# Patient Record
Sex: Female | Born: 1975
Health system: Southern US, Community
[De-identification: ages and names within clinical notes are randomized; demographics above are authoritative.]

## PROBLEM LIST (undated history)

## (undated) DIAGNOSIS — R51 Headache: Secondary | ICD-10-CM

## (undated) DIAGNOSIS — R404 Transient alteration of awareness: Secondary | ICD-10-CM

## (undated) DIAGNOSIS — R519 Headache, unspecified: Secondary | ICD-10-CM

## (undated) DIAGNOSIS — G4730 Sleep apnea, unspecified: Secondary | ICD-10-CM

## (undated) DIAGNOSIS — M797 Fibromyalgia: Secondary | ICD-10-CM

## (undated) DIAGNOSIS — G2569 Other tics of organic origin: Secondary | ICD-10-CM

## (undated) DIAGNOSIS — K635 Polyp of colon: Secondary | ICD-10-CM

## (undated) DIAGNOSIS — Q796 Ehlers-Danlos syndrome, unspecified: Secondary | ICD-10-CM

## (undated) HISTORY — DX: Transient alteration of awareness: R40.4

## (undated) HISTORY — DX: Polyp of colon: K63.5

## (undated) HISTORY — DX: Other tics of organic origin: G25.69

## (undated) HISTORY — DX: Headache, unspecified: R51.9

## (undated) HISTORY — PX: OTHER SURGICAL HISTORY: SHX169

## (undated) HISTORY — DX: Headache: R51

## (undated) HISTORY — PX: TONSILLECTOMY: SUR1361

## (undated) HISTORY — DX: Ehlers-Danlos syndrome, unspecified: Q79.60

---
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
## 1996-08-15 HISTORY — PX: OTHER SURGICAL HISTORY: SHX169

## 1996-08-15 HISTORY — PX: MANDIBLE SURGERY: SHX707

## 1997-12-12 ENCOUNTER — Emergency Department (HOSPITAL_COMMUNITY): Admission: EM | Admit: 1997-12-12 | Discharge: 1997-12-12 | Payer: Self-pay | Admitting: Emergency Medicine

## 1997-12-13 ENCOUNTER — Emergency Department (HOSPITAL_COMMUNITY): Admission: EM | Admit: 1997-12-13 | Discharge: 1997-12-13 | Payer: Self-pay | Admitting: Emergency Medicine

## 1998-04-14 ENCOUNTER — Emergency Department (HOSPITAL_COMMUNITY): Admission: EM | Admit: 1998-04-14 | Discharge: 1998-04-14 | Payer: Self-pay | Admitting: Emergency Medicine

## 1999-07-21 ENCOUNTER — Emergency Department (HOSPITAL_COMMUNITY): Admission: EM | Admit: 1999-07-21 | Discharge: 1999-07-21 | Payer: Self-pay | Admitting: *Deleted

## 1999-10-04 ENCOUNTER — Emergency Department (HOSPITAL_COMMUNITY): Admission: EM | Admit: 1999-10-04 | Discharge: 1999-10-04 | Payer: Self-pay | Admitting: Emergency Medicine

## 2000-03-16 ENCOUNTER — Emergency Department (HOSPITAL_COMMUNITY): Admission: EM | Admit: 2000-03-16 | Discharge: 2000-03-16 | Payer: Self-pay

## 2000-03-17 ENCOUNTER — Other Ambulatory Visit: Admission: RE | Admit: 2000-03-17 | Discharge: 2000-03-30 | Payer: Self-pay

## 2000-04-14 ENCOUNTER — Encounter: Payer: Self-pay | Admitting: Emergency Medicine

## 2000-04-14 ENCOUNTER — Encounter: Payer: Self-pay | Admitting: Family Medicine

## 2000-04-14 ENCOUNTER — Emergency Department (HOSPITAL_COMMUNITY): Admission: EM | Admit: 2000-04-14 | Discharge: 2000-04-14 | Payer: Self-pay | Admitting: Emergency Medicine

## 2000-06-15 ENCOUNTER — Encounter: Payer: Self-pay | Admitting: *Deleted

## 2000-06-15 ENCOUNTER — Ambulatory Visit (HOSPITAL_COMMUNITY): Admission: RE | Admit: 2000-06-15 | Discharge: 2000-06-15 | Payer: Self-pay | Admitting: *Deleted

## 2000-06-27 ENCOUNTER — Emergency Department (HOSPITAL_COMMUNITY): Admission: EM | Admit: 2000-06-27 | Discharge: 2000-06-27 | Payer: Self-pay | Admitting: Emergency Medicine

## 2001-06-24 ENCOUNTER — Emergency Department (HOSPITAL_COMMUNITY): Admission: EM | Admit: 2001-06-24 | Discharge: 2001-06-25 | Payer: Self-pay | Admitting: *Deleted

## 2001-06-24 ENCOUNTER — Encounter: Payer: Self-pay | Admitting: Emergency Medicine

## 2001-06-26 ENCOUNTER — Encounter: Admission: RE | Admit: 2001-06-26 | Discharge: 2001-06-26 | Payer: Self-pay | Admitting: Pediatrics

## 2001-06-26 ENCOUNTER — Encounter: Payer: Self-pay | Admitting: Pediatrics

## 2001-06-27 ENCOUNTER — Emergency Department (HOSPITAL_COMMUNITY): Admission: EM | Admit: 2001-06-27 | Discharge: 2001-06-27 | Payer: Self-pay | Admitting: Emergency Medicine

## 2001-07-05 ENCOUNTER — Encounter: Admission: RE | Admit: 2001-07-05 | Discharge: 2001-07-05 | Payer: Self-pay | Admitting: Pediatrics

## 2001-07-05 ENCOUNTER — Encounter: Payer: Self-pay | Admitting: Pediatrics

## 2001-08-15 HISTORY — PX: LAPAROSCOPY: SHX197

## 2001-11-12 ENCOUNTER — Encounter: Payer: Self-pay | Admitting: Internal Medicine

## 2001-11-12 ENCOUNTER — Inpatient Hospital Stay (HOSPITAL_COMMUNITY): Admission: AD | Admit: 2001-11-12 | Discharge: 2001-11-23 | Payer: Self-pay | Admitting: Internal Medicine

## 2001-11-14 ENCOUNTER — Encounter: Payer: Self-pay | Admitting: Internal Medicine

## 2001-11-14 ENCOUNTER — Encounter: Payer: Self-pay | Admitting: Gastroenterology

## 2002-08-23 ENCOUNTER — Ambulatory Visit (HOSPITAL_COMMUNITY): Admission: RE | Admit: 2002-08-23 | Discharge: 2002-08-23 | Payer: Self-pay | Admitting: Gastroenterology

## 2002-12-13 HISTORY — PX: TONSILLECTOMY: SHX5217

## 2003-11-11 ENCOUNTER — Other Ambulatory Visit: Payer: Self-pay

## 2005-01-12 ENCOUNTER — Emergency Department: Payer: Self-pay | Admitting: Unknown Physician Specialty

## 2005-01-12 ENCOUNTER — Other Ambulatory Visit: Payer: Self-pay

## 2005-01-13 ENCOUNTER — Other Ambulatory Visit: Payer: Self-pay

## 2005-06-13 ENCOUNTER — Ambulatory Visit: Payer: Self-pay | Admitting: Unknown Physician Specialty

## 2005-08-03 ENCOUNTER — Ambulatory Visit: Payer: Self-pay | Admitting: Unknown Physician Specialty

## 2006-11-07 ENCOUNTER — Ambulatory Visit: Payer: Self-pay

## 2007-08-16 HISTORY — PX: DILATION AND CURETTAGE OF UTERUS: SHX78

## 2007-09-21 ENCOUNTER — Ambulatory Visit: Payer: Self-pay | Admitting: Obstetrics and Gynecology

## 2007-10-26 ENCOUNTER — Ambulatory Visit: Payer: Self-pay | Admitting: Obstetrics and Gynecology

## 2007-11-01 ENCOUNTER — Ambulatory Visit: Payer: Self-pay | Admitting: Obstetrics and Gynecology

## 2007-11-09 ENCOUNTER — Ambulatory Visit: Payer: Self-pay | Admitting: Obstetrics and Gynecology

## 2008-01-07 ENCOUNTER — Encounter: Payer: Self-pay | Admitting: Obstetrics and Gynecology

## 2008-01-14 ENCOUNTER — Encounter: Payer: Self-pay | Admitting: Obstetrics and Gynecology

## 2008-02-13 ENCOUNTER — Encounter: Payer: Self-pay | Admitting: Obstetrics and Gynecology

## 2008-03-15 ENCOUNTER — Encounter: Payer: Self-pay | Admitting: Obstetrics and Gynecology

## 2008-04-15 ENCOUNTER — Encounter: Payer: Self-pay | Admitting: Obstetrics and Gynecology

## 2008-10-07 ENCOUNTER — Ambulatory Visit: Payer: Self-pay

## 2008-12-31 ENCOUNTER — Ambulatory Visit: Payer: Self-pay | Admitting: Physical Medicine and Rehabilitation

## 2009-01-13 ENCOUNTER — Ambulatory Visit: Payer: Self-pay | Admitting: Internal Medicine

## 2009-01-28 ENCOUNTER — Ambulatory Visit: Payer: Self-pay | Admitting: Internal Medicine

## 2009-02-12 ENCOUNTER — Ambulatory Visit: Payer: Self-pay | Admitting: Internal Medicine

## 2009-06-25 ENCOUNTER — Encounter: Payer: Self-pay | Admitting: Family Medicine

## 2009-07-15 ENCOUNTER — Encounter: Payer: Self-pay | Admitting: Family Medicine

## 2009-08-15 ENCOUNTER — Encounter: Payer: Self-pay | Admitting: Family Medicine

## 2009-09-15 ENCOUNTER — Encounter: Payer: Self-pay | Admitting: Family Medicine

## 2010-04-15 ENCOUNTER — Ambulatory Visit: Payer: Self-pay | Admitting: Internal Medicine

## 2010-04-26 ENCOUNTER — Ambulatory Visit: Payer: Self-pay | Admitting: Internal Medicine

## 2010-05-15 ENCOUNTER — Ambulatory Visit: Payer: Self-pay | Admitting: Internal Medicine

## 2010-06-04 ENCOUNTER — Emergency Department: Payer: Self-pay | Admitting: Emergency Medicine

## 2010-06-09 ENCOUNTER — Ambulatory Visit: Payer: Self-pay | Admitting: Internal Medicine

## 2010-06-15 ENCOUNTER — Ambulatory Visit: Payer: Self-pay | Admitting: Internal Medicine

## 2010-07-06 ENCOUNTER — Ambulatory Visit: Payer: Self-pay | Admitting: Gastroenterology

## 2010-07-06 LAB — HM COLONOSCOPY: HM Colonoscopy: NORMAL

## 2010-07-09 LAB — PATHOLOGY REPORT

## 2010-07-21 ENCOUNTER — Ambulatory Visit: Payer: Self-pay | Admitting: Internal Medicine

## 2010-08-15 ENCOUNTER — Ambulatory Visit: Payer: Self-pay | Admitting: Internal Medicine

## 2010-09-15 ENCOUNTER — Ambulatory Visit: Payer: Self-pay | Admitting: Internal Medicine

## 2011-10-25 DIAGNOSIS — J019 Acute sinusitis, unspecified: Secondary | ICD-10-CM | POA: Diagnosis not present

## 2011-10-25 DIAGNOSIS — J3089 Other allergic rhinitis: Secondary | ICD-10-CM | POA: Diagnosis not present

## 2011-10-25 DIAGNOSIS — H6500 Acute serous otitis media, unspecified ear: Secondary | ICD-10-CM | POA: Diagnosis not present

## 2011-11-16 DIAGNOSIS — R112 Nausea with vomiting, unspecified: Secondary | ICD-10-CM | POA: Diagnosis not present

## 2011-11-16 DIAGNOSIS — Z87820 Personal history of traumatic brain injury: Secondary | ICD-10-CM | POA: Diagnosis not present

## 2011-11-16 DIAGNOSIS — M545 Low back pain, unspecified: Secondary | ICD-10-CM | POA: Diagnosis not present

## 2011-11-16 DIAGNOSIS — G894 Chronic pain syndrome: Secondary | ICD-10-CM | POA: Diagnosis not present

## 2011-11-16 DIAGNOSIS — Q796 Ehlers-Danlos syndrome, unspecified: Secondary | ICD-10-CM | POA: Diagnosis not present

## 2011-11-16 DIAGNOSIS — IMO0001 Reserved for inherently not codable concepts without codable children: Secondary | ICD-10-CM | POA: Diagnosis not present

## 2011-11-16 DIAGNOSIS — J42 Unspecified chronic bronchitis: Secondary | ICD-10-CM | POA: Diagnosis not present

## 2012-01-20 DIAGNOSIS — J060 Acute laryngopharyngitis: Secondary | ICD-10-CM | POA: Diagnosis not present

## 2012-01-20 DIAGNOSIS — R51 Headache: Secondary | ICD-10-CM | POA: Diagnosis not present

## 2012-01-20 DIAGNOSIS — J029 Acute pharyngitis, unspecified: Secondary | ICD-10-CM | POA: Diagnosis not present

## 2012-03-11 DIAGNOSIS — G8929 Other chronic pain: Secondary | ICD-10-CM | POA: Diagnosis not present

## 2012-03-11 DIAGNOSIS — M545 Low back pain, unspecified: Secondary | ICD-10-CM | POA: Diagnosis not present

## 2012-03-11 DIAGNOSIS — M549 Dorsalgia, unspecified: Secondary | ICD-10-CM | POA: Diagnosis not present

## 2012-03-11 DIAGNOSIS — S335XXA Sprain of ligaments of lumbar spine, initial encounter: Secondary | ICD-10-CM | POA: Diagnosis not present

## 2012-03-11 DIAGNOSIS — S239XXA Sprain of unspecified parts of thorax, initial encounter: Secondary | ICD-10-CM | POA: Diagnosis not present

## 2012-03-12 DIAGNOSIS — M549 Dorsalgia, unspecified: Secondary | ICD-10-CM | POA: Diagnosis not present

## 2012-03-12 DIAGNOSIS — M545 Low back pain, unspecified: Secondary | ICD-10-CM | POA: Diagnosis not present

## 2012-05-03 DIAGNOSIS — Q796 Ehlers-Danlos syndrome, unspecified: Secondary | ICD-10-CM | POA: Diagnosis not present

## 2012-05-03 DIAGNOSIS — IMO0001 Reserved for inherently not codable concepts without codable children: Secondary | ICD-10-CM | POA: Diagnosis not present

## 2012-06-14 DIAGNOSIS — J45909 Unspecified asthma, uncomplicated: Secondary | ICD-10-CM | POA: Diagnosis not present

## 2012-06-14 DIAGNOSIS — Z23 Encounter for immunization: Secondary | ICD-10-CM | POA: Diagnosis not present

## 2012-06-14 DIAGNOSIS — B3731 Acute candidiasis of vulva and vagina: Secondary | ICD-10-CM | POA: Diagnosis not present

## 2012-06-14 DIAGNOSIS — B373 Candidiasis of vulva and vagina: Secondary | ICD-10-CM | POA: Diagnosis not present

## 2012-06-14 DIAGNOSIS — M549 Dorsalgia, unspecified: Secondary | ICD-10-CM | POA: Diagnosis not present

## 2012-06-29 DIAGNOSIS — M47817 Spondylosis without myelopathy or radiculopathy, lumbosacral region: Secondary | ICD-10-CM | POA: Diagnosis not present

## 2012-07-04 DIAGNOSIS — H53029 Refractive amblyopia, unspecified eye: Secondary | ICD-10-CM | POA: Diagnosis not present

## 2012-07-19 DIAGNOSIS — M5137 Other intervertebral disc degeneration, lumbosacral region: Secondary | ICD-10-CM | POA: Diagnosis not present

## 2012-07-19 DIAGNOSIS — M47817 Spondylosis without myelopathy or radiculopathy, lumbosacral region: Secondary | ICD-10-CM | POA: Diagnosis not present

## 2012-08-04 DIAGNOSIS — J019 Acute sinusitis, unspecified: Secondary | ICD-10-CM | POA: Diagnosis not present

## 2012-08-04 DIAGNOSIS — J309 Allergic rhinitis, unspecified: Secondary | ICD-10-CM | POA: Diagnosis not present

## 2012-08-04 DIAGNOSIS — Z23 Encounter for immunization: Secondary | ICD-10-CM | POA: Diagnosis not present

## 2012-08-30 DIAGNOSIS — M479 Spondylosis, unspecified: Secondary | ICD-10-CM | POA: Diagnosis not present

## 2012-09-10 ENCOUNTER — Ambulatory Visit: Payer: Self-pay | Admitting: Pain Medicine

## 2012-09-10 DIAGNOSIS — G894 Chronic pain syndrome: Secondary | ICD-10-CM | POA: Diagnosis not present

## 2012-09-10 DIAGNOSIS — Z79899 Other long term (current) drug therapy: Secondary | ICD-10-CM | POA: Diagnosis not present

## 2012-09-10 DIAGNOSIS — F449 Dissociative and conversion disorder, unspecified: Secondary | ICD-10-CM | POA: Diagnosis not present

## 2012-09-18 ENCOUNTER — Ambulatory Visit: Payer: Self-pay | Admitting: Pain Medicine

## 2012-09-20 ENCOUNTER — Ambulatory Visit: Payer: Self-pay | Admitting: Pain Medicine

## 2012-10-09 ENCOUNTER — Emergency Department: Payer: Self-pay | Admitting: Emergency Medicine

## 2012-10-17 DIAGNOSIS — G4700 Insomnia, unspecified: Secondary | ICD-10-CM | POA: Diagnosis not present

## 2012-10-17 DIAGNOSIS — R109 Unspecified abdominal pain: Secondary | ICD-10-CM | POA: Diagnosis not present

## 2012-10-17 DIAGNOSIS — Z23 Encounter for immunization: Secondary | ICD-10-CM | POA: Diagnosis not present

## 2012-10-24 DIAGNOSIS — R109 Unspecified abdominal pain: Secondary | ICD-10-CM | POA: Diagnosis not present

## 2012-11-01 DIAGNOSIS — IMO0002 Reserved for concepts with insufficient information to code with codable children: Secondary | ICD-10-CM | POA: Diagnosis not present

## 2012-11-05 ENCOUNTER — Emergency Department: Payer: Self-pay | Admitting: Emergency Medicine

## 2012-11-05 LAB — CBC
HCT: 42.5 % (ref 35.0–47.0)
HGB: 14.2 g/dL (ref 12.0–16.0)
MCH: 32.2 pg (ref 26.0–34.0)
MCHC: 33.5 g/dL (ref 32.0–36.0)
MCV: 96 fL (ref 80–100)
Platelet: 355 10*3/uL (ref 150–440)
RBC: 4.41 10*6/uL (ref 3.80–5.20)
RDW: 13.6 % (ref 11.5–14.5)
WBC: 12.5 10*3/uL — ABNORMAL HIGH (ref 3.6–11.0)

## 2012-11-05 LAB — COMPREHENSIVE METABOLIC PANEL
Albumin: 3.9 g/dL (ref 3.4–5.0)
Alkaline Phosphatase: 94 U/L (ref 50–136)
Anion Gap: 6 — ABNORMAL LOW (ref 7–16)
BUN: 7 mg/dL (ref 7–18)
Bilirubin,Total: 0.3 mg/dL (ref 0.2–1.0)
Calcium, Total: 8.3 mg/dL — ABNORMAL LOW (ref 8.5–10.1)
Chloride: 107 mmol/L (ref 98–107)
Co2: 24 mmol/L (ref 21–32)
Creatinine: 0.72 mg/dL (ref 0.60–1.30)
EGFR (African American): >60
EGFR (Non-African Amer.): >60
Glucose: 98 mg/dL (ref 65–99)
Osmolality: 272 (ref 275–301)
Potassium: 3.5 mmol/L (ref 3.5–5.1)
SGOT(AST): 20 U/L (ref 15–37)
SGPT (ALT): 26 U/L (ref 12–78)
Sodium: 137 mmol/L (ref 136–145)
Total Protein: 7.6 g/dL (ref 6.4–8.2)

## 2012-11-05 LAB — URINALYSIS, COMPLETE
Bilirubin,UR: NEGATIVE
Blood: NEGATIVE
Glucose,UR: NEGATIVE mg/dL (ref 0–75)
Ketone: NEGATIVE
Leukocyte Esterase: NEGATIVE
Nitrite: NEGATIVE
Ph: 7 (ref 4.5–8.0)
Protein: NEGATIVE
RBC,UR: 1 /HPF (ref 0–5)
Specific Gravity: 1.003 (ref 1.003–1.030)
Squamous Epithelial: 1
WBC UR: 4 /HPF (ref 0–5)

## 2012-11-05 LAB — LIPASE, BLOOD: Lipase: 357 U/L (ref 73–393)

## 2012-11-12 ENCOUNTER — Ambulatory Visit: Payer: Self-pay | Admitting: Gastroenterology

## 2012-11-14 LAB — PATHOLOGY REPORT

## 2012-11-15 DIAGNOSIS — J309 Allergic rhinitis, unspecified: Secondary | ICD-10-CM | POA: Diagnosis not present

## 2012-11-15 DIAGNOSIS — N3000 Acute cystitis without hematuria: Secondary | ICD-10-CM | POA: Diagnosis not present

## 2012-11-15 DIAGNOSIS — Z23 Encounter for immunization: Secondary | ICD-10-CM | POA: Diagnosis not present

## 2012-11-21 ENCOUNTER — Observation Stay: Payer: Self-pay | Admitting: Internal Medicine

## 2012-11-21 DIAGNOSIS — R6889 Other general symptoms and signs: Secondary | ICD-10-CM | POA: Diagnosis not present

## 2012-11-21 LAB — URINALYSIS, COMPLETE
Bacteria: NONE SEEN
Bilirubin,UR: NEGATIVE
Blood: NEGATIVE
Glucose,UR: NEGATIVE mg/dL (ref 0–75)
Hyaline Cast: 1
Ketone: NEGATIVE
Leukocyte Esterase: NEGATIVE
Nitrite: NEGATIVE
Ph: 6 (ref 4.5–8.0)
Protein: NEGATIVE
RBC,UR: 2 /HPF (ref 0–5)
Specific Gravity: 1.031 (ref 1.003–1.030)
Squamous Epithelial: 1
WBC UR: 1 /HPF (ref 0–5)

## 2012-11-21 LAB — COMPREHENSIVE METABOLIC PANEL
Albumin: 3.9 g/dL (ref 3.4–5.0)
Alkaline Phosphatase: 91 U/L (ref 50–136)
Anion Gap: 8 (ref 7–16)
BUN: 23 mg/dL — ABNORMAL HIGH (ref 7–18)
Bilirubin,Total: 0.3 mg/dL (ref 0.2–1.0)
Calcium, Total: 9.2 mg/dL (ref 8.5–10.1)
Chloride: 108 mmol/L — ABNORMAL HIGH (ref 98–107)
Co2: 25 mmol/L (ref 21–32)
Creatinine: 0.84 mg/dL (ref 0.60–1.30)
EGFR (African American): >60
EGFR (Non-African Amer.): >60
Glucose: 128 mg/dL — ABNORMAL HIGH (ref 65–99)
Osmolality: 287 (ref 275–301)
Potassium: 3.5 mmol/L (ref 3.5–5.1)
SGOT(AST): 45 U/L — ABNORMAL HIGH (ref 15–37)
SGPT (ALT): 69 U/L (ref 12–78)
Sodium: 141 mmol/L (ref 136–145)
Total Protein: 7.4 g/dL (ref 6.4–8.2)

## 2012-11-21 LAB — CBC
HCT: 46.3 % (ref 35.0–47.0)
HGB: 15.6 g/dL (ref 12.0–16.0)
MCH: 32.6 pg (ref 26.0–34.0)
MCHC: 33.8 g/dL (ref 32.0–36.0)
MCV: 96 fL (ref 80–100)
Platelet: 317 10*3/uL (ref 150–440)
RBC: 4.81 10*6/uL (ref 3.80–5.20)
RDW: 13.8 % (ref 11.5–14.5)
WBC: 4.4 10*3/uL (ref 3.6–11.0)

## 2012-11-21 LAB — CK TOTAL AND CKMB (NOT AT ARMC)
CK, Total: 60 U/L (ref 21–215)
CK-MB: <0.5 ng/mL — ABNORMAL LOW (ref 0.5–3.6)

## 2012-11-21 LAB — LIPASE, BLOOD: Lipase: 395 U/L — ABNORMAL HIGH (ref 73–393)

## 2012-11-21 LAB — TROPONIN I
Troponin-I: <0.02 ng/mL
Troponin-I: <0.02 ng/mL

## 2012-11-22 LAB — CBC WITH DIFFERENTIAL/PLATELET
Basophil #: 0 10*3/uL (ref 0.0–0.1)
Basophil %: 0.2 %
Eosinophil #: 0 10*3/uL (ref 0.0–0.7)
Eosinophil %: 0.5 %
HCT: 36.2 % (ref 35.0–47.0)
HGB: 12.3 g/dL (ref 12.0–16.0)
Lymphocyte #: 0.5 10*3/uL — ABNORMAL LOW (ref 1.0–3.6)
Lymphocyte %: 15.5 %
MCH: 32.6 pg (ref 26.0–34.0)
MCHC: 33.8 g/dL (ref 32.0–36.0)
MCV: 96 fL (ref 80–100)
Monocyte #: 0.3 x10 3/mm (ref 0.2–0.9)
Monocyte %: 8.8 %
Neutrophil #: 2.6 10*3/uL (ref 1.4–6.5)
Neutrophil %: 75 %
Platelet: 231 10*3/uL (ref 150–440)
RBC: 3.76 10*6/uL — ABNORMAL LOW (ref 3.80–5.20)
RDW: 13.6 % (ref 11.5–14.5)
WBC: 3.5 10*3/uL — ABNORMAL LOW (ref 3.6–11.0)

## 2012-11-22 LAB — BASIC METABOLIC PANEL
Anion Gap: 8 (ref 7–16)
BUN: 10 mg/dL (ref 7–18)
Calcium, Total: 7.9 mg/dL — ABNORMAL LOW (ref 8.5–10.1)
Chloride: 108 mmol/L — ABNORMAL HIGH (ref 98–107)
Co2: 24 mmol/L (ref 21–32)
Creatinine: 0.91 mg/dL (ref 0.60–1.30)
EGFR (African American): >60
EGFR (Non-African Amer.): >60
Glucose: 126 mg/dL — ABNORMAL HIGH (ref 65–99)
Osmolality: 280 (ref 275–301)
Potassium: 3.7 mmol/L (ref 3.5–5.1)
Sodium: 140 mmol/L (ref 136–145)

## 2012-11-23 LAB — LIPASE, BLOOD: Lipase: 81 U/L (ref 73–393)

## 2012-12-05 ENCOUNTER — Emergency Department: Payer: Self-pay

## 2012-12-05 LAB — CBC
HCT: 40.4 % (ref 35.0–47.0)
HGB: 13.6 g/dL (ref 12.0–16.0)
MCH: 31.8 pg (ref 26.0–34.0)
MCHC: 33.6 g/dL (ref 32.0–36.0)
MCV: 95 fL (ref 80–100)
Platelet: 393 10*3/uL (ref 150–440)
RBC: 4.27 10*6/uL (ref 3.80–5.20)
RDW: 13.7 % (ref 11.5–14.5)
WBC: 10.2 10*3/uL (ref 3.6–11.0)

## 2012-12-05 LAB — URINALYSIS, COMPLETE
Bilirubin,UR: NEGATIVE
Blood: NEGATIVE
Glucose,UR: NEGATIVE mg/dL (ref 0–75)
Leukocyte Esterase: NEGATIVE
Nitrite: NEGATIVE
Ph: 6 (ref 4.5–8.0)
Protein: NEGATIVE
RBC,UR: 3 /HPF (ref 0–5)
Specific Gravity: 1.016 (ref 1.003–1.030)
Squamous Epithelial: 5
WBC UR: 4 /HPF (ref 0–5)

## 2012-12-05 LAB — COMPREHENSIVE METABOLIC PANEL
Albumin: 3.9 g/dL (ref 3.4–5.0)
Alkaline Phosphatase: 88 U/L (ref 50–136)
Anion Gap: 7 (ref 7–16)
BUN: 13 mg/dL (ref 7–18)
Bilirubin,Total: 0.5 mg/dL (ref 0.2–1.0)
Calcium, Total: 9.2 mg/dL (ref 8.5–10.1)
Chloride: 105 mmol/L (ref 98–107)
Co2: 27 mmol/L (ref 21–32)
Creatinine: 0.52 mg/dL — ABNORMAL LOW (ref 0.60–1.30)
EGFR (African American): >60
EGFR (Non-African Amer.): >60
Glucose: 95 mg/dL (ref 65–99)
Osmolality: 277 (ref 275–301)
Potassium: 3.4 mmol/L — ABNORMAL LOW (ref 3.5–5.1)
SGOT(AST): 21 U/L (ref 15–37)
SGPT (ALT): 25 U/L (ref 12–78)
Sodium: 139 mmol/L (ref 136–145)
Total Protein: 7.3 g/dL (ref 6.4–8.2)

## 2012-12-05 LAB — LIPASE, BLOOD: Lipase: 297 U/L (ref 73–393)

## 2012-12-14 ENCOUNTER — Ambulatory Visit: Payer: Self-pay | Admitting: Unknown Physician Specialty

## 2013-01-08 ENCOUNTER — Ambulatory Visit: Payer: Self-pay | Admitting: Gastroenterology

## 2013-06-18 DIAGNOSIS — IMO0001 Reserved for inherently not codable concepts without codable children: Secondary | ICD-10-CM | POA: Diagnosis not present

## 2013-06-26 ENCOUNTER — Encounter: Payer: Self-pay | Admitting: Family Medicine

## 2013-06-26 DIAGNOSIS — R262 Difficulty in walking, not elsewhere classified: Secondary | ICD-10-CM | POA: Diagnosis not present

## 2013-06-26 DIAGNOSIS — Q796 Ehlers-Danlos syndrome, unspecified: Secondary | ICD-10-CM | POA: Diagnosis not present

## 2013-06-26 DIAGNOSIS — M6281 Muscle weakness (generalized): Secondary | ICD-10-CM | POA: Diagnosis not present

## 2013-07-04 DIAGNOSIS — M5137 Other intervertebral disc degeneration, lumbosacral region: Secondary | ICD-10-CM | POA: Diagnosis not present

## 2013-07-04 DIAGNOSIS — M47817 Spondylosis without myelopathy or radiculopathy, lumbosacral region: Secondary | ICD-10-CM | POA: Diagnosis not present

## 2013-07-23 DIAGNOSIS — M069 Rheumatoid arthritis, unspecified: Secondary | ICD-10-CM | POA: Diagnosis not present

## 2013-07-23 DIAGNOSIS — D649 Anemia, unspecified: Secondary | ICD-10-CM | POA: Diagnosis not present

## 2013-07-23 DIAGNOSIS — Q796 Ehlers-Danlos syndrome, unspecified: Secondary | ICD-10-CM | POA: Diagnosis not present

## 2013-07-23 DIAGNOSIS — G40909 Epilepsy, unspecified, not intractable, without status epilepticus: Secondary | ICD-10-CM | POA: Diagnosis not present

## 2013-10-14 DIAGNOSIS — H903 Sensorineural hearing loss, bilateral: Secondary | ICD-10-CM | POA: Diagnosis not present

## 2013-10-14 DIAGNOSIS — J342 Deviated nasal septum: Secondary | ICD-10-CM | POA: Diagnosis not present

## 2013-10-14 DIAGNOSIS — J328 Other chronic sinusitis: Secondary | ICD-10-CM | POA: Diagnosis not present

## 2013-10-14 DIAGNOSIS — H905 Unspecified sensorineural hearing loss: Secondary | ICD-10-CM | POA: Diagnosis not present

## 2013-10-14 DIAGNOSIS — J343 Hypertrophy of nasal turbinates: Secondary | ICD-10-CM | POA: Diagnosis not present

## 2013-11-21 DIAGNOSIS — M79609 Pain in unspecified limb: Secondary | ICD-10-CM | POA: Diagnosis not present

## 2013-11-21 DIAGNOSIS — M25559 Pain in unspecified hip: Secondary | ICD-10-CM | POA: Diagnosis not present

## 2013-11-21 DIAGNOSIS — M25519 Pain in unspecified shoulder: Secondary | ICD-10-CM | POA: Diagnosis not present

## 2013-12-17 DIAGNOSIS — F3289 Other specified depressive episodes: Secondary | ICD-10-CM | POA: Diagnosis not present

## 2013-12-17 DIAGNOSIS — J019 Acute sinusitis, unspecified: Secondary | ICD-10-CM | POA: Diagnosis not present

## 2013-12-17 DIAGNOSIS — M549 Dorsalgia, unspecified: Secondary | ICD-10-CM | POA: Diagnosis not present

## 2013-12-17 DIAGNOSIS — D649 Anemia, unspecified: Secondary | ICD-10-CM | POA: Diagnosis not present

## 2013-12-17 DIAGNOSIS — F329 Major depressive disorder, single episode, unspecified: Secondary | ICD-10-CM | POA: Diagnosis not present

## 2013-12-17 DIAGNOSIS — F411 Generalized anxiety disorder: Secondary | ICD-10-CM | POA: Diagnosis not present

## 2013-12-17 DIAGNOSIS — L659 Nonscarring hair loss, unspecified: Secondary | ICD-10-CM | POA: Diagnosis not present

## 2013-12-19 DIAGNOSIS — J328 Other chronic sinusitis: Secondary | ICD-10-CM | POA: Diagnosis not present

## 2013-12-19 DIAGNOSIS — J343 Hypertrophy of nasal turbinates: Secondary | ICD-10-CM | POA: Diagnosis not present

## 2013-12-20 DIAGNOSIS — M76899 Other specified enthesopathies of unspecified lower limb, excluding foot: Secondary | ICD-10-CM | POA: Diagnosis not present

## 2013-12-20 DIAGNOSIS — M47817 Spondylosis without myelopathy or radiculopathy, lumbosacral region: Secondary | ICD-10-CM | POA: Diagnosis not present

## 2013-12-27 DIAGNOSIS — L659 Nonscarring hair loss, unspecified: Secondary | ICD-10-CM | POA: Diagnosis not present

## 2013-12-27 DIAGNOSIS — D649 Anemia, unspecified: Secondary | ICD-10-CM | POA: Diagnosis not present

## 2014-01-07 DIAGNOSIS — G2569 Other tics of organic origin: Secondary | ICD-10-CM | POA: Insufficient documentation

## 2014-01-07 DIAGNOSIS — R404 Transient alteration of awareness: Secondary | ICD-10-CM | POA: Insufficient documentation

## 2014-01-07 DIAGNOSIS — M549 Dorsalgia, unspecified: Secondary | ICD-10-CM | POA: Insufficient documentation

## 2014-01-07 DIAGNOSIS — Q796 Ehlers-Danlos syndrome, unspecified: Secondary | ICD-10-CM

## 2014-01-27 ENCOUNTER — Encounter: Payer: Self-pay | Admitting: Pediatrics

## 2014-01-27 ENCOUNTER — Ambulatory Visit (INDEPENDENT_AMBULATORY_CARE_PROVIDER_SITE_OTHER): Payer: Medicare Other | Admitting: Pediatrics

## 2014-01-27 VITALS — BP 102/66 | HR 72 | Ht 59.5 in | Wt 128.7 lb

## 2014-01-27 DIAGNOSIS — G2569 Other tics of organic origin: Secondary | ICD-10-CM

## 2014-01-27 DIAGNOSIS — G43009 Migraine without aura, not intractable, without status migrainosus: Secondary | ICD-10-CM | POA: Insufficient documentation

## 2014-01-27 DIAGNOSIS — Z79899 Other long term (current) drug therapy: Secondary | ICD-10-CM

## 2014-01-27 DIAGNOSIS — Q796 Ehlers-Danlos syndrome, unspecified: Secondary | ICD-10-CM

## 2014-01-27 DIAGNOSIS — IMO0001 Reserved for inherently not codable concepts without codable children: Secondary | ICD-10-CM | POA: Diagnosis not present

## 2014-01-27 DIAGNOSIS — M549 Dorsalgia, unspecified: Secondary | ICD-10-CM

## 2014-01-27 DIAGNOSIS — M797 Fibromyalgia: Secondary | ICD-10-CM

## 2014-01-27 MED ORDER — HALOPERIDOL 0.5 MG PO TABS: ORAL_TABLET | ORAL | Status: DC
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
## 2014-02-18 DIAGNOSIS — E162 Hypoglycemia, unspecified: Secondary | ICD-10-CM | POA: Diagnosis not present

## 2014-02-18 DIAGNOSIS — F959 Tic disorder, unspecified: Secondary | ICD-10-CM | POA: Diagnosis not present

## 2014-02-18 DIAGNOSIS — R569 Unspecified convulsions: Secondary | ICD-10-CM | POA: Diagnosis not present

## 2014-02-19 ENCOUNTER — Emergency Department: Payer: Self-pay | Admitting: Emergency Medicine

## 2014-02-19 DIAGNOSIS — Z888 Allergy status to other drugs, medicaments and biological substances status: Secondary | ICD-10-CM | POA: Diagnosis not present

## 2014-02-19 DIAGNOSIS — R569 Unspecified convulsions: Secondary | ICD-10-CM | POA: Diagnosis not present

## 2014-02-19 LAB — COMPREHENSIVE METABOLIC PANEL
Albumin: 3.6 g/dL (ref 3.4–5.0)
Alkaline Phosphatase: 73 U/L
Anion Gap: 6 — ABNORMAL LOW (ref 7–16)
BUN: 9 mg/dL (ref 7–18)
Bilirubin,Total: 0.2 mg/dL (ref 0.2–1.0)
Calcium, Total: 9 mg/dL (ref 8.5–10.1)
Chloride: 105 mmol/L (ref 98–107)
Co2: 29 mmol/L (ref 21–32)
Creatinine: 0.82 mg/dL (ref 0.60–1.30)
EGFR (African American): >60
EGFR (Non-African Amer.): >60
Glucose: 86 mg/dL (ref 65–99)
Osmolality: 277 (ref 275–301)
Potassium: 3.9 mmol/L (ref 3.5–5.1)
SGOT(AST): 20 U/L (ref 15–37)
SGPT (ALT): 27 U/L (ref 12–78)
Sodium: 140 mmol/L (ref 136–145)
Total Protein: 6.7 g/dL (ref 6.4–8.2)

## 2014-02-19 LAB — CBC WITH DIFFERENTIAL/PLATELET
Basophil #: 0.1 10*3/uL (ref 0.0–0.1)
Basophil %: 1 %
Eosinophil #: 0.9 10*3/uL — ABNORMAL HIGH (ref 0.0–0.7)
Eosinophil %: 12.2 %
HCT: 42 % (ref 35.0–47.0)
HGB: 13.6 g/dL (ref 12.0–16.0)
Lymphocyte #: 3.5 10*3/uL (ref 1.0–3.6)
Lymphocyte %: 46.7 %
MCH: 29.6 pg (ref 26.0–34.0)
MCHC: 32.2 g/dL (ref 32.0–36.0)
MCV: 92 fL (ref 80–100)
Monocyte #: 0.5 x10 3/mm (ref 0.2–0.9)
Monocyte %: 6.1 %
Neutrophil #: 2.5 10*3/uL (ref 1.4–6.5)
Neutrophil %: 34 %
Platelet: 255 10*3/uL (ref 150–440)
RBC: 4.57 10*6/uL (ref 3.80–5.20)
RDW: 13.8 % (ref 11.5–14.5)
WBC: 7.5 10*3/uL (ref 3.6–11.0)

## 2014-02-19 LAB — URINALYSIS, COMPLETE
Bilirubin,UR: NEGATIVE
Blood: NEGATIVE
Glucose,UR: NEGATIVE mg/dL (ref 0–75)
Ketone: NEGATIVE
Leukocyte Esterase: NEGATIVE
Nitrite: NEGATIVE
Ph: 6 (ref 4.5–8.0)
Protein: NEGATIVE
RBC,UR: NONE SEEN /HPF (ref 0–5)
Specific Gravity: 1.004 (ref 1.003–1.030)
Squamous Epithelial: 9
WBC UR: <1 /HPF (ref 0–5)

## 2014-03-03 DIAGNOSIS — M549 Dorsalgia, unspecified: Secondary | ICD-10-CM | POA: Diagnosis not present

## 2014-03-18 DIAGNOSIS — J329 Chronic sinusitis, unspecified: Secondary | ICD-10-CM | POA: Diagnosis not present

## 2014-03-18 DIAGNOSIS — J309 Allergic rhinitis, unspecified: Secondary | ICD-10-CM | POA: Diagnosis not present

## 2014-03-18 DIAGNOSIS — R404 Transient alteration of awareness: Secondary | ICD-10-CM | POA: Diagnosis not present

## 2014-03-18 DIAGNOSIS — F959 Tic disorder, unspecified: Secondary | ICD-10-CM | POA: Diagnosis not present

## 2014-03-20 DIAGNOSIS — M47814 Spondylosis without myelopathy or radiculopathy, thoracic region: Secondary | ICD-10-CM | POA: Diagnosis not present

## 2014-03-20 DIAGNOSIS — M47817 Spondylosis without myelopathy or radiculopathy, lumbosacral region: Secondary | ICD-10-CM | POA: Diagnosis not present

## 2014-04-04 DIAGNOSIS — F329 Major depressive disorder, single episode, unspecified: Secondary | ICD-10-CM | POA: Diagnosis not present

## 2014-04-04 DIAGNOSIS — F3289 Other specified depressive episodes: Secondary | ICD-10-CM | POA: Diagnosis not present

## 2014-04-04 DIAGNOSIS — M549 Dorsalgia, unspecified: Secondary | ICD-10-CM | POA: Diagnosis not present

## 2014-04-04 DIAGNOSIS — R1013 Epigastric pain: Secondary | ICD-10-CM | POA: Diagnosis not present

## 2014-04-04 DIAGNOSIS — D649 Anemia, unspecified: Secondary | ICD-10-CM | POA: Diagnosis not present

## 2014-04-04 DIAGNOSIS — F411 Generalized anxiety disorder: Secondary | ICD-10-CM | POA: Diagnosis not present

## 2014-04-04 DIAGNOSIS — R109 Unspecified abdominal pain: Secondary | ICD-10-CM | POA: Diagnosis not present

## 2014-04-04 DIAGNOSIS — J019 Acute sinusitis, unspecified: Secondary | ICD-10-CM | POA: Diagnosis not present

## 2014-04-04 LAB — BASIC METABOLIC PANEL
BUN: 11 mg/dL (ref 4–21)
Creatinine: 0.7 mg/dL (ref 0.5–1.1)
Glucose: 102 mg/dL
Potassium: 3.6 mmol/L (ref 3.4–5.3)
Sodium: 138 mmol/L (ref 137–147)

## 2014-04-04 LAB — HEPATIC FUNCTION PANEL
ALT: 14 U/L (ref 7–35)
AST: 17 U/L (ref 13–35)

## 2014-04-04 LAB — CBC AND DIFFERENTIAL
HCT: 38 % (ref 36–46)
Hemoglobin: 13.1 g/dL (ref 12.0–16.0)
Platelets: 374 10*3/uL (ref 150–399)
WBC: 7.5 10^3/mL

## 2014-04-07 DIAGNOSIS — IMO0001 Reserved for inherently not codable concepts without codable children: Secondary | ICD-10-CM | POA: Diagnosis not present

## 2014-05-01 DIAGNOSIS — M47817 Spondylosis without myelopathy or radiculopathy, lumbosacral region: Secondary | ICD-10-CM | POA: Diagnosis not present

## 2014-08-16 ENCOUNTER — Other Ambulatory Visit: Payer: Self-pay | Admitting: Pediatrics

## 2014-08-18 ENCOUNTER — Encounter: Payer: Self-pay | Admitting: Pediatrics

## 2014-09-17 DIAGNOSIS — M791 Myalgia: Secondary | ICD-10-CM | POA: Diagnosis not present

## 2014-09-18 ENCOUNTER — Encounter: Payer: Self-pay | Admitting: Pediatrics

## 2014-09-18 ENCOUNTER — Ambulatory Visit (INDEPENDENT_AMBULATORY_CARE_PROVIDER_SITE_OTHER): Payer: Medicare Other | Admitting: Pediatrics

## 2014-09-18 VITALS — BP 90/64 | HR 84 | Ht 59.5 in | Wt 140.2 lb

## 2014-09-18 DIAGNOSIS — Z79899 Other long term (current) drug therapy: Secondary | ICD-10-CM

## 2014-09-18 DIAGNOSIS — G2569 Other tics of organic origin: Secondary | ICD-10-CM | POA: Diagnosis not present

## 2014-09-18 DIAGNOSIS — Q796 Ehlers-Danlos syndrome, unspecified: Secondary | ICD-10-CM

## 2014-09-18 DIAGNOSIS — M797 Fibromyalgia: Secondary | ICD-10-CM

## 2014-09-18 DIAGNOSIS — G43009 Migraine without aura, not intractable, without status migrainosus: Secondary | ICD-10-CM

## 2014-09-18 MED ORDER — HALOPERIDOL 0.5 MG PO TABS: ORAL_TABLET | ORAL | Status: DC
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
## 2014-09-24 DIAGNOSIS — M549 Dorsalgia, unspecified: Secondary | ICD-10-CM | POA: Diagnosis not present

## 2014-09-24 DIAGNOSIS — F329 Major depressive disorder, single episode, unspecified: Secondary | ICD-10-CM | POA: Diagnosis not present

## 2014-10-22 ENCOUNTER — Telehealth: Payer: Self-pay

## 2014-10-22 DIAGNOSIS — G2569 Other tics of organic origin: Secondary | ICD-10-CM

## 2014-10-22 MED ORDER — HALOPERIDOL 0.5 MG PO TABS: ORAL_TABLET | ORAL | Status: DC

## 2014-10-22 NOTE — Telephone Encounter (Signed)
Karen [name] called and stated that she would like an increase in her Haloperidol 0.5 mg tabs to 1/2 tab po TID. She currently takes 1/2 tab BID. The increase was discussed at pt's last office visit with Dr. Gaynell Face on 09/18/14. I let her know that I will speak to Dr. Lemmie Evens and let her know if/when the new Rx would be sent to the pharmacy. I did confirm pharmacy with her. Wynter can be reached at: [phone].

## 2014-10-22 NOTE — Telephone Encounter (Signed)
I have electronically sent a prescription, please let Vanita know.

## 2014-10-22 NOTE — Telephone Encounter (Signed)
Called Karen [name] and let her know the Rx was sent to the pharmacy as requested.

## 2014-11-11 DIAGNOSIS — R209 Unspecified disturbances of skin sensation: Secondary | ICD-10-CM | POA: Diagnosis not present

## 2014-11-11 LAB — TSH: TSH: 1.15 u[IU]/mL (ref <=5.90)

## 2014-11-24 DIAGNOSIS — F329 Major depressive disorder, single episode, unspecified: Secondary | ICD-10-CM | POA: Diagnosis not present

## 2014-11-24 DIAGNOSIS — R209 Unspecified disturbances of skin sensation: Secondary | ICD-10-CM | POA: Diagnosis not present

## 2014-12-05 DIAGNOSIS — M791 Myalgia: Secondary | ICD-10-CM | POA: Diagnosis not present

## 2014-12-05 NOTE — H&P (Signed)
PATIENT NAME:  Karen [name], Karen [name] MR#:  [id number] DATE OF BIRTH:  07-04-1976  DATE OF ADMISSION:  11/21/2012  CHIEF COMPLAINT: Nausea, vomiting, diarrhea.   PRIMARY CARE PHYSICIAN: Kirstie Peri. Caryn Section, MD  GASTROENTEROLOGIST: Lollie Sails, MD  HISTORY OF PRESENT ILLNESS: This is a 39 year old female patient with history of Ehlers-Danlos syndrome since age 23, history of depression, peptic ulcer disease, asthma. Came in because of nausea, vomiting, abdominal pain. Nausea and vomiting started last night, had about 5 to 6 times of nausea and vomiting and had diarrhea since she is here in the Emergency Room. Had 5 episodes of diarrhea. Complains of epigastric pain in the mid epigastric region, nonradiating in type. The patient has associated diarrhea and nausea and vomiting. Diarrhea is mainly liquid stool, no odor to it, and vomiting is mainly liquid brown stuff, no blood in the vomiting. The patient received multiple doses of Zofran in the Emergency Room without relief, so we are asked to admit the patient.   PAST MEDICAL HISTORY: Significant for peptic ulcer disease. Follows up with Dr. Gustavo Lah. Had EGD done last week according to her, and she needs to be on PPIs and told she has peptic ulcer disease. She is also on low-fiber diet. Other history includes history of asthma, depression and muscle spasms.   ALLERGIES: SHE IS ALLERGIC TO DEMEROL, KEFLEX, KETOROLAC, MORPHINE, CHOCOLATE, STRAWBERRY AND TAPE.   SOCIAL HISTORY: The patient does not smoke or drink. Lives with mother and she is wheelchair-bound.  MEDICATIONS: She is on multiple medications at home.  1.  She takes Advair Diskus 250/50, 1 puff b.i.d. 2.  Albuterol nebulizer as needed. 3.  Baclofen 10 mg t.i.d. 4.  BuSpar 10 mg p.o. b.i.d. 5.  Camila 0.35 mg once a day.  6.  Celebrex 200 mg p.o. b.i.d.  7.  Diphenhydramine as needed.  8.  Doxycycline 100 mg p.o. b.i.d.  9.  Allegra 180 mg p.o.  10.  Flonase 50 mcg 2 sprays once a  day. 11.  Neurontin 600 mg 2 to 3 tablets p.o. b.i.d.  12.  Pantoprazole 40 mg p.o. b.i.d.  13.  Oxycodone 10 mg p.o. every 6 hours.  14.  Haldol 0.5 mg p.o. t.i.d.  15.  Sucralfate 1 gram p.o. 4 times daily.  16.  Tramadol 150 mg p.o. t.i.d.  17.  Rabeprazole 20 mg extended-release 2 times daily. 18.  Seroquel 200 mg p.o. at bedtime.  19.  ProAir.  20.  Promethazine as needed for nausea.  21.  Voltaren gel to affected area 4 times daily.   PAST SURGICAL HISTORY: Significant for:  1.  History of 3 eye operations when she was young. 2.  Also history of laparoscopy.  3.  History of right arm surgery due to history of accident when she was small.   FAMILY HISTORY: No history of hypertension or diabetes. No Ehlers-Danlos syndrome.  REVIEW OF SYSTEMS:  CONSTITUTIONAL: The patient has some fatigue.  EYES: No blurred vision.  EARS, NOSE, THROAT: No ear pain. No epistaxis or difficulty swallowing.  CARDIOVASCULAR: No chest pain. RESPIRATORY: No shortness of breath. No wheezing.  GASTROINTESTINAL: Diarrhea started this morning but had nausea, vomiting, abdominal pain since last night.  EXTREMITIES: The patient denies any leg pain or ankle pain.  PSYCHOLOGIC: The patient has some anxiety.  NEUROLOGIC: Denies any TIAs or strokes.   PHYSICAL EXAMINATION:  VITAL SIGNS: Temperature 98.5, heart rate 120s, respirations 20, blood pressure initially 91/64. GENERAL: Alert, awake, oriented 39 year old obese female,  not in distress. Answering questions appropriately. Appearing slightly anxious.  HEENT: Head atraumatic, normocephalic. Pupils equally reacting to light. Extraocular muscles intact. No tympanic membrane congestion. No turbinate hypertrophy. No oropharyngeal erythema.  NECK: Normal range of motion. No JVD. No carotid bruit.  CARDIOVASCULAR: S1, S2 regular. No murmurs.  LUNGS: Clear to auscultation. No wheeze. No rales.  ABDOMEN: Epigastric tenderness present. Bowel sounds present. Abdomen  is slightly distended. No organomegaly. No hernias. The patient has no CVA tenderness.  EXTREMITIES: No extremity edema. No cyanosis. No clubbing.  NEUROLOGIC: Alert, awake, oriented. Cranial nerves II through XII intact. Power 5/5 in upper and lower extremities. Sensory intact. DTRs 2+ bilaterally.  PSYCHIATRIC: Appearing slightly anxious. Mood and affect are within normal limits.   LABORATORY, DIAGNOSTIC AND RADIOLOGICAL DATA: Troponin less than 0.02. CT with PE protocol did not show any PE, ascending colon distended with fluid, transverse colon also distended with stool, nonspecific. Abdominal x-ray showed ileus pattern, no evidence of perforation. Pregnancy test is negative. UA is clear, no leukocyte esterase, bacteria none. WBC 4.4, hemoglobin 15.6, hematocrit 46.2, platelets 317. Electrolytes: Sodium is 141, potassium 3.8, chloride 108, bicarbonate 25, BUN 23, creatinine 0.8, blood glucose 128. LFTs are within normal limits. Lipase is slightly up at 395. EKG shows sinus tach at 116 beats per minute.   ASSESSMENT AND PLAN: [name] patient with:  1.  Epigastric pain, nausea, vomiting. Symptoms likely probably due to gastritis given the history of peptic ulcer disease. The patient also can have viral gastroenteritis with nausea, vomiting, diarrhea too. We are going to admit her to hospital under observation status. Continue intravenous fluids, normal saline at 80 per hour. Continue intravenous proton pump inhibitor with Protonix 40 mg intravenously daily. Continue sucralfate. Continue intravenous nausea medication and also check the stool for Clostridium difficile if she continues to have diarrhea and see how she does. Will request Dr. Gustavo Lah as consult tomorrow if patient does not improve in terms of her nausea, vomiting.  2.  History of depression. She is on Seroquel that she can continue.  3.  History of asthma, no wheezing. Continue Advair but use nebulizer as needed.  4.  History of  Ehlers-Danlos syndrome. Continue other home medications.   TIME SPENT ON HISTORY AND PHYSICAL: About 55 minutes.    ____________________________ Epifanio Lesches, MD sk:jm D: 11/21/2012 14:01:09 ET T: 11/21/2012 14:41:56 ET JOB#: [id number]  cc: Epifanio Lesches, MD, <Dictator> Kirstie Peri. Caryn Section, MD Epifanio Lesches MD ELECTRONICALLY SIGNED 12/12/2012 21:57

## 2014-12-05 NOTE — Discharge Summary (Signed)
  DATE OF BIRTH:  [birth year]  DATE OF ADMISSION:  11/21/2012 DATE OF DISCHARGE:  11/23/2012  CONSULTATIONS:  None.  DISCHARGE DIAGNOSES: 1.  Intractable nausea, vomiting, likely secondary to acute gastritis.  2.  Peptic ulcer disease.  3.  History of Ehlers-Danlos syndrome. 4.  History of neuropathy. 5.  Seasonal allergies.  6.  Depression.   DISCHARGE MEDICATIONS:   1. BuSpar 10 mg p.o. b.i.d.  2. Haldol 0.5 mg p.o. t.i.d.  3. Celebrex 200 mg p.o. b.i.d.  4. Baclofen 10 mg p.o. t.i.d.  5. Advair Diskus 250/50 one puff mg b.i.d.  6. Tramadol 50 mg p.o. t.i.d.  7. Albuterol nebulizer 2.5 mg/3 ml for shortness of breath.   8. Voltaren topical gel to affected area 4 times daily. 9. Sucralfate 1 gram p.o. 4 times a day. 10. Omeprazole 20 mg p.o. b.i.d.  11. Diphenhydramine 25 mg for  itching.  12. Oxycodone 10 mg every 6 hours. 13. Cranberry 1 tablet once a daily.  14. Flonase 60 mcg 2 sprays daily. 15. Pantoprazole 40 mg p.o. b.i.d.  16. Quetiapine 200 mg daily.  17.  Promethazine 25 mg p.o. t.i.d.  18.  Neurontin 600 mg p.o. b.i.d.  19.  Faxofenadine 180 mg p.o. daily.  20. Camila 0.35 mg p.o. daily.  21.  Ondansetron 4 mg every 8 hours.   CONSULTATIONS:  None.   PRIMARY CARE PHYSICIAN: Primary doctor is Dr. Lelon Huh.    HOSPITAL COURSE:  [name] with history of Ehlers-Danlos syndrome and history of peptic ulcer disease. Follows with Dr. Gustavo Lah. Came in because of nausea, vomiting, unable to keep anything down. The patient did have diarrhea at home. Admitted to hospitalist service for acute gastroenteritis. Patient started on IV fluids. Stools for C. diff were ordered, but she never had diarrhea after she was admitted, and she was started on clear liquids. She also was getting Zofran, IV PPI along with Sucralfate.    Lipase was elevated at 395. LFTs were within normal limits, and also CBC. Also electrolytes were stable on admission. CBC was showing WBC  4.4, hemoglobin 15.6, hematocrit 46.3, platelets 317 on admission. She also had a CT of the chest, abdomen and pelvis which showed no pulmonary emboli. She had ascending colon mildly distended with fluid, and the transverse and descending colon mildly distended with stool and fluid. They are nonspecific findings. The patient felt much better after receiving fluids, IV PPI,  along with IV Zofran. Advanced her diet to a low fiber diet. The patient felt better. Lipase came back at 81. The patient is discharged home with PPI along with Zofran. She has appointment with Dr. Gustavo Lah. She was told that she needs a low fiber diet, and she is on PPI along with Sucralfate. At the time of discharge, vitals stable. Discharged home in stable condition.   TIME SPENT: Time spent on discharge more than 30 minutes.  ____________________________ Epifanio Lesches, MD sk:mr D: 11/24/2012 13:46:00 ET T: 11/24/2012 18:50:03 ET JOB#: [id number]  cc: Kirstie Peri. Caryn Section, MD Epifanio Lesches, MD, <Dictator>     Epifanio Lesches MD ELECTRONICALLY SIGNED 12/04/2012 19:38

## 2014-12-16 DIAGNOSIS — M549 Dorsalgia, unspecified: Secondary | ICD-10-CM | POA: Diagnosis not present

## 2014-12-16 DIAGNOSIS — R209 Unspecified disturbances of skin sensation: Secondary | ICD-10-CM | POA: Diagnosis not present

## 2014-12-18 DIAGNOSIS — M47814 Spondylosis without myelopathy or radiculopathy, thoracic region: Secondary | ICD-10-CM | POA: Diagnosis not present

## 2015-01-20 ENCOUNTER — Other Ambulatory Visit: Payer: Self-pay | Admitting: Family Medicine

## 2015-01-20 MED ORDER — FENTANYL 75 MCG/HR TD PT72: 75.0000 ug | MEDICATED_PATCH | TRANSDERMAL | Status: DC

## 2015-01-20 NOTE — Telephone Encounter (Signed)
Refill request for Fentanyl 75 mcg/hr patch Last filled by MD on- 12/16/2014 #10 x3 Last Appt: 12/16/2014 Next Appt: none Please advise refill?

## 2015-01-20 NOTE — Telephone Encounter (Signed)
Pt contacted office for refill request on the following medications: Fentanyl 75 MCG/HR. Thanks TNP

## 2015-01-21 ENCOUNTER — Other Ambulatory Visit: Payer: Self-pay | Admitting: Family Medicine

## 2015-01-21 NOTE — Telephone Encounter (Signed)
Please call in the following     [facility]  3 hours ago   (5:03 PM)    [facility] [id number] [place]  3 hours ago   (5:03 PM)             Requested Medications     Medication name:  Name from pharmacy:  Eszopiclone 3 MG TABS ESZOPICLONE 3 MG TABLET    Sig: TAKE 1 TABLET EACH NIGHT AT BEDTIME AS NEEDED    #90, rf x 1

## 2015-01-22 NOTE — Telephone Encounter (Signed)
rx called into pharmacy

## 2015-01-25 ENCOUNTER — Other Ambulatory Visit: Payer: Self-pay | Admitting: Family Medicine

## 2015-02-17 ENCOUNTER — Telehealth: Payer: Self-pay | Admitting: Family Medicine

## 2015-02-17 NOTE — Telephone Encounter (Signed)
Pt call for refill on her pain medication.  Call when ready to pick up.

## 2015-02-18 ENCOUNTER — Other Ambulatory Visit: Payer: Self-pay | Admitting: Family Medicine

## 2015-02-18 MED ORDER — FENTANYL 75 MCG/HR TD PT72: 75.0000 ug | MEDICATED_PATCH | TRANSDERMAL | Status: DC

## 2015-02-18 NOTE — Telephone Encounter (Signed)
Pt needs refill on FENTANYL 75  Please call when ready.  Thanks, tp

## 2015-02-23 ENCOUNTER — Other Ambulatory Visit: Payer: Self-pay | Admitting: *Deleted

## 2015-02-23 MED ORDER — QUETIAPINE FUMARATE 200 MG PO TABS: 200.0000 mg | ORAL_TABLET | Freq: Every day | ORAL | Status: DC

## 2015-03-09 ENCOUNTER — Telehealth: Payer: Self-pay | Admitting: Family Medicine

## 2015-03-09 MED ORDER — OXYCODONE HCL ER 10 MG PO T12A: 10.0000 mg | EXTENDED_RELEASE_TABLET | Freq: Two times a day (BID) | ORAL | Status: DC

## 2015-03-09 MED ORDER — DEXILANT 30 MG PO CPDR: 30.0000 mg | DELAYED_RELEASE_CAPSULE | Freq: Every day | ORAL | Status: DC

## 2015-03-09 NOTE — Telephone Encounter (Signed)
Pt contacted office for refill request on the following medications:  Oxycontin 10mg  and Dexilant 30mg .  [facility].  [phone]

## 2015-03-10 ENCOUNTER — Ambulatory Visit: Payer: Self-pay | Admitting: Family Medicine

## 2015-03-10 ENCOUNTER — Ambulatory Visit (INDEPENDENT_AMBULATORY_CARE_PROVIDER_SITE_OTHER): Payer: Medicare Other | Admitting: Family Medicine

## 2015-03-10 ENCOUNTER — Encounter: Payer: Self-pay | Admitting: Family Medicine

## 2015-03-10 VITALS — BP 102/64 | HR 82 | Temp 97.7°F | Resp 16

## 2015-03-10 DIAGNOSIS — F329 Major depressive disorder, single episode, unspecified: Secondary | ICD-10-CM | POA: Insufficient documentation

## 2015-03-10 DIAGNOSIS — L659 Nonscarring hair loss, unspecified: Secondary | ICD-10-CM | POA: Insufficient documentation

## 2015-03-10 DIAGNOSIS — R209 Unspecified disturbances of skin sensation: Secondary | ICD-10-CM

## 2015-03-10 DIAGNOSIS — F418 Other specified anxiety disorders: Secondary | ICD-10-CM | POA: Insufficient documentation

## 2015-03-10 DIAGNOSIS — D649 Anemia, unspecified: Secondary | ICD-10-CM | POA: Insufficient documentation

## 2015-03-10 DIAGNOSIS — M5136 Other intervertebral disc degeneration, lumbar region: Secondary | ICD-10-CM | POA: Insufficient documentation

## 2015-03-10 DIAGNOSIS — J019 Acute sinusitis, unspecified: Secondary | ICD-10-CM | POA: Diagnosis not present

## 2015-03-10 DIAGNOSIS — IMO0001 Reserved for inherently not codable concepts without codable children: Secondary | ICD-10-CM | POA: Insufficient documentation

## 2015-03-10 DIAGNOSIS — I341 Nonrheumatic mitral (valve) prolapse: Secondary | ICD-10-CM | POA: Insufficient documentation

## 2015-03-10 DIAGNOSIS — Z8614 Personal history of Methicillin resistant Staphylococcus aureus infection: Secondary | ICD-10-CM | POA: Insufficient documentation

## 2015-03-10 DIAGNOSIS — R200 Anesthesia of skin: Secondary | ICD-10-CM | POA: Insufficient documentation

## 2015-03-10 DIAGNOSIS — R110 Nausea: Secondary | ICD-10-CM | POA: Insufficient documentation

## 2015-03-10 DIAGNOSIS — G4739 Other sleep apnea: Secondary | ICD-10-CM | POA: Insufficient documentation

## 2015-03-10 DIAGNOSIS — M25449 Effusion, unspecified hand: Secondary | ICD-10-CM | POA: Diagnosis not present

## 2015-03-10 DIAGNOSIS — J302 Other seasonal allergic rhinitis: Secondary | ICD-10-CM | POA: Insufficient documentation

## 2015-03-10 DIAGNOSIS — F32A Depression, unspecified: Secondary | ICD-10-CM | POA: Insufficient documentation

## 2015-03-10 DIAGNOSIS — G40909 Epilepsy, unspecified, not intractable, without status epilepticus: Secondary | ICD-10-CM | POA: Insufficient documentation

## 2015-03-10 DIAGNOSIS — J4530 Mild persistent asthma, uncomplicated: Secondary | ICD-10-CM | POA: Insufficient documentation

## 2015-03-10 DIAGNOSIS — R202 Paresthesia of skin: Secondary | ICD-10-CM | POA: Insufficient documentation

## 2015-03-10 DIAGNOSIS — G4700 Insomnia, unspecified: Secondary | ICD-10-CM | POA: Insufficient documentation

## 2015-03-10 DIAGNOSIS — M1990 Unspecified osteoarthritis, unspecified site: Secondary | ICD-10-CM | POA: Insufficient documentation

## 2015-03-10 DIAGNOSIS — R109 Unspecified abdominal pain: Secondary | ICD-10-CM | POA: Insufficient documentation

## 2015-03-10 MED ORDER — AZITHROMYCIN 250 MG PO TABS: ORAL_TABLET | ORAL | Status: AC

## 2015-03-10 NOTE — Progress Notes (Signed)
Patient: Karen [name] Female    DOB: March 26, 1976   39 y.o.   MRN: [id number] Visit Date: 03/10/2015  Today's Provider: Lelon Huh, MD   Chief Complaint  Patient presents with  . URI    x 2 weeks   Subjective:    URI  This is a new problem. Episode onset: 2 weeks ago. The problem has been gradually worsening. Maximum temperature: 102. Associated symptoms include congestion, coughing (dry), headaches, joint pain, joint swelling, a plugged ear sensation (has decrease hearing), rhinorrhea, sinus pain, sneezing, a sore throat and wheezing. Pertinent negatives include no abdominal pain, chest pain, diarrhea, dysuria, ear pain, nausea, neck pain, rash, swollen glands or vomiting. She has tried decongestant for the symptoms. The treatment provided mild relief.   Joint Swelling.  States that she has had worsening swelling and stiffness in her fingers the last few months. Complains that third digits catch when trying to extend PIPs.     Allergies  Allergen Reactions  . Demerol [Meperidine] Other (See Comments)    Slow to wake up when this drug is given.   Marland Kitchen Keflex [Cephalexin] Nausea And Vomiting  . Morphine And Related Nausea And Vomiting  . Toradol [Ketorolac Tromethamine] Nausea And Vomiting  . Augmentin  [Amoxicillin-Pot Clavulanate]   . Chocolate     GI distress  . Nsaids     patient develops ulcers  . Strawberry     GI distress   Previous Medications   ALBUTEROL (PROVENTIL HFA;VENTOLIN HFA) 108 (90 BASE) MCG/ACT INHALER    Inhale 2 puffs into the lungs every 6 (six) hours as needed for wheezing or shortness of breath.   ALBUTEROL (PROVENTIL) (2.5 MG/3ML) 0.083% NEBULIZER SOLUTION    Inhale into the lungs. 1 inhalation via nebulizer every 6 hours as needed   BACLOFEN (LIORESAL) 10 MG TABLET    Take 10 mg by mouth 3 (three) times daily as needed for muscle spasms.   BUSPIRONE (BUSPAR) 10 MG TABLET    Take 1 tablet by mouth 2 (two) times daily.   CELECOXIB (CELEBREX)  200 MG CAPSULE    Take 200 mg by mouth 2 (two) times daily.   DEXILANT 30 MG CAPSULE    Take 1 capsule (30 mg total) by mouth daily.   DICLOFENAC SODIUM (VOLTAREN) 1 % GEL    Place 1 application onto the skin 3 (three) times daily as needed.   DULOXETINE (CYMBALTA) 60 MG CAPSULE    Take 2 capsules (120 mg total) by mouth daily.   ESZOPICLONE (ESZOPICLONE) 3 MG TABS    Take 1 tablet by mouth at bedtime as needed.   ESZOPICLONE 3 MG TABS    TAKE 1 TABLET EACH NIGHT AT BEDTIME AS NEEDED   FENTANYL (DURAGESIC - DOSED MCG/HR) 75 MCG/HR    Place 1 patch (75 mcg total) onto the skin every 3 (three) days.   FEXOFENADINE (ALLEGRA) 180 MG TABLET    Take 180 mg by mouth daily.   FLUTICASONE (FLONASE) 50 MCG/ACT NASAL SPRAY    Place 2 sprays into both nostrils daily.   FLUTICASONE-SALMETEROL (ADVAIR) 250-50 MCG/DOSE AEPB    Inhale 1 puff into the lungs 2 (two) times daily.   GABAPENTIN (NEURONTIN) 600 MG TABLET    Take 600 mg by mouth 2 (two) times daily. 2 by mouth every morning and 3 at bedtime   HALOPERIDOL (HALDOL) 0.5 MG TABLET    Take one half tablet 3 times daily   LIDOCAINE (  LIDODERM) 5 %    Place 3 patches onto the skin daily. Remove & Discard patch within 12 hours or as directed by MD   LORATADINE (CLARITIN) 10 MG TABLET    Take 10 mg by mouth daily.   NORETHINDRONE (MICRONOR,CAMILA,ERRIN) 0.35 MG TABLET    Take 1 tablet by mouth 3 (three) times daily.    OMEPRAZOLE (PRILOSEC) 40 MG CAPSULE    Take 40 mg by mouth 2 (two) times daily.    ONDANSETRON (ZOFRAN) 4 MG TABLET    Take 4 mg by mouth every 8 (eight) hours as needed for nausea or vomiting.   OXYCODONE (OXYCONTIN) 10 MG T12A 12 HR TABLET    Take 1 tablet (10 mg total) by mouth every 12 (twelve) hours.   PANTOPRAZOLE (PROTONIX) 40 MG TABLET    Take 40 mg by mouth 2 (two) times daily.   PREGABALIN (LYRICA) 150 MG CAPSULE    Take 150 mg by mouth 3 (three) times daily.   QUETIAPINE (SEROQUEL) 200 MG TABLET    Take 1 tablet (200 mg total) by mouth  at bedtime.   TIZANIDINE (ZANAFLEX) 4 MG TABLET    TAKE 1/2 - 1 TABLET BY MOUTH EVERY EIGHT HOURS   TRAMADOL (ULTRAM) 50 MG TABLET    Take by mouth every 8 (eight) hours as needed. Take 1-2 tabs by mouth every 8 hrs as needed    Review of Systems  HENT: Positive for congestion, rhinorrhea, sneezing and sore throat. Negative for ear pain.   Respiratory: Positive for cough (dry) and wheezing.   Cardiovascular: Negative for chest pain.  Gastrointestinal: Negative for nausea, vomiting, abdominal pain and diarrhea.  Genitourinary: Negative for dysuria.  Musculoskeletal: Positive for joint pain. Negative for neck pain.  Skin: Negative for rash.  Neurological: Positive for headaches.    History  Substance Use Topics  . Smoking status: Never Smoker   . Smokeless tobacco: Never Used  . Alcohol Use: No   Past Medical History  Diagnosis Date  . Transient alteration of awareness   . Ehlers-Danlos syndrome   . Tics of organic origin   . Headache    Patient Active Problem List   Diagnosis Date Noted  . Abdominal pain 03/10/2015  . Alopecia 03/10/2015  . Anemia 03/10/2015  . Arthritis 03/10/2015  . Asthma 03/10/2015  . Chronic nausea 03/10/2015  . DDD (degenerative disc disease), lumbar 03/10/2015  . Depression 03/10/2015  . Personal history of MRSA (methicillin resistant Staphylococcus aureus) 03/10/2015  . Insomnia 03/10/2015  . Mitral valve prolapse 03/10/2015  . Paresthesias/numbness 03/10/2015  . Arthritis or polyarthritis, rheumatoid 03/10/2015  . Allergic rhinitis, seasonal 03/10/2015  . Seizure disorder 03/10/2015  . Apnea, sleep 03/10/2015  . Migraine without aura 01/27/2014  . Fibromyalgia 01/27/2014  . Transient alteration of awareness 01/07/2014  . Tics of organic origin 01/07/2014  . Backache, unspecified 01/07/2014  . Ehlers-Danlos syndrome 01/07/2014    Objective:   BP 102/64 mmHg  Pulse 82  Temp(Src) 97.7 F (36.5 C) (Oral)  Resp 16  SpO2 98%  Physical  Exam General Appearance:    Alert, cooperative, no distress  HENT:   bilateral TM normal without fluid or infection, neck without nodes, pharynx erythematous without exudate and frontal and maxillary sinus tender  Eyes:    PERRL, conjunctiva/corneas clear, EOM's intact       Lungs:     Clear to auscultation bilaterally, respirations unlabored  Heart:    Regular rate and rhythm  Neurologic:  Awake and alert in NAD           Assessment & Plan:     1. Acute sinusitis, recurrence not specified, unspecified location Zpack. Call if not greatly improved in 4-5 days.   2. Finger joint swelling, unspecified laterality  - Ambulatory referral to Rheumatology       Lelon Huh, MD  [facility]

## 2015-03-20 ENCOUNTER — Telehealth: Payer: Self-pay | Admitting: Family Medicine

## 2015-03-20 MED ORDER — FENTANYL 75 MCG/HR TD PT72: 75.0000 ug | MEDICATED_PATCH | TRANSDERMAL | Status: DC

## 2015-03-20 NOTE — Telephone Encounter (Signed)
Needs refill on fentanyl patch.  Mother to pick up.  Pt says she is totally out.  Thanks,  Con Memos

## 2015-03-20 NOTE — Telephone Encounter (Signed)
Please review. Thanks!  

## 2015-03-31 ENCOUNTER — Other Ambulatory Visit: Payer: Self-pay | Admitting: Family Medicine

## 2015-04-01 NOTE — Telephone Encounter (Signed)
Please call in the following medication. [id number] [phone]   Surescripts Out Interface  15 hours ago   (4:48 PM)         New medications from outside sources are available for reconciliation.       Requested Medications     Name from pharmacy:  In chart as:  LYRICA 300 MG CAPSULE LYRICA 300 MG capsule    Sig: TAKE ONE CAPSULE BY MOUTH TWICE A DAY    Dispense: 180 capsule RF: 1

## 2015-04-02 ENCOUNTER — Other Ambulatory Visit: Payer: Self-pay | Admitting: Family Medicine

## 2015-04-21 ENCOUNTER — Other Ambulatory Visit: Payer: Self-pay | Admitting: Family Medicine

## 2015-04-21 MED ORDER — FENTANYL 75 MCG/HR TD PT72: 75.0000 ug | MEDICATED_PATCH | TRANSDERMAL | Status: DC

## 2015-04-21 NOTE — Telephone Encounter (Signed)
Pt needs to pick up Rx for her fentanyl patches  Call back is [phone]  Thanks Con Memos

## 2015-04-21 NOTE — Telephone Encounter (Signed)
Patient requesting refill for fentanyl patches.

## 2015-04-29 DIAGNOSIS — M65331 Trigger finger, right middle finger: Secondary | ICD-10-CM | POA: Diagnosis not present

## 2015-04-29 DIAGNOSIS — Q796 Ehlers-Danlos syndrome: Secondary | ICD-10-CM | POA: Diagnosis not present

## 2015-04-29 DIAGNOSIS — M79641 Pain in right hand: Secondary | ICD-10-CM | POA: Diagnosis not present

## 2015-05-11 ENCOUNTER — Encounter: Payer: Self-pay | Admitting: Family Medicine

## 2015-05-22 ENCOUNTER — Other Ambulatory Visit: Payer: Self-pay | Admitting: Family Medicine

## 2015-05-22 MED ORDER — FENTANYL 75 MCG/HR TD PT72: 75.0000 ug | MEDICATED_PATCH | TRANSDERMAL | Status: DC

## 2015-05-22 NOTE — Telephone Encounter (Signed)
Pt contacted office for refill request on the following medications: fentaNYL (DURAGESIC - DOSED MCG/HR) 75 MCG/HR.  MQ#[phone]

## 2015-06-18 ENCOUNTER — Other Ambulatory Visit: Payer: Self-pay | Admitting: Family Medicine

## 2015-06-19 ENCOUNTER — Other Ambulatory Visit: Payer: Self-pay | Admitting: Family Medicine

## 2015-06-19 MED ORDER — FENTANYL 75 MCG/HR TD PT72: 75.0000 ug | MEDICATED_PATCH | TRANSDERMAL | Status: DC

## 2015-06-19 NOTE — Telephone Encounter (Signed)
Pt contacted office for refill request on the following medications:  fentaNYL (DURAGESIC - DOSED MCG/HR) 75 MCG/HR.  ME#[phone]

## 2015-06-19 NOTE — Telephone Encounter (Signed)
Pt called back to see if RX for fentaNYL (DURAGESIC - DOSED MCG/HR) 75 MCG/HR was ready for pick up. Thanks TNP

## 2015-06-20 ENCOUNTER — Other Ambulatory Visit: Payer: Self-pay | Admitting: Pediatrics

## 2015-06-23 ENCOUNTER — Ambulatory Visit: Payer: Self-pay | Admitting: Pediatrics

## 2015-06-27 ENCOUNTER — Other Ambulatory Visit: Payer: Self-pay | Admitting: Family Medicine

## 2015-07-13 ENCOUNTER — Telehealth: Payer: Self-pay | Admitting: Family Medicine

## 2015-07-13 NOTE — Telephone Encounter (Signed)
Pt wants to know all of her DOS since 05/2013  Her call back is  [phone]  Thanks Con Memos

## 2015-07-14 NOTE — Telephone Encounter (Signed)
LMOVM for pt to return call 

## 2015-07-15 ENCOUNTER — Ambulatory Visit: Payer: Medicare Other | Admitting: Pediatrics

## 2015-07-20 ENCOUNTER — Other Ambulatory Visit: Payer: Self-pay | Admitting: Family Medicine

## 2015-07-20 MED ORDER — FENTANYL 75 MCG/HR TD PT72: 75.0000 ug | MEDICATED_PATCH | TRANSDERMAL | Status: DC

## 2015-07-20 NOTE — Telephone Encounter (Signed)
Pt called for refill fentaNYL (DURAGESIC - DOSED MCG/HR) 75 MCG/HR 06/19/15 -- Birdie Sons, MD Place 1 patch (75 mcg total) onto the skin every     Please advise patient when ready  [phone]  Thanks Con Memos

## 2015-07-21 MED ORDER — FENTANYL 75 MCG/HR TD PT72: 75.0000 ug | MEDICATED_PATCH | TRANSDERMAL | Status: DC

## 2015-07-21 NOTE — Addendum Note (Signed)
Addended by: Julieta Bellini on: 07/21/2015 02:48 PM   Modules accepted: Orders

## 2015-07-29 ENCOUNTER — Ambulatory Visit (INDEPENDENT_AMBULATORY_CARE_PROVIDER_SITE_OTHER): Payer: Medicare Other | Admitting: Pediatrics

## 2015-07-29 ENCOUNTER — Encounter: Payer: Self-pay | Admitting: Pediatrics

## 2015-07-29 VITALS — BP 104/70 | HR 84 | Ht 59.5 in | Wt 143.2 lb

## 2015-07-29 DIAGNOSIS — Q796 Ehlers-Danlos syndrome, unspecified: Secondary | ICD-10-CM

## 2015-07-29 DIAGNOSIS — M797 Fibromyalgia: Secondary | ICD-10-CM | POA: Diagnosis not present

## 2015-07-29 DIAGNOSIS — G2569 Other tics of organic origin: Secondary | ICD-10-CM

## 2015-07-29 MED ORDER — GABAPENTIN 600 MG PO TABS: 600.0000 mg | ORAL_TABLET | Freq: Two times a day (BID) | ORAL | Status: DC

## 2015-07-29 MED ORDER — HALOPERIDOL 0.5 MG PO TABS: ORAL_TABLET | ORAL | Status: DC
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
## 2015-08-06 ENCOUNTER — Other Ambulatory Visit: Payer: Self-pay | Admitting: Family Medicine

## 2015-08-18 ENCOUNTER — Other Ambulatory Visit: Payer: Self-pay | Admitting: Family Medicine

## 2015-08-20 ENCOUNTER — Other Ambulatory Visit: Payer: Self-pay | Admitting: Family Medicine

## 2015-08-20 MED ORDER — FENTANYL 75 MCG/HR TD PT72: 75.0000 ug | MEDICATED_PATCH | TRANSDERMAL | Status: DC

## 2015-08-20 NOTE — Telephone Encounter (Signed)
Pt contacted office for refill request on the following medications: fentaNYL (DURAGESIC - DOSED MCG/HR) 75 MCG/HR.  CB#[phone]/MW ° °

## 2015-08-21 ENCOUNTER — Other Ambulatory Visit: Payer: Self-pay | Admitting: Family Medicine

## 2015-08-21 NOTE — Telephone Encounter (Signed)
Please call in [place]. Thanks.

## 2015-08-21 NOTE — Telephone Encounter (Signed)
Done. Prescription phoned in.

## 2015-08-31 ENCOUNTER — Encounter: Payer: Self-pay | Admitting: Family Medicine

## 2015-08-31 DIAGNOSIS — G8929 Other chronic pain: Secondary | ICD-10-CM | POA: Insufficient documentation

## 2015-08-31 DIAGNOSIS — H9190 Unspecified hearing loss, unspecified ear: Secondary | ICD-10-CM | POA: Insufficient documentation

## 2015-08-31 DIAGNOSIS — M069 Rheumatoid arthritis, unspecified: Secondary | ICD-10-CM | POA: Insufficient documentation

## 2015-08-31 DIAGNOSIS — M549 Dorsalgia, unspecified: Secondary | ICD-10-CM

## 2015-09-14 ENCOUNTER — Other Ambulatory Visit: Payer: Self-pay | Admitting: Family Medicine

## 2015-09-18 ENCOUNTER — Other Ambulatory Visit: Payer: Self-pay | Admitting: Family Medicine

## 2015-09-18 MED ORDER — FENTANYL 75 MCG/HR TD PT72: 75.0000 ug | MEDICATED_PATCH | TRANSDERMAL | Status: DC

## 2015-09-18 NOTE — Telephone Encounter (Signed)
Pt needs refill fentaNYL (DURAGESIC - DOSED MCG/HR) 75 MCG/HR  She is completely out,  Her call back is [phone]  Thanks Con Memos

## 2015-10-10 ENCOUNTER — Other Ambulatory Visit: Payer: Self-pay | Admitting: Family Medicine

## 2015-10-11 NOTE — Telephone Encounter (Signed)
Please call in tramadol.  

## 2015-10-12 NOTE — Telephone Encounter (Signed)
Rx called in to pharmacy. 

## 2015-10-19 ENCOUNTER — Other Ambulatory Visit: Payer: Self-pay | Admitting: Family Medicine

## 2015-10-19 MED ORDER — FENTANYL 75 MCG/HR TD PT72: 75.0000 ug | MEDICATED_PATCH | TRANSDERMAL | Status: DC

## 2015-10-19 NOTE — Telephone Encounter (Signed)
Pt needs refill fentaNYL (DURAGESIC - DOSED MCG/HR) 75 MCG/HR   ThanksTeri

## 2015-10-30 ENCOUNTER — Other Ambulatory Visit: Payer: Self-pay | Admitting: Family Medicine

## 2015-10-30 NOTE — Telephone Encounter (Signed)
Please call in Lyrica. Thanks.

## 2015-10-30 NOTE — Telephone Encounter (Signed)
Lyrica called into pharmacy. sd

## 2015-11-01 ENCOUNTER — Other Ambulatory Visit: Payer: Self-pay | Admitting: Family Medicine

## 2015-11-02 ENCOUNTER — Telehealth: Payer: Self-pay

## 2015-11-02 NOTE — Telephone Encounter (Signed)
Dr. Caryn Section has been prescribing Lyrica since 2016.  I provided Neurontin for much longer.  I suggested to the pharmacy that they talk with him.  I understand that these may conflict with each other based on their pharmacokinetics and mechanism of action.

## 2015-11-02 NOTE — Telephone Encounter (Signed)
[facility] in [place] called wanting to know about patient's medication. They wanted to verify whether or not she is supposed to be taking the Lyrica and Neurontin. They are requesting a call  Back.  [phone]

## 2015-11-13 ENCOUNTER — Other Ambulatory Visit: Payer: Self-pay | Admitting: Family Medicine

## 2015-11-13 NOTE — Telephone Encounter (Signed)
Pt contacted office for refill request on the following medications: fentaNYL (DURAGESIC - DOSED MCG/HR) 75 MCG/HR.  CB#[phone]/MW ° °

## 2015-11-14 ENCOUNTER — Other Ambulatory Visit: Payer: Self-pay | Admitting: Family Medicine

## 2015-11-14 MED ORDER — FENTANYL 75 MCG/HR TD PT72: 75.0000 ug | MEDICATED_PATCH | TRANSDERMAL | Status: DC

## 2015-11-14 NOTE — Telephone Encounter (Signed)
Pt needs a refill on  fentaNYL (DURAGESIC - DOSED MCG/HR) 75 MCG/H

## 2015-11-14 NOTE — Telephone Encounter (Signed)
Rx filled by Venia Minks.

## 2015-12-15 ENCOUNTER — Other Ambulatory Visit: Payer: Self-pay | Admitting: Family Medicine

## 2015-12-15 MED ORDER — FENTANYL 75 MCG/HR TD PT72: 75.0000 ug | MEDICATED_PATCH | TRANSDERMAL | Status: DC

## 2015-12-15 NOTE — Telephone Encounter (Signed)
Pt contacted office for refill request on the following medications: fentaNYL (DURAGESIC - DOSED MCG/HR) 75 MCG/HR. Please advise. Thanks TNP

## 2015-12-22 ENCOUNTER — Other Ambulatory Visit: Payer: Self-pay | Admitting: Family Medicine

## 2016-01-12 ENCOUNTER — Other Ambulatory Visit: Payer: Self-pay | Admitting: Family Medicine

## 2016-01-12 MED ORDER — FENTANYL 75 MCG/HR TD PT72: 75.0000 ug | MEDICATED_PATCH | TRANSDERMAL | Status: DC

## 2016-01-12 NOTE — Telephone Encounter (Signed)
Pt contacted office for refill request on the following medications:  fentaNYL (DURAGESIC - DOSED MCG/HR) 75 MCG/HR.  [id number]

## 2016-01-21 DIAGNOSIS — M47816 Spondylosis without myelopathy or radiculopathy, lumbar region: Secondary | ICD-10-CM | POA: Diagnosis not present

## 2016-01-21 DIAGNOSIS — M47814 Spondylosis without myelopathy or radiculopathy, thoracic region: Secondary | ICD-10-CM | POA: Diagnosis not present

## 2016-01-21 DIAGNOSIS — M791 Myalgia: Secondary | ICD-10-CM | POA: Diagnosis not present

## 2016-01-31 ENCOUNTER — Other Ambulatory Visit: Payer: Self-pay | Admitting: Family Medicine

## 2016-02-03 ENCOUNTER — Other Ambulatory Visit: Payer: Self-pay | Admitting: Family Medicine

## 2016-02-03 MED ORDER — FENTANYL 75 MCG/HR TD PT72: 75.0000 ug | MEDICATED_PATCH | TRANSDERMAL | Status: DC

## 2016-02-10 ENCOUNTER — Other Ambulatory Visit: Payer: Self-pay | Admitting: Family Medicine

## 2016-02-10 NOTE — Telephone Encounter (Signed)
Pt needing a refill fentaNYL (DURAGESIC - DOSED MCG/HR) 75 MCG/HR,  Pt is out as of today. Thanks CC

## 2016-02-10 NOTE — Telephone Encounter (Signed)
Rx has ready to pick-up.

## 2016-02-18 DIAGNOSIS — M47814 Spondylosis without myelopathy or radiculopathy, thoracic region: Secondary | ICD-10-CM | POA: Diagnosis not present

## 2016-02-24 ENCOUNTER — Other Ambulatory Visit: Payer: Self-pay

## 2016-02-24 MED ORDER — NORETHINDRONE 0.35 MG PO TABS: 1.0000 | ORAL_TABLET | Freq: Every day | ORAL | Status: DC

## 2016-02-24 NOTE — Telephone Encounter (Signed)
Pharmacy requesting refill.

## 2016-03-07 ENCOUNTER — Other Ambulatory Visit: Payer: Self-pay | Admitting: Family Medicine

## 2016-03-07 ENCOUNTER — Telehealth: Payer: Self-pay | Admitting: Family Medicine

## 2016-03-07 MED ORDER — FENTANYL 75 MCG/HR TD PT72: 75.0000 ug | MEDICATED_PATCH | TRANSDERMAL | 0 refills | Status: DC
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
## 2016-03-08 MED ORDER — ESZOPICLONE 3 MG PO TABS: ORAL_TABLET | ORAL | 1 refills | Status: DC

## 2016-03-08 NOTE — Telephone Encounter (Signed)
Please call in Lunesta

## 2016-03-08 NOTE — Telephone Encounter (Signed)
Please call in [place]. Thanks.

## 2016-03-08 NOTE — Telephone Encounter (Signed)
Letter has been printed and signed.

## 2016-03-08 NOTE — Telephone Encounter (Signed)
This is done; but I only called it in for one refill (Instead of five)  It looks like the rx is for a 90 day supply.   Thanks,   -Mickel Baas

## 2016-03-09 NOTE — Telephone Encounter (Signed)
Prescription for Lunesta phoned into pharmacy. Prescription for Fentanyl placed up front for pick up. I called patients mom and advised her of this.

## 2016-03-09 NOTE — Telephone Encounter (Signed)
Pt requesting fentaNYL (DURAGESIC - DOSED MCG/HR) 75 MCG/HR

## 2016-03-14 ENCOUNTER — Other Ambulatory Visit: Payer: Self-pay | Admitting: Family Medicine

## 2016-04-02 ENCOUNTER — Other Ambulatory Visit: Payer: Self-pay | Admitting: Family Medicine

## 2016-04-04 ENCOUNTER — Other Ambulatory Visit: Payer: Self-pay | Admitting: Pediatrics

## 2016-04-04 DIAGNOSIS — Q796 Ehlers-Danlos syndrome, unspecified: Secondary | ICD-10-CM

## 2016-04-04 DIAGNOSIS — M797 Fibromyalgia: Secondary | ICD-10-CM

## 2016-04-06 ENCOUNTER — Other Ambulatory Visit: Payer: Self-pay | Admitting: Family Medicine

## 2016-04-06 MED ORDER — FENTANYL 75 MCG/HR TD PT72: 75.0000 ug | MEDICATED_PATCH | TRANSDERMAL | 0 refills | Status: DC

## 2016-04-06 NOTE — Telephone Encounter (Signed)
Pt contacted office for refill request on the following medications: fentaNYL (DURAGESIC - DOSED MCG/HR) 75 MCG/HR Last written: 03/07/16 Last OV: 03/10/15 Please advise. Thanks TNP

## 2016-05-03 ENCOUNTER — Other Ambulatory Visit: Payer: Self-pay | Admitting: Family Medicine

## 2016-05-03 MED ORDER — FENTANYL 75 MCG/HR TD PT72: 75.0000 ug | MEDICATED_PATCH | TRANSDERMAL | 0 refills | Status: DC

## 2016-05-05 ENCOUNTER — Other Ambulatory Visit: Payer: Self-pay | Admitting: Family Medicine

## 2016-05-05 NOTE — Telephone Encounter (Signed)
prescription placed up front for pick up. Patient advised.

## 2016-05-05 NOTE — Telephone Encounter (Signed)
Pt contacted office for refill request on the following medications: fentaNYL (DURAGESIC - DOSED MCG/HR) 75 MCG/HR Last Written: 04/06/16 Last OV: 03/10/15 Pt was advised that Dr. Caryn Section is out of the office until 05/16/16. Pt stated that she hasn't picked up an RX since she picked the one up in August. It appears that an RX for the medication was printed on 05/03/16 but wasn't signed in up front and pt stated she didn't pick it up. Please advise. Thanks TNP

## 2016-05-08 ENCOUNTER — Other Ambulatory Visit: Payer: Self-pay | Admitting: Family Medicine

## 2016-05-15 ENCOUNTER — Other Ambulatory Visit: Payer: Self-pay | Admitting: Family

## 2016-05-15 DIAGNOSIS — Q796 Ehlers-Danlos syndrome, unspecified: Secondary | ICD-10-CM

## 2016-05-15 DIAGNOSIS — M797 Fibromyalgia: Secondary | ICD-10-CM

## 2016-05-23 ENCOUNTER — Telehealth (INDEPENDENT_AMBULATORY_CARE_PROVIDER_SITE_OTHER): Payer: Self-pay

## 2016-05-23 NOTE — Telephone Encounter (Signed)
LVM to CB and schedule FU appointment

## 2016-05-23 NOTE — Telephone Encounter (Signed)
-----   Message from Rockwell Germany, NP sent at 05/16/2016  9:33 AM EDT ----- Regarding: Needs appointment [name] needs an appointment with Dr Gaynell Face Thanks,  [name]

## 2016-05-30 ENCOUNTER — Other Ambulatory Visit: Payer: Self-pay | Admitting: Family Medicine

## 2016-05-30 MED ORDER — FENTANYL 75 MCG/HR TD PT72: 75.0000 ug | MEDICATED_PATCH | TRANSDERMAL | 0 refills | Status: DC

## 2016-05-30 NOTE — Telephone Encounter (Signed)
Pt contacted office for refill request on the following medications: fentaNYL (DURAGESIC - DOSED MCG/HR) 75 MCG/HR Last Written: 05/03/16 Last OV: 03/10/15 Please advise. Thanks TNP

## 2016-06-02 ENCOUNTER — Other Ambulatory Visit: Payer: Self-pay | Admitting: Family Medicine

## 2016-06-02 ENCOUNTER — Other Ambulatory Visit: Payer: Self-pay | Admitting: Pediatrics

## 2016-06-02 DIAGNOSIS — G2569 Other tics of organic origin: Secondary | ICD-10-CM

## 2016-06-03 ENCOUNTER — Other Ambulatory Visit: Payer: Self-pay | Admitting: Pediatrics

## 2016-06-03 DIAGNOSIS — G2569 Other tics of organic origin: Secondary | ICD-10-CM

## 2016-06-03 NOTE — Telephone Encounter (Signed)
Please call in lyrica

## 2016-06-03 NOTE — Telephone Encounter (Signed)
Pt contacted office for refill request on the following medications: LYRICA 300 MG capsule  To [facility] [address] [name] stated she is going out of town and would like this sent today if possible. Last Written: 10/30/15 90 day supply with 3 refills Last OV: 03/09/16 Pt stated that on her bottle it shows no refills. Please advise. Thanks TNP

## 2016-06-03 NOTE — Telephone Encounter (Signed)
Done. Prescription phoned into the pharmacy.  

## 2016-06-06 ENCOUNTER — Encounter (INDEPENDENT_AMBULATORY_CARE_PROVIDER_SITE_OTHER): Payer: Self-pay | Admitting: Family

## 2016-06-06 NOTE — Telephone Encounter (Signed)
LVM to CB to schedule 6 mo fu appt °

## 2016-06-06 NOTE — Telephone Encounter (Signed)
-----   Message from Rockwell Germany, NP sent at 06/03/2016  5:57 AM EDT ----- Regarding: Needs appointment [name] needs an appointment with Dr Gaynell Face

## 2016-06-07 ENCOUNTER — Encounter (INDEPENDENT_AMBULATORY_CARE_PROVIDER_SITE_OTHER): Payer: Self-pay | Admitting: Family

## 2016-06-26 ENCOUNTER — Other Ambulatory Visit: Payer: Self-pay | Admitting: Family Medicine

## 2016-06-29 ENCOUNTER — Other Ambulatory Visit: Payer: Self-pay | Admitting: Family Medicine

## 2016-06-29 MED ORDER — FENTANYL 75 MCG/HR TD PT72: 75.0000 ug | MEDICATED_PATCH | TRANSDERMAL | 0 refills | Status: DC

## 2016-06-29 NOTE — Telephone Encounter (Signed)
Pt needs refill on the Fentanyl patches.  Please call when ready to pick up.  [phone]  Thanks Con Memos

## 2016-06-30 ENCOUNTER — Ambulatory Visit (INDEPENDENT_AMBULATORY_CARE_PROVIDER_SITE_OTHER): Payer: Medicare Other | Admitting: Pediatrics

## 2016-06-30 ENCOUNTER — Encounter (INDEPENDENT_AMBULATORY_CARE_PROVIDER_SITE_OTHER): Payer: Self-pay | Admitting: Pediatrics

## 2016-06-30 DIAGNOSIS — Q796 Ehlers-Danlos syndrome, unspecified: Secondary | ICD-10-CM

## 2016-06-30 DIAGNOSIS — G2569 Other tics of organic origin: Secondary | ICD-10-CM

## 2016-06-30 DIAGNOSIS — M797 Fibromyalgia: Secondary | ICD-10-CM | POA: Diagnosis not present

## 2016-06-30 MED ORDER — HALOPERIDOL 0.5 MG PO TABS: ORAL_TABLET | ORAL | 5 refills | Status: DC

## 2016-06-30 MED ORDER — LIDOCAINE 5 % EX PTCH: MEDICATED_PATCH | CUTANEOUS | 5 refills | Status: DC

## 2016-06-30 MED ORDER — GABAPENTIN 600 MG PO TABS: ORAL_TABLET | ORAL | 5 refills | Status: DC
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
## 2016-07-01 ENCOUNTER — Telehealth (INDEPENDENT_AMBULATORY_CARE_PROVIDER_SITE_OTHER): Payer: Self-pay | Admitting: Family

## 2016-07-01 NOTE — Telephone Encounter (Signed)
No exceptions. I tried to see if it could be appealed, but they will not approve it. [name]

## 2016-07-01 NOTE — Telephone Encounter (Signed)
I received a prior authorization request for Lidocaine patches. I attempted to obtain the prior authorization but her insurance denied it. They will only cover that medication for post herpetic neuralgia after shingles, diabetic neuropathy and neuropathy related to cancer treatment. TG

## 2016-07-01 NOTE — Telephone Encounter (Signed)
I called mom to inform her that we cannot get authorization for the Lidoderm.

## 2016-07-01 NOTE — Telephone Encounter (Signed)
No exceptions?

## 2016-07-26 ENCOUNTER — Other Ambulatory Visit: Payer: Self-pay | Admitting: Family Medicine

## 2016-07-26 NOTE — Telephone Encounter (Signed)
Pt contacted office for refill request on the following medications:  fentaNYL (DURAGESIC - DOSED MCG/HR) 75 MCG/HR.  [id number]

## 2016-07-27 MED ORDER — FENTANYL 75 MCG/HR TD PT72: 75.0000 ug | MEDICATED_PATCH | TRANSDERMAL | 0 refills | Status: DC

## 2016-08-03 ENCOUNTER — Telehealth (INDEPENDENT_AMBULATORY_CARE_PROVIDER_SITE_OTHER): Payer: Self-pay | Admitting: Pediatrics

## 2016-08-03 NOTE — Telephone Encounter (Signed)
Patient seen on 06/30/16 at 9:15am

## 2016-08-03 NOTE — Telephone Encounter (Signed)
-----   Message from Rockwell Germany, NP sent at 06/03/2016  5:57 AM EDT ----- Regarding: Needs appointment Karen [name] needs an appointment with Dr Gaynell Face

## 2016-08-24 ENCOUNTER — Other Ambulatory Visit: Payer: Self-pay | Admitting: Family Medicine

## 2016-08-25 ENCOUNTER — Other Ambulatory Visit: Payer: Self-pay | Admitting: Family Medicine

## 2016-08-25 NOTE — Telephone Encounter (Signed)
Pt needs refill on her   fentaNYL (DURAGESIC - DOSED MCG/HR) 75 MCG/HRfentaNYL (DURAGESIC - DOSED MCG/HR) 75 MCG/HR  ThanksTeri

## 2016-08-26 MED ORDER — FENTANYL 75 MCG/HR TD PT72: 75.0000 ug | MEDICATED_PATCH | TRANSDERMAL | 0 refills | Status: DC

## 2016-09-12 ENCOUNTER — Encounter: Payer: Self-pay | Admitting: Family Medicine

## 2016-09-12 DIAGNOSIS — R569 Unspecified convulsions: Secondary | ICD-10-CM | POA: Insufficient documentation

## 2016-09-12 DIAGNOSIS — J329 Chronic sinusitis, unspecified: Secondary | ICD-10-CM | POA: Insufficient documentation

## 2016-09-15 DIAGNOSIS — M47817 Spondylosis without myelopathy or radiculopathy, lumbosacral region: Secondary | ICD-10-CM | POA: Diagnosis not present

## 2016-09-15 DIAGNOSIS — Z0271 Encounter for disability determination: Secondary | ICD-10-CM

## 2016-09-15 DIAGNOSIS — M47816 Spondylosis without myelopathy or radiculopathy, lumbar region: Secondary | ICD-10-CM | POA: Diagnosis not present

## 2016-09-23 ENCOUNTER — Other Ambulatory Visit: Payer: Self-pay | Admitting: Family Medicine

## 2016-09-23 MED ORDER — FENTANYL 75 MCG/HR TD PT72: 75.0000 ug | MEDICATED_PATCH | TRANSDERMAL | 0 refills | Status: DC

## 2016-09-23 NOTE — Telephone Encounter (Signed)
LOV (acute) 03/09/2016. LOV 08/26/2016. Renaldo Fiddler, CMA

## 2016-09-23 NOTE — Telephone Encounter (Signed)
Pt contacted office for refill request on the following medications:  fentaNYL (DURAGESIC - DOSED MCG/HR) 75 MCG/HR.  [id number]

## 2016-09-25 ENCOUNTER — Other Ambulatory Visit: Payer: Self-pay | Admitting: Family Medicine

## 2016-09-26 NOTE — Telephone Encounter (Signed)
Rx called in to pharmacy. 

## 2016-09-29 ENCOUNTER — Other Ambulatory Visit: Payer: Self-pay | Admitting: Family Medicine

## 2016-10-17 ENCOUNTER — Other Ambulatory Visit: Payer: Self-pay | Admitting: Family Medicine

## 2016-10-18 ENCOUNTER — Ambulatory Visit: Payer: Self-pay | Admitting: Family Medicine

## 2016-10-20 ENCOUNTER — Other Ambulatory Visit: Payer: Self-pay | Admitting: Family Medicine

## 2016-10-20 MED ORDER — FENTANYL 75 MCG/HR TD PT72: 75.0000 ug | MEDICATED_PATCH | TRANSDERMAL | 0 refills | Status: DC

## 2016-10-20 NOTE — Telephone Encounter (Signed)
Pt contacted office for refill request on the following medications:  fentaNYL (DURAGESIC - DOSED MCG/HR) 75 MCG/HR.  GF#[phone]/[id number]

## 2016-10-21 ENCOUNTER — Encounter: Payer: Self-pay | Admitting: Family Medicine

## 2016-10-21 ENCOUNTER — Ambulatory Visit (INDEPENDENT_AMBULATORY_CARE_PROVIDER_SITE_OTHER): Payer: Medicare Other | Admitting: Family Medicine

## 2016-10-21 VITALS — BP 106/74 | HR 88 | Temp 98.1°F | Resp 16

## 2016-10-21 DIAGNOSIS — M549 Dorsalgia, unspecified: Secondary | ICD-10-CM | POA: Diagnosis not present

## 2016-10-21 DIAGNOSIS — M5136 Other intervertebral disc degeneration, lumbar region: Secondary | ICD-10-CM | POA: Diagnosis not present

## 2016-10-21 DIAGNOSIS — J45909 Unspecified asthma, uncomplicated: Secondary | ICD-10-CM | POA: Diagnosis not present

## 2016-10-21 DIAGNOSIS — G8929 Other chronic pain: Secondary | ICD-10-CM | POA: Diagnosis not present

## 2016-10-21 DIAGNOSIS — G40909 Epilepsy, unspecified, not intractable, without status epilepticus: Secondary | ICD-10-CM

## 2016-10-21 MED ORDER — FENTANYL 87.5 MCG/HR TD PT72
87.5000 ug | MEDICATED_PATCH | TRANSDERMAL | 0 refills | Status: DC
Start: 2016-10-21 — End: 2016-10-24

## 2016-10-21 NOTE — Patient Instructions (Signed)
   Try using OTC Lidocaine Patch intermittently for back pain

## 2016-10-21 NOTE — Progress Notes (Signed)
Patient: Karen [name] Female    DOB: 04-Aug-1976   41 y.o.   MRN: [id number] Visit Date: 10/21/2016  Today's Provider: Lelon Huh, MD   Chief Complaint  Patient presents with  . Fibromyalgia  . Back Pain   Subjective:    Back Pain  This is a chronic problem. The current episode started more than 1 year ago. The problem occurs constantly. The problem has been rapidly worsening (In the last two weeks.) since onset. The pain is present in the lumbar spine and thoracic spine. The pain is at a severity of 10/10. The pain is severe. The pain is the same all the time. The symptoms are aggravated by standing and sitting (Transferring ). Stiffness is present all day. Associated symptoms include headaches. Pertinent negatives include no abdominal pain, chest pain or fever. She has tried bed rest (Pt is taking sleeping pills and flexeril to "sleep through the pain". ) for the symptoms.  Muscle Pain  This is a chronic problem. Pain location: Pt reports muscle and joint pain throughtout her body. Associated symptoms include fatigue, headaches, nausea (When the pain flares. ) and wheezing. Pertinent negatives include no abdominal pain, chest pain, constipation, diarrhea, fever, joint swelling, rash, shortness of breath or vomiting.   She apparently had nerve ablation by orthopedics in [place] about 3 weeks ago and had to lay still on stomach for extended period of time. She feels like that resulted in some strain on her back which has not improved since. She has also been out of Lidocaine patches due to insurance no longer covering it. She had been using 3 patches for 12 hours a day which seemed to be beneficial.   Allergies  Allergen Reactions  . Demerol [Meperidine] Other (See Comments)    Slow to wake up when this drug is given.   Marland Kitchen Keflex [Cephalexin] Nausea And Vomiting  . Morphine And Related Nausea And Vomiting  . Toradol [Ketorolac Tromethamine] Nausea And Vomiting  . Augmentin   [Amoxicillin-Pot Clavulanate]   . Chocolate     GI distress  . Nsaids     patient develops ulcers  . Strawberry Extract     GI distress  . Tape Dermatitis     Current Outpatient Prescriptions:  .  ADVAIR DISKUS 250-50 MCG/DOSE AEPB, INHALE 1 PUFF TWICE A DAY, Disp: 60 each, Rfl: 6 .  busPIRone (BUSPAR) 10 MG tablet, TAKE 1 TABLET BY MOUTH TWICE A DAY, Disp: 60 tablet, Rfl: 1 .  celecoxib (CELEBREX) 200 MG capsule, TAKE 1 CAPSULE BY MOUTH 2 TIMES A DAY, Disp: 60 capsule, Rfl: 5 .  DEXILANT 30 MG capsule, TAKE ONE CAPSULE BY MOUTH DAILY, Disp: 30 capsule, Rfl: 12 .  DULoxetine (CYMBALTA) 60 MG capsule, TAKE 2 CAPSULES (120 MG TOTAL) BY MOUTH DAILY., Disp: 60 capsule, Rfl: 11 .  Eszopiclone 3 MG TABS, TAKE 1 TABLET BY MOUTH EACH NIGHT AT BEDTIME AS NEEDED, Disp: 90 tablet, Rfl: 1 .  fentaNYL (DURAGESIC - DOSED MCG/HR) 75 MCG/HR, Place 1 patch (75 mcg total) onto the skin every 3 (three) days., Disp: 10 patch, Rfl: 0 .  fexofenadine (ALLEGRA) 180 MG tablet, Take 180 mg by mouth daily., Disp: , Rfl:  .  fluticasone (FLONASE) 50 MCG/ACT nasal spray, Place 2 sprays into both nostrils daily., Disp: , Rfl:  .  gabapentin (NEURONTIN) 600 MG tablet, 2 by mouth every morning and 3 at bedtime, Disp: 155 tablet, Rfl: 5 .  haloperidol (HALDOL) 0.5  MG tablet, Take one half tablet twice daily, Disp: 31 tablet, Rfl: 5 .  loratadine (CLARITIN) 10 MG tablet, Take 10 mg by mouth daily., Disp: , Rfl:  .  LYRICA 300 MG capsule, TAKE ONE CAPSULE BY MOUTH TWICE A DAY, Disp: 180 capsule, Rfl: 1 .  norethindrone (MICRONOR,CAMILA,ERRIN) 0.35 MG tablet, Take 1 tablet (0.35 mg total) by mouth daily., Disp: 28 tablet, Rfl: 11 .  omeprazole (PRILOSEC) 40 MG capsule, Take 40 mg by mouth 2 (two) times daily. , Disp: , Rfl:  .  ondansetron (ZOFRAN-ODT) 4 MG disintegrating tablet, DISSOLVE 1 TABLET IN MOUTH EVERY 8 HOURS FOR NAUSEA AND VOMITING, Disp: 30 tablet, Rfl: 5 .  QUEtiapine (SEROQUEL) 200 MG tablet, TAKE 1 TABLET  (200 MG TOTAL) BY MOUTH AT BEDTIME., Disp: 30 tablet, Rfl: 11 .  tiZANidine (ZANAFLEX) 4 MG tablet, TAKE 1/2 TO 1 TABLET BY MOUTH EVERY EIGHT HOURS, Disp: 90 tablet, Rfl: 4 .  VENTOLIN HFA 108 (90 Base) MCG/ACT inhaler, INHALE 2 PUFFS EVERY 6 HOURS AS NEEDED, Disp: 18 Inhaler, Rfl: 5 .  VOLTAREN 1 % GEL, APPLY AS NEEDED 3 TIMES A DAY, Disp: 100 g, Rfl: 1  Review of Systems  Constitutional: Positive for fatigue. Negative for activity change, appetite change, chills, diaphoresis, fever and unexpected weight change.  HENT: Positive for sinus pain and sinus pressure.   Respiratory: Positive for cough and wheezing. Negative for apnea, choking, chest tightness, shortness of breath and stridor.   Cardiovascular: Negative.  Negative for chest pain.  Gastrointestinal: Positive for nausea (When the pain flares. ). Negative for abdominal distention, abdominal pain, anal bleeding, blood in stool, constipation, diarrhea, rectal pain and vomiting.  Endocrine: Positive for cold intolerance. Negative for heat intolerance, polydipsia, polyphagia and polyuria.  Musculoskeletal: Positive for arthralgias, back pain, gait problem, myalgias, neck pain and neck stiffness. Negative for joint swelling.  Skin: Negative for rash.  Neurological: Positive for headaches. Negative for dizziness and light-headedness.    Social History  Substance Use Topics  . Smoking status: Never Smoker  . Smokeless tobacco: Never Used  . Alcohol use No   Objective:   BP 106/74 (BP Location: Left Arm, Patient Position: Sitting, Cuff Size: Normal)   Pulse 88   Temp 98.1 F (36.7 C) (Oral)   Resp 16    Physical Exam   General Appearance:    Alert, cooperative, no distress, confined to wheelchair.   Eyes:    PERRL, conjunctiva/corneas clear, EOM's intact       Lungs:     Clear to auscultation bilaterally, respirations unlabored  Heart:    Regular rate and rhythm  Neurologic:   Awake, alert, oriented x 3. Tender along thoracic  and lumbar spine. No gross deformities.           Assessment & Plan:     1. Chronic bilateral back pain, unspecified back location Mild flare up, probably exacerbated by stopping Lidocaine patches, she was encouraged to try OTC 4% lidocain patch. Will increase Fentanyl patch to 87.5 mcg for the next month.   2. Seizure disorder ([place]) Well controlled, continue regular follow up Dr. Gaynell Face.   3. DDD (degenerative disc disease), lumbar   4. Uncomplicated asthma, unspecified asthma severity, unspecified whether persistent Well controlled.        Lelon Huh, MD  [facility]

## 2016-10-22 ENCOUNTER — Telehealth: Payer: Self-pay | Admitting: Family Medicine

## 2016-10-22 NOTE — Telephone Encounter (Signed)
I received an after-hours call from team health nurse [facility] wanted to talk to me about fentanyl patch; I called [phone]  Pharmacist said they don't have 87.5 mcg/hour patch; hey would need Dr. Caryn Section to write two separate prescriptions, one for a 75 mcg patch and one for a 12.5 mcg patch to equal 87.5 mcg  I reviewed the [place] web site She filled 75 mcg patches on 08/26/16 which should have lasted until 09/25/16 She filled the next 75 mcg patches on 09/24/16 (one day early) If she is using patches every 72 hours, the total filled since 08/26/16 should last her until 10/25/16 (Tuesday); this is Saturday 10/22/16  The office note from yesterday says she has chronic back pain and fibromyalgia going on for more than a year  I explained to the pharmacist that we don't write Rxs for narcotics after hours/weekends She appears to have enough of the 75 mcg patches to last until 10/25/16 of her previous dose until she can catch up with Dr. Caryn Section on Monday and address getting new fentanyl prescriptions or addressing breakthrough pain medicine  The other recourse over the weekend is for her to go to urgent care or the ER for evaluation and treatment; pharmacist was comfortable with this plan

## 2016-10-24 ENCOUNTER — Other Ambulatory Visit: Payer: Self-pay | Admitting: Family Medicine

## 2016-10-24 MED ORDER — FENTANYL 100 MCG/HR TD PT72: 100.0000 ug | MEDICATED_PATCH | TRANSDERMAL | 0 refills | Status: DC

## 2016-11-02 ENCOUNTER — Telehealth: Payer: Self-pay | Admitting: Family Medicine

## 2016-11-02 NOTE — Telephone Encounter (Signed)
Mother came by to let you know that her Karen [name] needs to be prior authorized.  The authorization ran out on 10/31/16.

## 2016-11-03 NOTE — Telephone Encounter (Signed)
Please advise 

## 2016-11-03 NOTE — Telephone Encounter (Signed)
Please call pharmacy and see if they need prior auth done.

## 2016-11-03 NOTE — Telephone Encounter (Signed)
Per the pharmacist (at [facility] [address]), she reports that she does not know if it needs a prior auth because the patient just had it filled on 09/25/2016 with a 90 day supply. She states that they will not be able to tell until it is filled as a new Rx and run it through insurance.

## 2016-11-23 ENCOUNTER — Other Ambulatory Visit: Payer: Self-pay | Admitting: Family Medicine

## 2016-11-24 ENCOUNTER — Other Ambulatory Visit: Payer: Self-pay | Admitting: Family Medicine

## 2016-11-24 MED ORDER — FENTANYL 100 MCG/HR TD PT72: 100.0000 ug | MEDICATED_PATCH | TRANSDERMAL | 0 refills | Status: DC

## 2016-11-24 NOTE — Telephone Encounter (Signed)
Pt contacted office for refill request on the following medications:  fentaNYL (DURAGESIC - DOSED MCG/HR) 100 MCG/HR.  VX#[phone]/[id number]

## 2016-12-05 DIAGNOSIS — M47816 Spondylosis without myelopathy or radiculopathy, lumbar region: Secondary | ICD-10-CM | POA: Diagnosis not present

## 2016-12-05 DIAGNOSIS — M791 Myalgia: Secondary | ICD-10-CM | POA: Diagnosis not present

## 2016-12-21 ENCOUNTER — Telehealth: Payer: Self-pay | Admitting: Family Medicine

## 2016-12-21 NOTE — Telephone Encounter (Signed)
Please advise 

## 2016-12-21 NOTE — Telephone Encounter (Signed)
Pt calling saying the pharmacy wants Dr. Caryn Section to write 2 separate prescriptions for the fentanyl  One patch is for 75 mcg per hr and the 2nd for 82mch per hr.    [facility] [address]  Pt call back is [phone]  Thanks Con Memos

## 2016-12-22 MED ORDER — FENTANYL 12 MCG/HR TD PT72: 12.5000 ug | MEDICATED_PATCH | TRANSDERMAL | 0 refills | Status: DC

## 2016-12-22 MED ORDER — FENTANYL 75 MCG/HR TD PT72: 75.0000 ug | MEDICATED_PATCH | TRANSDERMAL | 0 refills | Status: DC

## 2017-01-03 DIAGNOSIS — M47814 Spondylosis without myelopathy or radiculopathy, thoracic region: Secondary | ICD-10-CM | POA: Diagnosis not present

## 2017-01-05 DIAGNOSIS — M65332 Trigger finger, left middle finger: Secondary | ICD-10-CM | POA: Diagnosis not present

## 2017-01-05 DIAGNOSIS — M65331 Trigger finger, right middle finger: Secondary | ICD-10-CM | POA: Diagnosis not present

## 2017-01-20 ENCOUNTER — Other Ambulatory Visit: Payer: Self-pay | Admitting: Family Medicine

## 2017-01-20 MED ORDER — FENTANYL 75 MCG/HR TD PT72: 75.0000 ug | MEDICATED_PATCH | TRANSDERMAL | 0 refills | Status: DC

## 2017-01-20 MED ORDER — FENTANYL 12 MCG/HR TD PT72: 12.5000 ug | MEDICATED_PATCH | TRANSDERMAL | 0 refills | Status: DC

## 2017-01-20 NOTE — Telephone Encounter (Signed)
Pt contacted office for refill request on the following medications:  CB#[phone]/[id number]  fentaNYL (DURAGESIC - DOSED MCG/HR) 12 MCG/HR  fentaNYL (DURAGESIC - DOSED MCG/HR) 75 MCG/HR

## 2017-01-25 ENCOUNTER — Other Ambulatory Visit: Payer: Self-pay | Admitting: Family Medicine

## 2017-01-25 NOTE — Telephone Encounter (Signed)
Please call in Lyrica

## 2017-01-26 NOTE — Telephone Encounter (Signed)
Rx called in to pharmacy. 

## 2017-01-31 ENCOUNTER — Other Ambulatory Visit: Payer: Self-pay | Admitting: Family Medicine

## 2017-02-16 ENCOUNTER — Other Ambulatory Visit: Payer: Self-pay | Admitting: Family Medicine

## 2017-02-16 ENCOUNTER — Other Ambulatory Visit (INDEPENDENT_AMBULATORY_CARE_PROVIDER_SITE_OTHER): Payer: Self-pay | Admitting: Pediatrics

## 2017-02-16 DIAGNOSIS — M797 Fibromyalgia: Secondary | ICD-10-CM

## 2017-02-16 DIAGNOSIS — G2569 Other tics of organic origin: Secondary | ICD-10-CM

## 2017-02-16 DIAGNOSIS — Q796 Ehlers-Danlos syndrome, unspecified: Secondary | ICD-10-CM

## 2017-02-21 ENCOUNTER — Other Ambulatory Visit: Payer: Self-pay | Admitting: Family Medicine

## 2017-02-21 MED ORDER — FENTANYL 12 MCG/HR TD PT72: 12.5000 ug | MEDICATED_PATCH | TRANSDERMAL | 0 refills | Status: DC

## 2017-02-21 NOTE — Telephone Encounter (Signed)
Pt needs refill on his fentaNYL (DURAGESIC - DOSED MCG/HR) 12 MCG/HR  Thanks, teri

## 2017-02-21 NOTE — Telephone Encounter (Signed)
Please review. Thanks!  

## 2017-02-22 ENCOUNTER — Other Ambulatory Visit: Payer: Self-pay | Admitting: Family Medicine

## 2017-02-22 MED ORDER — FENTANYL 75 MCG/HR TD PT72: 75.0000 ug | MEDICATED_PATCH | TRANSDERMAL | 0 refills | Status: DC

## 2017-02-22 NOTE — Telephone Encounter (Signed)
Patients mom is waiting at the front desk.

## 2017-02-22 NOTE — Telephone Encounter (Signed)
Patient needs a refill for fentaNYL (DURAGESIC - DOSED MCG/HR) 75 MCG/HR

## 2017-03-04 ENCOUNTER — Other Ambulatory Visit: Payer: Self-pay | Admitting: Family Medicine

## 2017-03-22 ENCOUNTER — Other Ambulatory Visit: Payer: Self-pay | Admitting: Family Medicine

## 2017-03-22 NOTE — Telephone Encounter (Signed)
Please review. Thanks!  

## 2017-03-22 NOTE — Telephone Encounter (Signed)
Pt contacted office for refill request on the following medications: ° °fentaNYL (DURAGESIC - DOSED MCG/HR) 12 ° MCG/HR  ° °fentaNYL (DURAGESIC - DOSED MCG/HR) 75 MCG/HR  ° °CB#[phone]/[id number] ° ° ° °

## 2017-03-23 MED ORDER — FENTANYL 75 MCG/HR TD PT72: 75.0000 ug | MEDICATED_PATCH | TRANSDERMAL | 0 refills | Status: DC

## 2017-03-23 MED ORDER — FENTANYL 12 MCG/HR TD PT72: 12.5000 ug | MEDICATED_PATCH | TRANSDERMAL | 0 refills | Status: DC

## 2017-03-24 ENCOUNTER — Other Ambulatory Visit (INDEPENDENT_AMBULATORY_CARE_PROVIDER_SITE_OTHER): Payer: Self-pay | Admitting: Pediatrics

## 2017-03-24 ENCOUNTER — Other Ambulatory Visit: Payer: Self-pay | Admitting: Family Medicine

## 2017-03-24 DIAGNOSIS — G2569 Other tics of organic origin: Secondary | ICD-10-CM

## 2017-03-24 DIAGNOSIS — Q796 Ehlers-Danlos syndrome, unspecified: Secondary | ICD-10-CM

## 2017-03-24 DIAGNOSIS — M797 Fibromyalgia: Secondary | ICD-10-CM

## 2017-04-03 ENCOUNTER — Other Ambulatory Visit: Payer: Self-pay | Admitting: Family Medicine

## 2017-04-11 DIAGNOSIS — M47814 Spondylosis without myelopathy or radiculopathy, thoracic region: Secondary | ICD-10-CM | POA: Diagnosis not present

## 2017-04-20 ENCOUNTER — Other Ambulatory Visit: Payer: Self-pay | Admitting: Family Medicine

## 2017-04-20 MED ORDER — FENTANYL 75 MCG/HR TD PT72: 75.0000 ug | MEDICATED_PATCH | TRANSDERMAL | 0 refills | Status: DC

## 2017-04-20 MED ORDER — FENTANYL 12 MCG/HR TD PT72: 12.5000 ug | MEDICATED_PATCH | TRANSDERMAL | 0 refills | Status: DC

## 2017-04-20 NOTE — Telephone Encounter (Signed)
Pt contacted office for refill request on the following medications:  1. fentaNYL (DURAGESIC - DOSED MCG/HR) 12 MCG/HR  2. fentaNYL (DURAGESIC - DOSED MCG/HR) 75 MCG/HR  Last Rx: 03/23/17 LOV: 10/21/16  Please advise. Thanks TNP

## 2017-04-21 ENCOUNTER — Other Ambulatory Visit: Payer: Self-pay | Admitting: Emergency Medicine

## 2017-04-21 ENCOUNTER — Other Ambulatory Visit (INDEPENDENT_AMBULATORY_CARE_PROVIDER_SITE_OTHER): Payer: Self-pay | Admitting: Pediatrics

## 2017-04-21 DIAGNOSIS — M797 Fibromyalgia: Secondary | ICD-10-CM

## 2017-04-21 DIAGNOSIS — Q796 Ehlers-Danlos syndrome, unspecified: Secondary | ICD-10-CM

## 2017-04-21 MED ORDER — ALBUTEROL SULFATE HFA 108 (90 BASE) MCG/ACT IN AERS: 2.0000 | INHALATION_SPRAY | Freq: Four times a day (QID) | RESPIRATORY_TRACT | 5 refills | Status: DC | PRN

## 2017-04-21 MED ORDER — DICLOFENAC SODIUM 1 % TD GEL: TRANSDERMAL | 1 refills | Status: DC

## 2017-04-21 NOTE — Telephone Encounter (Signed)
Please review. Thanks!  

## 2017-04-21 NOTE — Telephone Encounter (Signed)
Also, Pt also needs a new nebulizer machine and all the parts. The one that she has is broken.

## 2017-04-21 NOTE — Telephone Encounter (Signed)
Pt requesting refill

## 2017-04-27 ENCOUNTER — Other Ambulatory Visit (INDEPENDENT_AMBULATORY_CARE_PROVIDER_SITE_OTHER): Payer: Self-pay | Admitting: Pediatrics

## 2017-04-27 ENCOUNTER — Other Ambulatory Visit: Payer: Self-pay | Admitting: Family Medicine

## 2017-04-27 DIAGNOSIS — G2569 Other tics of organic origin: Secondary | ICD-10-CM

## 2017-05-01 ENCOUNTER — Ambulatory Visit (INDEPENDENT_AMBULATORY_CARE_PROVIDER_SITE_OTHER): Payer: Medicare Other | Admitting: Pediatrics

## 2017-05-24 ENCOUNTER — Ambulatory Visit (INDEPENDENT_AMBULATORY_CARE_PROVIDER_SITE_OTHER): Payer: Medicare Other | Admitting: Pediatrics

## 2017-05-24 ENCOUNTER — Other Ambulatory Visit: Payer: Self-pay | Admitting: Family Medicine

## 2017-05-24 DIAGNOSIS — Z23 Encounter for immunization: Secondary | ICD-10-CM | POA: Diagnosis not present

## 2017-05-24 MED ORDER — FENTANYL 75 MCG/HR TD PT72: 75.0000 ug | MEDICATED_PATCH | TRANSDERMAL | 0 refills | Status: DC

## 2017-05-24 MED ORDER — FENTANYL 12 MCG/HR TD PT72: 12.5000 ug | MEDICATED_PATCH | TRANSDERMAL | 0 refills | Status: DC

## 2017-05-24 NOTE — Telephone Encounter (Addendum)
Pt contacted office for refill request on the following medications:  fentaNYL (DURAGESIC - DOSED MCG/HR) 12 MCG/HR   fentaNYL (DURAGESIC - DOSED MCG/HR) 75 MCG/HR  SU#[phone]/[id number]

## 2017-05-26 ENCOUNTER — Other Ambulatory Visit: Payer: Self-pay | Admitting: Family Medicine

## 2017-05-26 MED ORDER — FENTANYL 75 MCG/HR TD PT72: 75.0000 ug | MEDICATED_PATCH | TRANSDERMAL | 0 refills | Status: DC

## 2017-05-26 MED ORDER — FENTANYL 12 MCG/HR TD PT72: 12.5000 ug | MEDICATED_PATCH | TRANSDERMAL | 0 refills | Status: DC

## 2017-06-10 ENCOUNTER — Other Ambulatory Visit (INDEPENDENT_AMBULATORY_CARE_PROVIDER_SITE_OTHER): Payer: Self-pay | Admitting: Pediatrics

## 2017-06-10 DIAGNOSIS — G2569 Other tics of organic origin: Secondary | ICD-10-CM

## 2017-06-14 ENCOUNTER — Telehealth: Payer: Self-pay | Admitting: Family Medicine

## 2017-06-14 MED ORDER — BUSPIRONE HCL 5 MG PO TABS: 10.0000 mg | ORAL_TABLET | Freq: Two times a day (BID) | ORAL | 5 refills | Status: DC

## 2017-06-14 NOTE — Telephone Encounter (Signed)
Pt's mother states she has been prescribed Buspar 10 MG and they are not available in that dosage; Pt's mother is requesting she be given two 5MG  pills twice daily instead.  States the pharmacy has that in stock.    Pt uses [place]

## 2017-06-14 NOTE — Telephone Encounter (Signed)
Please review. Thanks!  

## 2017-06-18 ENCOUNTER — Other Ambulatory Visit (INDEPENDENT_AMBULATORY_CARE_PROVIDER_SITE_OTHER): Payer: Self-pay | Admitting: Pediatrics

## 2017-06-18 DIAGNOSIS — Q796 Ehlers-Danlos syndrome, unspecified: Secondary | ICD-10-CM

## 2017-06-18 DIAGNOSIS — M797 Fibromyalgia: Secondary | ICD-10-CM

## 2017-06-27 ENCOUNTER — Other Ambulatory Visit: Payer: Self-pay | Admitting: Family Medicine

## 2017-06-27 MED ORDER — FENTANYL 75 MCG/HR TD PT72: 75.0000 ug | MEDICATED_PATCH | TRANSDERMAL | 0 refills | Status: DC

## 2017-06-27 MED ORDER — FENTANYL 12 MCG/HR TD PT72: 12.5000 ug | MEDICATED_PATCH | TRANSDERMAL | 0 refills | Status: DC

## 2017-06-27 NOTE — Telephone Encounter (Signed)
Pt contacted office for refill request on the following medications:  fentaNYL (DURAGESIC - DOSED MCG/HR) 12  MCG/HR   fentaNYL (DURAGESIC - DOSED MCG/HR) 75 MCG/HR   SK#[phone]/DI

## 2017-06-28 ENCOUNTER — Other Ambulatory Visit: Payer: Self-pay

## 2017-06-28 ENCOUNTER — Encounter (INDEPENDENT_AMBULATORY_CARE_PROVIDER_SITE_OTHER): Payer: Self-pay | Admitting: Pediatrics

## 2017-06-28 ENCOUNTER — Ambulatory Visit (INDEPENDENT_AMBULATORY_CARE_PROVIDER_SITE_OTHER): Payer: Medicare Other | Admitting: Pediatrics

## 2017-06-28 VITALS — BP 130/84 | HR 96 | Ht 59.5 in | Wt 140.0 lb

## 2017-06-28 DIAGNOSIS — Q796 Ehlers-Danlos syndrome, unspecified: Secondary | ICD-10-CM

## 2017-06-28 DIAGNOSIS — G2569 Other tics of organic origin: Secondary | ICD-10-CM | POA: Diagnosis not present

## 2017-06-28 DIAGNOSIS — M797 Fibromyalgia: Secondary | ICD-10-CM | POA: Diagnosis not present

## 2017-06-28 DIAGNOSIS — G4700 Insomnia, unspecified: Secondary | ICD-10-CM | POA: Diagnosis not present

## 2017-06-28 DIAGNOSIS — G4710 Hypersomnia, unspecified: Secondary | ICD-10-CM | POA: Insufficient documentation

## 2017-06-28 MED ORDER — GABAPENTIN 600 MG PO TABS: ORAL_TABLET | ORAL | 5 refills | Status: DC

## 2017-06-28 MED ORDER — HALOPERIDOL 0.5 MG PO TABS: 0.2500 mg | ORAL_TABLET | Freq: Two times a day (BID) | ORAL | 5 refills | Status: DC
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
## 2017-06-28 NOTE — Patient Instructions (Signed)
I have ordered a polysomnogram and multiple sleep latency test.  I refilled your prescription for haloperidol and gabapentin.  I will see you in follow-up after the sleep study is completed

## 2017-07-27 ENCOUNTER — Other Ambulatory Visit: Payer: Self-pay | Admitting: Family Medicine

## 2017-07-27 MED ORDER — FENTANYL 12 MCG/HR TD PT72: 12.5000 ug | MEDICATED_PATCH | TRANSDERMAL | 0 refills | Status: DC

## 2017-07-27 MED ORDER — FENTANYL 75 MCG/HR TD PT72
75.0000 ug | MEDICATED_PATCH | TRANSDERMAL | 0 refills | Status: DC
Start: 2017-07-27 — End: 2017-08-24

## 2017-07-27 NOTE — Telephone Encounter (Signed)
Pt contacted office for refill request on the following medications:  fentaNYL (DURAGESIC - DOSED MCG/HR) 12 MCG/HR  fentaNYL (DURAGESIC - DOSED MCG/HR) 75 MCG/HR  [facility]  [phone]

## 2017-08-20 ENCOUNTER — Ambulatory Visit (HOSPITAL_BASED_OUTPATIENT_CLINIC_OR_DEPARTMENT_OTHER): Payer: Medicare Other | Attending: Pediatrics | Admitting: Internal Medicine

## 2017-08-20 VITALS — Ht 59.5 in | Wt 140.0 lb

## 2017-08-20 DIAGNOSIS — G4710 Hypersomnia, unspecified: Secondary | ICD-10-CM

## 2017-08-20 DIAGNOSIS — G4733 Obstructive sleep apnea (adult) (pediatric): Secondary | ICD-10-CM | POA: Insufficient documentation

## 2017-08-20 DIAGNOSIS — G4731 Primary central sleep apnea: Secondary | ICD-10-CM

## 2017-08-20 DIAGNOSIS — R400 Somnolence: Secondary | ICD-10-CM | POA: Insufficient documentation

## 2017-08-21 ENCOUNTER — Encounter (HOSPITAL_BASED_OUTPATIENT_CLINIC_OR_DEPARTMENT_OTHER): Payer: Self-pay

## 2017-08-24 ENCOUNTER — Other Ambulatory Visit: Payer: Self-pay | Admitting: Family Medicine

## 2017-08-24 MED ORDER — FENTANYL 12 MCG/HR TD PT72: 12.5000 ug | MEDICATED_PATCH | TRANSDERMAL | 0 refills | Status: DC

## 2017-08-24 MED ORDER — FENTANYL 75 MCG/HR TD PT72: 75.0000 ug | MEDICATED_PATCH | TRANSDERMAL | 0 refills | Status: DC

## 2017-08-24 NOTE — Telephone Encounter (Signed)
Pt contacted office for refill request on the following medications:  1. fentaNYL (DURAGESIC - DOSED MCG/HR) 12 MCG/HR  2. fentaNYL (DURAGESIC - DOSED MCG/HR) 75 MCG/HR   [facility]  Last Rx: 07/27/17 LOV: 10/21/16  Please advise. Thanks TNP

## 2017-08-31 ENCOUNTER — Other Ambulatory Visit: Payer: Self-pay | Admitting: Family Medicine

## 2017-08-31 NOTE — Telephone Encounter (Signed)
Pt contacted office for refill request on the following medications:  diclofenac sodium (VOLTAREN) 1 % GEL  [facility]  Last Rx: 04/21/17 LOV: 10/21/16  Pt is requesting Rx to be sent in by tomorrow 09/01/17 because she is leaving to go out of town. Pt was advised that refill request can take 24 to 48 hours and that Dr. Caryn Section was out of the office this afternoon. Please advise. Thanks TNP

## 2017-09-01 MED ORDER — DICLOFENAC SODIUM 1 % TD GEL: TRANSDERMAL | 1 refills | Status: DC

## 2017-09-03 DIAGNOSIS — G4710 Hypersomnia, unspecified: Secondary | ICD-10-CM

## 2017-09-03 NOTE — Procedures (Signed)
Patient Name: Karen [name], Karen [name] Date: 08/20/2017 Gender: Female D.O.B: [birth year] Age (years): 18 Referring Provider: Princess Bruins Hickling Height (inches): 60 Interpreting Physician: Baird Lyons MD, ABSM Weight (lbs): 140 RPSGT: Zadie Rhine BMI: 28 MRN: [id number] Neck Size: 14.00 <br> <br> CLINICAL INFORMATION Sleep Study Type: NPSG Indication for sleep study: Daytime Fatigue  Epworth Sleepiness Score: 20  SLEEP STUDY TECHNIQUE As per the AASM Manual for the Scoring of Sleep and Associated Events v2.3 (April 2016) with a hypopnea requiring 4% desaturations.  The channels recorded and monitored were frontal, central and occipital EEG, electrooculogram (EOG), submentalis EMG (chin), nasal and oral airflow, thoracic and abdominal wall motion, anterior tibialis EMG, snore microphone, electrocardiogram, and pulse oximetry.  MEDICATIONS Medications self-administered by patient taken the night of the study : none reported  SLEEP ARCHITECTURE The study was initiated at 10:50:31 PM and ended at 5:52:27 AM.  Sleep onset time was 11.6 minutes and the sleep efficiency was 83.9%. The total sleep time was 354.0 minutes.  Stage REM latency was 33.5 minutes.  The patient spent 2.40% of the night in stage N1 sleep, 12.29% in stage N2 sleep, 21.75% in stage N3 and 63.56% in REM.  Alpha intrusion was absent.  Supine sleep was 0.00%.  RESPIRATORY PARAMETERS The overall apnea/hypopnea index (AHI) was 134.1 per hour. There were 743 total apneas, including 12 obstructive, 707 central and 24 mixed apneas. There were 48 hypopneas and 7 RERAs.  The AHI during Stage REM sleep was 155.5 per hour.  AHI while supine was N/A per hour.  The mean oxygen saturation was 91.66%. The minimum SpO2 during sleep was 80.00%.  Rare snoring was noted during this study.  CARDIAC DATA The 2 lead EKG demonstrated sinus rhythm. The mean heart rate was 81.27 beats per minute. Other EKG findings  include: None.  LEG MOVEMENT DATA The total PLMS were 0 with a resulting PLMS index of 0.00. Associated arousal with leg movement index was 0.0 .  IMPRESSIONS - Severe Complex (central, obstructive and mixed) sleep apnea occurred during this study (AHI = 134.1/h). - Most events were Central sleep apneas  during this study (CAI = 119.8/h). - Moderate oxygen desaturation was noted during this study (Min O2 = 80.00%, Mean 91.6% ). 38.9 minutes recorded with O2 saturation - Minimal snoring was audible during this study. - No cardiac abnormalities were noted during this study. - Clinically significant periodic limb movements did not occur during sleep. No significant associated arousals. - Increased REM pressure noted, nonspecific for rebound from REM suppression by sleep deprivation, withdrawal from REM suppressing med such as antidepressant, or Narcolepsy. If clinical concerns persist after treating the apneas, then reassessment including MSLT might be considered.   DIAGNOSIS - Complex Sleep Apnea, Central predominant - Nocturnal Hypoxemia (327.26 [G47.36 ICD-10])  RECOMMENDATIONS - Consider BIPAP ST or NIV( non-invasive ventilation) to support respiratory drive. Patient can return for BIPAP ST or ASV ( adaptive support ventilation) titration if appropriate. - An ABG may show evidence of chronic compensated CO2 retention due to hypoventilation. - Be careful with alcohol, sedatives and other CNS depressants that may worsen sleep apnea and disrupt normal sleep architecture. - Sleep hygiene should be reviewed to assess factors that may improve sleep quality. - Weight management and regular exercise should be initiated or continued if appropriate.  [Electronically signed] 09/03/2017 10:21 AM  Baird Lyons MD, [place], American Board of Sleep Medicine   NPI: [id number]  [place], Tax adviser of Sleep  Medicine  ELECTRONICALLY SIGNED ON:  09/03/2017, 9:57 AM [place] PH: [phone]   FX: [phone] [place]

## 2017-09-07 ENCOUNTER — Other Ambulatory Visit: Payer: Self-pay | Admitting: Family Medicine

## 2017-09-07 MED ORDER — FENTANYL 12 MCG/HR TD PT72: 12.5000 ug | MEDICATED_PATCH | TRANSDERMAL | 0 refills | Status: DC

## 2017-09-07 MED ORDER — FENTANYL 75 MCG/HR TD PT72: 75.0000 ug | MEDICATED_PATCH | TRANSDERMAL | 0 refills | Status: DC

## 2017-09-08 ENCOUNTER — Telehealth: Payer: Self-pay

## 2017-09-08 NOTE — Telephone Encounter (Signed)
both

## 2017-09-08 NOTE — Telephone Encounter (Signed)
[facility] needing clarification on patients medication. He wants to know if patient is supposed to be on Lyrica twice a day or Gabapentin twice a day. Pharmacy call back [phone].

## 2017-09-11 NOTE — Telephone Encounter (Signed)
Clarification was given to pharmacy.

## 2017-09-13 ENCOUNTER — Telehealth: Payer: Self-pay | Admitting: Internal Medicine

## 2017-09-13 DIAGNOSIS — G4710 Hypersomnia, unspecified: Secondary | ICD-10-CM

## 2017-09-13 NOTE — Telephone Encounter (Signed)
[facility] is not here this afternoon  Spoke with Dr Gaynell Face - he is aware and okay with call back tomorrow Please note again that the contact number is Dr. Melanee Left personal cell phone  Per Dr Gaynell Face, this is not urgent Routing to [facility]

## 2017-09-14 NOTE — Telephone Encounter (Signed)
I spoke with Dr. Annamaria Boots who is willing to see this patient in consultation, order was placed on the chart.  I left a message with the family that I had made this consultation and invited them to call for questions.

## 2017-09-15 ENCOUNTER — Telehealth (INDEPENDENT_AMBULATORY_CARE_PROVIDER_SITE_OTHER): Payer: Self-pay | Admitting: Pediatrics

## 2017-09-15 NOTE — Telephone Encounter (Signed)
Thank you for the information Samira! Mrs. Karen [name] is not due until next month.

## 2017-09-15 NOTE — Telephone Encounter (Signed)
°  Who's calling (name and relationship to patient) : Novali (Self) Best contact number: [phone] Provider they see: Dr. Gaynell Face  Reason for call: Pt called to schedule a follow up appointment with Dr. Gaynell Face where she wanted to discuss sleep apnea and CPAP machine. I saw that Dr. Gaynell Face sent a referral to [facility] where the aforementioned would be assessed and addressed. I followed up with [facility] [facility] to ensure that they received the referral and that they would be calling her to schedule an appointment. They confirmed. I wanted to double check to make sure that she did not need an appointment with Dr. Gaynell Face. If she does, I can schedule it with her. Just let me know.

## 2017-09-19 ENCOUNTER — Other Ambulatory Visit: Payer: Self-pay | Admitting: Family Medicine

## 2017-09-20 ENCOUNTER — Other Ambulatory Visit: Payer: Self-pay | Admitting: Family Medicine

## 2017-09-21 ENCOUNTER — Other Ambulatory Visit: Payer: Self-pay | Admitting: Family Medicine

## 2017-09-21 ENCOUNTER — Telehealth: Payer: Self-pay | Admitting: Family Medicine

## 2017-09-21 NOTE — Telephone Encounter (Signed)
Pt contacted office for refill request on the following medications:  1. fentaNYL (DURAGESIC - DOSED MCG/HR) 75 MCG/HR 2. fentaNYL (DURAGESIC - DOSED MCG/HR) 12 MCG/HR  [facility]  Last Rx: 09/07/17 LOV: 10/21/16 Pt stated that she will be out of the medication tomorrow. Please advise. Thanks TNP

## 2017-09-21 NOTE — Telephone Encounter (Addendum)
Per pt's insurance, PA is not required for this medication. Will fax a copy of statement to pharmacy.

## 2017-09-21 NOTE — Telephone Encounter (Signed)
[facility] to advised that pt's insurance is requiring a prior authorization on fentaNYL (DURAGESIC - DOSED MCG/HR) 12 MCG/HR. Pharmacy advised they would be faxing the denial information. Please advise. Thanks TNP

## 2017-09-22 MED ORDER — FENTANYL 12 MCG/HR TD PT72: 12.5000 ug | MEDICATED_PATCH | TRANSDERMAL | 0 refills | Status: DC

## 2017-09-22 MED ORDER — FENTANYL 75 MCG/HR TD PT72: 75.0000 ug | MEDICATED_PATCH | TRANSDERMAL | 0 refills | Status: DC

## 2017-09-22 NOTE — Telephone Encounter (Signed)
Prescription was sent to [facility] 09-07-2017, but has not been dispensed since 08-24-2017. Have resent prescription.

## 2017-09-29 ENCOUNTER — Ambulatory Visit (INDEPENDENT_AMBULATORY_CARE_PROVIDER_SITE_OTHER): Payer: Medicare Other | Admitting: Internal Medicine

## 2017-09-29 ENCOUNTER — Encounter: Payer: Self-pay | Admitting: Internal Medicine

## 2017-09-29 VITALS — BP 118/70 | HR 77 | Ht 59.0 in | Wt 140.0 lb

## 2017-09-29 DIAGNOSIS — G4739 Other sleep apnea: Secondary | ICD-10-CM | POA: Diagnosis not present

## 2017-09-29 DIAGNOSIS — Q796 Ehlers-Danlos syndrome, unspecified: Secondary | ICD-10-CM

## 2017-09-29 DIAGNOSIS — R4589 Other symptoms and signs involving emotional state: Secondary | ICD-10-CM

## 2017-09-29 DIAGNOSIS — J3089 Other allergic rhinitis: Secondary | ICD-10-CM | POA: Diagnosis not present

## 2017-09-29 NOTE — Patient Instructions (Signed)
Order- Office spirometry   Fredderick Phenix- Danlos syndrome  Order- ABG on room air  Order- Schedule BIPAP ST Sleep Study protocol    Dx Mixed Sleep Apnea, Central predominant  Please call as needed

## 2017-09-29 NOTE — Assessment & Plan Note (Addendum)
She has significant central sleep apnea when asleep.  She and her mother note that she will "forget to breathe" while awake, and occasionally gasp.  There is an obstructive apnea component.  Her primary concern is daytime sleepiness but it is not clear if the apnea pattern is contributing to that.  She takes several potentially sedating medications.  If she is hypoventilating during the daytime that will be an additional problem.  We will check an arterial blood gas for CO2 retention.  If we can manage her central apnea with a BiPAP ST machine that may help her.  If insufficient, we will consider a Trilogy NIV machine. Plan-office spirometry done showing restriction.  ABG on room air while awake.  Schedule BiPAP ST titration study.

## 2017-09-29 NOTE — Assessment & Plan Note (Signed)
There is also some septal deviation.   Plan-she can explore with how much her nasal airway can be improved by trying Breathe Right nasal strips, or conservative occasional use of Afrin at bedtime as discussed.

## 2017-09-29 NOTE — Progress Notes (Signed)
09/29/17-42 year old female never smoker comes with her mother for sleep evaluation at the kind request of Dr. Gaynell Face and previously discussed by phone with him..  The sleep study had showed severe mixed/Central predominant apnea.  Complicating medical problems include Ehlers Danlos syndrome, fibromyalgia, seasonal allergic rhinitis, asthma, degenerative disc disease, rheumatoid arthritis, question of past seizure disorder, Tics She complains of excessive somnolence and has required narcotics for pain control. NPSG 08/20/17-severe Mixed Sleep apnea (Central predominant)-AHI 134.1./hour, desaturation to 80%, 38.9 minutes recorded <= 88%, body weight 140 pounds, Central Apnea Index 119.8/ hr She takes medications at night to help her self sleep thin feels drowsy the next day.  Aware of daytime sleepiness particularly over the past 5 months. Bedtime between 11 PM and 2 AM, estimating sleep latency 1 hour, waking 2-4 times during the night before up between 5 and 6:30 AM. Says she "forgets to breathe" even when awake.  Not sure about snoring but cannot breathe easily through her nose.  Mild intermittent asthma pattern treated with occasional rescue inhaler. Epworth score 22. Office Spirometry 09/29/17-mild restriction of exhaled volume.  Flow is normal for volume.  FVC 2.29/74%, FEV1 2.0/78%, ratio 2.87, FEF 25-75% 2.87/101%  Prior to Admission medications   Medication Sig Start Date End Date Taking? Authorizing Provider  ADVAIR DISKUS 250-50 MCG/DOSE AEPB INHALE 1 PUFF TWICE A DAY 08/07/15  Yes Birdie Sons, MD  albuterol (VENTOLIN HFA) 108 (90 Base) MCG/ACT inhaler Inhale 2 puffs into the lungs every 6 (six) hours as needed. 04/21/17  Yes Birdie Sons, MD  busPIRone (BUSPAR) 5 MG tablet Take 2 tablets (10 mg total) by mouth 2 (two) times daily. 06/14/17  Yes Birdie Sons, MD  CAMILA 0.35 MG tablet TAKE 1 TABLET (0.35 MG TOTAL) BY MOUTH DAILY. 03/04/17  Yes Birdie Sons, MD  celecoxib  (CELEBREX) 200 MG capsule TAKE 1 CAPSULE BY MOUTH 2 TIMES A DAY 09/19/17  Yes Birdie Sons, MD  DEXILANT 30 MG capsule TAKE ONE CAPSULE BY MOUTH DAILY 04/27/17  Yes Birdie Sons, MD  diclofenac sodium (VOLTAREN) 1 % GEL APPLY AS NEEDED 3 TIMES A DAY 09/01/17  Yes Birdie Sons, MD  DULoxetine (CYMBALTA) 60 MG capsule TAKE 2 CAPSULES (120 MG TOTAL) BY MOUTH DAILY. 02/01/17  Yes Birdie Sons, MD  fentaNYL (DURAGESIC - DOSED MCG/HR) 12 MCG/HR Place 1 patch (12.5 mcg total) onto the skin every 3 (three) days. 09/22/17  Yes Birdie Sons, MD  fentaNYL (DURAGESIC - DOSED MCG/HR) 75 MCG/HR Place 1 patch (75 mcg total) onto the skin every 3 (three) days. 09/22/17  Yes Birdie Sons, MD  fexofenadine (ALLEGRA) 180 MG tablet Take 180 mg by mouth daily.   Yes [provider]  fluticasone (FLONASE) 50 MCG/ACT nasal spray Place 2 sprays into both nostrils daily.   Yes [provider]  gabapentin (NEURONTIN) 600 MG tablet TAKE 2 TABLETS BY MOUTH EVERY MORNING AND 3 TABLETS AT BEDTIME. 06/28/17  Yes Hickling, Princess Bruins, MD  haloperidol (HALDOL) 0.5 MG tablet Take 0.5 tablets (0.25 mg total) 2 (two) times daily by mouth. 06/28/17  Yes Hickling, Princess Bruins, MD  loratadine (CLARITIN) 10 MG tablet Take 10 mg by mouth daily.   Yes [provider]  LYRICA 300 MG capsule TAKE 1 CAPSULE BY MOUTH TWICE DAILY 08/24/17  Yes Birdie Sons, MD  omeprazole (PRILOSEC) 40 MG capsule Take 40 mg by mouth 2 (two) times daily.    Yes [provider]  ondansetron (ZOFRAN-ODT) 4 MG disintegrating tablet DISSOLVE 1 TABLET IN MOUTH EVERY 8 HOURS FOR NAUSEA AND VOMITING 02/16/17  Yes Birdie Sons, MD  QUEtiapine (SEROQUEL) 200 MG tablet TAKE 1 TABLET (200 MG TOTAL) BY MOUTH AT BEDTIME. 09/01/17  Yes Birdie Sons, MD  tiZANidine (ZANAFLEX) 4 MG tablet TAKE 1/2 TO 1 TABLET BY MOUTH EVERY EIGHT HOURS 09/20/17  Yes Birdie Sons, MD  Eszopiclone 3 MG TABS TAKE 1 TABLET BY MOUTH EACH NIGHT  AT BEDTIME AS NEEDED Patient not taking: Reported on 09/29/2017 09/25/16   Birdie Sons, MD   Past Medical History:  Diagnosis Date  . Ehlers-Danlos syndrome   . Headache   . Tics of organic origin   . Transient alteration of awareness    Past Surgical History:  Procedure Laterality Date  . DILATION AND CURETTAGE OF UTERUS  2009  . EYE SURGERY Left 1990   3 Surgeries on left eye to correct crossed eye  . LAPAROSCOPY Left 2003   Fallopian Tube  . MANDIBLE SURGERY  1998  . OTHER SURGICAL HISTORY Right 1987   3 Surgeries to repair broken arm  . OTHER SURGICAL HISTORY Left    3 surgeries on her left thigh as an infant  . OTHER SURGICAL HISTORY  1998   Jaw surgery  . Right arm surgery  1987   x 3 due to fracture  . TONSILLECTOMY  12/13/2002   Family History  Problem Relation Age of Onset  . Depression Mother   . Stroke Mother   . Fibromyalgia Mother   . Osteoarthritis Mother   . Asthma Brother   . Depression Brother   . Migraines Brother   . Asperger's syndrome Brother   . Other Brother        Ehlers-Danlos Syndrome  . Cancer Maternal Aunt   . Asperger's syndrome Maternal Aunt   . Heart disease Paternal Aunt   . Heart disease Paternal Uncle   . Cervical cancer Maternal Grandmother   . Osteoporosis Maternal Grandmother        Died at 62  . Osteoarthritis Maternal Grandmother   . Colon cancer Maternal Grandfather   . Asthma Maternal Grandfather   . Asperger's syndrome Maternal Grandfather   . Emphysema Maternal Grandfather        Died at 35  . Prostate cancer Maternal Grandfather   . Heart attack Paternal Grandfather        Died at 13  . Asthma Paternal Grandfather   . Tuberculosis Paternal Grandfather   . Cancer Cousin   . Asperger's syndrome Cousin   . Asperger's syndrome Other   . Heart Problems Paternal Grandmother        Died at 60  . Ehlers-Danlos syndrome Father   . Ehlers-Danlos syndrome Brother    Social History   Socioeconomic History  .  Marital status: Single    Spouse name: Not on file  . Number of children: Not on file  . Years of education: Not on file  . Highest education level: Not on file  Social Needs  . Financial resource strain: Not on file  . Food insecurity - worry: Not on file  . Food insecurity - inability: Not on file  . Transportation needs - medical: Not on file  . Transportation needs - non-medical: Not on file  Occupational History  . Not on file  Tobacco Use  . Smoking status: Never Smoker  . Smokeless tobacco: Never Used  Substance and Sexual Activity  .  Alcohol use: No    Alcohol/week: 0.0 oz  . Drug use: No  . Sexual activity: No  Other Topics Concern  . 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    She lives with her mother.    She enjoys reading, watching TV, writing, and painting.    ROS-see HPI  + = positive Constitutional:    weight loss, night sweats, fevers, chills, +fatigue, lassitude. HEENT:    headaches, difficulty swallowing, tooth/dental problems, sore throat,       sneezing, itching, ear ache, nasal congestion, post nasal drip, snoring CV:    chest pain, orthopnea, PND, swelling in lower extremities, anasarca,                                  dizziness, palpitations Resp:  + shortness of breath with exertion or at rest.                productive cough,   non-productive cough, coughing up of blood.              change in color of mucus.  wheezing.   Skin:    rash or lesions. GI:  No-   heartburn, indigestion, abdominal pain, nausea, vomiting, diarrhea,                 change in bowel habits, loss of appetite GU: dysuria, change in color of urine, no urgency or frequency.   flank pain. MS:   joint pain, stiffness, decreased range of motion, back pain. Neuro-     nothing unusual Psych:  change in mood or affect.  depression or anxiety.   memory loss.  OBJ- Physical Exam General- Awake, Oriented, Affect-appropriate, Distress- none acute,                  +Eyelid droop makes her look drowsy. +Power chair.  Skin- rash-none, lesions- none, excoriation- none Lymphadenopathy- none Head- atraumatic            Eyes- Gross vision intact, PERRLA, conjunctivae and secretions clear            Ears- Hearing, canals-normal            Nose- + mild turbinate edemea, +Septal dev, mucus, polyps, erosion, perforation             Throat- Mallampati III , mucosa clear , drainage- none, tonsils- atrophic Neck- flexible , trachea midline, no stridor , thyroid nl, carotid no bruit Chest - symmetrical excursion , unlabored           Heart/CV- RRR , no murmur heard , no gallop  , no rub, nl s1 s2                           - JVD- none , edema- none, stasis changes- none, varices- none           Lung- clear to P&A, wheeze- none, cough- none , dullness-none, rub- none           Chest wall-  Abd-  Br/ Gen/ Rectal- Not done, not indicated Extrem- cyanosis- none, clubbing, none, atrophy- none, strength- nl Neuro- grossly intact to observation

## 2017-10-02 ENCOUNTER — Encounter (HOSPITAL_COMMUNITY): Payer: Self-pay

## 2017-10-04 ENCOUNTER — Ambulatory Visit (HOSPITAL_COMMUNITY)
Admission: RE | Admit: 2017-10-04 | Discharge: 2017-10-04 | Disposition: A | Discharge status: Home / Self Care | Payer: Medicare Other | Source: Ambulatory Visit | Attending: Internal Medicine | Admitting: Internal Medicine

## 2017-10-04 DIAGNOSIS — Q796 Ehlers-Danlos syndrome, unspecified: Secondary | ICD-10-CM

## 2017-10-04 DIAGNOSIS — Z0189 Encounter for other specified special examinations: Secondary | ICD-10-CM | POA: Diagnosis not present

## 2017-10-04 LAB — BLOOD GAS, ARTERIAL
Acid-Base Excess: 0.7 mmol/L (ref 0.0–2.0)
Bicarbonate: 27.4 mmol/L (ref 20.0–28.0)
Drawn by: 244901
FIO2: 21
O2 Saturation: 90.9 %
Patient temperature: 98.6
pCO2 arterial: 56.4 mmHg — ABNORMAL HIGH (ref 32.0–48.0)
pH, Arterial: 7.308 — ABNORMAL LOW (ref 7.350–7.450)
pO2, Arterial: 67.6 mmHg — ABNORMAL LOW (ref 83.0–108.0)

## 2017-10-06 NOTE — Progress Notes (Signed)
LMTCB on preferred phone number listed for patient. 

## 2017-10-11 ENCOUNTER — Ambulatory Visit (HOSPITAL_BASED_OUTPATIENT_CLINIC_OR_DEPARTMENT_OTHER): Payer: Medicare Other | Attending: Internal Medicine | Admitting: Internal Medicine

## 2017-10-11 DIAGNOSIS — R4589 Other symptoms and signs involving emotional state: Secondary | ICD-10-CM

## 2017-10-11 DIAGNOSIS — G4739 Other sleep apnea: Secondary | ICD-10-CM

## 2017-10-11 DIAGNOSIS — Z79899 Other long term (current) drug therapy: Secondary | ICD-10-CM | POA: Diagnosis not present

## 2017-10-11 DIAGNOSIS — G4731 Primary central sleep apnea: Secondary | ICD-10-CM | POA: Diagnosis not present

## 2017-10-11 DIAGNOSIS — G4733 Obstructive sleep apnea (adult) (pediatric): Secondary | ICD-10-CM | POA: Insufficient documentation

## 2017-10-18 DIAGNOSIS — G4739 Other sleep apnea: Secondary | ICD-10-CM | POA: Diagnosis not present

## 2017-10-18 NOTE — Procedures (Signed)
Patient Name: Karen [name], Karen [name] Date: 10/11/2017 Gender: Female D.O.B: [birth year] Age (years): 8 Referring Provider: Baird Lyons MD, ABSM Height (inches): 60 Interpreting Physician: Baird Lyons MD, ABSM Weight (lbs): 140 RPSGT: Laren Everts BMI: 28 MRN: [id number] Neck Size: 14.00 <br> <br> CLINICAL INFORMATION The patient is referred for a BiPAP titration to treat sleep apnea.  Date of NPSG, Split Night or HST:  08/20/17 Severe Mixed Sleep Apnea, Central predominant. AHI 134.1/ hr, desaturation to 80%, body weight 140 lbs.  SLEEP STUDY TECHNIQUE As per the AASM Manual for the Scoring of Sleep and Associated Events v2.3 (April 2016) with a hypopnea requiring 4% desaturations.  The channels recorded and monitored were frontal, central and occipital EEG, electrooculogram (EOG), submentalis EMG (chin), nasal and oral airflow, thoracic and abdominal wall motion, anterior tibialis EMG, snore microphone, electrocardiogram, and pulse oximetry. Bilevel positive airway pressure (BPAP) was initiated at the beginning of the study and titrated to treat sleep-disordered breathing.  MEDICATIONS Medications self-administered by patient taken the night of the study : SEROQUEL  RESPIRATORY PARAMETERS Optimal IPAP Pressure (cm): 24 AHI at Optimal Pressure (/hr) 0.0 Optimal EPAP Pressure (cm): 18   Overall Minimal O2 (%): 81.0 Minimal O2 at Optimal Pressure (%): 87.0 SLEEP ARCHITECTURE Start Time: 10:16:53 PM Stop Time: 5:23:10 AM Total Time (min): 426.3 Total Sleep Time (min): 377.1 Sleep Latency (min): 6.7 Sleep Efficiency (%): 88.5% REM Latency (min): N/A WASO (min): 42.5 Stage N1 (%): 3.2% Stage N2 (%): 87.5% Stage N3 (%): 9.3% Stage R (%): 0.00 Supine (%): 16.00 Arousal Index (/hr): 8.6   CARDIAC DATA The 2 lead EKG demonstrated sinus rhythm. The mean heart rate was 79.2 beats per minute. Other EKG findings include: None.  LEG MOVEMENT DATA The total Periodic Limb Movements  of Sleep (PLMS) were 0. The PLMS index was 0.0. A PLMS index of <15 is considered normal in adults.  IMPRESSIONS - An optimal BiPAP pressure was selected for this patient ( 24 / 18 cm of water), back up rate 16.  - Mild Central Sleep Apnea was noted during this titration (CAI = 13.7/h). - Moderate oxygen desaturations were observed during this titration (min O2 = 81.0%). At final BIPAP ST settings Mean O2 sat 91.8, with 0.4 minutes O2 sat below 88%. - No snoring was audible during this study. - No cardiac abnormalities were observed during this study. - Clinically significant periodic limb movements were not noted during this study. Arousals associated with PLMs were rare.  DIAGNOSIS - Obstructive Sleep Apnea (327.23 [G47.33 ICD-10])  RECOMMENDATIONS - Trial of BiPAP ST therapy on 24/18 cm H2O, back up rate 16, TI max 2.0, TI min 0.3, Rise time 400, Trigger Medium cycle. Paient wore a Small size Resmed Full Face Mask AirFit F20 mask and heated humidification. - Be careful with alcohol, sedatives and other CNS depressants that may worsen sleep apnea and disrupt normal sleep architecture. - Sleep hygiene should be reviewed to assess factors that may improve sleep quality. - Weight management and regular exercise should be initiated or continued.  [Electronically signed] 10/18/2017 01:55 PM  Baird Lyons MD, [place], American Board of Sleep Medicine   NPI: [id number]                          [place], [place] [facility]  ELECTRONICALLY SIGNED ON:  10/18/2017, 1:42 PM [place] PH: [phone]   FX: [phone] [place]  [place]

## 2017-10-23 ENCOUNTER — Other Ambulatory Visit: Payer: Self-pay | Admitting: Family Medicine

## 2017-10-23 MED ORDER — FENTANYL 75 MCG/HR TD PT72: 75.0000 ug | MEDICATED_PATCH | TRANSDERMAL | 0 refills | Status: DC

## 2017-10-23 MED ORDER — FENTANYL 12 MCG/HR TD PT72: 12.5000 ug | MEDICATED_PATCH | TRANSDERMAL | 0 refills | Status: DC

## 2017-10-23 NOTE — Telephone Encounter (Signed)
Pt contacted office for refill request on the following medications:  1. fentaNYL (DURAGESIC - DOSED MCG/HR) 12 MCG/HR 2. fentaNYL ([place] - DOSED MCG/HR) 75 MCG/HR   [facility] [place].  Last Rx: 09/22/17 Pt stated she will need the medication by tomorrow. Please advise. Thanks TNP

## 2017-10-28 ENCOUNTER — Encounter: Payer: Self-pay | Admitting: Emergency Medicine

## 2017-10-28 ENCOUNTER — Emergency Department
Admission: EM | Admit: 2017-10-28 | Discharge: 2017-10-28 | Disposition: A | Discharge status: Home / Self Care | Payer: Medicare Other | Attending: Emergency Medicine | Admitting: Emergency Medicine

## 2017-10-28 ENCOUNTER — Telehealth: Payer: Self-pay | Admitting: Pulmonary Disease

## 2017-10-28 ENCOUNTER — Emergency Department: Payer: Medicare Other

## 2017-10-28 ENCOUNTER — Other Ambulatory Visit: Payer: Self-pay

## 2017-10-28 DIAGNOSIS — R5383 Other fatigue: Secondary | ICD-10-CM | POA: Insufficient documentation

## 2017-10-28 DIAGNOSIS — K209 Esophagitis, unspecified without bleeding: Secondary | ICD-10-CM

## 2017-10-28 DIAGNOSIS — R0602 Shortness of breath: Secondary | ICD-10-CM | POA: Diagnosis not present

## 2017-10-28 DIAGNOSIS — Z79899 Other long term (current) drug therapy: Secondary | ICD-10-CM | POA: Insufficient documentation

## 2017-10-28 DIAGNOSIS — J45909 Unspecified asthma, uncomplicated: Secondary | ICD-10-CM | POA: Diagnosis not present

## 2017-10-28 DIAGNOSIS — R079 Chest pain, unspecified: Secondary | ICD-10-CM

## 2017-10-28 HISTORY — DX: Sleep apnea, unspecified: G47.30

## 2017-10-28 LAB — CBC WITH DIFFERENTIAL/PLATELET
Basophils Absolute: 0 10*3/uL (ref 0–0.1)
Basophils Relative: 0 %
Eosinophils Absolute: 0.3 10*3/uL (ref 0–0.7)
Eosinophils Relative: 4 %
HCT: 41 % (ref 35.0–47.0)
Hemoglobin: 13.3 g/dL (ref 12.0–16.0)
Lymphocytes Relative: 28 %
Lymphs Abs: 2.2 10*3/uL (ref 1.0–3.6)
MCH: 27.5 pg (ref 26.0–34.0)
MCHC: 32.5 g/dL (ref 32.0–36.0)
MCV: 84.6 fL (ref 80.0–100.0)
Monocytes Absolute: 0.5 10*3/uL (ref 0.2–0.9)
Monocytes Relative: 6 %
Neutro Abs: 4.8 10*3/uL (ref 1.4–6.5)
Neutrophils Relative %: 62 %
Platelets: 242 10*3/uL (ref 150–440)
RBC: 4.85 MIL/uL (ref 3.80–5.20)
RDW: 14.3 % (ref 11.5–14.5)
WBC: 7.9 10*3/uL (ref 3.6–11.0)

## 2017-10-28 LAB — URINALYSIS, COMPLETE (UACMP) WITH MICROSCOPIC
Bilirubin Urine: NEGATIVE
Glucose, UA: NEGATIVE mg/dL
Hgb urine dipstick: NEGATIVE
Ketones, ur: NEGATIVE mg/dL
Nitrite: NEGATIVE
Protein, ur: NEGATIVE mg/dL
Specific Gravity, Urine: 1.009 (ref 1.005–1.030)
pH: 5 (ref 5.0–8.0)

## 2017-10-28 LAB — BASIC METABOLIC PANEL
Anion gap: 8 (ref 5–15)
BUN: 14 mg/dL (ref 6–20)
CO2: 27 mmol/L (ref 22–32)
Calcium: 9.3 mg/dL (ref 8.9–10.3)
Chloride: 104 mmol/L (ref 101–111)
Creatinine, Ser: 0.95 mg/dL (ref 0.44–1.00)
GFR calc Af Amer: >60 mL/min (ref >60)
GFR calc non Af Amer: >60 mL/min (ref >60)
Glucose, Bld: 75 mg/dL (ref 65–99)
Potassium: 4.2 mmol/L (ref 3.5–5.1)
Sodium: 139 mmol/L (ref 135–145)

## 2017-10-28 LAB — TROPONIN I: Troponin I: <0.03 ng/mL (ref <0.03)

## 2017-10-28 MED ORDER — SUCRALFATE 1 G PO TABS: 1.0000 g | ORAL_TABLET | Freq: Four times a day (QID) | ORAL | 0 refills | Status: DC

## 2017-10-28 MED ORDER — GI COCKTAIL ~~LOC~~
30.0000 mL | Freq: Once | ORAL | Status: AC
  Administered 2017-10-28: 30 mL via ORAL
  Filled 2017-10-28: qty 30

## 2017-10-28 NOTE — ED Notes (Signed)
RT here to draw ABG

## 2017-10-28 NOTE — ED Notes (Signed)
Dr. Goodman at bedside.  

## 2017-10-28 NOTE — ED Notes (Signed)
RT called to draw ABG.  

## 2017-10-28 NOTE — ED Triage Notes (Signed)
Pt to ED via POV, pt mother states that pt recently had a sleep study done and it showed that she had neural sleep apnea. Pt had blood gas done 2 weeks ago and her CO2 was 57. Pt is waiting to get her Bi-pap but it has not come in yet. Pt mother states that pt has been having increased shortness of breath. Pts pulmonologist advised mother to bring pt to the ED. Pt in NAD at this time, able to speak in complete sentences

## 2017-10-28 NOTE — ED Notes (Signed)
RN approached  Pt  And pt's mother RN tried to find more information about pt's symptoms reports she has been short of breath for past 4 days went to sleep center

## 2017-10-28 NOTE — ED Provider Notes (Signed)
[facility] Emergency Department Provider Note   ____________________________________________   I have reviewed the triage vital signs and the nursing notes.   HISTORY  Chief Complaint Chest pain  History limited by: Not Limited   HPI Karen [name] is a 42 y.o. female who presents to the emergency department today with primary complaint of chest pain. It has been present for the past four days. Located in her central chest. In addition to the chest pain the patient has also been dealing with increasing shortness of breath and fatigue. Has been seen by a pulmonologist and has had sleep study performed. They do have concern for mixed sleep apnea. The patient and her mother have concern that her PCO2 might be more elevated today than it was on 2/20 of this year.   Per medical record review patient has a history of ehlers-danlos, sleep apnea.   Past Medical History:  Diagnosis Date  . Ehlers-Danlos syndrome   . Headache   . Sleep apnea   . Tics of organic origin   . Transient alteration of awareness     Patient Active Problem List   Diagnosis Date Noted  . Excessive somnolence disorder 06/28/2017  . Seizures ([place]) 09/12/2016  . Sinusitis 09/12/2016  . Difficulty hearing 08/31/2015  . Back pain, chronic 08/31/2015  . Rheumatoid arthritis ([place]) 08/31/2015  . Abdominal pain 03/10/2015  . Alopecia 03/10/2015  . Anemia 03/10/2015  . Arthritis 03/10/2015  . Asthma 03/10/2015  . Chronic nausea 03/10/2015  . DDD (degenerative disc disease), lumbar 03/10/2015  . Depression 03/10/2015  . Personal history of MRSA (methicillin resistant Staphylococcus aureus) 03/10/2015  . Insomnia 03/10/2015  . Mitral valve prolapse 03/10/2015  . Paresthesias/numbness 03/10/2015  . Allergic rhinitis, seasonal 03/10/2015  . Seizure disorder ([place]) 03/10/2015  . Mixed sleep apnea, Central predominant 03/10/2015  . Migraine without aura 01/27/2014  . Fibromyalgia  01/27/2014  . Transient alteration of awareness 01/07/2014  . Tics of organic origin 01/07/2014  . Backache, unspecified 01/07/2014  . Ehlers-Danlos syndrome 01/07/2014    Past Surgical History:  Procedure Laterality Date  . DILATION AND CURETTAGE OF UTERUS  2009  . EYE SURGERY Left 1990   3 Surgeries on left eye to correct crossed eye  . LAPAROSCOPY Left 2003   Fallopian Tube  . MANDIBLE SURGERY  1998  . OTHER SURGICAL HISTORY Right 1987   3 Surgeries to repair broken arm  . OTHER SURGICAL HISTORY Left    3 surgeries on her left thigh as an infant  . OTHER SURGICAL HISTORY  1998   Jaw surgery  . Right arm surgery  1987   x 3 due to fracture  . TONSILLECTOMY  12/13/2002    Prior to Admission medications   Medication Sig Start Date End Date Taking? Authorizing Provider  ADVAIR DISKUS 250-50 MCG/DOSE AEPB INHALE 1 PUFF TWICE A DAY 08/07/15   Birdie Sons, MD  albuterol (VENTOLIN HFA) 108 (90 Base) MCG/ACT inhaler Inhale 2 puffs into the lungs every 6 (six) hours as needed. 04/21/17   Birdie Sons, MD  busPIRone (BUSPAR) 5 MG tablet Take 2 tablets (10 mg total) by mouth 2 (two) times daily. 06/14/17   Birdie Sons, MD  CAMILA 0.35 MG tablet TAKE 1 TABLET (0.35 MG TOTAL) BY MOUTH DAILY. 03/04/17   Birdie Sons, MD  celecoxib (CELEBREX) 200 MG capsule TAKE 1 CAPSULE BY MOUTH 2 TIMES A DAY 09/19/17   Birdie Sons, MD  DEXILANT 30  MG capsule TAKE ONE CAPSULE BY MOUTH DAILY 04/27/17   Birdie Sons, MD  diclofenac sodium (VOLTAREN) 1 % GEL APPLY AS NEEDED 3 TIMES A DAY 09/01/17   Birdie Sons, MD  DULoxetine (CYMBALTA) 60 MG capsule TAKE 2 CAPSULES (120 MG TOTAL) BY MOUTH DAILY. 02/01/17   Birdie Sons, MD  Eszopiclone 3 MG TABS TAKE 1 TABLET BY MOUTH EACH NIGHT AT BEDTIME AS NEEDED Patient not taking: Reported on 09/29/2017 09/25/16   Birdie Sons, MD  fentaNYL (DURAGESIC - DOSED MCG/HR) 12 MCG/HR Place 1 patch (12.5 mcg total) onto the skin every 3 (three)  days. 10/23/17   Birdie Sons, MD  fentaNYL (DURAGESIC - DOSED MCG/HR) 75 MCG/HR Place 1 patch (75 mcg total) onto the skin every 3 (three) days. 10/23/17   Birdie Sons, MD  fexofenadine (ALLEGRA) 180 MG tablet Take 180 mg by mouth daily.    [provider]  fluticasone (FLONASE) 50 MCG/ACT nasal spray Place 2 sprays into both nostrils daily.    [provider]  gabapentin (NEURONTIN) 600 MG tablet TAKE 2 TABLETS BY MOUTH EVERY MORNING AND 3 TABLETS AT BEDTIME. 06/28/17   Jodi Geralds, MD  haloperidol (HALDOL) 0.5 MG tablet Take 0.5 tablets (0.25 mg total) 2 (two) times daily by mouth. 06/28/17   Jodi Geralds, MD  loratadine (CLARITIN) 10 MG tablet Take 10 mg by mouth daily.    [provider]  LYRICA 300 MG capsule TAKE 1 CAPSULE BY MOUTH TWICE DAILY 08/24/17   Birdie Sons, MD  omeprazole (PRILOSEC) 40 MG capsule Take 40 mg by mouth 2 (two) times daily.     [provider]  ondansetron (ZOFRAN-ODT) 4 MG disintegrating tablet DISSOLVE 1 TABLET IN MOUTH EVERY 8 HOURS FOR NAUSEA AND VOMITING 02/16/17   Birdie Sons, MD  QUEtiapine (SEROQUEL) 200 MG tablet TAKE 1 TABLET (200 MG TOTAL) BY MOUTH AT BEDTIME. 09/01/17   Birdie Sons, MD  tiZANidine (ZANAFLEX) 4 MG tablet TAKE 1/2 TO 1 TABLET BY MOUTH EVERY EIGHT HOURS 09/20/17   Birdie Sons, MD    Allergies Demerol [meperidine]; Keflex [cephalexin]; Morphine and related; Toradol [ketorolac tromethamine]; Augmentin  [amoxicillin-pot clavulanate]; Chocolate; Nsaids; Strawberry extract; and Tape  Family History  Problem Relation Age of Onset  . Depression Mother   . Stroke Mother   . Fibromyalgia Mother   . Osteoarthritis Mother   . Asthma Brother   . Depression Brother   . Migraines Brother   . Asperger's syndrome Brother   . Other Brother        Ehlers-Danlos Syndrome  . Cancer Maternal Aunt   . Asperger's syndrome Maternal Aunt   . Heart disease Paternal Aunt   . Heart  disease Paternal Uncle   . Cervical cancer Maternal Grandmother   . Osteoporosis Maternal Grandmother        Died at 7  . Osteoarthritis Maternal Grandmother   . Colon cancer Maternal Grandfather   . Asthma Maternal Grandfather   . Asperger's syndrome Maternal Grandfather   . Emphysema Maternal Grandfather        Died at 44  . Prostate cancer Maternal Grandfather   . Heart attack Paternal Grandfather        Died at 1  . Asthma Paternal Grandfather   . Tuberculosis Paternal Grandfather   . Cancer Cousin   . Asperger's syndrome Cousin   . Asperger's syndrome Other   . Heart Problems Paternal Grandmother  Died at 20  . Ehlers-Danlos syndrome Father   . Ehlers-Danlos syndrome Brother     Social History Social History   Tobacco Use  . Smoking status: Never Smoker  . Smokeless tobacco: Never Used  Substance Use Topics  . Alcohol use: No    Alcohol/week: 0.0 oz  . Drug use: No    Review of Systems Constitutional: No fever/chills Eyes: No visual changes. ENT: No sore throat. Cardiovascular: Positive for chest pain. Respiratory: Positive for shortness of breath. Gastrointestinal: No abdominal pain.  No nausea, no vomiting.  No diarrhea.   Genitourinary: Negative for dysuria. Musculoskeletal: Negative for back pain. Skin: Negative for rash. Neurological: Positive for headache.  ____________________________________________   PHYSICAL EXAM:  VITAL SIGNS: ED Triage Vitals  Enc Vitals Group     BP 10/28/17 1704 106/73     Pulse Rate 10/28/17 1704 95     Resp 10/28/17 1704 16     Temp 10/28/17 1704 98.2 F (36.8 C)     Temp Source 10/28/17 1704 Oral     SpO2 10/28/17 1704 96 %     Weight 10/28/17 1705 140 lb (63.5 kg)     Height --      Head Circumference --      Peak Flow --      Pain Score 10/28/17 2045 7   Constitutional: Alert and oriented. Well appearing and in no distress. Eyes: Conjunctivae are normal.  ENT   Head: Normocephalic and  atraumatic.   Nose: No congestion/rhinnorhea.   Mouth/Throat: Mucous membranes are moist.   Neck: No stridor. Hematological/Lymphatic/Immunilogical: No cervical lymphadenopathy. Cardiovascular: Normal rate, regular rhythm.  No murmurs, rubs, or gallops.  Respiratory: Normal respiratory effort without tachypnea nor retractions. Breath sounds are clear and equal bilaterally. No wheezes/rales/rhonchi. Gastrointestinal: Soft and non tender. No rebound. No guarding.  Genitourinary: Deferred Musculoskeletal: Normal range of motion in all extremities. No lower extremity edema. Neurologic:  Normal speech and language. No gross focal neurologic deficits are appreciated.  Skin:  Skin is warm, dry and intact. No rash noted. Psychiatric: Mood and affect are normal. Speech and behavior are normal. Patient exhibits appropriate insight and judgment.  ____________________________________________    LABS (pertinent positives/negatives)  BMP wnl CBC wnl Trop <0.03 UA large leukocytes, wbc too numerous to count, WBC clumps present ABG pco2 48, p02 52, pH 7.39 ____________________________________________   EKG  I, Nance Pear, attending physician, personally viewed and interpreted this EKG  EKG Time: 1719 Rate: 82 Rhythm: normal sinus rhythm Axis: normal Intervals: qtc 415 QRS: narrow ST changes: no st elevation, t wave inversion V1, v2 Impression: abnormal ekg   ____________________________________________    RADIOLOGY  None  ____________________________________________   PROCEDURES  Procedures  ____________________________________________   INITIAL IMPRESSION / ASSESSMENT AND PLAN / ED COURSE  Pertinent labs & imaging results that were available during my care of the patient were reviewed by me and considered in my medical decision making (see chart for details).  Patient presented to the emergency department today because of concerns for chest pain shortness  of breath and increasing fatigue.  In terms of the chest pain differential would be broad.  Did consider ACS, PE, pneumothorax, pneumonia, esophagitis.  Patient did feel better after GI cocktail.  Chest x-ray without concerning findings.  At this point I think PE less likely.  Especially given relief with GI cocktail.  In terms of the patient's fatigue her CO2 had improved over ABG performed a few weeks ago.  PaO2 was  a little bit lower.  I did discuss briefly with Dr. Jefferson Fuel with pulmonology. At this time patient is awaiting bipap. Will plan on discharging home. Will send urine for culture.    ____________________________________________   FINAL CLINICAL IMPRESSION(S) / ED DIAGNOSES  Final diagnoses:  Shortness of breath  Nonspecific chest pain  Esophagitis     Note: This dictation was prepared with Dragon dictation. Any transcriptional errors that result from this process are unintentional     Nance Pear, MD 10/28/17 2359

## 2017-10-28 NOTE — ED Notes (Signed)
DR Archie Balboa at bedside.

## 2017-10-28 NOTE — Discharge Instructions (Addendum)
Please seek medical attention for any high fevers, chest pain, shortness of breath, change in behavior, persistent vomiting, bloody stool or any other new or concerning symptoms.  

## 2017-10-28 NOTE — Telephone Encounter (Signed)
Received call from patient's mother who states that the patient has three days of chest pain and worsening shortness of breath and cough.  Instructed them to go to the ER.

## 2017-10-28 NOTE — ED Notes (Signed)
Pt's mother approached Rn very upset reports she should have gone to another hospital RN apologized for wait and informed that as soon a room becomes available we will take pt to room, pt's mother reports pt is not breathing well, RN has been observing pt and pt is talking with no distress to other patients in waiting room, pt's mother reports pt needs an AVG Rn informed once pt gets in room MD will further assess, pt's mother there has been nothing done for pt RN informed that blood work and urine was done and will recheck VS pt's mother very upset yelling in front of first nurse desk

## 2017-10-31 ENCOUNTER — Ambulatory Visit: Payer: Self-pay | Admitting: Internal Medicine

## 2017-10-31 ENCOUNTER — Telehealth: Payer: Self-pay | Admitting: Internal Medicine

## 2017-10-31 DIAGNOSIS — J9611 Chronic respiratory failure with hypoxia: Secondary | ICD-10-CM

## 2017-10-31 DIAGNOSIS — G4733 Obstructive sleep apnea (adult) (pediatric): Secondary | ICD-10-CM

## 2017-10-31 LAB — URINE CULTURE

## 2017-10-31 NOTE — Telephone Encounter (Signed)
LMTCB

## 2017-11-01 NOTE — Telephone Encounter (Signed)
lmtcb x2 

## 2017-11-02 NOTE — Telephone Encounter (Signed)
Pt mother returning call.Karen [name]  she can be reached @  [phone].Karen [name]

## 2017-11-02 NOTE — Telephone Encounter (Signed)
Called and spoke with Patient's Mother.  An appointment was made for May 6 at [facility].  I instructed the mother to call office when machine was delivered, to make sure this was within the 31-90 day preferred window.   Nothing further needed

## 2017-11-02 NOTE — Telephone Encounter (Signed)
Called and spoke with pt's mother, Karen [name]. Karen [name] states she was not made aware of 10/31/17 OV.  Pt was instructed by ED on 10/28/17 to call and reschedule.  First available with CY is 02/05/18. I also relayed sleep study results.  Order has been placed.   CY please advise on apt. Thanks    Notes recorded by Deneise Lever, MD on 10/31/2017 at 2:51 PM EDT BIPAP titration study indicates this may work to maintain her breathing during sleep. I was going to discuss it at office visit, missed today.  Recommend DME new BIPAP ST 24/13, PS 3, Back up rate 16, mask of choice, humidifier, supplies, AirView  Dx OSA and Chronic hypoxic respiratory failure. ABG documented CO2 retention.  Please make sure she has a return ov with me in 31-90 days (prefer next month to 6 weeks if we can).

## 2017-11-02 NOTE — Telephone Encounter (Signed)
lmtcb x3 

## 2017-11-02 NOTE — Telephone Encounter (Signed)
Katie- please see if we can get her within the 31-90 day window per my last phone comment.

## 2017-11-03 ENCOUNTER — Telehealth: Payer: Self-pay | Admitting: Internal Medicine

## 2017-11-03 DIAGNOSIS — J9611 Chronic respiratory failure with hypoxia: Secondary | ICD-10-CM

## 2017-11-03 DIAGNOSIS — G4733 Obstructive sleep apnea (adult) (pediatric): Secondary | ICD-10-CM

## 2017-11-03 NOTE — Telephone Encounter (Signed)
Spoke with Barbaraann Rondo at [facility], wants to clarify if pt needs a bipap or a bipap ST machine.  If pt needs a bipap ST machine, a new order needs to be placed with a backup rate and not a pressure support as ordered.    CY please advise.  Thanks!

## 2017-11-04 NOTE — Telephone Encounter (Signed)
Order is for BIPAP ST 24/13 back up rate of 16 for dx chronic hypoxic and hypercapneic respiratory failure, OSA. The ABG is in the labs.

## 2017-11-06 ENCOUNTER — Encounter (INDEPENDENT_AMBULATORY_CARE_PROVIDER_SITE_OTHER): Payer: Self-pay | Admitting: Pediatrics

## 2017-11-06 ENCOUNTER — Ambulatory Visit (INDEPENDENT_AMBULATORY_CARE_PROVIDER_SITE_OTHER): Payer: Medicare Other | Admitting: Pediatrics

## 2017-11-06 VITALS — BP 140/80 | HR 96 | Ht 59.5 in | Wt 140.0 lb

## 2017-11-06 DIAGNOSIS — M797 Fibromyalgia: Secondary | ICD-10-CM | POA: Diagnosis not present

## 2017-11-06 DIAGNOSIS — G4739 Other sleep apnea: Secondary | ICD-10-CM | POA: Diagnosis not present

## 2017-11-06 DIAGNOSIS — Q796 Ehlers-Danlos syndrome, unspecified: Secondary | ICD-10-CM

## 2017-11-06 DIAGNOSIS — G4710 Hypersomnia, unspecified: Secondary | ICD-10-CM

## 2017-11-06 NOTE — Telephone Encounter (Signed)
Order has been placed per CY. Nothing further was needed.
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
## 2017-11-06 NOTE — Patient Instructions (Signed)
The BiPAP should be helpful.  Please let me know if there is anything else that I can do in this regard.  Dr. Annamaria Boots should be your contact area and hopefully the home care agency that brings the device by will help you with setting this up and adjusting the mask so it fits well.  This is really important for long-term use.  It also needs to be properly cleaned.

## 2017-11-07 ENCOUNTER — Telehealth (INDEPENDENT_AMBULATORY_CARE_PROVIDER_SITE_OTHER): Payer: Self-pay | Admitting: Pediatrics

## 2017-11-07 NOTE — Telephone Encounter (Signed)
°  Who's calling (name and relationship to patient) : Mom/[name]  Best contact number: [phone]  Provider they see: Dr Gaynell Face  Reason for call: Mom called in requesting to let Dr Gaynell Face know that pt is getting her bypap tomorrow.

## 2017-11-08 LAB — BLOOD GAS, ARTERIAL
Acid-Base Excess: 3.3 mmol/L — ABNORMAL HIGH (ref 0.0–2.0)
Bicarbonate: 29.1 mmol/L — ABNORMAL HIGH (ref 20.0–28.0)
FIO2: 21
O2 Saturation: 86 %
Patient temperature: 37
pCO2 arterial: 48 mmHg (ref 32.0–48.0)
pH, Arterial: 7.39 (ref 7.350–7.450)
pO2, Arterial: 52 mmHg — ABNORMAL LOW (ref 83.0–108.0)

## 2017-11-09 ENCOUNTER — Telehealth (INDEPENDENT_AMBULATORY_CARE_PROVIDER_SITE_OTHER): Payer: Self-pay | Admitting: Pediatrics

## 2017-11-09 NOTE — Telephone Encounter (Signed)
Noted  

## 2017-11-09 NOTE — Telephone Encounter (Signed)
°  Who's calling (name and relationship to patient) : [name] (mom)  Best contact number: [phone]  Provider they see: Gaynell Face  Reason for call: She wanted to let Dr. Gaynell Face know that patient received her C-Pap machine and wanted to thank him for everything

## 2017-11-21 ENCOUNTER — Other Ambulatory Visit: Payer: Self-pay | Admitting: Family Medicine

## 2017-11-21 MED ORDER — FENTANYL 12 MCG/HR TD PT72: 12.5000 ug | MEDICATED_PATCH | TRANSDERMAL | 0 refills | Status: DC

## 2017-11-21 MED ORDER — FENTANYL 75 MCG/HR TD PT72: 75.0000 ug | MEDICATED_PATCH | TRANSDERMAL | 0 refills | Status: DC

## 2017-11-21 NOTE — Telephone Encounter (Signed)
Pt contacted office for refill request on the following medications:  1. fentaNYL (DURAGESIC - DOSED MCG/HR) 12 MCG/HR  2. fentaNYL ([place] - DOSED MCG/HR) 75 MCG/HR  [facility]  Please advise. Thanks TNP

## 2017-11-26 ENCOUNTER — Other Ambulatory Visit: Payer: Self-pay | Admitting: Family Medicine

## 2017-12-06 ENCOUNTER — Telehealth: Payer: Self-pay

## 2017-12-06 DIAGNOSIS — K209 Esophagitis, unspecified without bleeding: Secondary | ICD-10-CM

## 2017-12-06 NOTE — Telephone Encounter (Signed)
Patient called requesting to be referred to GI for esophagitis and stomach ulcers. Ok to refer? Please advise. Thanks!

## 2017-12-07 NOTE — Telephone Encounter (Signed)
Please advise 

## 2017-12-11 NOTE — Telephone Encounter (Signed)
Need to document what symptoms she is having.

## 2017-12-12 ENCOUNTER — Other Ambulatory Visit: Payer: Self-pay | Admitting: Family Medicine

## 2017-12-12 ENCOUNTER — Other Ambulatory Visit: Payer: Self-pay | Admitting: *Deleted

## 2017-12-12 MED ORDER — ALBUTEROL SULFATE HFA 108 (90 BASE) MCG/ACT IN AERS: 2.0000 | INHALATION_SPRAY | Freq: Four times a day (QID) | RESPIRATORY_TRACT | 5 refills | Status: DC | PRN

## 2017-12-12 MED ORDER — SUCRALFATE 1 G PO TABS: 1.0000 g | ORAL_TABLET | Freq: Four times a day (QID) | ORAL | 0 refills | Status: DC

## 2017-12-12 NOTE — Telephone Encounter (Signed)
See previous note. Referral already in epic.

## 2017-12-12 NOTE — Telephone Encounter (Signed)
Patient's mother Karen [name] stated patient has esophagitis. Patient was seen in ER 10/28/2017 for esophagitis. Patient is still having acid reflux and chest pain. Requesting to see GI for endoscopy. [name] also wanted to know if Dr. Caryn Section is aware of pt's sleep study results? Please advise?

## 2017-12-12 NOTE — Telephone Encounter (Signed)
Karen [name] was advised.

## 2017-12-12 NOTE — Telephone Encounter (Signed)
Have sent order for Karen [name] to work on GI referral. We doe have Dr. Bertrum Sol records and copy of her sleepy study.

## 2017-12-12 NOTE — Telephone Encounter (Signed)
Pt is requesting refill on albuterol (VENTOLIN HFA) 108 (90 Base) MCG/ACT inhaler.She would like this sent to [address].She would also like a referral to GI for esophogitis.She states she was seen in ER at [facility] March 2019

## 2017-12-17 ENCOUNTER — Encounter: Payer: Self-pay | Admitting: Internal Medicine

## 2017-12-18 ENCOUNTER — Other Ambulatory Visit: Payer: Self-pay | Admitting: Family Medicine

## 2017-12-18 ENCOUNTER — Encounter: Payer: Self-pay | Admitting: Internal Medicine

## 2017-12-18 ENCOUNTER — Ambulatory Visit (INDEPENDENT_AMBULATORY_CARE_PROVIDER_SITE_OTHER): Payer: Medicare Other | Admitting: Internal Medicine

## 2017-12-18 VITALS — BP 122/82 | HR 92

## 2017-12-18 DIAGNOSIS — G4733 Obstructive sleep apnea (adult) (pediatric): Secondary | ICD-10-CM

## 2017-12-18 DIAGNOSIS — J4530 Mild persistent asthma, uncomplicated: Secondary | ICD-10-CM

## 2017-12-18 DIAGNOSIS — G4710 Hypersomnia, unspecified: Secondary | ICD-10-CM | POA: Diagnosis not present

## 2017-12-18 DIAGNOSIS — G4739 Other sleep apnea: Secondary | ICD-10-CM

## 2017-12-18 MED ORDER — BUDESONIDE-FORMOTEROL FUMARATE 160-4.5 MCG/ACT IN AERO: 2.0000 | INHALATION_SPRAY | Freq: Two times a day (BID) | RESPIRATORY_TRACT | 0 refills | Status: DC

## 2017-12-18 MED ORDER — AMPHETAMINE-DEXTROAMPHET ER 15 MG PO CP24: 15.0000 mg | ORAL_CAPSULE | ORAL | 0 refills | Status: DC

## 2017-12-18 NOTE — Patient Instructions (Signed)
Sample and print script for Symbicort 160     Inhale 2 puffs then rinse mouth, twice daily  Ok to still use your albuterol rescue inhaler (Ventolin, ProAir)  2 puffs every 6 hours, if needed  Order [facility]- Please refit BIPAP mask for better and more comfortable seal.                                      Please provide her comparison information about Trilogy vent support in comparison with BiPAP ST.                                      Continue BIPAP ST 24/13, back up rate 16, mask of choice, humidifier, supplies, AirView  Script printed for Adderall XR 15 mg to try if needed for alertness

## 2017-12-18 NOTE — Progress Notes (Signed)
09/29/17-42 year old female never smoker comes with her mother for sleep evaluation at the kind request of Dr. Gaynell Face and previously discussed by phone with him..  The sleep study had showed severe mixed/Central predominant apnea.  Complicating medical problems include Ehlers Danlos syndrome, fibromyalgia, seasonal allergic rhinitis, asthma, degenerative disc disease, rheumatoid arthritis, question of past seizure disorder, Tics She complains of excessive somnolence and has required narcotics for pain control. NPSG 08/20/17-severe Mixed Sleep apnea (Central predominant)-AHI 134.1./hour, desaturation to 80%, 38.9 minutes recorded <= 88%, body weight 140 pounds, Central Apnea Index 119.8/ hr She takes medications at night to help her self sleep thin feels drowsy the next day.  Aware of daytime sleepiness particularly over the past 5 months. Bedtime between 11 PM and 2 AM, estimating sleep latency 1 hour, waking 2-4 times during the night before up between 5 and 6:30 AM. Says she "forgets to breathe" even when awake.  Not sure about snoring but cannot breathe easily through her nose.  Mild intermittent asthma pattern treated with occasional rescue inhaler. Epworth score 22. Office Spirometry 09/29/17-mild restriction of exhaled volume.  Flow is normal for volume.  FVC 2.29/74%, FEV1 2.0/78%, ratio 2.87, FEF 25-75% 2.87/101%   12/18/2017-42 year old female never smoker referred by Dr. Gaynell Face for management of severe mixed sleep apnea (central predominant), chronic hypoxic and hypercapnic respiratory failure, complicated by Ehlers-Danlos syndrome, fibromyalgia, seasonal allergic rhinitis, asthma, degenerative disc disease, rheumatoid arthritis, question of past seizure disorder, Tics BiPAP ST titration sleep study 10/11/2017-  Titrated to 24/ 13, back up rate 16 with minimal O2 sat 87%. Residual CAI 13.7/ hr. Download 93% compliance AHI 5.0/hour.  Mask is not sealing well and she says it hurts her nose because  she is trying to strap it on tightly.  Needs to be refit but it does help her sleep.  Mother still notices her dozing off during the day with pauses in her breathing then as well. Increased asthma.  Using mother's rescue inhaler.  ROS-see HPI  + = positive Constitutional:    weight loss, night sweats, fevers, chills, +fatigue, lassitude. HEENT:    headaches, difficulty swallowing, tooth/dental problems, sore throat,       sneezing, itching, ear ache, nasal congestion, post nasal drip, snoring CV:    chest pain, orthopnea, PND, swelling in lower extremities, anasarca,                                                       dizziness, palpitations Resp:  + shortness of breath with exertion or at rest.                productive cough,   non-productive cough, coughing up of blood.              change in color of mucus.  +wheezing.   Skin:    rash or lesions. GI:  No-   heartburn, indigestion, abdominal pain, nausea, vomiting, diarrhea,                 change in bowel habits, loss of appetite GU: dysuria, change in color of urine, no urgency or frequency.   flank pain. MS:   joint pain, stiffness, decreased range of motion, back pain. Neuro-     + HPI Psych:  change in mood or affect.  depression or anxiety.   memory  loss.  OBJ- Physical Exam General- Awake, Oriented, Affect-appropriate, Distress- none acute,                 +Eyelid droop makes her look drowsy. +Power chair.  Skin- rash-none, lesions- none, excoriation- none Lymphadenopathy- none Head- atraumatic            Eyes- Gross vision intact, PERRLA, conjunctivae and secretions clear            Ears- Hearing, canals-normal            Nose- + mild turbinate edemea, +Septal dev, mucus, polyps, erosion, perforation             Throat- Mallampati III , mucosa clear , drainage- none, tonsils- atrophic Neck- flexible , trachea midline, no stridor , thyroid nl, carotid no bruit Chest - symmetrical excursion , unlabored           Heart/CV-  RRR , no murmur heard , no gallop  , no rub, nl s1 s2                           - JVD- none , edema- none, stasis changes- none, varices- none           Lung- clear to P&A, wheeze- none, cough- none , dullness-none, rub- none           Chest wall-  Abd-  Br/ Gen/ Rectal- Not done, not indicated Extrem- cyanosis- none, clubbing, none, atrophy- none, strength- nl Neuro-+ very little muscle movement, slight face, but mentation seems appropriate.

## 2017-12-19 ENCOUNTER — Encounter: Payer: Self-pay | Admitting: Internal Medicine

## 2017-12-19 ENCOUNTER — Telehealth: Payer: Self-pay | Admitting: Internal Medicine

## 2017-12-19 DIAGNOSIS — I059 Rheumatic mitral valve disease, unspecified: Secondary | ICD-10-CM

## 2017-12-19 NOTE — Telephone Encounter (Signed)
Called and spoke with patients mother. She states that patients has mitral valve disorder and when they took the adderall prescription to the pharmacy the pharmacist said it was not safe for the patient to take.   Due to CY being out of the office MR can you advise on this, thanks.

## 2017-12-19 NOTE — Assessment & Plan Note (Signed)
Asthma is compounded by muscle weakness and hypoventilation but she may benefit from a maintenance inhaler. Plan-try Symbicort with discussion

## 2017-12-19 NOTE — Telephone Encounter (Signed)
Per MR routing to sleep specialist as CY will not be back in the office until Thursday.    VS please advise, thanks.

## 2017-12-19 NOTE — Assessment & Plan Note (Signed)
We are trying to remove any component of sleep apnea.  There is a residual component of nonspecific hypersomnia associated with hypoventilation.  We are going to let her try Adderall to see what this is like.

## 2017-12-19 NOTE — Assessment & Plan Note (Signed)
She is benefiting from BiPAP ST, sleeping better and breathing better at night.  We need to get her mask refit for better seal and better comfort.  She can use this device during the daytime when needed, since she is very inactive.  We did talk about availability of noninvasive ventilation such as a Trilogy.  Hopefully she can get some information about this from her DME company without having to physically go to them, since travel is difficult.  At her next visit here consider sending her across for an ABG on room air expecting we will document hypercapnia and hypoxia if she is really hyperventilating a lot.

## 2017-12-19 NOTE — Telephone Encounter (Signed)
Please route to a certified sleep sepcialist. I have no experience with sleep medicine or Mitral Valve but my sense is this can wait till 12/20/17 for Dr Annamaria Boots or route to a sleep doc in the clinic

## 2017-12-20 NOTE — Telephone Encounter (Signed)
Can wait until Dr. Annamaria Boots returns to the office to address.

## 2017-12-21 NOTE — Telephone Encounter (Signed)
LMTCB

## 2017-12-21 NOTE — Telephone Encounter (Signed)
I doubt this will be a problem, but need to look a little deeper.  Does she have a cardiologist?  Do they remember who and how long ago evaluated her heart murmur?

## 2017-12-21 NOTE — Telephone Encounter (Signed)
Will forward to Dr. Annamaria Boots to address.  LM to update pt and/or [name] that message has been sent to CY.

## 2017-12-22 NOTE — Telephone Encounter (Signed)
Ok- please order an echocardiogram and EKG    Dx mitral valve disease

## 2017-12-22 NOTE — Telephone Encounter (Signed)
Called and spoke to pt's mother, Jorene Guest. Informed her of the recs per CY. She states the pt does not have a cardiologist and the pt was diagnosed when she was 42yo. Pt does not have anyone to follow her regarding issue.   Dr. Annamaria Boots please advise if anything further is needed. Thanks.

## 2017-12-22 NOTE — Telephone Encounter (Signed)
Called and spoke to both pt and mother Jorene Guest stating we were going to have pt get an echo and ekg done.  Pt and mother both expressed understanding. Placed order for both ekg and echo and due to pt living in [place],  Used the location that was closest to pt's residence.  Nothing further needed at this time.

## 2017-12-25 ENCOUNTER — Other Ambulatory Visit: Payer: Self-pay

## 2018-01-02 ENCOUNTER — Ambulatory Visit (INDEPENDENT_AMBULATORY_CARE_PROVIDER_SITE_OTHER): Payer: Medicare Other

## 2018-01-02 ENCOUNTER — Other Ambulatory Visit: Payer: Self-pay

## 2018-01-02 DIAGNOSIS — I059 Rheumatic mitral valve disease, unspecified: Secondary | ICD-10-CM | POA: Diagnosis not present

## 2018-01-03 ENCOUNTER — Other Ambulatory Visit: Payer: Self-pay | Admitting: Family Medicine

## 2018-01-04 ENCOUNTER — Ambulatory Visit: Payer: Self-pay | Admitting: Internal Medicine

## 2018-01-04 ENCOUNTER — Other Ambulatory Visit: Payer: Self-pay | Admitting: Family Medicine

## 2018-01-04 ENCOUNTER — Other Ambulatory Visit (INDEPENDENT_AMBULATORY_CARE_PROVIDER_SITE_OTHER): Payer: Self-pay | Admitting: Pediatrics

## 2018-01-04 DIAGNOSIS — Q796 Ehlers-Danlos syndrome, unspecified: Secondary | ICD-10-CM

## 2018-01-04 DIAGNOSIS — M797 Fibromyalgia: Secondary | ICD-10-CM

## 2018-01-11 ENCOUNTER — Other Ambulatory Visit (INDEPENDENT_AMBULATORY_CARE_PROVIDER_SITE_OTHER): Payer: Self-pay | Admitting: Pediatrics

## 2018-01-11 DIAGNOSIS — G2569 Other tics of organic origin: Secondary | ICD-10-CM

## 2018-01-14 ENCOUNTER — Other Ambulatory Visit: Payer: Self-pay | Admitting: Family Medicine

## 2018-01-15 NOTE — Telephone Encounter (Signed)
Can you please double check patient and clarigy how much buspirone she is taking. Refill request is for 5mg  tablets, but we just sent in prescription for 10mg  tablets on 01-03-2018

## 2018-01-19 MED ORDER — BUSPIRONE HCL 10 MG PO TABS: 10.0000 mg | ORAL_TABLET | Freq: Two times a day (BID) | ORAL | 5 refills | Status: DC

## 2018-01-19 NOTE — Telephone Encounter (Signed)
Patient's mother stated pt is taking 10 mg bid. Patient also stated last refill was 12/18/2017 and they are due for refill now. However she still has a few pills left.

## 2018-01-22 ENCOUNTER — Other Ambulatory Visit: Payer: Self-pay | Admitting: *Deleted

## 2018-01-22 MED ORDER — FENTANYL 75 MCG/HR TD PT72: 75.0000 ug | MEDICATED_PATCH | TRANSDERMAL | 0 refills | Status: DC

## 2018-01-22 MED ORDER — FENTANYL 12 MCG/HR TD PT72: 12.5000 ug | MEDICATED_PATCH | TRANSDERMAL | 0 refills | Status: DC

## 2018-01-23 ENCOUNTER — Encounter: Payer: Self-pay | Admitting: Gastroenterology

## 2018-01-23 ENCOUNTER — Ambulatory Visit (INDEPENDENT_AMBULATORY_CARE_PROVIDER_SITE_OTHER): Payer: Medicare Other | Admitting: Gastroenterology

## 2018-01-23 ENCOUNTER — Ambulatory Visit: Payer: Medicare Other | Admitting: Gastroenterology

## 2018-01-23 ENCOUNTER — Other Ambulatory Visit: Payer: Self-pay

## 2018-01-23 VITALS — BP 115/79 | HR 101 | Ht 59.5 in | Wt 149.8 lb

## 2018-01-23 DIAGNOSIS — Z86010 Personal history of colonic polyps: Secondary | ICD-10-CM

## 2018-01-23 DIAGNOSIS — K219 Gastro-esophageal reflux disease without esophagitis: Secondary | ICD-10-CM | POA: Diagnosis not present

## 2018-01-23 NOTE — Progress Notes (Signed)
Gastroenterology Consultation  Referring Provider:     Birdie Sons, MD Primary Care Physician:  Birdie Sons, MD Primary Gastroenterologist:  Dr. Allen Norris     Reason for Consultation:     Heartburn        HPI:   Karen [name] is a 42 y.o. y/o female referred for consultation & management of Heartburn by Dr. Caryn Section, Kirstie Peri, MD.  This patient comes in today with a history of reflux and a family history of colon cancer.  The patient has a brother who had colon cancer in his 54s.  The patient is wheelchair-bound and suffers from Ehlers-Danlos syndrome.  The patient was recently in the emergency room and she states that she was told that she has esophagitis.  The patient had been followed by Dr. Gustavo Lah with her most recent visit with him back in 2015.  The patient also had an upper endoscopy in 2014 and a colonoscopy in 2011.  She reports that she takes Dexilant every morning with Zantac at night and has been put on Carafate in the ER.  The patient states that she does not notice a difference with the Carafate or the Zantac.  She states that most of her acid breakthrough is when she is laying down at night.  There is no report of any unexplained weight loss fevers chills nausea or vomiting. The patient also reports that she has a history of stomach ulcers with a upper endoscopy in her late teens.  Past Medical History:  Diagnosis Date  . Ehlers-Danlos syndrome   . Headache   . Sleep apnea   . Tics of organic origin   . Transient alteration of awareness     Past Surgical History:  Procedure Laterality Date  . DILATION AND CURETTAGE OF UTERUS  2009  . EYE SURGERY Left 1990   3 Surgeries on left eye to correct crossed eye  . LAPAROSCOPY Left 2003   Fallopian Tube  . MANDIBLE SURGERY  1998  . OTHER SURGICAL HISTORY Right 1987   3 Surgeries to repair broken arm  . OTHER SURGICAL HISTORY Left    3 surgeries on her left thigh as an infant  . OTHER SURGICAL HISTORY  1998   Jaw  surgery  . Right arm surgery  1987   x 3 due to fracture  . TONSILLECTOMY  12/13/2002    Prior to Admission medications   Medication Sig Start Date End Date Taking? Authorizing Provider  ADVAIR DISKUS 250-50 MCG/DOSE AEPB INHALE 1 PUFF TWICE A DAY 08/07/15  Yes Birdie Sons, MD  albuterol (VENTOLIN HFA) 108 (90 Base) MCG/ACT inhaler Inhale 2 puffs into the lungs every 6 (six) hours as needed. 12/12/17  Yes Birdie Sons, MD  amphetamine-dextroamphetamine (ADDERALL XR) 15 MG 24 hr capsule Take 1 capsule by mouth every morning. 12/18/17  Yes Young, Tarri Fuller D, MD  budesonide-formoterol [facility]) 160-4.5 MCG/ACT inhaler Inhale 2 puffs into the lungs 2 (two) times daily. 12/18/17  Yes Young, Tarri Fuller D, MD  busPIRone (BUSPAR) 10 MG tablet Take 1 tablet (10 mg total) by mouth 2 (two) times daily. 01/19/18  Yes Fisher, Kirstie Peri, MD  CAMILA 0.35 MG tablet TAKE 1 TABLET (0.35 MG TOTAL) BY MOUTH DAILY. 03/04/17  Yes Birdie Sons, MD  celecoxib (CELEBREX) 200 MG capsule TAKE 1 CAPSULE BY MOUTH 2 TIMES A DAY 09/19/17  Yes Birdie Sons, MD  DEXILANT 30 MG capsule TAKE ONE CAPSULE BY MOUTH DAILY 04/27/17  Yes Birdie Sons, MD  diclofenac sodium (VOLTAREN) 1 % GEL APPLY AS NEEDED 3 TIMES A DAY 11/26/17  Yes Birdie Sons, MD  DULoxetine (CYMBALTA) 60 MG capsule TAKE 2 CAPSULES (120 MG TOTAL) BY MOUTH DAILY. 02/01/17  Yes Birdie Sons, MD  fentaNYL (DURAGESIC - DOSED MCG/HR) 12 MCG/HR Place 1 patch (12.5 mcg total) onto the skin every 3 (three) days. 01/22/18  Yes Birdie Sons, MD  fentaNYL (DURAGESIC - DOSED MCG/HR) 75 MCG/HR Place 1 patch (75 mcg total) onto the skin every 3 (three) days. 01/22/18  Yes Birdie Sons, MD  fexofenadine (ALLEGRA) 180 MG tablet Take 180 mg by mouth daily.   Yes [provider]  fluticasone (FLONASE) 50 MCG/ACT nasal spray Place 2 sprays into both nostrils daily.   Yes [provider]  gabapentin (NEURONTIN) 600 MG tablet TAKE 2 TABLETS BY  MOUTH EVERY MORNING AND 3 TABLETS AT BEDTIME. 01/04/18  Yes Hickling, Princess Bruins, MD  haloperidol (HALDOL) 0.5 MG tablet TAKE 0.5 TABLETS (0.25 MG TOTAL) 2 (TWO) TIMES DAILY BY MOUTH. 01/11/18  Yes Jodi Geralds, MD  loratadine (CLARITIN) 10 MG tablet Take 10 mg by mouth daily.   Yes [provider]  LYRICA 300 MG capsule TAKE 1 CAPSULE BY MOUTH TWICE DAILY 08/24/17  Yes Birdie Sons, MD  ondansetron (ZOFRAN-ODT) 4 MG disintegrating tablet DISSOLVE 1 TABLET IN MOUTH EVERY 8 HOURS FOR NAUSEA AND VOMITING 02/16/17  Yes Birdie Sons, MD  QUEtiapine (SEROQUEL) 200 MG tablet TAKE 1 TABLET (200 MG TOTAL) BY MOUTH AT BEDTIME. 09/01/17  Yes Birdie Sons, MD  sucralfate (CARAFATE) 1 g tablet TAKE 1 TABLET (1 G TOTAL) BY MOUTH 4 (FOUR) TIMES DAILY. 01/04/18  Yes Birdie Sons, MD  tiZANidine (ZANAFLEX) 4 MG tablet TAKE 1/2 TO 1 TABLET BY MOUTH EVERY EIGHT HOURS 09/20/17  Yes Birdie Sons, MD    Family History  Problem Relation Age of Onset  . Depression Mother   . Stroke Mother   . Fibromyalgia Mother   . Osteoarthritis Mother   . Asthma Brother   . Depression Brother   . Migraines Brother   . Asperger's syndrome Brother   . Other Brother        Ehlers-Danlos Syndrome  . Cancer Maternal Aunt   . Asperger's syndrome Maternal Aunt   . Heart disease Paternal Aunt   . Heart disease Paternal Uncle   . Cervical cancer Maternal Grandmother   . Osteoporosis Maternal Grandmother        Died at 32  . Osteoarthritis Maternal Grandmother   . Colon cancer Maternal Grandfather   . Asthma Maternal Grandfather   . Asperger's syndrome Maternal Grandfather   . Emphysema Maternal Grandfather        Died at 76  . Prostate cancer Maternal Grandfather   . Heart attack Paternal Grandfather        Died at 2  . Asthma Paternal Grandfather   . Tuberculosis Paternal Grandfather   . Cancer Cousin   . Asperger's syndrome Cousin   . Asperger's syndrome Other   . Heart Problems Paternal  Grandmother        Died at 59  . Ehlers-Danlos syndrome Father   . Ehlers-Danlos syndrome Brother      Social History   Tobacco Use  . Smoking status: Never Smoker  . Smokeless tobacco: Never Used  Substance Use Topics  . Alcohol use: No    Alcohol/week: 0.0 oz  .  Drug use: No    Allergies as of 01/23/2018 - Review Complete 01/23/2018  Allergen Reaction Noted  . Chocolate flavor  03/18/2014  . Demerol [meperidine] Other (See Comments) 01/27/2014  . Keflex [cephalexin] Nausea And Vomiting 01/27/2014  . Morphine and related Nausea And Vomiting 01/27/2014  . Toradol [ketorolac tromethamine] Nausea And Vomiting 01/27/2014  . Augmentin  [amoxicillin-pot clavulanate]  03/10/2015  . Chocolate  03/10/2015  . Nsaids  03/10/2015  . Strawberry extract  03/10/2015  . Tape Dermatitis 07/29/2015    Review of Systems:    All systems reviewed and negative except where noted in HPI.   Physical Exam:  BP 115/79   Pulse (!) 101   Ht 4' 11.5" (1.511 m)   Wt 149 lb 12.8 oz (67.9 kg)   BMI 29.75 kg/m  No LMP recorded. (Menstrual status: Oral contraceptives). Psych:  Alert and cooperative. Normal mood and affect. General:   Alert,  Well-developed, well-nourished, pleasant and cooperative in NAD Head:  Normocephalic and atraumatic. Eyes:  Sclera clear, no icterus.   Conjunctiva pink. Ears:  Normal auditory acuity. Nose:  No deformity, discharge, or lesions. Mouth:  No deformity or lesions,oropharynx pink & moist. Neck:  Supple; no masses or thyromegaly. Lungs:  Respirations even and unlabored.  Clear throughout to auscultation.   No wheezes, crackles, or rhonchi. No acute distress. Heart:  Regular rate and rhythm; clicks, rubs, or gallops. Abdomen:  Normal bowel sounds.  No bruits.  Soft, non-tender and non-distended without masses, hepatosplenomegaly or hernias noted.  No guarding or rebound tenderness.  Negative Carnett sign.   Rectal:  Deferred.  Msk:  Symmetrical without gross  deformities.  Good, equal movement & strength bilaterally. Pulses:  Normal pulses noted. Extremities:  No clubbing or edema.  No cyanosis. Neurologic:  Alert and oriented x3;  Patient in a wheelchair Skin:  Intact without significant lesions or rashes.  No jaundice. Lymph Nodes:  No significant cervical adenopathy. Psych:  Alert and cooperative. Normal mood and affect.  Imaging Studies: No results found.  Assessment and Plan:   Karen [name] is a 42 y.o. y/o female who was recently in the emergency room and was told she had esophagitis.  The patient had a upper endoscopy and colonoscopy in the past by Dr. Gustavo Lah.  The patient has a family history of colon cancer.  The patient also reports that she has had polyps and ulcers in the past.  The patient has been told to take her Dexilant before she goes to sleep because most of her acid breakthrough is when she is laying down.  The patient will also be set up for colonoscopy.  If the changing in the timing of her medications does not improve her symptoms she will let me know and at that time will have a upper endoscopy Added to her colonoscopy.  The patient has been told to stop the Zantac and Carafate since she notices no improvement in her symptoms with these medications.  The patient and her mother have been explained the plan and agree with it.  [name], MD. [facility]   Note: This dictation was prepared with Dragon dictation along with smaller phrase technology. Any transcriptional errors that result from this process are unintentional.

## 2018-01-26 ENCOUNTER — Other Ambulatory Visit: Payer: Self-pay | Admitting: Family Medicine

## 2018-01-26 MED ORDER — NORETHINDRONE 0.35 MG PO TABS: 1.0000 | ORAL_TABLET | Freq: Every day | ORAL | 9 refills | Status: DC

## 2018-01-26 NOTE — Telephone Encounter (Signed)
[facility] faxed a refill request for the following medication. Thanks CC  CAMILA 0.35 MG tablet

## 2018-01-27 ENCOUNTER — Other Ambulatory Visit: Payer: Self-pay | Admitting: Family Medicine

## 2018-02-06 ENCOUNTER — Telehealth: Payer: Self-pay | Admitting: Internal Medicine

## 2018-02-06 MED ORDER — AMPHETAMINE-DEXTROAMPHET ER 15 MG PO CP24: 15.0000 mg | ORAL_CAPSULE | ORAL | 0 refills | Status: DC

## 2018-02-06 NOTE — Telephone Encounter (Signed)
The patient was advised that Dr. Annamaria Boots is not in the office this week.  She asked that we text her with an answer if this could be filled before he returns.  She became VERY ANGRY and began to yell at me when I advised her we do not have the capability to text patients.  She states her ringer does not work on either phone.  She asked that we ask another provider if they would be willing to fill this for the patient and she will call back tomorrow sometime for the answer.

## 2018-02-06 NOTE — Telephone Encounter (Signed)
Dr. Chase Caller, would you be willing to write Rx for pt since CY is out and you are the DOD for this afternoon? Please advise.

## 2018-02-06 NOTE — Telephone Encounter (Signed)
That is fine. I can do it. Side A or my office for signature

## 2018-02-06 NOTE — Telephone Encounter (Addendum)
Ok Rx is printed and will await signature.   Received Rx and called pt to let her know Rx is ready to be picked up. Pt understood and Rx placed up front. Nothing further is needed.

## 2018-02-08 ENCOUNTER — Ambulatory Visit (INDEPENDENT_AMBULATORY_CARE_PROVIDER_SITE_OTHER): Payer: Medicare Other | Admitting: Family Medicine

## 2018-02-08 ENCOUNTER — Encounter: Payer: Self-pay | Admitting: Family Medicine

## 2018-02-08 VITALS — BP 112/82 | HR 60 | Temp 98.6°F

## 2018-02-08 DIAGNOSIS — H6983 Other specified disorders of Eustachian tube, bilateral: Secondary | ICD-10-CM

## 2018-02-08 DIAGNOSIS — Q796 Ehlers-Danlos syndrome, unspecified: Secondary | ICD-10-CM

## 2018-02-08 DIAGNOSIS — J3089 Other allergic rhinitis: Secondary | ICD-10-CM | POA: Diagnosis not present

## 2018-02-08 DIAGNOSIS — H6993 Unspecified Eustachian tube disorder, bilateral: Secondary | ICD-10-CM

## 2018-02-08 MED ORDER — FLUTICASONE PROPIONATE 50 MCG/ACT NA SUSP
2.0000 | Freq: Every day | NASAL | 2 refills | Status: DC
Start: 2018-02-08 — End: 2018-05-10

## 2018-02-08 NOTE — Progress Notes (Signed)
Patient: Karen [name] Female    DOB: 28-Oct-1975   42 y.o.   MRN: [id number] Visit Date: 02/08/2018  Today's Provider: Vernie Murders, PA   Chief Complaint  Patient presents with  . Ear Pain   Subjective:    Otalgia   There is pain in both ears. This is a new problem. Episode onset: approximately 2 months. Episode frequency: intermittent  The problem has been unchanged. There has been no fever. Associated symptoms comments: Congestion and postnasal drainage . Treatments tried: OTC decongestant, Mucinex and Tylenol       Patient Active Problem List   Diagnosis Date Noted  . Excessive somnolence disorder 06/28/2017  . Seizures ([place]) 09/12/2016  . Sinusitis 09/12/2016  . Difficulty hearing 08/31/2015  . Back pain, chronic 08/31/2015  . Rheumatoid arthritis ([place]) 08/31/2015  . Abdominal pain 03/10/2015  . Alopecia 03/10/2015  . Anemia 03/10/2015  . Arthritis 03/10/2015  . Allergic asthma, mild persistent, uncomplicated 26/71/2458  . Chronic nausea 03/10/2015  . DDD (degenerative disc disease), lumbar 03/10/2015  . Depression 03/10/2015  . Personal history of MRSA (methicillin resistant Staphylococcus aureus) 03/10/2015  . Insomnia 03/10/2015  . Mitral valve prolapse 03/10/2015  . Paresthesias/numbness 03/10/2015  . Allergic rhinitis, seasonal 03/10/2015  . Seizure disorder ([place]) 03/10/2015  . Mixed sleep apnea, Central predominant 03/10/2015  . Migraine without aura 01/27/2014  . Fibromyalgia 01/27/2014  . Transient alteration of awareness 01/07/2014  . Tics of organic origin 01/07/2014  . Backache, unspecified 01/07/2014  . Ehlers-Danlos syndrome 01/07/2014   Past Surgical History:  Procedure Laterality Date  . DILATION AND CURETTAGE OF UTERUS  2009  . EYE SURGERY Left 1990   3 Surgeries on left eye to correct crossed eye  . LAPAROSCOPY Left 2003   Fallopian Tube  . MANDIBLE SURGERY  1998  . OTHER SURGICAL HISTORY Right 1987   3 Surgeries to repair broken  arm  . OTHER SURGICAL HISTORY Left    3 surgeries on her left thigh as an infant  . OTHER SURGICAL HISTORY  1998   Jaw surgery  . Right arm surgery  1987   x 3 due to fracture  . TONSILLECTOMY  12/13/2002   Family History  Problem Relation Age of Onset  . Depression Mother   . Stroke Mother   . Fibromyalgia Mother   . Osteoarthritis Mother   . Asthma Brother   . Depression Brother   . Migraines Brother   . Asperger's syndrome Brother   . Other Brother        Ehlers-Danlos Syndrome  . Cancer Maternal Aunt   . Asperger's syndrome Maternal Aunt   . Heart disease Paternal Aunt   . Heart disease Paternal Uncle   . Cervical cancer Maternal Grandmother   . Osteoporosis Maternal Grandmother        Died at 28  . Osteoarthritis Maternal Grandmother   . Colon cancer Maternal Grandfather   . Asthma Maternal Grandfather   . Asperger's syndrome Maternal Grandfather   . Emphysema Maternal Grandfather        Died at 37  . Prostate cancer Maternal Grandfather   . Heart attack Paternal Grandfather        Died at 69  . Asthma Paternal Grandfather   . Tuberculosis Paternal Grandfather   . Cancer Cousin   . Asperger's syndrome Cousin   . Asperger's syndrome Other   . Heart Problems Paternal Grandmother        Died at 16  .  Ehlers-Danlos syndrome Father   . Ehlers-Danlos syndrome Brother    Allergies  Allergen Reactions  . Chocolate Flavor     Other reaction(s): Other (See Comments) Wheezing, acne  . Demerol [Meperidine] Other (See Comments)    Slow to wake up when this drug is given.   Marland Kitchen Keflex [Cephalexin] Nausea And Vomiting  . Morphine And Related Nausea And Vomiting  . Toradol [Ketorolac Tromethamine] Nausea And Vomiting  . Augmentin  [Amoxicillin-Pot Clavulanate]   . Chocolate     GI distress  . Nsaids     patient develops ulcers  . Strawberry Extract     GI distress  . Tape Dermatitis    Current Outpatient Medications:  .  ADVAIR DISKUS 250-50 MCG/DOSE AEPB,  INHALE 1 PUFF TWICE A DAY, Disp: 60 each, Rfl: 6 .  albuterol (VENTOLIN HFA) 108 (90 Base) MCG/ACT inhaler, Inhale 2 puffs into the lungs every 6 (six) hours as needed., Disp: 18 Inhaler, Rfl: 5 .  amphetamine-dextroamphetamine (ADDERALL XR) 15 MG 24 hr capsule, Take 1 capsule by mouth every morning., Disp: 20 capsule, Rfl: 0 .  budesonide-formoterol (SYMBICORT) 160-4.5 MCG/ACT inhaler, Inhale 2 puffs into the lungs 2 (two) times daily., Disp: 2 Inhaler, Rfl: 0 .  busPIRone (BUSPAR) 10 MG tablet, Take 1 tablet (10 mg total) by mouth 2 (two) times daily., Disp: 60 tablet, Rfl: 5 .  celecoxib (CELEBREX) 200 MG capsule, TAKE 1 CAPSULE BY MOUTH 2 TIMES A DAY, Disp: 60 capsule, Rfl: 11 .  DEXILANT 30 MG capsule, TAKE ONE CAPSULE BY MOUTH DAILY, Disp: 30 capsule, Rfl: 10 .  diclofenac sodium (VOLTAREN) 1 % GEL, APPLY AS NEEDED 3 TIMES A DAY, Disp: 100 g, Rfl: 5 .  DULoxetine (CYMBALTA) 60 MG capsule, TAKE 2 CAPSULES (120 MG TOTAL) BY MOUTH DAILY., Disp: 60 capsule, Rfl: 11 .  fentaNYL (DURAGESIC - DOSED MCG/HR) 12 MCG/HR, Place 1 patch (12.5 mcg total) onto the skin every 3 (three) days., Disp: 10 patch, Rfl: 0 .  fentaNYL (DURAGESIC - DOSED MCG/HR) 75 MCG/HR, Place 1 patch (75 mcg total) onto the skin every 3 (three) days., Disp: 10 patch, Rfl: 0 .  fexofenadine (ALLEGRA) 180 MG tablet, Take 180 mg by mouth daily., Disp: , Rfl:  .  fluticasone (FLONASE) 50 MCG/ACT nasal spray, Place 2 sprays into both nostrils daily., Disp: , Rfl:  .  gabapentin (NEURONTIN) 600 MG tablet, TAKE 2 TABLETS BY MOUTH EVERY MORNING AND 3 TABLETS AT BEDTIME., Disp: 155 tablet, Rfl: 5 .  haloperidol (HALDOL) 0.5 MG tablet, TAKE 0.5 TABLETS (0.25 MG TOTAL) 2 (TWO) TIMES DAILY BY MOUTH., Disp: 31 tablet, Rfl: 1 .  loratadine (CLARITIN) 10 MG tablet, Take 10 mg by mouth daily., Disp: , Rfl:  .  LYRICA 300 MG capsule, TAKE 1 CAPSULE BY MOUTH TWICE DAILY, Disp: 180 capsule, Rfl: 5 .  norethindrone (CAMILA) 0.35 MG tablet, Take 1  tablet (0.35 mg total) by mouth daily., Disp: 28 tablet, Rfl: 9 .  ondansetron (ZOFRAN-ODT) 4 MG disintegrating tablet, DISSOLVE 1 TABLET IN MOUTH EVERY 8 HOURS FOR NAUSEA AND VOMITING, Disp: 30 tablet, Rfl: 5 .  QUEtiapine (SEROQUEL) 200 MG tablet, TAKE 1 TABLET (200 MG TOTAL) BY MOUTH AT BEDTIME., Disp: 30 tablet, Rfl: 11 .  sucralfate (CARAFATE) 1 g tablet, TAKE 1 TABLET (1 G TOTAL) BY MOUTH 4 (FOUR) TIMES DAILY., Disp: 60 tablet, Rfl: 5 .  tiZANidine (ZANAFLEX) 4 MG tablet, TAKE 1/2 TO 1 TABLET BY MOUTH EVERY EIGHT HOURS, Disp: 90 tablet,  Rfl: 4  Review of Systems  Constitutional: Negative.   HENT: Positive for congestion, ear pain and postnasal drip.   Respiratory: Negative.   Cardiovascular: Negative.    Social History   Tobacco Use  . Smoking status: Never Smoker  . Smokeless tobacco: Never Used  Substance Use Topics  . Alcohol use: No    Alcohol/week: 0.0 oz   Objective:   BP 112/82 (BP Location: Right Arm, Patient Position: Sitting, Cuff Size: Normal)   Pulse 60   Temp 98.6 F (37 C) (Oral)   SpO2 97%   Physical Exam  Constitutional: She is oriented to person, place, and time. She appears well-developed and well-nourished. No distress.  HENT:  Head: Normocephalic and atraumatic.  Right Ear: Hearing and external ear normal.  Left Ear: Hearing and external ear normal.  Nose: Nose normal.  Slightly irritation to posterior pharynx with bilateral dull reflex to TM's. No fluid lines noted. No local lymphadenopathy.  Eyes: Conjunctivae and lids are normal. Right eye exhibits no discharge. Left eye exhibits no discharge. No scleral icterus.  Cardiovascular: Normal rate.  Pulmonary/Chest: Effort normal and breath sounds normal. No respiratory distress.  Musculoskeletal: Normal range of motion.  Neurological: She is alert and oriented to person, place, and time.  Skin: Skin is intact. No lesion and no rash noted.  Psychiatric: She has a normal mood and affect. Her speech is  normal and behavior is normal. Thought content normal.      Assessment & Plan:     1. Dysfunction of both eustachian tubes Onset of ear discomfort bilaterally the past month or two with allergic rhinitis. No fever, cough, wheeze or hearing loss. Will treat with Flonase Nasal Spray 2 sprays at bedtime to both nostrils. Recheck if no better in 7-10 days. - fluticasone (FLONASE) 50 MCG/ACT nasal spray; Place 2 sprays into both nostrils daily.  Dispense: 16 g; Refill: 2  2. Seasonal allergic rhinitis due to other allergic trigger Some seasonal rhinorrhea, sneezing and PND. No red sore throat. Ears aching and feeling stopped up. Will treat with Flonase regularly and may add Claritin prn. Recheck prn. - fluticasone (FLONASE) 50 MCG/ACT nasal spray; Place 2 sprays into both nostrils daily.  Dispense: 16 g; Refill: 2  3. Ehlers-Danlos syndrome Followed by Dr. Gaynell Face (neurologist) with severe mixed sleep apnea, fibromyalgia and rheumatoid arthritis. Wheelchair bound and chronic pain syndrome.       Vernie Murders, PA  [facility]

## 2018-02-13 ENCOUNTER — Encounter: Payer: Self-pay | Admitting: Gastroenterology

## 2018-02-13 ENCOUNTER — Ambulatory Visit: Payer: Medicare Other | Admitting: Anesthesiology

## 2018-02-13 ENCOUNTER — Encounter
Admission: RE | Disposition: A | Discharge status: Home / Self Care | Payer: Self-pay | Source: Ambulatory Visit | Attending: Gastroenterology

## 2018-02-13 ENCOUNTER — Ambulatory Visit
Admission: RE | Admit: 2018-02-13 | Discharge: 2018-02-13 | Disposition: A | Discharge status: Home / Self Care | Payer: Medicare Other | Source: Ambulatory Visit | Attending: Gastroenterology | Admitting: Gastroenterology

## 2018-02-13 DIAGNOSIS — J302 Other seasonal allergic rhinitis: Secondary | ICD-10-CM | POA: Diagnosis not present

## 2018-02-13 DIAGNOSIS — Z79899 Other long term (current) drug therapy: Secondary | ICD-10-CM | POA: Diagnosis not present

## 2018-02-13 DIAGNOSIS — R12 Heartburn: Secondary | ICD-10-CM | POA: Diagnosis not present

## 2018-02-13 DIAGNOSIS — Z7951 Long term (current) use of inhaled steroids: Secondary | ICD-10-CM | POA: Diagnosis not present

## 2018-02-13 DIAGNOSIS — D128 Benign neoplasm of rectum: Secondary | ICD-10-CM | POA: Insufficient documentation

## 2018-02-13 DIAGNOSIS — Q796 Ehlers-Danlos syndrome: Secondary | ICD-10-CM | POA: Diagnosis not present

## 2018-02-13 DIAGNOSIS — K621 Rectal polyp: Secondary | ICD-10-CM | POA: Diagnosis not present

## 2018-02-13 DIAGNOSIS — D122 Benign neoplasm of ascending colon: Secondary | ICD-10-CM | POA: Diagnosis not present

## 2018-02-13 DIAGNOSIS — Z86010 Personal history of colonic polyps: Secondary | ICD-10-CM

## 2018-02-13 DIAGNOSIS — G4730 Sleep apnea, unspecified: Secondary | ICD-10-CM | POA: Diagnosis not present

## 2018-02-13 DIAGNOSIS — J45909 Unspecified asthma, uncomplicated: Secondary | ICD-10-CM | POA: Insufficient documentation

## 2018-02-13 DIAGNOSIS — G40909 Epilepsy, unspecified, not intractable, without status epilepticus: Secondary | ICD-10-CM | POA: Diagnosis not present

## 2018-02-13 DIAGNOSIS — D125 Benign neoplasm of sigmoid colon: Secondary | ICD-10-CM | POA: Diagnosis not present

## 2018-02-13 DIAGNOSIS — R1013 Epigastric pain: Secondary | ICD-10-CM

## 2018-02-13 DIAGNOSIS — D123 Benign neoplasm of transverse colon: Secondary | ICD-10-CM

## 2018-02-13 DIAGNOSIS — K635 Polyp of colon: Secondary | ICD-10-CM | POA: Diagnosis not present

## 2018-02-13 DIAGNOSIS — J4530 Mild persistent asthma, uncomplicated: Secondary | ICD-10-CM | POA: Diagnosis not present

## 2018-02-13 DIAGNOSIS — K599 Functional intestinal disorder, unspecified: Secondary | ICD-10-CM | POA: Diagnosis not present

## 2018-02-13 DIAGNOSIS — Z800 Family history of malignant neoplasm of digestive organs: Secondary | ICD-10-CM | POA: Diagnosis not present

## 2018-02-13 DIAGNOSIS — D126 Benign neoplasm of colon, unspecified: Secondary | ICD-10-CM | POA: Diagnosis not present

## 2018-02-13 HISTORY — PX: ESOPHAGOGASTRODUODENOSCOPY (EGD) WITH PROPOFOL: SHX5813

## 2018-02-13 HISTORY — PX: COLONOSCOPY WITH PROPOFOL: SHX5780

## 2018-02-13 LAB — POCT PREGNANCY, URINE: Preg Test, Ur: NEGATIVE

## 2018-02-13 SURGERY — COLONOSCOPY WITH PROPOFOL
Anesthesia: General

## 2018-02-13 MED ORDER — PROPOFOL 500 MG/50ML IV EMUL
INTRAVENOUS | Status: AC
  Filled 2018-02-13: qty 50

## 2018-02-13 MED ORDER — PROPOFOL 10 MG/ML IV BOLUS
INTRAVENOUS | Status: DC | PRN
  Administered 2018-02-13: 50 mg via INTRAVENOUS

## 2018-02-13 MED ORDER — PROPOFOL 500 MG/50ML IV EMUL
INTRAVENOUS | Status: DC | PRN
  Administered 2018-02-13: 75 ug/kg/min via INTRAVENOUS

## 2018-02-13 MED ORDER — SODIUM CHLORIDE 0.9 % IV SOLN
INTRAVENOUS | Status: DC
  Administered 2018-02-13: 1000 mL via INTRAVENOUS

## 2018-02-13 NOTE — Op Note (Signed)
[facility] Gastroenterology Patient Name: Karen [name] Procedure Date: 02/13/2018 8:45 AM MRN: [id number] Account #: [id number] Date of Birth: 1976/05/11 Admit Type: Outpatient Age: 42 Room: [facility] ENDO ROOM 4 Gender: Female Note Status: Finalized Procedure:            Colonoscopy Indications:          Family history of anal canal cancer Providers:            [name] MD, MD Referring MD:         Kirstie Peri. Caryn Section, MD (Referring MD) Medicines:            Propofol per Anesthesia Complications:        No immediate complications. Procedure:            Pre-Anesthesia Assessment:                       - Prior to the procedure, a History and Physical was                        performed, and patient medications and allergies were                        reviewed. The patient's tolerance of previous                        anesthesia was also reviewed. The risks and benefits of                        the procedure and the sedation options and risks were                        discussed with the patient. All questions were                        answered, and informed consent was obtained. Prior                        Anticoagulants: The patient has taken no previous                        anticoagulant or antiplatelet agents. ASA Grade                        Assessment: II - A patient with mild systemic disease.                        After reviewing the risks and benefits, the patient was                        deemed in satisfactory condition to undergo the                        procedure.                       After obtaining informed consent, the colonoscope was                        passed under direct vision. Throughout the procedure,  the patient's blood pressure, pulse, and oxygen                        saturations were monitored continuously. The                        Colonoscope was introduced through the anus and     advanced to the the cecum, identified by appendiceal                        orifice and ileocecal valve. The colonoscopy was                        performed without difficulty. The patient tolerated the                        procedure well. The quality of the bowel preparation                        was excellent. Findings:      The perianal and digital rectal examinations were normal.      Three sessile polyps were found in the ascending colon. The polyps were       5 to 7 mm in size. These polyps were removed with a cold snare.       Resection and retrieval were complete.      Two sessile polyps were found in the ascending colon. The polyps were 3       to 4 mm in size. These polyps were removed with a cold biopsy forceps.       Resection and retrieval were complete.      A 7 mm polyp was found in the transverse colon. The polyp was sessile.       The polyp was removed with a cold snare. Resection and retrieval were       complete.      A 5 mm polyp was found in the sigmoid colon. The polyp was pedunculated.       The polyp was removed with a hot snare. Resection and retrieval were       complete.      A 4 mm polyp was found in the rectum. The polyp was sessile. The polyp       was removed with a cold snare. Resection and retrieval were complete.      A continuous area of nonbleeding ulcerated mucosa with no stigmata of       recent bleeding was present in the rectum. Biopsies were taken with a       cold forceps for histology. Impression:           - Three 5 to 7 mm polyps in the ascending colon,                        removed with a cold snare. Resected and retrieved.                       - Two 3 to 4 mm polyps in the ascending colon, removed                        with a cold biopsy forceps. Resected and retrieved.                       -  One 7 mm polyp in the transverse colon, removed with                        a cold snare. Resected and retrieved.                       -  One 5 mm polyp in the sigmoid colon, removed with a                        hot snare. Resected and retrieved.                       - One 4 mm polyp in the rectum, removed with a cold                        snare. Resected and retrieved.                       - Mucosal ulceration. Biopsied. Recommendation:       - Discharge patient to home.                       - Resume previous diet.                       - Continue present medications.                       - Await pathology results.                       - Repeat colonoscopy in 3 years for surveillance. Procedure Code(s):    --- Professional ---                       [phone], Colonoscopy, flexible; with removal of tumor(s),                        polyp(s), or other lesion(s) by snare technique                       45380, 74, Colonoscopy, flexible; with biopsy, single                        or multiple Diagnosis Code(s):    --- Professional ---                       Z80.0, Family history of malignant neoplasm of                        digestive organs                       D12.3, Benign neoplasm of transverse colon (hepatic                        flexure or splenic flexure)                       D12.5, Benign neoplasm of sigmoid colon                       K62.1, Rectal  polyp                       D12.2, Benign neoplasm of ascending colon CPT copyright 2017 American Medical Association. All rights reserved. The codes documented in this report are preliminary and upon coder review may  be revised to meet current compliance requirements. [name] MD, MD 02/13/2018 9:44:02 AM This report has been signed electronically. Number of Addenda: 0 Note Initiated On: 02/13/2018 8:45 AM Scope Withdrawal Time: 0 hours 17 minutes 29 seconds  Total Procedure Duration: 0 hours 21 minutes 52 seconds       [facility]

## 2018-02-13 NOTE — H&P (Signed)
Karen Lame, MD [address] [place], [place] [address] [phone] Fax : [phone]  Primary Care Physician:  Birdie Sons, MD Primary Gastroenterologist:  Dr. Allen Norris  Pre-Procedure History & Physical: HPI:  Karen [name] is a 42 y.o. female is here for an endoscopy and colonoscopy.   Past Medical History:  Diagnosis Date  . Ehlers-Danlos syndrome   . Headache   . Sleep apnea   . Tics of organic origin   . Transient alteration of awareness     Past Surgical History:  Procedure Laterality Date  . DILATION AND CURETTAGE OF UTERUS  2009  . EYE SURGERY Left 1990   3 Surgeries on left eye to correct crossed eye  . LAPAROSCOPY Left 2003   Fallopian Tube  . MANDIBLE SURGERY  1998  . OTHER SURGICAL HISTORY Right 1987   3 Surgeries to repair broken arm  . OTHER SURGICAL HISTORY Left    3 surgeries on her left thigh as an infant  . OTHER SURGICAL HISTORY  1998   Jaw surgery  . Right arm surgery  1987   x 3 due to fracture  . TONSILLECTOMY  12/13/2002    Prior to Admission medications   Medication Sig Start Date End Date Taking? Authorizing Provider  ADVAIR DISKUS 250-50 MCG/DOSE AEPB INHALE 1 PUFF TWICE A DAY 08/07/15  Yes Birdie Sons, MD  albuterol (VENTOLIN HFA) 108 (90 Base) MCG/ACT inhaler Inhale 2 puffs into the lungs every 6 (six) hours as needed. 12/12/17  Yes Birdie Sons, MD  amphetamine-dextroamphetamine (ADDERALL XR) 15 MG 24 hr capsule Take 1 capsule by mouth every morning. 02/06/18  Yes Brand Males, MD  budesonide-formoterol (SYMBICORT) 160-4.5 MCG/ACT inhaler Inhale 2 puffs into the lungs 2 (two) times daily. 12/18/17  Yes Young, Tarri Fuller D, MD  busPIRone (BUSPAR) 10 MG tablet Take 1 tablet (10 mg total) by mouth 2 (two) times daily. 01/19/18  Yes Birdie Sons, MD  celecoxib (CELEBREX) 200 MG capsule TAKE 1 CAPSULE BY MOUTH 2 TIMES A DAY 09/19/17  Yes Birdie Sons, MD  DEXILANT 30 MG capsule TAKE ONE CAPSULE BY MOUTH  DAILY 04/27/17  Yes Birdie Sons, MD  diclofenac sodium (VOLTAREN) 1 % GEL APPLY AS NEEDED 3 TIMES A DAY 11/26/17  Yes Birdie Sons, MD  DULoxetine (CYMBALTA) 60 MG capsule TAKE 2 CAPSULES (120 MG TOTAL) BY MOUTH DAILY. 01/27/18  Yes Birdie Sons, MD  fentaNYL (DURAGESIC - DOSED MCG/HR) 12 MCG/HR Place 1 patch (12.5 mcg total) onto the skin every 3 (three) days. 01/22/18  Yes Birdie Sons, MD  fentaNYL (DURAGESIC - DOSED MCG/HR) 75 MCG/HR Place 1 patch (75 mcg total) onto the skin every 3 (three) days. 01/22/18  Yes Birdie Sons, MD  fexofenadine (ALLEGRA) 180 MG tablet Take 180 mg by mouth daily.   Yes [provider]  fluticasone (FLONASE) 50 MCG/ACT nasal spray Place 2 sprays into both nostrils daily. 02/08/18  Yes Chrismon, Vickki Muff, PA  gabapentin (NEURONTIN) 600 MG tablet TAKE 2 TABLETS BY MOUTH EVERY MORNING AND 3 TABLETS AT BEDTIME. 01/04/18  Yes Hickling, Princess Bruins, MD  haloperidol (HALDOL) 0.5 MG tablet TAKE 0.5 TABLETS (0.25 MG TOTAL) 2 (TWO) TIMES DAILY BY MOUTH. 01/11/18  Yes Jodi Geralds, MD  loratadine (CLARITIN) 10 MG tablet Take 10 mg by mouth daily.   Yes [provider]  LYRICA 300 MG capsule TAKE 1 CAPSULE BY MOUTH TWICE DAILY 08/24/17  Yes  Birdie Sons, MD  norethindrone (CAMILA) 0.35 MG tablet Take 1 tablet (0.35 mg total) by mouth daily. 01/26/18  Yes Birdie Sons, MD  ondansetron (ZOFRAN-ODT) 4 MG disintegrating tablet DISSOLVE 1 TABLET IN MOUTH EVERY 8 HOURS FOR NAUSEA AND VOMITING 02/16/17  Yes Birdie Sons, MD  QUEtiapine (SEROQUEL) 200 MG tablet TAKE 1 TABLET (200 MG TOTAL) BY MOUTH AT BEDTIME. 09/01/17  Yes Birdie Sons, MD  sucralfate (CARAFATE) 1 g tablet TAKE 1 TABLET (1 G TOTAL) BY MOUTH 4 (FOUR) TIMES DAILY. 01/04/18  Yes Birdie Sons, MD  tiZANidine (ZANAFLEX) 4 MG tablet TAKE 1/2 TO 1 TABLET BY MOUTH EVERY EIGHT HOURS 09/20/17  Yes Birdie Sons, MD    Allergies as of 01/23/2018 - Review Complete 01/23/2018    Allergen Reaction Noted  . Chocolate flavor  03/18/2014  . Demerol [meperidine] Other (See Comments) 01/27/2014  . Keflex [cephalexin] Nausea And Vomiting 01/27/2014  . Morphine and related Nausea And Vomiting 01/27/2014  . Toradol [ketorolac tromethamine] Nausea And Vomiting 01/27/2014  . Augmentin  [amoxicillin-pot clavulanate]  03/10/2015  . Chocolate  03/10/2015  . Nsaids  03/10/2015  . Strawberry extract  03/10/2015  . Tape Dermatitis 07/29/2015    Family History  Problem Relation Age of Onset  . Depression Mother   . Stroke Mother   . Fibromyalgia Mother   . Osteoarthritis Mother   . Asthma Brother   . Depression Brother   . Migraines Brother   . Asperger's syndrome Brother   . Other Brother        Ehlers-Danlos Syndrome  . Cancer Maternal Aunt   . Asperger's syndrome Maternal Aunt   . Heart disease Paternal Aunt   . Heart disease Paternal Uncle   . Cervical cancer Maternal Grandmother   . Osteoporosis Maternal Grandmother        Died at 80  . Osteoarthritis Maternal Grandmother   . Colon cancer Maternal Grandfather   . Asthma Maternal Grandfather   . Asperger's syndrome Maternal Grandfather   . Emphysema Maternal Grandfather        Died at 93  . Prostate cancer Maternal Grandfather   . Heart attack Paternal Grandfather        Died at 44  . Asthma Paternal Grandfather   . Tuberculosis Paternal Grandfather   . Cancer Cousin   . Asperger's syndrome Cousin   . Asperger's syndrome Other   . Heart Problems Paternal Grandmother        Died at 32  . Ehlers-Danlos syndrome Father   . Ehlers-Danlos syndrome Brother     Social History   Socioeconomic History  . Marital status: Single    Spouse name: Not on file  . Number of children: Not on file  . Years of education: Not on file  . Highest education level: Not on file  Occupational History  . Not on file  Social Needs  . Financial resource strain: Not on file  . Food insecurity:    Worry: Not on file     Inability: Not on file  . Transportation needs:    Medical: Not on file    Non-medical: Not on file  Tobacco Use  . Smoking status: Never Smoker  . Smokeless tobacco: Never Used  Substance and Sexual Activity  . Alcohol use: No    Alcohol/week: 0.0 oz  . Drug use: No  . Sexual activity: Never  Lifestyle  . Physical activity:    Days per week: Not  on file    Minutes per session: Not on file  . Stress: Not on file  Relationships  . Social connections:    Talks on phone: Not on file    Gets together: Not on file    Attends religious service: Not on file    Active member of club or organization: Not on file    Attends meetings of clubs or organizations: Not on file    Relationship status: Not on file  . Intimate partner violence:    Fear of current or ex partner: Not on file    Emotionally abused: Not on file    Physically abused: Not on file    Forced sexual activity: Not on file  Other Topics Concern  . 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    She lives with her mother.    She enjoys reading, watching TV, writing, and painting.     Review of Systems: See HPI, otherwise negative ROS  Physical Exam: BP (!) 132/92   Pulse 92   Temp 97.7 F (36.5 C) (Tympanic)   Resp 16   Ht 4' 11.5" (1.511 m)   Wt 149 lb (67.6 kg)   SpO2 98%   BMI 29.59 kg/m  General:   Alert,  pleasant and cooperative in NAD Head:  Normocephalic and atraumatic. Neck:  Supple; no masses or thyromegaly. Lungs:  Clear throughout to auscultation.    Heart:  Regular rate and rhythm. Abdomen:  Soft, nontender and nondistended. Normal bowel sounds, without guarding, and without rebound.   Neurologic:  Alert and  oriented x4;  grossly normal neurologically.  Impression/Plan: ALIANNA WURSTER is here for an endoscopy and colonoscopy to be performed for Epigastric pain GERD and Family history of colon cancer.  Risks, benefits, limitations, and alternatives  regarding  endoscopy and colonoscopy have been reviewed with the patient.  Questions have been answered.  All parties agreeable.   Karen Lame, MD  02/13/2018, 9:47 AM

## 2018-02-13 NOTE — Anesthesia Postprocedure Evaluation (Signed)
Anesthesia Post Note  Patient: Karen [name]  Procedure(s) Performed: COLONOSCOPY WITH PROPOFOL (N/A ) ESOPHAGOGASTRODUODENOSCOPY (EGD) WITH PROPOFOL  Patient location during evaluation: PACU Anesthesia Type: General Level of consciousness: awake and alert Pain management: pain level controlled Vital Signs Assessment: post-procedure vital signs reviewed and stable Respiratory status: spontaneous breathing, nonlabored ventilation, respiratory function stable and patient connected to nasal cannula oxygen Cardiovascular status: blood pressure returned to baseline and stable Postop Assessment: no apparent nausea or vomiting Anesthetic complications: no     Last Vitals:  Vitals:   02/13/18 0946 02/13/18 0956  BP: 124/82 125/90  Pulse: 79 76  Resp: 15 20  Temp: (!) 36.1 C   SpO2: 96% 99%    Last Pain:  Vitals:   02/13/18 0956  TempSrc:   PainSc: 0-No pain                 [name]

## 2018-02-13 NOTE — Op Note (Signed)
[facility] Gastroenterology Patient Name: Karen [name] Procedure Date: 02/13/2018 9:07 AM MRN: [id number] Account #: [id number] Date of Birth: August 04, 1976 Admit Type: Outpatient Age: 42 Room: [facility] ENDO ROOM 4 Gender: Female Note Status: Finalized Procedure:            Upper GI endoscopy Indications:          Epigastric abdominal pain, Heartburn Providers:            [name] MD, MD Referring MD:         Lelon Huh MD Medicines:            Propofol per Anesthesia Complications:        No immediate complications. Procedure:            Pre-Anesthesia Assessment:                       - Prior to the procedure, a History and Physical was                        performed, and patient medications and allergies were                        reviewed. The patient's tolerance of previous                        anesthesia was also reviewed. The risks and benefits of                        the procedure and the sedation options and risks were                        discussed with the patient. All questions were                        answered, and informed consent was obtained. Prior                        Anticoagulants: The patient has taken no previous                        anticoagulant or antiplatelet agents. ASA Grade                        Assessment: II - A patient with mild systemic disease.                        After reviewing the risks and benefits, the patient was                        deemed in satisfactory condition to undergo the                        procedure.                       After obtaining informed consent, the endoscope was                        passed under direct vision. Throughout the procedure,  the patient's blood pressure, pulse, and oxygen                        saturations were monitored continuously. The Endoscope                        was introduced through the mouth, and advanced to the        second part of duodenum. The upper GI endoscopy was                        accomplished without difficulty. The patient tolerated                        the procedure well. Findings:      The examined esophagus was normal.      The stomach was normal.      The examined duodenum was normal. Impression:           - Normal esophagus.                       - Normal stomach.                       - Normal examined duodenum.                       - No specimens collected. Recommendation:       - Discharge patient to home.                       - Resume previous diet.                       - Continue present medications.                       - Perform a colonoscopy today. Procedure Code(s):    --- Professional ---                       [id number], Esophagogastroduodenoscopy, flexible, transoral;                        diagnostic, including collection of specimen(s) by                        brushing or washing, when performed (separate procedure) Diagnosis Code(s):    --- Professional ---                       R12, Heartburn                       R10.13, Epigastric pain CPT copyright 2017 American Medical Association. All rights reserved. The codes documented in this report are preliminary and upon coder review may  be revised to meet current compliance requirements. [name] MD, MD 02/13/2018 9:16:54 AM This report has been signed electronically. Number of Addenda: 0 Note Initiated On: 02/13/2018 9:07 AM      [facility]

## 2018-02-13 NOTE — Anesthesia Preprocedure Evaluation (Signed)
Anesthesia Evaluation  Patient identified by MRN, date of birth, ID band Patient awake    Reviewed: Allergy & Precautions, H&P , NPO status , Patient's Chart, lab work & pertinent test results, reviewed documented beta blocker date and time   Airway Mallampati: II   Neck ROM: full    Dental  (+) Poor Dentition, Teeth Intact   Pulmonary neg pulmonary ROS, asthma , sleep apnea and Continuous Positive Airway Pressure Ventilation ,    Pulmonary exam normal        Cardiovascular Exercise Tolerance: Poor negative cardio ROS Normal cardiovascular exam+ Valvular Problems/Murmurs  Rhythm:regular Rate:Normal     Neuro/Psych  Headaches, Seizures -,   Neuromuscular disease negative neurological ROS  negative psych ROS   GI/Hepatic negative GI ROS, Neg liver ROS,   Endo/Other  negative endocrine ROS  Renal/GU negative Renal ROS  negative genitourinary   Musculoskeletal   Abdominal   Peds  Hematology negative hematology ROS (+) anemia ,   Anesthesia Other Findings Past Medical History: No date: Ehlers-Danlos syndrome No date: Headache No date: Sleep apnea No date: Tics of organic origin No date: Transient alteration of awareness Past Surgical History: 2009: [place]: EYE SURGERY; Left     Comment:  3 Surgeries on left eye to correct crossed eye 2003: LAPAROSCOPY; Left     Comment:  Fallopian Tube 1998: MANDIBLE SURGERY 1987: OTHER SURGICAL HISTORY; Right     Comment:  3 Surgeries to repair broken arm No date: OTHER SURGICAL HISTORY; Left     Comment:  3 surgeries on her left thigh as an infant 1998: OTHER SURGICAL HISTORY     Comment:  Jaw surgery 1987: Right arm surgery     Comment:  x 3 due to fracture 12/13/2002: TONSILLECTOMY BMI    Body Mass Index:  29.59 kg/m     Reproductive/Obstetrics negative OB ROS                             Anesthesia  Physical Anesthesia Plan  ASA: III  Anesthesia Plan: General   Post-op Pain Management:    Induction:   PONV Risk Score and Plan:   Airway Management Planned:   Additional Equipment:   Intra-op Plan:   Post-operative Plan:   Informed Consent: I have reviewed the patients History and Physical, chart, labs and discussed the procedure including the risks, benefits and alternatives for the proposed anesthesia with the patient or authorized representative who has indicated his/her understanding and acceptance.   Dental Advisory Given  Plan Discussed with: CRNA  Anesthesia Plan Comments:         Anesthesia Quick Evaluation

## 2018-02-13 NOTE — Transfer of Care (Signed)
Immediate Anesthesia Transfer of Care Note  Patient: Karen [name]  Procedure(s) Performed: COLONOSCOPY WITH PROPOFOL (N/A ) ESOPHAGOGASTRODUODENOSCOPY (EGD) WITH PROPOFOL  Patient Location: PACU and Endoscopy Unit  Anesthesia Type:General  Level of Consciousness: awake, alert  and oriented  Airway & Oxygen Therapy: Patient Spontanous Breathing and Patient connected to nasal cannula oxygen  Post-op Assessment: Report given to RN and Post -op Vital signs reviewed and stable  Post vital signs: Reviewed and stable  Last Vitals:  Vitals Value Taken Time  BP    Temp 36.1 C 02/13/2018  9:46 AM  Pulse 79 02/13/2018  9:47 AM  Resp 14 02/13/2018  9:47 AM  SpO2 98 % 02/13/2018  9:47 AM  Vitals shown include unvalidated device data.  Last Pain:  Vitals:   02/13/18 0946  TempSrc: Tympanic  PainSc:          Complications: No apparent anesthesia complications

## 2018-02-13 NOTE — Anesthesia Post-op Follow-up Note (Signed)
Anesthesia QCDR form completed.        

## 2018-02-16 LAB — SURGICAL PATHOLOGY

## 2018-02-17 ENCOUNTER — Other Ambulatory Visit: Payer: Self-pay | Admitting: Family Medicine

## 2018-02-19 ENCOUNTER — Encounter: Payer: Self-pay | Admitting: Gastroenterology

## 2018-02-21 ENCOUNTER — Other Ambulatory Visit: Payer: Self-pay

## 2018-02-22 NOTE — Telephone Encounter (Signed)
Pt's mom [name] called to see if Rx for both Fentanyl patches had been sent to [facility] was advised that they hadn't been sent and refill request can take 24 to 48 hours. [name] stated that someone from our office needed to come to hold pt's hand because she needs those patches. Please advise. Thanks TNP

## 2018-02-23 MED ORDER — FENTANYL 12 MCG/HR TD PT72: 12.5000 ug | MEDICATED_PATCH | TRANSDERMAL | 0 refills | Status: DC

## 2018-02-23 MED ORDER — FENTANYL 75 MCG/HR TD PT72: 75.0000 ug | MEDICATED_PATCH | TRANSDERMAL | 0 refills | Status: DC

## 2018-02-27 ENCOUNTER — Other Ambulatory Visit: Payer: Self-pay | Admitting: Family Medicine

## 2018-03-05 ENCOUNTER — Telehealth: Payer: Self-pay

## 2018-03-05 ENCOUNTER — Other Ambulatory Visit: Payer: Self-pay | Admitting: Family Medicine

## 2018-03-05 DIAGNOSIS — H9209 Otalgia, unspecified ear: Secondary | ICD-10-CM

## 2018-03-05 NOTE — Telephone Encounter (Signed)
Patient is requesting a referral to ENT- Dr. Tami Ribas for chronic recurring ear infections. She states she would like this done ASAP because the pain is gradually worsening. Patient was seen by Simona Huh on 02/08/18. CB# [phone]

## 2018-03-05 NOTE — Telephone Encounter (Signed)
Please advise 

## 2018-03-13 ENCOUNTER — Other Ambulatory Visit
Admission: RE | Admit: 2018-03-13 | Discharge: 2018-03-13 | Disposition: A | Discharge status: Home / Self Care | Payer: Medicare Other | Source: Ambulatory Visit | Attending: Gastroenterology | Admitting: Gastroenterology

## 2018-03-13 ENCOUNTER — Other Ambulatory Visit: Payer: Self-pay

## 2018-03-13 ENCOUNTER — Ambulatory Visit (INDEPENDENT_AMBULATORY_CARE_PROVIDER_SITE_OTHER): Payer: BC Managed Care – PPO | Admitting: Gastroenterology

## 2018-03-13 ENCOUNTER — Encounter: Payer: Self-pay | Admitting: Gastroenterology

## 2018-03-13 VITALS — BP 111/72 | HR 123 | Ht 59.5 in

## 2018-03-13 DIAGNOSIS — H9209 Otalgia, unspecified ear: Secondary | ICD-10-CM | POA: Diagnosis not present

## 2018-03-13 DIAGNOSIS — K921 Melena: Secondary | ICD-10-CM | POA: Diagnosis not present

## 2018-03-13 DIAGNOSIS — K625 Hemorrhage of anus and rectum: Secondary | ICD-10-CM | POA: Insufficient documentation

## 2018-03-13 DIAGNOSIS — H60339 Swimmer's ear, unspecified ear: Secondary | ICD-10-CM | POA: Diagnosis not present

## 2018-03-13 LAB — CBC
HCT: 40.1 % (ref 35.0–47.0)
Hemoglobin: 13.3 g/dL (ref 12.0–16.0)
MCH: 27.3 pg (ref 26.0–34.0)
MCHC: 33.3 g/dL (ref 32.0–36.0)
MCV: 82.1 fL (ref 80.0–100.0)
Platelets: 231 10*3/uL (ref 150–440)
RBC: 4.89 MIL/uL (ref 3.80–5.20)
RDW: 14.6 % — ABNORMAL HIGH (ref 11.5–14.5)
WBC: 8.8 10*3/uL (ref 3.6–11.0)

## 2018-03-13 NOTE — Progress Notes (Signed)
cbc

## 2018-03-13 NOTE — Progress Notes (Signed)
Primary Care Physician: Birdie Sons, MD  Primary Gastroenterologist:  Dr. [name]  Chief Complaint  Patient presents with  . Follow up procedures  . Diarrhea    HPI: Karen [name] is a 42 y.o. female here for rectal bleeding. This patient comes in with her mother after having a colonoscopy at the beginning of the month.  The patient had a stercoral ulcer seen in the rectum and polyps removed.  The patient is in a wheelchair and reports that she had a diarrheal illness which her mother also contracted.  The patient had profuse diarrhea and started to have bright red blood which lasted 3 days.  She states that it was a very large amount of blood over the last 3 days.  The diarrhea has since stopped and she has had no further rectal bleeding.  Due to the patient's phone call to the office with a report of rectal bleeding the patient had her blood sent off for a blood count that showed her hemoglobin to be the same as it was in March.  Current Outpatient Medications  Medication Sig Dispense Refill  . ADVAIR DISKUS 250-50 MCG/DOSE AEPB INHALE 1 PUFF TWICE A DAY 60 each 6  . albuterol (VENTOLIN HFA) 108 (90 Base) MCG/ACT inhaler Inhale 2 puffs into the lungs every 6 (six) hours as needed. 18 Inhaler 5  . amphetamine-dextroamphetamine (ADDERALL XR) 15 MG 24 hr capsule Take 1 capsule by mouth every morning. 20 capsule 0  . budesonide-formoterol (SYMBICORT) 160-4.5 MCG/ACT inhaler Inhale 2 puffs into the lungs 2 (two) times daily. 2 Inhaler 0  . busPIRone (BUSPAR) 10 MG tablet Take 1 tablet (10 mg total) by mouth 2 (two) times daily. 60 tablet 5  . celecoxib (CELEBREX) 200 MG capsule TAKE 1 CAPSULE BY MOUTH 2 TIMES A DAY 60 capsule 11  . DEXILANT 30 MG capsule TAKE ONE CAPSULE BY MOUTH DAILY 30 capsule 10  . diclofenac sodium (VOLTAREN) 1 % GEL APPLY AS NEEDED 3 TIMES A DAY 100 g 5  . diclofenac sodium (VOLTAREN) 1 % GEL APPLY AS NEEDED 3 TIMES A DAY 100 g 1  . DULoxetine (CYMBALTA)  60 MG capsule TAKE 2 CAPSULES (120 MG TOTAL) BY MOUTH DAILY. 60 capsule 11  . fentaNYL (DURAGESIC - DOSED MCG/HR) 12 MCG/HR Place 1 patch (12.5 mcg total) onto the skin every 3 (three) days. 10 patch 0  . fentaNYL (DURAGESIC - DOSED MCG/HR) 75 MCG/HR Place 1 patch (75 mcg total) onto the skin every 3 (three) days. 10 patch 0  . fexofenadine (ALLEGRA) 180 MG tablet Take 180 mg by mouth daily.    . fluticasone (FLONASE) 50 MCG/ACT nasal spray Place 2 sprays into both nostrils daily. 16 g 2  . gabapentin (NEURONTIN) 600 MG tablet TAKE 2 TABLETS BY MOUTH EVERY MORNING AND 3 TABLETS AT BEDTIME. 155 tablet 5  . haloperidol (HALDOL) 0.5 MG tablet TAKE 0.5 TABLETS (0.25 MG TOTAL) 2 (TWO) TIMES DAILY BY MOUTH. 31 tablet 1  . loratadine (CLARITIN) 10 MG tablet Take 10 mg by mouth daily.    Marland Kitchen LYRICA 300 MG capsule TAKE 1 CAPSULE BY MOUTH TWICE A DAY 180 capsule 2  . norethindrone (CAMILA) 0.35 MG tablet Take 1 tablet (0.35 mg total) by mouth daily. 28 tablet 9  . ondansetron (ZOFRAN-ODT) 4 MG disintegrating tablet DISSOLVE 1 TABLET IN MOUTH EVERY 8 HOURS FOR NAUSEA AND VOMITING 30 tablet 5  . QUEtiapine (SEROQUEL) 200 MG tablet TAKE 1 TABLET (200  MG TOTAL) BY MOUTH AT BEDTIME. 30 tablet 11  . sucralfate (CARAFATE) 1 g tablet TAKE 1 TABLET (1 G TOTAL) BY MOUTH 4 (FOUR) TIMES DAILY. 60 tablet 5  . tiZANidine (ZANAFLEX) 4 MG tablet TAKE 1/2 TO 1 TABLET BY MOUTH EVERY EIGHT HOURS 90 tablet 0   No current facility-administered medications for this visit.     Allergies as of 03/13/2018 - Review Complete 03/13/2018  Allergen Reaction Noted  . Chocolate flavor  03/18/2014  . Demerol [meperidine] Other (See Comments) 01/27/2014  . Keflex [cephalexin] Nausea And Vomiting 01/27/2014  . Morphine and related Nausea And Vomiting 01/27/2014  . Toradol [ketorolac tromethamine] Nausea And Vomiting 01/27/2014  . Augmentin  [amoxicillin-pot clavulanate]  03/10/2015  . Chocolate  03/10/2015  . Nsaids  03/10/2015  .  Strawberry extract  03/10/2015  . Tape Dermatitis 07/29/2015    ROS:  General: Negative for anorexia, weight loss, fever, chills, fatigue, weakness. ENT: Negative for hoarseness, difficulty swallowing , nasal congestion. CV: Negative for chest pain, angina, palpitations, dyspnea on exertion, peripheral edema.  Respiratory: Negative for dyspnea at rest, dyspnea on exertion, cough, sputum, wheezing.  GI: See history of present illness. GU:  Negative for dysuria, hematuria, urinary incontinence, urinary frequency, nocturnal urination.  Endo: Negative for unusual weight change.    Physical Examination:   BP 111/72   Pulse (!) 123   Ht 4' 11.5" (1.511 m)   BMI 29.59 kg/m   General: Well-nourished, well-developed in no acute distress.  Eyes: No icterus. Conjunctivae pink. Skin: Warm and dry, no jaundice.   Psych: Alert and cooperative, normal mood and affect.  Labs:    Imaging Studies: No results found.  Assessment and Plan:   Karen [name] is a 42 y.o. y/o female with rectal bleeding for 3 days that has now subsided.  The patient had a history of stercoral ulcers on her previous colonoscopy and that is likely the cause of her recent rectal bleeding.  The patient states that the rectal bleeding has completely stopped.  Her hemoglobin has been stable.  The patient has been told that if she has any further bleeding to contact our office.  The patient and her mother have been explained the plan and agree with it.    [name], MD. Marval Regal   Note: This dictation was prepared with Dragon dictation along with smaller phrase technology. Any transcriptional errors that result from this process are unintentional.

## 2018-03-19 ENCOUNTER — Other Ambulatory Visit: Payer: Self-pay | Admitting: Family Medicine

## 2018-03-23 ENCOUNTER — Telehealth: Payer: Self-pay | Admitting: Family Medicine

## 2018-03-23 ENCOUNTER — Telehealth: Payer: Self-pay | Admitting: Internal Medicine

## 2018-03-23 MED ORDER — FENTANYL 12 MCG/HR TD PT72: 12.5000 ug | MEDICATED_PATCH | TRANSDERMAL | 0 refills | Status: DC

## 2018-03-23 MED ORDER — FENTANYL 75 MCG/HR TD PT72: 75.0000 ug | MEDICATED_PATCH | TRANSDERMAL | 0 refills | Status: DC

## 2018-03-23 NOTE — Telephone Encounter (Signed)
LMTCB

## 2018-03-23 NOTE — Telephone Encounter (Signed)
Patient is requesting a refill on the following medications  fentaNYL (DURAGESIC - DOSED MCG/HR) 12 MCG/HR  fentaNYL (DURAGESIC - DOSED MCG/HR) 75 MCG/HR  [facility] [address]  She states that she will need this by Monday.

## 2018-03-26 NOTE — Telephone Encounter (Signed)
Attempted to call pt. I did not receive an answer. I have left a message for pt to return our call.  

## 2018-03-27 NOTE — Telephone Encounter (Signed)
Attempted to contact pt. I did not receive an answer. There was no option for me to leave a message. Will try back.  

## 2018-03-28 NOTE — Telephone Encounter (Signed)
Attempted to call patient today regarding rx of Adderall. I did not receive an answer at time of call. I have left a voicemail message for pt to return call. X4  Per triage protocol,  this message will be closed today as we have tried to reach pt over 3 times with no response. When the patient returns call a new message can be opened at that time.

## 2018-03-29 ENCOUNTER — Ambulatory Visit (INDEPENDENT_AMBULATORY_CARE_PROVIDER_SITE_OTHER): Payer: BC Managed Care – PPO | Admitting: Internal Medicine

## 2018-03-29 ENCOUNTER — Encounter: Payer: Self-pay | Admitting: Internal Medicine

## 2018-03-29 DIAGNOSIS — J4530 Mild persistent asthma, uncomplicated: Secondary | ICD-10-CM | POA: Diagnosis not present

## 2018-03-29 DIAGNOSIS — G4739 Other sleep apnea: Secondary | ICD-10-CM

## 2018-03-29 MED ORDER — AMPHETAMINE-DEXTROAMPHET ER 15 MG PO CP24: 15.0000 mg | ORAL_CAPSULE | ORAL | 0 refills | Status: DC

## 2018-03-29 NOTE — Assessment & Plan Note (Signed)
She seems to be doing much better with BiPAP ST and Adderall XR.  We have reviewed both carefully.  She needs to improve BiPAP compliance as discussed.  Mask fit is an issue.  She and her mother needs some encouragement to reach out to the DME company since they missed an appointment for mask fitting. Plan- continue BIPAP ST 24/13, back up rate 16/ [facility]           Continue Adderall XR 15 mg           Discuss Haldol availability or alternatives w Dr Gaynell Face

## 2018-03-29 NOTE — Assessment & Plan Note (Signed)
Ok to continue Symbicort maintenance, with albuterol hfa rescue inhaler

## 2018-03-29 NOTE — Progress Notes (Signed)
HPI  female never smoker referred by Dr. Gaynell Face for management of severe mixed sleep apnea (central predominant), chronic hypoxic and hypercapnic respiratory failure, complicated by Ehlers-Danlos syndrome, fibromyalgia, seasonal allergic rhinitis, asthma, degenerative disc disease, rheumatoid arthritis, question of past seizure disorder, Tics BiPAP ST titration sleep study 10/11/2017-  Titrated to 24/ 13, back up rate 16 with minimal O2 sat 87%. Residual CAI 13.7/ hr NPSG 08/20/17-severe Mixed Sleep apnea (Central predominant)-AHI 134.1./hour, desaturation to 80%, 38.9 minutes recorded <= 88%, body weight 140 pounds, Central Apnea Index 119.8/ hr Office Spirometry 09/29/17-mild restriction of exhaled volume.  Flow is normal for volume.  FVC 2.29/74%, FEV1 2.0/78%, ratio 2.87, FEF 25-75% 2.87/101%    -------------------------------------------------------------------------------------   12/18/2017-42 year old female never smoker referred by Dr. Gaynell Face for management of severe mixed sleep apnea (central predominant), chronic hypoxic and hypercapnic respiratory failure, complicated by Ehlers-Danlos syndrome, fibromyalgia, seasonal allergic rhinitis, asthma, degenerative disc disease, rheumatoid arthritis, question of past seizure disorder, Tics BiPAP ST titration sleep study 10/11/2017-  Titrated to 24/ 13, back up rate 16 with minimal O2 sat 87%. Residual CAI 13.7/ hr. Download 93% compliance AHI 5.0/hour.  Mask is not sealing well and she says it hurts her nose because she is trying to strap it on tightly.  Needs to be refit but it does help her sleep.  Mother still notices her dozing off during the day with pauses in her breathing then as well. Increased asthma.  Using mother's rescue inhaler.  03/29/2018- 42 year old female never smoker referred by Dr. Gaynell Face for management of severe mixed sleep apnea (central predominant), chronic hypoxic and hypercapnic respiratory failure, complicated by  Ehlers-Danlos syndrome, fibromyalgia, seasonal allergic rhinitis, asthma, degenerative disc disease, rheumatoid arthritis, question of past seizure disorder, Tics BIPAP ST 24/13, back up rate 16/ [facility], continuing adderall xr 15, and refit mask We added Symbicort for trial for asthma, continuing albuterol hfa -----OSA: DME: [facility]. Pt wears BiPAP nightly and DL attached. Pt would like to discuss Haldol Rx.      Mother is here She had an appointment with her [facility] to refit mask but was unable to keep it.  I asked her to call them and reschedule.  She is taking her mask off in her sleep at times which contributes to low compliance scores. Compliance download 57% (mostly short nights) AHI 5.8/hour.  She feels she breathes okay with it-much better than without.  Wheezing and dyspnea have been better controlled using a maintenance inhaler.  She now has Symbicort 160 with occasional use of rescue inhaler. Haldol helps with sleep at night but has been on prolonged backorder.  I asked her to contact Dr. Melanee Left office for help with this issue. Between Adderall and BiPAP she reports doing much better overall.  (N.B.---Our next step if BiPAP ST proves insufficient, would be to look at [facility], perhaps EPAP 6, minimum PS 5, maximum PS 12)  ROS-see HPI  + = positive Constitutional:    weight loss, night sweats, fevers, chills, +fatigue, lassitude. HEENT:    headaches, difficulty swallowing, tooth/dental problems, sore throat,       sneezing, itching, ear ache, nasal congestion, post nasal drip, snoring CV:    chest pain, orthopnea, PND, swelling in lower extremities, anasarca,  dizziness, palpitations Resp:  + shortness of breath with exertion or at rest.                productive cough,   non-productive cough, coughing up of blood.              change in color of mucus.  +wheezing.   Skin:    rash or lesions. GI:  No-    heartburn, indigestion, abdominal pain, nausea, vomiting, diarrhea,                 change in bowel habits, loss of appetite GU: dysuria, change in color of urine, no urgency or frequency.   flank pain. MS:   joint pain, stiffness, decreased range of motion, back pain. Neuro-     + HPI Psych:  change in mood or affect.  depression or anxiety.   memory loss.  OBJ- Physical Exam General- Awake, Oriented, Affect-appropriate, Distress- none acute,                 +Eyelid droop/ flaccid face make her look drowsy. +Power chair.  Skin- rash-none, lesions- none, excoriation- none Lymphadenopathy- none Head- atraumatic            Eyes- Gross vision intact, PERRLA, conjunctivae and secretions clear            Ears- Hearing, canals-normal            Nose- + mild turbinate edemea, +Septal dev, mucus, polyps, erosion, perforation             Throat- Mallampati III , mucosa clear , drainage- none, tonsils- atrophic Neck- flexible , trachea midline, no stridor , thyroid nl, carotid no bruit Chest - symmetrical excursion , unlabored           Heart/CV- RRR , no murmur heard , no gallop  , no rub, nl s1 s2                           - JVD- none , edema- none, stasis changes- none, varices- none           Lung- clear to P&A, wheeze- none, cough- none , dullness-none, rub- none           Chest wall-  Abd-  Br/ Gen/ Rectal- Not done, not indicated Extrem- cyanosis- none, clubbing, none, atrophy- none, strength- nl Neuro-+ very little muscle movement, slight face, but mentation seems appropriate.

## 2018-03-29 NOTE — Patient Instructions (Signed)
We are dropping Advair off your list- use Symbicort as your maintenance asthma inhaler  Script for Adderall changed to 31 days and printed  Please call your [facility] office for an appointment for mask refitting  Please let Dr Melanee Left office know that Haldol is on long-term back -order, and see if they can suggest how to deal with this.  We can continue BIPAP ST 24/13, back up rate 16

## 2018-04-01 ENCOUNTER — Other Ambulatory Visit: Payer: Self-pay | Admitting: Family Medicine

## 2018-04-17 ENCOUNTER — Ambulatory Visit (INDEPENDENT_AMBULATORY_CARE_PROVIDER_SITE_OTHER): Payer: Medicare Other | Admitting: Family Medicine

## 2018-04-17 ENCOUNTER — Encounter: Payer: Self-pay | Admitting: Family Medicine

## 2018-04-17 VITALS — BP 100/64 | HR 80 | Temp 97.9°F | Resp 16 | Wt 140.0 lb

## 2018-04-17 DIAGNOSIS — R300 Dysuria: Secondary | ICD-10-CM

## 2018-04-17 LAB — POCT URINALYSIS DIPSTICK
Bilirubin, UA: NEGATIVE
Blood, UA: NEGATIVE
Glucose, UA: NEGATIVE
Ketones, UA: NEGATIVE
Nitrite, UA: POSITIVE
Protein, UA: NEGATIVE
Spec Grav, UA: <=1.005 — AB (ref 1.010–1.025)
Urobilinogen, UA: 0.2 E.U./dL
pH, UA: 6 (ref 5.0–8.0)

## 2018-04-17 MED ORDER — SULFAMETHOXAZOLE-TRIMETHOPRIM 800-160 MG PO TABS: 1.0000 | ORAL_TABLET | Freq: Two times a day (BID) | ORAL | 0 refills | Status: DC

## 2018-04-17 NOTE — Patient Instructions (Addendum)
We will call with the urine culture results. Continue to increase fluids.

## 2018-04-17 NOTE — Progress Notes (Signed)
  Subjective:     Patient ID: Karen [name], female   DOB: 1976-07-30, 42 y.o.   MRN: [id number] Chief Complaint  Patient presents with  . Dysuria    Patient comes in office today with complaints of dysuria for 3 days. Patient states that she has taken otc Uricalm and Cystex with no relief.    HPI States she has not had a UTI in a long time. Accompanied by her mother today.  Review of Systems  Constitutional: Negative for chills and fever.  Genitourinary: Negative for vaginal discharge.       Objective:   Physical Exam  Constitutional: She appears well-developed and well-nourished. No distress.  Genitourinary:  Genitourinary Comments: Moderate right CVA tenderness on percussion       Assessment:    1. Dysuria - POCT urinalysis dipstick - Urine Culture - sulfamethoxazole-trimethoprim (BACTRIM DS,SEPTRA DS) 800-160 MG tablet; Take 1 tablet by mouth 2 (two) times daily.  Dispense: 14 tablet; Refill: 0    Plan:    Further f/u pending urine culture results.

## 2018-04-19 ENCOUNTER — Telehealth: Payer: Self-pay

## 2018-04-19 ENCOUNTER — Other Ambulatory Visit: Payer: Self-pay

## 2018-04-19 NOTE — Telephone Encounter (Signed)
Patient called requesting urine culture results. Please call patient back at [phone] or [phone].

## 2018-04-19 NOTE — Telephone Encounter (Signed)
Patient calling requesting refills for the following medications: fentaNYL (DURAGESIC - DOSED MCG/HR) 12 MCG/HR  fentaNYL (DURAGESIC - DOSED MCG/HR) 75 MCG/HR

## 2018-04-20 MED ORDER — FENTANYL 12 MCG/HR TD PT72: 12.5000 ug | MEDICATED_PATCH | TRANSDERMAL | 0 refills | Status: DC

## 2018-04-20 MED ORDER — FENTANYL 75 MCG/HR TD PT72: 75.0000 ug | MEDICATED_PATCH | TRANSDERMAL | 0 refills | Status: DC

## 2018-04-20 NOTE — Telephone Encounter (Signed)
Please let her know I have only gotten a preliminary report which documents presence of infection but not the organism or antibiotic sensitivities. I will hopefully get the final report by the end of the day.

## 2018-04-21 ENCOUNTER — Other Ambulatory Visit: Payer: Self-pay | Admitting: Family Medicine

## 2018-04-22 LAB — URINE CULTURE

## 2018-04-23 NOTE — Telephone Encounter (Signed)
[id number] 

## 2018-04-23 NOTE — Telephone Encounter (Signed)
-----   Message from Carmon Ginsberg, [place] sent at 04/23/2018  7:20 AM EDT ----- You have an E. Coli infection which is sensitive to the antibiotic you are taking.

## 2018-04-24 ENCOUNTER — Other Ambulatory Visit: Payer: Self-pay | Admitting: Family Medicine

## 2018-04-24 DIAGNOSIS — R300 Dysuria: Secondary | ICD-10-CM

## 2018-04-24 MED ORDER — SULFAMETHOXAZOLE-TRIMETHOPRIM 800-160 MG PO TABS: 1.0000 | ORAL_TABLET | Freq: Two times a day (BID) | ORAL | 0 refills | Status: DC

## 2018-04-24 NOTE — Telephone Encounter (Signed)
I will extend the course of treatment for 3 more days. If not resolved will need to come in for another urinalysis and culture.

## 2018-04-24 NOTE — Telephone Encounter (Signed)
Please advise. KW 

## 2018-04-24 NOTE — Telephone Encounter (Signed)
Pt returned call and request call back. Pt request to call her on her home# first and if she doesn't answer to try her work#. Please advise. Thanks TNP

## 2018-04-24 NOTE — Telephone Encounter (Signed)
Patients mother/guardian advised. KW

## 2018-04-24 NOTE — Telephone Encounter (Signed)
Pt stated that she was just advised of her urine culture results and that she has a bladder infection and continue taking sulfamethoxazole-trimethoprim (BACTRIM DS,SEPTRA DS) 800-160 MG tablet. Pt stated that she only has one dose left to take tonight. Pt is requesting refill for sulfamethoxazole-trimethoprim (BACTRIM DS,SEPTRA DS) 800-160 MG tablet to be sent to [facility] because she isn't feeling better. Please advise. Thanks TNP

## 2018-04-25 NOTE — Telephone Encounter (Signed)
Patients mother advised. KW

## 2018-04-29 ENCOUNTER — Other Ambulatory Visit (INDEPENDENT_AMBULATORY_CARE_PROVIDER_SITE_OTHER): Payer: Self-pay | Admitting: Pediatrics

## 2018-04-29 DIAGNOSIS — G2569 Other tics of organic origin: Secondary | ICD-10-CM

## 2018-05-03 ENCOUNTER — Telehealth (INDEPENDENT_AMBULATORY_CARE_PROVIDER_SITE_OTHER): Payer: Self-pay | Admitting: Pediatrics

## 2018-05-03 DIAGNOSIS — G2569 Other tics of organic origin: Secondary | ICD-10-CM

## 2018-05-03 MED ORDER — HALOPERIDOL 0.5 MG PO TABS: 0.2500 mg | ORAL_TABLET | Freq: Two times a day (BID) | ORAL | 0 refills | Status: DC

## 2018-05-03 NOTE — Telephone Encounter (Signed)
Rx has been electronically sent to the pharmacy 

## 2018-05-03 NOTE — Telephone Encounter (Signed)
°  Who's calling (name and relationship to patient) : Karen [name] (Patient) Best contact number: [phone] Provider they see: Dr. Gaynell Face  Reason for call: Patient stated she needs refill on Haloperidol 0.5 mg tab.      PRESCRIPTION REFILL ONLY  Name of prescription: Haloperidol 0.5 tab [facility] [address], [place]

## 2018-05-07 ENCOUNTER — Telehealth: Payer: Self-pay | Admitting: Internal Medicine

## 2018-05-07 MED ORDER — AMPHETAMINE-DEXTROAMPHET ER 15 MG PO CP24: 15.0000 mg | ORAL_CAPSULE | ORAL | 0 refills | Status: DC

## 2018-05-07 NOTE — Telephone Encounter (Signed)
Called and spoke with patients mother, she stated that she is needing a refill of patients adderall. Is this ok to refill. Patient last seen on 8.15.19 with refill sent on 8.9.19 with quantity of 31 and no refills. Please advise, thank you.   Current Outpatient Medications on File Prior to Visit  Medication Sig Dispense Refill  . ADVAIR DISKUS 250-50 MCG/DOSE AEPB INHALE 1 PUFF TWICE A DAY 60 each 6  . albuterol (VENTOLIN HFA) 108 (90 Base) MCG/ACT inhaler Inhale 2 puffs into the lungs every 6 (six) hours as needed. 18 Inhaler 5  . amphetamine-dextroamphetamine (ADDERALL XR) 15 MG 24 hr capsule Take 1 capsule by mouth every morning. 31 capsule 0  . budesonide-formoterol (SYMBICORT) 160-4.5 MCG/ACT inhaler Inhale 2 puffs into the lungs 2 (two) times daily. 2 Inhaler 0  . busPIRone (BUSPAR) 10 MG tablet Take 1 tablet (10 mg total) by mouth 2 (two) times daily. 60 tablet 5  . celecoxib (CELEBREX) 200 MG capsule TAKE 1 CAPSULE BY MOUTH 2 TIMES A DAY 60 capsule 11  . CIPRODEX OTIC suspension APPLY FOUR DROPS TO AFFECTED EAR TWICE DAILY FOR 7 DAYS  10  . DEXILANT 30 MG capsule TAKE ONE CAPSULE BY MOUTH DAILY 30 capsule 5  . diclofenac sodium (VOLTAREN) 1 % GEL APPLY AS NEEDED 3 TIMES A DAY 100 g 3  . DULoxetine (CYMBALTA) 60 MG capsule TAKE 2 CAPSULES (120 MG TOTAL) BY MOUTH DAILY. 60 capsule 11  . fentaNYL (DURAGESIC - DOSED MCG/HR) 12 MCG/HR Place 1 patch (12.5 mcg total) onto the skin every 3 (three) days. 10 patch 0  . fentaNYL (DURAGESIC - DOSED MCG/HR) 75 MCG/HR Place 1 patch (75 mcg total) onto the skin every 3 (three) days. 10 patch 0  . fexofenadine (ALLEGRA) 180 MG tablet Take 180 mg by mouth daily.    . fluticasone (FLONASE) 50 MCG/ACT nasal spray Place 2 sprays into both nostrils daily. 16 g 2  . gabapentin (NEURONTIN) 600 MG tablet TAKE 2 TABLETS BY MOUTH EVERY MORNING AND 3 TABLETS AT BEDTIME. 155 tablet 5  . haloperidol (HALDOL) 0.5 MG tablet Take 0.5 tablets (0.25 mg total) by mouth 2  (two) times daily. 31 tablet 0  . loratadine (CLARITIN) 10 MG tablet Take 10 mg by mouth daily.    Marland Kitchen LYRICA 300 MG capsule TAKE 1 CAPSULE BY MOUTH TWICE A DAY 180 capsule 2  . norethindrone (CAMILA) 0.35 MG tablet Take 1 tablet (0.35 mg total) by mouth daily. 28 tablet 9  . ondansetron (ZOFRAN-ODT) 4 MG disintegrating tablet DISSOLVE 1 TABLET IN MOUTH EVERY 8 HOURS FOR NAUSEA AND VOMITING (Patient not taking: Reported on 04/17/2018) 30 tablet 5  . QUEtiapine (SEROQUEL) 200 MG tablet TAKE 1 TABLET (200 MG TOTAL) BY MOUTH AT BEDTIME. 30 tablet 11  . sulfamethoxazole-trimethoprim (BACTRIM DS,SEPTRA DS) 800-160 MG tablet Take 1 tablet by mouth 2 (two) times daily. 6 tablet 0  . tiZANidine (ZANAFLEX) 4 MG tablet TAKE 1/2 TO 1 TABLET BY MOUTH EVERY EIGHT HOURS**NEEDS OFFICE VISIT** 90 tablet 4   No current facility-administered medications on file prior to visit.     Allergies  Allergen Reactions  . Chocolate Flavor     Other reaction(s): Other (See Comments) Wheezing, acne  . Demerol [Meperidine] Other (See Comments)    Slow to wake up when this drug is given.   Marland Kitchen Keflex [Cephalexin] Nausea And Vomiting  . Morphine And Related Nausea And Vomiting  . Toradol [Ketorolac Tromethamine] Nausea And Vomiting  .  Augmentin  [Amoxicillin-Pot Clavulanate]   . Chocolate     GI distress  . Nsaids     patient develops ulcers  . Strawberry Extract     GI distress  . Tape Dermatitis

## 2018-05-07 NOTE — Telephone Encounter (Signed)
Rx printed and signed. I called [name] and advised her that Rx was ready to pick up. She verbalized understanding. Rx placed up front in brown folder. Nothing further is needed.

## 2018-05-07 NOTE — Telephone Encounter (Signed)
Ok to refill as before 

## 2018-05-10 ENCOUNTER — Other Ambulatory Visit: Payer: Self-pay | Admitting: Family Medicine

## 2018-05-10 DIAGNOSIS — H6983 Other specified disorders of Eustachian tube, bilateral: Secondary | ICD-10-CM

## 2018-05-10 DIAGNOSIS — M47814 Spondylosis without myelopathy or radiculopathy, thoracic region: Secondary | ICD-10-CM | POA: Diagnosis not present

## 2018-05-10 DIAGNOSIS — J3089 Other allergic rhinitis: Secondary | ICD-10-CM

## 2018-05-15 DIAGNOSIS — M791 Myalgia, unspecified site: Secondary | ICD-10-CM | POA: Diagnosis not present

## 2018-05-24 ENCOUNTER — Other Ambulatory Visit: Payer: Self-pay | Admitting: Family Medicine

## 2018-05-24 MED ORDER — FENTANYL 75 MCG/HR TD PT72: 75.0000 ug | MEDICATED_PATCH | TRANSDERMAL | 0 refills | Status: DC

## 2018-05-24 MED ORDER — FENTANYL 12 MCG/HR TD PT72: 12.5000 ug | MEDICATED_PATCH | TRANSDERMAL | 0 refills | Status: DC

## 2018-05-24 NOTE — Telephone Encounter (Signed)
Pt contacted office for refill request on the following medications:  1. fentaNYL (DURAGESIC - DOSED MCG/HR) 12 MCG/HR  2. fentaNYL ([place] - DOSED MCG/HR) 75 MCG/HR  [facility] [address]  Last Rx: 04/20/18 Please advise. Thanks TNP

## 2018-05-24 NOTE — Telephone Encounter (Signed)
Please review. Thanks!  

## 2018-05-27 ENCOUNTER — Other Ambulatory Visit (INDEPENDENT_AMBULATORY_CARE_PROVIDER_SITE_OTHER): Payer: Self-pay | Admitting: Pediatrics

## 2018-05-27 DIAGNOSIS — G2569 Other tics of organic origin: Secondary | ICD-10-CM

## 2018-06-07 ENCOUNTER — Other Ambulatory Visit (INDEPENDENT_AMBULATORY_CARE_PROVIDER_SITE_OTHER): Payer: Self-pay | Admitting: Pediatrics

## 2018-06-07 DIAGNOSIS — G2569 Other tics of organic origin: Secondary | ICD-10-CM

## 2018-06-08 ENCOUNTER — Other Ambulatory Visit: Payer: Self-pay | Admitting: Family Medicine

## 2018-06-08 NOTE — Telephone Encounter (Signed)
Pt requesting refills on:  LYRICA 300 MG capsule     Please fill at:  [id number] [name], [place] [phone] (Phone) [phone] (Fax)   Thanks, [employer]

## 2018-06-09 MED ORDER — PREGABALIN 300 MG PO CAPS: 300.0000 mg | ORAL_CAPSULE | Freq: Two times a day (BID) | ORAL | 2 refills | Status: DC

## 2018-06-11 ENCOUNTER — Telehealth: Payer: Self-pay | Admitting: Family Medicine

## 2018-06-11 DIAGNOSIS — Z01419 Encounter for gynecological examination (general) (routine) without abnormal findings: Secondary | ICD-10-CM

## 2018-06-11 NOTE — Telephone Encounter (Signed)
Pt is requesting a referral to GYN so she can get her PAP. Please advise. Thanks TNP

## 2018-06-14 ENCOUNTER — Telehealth: Payer: Self-pay | Admitting: Obstetrics & Gynecology

## 2018-06-14 ENCOUNTER — Telehealth: Payer: Self-pay | Admitting: Internal Medicine

## 2018-06-14 MED ORDER — AMPHETAMINE-DEXTROAMPHET ER 15 MG PO CP24: 15.0000 mg | ORAL_CAPSULE | ORAL | 0 refills | Status: DC

## 2018-06-14 NOTE — Telephone Encounter (Signed)
Pt's mother is requesting a refill on Adderall XR 15mg . Pt's last OV was on 03/29/18. Medication was last refilled on 05/07/18 #31.  CY - please advise on refill. Thanks.

## 2018-06-14 NOTE — Telephone Encounter (Signed)
Ok to refill 

## 2018-06-14 NOTE — Telephone Encounter (Signed)
Spoke with patient. She is aware that the RX has been placed up front for pickup. Nothing further needed at time of call.

## 2018-06-14 NOTE — Telephone Encounter (Signed)
[facility] referring Well woman exam with routine gynecological exam. Called and left voicemail for patient to call back to be schedule

## 2018-06-22 ENCOUNTER — Other Ambulatory Visit: Payer: Self-pay

## 2018-06-22 MED ORDER — FENTANYL 12 MCG/HR TD PT72: 12.5000 ug | MEDICATED_PATCH | TRANSDERMAL | 0 refills | Status: DC

## 2018-06-22 MED ORDER — FENTANYL 75 MCG/HR TD PT72: 75.0000 ug | MEDICATED_PATCH | TRANSDERMAL | 0 refills | Status: DC

## 2018-06-29 ENCOUNTER — Ambulatory Visit: Payer: Medicare Other | Admitting: Obstetrics and Gynecology

## 2018-07-02 ENCOUNTER — Ambulatory Visit: Payer: Medicare Other | Admitting: Obstetrics & Gynecology

## 2018-07-17 ENCOUNTER — Telehealth: Payer: Self-pay | Admitting: Internal Medicine

## 2018-07-17 NOTE — Telephone Encounter (Signed)
CY Please advise on refill. Thanks.  

## 2018-07-17 NOTE — Telephone Encounter (Signed)
OK to refill as before

## 2018-07-18 MED ORDER — AMPHETAMINE-DEXTROAMPHET ER 15 MG PO CP24: 15.0000 mg | ORAL_CAPSULE | ORAL | 0 refills | Status: DC

## 2018-07-18 NOTE — Telephone Encounter (Signed)
I called pt but there was no answer. I left a detailed message that her Rx was ready to pick up. I advised her to call if she had any questions.

## 2018-07-19 ENCOUNTER — Encounter: Payer: Self-pay | Admitting: Obstetrics & Gynecology

## 2018-07-19 ENCOUNTER — Other Ambulatory Visit (HOSPITAL_COMMUNITY)
Admission: RE | Admit: 2018-07-19 | Discharge: 2018-07-19 | Disposition: A | Discharge status: Home / Self Care | Payer: Medicare Other | Source: Ambulatory Visit | Attending: Obstetrics & Gynecology | Admitting: Obstetrics & Gynecology

## 2018-07-19 ENCOUNTER — Ambulatory Visit (INDEPENDENT_AMBULATORY_CARE_PROVIDER_SITE_OTHER): Payer: Medicare Other | Admitting: Obstetrics & Gynecology

## 2018-07-19 VITALS — BP 110/70 | Ht 59.5 in | Wt 145.0 lb

## 2018-07-19 DIAGNOSIS — Z1239 Encounter for other screening for malignant neoplasm of breast: Secondary | ICD-10-CM

## 2018-07-19 DIAGNOSIS — Z124 Encounter for screening for malignant neoplasm of cervix: Secondary | ICD-10-CM | POA: Insufficient documentation

## 2018-07-19 DIAGNOSIS — Z1151 Encounter for screening for human papillomavirus (HPV): Secondary | ICD-10-CM | POA: Insufficient documentation

## 2018-07-19 DIAGNOSIS — Z0000 Encounter for general adult medical examination without abnormal findings: Secondary | ICD-10-CM

## 2018-07-19 DIAGNOSIS — R3989 Other symptoms and signs involving the genitourinary system: Secondary | ICD-10-CM

## 2018-07-19 DIAGNOSIS — Z23 Encounter for immunization: Secondary | ICD-10-CM | POA: Diagnosis not present

## 2018-07-19 DIAGNOSIS — N3281 Overactive bladder: Secondary | ICD-10-CM

## 2018-07-19 NOTE — Patient Instructions (Signed)
PAP every three years Labs yearly (with PCP)  Stop Camila birth control pill (hormone)

## 2018-07-19 NOTE — Progress Notes (Signed)
HPI:      [name] is a 42 y.o. G0P0000 who LMP was No LMP recorded., she presents today for her annual examination. The patient has no complaints today.  She is here for female exam.  She has been on Camila pill for years to suppress her periods, although she does not remember why this was started.  Her periods in past were not heavy or painful.  Not sexually active.  She does have many different urinary complaints, has seen urology years ago, no help.  Freq, urgency, leakage, but also retention and incomplete emptying.  For 10 years.  Her last pap: years ago, normal and last mammogram: patient has never had a mammogram. The patient does perform self breast exams.  There is notable family history of breast or ovarian cancer in her family.  The patient has regular exercise: no.  The patient denies current symptoms of depression.    GYN History: Contraception: abstinence  PMHx: Past Medical History:  Diagnosis Date  . Ehlers-Danlos syndrome   . Headache   . Sleep apnea   . Tics of organic origin   . Transient alteration of awareness    Past Surgical History:  Procedure Laterality Date  . COLONOSCOPY WITH PROPOFOL N/A 02/13/2018   Procedure: COLONOSCOPY WITH PROPOFOL;  Surgeon: [name], MD;  Location[facility] END[facility]COPY;  Service: Endoscopy;  Laterality: N/A;  . DILATION AND CURETTAGE OF UTERUS  2009  . ESOPHAGOGASTRODUODENOSCOPY (EGD) WITH PROPOFOL  02/13/2018   Procedure: ESOPHAGOGASTRODUODENOSCOPY (EGD) WITH PROPOFOL;  Surgeon: [name], MD;  Location: [facility] ENDOSCOPY;  Service: Endoscopy;;  . EYE SURGERY Left 1990   3 Surgeries on left eye to correct crossed eye  . LAPAROSCOPY Left 2003   Fallopian Tube  . MANDIBLE SURGERY  1998  . OTHER SURGICAL HISTORY Right 1987   3 Surgeries to repair broken arm  . OTHER SURGICAL HISTORY Left    3 surgeries on her left thigh as an infant  . OTHER SURGICAL HISTORY  1998   Jaw surgery  . Right arm surgery  1987   x 3 due to  fracture  . TONSILLECTOMY  12/13/2002   Family History  Problem Relation Age of Onset  . Depression Mother   . Stroke Mother   . Fibromyalgia Mother   . Osteoarthritis Mother   . Asthma Brother   . Depression Brother   . Migraines Brother   . Asperger's syndrome Brother   . Other Brother        Ehlers-Danlos Syndrome  . Cancer Maternal Aunt   . Asperger's syndrome Maternal Aunt   . Heart disease Paternal Aunt   . Heart disease Paternal Uncle   . Cervical cancer Maternal Grandmother   . Osteoporosis Maternal Grandmother        Died at 19  . Osteoarthritis Maternal Grandmother   . Colon cancer Maternal Grandfather   . Asthma Maternal Grandfather   . Asperger's syndrome Maternal Grandfather   . Emphysema Maternal Grandfather        Died at 4  . Prostate cancer Maternal Grandfather   . Heart attack Paternal Grandfather        Died at 60  . Asthma Paternal Grandfather   . Tuberculosis Paternal Grandfather   . Cancer Cousin   . Asperger's syndrome Cousin   . Asperger's syndrome Other   . Heart Problems Paternal Grandmother        Died at 71  . Ehlers-Danlos syndrome Father   .  Ehlers-Danlos syndrome Brother    Social History   Tobacco Use  . Smoking status: Never Smoker  . Smokeless tobacco: Never Used  Substance Use Topics  . Alcohol use: No    Alcohol/week: 0.0 standard drinks  . Drug use: No    Current Outpatient Medications:  .  ADVAIR DISKUS 250-50 MCG/DOSE AEPB, INHALE 1 PUFF TWICE A DAY, Disp: 60 each, Rfl: 6 .  albuterol (VENTOLIN HFA) 108 (90 Base) MCG/ACT inhaler, Inhale 2 puffs into the lungs every 6 (six) hours as needed., Disp: 18 Inhaler, Rfl: 5 .  amphetamine-dextroamphetamine (ADDERALL XR) 15 MG 24 hr capsule, Take 1 capsule by mouth every morning., Disp: 31 capsule, Rfl: 0 .  budesonide-formoterol (SYMBICORT) 160-4.5 MCG/ACT inhaler, Inhale 2 puffs into the lungs 2 (two) times daily., Disp: 2 Inhaler, Rfl: 0 .  busPIRone (BUSPAR) 10 MG tablet, Take  1 tablet (10 mg total) by mouth 2 (two) times daily., Disp: 60 tablet, Rfl: 5 .  celecoxib (CELEBREX) 200 MG capsule, TAKE 1 CAPSULE BY MOUTH 2 TIMES A DAY, Disp: 60 capsule, Rfl: 11 .  CIPRODEX OTIC suspension, APPLY FOUR DROPS TO AFFECTED EAR TWICE DAILY FOR 7 DAYS, Disp: , Rfl: 10 .  DEXILANT 30 MG capsule, TAKE ONE CAPSULE BY MOUTH DAILY, Disp: 30 capsule, Rfl: 5 .  diclofenac sodium (VOLTAREN) 1 % GEL, APPLY AS NEEDED 3 TIMES A DAY, Disp: 100 g, Rfl: 3 .  DULoxetine (CYMBALTA) 60 MG capsule, TAKE 2 CAPSULES (120 MG TOTAL) BY MOUTH DAILY., Disp: 60 capsule, Rfl: 11 .  fentaNYL (DURAGESIC - DOSED MCG/HR) 12 MCG/HR, Place 1 patch (12.5 mcg total) onto the skin every 3 (three) days., Disp: 10 patch, Rfl: 0 .  fentaNYL (DURAGESIC - DOSED MCG/HR) 75 MCG/HR, Place 1 patch (75 mcg total) onto the skin every 3 (three) days., Disp: 10 patch, Rfl: 0 .  fexofenadine (ALLEGRA) 180 MG tablet, Take 180 mg by mouth daily., Disp: , Rfl:  .  fluticasone (FLONASE) 50 MCG/ACT nasal spray, SPRAY 2 SPRAYS INTO EACH NOSTRIL EVERY DAY, Disp: 16 g, Rfl: 2 .  gabapentin (NEURONTIN) 600 MG tablet, TAKE 2 TABLETS BY MOUTH EVERY MORNING AND 3 TABLETS AT BEDTIME., Disp: 155 tablet, Rfl: 5 .  haloperidol (HALDOL) 0.5 MG tablet, Take 0.5 tablets (0.25 mg total) by mouth 2 (two) times daily., Disp: 31 tablet, Rfl: 0 .  haloperidol (HALDOL) 0.5 MG tablet, TAKE 0.5 TABLETS (0.25 MG TOTAL) 2 (TWO) TIMES DAILY BY MOUTH., Disp: 31 tablet, Rfl: 0 .  loratadine (CLARITIN) 10 MG tablet, Take 10 mg by mouth daily., Disp: , Rfl:  .  ondansetron (ZOFRAN-ODT) 4 MG disintegrating tablet, DISSOLVE 1 TABLET IN MOUTH EVERY 8 HOURS FOR NAUSEA AND VOMITING, Disp: 30 tablet, Rfl: 5 .  pregabalin (LYRICA) 300 MG capsule, Take 1 capsule (300 mg total) by mouth 2 (two) times daily., Disp: 180 capsule, Rfl: 2 .  QUEtiapine (SEROQUEL) 200 MG tablet, TAKE 1 TABLET (200 MG TOTAL) BY MOUTH AT BEDTIME., Disp: 30 tablet, Rfl: 11 .   sulfamethoxazole-trimethoprim (BACTRIM DS,SEPTRA DS) 800-160 MG tablet, Take 1 tablet by mouth 2 (two) times daily., Disp: 6 tablet, Rfl: 0 .  tiZANidine (ZANAFLEX) 4 MG tablet, TAKE 1/2 TO 1 TABLET BY MOUTH EVERY EIGHT HOURS**NEEDS OFFICE VISIT**, Disp: 90 tablet, Rfl: 4 Allergies: Chocolate flavor; Demerol [meperidine]; Keflex [cephalexin]; Morphine and related; Toradol [ketorolac tromethamine]; Augmentin  [amoxicillin-pot clavulanate]; Chocolate; Nsaids; Strawberry extract; and Tape  Review of Systems  Constitutional: Positive for malaise/fatigue. Negative for chills  and fever.  HENT: Negative for congestion, sinus pain and sore throat.   Eyes: Negative for blurred vision and pain.  Respiratory: Negative for cough and wheezing.   Cardiovascular: Negative for chest pain and leg swelling.  Gastrointestinal: Negative for abdominal pain, constipation, diarrhea, heartburn, nausea and vomiting.  Genitourinary: Positive for frequency and urgency. Negative for dysuria and hematuria.  Musculoskeletal: Positive for joint pain. Negative for back pain, myalgias and neck pain.  Skin: Negative for itching and rash.  Neurological: Positive for tingling and headaches. Negative for dizziness, tremors and weakness.  Endo/Heme/Allergies: Bruises/bleeds easily.  Psychiatric/Behavioral: Positive for depression. The patient is nervous/anxious. The patient does not have insomnia.     Objective: BP 110/70   Ht 4' 11.5" (1.511 m)   Wt 145 lb (65.8 kg)   BMI 28.80 kg/m   Filed Weights   07/19/18 1336  Weight: 145 lb (65.8 kg)   Body mass index is 28.8 kg/m. Physical Exam  Constitutional: She is oriented to person, place, and time. She appears well-developed and well-nourished. No distress.  Genitourinary: Rectum normal, vagina normal and uterus normal. Pelvic exam was performed with patient supine. There is no rash or lesion on the right labia. There is no rash or lesion on the left labia. Vagina exhibits  no lesion. No bleeding in the vagina. Right adnexum does not display mass and does not display tenderness. Left adnexum does not display mass and does not display tenderness. Cervix does not exhibit motion tenderness, lesion, friability or polyp.   Uterus is mobile and midaxial. Uterus is not enlarged or exhibiting a mass.  HENT:  Head: Normocephalic and atraumatic. Head is without laceration.  Right Ear: Hearing normal.  Left Ear: Hearing normal.  Nose: No epistaxis.  No foreign bodies.  Mouth/Throat: Uvula is midline, oropharynx is clear and moist and mucous membranes are normal.  Eyes: Pupils are equal, round, and reactive to light.  Neck: Normal range of motion. Neck supple. No thyromegaly present.  Cardiovascular: Normal rate and regular rhythm. Exam reveals no gallop and no friction rub.  No murmur heard. Pulmonary/Chest: Effort normal and breath sounds normal. No respiratory distress. She has no wheezes. Right breast exhibits no mass, no skin change and no tenderness. Left breast exhibits no mass, no skin change and no tenderness.  Abdominal: Soft. Bowel sounds are normal. She exhibits no distension. There is no tenderness. There is no rebound.  Musculoskeletal: Normal range of motion.  Neurological: She is alert and oriented to person, place, and time. No cranial nerve deficit.  Skin: Skin is warm and dry.  Psychiatric: She has a normal mood and affect. Judgment normal.  Vitals reviewed.  Assessment:  ANNUAL EXAM 1. Annual physical exam   2. Screening for breast cancer   3. Screening for cervical cancer   4. Sensation of pressure in bladder area   5. Overactive bladder    Screening Plan:            1.  Cervical Screening-  Pap smear done today  2. Breast screening- Exam annually and mammogram>40 planned   3. Colonoscopy every 10 years, Hemoccult testing - after age 20  4. Labs managed by PCP  5. Counseling for contraception: abstinence  Will stop Camila as has no other  indication for hormone therapy.  Monitor for cycles.   6. Sensation of pressure in bladder area [place] to assess uterine size and position.  7. Overactive bladder  8. Gardasil per her choice today, f/u 2 and  6 mos    F/U  Return in about 2 weeks (around 08/02/2018) for Follow up w [place].  Barnett Applebaum, MD, [facility], [facility] 07/19/2018  2:22 PM

## 2018-07-19 NOTE — Addendum Note (Signed)
Addended by: Quintella Baton D on: 07/19/2018 02:52 PM   Modules accepted: Orders

## 2018-07-21 ENCOUNTER — Other Ambulatory Visit (INDEPENDENT_AMBULATORY_CARE_PROVIDER_SITE_OTHER): Payer: Self-pay | Admitting: Pediatrics

## 2018-07-21 DIAGNOSIS — Q796 Ehlers-Danlos syndrome, unspecified: Secondary | ICD-10-CM

## 2018-07-21 DIAGNOSIS — M797 Fibromyalgia: Secondary | ICD-10-CM

## 2018-07-23 ENCOUNTER — Telehealth: Payer: Self-pay | Admitting: Internal Medicine

## 2018-07-23 ENCOUNTER — Other Ambulatory Visit: Payer: Self-pay | Admitting: Family Medicine

## 2018-07-23 LAB — CYTOLOGY - PAP
Diagnosis: NEGATIVE
HPV: NOT DETECTED

## 2018-07-23 MED ORDER — FENTANYL 75 MCG/HR TD PT72: 75.0000 ug | MEDICATED_PATCH | TRANSDERMAL | 0 refills | Status: DC

## 2018-07-23 MED ORDER — FENTANYL 12 MCG/HR TD PT72: 12.5000 ug | MEDICATED_PATCH | TRANSDERMAL | 0 refills | Status: DC

## 2018-07-23 NOTE — Telephone Encounter (Signed)
[phone] to [place] [address] [facility] [address]; for Adderall XR

## 2018-07-23 NOTE — Telephone Encounter (Signed)
[facility] pt[facility] [address]   Thanks,   -Mickel Baas

## 2018-07-23 NOTE — Telephone Encounter (Signed)
We received a fax from the after hours service stating that the patient has enough of the following medications to last until Monday and needs refills.  fentaNYL (DURAGESIC - DOSED MCG/HR) 12 MCG/HR   fentaNYL (DURAGESIC - DOSED MCG/HR) 75 MCG/HR   The fax did not state which pharmacy

## 2018-07-23 NOTE — Telephone Encounter (Signed)
Called pt and spoke with her mother Karen [name]. Per Karen [name], pt is needing a refill of the adderall 15mg . Pt has been sleeping too much due to being out of the medication. Per Karen [name], they have been trying to call our office and a message had been sent to the answering service. Karen [name] stated to me that pt has been out of this med for 10 days now and are wanting to know if they can get the med refilled.  I saw another encounter from 12/3 where they had called requesting a refill of the med and per Karen [name] on 07/18/18, a detailed message was left for pt to let them know that the med was ready for pickup. Called Karen [name] back to let her know this information. Karen [name] expressed understanding and stated someone would be by office probably sometime tomorrow 12/10 to pick up Rx.nothing further needed.

## 2018-07-31 ENCOUNTER — Telehealth: Payer: Self-pay | Admitting: Family Medicine

## 2018-07-31 NOTE — Telephone Encounter (Signed)
I left a message asking the pt to call and schedule AWV-I w/ [facility]. VDM (DD)

## 2018-08-03 ENCOUNTER — Other Ambulatory Visit (INDEPENDENT_AMBULATORY_CARE_PROVIDER_SITE_OTHER): Payer: Self-pay | Admitting: Pediatrics

## 2018-08-03 DIAGNOSIS — G2569 Other tics of organic origin: Secondary | ICD-10-CM

## 2018-08-06 ENCOUNTER — Other Ambulatory Visit: Payer: Medicare Other

## 2018-08-06 ENCOUNTER — Ambulatory Visit: Payer: Medicare Other | Admitting: Obstetrics & Gynecology

## 2018-08-10 ENCOUNTER — Encounter: Payer: Self-pay | Admitting: Family Medicine

## 2018-08-10 ENCOUNTER — Ambulatory Visit (INDEPENDENT_AMBULATORY_CARE_PROVIDER_SITE_OTHER): Payer: Medicare Other | Admitting: Family Medicine

## 2018-08-10 ENCOUNTER — Other Ambulatory Visit: Payer: Self-pay

## 2018-08-10 VITALS — BP 110/82 | HR 92 | Temp 98.1°F | Ht 59.5 in | Wt 145.0 lb

## 2018-08-10 DIAGNOSIS — J40 Bronchitis, not specified as acute or chronic: Secondary | ICD-10-CM | POA: Diagnosis not present

## 2018-08-10 DIAGNOSIS — J019 Acute sinusitis, unspecified: Secondary | ICD-10-CM

## 2018-08-10 DIAGNOSIS — J4531 Mild persistent asthma with (acute) exacerbation: Secondary | ICD-10-CM

## 2018-08-10 MED ORDER — ALBUTEROL SULFATE HFA 108 (90 BASE) MCG/ACT IN AERS: 2.0000 | INHALATION_SPRAY | Freq: Four times a day (QID) | RESPIRATORY_TRACT | 5 refills | Status: DC | PRN

## 2018-08-10 MED ORDER — AZITHROMYCIN 250 MG PO TABS: ORAL_TABLET | ORAL | 0 refills | Status: AC

## 2018-08-10 MED ORDER — PREDNISONE 20 MG PO TABS: 20.0000 mg | ORAL_TABLET | Freq: Two times a day (BID) | ORAL | 0 refills | Status: AC

## 2018-08-10 NOTE — Patient Instructions (Signed)
.   Please bring all of your medications to every appointment so we can make sure that our medication list is the same as yours.   

## 2018-08-10 NOTE — Progress Notes (Signed)
Patient: Karen [name] Female    DOB: [birth year]   42 y.o.   MRN: [id number] Visit Date: 08/10/2018  Today's Provider: Lelon Huh, MD   No chief complaint on file.  Subjective:    HPI  She states sx began with asthma attack 10 days ago after playing in leaves with her nephew, since then has developed worsening sinus congestions, drainage, coughing and wheezing. No fevers. Has been using Symbicort inhaler, fluticasone nasal spray, and oral decongestants, but is out of albuterol inhaler. Sx are getting worse, and is now coughing up yellow mucous.   Allergies  Allergen Reactions  . Chocolate Flavor     Other reaction(s): Other (See Comments) Wheezing, acne  . Demerol [Meperidine] Other (See Comments)    Slow to wake up when this drug is given.   Marland Kitchen Keflex [Cephalexin] Nausea And Vomiting  . Morphine And Related Nausea And Vomiting  . Toradol [Ketorolac Tromethamine] Nausea And Vomiting  . Augmentin  [Amoxicillin-Pot Clavulanate]   . Chocolate     GI distress  . Nsaids     patient develops ulcers  . Strawberry Extract     GI distress  . Tape Dermatitis     Current Outpatient Medications:  .  ADVAIR DISKUS 250-50 MCG/DOSE AEPB, INHALE 1 PUFF TWICE A DAY, Disp: 60 each, Rfl: 6 .  albuterol (VENTOLIN HFA) 108 (90 Base) MCG/ACT inhaler, Inhale 2 puffs into the lungs every 6 (six) hours as needed., Disp: 18 Inhaler, Rfl: 5 .  amphetamine-dextroamphetamine (ADDERALL XR) 15 MG 24 hr capsule, Take 1 capsule by mouth every morning., Disp: 31 capsule, Rfl: 0 .  budesonide-formoterol (SYMBICORT) 160-4.5 MCG/ACT inhaler, Inhale 2 puffs into the lungs 2 (two) times daily., Disp: 2 Inhaler, Rfl: 0 .  busPIRone (BUSPAR) 10 MG tablet, Take 1 tablet (10 mg total) by mouth 2 (two) times daily., Disp: 60 tablet, Rfl: 5 .  celecoxib (CELEBREX) 200 MG capsule, TAKE 1 CAPSULE BY MOUTH 2 TIMES A DAY, Disp: 60 capsule, Rfl: 11 .  CIPRODEX OTIC suspension, APPLY FOUR DROPS TO AFFECTED EAR  TWICE DAILY FOR 7 DAYS, Disp: , Rfl: 10 .  DEXILANT 30 MG capsule, TAKE ONE CAPSULE BY MOUTH DAILY, Disp: 30 capsule, Rfl: 5 .  diclofenac sodium (VOLTAREN) 1 % GEL, APPLY AS NEEDED 3 TIMES A DAY, Disp: 100 g, Rfl: 3 .  DULoxetine (CYMBALTA) 60 MG capsule, TAKE 2 CAPSULES (120 MG TOTAL) BY MOUTH DAILY., Disp: 60 capsule, Rfl: 11 .  fentaNYL (DURAGESIC - DOSED MCG/HR) 12 MCG/HR, Place 1 patch (12.5 mcg total) onto the skin every 3 (three) days., Disp: 10 patch, Rfl: 0 .  fentaNYL (DURAGESIC - DOSED MCG/HR) 75 MCG/HR, Place 1 patch (75 mcg total) onto the skin every 3 (three) days., Disp: 10 patch, Rfl: 0 .  fexofenadine (ALLEGRA) 180 MG tablet, Take 180 mg by mouth daily., Disp: , Rfl:  .  fluticasone (FLONASE) 50 MCG/ACT nasal spray, SPRAY 2 SPRAYS INTO EACH NOSTRIL EVERY DAY, Disp: 16 g, Rfl: 2 .  gabapentin (NEURONTIN) 600 MG tablet, TAKE 2 TABLETS BY MOUTH EVERY MORNING AND 3 TABLETS AT BEDTIME., Disp: 155 tablet, Rfl: 1 .  haloperidol (HALDOL) 0.5 MG tablet, TAKE 0.5 TABLETS (0.25 MG TOTAL) 2 (TWO) TIMES DAILY BY MOUTH., Disp: 31 tablet, Rfl: 0 .  haloperidol (HALDOL) 0.5 MG tablet, TAKE 0.5 TABLETS (0.25 MG TOTAL) BY MOUTH 2 (TWO) TIMES DAILY., Disp: 31 tablet, Rfl: 0 .  loratadine (CLARITIN) 10 MG tablet,  Take 10 mg by mouth daily., Disp: , Rfl:  .  ondansetron (ZOFRAN-ODT) 4 MG disintegrating tablet, DISSOLVE 1 TABLET IN MOUTH EVERY 8 HOURS FOR NAUSEA AND VOMITING, Disp: 30 tablet, Rfl: 5 .  pregabalin (LYRICA) 300 MG capsule, Take 1 capsule (300 mg total) by mouth 2 (two) times daily., Disp: 180 capsule, Rfl: 2 .  QUEtiapine (SEROQUEL) 200 MG tablet, TAKE 1 TABLET (200 MG TOTAL) BY MOUTH AT BEDTIME., Disp: 30 tablet, Rfl: 11 .  sulfamethoxazole-trimethoprim (BACTRIM DS,SEPTRA DS) 800-160 MG tablet, Take 1 tablet by mouth 2 (two) times daily., Disp: 6 tablet, Rfl: 0 .  tiZANidine (ZANAFLEX) 4 MG tablet, TAKE 1/2 TO 1 TABLET BY MOUTH EVERY EIGHT HOURS**NEEDS OFFICE VISIT**, Disp: 90 tablet,  Rfl: 4  Review of Systems  Constitutional: Negative for appetite change, chills, fatigue and fever.  HENT: Positive for congestion, dental problem, postnasal drip, rhinorrhea and sinus pressure. Negative for ear pain and sore throat.   Respiratory: Positive for cough, shortness of breath and wheezing. Negative for chest tightness.   Cardiovascular: Negative for chest pain and palpitations.  Gastrointestinal: Negative for abdominal pain, nausea and vomiting.  Neurological: Negative for dizziness and weakness.    Social History   Tobacco Use  . Smoking status: Never Smoker  . Smokeless tobacco: Never Used  Substance Use Topics  . Alcohol use: No    Alcohol/week: 0.0 standard drinks      Objective:     BP 110/82 (BP Location: Right Arm, Patient Position: Sitting, Cuff Size: Normal)   Pulse 92   Temp 98.1 F (36.7 C) (Oral)   Ht 4' 11.5" (1.511 m)   Wt 145 lb (65.8 kg)    SpO2 98%   BMI 28.80 kg/m   BSA 1.66 m   Physical Exam  General Appearance:    Alert, cooperative, no distress  HENT:   neck without nodes, sinuses nontender and nasal mucosa congested  Eyes:    PERRL, conjunctiva/corneas clear, EOM's intact       Lungs:     Occasional expiratory wheeze, no rales,  respirations unlabored  Heart:    Regular rate and rhythm  Neurologic:   Awake, alert, oriented x 3. No apparent focal neurological           defect.         Assessment & Plan      1. Mild persistent asthma with exacerbation  - albuterol (VENTOLIN HFA) 108 (90 Base) MCG/ACT inhaler; Inhale 2 puffs into the lungs every 6 (six) hours as needed.  Dispense: 18 Inhaler; Refill: 5 - predniSONE (DELTASONE) 20 MG tablet; Take 1 tablet (20 mg total) by mouth 2 (two) times daily with a meal for 5 days.  Dispense: 10 tablet; Refill: 0  2. Bronchitis  - azithromycin (ZITHROMAX) 250 MG tablet; 2 by mouth today, then 1 daily for 4 days  Dispense: 6 tablet; Refill: 0  3. Acute non-recurrent sinusitis, unspecified  location  - azithromycin (ZITHROMAX) 250 MG tablet; 2 by mouth today, then 1 daily for 4 days  Dispense: 6 tablet; Refill: 0 Call if symptoms change or if not rapidly improving.        Lelon Huh, MD  [facility]

## 2018-08-13 NOTE — Progress Notes (Signed)
Patient switched to Dr. Maralyn Sago schedule

## 2018-08-18 ENCOUNTER — Other Ambulatory Visit: Payer: Self-pay | Admitting: Family Medicine

## 2018-08-19 ENCOUNTER — Other Ambulatory Visit: Payer: Self-pay | Admitting: Family Medicine

## 2018-08-21 ENCOUNTER — Telehealth: Payer: Self-pay | Admitting: Internal Medicine

## 2018-08-21 ENCOUNTER — Other Ambulatory Visit: Payer: Self-pay

## 2018-08-21 MED ORDER — FENTANYL 75 MCG/HR TD PT72: 75.0000 ug | MEDICATED_PATCH | TRANSDERMAL | 0 refills | Status: DC

## 2018-08-21 MED ORDER — AMPHETAMINE-DEXTROAMPHET ER 15 MG PO CP24: 15.0000 mg | ORAL_CAPSULE | ORAL | 0 refills | Status: DC

## 2018-08-21 MED ORDER — FENTANYL 12 MCG/HR TD PT72: 12.5000 ug | MEDICATED_PATCH | TRANSDERMAL | 0 refills | Status: DC

## 2018-08-21 NOTE — Telephone Encounter (Signed)
Done escript 

## 2018-08-21 NOTE — Telephone Encounter (Signed)
Called and spoke with patient.  Explained that Dr. Annamaria Boots e script her prescription.  Understanding stated. Nothing further at this time.

## 2018-08-21 NOTE — Telephone Encounter (Signed)
Patient called office requesting refill on [facility] [address]

## 2018-08-21 NOTE — Telephone Encounter (Signed)
Called and spoke with patient, she is requesting a refill of her adderall. Patient stated she last picked this up on 07/23/18 even though it was prescribed on 07/18/18. CY please advise, thank you.    Current Outpatient Medications on File Prior to Visit  Medication Sig Dispense Refill  . ADVAIR DISKUS 250-50 MCG/DOSE AEPB INHALE 1 PUFF TWICE A DAY 60 each 6  . albuterol (VENTOLIN HFA) 108 (90 Base) MCG/ACT inhaler Inhale 2 puffs into the lungs every 6 (six) hours as needed. 18 Inhaler 5  . amphetamine-dextroamphetamine (ADDERALL XR) 15 MG 24 hr capsule Take 1 capsule by mouth every morning. 31 capsule 0  . budesonide-formoterol (SYMBICORT) 160-4.5 MCG/ACT inhaler Inhale 2 puffs into the lungs 2 (two) times daily. 2 Inhaler 0  . busPIRone (BUSPAR) 10 MG tablet Take 1 tablet (10 mg total) by mouth 2 (two) times daily. 60 tablet 5  . celecoxib (CELEBREX) 200 MG capsule TAKE 1 CAPSULE BY MOUTH 2 TIMES A DAY 60 capsule 11  . CIPRODEX OTIC suspension APPLY FOUR DROPS TO AFFECTED EAR TWICE DAILY FOR 7 DAYS  10  . DEXILANT 30 MG capsule TAKE ONE CAPSULE BY MOUTH DAILY 30 capsule 5  . diclofenac sodium (VOLTAREN) 1 % GEL APPLY AS NEEDED 3 TIMES A DAY 100 g 3  . DULoxetine (CYMBALTA) 60 MG capsule TAKE 2 CAPSULES (120 MG TOTAL) BY MOUTH DAILY. 60 capsule 11  . fentaNYL (DURAGESIC - DOSED MCG/HR) 12 MCG/HR Place 1 patch (12.5 mcg total) onto the skin every 3 (three) days. 10 patch 0  . fentaNYL (DURAGESIC - DOSED MCG/HR) 75 MCG/HR Place 1 patch (75 mcg total) onto the skin every 3 (three) days. 10 patch 0  . fexofenadine (ALLEGRA) 180 MG tablet Take 180 mg by mouth daily.    . fluticasone (FLONASE) 50 MCG/ACT nasal spray SPRAY 2 SPRAYS INTO EACH NOSTRIL EVERY DAY 16 g 2  . gabapentin (NEURONTIN) 600 MG tablet TAKE 2 TABLETS BY MOUTH EVERY MORNING AND 3 TABLETS AT BEDTIME. 155 tablet 1  . haloperidol (HALDOL) 0.5 MG tablet TAKE 0.5 TABLETS (0.25 MG TOTAL) 2 (TWO) TIMES DAILY BY MOUTH. 31 tablet 0  .  haloperidol (HALDOL) 0.5 MG tablet TAKE 0.5 TABLETS (0.25 MG TOTAL) BY MOUTH 2 (TWO) TIMES DAILY. 31 tablet 0  . loratadine (CLARITIN) 10 MG tablet Take 10 mg by mouth daily.    . ondansetron (ZOFRAN-ODT) 4 MG disintegrating tablet DISSOLVE 1 TABLET IN MOUTH EVERY 8 HOURS FOR NAUSEA AND VOMITING 30 tablet 5  . pregabalin (LYRICA) 300 MG capsule Take 1 capsule (300 mg total) by mouth 2 (two) times daily. 180 capsule 2  . QUEtiapine (SEROQUEL) 200 MG tablet TAKE 1 TABLET (200 MG TOTAL) BY MOUTH AT BEDTIME. 30 tablet 11  . sulfamethoxazole-trimethoprim (BACTRIM DS,SEPTRA DS) 800-160 MG tablet Take 1 tablet by mouth 2 (two) times daily. 6 tablet 0  . tiZANidine (ZANAFLEX) 4 MG tablet TAKE 1/2 TO 1 TABLET BY MOUTH EVERY EIGHT HOURS**NEEDS OFFICE VISIT** 90 tablet 4   No current facility-administered medications on file prior to visit.   . Allergies  Allergen Reactions  . Chocolate Flavor     Other reaction(s): Other (See Comments) Wheezing, acne  . Demerol [Meperidine] Other (See Comments)    Slow to wake up when this drug is given.   Marland Kitchen Keflex [Cephalexin] Nausea And Vomiting  . Morphine And Related Nausea And Vomiting  . Toradol [Ketorolac Tromethamine] Nausea And Vomiting  . Augmentin  [Amoxicillin-Pot Clavulanate]   .  Chocolate     GI distress  . Nsaids     patient develops ulcers  . Strawberry Extract     GI distress  . Tape Dermatitis

## 2018-08-27 ENCOUNTER — Ambulatory Visit: Payer: Medicare Other | Admitting: Obstetrics & Gynecology

## 2018-08-27 ENCOUNTER — Ambulatory Visit: Payer: Medicare Other

## 2018-09-04 ENCOUNTER — Other Ambulatory Visit (INDEPENDENT_AMBULATORY_CARE_PROVIDER_SITE_OTHER): Payer: Self-pay | Admitting: Pediatrics

## 2018-09-04 DIAGNOSIS — G2569 Other tics of organic origin: Secondary | ICD-10-CM

## 2018-09-05 ENCOUNTER — Telehealth (INDEPENDENT_AMBULATORY_CARE_PROVIDER_SITE_OTHER): Payer: Self-pay | Admitting: Pediatrics

## 2018-09-05 NOTE — Telephone Encounter (Signed)
L/M informing patient that the refill was sent in yesterday for her Haldol. Requested she call the pharmacy to check with them and then give me a call back

## 2018-09-05 NOTE — Telephone Encounter (Signed)
°  Who's calling (name and relationship to patient) : Tearah Saulsbury contact number: [phone]  Provider they see: Gaynell Face   Reason for call: Patient out of haldol completely     [place]  Name of prescription: Haldol .5 MG  Pharmacy: [facility]

## 2018-09-18 ENCOUNTER — Other Ambulatory Visit: Payer: Self-pay | Admitting: Family Medicine

## 2018-09-21 ENCOUNTER — Other Ambulatory Visit: Payer: Self-pay | Admitting: Family Medicine

## 2018-09-21 MED ORDER — FENTANYL 12 MCG/HR TD PT72: 1.0000 | MEDICATED_PATCH | TRANSDERMAL | 0 refills | Status: DC

## 2018-09-21 MED ORDER — FENTANYL 75 MCG/HR TD PT72: 1.0000 | MEDICATED_PATCH | TRANSDERMAL | 0 refills | Status: DC

## 2018-09-21 NOTE — Telephone Encounter (Signed)
°  Pt needing refills on: fentaNYL (DURAGESIC - DOSED MCG/HR) 12 MCG/HR fentaNYL (DURAGESIC - DOSED MCG/HR) 75 MCG/HR  [facility]  [id number] [name], [place] [phone] (Phone) [phone] (Fax)   Thanks, [employer]

## 2018-09-24 ENCOUNTER — Other Ambulatory Visit (INDEPENDENT_AMBULATORY_CARE_PROVIDER_SITE_OTHER): Payer: Self-pay | Admitting: Pediatrics

## 2018-09-24 ENCOUNTER — Telehealth: Payer: Self-pay | Admitting: Internal Medicine

## 2018-09-24 DIAGNOSIS — M797 Fibromyalgia: Secondary | ICD-10-CM

## 2018-09-24 DIAGNOSIS — Q796 Ehlers-Danlos syndrome, unspecified: Secondary | ICD-10-CM

## 2018-09-24 NOTE — Telephone Encounter (Signed)
LMTCB Requesting refill of Adderall XR 15mg . Informed patient on voicemail this will be addressed at next business day as it is late in the afternoon.   Last filled 08/21/18 one capsule po every morning #31 no refills.  Last OV 03/29/2018  CY please advise.

## 2018-09-25 MED ORDER — AMPHETAMINE-DEXTROAMPHET ER 15 MG PO CP24: 15.0000 mg | ORAL_CAPSULE | ORAL | 0 refills | Status: DC

## 2018-09-25 NOTE — Telephone Encounter (Signed)
Called and left detailed message (DPR), that Dr Annamaria Boots sent prescription to requested pharmacy.  Nothing further at this time.

## 2018-09-25 NOTE — Telephone Encounter (Signed)
Adderall refill e-sent 

## 2018-09-30 ENCOUNTER — Encounter: Payer: Self-pay | Admitting: Internal Medicine

## 2018-09-30 ENCOUNTER — Other Ambulatory Visit (INDEPENDENT_AMBULATORY_CARE_PROVIDER_SITE_OTHER): Payer: Self-pay | Admitting: Pediatrics

## 2018-09-30 DIAGNOSIS — G2569 Other tics of organic origin: Secondary | ICD-10-CM

## 2018-10-01 ENCOUNTER — Ambulatory Visit (INDEPENDENT_AMBULATORY_CARE_PROVIDER_SITE_OTHER): Payer: Medicare Other | Admitting: Internal Medicine

## 2018-10-01 ENCOUNTER — Encounter: Payer: Self-pay | Admitting: Internal Medicine

## 2018-10-01 VITALS — BP 118/74 | HR 94 | Ht 59.5 in | Wt 153.8 lb

## 2018-10-01 DIAGNOSIS — Z23 Encounter for immunization: Secondary | ICD-10-CM | POA: Diagnosis not present

## 2018-10-01 DIAGNOSIS — J4531 Mild persistent asthma with (acute) exacerbation: Secondary | ICD-10-CM | POA: Diagnosis not present

## 2018-10-01 DIAGNOSIS — G4739 Other sleep apnea: Secondary | ICD-10-CM

## 2018-10-01 DIAGNOSIS — J4530 Mild persistent asthma, uncomplicated: Secondary | ICD-10-CM

## 2018-10-01 MED ORDER — ALBUTEROL SULFATE HFA 108 (90 BASE) MCG/ACT IN AERS: 2.0000 | INHALATION_SPRAY | Freq: Four times a day (QID) | RESPIRATORY_TRACT | 12 refills | Status: DC | PRN

## 2018-10-01 MED ORDER — BUDESONIDE-FORMOTEROL FUMARATE 160-4.5 MCG/ACT IN AERO: 2.0000 | INHALATION_SPRAY | Freq: Two times a day (BID) | RESPIRATORY_TRACT | 12 refills | Status: DC

## 2018-10-01 NOTE — Assessment & Plan Note (Signed)
She is quite satisfied with her BiPAP regimen.  We have contacted advanced (now called [employer]) to help get replacement for stretched out mask.  Adderall also helps and is used as directed. Plan-replace mask, continue BiPAP ST 24/13 with backup rate 16

## 2018-10-01 NOTE — Progress Notes (Signed)
HPI  female never smoker referred by Dr. Gaynell [name] for management of severe mixed sleep apnea (central predominant), chronic hypoxic and hypercapnic respiratory failure, complicated by Ehlers-Danlos syndrome, fibromyalgia, seasonal allergic rhinitis, asthma, degenerative disc disease, rheumatoid arthritis, question of past seizure disorder, Tics BiPAP ST titration sleep study 10/11/2017-  Titrated to 24/ 13, back up rate 16 with minimal O2 sat 87%. Residual CAI 13.7/ hr NPSG 08/20/17-severe Mixed Sleep apnea (Central predominant)-AHI 134.1./hour, desaturation to 80%, 38.9 minutes recorded <= 88%, body weight 140 pounds, Central Apnea Index 119.8/ hr Office Spirometry 09/29/17-mild restriction of exhaled volume.  Flow is normal for volume.  FVC 2.29/74%, FEV1 2.0/78%, ratio 2.87, FEF 25-75% 2.87/101%    ------------------------------------------------------------------------------------- 03/29/2018- 43 year old female never smoker referred by Dr. Gaynell [name] for management of severe mixed sleep apnea (central predominant), chronic hypoxic and hypercapnic respiratory failure, complicated by Ehlers-Danlos syndrome, fibromyalgia, seasonal allergic rhinitis, asthma, degenerative disc disease, rheumatoid arthritis, question of past seizure disorder, Tics BIPAP ST 24/13, back up rate 16/ [facility], continuing adderall xr 15, and refit mask We added Symbicort for trial for asthma, continuing albuterol hfa -----OSA: DME: [facility]. Pt wears BiPAP nightly and DL attached. Pt would like to discuss Haldol Rx.      Mother is here She had an appointment with her [facility] to refit mask but was unable to keep it.  I asked her to call them and reschedule.  She is taking her mask off in her sleep at times which contributes to low compliance scores. Compliance download 57% (mostly short nights) AHI 5.8/hour.  She feels she breathes okay with it-much better than without.  Wheezing and dyspnea have been better controlled  using a maintenance inhaler.  She now has Symbicort 160 with occasional use of rescue inhaler. Haldol helps with sleep at night but has been on prolonged backorder.  I asked her to contact Dr. Melanee Left office for help with this issue. Between Adderall and BiPAP she reports doing much better overall.   (N.B.---Our next step if BiPAP ST proves insufficient, would be to look at [facility], perhaps EPAP 6, minimum PS 5, maximum PS 12)  10/01/2018- 43 year old female never smoker referred by Dr. Gaynell [name] for management of severe mixed sleep apnea (central predominant), chronic hypoxic and hypercapnic respiratory failure, complicated by Ehlers-Danlos syndrome, fibromyalgia, seasonal allergic rhinitis, asthma, degenerative disc disease, rheumatoid arthritis, question of past seizure disorder, Tics BIPAP ST 24/13, back up rate 16/ [facility], continuing adderall xr 15, and refit mask Download 57% compliance (reflecting some short nights) AHI 2.2/hour We had added Symbicort for trial for asthma, continuing albuterol hfa -----Pt states her BIPAP mask is too loose and she called [facility] 2 months ago and still has not heard back in regards to receiving a new mask. Pt also states she is currently getting over bronchitis and has complaints of dry cough with clear mucus, has occ SOB from asthma and has some mild chest tightness. Advair 250/50 or Symbicort 160, Adderall XR 15 mg, albuterol HFA, Using rescue inhaler about once a day, and Symbicort as directed. Current regimen of BiPAP and Adderall has significantly improved her original complaints of daytime sleepiness.  Mother indicates approval.  ROS-see HPI  + = positive Constitutional:    weight loss, night sweats, fevers, chills, +fatigue, lassitude. HEENT:    headaches, difficulty swallowing, tooth/dental problems, sore throat,       sneezing, itching, ear ache, nasal congestion, post nasal drip, snoring CV:    chest pain, orthopnea, PND, swelling in  lower extremities, anasarca,                                                     dizziness, palpitations Resp:  + shortness of breath with exertion or at rest.                productive cough,   non-productive cough, coughing up of blood.              change in color of mucus.  +wheezing.   Skin:    rash or lesions. GI:  No-   heartburn, indigestion, abdominal pain, nausea, vomiting, diarrhea,                 change in bowel habits, loss of appetite GU: dysuria, change in color of urine, no urgency or frequency.   flank pain. MS:   joint pain, stiffness, decreased range of motion, back pain. Neuro-     + HPI Psych:  change in mood or affect.  depression or anxiety.   memory loss.  OBJ- Physical Exam General- Awake, Oriented, Affect-appropriate, Distress- none acute,                 +Eyelid droop/ flaccid [name] make her look drowsy. +Power chair.  Skin- rash-none, lesions- none, excoriation- none Lymphadenopathy- none Head- atraumatic            Eyes- Gross vision intact, PERRLA, conjunctivae and secretions clear            Ears- Hearing, canals-normal            Nose- + mild turbinate edemea, +Septal dev, mucus, polyps, erosion, perforation             Throat- Mallampati III , mucosa clear , drainage- none, tonsils- atrophic Neck- flexible , trachea midline, no stridor , thyroid nl, carotid no bruit Chest - symmetrical excursion , unlabored           Heart/CV- RRR , no murmur heard , no gallop  , no rub, nl s1 s2                           - JVD- none , edema- none, stasis changes- none, varices- none           Lung- clear to P&A, wheeze- none, cough+ mild, dullness-none, rub- none           Chest wall-  Abd-  Br/ Gen/ Rectal- Not done, not indicated Extrem- cyanosis- none, clubbing, none, atrophy- none, strength- nl Neuro-+ very little muscle movement, slight [name], but mentation seems appropriate.

## 2018-10-01 NOTE — Assessment & Plan Note (Signed)
Recent mild exacerbation after being around family members and outdoors in "leaves". Plan-flu vaccine as requested, refill inhalers.

## 2018-10-01 NOTE — Patient Instructions (Signed)
Refills sent for Ventolin HFA and Symbicort 160  Call for adderall refill when needed  Order- [facility]- please replace mask of choice now, and supplies. Continue BIPAP ST 24/13, back up rate 16  Order Flu vax standard  Please call if we can help

## 2018-10-03 ENCOUNTER — Telehealth: Payer: Self-pay | Admitting: Family Medicine

## 2018-10-03 NOTE — Telephone Encounter (Signed)
Pt states she received a letter stating she will need prior auth for fentaNYL (DURAGESIC) 75 MCG/HR or fentaNYL (DURAGESIC) 12 MCG/HR.    Pt requesting call back to speak to someone regarding this.

## 2018-10-05 ENCOUNTER — Telehealth: Payer: Self-pay

## 2018-10-05 NOTE — Telephone Encounter (Signed)
Pt called back stating it is urgent for someone to call her back about her fentanyl patches.  They sent her a letter advising they need a prior Auth done.  Can someone please give her a call back.

## 2018-10-05 NOTE — Telephone Encounter (Signed)
Spoke with the [facility]ist at [facility].  He states pt does not need a PA at this time.  Pt already picked up RX 09/22/2018 and it is too early to process a refill.   LMTCB 10/05/2018  Thanks,   -Mickel Baas

## 2018-10-09 NOTE — Telephone Encounter (Signed)
Patient called back and I advised her as below. Patient states that this isnt correct. Patient says that the letter she received is not for a Prior authorization. The letter she received from her insurance was informing her that the Fentanyl patches are not on the formulary and need to be changed to something else. Patient stated that the initial message that she called about for this telephone encounter wasn't correct. Patients mom got on the phone upset because this hasn't been done yet. I requested that she drop a copy of the letter off so that we could read what the insurance needed [place] to do. Patients mom agreed to bring a copy of the letter to the office.

## 2018-10-10 NOTE — Telephone Encounter (Signed)
Prior authorization completed through covermymeds. Both prescriptions have been approved. Patients mom advised.

## 2018-10-10 NOTE — Telephone Encounter (Signed)
I advised patient's mom to have the pharmacy contact us if they have any problems when she goes to get the next refill.

## 2018-10-22 ENCOUNTER — Other Ambulatory Visit: Payer: Self-pay | Admitting: Family Medicine

## 2018-10-22 MED ORDER — FENTANYL 12 MCG/HR TD PT72: 1.0000 | MEDICATED_PATCH | TRANSDERMAL | 0 refills | Status: DC

## 2018-10-22 MED ORDER — FENTANYL 75 MCG/HR TD PT72: 1.0000 | MEDICATED_PATCH | TRANSDERMAL | 0 refills | Status: DC

## 2018-10-22 NOTE — Telephone Encounter (Signed)
Pt needing refills on: fentaNYL (DURAGESIC) 12 MCG/HR fentaNYL (DURAGESIC) 75 MCG/HR   Please fill at:  [id number] [name], [place] [phone] (Phone) [phone] (Fax)   Thanks, [employer]

## 2018-10-29 ENCOUNTER — Telehealth: Payer: Self-pay | Admitting: Internal Medicine

## 2018-10-29 MED ORDER — AMPHETAMINE-DEXTROAMPHET ER 15 MG PO CP24: 15.0000 mg | ORAL_CAPSULE | ORAL | 0 refills | Status: DC

## 2018-10-29 NOTE — Telephone Encounter (Signed)
Called and spoke with Patient.  Patient is requesting a refill for her generic Adderall XR, 15mg , take 1 daily.  Last refill was 09/25/18, #31, no refills.  [facility] [place]  Message routed to Dr Annamaria Boots  Allergies  Allergen Reactions  . Chocolate Flavor     Other reaction(s): Other (See Comments) Wheezing, acne  . Demerol [Meperidine] Other (See Comments)    Slow to wake up when this drug is given.   Marland Kitchen Keflex [Cephalexin] Nausea And Vomiting  . Morphine And Related Nausea And Vomiting  . Toradol [Ketorolac Tromethamine] Nausea And Vomiting  . Augmentin  [Amoxicillin-Pot Clavulanate]   . Chocolate     GI distress  . Nsaids     patient develops ulcers  . Strawberry Extract     GI distress  . Tape Dermatitis   Current Outpatient Medications on File Prior to Visit  Medication Sig Dispense Refill  . albuterol (VENTOLIN HFA) 108 (90 Base) MCG/ACT inhaler Inhale 2 puffs into the lungs every 6 (six) hours as needed. 1 Inhaler 12  . amphetamine-dextroamphetamine (ADDERALL XR) 15 MG 24 hr capsule Take 1 capsule by mouth every morning. 31 capsule 0  . budesonide-formoterol (SYMBICORT) 160-4.5 MCG/ACT inhaler Inhale 2 puffs into the lungs 2 (two) times daily. 1 Inhaler 12  . busPIRone (BUSPAR) 10 MG tablet Take 1 tablet (10 mg total) by mouth 2 (two) times daily. 60 tablet 5  . celecoxib (CELEBREX) 200 MG capsule TAKE 1 CAPSULE BY MOUTH 2 TIMES A DAY 60 capsule 11  . DEXILANT 30 MG capsule TAKE ONE CAPSULE BY MOUTH DAILY 30 capsule 11  . diclofenac sodium (VOLTAREN) 1 % GEL APPLY AS NEEDED 3 TIMES A DAY 100 g 3  . DULoxetine (CYMBALTA) 60 MG capsule TAKE 2 CAPSULES (120 MG TOTAL) BY MOUTH DAILY. 60 capsule 11  . fentaNYL (DURAGESIC) 12 MCG/HR Place 1 patch onto the skin every 3 (three) days. 10 patch 0  . fentaNYL (DURAGESIC) 75 MCG/HR Place 1 patch onto the skin every 3 (three) days. 10 patch 0  . fexofenadine (ALLEGRA) 180 MG tablet Take 180 mg by mouth daily.    .  fluticasone (FLONASE) 50 MCG/ACT nasal spray SPRAY 2 SPRAYS INTO EACH NOSTRIL EVERY DAY 16 g 2  . gabapentin (NEURONTIN) 600 MG tablet TAKE 2 TABLETS BY MOUTH EVERY MORNING AND 3 TABLETS AT BEDTIME. 155 tablet 1  . haloperidol (HALDOL) 0.5 MG tablet TAKE 0.5 TABLETS (0.25 MG TOTAL) BY MOUTH 2 (TWO) TIMES DAILY. 31 tablet 0  . loratadine (CLARITIN) 10 MG tablet Take 10 mg by mouth daily.    . ondansetron (ZOFRAN-ODT) 4 MG disintegrating tablet DISSOLVE 1 TABLET IN MOUTH EVERY 8 HOURS FOR NAUSEA AND VOMITING 30 tablet 5  . pregabalin (LYRICA) 300 MG capsule Take 1 capsule (300 mg total) by mouth 2 (two) times daily. 180 capsule 2  . QUEtiapine (SEROQUEL) 200 MG tablet TAKE 1 TABLET (200 MG TOTAL) BY MOUTH AT BEDTIME. 30 tablet 11  . tiZANidine (ZANAFLEX) 4 MG tablet TAKE 1/2 TO 1 TABLET BY MOUTH EVERY EIGHT HOURS**NEEDS OFFICE VISIT** 90 tablet 4   No current facility-administered medications on file prior to visit.

## 2018-10-29 NOTE — Telephone Encounter (Signed)
Adderall refill e-sent 

## 2018-10-30 NOTE — Telephone Encounter (Signed)
Called patient unable to reach LMTCB 

## 2018-11-05 ENCOUNTER — Other Ambulatory Visit (INDEPENDENT_AMBULATORY_CARE_PROVIDER_SITE_OTHER): Payer: Self-pay | Admitting: Pediatrics

## 2018-11-05 DIAGNOSIS — G2569 Other tics of organic origin: Secondary | ICD-10-CM

## 2018-11-06 ENCOUNTER — Encounter (INDEPENDENT_AMBULATORY_CARE_PROVIDER_SITE_OTHER): Payer: Self-pay | Admitting: Pediatrics

## 2018-11-06 ENCOUNTER — Other Ambulatory Visit: Payer: Self-pay

## 2018-11-06 ENCOUNTER — Ambulatory Visit (INDEPENDENT_AMBULATORY_CARE_PROVIDER_SITE_OTHER): Payer: Medicare Other | Admitting: Pediatrics

## 2018-11-06 VITALS — BP 108/80 | HR 80 | Ht 59.5 in | Wt 140.0 lb

## 2018-11-06 DIAGNOSIS — G43009 Migraine without aura, not intractable, without status migrainosus: Secondary | ICD-10-CM | POA: Diagnosis not present

## 2018-11-06 DIAGNOSIS — Q796 Ehlers-Danlos syndrome, unspecified: Secondary | ICD-10-CM | POA: Diagnosis not present

## 2018-11-06 DIAGNOSIS — G2569 Other tics of organic origin: Secondary | ICD-10-CM

## 2018-11-06 DIAGNOSIS — M797 Fibromyalgia: Secondary | ICD-10-CM | POA: Diagnosis not present

## 2018-11-06 DIAGNOSIS — G4739 Other sleep apnea: Secondary | ICD-10-CM | POA: Diagnosis not present

## 2018-11-06 MED ORDER — HALOPERIDOL 0.5 MG PO TABS: 0.2500 mg | ORAL_TABLET | Freq: Two times a day (BID) | ORAL | 5 refills | Status: DC

## 2018-11-06 MED ORDER — GABAPENTIN 600 MG PO TABS: ORAL_TABLET | ORAL | 5 refills | Status: DC

## 2018-11-06 NOTE — Patient Instructions (Addendum)
I am pleased that you are doing well.  We talked about the concerns of your insurer that you are on Lyrica and gabapentin.  In my opinion these are appropriate for your conditions and you are not having any trouble with them.  They are both in high doses.  I refilled prescriptions for haloperidol but also for the gabapentin because you had only 1 refill on that.  We will plan to see you in a year.  Contact us about 3 months before and we can almost certainly set you up for an appointment.  Please do not hesitate to call me if there is anything that I can do to help and I will try.
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
## 2018-11-20 ENCOUNTER — Other Ambulatory Visit: Payer: Self-pay

## 2018-11-20 MED ORDER — FENTANYL 12 MCG/HR TD PT72: 1.0000 | MEDICATED_PATCH | TRANSDERMAL | 0 refills | Status: DC

## 2018-11-20 MED ORDER — FENTANYL 75 MCG/HR TD PT72: 1.0000 | MEDICATED_PATCH | TRANSDERMAL | 0 refills | Status: DC

## 2018-12-07 ENCOUNTER — Other Ambulatory Visit: Payer: Self-pay | Admitting: Internal Medicine

## 2018-12-07 ENCOUNTER — Telehealth: Payer: Self-pay | Admitting: Internal Medicine

## 2018-12-07 MED ORDER — AMPHETAMINE-DEXTROAMPHET ER 15 MG PO CP24: 15.0000 mg | ORAL_CAPSULE | ORAL | 0 refills | Status: DC

## 2018-12-07 MED ORDER — FLUTICASONE-SALMETEROL 250-50 MCG/DOSE IN AEPB: 1.0000 | INHALATION_SPRAY | Freq: Two times a day (BID) | RESPIRATORY_TRACT | 11 refills | Status: DC

## 2018-12-07 NOTE — Telephone Encounter (Signed)
Patient notified by phone of refills. Nothing further needed.

## 2018-12-07 NOTE — Telephone Encounter (Signed)
Scripts e-went refilling Adderall and changing Symbicort to generic Advair

## 2018-12-07 NOTE — Telephone Encounter (Signed)
Called & spoke w/ pt. Pt requesting refill of Adderall. Last seen 10/01/2018 by CY. Adderall started 10/29/2018 31 capsule x0 refills.   Pt is also requesting to switch inhalers from Symbicort to Advair. States Symbicort is therapeutic, however, it "tastes horrible" to her. She proceeds to state that Advair has worked well for her in the past.  Preferred pharmacy: [facility] in [place].  Allergies  Allergen Reactions  . Chocolate Flavor     Other reaction(s): Other (See Comments) Wheezing, acne  . Demerol [Meperidine] Other (See Comments)    Slow to wake up when this drug is given.   Marland Kitchen Keflex [Cephalexin] Nausea And Vomiting  . Morphine And Related Nausea And Vomiting  . Toradol [Ketorolac Tromethamine] Nausea And Vomiting  . Augmentin  [Amoxicillin-Pot Clavulanate]   . Chocolate     GI distress  . Nsaids     patient develops ulcers  . Strawberry Extract     GI distress  . Tape Dermatitis   Current Outpatient Medications on File Prior to Visit  Medication Sig Dispense Refill  . albuterol (VENTOLIN HFA) 108 (90 Base) MCG/ACT inhaler Inhale 2 puffs into the lungs every 6 (six) hours as needed. 1 Inhaler 12  . amphetamine-dextroamphetamine (ADDERALL XR) 15 MG 24 hr capsule Take 1 capsule by mouth every morning. 31 capsule 0  . budesonide-formoterol (SYMBICORT) 160-4.5 MCG/ACT inhaler Inhale 2 puffs into the lungs 2 (two) times daily. 1 Inhaler 12  . busPIRone (BUSPAR) 10 MG tablet Take 1 tablet (10 mg total) by mouth 2 (two) times daily. 60 tablet 5  . celecoxib (CELEBREX) 200 MG capsule TAKE 1 CAPSULE BY MOUTH 2 TIMES A DAY 60 capsule 11  . DEXILANT 30 MG capsule TAKE ONE CAPSULE BY MOUTH DAILY 30 capsule 11  . diclofenac sodium (VOLTAREN) 1 % GEL APPLY AS NEEDED 3 TIMES A DAY 100 g 3  . DULoxetine (CYMBALTA) 60 MG capsule TAKE 2 CAPSULES (120 MG TOTAL) BY MOUTH DAILY. 60 capsule 11  . fentaNYL (DURAGESIC) 12 MCG/HR Place 1 patch onto the skin every 3 (three)  days. 10 patch 0  . fentaNYL (DURAGESIC) 75 MCG/HR Place 1 patch onto the skin every 3 (three) days. 10 patch 0  . fexofenadine (ALLEGRA) 180 MG tablet Take 180 mg by mouth daily.    . fluticasone (FLONASE) 50 MCG/ACT nasal spray SPRAY 2 SPRAYS INTO EACH NOSTRIL EVERY DAY 16 g 2  . gabapentin (NEURONTIN) 600 MG tablet Take 2 tablets in the morning and 3 tablets at nighttime 155 tablet 5  . haloperidol (HALDOL) 0.5 MG tablet Take 0.5 tablets (0.25 mg total) by mouth 2 (two) times daily. 31 tablet 5  . loratadine (CLARITIN) 10 MG tablet Take 10 mg by mouth daily.    . norethindrone (MICRONOR,CAMILA,ERRIN) 0.35 MG tablet Take 1 tablet by mouth daily.    . ondansetron (ZOFRAN-ODT) 4 MG disintegrating tablet DISSOLVE 1 TABLET IN MOUTH EVERY 8 HOURS FOR NAUSEA AND VOMITING 30 tablet 5  . pregabalin (LYRICA) 300 MG capsule Take 1 capsule (300 mg total) by mouth 2 (two) times daily. 180 capsule 2  . QUEtiapine (SEROQUEL) 200 MG tablet TAKE 1 TABLET (200 MG TOTAL) BY MOUTH AT BEDTIME. 30 tablet 11  . tiZANidine (ZANAFLEX) 4 MG tablet TAKE 1/2 TO 1 TABLET BY MOUTH EVERY EIGHT HOURS**NEEDS OFFICE VISIT** 90 tablet 4   No current facility-administered medications on file prior to visit.       CY, please advise on the  refill of this medication and your recommendations regarding the inhaler switch. Thank you.

## 2018-12-19 ENCOUNTER — Other Ambulatory Visit: Payer: Self-pay | Admitting: Family Medicine

## 2018-12-20 ENCOUNTER — Other Ambulatory Visit: Payer: Self-pay

## 2018-12-20 NOTE — Telephone Encounter (Signed)
Patient Is requesting a refill on Fentanyl 12 mcg and 75 mcg be sent to [facility].

## 2018-12-21 ENCOUNTER — Other Ambulatory Visit: Payer: Self-pay | Admitting: *Deleted

## 2018-12-21 MED ORDER — DICLOFENAC SODIUM 1 % TD GEL: TRANSDERMAL | 3 refills | Status: DC

## 2018-12-21 MED ORDER — FENTANYL 75 MCG/HR TD PT72: 1.0000 | MEDICATED_PATCH | TRANSDERMAL | 0 refills | Status: DC

## 2018-12-21 MED ORDER — FENTANYL 12 MCG/HR TD PT72: 1.0000 | MEDICATED_PATCH | TRANSDERMAL | 0 refills | Status: DC

## 2018-12-21 NOTE — Telephone Encounter (Signed)
Please review. Thanks!  

## 2019-01-14 ENCOUNTER — Telehealth: Payer: Self-pay | Admitting: Internal Medicine

## 2019-01-14 MED ORDER — AMPHETAMINE-DEXTROAMPHET ER 15 MG PO CP24: 15.0000 mg | ORAL_CAPSULE | ORAL | 0 refills | Status: DC

## 2019-01-14 NOTE — Telephone Encounter (Signed)
adderall refill script e-sent

## 2019-01-14 NOTE — Telephone Encounter (Signed)
Left detailed message for patient stating that we had received her Adderall XR 15mg  request and would send a message over to Dr. Annamaria Boots for her.    Last OV: 10/01/18 with Dr. Annamaria Boots Next OV: 03/18/19 with Dr. Annamaria Boots at [place]: 12/07/18 for 31 tablets   [facility] [place] [facility] [address]   Dr. Annamaria Boots, please advise if you are ok with this refill? Thanks!

## 2019-01-15 ENCOUNTER — Other Ambulatory Visit: Payer: Self-pay | Admitting: Family Medicine

## 2019-01-15 NOTE — Telephone Encounter (Signed)
Called and spoke with pt letting her know that CY sent refill of adderall to pharmacy and pt verbalized understanding. Nothing further needed.

## 2019-01-21 DIAGNOSIS — M545 Low back pain: Secondary | ICD-10-CM | POA: Diagnosis not present

## 2019-01-23 ENCOUNTER — Telehealth: Payer: Self-pay

## 2019-01-23 NOTE — Telephone Encounter (Signed)
Karen [name] @ [facility] [phone]. He states that he faxed some paperwork in earlier this week but has not received it back. He will refax it again this morning. He would like you to please call if you already have it.

## 2019-01-24 ENCOUNTER — Other Ambulatory Visit: Payer: Self-pay

## 2019-01-24 NOTE — Telephone Encounter (Signed)
Patient is requesting a refill on Fentanyl patches. 

## 2019-01-25 MED ORDER — FENTANYL 75 MCG/HR TD PT72: 1.0000 | MEDICATED_PATCH | TRANSDERMAL | 0 refills | Status: DC

## 2019-01-25 MED ORDER — FENTANYL 12 MCG/HR TD PT72: 1.0000 | MEDICATED_PATCH | TRANSDERMAL | 0 refills | Status: DC

## 2019-01-29 ENCOUNTER — Telehealth: Payer: Self-pay

## 2019-01-29 NOTE — Telephone Encounter (Signed)
ATC Nita, after several rings, call gets busy signal.  Per epic, Patient was changed to Advair from Symbicort 12/07/18, bu Dr Annamaria Boots. Symbicort is not on current med list.

## 2019-01-30 ENCOUNTER — Other Ambulatory Visit: Payer: Self-pay | Admitting: Family Medicine

## 2019-01-30 NOTE — Telephone Encounter (Signed)
[place], [place], [phone]. Rings 1 time, then goes busy x's 3.  Will try to contact Nita at a later time.

## 2019-01-31 NOTE — Telephone Encounter (Signed)
Per message from 12/07/2018, pt was switched from Symbicort to [place]. We do not have Symbicort listed on pt's current medication list as the change in inhalers was done. No further refills should be sent to pt's pharmacy of the Symbicort as we see that pt is not on the inhaler. Nothing further needed.

## 2019-02-05 ENCOUNTER — Other Ambulatory Visit: Payer: Self-pay | Admitting: Physical Medicine and Rehabilitation

## 2019-02-05 DIAGNOSIS — M545 Low back pain, unspecified: Secondary | ICD-10-CM

## 2019-02-22 ENCOUNTER — Other Ambulatory Visit: Payer: Self-pay | Admitting: Family Medicine

## 2019-02-22 MED ORDER — FENTANYL 75 MCG/HR TD PT72: 1.0000 | MEDICATED_PATCH | TRANSDERMAL | 0 refills | Status: DC

## 2019-02-22 MED ORDER — FENTANYL 12 MCG/HR TD PT72: 1.0000 | MEDICATED_PATCH | TRANSDERMAL | 0 refills | Status: DC

## 2019-02-22 NOTE — Telephone Encounter (Signed)
Pt needing refills on:  fentaNYL (DURAGESIC) 12 MCG/HR  fentaNYL (DURAGESIC) 75 MCG/HR  By Monday at:  [id number] [place], [place] [phone] (Phone) [phone] (Fax)     Thanks, [employer]

## 2019-02-27 ENCOUNTER — Other Ambulatory Visit: Payer: Self-pay | Admitting: Family Medicine

## 2019-03-01 ENCOUNTER — Other Ambulatory Visit: Payer: Self-pay

## 2019-03-01 ENCOUNTER — Ambulatory Visit
Admission: RE | Admit: 2019-03-01 | Discharge: 2019-03-01 | Disposition: A | Discharge status: Home / Self Care | Payer: BC Managed Care – PPO | Source: Ambulatory Visit | Attending: Physical Medicine and Rehabilitation | Admitting: Physical Medicine and Rehabilitation

## 2019-03-01 DIAGNOSIS — M5136 Other intervertebral disc degeneration, lumbar region: Secondary | ICD-10-CM | POA: Diagnosis not present

## 2019-03-01 DIAGNOSIS — M545 Low back pain, unspecified: Secondary | ICD-10-CM

## 2019-03-11 ENCOUNTER — Telehealth: Payer: Self-pay | Admitting: Internal Medicine

## 2019-03-11 MED ORDER — AMPHETAMINE-DEXTROAMPHET ER 15 MG PO CP24: 15.0000 mg | ORAL_CAPSULE | ORAL | 0 refills | Status: DC

## 2019-03-11 NOTE — Telephone Encounter (Signed)
Attempted to call pt but unable to reach. Left message for pt to return call. 

## 2019-03-11 NOTE — Telephone Encounter (Signed)
PT RETURNING CALL AND CAN BE REACHED @ [phone].Karen [name]

## 2019-03-11 NOTE — Telephone Encounter (Signed)
Adderall refill e-sent 

## 2019-03-11 NOTE — Telephone Encounter (Signed)
PT RETURNING CALL AND CAN BE REACHED @ [phone].[name]

## 2019-03-11 NOTE — Telephone Encounter (Signed)
Called and spoke with pt letting her know that CY refilled her adderall and pt verbalized understanding. Nothing further needed.

## 2019-03-11 NOTE — Telephone Encounter (Signed)
Pt is calling back [phone]

## 2019-03-11 NOTE — Telephone Encounter (Signed)
Called and spoke with pt. Pt is needing a refill on her adderall XR 15mg  24hr capsule. Med last filled for pt 01/14/2019 #31caps for pt to take 1cap every morning.  Dr. Annamaria Boots, please advise if you are okay refilling med for pt. Pharmacy [facility] to [facility] [facility] of [address].  Allergies  Allergen Reactions  . Chocolate Flavor     Other reaction(s): Other (See Comments) Wheezing, acne  . Demerol [Meperidine] Other (See Comments)    Slow to wake up when this drug is given.   Marland Kitchen Keflex [Cephalexin] Nausea And Vomiting  . Morphine And Related Nausea And Vomiting  . Toradol [Ketorolac Tromethamine] Nausea And Vomiting  . Augmentin  [Amoxicillin-Pot Clavulanate]   . Chocolate     GI distress  . Nsaids     patient develops ulcers  . Strawberry Extract     GI distress  . Tape Dermatitis     Current Outpatient Medications:  .  albuterol (VENTOLIN HFA) 108 (90 Base) MCG/ACT inhaler, Inhale 2 puffs into the lungs every 6 (six) hours as needed., Disp: 1 Inhaler, Rfl: 12 .  amphetamine-dextroamphetamine (ADDERALL XR) 15 MG 24 hr capsule, Take 1 capsule by mouth every morning., Disp: 31 capsule, Rfl: 0 .  busPIRone (BUSPAR) 10 MG tablet, TAKE 1 TABLET BY MOUTH TWICE A DAY, Disp: 60 tablet, Rfl: 11 .  celecoxib (CELEBREX) 200 MG capsule, TAKE 1 CAPSULE BY MOUTH 2 TIMES A DAY, Disp: 60 capsule, Rfl: 11 .  DEXILANT 30 MG capsule, TAKE ONE CAPSULE BY MOUTH DAILY, Disp: 30 capsule, Rfl: 11 .  diclofenac sodium (VOLTAREN) 1 % GEL, APPLY AS NEEDED 3 TIMES A DAY, Disp: 100 g, Rfl: 3 .  DULoxetine (CYMBALTA) 60 MG capsule, TAKE 2 CAPSULES (120 MG TOTAL) BY MOUTH DAILY., Disp: 60 capsule, Rfl: 11 .  fentaNYL (DURAGESIC) 12 MCG/HR, Place 1 patch onto the skin every 3 (three) days., Disp: 10 patch, Rfl: 0 .  fentaNYL (DURAGESIC) 75 MCG/HR, Place 1 patch onto the skin every 3 (three) days., Disp: 10 patch, Rfl: 0 .  fexofenadine (ALLEGRA) 180 MG tablet, Take 180 mg by mouth daily., Disp: ,  Rfl:  .  fluticasone (FLONASE) 50 MCG/ACT nasal spray, SPRAY 2 SPRAYS INTO EACH NOSTRIL EVERY DAY, Disp: 16 g, Rfl: 2 .  Fluticasone-Salmeterol (ADVAIR) 250-50 MCG/DOSE AEPB, Inhale 1 puff into the lungs every 12 (twelve) hours. Rinse mouth after use, Disp: 60 each, Rfl: 11 .  gabapentin (NEURONTIN) 600 MG tablet, Take 2 tablets in the morning and 3 tablets at nighttime, Disp: 155 tablet, Rfl: 5 .  haloperidol (HALDOL) 0.5 MG tablet, Take 0.5 tablets (0.25 mg total) by mouth 2 (two) times daily., Disp: 31 tablet, Rfl: 5 .  loratadine (CLARITIN) 10 MG tablet, Take 10 mg by mouth daily., Disp: , Rfl:  .  norethindrone (MICRONOR,CAMILA,ERRIN) 0.35 MG tablet, Take 1 tablet by mouth daily., Disp: , Rfl:  .  ondansetron (ZOFRAN-ODT) 4 MG disintegrating tablet, DISSOLVE 1 TABLET IN MOUTH EVERY 8 HOURS FOR NAUSEA AND VOMITING, Disp: 30 tablet, Rfl: 5 .  pregabalin (LYRICA) 300 MG capsule, TAKE 1 CAPSULE BY MOUTH TWICE A DAY, Disp: 180 capsule, Rfl: 4 .  QUEtiapine (SEROQUEL) 200 MG tablet, TAKE 1 TABLET (200 MG TOTAL) BY MOUTH AT BEDTIME., Disp: 30 tablet, Rfl: 11 .  tiZANidine (ZANAFLEX) 4 MG tablet, Take 0.5-1 tablets (2-4 mg total) by mouth every 8 (eight) hours as needed for muscle spasms., Disp: 90 tablet,  Rfl: 4

## 2019-03-11 NOTE — Telephone Encounter (Signed)
ATC pt, no answer. Left message for pt to call back.  

## 2019-03-18 ENCOUNTER — Other Ambulatory Visit: Payer: Self-pay

## 2019-03-18 ENCOUNTER — Ambulatory Visit (INDEPENDENT_AMBULATORY_CARE_PROVIDER_SITE_OTHER): Payer: Medicare Other | Admitting: Internal Medicine

## 2019-03-18 ENCOUNTER — Encounter: Payer: Self-pay | Admitting: Internal Medicine

## 2019-03-18 DIAGNOSIS — G4739 Other sleep apnea: Secondary | ICD-10-CM | POA: Diagnosis not present

## 2019-03-18 DIAGNOSIS — J4530 Mild persistent asthma, uncomplicated: Secondary | ICD-10-CM

## 2019-03-18 DIAGNOSIS — J9611 Chronic respiratory failure with hypoxia: Secondary | ICD-10-CM

## 2019-03-18 DIAGNOSIS — Q796 Ehlers-Danlos syndrome, unspecified: Secondary | ICD-10-CM | POA: Diagnosis not present

## 2019-03-18 DIAGNOSIS — J9612 Chronic respiratory failure with hypercapnia: Secondary | ICD-10-CM

## 2019-03-18 NOTE — Progress Notes (Signed)
HPI  female never smoker referred by Dr. Gaynell Face for management of severe mixed sleep apnea (central predominant), chronic hypoxic and hypercapnic respiratory failure, complicated by Ehlers-Danlos syndrome, fibromyalgia, seasonal allergic rhinitis, asthma, degenerative disc disease, rheumatoid arthritis, question of past seizure disorder, Tics BiPAP ST titration sleep study 10/11/2017-  Titrated to 24/ 13, back up rate 16 with minimal O2 sat 87%. Residual CAI 13.7/ hr NPSG 08/20/17-severe Mixed Sleep apnea (Central predominant)-AHI 134.1./hour, desaturation to 80%, 38.9 minutes recorded <= 88%, body weight 140 pounds, Central Apnea Index 119.8/ hr Office Spirometry 09/29/17-mild restriction of exhaled volume.  Flow is normal for volume.  FVC 2.29/74%, FEV1 2.0/78%, ratio 2.87, FEF 25-75% 2.87/101%    -------------------------------------------------------------------------------------    (N.B.---Our next step if BiPAP ST proves insufficient, would be to look at [facility], perhaps EPAP 6, minimum PS 5, maximum PS 12)  10/01/2018- 42 year old female never smoker referred by Dr. Gaynell Face for management of severe mixed sleep apnea (central predominant), chronic hypoxic and hypercapnic respiratory failure, complicated by Ehlers-Danlos syndrome, fibromyalgia, seasonal allergic rhinitis, asthma, degenerative disc disease, rheumatoid arthritis, question of past seizure disorder, Tics BIPAP ST 24/13, back up rate 16/ [facility], continuing adderall xr 15, and refit mask Download 57% compliance (reflecting some short nights) AHI 2.2/hour We had added Symbicort for trial for asthma, continuing albuterol hfa -----Pt states her BIPAP mask is too loose and she called [facility] 2 months ago and still has not heard back in regards to receiving a new mask. Pt also states she is currently getting over bronchitis and has complaints of dry cough with clear mucus, has occ SOB from asthma and has some mild chest  tightness. Advair 250/50 or Symbicort 160, Adderall XR 15 mg, albuterol HFA, Using rescue inhaler about once a day, and Symbicort as directed. Current regimen of BiPAP and Adderall has significantly improved her original complaints of daytime sleepiness.  Mother indicates approval.  03/18/2019- Virtual Visit via Telephone Note  I connected with Karen [name] on 03/18/19 at  3:00 PM EDT by telephone and verified that I am speaking with the correct person using two identifiers.  Location: Patient: Karen [name]   I discussed the limitations, risks, security and privacy concerns of performing an evaluation and management service by telephone and the availability of in person appointments. I also discussed with the patient that there may be a patient responsible charge related to this service. The patient expressed understanding and agreed to proceed.   History of Present Illness: 43 year old female never smoker referred by Dr. Gaynell Face for management of severe Mixed Sleep Apnea (central predominant), chronic hypoxic and hypercapnic Respiratory Failure, complicated by Ehlers-Danlos syndrome, fibromyalgia, seasonal allergic rhinitis, asthma, degenerative disc disease, rheumatoid arthritis, question of past seizure disorder, Tics BIPAP ST 24/13, back up rate 16/ [facility], continuing adderall xr 15, and refit mask  -----OSA on BiPAP 24/13, DME: [facility], no complaints, no new or worsening symptoms Adderall XR 15 mg works fine, once daily Advair 250 and albuterol rescue( 1x daily) also doing well.  She feels she is doing well with her Bipap Spontaneous Timed (AirCurve 10 ST) machine at current settings.  Breathing feels clear with Advair and albuterol hfa. No acute issues.  Observations/Objective: Download compliance 90%, AHI 3.8/ hr   Assessment and Plan: Mixed Sleep Apnea-  Doing well with ventilatory assist from BiPap ST- continue present settings. Chronic Resp Failure- Hypoxic and Hypercapneic  Supported by her BIPAP Asthma mild persistent uncomplicated- meds work well Ehlers-Danlos- managed by Neurology/ Dr  Hickling  Follow Up Instructions: 1 year   I discussed the assessment and treatment plan with the patient. The patient was provided an opportunity to ask questions and all were answered. The patient agreed with the plan and demonstrated an understanding of the instructions.   The patient was advised to call back or seek an in-person evaluation if the symptoms worsen or if the condition fails to improve as anticipated.  I provided 21 minutes of non-face-to-face time during this encounter.   Baird Lyons, MD    ROS-see HPI  + = positive Constitutional:    weight loss, night sweats, fevers, chills, +fatigue, lassitude. HEENT:    headaches, difficulty swallowing, tooth/dental problems, sore throat,       sneezing, itching, ear ache, nasal congestion, post nasal drip, snoring CV:    chest pain, orthopnea, PND, swelling in lower extremities, anasarca,                                                     dizziness, palpitations Resp:  + shortness of breath with exertion or at rest.                productive cough,   non-productive cough, coughing up of blood.              change in color of mucus.  +wheezing.   Skin:    rash or lesions. GI:  No-   heartburn, indigestion, abdominal pain, nausea, vomiting, diarrhea,                 change in bowel habits, loss of appetite GU: dysuria, change in color of urine, no urgency or frequency.   flank pain. MS:   joint pain, stiffness, decreased range of motion, back pain. Neuro-     + HPI Psych:  change in mood or affect.  depression or anxiety.   memory loss.  OBJ- Physical Exam General- Awake, Oriented, Affect-appropriate, Distress- none acute,                 +Eyelid droop/ flaccid face make her look drowsy. +Power chair.  Skin- rash-none, lesions- none, excoriation- none Lymphadenopathy- none Head- atraumatic            Eyes-  Gross vision intact, PERRLA, conjunctivae and secretions clear            Ears- Hearing, canals-normal            Nose- + mild turbinate edemea, +Septal dev, mucus, polyps, erosion, perforation             Throat- Mallampati III , mucosa clear , drainage- none, tonsils- atrophic Neck- flexible , trachea midline, no stridor , thyroid nl, carotid no bruit Chest - symmetrical excursion , unlabored           Heart/CV- RRR , no murmur heard , no gallop  , no rub, nl s1 s2                           - JVD- none , edema- none, stasis changes- none, varices- none           Lung- clear to P&A, wheeze- none, cough+ mild, dullness-none, rub- none           Chest wall-  Abd-  Br/ Gen/  Rectal- Not done, not indicated Extrem- cyanosis- none, clubbing, none, atrophy- none, strength- nl Neuro-+ very little muscle movement, slight face, but mentation seems appropriate.

## 2019-03-18 NOTE — Patient Instructions (Signed)
We can continue BIPAP ST 24/13 with back up rate 16, mask of choice, humidifier, supplies, AirView/ card  Ok to continue Advair 250. Albuterol rescue inhaler and Adderall XR. Please call for refills, and let us know if we can help.

## 2019-03-28 ENCOUNTER — Other Ambulatory Visit: Payer: Self-pay

## 2019-03-28 ENCOUNTER — Other Ambulatory Visit: Payer: Self-pay | Admitting: Family Medicine

## 2019-03-28 MED ORDER — FENTANYL 75 MCG/HR TD PT72: 1.0000 | MEDICATED_PATCH | TRANSDERMAL | 0 refills | Status: DC

## 2019-03-28 MED ORDER — FENTANYL 12 MCG/HR TD PT72: 1.0000 | MEDICATED_PATCH | TRANSDERMAL | 0 refills | Status: DC

## 2019-03-28 NOTE — Telephone Encounter (Signed)
Pt needs refills on   Fentanyl 75 MCG Fentanyl  12 MCG  [facility] [address]  [place]

## 2019-04-14 ENCOUNTER — Other Ambulatory Visit: Payer: Self-pay | Admitting: Family Medicine

## 2019-04-23 ENCOUNTER — Telehealth: Payer: Self-pay | Admitting: Internal Medicine

## 2019-04-23 MED ORDER — AMPHETAMINE-DEXTROAMPHET ER 15 MG PO CP24: 15.0000 mg | ORAL_CAPSULE | ORAL | 0 refills | Status: DC

## 2019-04-23 NOTE — Telephone Encounter (Signed)
Adderall refill e-sent 

## 2019-04-23 NOTE — Telephone Encounter (Addendum)
Pt calling to request refill for Adderall to go to [place]. Pt last seen 03/18/2019 via televisit w/ CY. Adderall last filled 03/11/2019 31 capsule x0 refills.   Current Outpatient Medications on File Prior to Visit  Medication Sig Dispense Refill  . albuterol (VENTOLIN HFA) 108 (90 Base) MCG/ACT inhaler Inhale 2 puffs into the lungs every 6 (six) hours as needed. 1 Inhaler 12  . amphetamine-dextroamphetamine (ADDERALL XR) 15 MG 24 hr capsule Take 1 capsule by mouth every morning. 31 capsule 0  . busPIRone (BUSPAR) 10 MG tablet TAKE 1 TABLET BY MOUTH TWICE A DAY 60 tablet 11  . celecoxib (CELEBREX) 200 MG capsule TAKE 1 CAPSULE BY MOUTH 2 TIMES A DAY 60 capsule 11  . DEXILANT 30 MG capsule TAKE ONE CAPSULE BY MOUTH DAILY 30 capsule 11  . diclofenac sodium (VOLTAREN) 1 % GEL APPLY AS NEEDED 3 TIMES A DAY 100 g 5  . DULoxetine (CYMBALTA) 60 MG capsule TAKE 2 CAPSULES (120 MG TOTAL) BY MOUTH DAILY. 60 capsule 11  . fentaNYL (DURAGESIC) 12 MCG/HR Place 1 patch onto the skin every 3 (three) days. 10 patch 0  . fentaNYL (DURAGESIC) 75 MCG/HR Place 1 patch onto the skin every 3 (three) days. 10 patch 0  . fexofenadine (ALLEGRA) 180 MG tablet Take 180 mg by mouth daily.    . fluticasone (FLONASE) 50 MCG/ACT nasal spray SPRAY 2 SPRAYS INTO EACH NOSTRIL EVERY DAY 16 g 2  . Fluticasone-Salmeterol (ADVAIR) 250-50 MCG/DOSE AEPB Inhale 1 puff into the lungs every 12 (twelve) hours. Rinse mouth after use 60 each 11  . gabapentin (NEURONTIN) 600 MG tablet Take 2 tablets in the morning and 3 tablets at nighttime 155 tablet 5  . haloperidol (HALDOL) 0.5 MG tablet Take 0.5 tablets (0.25 mg total) by mouth 2 (two) times daily. 31 tablet 5  . loratadine (CLARITIN) 10 MG tablet Take 10 mg by mouth daily.    . norethindrone (MICRONOR,CAMILA,ERRIN) 0.35 MG tablet Take 1 tablet by mouth daily.    . ondansetron (ZOFRAN-ODT) 4 MG disintegrating tablet DISSOLVE 1 TABLET IN MOUTH EVERY  8 HOURS FOR NAUSEA AND VOMITING 30 tablet 5  . pregabalin (LYRICA) 300 MG capsule TAKE 1 CAPSULE BY MOUTH TWICE A DAY 180 capsule 4  . QUEtiapine (SEROQUEL) 200 MG tablet TAKE 1 TABLET (200 MG TOTAL) BY MOUTH AT BEDTIME. 30 tablet 11  . tiZANidine (ZANAFLEX) 4 MG tablet Take 0.5-1 tablets (2-4 mg total) by mouth every 8 (eight) hours as needed for muscle spasms. 90 tablet 4   No current facility-administered medications on file prior to visit.    Allergies  Allergen Reactions  . Chocolate Flavor     Other reaction(s): Other (See Comments) Wheezing, acne  . Demerol [Meperidine] Other (See Comments)    Slow to wake up when this drug is given.   Marland Kitchen Keflex [Cephalexin] Nausea And Vomiting  . Morphine And Related Nausea And Vomiting  . Toradol [Ketorolac Tromethamine] Nausea And Vomiting  . Augmentin  [Amoxicillin-Pot Clavulanate]   . Chocolate     GI distress  . Nsaids     patient develops ulcers  . Strawberry Extract     GI distress  . Tape Dermatitis    CY, please advise on the refill of this medication. Thank you.

## 2019-04-23 NOTE — Telephone Encounter (Signed)
Called and spoke with pt regarding CY's refill of her Adderall. Pt verbalized understanding with no additional questions. Nothing further needed at this time.

## 2019-04-29 ENCOUNTER — Other Ambulatory Visit: Payer: Self-pay | Admitting: Family Medicine

## 2019-04-29 MED ORDER — FENTANYL 75 MCG/HR TD PT72: 1.0000 | MEDICATED_PATCH | TRANSDERMAL | 0 refills | Status: DC

## 2019-04-29 MED ORDER — FENTANYL 12 MCG/HR TD PT72: 1.0000 | MEDICATED_PATCH | TRANSDERMAL | 0 refills | Status: DC

## 2019-04-29 NOTE — Telephone Encounter (Signed)
pt needs refill on her   Fentanyl 12 mcg  Fentanyl 59mcg  [facility] [address]  [phone]  [place]

## 2019-04-30 ENCOUNTER — Other Ambulatory Visit (INDEPENDENT_AMBULATORY_CARE_PROVIDER_SITE_OTHER): Payer: Self-pay | Admitting: Pediatrics

## 2019-04-30 DIAGNOSIS — G2569 Other tics of organic origin: Secondary | ICD-10-CM

## 2019-05-01 ENCOUNTER — Telehealth (INDEPENDENT_AMBULATORY_CARE_PROVIDER_SITE_OTHER): Payer: Self-pay | Admitting: Pediatrics

## 2019-05-01 DIAGNOSIS — G2569 Other tics of organic origin: Secondary | ICD-10-CM

## 2019-05-01 MED ORDER — HALOPERIDOL 0.5 MG PO TABS: 0.2500 mg | ORAL_TABLET | Freq: Two times a day (BID) | ORAL | 5 refills | Status: DC

## 2019-05-01 NOTE — Telephone Encounter (Signed)
°  Who's calling (name and relationship to patient) : Dr. Gaynell Face Best contact number: [phone] Provider they see: Dr. Gaynell Face Reason for call: Pt requesting refill on Haloperidol.      PRESCRIPTION REFILL ONLY  Name of prescription: Haloperidol 0.5mg / 31 tabs [facility] [place] [place]

## 2019-05-01 NOTE — Telephone Encounter (Signed)
Prescription has been electronically sent.

## 2019-05-08 ENCOUNTER — Encounter: Payer: Self-pay | Admitting: Internal Medicine

## 2019-05-08 DIAGNOSIS — J9612 Chronic respiratory failure with hypercapnia: Secondary | ICD-10-CM | POA: Insufficient documentation

## 2019-05-08 DIAGNOSIS — J9611 Chronic respiratory failure with hypoxia: Secondary | ICD-10-CM | POA: Insufficient documentation

## 2019-05-08 NOTE — Assessment & Plan Note (Signed)
Primary problem is ventilatory weakness due to Ehlers-Danlos. BIPAP Spontaneous Timed (BIPAP ST) seems to be giving good support and she feels comfortable with this intervention.  Plan- no change

## 2019-05-08 NOTE — Assessment & Plan Note (Signed)
Maintenance Advair and using albuterol hfa once daily has been stable control for her, without exacerbation. Covid careful.

## 2019-05-08 NOTE — Assessment & Plan Note (Signed)
Underlying problem with significant weakness. Followed closely by Dr Gaynell Face.

## 2019-05-08 NOTE — Assessment & Plan Note (Signed)
Benefits from her BIPAP with good compliance and control Continue current settings

## 2019-05-16 DIAGNOSIS — Z23 Encounter for immunization: Secondary | ICD-10-CM | POA: Diagnosis not present

## 2019-05-23 ENCOUNTER — Other Ambulatory Visit: Payer: Self-pay | Admitting: Family Medicine

## 2019-05-26 ENCOUNTER — Other Ambulatory Visit (INDEPENDENT_AMBULATORY_CARE_PROVIDER_SITE_OTHER): Payer: Self-pay | Admitting: Pediatrics

## 2019-05-26 DIAGNOSIS — M797 Fibromyalgia: Secondary | ICD-10-CM

## 2019-05-26 DIAGNOSIS — Q796 Ehlers-Danlos syndrome, unspecified: Secondary | ICD-10-CM

## 2019-05-27 ENCOUNTER — Other Ambulatory Visit: Payer: Self-pay

## 2019-05-27 MED ORDER — FENTANYL 12 MCG/HR TD PT72: 1.0000 | MEDICATED_PATCH | TRANSDERMAL | 0 refills | Status: DC

## 2019-05-27 MED ORDER — FENTANYL 75 MCG/HR TD PT72: 1.0000 | MEDICATED_PATCH | TRANSDERMAL | 0 refills | Status: DC

## 2019-05-27 NOTE — Telephone Encounter (Signed)
Patient requesting refill request for the following medications fentaNYL (DURAGESIC) 12 MCG/HR  fentaNYL ([place]) 75 MCG/HR   Pharmacy: [facility]

## 2019-05-30 ENCOUNTER — Other Ambulatory Visit: Payer: Self-pay | Admitting: Family Medicine

## 2019-06-03 DIAGNOSIS — M79674 Pain in right toe(s): Secondary | ICD-10-CM | POA: Diagnosis not present

## 2019-06-03 DIAGNOSIS — M79675 Pain in left toe(s): Secondary | ICD-10-CM | POA: Diagnosis not present

## 2019-06-05 ENCOUNTER — Other Ambulatory Visit: Payer: Self-pay | Admitting: Orthopaedic Surgery

## 2019-06-06 ENCOUNTER — Encounter (HOSPITAL_BASED_OUTPATIENT_CLINIC_OR_DEPARTMENT_OTHER): Payer: Self-pay | Admitting: *Deleted

## 2019-06-07 ENCOUNTER — Other Ambulatory Visit: Payer: Self-pay | Admitting: Family Medicine

## 2019-06-10 ENCOUNTER — Other Ambulatory Visit: Payer: Self-pay

## 2019-06-10 ENCOUNTER — Other Ambulatory Visit (HOSPITAL_COMMUNITY)
Admission: RE | Admit: 2019-06-10 | Discharge: 2019-06-10 | Disposition: A | Discharge status: Home / Self Care | Payer: Medicare Other | Source: Ambulatory Visit | Attending: Orthopaedic Surgery | Admitting: Orthopaedic Surgery

## 2019-06-10 ENCOUNTER — Encounter (HOSPITAL_BASED_OUTPATIENT_CLINIC_OR_DEPARTMENT_OTHER): Payer: Self-pay | Admitting: *Deleted

## 2019-06-10 DIAGNOSIS — M791 Myalgia, unspecified site: Secondary | ICD-10-CM | POA: Diagnosis not present

## 2019-06-10 DIAGNOSIS — Z01812 Encounter for preprocedural laboratory examination: Secondary | ICD-10-CM | POA: Diagnosis not present

## 2019-06-10 DIAGNOSIS — Z20828 Contact with and (suspected) exposure to other viral communicable diseases: Secondary | ICD-10-CM | POA: Diagnosis not present

## 2019-06-10 DIAGNOSIS — M47816 Spondylosis without myelopathy or radiculopathy, lumbar region: Secondary | ICD-10-CM | POA: Diagnosis not present

## 2019-06-10 NOTE — Progress Notes (Signed)
Pt's pmh reviewed with Dr. Royce Macadamia. Floris for surgery at [facility] on 10/29.

## 2019-06-11 LAB — NOVEL CORONAVIRUS, NAA (HOSP ORDER, SEND-OUT TO REF LAB; TAT 18-24 HRS): SARS-CoV-2, NAA: NOT DETECTED

## 2019-06-13 ENCOUNTER — Other Ambulatory Visit: Payer: Self-pay

## 2019-06-13 ENCOUNTER — Ambulatory Visit (HOSPITAL_BASED_OUTPATIENT_CLINIC_OR_DEPARTMENT_OTHER): Payer: Medicare Other | Admitting: Anesthesiology

## 2019-06-13 ENCOUNTER — Encounter (HOSPITAL_BASED_OUTPATIENT_CLINIC_OR_DEPARTMENT_OTHER): Payer: Self-pay | Admitting: Anesthesiology

## 2019-06-13 ENCOUNTER — Ambulatory Visit (HOSPITAL_BASED_OUTPATIENT_CLINIC_OR_DEPARTMENT_OTHER)
Admission: RE | Admit: 2019-06-13 | Discharge: 2019-06-13 | Disposition: A | Discharge status: Home / Self Care | Payer: Medicare Other | Attending: Orthopaedic Surgery | Admitting: Orthopaedic Surgery

## 2019-06-13 ENCOUNTER — Encounter (HOSPITAL_BASED_OUTPATIENT_CLINIC_OR_DEPARTMENT_OTHER)
Admission: RE | Disposition: A | Discharge status: Home / Self Care | Payer: Self-pay | Source: Home / Self Care | Attending: Orthopaedic Surgery

## 2019-06-13 DIAGNOSIS — M797 Fibromyalgia: Secondary | ICD-10-CM | POA: Diagnosis not present

## 2019-06-13 DIAGNOSIS — Z840 Family history of diseases of the skin and subcutaneous tissue: Secondary | ICD-10-CM | POA: Diagnosis not present

## 2019-06-13 DIAGNOSIS — F329 Major depressive disorder, single episode, unspecified: Secondary | ICD-10-CM | POA: Insufficient documentation

## 2019-06-13 DIAGNOSIS — Z791 Long term (current) use of non-steroidal anti-inflammatories (NSAID): Secondary | ICD-10-CM | POA: Insufficient documentation

## 2019-06-13 DIAGNOSIS — Z885 Allergy status to narcotic agent status: Secondary | ICD-10-CM | POA: Insufficient documentation

## 2019-06-13 DIAGNOSIS — M069 Rheumatoid arthritis, unspecified: Secondary | ICD-10-CM | POA: Diagnosis not present

## 2019-06-13 DIAGNOSIS — K219 Gastro-esophageal reflux disease without esophagitis: Secondary | ICD-10-CM | POA: Diagnosis not present

## 2019-06-13 DIAGNOSIS — Q796 Ehlers-Danlos syndrome, unspecified: Secondary | ICD-10-CM | POA: Diagnosis not present

## 2019-06-13 DIAGNOSIS — Z7951 Long term (current) use of inhaled steroids: Secondary | ICD-10-CM | POA: Diagnosis not present

## 2019-06-13 DIAGNOSIS — G40909 Epilepsy, unspecified, not intractable, without status epilepticus: Secondary | ICD-10-CM | POA: Insufficient documentation

## 2019-06-13 DIAGNOSIS — B351 Tinea unguium: Secondary | ICD-10-CM | POA: Insufficient documentation

## 2019-06-13 DIAGNOSIS — G4700 Insomnia, unspecified: Secondary | ICD-10-CM | POA: Diagnosis not present

## 2019-06-13 DIAGNOSIS — Z881 Allergy status to other antibiotic agents status: Secondary | ICD-10-CM | POA: Insufficient documentation

## 2019-06-13 DIAGNOSIS — Z79899 Other long term (current) drug therapy: Secondary | ICD-10-CM | POA: Insufficient documentation

## 2019-06-13 DIAGNOSIS — Z888 Allergy status to other drugs, medicaments and biological substances status: Secondary | ICD-10-CM | POA: Diagnosis not present

## 2019-06-13 DIAGNOSIS — L600 Ingrowing nail: Secondary | ICD-10-CM | POA: Diagnosis not present

## 2019-06-13 DIAGNOSIS — J45909 Unspecified asthma, uncomplicated: Secondary | ICD-10-CM | POA: Diagnosis not present

## 2019-06-13 DIAGNOSIS — Z886 Allergy status to analgesic agent status: Secondary | ICD-10-CM | POA: Diagnosis not present

## 2019-06-13 DIAGNOSIS — G4730 Sleep apnea, unspecified: Secondary | ICD-10-CM | POA: Diagnosis not present

## 2019-06-13 DIAGNOSIS — I341 Nonrheumatic mitral (valve) prolapse: Secondary | ICD-10-CM | POA: Diagnosis not present

## 2019-06-13 HISTORY — DX: Fibromyalgia: M79.7

## 2019-06-13 HISTORY — PX: TOENAIL EXCISION: SHX183

## 2019-06-13 SURGERY — MINOR TOENAIL EXCISION
Anesthesia: Monitor Anesthesia Care | Site: Toe | Laterality: Bilateral

## 2019-06-13 MED ORDER — FENTANYL CITRATE (PF) 100 MCG/2ML IJ SOLN
INTRAMUSCULAR | Status: AC
  Filled 2019-06-13: qty 2

## 2019-06-13 MED ORDER — MIDAZOLAM HCL 2 MG/2ML IJ SOLN
1.0000 mg | INTRAMUSCULAR | Status: DC | PRN
  Administered 2019-06-13 (×2): 1 mg via INTRAVENOUS

## 2019-06-13 MED ORDER — BUPIVACAINE HCL 0.25 % IJ SOLN
INTRAMUSCULAR | Status: DC | PRN
  Administered 2019-06-13: 10 mL

## 2019-06-13 MED ORDER — CLINDAMYCIN PHOSPHATE 900 MG/50ML IV SOLN
INTRAVENOUS | Status: AC
  Filled 2019-06-13: qty 50

## 2019-06-13 MED ORDER — OXYCODONE HCL 5 MG/5ML PO SOLN: 5.0000 mg | Freq: Once | ORAL | Status: AC | PRN

## 2019-06-13 MED ORDER — HYDROMORPHONE HCL 1 MG/ML IJ SOLN
0.2500 mg | INTRAMUSCULAR | Status: DC | PRN
  Administered 2019-06-13 (×2): 0.5 mg via INTRAVENOUS

## 2019-06-13 MED ORDER — MIDAZOLAM HCL 2 MG/2ML IJ SOLN
INTRAMUSCULAR | Status: AC
  Filled 2019-06-13: qty 2

## 2019-06-13 MED ORDER — PROPOFOL 10 MG/ML IV BOLUS
INTRAVENOUS | Status: AC
  Filled 2019-06-13: qty 20

## 2019-06-13 MED ORDER — FENTANYL CITRATE (PF) 100 MCG/2ML IJ SOLN
50.0000 ug | INTRAMUSCULAR | Status: DC | PRN
  Administered 2019-06-13: 50 ug via INTRAVENOUS

## 2019-06-13 MED ORDER — ONDANSETRON HCL 4 MG PO TABS: 4.0000 mg | ORAL_TABLET | Freq: Every day | ORAL | 1 refills | Status: AC | PRN

## 2019-06-13 MED ORDER — FENTANYL CITRATE (PF) 100 MCG/2ML IJ SOLN
25.0000 ug | INTRAMUSCULAR | Status: DC | PRN
  Administered 2019-06-13: 50 ug via INTRAVENOUS
  Administered 2019-06-13: 25 ug via INTRAVENOUS

## 2019-06-13 MED ORDER — POVIDONE-IODINE 10 % EX SWAB: 2.0000 "application " | Freq: Once | CUTANEOUS | Status: DC

## 2019-06-13 MED ORDER — PROMETHAZINE HCL 25 MG/ML IJ SOLN: 6.2500 mg | INTRAMUSCULAR | Status: DC | PRN

## 2019-06-13 MED ORDER — CLINDAMYCIN PHOSPHATE 900 MG/50ML IV SOLN
900.0000 mg | INTRAVENOUS | Status: AC
  Administered 2019-06-13: 900 mg via INTRAVENOUS

## 2019-06-13 MED ORDER — BUPIVACAINE HCL 0.5 % IJ SOLN
INTRAMUSCULAR | Status: DC | PRN
  Administered 2019-06-13: 10 mL

## 2019-06-13 MED ORDER — OXYCODONE HCL 5 MG PO TABS
ORAL_TABLET | ORAL | Status: AC
  Filled 2019-06-13: qty 1

## 2019-06-13 MED ORDER — HYDROMORPHONE HCL 1 MG/ML IJ SOLN
INTRAMUSCULAR | Status: AC
  Filled 2019-06-13: qty 0.5

## 2019-06-13 MED ORDER — OXYCODONE HCL 5 MG PO TABS
5.0000 mg | ORAL_TABLET | Freq: Once | ORAL | Status: AC | PRN
  Administered 2019-06-13: 5 mg via ORAL

## 2019-06-13 MED ORDER — TRAMADOL HCL 50 MG PO TABS: 50.0000 mg | ORAL_TABLET | Freq: Four times a day (QID) | ORAL | 0 refills | Status: DC | PRN

## 2019-06-13 MED ORDER — LACTATED RINGERS IV SOLN
INTRAVENOUS | Status: DC
  Administered 2019-06-13: 11:00:00 via INTRAVENOUS

## 2019-06-13 MED ORDER — PROPOFOL 500 MG/50ML IV EMUL
INTRAVENOUS | Status: DC | PRN
  Administered 2019-06-13: 200 ug/kg/min via INTRAVENOUS

## 2019-06-13 MED ORDER — ONDANSETRON HCL 4 MG/2ML IJ SOLN
INTRAMUSCULAR | Status: DC | PRN
  Administered 2019-06-13: 4 mg via INTRAVENOUS

## 2019-06-13 SURGICAL SUPPLY — 67 items
APL PRP STRL LF DISP 70% ISPRP (MISCELLANEOUS) ×1
APL SKNCLS STERI-STRIP NONHPOA (GAUZE/BANDAGES/DRESSINGS)
APL SRG 3 HI ABS STRL LF PLS (MISCELLANEOUS) ×1
APPLICATOR DR MATTHEWS STRL (MISCELLANEOUS) ×1 IMPLANT
BANDAGE ESMARK 6X9 LF (GAUZE/BANDAGES/DRESSINGS) IMPLANT
BENZOIN TINCTURE PRP APPL 2/3 (GAUZE/BANDAGES/DRESSINGS) IMPLANT
BLADE AVERAGE 25X9 (BLADE) IMPLANT
BLADE OSC/SAG .038X5.5 CUT EDG (BLADE) IMPLANT
BLADE SURG 15 STRL LF DISP TIS (BLADE) ×3 IMPLANT
BLADE SURG 15 STRL SS (BLADE) ×6
BNDG CMPR 9X4 STRL LF SNTH (GAUZE/BANDAGES/DRESSINGS)
BNDG CMPR 9X6 STRL LF SNTH (GAUZE/BANDAGES/DRESSINGS)
BNDG ELASTIC 4X5.8 VLCR STR LF (GAUZE/BANDAGES/DRESSINGS) ×1 IMPLANT
BNDG ELASTIC 6X5.8 VLCR STR LF (GAUZE/BANDAGES/DRESSINGS) IMPLANT
BNDG ESMARK 4X9 LF (GAUZE/BANDAGES/DRESSINGS) IMPLANT
BNDG ESMARK 6X9 LF (GAUZE/BANDAGES/DRESSINGS)
BNDG GAUZE 1X2.1 STRL (MISCELLANEOUS) ×2 IMPLANT
CHLORAPREP W/TINT 26 (MISCELLANEOUS) ×2 IMPLANT
COVER BACK TABLE REUSABLE LG (DRAPES) ×2 IMPLANT
COVER WAND RF STERILE (DRAPES) IMPLANT
CUFF TOURN SGL QUICK 34 (TOURNIQUET CUFF)
CUFF TRNQT CYL 34X4.125X (TOURNIQUET CUFF) IMPLANT
DECANTER SPIKE VIAL GLASS SM (MISCELLANEOUS) IMPLANT
DRAPE EXTREMITY T 121X128X90 (DISPOSABLE) ×1 IMPLANT
DRAPE IMP U-DRAPE 54X76 (DRAPES) ×1 IMPLANT
DRAPE OEC MINIVIEW 54X84 (DRAPES) ×1 IMPLANT
DRAPE U-SHAPE 47X51 STRL (DRAPES) ×1 IMPLANT
ELECT REM PT RETURN 9FT ADLT (ELECTROSURGICAL) ×2
ELECTRODE REM PT RTRN 9FT ADLT (ELECTROSURGICAL) ×1 IMPLANT
GAUZE SPONGE 4X4 12PLY STRL (GAUZE/BANDAGES/DRESSINGS) ×2 IMPLANT
GAUZE XEROFORM 1X8 LF (GAUZE/BANDAGES/DRESSINGS) ×3 IMPLANT
GLOVE BIO SURGEON STRL SZ 6.5 (GLOVE) ×1 IMPLANT
GLOVE BIOGEL M STRL SZ7.5 (GLOVE) ×2 IMPLANT
GLOVE BIOGEL PI IND STRL 8 (GLOVE) ×1 IMPLANT
GLOVE BIOGEL PI INDICATOR 8 (GLOVE) ×2
GOWN STRL REUS W/ TWL LRG LVL3 (GOWN DISPOSABLE) ×1 IMPLANT
GOWN STRL REUS W/ TWL XL LVL3 (GOWN DISPOSABLE) ×1 IMPLANT
GOWN STRL REUS W/TWL LRG LVL3 (GOWN DISPOSABLE) ×2
GOWN STRL REUS W/TWL XL LVL3 (GOWN DISPOSABLE) ×2
NDL HYPO 25X1 1.5 SAFETY (NEEDLE) IMPLANT
NEEDLE HYPO 25X1 1.5 SAFETY (NEEDLE) IMPLANT
NS IRRIG 1000ML POUR BTL (IV SOLUTION) ×2 IMPLANT
PACK BASIN DAY SURGERY FS (CUSTOM PROCEDURE TRAY) ×2 IMPLANT
PAD CAST 4YDX4 CTTN HI CHSV (CAST SUPPLIES) ×1 IMPLANT
PADDING CAST COTTON 4X4 STRL (CAST SUPPLIES)
PADDING CAST SYNTHETIC 4 (CAST SUPPLIES)
PADDING CAST SYNTHETIC 4X4 STR (CAST SUPPLIES) IMPLANT
PENCIL BUTTON HOLSTER BLD 10FT (ELECTRODE) ×2 IMPLANT
SLEEVE SCD COMPRESS KNEE MED (MISCELLANEOUS) ×1 IMPLANT
SPONGE LAP 18X18 RF (DISPOSABLE) IMPLANT
STOCKINETTE 6  STRL (DRAPES) ×2
STOCKINETTE 6 STRL (DRAPES) ×1 IMPLANT
STRIP CLOSURE SKIN 1/2X4 (GAUZE/BANDAGES/DRESSINGS) IMPLANT
SUCTION FRAZIER HANDLE 10FR (MISCELLANEOUS) ×1
SUCTION TUBE FRAZIER 10FR DISP (MISCELLANEOUS) ×1 IMPLANT
SUT 0 FIBERLOOP 38 BLUE TPR ND (SUTURE)
SUT BONE WAX W31G (SUTURE) IMPLANT
SUT ETHILON 3 0 PS 1 (SUTURE) ×1 IMPLANT
SUT MNCRL AB 3-0 PS2 18 (SUTURE) ×1 IMPLANT
SUT PDS AB 2-0 CT2 27 (SUTURE) IMPLANT
SUT VIC AB 3-0 FS2 27 (SUTURE) IMPLANT
SUTURE 0 FIBERLP 38 BLU TPR ND (SUTURE) IMPLANT
SYR BULB 3OZ (MISCELLANEOUS) ×2 IMPLANT
SYR CONTROL 10ML LL (SYRINGE) ×2 IMPLANT
TOWEL GREEN STERILE FF (TOWEL DISPOSABLE) ×4 IMPLANT
TUBE CONNECTING 20X1/4 (TUBING) ×1 IMPLANT
UNDERPAD 30X36 HEAVY ABSORB (UNDERPADS AND DIAPERS) ×2 IMPLANT

## 2019-06-13 NOTE — Anesthesia Preprocedure Evaluation (Addendum)
Anesthesia Evaluation  Patient identified by MRN, date of birth, ID band Patient awake    Reviewed: Allergy & Precautions, NPO status , Patient's Chart, lab work & pertinent test results  History of Anesthesia Complications Negative for: history of anesthetic complications  Airway Mallampati: II  TM Distance: >3 FB Neck ROM: Full    Dental  (+) Dental Advisory Given, Teeth Intact   Pulmonary asthma , sleep apnea and Continuous Positive Airway Pressure Ventilation ,    Pulmonary exam normal        Cardiovascular Normal cardiovascular exam   '19 TTE - EF 60% to 65%. Mild MR    Neuro/Psych  Headaches, Seizures -, Well Controlled,  PSYCHIATRIC DISORDERS Depression    GI/Hepatic Neg liver ROS, GERD  Medicated and Controlled,  Endo/Other  negative endocrine ROS  Renal/GU negative Renal ROS     Musculoskeletal  (+) Arthritis , Fibromyalgia -, narcotic dependent Ehlers-Danlos syndrome    Abdominal   Peds  Hematology negative hematology ROS (+)   Anesthesia Other Findings   Reproductive/Obstetrics                            Anesthesia Physical Anesthesia Plan  ASA: III  Anesthesia Plan: MAC   Post-op Pain Management:    Induction: Intravenous  PONV Risk Score and Plan: 2 and Propofol infusion and Treatment may vary due to age or medical condition  Airway Management Planned: Natural Airway and Simple Face Mask  Additional Equipment: None  Intra-op Plan:   Post-operative Plan:   Informed Consent: I have reviewed the patients History and Physical, chart, labs and discussed the procedure including the risks, benefits and alternatives for the proposed anesthesia with the patient or authorized representative who has indicated his/her understanding and acceptance.       Plan Discussed with: CRNA and Anesthesiologist  Anesthesia Plan Comments:        Anesthesia Quick  Evaluation

## 2019-06-13 NOTE — Transfer of Care (Signed)
Immediate Anesthesia Transfer of Care Note  Patient: Karen [name]  Procedure(s) Performed: BILATERAL SECOND TOE PARTIAL NAIL ABLATION (Bilateral Toe)  Patient Location: PACU  Anesthesia Type:MAC  Level of Consciousness: awake, alert  and oriented  Airway & Oxygen Therapy: Patient Spontanous Breathing and Patient connected to face mask oxygen  Post-op Assessment: Report given to RN and Post -op Vital signs reviewed and stable  Post vital signs: Reviewed and stable  Last Vitals:  Vitals Value Taken Time  BP    Temp    Pulse 69 06/13/19 1201  Resp 12 06/13/19 1201  SpO2 100 % 06/13/19 1201  Vitals shown include unvalidated device data.  Last Pain:  Vitals:   06/13/19 1022  TempSrc: Oral  PainSc: 5       Patients Stated Pain Goal: 5 (17/79/39 0300)  Complications: No apparent anesthesia complications

## 2019-06-13 NOTE — Discharge Instructions (Signed)
DR. Lucia Gaskins FOOT & ANKLE SURGERY POST-OP INSTRUCTIONS   Pain Management 1. The numbing medicine and your leg will last around 8 hours, take a dose of your pain medicine as soon as you feel it wearing off to avoid rebound pain. 2. Keep your foot elevated above heart level.  Make sure that your heel hangs free ('floats'). 3. Take all prescribed medication as directed. 4. If taking narcotic pain medication you may want to use an over-the-counter stool softener to avoid constipation. 5. You may take over-the-counter NSAIDs (ibuprofen, naproxen, etc.) as well as over-the-counter acetaminophen as directed on the packaging as a supplement for your pain and may also use it to wean away from the prescription medication.  Activity ? Weightbearing as tolerated in post operative shoe. ? Keep dressing in place  First Postoperative Visit 1. Your first postop visit will be at least 2 weeks after surgery.  This should be scheduled when you schedule surgery. 2. If you do not have a postoperative visit scheduled please call [phone] to schedule an appointment. 3. At the appointment your incision will be evaluated for suture removal, x-rays will be obtained if necessary.  General Instructions 1. Swelling is very common after foot and ankle surgery.  It often takes 3 months for the foot and ankle to begin to feel comfortable.  Some amount of swelling will persist for 6-12 months. 2. DO NOT change the dressing.  If there is a problem with the dressing (too tight, loose, gets wet, etc.) please contact Dr. Pollie Friar office. 3. DO NOT get the dressing wet.  For showers you can use an over-the-counter cast cover or wrap a washcloth around the top of your dressing and then cover it with a plastic bag and tape it to your leg. 4. DO NOT soak the incision (no tubs, pools, bath, etc.) until you have approval from Dr. Lucia Gaskins.  Contact Dr. Huel Cote office or go to Emergency Room if: 1. Temperature above 101  F. 2. Increasing pain that is unresponsive to pain medication or elevation 3. Excessive redness or swelling in your foot 4. Dressing problems - excessive bloody drainage, looseness or tightness, or if dressing gets wet 5. Develop pain, swelling, warmth, or discoloration of your calf   Post Anesthesia Home Care Instructions  Activity: Get plenty of rest for the remainder of the day. A responsible individual must stay with you for 24 hours following the procedure.  For the next 24 hours, DO NOT: -Drive a car -Paediatric nurse -Drink alcoholic beverages -Take any medication unless instructed by your physician -Make any legal decisions or sign important papers.  Meals: Start with liquid foods such as gelatin or soup. Progress to regular foods as tolerated. Avoid greasy, spicy, heavy foods. If nausea and/or vomiting occur, drink only clear liquids until the nausea and/or vomiting subsides. Call your physician if vomiting continues.  Special Instructions/Symptoms: Your throat may feel dry or sore from the anesthesia or the breathing tube placed in your throat during surgery. If this causes discomfort, gargle with warm salt water. The discomfort should disappear within 24 hours.  If you had a scopolamine patch placed behind your ear for the management of post- operative nausea and/or vomiting:  1. The medication in the patch is effective for 72 hours, after which it should be removed.  Wrap patch in a tissue and discard in the trash. Wash hands thoroughly with soap and water. 2. You may remove the patch earlier than 72 hours if you experience unpleasant side  effects which may include dry mouth, dizziness or visual disturbances. 3. Avoid touching the patch. Wash your hands with soap and water after contact with the patch.

## 2019-06-13 NOTE — H&P (Signed)
Karen [name] is an 43 y.o. female.   Chief Complaint: Bilateral thickened second toenails with pain HPI: Patient is here today for partial ablation of her bilateral second toenails.  She had pain for several months.  She tried to trim them but they just recur.  She does have a history of Ehlers-Danlos syndrome and uses a wheelchair at baseline.  She is been tripping some when she tries to ambulate.  She understands the risk benefits alternatives of surgery and wished to proceed.  Past Medical History:  Diagnosis Date  . Ehlers-Danlos syndrome   . Fibromyalgia   . Headache   . Sleep apnea   . Tics of organic origin   . Transient alteration of awareness     Past Surgical History:  Procedure Laterality Date  . COLONOSCOPY WITH PROPOFOL N/A 02/13/2018   Procedure: COLONOSCOPY WITH PROPOFOL;  Surgeon: [name], MD;  Location: [facility] ENDOSCOPY;  Service: Endoscopy;  Laterality: N/A;  . DILATION AND CURETTAGE OF UTERUS  2009  . ESOPHAGOGASTRODUODENOSCOPY (EGD) WITH PROPOFOL  02/13/2018   Procedure: ESOPHAGOGASTRODUODENOSCOPY (EGD) WITH PROPOFOL;  Surgeon: [name], MD;  Location: [facility] ENDOSCOPY;  Service: Endoscopy;;  . EYE SURGERY Left 1990   3 Surgeries on left eye to correct crossed eye  . LAPAROSCOPY Left 2003   Fallopian Tube  . MANDIBLE SURGERY  1998  . OTHER SURGICAL HISTORY Right 1987   3 Surgeries to repair broken arm  . OTHER SURGICAL HISTORY Left    3 surgeries on her left thigh as an infant  . OTHER SURGICAL HISTORY  1998   Jaw surgery  . Right arm surgery  1987   x 3 due to fracture  . TONSILLECTOMY  12/13/2002    Family History  Problem Relation Age of Onset  . Depression Mother   . Stroke Mother   . Fibromyalgia Mother   . Osteoarthritis Mother   . Asthma Brother   . Depression Brother   . Migraines Brother   . Asperger's syndrome Brother   . Other Brother        Ehlers-Danlos Syndrome  . Cancer Maternal Aunt   . Asperger's syndrome Maternal Aunt   .  Heart disease Paternal Aunt   . Heart disease Paternal Uncle   . Cervical cancer Maternal Grandmother   . Osteoporosis Maternal Grandmother        Died at 38  . Osteoarthritis Maternal Grandmother   . Colon cancer Maternal Grandfather   . Asthma Maternal Grandfather   . Asperger's syndrome Maternal Grandfather   . Emphysema Maternal Grandfather        Died at 61  . Prostate cancer Maternal Grandfather   . Heart attack Paternal Grandfather        Died at 22  . Asthma Paternal Grandfather   . Tuberculosis Paternal Grandfather   . Cancer Cousin   . Asperger's syndrome Cousin   . Asperger's syndrome Other   . Heart Problems Paternal Grandmother        Died at 19  . Ehlers-Danlos syndrome Father   . Ehlers-Danlos syndrome Brother    Social History:  reports that she has never smoked. She has never used smokeless tobacco. She reports that she does not drink alcohol or use drugs.  Allergies:  Allergies  Allergen Reactions  . Chocolate Flavor     Other reaction(s): Other (See Comments) Wheezing, acne  . Demerol [Meperidine] Other (See Comments)    Slow to wake up when this drug is given.   Marland Kitchen  Keflex [Cephalexin] Nausea And Vomiting  . Morphine And Related Nausea And Vomiting  . Toradol [Ketorolac Tromethamine] Nausea And Vomiting  . Augmentin  [Amoxicillin-Pot Clavulanate]   . Chocolate     GI distress  . Nsaids     patient develops ulcers  . Strawberry Extract     GI distress  . Tape Dermatitis    Medications Prior to Admission  Medication Sig Dispense Refill  . albuterol (VENTOLIN HFA) 108 (90 Base) MCG/ACT inhaler Inhale 2 puffs into the lungs every 6 (six) hours as needed. 1 Inhaler 12  . amphetamine-dextroamphetamine (ADDERALL XR) 15 MG 24 hr capsule Take 1 capsule by mouth every morning. 31 capsule 0  . busPIRone (BUSPAR) 10 MG tablet TAKE 1 TABLET BY MOUTH TWICE A DAY 180 tablet 1  . celecoxib (CELEBREX) 200 MG capsule TAKE 1 CAPSULE BY MOUTH 2 TIMES A DAY 60 capsule  11  . DEXILANT 30 MG capsule TAKE ONE CAPSULE BY MOUTH DAILY 30 capsule 11  . diclofenac sodium (VOLTAREN) 1 % GEL APPLY AS NEEDED 3 TIMES A DAY 100 g 5  . DULoxetine (CYMBALTA) 60 MG capsule TAKE 2 CAPSULES BY MOUTH DAILY 180 capsule 4  . fentaNYL (DURAGESIC) 12 MCG/HR Place 1 patch onto the skin every 3 (three) days. 10 patch 0  . fentaNYL (DURAGESIC) 75 MCG/HR Place 1 patch onto the skin every 3 (three) days. 10 patch 0  . fexofenadine (ALLEGRA) 180 MG tablet Take 180 mg by mouth daily.    . fluticasone (FLONASE) 50 MCG/ACT nasal spray SPRAY 2 SPRAYS INTO EACH NOSTRIL EVERY DAY 16 g 2  . Fluticasone-Salmeterol (ADVAIR) 250-50 MCG/DOSE AEPB Inhale 1 puff into the lungs every 12 (twelve) hours. Rinse mouth after use 60 each 11  . gabapentin (NEURONTIN) 600 MG tablet TAKE 2 TABLETS IN THE MORNING AND 3 TABLETS AT NIGHTTIME 155 tablet 5  . haloperidol (HALDOL) 0.5 MG tablet Take 0.5 tablets (0.25 mg total) by mouth 2 (two) times daily. 31 tablet 5  . loratadine (CLARITIN) 10 MG tablet Take 10 mg by mouth daily.    . ondansetron (ZOFRAN-ODT) 4 MG disintegrating tablet DISSOLVE 1 TABLET IN MOUTH EVERY 8 HOURS FOR NAUSEA AND VOMITING 30 tablet 5  . pregabalin (LYRICA) 300 MG capsule TAKE 1 CAPSULE BY MOUTH TWICE A DAY 180 capsule 4  . QUEtiapine (SEROQUEL) 200 MG tablet TAKE 1 TABLET (200 MG TOTAL) BY MOUTH AT BEDTIME. 90 tablet 4  . tiZANidine (ZANAFLEX) 4 MG tablet Take 0.5-1 tablets (2-4 mg total) by mouth every 8 (eight) hours as needed for muscle spasms. 90 tablet 4    No results found for this or any previous visit (from the past 48 hour(s)). No results found.  Review of Systems  Constitutional: Negative.   HENT: Negative.   Eyes: Negative.   Respiratory: Negative.   Cardiovascular: Negative.   Gastrointestinal: Negative.   Musculoskeletal:       Bilateral 2nd toe nail pain  Skin: Negative.   Neurological: Negative.   Psychiatric/Behavioral: Negative.     Blood pressure (!)  132/92, pulse 82, temperature 97.9 F (36.6 C), temperature source Oral, resp. rate 20, height 4\' 11"  (1.499 m), weight 65.9 kg, SpO2 98 %. Physical Exam  Constitutional: She appears well-developed.  HENT:  Head: Normocephalic.  Eyes: Conjunctivae are normal.  Neck: Neck supple.  Cardiovascular: Normal rate.  Respiratory: Effort normal.  GI: Soft.  Musculoskeletal:     Comments: Bilateral thickened and ingrown second toenails medially.  Neurological:  She is alert.  Skin: Skin is warm.  Psychiatric: She has a normal mood and affect.     Assessment/Plan We will proceed with partial nail ablation of the bilateral second toes.  We discussed the risk benefits alternatives of surgery which include but not limited to wound healing complications, infection, return of the nail and continued pain.  She would like to proceed.  Erle Crocker, MD 06/13/2019, 10:35 AM

## 2019-06-13 NOTE — Anesthesia Postprocedure Evaluation (Signed)
Anesthesia Post Note  Patient: Karen [name]  Procedure(s) Performed: BILATERAL SECOND TOE PARTIAL NAIL ABLATION (Bilateral Toe)     Patient location during evaluation: PACU Anesthesia Type: MAC Level of consciousness: awake and alert Pain management: pain level controlled Vital Signs Assessment: post-procedure vital signs reviewed and stable Respiratory status: spontaneous breathing, nonlabored ventilation, respiratory function stable and patient connected to nasal cannula oxygen Cardiovascular status: stable and blood pressure returned to baseline Postop Assessment: no apparent nausea or vomiting Anesthetic complications: no    Last Vitals:  Vitals:   06/13/19 1245 06/13/19 1300  BP: (!) 138/95   Pulse: 62 64  Resp: 14 16  Temp:    SpO2: 100% 99%    Last Pain:  Vitals:   06/13/19 1245  TempSrc:   PainSc: 10-Worst pain ever                 Avanish Cerullo

## 2019-06-17 ENCOUNTER — Encounter (HOSPITAL_BASED_OUTPATIENT_CLINIC_OR_DEPARTMENT_OTHER): Payer: Self-pay | Admitting: Orthopaedic Surgery

## 2019-06-26 DIAGNOSIS — M79674 Pain in right toe(s): Secondary | ICD-10-CM | POA: Diagnosis not present

## 2019-06-26 DIAGNOSIS — M79675 Pain in left toe(s): Secondary | ICD-10-CM | POA: Diagnosis not present

## 2019-06-26 NOTE — Op Note (Signed)
  Karen [name] female 43 y.o. 06/26/2019  PreOperative Diagnosis: Bilateral second toenail thickening and pain with onychomycosis  PostOperative Diagnosis: Same  PROCEDURE: Bilateral second toenail partial ablation  SURGEON: Melony Overly, MD  ASSISTANT: None  ANESTHESIA: MAC anesthesia with local nerve blockade  FINDINGS: Thickened second toenails medially  IMPLANTS: None  INDICATIONS:43 y.o. female had symptomatic second toenails that were ingrown and thickened.  She wished to have them partially excised and ablated.  She understood the risk benefits alternatives which included continued pain, infection, need for further surgery, continued nail issues and wished to proceed with surgery.  She also understood the possibility of infection.  PROCEDURE: Patient was identified in the preoperative holding area.  Bilateral feet were marked by myself.  She was taken to the operative suite placed supine the operative table.  MAC anesthesia was induced without difficulty.  Bilateral lower extremities were prepped and draped in usual sterile fashion and a digital block was performed about each second toe bilaterally.  Then the left toe was exsanguinated using a vessel loop.  The second toenail was partially ablated medially.  Nail trimmer was used to partially ablate the nail and a knife was used to excise the nail matrix on the medial aspect including the sterile matrix and germinal matrix.  Curette was used to fully ablate the germinal matrix portion and the tissue of the nail fold was excised.  Then phenol was used using a Q-tip in the area of the germinal matrix.  This was then cleared off with sterile water.  Then using a 3-0 nylon stitch the medial aspect of the nail tissue was sewn back over the prior area of nail.  Then  the right toe was exsanguinated using a vessel loop.  The second toenail was partially ablated medially.  Nail trimmer was used to partially ablate the nail and a  knife was used to excise the nail matrix on the medial aspect including the sterile matrix and germinal matrix.  Curette was used to fully ablate the germinal matrix portion and the tissue of the nail fold was excised.  Then phenol was used using a Q-tip in the area of the germinal matrix.  This was then cleared off with sterile water.  Then using a 3-0 nylon stitch the medial aspect of the nail tissue was sewn back over the prior area of nail.   After the procedure both nails were without significant mount of bleeding.  There is correction of the nail.  We placed a soft dressing on bilateral feet.  She was awakened from anesthesia and taken recovery in stable condition.  There are no complications.  POST OPERATIVE INSTRUCTIONS: Keep dressing in place until follow-up. She will follow-up in 2 weeks for wound check.   BLOOD LOSS:  Minimal         DRAINS: none         SPECIMEN: none       COMPLICATIONS:  * No complications entered in OR log *         Disposition: PACU - hemodynamically stable.         Condition: stable

## 2019-06-27 ENCOUNTER — Other Ambulatory Visit: Payer: Self-pay | Admitting: Family Medicine

## 2019-06-27 ENCOUNTER — Telehealth: Payer: Self-pay | Admitting: Internal Medicine

## 2019-06-27 DIAGNOSIS — J4531 Mild persistent asthma with (acute) exacerbation: Secondary | ICD-10-CM

## 2019-06-27 DIAGNOSIS — M47816 Spondylosis without myelopathy or radiculopathy, lumbar region: Secondary | ICD-10-CM | POA: Diagnosis not present

## 2019-06-27 MED ORDER — ALBUTEROL SULFATE HFA 108 (90 BASE) MCG/ACT IN AERS: 2.0000 | INHALATION_SPRAY | Freq: Four times a day (QID) | RESPIRATORY_TRACT | 12 refills | Status: DC | PRN

## 2019-06-27 MED ORDER — AMPHETAMINE-DEXTROAMPHET ER 15 MG PO CP24: 15.0000 mg | ORAL_CAPSULE | ORAL | 0 refills | Status: DC

## 2019-06-27 NOTE — Telephone Encounter (Signed)
Pt contacted office for refill request on the following medications:  1. fentaNYL (DURAGESIC) 12 MCG/HR 2. fentaNYL ([place]) 75 MCG/HR  [facility] [address] Last Rx: 05/27/2019 LOV: 08/10/2018 Pt stated she needs the medication tomorrow. Pt advise Dr. Caryn Section is out of the office on Thursdays. Please advise. Thanks TNP

## 2019-06-27 NOTE — Telephone Encounter (Signed)
Called and spoke with pt letting her know [facility] refilled both meds for her. Pt verbalized understanding. Nothing further needed.

## 2019-06-27 NOTE — Telephone Encounter (Signed)
Pt requesting refill on Ventolin and Adderall 15 mg. [facility]acy[facility] [address] [address]. Last Adderall fill 04/23/19 Patient Instructions by Deneise Lever, MD at 03/18/2019 3:00 PM Author: Deneise Lever, MD Author Type: Physician Filed: 03/18/2019 3:13 PM  Note Status: Signed Cosign: Cosign Not Required Encounter Date: 03/18/2019  Editor: Deneise Lever, MD (Physician)    We can continue BIPAP ST 24/13 with back up rate 16, mask of choice, humidifier, supplies, AirView/ card  [facility] to continue Advair 250. Albuterol rescue inhaler and Adderall XR. Please call for refills, and let us know if we can help.

## 2019-06-27 NOTE — Telephone Encounter (Signed)
Refills sent for albuterol inhaler and adderall

## 2019-06-28 MED ORDER — FENTANYL 75 MCG/HR TD PT72: 1.0000 | MEDICATED_PATCH | TRANSDERMAL | 0 refills | Status: DC

## 2019-06-28 MED ORDER — FENTANYL 12 MCG/HR TD PT72: 1.0000 | MEDICATED_PATCH | TRANSDERMAL | 0 refills | Status: DC

## 2019-06-28 NOTE — Telephone Encounter (Signed)
Patient called back concerning refills below. Please advise?

## 2019-06-29 ENCOUNTER — Other Ambulatory Visit: Payer: Self-pay | Admitting: Family Medicine

## 2019-07-25 ENCOUNTER — Other Ambulatory Visit: Payer: Self-pay | Admitting: Family Medicine

## 2019-07-25 NOTE — Telephone Encounter (Signed)
Requested medication (s) are due for refill today: Fentanyl 12 mcg/hr yes  Requested medication (s) are on the active medication list: yes  Last refill:  06/27/2019  Future visit scheduled: no  Notes to clinic: Not delegated  Requested medication (s) are due for refill today: Fentanyl 75 mcg/hr  Requested medication (s) are on the active medication list: {yes  Last refill: 06/27/2019  Future visit scheduled: no  Notes to clinic:  not delegated  Requested Prescriptions  Pending Prescriptions Disp Refills   fentaNYL (DURAGESIC) 75 MCG/HR 10 patch 0    Sig: Place 1 patch onto the skin every 3 (three) days.      Not Delegated - Analgesics:  Opioid Agonists Failed - 07/25/2019  3:13 PM      Failed - This refill cannot be delegated      Failed - Urine Drug Screen completed in last 360 days.      Failed - Valid encounter within last 6 months    Recent Outpatient Visits           11 months ago Mild persistent asthma with exacerbation   [facility] Birdie Sons, MD   11 months ago [place], [place]   1 year ago [place], [place]   1 year ago Dysfunction of both eustachian tubes   [place], [place]   2 years ago Chronic bilateral back pain, unspecified back location   [place], [place]                fentaNYL (DURAGESIC) 12 MCG/HR 10 patch 0    Sig: Place 1 patch onto the skin every 3 (three) days.      Not Delegated - Analgesics:  Opioid Agonists Failed - 07/25/2019  3:13 PM      Failed - This refill cannot be delegated      Failed - Urine Drug Screen completed in last 360 days.      Failed - Valid encounter within last 6 months    Recent Outpatient Visits           11 months ago Mild persistent asthma with exacerbation   [facility] Birdie Sons, MD   11 months ago [place], [place]   1 year ago [place], [place]   1 year ago Dysfunction of both eustachian tubes   [place], [place]   2 years ago Chronic bilateral back pain, unspecified back location   [place], Kirstie Peri, MD

## 2019-07-25 NOTE — Telephone Encounter (Signed)
Medication refill:  fentaNYL (DURAGESIC) 12 MCG/HR [id number]  fentaNYL ([place]) 75 MCG/HR [id number]      [id number] [place] [place] Phone:  [phone]  Fax:  [phone]       Pt aware of turn around time

## 2019-07-26 ENCOUNTER — Other Ambulatory Visit: Payer: Self-pay | Admitting: Family Medicine

## 2019-07-26 NOTE — Telephone Encounter (Signed)
Medication: fentaNYL ([place]) 12 MCG/HR [id number] , fentaNYL ([place]) 75 MCG/HR [id number]   Has the patient contacted their pharmacy? Yes  (Agent: If no, request that the patient contact the pharmacy for the refill.) (Agent: If yes, when and what did the pharmacy advise?)  Preferred Pharmacy (with phone number or street name): [id number] [place], [place]  Phone:  [phone] Fax:  [phone]     Agent: Please be advised that RX refills may take up to 3 business days. We ask that you follow-up with your pharmacy.

## 2019-07-26 NOTE — Telephone Encounter (Signed)
Requested medication (s) are due for refill today: yes  Requested medication (s) are on the active medication list: yes  Last refill:  06/28/2019  Future visit scheduled: no  Notes to clinic:  refill cannot be delegated    Requested Prescriptions  Pending Prescriptions Disp Refills   fentaNYL (DURAGESIC) 12 MCG/HR 10 patch 0    Sig: Place 1 patch onto the skin every 3 (three) days.      Not Delegated - Analgesics:  Opioid Agonists Failed - 07/26/2019  8:55 AM      Failed - This refill cannot be delegated      Failed - Urine Drug Screen completed in last 360 days.      Failed - Valid encounter within last 6 months    Recent Outpatient Visits           11 months ago Mild persistent asthma with exacerbation   [facility] Birdie Sons, MD   11 months ago [place], [place]   1 year ago [place], [place]   1 year ago Dysfunction of both eustachian tubes   [place], [place]   2 years ago Chronic bilateral back pain, unspecified back location   [place], [place]                fentaNYL (DURAGESIC) 75 MCG/HR 10 patch 0    Sig: Place 1 patch onto the skin every 3 (three) days.      Not Delegated - Analgesics:  Opioid Agonists Failed - 07/26/2019  8:55 AM      Failed - This refill cannot be delegated      Failed - Urine Drug Screen completed in last 360 days.      Failed - Valid encounter within last 6 months    Recent Outpatient Visits           11 months ago Mild persistent asthma with exacerbation   [facility] Birdie Sons, MD   11 months ago [place], [place]   1 year ago [place], [place]   1 year ago Dysfunction of both eustachian tubes   [place], [place]   2 years  ago Chronic bilateral back pain, unspecified back location   [place], Kirstie Peri, MD

## 2019-07-27 MED ORDER — FENTANYL 12 MCG/HR TD PT72: 1.0000 | MEDICATED_PATCH | TRANSDERMAL | 0 refills | Status: DC

## 2019-07-27 MED ORDER — FENTANYL 75 MCG/HR TD PT72: 1.0000 | MEDICATED_PATCH | TRANSDERMAL | 0 refills | Status: DC

## 2019-07-30 ENCOUNTER — Telehealth: Payer: Self-pay

## 2019-07-30 DIAGNOSIS — Q796 Ehlers-Danlos syndrome, unspecified: Secondary | ICD-10-CM

## 2019-07-30 DIAGNOSIS — M79671 Pain in right foot: Secondary | ICD-10-CM

## 2019-07-30 DIAGNOSIS — M79672 Pain in left foot: Secondary | ICD-10-CM

## 2019-07-30 NOTE — Telephone Encounter (Signed)
Judson Roch, do you know anything about this? I have no idea.

## 2019-07-30 NOTE — Telephone Encounter (Signed)
How many visits is it okay to authorize for patient? KW

## 2019-07-30 NOTE — Telephone Encounter (Signed)
Copied from [place] [id number]. Topic: Referral - Question >> Jul 30, 2019  8:58 AM Lennox Solders wrote: reason for CRM julie  with [facility] is calling and pt has [place] access and needs the need NPI and number of visit. Pt will see dr Bernerd Pho 08-02-2019 for bilaterat feet issue. Pt may have made her own appt

## 2019-07-31 NOTE — Telephone Encounter (Signed)
I can call and give them information if you want to approve it. I just need your approval

## 2019-08-01 ENCOUNTER — Telehealth: Payer: Self-pay | Admitting: Internal Medicine

## 2019-08-01 MED ORDER — AMPHETAMINE-DEXTROAMPHET ER 15 MG PO CP24: 15.0000 mg | ORAL_CAPSULE | ORAL | 0 refills | Status: DC

## 2019-08-01 NOTE — Telephone Encounter (Signed)
Referral has been placed for orthopedics.

## 2019-08-01 NOTE — Telephone Encounter (Signed)
Pt called needing to have her adderall refilled. Dr. Annamaria Boots, please advise if you are okay taking care of this for pt.  Allergies  Allergen Reactions  . Chocolate Flavor     Other reaction(s): Other (See Comments) Wheezing, acne  . Demerol [Meperidine] Other (See Comments)    Slow to wake up when this drug is given.   Marland Kitchen Keflex [Cephalexin] Nausea And Vomiting  . Morphine And Related Nausea And Vomiting  . Toradol [Ketorolac Tromethamine] Nausea And Vomiting  . Augmentin  [Amoxicillin-Pot Clavulanate]   . Chocolate     GI distress  . Nsaids     patient develops ulcers  . Strawberry Extract     GI distress  . Tape Dermatitis     Current Outpatient Medications:  .  albuterol (VENTOLIN HFA) 108 (90 Base) MCG/ACT inhaler, Inhale 2 puffs into the lungs every 6 (six) hours as needed., Disp: 8 g, Rfl: 12 .  amphetamine-dextroamphetamine (ADDERALL XR) 15 MG 24 hr capsule, Take 1 capsule by mouth every morning., Disp: 31 capsule, Rfl: 0 .  busPIRone (BUSPAR) 10 MG tablet, TAKE 1 TABLET BY MOUTH TWICE A DAY, Disp: 180 tablet, Rfl: 1 .  celecoxib (CELEBREX) 200 MG capsule, TAKE 1 CAPSULE BY MOUTH 2 TIMES A DAY, Disp: 60 capsule, Rfl: 11 .  DEXILANT 30 MG capsule, TAKE ONE CAPSULE BY MOUTH DAILY, Disp: 30 capsule, Rfl: 11 .  diclofenac sodium (VOLTAREN) 1 % GEL, APPLY AS NEEDED 3 TIMES A DAY, Disp: 100 g, Rfl: 5 .  DULoxetine (CYMBALTA) 60 MG capsule, TAKE 2 CAPSULES BY MOUTH DAILY, Disp: 180 capsule, Rfl: 4 .  fentaNYL (DURAGESIC) 12 MCG/HR, Place 1 patch onto the skin every 3 (three) days., Disp: 10 patch, Rfl: 0 .  fentaNYL (DURAGESIC) 75 MCG/HR, Place 1 patch onto the skin every 3 (three) days., Disp: 10 patch, Rfl: 0 .  fexofenadine (ALLEGRA) 180 MG tablet, Take 180 mg by mouth daily., Disp: , Rfl:  .  fluticasone (FLONASE) 50 MCG/ACT nasal spray, SPRAY 2 SPRAYS INTO EACH NOSTRIL EVERY DAY, Disp: 16 g, Rfl: 2 .  Fluticasone-Salmeterol (ADVAIR) 250-50 MCG/DOSE AEPB, Inhale 1 puff into the  lungs every 12 (twelve) hours. Rinse mouth after use, Disp: 60 each, Rfl: 11 .  gabapentin (NEURONTIN) 600 MG tablet, TAKE 2 TABLETS IN THE MORNING AND 3 TABLETS AT NIGHTTIME, Disp: 155 tablet, Rfl: 5 .  haloperidol (HALDOL) 0.5 MG tablet, Take 0.5 tablets (0.25 mg total) by mouth 2 (two) times daily., Disp: 31 tablet, Rfl: 5 .  loratadine (CLARITIN) 10 MG tablet, Take 10 mg by mouth daily., Disp: , Rfl:  .  ondansetron (ZOFRAN) 4 MG tablet, Take 1 tablet (4 mg total) by mouth daily as needed for nausea or vomiting., Disp: 30 tablet, Rfl: 1 .  ondansetron (ZOFRAN-ODT) 4 MG disintegrating tablet, DISSOLVE 1 TABLET IN MOUTH EVERY 8 HOURS FOR NAUSEA AND VOMITING, Disp: 30 tablet, Rfl: 5 .  pregabalin (LYRICA) 300 MG capsule, TAKE 1 CAPSULE BY MOUTH TWICE A DAY, Disp: 180 capsule, Rfl: 4 .  QUEtiapine (SEROQUEL) 200 MG tablet, TAKE 1 TABLET (200 MG TOTAL) BY MOUTH AT BEDTIME., Disp: 90 tablet, Rfl: 4 .  tiZANidine (ZANAFLEX) 4 MG tablet, Take 0.5-1 tablets (2-4 mg total) by mouth every 8 (eight) hours as needed for muscle spasms., Disp: 90 tablet, Rfl: 4 .  traMADol (ULTRAM) 50 MG tablet, Take 1 tablet (50 mg total) by mouth every 6 (six) hours as needed., Disp: 20 tablet, Rfl: 0

## 2019-08-01 NOTE — Telephone Encounter (Signed)
Called and spoke with pt letting her know the Rx was sent to pharmacy for her. Pt verbalized understanding. Nothing further needed.

## 2019-08-01 NOTE — Telephone Encounter (Signed)
Adderall refill e-sent 

## 2019-08-01 NOTE — Addendum Note (Signed)
Addended by: Judie Petit on: 08/01/2019 03:04 PM   Modules accepted: Orders

## 2019-08-01 NOTE — Telephone Encounter (Signed)
OK, that's fine to approve orthopedic referral for bilateral foot pain and ehlers-danlos syndrome

## 2019-08-14 ENCOUNTER — Ambulatory Visit: Payer: Self-pay | Admitting: *Deleted

## 2019-08-14 NOTE — Telephone Encounter (Signed)
  Reason for Disposition . Health Information question, no triage required and triager able to answer question  Answer Assessment - Initial Assessment Questions 1. REASON FOR CALL or QUESTION: "What is your reason for calling today?" or "How can I best help you?" or "What question do you have that I can help answer?"     Advice on testing  Protocols used: [place]

## 2019-08-14 NOTE — Telephone Encounter (Signed)
FYI

## 2019-08-14 NOTE — Telephone Encounter (Signed)
Pt's mother calling. States that pt is in her third day of body aches and headaches, no fever. No known exposure. Pt's mother wants to know if this is an appropriate time to get pt tested for COVID.   Return call to patient's mom.  Advised that she should go ahead and get her daughter and herself tested for covid 19. They live together.  She is advised on how to set up an appointment for testing. She is also advised to quarantine and depending on fever to do this for 10 days.  And to treat her symptoms with over the counter medications. She voiced understanding. Routing to [facility]

## 2019-08-19 ENCOUNTER — Ambulatory Visit: Payer: BC Managed Care – PPO | Attending: Internal Medicine

## 2019-08-19 DIAGNOSIS — Z20822 Contact with and (suspected) exposure to covid-19: Secondary | ICD-10-CM

## 2019-08-20 LAB — NOVEL CORONAVIRUS, NAA: SARS-CoV-2, NAA: NOT DETECTED

## 2019-08-29 ENCOUNTER — Other Ambulatory Visit: Payer: Self-pay | Admitting: Family Medicine

## 2019-08-29 MED ORDER — FENTANYL 12 MCG/HR TD PT72: 1.0000 | MEDICATED_PATCH | TRANSDERMAL | 0 refills | Status: DC

## 2019-08-29 MED ORDER — FENTANYL 75 MCG/HR TD PT72: 1.0000 | MEDICATED_PATCH | TRANSDERMAL | 0 refills | Status: DC

## 2019-08-29 NOTE — Telephone Encounter (Signed)
Copied from [place] [id number]. Topic: Quick Communication - Rx Refill/Question >> Aug 29, 2019  9:07 AM [name] wrote: Medication: fentaNYL (DURAGESIC) 12 MCG/HR,fentaNYL ([place]) 75 MCG/HR (Patient is completely out and would like her request expedited.)  Has the patient contacted their pharmacy?Yes (Agent: If no, request that the patient contact the pharmacy for the refill.) (Agent: If yes, when and what did the pharmacy advise?)Contact PCP  Preferred Pharmacy (with phone number or street name[id number] [place], [place]  Phone:  [phone] Fax:  [phone]     Agent: Please be advised that RX refills may take up to 3 business days. We ask that you follow-up with your pharmacy.

## 2019-08-29 NOTE — Telephone Encounter (Signed)
Requested medication (s) are due for refill today: yes  Requested medication (s) are on the active medication list: yes  Last refill:  07/27/2019  Future visit scheduled:no  Notes to clinic:  this refill cannot be delegated    Requested Prescriptions  Pending Prescriptions Disp Refills   fentaNYL (DURAGESIC) 12 MCG/HR 10 patch 0    Sig: Place 1 patch onto the skin every 3 (three) days.      Not Delegated - Analgesics:  Opioid Agonists Failed - 08/29/2019  9:10 AM      Failed - This refill cannot be delegated      Failed - Urine Drug Screen completed in last 360 days.      Failed - Valid encounter within last 6 months    Recent Outpatient Visits           1 year ago Mild persistent asthma with exacerbation   [facility] Birdie Sons, MD   1 year ago [place], [place]   1 year ago [place], [place]   1 year ago Dysfunction of both eustachian tubes   [place], [place]   2 years ago Chronic bilateral back pain, unspecified back location   [place], MD                fentaNYL (DURAGESIC) 75 MCG/HR 10 patch 0    Sig: Place 1 patch onto the skin every 3 (three) days.      Not Delegated - Analgesics:  Opioid Agonists Failed - 08/29/2019  9:10 AM      Failed - This refill cannot be delegated      Failed - Urine Drug Screen completed in last 360 days.      Failed - Valid encounter within last 6 months    Recent Outpatient Visits           1 year ago Mild persistent asthma with exacerbation   [facility] Birdie Sons, MD   1 year ago [place], [place]   1 year ago [place], [place]   1 year ago Dysfunction of both eustachian tubes   [place], [place]   2 years ago Chronic  bilateral back pain, unspecified back location   [place], Kirstie Peri, MD

## 2019-09-07 ENCOUNTER — Other Ambulatory Visit: Payer: Self-pay | Admitting: Family Medicine

## 2019-09-07 NOTE — Telephone Encounter (Signed)
Requested medication (s) are due for refill today: yes  Requested medication (s) are on the active medication list: yes  Last refill:  08/11/2019  Future visit scheduled: no  Notes to clinic:  no valid encounter within last 12 months   Requested Prescriptions  Pending Prescriptions Disp Refills   DEXILANT 30 MG capsule [Pharmacy Med Name: B[place] DR 30 MG CAPSULE] 30 capsule 11    Sig: TAKE [place]      Gastroenterology: Proton Pump Inhibitors Failed - 09/07/2019 12:43 AM      Failed - Valid encounter within last 12 months    Recent Outpatient Visits           1 year ago Mild persistent asthma with exacerbation   [facility] Birdie Sons, MD   1 year ago [place], [place]   1 year ago [place], [place]   1 year ago Dysfunction of both eustachian tubes   [place], Vickki Muff, [place]   2 years ago Chronic bilateral back pain, unspecified back location   [place], Kirstie Peri, MD                celecoxib (CELEBREX) 200 MG capsule [Pharmacy Med Name: CELECOXIB 200 MG CAPSULE] 60 capsule 11    Sig: TAKE 1 CAPSULE BY MOUTH TWICE A DAY      Analgesics:  COX2 Inhibitors Failed - 09/07/2019 12:43 AM      Failed - HGB in normal range and within 360 days    Hemoglobin  Date Value Ref Range Status  03/13/2018 13.3 12.0 - 16.0 g/dL Final   HGB  Date Value Ref Range Status  02/19/2014 13.6 12.0 - 16.0 g/dL Final          Failed - Cr in normal range and within 360 days    Creatinine  Date Value Ref Range Status  02/19/2014 0.82 0.60 - 1.30 mg/dL Final   Creatinine, Ser  Date Value Ref Range Status  10/28/2017 0.95 0.44 - 1.00 mg/dL Final          Failed - Valid encounter within last 12 months    Recent Outpatient Visits           1 year ago Mild persistent asthma with exacerbation   [facility] Birdie Sons, MD   1 year ago [place], [place]   1 year ago [place], [place]   1 year ago Dysfunction of both eustachian tubes   [place], [place]   2 years ago Chronic bilateral back pain, unspecified back location   [place], Kirstie Peri, MD              Passed - Patient is not pregnant

## 2019-09-09 ENCOUNTER — Telehealth: Payer: Self-pay | Admitting: Internal Medicine

## 2019-09-09 MED ORDER — AMPHETAMINE-DEXTROAMPHET ER 15 MG PO CP24: 15.0000 mg | ORAL_CAPSULE | ORAL | 0 refills | Status: DC

## 2019-09-09 NOTE — Telephone Encounter (Signed)
L.O.V. was on 08/10/2018 and no upcoming appointment, please advise.

## 2019-09-09 NOTE — Telephone Encounter (Signed)
Patient is requesting a refill on her Adderall XR 15mg .   Last OV: 03/18/19 with CY Next OV:03/19/20 with CY Last RX: 08/01/19 for 31 capsules   [facility] [place] [facility] [address].   Dr. Annamaria Boots, please advise if you are ok with this refill. Thanks!

## 2019-09-09 NOTE — Telephone Encounter (Signed)
Adderall refill e-ent

## 2019-09-25 ENCOUNTER — Other Ambulatory Visit: Payer: Self-pay | Admitting: Family Medicine

## 2019-09-25 NOTE — Telephone Encounter (Signed)
Requested medication (s) are due for refill today - no  Requested medication (s) are on the active medication list -yes  Future visit scheduled -yes  Last refill: 08/29/19  Notes to clinic: Patient is requesting refill on non delegated Rx- both patches. Sent for review of request  Requested Prescriptions  Pending Prescriptions Disp Refills   fentaNYL (DURAGESIC) 12 MCG/HR 10 patch 0    Sig: Place 1 patch onto the skin every 3 (three) days.      Not Delegated - Analgesics:  Opioid Agonists Failed - 09/25/2019  9:54 AM      Failed - This refill cannot be delegated      Failed - Urine Drug Screen completed in last 360 days.      Failed - Valid encounter within last 6 months    Recent Outpatient Visits           1 year ago Mild persistent asthma with exacerbation   [facility] Birdie Sons, MD   1 year ago [place], [place]   1 year ago [place], [place]   1 year ago Dysfunction of both eustachian tubes   [place], [place]   2 years ago Chronic bilateral back pain, unspecified back location   [place], MD       Future Appointments             In 4 weeks Caryn Section, Kirstie Peri, MD [facility]                Requested Prescriptions  Pending Prescriptions Disp Refills   fentaNYL (DURAGESIC) 12 MCG/HR 10 patch 0    Sig: Place 1 patch onto the skin every 3 (three) days.      Not Delegated - Analgesics:  Opioid Agonists Failed - 09/25/2019  9:54 AM      Failed - This refill cannot be delegated      Failed - Urine Drug Screen completed in last 360 days.      Failed - Valid encounter within last 6 months    Recent Outpatient Visits           1 year ago Mild persistent asthma with exacerbation   [facility] Birdie Sons, MD   1 year ago [place], [place]   1 year ago [place], [place]   1 year ago Dysfunction of both eustachian tubes   [place], [place]   2 years ago Chronic bilateral back pain, unspecified back location   [facility] Birdie Sons, MD       Future Appointments             In 4 weeks [place], Kirstie Peri, MD [facility], [place]

## 2019-09-25 NOTE — Telephone Encounter (Signed)
Copied from [place] [phone]. Topic: Quick Communication - Rx Refill/Question >> Sep 25, 2019  9:44 AM Yvette Rack wrote: Medication: fentaNYL (DURAGESIC) 12 MCG/HR and fentaNYL ([place]) 75 MCG/HR  Has the patient contacted their pharmacy? no  Preferred Pharmacy (with phone number or street name[id number] [name], [place]  Phone: [phone]  Fax: [phone]  Agent: Please be advised that RX refills may take up to 3 business days. We ask that you follow-up with your pharmacy.

## 2019-09-26 MED ORDER — FENTANYL 12 MCG/HR TD PT72: 1.0000 | MEDICATED_PATCH | TRANSDERMAL | 0 refills | Status: DC

## 2019-09-27 ENCOUNTER — Telehealth: Payer: Self-pay | Admitting: Family Medicine

## 2019-09-27 ENCOUNTER — Other Ambulatory Visit: Payer: Self-pay | Admitting: Family Medicine

## 2019-09-27 MED ORDER — FENTANYL 75 MCG/HR TD PT72: 1.0000 | MEDICATED_PATCH | TRANSDERMAL | 0 refills | Status: DC

## 2019-09-27 NOTE — Telephone Encounter (Signed)
Called pharmacy regarding pt's request for Duragesic patches. Pharmacy stated that one prescription was picked up (Duragesic 12 mcg) and the the 75 mcg was not refilled. Pt's pcp (on call provider) notified regarding this. Stated he will call in the 75 mcg.  Notified pt's mom that her pcp was notified and that she should be able to get her mediations. She voiced understanding.

## 2019-09-27 NOTE — Telephone Encounter (Signed)
Medication: fentaNYL ([place]) 12 MCG/HR [id number] & fentaNYL ([place]) 75 MCG/HR [id number] - Pharmacy did not receive the script. Can this be resent  Has the patient contacted their pharmacy? Yes  (Agent: If no, request that the patient contact the pharmacy for the refill.) (Agent: If yes, when and what did the pharmacy advise?)  Preferred Pharmacy (with phone number or street name[id number] [place], [place]  Phone:  [phone] Fax:  [phone]     Agent: Please be advised that RX refills may take up to 3 business days. We ask that you follow-up with your pharmacy.

## 2019-10-01 ENCOUNTER — Telehealth (INDEPENDENT_AMBULATORY_CARE_PROVIDER_SITE_OTHER): Payer: Self-pay | Admitting: Pediatrics

## 2019-10-01 ENCOUNTER — Telehealth: Payer: Self-pay | Admitting: Family Medicine

## 2019-10-01 NOTE — Telephone Encounter (Signed)
Spoke with Karen [name] and answered her qyestions

## 2019-10-01 NOTE — Telephone Encounter (Signed)
Patient's mother advised [facility] Caryn Section was aware patient is taking Gabapentin also. Medication is on patient's medication list as active.

## 2019-10-01 NOTE — Telephone Encounter (Signed)
  Who's calling (name and relationship to patient) : [facility]  Best contact number: [phone]  Provider they see: Dr. Gaynell Face   Reason for call: Bartolo Darter called with questions about an Rx  that was ordered. Please call back so questions could be discussed.     PRESCRIPTION REFILL ONLY  Name of prescription:  Pharmacy:

## 2019-10-01 NOTE — Telephone Encounter (Signed)
Copied from [place] [id number]. Topic: General - Other >> Oct 01, 2019  8:21 AM Keene Breath wrote: Reason for CRM: Called to request the nurse to call pharmacy because there is a problem with script for pregabalin (LYRICA) 300 MG capsule.   CB# [phone]

## 2019-10-07 ENCOUNTER — Telehealth: Payer: Self-pay

## 2019-10-07 NOTE — Telephone Encounter (Signed)
Copied from [place] [phone]. Topic: Referral - Request for Referral >> Oct 07, 2019  9:38 AM Sheran Luz wrote: Has patient seen PCP for this complaint? No *If NO, is insurance requiring patient see PCP for this issue before PCP can refer them? Referral for which specialty: Mammogram Preferred provider/office: [facility]  Reason for referral: found a lump in left breast

## 2019-10-07 NOTE — Telephone Encounter (Signed)
Requires an office visit to evaluate breast lump.  OK to see a PA or Sharyn Lull if my schedule is booked.

## 2019-10-07 NOTE — Telephone Encounter (Signed)
Patient scheduled with Sharyn Lull on 10/09/2019.

## 2019-10-09 ENCOUNTER — Encounter: Payer: Self-pay | Admitting: Adult Health

## 2019-10-09 ENCOUNTER — Ambulatory Visit (INDEPENDENT_AMBULATORY_CARE_PROVIDER_SITE_OTHER): Payer: Medicare Other | Admitting: Adult Health

## 2019-10-09 ENCOUNTER — Other Ambulatory Visit: Payer: Self-pay

## 2019-10-09 VITALS — BP 118/84 | HR 80 | Temp 96.9°F | Resp 16

## 2019-10-09 DIAGNOSIS — Z803 Family history of malignant neoplasm of breast: Secondary | ICD-10-CM

## 2019-10-09 DIAGNOSIS — R350 Frequency of micturition: Secondary | ICD-10-CM | POA: Insufficient documentation

## 2019-10-09 DIAGNOSIS — N6321 Unspecified lump in the left breast, upper outer quadrant: Secondary | ICD-10-CM | POA: Diagnosis not present

## 2019-10-09 DIAGNOSIS — N6011 Diffuse cystic mastopathy of right breast: Secondary | ICD-10-CM | POA: Diagnosis not present

## 2019-10-09 DIAGNOSIS — N6012 Diffuse cystic mastopathy of left breast: Secondary | ICD-10-CM

## 2019-10-09 DIAGNOSIS — Z79899 Other long term (current) drug therapy: Secondary | ICD-10-CM | POA: Diagnosis not present

## 2019-10-09 DIAGNOSIS — Z13220 Encounter for screening for lipoid disorders: Secondary | ICD-10-CM

## 2019-10-09 DIAGNOSIS — Z1329 Encounter for screening for other suspected endocrine disorder: Secondary | ICD-10-CM | POA: Diagnosis not present

## 2019-10-09 DIAGNOSIS — R3911 Hesitancy of micturition: Secondary | ICD-10-CM | POA: Diagnosis not present

## 2019-10-09 LAB — POCT URINALYSIS DIPSTICK
Bilirubin, UA: NEGATIVE
Glucose, UA: NEGATIVE
Ketones, UA: NEGATIVE
Nitrite, UA: NEGATIVE
Protein, UA: NEGATIVE
Spec Grav, UA: <=1.005 — AB (ref 1.010–1.025)
Urobilinogen, UA: 0.2 E.U./dL
pH, UA: 5 (ref 5.0–8.0)

## 2019-10-09 MED ORDER — SULFAMETHOXAZOLE-TRIMETHOPRIM 800-160 MG PO TABS: 1.0000 | ORAL_TABLET | Freq: Two times a day (BID) | ORAL | 0 refills | Status: DC

## 2019-10-09 NOTE — Patient Instructions (Addendum)
Mammogram A mammogram is an X-ray of the breasts that is done to check for changes that are not normal. This test can screen for and find any changes that may suggest breast cancer. Mammograms are regularly done on women. A man may have a mammogram if he has a lump or swelling in his breast. This test can also help to find other changes and variations in the breast. Tell a doctor:  About any allergies you have.  If you have breast implants.  If you have had previous breast disease, biopsy, or surgery.  If you are breastfeeding.  If you are younger than age 81.  If you have a family history of breast cancer.  Whether you are pregnant or may be pregnant. What are the risks? Generally, this is a safe procedure. However, problems may occur, including:  Exposure to radiation. Radiation levels are very low with this test.  The results being misinterpreted.  The need for further tests.  The inability of the mammogram to detect certain cancers. What happens before the procedure?  Have this test done about 1-2 weeks after your period. This is usually when your breasts are the least tender.  If you are visiting a new doctor or clinic, send any past mammogram images to your new doctor's office.  Wash your breasts and under your arms the day of the test.  Do not use deodorants, perfumes, lotions, or powders on the day of the test.  Take off any jewelry from your neck.  Wear clothes that you can change into and out of easily. What happens during the procedure?   You will undress from the waist up. You will put on a gown.  You will stand in front of the X-ray machine.  Each breast will be placed between two plastic or glass plates. The plates will press down on your breast for a few seconds. Try to stay as relaxed as possible. This does not cause any harm to your breasts. Any discomfort you feel will be very brief.  X-rays will be taken from different angles of each breast. The  procedure may vary among doctors and hospitals. What happens after the procedure?  The mammogram will be read by a specialist (radiologist).  You may need to do certain parts of the test again. This depends on the quality of the images.  Ask when your test results will be ready. Make sure you get your test results.  You may go back to your normal activities. Summary  A mammogram is a low energy X-ray of the breasts that is done to check for abnormal changes. A man may have this test if he has a lump or swelling in his breast.  Before the procedure, tell your doctor about any breast problems that you have had in the past.  Have this test done about 1-2 weeks after your period.  For the test, each breast will be placed between two plastic or glass plates. The plates will press down on your breast for a few seconds.  The mammogram will be read by a specialist (radiologist). Ask when your test results will be ready. Make sure you get your test results. This information is not intended to replace advice given to you by your health care provider. Make sure you discuss any questions you have with your health care provider. Document Revised: 03/22/2018 Document Reviewed: 03/22/2018 Elsevier Patient Education  2020 [place] Breast self-awareness is knowing how your breasts look and feel. Doing  breast self-awareness is important. It allows you to catch a breast problem early while it is still small and can be treated. All women should do breast self-awareness, including women who have had breast implants. Tell your doctor if you notice a change in your breasts. What you need:  A mirror.  A well-lit room. How to do a breast self-exam A breast self-exam is one way to learn what is normal for your breasts and to check for changes. To do a breast self-exam: Look for changes  1. Take off all the clothes above your waist. 2. Stand in front of a mirror in a room with  good lighting. 3. Put your hands on your hips. 4. Push your hands down. 5. Look at your breasts and nipples in the mirror to see if one breast or nipple looks different from the other. Check to see if: ? The shape of one breast is different. ? The size of one breast is different. ? There are wrinkles, dips, and bumps in one breast and not the other. 6. Look at each breast for changes in the skin, such as: ? Redness. ? Scaly areas. 7. Look for changes in your nipples, such as: ? Liquid around the nipples. ? Bleeding. ? Dimpling. ? Redness. ? A change in where the nipples are. Feel for changes  1. Lie on your back on the floor. 2. Feel each breast. To do this, follow these steps: ? Pick a breast to feel. ? Put the arm closest to that breast above your head. ? Use your other arm to feel the nipple area of your breast. Feel the area with the pads of your three middle fingers by making small circles with your fingers. For the first circle, press lightly. For the second circle, press harder. For the third circle, press even harder. ? Keep making circles with your fingers at the different pressures as you move down your breast. Stop when you feel your ribs. ? Move your fingers a little toward the center of your body. ? Start making circles with your fingers again, this time going up until you reach your collarbone. ? Keep making up-and-down circles until you reach your armpit. Remember to keep using the three pressures. ? Feel the other breast in the same way. 3. Sit or stand in the tub or shower. 4. With soapy water on your skin, feel each breast the same way you did in step 2 when you were lying on the floor. Write down what you find Writing down what you find can help you remember what to tell your doctor. Write down:  What is normal for each breast.  Any changes you find in each breast, including: ? The kind of changes you find. ? Whether you have pain. ? Size and location of any  lumps.  When you last had your menstrual period. General tips  Check your breasts every month.  If you are breastfeeding, the best time to check your breasts is after you feed your baby or after you use a breast pump.  If you get menstrual periods, the best time to check your breasts is 5-7 days after your menstrual period is over.  With time, you will become comfortable with the self-exam, and you will begin to know if there are changes in your breasts. Contact a doctor if you:  See a change in the shape or size of your breasts or nipples.  See a change in the skin of your breast or  nipples, such as red or scaly skin.  Have fluid coming from your nipples that is not normal.  Find a lump or thick area that was not there before.  Have pain in your breasts.  Have any concerns about your breast health. Summary  Breast self-awareness includes looking for changes in your breasts, as well as feeling for changes within your breasts.  Breast self-awareness should be done in front of a mirror in a well-lit room.  You should check your breasts every month. If you get menstrual periods, the best time to check your breasts is 5-7 days after your menstrual period is over.  Let your doctor know of any changes you see in your breasts, including changes in size, changes on the skin, pain or tenderness, or fluid from your nipples that is not normal. This information is not intended to replace advice given to you by your health care provider. Make sure you discuss any questions you have with your health care provider. Document Revised: 03/20/2018 Document Reviewed: 03/20/2018 Elsevier Patient Education  [place].  Urinary Tract Infection, Adult A urinary tract infection (UTI) is an infection of any part of the urinary tract. The urinary tract includes:  The kidneys.  The ureters.  The bladder.  The urethra. These organs make, store, and get rid of pee (urine) in the  body. What are the causes? This is caused by germs (bacteria) in your genital area. These germs grow and cause swelling (inflammation) of your urinary tract. What increases the risk? You are more likely to develop this condition if:  You have a small, thin tube (catheter) to drain pee.  You cannot control when you pee or poop (incontinence).  You are female, and: ? You use these methods to prevent pregnancy:  A medicine that kills sperm (spermicide).  A device that blocks sperm (diaphragm). ? You have low levels of a female hormone (estrogen). ? You are pregnant.  You have genes that add to your risk.  You are sexually active.  You take antibiotic medicines.  You have trouble peeing because of: ? A prostate that is bigger than normal, if you are female. ? A blockage in the part of your body that drains pee from the bladder (urethra). ? A kidney stone. ? A nerve condition that affects your bladder (neurogenic bladder). ? Not getting enough to drink. ? Not peeing often enough.  You have other conditions, such as: ? Diabetes. ? A weak disease-fighting system (immune system). ? Sickle cell disease. ? Gout. ? Injury of the spine. What are the signs or symptoms? Symptoms of this condition include:  Needing to pee right away (urgently).  Peeing often.  Peeing small amounts often.  Pain or burning when peeing.  Blood in the pee.  Pee that smells bad or not like normal.  Trouble peeing.  Pee that is cloudy.  Fluid coming from the vagina, if you are female.  Pain in the belly or lower back. Other symptoms include:  Throwing up (vomiting).  No urge to eat.  Feeling mixed up (confused).  Being tired and grouchy (irritable).  A fever.  Watery poop (diarrhea). How is this treated? This condition may be treated with:  Antibiotic medicine.  Other medicines.  Drinking enough water. Follow these instructions at home:  Medicines  Take over-the-counter  and prescription medicines only as told by your doctor.  If you were prescribed an antibiotic medicine, take it as told by your doctor. Do not stop taking  it even if you start to feel better. General instructions  Make sure you: ? Pee until your bladder is empty. ? Do not hold pee for a long time. ? Empty your bladder after sex. ? Wipe from front to back after pooping if you are a female. Use each tissue one time when you wipe.  Drink enough fluid to keep your pee pale yellow.  Keep all follow-up visits as told by your doctor. This is important. Contact a doctor if:  You do not get better after 1-2 days.  Your symptoms go away and then come back. Get help right away if:  You have very bad back pain.  You have very bad pain in your lower belly.  You have a fever.  You are sick to your stomach (nauseous).  You are throwing up. Summary  A urinary tract infection (UTI) is an infection of any part of the urinary tract.  This condition is caused by germs in your genital area.  There are many risk factors for a UTI. These include having a small, thin tube to drain pee and not being able to control when you pee or poop.  Treatment includes antibiotic medicines for germs.  Drink enough fluid to keep your pee pale yellow. This information is not intended to replace advice given to you by your health care provider. Make sure you discuss any questions you have with your health care provider. Document Revised: 07/19/2018 Document Reviewed: 02/08/2018 Elsevier Patient Education  2020 [employer].

## 2019-10-09 NOTE — Progress Notes (Signed)
Patient: Karen [name] Female    DOB: October 30, 1975   44 y.o.   MRN: [id number] Visit Date: 10/09/2019  Today's Provider: Marcille Buffy, FNP   Chief Complaint  Patient presents with  . Breast Mass   Subjective:     HPI Patient comes in office today to address concerns of a possible lump that she has discovered in the past two weeks on the left side of her breast above the nipple.This is non painful.  Patient denies area being tender to the touch and describes mass as small and states that she is able to pick it up and move it. Patient reports that she has a family history of breast cancer and has yet to have a mammogram.   Patient also states that she has had another lump on the same breast on the lower left side of left breast that she states has been present for 20 years.  Denies any nipple discharge or skin dimpling. Denies any pain. Denies rash.  She has never had a mammogram.   She has an appointment march 10 th with Dr. Caryn Section. She lost a 75 mcg  Fentanyl  patch in shower she reports and has some left will just end up short one patch. She will let us know when she gets on her last patch.   She also has frequent urination, denies any other symptoms. Denies polyphagia or increased thirst. She has had some urgency and frequency. Denies any burning. She has had these symptoms for around one month. Denies fever, chills, back or flank pain or hematuria. Denies any vaginal symptoms.    No LMP recorded. Patient is postmenopausal.    She has chronic nausea with pain of her chronic conditions. No change.  Patient  denies any fever, body aches,chills,chest pain, shortness of breath, nausea, vomiting, or diarrhea.   Allergies  Allergen Reactions  . Chocolate Flavor     Other reaction(s): Other (See Comments) Wheezing, acne  . Demerol [Meperidine] Other (See Comments)    Slow to wake up when this drug is given.   Marland Kitchen Keflex [Cephalexin] Nausea And Vomiting  . Morphine  And Related Nausea And Vomiting  . Toradol [Ketorolac Tromethamine] Nausea And Vomiting  . Augmentin  [Amoxicillin-Pot Clavulanate]   . Chocolate     GI distress  . Nsaids     patient develops ulcers  . Strawberry Extract     GI distress  . Tape Dermatitis     Current Outpatient Medications:  .  albuterol (VENTOLIN HFA) 108 (90 Base) MCG/ACT inhaler, Inhale 2 puffs into the lungs every 6 (six) hours as needed., Disp: 8 g, Rfl: 12 .  amphetamine-dextroamphetamine (ADDERALL XR) 15 MG 24 hr capsule, Take 1 capsule by mouth every morning., Disp: 31 capsule, Rfl: 0 .  busPIRone (BUSPAR) 10 MG tablet, TAKE 1 TABLET BY MOUTH TWICE A DAY, Disp: 180 tablet, Rfl: 1 .  celecoxib (CELEBREX) 200 MG capsule, TAKE 1 CAPSULE BY MOUTH TWICE A DAY, Disp: 60 capsule, Rfl: 11 .  DEXILANT 30 MG capsule, TAKE 1 CAPSULE BY MOUTH EVERY DAY, Disp: 30 capsule, Rfl: 11 .  diclofenac sodium (VOLTAREN) 1 % GEL, APPLY AS NEEDED 3 TIMES A DAY, Disp: 100 g, Rfl: 5 .  DULoxetine (CYMBALTA) 60 MG capsule, TAKE 2 CAPSULES BY MOUTH DAILY, Disp: 180 capsule, Rfl: 4 .  fentaNYL (DURAGESIC) 12 MCG/HR, Place 1 patch onto the skin every 3 (three) days., Disp: 10 patch, Rfl: 0 .  fentaNYL (DURAGESIC) 75 MCG/HR, Place 1 patch onto the skin every 3 (three) days., Disp: 10 patch, Rfl: 0 .  fexofenadine (ALLEGRA) 180 MG tablet, Take 180 mg by mouth daily., Disp: , Rfl:  .  fluticasone (FLONASE) 50 MCG/ACT nasal spray, SPRAY 2 SPRAYS INTO EACH NOSTRIL EVERY DAY, Disp: 16 g, Rfl: 2 .  Fluticasone-Salmeterol (ADVAIR) 250-50 MCG/DOSE AEPB, Inhale 1 puff into the lungs every 12 (twelve) hours. Rinse mouth after use, Disp: 60 each, Rfl: 11 .  gabapentin (NEURONTIN) 600 MG tablet, TAKE 2 TABLETS IN THE MORNING AND 3 TABLETS AT NIGHTTIME, Disp: 155 tablet, Rfl: 5 .  haloperidol (HALDOL) 0.5 MG tablet, Take 0.5 tablets (0.25 mg total) by mouth 2 (two) times daily., Disp: 31 tablet, Rfl: 5 .  loratadine (CLARITIN) 10 MG tablet, Take 10 mg by  mouth daily., Disp: , Rfl:  .  ondansetron (ZOFRAN) 4 MG tablet, Take 1 tablet (4 mg total) by mouth daily as needed for nausea or vomiting., Disp: 30 tablet, Rfl: 1 .  ondansetron (ZOFRAN-ODT) 4 MG disintegrating tablet, DISSOLVE 1 TABLET IN MOUTH EVERY 8 HOURS FOR NAUSEA AND VOMITING, Disp: 30 tablet, Rfl: 5 .  pregabalin (LYRICA) 300 MG capsule, TAKE 1 CAPSULE BY MOUTH TWICE A DAY, Disp: 180 capsule, Rfl: 4 .  QUEtiapine (SEROQUEL) 200 MG tablet, TAKE 1 TABLET (200 MG TOTAL) BY MOUTH AT BEDTIME., Disp: 90 tablet, Rfl: 4 .  tiZANidine (ZANAFLEX) 4 MG tablet, Take 0.5-1 tablets (2-4 mg total) by mouth every 8 (eight) hours as needed for muscle spasms., Disp: 90 tablet, Rfl: 4 .  traMADol (ULTRAM) 50 MG tablet, Take 1 tablet (50 mg total) by mouth every 6 (six) hours as needed., Disp: 20 tablet, Rfl: 0  Review of Systems  Constitutional: Negative.  Negative for appetite change, chills, fatigue and fever.  HENT: Negative for congestion, dental problem, ear pain, postnasal drip, rhinorrhea, sinus pressure and sore throat.   Respiratory: Negative.  Negative for chest tightness.   Cardiovascular: Negative.  Negative for chest pain and palpitations.       Left breast lump above left nipple present x 2  Gastrointestinal: Negative for abdominal pain, nausea and vomiting.  Endocrine: Positive for polyuria. Negative for cold intolerance, heat intolerance, polydipsia and polyphagia.  Musculoskeletal: Positive for arthralgias. Negative for back pain, gait problem, joint swelling, myalgias, neck pain and neck stiffness.  Neurological: Negative for dizziness and weakness.  Psychiatric/Behavioral: Negative.     Social History   Tobacco Use  . Smoking status: Never Smoker  . Smokeless tobacco: Never Used  Substance Use Topics  . Alcohol use: No    Alcohol/week: 0.0 standard drinks      Objective:  Blood pressure 118/84, pulse 80, temperature (!) 96.9 F (36.1 C), temperature source temporal  resp.  rate 16, SpO2 99 %.   Physical Exam Vitals reviewed.  Constitutional:      General: She is not in acute distress.    Appearance: She is not ill-appearing, toxic-appearing or diaphoretic.     Comments: In motorized wheelchair   HENT:     Head: Normocephalic and atraumatic.  Cardiovascular:     Rate and Rhythm: Normal rate and regular rhythm.     Pulses: Normal pulses.     Heart sounds: Normal heart sounds. No murmur. No friction rub. No gallop.   Pulmonary:     Effort: Pulmonary effort is normal. No respiratory distress.     Breath sounds: Normal breath sounds. No stridor. No wheezing, rhonchi  or rales.  Chest:     Chest wall: No mass or tenderness.     Breasts: Tanner Score is 5.        Right: No swelling, bleeding, inverted nipple, mass, nipple discharge, skin change or tenderness.        Left: Mass present. No swelling, bleeding, inverted nipple, nipple discharge, skin change or tenderness.       Comments: approximately 1 cm x 1 cm soft mobile mass, non tender. No skin rash. No nipple discharge or skin dimpling.  Bilateral breast are both lumpy and suspect fibrocystic changes. Abdominal:     General: There is no distension.     Palpations: Abdomen is soft. There is no mass.     Tenderness: There is no abdominal tenderness. There is no right CVA tenderness, left CVA tenderness, guarding or rebound.     Hernia: No hernia is present.  Musculoskeletal:     Cervical back: Normal range of motion and neck supple.  Lymphadenopathy:     Cervical: No cervical adenopathy.     Upper Body:     Right upper body: No supraclavicular or axillary adenopathy.     Left upper body: No supraclavicular or axillary adenopathy.  Skin:    General: Skin is warm and dry.     Capillary Refill: Capillary refill takes less than 2 seconds.     Findings: No rash.  Neurological:     Mental Status: She is alert and oriented to person, place, and time.  Psychiatric:        Mood and Affect: Mood normal.         Behavior: Behavior normal.        Thought Content: Thought content normal.        Judgment: Judgment normal.      No results found for any visits on 10/09/19.     Assessment & Plan      ICD-10-CM   1. Breast lump on left side at 1 o'clock position  N63.21 MM DIAG BREAST TOMO BILATERAL    CBC with Differential/Platelet    Comprehensive Metabolic Panel (CMET)  2. Family history of breast cancer  Z80.3 MM DIAG BREAST TOMO BILATERAL  3. Fibrocystic breast changes, bilateral  N60.11 MM DIAG BREAST TOMO BILATERAL   N60.12   4. Frequent urination  R35.0 CBC with Differential/Platelet    Comprehensive Metabolic Panel (CMET)    TSH  5. Encounter for long-term (current) use of high-risk medication  Z79.899 CBC with Differential/Platelet    Comprehensive Metabolic Panel (CMET)    TSH    Lipid Panel w/o Chol/HDL Ratio  6. Screening cholesterol level  Z13.220 Lipid Panel w/o Chol/HDL Ratio  7. Thyroid disorder screening  Z13.29 TSH  8. Urinary frequency  R35.0 POCT urinalysis dipstick    Urine Culture   Suspect urinary tract infection with moderate leucocytes and trace blood on POCT urine . Will treat as below. Send for culture. Patient to call if any symptoms changing or worsening at anytime. Signs of RED FLAGS for pyelonephritis and other urinary diagnosis was discussed.  Meds ordered this encounter  Medications  . sulfamethoxazole-trimethoprim (BACTRIM DS) 800-160 MG tablet    Sig: Take 1 tablet by mouth 2 (two) times daily.    Dispense:  10 tablet    Refill:  0    Return in about 1 month (around 11/06/2019), or if symptoms worsen or fail to improve, for at any time for any worsening symptoms, Go to Emergency room/ urgent  care if worse. She will keep her follow up with Dr. Caryn Section her PCP March 10th, she requested provider to go ahead and order her lab work so she could return  Fasting for labs prior to that appointment.    The entirety of the information documented in the  History of Present Illness, Review of Systems and Physical Exam were personally obtained by me. Portions of this information were initially documented by the  Certified Medical Assistant whose name is documented in [place] and reviewed by me for thoroughness and accuracy.  I have personally performed the exam and reviewed the chart and it is accurate to the best of my knowledge.  Haematologist has been used and any errors in dictation or transcription are unintentional.  [facility]  Addressed extensive list of chronic and acute medical problems today requiring 55 minutes reviewing her medical record, counseling patient regarding her conditions and coordination of care.    There are other unrelated non-urgent complaints, but due to the busy schedule and the amount of time I've already spent with her, time does not permit me to address these routine issues at today's visit. I've requested another appointment to review these additional issues.  Marcille Buffy, [facility]

## 2019-10-09 NOTE — Progress Notes (Signed)
Will send for culture. Bactrim given for 5 days.

## 2019-10-11 LAB — URINE CULTURE

## 2019-10-11 NOTE — Progress Notes (Signed)
Urine Culture has e-coli. She is on correct Antibiotic. Return / reach out to office if any symptoms persist, change or worsen.

## 2019-10-15 DIAGNOSIS — Z13220 Encounter for screening for lipoid disorders: Secondary | ICD-10-CM | POA: Diagnosis not present

## 2019-10-15 DIAGNOSIS — Z1329 Encounter for screening for other suspected endocrine disorder: Secondary | ICD-10-CM | POA: Diagnosis not present

## 2019-10-15 DIAGNOSIS — N6321 Unspecified lump in the left breast, upper outer quadrant: Secondary | ICD-10-CM | POA: Diagnosis not present

## 2019-10-15 DIAGNOSIS — Z79899 Other long term (current) drug therapy: Secondary | ICD-10-CM | POA: Diagnosis not present

## 2019-10-15 DIAGNOSIS — R350 Frequency of micturition: Secondary | ICD-10-CM | POA: Diagnosis not present

## 2019-10-16 LAB — COMPREHENSIVE METABOLIC PANEL
ALT: 8 IU/L (ref 0–32)
AST: 22 IU/L (ref 0–40)
Albumin/Globulin Ratio: 2.5 — ABNORMAL HIGH (ref 1.2–2.2)
Albumin: 4.5 g/dL (ref 3.8–4.8)
Alkaline Phosphatase: 110 IU/L (ref 39–117)
BUN/Creatinine Ratio: 8 — ABNORMAL LOW (ref 9–23)
BUN: 8 mg/dL (ref 6–24)
Bilirubin Total: <0.2 mg/dL (ref 0.0–1.2)
CO2: 25 mmol/L (ref 20–29)
Calcium: 9 mg/dL (ref 8.7–10.2)
Chloride: 103 mmol/L (ref 96–106)
Creatinine, Ser: 1 mg/dL (ref 0.57–1.00)
GFR calc Af Amer: 80 mL/min/{1.73_m2} (ref >59)
GFR calc non Af Amer: 70 mL/min/{1.73_m2} (ref >59)
Globulin, Total: 1.8 g/dL (ref 1.5–4.5)
Glucose: 76 mg/dL (ref 65–99)
Potassium: 5 mmol/L (ref 3.5–5.2)
Sodium: 140 mmol/L (ref 134–144)
Total Protein: 6.3 g/dL (ref 6.0–8.5)

## 2019-10-16 LAB — CBC WITH DIFFERENTIAL/PLATELET
Basophils Absolute: 0 10*3/uL (ref 0.0–0.2)
Basos: 1 %
EOS (ABSOLUTE): 0.3 10*3/uL (ref 0.0–0.4)
Eos: 6 %
Hematocrit: 37.9 % (ref 34.0–46.6)
Hemoglobin: 12.8 g/dL (ref 11.1–15.9)
Immature Grans (Abs): 0 10*3/uL (ref 0.0–0.1)
Immature Granulocytes: 0 %
Lymphocytes Absolute: 2.7 10*3/uL (ref 0.7–3.1)
Lymphs: 49 %
MCH: 28.8 pg (ref 26.6–33.0)
MCHC: 33.8 g/dL (ref 31.5–35.7)
MCV: 85 fL (ref 79–97)
Monocytes Absolute: 0.3 10*3/uL (ref 0.1–0.9)
Monocytes: 5 %
Neutrophils Absolute: 2.2 10*3/uL (ref 1.4–7.0)
Neutrophils: 39 %
Platelets: 256 10*3/uL (ref 150–450)
RBC: 4.44 x10E6/uL (ref 3.77–5.28)
RDW: 13.3 % (ref 11.7–15.4)
WBC: 5.6 10*3/uL (ref 3.4–10.8)

## 2019-10-16 LAB — LIPID PANEL W/O CHOL/HDL RATIO
Cholesterol, Total: 189 mg/dL (ref 100–199)
HDL: 70 mg/dL (ref >39)
LDL Chol Calc (NIH): 108 mg/dL — ABNORMAL HIGH (ref 0–99)
Triglycerides: 57 mg/dL (ref 0–149)
VLDL Cholesterol Cal: 11 mg/dL (ref 5–40)

## 2019-10-16 LAB — TSH: TSH: 2.44 u[IU]/mL (ref 0.450–4.500)

## 2019-10-16 NOTE — Progress Notes (Signed)
LDL mildly elevated at 108.  Discuss lifestyle modification with patient e.g. increase exercise, fiber, fruits, vegetables, lean meat, and omega 3/fish intake and decrease saturated fat.   Thyroid, liver, kidney function labs within normal range. No anemia or signs of infection.  Keep follow up appointments with Dr. Caryn Section and follow up as needed.

## 2019-10-22 ENCOUNTER — Other Ambulatory Visit: Payer: Self-pay | Admitting: Adult Health

## 2019-10-22 DIAGNOSIS — N6012 Diffuse cystic mastopathy of left breast: Secondary | ICD-10-CM

## 2019-10-22 DIAGNOSIS — N6321 Unspecified lump in the left breast, upper outer quadrant: Secondary | ICD-10-CM

## 2019-10-22 DIAGNOSIS — Z803 Family history of malignant neoplasm of breast: Secondary | ICD-10-CM

## 2019-10-22 DIAGNOSIS — N6011 Diffuse cystic mastopathy of right breast: Secondary | ICD-10-CM

## 2019-10-22 NOTE — Progress Notes (Signed)
Patient: Karen [name] Female    DOB: September 23, 1975   44 y.o.   MRN: [id number] Visit Date: 10/23/2019  Today's Provider: Lelon Huh, MD   Chief Complaint  Patient presents with  . Pain   Subjective:     HPI   Follow up for Chronic back pain:  The patient was last seen for this more than 1 year ago. Changes made at last visit include increasing Fentanyl patch to 87.5 mcg due to exacerbated pain  She reports good compliance with treatment. She feels that condition is Improved. She is not having side effects.   She was treated for e.coli UTI about 2 weeks ago with trimethoprim-sulfa. has completed antibiotic, however she has had urinary incontinence that has no improved and she wants to get back in with her urologist.  ------------------------------------------------------------------------------------  Allergies  Allergen Reactions  . Chocolate Flavor     Other reaction(s): Other (See Comments) Wheezing, acne  . Demerol [Meperidine] Other (See Comments)    Slow to wake up when this drug is given.   Marland Kitchen Keflex [Cephalexin] Nausea And Vomiting  . Morphine And Related Nausea And Vomiting  . Toradol [Ketorolac Tromethamine] Nausea And Vomiting  . Augmentin  [Amoxicillin-Pot Clavulanate]   . Chocolate     GI distress  . Nsaids     patient develops ulcers  . Strawberry Extract     GI distress  . Tape Dermatitis     Current Outpatient Medications:  .  albuterol (VENTOLIN HFA) 108 (90 Base) MCG/ACT inhaler, Inhale 2 puffs into the lungs every 6 (six) hours as needed., Disp: 8 g, Rfl: 12 .  amphetamine-dextroamphetamine (ADDERALL XR) 15 MG 24 hr capsule, Take 1 capsule by mouth every morning., Disp: 31 capsule, Rfl: 0 .  busPIRone (BUSPAR) 10 MG tablet, TAKE 1 TABLET BY MOUTH TWICE A DAY, Disp: 180 tablet, Rfl: 1 .  celecoxib (CELEBREX) 200 MG capsule, TAKE 1 CAPSULE BY MOUTH TWICE A DAY, Disp: 60 capsule, Rfl: 11 .  DEXILANT 30 MG capsule, TAKE 1 CAPSULE BY  MOUTH EVERY DAY, Disp: 30 capsule, Rfl: 11 .  diclofenac Sodium (VOLTAREN) 1 % GEL, APPLY AS NEEDED 3 TIMES A DAY, Disp: 100 g, Rfl: 0 .  DULoxetine (CYMBALTA) 60 MG capsule, TAKE 2 CAPSULES BY MOUTH DAILY, Disp: 180 capsule, Rfl: 4 .  fentaNYL (DURAGESIC) 12 MCG/HR, Place 1 patch onto the skin every 3 (three) days., Disp: 10 patch, Rfl: 0 .  fentaNYL (DURAGESIC) 75 MCG/HR, Place 1 patch onto the skin every 3 (three) days., Disp: 10 patch, Rfl: 0 .  fexofenadine (ALLEGRA) 180 MG tablet, Take 180 mg by mouth daily., Disp: , Rfl:  .  fluticasone (FLONASE) 50 MCG/ACT nasal spray, SPRAY 2 SPRAYS INTO EACH NOSTRIL EVERY DAY, Disp: 16 g, Rfl: 2 .  Fluticasone-Salmeterol (ADVAIR) 250-50 MCG/DOSE AEPB, Inhale 1 puff into the lungs every 12 (twelve) hours. Rinse mouth after use, Disp: 60 each, Rfl: 11 .  gabapentin (NEURONTIN) 600 MG tablet, TAKE 2 TABLETS IN THE MORNING AND 3 TABLETS AT NIGHTTIME, Disp: 155 tablet, Rfl: 5 .  haloperidol (HALDOL) 0.5 MG tablet, Take 0.5 tablets (0.25 mg total) by mouth 2 (two) times daily., Disp: 31 tablet, Rfl: 5 .  loratadine (CLARITIN) 10 MG tablet, Take 10 mg by mouth daily., Disp: , Rfl:  .  ondansetron (ZOFRAN) 4 MG tablet, Take 1 tablet (4 mg total) by mouth daily as needed for nausea or vomiting., Disp: 30 tablet, Rfl:  1 .  ondansetron (ZOFRAN-ODT) 4 MG disintegrating tablet, DISSOLVE 1 TABLET IN MOUTH EVERY 8 HOURS FOR NAUSEA AND VOMITING, Disp: 30 tablet, Rfl: 5 .  pregabalin (LYRICA) 300 MG capsule, TAKE 1 CAPSULE BY MOUTH TWICE A DAY, Disp: 180 capsule, Rfl: 4 .  QUEtiapine (SEROQUEL) 200 MG tablet, TAKE 1 TABLET (200 MG TOTAL) BY MOUTH AT BEDTIME., Disp: 90 tablet, Rfl: 4 .  tiZANidine (ZANAFLEX) 4 MG tablet, Take 0.5-1 tablets (2-4 mg total) by mouth every 8 (eight) hours as needed for muscle spasms., Disp: 90 tablet, Rfl: 4  Review of Systems  Constitutional: Negative for appetite change, chills, fatigue and fever.  Respiratory: Negative for chest tightness  and shortness of breath.   Cardiovascular: Negative for chest pain and palpitations.  Gastrointestinal: Negative for abdominal pain, nausea and vomiting.  Musculoskeletal: Positive for back pain.  Neurological: Negative for dizziness and weakness.    Social History   Tobacco Use  . Smoking status: Never Smoker  . Smokeless tobacco: Never Used  Substance Use Topics  . Alcohol use: No    Alcohol/week: 0.0 standard drinks      Objective:   BP 102/68 (BP Location: Left Arm, Patient Position: Sitting, Cuff Size: Normal)   Pulse 73   Temp (!) 97.1 F (36.2 C) (Temporal)   Resp 16   SpO2 96% Comment: room air Vitals:   10/23/19 1115  BP: 102/68  Pulse: 73  Resp: 16  Temp: (!) 97.1 F (36.2 C)  TempSrc: Temporal  SpO2: 96%  There is no height or weight on file to calculate BMI.   Physical Exam   General: Appearance:     Well developed, well nourished female in no acute distress  Eyes:    PERRL, conjunctiva/corneas clear, EOM's intact       Lungs:     Clear to auscultation bilaterally, respirations unlabored  Heart:    Normal heart rate. Normal rhythm. No murmurs, rubs, or gallops.   MS:   All extremities are intact.   Neurologic:   Awake, alert, oriented x 3. No apparent focal neurological           defect.            Assessment & Plan    1. Rheumatoid arthritis, involving unspecified site, unspecified whether rheumatoid factor present (HCC) Stable, pain adequately controlled without adverse effects.   2. Seizure disorder ([place]) Completely controlled on current medication.   3. Urinary incontinence, unspecified type Persists after treatment of UTI while other sx have resolved.  - Ambulatory referral to Urology  4. Urinary frequency  - Ambulatory referral to Urology    The entirety of the information documented in the History of Present Illness, Review of Systems and Physical Exam were personally obtained by me. Portions of this information were initially  documented by Meyer Cory, CMA and reviewed by me for thoroughness and accuracy.    Lelon Huh, MD  [facility]

## 2019-10-23 ENCOUNTER — Telehealth: Payer: Self-pay | Admitting: Internal Medicine

## 2019-10-23 ENCOUNTER — Ambulatory Visit (INDEPENDENT_AMBULATORY_CARE_PROVIDER_SITE_OTHER): Payer: Medicare Other | Admitting: Family Medicine

## 2019-10-23 ENCOUNTER — Other Ambulatory Visit: Payer: Self-pay | Admitting: Family Medicine

## 2019-10-23 ENCOUNTER — Other Ambulatory Visit: Payer: Self-pay

## 2019-10-23 ENCOUNTER — Encounter: Payer: Self-pay | Admitting: Family Medicine

## 2019-10-23 VITALS — BP 102/68 | HR 73 | Temp 97.1°F | Resp 16

## 2019-10-23 DIAGNOSIS — R32 Unspecified urinary incontinence: Secondary | ICD-10-CM

## 2019-10-23 DIAGNOSIS — R350 Frequency of micturition: Secondary | ICD-10-CM | POA: Diagnosis not present

## 2019-10-23 DIAGNOSIS — G40909 Epilepsy, unspecified, not intractable, without status epilepticus: Secondary | ICD-10-CM

## 2019-10-23 DIAGNOSIS — M069 Rheumatoid arthritis, unspecified: Secondary | ICD-10-CM

## 2019-10-23 NOTE — Patient Instructions (Signed)
.   Please review the attached list of medications and notify my office if there are any errors.   . Please bring all of your medications to every appointment so we can make sure that our medication list is the same as yours.   

## 2019-10-23 NOTE — Telephone Encounter (Signed)
Requested Prescriptions  Pending Prescriptions Disp Refills  . diclofenac Sodium (VOLTAREN) 1 % GEL [Pharmacy Med Name: DICLOFENAC SODIUM 1% GEL] 100 g 0    Sig: APPLY AS NEEDED 3 TIMES A DAY     Analgesics:  Topicals Passed - 10/23/2019  1:31 AM      Passed - Valid encounter within last 12 months    Recent Outpatient Visits          2 weeks ago Breast lump on left side at 1 o'clock position   [place], FNP   1 year ago Mild persistent asthma with exacerbation   [facility] Birdie Sons, MD   1 year ago [place] [facility] [place], [place]   1 year ago [place], [place]   1 year ago Dysfunction of both eustachian tubes   [place], Vickki Muff, [place]      Future Appointments            Today Fisher, Kirstie Peri, MD [facility], [place]

## 2019-10-23 NOTE — Telephone Encounter (Signed)
Called and spoke with Patient.  Patient requested a new prescription for Adderall  To be sent to [place], [place].    Last refill #31, no refills 09/09/19  Message routed to Dr. Annamaria Boots  Allergies  Allergen Reactions  . Chocolate Flavor     Other reaction(s): Other (See Comments) Wheezing, acne  . Demerol [Meperidine] Other (See Comments)    Slow to wake up when this drug is given.   Marland Kitchen Keflex [Cephalexin] Nausea And Vomiting  . Morphine And Related Nausea And Vomiting  . Toradol [Ketorolac Tromethamine] Nausea And Vomiting  . Augmentin  [Amoxicillin-Pot Clavulanate]   . Chocolate     GI distress  . Nsaids     patient develops ulcers  . Strawberry Extract     GI distress  . Tape Dermatitis   Current Outpatient Medications on File Prior to Visit  Medication Sig Dispense Refill  . albuterol (VENTOLIN HFA) 108 (90 Base) MCG/ACT inhaler Inhale 2 puffs into the lungs every 6 (six) hours as needed. 8 g 12  . amphetamine-dextroamphetamine (ADDERALL XR) 15 MG 24 hr capsule Take 1 capsule by mouth every morning. 31 capsule 0  . busPIRone (BUSPAR) 10 MG tablet TAKE 1 TABLET BY MOUTH TWICE A DAY 180 tablet 1  . celecoxib (CELEBREX) 200 MG capsule TAKE 1 CAPSULE BY MOUTH TWICE A DAY 60 capsule 11  . DEXILANT 30 MG capsule TAKE 1 CAPSULE BY MOUTH EVERY DAY 30 capsule 11  . diclofenac Sodium (VOLTAREN) 1 % GEL APPLY AS NEEDED 3 TIMES A DAY 100 g 0  . DULoxetine (CYMBALTA) 60 MG capsule TAKE 2 CAPSULES BY MOUTH DAILY 180 capsule 4  . fentaNYL (DURAGESIC) 12 MCG/HR Place 1 patch onto the skin every 3 (three) days. 10 patch 0  . fentaNYL (DURAGESIC) 75 MCG/HR Place 1 patch onto the skin every 3 (three) days. 10 patch 0  . fexofenadine (ALLEGRA) 180 MG tablet Take 180 mg by mouth daily.    . fluticasone (FLONASE) 50 MCG/ACT nasal spray SPRAY 2 SPRAYS INTO EACH NOSTRIL EVERY DAY 16 g 2  . Fluticasone-Salmeterol (ADVAIR) 250-50 MCG/DOSE AEPB Inhale 1 puff into the lungs every 12 (twelve)  hours. Rinse mouth after use 60 each 11  . gabapentin (NEURONTIN) 600 MG tablet TAKE 2 TABLETS IN THE MORNING AND 3 TABLETS AT NIGHTTIME 155 tablet 5  . haloperidol (HALDOL) 0.5 MG tablet Take 0.5 tablets (0.25 mg total) by mouth 2 (two) times daily. 31 tablet 5  . loratadine (CLARITIN) 10 MG tablet Take 10 mg by mouth daily.    . ondansetron (ZOFRAN) 4 MG tablet Take 1 tablet (4 mg total) by mouth daily as needed for nausea or vomiting. 30 tablet 1  . ondansetron (ZOFRAN-ODT) 4 MG disintegrating tablet DISSOLVE 1 TABLET IN MOUTH EVERY 8 HOURS FOR NAUSEA AND VOMITING 30 tablet 5  . pregabalin (LYRICA) 300 MG capsule TAKE 1 CAPSULE BY MOUTH TWICE A DAY 180 capsule 4  . QUEtiapine (SEROQUEL) 200 MG tablet TAKE 1 TABLET (200 MG TOTAL) BY MOUTH AT BEDTIME. 90 tablet 4  . tiZANidine (ZANAFLEX) 4 MG tablet Take 0.5-1 tablets (2-4 mg total) by mouth every 8 (eight) hours as needed for muscle spasms. 90 tablet 4   No current facility-administered medications on file prior to visit.

## 2019-10-24 ENCOUNTER — Other Ambulatory Visit: Payer: Self-pay | Admitting: Family Medicine

## 2019-10-24 MED ORDER — AMPHETAMINE-DEXTROAMPHET ER 15 MG PO CP24: 15.0000 mg | ORAL_CAPSULE | ORAL | 0 refills | Status: DC

## 2019-10-24 NOTE — Telephone Encounter (Signed)
Adderall refill e-sent 

## 2019-10-24 NOTE — Telephone Encounter (Signed)
Pt notified rx submitted.

## 2019-10-24 NOTE — Telephone Encounter (Signed)
Pt needs an extra Patch of 75 MCG before monday due to losing in the shower and she will need a new refill for both after this week/ please advise   fentaNYL (DURAGESIC) 75 MCG/HR  fentaNYL (DURAGESIC) 12 MCG/HR

## 2019-10-25 MED ORDER — FENTANYL 75 MCG/HR TD PT72: 1.0000 | MEDICATED_PATCH | TRANSDERMAL | 0 refills | Status: DC

## 2019-10-25 MED ORDER — FENTANYL 12 MCG/HR TD PT72: 1.0000 | MEDICATED_PATCH | TRANSDERMAL | 0 refills | Status: DC

## 2019-10-25 NOTE — Telephone Encounter (Signed)
Patient would like Rx sent to    [id number] [name], [place] Phone:  [phone]  Fax:  [phone]      please call when done best # [phone]

## 2019-10-29 ENCOUNTER — Ambulatory Visit
Admission: RE | Admit: 2019-10-29 | Discharge: 2019-10-29 | Disposition: A | Discharge status: Home / Self Care | Payer: Medicare Other | Source: Ambulatory Visit | Attending: Adult Health | Admitting: Adult Health

## 2019-10-29 DIAGNOSIS — R922 Inconclusive mammogram: Secondary | ICD-10-CM | POA: Diagnosis not present

## 2019-10-29 DIAGNOSIS — N6321 Unspecified lump in the left breast, upper outer quadrant: Secondary | ICD-10-CM

## 2019-10-29 DIAGNOSIS — N6002 Solitary cyst of left breast: Secondary | ICD-10-CM | POA: Diagnosis not present

## 2019-10-29 DIAGNOSIS — N6011 Diffuse cystic mastopathy of right breast: Secondary | ICD-10-CM | POA: Insufficient documentation

## 2019-10-29 DIAGNOSIS — N6012 Diffuse cystic mastopathy of left breast: Secondary | ICD-10-CM | POA: Insufficient documentation

## 2019-10-29 DIAGNOSIS — Z803 Family history of malignant neoplasm of breast: Secondary | ICD-10-CM

## 2019-11-04 ENCOUNTER — Other Ambulatory Visit (INDEPENDENT_AMBULATORY_CARE_PROVIDER_SITE_OTHER): Payer: Self-pay | Admitting: Pediatrics

## 2019-11-04 ENCOUNTER — Telehealth (INDEPENDENT_AMBULATORY_CARE_PROVIDER_SITE_OTHER): Payer: Self-pay | Admitting: Pediatrics

## 2019-11-04 DIAGNOSIS — G2569 Other tics of organic origin: Secondary | ICD-10-CM

## 2019-11-04 NOTE — Telephone Encounter (Signed)
L/M informing patient that the refills she has requested were sent in this morning. Informed her to call her pharmacy to receive the refills. Invited her to call back if the refills were not there.

## 2019-11-04 NOTE — Telephone Encounter (Signed)
Patient called back and informed her that the refills were sent to pharmacy.

## 2019-11-04 NOTE — Telephone Encounter (Signed)
Who's calling (name and relationship to patient) : Kirrily (self)  Best contact number: [phone]  Provider they see: Dr. Gaynell Face  Reason for call:  Karen [name] called in requesting a refill on her Haldol and her Gabapentin has 1 refill left on it. Writer scheduled appt with Dr. Gaynell Face on 4/14 as [name] has not been seen in a year. Requesting refill be sent to make her to that appointment if able to.   Call ID:      PRESCRIPTION REFILL ONLY  Name of prescription:  Pharmacy:

## 2019-11-18 ENCOUNTER — Encounter: Payer: Self-pay | Admitting: Urology

## 2019-11-18 ENCOUNTER — Other Ambulatory Visit: Payer: Self-pay

## 2019-11-18 ENCOUNTER — Ambulatory Visit (INDEPENDENT_AMBULATORY_CARE_PROVIDER_SITE_OTHER): Payer: Medicare Other | Admitting: Urology

## 2019-11-18 VITALS — BP 149/103 | HR 91

## 2019-11-18 DIAGNOSIS — N1330 Unspecified hydronephrosis: Secondary | ICD-10-CM | POA: Diagnosis not present

## 2019-11-18 DIAGNOSIS — R350 Frequency of micturition: Secondary | ICD-10-CM

## 2019-11-18 LAB — URINALYSIS, COMPLETE
Bilirubin, UA: NEGATIVE
Glucose, UA: NEGATIVE
Ketones, UA: NEGATIVE
Leukocytes,UA: NEGATIVE
Nitrite, UA: NEGATIVE
Protein,UA: NEGATIVE
RBC, UA: NEGATIVE
Specific Gravity, UA: 1.01 (ref 1.005–1.030)
Urobilinogen, Ur: 0.2 mg/dL (ref 0.2–1.0)
pH, UA: 5.5 (ref 5.0–7.5)

## 2019-11-18 LAB — MICROSCOPIC EXAMINATION
Epithelial Cells (non renal): >10 /hpf — AB (ref 0–10)
RBC, Urine: NONE SEEN /hpf (ref 0–2)

## 2019-11-18 LAB — BLADDER SCAN AMB NON-IMAGING

## 2019-11-18 MED ORDER — TAMSULOSIN HCL 0.4 MG PO CAPS: 0.4000 mg | ORAL_CAPSULE | Freq: Every day | ORAL | 11 refills | Status: DC

## 2019-11-18 NOTE — Progress Notes (Signed)
11/18/2019 9:25 AM   Karen [name] 10-Jul-1976 [id number]  Referring provider: Birdie Sons, [facility] [place],  [place] [address]  No chief complaint on file.   HPI: Patient has a 10-year history of flow symptoms.  She must lean forward in order to void.  She likely cannot void unless she leans forward.  The flow was initially steady and then she double or triple voids.  She is never taken medication.  She voids every 1-2 hours gets up once a night.  She wears 1 damp pad a day from intermittent leakage.  She is Ehlers-Danlos syndrome and is wheelchair-bound because of it.  She gets radiofrequency treatments to her lower back and neck.  Occasionally gets a bladder infection  No history of kidney stones previous bladder surgery or hysterectomy   PMH: Past Medical History:  Diagnosis Date  . Ehlers-Danlos syndrome   . Fibromyalgia   . Headache   . Sleep apnea   . Tics of organic origin   . Transient alteration of awareness     Surgical History: Past Surgical History:  Procedure Laterality Date  . COLONOSCOPY WITH PROPOFOL N/A 02/13/2018   Procedure: COLONOSCOPY WITH PROPOFOL;  Surgeon: [name], MD;  Location: [facility] ENDOSCOPY;  Service: Endoscopy;  Laterality: N/A;  . DILATION AND CURETTAGE OF UTERUS  2009  . ESOPHAGOGASTRODUODENOSCOPY (EGD) WITH PROPOFOL  02/13/2018   Procedure: ESOPHAGOGASTRODUODENOSCOPY (EGD) WITH PROPOFOL;  Surgeon: [name], MD;  Location: [facility] ENDOSCOPY;  Service: Endoscopy;;  . EYE SURGERY Left 1990   3 Surgeries on left eye to correct crossed eye  . LAPAROSCOPY Left 2003   Fallopian Tube  . MANDIBLE SURGERY  1998  . OTHER SURGICAL HISTORY Right 1987   3 Surgeries to repair broken arm  . OTHER SURGICAL HISTORY Left    3 surgeries on her left thigh as an infant  . OTHER SURGICAL HISTORY  1998   Jaw surgery  . Right arm surgery  1987   x 3 due to fracture  . TOENAIL EXCISION Bilateral 06/13/2019   Procedure:  BILATERAL SECOND TOE PARTIAL NAIL ABLATION;  Surgeon: Erle Crocker, MD;  Location: [place];  Service: Orthopedics;  Laterality: Bilateral;  SURGERY REQUEST TIME 1 HOUR  . TONSILLECTOMY  12/13/2002    Home Medications:  Allergies as of 11/18/2019      Reactions   Chocolate Flavor    Other reaction(s): Other (See Comments) Wheezing, acne   Demerol [meperidine] Other (See Comments)   Slow to wake up when this drug is given.    Keflex [cephalexin] Nausea And Vomiting   Morphine And Related Nausea And Vomiting   Toradol [ketorolac Tromethamine] Nausea And Vomiting   Augmentin  [amoxicillin-pot Clavulanate]    Chocolate    GI distress   Nsaids    patient develops ulcers   Strawberry Extract    GI distress   Tape Dermatitis      Medication List       Accurate as of November 18, 2019  9:25 AM. If you have any questions, ask your nurse or doctor.        albuterol 108 (90 Base) MCG/ACT inhaler Commonly known as: Ventolin HFA Inhale 2 puffs into the lungs every 6 (six) hours as needed.   amphetamine-dextroamphetamine 15 MG 24 hr capsule Commonly known as: Adderall XR Take 1 capsule by mouth every morning.   busPIRone 10 MG tablet Commonly known as: BUSPAR TAKE 1 TABLET BY MOUTH  TWICE A DAY   celecoxib 200 MG capsule Commonly known as: CELEBREX TAKE 1 CAPSULE BY MOUTH TWICE A DAY   Dexilant 30 MG capsule Generic drug: Dexlansoprazole TAKE 1 CAPSULE BY MOUTH EVERY DAY   diclofenac Sodium 1 % Gel Commonly known as: VOLTAREN APPLY AS NEEDED 3 TIMES A DAY   DULoxetine 60 MG capsule Commonly known as: CYMBALTA TAKE 2 CAPSULES BY MOUTH DAILY   fentaNYL 75 MCG/HR Commonly known as: [place] 1 patch onto the skin every 3 (three) days.   fentaNYL 12 MCG/HR Commonly known as: [place] 1 patch onto the skin every 3 (three) days.   fexofenadine 180 MG tablet Commonly known as: ALLEGRA Take 180 mg by mouth daily.   fluticasone 50  MCG/ACT nasal spray Commonly known as: FLONASE SPRAY 2 SPRAYS INTO EACH NOSTRIL EVERY DAY   Fluticasone-Salmeterol 250-50 MCG/DOSE Aepb Commonly known as: ADVAIR Inhale 1 puff into the lungs every 12 (twelve) hours. Rinse mouth after use   gabapentin 600 MG tablet Commonly known as: NEURONTIN TAKE 2 TABLETS IN THE MORNING AND 3 TABLETS AT NIGHTTIME   haloperidol 0.5 MG tablet Commonly known as: HALDOL TAKE 0.5 TABLETS (0.25 MG TOTAL) BY MOUTH 2 (TWO) TIMES DAILY.   loratadine 10 MG tablet Commonly known as: CLARITIN Take 10 mg by mouth daily.   ondansetron 4 MG disintegrating tablet Commonly known as: ZOFRAN-ODT DISSOLVE 1 TABLET IN MOUTH EVERY 8 HOURS FOR NAUSEA AND VOMITING   ondansetron 4 MG tablet Commonly known as: Zofran Take 1 tablet (4 mg total) by mouth daily as needed for nausea or vomiting.   pregabalin 300 MG capsule Commonly known as: LYRICA TAKE 1 CAPSULE BY MOUTH TWICE A DAY   QUEtiapine 200 MG tablet Commonly known as: SEROQUEL TAKE 1 TABLET (200 MG TOTAL) BY MOUTH AT BEDTIME.   tiZANidine 4 MG tablet Commonly known as: ZANAFLEX Take 0.5-1 tablets (2-4 mg total) by mouth every 8 (eight) hours as needed for muscle spasms.       Allergies:  Allergies  Allergen Reactions  . Chocolate Flavor     Other reaction(s): Other (See Comments) Wheezing, acne  . Demerol [Meperidine] Other (See Comments)    Slow to wake up when this drug is given.   Marland Kitchen Keflex [Cephalexin] Nausea And Vomiting  . Morphine And Related Nausea And Vomiting  . Toradol [Ketorolac Tromethamine] Nausea And Vomiting  . Augmentin  [Amoxicillin-Pot Clavulanate]   . Chocolate     GI distress  . Nsaids     patient develops ulcers  . Strawberry Extract     GI distress  . Tape Dermatitis    Family History: Family History  Problem Relation Age of Onset  . Depression Mother   . Stroke Mother   . Fibromyalgia Mother   . Osteoarthritis Mother   . Asthma Brother   . Depression  Brother   . Migraines Brother   . Asperger's syndrome Brother   . Other Brother        Ehlers-Danlos Syndrome  . Cancer Maternal Aunt   . Asperger's syndrome Maternal Aunt   . Breast cancer Maternal Aunt   . Heart disease Paternal Aunt   . Heart disease Paternal Uncle   . Cervical cancer Maternal Grandmother   . Osteoporosis Maternal Grandmother        Died at 55  . Osteoarthritis Maternal Grandmother   . Colon cancer Maternal Grandfather   . Asthma Maternal Grandfather   . Asperger's syndrome Maternal Grandfather   .  Emphysema Maternal Grandfather        Died at 72  . Prostate cancer Maternal Grandfather   . Heart attack Paternal Grandfather        Died at 49  . Asthma Paternal Grandfather   . Tuberculosis Paternal Grandfather   . Cancer Cousin   . Asperger's syndrome Cousin   . Asperger's syndrome Other   . Heart Problems Paternal Grandmother        Died at 32  . Ehlers-Danlos syndrome Father   . Ehlers-Danlos syndrome Brother     Social History:  reports that she has never smoked. She has never used smokeless tobacco. She reports that she does not drink alcohol or use drugs.  ROS:                                        Physical Exam: There were no vitals taken for this visit.  Constitutional:  Alert and oriented, No acute distress. Sitting comfortably in chair  Laboratory Data: Lab Results  Component Value Date   WBC 5.6 10/15/2019   HGB 12.8 10/15/2019   HCT 37.9 10/15/2019   MCV 85 10/15/2019   PLT 256 10/15/2019    Lab Results  Component Value Date   CREATININE 1.00 10/15/2019    No results found for: PSA  No results found for: TESTOSTERONE  No results found for: HGBA1C  Urinalysis    Component Value Date/Time   COLORURINE YELLOW (A) 10/28/2017 1648   APPEARANCEUR CLOUDY (A) 10/28/2017 1648   APPEARANCEUR Clear 02/19/2014 0142   LABSPEC 1.009 10/28/2017 1648   LABSPEC 1.004 02/19/2014 0142   PHURINE 5.0 10/28/2017  1648   GLUCOSEU NEGATIVE 10/28/2017 1648   GLUCOSEU Negative 02/19/2014 0142   HGBUR NEGATIVE 10/28/2017 1648   BILIRUBINUR negative 10/09/2019 1632   BILIRUBINUR Negative 02/19/2014 0142   KETONESUR NEGATIVE 10/28/2017 1648   PROTEINUR Negative 10/09/2019 1632   PROTEINUR NEGATIVE 10/28/2017 1648   UROBILINOGEN 0.2 10/09/2019 1632   NITRITE negative 10/09/2019 1632   NITRITE NEGATIVE 10/28/2017 1648   LEUKOCYTESUR Moderate (2+) (A) 10/09/2019 1632   LEUKOCYTESUR Negative 02/19/2014 0142    Pertinent Imaging: Urinalysis reviewed. Urine culture sent. Renal ultrasound ordered. Chart reviewed   Assessment & Plan: Patient has flow symptoms.  She has done well with them.  She had a recent bladder infection.  Her residual was 178 mL.  I thought I would try her on Flomax as a trial and get a renal ultrasound to make certain she does not have silent hydronephrosis.  I think otherwise we need to have reasonable treatment goal.  I will not be offering sacral nerve stimulation  1. Urinary frequency  - Urinalysis, Complete - BLADDER SCAN AMB NON-IMAGING   No follow-ups on file.  Reece Packer, MD  [facility] [address], [place], [place] [address] [phone]

## 2019-11-20 DIAGNOSIS — M79674 Pain in right toe(s): Secondary | ICD-10-CM | POA: Diagnosis not present

## 2019-11-25 ENCOUNTER — Other Ambulatory Visit: Payer: Self-pay | Admitting: Family Medicine

## 2019-11-25 MED ORDER — FENTANYL 75 MCG/HR TD PT72: 1.0000 | MEDICATED_PATCH | TRANSDERMAL | 0 refills | Status: DC

## 2019-11-25 NOTE — Addendum Note (Signed)
Addended by: Judie Petit on: 11/25/2019 12:41 PM   Modules accepted: Orders

## 2019-11-25 NOTE — Telephone Encounter (Signed)
Medication Refill - Medication: fentaNYL (DURAGESIC) 12 MCG/HR & fentaNYL (DURAGESIC) 75 MCG/HR   Has the patient contacted their pharmacy? Yes.   Pt states that she is completely out of this medication. Please advise.  (Agent: If no, request that the patient contact the pharmacy for the refill.) (Agent: If yes, when and what did the pharmacy advise?)  Preferred Pharmacy (with phone number or street name):  [id number] [name]  [place] [address]  Phone: [phone] Fax: [phone]  Not a 24 hour pharmacy; exact hours not known.     Agent: Please be advised that RX refills may take up to 3 business days. We ask that you follow-up with your pharmacy.

## 2019-11-25 NOTE — Telephone Encounter (Signed)
Requested medication (s) are due for refill today: yes to both  Requested medication (s) are on the active medication list: yes to both  Last refill:  both refilled 10/25/19  Future visit scheduled: no  Notes to clinic:  Fentanyl not delegated to NT to refill.   Requested Prescriptions  Pending Prescriptions Disp Refills   fentaNYL (DURAGESIC) 12 MCG/HR 10 patch 0    Sig: Place 1 patch onto the skin every 3 (three) days.      Not Delegated - Analgesics:  Opioid Agonists Failed - 11/25/2019  9:34 AM      Failed - This refill cannot be delegated      Failed - Urine Drug Screen completed in last 360 days.      Passed - Valid encounter within last 6 months    Recent Outpatient Visits           1 month ago Rheumatoid arthritis, involving unspecified site, unspecified whether rheumatoid factor present [facility])   [facility] Birdie Sons, MD   1 month ago Breast lump on left side at 1 o'clock position   [facility] Flinchum, Kelby Aline, FNP   1 year ago Mild persistent asthma with exacerbation   [facility] Birdie Sons, MD   1 year ago [place], [place]   1 year ago [place], [place]       Future Appointments             In 2 weeks MacDiarmid, Nicki Reaper, MD [facility]              fentaNYL (DURAGESIC) 75 MCG/HR 10 patch 0    Sig: Place 1 patch onto the skin every 3 (three) days.      Not Delegated - Analgesics:  Opioid Agonists Failed - 11/25/2019  9:34 AM      Failed - This refill cannot be delegated      Failed - Urine Drug Screen completed in last 360 days.      Passed - Valid encounter within last 6 months    Recent Outpatient Visits           1 month ago Rheumatoid arthritis, involving unspecified site, unspecified whether rheumatoid factor present [facility])   [facility] Birdie Sons, MD   1  month ago Breast lump on left side at 1 o'clock position   [facility] Flinchum, Kelby Aline, FNP   1 year ago Mild persistent asthma with exacerbation   [facility] Birdie Sons, MD   1 year ago [place], [place]   1 year ago [place], [place]       Future Appointments             In 2 weeks Bjorn Loser, [place]

## 2019-11-26 ENCOUNTER — Other Ambulatory Visit: Payer: Self-pay

## 2019-11-26 ENCOUNTER — Other Ambulatory Visit: Payer: Self-pay | Admitting: Family Medicine

## 2019-11-26 MED ORDER — FENTANYL 12 MCG/HR TD PT72: 1.0000 | MEDICATED_PATCH | TRANSDERMAL | 0 refills | Status: DC

## 2019-11-26 NOTE — Telephone Encounter (Signed)
Copied from [place] [id number]. Topic: General - Other >> Nov 26, 2019 10:35 AM [name] wrote: Patient stated that she requested medication yesterday and only 1 medication was sent to pharmacy. Patient still needs fentaNYL (DURAGESIC) 12 MCG/HR  sent in.

## 2019-11-26 NOTE — Telephone Encounter (Signed)
Pt called to check status, hoping to receive this today. She is in pain, she is supposed to take both the 75 and 12 together. Please advise

## 2019-11-26 NOTE — Telephone Encounter (Signed)
Patient has been notified that prescription has been sent into pharmacy.

## 2019-11-26 NOTE — Addendum Note (Signed)
Addended by: Jules Schick on: 11/26/2019 01:16 PM   Modules accepted: Orders

## 2019-11-27 ENCOUNTER — Ambulatory Visit (INDEPENDENT_AMBULATORY_CARE_PROVIDER_SITE_OTHER): Payer: Medicare Other | Admitting: Pediatrics

## 2019-11-27 ENCOUNTER — Encounter (INDEPENDENT_AMBULATORY_CARE_PROVIDER_SITE_OTHER): Payer: Self-pay | Admitting: Pediatrics

## 2019-11-27 ENCOUNTER — Other Ambulatory Visit: Payer: Self-pay

## 2019-11-27 VITALS — BP 112/90 | HR 76 | Ht 59.5 in | Wt 159.0 lb

## 2019-11-27 DIAGNOSIS — G43009 Migraine without aura, not intractable, without status migrainosus: Secondary | ICD-10-CM

## 2019-11-27 DIAGNOSIS — Q796 Ehlers-Danlos syndrome, unspecified: Secondary | ICD-10-CM | POA: Diagnosis not present

## 2019-11-27 DIAGNOSIS — M797 Fibromyalgia: Secondary | ICD-10-CM

## 2019-11-27 DIAGNOSIS — G2569 Other tics of organic origin: Secondary | ICD-10-CM | POA: Diagnosis not present

## 2019-11-27 DIAGNOSIS — G4739 Other sleep apnea: Secondary | ICD-10-CM | POA: Diagnosis not present

## 2019-11-27 MED ORDER — HALOPERIDOL 0.5 MG PO TABS: 0.2500 mg | ORAL_TABLET | Freq: Two times a day (BID) | ORAL | 5 refills | Status: DC

## 2019-11-27 MED ORDER — GABAPENTIN 600 MG PO TABS: ORAL_TABLET | ORAL | 5 refills | Status: DC
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
## 2019-11-27 NOTE — Patient Instructions (Signed)
It was a pleasure to see you again.  Like to see you in 6 to 12 months depending upon what is happening.  I wish there was more that I could do to treat your migraines and fibromyalgia.  Please let me know if there is any need to see me sooner.

## 2019-12-02 ENCOUNTER — Telehealth: Payer: Self-pay | Admitting: Internal Medicine

## 2019-12-02 MED ORDER — AMPHETAMINE-DEXTROAMPHET ER 15 MG PO CP24: 15.0000 mg | ORAL_CAPSULE | ORAL | 0 refills | Status: DC

## 2019-12-02 NOTE — Telephone Encounter (Signed)
Please advise on adderall xr 15 mg refill  Pt last seen here 03/17/20 Next appt 03/19/20   Thanks

## 2019-12-02 NOTE — Telephone Encounter (Signed)
Pt notified rx was sent

## 2019-12-02 NOTE — Telephone Encounter (Signed)
Adderall refill e-sent 

## 2019-12-06 ENCOUNTER — Other Ambulatory Visit (INDEPENDENT_AMBULATORY_CARE_PROVIDER_SITE_OTHER): Payer: Self-pay | Admitting: Pediatrics

## 2019-12-06 DIAGNOSIS — M797 Fibromyalgia: Secondary | ICD-10-CM

## 2019-12-06 DIAGNOSIS — Q796 Ehlers-Danlos syndrome, unspecified: Secondary | ICD-10-CM

## 2019-12-09 ENCOUNTER — Ambulatory Visit: Payer: Self-pay | Admitting: Urology

## 2019-12-11 ENCOUNTER — Ambulatory Visit: Payer: Medicare Other

## 2019-12-11 ENCOUNTER — Telehealth: Payer: Self-pay | Admitting: Urology

## 2019-12-11 NOTE — Telephone Encounter (Signed)
Kim from Ultrasound called to let us know pt no showed her apt today

## 2019-12-11 NOTE — Telephone Encounter (Signed)
Spoke with patient, patient was given radiology scheduling phone number [phone], advised patient to call to reschedule RUS.

## 2019-12-13 ENCOUNTER — Ambulatory Visit
Admission: RE | Admit: 2019-12-13 | Discharge: 2019-12-13 | Disposition: A | Discharge status: Home / Self Care | Payer: Medicare Other | Source: Ambulatory Visit | Attending: Urology | Admitting: Urology

## 2019-12-13 ENCOUNTER — Other Ambulatory Visit: Payer: Self-pay

## 2019-12-13 DIAGNOSIS — N1330 Unspecified hydronephrosis: Secondary | ICD-10-CM

## 2019-12-16 ENCOUNTER — Ambulatory Visit: Payer: Medicare Other | Admitting: Urology

## 2019-12-23 ENCOUNTER — Other Ambulatory Visit: Payer: Self-pay | Admitting: Family Medicine

## 2019-12-23 DIAGNOSIS — M549 Dorsalgia, unspecified: Secondary | ICD-10-CM

## 2019-12-23 DIAGNOSIS — M5136 Other intervertebral disc degeneration, lumbar region: Secondary | ICD-10-CM

## 2019-12-23 DIAGNOSIS — G8929 Other chronic pain: Secondary | ICD-10-CM

## 2019-12-23 MED ORDER — FENTANYL 75 MCG/HR TD PT72: 1.0000 | MEDICATED_PATCH | TRANSDERMAL | 0 refills | Status: DC

## 2019-12-23 MED ORDER — FENTANYL 12 MCG/HR TD PT72: 1.0000 | MEDICATED_PATCH | TRANSDERMAL | 0 refills | Status: DC

## 2019-12-23 NOTE — Telephone Encounter (Signed)
Medication: fentaNYL ([place]) 12 MCG/HR [id number] , fentaNYL ([place]) 75 MCG/HR [id number]   Has the patient contacted their pharmacy? Yes  (Agent: If no, request that the patient contact the pharmacy for the refill.) (Agent: If yes, when and what did the pharmacy advise?)  Preferred Pharmacy (with phone number or street name): [id number] - [place], [place]  Phone:  [phone] Fax:  [phone]     Agent: Please be advised that RX refills may take up to 3 business days. We ask that you follow-up with your pharmacy.

## 2019-12-26 ENCOUNTER — Other Ambulatory Visit: Payer: Self-pay | Admitting: Family Medicine

## 2019-12-30 ENCOUNTER — Ambulatory Visit (INDEPENDENT_AMBULATORY_CARE_PROVIDER_SITE_OTHER): Payer: Medicare Other | Admitting: Urology

## 2019-12-30 ENCOUNTER — Encounter: Payer: Self-pay | Admitting: Urology

## 2019-12-30 ENCOUNTER — Other Ambulatory Visit: Payer: Self-pay

## 2019-12-30 VITALS — BP 105/69 | HR 72

## 2019-12-30 DIAGNOSIS — R350 Frequency of micturition: Secondary | ICD-10-CM | POA: Diagnosis not present

## 2019-12-30 DIAGNOSIS — R339 Retention of urine, unspecified: Secondary | ICD-10-CM | POA: Diagnosis not present

## 2019-12-30 MED ORDER — TAMSULOSIN HCL 0.4 MG PO CAPS: 0.4000 mg | ORAL_CAPSULE | Freq: Every day | ORAL | 3 refills | Status: DC

## 2019-12-30 NOTE — Progress Notes (Signed)
12/30/2019 2:57 PM   Karen [name] [birth year] [id number]  Referring provider: Birdie Sons, [place] [address] [place],  [place] [address]  Chief Complaint  Patient presents with  . Follow-up    HPI: Patient has a 10-year history of flow symptoms.  She must lean forward in order to void.  She likely cannot void unless she leans forward.  The flow was initially steady and then she double or triple voids.  She is never taken medication.  She voids every 1-2 hours gets up once a night.  She wears 1 damp pad a day from intermittent leakage.  She is Ehlers-Danlos syndrome and is wheelchair-bound because of it.  She gets radiofrequency treatments to her lower back and neck.  Patient has flow symptoms.  She has done well with them.  She had a recent bladder infection.  Her residual was 178 mL.  I thought I would try her on Flomax as a trial and get a renal ultrasound to make certain she does not have silent hydronephrosis.  I think otherwise we need to have reasonable treatment goal.  I will not be offering sacral nerve stimulation  Today Frequency is stable.  Ultrasound within normal limits Flow much better.  Still leans forward.  Did not wish to see physical therapy.  Clinically not infected   PMH: Past Medical History:  Diagnosis Date  . Ehlers-Danlos syndrome   . Fibromyalgia   . Headache   . Sleep apnea   . Tics of organic origin   . Transient alteration of awareness     Surgical History: Past Surgical History:  Procedure Laterality Date  . COLONOSCOPY WITH PROPOFOL N/A 02/13/2018   Procedure: COLONOSCOPY WITH PROPOFOL;  Surgeon: [name], MD;  Location: [facility] ENDOSCOPY;  Service: Endoscopy;  Laterality: N/A;  . DILATION AND CURETTAGE OF UTERUS  2009  . ESOPHAGOGASTRODUODENOSCOPY (EGD) WITH PROPOFOL  02/13/2018   Procedure: ESOPHAGOGASTRODUODENOSCOPY (EGD) WITH PROPOFOL;  Surgeon: [name], MD;  Location: [facility] ENDOSCOPY;  Service: Endoscopy;;  . EYE  SURGERY Left 1990   3 Surgeries on left eye to correct crossed eye  . LAPAROSCOPY Left 2003   Fallopian Tube  . MANDIBLE SURGERY  1998  . OTHER SURGICAL HISTORY Right 1987   3 Surgeries to repair broken arm  . OTHER SURGICAL HISTORY Left    3 surgeries on her left thigh as an infant  . OTHER SURGICAL HISTORY  1998   Jaw surgery  . Right arm surgery  1987   x 3 due to fracture  . TOENAIL EXCISION Bilateral 06/13/2019   Procedure: BILATERAL SECOND TOE PARTIAL NAIL ABLATION;  Surgeon: Erle Crocker, MD;  Location: [facility];  Service: Orthopedics;  Laterality: Bilateral;  SURGERY REQUEST TIME 1 HOUR  . TONSILLECTOMY  12/13/2002    Home Medications:  Allergies as of 12/30/2019      Reactions   Chocolate Flavor    Other reaction(s): Other (See Comments) Wheezing, acne   Demerol [meperidine] Other (See Comments)   Slow to wake up when this drug is given.    Keflex [cephalexin] Nausea And Vomiting   Morphine And Related Nausea And Vomiting   Toradol [ketorolac Tromethamine] Nausea And Vomiting   Augmentin  [amoxicillin-pot Clavulanate]    Chocolate    GI distress   Nsaids    patient develops ulcers   Strawberry Extract    GI distress   Tape Dermatitis      Medication List  Accurate as of Dec 30, 2019  2:57 PM. If you have any questions, ask your nurse or doctor.        albuterol 108 (90 Base) MCG/ACT inhaler Commonly known as: Ventolin HFA Inhale 2 puffs into the lungs every 6 (six) hours as needed.   amphetamine-dextroamphetamine 15 MG 24 hr capsule Commonly known as: Adderall XR Take 1 capsule by mouth every morning.   busPIRone 10 MG tablet Commonly known as: BUSPAR TAKE 1 TABLET BY MOUTH TWICE A DAY   celecoxib 200 MG capsule Commonly known as: CELEBREX TAKE 1 CAPSULE BY MOUTH TWICE A DAY   Dexilant 30 MG capsule Generic drug: Dexlansoprazole TAKE 1 CAPSULE BY MOUTH EVERY DAY   diclofenac Sodium 1 % Gel Commonly known as:  VOLTAREN APPLY AS NEEDED 3 TIMES A DAY   DULoxetine 60 MG capsule Commonly known as: CYMBALTA TAKE 2 CAPSULES BY MOUTH DAILY   fentaNYL 12 MCG/HR Commonly known as: DURAGESIC Place 1 patch onto the skin every 3 (three) days.   fentaNYL 75 MCG/HR Commonly known as: [place] 1 patch onto the skin every 3 (three) days.   fexofenadine 180 MG tablet Commonly known as: ALLEGRA Take 180 mg by mouth daily.   fluticasone 50 MCG/ACT nasal spray Commonly known as: FLONASE SPRAY 2 SPRAYS INTO EACH NOSTRIL EVERY DAY   Fluticasone-Salmeterol 250-50 MCG/DOSE Aepb Commonly known as: ADVAIR Inhale 1 puff into the lungs every 12 (twelve) hours. Rinse mouth after use   gabapentin 600 MG tablet Commonly known as: NEURONTIN TAKE 2 TABLETS IN THE MORNING AND 3 TABLETS AT NIGHTTIME   haloperidol 0.5 MG tablet Commonly known as: HALDOL Take 0.5 tablets (0.25 mg total) by mouth 2 (two) times daily.   loratadine 10 MG tablet Commonly known as: CLARITIN Take 10 mg by mouth daily.   ondansetron 4 MG disintegrating tablet Commonly known as: ZOFRAN-ODT DISSOLVE 1 TABLET IN MOUTH EVERY 8 HOURS FOR NAUSEA AND VOMITING   ondansetron 4 MG tablet Commonly known as: Zofran Take 1 tablet (4 mg total) by mouth daily as needed for nausea or vomiting.   pregabalin 300 MG capsule Commonly known as: LYRICA TAKE 1 CAPSULE BY MOUTH TWICE A DAY   QUEtiapine 200 MG tablet Commonly known as: SEROQUEL TAKE 1 TABLET (200 MG TOTAL) BY MOUTH AT BEDTIME.   tamsulosin 0.4 MG Caps capsule Commonly known as: FLOMAX Take 1 capsule (0.4 mg total) by mouth daily.   tiZANidine 4 MG tablet Commonly known as: ZANAFLEX Take 0.5-1 tablets (2-4 mg total) by mouth every 8 (eight) hours as needed for muscle spasms.       Allergies:  Allergies  Allergen Reactions  . Chocolate Flavor     Other reaction(s): Other (See Comments) Wheezing, acne  . Demerol [Meperidine] Other (See Comments)    Slow to wake up  when this drug is given.   Marland Kitchen Keflex [Cephalexin] Nausea And Vomiting  . Morphine And Related Nausea And Vomiting  . Toradol [Ketorolac Tromethamine] Nausea And Vomiting  . Augmentin  [Amoxicillin-Pot Clavulanate]   . Chocolate     GI distress  . Nsaids     patient develops ulcers  . Strawberry Extract     GI distress  . Tape Dermatitis    Family History: Family History  Problem Relation Age of Onset  . Depression Mother   . Stroke Mother   . Fibromyalgia Mother   . Osteoarthritis Mother   . Asthma Brother   . Depression Brother   .  Migraines Brother   . Asperger's syndrome Brother   . Other Brother        Ehlers-Danlos Syndrome  . Cancer Maternal Aunt   . Asperger's syndrome Maternal Aunt   . Breast cancer Maternal Aunt   . Heart disease Paternal Aunt   . Heart disease Paternal Uncle   . Cervical cancer Maternal Grandmother   . Osteoporosis Maternal Grandmother        Died at 29  . Osteoarthritis Maternal Grandmother   . Colon cancer Maternal Grandfather   . Asthma Maternal Grandfather   . Asperger's syndrome Maternal Grandfather   . Emphysema Maternal Grandfather        Died at 71  . Prostate cancer Maternal Grandfather   . Heart attack Paternal Grandfather        Died at 24  . Asthma Paternal Grandfather   . Tuberculosis Paternal Grandfather   . Cancer Cousin   . Asperger's syndrome Cousin   . Asperger's syndrome Other   . Heart Problems Paternal Grandmother        Died at 42  . Ehlers-Danlos syndrome Father   . Ehlers-Danlos syndrome Brother     Social History:  reports that she has never smoked. She has never used smokeless tobacco. She reports that she does not drink alcohol or use drugs.  ROS:                                        Physical Exam: BP 105/69   Pulse 72   Constitutional:  Alert and oriented, No acute distress.  Laboratory Data: Lab Results  Component Value Date   WBC 5.6 10/15/2019   HGB 12.8 10/15/2019     HCT 37.9 10/15/2019   MCV 85 10/15/2019   PLT 256 10/15/2019    Lab Results  Component Value Date   CREATININE 1.00 10/15/2019    No results found for: PSA  No results found for: TESTOSTERONE  No results found for: HGBA1C  Urinalysis    Component Value Date/Time   COLORURINE YELLOW (A) 10/28/2017 1648   APPEARANCEUR Cloudy (A) 11/18/2019 0953   LABSPEC 1.009 10/28/2017 1648   LABSPEC 1.004 02/19/2014 0142   PHURINE 5.0 10/28/2017 1648   GLUCOSEU Negative 11/18/2019 0953   GLUCOSEU Negative 02/19/2014 0142   HGBUR NEGATIVE 10/28/2017 1648   BILIRUBINUR Negative 11/18/2019 0953   BILIRUBINUR Negative 02/19/2014 0142   KETONESUR NEGATIVE 10/28/2017 1648   PROTEINUR Negative 11/18/2019 0953   PROTEINUR NEGATIVE 10/28/2017 1648   UROBILINOGEN 0.2 10/09/2019 1632   NITRITE Negative 11/18/2019 0953   NITRITE NEGATIVE 10/28/2017 1648   LEUKOCYTESUR Negative 11/18/2019 0953   LEUKOCYTESUR Negative 02/19/2014 0142    Pertinent Imaging:   Assessment & Plan: Prescription renewed and see near  1. Urinary frequency    No follow-ups on file.  Reece Packer, MD  [facility] [address], [place], [place] [address] [phone]

## 2020-01-01 ENCOUNTER — Other Ambulatory Visit: Payer: Self-pay | Admitting: Family Medicine

## 2020-01-01 ENCOUNTER — Telehealth: Payer: Self-pay | Admitting: Family Medicine

## 2020-01-01 ENCOUNTER — Telehealth: Payer: Self-pay | Admitting: Internal Medicine

## 2020-01-01 MED ORDER — AMPHETAMINE-DEXTROAMPHET ER 15 MG PO CP24: 15.0000 mg | ORAL_CAPSULE | ORAL | 0 refills | Status: DC

## 2020-01-01 NOTE — Telephone Encounter (Signed)
Requested medication (s) are due for refill today: yes  Requested medication (s) are on the active medication list: yes  Last refill:  10/01/2019  Future visit scheduled: no  Notes to clinic:  this refill cannot be delegated    Requested Prescriptions  Pending Prescriptions Disp Refills   pregabalin (LYRICA) 300 MG capsule [Pharmacy Med Name: PREGABALIN 300 MG CAPSULE] 180 capsule     Sig: TAKE 1 CAPSULE BY MOUTH TWICE A DAY      Not Delegated - Neurology:  Anticonvulsants - Controlled Failed - 01/01/2020  7:45 AM      Failed - This refill cannot be delegated      Passed - Valid encounter within last 12 months    Recent Outpatient Visits           2 months ago Rheumatoid arthritis, involving unspecified site, unspecified whether rheumatoid factor present [name])   [facility] Birdie Sons, MD   2 months ago Breast lump on left side at 1 o'clock position   [facility] Flinchum, Kelby Aline, FNP   1 year ago Mild persistent asthma with exacerbation   [facility] Birdie Sons, MD   1 year ago [place], [place]   1 year ago [place], [place]       Future Appointments             In 1 year MacDiarmid, Nicki Reaper, [facility]

## 2020-01-01 NOTE — Telephone Encounter (Signed)
Spoke with patient regarding prior message. Advised patient Dr.Young sent in her refill for Adderall 15mg  sent into her pharmacy. Patient's voice was understanding. Nothing else further needed.

## 2020-01-01 NOTE — Telephone Encounter (Signed)
Pt requests that another message be sent to Dr. Caryn Section regarding the Rx refill for Lyrica because they will be without a car and she will not have a way to get her medication.

## 2020-01-01 NOTE — Telephone Encounter (Signed)
Called and spoke with patient letting her know that I would send request over to Dr. Annamaria Boots. Verified pharmacy.

## 2020-01-01 NOTE — Telephone Encounter (Signed)
PT need refill / pregabalin (LYRICA) 300 MG capsule [id number]

## 2020-01-01 NOTE — Telephone Encounter (Signed)
Refill request for Adderall  Last OV: 03/18/2019 Next OV: 03/19/2020  Last ordered by : Dr. Annamaria Boots Quantity: 31 with 0 refills Instructions: Take 1 capsule by mouth every morning  Dr. Annamaria Boots please advise  Allergies  Allergen Reactions  . Chocolate Flavor     Other reaction(s): Other (See Comments) Wheezing, acne  . Demerol [Meperidine] Other (See Comments)    Slow to wake up when this drug is given.   Marland Kitchen Keflex [Cephalexin] Nausea And Vomiting  . Morphine And Related Nausea And Vomiting  . Toradol [Ketorolac Tromethamine] Nausea And Vomiting  . Augmentin  [Amoxicillin-Pot Clavulanate]   . Chocolate     GI distress  . Nsaids     patient develops ulcers  . Strawberry Extract     GI distress  . Tape Dermatitis    Current Outpatient Medications on File Prior to Visit  Medication Sig Dispense Refill  . albuterol (VENTOLIN HFA) 108 (90 Base) MCG/ACT inhaler Inhale 2 puffs into the lungs every 6 (six) hours as needed. 8 g 12  . amphetamine-dextroamphetamine (ADDERALL XR) 15 MG 24 hr capsule Take 1 capsule by mouth every morning. 31 capsule 0  . busPIRone (BUSPAR) 10 MG tablet TAKE 1 TABLET BY MOUTH TWICE A DAY 180 tablet 1  . celecoxib (CELEBREX) 200 MG capsule TAKE 1 CAPSULE BY MOUTH TWICE A DAY 60 capsule 11  . DEXILANT 30 MG capsule TAKE 1 CAPSULE BY MOUTH EVERY DAY 30 capsule 11  . diclofenac Sodium (VOLTAREN) 1 % GEL APPLY AS NEEDED 3 TIMES A DAY 100 g 0  . DULoxetine (CYMBALTA) 60 MG capsule TAKE 2 CAPSULES BY MOUTH DAILY 180 capsule 4  . fentaNYL (DURAGESIC) 12 MCG/HR Place 1 patch onto the skin every 3 (three) days. 10 patch 0  . fentaNYL (DURAGESIC) 75 MCG/HR Place 1 patch onto the skin every 3 (three) days. 10 patch 0  . fexofenadine (ALLEGRA) 180 MG tablet Take 180 mg by mouth daily.    . fluticasone (FLONASE) 50 MCG/ACT nasal spray SPRAY 2 SPRAYS INTO EACH NOSTRIL EVERY DAY 16 g 2  . Fluticasone-Salmeterol (ADVAIR) 250-50 MCG/DOSE AEPB Inhale 1 puff into the lungs every 12  (twelve) hours. Rinse mouth after use 60 each 11  . gabapentin (NEURONTIN) 600 MG tablet TAKE 2 TABLETS IN THE MORNING AND 3 TABLETS AT NIGHTTIME 155 tablet 5  . haloperidol (HALDOL) 0.5 MG tablet Take 0.5 tablets (0.25 mg total) by mouth 2 (two) times daily. 31 tablet 5  . loratadine (CLARITIN) 10 MG tablet Take 10 mg by mouth daily.    . ondansetron (ZOFRAN) 4 MG tablet Take 1 tablet (4 mg total) by mouth daily as needed for nausea or vomiting. 30 tablet 1  . ondansetron (ZOFRAN-ODT) 4 MG disintegrating tablet DISSOLVE 1 TABLET IN MOUTH EVERY 8 HOURS FOR NAUSEA AND VOMITING 30 tablet 5  . pregabalin (LYRICA) 300 MG capsule TAKE 1 CAPSULE BY MOUTH TWICE A DAY 180 capsule 5  . QUEtiapine (SEROQUEL) 200 MG tablet TAKE 1 TABLET (200 MG TOTAL) BY MOUTH AT BEDTIME. 90 tablet 4  . tamsulosin (FLOMAX) 0.4 MG CAPS capsule Take 1 capsule (0.4 mg total) by mouth daily. 90 capsule 3  . tiZANidine (ZANAFLEX) 4 MG tablet Take 0.5-1 tablets (2-4 mg total) by mouth every 8 (eight) hours as needed for muscle spasms. 90 tablet 4   No current facility-administered medications on file prior to visit.

## 2020-01-01 NOTE — Telephone Encounter (Signed)
Adderall refill e-sent 

## 2020-01-01 NOTE — Telephone Encounter (Signed)
RX has been sent to pharmacy. Left patient a voicemail advising.

## 2020-01-01 NOTE — Telephone Encounter (Signed)
Duplicate request! Already sent to provider for approval

## 2020-01-21 ENCOUNTER — Other Ambulatory Visit: Payer: Self-pay | Admitting: Family Medicine

## 2020-01-21 DIAGNOSIS — M5136 Other intervertebral disc degeneration, lumbar region: Secondary | ICD-10-CM

## 2020-01-21 DIAGNOSIS — G8929 Other chronic pain: Secondary | ICD-10-CM

## 2020-01-21 NOTE — Telephone Encounter (Signed)
Requested medication (s) are due for refill today: yes  Requested medication (s) are on the active medication list: yes  Last refill:  12/23/19 for both  Future visit scheduled: no  Notes to clinic:  Meds not delegated to NT to RF   Requested Prescriptions  Pending Prescriptions Disp Refills   fentaNYL (DURAGESIC) 12 MCG/HR 10 patch 0    Sig: Place 1 patch onto the skin every 3 (three) days.      Not Delegated - Analgesics:  Opioid Agonists Failed - 01/21/2020  5:22 PM      Failed - This refill cannot be delegated      Failed - Urine Drug Screen completed in last 360 days.      Passed - Valid encounter within last 6 months    Recent Outpatient Visits           3 months ago Rheumatoid arthritis, involving unspecified site, unspecified whether rheumatoid factor present [facility])   [facility] Birdie Sons, MD   3 months ago Breast lump on left side at 1 o'clock position   [facility] Flinchum, Kelby Aline, FNP   1 year ago Mild persistent asthma with exacerbation   [facility] Birdie Sons, MD   1 year ago [place], [place]   1 year ago [place], [place]       Future Appointments             In 11 months MacDiarmid, Nicki Reaper, MD [facility]              fentaNYL (DURAGESIC) 75 MCG/HR 10 patch 0    Sig: Place 1 patch onto the skin every 3 (three) days.      Not Delegated - Analgesics:  Opioid Agonists Failed - 01/21/2020  5:22 PM      Failed - This refill cannot be delegated      Failed - Urine Drug Screen completed in last 360 days.      Passed - Valid encounter within last 6 months    Recent Outpatient Visits           3 months ago Rheumatoid arthritis, involving unspecified site, unspecified whether rheumatoid factor present [facility])   [facility] Birdie Sons, MD   3 months ago Breast lump on left  side at 1 o'clock position   [facility] Flinchum, Kelby Aline, FNP   1 year ago Mild persistent asthma with exacerbation   [facility] Birdie Sons, MD   1 year ago [place], [place]   1 year ago [place], [place]       Future Appointments             In 11 months [place], Nicki Reaper, [place]

## 2020-01-21 NOTE — Telephone Encounter (Signed)
Copied from [id number]. Topic: Quick Communication - Rx Refill/Question >> Jan 21, 2020  5:16 PM Mcneil, Ja-Kwan wrote: Medication: fentaNYL (DURAGESIC) 12 MCG/HR and fentaNYL ([place]) 75 MCG/HR  Has the patient contacted their pharmacy? no  Preferred Pharmacy (with phone number or street name): [id number] [name], [place] Phone: [phone] Fax: [phone]  Agent: Please be advised that RX refills may take up to 3 business days. We ask that you follow-up with your pharmacy.

## 2020-01-22 MED ORDER — FENTANYL 75 MCG/HR TD PT72: 1.0000 | MEDICATED_PATCH | TRANSDERMAL | 0 refills | Status: DC

## 2020-01-22 MED ORDER — FENTANYL 12 MCG/HR TD PT72: 1.0000 | MEDICATED_PATCH | TRANSDERMAL | 0 refills | Status: DC

## 2020-02-05 ENCOUNTER — Telehealth: Payer: Self-pay | Admitting: Internal Medicine

## 2020-02-05 MED ORDER — AMPHETAMINE-DEXTROAMPHET ER 15 MG PO CP24: 15.0000 mg | ORAL_CAPSULE | ORAL | 0 refills | Status: DC

## 2020-02-05 NOTE — Telephone Encounter (Signed)
Received a call from patient requesting a refill on her Adderall xr.  Verified her pharmacy information is correct.  I let the patient know that I would route the request to Dr. Annamaria Boots as he will have to be the one to refill this prescription.  Pt. Verbalized understanding.  Dr. Annamaria Boots, please advise.

## 2020-02-05 NOTE — Telephone Encounter (Signed)
Adderall refill e-sent 

## 2020-02-12 NOTE — Progress Notes (Deleted)
MyChart Video Visit    Virtual Visit via Video Note   This visit type was conducted due to national recommendations for restrictions regarding the COVID-19 Pandemic (e.g. social distancing) in an effort to limit this patient's exposure and mitigate transmission in our community. This patient is at least at moderate risk for complications without adequate follow up. This format is felt to be most appropriate for this patient at this time. Physical exam was limited by quality of the video and audio technology used for the visit.   Patient location: *** Provider location: ***   Patient: Karen [name]   DOB: 1975/12/19   44 y.o. Female  MRN: [id number] Visit Date: 02/14/2020  Today's healthcare provider: Lelon Huh, MD   No chief complaint on file.  Subjective    URI  This is a new problem. The current episode started more than 1 month ago. Associated symptoms include ear pain and sinus pain.      {Show patient history (optional):23778::" "}  Medications: Outpatient Medications Prior to Visit  Medication Sig  . albuterol (VENTOLIN HFA) 108 (90 Base) MCG/ACT inhaler Inhale 2 puffs into the lungs every 6 (six) hours as needed.  Marland Kitchen amphetamine-dextroamphetamine (ADDERALL XR) 15 MG 24 hr capsule Take 1 capsule by mouth every morning.  . busPIRone (BUSPAR) 10 MG tablet TAKE 1 TABLET BY MOUTH TWICE A DAY  . celecoxib (CELEBREX) 200 MG capsule TAKE 1 CAPSULE BY MOUTH TWICE A DAY  . DEXILANT 30 MG capsule TAKE 1 CAPSULE BY MOUTH EVERY DAY  . diclofenac Sodium (VOLTAREN) 1 % GEL APPLY AS NEEDED 3 TIMES A DAY  . DULoxetine (CYMBALTA) 60 MG capsule TAKE 2 CAPSULES BY MOUTH DAILY  . fentaNYL (DURAGESIC) 12 MCG/HR Place 1 patch onto the skin every 3 (three) days.  . fentaNYL (DURAGESIC) 75 MCG/HR Place 1 patch onto the skin every 3 (three) days.  . fexofenadine (ALLEGRA) 180 MG tablet Take 180 mg by mouth daily.  . fluticasone (FLONASE) 50 MCG/ACT nasal spray SPRAY 2 SPRAYS INTO  EACH NOSTRIL EVERY DAY  . Fluticasone-Salmeterol (ADVAIR) 250-50 MCG/DOSE AEPB Inhale 1 puff into the lungs every 12 (twelve) hours. Rinse mouth after use  . gabapentin (NEURONTIN) 600 MG tablet TAKE 2 TABLETS IN THE MORNING AND 3 TABLETS AT NIGHTTIME  . haloperidol (HALDOL) 0.5 MG tablet Take 0.5 tablets (0.25 mg total) by mouth 2 (two) times daily.  Marland Kitchen loratadine (CLARITIN) 10 MG tablet Take 10 mg by mouth daily.  . ondansetron (ZOFRAN) 4 MG tablet Take 1 tablet (4 mg total) by mouth daily as needed for nausea or vomiting.  . ondansetron (ZOFRAN-ODT) 4 MG disintegrating tablet DISSOLVE 1 TABLET IN MOUTH EVERY 8 HOURS FOR NAUSEA AND VOMITING  . pregabalin (LYRICA) 300 MG capsule TAKE 1 CAPSULE BY MOUTH TWICE A DAY  . QUEtiapine (SEROQUEL) 200 MG tablet TAKE 1 TABLET (200 MG TOTAL) BY MOUTH AT BEDTIME.  . tamsulosin (FLOMAX) 0.4 MG CAPS capsule Take 1 capsule (0.4 mg total) by mouth daily.  Marland Kitchen tiZANidine (ZANAFLEX) 4 MG tablet Take 0.5-1 tablets (2-4 mg total) by mouth every 8 (eight) hours as needed for muscle spasms.   No facility-administered medications prior to visit.    Review of Systems  Constitutional: Negative.   HENT: Positive for ear pain and sinus pain.   Respiratory: Negative.   Cardiovascular: Negative.   Musculoskeletal: Negative.     {Heme  Chem  Endocrine  Serology  Results Review (optional):23779::" "}  Objective  There were no vitals taken for this visit. {Show previous vital signs (optional):23777::" "}  Physical Exam     Assessment & Plan     ***  No follow-ups on file.     I discussed the assessment and treatment plan with the patient. The patient was provided an opportunity to ask questions and all were answered. The patient agreed with the plan and demonstrated an understanding of the instructions.   The patient was advised to call back or seek an in-person evaluation if the symptoms worsen or if the condition fails to improve as anticipated.  I  provided *** minutes of non-face-to-face time during this encounter.  {provider attestation***:1}  Lelon Huh, MD [facility] [phone] (phone) [phone] (fax)  [place]

## 2020-02-14 ENCOUNTER — Telehealth: Payer: Medicare Other | Admitting: Family Medicine

## 2020-02-18 ENCOUNTER — Other Ambulatory Visit: Payer: Self-pay | Admitting: Family Medicine

## 2020-02-18 DIAGNOSIS — G8929 Other chronic pain: Secondary | ICD-10-CM

## 2020-02-18 DIAGNOSIS — M5136 Other intervertebral disc degeneration, lumbar region: Secondary | ICD-10-CM

## 2020-02-18 NOTE — Telephone Encounter (Signed)
Patient requesting refill of Fentanyl. Refill not delegated per protocol. Please review for refill.

## 2020-02-18 NOTE — Telephone Encounter (Signed)
Medication Refill - Medication: Fentanyl  Has the patient contacted their pharmacy? No. Always advised to have PCP send in (Agent: If no, request that the patient contact the pharmacy for the refill.) (Agent: If yes, when and what did the pharmacy advise?)  Preferred Pharm[facility] (with phone number [facility] street [facility] [address]  Agent: Please be advised that RX refills may take up to 3 business days. We ask that you follow-up with your pharmacy.

## 2020-02-20 MED ORDER — FENTANYL 75 MCG/HR TD PT72: 1.0000 | MEDICATED_PATCH | TRANSDERMAL | 0 refills | Status: DC

## 2020-02-20 MED ORDER — FENTANYL 12 MCG/HR TD PT72: 1.0000 | MEDICATED_PATCH | TRANSDERMAL | 0 refills | Status: DC

## 2020-02-25 ENCOUNTER — Telehealth (INDEPENDENT_AMBULATORY_CARE_PROVIDER_SITE_OTHER): Payer: Medicare Other | Admitting: Family Medicine

## 2020-02-25 DIAGNOSIS — M549 Dorsalgia, unspecified: Secondary | ICD-10-CM | POA: Diagnosis not present

## 2020-02-25 DIAGNOSIS — M069 Rheumatoid arthritis, unspecified: Secondary | ICD-10-CM | POA: Diagnosis not present

## 2020-02-25 DIAGNOSIS — G8929 Other chronic pain: Secondary | ICD-10-CM

## 2020-02-25 DIAGNOSIS — J329 Chronic sinusitis, unspecified: Secondary | ICD-10-CM | POA: Diagnosis not present

## 2020-02-25 DIAGNOSIS — H109 Unspecified conjunctivitis: Secondary | ICD-10-CM | POA: Diagnosis not present

## 2020-02-25 MED ORDER — AZITHROMYCIN 250 MG PO TABS: ORAL_TABLET | ORAL | 1 refills | Status: AC

## 2020-02-25 MED ORDER — CIPROFLOXACIN HCL 0.3 % OP SOLN: 1.0000 [drp] | OPHTHALMIC | 0 refills | Status: AC

## 2020-02-25 NOTE — Progress Notes (Signed)
MyChart Video Visit    Virtual Visit via Video Note   This visit type was conducted due to national recommendations for restrictions regarding the COVID-19 Pandemic (e.g. social distancing) in an effort to limit this patient's exposure and mitigate transmission in our community. This patient is at least at moderate risk for complications without adequate follow up. This format is felt to be most appropriate for this patient at this time. Physical exam was limited by quality of the video and audio technology used for the visit.   Patient location: home Provider location: bfp   Patient: Karen [name]   DOB: 05/17/1976   44 y.o. Female  MRN: [id number] Visit Date: 02/25/2020  Today's healthcare provider: Lelon Huh, MD   Chief Complaint  Patient presents with  . Sinus Problem   Subjective    Sinus Problem This is a recurrent problem. The current episode started more than 1 month ago. The problem is unchanged. There has been no fever. Associated symptoms include congestion, ear pain and sinus pressure. Pertinent negatives include no chills, coughing, diaphoresis, shortness of breath, sneezing or sore throat. (Congestion, eye discharge, puffy eyes) Past treatments include oral decongestants (antihistamines, Allegra).   [facility] and was prescribed Azelastine eye drops which helped a little bit with itchy, but continues to have yellow drainage and redness in eyes.   She also report having more back pain over the last few months and doesn't feel like Fentanyl patch is working as well.     Medications: Outpatient Medications Prior to Visit  Medication Sig  . albuterol (VENTOLIN HFA) 108 (90 Base) MCG/ACT inhaler Inhale 2 puffs into the lungs every 6 (six) hours as needed.  Marland Kitchen amphetamine-dextroamphetamine (ADDERALL XR) 15 MG 24 hr capsule Take 1 capsule by mouth every morning.  . busPIRone (BUSPAR) 10 MG tablet TAKE 1 TABLET BY MOUTH  TWICE A DAY  . celecoxib (CELEBREX) 200 MG capsule TAKE 1 CAPSULE BY MOUTH TWICE A DAY  . DEXILANT 30 MG capsule TAKE 1 CAPSULE BY MOUTH EVERY DAY  . diclofenac Sodium (VOLTAREN) 1 % GEL APPLY AS NEEDED 3 TIMES A DAY  . DULoxetine (CYMBALTA) 60 MG capsule TAKE 2 CAPSULES BY MOUTH DAILY  . fentaNYL (DURAGESIC) 12 MCG/HR Place 1 patch onto the skin every 3 (three) days.  . fentaNYL (DURAGESIC) 75 MCG/HR Place 1 patch onto the skin every 3 (three) days.  . fexofenadine (ALLEGRA) 180 MG tablet Take 180 mg by mouth daily.  . fluticasone (FLONASE) 50 MCG/ACT nasal spray SPRAY 2 SPRAYS INTO EACH NOSTRIL EVERY DAY  . Fluticasone-Salmeterol (ADVAIR) 250-50 MCG/DOSE AEPB Inhale 1 puff into the lungs every 12 (twelve) hours. Rinse mouth after use  . gabapentin (NEURONTIN) 600 MG tablet TAKE 2 TABLETS IN THE MORNING AND 3 TABLETS AT NIGHTTIME  . haloperidol (HALDOL) 0.5 MG tablet Take 0.5 tablets (0.25 mg total) by mouth 2 (two) times daily.  Marland Kitchen loratadine (CLARITIN) 10 MG tablet Take 10 mg by mouth daily.  . ondansetron (ZOFRAN) 4 MG tablet Take 1 tablet (4 mg total) by mouth daily as needed for nausea or vomiting.  . ondansetron (ZOFRAN-ODT) 4 MG disintegrating tablet DISSOLVE 1 TABLET IN MOUTH EVERY 8 HOURS FOR NAUSEA AND VOMITING  . pregabalin (LYRICA) 300 MG capsule TAKE 1 CAPSULE BY MOUTH TWICE A DAY  . QUEtiapine (SEROQUEL) 200 MG tablet TAKE 1 TABLET (200 MG TOTAL) BY MOUTH AT BEDTIME.  . tamsulosin (FLOMAX) 0.4 MG CAPS  capsule Take 1 capsule (0.4 mg total) by mouth daily.  Marland Kitchen tiZANidine (ZANAFLEX) 4 MG tablet Take 0.5-1 tablets (2-4 mg total) by mouth every 8 (eight) hours as needed for muscle spasms.   No facility-administered medications prior to visit.    Review of Systems  Constitutional: Positive for fatigue. Negative for appetite change, chills, diaphoresis and fever.  HENT: Positive for congestion, ear pain, postnasal drip, sinus pressure and sinus pain. Negative for nosebleeds,  rhinorrhea, sneezing and sore throat.        Decreased hearing  Eyes: Positive for pain, discharge and itching.       Puffy eyes  Respiratory: Negative for cough, chest tightness and shortness of breath.   Cardiovascular: Negative for chest pain and palpitations.  Gastrointestinal: Negative for abdominal pain, nausea and vomiting.  Neurological: Negative for dizziness and weakness.     Objective    There were no vitals taken for this visit.  Physical Exam   Awake, alert, oriented x 3. In no apparent distress   Assessment & Plan     1. Sinusitis, unspecified chronicity, unspecified location  - azithromycin (ZITHROMAX) 250 MG tablet; 2 by mouth today, then 1 daily for 4 days  Dispense: 6 tablet; Refill: 1  2. Conjunctivitis of both eyes, unspecified conjunctivitis type  - ciprofloxacin (CILOXAN) 0.3 % ophthalmic solution; Place 1 drop into both eyes every 4 (four) hours while awake for 7 days.  Dispense: 5 mL; Refill: 0  Complicated by perineal allergies. Advised to continue allergy nose sprays and eye drops.   3. Chronic bilateral back pain, unspecified back location Has gotten worse the last few months, suspect exacerbated by inflammatory state from allergies and URIs. Advised to continue current dose of Fentanyl patches. If not improved by the time she is due for refill in August will consider increasing patch to 168mcg   4. Rheumatoid arthritis, involving unspecified site, unspecified whether rheumatoid factor present (HCC) Chronic pain as above     No follow-ups on file.     I discussed the assessment and treatment plan with the patient. The patient was provided an opportunity to ask questions and all were answered. The patient agreed with the plan and demonstrated an understanding of the instructions.   The patient was advised to call back or seek an in-person evaluation if the symptoms worsen or if the condition fails to improve as anticipated.  I provided 12 minutes  of non-face-to-face time during this encounter. Video connection was lost when more than 50% of the duration of the visit was complete, at which time the remainder of the visit was completed via audio only.   The entirety of the information documented in the History of Present Illness, Review of Systems and Physical Exam were personally obtained by me. Portions of this information were initially documented by the CMA and reviewed by me for thoroughness and accuracy.     Lelon Huh, MD [facility] [phone] (phone) [phone] (fax)  [place]

## 2020-03-10 ENCOUNTER — Telehealth: Payer: Self-pay

## 2020-03-10 DIAGNOSIS — J329 Chronic sinusitis, unspecified: Secondary | ICD-10-CM

## 2020-03-10 DIAGNOSIS — G8929 Other chronic pain: Secondary | ICD-10-CM

## 2020-03-10 DIAGNOSIS — M5136 Other intervertebral disc degeneration, lumbar region: Secondary | ICD-10-CM

## 2020-03-10 DIAGNOSIS — M549 Dorsalgia, unspecified: Secondary | ICD-10-CM

## 2020-03-10 MED ORDER — FENTANYL 100 MCG/HR TD PT72: 1.0000 | MEDICATED_PATCH | TRANSDERMAL | 0 refills | Status: DC

## 2020-03-10 NOTE — Telephone Encounter (Signed)
Copied from [place] [id number]. Topic: Quick Communication - See Telephone Encounter >> Mar 10, 2020  4:50 PM Loma Boston wrote: CRM for notification. See Telephone encounter for: 03/10/20. Pt has called in to leave a message with Dr Caryn Section. States that has finished Zithromax and Ciloxan and continuing patches as discussed on 7/13. States she is no better. Pt states wants to increase patch to 100 MCG as discussed. Pt wants to know if he wants to call something else in as far as an antibodic or does she need to be referred to a ENT. Please FU at [phone].

## 2020-03-10 NOTE — Addendum Note (Signed)
Addended by: Birdie Sons on: 03/10/2020 05:17 PM   Modules accepted: Orders

## 2020-03-10 NOTE — Telephone Encounter (Signed)
Have sent prescription for 116mcg fentanyl patch and ENT referral.

## 2020-03-11 NOTE — Telephone Encounter (Signed)
Left patient a message advising her that RX has been sent to pharmacy and referral has been placed.

## 2020-03-13 ENCOUNTER — Telehealth: Payer: Self-pay | Admitting: Internal Medicine

## 2020-03-13 MED ORDER — AMPHETAMINE-DEXTROAMPHET ER 15 MG PO CP24: 15.0000 mg | ORAL_CAPSULE | ORAL | 0 refills | Status: DC

## 2020-03-13 NOTE — Telephone Encounter (Signed)
Attempted to call pt but unable to reach. Left her a detailed message letting her know that her adderall had been refilled by [facility]. Nothing further needed.

## 2020-03-13 NOTE — Telephone Encounter (Signed)
adderral refill e-sent

## 2020-03-13 NOTE — Telephone Encounter (Addendum)
Please advise if okay to refill adderall  Last given #31 tablets on 02/05/20 Next ov 03/19/20 with Wyn Quaker  Last ov 03/18/19 with Dr Annamaria Boots  I confirmed with pt her preferred pharmacy is [facility]

## 2020-03-16 ENCOUNTER — Telehealth: Payer: Self-pay | Admitting: Internal Medicine

## 2020-03-16 NOTE — Telephone Encounter (Signed)
Called and spoke with pt letting her know that [facility] refilled her adderall Rx 7/30 and also stated to her that our automated answering service might have called her in regards to her appt 8/5. Pt verbalized understanding. Nothing further needed.

## 2020-03-19 ENCOUNTER — Ambulatory Visit: Payer: Medicare Other | Admitting: Internal Medicine

## 2020-03-19 ENCOUNTER — Ambulatory Visit: Payer: Medicare Other | Admitting: Pulmonary Disease

## 2020-03-19 NOTE — Progress Notes (Deleted)
@Patient  ID: Karen [name], female    DOB: 1975-11-27, 44 y.o.   MRN: [id number]  No chief complaint on file.   Referring provider: Birdie Sons, MD  HPI:  44 year old female never smoker followed in our office for sleep apnea, allergic rhinitis, asthma  PMH: Migraine, fibromyalgia, degenerative disc disease, rheumatoid arthritis, Smoker/ Smoking History: Never smoker Maintenance:   Pt of: Dr. Annamaria Boots  03/19/2020  - Visit   44 year old female never smoker followed in our office for sleep apnea, allergic rhinitis.  Patient is followed in our office by Dr. Annamaria Boots. Last office visit by Dr. Annamaria Boots was back in August/2020 this was a virtual visit.  She was doing well at that time BiPAP therapy.  She was encouraged to keep follow-up with neurology for her  Ehlers-Danlos syndrome.  And to follow-up in 1 year.  She is maintained on Advair 250 as well as Adderall extended release 15 mg.  Using once daily.  Questionaires / Pulmonary Flowsheets:   ACT:  No flowsheet data found.  MMRC: No flowsheet data found.  Epworth:  No flowsheet data found.  Tests:   FENO:  No results found for: NITRICOXIDE  PFT: No flowsheet data found.  WALK:  No flowsheet data found.  Imaging: No results found.  Lab Results:  CBC    Component Value Date/Time   WBC 5.6 10/15/2019 1023   WBC 8.8 03/13/2018 1110   RBC 4.44 10/15/2019 1023   RBC 4.89 03/13/2018 1110   HGB 12.8 10/15/2019 1023   HCT 37.9 10/15/2019 1023   PLT 256 10/15/2019 1023   MCV 85 10/15/2019 1023   MCV 92 02/19/2014 0035   MCH 28.8 10/15/2019 1023   MCH 27.3 03/13/2018 1110   MCHC 33.8 10/15/2019 1023   MCHC 33.3 03/13/2018 1110   RDW 13.3 10/15/2019 1023   RDW 13.8 02/19/2014 0035   LYMPHSABS 2.7 10/15/2019 1023   LYMPHSABS 3.5 02/19/2014 0035   MONOABS 0.5 10/28/2017 1744   MONOABS 0.5 02/19/2014 0035   EOSABS 0.3 10/15/2019 1023   EOSABS 0.9 (H) 02/19/2014 0035   BASOSABS 0.0 10/15/2019 1023   BASOSABS 0.1  02/19/2014 0035    BMET    Component Value Date/Time   NA 140 10/15/2019 1023   NA 140 02/19/2014 0035   K 5.0 10/15/2019 1023   K 3.9 02/19/2014 0035   CL 103 10/15/2019 1023   CL 105 02/19/2014 0035   CO2 25 10/15/2019 1023   CO2 29 02/19/2014 0035   GLUCOSE 76 10/15/2019 1023   GLUCOSE 75 10/28/2017 1744   GLUCOSE 86 02/19/2014 0035   BUN 8 10/15/2019 1023   BUN 9 02/19/2014 0035   CREATININE 1.00 10/15/2019 1023   CREATININE 0.82 02/19/2014 0035   CALCIUM 9.0 10/15/2019 1023   CALCIUM 9.0 02/19/2014 0035   GFRNONAA 70 10/15/2019 1023   GFRNONAA >60 02/19/2014 0035   GFRAA 80 10/15/2019 1023   GFRAA >60 02/19/2014 0035    BNP No results found for: BNP  ProBNP No results found for: PROBNP  Specialty Problems      Pulmonary Problems   Allergic asthma, mild persistent, uncomplicated    Proair HFA not effective, as Ventolin worked well for her.      Allergic rhinitis, seasonal   Mixed sleep apnea, Central predominant    NPSG 08/20/17-severe Mixed Sleep apnea (Central predominant)-AHI 134.1./hour, desaturation to 80%, 38.9 minutes recorded <= 88%, body weight 140 pounds, Central Apnea Index 119.8/ hr  Sinusitis      Allergies  Allergen Reactions  . Chocolate Flavor     Other reaction(s): Other (See Comments) Wheezing, acne  . Demerol [Meperidine] Other (See Comments)    Slow to wake up when this drug is given.   Marland Kitchen Keflex [Cephalexin] Nausea And Vomiting  . Morphine And Related Nausea And Vomiting  . Toradol [Ketorolac Tromethamine] Nausea And Vomiting  . Augmentin  [Amoxicillin-Pot Clavulanate]   . Chocolate     GI distress  . Nsaids     patient develops ulcers  . Strawberry Extract     GI distress  . Tape Dermatitis    Immunization History  Administered Date(s) Administered  . H1N1 06/03/2008  . HPV 9-valent 07/19/2018  . Influenza Split 06/14/2012  . Influenza,inj,Quad PF,6+ Mos 10/01/2018  . Influenza-Unspecified 05/24/2017, 05/16/2019    . Pneumococcal Polysaccharide-23 05/24/2017  . Tdap 06/14/2012    Past Medical History:  Diagnosis Date  . Ehlers-Danlos syndrome   . Fibromyalgia   . Headache   . Sleep apnea   . Tics of organic origin   . Transient alteration of awareness     Tobacco History: Social History   Tobacco Use  Smoking Status Never Smoker  Smokeless Tobacco Never Used   Counseling given: Not Answered   Continue to not smoke  Outpatient Encounter Medications as of 03/19/2020  Medication Sig  . albuterol (VENTOLIN HFA) 108 (90 Base) MCG/ACT inhaler Inhale 2 puffs into the lungs every 6 (six) hours as needed.  Marland Kitchen amphetamine-dextroamphetamine (ADDERALL XR) 15 MG 24 hr capsule Take 1 capsule by mouth every morning.  Marland Kitchen azelastine (OPTIVAR) 0.05 % ophthalmic solution Apply to eye as directed. 1 drop in each eye twice daily  . busPIRone (BUSPAR) 10 MG tablet TAKE 1 TABLET BY MOUTH TWICE A DAY  . celecoxib (CELEBREX) 200 MG capsule TAKE 1 CAPSULE BY MOUTH TWICE A DAY  . DEXILANT 30 MG capsule TAKE 1 CAPSULE BY MOUTH EVERY DAY  . diclofenac Sodium (VOLTAREN) 1 % GEL APPLY AS NEEDED 3 TIMES A DAY  . DULoxetine (CYMBALTA) 60 MG capsule TAKE 2 CAPSULES BY MOUTH DAILY  . fentaNYL (DURAGESIC) 100 MCG/HR Place 1 patch onto the skin every 3 (three) days.  . fexofenadine (ALLEGRA) 180 MG tablet Take 180 mg by mouth daily.  . fluticasone (FLONASE) 50 MCG/ACT nasal spray SPRAY 2 SPRAYS INTO EACH NOSTRIL EVERY DAY  . Fluticasone-Salmeterol (ADVAIR) 250-50 MCG/DOSE AEPB Inhale 1 puff into the lungs every 12 (twelve) hours. Rinse mouth after use  . gabapentin (NEURONTIN) 600 MG tablet TAKE 2 TABLETS IN THE MORNING AND 3 TABLETS AT NIGHTTIME  . haloperidol (HALDOL) 0.5 MG tablet Take 0.5 tablets (0.25 mg total) by mouth 2 (two) times daily.  Marland Kitchen loratadine (CLARITIN) 10 MG tablet Take 10 mg by mouth daily.  . ondansetron (ZOFRAN) 4 MG tablet Take 1 tablet (4 mg total) by mouth daily as needed for nausea or vomiting.  .  ondansetron (ZOFRAN-ODT) 4 MG disintegrating tablet DISSOLVE 1 TABLET IN MOUTH EVERY 8 HOURS FOR NAUSEA AND VOMITING  . pregabalin (LYRICA) 300 MG capsule TAKE 1 CAPSULE BY MOUTH TWICE A DAY  . QUEtiapine (SEROQUEL) 200 MG tablet TAKE 1 TABLET (200 MG TOTAL) BY MOUTH AT BEDTIME.  . tamsulosin (FLOMAX) 0.4 MG CAPS capsule Take 1 capsule (0.4 mg total) by mouth daily.  Marland Kitchen tiZANidine (ZANAFLEX) 4 MG tablet Take 0.5-1 tablets (2-4 mg total) by mouth every 8 (eight) hours as needed for muscle spasms.  No facility-administered encounter medications on file as of 03/19/2020.     Review of Systems  Review of Systems   Physical Exam  There were no vitals taken for this visit.  Wt Readings from Last 5 Encounters:  11/27/19 159 lb (72.1 kg)  06/13/19 145 lb 4.5 oz (65.9 kg)  11/06/18 140 lb (63.5 kg)  10/01/18 153 lb 12.8 oz (69.8 kg)  08/10/18 145 lb (65.8 kg)    BMI Readings from Last 5 Encounters:  11/27/19 31.58 kg/m  06/13/19 29.34 kg/m  11/06/18 27.80 kg/m  10/01/18 30.54 kg/m  08/10/18 28.80 kg/m     Physical Exam    Assessment & Plan:   No problem-specific Assessment & Plan notes found for this encounter.    No follow-ups on file.   Lauraine Rinne, NP 03/19/2020   This appointment required *** minutes of patient care (this includes precharting, chart review, review of results, face-to-face care, etc.).

## 2020-03-23 ENCOUNTER — Other Ambulatory Visit: Payer: Self-pay | Admitting: Family Medicine

## 2020-03-23 DIAGNOSIS — J0110 Acute frontal sinusitis, unspecified: Secondary | ICD-10-CM | POA: Diagnosis not present

## 2020-03-23 MED ORDER — FENTANYL 100 MCG/HR TD PT72: 1.0000 | MEDICATED_PATCH | TRANSDERMAL | 0 refills | Status: DC

## 2020-03-23 NOTE — Telephone Encounter (Signed)
Medication Refill - Medication: fentaNYL (DURAGESIC) 100 MCG/HR    Preferred Pharmacy (with phone number or street name):  [id number] [name], [place] Phone:  [phone]  Fax:  [phone]       Agent: Please be advised that RX refills may take up to 3 business days. We ask that you follow-up with your pharmacy.

## 2020-03-23 NOTE — Telephone Encounter (Signed)
Requested medication (s) are due for refill today: no  Requested medication (s) are on the active medication list: yes  Last refill:  03/10/2020  Future visit scheduled: no  Notes to clinic:  this refill cannot be delegated    Requested Prescriptions  Pending Prescriptions Disp Refills   fentaNYL (DURAGESIC) 100 MCG/HR 10 patch 0    Sig: Place 1 patch onto the skin every 3 (three) days.      Not Delegated - Analgesics:  Opioid Agonists Failed - 03/23/2020 10:00 AM      Failed - This refill cannot be delegated      Failed - Urine Drug Screen completed in last 360 days.      Passed - Valid encounter within last 6 months    Recent Outpatient Visits           3 weeks ago Sinusitis, unspecified chronicity, unspecified location   [facility] Birdie Sons, MD   5 months ago Rheumatoid arthritis, involving unspecified site, unspecified whether rheumatoid factor present [facility])   [facility] Birdie Sons, MD   5 months ago Breast lump on left side at 1 o'clock position   [facility] Flinchum, Kelby Aline, FNP   1 year ago Mild persistent asthma with exacerbation   [facility] Birdie Sons, MD   1 year ago [place], [place]       Future Appointments             In 9 months MacDiarmid, Nicki Reaper, [facility]

## 2020-03-30 DIAGNOSIS — J0111 Acute recurrent frontal sinusitis: Secondary | ICD-10-CM | POA: Diagnosis not present

## 2020-03-30 DIAGNOSIS — H903 Sensorineural hearing loss, bilateral: Secondary | ICD-10-CM | POA: Diagnosis not present

## 2020-03-30 DIAGNOSIS — J329 Chronic sinusitis, unspecified: Secondary | ICD-10-CM | POA: Diagnosis not present

## 2020-04-02 ENCOUNTER — Other Ambulatory Visit: Payer: Self-pay | Admitting: Internal Medicine

## 2020-04-02 MED ORDER — FLUTICASONE-SALMETEROL 250-50 MCG/DOSE IN AEPB: 1.0000 | INHALATION_SPRAY | Freq: Two times a day (BID) | RESPIRATORY_TRACT | 11 refills | Status: DC

## 2020-04-15 ENCOUNTER — Telehealth: Payer: Self-pay | Admitting: Internal Medicine

## 2020-04-15 NOTE — Telephone Encounter (Signed)
Spoke with patient regarding refill for Adderall XR 15mg  last filled 03/13/20 last office visit 03/18/19.Dr Annamaria Boots can you please advise.

## 2020-04-16 MED ORDER — AMPHETAMINE-DEXTROAMPHET ER 15 MG PO CP24: 15.0000 mg | ORAL_CAPSULE | ORAL | 0 refills | Status: DC

## 2020-04-16 NOTE — Telephone Encounter (Signed)
I think adderall refill went through. I got an error message. Let me know if I need to resend.

## 2020-04-21 ENCOUNTER — Other Ambulatory Visit: Payer: Self-pay | Admitting: Family Medicine

## 2020-04-21 DIAGNOSIS — J329 Chronic sinusitis, unspecified: Secondary | ICD-10-CM | POA: Diagnosis not present

## 2020-04-21 NOTE — Telephone Encounter (Signed)
Patient requesting fentaNYL ([place]) 100 MCG/HR and states she needs patches by Sept. 9th, 2021, informed patient the turn around time is 48 to 72 hour turn around time   [id number] [name], [place] Phone:  [phone]  Fax:  [phone]

## 2020-04-21 NOTE — Telephone Encounter (Signed)
Requested medication (s) are due for refill today: yes   Requested medication (s) are on the active medication list: yes   Last refill: 03/23/2020  Future visit scheduled: no  Notes to clinic:  This refill cannot be delegated   Requested Prescriptions  Pending Prescriptions Disp Refills   fentaNYL (DURAGESIC) 100 MCG/HR 10 patch 0    Sig: Place 1 patch onto the skin every 3 (three) days.      Not Delegated - Analgesics:  Opioid Agonists Failed - 04/21/2020 12:53 PM      Failed - This refill cannot be delegated      Failed - Urine Drug Screen completed in last 360 days.      Passed - Valid encounter within last 6 months    Recent Outpatient Visits           1 month ago Sinusitis, unspecified chronicity, unspecified location   [facility] Birdie Sons, MD   6 months ago Rheumatoid arthritis, involving unspecified site, unspecified whether rheumatoid factor present [facility])   [facility] Birdie Sons, MD   6 months ago Breast lump on left side at 1 o'clock position   [facility] Flinchum, Kelby Aline, FNP   1 year ago Mild persistent asthma with exacerbation   [facility] Birdie Sons, MD   1 year ago [place], [place]       Future Appointments             In 8 months MacDiarmid, Nicki Reaper, [place]

## 2020-04-22 MED ORDER — FENTANYL 100 MCG/HR TD PT72: 1.0000 | MEDICATED_PATCH | TRANSDERMAL | 0 refills | Status: DC

## 2020-04-22 NOTE — Telephone Encounter (Signed)
Spoke with patient this afternoon regarding refill on Adderall. Asked patient is she got he medication hat was sent per Dr.Young was not sure if he script was sent in to patient's pharmacy. It looks like on our end I didn't see a error message. Advised patient to  call office if her refill has no made it to the pharmacy . Patient's voice was understanding.

## 2020-05-04 DIAGNOSIS — M79675 Pain in left toe(s): Secondary | ICD-10-CM | POA: Diagnosis not present

## 2020-05-18 DIAGNOSIS — M47814 Spondylosis without myelopathy or radiculopathy, thoracic region: Secondary | ICD-10-CM | POA: Diagnosis not present

## 2020-05-18 DIAGNOSIS — M791 Myalgia, unspecified site: Secondary | ICD-10-CM | POA: Diagnosis not present

## 2020-05-19 ENCOUNTER — Other Ambulatory Visit: Payer: Self-pay | Admitting: Family Medicine

## 2020-05-19 NOTE — Telephone Encounter (Signed)
Copied from [place] [id number]. Topic: Quick Communication - Rx Refill/Question >> May 19, 2020  4:31 PM Yvette Rack wrote: Medication: fentaNYL ([place]) 100 MCG/HR  Has the patient contacted their pharmacy? no  Preferred Pharmacy (with phone number or street name): [id number] [name], [place] Phone: [phone] Fax: [phone]  Agent: Please be advised that RX refills may take up to 3 business days. We ask that you follow-up with your pharmacy.

## 2020-05-19 NOTE — Telephone Encounter (Signed)
Requested medication (s) are due for refill today: yes  Requested medication (s) are on the active medication list: yes  Last refill:  04/22/20 #10 patch 0 refills   Future visit scheduled: yes with urology  Notes to clinic:  not delegated per protocol      Requested Prescriptions  Pending Prescriptions Disp Refills   fentaNYL (DURAGESIC) 100 MCG/HR 10 patch 0    Sig: Place 1 patch onto the skin every 3 (three) days.      Not Delegated - Analgesics:  Opioid Agonists Failed - 05/19/2020  4:35 PM      Failed - This refill cannot be delegated      Failed - Urine Drug Screen completed in last 360 days.      Passed - Valid encounter within last 6 months    Recent Outpatient Visits           2 months ago Sinusitis, unspecified chronicity, unspecified location   [facility] Birdie Sons, MD   6 months ago Rheumatoid arthritis, involving unspecified site, unspecified whether rheumatoid factor present [facility])   [facility] Birdie Sons, MD   7 months ago Breast lump on left side at 1 o'clock position   [facility] Flinchum, Kelby Aline, FNP   1 year ago Mild persistent asthma with exacerbation   [facility] Birdie Sons, MD   1 year ago [place], [place]       Future Appointments             In 7 months MacDiarmid, Nicki Reaper, [facility]

## 2020-05-20 ENCOUNTER — Telehealth: Payer: Self-pay | Admitting: Internal Medicine

## 2020-05-20 ENCOUNTER — Telehealth: Payer: Self-pay | Admitting: Family Medicine

## 2020-05-20 NOTE — Telephone Encounter (Signed)
Spoke to patient, who is requesting refill on Adderall 15mg . Last refilled 04/16/2020 #31 with 0 refill.  Last seen 04/14/2019 with pending appointment for 07/07/2020. [facility] [address] in [place].  Dr. Annamaria Boots, please advise. Thanks

## 2020-05-20 NOTE — Telephone Encounter (Signed)
Disregard

## 2020-05-21 MED ORDER — FENTANYL 100 MCG/HR TD PT72: 1.0000 | MEDICATED_PATCH | TRANSDERMAL | 0 refills | Status: DC

## 2020-05-21 MED ORDER — AMPHETAMINE-DEXTROAMPHET ER 15 MG PO CP24: 15.0000 mg | ORAL_CAPSULE | ORAL | 0 refills | Status: DC

## 2020-05-21 NOTE — Telephone Encounter (Signed)
Adderall refill e-sent 

## 2020-05-26 DIAGNOSIS — Z23 Encounter for immunization: Secondary | ICD-10-CM | POA: Diagnosis not present

## 2020-05-27 ENCOUNTER — Other Ambulatory Visit: Payer: Self-pay | Admitting: Family Medicine

## 2020-05-28 ENCOUNTER — Ambulatory Visit (INDEPENDENT_AMBULATORY_CARE_PROVIDER_SITE_OTHER): Payer: Medicare Other | Admitting: Pediatrics

## 2020-06-04 ENCOUNTER — Encounter (INDEPENDENT_AMBULATORY_CARE_PROVIDER_SITE_OTHER): Payer: Self-pay | Admitting: Pediatrics

## 2020-06-04 ENCOUNTER — Other Ambulatory Visit: Payer: Self-pay

## 2020-06-04 ENCOUNTER — Ambulatory Visit (INDEPENDENT_AMBULATORY_CARE_PROVIDER_SITE_OTHER): Payer: Medicare Other | Admitting: Pediatrics

## 2020-06-04 VITALS — BP 132/90 | HR 88 | Ht 59.5 in | Wt 137.0 lb

## 2020-06-04 DIAGNOSIS — Q796 Ehlers-Danlos syndrome, unspecified: Secondary | ICD-10-CM

## 2020-06-04 DIAGNOSIS — G43009 Migraine without aura, not intractable, without status migrainosus: Secondary | ICD-10-CM

## 2020-06-04 DIAGNOSIS — G2569 Other tics of organic origin: Secondary | ICD-10-CM | POA: Diagnosis not present

## 2020-06-04 DIAGNOSIS — G4710 Hypersomnia, unspecified: Secondary | ICD-10-CM

## 2020-06-04 DIAGNOSIS — G44221 Chronic tension-type headache, intractable: Secondary | ICD-10-CM | POA: Diagnosis not present

## 2020-06-04 DIAGNOSIS — G4739 Other sleep apnea: Secondary | ICD-10-CM

## 2020-06-04 DIAGNOSIS — M797 Fibromyalgia: Secondary | ICD-10-CM

## 2020-06-04 DIAGNOSIS — G44229 Chronic tension-type headache, not intractable: Secondary | ICD-10-CM | POA: Insufficient documentation

## 2020-06-04 MED ORDER — GABAPENTIN 600 MG PO TABS: ORAL_TABLET | ORAL | 5 refills | Status: DC

## 2020-06-04 MED ORDER — HALOPERIDOL 0.5 MG PO TABS: ORAL_TABLET | ORAL | 5 refills | Status: DC
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
## 2020-06-04 NOTE — Patient Instructions (Signed)
Thank you for coming today.  I think that the increased dose of haloperidol may help to control your tics.  I want you to try very hard to go to bed at the same time somewhere between 1 AM and 2 AM rather than later.  You need to try to go to bed for your so sleepy that she fall asleep while watching TV.  If you go to bed before you get extremely sleepy, you will have your CPAP in place and fall asleep naturally and in general doing better.  I am pleased that you have identified an adult neurologist for transition of care.  I will speak to Dr. Tomi Likens sometime this spring and still plan to see you in 6 months.  I would like to have him see you before I retire in September 2022.

## 2020-06-16 ENCOUNTER — Other Ambulatory Visit: Payer: Self-pay | Admitting: Family Medicine

## 2020-06-16 NOTE — Telephone Encounter (Signed)
Patient requesting fentaNYL (DURAGESIC) 100 MCG/HR

## 2020-06-19 MED ORDER — FENTANYL 100 MCG/HR TD PT72
1.0000 | MEDICATED_PATCH | TRANSDERMAL | 0 refills | Status: DC
Start: 2020-06-19 — End: 2020-07-16

## 2020-06-19 NOTE — Telephone Encounter (Signed)
PT calling back to f/up / she's out of her patches / please advise

## 2020-06-19 NOTE — Telephone Encounter (Signed)
Please review. Thanks!  

## 2020-06-22 ENCOUNTER — Telehealth: Payer: Self-pay | Admitting: Internal Medicine

## 2020-06-22 MED ORDER — AMPHETAMINE-DEXTROAMPHET ER 15 MG PO CP24
15.0000 mg | ORAL_CAPSULE | ORAL | 0 refills | Status: DC
Start: 2020-06-22 — End: 2020-07-28

## 2020-06-22 NOTE — Telephone Encounter (Signed)
Adderall refill e-sent

## 2020-06-22 NOTE — Telephone Encounter (Signed)
Spoke to patient, who is requesting refill on Adderall 15mg .  Last refilled on 05/21/2020 #31 with 0 refills.  Patient last seen 03/18/2019 with pending OV for 06/29/2020.  Dr. Annamaria Boots, please advise. Thanks  Current Outpatient Medications on File Prior to Visit  Medication Sig Dispense Refill   albuterol (VENTOLIN HFA) 108 (90 Base) MCG/ACT inhaler Inhale 2 puffs into the lungs every 6 (six) hours as needed. 8 g 12   amphetamine-dextroamphetamine (ADDERALL XR) 15 MG 24 hr capsule Take 1 capsule by mouth every morning. 31 capsule 0   azelastine (OPTIVAR) 0.05 % ophthalmic solution Apply to eye as directed. 1 drop in each eye twice daily     busPIRone (BUSPAR) 10 MG tablet TAKE 1 TABLET BY MOUTH TWICE A DAY 180 tablet 2   celecoxib (CELEBREX) 200 MG capsule TAKE 1 CAPSULE BY MOUTH TWICE A DAY 60 capsule 11   DEXILANT 30 MG capsule TAKE 1 CAPSULE BY MOUTH EVERY DAY 30 capsule 11   diclofenac Sodium (VOLTAREN) 1 % GEL APPLY AS NEEDED 3 TIMES A DAY 100 g 0   DULoxetine (CYMBALTA) 60 MG capsule TAKE 2 CAPSULES BY MOUTH DAILY 180 capsule 4   fentaNYL (DURAGESIC) 100 MCG/HR Place 1 patch onto the skin every 3 (three) days. 10 patch 0   fexofenadine (ALLEGRA) 180 MG tablet Take 180 mg by mouth daily.     fluticasone (FLONASE) 50 MCG/ACT nasal spray SPRAY 2 SPRAYS INTO EACH NOSTRIL EVERY DAY 16 g 2   Fluticasone-Salmeterol (ADVAIR) 250-50 MCG/DOSE AEPB Inhale 1 puff into the lungs every 12 (twelve) hours. Rinse mouth after use 60 each 11   gabapentin (NEURONTIN) 600 MG tablet Take 2 tablets in the morning and 3 tablets at bedtime 155 tablet 5   haloperidol (HALDOL) 0.5 MG tablet Take 1 tablet twice daily 62 tablet 5   loratadine (CLARITIN) 10 MG tablet Take 10 mg by mouth daily.     ondansetron (ZOFRAN-ODT) 4 MG disintegrating tablet DISSOLVE 1 TABLET IN MOUTH EVERY 8 HOURS FOR NAUSEA AND VOMITING 30 tablet 5   pregabalin (LYRICA) 300 MG capsule TAKE 1 CAPSULE BY MOUTH TWICE A DAY 180  capsule 5   QUEtiapine (SEROQUEL) 200 MG tablet TAKE 1 TABLET (200 MG TOTAL) BY MOUTH AT BEDTIME. 90 tablet 4   tamsulosin (FLOMAX) 0.4 MG CAPS capsule Take 1 capsule (0.4 mg total) by mouth daily. 90 capsule 3   tiZANidine (ZANAFLEX) 4 MG tablet Take 0.5-1 tablets (2-4 mg total) by mouth every 8 (eight) hours as needed for muscle spasms. 90 tablet 4   No current facility-administered medications on file prior to visit.    Allergies  Allergen Reactions   Chocolate Flavor     Other reaction(s): Other (See Comments) Wheezing, acne   Demerol [Meperidine] Other (See Comments)    Slow to wake up when this drug is given.    Keflex [Cephalexin] Nausea And Vomiting   Morphine And Related Nausea And Vomiting   Toradol [Ketorolac Tromethamine] Nausea And Vomiting   Augmentin  [Amoxicillin-Pot Clavulanate]    Chocolate     GI distress   Nsaids     patient develops ulcers   Strawberry Extract     GI distress   Tape Dermatitis

## 2020-06-22 NOTE — Telephone Encounter (Signed)
Patient is aware of below message and voiced her understanding.  Nothing further needed.   

## 2020-06-28 NOTE — Progress Notes (Deleted)
HPI  female never smoker referred by Dr. Gaynell Face for management of severe mixed sleep apnea (central predominant), chronic hypoxic and hypercapnic respiratory failure, complicated by Ehlers-Danlos syndrome, fibromyalgia, seasonal allergic rhinitis, asthma, degenerative disc disease, rheumatoid arthritis, question of past seizure disorder, Tics BiPAP ST titration sleep study 10/11/2017-  Titrated to 24/ 13, back up rate 16 with minimal O2 sat 87%. Residual CAI 13.7/ hr NPSG 08/20/17-severe Mixed Sleep apnea (Central predominant)-AHI 134.1./hour, desaturation to 80%, 38.9 minutes recorded <= 88%, body weight 140 pounds, Central Apnea Index 119.8/ hr Office Spirometry 09/29/17-mild restriction of exhaled volume.  Flow is normal for volume.  FVC 2.29/74%, FEV1 2.0/78%, ratio 2.87, FEF 25-75% 2.87/101%    -------------------------------------------------------------------------------------    (N.B.---Our next step if BiPAP ST proves insufficient, would be to look at [facility], perhaps EPAP 6, minimum PS 5, maximum PS 12) .  03/18/2019- Virtual Visit via Telephone Note  History of Present Illness: 44 year old female never smoker referred by Dr. Gaynell Face for management of severe Mixed Sleep Apnea (central predominant), chronic hypoxic and hypercapnic Respiratory Failure, complicated by Ehlers-Danlos syndrome, fibromyalgia, seasonal allergic rhinitis, asthma, degenerative disc disease, rheumatoid arthritis, question of past seizure disorder, Tics BIPAP ST 24/13, back up rate 16/ [employer], continuing adderall xr 15, and refit mask  -----OSA on BiPAP 24/13, DME: [employer], no complaints, no new or worsening symptoms Adderall XR 15 mg works fine, once daily Advair 250 and albuterol rescue( 1x daily) also doing well.  She feels she is doing well with her Bipap Spontaneous Timed (AirCurve 10 ST) machine at current settings.  Breathing feels clear with Advair and albuterol hfa. No acute  issues.  Observations/Objective: Download compliance 90%, AHI 3.8/ hr   Assessment and Plan: Mixed Sleep Apnea-  Doing well with ventilatory assist from BiPap ST- continue present settings. Chronic Resp Failure- Hypoxic and Hypercapneic Supported by her BIPAP Asthma mild persistent uncomplicated- meds work well Ehlers-Danlos- managed by Neurology/ Dr Gaynell Face  Follow Up Instructions: 1 year   06/29/20- 44 year old female never smoker referred by Dr. Gaynell Face for management of severe Mixed Sleep Apnea (central predominant), chronic hypoxic and hypercapnic Respiratory Failure, complicated by Ehlers-Danlos syndrome, fibromyalgia, seasonal allergic rhinitis, asthma, degenerative disc disease, rheumatoid arthritis, question of past seizure disorder, Tics BIPAP ST 24/13, back up rate 16/ [employer], continuing adderall xr 15, Download- Body weight today- Covid vax- Flu vax-    ROS-see HPI  + = positive Constitutional:    weight loss, night sweats, fevers, chills, +fatigue, lassitude. HEENT:    headaches, difficulty swallowing, tooth/dental problems, sore throat,       sneezing, itching, ear ache, nasal congestion, post nasal drip, snoring CV:    chest pain, orthopnea, PND, swelling in lower extremities, anasarca,                                                     dizziness, palpitations Resp:  + shortness of breath with exertion or at rest.                productive cough,   non-productive cough, coughing up of blood.              change in color of mucus.  +wheezing.   Skin:    rash or lesions. GI:  No-   heartburn, indigestion, abdominal pain, nausea, vomiting, diarrhea,  change in bowel habits, loss of appetite GU: dysuria, change in color of urine, no urgency or frequency.   flank pain. MS:   joint pain, stiffness, decreased range of motion, back pain. Neuro-     + HPI Psych:  change in mood or affect.  depression or anxiety.   memory loss.  OBJ- Physical  Exam General- Awake, Oriented, Affect-appropriate, Distress- none acute,                 +Eyelid droop/ flaccid face make her look drowsy. +Power chair.  Skin- rash-none, lesions- none, excoriation- none Lymphadenopathy- none Head- atraumatic            Eyes- Gross vision intact, PERRLA, conjunctivae and secretions clear            Ears- Hearing, canals-normal            Nose- + mild turbinate edemea, +Septal dev, mucus, polyps, erosion, perforation             Throat- Mallampati III , mucosa clear , drainage- none, tonsils- atrophic Neck- flexible , trachea midline, no stridor , thyroid nl, carotid no bruit Chest - symmetrical excursion , unlabored           Heart/CV- RRR , no murmur heard , no gallop  , no rub, nl s1 s2                           - JVD- none , edema- none, stasis changes- none, varices- none           Lung- clear to P&A, wheeze- none, cough+ mild, dullness-none, rub- none           Chest wall-  Abd-  Br/ Gen/ Rectal- Not done, not indicated Extrem- cyanosis- none, clubbing, none, atrophy- none, strength- nl Neuro-+ very little muscle movement, slight face, but mentation seems appropriate.

## 2020-06-29 ENCOUNTER — Ambulatory Visit: Payer: Medicare Other | Admitting: Internal Medicine

## 2020-06-30 ENCOUNTER — Other Ambulatory Visit: Payer: Self-pay | Admitting: Family Medicine

## 2020-06-30 MED ORDER — PREGABALIN 300 MG PO CAPS
300.0000 mg | ORAL_CAPSULE | Freq: Two times a day (BID) | ORAL | 5 refills | Status: DC
Start: 2020-06-30 — End: 2020-12-30

## 2020-06-30 NOTE — Telephone Encounter (Signed)
Medication Refill - Medication: Pregabalin  Has the patient contacted their pharmacy? No. (Agent: If no, request that the patient contact the pharmacy for the refill.) (Agent: If yes, when and what did the pharmacy advise?)  Preferred Pharmacy (with phone number or street name): [id number] [place] [place]: Please be advised that RX refills may take up to 3 business days. We ask that you follow-up with your pharmacy.

## 2020-07-16 ENCOUNTER — Other Ambulatory Visit: Payer: Self-pay | Admitting: Family Medicine

## 2020-07-16 NOTE — Telephone Encounter (Signed)
Requested medication (s) are due for refill today: yes  Requested medication (s) are on the active medication list:yes  Last refill:  06/19/2020  Future visit scheduled: no  Notes to clinic:  this refill cannot be delegated    Requested Prescriptions  Pending Prescriptions Disp Refills   fentaNYL (DURAGESIC) 100 MCG/HR 10 patch 0    Sig: Place 1 patch onto the skin every 3 (three) days.      Not Delegated - Analgesics:  Opioid Agonists Failed - 07/16/2020 12:46 PM      Failed - This refill cannot be delegated      Failed - Urine Drug Screen completed in last 360 days      Passed - Valid encounter within last 6 months    Recent Outpatient Visits           4 months ago Sinusitis, unspecified chronicity, unspecified location   [facility] Birdie Sons, MD   8 months ago Rheumatoid arthritis, involving unspecified site, unspecified whether rheumatoid factor present [facility])   [facility] Birdie Sons, MD   9 months ago Breast lump on left side at 1 o'clock position   [facility] Flinchum, Kelby Aline, FNP   1 year ago Mild persistent asthma with exacerbation   [facility] Birdie Sons, MD   1 year ago [place], [place]       Future Appointments             In 5 months MacDiarmid, Nicki Reaper, [place]

## 2020-07-16 NOTE — Telephone Encounter (Signed)
PT need a refill  fentaNYL ([place]) 100 MCG/HR [[id number]]  [id number] [name], [place]  [place] [address]  Phone: [phone] Fax: [phone]

## 2020-07-17 MED ORDER — FENTANYL 100 MCG/HR TD PT72
1.0000 | MEDICATED_PATCH | TRANSDERMAL | 0 refills | Status: DC
Start: 2020-07-17 — End: 2020-08-18

## 2020-07-17 NOTE — Telephone Encounter (Signed)
Patient is calling to check on the status of the fentanyl patches. Please advise CB- [phone]

## 2020-07-27 ENCOUNTER — Telehealth: Payer: Self-pay | Admitting: Internal Medicine

## 2020-07-27 NOTE — Telephone Encounter (Signed)
Attempted to call patient to set up an appointment since she has not been seen since 03/2019. Left message to call office back to set that up

## 2020-07-27 NOTE — Telephone Encounter (Signed)
Requests refill of Adderrall  Last ov 03/2019.

## 2020-07-28 MED ORDER — AMPHETAMINE-DEXTROAMPHET ER 15 MG PO CP24
15.0000 mg | ORAL_CAPSULE | ORAL | 0 refills | Status: DC
Start: 2020-07-28 — End: 2020-09-07

## 2020-07-28 NOTE — Telephone Encounter (Signed)
ATC patient to let her know that refill was sent in and that we still needed to get her scheduled for an appointment with Dr. Annamaria Boots since its been over a year. LMTCB

## 2020-07-28 NOTE — Telephone Encounter (Signed)
Adderall refill e-sent Thanks

## 2020-08-03 ENCOUNTER — Telehealth: Payer: Self-pay | Admitting: Family Medicine

## 2020-08-03 NOTE — Telephone Encounter (Signed)
Copied from [place] [id number]. Topic: Medicare AWV >> Aug 03, 2020 11:09 AM Cher Nakai R wrote: Reason for CRM:  Left message for patient to call back and schedule Medicare Annual Wellness Visit (AWV) in office.   If not able to come in office, please offer to do virtually.   No hx of AWV - AWV-I eligible as of  08/15/2009   Please schedule at anytime with [facility] Health Advisor.   40 minute appointment  Any questions, please contact me at [phone]

## 2020-08-03 NOTE — Telephone Encounter (Signed)
lmtcb for pt. Pt needs OV with CY.

## 2020-08-05 ENCOUNTER — Encounter: Payer: Self-pay | Admitting: Emergency Medicine

## 2020-08-05 NOTE — Telephone Encounter (Signed)
Due to two unsuccessful attempts to reach pt with no return call, message will be closed.  Letter has been created for pt to return call to schedule appt.    Triage, please print and mail letter (letter already created). Can close encounter once complete.

## 2020-08-14 ENCOUNTER — Other Ambulatory Visit: Payer: Self-pay | Admitting: Family Medicine

## 2020-08-18 ENCOUNTER — Other Ambulatory Visit: Payer: Self-pay | Admitting: Family Medicine

## 2020-08-18 MED ORDER — FENTANYL 100 MCG/HR TD PT72
1.0000 | MEDICATED_PATCH | TRANSDERMAL | 0 refills | Status: DC
Start: 2020-08-18 — End: 2020-09-14

## 2020-08-18 NOTE — Telephone Encounter (Signed)
Copied from CRM [id number]. Topic: Quick Communication - Rx Refill/Question >> Aug 18, 2020  9:23 AM Marylen Ponto wrote: Medication: fentaNYL (DURAGESIC) 100 MCG/HR  Has the patient contacted their pharmacy? Yes - pt told to call provider  Preferred Pharmacy (with phone number or street name[id number] [place], [place] [address] Phone: [phone]  Fax: [phone]  Agent: Please be advised that RX refills may take up to 3 business days. We ask that you follow-up with your pharmacy.

## 2020-08-18 NOTE — Telephone Encounter (Signed)
Requested medication (s) are due for refill today: yes  Requested medication (s) are on the active medication list: yes  Last refill:  07/17/20 10 patches  Future visit scheduled: no  Notes to clinic:  Please review for refill. Refill not delegated per protocol.    Requested Prescriptions  Pending Prescriptions Disp Refills   fentaNYL (DURAGESIC) 100 MCG/HR 10 patch 0    Sig: Place 1 patch onto the skin every 3 (three) days.      Not Delegated - Analgesics:  Opioid Agonists Failed - 08/18/2020  9:27 AM      Failed - This refill cannot be delegated      Failed - Urine Drug Screen completed in last 360 days      Passed - Valid encounter within last 6 months    Recent Outpatient Visits           5 months ago Sinusitis, unspecified chronicity, unspecified location   [facility] Malva Limes, MD   10 months ago Rheumatoid arthritis, involving unspecified site, unspecified whether rheumatoid factor present [facility])   [facility] Malva Limes, MD   10 months ago Breast lump on left side at 1 o'clock position   [facility] Flinchum, Eula Fried, FNP   2 years ago Mild persistent asthma with exacerbation   [facility] Malva Limes, MD   2 years ago Bronchitis   [facility] [place], Molly [place], [place]       Future Appointments             In 4 months MacDiarmid, Lorin Picket, MD [facility]

## 2020-09-03 ENCOUNTER — Telehealth: Payer: Self-pay | Admitting: Internal Medicine

## 2020-09-03 NOTE — Telephone Encounter (Signed)
Called and spoke with pt letting her know that CY can take care of refill of Adderall when she sees him 1/24. Pt verbalized understanding. Nothing further needed.

## 2020-09-03 NOTE — Progress Notes (Signed)
HPI  female never smoker referred by Dr. Gaynell Face for management of severe mixed sleep apnea (central predominant), chronic hypoxic and hypercapnic respiratory failure, complicated by Ehlers-Danlos syndrome, fibromyalgia, seasonal allergic rhinitis, asthma, degenerative disc disease, rheumatoid arthritis, question of past seizure disorder, Tics NPSG 08/20/17-severe Mixed Sleep apnea (Central predominant)-AHI 134.1./hour, desaturation to 80%, 38.9 minutes recorded <= 88%, body weight 140 pounds, Central Apnea Index 119.8/ hr BiPAP ST titration sleep study 10/11/2017-  Titrated to 24/ 13, back up rate 16 with minimal O2 sat 87%. Residual CAI 13.7/ hr Office Spirometry 09/29/17-mild restriction of exhaled volume.  Flow is normal for volume.  FVC 2.29/74%, FEV1 2.0/78%, ratio 2.87, FEF 25-75% 2.87/101%    =============================================================================    (N.B.---Our next step if BiPAP ST proves insufficient, would be to look at [facility], perhaps EPAP 6, minimum PS 5, maximum PS 12)    03/18/2019- Virtual Visit via Telephone Note  History of Present Illness: 45 year old female never smoker referred by Dr. Gaynell Face for management of severe Mixed Sleep Apnea (central predominant), chronic hypoxic and hypercapnic Respiratory Failure, complicated by Ehlers-Danlos syndrome, fibromyalgia, seasonal allergic rhinitis, asthma, degenerative disc disease, rheumatoid arthritis, question of past seizure disorder, Tics BIPAP ST 24/13, back up rate 16/ [employer], continuing adderall xr 15, and refit mask  -----OSA on BiPAP 24/13, DME: [employer], no complaints, no new or worsening symptoms Adderall XR 15 mg works fine, once daily Advair 250 and albuterol rescue( 1x daily) also doing well.  She feels she is doing well with her Bipap Spontaneous Timed (AirCurve 10 ST) machine at current settings.  Breathing feels clear with Advair and albuterol hfa. No acute  issues.  Observations/Objective: Download compliance 90%, AHI 3.8/ hr   Assessment and Plan: Mixed Sleep Apnea-  Doing well with ventilatory assist from BiPap ST- continue present settings. Chronic Resp Failure- Hypoxic and Hypercapneic Supported by her BIPAP Asthma mild persistent uncomplicated- meds work well Ehlers-Danlos- managed by Neurology/ Dr Gaynell Face  Follow Up Instructions: 1 year    09/07/20- 45 year old female never smoker referred by Dr. Gaynell Face for management of severe Mixed Sleep Apnea (central predominant), chronic hypoxic and hypercapnic Respiratory Failure, complicated by Ehlers-Danlos syndrome( Dr Gaynell Face), fibromyalgia, seasonal allergic rhinitis, asthma, degenerative disc disease, rheumatoid arthritis, question of past seizure disorder, Tics -Adderall XR 15 1 each AM, Advair 250, Ventolin hfa,  BIPAP ST 24/13, back up rate 16/ [employer], continuing adderall xr 15, and refit mask Download- compliance 20%, AHI 9.3/ hr    Many missed and short nights Body weight today- 140 lbs Covid vax- 3 Phizer Flu vax- had Using AirCurve 10 ST. Was doing much better with it at last visit. Followed long-term by Dr Gaynell Face for her Ehler-Danlos, Migraine and tension HA, Tics. He noted in October that she is falling asleep during the day, then unable to sleep at night.  Dr Caryn Section, PCP, is managing her chronic pain. She notes waking from sleep with mouth open, Denies wheeze and not needing inhaler now. Usually asthma is worst in Spring- seasonal issues discussed.   ROS-see HPI  + = positive Constitutional:    weight loss, night sweats, fevers, chills, +fatigue, lassitude. HEENT:    headaches, difficulty swallowing, tooth/dental problems, sore throat,       sneezing, itching, ear ache, nasal congestion, post nasal drip, snoring CV:    chest pain, orthopnea, PND, swelling in lower extremities, anasarca,  dizziness, palpitations Resp:  +  shortness of breath with exertion or at rest.                productive cough,   non-productive cough, coughing up of blood.              change in color of mucus.  +wheezing.   Skin:    rash or lesions. GI:  No-   heartburn, indigestion, abdominal pain, nausea, vomiting, diarrhea,                 change in bowel habits, loss of appetite GU: dysuria, change in color of urine, no urgency or frequency.   flank pain. MS:   joint pain, stiffness, decreased range of motion, back pain. Neuro-     + HPI Psych:  change in mood or affect.  depression or anxiety.   memory loss.  OBJ- Physical Exam General- Awake, Oriented, Affect-appropriate, Distress- none acute,                 +Eyelid droop/ flaccid face make her look drowsy. +Power chair.  Skin- rash-none, lesions- none, excoriation- none Lymphadenopathy- none Head- atraumatic            Eyes- +proptosis?            Ears- Hearing, canals-normal            Nose- + mild turbinate edemea, +Septal dev, mucus, polyps, erosion, perforation             Throat- Mallampati III , mucosa clear , drainage- none, tonsils- atrophic Neck- flexible , trachea midline, no stridor , thyroid nl, carotid no bruit Chest - symmetrical excursion , unlabored           Heart/CV- RRR , no murmur heard , no gallop  , no rub, nl s1 s2                           - JVD- none , edema- none, stasis changes- none, varices- none           Lung- +slightly raspy L base, wheeze- none, cough-none, dullness-none, rub- none           Chest wall-  Abd-  Br/ Gen/ Rectal- Not done, not indicated Extrem- cyanosis- none, clubbing, none, atrophy- none, strength- nl Neuro-+ very little muscle movement, slight face, but mentation seems appropriate.

## 2020-09-03 NOTE — Telephone Encounter (Signed)
ATC patient. LM to call back.  Patient last OV was 03/18/19.  Patient needs follow up appointment.

## 2020-09-03 NOTE — Telephone Encounter (Signed)
Patient scheduled for appointment with Dr. Annamaria Boots on 09/07/2020. Patient phone number is [phone].

## 2020-09-04 ENCOUNTER — Encounter: Payer: Self-pay | Admitting: Internal Medicine

## 2020-09-04 ENCOUNTER — Other Ambulatory Visit: Payer: Self-pay | Admitting: Family Medicine

## 2020-09-07 ENCOUNTER — Encounter: Payer: Self-pay | Admitting: Internal Medicine

## 2020-09-07 ENCOUNTER — Ambulatory Visit (INDEPENDENT_AMBULATORY_CARE_PROVIDER_SITE_OTHER): Payer: Medicare Other | Admitting: Internal Medicine

## 2020-09-07 ENCOUNTER — Other Ambulatory Visit: Payer: Self-pay

## 2020-09-07 VITALS — BP 120/84 | HR 105 | Temp 98.1°F | Ht 59.5 in | Wt 140.0 lb

## 2020-09-07 DIAGNOSIS — G4739 Other sleep apnea: Secondary | ICD-10-CM

## 2020-09-07 DIAGNOSIS — Q796 Ehlers-Danlos syndrome, unspecified: Secondary | ICD-10-CM | POA: Diagnosis not present

## 2020-09-07 MED ORDER — AMPHETAMINE-DEXTROAMPHET ER 15 MG PO CP24: 15.0000 mg | ORAL_CAPSULE | ORAL | 0 refills | Status: DC

## 2020-09-07 NOTE — Patient Instructions (Signed)
Refill script sent for Adderall   Order- DME [employer] Please replace mask of choice and supplies. Please add chin strap. We can continue BIPAP ST 24/13, back up rate 16  Fifi- please make a real effort to go to bed at 11:30 or Midnight and put your mask on, before you fall asleep. You can read in bed or listen to a radio or something then for awhile, if you want.   Please call if we can help

## 2020-09-14 ENCOUNTER — Other Ambulatory Visit: Payer: Self-pay | Admitting: Family Medicine

## 2020-09-14 MED ORDER — FENTANYL 100 MCG/HR TD PT72: 1.0000 | MEDICATED_PATCH | TRANSDERMAL | 0 refills | Status: DC

## 2020-09-14 NOTE — Telephone Encounter (Signed)
Copied from [place] [id number]. Topic: Quick Communication - Rx Refill/Question >> Sep 14, 2020  9:32 AM Tessa Lerner A wrote: Medication: fentaNYL ([place]) 100 MCG/HR   Has the patient contacted their pharmacy? Yes. Patient was directed to contact PCP  Preferred Pharmacy (with phone number [facility] street name[id number] [place] [place] Phone: [phone]  Agent: Please be advised that RX refills may take up to 3 business days. We ask that you follow-up with your pharmacy.

## 2020-09-14 NOTE — Telephone Encounter (Signed)
Requested medication (s) are due for refill today - unknown  Requested medication (s) are on the active medication list -yes  Future visit scheduled -no  Last refill: 08/18/20  Notes to clinic: Request RF non delegated rx  Requested Prescriptions  Pending Prescriptions Disp Refills   fentaNYL (DURAGESIC) 100 MCG/HR 10 patch 0    Sig: Place 1 patch onto the skin every 3 (three) days.      Not Delegated - Analgesics:  Opioid Agonists Failed - 09/14/2020 10:52 AM      Failed - This refill cannot be delegated      Failed - Urine Drug Screen completed in last 360 days      Failed - Valid encounter within last 6 months    Recent Outpatient Visits           6 months ago Sinusitis, unspecified chronicity, unspecified location   [facility] Birdie Sons, MD   10 months ago Rheumatoid arthritis, involving unspecified site, unspecified whether rheumatoid factor present [facility])   [facility] Birdie Sons, MD   11 months ago Breast lump on left side at 1 o'clock position   [facility] Flinchum, Kelby Aline, [place]   2 years ago Mild persistent asthma with exacerbation   [facility] Birdie Sons, MD   2 years ago [place], [place]       Future Appointments             In 3 months MacDiarmid, Nicki Reaper, MD [facility]                 Requested Prescriptions  Pending Prescriptions Disp Refills   fentaNYL (DURAGESIC) 100 MCG/HR 10 patch 0    Sig: Place 1 patch onto the skin every 3 (three) days.      Not Delegated - Analgesics:  Opioid Agonists Failed - 09/14/2020 10:52 AM      Failed - This refill cannot be delegated      Failed - Urine Drug Screen completed in last 360 days      Failed - Valid encounter within last 6 months    Recent Outpatient Visits           6 months ago Sinusitis, unspecified chronicity, unspecified location   [facility] Birdie Sons, MD   10 months ago Rheumatoid arthritis, involving unspecified site, unspecified whether rheumatoid factor present [facility])   [facility] Birdie Sons, MD   11 months ago Breast lump on left side at 1 o'clock position   [facility] Flinchum, Kelby Aline, FNP   2 years ago Mild persistent asthma with exacerbation   [facility] Birdie Sons, MD   2 years ago [place], [place]       Future Appointments             In 3 months [place], Nicki Reaper, [place]

## 2020-09-29 ENCOUNTER — Other Ambulatory Visit: Payer: Self-pay | Admitting: Family Medicine

## 2020-09-29 NOTE — Telephone Encounter (Signed)
Requested medication (s) are due for refill today: no  Requested medication (s) are on the active medication list: yes  Last refill:  01/31/2020  Future visit scheduled: no  Notes to clinic:  this refill cannot be delegated    Requested Prescriptions  Pending Prescriptions Disp Refills   ondansetron (ZOFRAN-ODT) 4 MG disintegrating tablet [Pharmacy Med Name: ONDANSETRON ODT 4 MG TABLET] 30 tablet 5    Sig: DISSOLVE 1 TABLET IN MOUTH EVERY 8 HOURS FOR NAUSEA AND VOMITING      Not Delegated - Gastroenterology: Antiemetics Failed - 09/29/2020 11:17 AM      Failed - This refill cannot be delegated      Failed - Valid encounter within last 6 months    Recent Outpatient Visits           7 months ago Sinusitis, unspecified chronicity, unspecified location   [facility] Birdie Sons, MD   11 months ago Rheumatoid arthritis, involving unspecified site, unspecified whether rheumatoid factor present [facility])   [facility] Birdie Sons, MD   11 months ago Breast lump on left side at 1 o'clock position   [facility] Flinchum, Kelby Aline, FNP   2 years ago Mild persistent asthma with exacerbation   [facility] Birdie Sons, MD   2 years ago [place], [place]       Future Appointments             In 3 months [place], Nicki Reaper, [place] [facility]

## 2020-10-01 ENCOUNTER — Other Ambulatory Visit (INDEPENDENT_AMBULATORY_CARE_PROVIDER_SITE_OTHER): Payer: Self-pay | Admitting: Pediatrics

## 2020-10-01 DIAGNOSIS — G2569 Other tics of organic origin: Secondary | ICD-10-CM

## 2020-10-03 ENCOUNTER — Other Ambulatory Visit: Payer: Self-pay | Admitting: Family Medicine

## 2020-10-03 NOTE — Telephone Encounter (Signed)
Requested medications are due for refill today yes  Requested medications are on the active medication list yes  Last refill 1/18  Last visit I do not see this dx/med addressed  Future visit scheduled no  Notes to clinic Please assess.

## 2020-10-07 ENCOUNTER — Other Ambulatory Visit: Payer: Self-pay | Admitting: Family Medicine

## 2020-10-12 ENCOUNTER — Telehealth: Payer: Self-pay | Admitting: Internal Medicine

## 2020-10-12 ENCOUNTER — Other Ambulatory Visit: Payer: Self-pay | Admitting: Family Medicine

## 2020-10-12 MED ORDER — AMPHETAMINE-DEXTROAMPHET ER 15 MG PO CP24: 15.0000 mg | ORAL_CAPSULE | ORAL | 0 refills | Status: DC

## 2020-10-12 NOTE — Telephone Encounter (Signed)
Adderall refilled

## 2020-10-12 NOTE — Telephone Encounter (Signed)
Called and spoke with the pt and notified her that her refill was sent. Nothing further needed.

## 2020-10-12 NOTE — Telephone Encounter (Signed)
Pt called back. °

## 2020-10-12 NOTE — Telephone Encounter (Signed)
   Notes to clinic:   Alternative Requested:INSURANCE REQUEST A PREFERRED PRODUCT. PANTOPRAZOLE,OMEPRAZOLE  Requested Prescriptions  Pending Prescriptions Disp Refills   omeprazole (PRILOSEC) 10 MG capsule [Pharmacy Med Name: OMEPRAZOLE DR 10 MG CAPSULE]  0      Gastroenterology: Proton Pump Inhibitors Passed - 10/12/2020  7:03 PM      Passed - Valid encounter within last 12 months    Recent Outpatient Visits           7 months ago Sinusitis, unspecified chronicity, unspecified location   [facility] Birdie Sons, MD   11 months ago Rheumatoid arthritis, involving unspecified site, unspecified whether rheumatoid factor present [facility])   [facility] Birdie Sons, MD   1 year ago Breast lump on left side at 1 o'clock position   [facility] Flinchum, Kelby Aline, FNP   2 years ago Mild persistent asthma with exacerbation   [facility] Birdie Sons, MD   2 years ago [place], [place]       Future Appointments             In 2 months [place], Nicki Reaper, [place] [facility]

## 2020-10-12 NOTE — Telephone Encounter (Signed)
ATC patient, LVM to return call regarding request for refill.  Dr. Annamaria Boots, Patient is requesting a refill of her Adderall, please advise.  Thank you.

## 2020-10-14 ENCOUNTER — Other Ambulatory Visit: Payer: Self-pay | Admitting: Family Medicine

## 2020-10-14 MED ORDER — FENTANYL 100 MCG/HR TD PT72
1.0000 | MEDICATED_PATCH | TRANSDERMAL | 0 refills | Status: DC
Start: 2020-10-14 — End: 2020-11-16

## 2020-10-14 NOTE — Telephone Encounter (Signed)
Requested medication (s) are due for refill today - yes  Requested medication (s) are on the active medication list -yes  Future visit scheduled -no  Last refill: 09/14/20  Notes to clinic: Request RF non delegated Rx  Requested Prescriptions  Pending Prescriptions Disp Refills   fentaNYL (DURAGESIC) 100 MCG/HR 10 patch 0    Sig: Place 1 patch onto the skin every 3 (three) days.      Not Delegated - Analgesics:  Opioid Agonists Failed - 10/14/2020 10:44 AM      Failed - This refill cannot be delegated      Failed - Urine Drug Screen completed in last 360 days      Failed - Valid encounter within last 6 months    Recent Outpatient Visits           7 months ago Sinusitis, unspecified chronicity, unspecified location   [facility] Birdie Sons, MD   11 months ago Rheumatoid arthritis, involving unspecified site, unspecified whether rheumatoid factor present [facility])   [facility] Birdie Sons, MD   1 year ago Breast lump on left side at 1 o'clock position   [facility] Flinchum, Kelby Aline, FNP   2 years ago Mild persistent asthma with exacerbation   [facility] Birdie Sons, MD   2 years ago [place], [place]       Future Appointments             In 2 months MacDiarmid, Nicki Reaper, MD [facility]                 Requested Prescriptions  Pending Prescriptions Disp Refills   fentaNYL (DURAGESIC) 100 MCG/HR 10 patch 0    Sig: Place 1 patch onto the skin every 3 (three) days.      Not Delegated - Analgesics:  Opioid Agonists Failed - 10/14/2020 10:44 AM      Failed - This refill cannot be delegated      Failed - Urine Drug Screen completed in last 360 days      Failed - Valid encounter within last 6 months    Recent Outpatient Visits           7 months ago Sinusitis, unspecified chronicity, unspecified location   [facility] Birdie Sons, MD   11 months ago Rheumatoid arthritis, involving unspecified site, unspecified whether rheumatoid factor present [facility])   [facility] Birdie Sons, MD   1 year ago Breast lump on left side at 1 o'clock position   [facility] Flinchum, Kelby Aline, FNP   2 years ago Mild persistent asthma with exacerbation   [facility] Birdie Sons, MD   2 years ago [place], [place]       Future Appointments             In 2 months [place], Nicki Reaper, [place] [facility]

## 2020-10-14 NOTE — Telephone Encounter (Signed)
Called and spoke with Patient. Patient stated she was told be pharmacy that a PA is needed for Adderall. [facility] with CMM key.   PA request [facility] received from [facility] [place] Fax: [phone] Medication name and strength: Amphetamine Dextroamphet ER 15mg  Ordering Provider: Dr. Annamaria Boots  Was PA started with [facility]?: yes If yes, please enter KEY: [facility]  [place] Key: [id number] - PA Case ID: [id number] - Rx #: [id number] Need help? Call us at [phone]  Status  Sent to Plan today  DrugAmphetamine-Dextroamphet ER 15MG  er capsules  Form[employer] Electronic PA Form (2017 NCPDP)  Original Claim Info75 QTY LIMIT EXCD PA RQD [phone] REQUIRES PRIOR AUTHORIZATION   PA started in triage

## 2020-10-14 NOTE — Telephone Encounter (Signed)
Medication Refill - Medication: Fentanyl Patches  Has the patient contacted their pharmacy? No. Pt states that she always has to call to office. She states that she is needing to have a new prescription. Please advise.  (Agent: If no, request that the patient contact the pharmacy for the refill.) (Agent: If yes, when and what did the pharmacy advise?)  Preferred Pharmacy (with phone number or street name):  [id number] [name] [place] - [place]  [place] [address]  Phone: [phone] Fax: [phone]  Hours: Not open 24 hours     Agent: Please be advised that RX refills may take up to 3 business days. We ask that you follow-up with your pharmacy.

## 2020-10-15 NOTE — Telephone Encounter (Signed)
   Call made to pharmacy, made aware PA as been approved. They report they were notified and the patient has picked it up. Voiced understanding.   Nothing further needed at this time.

## 2020-10-21 NOTE — Assessment & Plan Note (Addendum)
Needs to improve compliance as discussed. Main problem is limit setting and consistent bedtime. Most residual events listed as hypopneas on current download. Plan- Aim for regular bedtime 11:30-12MN. Then we may need to consider different approach- possibly ASV

## 2020-10-21 NOTE — Assessment & Plan Note (Signed)
Complicated medical issues Plan- continue working with Neurology

## 2020-11-05 ENCOUNTER — Other Ambulatory Visit: Payer: Self-pay | Admitting: Family Medicine

## 2020-11-09 ENCOUNTER — Ambulatory Visit: Payer: Medicare Other | Admitting: Family Medicine

## 2020-11-11 ENCOUNTER — Telehealth: Payer: Self-pay | Admitting: Internal Medicine

## 2020-11-11 MED ORDER — AMPHETAMINE-DEXTROAMPHET ER 15 MG PO CP24: 15.0000 mg | ORAL_CAPSULE | ORAL | 0 refills | Status: DC

## 2020-11-11 NOTE — Telephone Encounter (Signed)
Adderall refilled

## 2020-11-11 NOTE — Telephone Encounter (Signed)
Received faxed refill request from pt called for refill  Medication name/strength/dose: Adderall XR 15 mg Medication last rx'd: 10/12/2020 Quantity and number of refills last rx'd: 31 with no refills Instructions: take one capsule by mouth every morning  Last OV: 09/07/2020 Next OV: no pending OV  CY  please advise on refill request  Allergies  Allergen Reactions  . Chocolate Flavor     Other reaction(s): Other (See Comments) Wheezing, acne  . Demerol [Meperidine] Other (See Comments)    Slow to wake up when this drug is given.   Marland Kitchen Keflex [Cephalexin] Nausea And Vomiting  . Morphine And Related Nausea And Vomiting  . Toradol [Ketorolac Tromethamine] Nausea And Vomiting  . Augmentin  [Amoxicillin-Pot Clavulanate]   . Chocolate     GI distress  . Nsaids     patient develops ulcers  . Strawberry Extract     GI distress  . Tape Dermatitis   Current Outpatient Medications on File Prior to Visit  Medication Sig Dispense Refill  . albuterol (VENTOLIN HFA) 108 (90 Base) MCG/ACT inhaler Inhale 2 puffs into the lungs every 6 (six) hours as needed. 8 g 12  . amphetamine-dextroamphetamine (ADDERALL XR) 15 MG 24 hr capsule Take 1 capsule by mouth every morning. 31 capsule 0  . azelastine (OPTIVAR) 0.05 % ophthalmic solution Apply to eye as directed. 1 drop in each eye twice daily    . busPIRone (BUSPAR) 10 MG tablet TAKE 1 TABLET BY MOUTH TWICE A DAY 180 tablet 2  . celecoxib (CELEBREX) 200 MG capsule TAKE 1 CAPSULE BY MOUTH TWICE A DAY 60 capsule 5  . diclofenac Sodium (VOLTAREN) 1 % GEL APPLY AS NEEDED 3 TIMES A DAY 100 g 0  . DULoxetine (CYMBALTA) 60 MG capsule TAKE 2 CAPSULES BY MOUTH EVERY DAY 180 capsule 1  . fentaNYL (DURAGESIC) 100 MCG/HR Place 1 patch onto the skin every 3 (three) days. 10 patch 0  . fexofenadine (ALLEGRA) 180 MG tablet Take 180 mg by mouth daily.    . fluticasone (FLONASE) 50 MCG/ACT nasal spray SPRAY 2 SPRAYS INTO EACH NOSTRIL EVERY DAY 16 g 2  .  Fluticasone-Salmeterol (ADVAIR) 250-50 MCG/DOSE AEPB Inhale 1 puff into the lungs every 12 (twelve) hours. Rinse mouth after use 60 each 11  . gabapentin (NEURONTIN) 600 MG tablet Take 2 tablets in the morning and 3 tablets at bedtime 155 tablet 5  . haloperidol (HALDOL) 0.5 MG tablet TAKE 1/2 A TAB BY MOUTH TWICE A DAY 31 tablet 2  . loratadine (CLARITIN) 10 MG tablet Take 10 mg by mouth daily.    Marland Kitchen omeprazole (PRILOSEC) 40 MG capsule Take 1 capsule (40 mg total) by mouth daily. 30 capsule 1  . ondansetron (ZOFRAN-ODT) 4 MG disintegrating tablet DISSOLVE 1 TABLET IN MOUTH EVERY 8 HOURS FOR NAUSEA AND VOMITING 30 tablet 5  . pregabalin (LYRICA) 300 MG capsule Take 1 capsule (300 mg total) by mouth 2 (two) times daily. 180 capsule 5  . QUEtiapine (SEROQUEL) 200 MG tablet TAKE 1 TABLET (200 MG TOTAL) BY MOUTH AT BEDTIME. 90 tablet 4  . tamsulosin (FLOMAX) 0.4 MG CAPS capsule Take 1 capsule (0.4 mg total) by mouth daily. 90 capsule 3  . tiZANidine (ZANAFLEX) 4 MG tablet Take 0.5-1 tablets (2-4 mg total) by mouth every 8 (eight) hours as needed for muscle spasms. 90 tablet 4   No current facility-administered medications on file prior to visit.

## 2020-11-12 NOTE — Telephone Encounter (Signed)
Left message for patient to call back  

## 2020-11-13 NOTE — Telephone Encounter (Signed)
Called and spoke with patient. She is aware that CY has called in the Adderall refill for her. She verbalized understanding.   Nothing further needed at time of call.

## 2020-11-16 ENCOUNTER — Other Ambulatory Visit: Payer: Self-pay | Admitting: Family Medicine

## 2020-11-16 MED ORDER — DICLOFENAC SODIUM 1 % EX GEL: CUTANEOUS | 0 refills | Status: DC

## 2020-11-16 MED ORDER — FENTANYL 100 MCG/HR TD PT72: 1.0000 | MEDICATED_PATCH | TRANSDERMAL | 0 refills | Status: DC

## 2020-11-16 NOTE — Telephone Encounter (Signed)
Requested medication (s) are due for refill today: yes  Requested medication (s) are on the active medication list: yes  Last refill:  10/14/20  Future visit scheduled: yes  Notes to clinic:  not delegated   Requested Prescriptions  Pending Prescriptions Disp Refills   fentaNYL (DURAGESIC) 100 MCG/HR 10 patch 0    Sig: Place 1 patch onto the skin every 3 (three) days.      Not Delegated - Analgesics:  Opioid Agonists Failed - 11/16/2020 10:22 AM      Failed - This refill cannot be delegated      Failed - Urine Drug Screen completed in last 360 days      Failed - Valid encounter within last 6 months    Recent Outpatient Visits           8 months ago Sinusitis, unspecified chronicity, unspecified location   [facility] Birdie Sons, MD   1 year ago Rheumatoid arthritis, involving unspecified site, unspecified whether rheumatoid factor present [facility])   [facility] Birdie Sons, MD   1 year ago Breast lump on left side at 1 o'clock position   [facility] Flinchum, Kelby Aline, FNP   2 years ago Mild persistent asthma with exacerbation   [facility] Birdie Sons, MD   2 years ago [place], Herbie [place], [place]       Future Appointments             In 3 weeks Caryn Section, Kirstie Peri, MD [facility]   In 1 month MacDiarmid, [place], MD [facility]              Signed Prescriptions Disp Refills   diclofenac Sodium (VOLTAREN) 1 % GEL 100 g 0    Sig: APPLY AS NEEDED 3 TIMES A DAY      Analgesics:  Topicals Passed - 11/16/2020 10:22 AM      Passed - Valid encounter within last 12 months    Recent Outpatient Visits           8 months ago Sinusitis, unspecified chronicity, unspecified location   [facility] Birdie Sons, MD   1 year ago Rheumatoid arthritis, involving unspecified site, unspecified whether rheumatoid factor  present [facility])   [facility] Birdie Sons, MD   1 year ago Breast lump on left side at 1 o'clock position   [facility] Flinchum, Kelby Aline, FNP   2 years ago Mild persistent asthma with exacerbation   [facility] Birdie Sons, MD   2 years ago [place], [place]       Future Appointments             In 3 weeks Fisher, Kirstie Peri, MD [facility], [place]   In 1 month [place], Nicki Reaper, [place]

## 2020-11-16 NOTE — Telephone Encounter (Signed)
Requested Prescriptions  Pending Prescriptions Disp Refills  . diclofenac Sodium (VOLTAREN) 1 % GEL 100 g 0    Sig: APPLY AS NEEDED 3 TIMES A DAY     Analgesics:  Topicals Passed - 11/16/2020 10:22 AM      Passed - Valid encounter within last 12 months    Recent Outpatient Visits          8 months ago Sinusitis, unspecified chronicity, unspecified location   [facility] Birdie Sons, MD   1 year ago Rheumatoid arthritis, involving unspecified site, unspecified whether rheumatoid factor present [facility])   [facility] Birdie Sons, MD   1 year ago Breast lump on left side at 1 o'clock position   [facility] Flinchum, Kelby Aline, FNP   2 years ago Mild persistent asthma with exacerbation   [facility] Birdie Sons, MD   2 years ago [place], Herbie [place], [place]      Future Appointments            In 3 weeks Fisher, Kirstie Peri, MD [facility], [place]   In 1 month MacDiarmid, Nicki Reaper, MD [place]           . fentaNYL (DURAGESIC) 100 MCG/HR 10 patch 0    Sig: Place 1 patch onto the skin every 3 (three) days.     Not Delegated - Analgesics:  Opioid Agonists Failed - 11/16/2020 10:22 AM      Failed - This refill cannot be delegated      Failed - Urine Drug Screen completed in last 360 days      Failed - Valid encounter within last 6 months    Recent Outpatient Visits          8 months ago Sinusitis, unspecified chronicity, unspecified location   [facility] Birdie Sons, MD   1 year ago Rheumatoid arthritis, involving unspecified site, unspecified whether rheumatoid factor present [facility])   [facility] Birdie Sons, MD   1 year ago Breast lump on left side at 1 o'clock position   [facility] Flinchum, Kelby Aline, FNP   2 years ago Mild persistent asthma with exacerbation   [facility]  Birdie [facility], [facility]   2 years ago [place], [place]      Future Appointments            In 3 weeks Fisher, Kirstie Peri, MD [facility], [place]   In 1 month [place], Nicki Reaper, [place] [facility]

## 2020-11-16 NOTE — Telephone Encounter (Signed)
Medication Refill - Medication: fentaNYL (DURAGESIC) 100 MCG/HR diclofenac Sodium (VOLTAREN) 1 % GEL     Preferred Pharmacy (with phone number or street name):  [id number] [name], [place] Phone:  [phone]  Fax:  [phone]       Agent: Please be advised that RX refills may take up to 3 business days. We ask that you follow-up with your pharmacy.

## 2020-12-04 ENCOUNTER — Ambulatory Visit (INDEPENDENT_AMBULATORY_CARE_PROVIDER_SITE_OTHER): Payer: Medicare Other | Admitting: Pediatrics

## 2020-12-06 ENCOUNTER — Other Ambulatory Visit: Payer: Self-pay | Admitting: Family Medicine

## 2020-12-06 NOTE — Telephone Encounter (Signed)
Requested Prescriptions  Pending Prescriptions Disp Refills  . omeprazole (PRILOSEC) 40 MG capsule [Pharmacy Med Name: OMEPRAZOLE DR 40 MG CAPSULE] 30 capsule 1    Sig: TAKE 1 CAPSULE (40 MG TOTAL) BY MOUTH DAILY.     Gastroenterology: Proton Pump Inhibitors Passed - 12/06/2020 10:30 AM      Passed - Valid encounter within last 12 months    Recent Outpatient Visits          9 months ago Sinusitis, unspecified chronicity, unspecified location   [facility] Birdie Sons, MD   1 year ago Rheumatoid arthritis, involving unspecified site, unspecified whether rheumatoid factor present [facility])   [facility] Birdie Sons, MD   1 year ago Breast lump on left side at 1 o'clock position   [facility] Flinchum, Kelby Aline, FNP   2 years ago Mild persistent asthma with exacerbation   [facility] Birdie Sons, MD   2 years ago [place], Herbie [place], [place]      Future Appointments            Tomorrow Caryn Section, Kirstie Peri, MD [facility], [place]   In 4 weeks Bjorn Loser, [place] [facility]

## 2020-12-07 ENCOUNTER — Encounter: Payer: Self-pay | Admitting: Family Medicine

## 2020-12-07 ENCOUNTER — Other Ambulatory Visit: Payer: Self-pay

## 2020-12-07 ENCOUNTER — Ambulatory Visit (INDEPENDENT_AMBULATORY_CARE_PROVIDER_SITE_OTHER): Payer: Medicare Other | Admitting: Family Medicine

## 2020-12-07 VITALS — BP 115/79 | HR 73 | Temp 97.9°F | Resp 16 | Wt 140.0 lb

## 2020-12-07 DIAGNOSIS — G44221 Chronic tension-type headache, intractable: Secondary | ICD-10-CM | POA: Diagnosis not present

## 2020-12-07 DIAGNOSIS — M069 Rheumatoid arthritis, unspecified: Secondary | ICD-10-CM | POA: Diagnosis not present

## 2020-12-07 DIAGNOSIS — R569 Unspecified convulsions: Secondary | ICD-10-CM

## 2020-12-07 DIAGNOSIS — M5136 Other intervertebral disc degeneration, lumbar region: Secondary | ICD-10-CM | POA: Diagnosis not present

## 2020-12-07 DIAGNOSIS — Q796 Ehlers-Danlos syndrome, unspecified: Secondary | ICD-10-CM | POA: Diagnosis not present

## 2020-12-07 DIAGNOSIS — F32A Depression, unspecified: Secondary | ICD-10-CM

## 2020-12-07 DIAGNOSIS — G4700 Insomnia, unspecified: Secondary | ICD-10-CM | POA: Diagnosis not present

## 2020-12-07 MED ORDER — QUETIAPINE FUMARATE 200 MG PO TABS
200.0000 mg | ORAL_TABLET | Freq: Every day | ORAL | 12 refills | Status: DC
Start: 2020-12-07 — End: 2020-12-30

## 2020-12-07 MED ORDER — BUSPIRONE HCL 15 MG PO TABS: 15.0000 mg | ORAL_TABLET | Freq: Two times a day (BID) | ORAL | 5 refills | Status: DC

## 2020-12-07 NOTE — Progress Notes (Signed)
Established patient visit   Patient: Karen [name]   DOB: Nov 09, 1975   45 y.o. Female  MRN: [id number] Visit Date: 12/07/2020  Today's healthcare provider: Lelon Huh, MD   Chief Complaint  Patient presents with  . Follow-up   Subjective    HPI  Patient here today to follow up on Ehlers-Danlos syndrome. Patient reports she is needing a new wheelchair due to missing parts. Patient reports she has had current wheelchair for over five years.   Patient Active Problem List   Diagnosis Date Noted  . Chronic tension type headache 06/04/2020  . Breast lump on left side at 1 o'clock position 10/09/2019  . Fibrocystic breast changes, bilateral 10/09/2019  . Frequent urination 10/09/2019  . Family history of colon cancer   . Benign neoplasm of transverse colon   . Polyp of sigmoid colon   . Benign neoplasm of ascending colon   . Rectal polyp   . Heartburn   . Abdominal pain, epigastric   . Excessive somnolence disorder 06/28/2017  . Seizures ([place]) 09/12/2016  . Sinusitis 09/12/2016  . Difficulty hearing 08/31/2015  . Back pain, chronic 08/31/2015  . Rheumatoid arthritis ([place]) 08/31/2015  . Abdominal pain 03/10/2015  . Alopecia 03/10/2015  . Anemia 03/10/2015  . Arthritis 03/10/2015  . Allergic asthma, mild persistent, uncomplicated 63/14/9702  . Chronic nausea 03/10/2015  . DDD (degenerative disc disease), lumbar 03/10/2015  . Depression 03/10/2015  . Personal history of MRSA (methicillin resistant Staphylococcus aureus) 03/10/2015  . Insomnia 03/10/2015  . Mitral valve prolapse 03/10/2015  . Paresthesias/numbness 03/10/2015  . Allergic rhinitis, seasonal 03/10/2015  . Seizure disorder ([place]) 03/10/2015  . Mixed sleep apnea, Central predominant 03/10/2015  . Migraine without aura 01/27/2014  . Fibromyalgia 01/27/2014  . Transient alteration of awareness 01/07/2014  . Tics of organic origin 01/07/2014  . Backache, unspecified 01/07/2014  . Ehlers-Danlos  syndrome 01/07/2014   Social History   Tobacco Use  . Smoking status: Never Smoker  . Smokeless tobacco: Never Used  Vaping Use  . Vaping Use: Never used  Substance Use Topics  . Alcohol use: No    Alcohol/week: 0.0 standard drinks  . Drug use: No   Allergies  Allergen Reactions  . Chocolate Flavor     Other reaction(s): Other (See Comments) Wheezing, acne  . Demerol [Meperidine] Other (See Comments)    Slow to wake up when this drug is given.   Marland Kitchen Keflex [Cephalexin] Nausea And Vomiting  . Morphine And Related Nausea And Vomiting  . Toradol [Ketorolac Tromethamine] Nausea And Vomiting  . Augmentin  [Amoxicillin-Pot Clavulanate]   . Chocolate     GI distress  . Nsaids     patient develops ulcers  . Strawberry Extract     GI distress  . Tape Dermatitis       Medications: Outpatient Medications Prior to Visit  Medication Sig  . albuterol (VENTOLIN HFA) 108 (90 Base) MCG/ACT inhaler Inhale 2 puffs into the lungs every 6 (six) hours as needed.  Marland Kitchen amphetamine-dextroamphetamine (ADDERALL XR) 15 MG 24 hr capsule Take 1 capsule by mouth every morning.  Marland Kitchen azelastine (OPTIVAR) 0.05 % ophthalmic solution Apply to eye as directed. 1 drop in each eye twice daily  . busPIRone (BUSPAR) 10 MG tablet TAKE 1 TABLET BY MOUTH TWICE A DAY  . celecoxib (CELEBREX) 200 MG capsule TAKE 1 CAPSULE BY MOUTH TWICE A DAY  . diclofenac Sodium (VOLTAREN) 1 % GEL APPLY AS NEEDED 3 TIMES A DAY  .  DULoxetine (CYMBALTA) 60 MG capsule TAKE 2 CAPSULES BY MOUTH EVERY DAY  . fentaNYL (DURAGESIC) 100 MCG/HR Place 1 patch onto the skin every 3 (three) days.  . fexofenadine (ALLEGRA) 180 MG tablet Take 180 mg by mouth daily.  . fluticasone (FLONASE) 50 MCG/ACT nasal spray SPRAY 2 SPRAYS INTO EACH NOSTRIL EVERY DAY  . Fluticasone-Salmeterol (ADVAIR) 250-50 MCG/DOSE AEPB Inhale 1 puff into the lungs every 12 (twelve) hours. Rinse mouth after use  . gabapentin (NEURONTIN) 600 MG tablet Take 2 tablets in the  morning and 3 tablets at bedtime  . haloperidol (HALDOL) 0.5 MG tablet TAKE 1/2 A TAB BY MOUTH TWICE A DAY  . loratadine (CLARITIN) 10 MG tablet Take 10 mg by mouth daily.  Marland Kitchen omeprazole (PRILOSEC) 40 MG capsule TAKE 1 CAPSULE (40 MG TOTAL) BY MOUTH DAILY.  Marland Kitchen ondansetron (ZOFRAN-ODT) 4 MG disintegrating tablet DISSOLVE 1 TABLET IN MOUTH EVERY 8 HOURS FOR NAUSEA AND VOMITING  . pregabalin (LYRICA) 300 MG capsule Take 1 capsule (300 mg total) by mouth 2 (two) times daily.  . QUEtiapine (SEROQUEL) 200 MG tablet TAKE 1 TABLET (200 MG TOTAL) BY MOUTH AT BEDTIME.  . tamsulosin (FLOMAX) 0.4 MG CAPS capsule Take 1 capsule (0.4 mg total) by mouth daily.  Marland Kitchen tiZANidine (ZANAFLEX) 4 MG tablet Take 0.5-1 tablets (2-4 mg total) by mouth every 8 (eight) hours as needed for muscle spasms.   No facility-administered medications prior to visit.    Review of Systems  Constitutional: Negative for activity change, appetite change and fever.  Respiratory: Negative for cough.   Cardiovascular: Negative for chest pain and palpitations.        Objective    BP 115/79 (BP Location: Left Arm, Patient Position: Sitting, Cuff Size: Normal)   Pulse 73   Temp 97.9 F (36.6 C) (Temporal)   Resp 16   Wt 140 lb (63.5 kg)   SpO2 97%   BMI 27.80 kg/m  BP Readings from Last 3 Encounters:  12/07/20 115/79  09/07/20 120/84  06/04/20 132/90   Wt Readings from Last 3 Encounters:  12/07/20 140 lb (63.5 kg)  09/07/20 140 lb (63.5 kg)  06/04/20 137 lb (62.1 kg)       Physical Exam   General: Appearance:     Overweight female in no acute distress. Confined to wheelchair.   Eyes:    PERRL, conjunctiva/corneas clear, EOM's intact       Lungs:     Clear to auscultation bilaterally, respirations unlabored  Heart:    Normal heart rate. Normal rhythm. No murmurs, rubs, or gallops.   MS:   All extremities are intact. Increase joint laxity throughout  Neurologic:   Awake, alert, oriented x 3. Diminished UE and LE  strength. Normal reflexes. Unable to stand without assistance.        Assessment & Plan     1. Ehlers-Danlos syndrome   2. Depression, unspecified depression type   3. Insomnia, unspecified type   4. Chronic tension-type headache, intractable   5. Rheumatoid arthritis, involving unspecified site, unspecified whether rheumatoid factor present ([place])   6. DDD (degenerative disc disease), lumbar   7. Seizures ([place])  She is confined to wheelchair, unable to transfer without assistance.  Limited strength precludes manual wheelchair. Requires reclining back and hand operated controls.      The entirety of the information documented in the History of Present Illness, Review of Systems and Physical Exam were personally obtained by me. Portions of this information were initially documented  by the [facility] and reviewed by me for thoroughness and accuracy.      Lelon Huh, MD  [facility] [phone] (phone) [phone] (fax)  [place]

## 2020-12-11 ENCOUNTER — Other Ambulatory Visit (INDEPENDENT_AMBULATORY_CARE_PROVIDER_SITE_OTHER): Payer: Self-pay | Admitting: Pediatrics

## 2020-12-11 DIAGNOSIS — Q796 Ehlers-Danlos syndrome, unspecified: Secondary | ICD-10-CM

## 2020-12-11 DIAGNOSIS — M797 Fibromyalgia: Secondary | ICD-10-CM

## 2020-12-15 ENCOUNTER — Telehealth: Payer: Self-pay

## 2020-12-15 ENCOUNTER — Telehealth: Payer: Self-pay | Admitting: Internal Medicine

## 2020-12-15 MED ORDER — AMPHETAMINE-DEXTROAMPHET ER 15 MG PO CP24: 15.0000 mg | ORAL_CAPSULE | ORAL | 0 refills | Status: DC

## 2020-12-15 NOTE — Telephone Encounter (Signed)
Copied from [place] [id number]. Topic: Quick Communication - Rx Refill/Question >> Dec 15, 2020  4:29 PM Loma [place] wrote: MedicationfentaNYL (DURAGESIC) 100 MCG/HR 10 patch 0 11/16/2020   Sig - Route: Place 1 patch onto the skin every 3 (three) days. - Transdermal  Sent to pharmacy as: fentaNYL ([place]) 100 MCG/HR  Earliest Fill Date: 11/16/2020     Has the patient contacted their pharmacy     no (Agent: If no, request that the patient contact the pharmacy for the refill  pt always is told to call dr Caryn Section   Preferred Pharmacy (with phone number or street name):[id number] [name], [place]  Phone:  [phone]       Agent: Please be advised that RX refills may take up to 3 business days. We ask that you follow-up with your pharmacy.

## 2020-12-15 NOTE — Telephone Encounter (Signed)
Adderall refilled

## 2020-12-15 NOTE — Telephone Encounter (Signed)
Received faxed refill request from --pt called  Medication name/strength/dose: Adderall xr 15 mg Medication last rx'd: 11/11/2020 Quantity and number of refills last rx'd: #31 with no refills Instructions: take one capsule by mouth every morning  Last OV: 09/07/2020 Next OV: no pending apps  CY  please advise on refill request  Allergies  Allergen Reactions  . Chocolate Flavor     Other reaction(s): Other (See Comments) Wheezing, acne  . Demerol [Meperidine] Other (See Comments)    Slow to wake up when this drug is given.   Marland Kitchen Keflex [Cephalexin] Nausea And Vomiting  . Morphine And Related Nausea And Vomiting  . Toradol [Ketorolac Tromethamine] Nausea And Vomiting  . Augmentin  [Amoxicillin-Pot Clavulanate]   . Chocolate     GI distress  . Nsaids     patient develops ulcers  . Strawberry Extract     GI distress  . Tape Dermatitis   Current Outpatient Medications on File Prior to Visit  Medication Sig Dispense Refill  . albuterol (VENTOLIN HFA) 108 (90 Base) MCG/ACT inhaler Inhale 2 puffs into the lungs every 6 (six) hours as needed. 8 g 12  . amphetamine-dextroamphetamine (ADDERALL XR) 15 MG 24 hr capsule Take 1 capsule by mouth every morning. 31 capsule 0  . azelastine (OPTIVAR) 0.05 % ophthalmic solution Apply to eye as directed. 1 drop in each eye twice daily    . busPIRone (BUSPAR) 15 MG tablet Take 1 tablet (15 mg total) by mouth 2 (two) times daily. 60 tablet 5  . celecoxib (CELEBREX) 200 MG capsule TAKE 1 CAPSULE BY MOUTH TWICE A DAY 60 capsule 5  . diclofenac Sodium (VOLTAREN) 1 % GEL APPLY AS NEEDED 3 TIMES A DAY 100 g 0  . DULoxetine (CYMBALTA) 60 MG capsule TAKE 2 CAPSULES BY MOUTH EVERY DAY 180 capsule 1  . fentaNYL (DURAGESIC) 100 MCG/HR Place 1 patch onto the skin every 3 (three) days. 10 patch 0  . fexofenadine (ALLEGRA) 180 MG tablet Take 180 mg by mouth daily.    . fluticasone (FLONASE) 50 MCG/ACT nasal spray SPRAY 2 SPRAYS INTO EACH NOSTRIL EVERY DAY 16 g 2   . Fluticasone-Salmeterol (ADVAIR) 250-50 MCG/DOSE AEPB Inhale 1 puff into the lungs every 12 (twelve) hours. Rinse mouth after use 60 each 11  . gabapentin (NEURONTIN) 600 MG tablet TAKE 2 TABLETS IN THE MORNING AND 3 TABLETS AT BEDTIME 155 tablet 0  . haloperidol (HALDOL) 0.5 MG tablet TAKE 1/2 A TAB BY MOUTH TWICE A DAY 31 tablet 2  . loratadine (CLARITIN) 10 MG tablet Take 10 mg by mouth daily.    Marland Kitchen omeprazole (PRILOSEC) 40 MG capsule TAKE 1 CAPSULE (40 MG TOTAL) BY MOUTH DAILY. 30 capsule 1  . ondansetron (ZOFRAN-ODT) 4 MG disintegrating tablet DISSOLVE 1 TABLET IN MOUTH EVERY 8 HOURS FOR NAUSEA AND VOMITING 30 tablet 5  . pregabalin (LYRICA) 300 MG capsule Take 1 capsule (300 mg total) by mouth 2 (two) times daily. 180 capsule 5  . QUEtiapine (SEROQUEL) 200 MG tablet Take 1 tablet (200 mg total) by mouth at bedtime. 30 tablet 12  . tamsulosin (FLOMAX) 0.4 MG CAPS capsule Take 1 capsule (0.4 mg total) by mouth daily. 90 capsule 3  . tiZANidine (ZANAFLEX) 4 MG tablet Take 0.5-1 tablets (2-4 mg total) by mouth every 8 (eight) hours as needed for muscle spasms. 90 tablet 4   No current facility-administered medications on file prior to visit.

## 2020-12-15 NOTE — Telephone Encounter (Signed)
I have called the pt and she is aware of rx sent to the pharmacy.  

## 2020-12-16 ENCOUNTER — Ambulatory Visit (INDEPENDENT_AMBULATORY_CARE_PROVIDER_SITE_OTHER): Payer: Medicare Other | Admitting: Pediatrics

## 2020-12-16 ENCOUNTER — Telehealth: Payer: Self-pay | Admitting: Family Medicine

## 2020-12-16 MED ORDER — FENTANYL 100 MCG/HR TD PT72: 1.0000 | MEDICATED_PATCH | TRANSDERMAL | 0 refills | Status: DC

## 2020-12-16 NOTE — Telephone Encounter (Unsigned)
Copied from [phone]. Topic: Quick Communication - Rx Refill/Question >> Dec 16, 2020  1:22 PM Mcneil, Ja-Kwan wrote: Medication: fentaNYL ([place]) 100 MCG/HR  Has the patient contacted their pharmacy? no  Preferred Pharmacy (with phone number or street name): [id number] [name], [place]  Phone: [phone]   Fax: [phone]  Agent: Please be advised that RX refills may take up to 3 business days. We ask that you follow-up with your pharmacy.

## 2020-12-16 NOTE — Telephone Encounter (Signed)
Patient notified that script was sent to pharmacy this morning and to contact them

## 2020-12-20 ENCOUNTER — Encounter (INDEPENDENT_AMBULATORY_CARE_PROVIDER_SITE_OTHER): Payer: Self-pay

## 2020-12-29 ENCOUNTER — Other Ambulatory Visit: Payer: Self-pay | Admitting: Family Medicine

## 2020-12-29 NOTE — Telephone Encounter (Signed)
Requested medications are due for refill today yes  Requested medications are on the active medication list yes  Last refill 2/15  Last visit 4/25  Future visit scheduled 12/30/20 (today)  Notes to clinic Not Delegated

## 2020-12-30 ENCOUNTER — Other Ambulatory Visit: Payer: Self-pay | Admitting: Family Medicine

## 2020-12-30 ENCOUNTER — Ambulatory Visit: Payer: Medicare Other

## 2020-12-30 DIAGNOSIS — F32A Depression, unspecified: Secondary | ICD-10-CM

## 2020-12-30 DIAGNOSIS — G4700 Insomnia, unspecified: Secondary | ICD-10-CM

## 2021-01-04 ENCOUNTER — Telehealth (INDEPENDENT_AMBULATORY_CARE_PROVIDER_SITE_OTHER): Payer: Self-pay | Admitting: Pediatrics

## 2021-01-04 ENCOUNTER — Telehealth: Payer: Self-pay

## 2021-01-04 ENCOUNTER — Ambulatory Visit: Payer: Medicare Other | Admitting: Urology

## 2021-01-04 DIAGNOSIS — G2569 Other tics of organic origin: Secondary | ICD-10-CM

## 2021-01-04 MED ORDER — HALOPERIDOL 0.5 MG PO TABS: ORAL_TABLET | ORAL | 0 refills | Status: DC

## 2021-01-04 NOTE — Telephone Encounter (Signed)
  Who's calling (name and relationship to patient) :[name] (Self)   Best contact number: [phone] Jerilynn Mages)  Provider they see: Dr Gaynell Face   Reason for call: Need to speak with provider for prescription refill      PRESCRIPTION REFILL ONLY  Name of prescription:  Pharmacy:

## 2021-01-04 NOTE — Telephone Encounter (Signed)
Copied from [place] [phone]. Topic: General - Other >> Jan 04, 2021  3:27 PM Leward Quan A wrote: Reason for CRM: Patient mother [name] called in to inquire of Dr B about getting booster # 4 asking if she can get it here at the clinic. Ph# [phone]

## 2021-01-04 NOTE — Telephone Encounter (Signed)
Please call patient to schedule an appointment for further refills.

## 2021-01-05 NOTE — Telephone Encounter (Signed)
Tried calling patient and her mother to reschedule COVID booster vaccine appointment. Left message to call back. Please schedule appointment on COVID vaccine clinic when patient or her mom returns call.

## 2021-01-06 ENCOUNTER — Ambulatory Visit: Payer: Medicare Other

## 2021-01-07 ENCOUNTER — Other Ambulatory Visit (INDEPENDENT_AMBULATORY_CARE_PROVIDER_SITE_OTHER): Payer: Self-pay | Admitting: Pediatrics

## 2021-01-07 DIAGNOSIS — G2569 Other tics of organic origin: Secondary | ICD-10-CM

## 2021-01-08 ENCOUNTER — Encounter: Payer: Self-pay | Admitting: Urology

## 2021-01-15 ENCOUNTER — Other Ambulatory Visit: Payer: Self-pay

## 2021-01-15 ENCOUNTER — Encounter (INDEPENDENT_AMBULATORY_CARE_PROVIDER_SITE_OTHER): Payer: Self-pay | Admitting: Pediatrics

## 2021-01-15 ENCOUNTER — Ambulatory Visit (INDEPENDENT_AMBULATORY_CARE_PROVIDER_SITE_OTHER): Payer: Medicare Other | Admitting: Pediatrics

## 2021-01-15 ENCOUNTER — Other Ambulatory Visit: Payer: Self-pay | Admitting: Family Medicine

## 2021-01-15 VITALS — BP 120/60 | HR 72 | Wt 140.0 lb

## 2021-01-15 DIAGNOSIS — Q796 Ehlers-Danlos syndrome, unspecified: Secondary | ICD-10-CM | POA: Diagnosis not present

## 2021-01-15 DIAGNOSIS — M797 Fibromyalgia: Secondary | ICD-10-CM

## 2021-01-15 DIAGNOSIS — G44221 Chronic tension-type headache, intractable: Secondary | ICD-10-CM

## 2021-01-15 DIAGNOSIS — G2569 Other tics of organic origin: Secondary | ICD-10-CM

## 2021-01-15 MED ORDER — GABAPENTIN 600 MG PO TABS: ORAL_TABLET | ORAL | 5 refills | Status: DC

## 2021-01-15 MED ORDER — HALOPERIDOL 0.5 MG PO TABS: ORAL_TABLET | ORAL | 5 refills | Status: DC

## 2021-01-15 MED ORDER — FENTANYL 100 MCG/HR TD PT72: 1.0000 | MEDICATED_PATCH | TRANSDERMAL | 0 refills | Status: DC

## 2021-01-15 NOTE — Telephone Encounter (Signed)
Requested medication (s) are due for refill today: yes  Requested medication (s) are on the active medication list: yes  Last refill:  12/16/20 #100 0 refills  Future visit scheduled: no  Notes to clinic:  not delegated per protocol, patient needs to change patch this Sunday      Requested Prescriptions  Pending Prescriptions Disp Refills   fentaNYL (DURAGESIC) 100 MCG/HR 10 patch 0    Sig: Place 1 patch onto the skin every 3 (three) days.      Not Delegated - Analgesics:  Opioid Agonists Failed - 01/15/2021 10:32 AM      Failed - This refill cannot be delegated      Failed - Urine Drug Screen completed in last 360 days      Passed - Valid encounter within last 6 months    Recent Outpatient Visits           1 month ago Ehlers-Danlos syndrome   [facility] Birdie Sons, MD   10 months ago Sinusitis, unspecified chronicity, unspecified location   [facility] Birdie Sons, MD   1 year ago Rheumatoid arthritis, involving unspecified site, unspecified whether rheumatoid factor present [facility])   [facility] Birdie Sons, MD   1 year ago Breast lump on left side at 1 o'clock position   [facility] Flinchum, Kelby Aline, FNP   2 years ago Mild persistent asthma with exacerbation   [facility] Birdie Sons, MD
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
## 2021-01-15 NOTE — Patient Instructions (Signed)
It was a pleasure to see you today.  We will request a transfer to Dr. Metta Clines specifically to help you with your motor tics.  I have enjoyed working with you over the years.  I will be available to help on till May 14, 2021, the day I retire.  I hope that we will be able to make a transition of care before that time.  I refilled her prescriptions today for gabapentin and haloperidol.

## 2021-01-15 NOTE — Telephone Encounter (Signed)
Medication Refill - Medication: fentaNYL (DURAGESIC) 100 MCG/HR Pt states she needs to put patch on this Sunday so she will need this refilled today / please advise   Has the patient contacted their pharmacy? No. (Agent: If no, request that the patient contact the pharmacy for the refill.) (Agent: If yes, when and what did the pharmacy advise?)  Preferred Pharmacy (with phone number or street name): [id number] [name], [place] - [address]  [place], [place] [address]  Phone:  [phone] Fax:  [phone]   Agent: Please be advised that RX refills may take up to 3 business days. We ask that you follow-up with your pharmacy.

## 2021-01-18 ENCOUNTER — Telehealth: Payer: Self-pay | Admitting: Internal Medicine

## 2021-01-18 MED ORDER — AMPHETAMINE-DEXTROAMPHET ER 15 MG PO CP24: 15.0000 mg | ORAL_CAPSULE | ORAL | 0 refills | Status: DC

## 2021-01-18 NOTE — Telephone Encounter (Signed)
Called and updated patient that Adderall was sent in to Pharmacy per Dr Annamaria Boots. Patient expressed full understanding. Nothing further needed at this time.

## 2021-01-18 NOTE — Telephone Encounter (Signed)
[facility]

## 2021-01-18 NOTE — Telephone Encounter (Signed)
Called and spoke with patient who called stating Pt calling to get refill of Adderall 15mg  capsule. Patient states she ran out a couple days ago. [facility] [place].  Last office visit 09/07/20 with Dr Annamaria Boots.  Dr Annamaria Boots please advise.

## 2021-01-26 ENCOUNTER — Other Ambulatory Visit: Payer: Self-pay | Admitting: Family Medicine

## 2021-01-27 ENCOUNTER — Telehealth: Payer: Self-pay

## 2021-01-27 DIAGNOSIS — J329 Chronic sinusitis, unspecified: Secondary | ICD-10-CM

## 2021-01-27 NOTE — Telephone Encounter (Signed)
Copied from [place] [id number]. Topic: Referral - Request for Referral >> Jan 27, 2021 10:15 AM Ivar Drape wrote: Reason for CRM:  Referral wanted Has patient seen PCP for this complaint? No *If NO, is insurance requiring patient see PCP for this issue before PCP can refer them? Referral for which specialty:  ENT Preferred provider/office:  Dr. Beverly Gust Reason for referral: Low grade fever, coughing up dark green and orange mucus, coughing, sneezing and headaches

## 2021-01-27 NOTE — Telephone Encounter (Signed)
I call patient. She states she has a history of chronic sinusitis. She says she is an established patient of Dr. Tami Ribas and she usually sees him for this problem. Her insurance requires a referral from PCP before an appointment can be made. Patient is requesting that our office refer her to ENT for follow up of chronic sinusitis. She is currently having symptoms that were listed in the initial referral request. Symptoms started 2 days ago. Please advise.

## 2021-01-28 NOTE — Addendum Note (Signed)
Addended by: Birdie Sons on: 01/28/2021 10:33 AM   Modules accepted: Orders

## 2021-01-29 ENCOUNTER — Other Ambulatory Visit: Payer: Self-pay

## 2021-01-29 ENCOUNTER — Encounter (HOSPITAL_BASED_OUTPATIENT_CLINIC_OR_DEPARTMENT_OTHER): Payer: Self-pay

## 2021-01-29 ENCOUNTER — Emergency Department (HOSPITAL_BASED_OUTPATIENT_CLINIC_OR_DEPARTMENT_OTHER)
Admission: EM | Admit: 2021-01-29 | Discharge: 2021-01-30 | Disposition: A | Discharge status: Home / Self Care | Payer: Medicare Other | Attending: Emergency Medicine | Admitting: Emergency Medicine

## 2021-01-29 ENCOUNTER — Ambulatory Visit: Payer: Self-pay

## 2021-01-29 ENCOUNTER — Emergency Department (HOSPITAL_BASED_OUTPATIENT_CLINIC_OR_DEPARTMENT_OTHER): Payer: Medicare Other

## 2021-01-29 DIAGNOSIS — Z20822 Contact with and (suspected) exposure to covid-19: Secondary | ICD-10-CM | POA: Insufficient documentation

## 2021-01-29 DIAGNOSIS — J40 Bronchitis, not specified as acute or chronic: Secondary | ICD-10-CM

## 2021-01-29 DIAGNOSIS — R0602 Shortness of breath: Secondary | ICD-10-CM | POA: Diagnosis not present

## 2021-01-29 DIAGNOSIS — R059 Cough, unspecified: Secondary | ICD-10-CM | POA: Diagnosis not present

## 2021-01-29 DIAGNOSIS — J069 Acute upper respiratory infection, unspecified: Secondary | ICD-10-CM | POA: Insufficient documentation

## 2021-01-29 LAB — RESP PANEL BY RT-PCR (FLU A&B, COVID) ARPGX2
Influenza A by PCR: NEGATIVE
Influenza B by PCR: NEGATIVE
SARS Coronavirus 2 by RT PCR: NEGATIVE

## 2021-01-29 LAB — GROUP A STREP BY PCR: Group A Strep by PCR: NOT DETECTED

## 2021-01-29 MED ORDER — ALBUTEROL SULFATE (2.5 MG/3ML) 0.083% IN NEBU
INHALATION_SOLUTION | RESPIRATORY_TRACT | Status: AC
  Administered 2021-01-29: 2.5 mg via RESPIRATORY_TRACT
  Filled 2021-01-29: qty 3

## 2021-01-29 MED ORDER — AEROCHAMBER PLUS FLO-VU MEDIUM MISC: 1.0000 | Freq: Once | Status: DC

## 2021-01-29 MED ORDER — ALBUTEROL SULFATE (2.5 MG/3ML) 0.083% IN NEBU: 2.5000 mg | INHALATION_SOLUTION | Freq: Once | RESPIRATORY_TRACT | Status: AC

## 2021-01-29 MED ORDER — ALBUTEROL SULFATE HFA 108 (90 BASE) MCG/ACT IN AERS
2.0000 | INHALATION_SPRAY | Freq: Once | RESPIRATORY_TRACT | Status: DC
  Filled 2021-01-29: qty 6.7

## 2021-01-29 MED ORDER — CYCLOBENZAPRINE HCL 5 MG PO TABS
5.0000 mg | ORAL_TABLET | Freq: Once | ORAL | Status: AC
  Administered 2021-01-29: 5 mg via ORAL
  Filled 2021-01-29: qty 1

## 2021-01-29 MED ORDER — AEROCHAMBER PLUS FLO-VU MEDIUM MISC
1.0000 | Freq: Once | Status: AC
  Administered 2021-01-29: 1
  Filled 2021-01-29: qty 1

## 2021-01-29 NOTE — ED Triage Notes (Signed)
Patient here POV from Home with Sore Throat, Generalized Body Aches, Cough (Green Sputum), Fever, Congestion.  Symptoms have been present for approximately 1 week and have been worsening. Patient has been taking OTC Medication (Mucinex, Tylenol, Aleve, etc.)  BIB Electric Wheelchair, GCS 15.

## 2021-01-29 NOTE — ED Provider Notes (Signed)
[facility] Emergency Department Provider Note MRN:  [id number]  Arrival date & time: 01/30/21     Chief Complaint   URI   History of Present Illness   Karen [name] is a 45 y.o. year-old female with a history of Ehlers-Danlos syndrome presenting to the ED with chief complaint of URI.  1 week of persistent cough, sore throat.  Fever up to 102 earlier today that improved with Tylenol.  Also endorsing intermittent headache, dull and frontal.  Endorsing body aches, fatigue.  Denies neck pain or stiffness, no chest pain or shortness of breath, no abdominal pain, no nausea vomiting or diarrhea, no vaginal bleeding or discharge, no dysuria, no rash.  Vaccinated for COVID and has received 1 booster.  Symptoms are constant, mild to moderate, no exacerbating or alleviating factors.  Review of Systems  A complete 10 system review of systems was obtained and all systems are negative except as noted in the HPI and PMH.   Patient's Health History    Past Medical History:  Diagnosis Date   Ehlers-Danlos syndrome    Fibromyalgia    Headache    Sleep apnea    Tics of organic origin    Transient alteration of awareness     Past Surgical History:  Procedure Laterality Date   COLONOSCOPY WITH PROPOFOL N/A 02/13/2018   Procedure: COLONOSCOPY WITH PROPOFOL;  Surgeon: [name], MD;  Location: [facility] ENDOSCOPY;  Service: Endoscopy;  Laterality: N/A;   DILATION AND CURETTAGE OF UTERUS  2009   ESOPHAGOGASTRODUODENOSCOPY (EGD) WITH PROPOFOL  02/13/2018   Procedure: ESOPHAGOGASTRODUODENOSCOPY (EGD) WITH PROPOFOL;  Surgeon: [name], MD;  Location: [facility] ENDOSCOPY;  Service: Endoscopy;;   EYE SURGERY Left 1990   3 Surgeries on left eye to correct crossed eye   LAPAROSCOPY Left 2003   Fallopian Tube   [place]   3 Surgeries to repair broken arm   OTHER SURGICAL HISTORY Left    3 surgeries on her left thigh as an infant    OTHER SURGICAL HISTORY  1998   Jaw surgery   Right arm surgery  1987   x 3 due to fracture   TOENAIL EXCISION Bilateral 06/13/2019   Procedure: BILATERAL SECOND TOE PARTIAL NAIL ABLATION;  Surgeon: Erle Crocker, MD;  Location: [place];  Service: Orthopedics;  Laterality: Bilateral;  SURGERY REQUEST TIME 1 HOUR   TONSILLECTOMY  12/13/2002    Family History  Problem Relation Age of Onset   Depression Mother    Stroke Mother    Fibromyalgia Mother    Osteoarthritis Mother    Asthma Brother    Depression Brother    Migraines Brother    Asperger's syndrome Brother    Other Brother        Ehlers-Danlos Syndrome   Cancer Maternal Aunt    Asperger's syndrome Maternal Aunt    Breast cancer Maternal Aunt    Heart disease Paternal Aunt    Heart disease Paternal Uncle    Cervical cancer Maternal Grandmother    Osteoporosis Maternal Grandmother        Died at 88   Osteoarthritis Maternal Grandmother    Colon cancer Maternal Grandfather    Asthma Maternal Grandfather    Asperger's syndrome Maternal Grandfather    Emphysema Maternal Grandfather        Died at 64   Prostate cancer Maternal Grandfather    Heart attack Paternal Grandfather  Died at 104   Asthma Paternal Grandfather    Tuberculosis Paternal Grandfather    Cancer Cousin    Asperger's syndrome Cousin    Asperger's syndrome Other    Heart Problems Paternal Grandmother        Died at 58   Ehlers-Danlos syndrome Father    Ehlers-Danlos syndrome Brother     Social History   Socioeconomic History   Marital status: Single    Spouse name: Not on file   Number of children: Not on file   Years of education: Not on file   Highest education level: Not on file  Occupational History   Not on file  Tobacco Use   Smoking status: Never   Smokeless tobacco: Never  Vaping Use   Vaping Use: Never used  Substance and Sexual Activity   Alcohol use: No    Alcohol/week: 0.0 standard drinks   Drug  use: No   Sexual activity: Never  Other Topics Concern   Not on file  Social History Narrative   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    She lives with her mother.    She enjoys reading, watching TV, writing, and painting.    Social Determinants of Health   Financial Resource Strain: Not on file  Food Insecurity: Not on file  Transportation Needs: Not on file  Physical Activity: Not on file  Stress: Not on file  Social Connections: Not on file  Intimate Partner Violence: Not on file     Physical Exam   Vitals:   01/29/21 2325 01/30/21 0014  BP:  129/89  Pulse: 73 88  Resp: 18 16  Temp:    SpO2: 99% 100%    CONSTITUTIONAL: Well-appearing, NAD NEURO:  Alert and oriented x 3, no focal deficits EYES:  eyes equal and reactive ENT/NECK:  no LAD, no JVD CARDIO: Regular rate, well-perfused, normal S1 and S2 PULM:  CTAB no wheezing or rhonchi GI/GU:  normal bowel sounds, non-distended, non-tender MSK/SPINE:  No gross deformities, no edema SKIN:  no rash, atraumatic PSYCH:  Appropriate speech and behavior  *Additional and/or pertinent findings included in MDM below  Diagnostic and Interventional Summary    EKG Interpretation  Date/Time:    Ventricular Rate:    PR Interval:    QRS Duration:   QT Interval:    QTC Calculation:   R Axis:     Text Interpretation:          Labs Reviewed  GROUP A STREP BY PCR  RESP PANEL BY RT-PCR (FLU A&B, COVID) [id number]    DG Chest Portable 1 View  Final Result      Medications  cyclobenzaprine (FLEXERIL) tablet 5 mg (5 mg Oral Given 01/29/21 2317)  albuterol (PROVENTIL) (2.5 MG/3ML) 0.083% nebulizer solution 2.5 mg (2.5 mg Nebulization Given 01/29/21 2334)  AeroChamber Plus Flo-Vu Medium MISC 1 each (1 each Other Given 01/29/21 2346)     Procedures  /  Critical Care Procedures  ED Course and Medical Decision Making  I have reviewed the triage vital signs, the nursing notes, and pertinent available records from the  EMR.  Listed above are laboratory and imaging tests that I personally ordered, reviewed, and interpreted and then considered in my medical decision making (see below for details).  Symptoms consistent with URI, also considering pneumonia given the duration of symptoms, will obtain chest x-ray.  Also considering COVID-19, strep throat but rapid strep is negative.  Should be able to discharge plus or minus antibiotics  to cover for pneumonia.     Chest x-ray showing evidence of bronchitis but no lobar pneumonia.  Patient feeling better.  Plan is to discharge and give the likely viral illness 3 more days to start improving.  Providing a prescription for doxycycline if not improving or worsening during this time to cover for possible bacterial pneumonia.  Appropriate for discharge.  Barth Kirks. Sedonia Small, [place] [email or web address]  Final Clinical Impressions(s) / ED Diagnoses     ICD-10-CM   1. Bronchitis  J40       ED Discharge Orders          Ordered    doxycycline (VIBRAMYCIN) 100 MG capsule  2 times daily        01/30/21 0104             Discharge Instructions Discussed with and Provided to Patient:     Discharge Instructions      You were evaluated in the Emergency Department and after careful evaluation, we did not find any emergent condition requiring admission or further testing in the hospital.  Your exam/testing today is overall reassuring.  Symptoms seem to be due to a viral bronchitis.  You should improve with time.  Recommend continued Tylenol and/or muscle relaxers for discomfort, continue inhaler at home to help with cough.  As discussed, if your symptoms do not get better or worsen over the next 3 days, you can fill and start taking the doxycycline antibiotic to cover for possible bacterial pneumonia.  Please return to the Emergency Department if you experience any worsening of your condition.   Thank you for  allowing [place] to be a part of your care.        Maudie Flakes, MD 01/30/21 [phone]

## 2021-01-29 NOTE — ED Notes (Addendum)
Pt educated on importance of avoiding triggers to her asthma and to be more conscious of symptom monitoring so that she uses her inhaler before symptoms progress for more effective results. Pt also coached on proper use of MDI/DPI for optimal results as well. Pt able to express understanding of teaching. RT to monitor.

## 2021-01-29 NOTE — Telephone Encounter (Signed)
Called mom back, she answered, not a good connection, could not hear her, she hung up.

## 2021-01-29 NOTE — Telephone Encounter (Signed)
Patient's mom called and she says the patient has a higher temp than 102 based on she feels hotter, she's coughing up green phlegm. Lost connection on the phone. Attempted to call back x 3, left VM to return the call to the office.   Message from Luciana Axe sent at 01/29/2021  4:38 PM EDT  Pts mother called to check on a referral but disclosed that yesterday the pt had a temp of 102.0 and today she feels hotter. She reports that the thermometer broke and temp has not be taken today. She reports that pt has headache as well. Spitting up with green phelm, sore throat, sores in mouth.

## 2021-01-29 NOTE — Telephone Encounter (Addendum)
Patient's mom called and says the patient has a high fever above 102 today. She says yesterday it was 102, her thermometer broke and the patient feels hotter today. She says she has a sore throat, sores in her mouth, coughing up green phlegm for a week or more, also rusty colored phlegm. She say she has sinus congestion and needs the referral to Dr. Tami Ribas. I advised the referral was placed on 01/28/21. She says she will call the office on Monday. I advised to go to the ED. She says she was told by Dr. Tami Ribas to take her to [place]. She asked for the directions. She says she will take her there, [facility] [address] [address].  Reason for Disposition  Fever > 103 F (39.4 C)  Answer Assessment - Initial Assessment Questions 1. ONSET: "When did the cough begin?"      1 week or more 2. SEVERITY: "How bad is the cough today?"      Bad cough 3. SPUTUM: "Describe the color of your sputum" (none, dry cough; clear, white, yellow, green)     Green, rusty color 4. HEMOPTYSIS: "Are you coughing up any blood?" If so ask: "How much?" (flecks, streaks, tablespoons, etc.)     Rusty color that I'm sure is blood 5. DIFFICULTY BREATHING: "Are you having difficulty breathing?" If Yes, ask: "How bad is it?" (e.g., mild, moderate, severe)    - MILD: No SOB at rest, mild SOB with walking, speaks normally in sentences, can lie down, no retractions, pulse < 100.    - MODERATE: SOB at rest, SOB with minimal exertion and prefers to sit, cannot lie down flat, speaks in phrases, mild retractions, audible wheezing, pulse 100-120.    - SEVERE: Very SOB at rest, speaks in single words, struggling to breathe, sitting hunched forward, retractions, pulse > 120      N/A 6. FEVER: "Do you have a fever?" If Yes, ask: "What is your temperature, how was it measured, and when did it start?"     102 yesterday, thermometer broke, skin hotter than yesterday 7. CARDIAC HISTORY: "Do you have any history of heart disease?" (e.g., heart attack,  congestive heart failure)      N/A 8. LUNG HISTORY: "Do you have any history of lung disease?"  (e.g., pulmonary embolus, asthma, emphysema)     N/A 9. PE RISK FACTORS: "Do you have a history of blood clots?" (or: recent major surgery, recent prolonged travel, bedridden)     N/A 10. OTHER SYMPTOMS: "Do you have any other symptoms?" (e.g., runny nose, wheezing, chest pain)       Body aches, sinus congestion, sores in mouth 11. PREGNANCY: "Is there any chance you are pregnant?" "When was your last menstrual period?"       N/A 12. TRAVEL: "Have you traveled out of the country in the last month?" (e.g., travel history, exposures)       N/A  Protocols used: Cough - Acute Productive-A-AH

## 2021-01-30 MED ORDER — DOXYCYCLINE HYCLATE 100 MG PO CAPS: 100.0000 mg | ORAL_CAPSULE | Freq: Two times a day (BID) | ORAL | 0 refills | Status: AC

## 2021-01-30 NOTE — ED Notes (Signed)
RT ordered pt a spacer/aerochamber for use with MDI for better medication delivery. Pt education on how to use and teach back good.

## 2021-01-30 NOTE — ED Notes (Signed)
Pt verbalizes understanding of discharge instructions. Opportunity for questioning and answers were provided. Armand removed by staff, pt discharged from ED to home. Educated to pick up Rx if not feeling well in 3 days.

## 2021-01-30 NOTE — Discharge Instructions (Addendum)
You were evaluated in the Emergency Department and after careful evaluation, we did not find any emergent condition requiring admission or further testing in the hospital.  Your exam/testing today is overall reassuring.  Symptoms seem to be due to a viral bronchitis.  You should improve with time.  Recommend continued Tylenol and/or muscle relaxers for discomfort, continue inhaler at home to help with cough.  As discussed, if your symptoms do not get better or worsen over the next 3 days, you can fill and start taking the doxycycline antibiotic to cover for possible bacterial pneumonia.  Please return to the Emergency Department if you experience any worsening of your condition.   Thank you for allowing [place] to be a part of your care.

## 2021-02-01 ENCOUNTER — Other Ambulatory Visit (INDEPENDENT_AMBULATORY_CARE_PROVIDER_SITE_OTHER): Payer: Self-pay | Admitting: Pediatrics

## 2021-02-01 DIAGNOSIS — G2569 Other tics of organic origin: Secondary | ICD-10-CM

## 2021-02-01 NOTE — Telephone Encounter (Signed)
Contacted pharmacy as our records show the prescription for Haldol was sent 01/14/2021. They confirm they have the refills on file. Will deny so it can clear from our queue.

## 2021-02-03 ENCOUNTER — Other Ambulatory Visit: Payer: Self-pay | Admitting: Family Medicine

## 2021-02-04 DIAGNOSIS — R059 Cough, unspecified: Secondary | ICD-10-CM | POA: Diagnosis not present

## 2021-02-04 DIAGNOSIS — J329 Chronic sinusitis, unspecified: Secondary | ICD-10-CM | POA: Diagnosis not present

## 2021-02-12 ENCOUNTER — Other Ambulatory Visit: Payer: Self-pay | Admitting: Family Medicine

## 2021-02-12 MED ORDER — FENTANYL 100 MCG/HR TD PT72: 1.0000 | MEDICATED_PATCH | TRANSDERMAL | 0 refills | Status: DC

## 2021-02-12 NOTE — Telephone Encounter (Signed)
Medication Refill - Medication: fentaNYL (DURAGESIC) 100 MCG/HR   Has the patient contacted their pharmacy? Yes.    (Agent: If yes, when and what did the pharmacy advise?) Contact PCP office  Preferred Pharmacy (with phone number or street name):  [id number] [name], [place]  [address] [place], [place] [address]   Agent: Please be advised that RX refills may take up to 3 business days. We ask that you follow-up with your pharmacy.

## 2021-02-12 NOTE — Telephone Encounter (Signed)
Requested medication (s) are due for refill today: yes  Requested medication (s) are on the active medication list:  yes  Last refill:  01/15/2021  Future visit scheduled: yes  Notes to clinic: This refill cannot be delegated   Requested Prescriptions  Pending Prescriptions Disp Refills   fentaNYL (DURAGESIC) 100 MCG/HR 10 patch 0    Sig: Place 1 patch onto the skin every 3 (three) days.      Not Delegated - Analgesics:  Opioid Agonists Failed - 02/12/2021 11:59 AM      Failed - This refill cannot be delegated      Failed - Urine Drug Screen completed in last 360 days      Passed - Valid encounter within last 6 months    Recent Outpatient Visits           2 months ago Ehlers-Danlos syndrome   [facility] Birdie Sons, MD   11 months ago Sinusitis, unspecified chronicity, unspecified location   [facility] Birdie Sons, MD   1 year ago Rheumatoid arthritis, involving unspecified site, unspecified whether rheumatoid factor present [facility])   [facility] Birdie Sons, MD   1 year ago Breast lump on left side at 1 o'clock position   [facility] Flinchum, Kelby Aline, FNP   2 years ago Mild persistent asthma with exacerbation   [facility] Birdie Sons, MD

## 2021-02-17 ENCOUNTER — Other Ambulatory Visit: Payer: Self-pay | Admitting: Internal Medicine

## 2021-02-17 ENCOUNTER — Other Ambulatory Visit: Payer: Self-pay | Admitting: Family Medicine

## 2021-02-17 MED ORDER — TIZANIDINE HCL 4 MG PO TABS: 2.0000 mg | ORAL_TABLET | Freq: Three times a day (TID) | ORAL | 4 refills | Status: DC | PRN

## 2021-02-17 NOTE — Telephone Encounter (Signed)
LM to call back with patient  Medication:Adderall XR 15mg  Last OV:09/07/20 Last Filled:01/18/21, quantity 31 with 0 refills   CY please advise if willing to refill. Medication has been pended. Thanks :)

## 2021-02-17 NOTE — Telephone Encounter (Signed)
Requested medication (s) are due for refill today: Yes  Requested medication (s) are on the active medication list: Yes  Last refill:  01/15/19  Future visit scheduled: No  Notes to clinic:  See request.    Requested Prescriptions  Pending Prescriptions Disp Refills   tiZANidine (ZANAFLEX) 4 MG tablet 90 tablet 4    Sig: Take 0.5-1 tablets (2-4 mg total) by mouth every 8 (eight) hours as needed for muscle spasms.      Not Delegated - Cardiovascular:  Alpha-2 Agonists - tizanidine Failed - 02/17/2021  3:10 PM      Failed - This refill cannot be delegated      Passed - Valid encounter within last 6 months    Recent Outpatient Visits           2 months ago Ehlers-Danlos syndrome   [facility] Birdie Sons, MD   11 months ago Sinusitis, unspecified chronicity, unspecified location   [facility] Birdie Sons, MD   1 year ago Rheumatoid arthritis, involving unspecified site, unspecified whether rheumatoid factor present [facility])   [facility] Birdie Sons, MD   1 year ago Breast lump on left side at 1 o'clock position   [facility] Flinchum, Kelby Aline, FNP   2 years ago Mild persistent asthma with exacerbation   [facility] [place], MD       Future Appointments             In 5 days [place], Nicki Reaper, [place]

## 2021-02-17 NOTE — Telephone Encounter (Signed)
Medication Refill - Medication:   tiZANidine (ZANAFLEX) 4 MG tablet   Has the patient contacted their pharmacy? Yes.  Pt needs to contact pcp office. Pt declined to make an appt.  (Agent: If no, request that the patient contact the pharmacy for the refill.) (Agent: If yes, when and what did the pharmacy advise?)  Preferred Pharmacy (with phone number or street name):   [name], [place]  [place] [address]  Phone: [phone] Fax: [phone]    Agent: Please be advised that RX refills may take up to 3 business days. We ask that you follow-up with your pharmacy.

## 2021-02-18 ENCOUNTER — Other Ambulatory Visit: Payer: Self-pay | Admitting: Family Medicine

## 2021-02-18 MED ORDER — AMPHETAMINE-DEXTROAMPHET ER 15 MG PO CP24: 15.0000 mg | ORAL_CAPSULE | ORAL | 0 refills | Status: DC

## 2021-02-18 NOTE — Telephone Encounter (Signed)
Called and left a detailed message that pt's script had been refilled and to call back if needed. Nothing further needed at this time.

## 2021-02-18 NOTE — Telephone Encounter (Signed)
Adderall refilled

## 2021-02-22 ENCOUNTER — Encounter: Payer: Self-pay | Admitting: Urology

## 2021-02-22 ENCOUNTER — Ambulatory Visit (INDEPENDENT_AMBULATORY_CARE_PROVIDER_SITE_OTHER): Payer: Medicare Other | Admitting: Urology

## 2021-02-22 ENCOUNTER — Other Ambulatory Visit: Payer: Self-pay

## 2021-02-22 VITALS — BP 143/89 | HR 103 | Ht 59.0 in | Wt 140.0 lb

## 2021-02-22 DIAGNOSIS — R339 Retention of urine, unspecified: Secondary | ICD-10-CM

## 2021-02-22 MED ORDER — TAMSULOSIN HCL 0.4 MG PO CAPS
0.4000 mg | ORAL_CAPSULE | Freq: Every day | ORAL | 3 refills | Status: DC
Start: 2021-02-22 — End: 2021-11-05

## 2021-02-22 NOTE — Progress Notes (Signed)
02/22/2021 11:14 AM   Karen [name] 07-22-1976 [id number]  Referring provider: Birdie Sons, [place] [place],  [place] [address]  Chief Complaint  Patient presents with   Medication Refill    HPI: Patient has a 10-year history of flow symptoms.  She must lean forward in order to void.  She likely cannot void unless she leans forward.  The flow was initially steady and then she double or triple voids.  She is never taken medication.  She voids every 1-2 hours gets up once a night.  She wears 1 damp pad a day from intermittent leakage.   She is Ehlers-Danlos syndrome and is wheelchair-bound because of it.  She gets radiofrequency treatments to her lower back and neck.   Patient has flow symptoms.  She has done well with them.  She had a recent bladder infection.  Her residual was 178 mL.  I thought I would try her on Flomax as a trial and get a renal ultrasound to make certain she does not have silent hydronephrosis.  I think otherwise we need to have reasonable treatment goal.  I will not be offering sacral nerve stimulation   Today Frequency is stable.  Ultrasound within normal limits Flow much better.  Still leans forward.  Did not wish to see physical therapy.  Clinically not infected  Today Flow stable.  Flow responds well to Flomax.  Incomplete bladder burning stable.  No infection.  Frequency stable        PMH: Past Medical History:  Diagnosis Date   Ehlers-Danlos syndrome    Fibromyalgia    Headache    Sleep apnea    Tics of organic origin    Transient alteration of awareness     Surgical History: Past Surgical History:  Procedure Laterality Date   COLONOSCOPY WITH PROPOFOL N/A 02/13/2018   Procedure: COLONOSCOPY WITH PROPOFOL;  Surgeon: [name], MD;  Location: [facility] ENDOSCOPY;  Service: Endoscopy;  Laterality: N/A;   DILATION AND CURETTAGE OF UTERUS  2009   ESOPHAGOGASTRODUODENOSCOPY (EGD) WITH PROPOFOL  02/13/2018   Procedure:  ESOPHAGOGASTRODUODENOSCOPY (EGD) WITH PROPOFOL;  Surgeon: [name], MD;  Location: [facility];  Service: Endoscopy;;   EYE SURGERY Left 1990   3 Surgeries on left eye to correct crossed eye   LAPAROSCOPY Left 2003   Fallopian Tube   [place]   3 Surgeries to repair broken arm   OTHER SURGICAL HISTORY Left    3 surgeries on her left thigh as an infant   OTHER SURGICAL HISTORY  1998   Jaw surgery   Right arm surgery  1987   x 3 due to fracture   TOENAIL EXCISION Bilateral 06/13/2019   Procedure: BILATERAL SECOND TOE PARTIAL NAIL ABLATION;  Surgeon: Erle Crocker, MD;  Location: [facility];  Service: Orthopedics;  Laterality: Bilateral;  SURGERY REQUEST TIME 1 HOUR   TONSILLECTOMY  12/13/2002    Home Medications:  Allergies as of 02/22/2021       Reactions   Chocolate Flavor    Other reaction(s): Other (See Comments) Wheezing, acne   Demerol [meperidine] Other (See Comments)   Slow to wake up when this drug is given.    Keflex [cephalexin] Nausea And Vomiting   Morphine And Related Nausea And Vomiting   Toradol [ketorolac Tromethamine] Nausea And Vomiting   Augmentin  [amoxicillin-pot Clavulanate]    Chocolate    GI distress  Nsaids    patient develops ulcers   Strawberry Extract    GI distress   Tape Dermatitis        Medication List        Accurate as of February 22, 2021 11:14 AM. If you have any questions, ask your nurse or doctor.          albuterol 108 (90 Base) MCG/ACT inhaler Commonly known as: Ventolin HFA Inhale 2 puffs into the lungs every 6 (six) hours as needed.   amphetamine-dextroamphetamine 15 MG 24 hr capsule Commonly known as: Adderall XR Take 1 capsule by mouth every morning.   azelastine 0.05 % ophthalmic solution Commonly known as: OPTIVAR Apply to eye as directed. 1 drop in each eye twice daily   busPIRone 10 MG tablet Commonly known as: BUSPAR Take 10 mg  by mouth 2 (two) times daily.   busPIRone 15 MG tablet Commonly known as: BUSPAR TAKE 1 TABLET BY MOUTH 2 TIMES DAILY.   celecoxib 200 MG capsule Commonly known as: CELEBREX TAKE 1 CAPSULE BY MOUTH TWICE A DAY   diclofenac Sodium 1 % Gel Commonly known as: VOLTAREN APPLY AS NEEDED 3 TIMES A DAY   DULoxetine 60 MG capsule Commonly known as: CYMBALTA TAKE 2 CAPSULES BY MOUTH EVERY DAY   fentaNYL 100 MCG/HR Commonly known as: [place] 1 patch onto the skin every 3 (three) days.   fexofenadine 180 MG tablet Commonly known as: ALLEGRA Take 180 mg by mouth daily.   fluticasone 50 MCG/ACT nasal spray Commonly known as: FLONASE SPRAY 2 SPRAYS INTO EACH NOSTRIL EVERY DAY   Fluticasone-Salmeterol 250-50 MCG/DOSE Aepb Commonly known as: ADVAIR Inhale 1 puff into the lungs every 12 (twelve) hours. Rinse mouth after use   gabapentin 600 MG tablet Commonly known as: NEURONTIN TAKE 2 TABLETS IN THE MORNING AND 3 TABLETS AT BEDTIME   haloperidol 0.5 MG tablet Commonly known as: HALDOL TAKE 1/2 A TAB BY MOUTH TWICE A DAY   loratadine 10 MG tablet Commonly known as: CLARITIN Take 10 mg by mouth daily.   omeprazole 40 MG capsule Commonly known as: PRILOSEC TAKE 1 CAPSULE (40 MG TOTAL) BY MOUTH DAILY.   ondansetron 4 MG disintegrating tablet Commonly known as: ZOFRAN-ODT DISSOLVE 1 TABLET IN MOUTH EVERY 8 HOURS FOR NAUSEA AND VOMITING   pregabalin 300 MG capsule Commonly known as: LYRICA Take 1 capsule (300 mg total) by mouth 2 (two) times daily.   QUEtiapine 200 MG tablet Commonly known as: SEROQUEL TAKE 1 TABLET BY MOUTH AT BEDTIME.   tamsulosin 0.4 MG Caps capsule Commonly known as: FLOMAX Take 1 capsule (0.4 mg total) by mouth daily.   tiZANidine 4 MG tablet Commonly known as: ZANAFLEX Take 0.5-1 tablets (2-4 mg total) by mouth every 8 (eight) hours as needed for muscle spasms.        Allergies:  Allergies  Allergen Reactions   Chocolate Flavor      Other reaction(s): Other (See Comments) Wheezing, acne   Demerol [Meperidine] Other (See Comments)    Slow to wake up when this drug is given.    Keflex [Cephalexin] Nausea And Vomiting   Morphine And Related Nausea And Vomiting   Toradol [Ketorolac Tromethamine] Nausea And Vomiting   Augmentin  [Amoxicillin-Pot Clavulanate]    Chocolate     GI distress   Nsaids     patient develops ulcers   Strawberry Extract     GI distress   Tape Dermatitis    Family History: Family  History  Problem Relation Age of Onset   Depression Mother    Stroke Mother    Fibromyalgia Mother    Osteoarthritis Mother    Asthma Brother    Depression Brother    Migraines Brother    Asperger's syndrome Brother    Other Brother        Ehlers-Danlos Syndrome   Cancer Maternal Aunt    Asperger's syndrome Maternal Aunt    Breast cancer Maternal Aunt    Heart disease Paternal Aunt    Heart disease Paternal Uncle    Cervical cancer Maternal Grandmother    Osteoporosis Maternal Grandmother        Died at 76   Osteoarthritis Maternal Grandmother    Colon cancer Maternal Grandfather    Asthma Maternal Grandfather    Asperger's syndrome Maternal Grandfather    Emphysema Maternal Grandfather        Died at 23   Prostate cancer Maternal Grandfather    Heart attack Paternal Grandfather        Died at 21   Asthma Paternal Grandfather    Tuberculosis Paternal Grandfather    Cancer Cousin    Asperger's syndrome Cousin    Asperger's syndrome Other    Heart Problems Paternal Grandmother        Died at 79   Ehlers-Danlos syndrome Father    Ehlers-Danlos syndrome Brother     Social History:  reports that she has never smoked. She has never used smokeless tobacco. She reports that she does not drink alcohol and does not use drugs.  ROS:                                        Physical Exam: There were no vitals taken for this visit.  Constitutional:  Alert and oriented, No acute  distress. HEENT: [place] AT, moist mucus membranes.  Trachea midline, no masses.   Laboratory Data: Lab Results  Component Value Date   WBC 5.6 10/15/2019   HGB 12.8 10/15/2019   HCT 37.9 10/15/2019   MCV 85 10/15/2019   PLT 256 10/15/2019    Lab Results  Component Value Date   CREATININE 1.00 10/15/2019    No results found for: PSA  No results found for: TESTOSTERONE  No results found for: HGBA1C  Urinalysis    Component Value Date/Time   COLORURINE YELLOW (A) 10/28/2017 1648   APPEARANCEUR Cloudy (A) 11/18/2019 0953   LABSPEC 1.009 10/28/2017 1648   LABSPEC 1.004 02/19/2014 0142   PHURINE 5.0 10/28/2017 1648   GLUCOSEU Negative 11/18/2019 0953   GLUCOSEU Negative 02/19/2014 0142   HGBUR NEGATIVE 10/28/2017 1648   BILIRUBINUR Negative 11/18/2019 0953   BILIRUBINUR Negative 02/19/2014 0142   KETONESUR NEGATIVE 10/28/2017 1648   PROTEINUR Negative 11/18/2019 0953   PROTEINUR NEGATIVE 10/28/2017 1648   UROBILINOGEN 0.2 10/09/2019 1632   NITRITE Negative 11/18/2019 0953   NITRITE NEGATIVE 10/28/2017 1648   LEUKOCYTESUR Negative 11/18/2019 0953   LEUKOCYTESUR Negative 02/19/2014 0142    Pertinent Imaging: 90x3 Flomax sent to pharmacy and see in 1 year  Assessment & Plan:  90x3 Flomax sent to pharmacy and see in 1 year  There are no diagnoses linked to this encounter.  No follow-ups on file.  Reece Packer, MD  [facility] [address] [place], [place] [address] [phone]

## 2021-02-23 DIAGNOSIS — J329 Chronic sinusitis, unspecified: Secondary | ICD-10-CM | POA: Diagnosis not present

## 2021-02-23 DIAGNOSIS — R509 Fever, unspecified: Secondary | ICD-10-CM | POA: Diagnosis not present

## 2021-02-26 ENCOUNTER — Telehealth: Payer: Self-pay

## 2021-02-26 DIAGNOSIS — Z86010 Personal history of colonic polyps: Secondary | ICD-10-CM

## 2021-02-26 NOTE — Telephone Encounter (Signed)
Need more info. What are her symptoms.

## 2021-02-26 NOTE — Telephone Encounter (Signed)
Copied from [place] [phone]. Topic: Referral - Request for Referral >> Feb 26, 2021  2:23 PM Erick Blinks wrote: Has patient seen PCP for this complaint? Yes.   *If NO, is insurance requiring patient see PCP for this issue before PCP can refer them? Referral for which specialty: GI Preferred provider/office: Specifically the same person she saw previously, she does not know the provider's name Reason for referral:   May have an ulcer   Best contact: [phone] >> Feb 26, 2021  2:56 PM Tessa Lerner A wrote: Patient would like to see Dr. [name] at [facility]

## 2021-03-01 ENCOUNTER — Other Ambulatory Visit: Payer: Self-pay | Admitting: Family Medicine

## 2021-03-02 NOTE — Telephone Encounter (Signed)
Tried calling patient. Left message to call back. OK for [facility] triage to speak with patient.  

## 2021-03-02 NOTE — Telephone Encounter (Signed)
Patient called and asked about the need for the GI referral. She says it's time for her colonoscopy, so she will need the referral, no symptoms at present.

## 2021-03-04 ENCOUNTER — Encounter: Payer: Self-pay | Admitting: *Deleted

## 2021-03-08 DIAGNOSIS — M47814 Spondylosis without myelopathy or radiculopathy, thoracic region: Secondary | ICD-10-CM | POA: Diagnosis not present

## 2021-03-08 DIAGNOSIS — M47816 Spondylosis without myelopathy or radiculopathy, lumbar region: Secondary | ICD-10-CM | POA: Diagnosis not present

## 2021-03-08 DIAGNOSIS — M791 Myalgia, unspecified site: Secondary | ICD-10-CM | POA: Diagnosis not present

## 2021-03-12 ENCOUNTER — Other Ambulatory Visit: Payer: Self-pay | Admitting: Family Medicine

## 2021-03-12 NOTE — Telephone Encounter (Signed)
   Notes to clinic Historical provider, has this med with two different doses on current med list.

## 2021-03-13 ENCOUNTER — Other Ambulatory Visit: Payer: Self-pay | Admitting: Family Medicine

## 2021-03-13 NOTE — Telephone Encounter (Signed)
last RF 02/18/21 # 30 1 RF

## 2021-03-15 ENCOUNTER — Other Ambulatory Visit: Payer: Self-pay | Admitting: Family Medicine

## 2021-03-15 NOTE — Telephone Encounter (Signed)
Medication Refill - Medication:   fentaNYL (DURAGESIC) 100 MCG/HR   Has the patient contacted their pharmacy? No.  Preferred Pharmacy (with phone number or street name):  [name] Phone:  [phone]:  [phone] Agent: Please be advised that RX refills may take up to 3 business days. We ask that you follow-up with your pharmacy.

## 2021-03-15 NOTE — Telephone Encounter (Signed)
Please review for refill. Refill not delegated per protocol.  Last refill 02/12/21, 10 patches Next OV: 04/06/21

## 2021-03-16 MED ORDER — FENTANYL 100 MCG/HR TD PT72: 1.0000 | MEDICATED_PATCH | TRANSDERMAL | 0 refills | Status: DC

## 2021-03-16 NOTE — Telephone Encounter (Signed)
LMTCB 03/16/2021.  PEC please verify pt's dose of buspirone.   Thanks,   -Mickel Baas

## 2021-03-16 NOTE — Telephone Encounter (Signed)
Please clarify with patient whether she is taking the '10mg'$  or the '15mg'$  buspirone.

## 2021-03-22 ENCOUNTER — Telehealth: Payer: Self-pay | Admitting: Internal Medicine

## 2021-03-22 MED ORDER — AMPHETAMINE-DEXTROAMPHET ER 15 MG PO CP24: 15.0000 mg | ORAL_CAPSULE | ORAL | 0 refills | Status: DC

## 2021-03-22 NOTE — Telephone Encounter (Signed)
Patient requested Adderall XR '15mg'$  refill to be sent to [facility].  Last refill- Adderall XR '15mg'$ , take 1 capsule by mouth every morning, #31, with no refills.  Message routed to Dr. Annamaria Boots to advise   Allergies  Allergen Reactions   Chocolate Flavor     Other reaction(s): Other (See Comments) Wheezing, acne   Demerol [Meperidine] Other (See Comments)    Slow to wake up when this drug is given.    Keflex [Cephalexin] Nausea And Vomiting   Morphine And Related Nausea And Vomiting   Toradol [Ketorolac Tromethamine] Nausea And Vomiting   Augmentin  [Amoxicillin-Pot Clavulanate]    Chocolate     GI distress   Nsaids     patient develops ulcers   Strawberry Extract     GI distress   Tape Dermatitis   Current Outpatient Medications on File Prior to Visit  Medication Sig Dispense Refill   albuterol (VENTOLIN HFA) 108 (90 Base) MCG/ACT inhaler Inhale 2 puffs into the lungs every 6 (six) hours as needed. 8 g 12   amphetamine-dextroamphetamine (ADDERALL XR) 15 MG 24 hr capsule Take 1 capsule by mouth every morning. 31 capsule 0   azelastine (OPTIVAR) 0.05 % ophthalmic solution Apply to eye as directed. 1 drop in each eye twice daily     busPIRone (BUSPAR) 10 MG tablet Take 10 mg by mouth 2 (two) times daily.     busPIRone (BUSPAR) 15 MG tablet TAKE 1 TABLET BY MOUTH 2 TIMES DAILY. 180 tablet 2   celecoxib (CELEBREX) 200 MG capsule TAKE 1 CAPSULE BY MOUTH TWICE A DAY 60 capsule 5   diclofenac Sodium (VOLTAREN) 1 % GEL APPLY AS NEEDED 3 TIMES A DAY 100 g 0   DULoxetine (CYMBALTA) 60 MG capsule TAKE 2 CAPSULES BY MOUTH EVERY DAY 180 capsule 3   fentaNYL (DURAGESIC) 100 MCG/HR Place 1 patch onto the skin every 3 (three) days. 10 patch 0   fexofenadine (ALLEGRA) 180 MG tablet Take 180 mg by mouth daily.     fluticasone (FLONASE) 50 MCG/ACT nasal spray SPRAY 2 SPRAYS INTO EACH NOSTRIL EVERY DAY 16 g 2   fluticasone (FLOVENT HFA) 110 MCG/ACT inhaler SMARTSIG:2 Puff(s) By Mouth  Twice Daily     Fluticasone-Salmeterol (ADVAIR) 250-50 MCG/DOSE AEPB Inhale 1 puff into the lungs every 12 (twelve) hours. Rinse mouth after use 60 each 11   gabapentin (NEURONTIN) 600 MG tablet TAKE 2 TABLETS IN THE MORNING AND 3 TABLETS AT BEDTIME 155 tablet 5   haloperidol (HALDOL) 0.5 MG tablet TAKE 1/2 A TAB BY MOUTH TWICE A DAY 31 tablet 5   loratadine (CLARITIN) 10 MG tablet Take 10 mg by mouth daily.     omeprazole (PRILOSEC) 40 MG capsule TAKE 1 CAPSULE (40 MG TOTAL) BY MOUTH DAILY. 30 capsule 1   ondansetron (ZOFRAN-ODT) 4 MG disintegrating tablet DISSOLVE 1 TABLET IN MOUTH EVERY 8 HOURS FOR NAUSEA AND VOMITING 30 tablet 5   pregabalin (LYRICA) 300 MG capsule Take 1 capsule (300 mg total) by mouth 2 (two) times daily. 180 capsule 1   QUEtiapine (SEROQUEL) 200 MG tablet TAKE 1 TABLET BY MOUTH AT BEDTIME. 90 tablet 5   tamsulosin (FLOMAX) 0.4 MG CAPS capsule Take 1 capsule (0.4 mg total) by mouth daily. 90 capsule 3   tiZANidine (ZANAFLEX) 4 MG tablet Take 0.5-1 tablets (2-4 mg total) by mouth every 8 (eight) hours as needed for muscle spasms. 90 tablet 4   No current facility-administered medications on file prior  to visit.

## 2021-03-22 NOTE — Telephone Encounter (Signed)
Adderall refilled

## 2021-03-23 NOTE — Telephone Encounter (Signed)
I called and spoke with patient. She states she takes both the '10mg'$  and the '15mg'$  dose of buspirone.  She takes one tablet of the '15mg'$  tablet, and one tablet of the '10mg'$  tablet in the mornings and again at night (totaling '25mg'$  twice daily).

## 2021-03-26 ENCOUNTER — Encounter: Payer: Self-pay | Admitting: Family Medicine

## 2021-03-26 ENCOUNTER — Ambulatory Visit (INDEPENDENT_AMBULATORY_CARE_PROVIDER_SITE_OTHER): Payer: Medicare Other | Admitting: Family Medicine

## 2021-03-26 ENCOUNTER — Other Ambulatory Visit: Payer: Self-pay

## 2021-03-26 VITALS — BP 123/90 | HR 86 | Ht 59.0 in | Wt 140.0 lb

## 2021-03-26 DIAGNOSIS — G8929 Other chronic pain: Secondary | ICD-10-CM | POA: Diagnosis not present

## 2021-03-26 DIAGNOSIS — M5136 Other intervertebral disc degeneration, lumbar region: Secondary | ICD-10-CM | POA: Diagnosis not present

## 2021-03-26 DIAGNOSIS — F32A Depression, unspecified: Secondary | ICD-10-CM | POA: Diagnosis not present

## 2021-03-26 DIAGNOSIS — Q796 Ehlers-Danlos syndrome, unspecified: Secondary | ICD-10-CM

## 2021-03-26 DIAGNOSIS — M797 Fibromyalgia: Secondary | ICD-10-CM

## 2021-03-26 DIAGNOSIS — M549 Dorsalgia, unspecified: Secondary | ICD-10-CM | POA: Diagnosis not present

## 2021-03-26 MED ORDER — CYCLOBENZAPRINE HCL 5 MG PO TABS: 5.0000 mg | ORAL_TABLET | Freq: Three times a day (TID) | ORAL | 2 refills | Status: DC | PRN

## 2021-03-26 NOTE — Patient Instructions (Addendum)
Please review the attached list of medications and notify my office if there are any errors.   Please bring all of your medications to every appointment so we can make sure that our medication list is the same as yours  Let me know if you want to change the buspirone to one '30mg'$  tablet twice a day

## 2021-03-26 NOTE — Progress Notes (Signed)
Established patient visit   Patient: Karen [name]   DOB: Mar 21, 1976   45 y.o. Female  MRN: [id number] Visit Date: 03/26/2021  Today's healthcare provider: Lelon Huh, MD   No chief complaint on file.  Subjective    HPI  -Pain all over (body aches), day before new patch has more pain than usual for the last few months -Needs new prescription for wheelchair (wants to call Alfredia Ferguson) along with referral for PT/OT for power wheelchair evaluation faxed to  [phone]    Medications: Outpatient Medications Prior to Visit  Medication Sig   albuterol (VENTOLIN HFA) 108 (90 Base) MCG/ACT inhaler Inhale 2 puffs into the lungs every 6 (six) hours as needed.   amphetamine-dextroamphetamine (ADDERALL XR) 15 MG 24 hr capsule Take 1 capsule by mouth every morning.   azelastine (OPTIVAR) 0.05 % ophthalmic solution Apply to eye as directed. 1 drop in each eye twice daily   busPIRone (BUSPAR) 10 MG tablet Take 1 tablet (10 mg total) by mouth 2 (two) times daily. Take along with '15mg'$  tablet twice a day   busPIRone (BUSPAR) 15 MG tablet TAKE 1 TABLET BY MOUTH 2 TIMES DAILY.   celecoxib (CELEBREX) 200 MG capsule TAKE 1 CAPSULE BY MOUTH TWICE A DAY   diclofenac Sodium (VOLTAREN) 1 % GEL APPLY AS NEEDED 3 TIMES A DAY   DULoxetine (CYMBALTA) 60 MG capsule TAKE 2 CAPSULES BY MOUTH EVERY DAY   fentaNYL (DURAGESIC) 100 MCG/HR Place 1 patch onto the skin every 3 (three) days.   fexofenadine (ALLEGRA) 180 MG tablet Take 180 mg by mouth daily.   fluticasone (FLONASE) 50 MCG/ACT nasal spray SPRAY 2 SPRAYS INTO EACH NOSTRIL EVERY DAY   fluticasone (FLOVENT HFA) 110 MCG/ACT inhaler SMARTSIG:2 Puff(s) By Mouth Twice Daily   Fluticasone-Salmeterol (ADVAIR) 250-50 MCG/DOSE AEPB Inhale 1 puff into the lungs every 12 (twelve) hours. Rinse mouth after use   gabapentin (NEURONTIN) 600 MG tablet TAKE 2 TABLETS IN THE MORNING AND 3 TABLETS AT BEDTIME   haloperidol (HALDOL) 0.5 MG tablet TAKE 1/2 A TAB  BY MOUTH TWICE A DAY   loratadine (CLARITIN) 10 MG tablet Take 10 mg by mouth daily.   omeprazole (PRILOSEC) 40 MG capsule TAKE 1 CAPSULE (40 MG TOTAL) BY MOUTH DAILY.   ondansetron (ZOFRAN-ODT) 4 MG disintegrating tablet DISSOLVE 1 TABLET IN MOUTH EVERY 8 HOURS FOR NAUSEA AND VOMITING   pregabalin (LYRICA) 300 MG capsule Take 1 capsule (300 mg total) by mouth 2 (two) times daily.   QUEtiapine (SEROQUEL) 200 MG tablet TAKE 1 TABLET BY MOUTH AT BEDTIME.   tamsulosin (FLOMAX) 0.4 MG CAPS capsule Take 1 capsule (0.4 mg total) by mouth daily.   tiZANidine (ZANAFLEX) 4 MG tablet Take 0.5-1 tablets (2-4 mg total) by mouth every 8 (eight) hours as needed for muscle spasms.   No facility-administered medications prior to visit.        Objective    BP 123/90 (BP Location: Right Arm, Patient Position: Sitting, Cuff Size: Normal)   Pulse 86   Ht '4\' 11"'$  (1.499 m)   Wt 140 lb (63.5 kg)   SpO2 100%   BMI 28.28 kg/m     Physical Exam   General: Appearance:     Overweight female wheelchair confined, in no acute distress  Eyes:    PERRL, conjunctiva/corneas clear, EOM's intact       Lungs:     Clear to auscultation bilaterally, respirations unlabored  Heart:    Normal  heart rate. Normal rhythm. No murmurs, rubs, or gallops.    MS:   All extremities are intact.    Neurologic:   Awake, alert, oriented x 3. No apparent focal neurological defect.        Assessment & Plan     1. Fibromyalgia She states cyclobenzaprine given at Urgent Care was very helpful - cyclobenzaprine (FLEXERIL) 5 MG tablet; Take 1 tablet (5 mg total) by mouth 3 (three) times daily as needed for muscle spasms. Do not take within 6 hours of taking tizandine  Dispense: 30 tablet; Refill: 2  She requests increased dose of Fentanyl patch which was declined today.   2. Ehlers-Danlos syndrome   3. Depression, unspecified depression type Currently taking both a 15 and a '10mg'$  buspirone BID which has been helpful. Recommend we  change to a single '30mg'$  buspirone BID but she prefers to stay on her current regiment.   4. Chronic bilateral back pain, unspecified back location   5. DDD (degenerative disc disease), lumbar Stable on current medications.   New orders printed for wheelchair and PT/OT wheelchair evaluation signed and sent to medical records to be faxed.        The entirety of the information documented in the History of Present Illness, Review of Systems and Physical Exam were personally obtained by me. Portions of this information were initially documented by the CMA and reviewed by me for thoroughness and accuracy.     Lelon Huh, MD  [facility] [phone] (phone) [phone] (fax)  [place]

## 2021-04-06 ENCOUNTER — Telehealth (INDEPENDENT_AMBULATORY_CARE_PROVIDER_SITE_OTHER): Payer: Self-pay | Admitting: Pediatrics

## 2021-04-06 ENCOUNTER — Ambulatory Visit: Payer: Medicare Other | Admitting: Family Medicine

## 2021-04-06 DIAGNOSIS — K219 Gastro-esophageal reflux disease without esophagitis: Secondary | ICD-10-CM | POA: Diagnosis not present

## 2021-04-06 DIAGNOSIS — F29 Unspecified psychosis not due to a substance or known physiological condition: Secondary | ICD-10-CM | POA: Diagnosis not present

## 2021-04-06 DIAGNOSIS — F323 Major depressive disorder, single episode, severe with psychotic features: Secondary | ICD-10-CM | POA: Diagnosis not present

## 2021-04-06 DIAGNOSIS — Z6823 Body mass index (BMI) 23.0-23.9, adult: Secondary | ICD-10-CM | POA: Diagnosis not present

## 2021-04-06 DIAGNOSIS — R440 Auditory hallucinations: Secondary | ICD-10-CM | POA: Diagnosis not present

## 2021-04-06 DIAGNOSIS — R443 Hallucinations, unspecified: Secondary | ICD-10-CM | POA: Diagnosis not present

## 2021-04-06 DIAGNOSIS — F22 Delusional disorders: Secondary | ICD-10-CM | POA: Diagnosis not present

## 2021-04-06 DIAGNOSIS — Z8669 Personal history of other diseases of the nervous system and sense organs: Secondary | ICD-10-CM | POA: Diagnosis not present

## 2021-04-06 DIAGNOSIS — G40909 Epilepsy, unspecified, not intractable, without status epilepticus: Secondary | ICD-10-CM | POA: Diagnosis not present

## 2021-04-06 DIAGNOSIS — M62838 Other muscle spasm: Secondary | ICD-10-CM | POA: Diagnosis not present

## 2021-04-06 DIAGNOSIS — R441 Visual hallucinations: Secondary | ICD-10-CM | POA: Diagnosis not present

## 2021-04-06 DIAGNOSIS — Q796 Ehlers-Danlos syndrome, unspecified: Secondary | ICD-10-CM | POA: Diagnosis not present

## 2021-04-06 DIAGNOSIS — F23 Brief psychotic disorder: Secondary | ICD-10-CM | POA: Diagnosis not present

## 2021-04-06 DIAGNOSIS — Z20822 Contact with and (suspected) exposure to covid-19: Secondary | ICD-10-CM | POA: Diagnosis not present

## 2021-04-06 NOTE — Telephone Encounter (Signed)
Who's calling (name and relationship to patient) : Auroara Hutsell   Best contact number: [phone]  Provider they see: Dr. Gaynell Face  Reason for call: Mom states that patient has within the last three days developed intense paranioa, flashing lights and hearing voices. Claims her brother has planted listening devices in her room and secret cameras. Mom is on the way to [place]. Mom would like to talk with Dr. Gaynell Face ASAP   Call ID:      [place]  Name of prescription:  Pharmacy:

## 2021-04-06 NOTE — Telephone Encounter (Signed)
Called mom, left HIPPA approved vm.

## 2021-04-06 NOTE — Telephone Encounter (Signed)
  Who's calling (name and relationship to patient) : Ammie Ferrier (Mother) Best contact number: [phone] (Mobile) Provider they see:  Jodi Geralds, MD Reason for call: Please have dr Gaynell Face contact patient concerning hallucinations, Mom is very concerned patient also visited er on yesterday 08/22  PRESCRIPTION REFILL ONLY  Name of prescription:  Pharmacy:

## 2021-04-06 NOTE — Telephone Encounter (Signed)
I left a message on mother's voicemail at [place] PM.  I told her to call back tomorrow and let me know when I might reach her and I will call.

## 2021-04-07 ENCOUNTER — Telehealth: Payer: Self-pay | Admitting: Family Medicine

## 2021-04-07 DIAGNOSIS — F29 Unspecified psychosis not due to a substance or known physiological condition: Secondary | ICD-10-CM | POA: Diagnosis not present

## 2021-04-07 DIAGNOSIS — R9401 Abnormal electroencephalogram [EEG]: Secondary | ICD-10-CM | POA: Diagnosis not present

## 2021-04-07 DIAGNOSIS — R441 Visual hallucinations: Secondary | ICD-10-CM | POA: Diagnosis not present

## 2021-04-07 DIAGNOSIS — R440 Auditory hallucinations: Secondary | ICD-10-CM | POA: Diagnosis not present

## 2021-04-07 DIAGNOSIS — Z8669 Personal history of other diseases of the nervous system and sense organs: Secondary | ICD-10-CM | POA: Diagnosis not present

## 2021-04-07 DIAGNOSIS — R569 Unspecified convulsions: Secondary | ICD-10-CM | POA: Diagnosis not present

## 2021-04-07 DIAGNOSIS — Z0389 Encounter for observation for other suspected diseases and conditions ruled out: Secondary | ICD-10-CM | POA: Diagnosis not present

## 2021-04-07 NOTE — Telephone Encounter (Signed)
I spoke with mother.  Karen [name] was turned down at [facility] because she is wheelchair-bound.  She is now in the emergency department at [facility] under observation.  It appears from her mother's description that she is having paranoid delusions and auditory and visual hallucinations.  I do not know why this is the case.  It would be easy to blame this on her polypharmacy but she has been on these medications for a long period of time and nothing has been changed.  She apparently was turned down by Dr. Metta Clines for long-term care.  My office was not informed.  I am not certain what we can do to get her placed to deal with her headaches and tics.  Dr. Lelon Huh has provided care for her chronic pain syndrome and all her other medical issues for years.  Perhaps he could persuade his neurologic colleagues in [place] to assist him with the neurologic aspects of her care.  The most important thing at this time is to deal with her psychosis.  I asked mother to keep me informed.  I do not have a long-term plan for transition of care.

## 2021-04-07 NOTE — Telephone Encounter (Signed)
Mom wants Dr Caryn Section to know that pt is in Emergency Dept Psych Dept at [facility]. Mom [name] says Dr Caryn Section can call her at home if you want.

## 2021-04-08 DIAGNOSIS — Z20822 Contact with and (suspected) exposure to covid-19: Secondary | ICD-10-CM | POA: Diagnosis present

## 2021-04-08 DIAGNOSIS — F29 Unspecified psychosis not due to a substance or known physiological condition: Secondary | ICD-10-CM | POA: Diagnosis not present

## 2021-04-08 DIAGNOSIS — F323 Major depressive disorder, single episode, severe with psychotic features: Secondary | ICD-10-CM | POA: Diagnosis present

## 2021-04-08 DIAGNOSIS — K219 Gastro-esophageal reflux disease without esophagitis: Secondary | ICD-10-CM | POA: Diagnosis present

## 2021-04-08 DIAGNOSIS — T43595A Adverse effect of other antipsychotics and neuroleptics, initial encounter: Secondary | ICD-10-CM | POA: Diagnosis present

## 2021-04-08 DIAGNOSIS — E876 Hypokalemia: Secondary | ICD-10-CM | POA: Diagnosis present

## 2021-04-08 DIAGNOSIS — R339 Retention of urine, unspecified: Secondary | ICD-10-CM | POA: Diagnosis present

## 2021-04-08 DIAGNOSIS — F439 Reaction to severe stress, unspecified: Secondary | ICD-10-CM | POA: Diagnosis present

## 2021-04-08 DIAGNOSIS — R454 Irritability and anger: Secondary | ICD-10-CM | POA: Diagnosis not present

## 2021-04-08 DIAGNOSIS — T40415A Adverse effect of fentanyl or fentanyl analogs, initial encounter: Secondary | ICD-10-CM | POA: Diagnosis present

## 2021-04-08 DIAGNOSIS — G40909 Epilepsy, unspecified, not intractable, without status epilepticus: Secondary | ICD-10-CM | POA: Diagnosis present

## 2021-04-08 DIAGNOSIS — F32A Depression, unspecified: Secondary | ICD-10-CM | POA: Diagnosis not present

## 2021-04-08 DIAGNOSIS — F419 Anxiety disorder, unspecified: Secondary | ICD-10-CM | POA: Diagnosis present

## 2021-04-08 DIAGNOSIS — R451 Restlessness and agitation: Secondary | ICD-10-CM | POA: Diagnosis not present

## 2021-04-08 DIAGNOSIS — R109 Unspecified abdominal pain: Secondary | ICD-10-CM | POA: Diagnosis present

## 2021-04-08 DIAGNOSIS — F5105 Insomnia due to other mental disorder: Secondary | ICD-10-CM | POA: Diagnosis present

## 2021-04-08 DIAGNOSIS — M62838 Other muscle spasm: Secondary | ICD-10-CM | POA: Diagnosis present

## 2021-04-08 DIAGNOSIS — R110 Nausea: Secondary | ICD-10-CM | POA: Diagnosis present

## 2021-04-08 DIAGNOSIS — F22 Delusional disorders: Secondary | ICD-10-CM | POA: Diagnosis not present

## 2021-04-08 DIAGNOSIS — Q796 Ehlers-Danlos syndrome, unspecified: Secondary | ICD-10-CM | POA: Diagnosis not present

## 2021-04-08 DIAGNOSIS — M797 Fibromyalgia: Secondary | ICD-10-CM | POA: Diagnosis present

## 2021-04-08 DIAGNOSIS — G4731 Primary central sleep apnea: Secondary | ICD-10-CM | POA: Diagnosis present

## 2021-04-08 DIAGNOSIS — G4733 Obstructive sleep apnea (adult) (pediatric): Secondary | ICD-10-CM | POA: Diagnosis present

## 2021-04-08 DIAGNOSIS — F959 Tic disorder, unspecified: Secondary | ICD-10-CM | POA: Diagnosis not present

## 2021-04-08 DIAGNOSIS — R443 Hallucinations, unspecified: Secondary | ICD-10-CM | POA: Diagnosis not present

## 2021-04-08 DIAGNOSIS — T43215A Adverse effect of selective serotonin and norepinephrine reuptake inhibitors, initial encounter: Secondary | ICD-10-CM | POA: Diagnosis present

## 2021-04-08 DIAGNOSIS — G4700 Insomnia, unspecified: Secondary | ICD-10-CM | POA: Diagnosis not present

## 2021-04-08 DIAGNOSIS — K259 Gastric ulcer, unspecified as acute or chronic, without hemorrhage or perforation: Secondary | ICD-10-CM | POA: Diagnosis present

## 2021-04-08 DIAGNOSIS — T434X5A Adverse effect of butyrophenone and thiothixene neuroleptics, initial encounter: Secondary | ICD-10-CM | POA: Diagnosis present

## 2021-04-08 DIAGNOSIS — F23 Brief psychotic disorder: Secondary | ICD-10-CM | POA: Diagnosis present

## 2021-04-08 DIAGNOSIS — J309 Allergic rhinitis, unspecified: Secondary | ICD-10-CM | POA: Diagnosis present

## 2021-04-08 DIAGNOSIS — G8929 Other chronic pain: Secondary | ICD-10-CM | POA: Diagnosis present

## 2021-04-10 ENCOUNTER — Other Ambulatory Visit: Payer: Self-pay | Admitting: Family Medicine

## 2021-04-10 NOTE — Telephone Encounter (Signed)
Requested Prescriptions  Pending Prescriptions Disp Refills  . tiZANidine (ZANAFLEX) 4 MG tablet [Pharmacy Med Name: TIZANIDINE HCL 4 MG TABLET] 270 tablet     Sig: TAKE 0.5-1 TABLETS (2-4 MG TOTAL) BY MOUTH EVERY 8 (EIGHT) HOURS AS NEEDED FOR MUSCLE SPASMS.     Not Delegated - Cardiovascular:  Alpha-2 Agonists - tizanidine Failed - 04/10/2021  1:05 PM      Failed - This refill cannot be delegated      Passed - Valid encounter within last 6 months    Recent Outpatient Visits          2 weeks ago [place], Donald E, MD   4 months ago Ehlers-Danlos syndrome   [facility] Birdie Sons, MD   1 year ago Sinusitis, unspecified chronicity, unspecified location   [facility] Birdie Sons, MD   1 year ago Rheumatoid arthritis, involving unspecified site, unspecified whether rheumatoid factor present [facility])   [facility] Birdie Sons, MD   1 year ago Breast lump on left side at 1 o'clock position   [facility] Flinchum, Kelby Aline, FNP      Future Appointments            In 68 months MacDiarmid, Nicki Reaper, MD [place]           . omeprazole (PRILOSEC) 40 MG capsule [Pharmacy Med Name: OMEPRAZOLE DR 40 MG CAPSULE] 30 capsule 1    Sig: TAKE 1 CAPSULE (40 MG TOTAL) BY MOUTH DAILY.     Gastroenterology: Proton Pump Inhibitors Passed - 04/10/2021  1:05 PM      Passed - Valid encounter within last 12 months    Recent Outpatient Visits          2 weeks ago [place], Donald E, MD   4 months ago Ehlers-Danlos syndrome   [facility] Birdie Sons, MD   1 year ago Sinusitis, unspecified chronicity, unspecified location   [facility] Birdie Sons, MD   1 year ago Rheumatoid arthritis, involving unspecified site, unspecified whether rheumatoid factor present [facility])   [facility] Birdie Sons, MD   1 year ago Breast lump on left side at 1 o'clock position   [place], Kelby Aline, FNP      Future Appointments            In 26 months MacDiarmid, Nicki Reaper, [place]

## 2021-04-10 NOTE — Telephone Encounter (Signed)
Requested medication (s) are due for refill today: no  Requested medication (s) are on the active medication list: yes  Last refill:  02/17/21 #90 4 RF  Future visit scheduled: no  Notes to clinic:  NT not delegated to RF or refuse this med   Requested Prescriptions  Pending Prescriptions Disp Refills   tiZANidine (ZANAFLEX) 4 MG tablet [Pharmacy Med Name: TIZANIDINE HCL 4 MG TABLET] 270 tablet     Sig: TAKE 0.5-1 TABLETS (2-4 MG TOTAL) BY MOUTH EVERY 8 (EIGHT) HOURS AS NEEDED FOR MUSCLE SPASMS.     Not Delegated - Cardiovascular:  Alpha-2 Agonists - tizanidine Failed - 04/10/2021  1:05 PM      Failed - This refill cannot be delegated      Passed - Valid encounter within last 6 months    Recent Outpatient Visits           2 weeks ago [place], Donald E, MD   4 months ago Ehlers-Danlos syndrome   [facility] Birdie Sons, MD   1 year ago Sinusitis, unspecified chronicity, unspecified location   [facility] Birdie Sons, MD   1 year ago Rheumatoid arthritis, involving unspecified site, unspecified whether rheumatoid factor present [facility])   [facility] Birdie Sons, MD   1 year ago Breast lump on left side at 1 o'clock position   [place], Kelby Aline, FNP       Future Appointments             In 10 months MacDiarmid, Nicki Reaper, MD [facility]            Signed Prescriptions Disp Refills   omeprazole (PRILOSEC) 40 MG capsule 30 capsule 1    Sig: TAKE 1 CAPSULE (40 MG TOTAL) BY MOUTH DAILY.     Gastroenterology: Proton Pump Inhibitors Passed - 04/10/2021  1:05 PM      Passed - Valid encounter within last 12 months    Recent Outpatient Visits           2 weeks ago [place], Donald E, MD   4 months ago Ehlers-Danlos syndrome   [facility] Birdie Sons, MD   1 year ago  Sinusitis, unspecified chronicity, unspecified location   [facility] Birdie Sons, MD   1 year ago Rheumatoid arthritis, involving unspecified site, unspecified whether rheumatoid factor present [facility])   [facility] Birdie Sons, MD   1 year ago Breast lump on left side at 1 o'clock position   [place], Kelby Aline, FNP       Future Appointments             In 61 months MacDiarmid, Nicki Reaper, [place]

## 2021-04-12 NOTE — Telephone Encounter (Signed)
LOV: 03/26/2021 NOV: None  Last refill:  02/17/2021   #90 4 Refills

## 2021-04-20 ENCOUNTER — Telehealth (INDEPENDENT_AMBULATORY_CARE_PROVIDER_SITE_OTHER): Payer: Self-pay | Admitting: Pediatrics

## 2021-04-20 NOTE — Telephone Encounter (Signed)
  Who's calling (name and relationship to patient) :[name] (Self)  Best contact number: [phone] (Mobile) Provider they see: Jodi Geralds, MD Reason for call:  Please contact patient to follow up on uncontrollable  ticks and her being unable to keep a smile.   PRESCRIPTION REFILL ONLY  Name of prescription:  Pharmacy:

## 2021-04-21 ENCOUNTER — Telehealth: Payer: Self-pay | Admitting: Gastroenterology

## 2021-04-21 ENCOUNTER — Telehealth: Payer: Self-pay

## 2021-04-21 NOTE — Telephone Encounter (Signed)
Called patient no answer left voicemail for a cal back

## 2021-04-21 NOTE — Telephone Encounter (Signed)
Pt. Mother calling to make appt for colonoscopy

## 2021-04-21 NOTE — Telephone Encounter (Signed)
I left a message for the patient to call back.  I told her that I would be out of the office after 4:45 PM and not return until April 28, 2021.

## 2021-04-22 ENCOUNTER — Emergency Department: Payer: Medicare Other

## 2021-04-22 ENCOUNTER — Inpatient Hospital Stay
Admission: EM | Admit: 2021-04-22 | Discharge: 2021-04-24 | DRG: 689 | Disposition: A | Discharge status: Home / Self Care | Payer: Medicare Other | Attending: Internal Medicine | Admitting: Internal Medicine

## 2021-04-22 ENCOUNTER — Other Ambulatory Visit: Payer: Self-pay

## 2021-04-22 DIAGNOSIS — Q796 Ehlers-Danlos syndrome, unspecified: Secondary | ICD-10-CM | POA: Diagnosis not present

## 2021-04-22 DIAGNOSIS — F418 Other specified anxiety disorders: Secondary | ICD-10-CM

## 2021-04-22 DIAGNOSIS — F419 Anxiety disorder, unspecified: Secondary | ICD-10-CM | POA: Diagnosis present

## 2021-04-22 DIAGNOSIS — R339 Retention of urine, unspecified: Secondary | ICD-10-CM | POA: Diagnosis not present

## 2021-04-22 DIAGNOSIS — J45909 Unspecified asthma, uncomplicated: Secondary | ICD-10-CM | POA: Diagnosis present

## 2021-04-22 DIAGNOSIS — G894 Chronic pain syndrome: Secondary | ICD-10-CM | POA: Diagnosis present

## 2021-04-22 DIAGNOSIS — N3090 Cystitis, unspecified without hematuria: Secondary | ICD-10-CM | POA: Diagnosis not present

## 2021-04-22 DIAGNOSIS — G40909 Epilepsy, unspecified, not intractable, without status epilepticus: Secondary | ICD-10-CM | POA: Diagnosis present

## 2021-04-22 DIAGNOSIS — R569 Unspecified convulsions: Secondary | ICD-10-CM

## 2021-04-22 DIAGNOSIS — G2569 Other tics of organic origin: Secondary | ICD-10-CM

## 2021-04-22 DIAGNOSIS — Z881 Allergy status to other antibiotic agents status: Secondary | ICD-10-CM | POA: Diagnosis not present

## 2021-04-22 DIAGNOSIS — N390 Urinary tract infection, site not specified: Secondary | ICD-10-CM | POA: Diagnosis present

## 2021-04-22 DIAGNOSIS — R4182 Altered mental status, unspecified: Secondary | ICD-10-CM

## 2021-04-22 DIAGNOSIS — J4520 Mild intermittent asthma, uncomplicated: Secondary | ICD-10-CM

## 2021-04-22 DIAGNOSIS — G4733 Obstructive sleep apnea (adult) (pediatric): Secondary | ICD-10-CM | POA: Diagnosis present

## 2021-04-22 DIAGNOSIS — B9620 Unspecified Escherichia coli [E. coli] as the cause of diseases classified elsewhere: Secondary | ICD-10-CM | POA: Diagnosis present

## 2021-04-22 DIAGNOSIS — A419 Sepsis, unspecified organism: Secondary | ICD-10-CM | POA: Diagnosis not present

## 2021-04-22 DIAGNOSIS — F32A Depression, unspecified: Secondary | ICD-10-CM | POA: Diagnosis present

## 2021-04-22 DIAGNOSIS — F061 Catatonic disorder due to known physiological condition: Secondary | ICD-10-CM | POA: Diagnosis present

## 2021-04-22 DIAGNOSIS — Z886 Allergy status to analgesic agent status: Secondary | ICD-10-CM | POA: Diagnosis not present

## 2021-04-22 DIAGNOSIS — Z91018 Allergy to other foods: Secondary | ICD-10-CM

## 2021-04-22 DIAGNOSIS — R4020 Unspecified coma: Secondary | ICD-10-CM | POA: Diagnosis not present

## 2021-04-22 DIAGNOSIS — M797 Fibromyalgia: Secondary | ICD-10-CM | POA: Diagnosis present

## 2021-04-22 DIAGNOSIS — R000 Tachycardia, unspecified: Secondary | ICD-10-CM | POA: Diagnosis not present

## 2021-04-22 DIAGNOSIS — Z825 Family history of asthma and other chronic lower respiratory diseases: Secondary | ICD-10-CM

## 2021-04-22 DIAGNOSIS — G43909 Migraine, unspecified, not intractable, without status migrainosus: Secondary | ICD-10-CM | POA: Diagnosis present

## 2021-04-22 DIAGNOSIS — I10 Essential (primary) hypertension: Secondary | ICD-10-CM | POA: Diagnosis not present

## 2021-04-22 DIAGNOSIS — Z818 Family history of other mental and behavioral disorders: Secondary | ICD-10-CM | POA: Diagnosis not present

## 2021-04-22 DIAGNOSIS — Z20822 Contact with and (suspected) exposure to covid-19: Secondary | ICD-10-CM | POA: Diagnosis present

## 2021-04-22 DIAGNOSIS — Z87820 Personal history of traumatic brain injury: Secondary | ICD-10-CM | POA: Diagnosis not present

## 2021-04-22 DIAGNOSIS — Z885 Allergy status to narcotic agent status: Secondary | ICD-10-CM | POA: Diagnosis not present

## 2021-04-22 DIAGNOSIS — M069 Rheumatoid arthritis, unspecified: Secondary | ICD-10-CM | POA: Diagnosis present

## 2021-04-22 DIAGNOSIS — G9341 Metabolic encephalopathy: Secondary | ICD-10-CM | POA: Diagnosis present

## 2021-04-22 DIAGNOSIS — F22 Delusional disorders: Secondary | ICD-10-CM | POA: Diagnosis present

## 2021-04-22 DIAGNOSIS — Z7951 Long term (current) use of inhaled steroids: Secondary | ICD-10-CM

## 2021-04-22 DIAGNOSIS — I700 Atherosclerosis of aorta: Secondary | ICD-10-CM | POA: Diagnosis not present

## 2021-04-22 DIAGNOSIS — N3000 Acute cystitis without hematuria: Secondary | ICD-10-CM | POA: Diagnosis not present

## 2021-04-22 DIAGNOSIS — F29 Unspecified psychosis not due to a substance or known physiological condition: Secondary | ICD-10-CM | POA: Diagnosis present

## 2021-04-22 DIAGNOSIS — Z791 Long term (current) use of non-steroidal anti-inflammatories (NSAID): Secondary | ICD-10-CM

## 2021-04-22 DIAGNOSIS — R404 Transient alteration of awareness: Secondary | ICD-10-CM | POA: Diagnosis not present

## 2021-04-22 DIAGNOSIS — Z993 Dependence on wheelchair: Secondary | ICD-10-CM | POA: Diagnosis not present

## 2021-04-22 DIAGNOSIS — Z79899 Other long term (current) drug therapy: Secondary | ICD-10-CM

## 2021-04-22 DIAGNOSIS — N3289 Other specified disorders of bladder: Secondary | ICD-10-CM | POA: Diagnosis not present

## 2021-04-22 DIAGNOSIS — K5641 Fecal impaction: Secondary | ICD-10-CM | POA: Diagnosis not present

## 2021-04-22 DIAGNOSIS — Z880 Allergy status to penicillin: Secondary | ICD-10-CM

## 2021-04-22 LAB — URINE DRUG SCREEN, QUALITATIVE (ARMC ONLY)
Amphetamines, Ur Screen: POSITIVE — AB
Barbiturates, Ur Screen: NOT DETECTED
Benzodiazepine, Ur Scrn: NOT DETECTED
Cannabinoid 50 Ng, Ur ~~LOC~~: NOT DETECTED
Cocaine Metabolite,Ur ~~LOC~~: NOT DETECTED
MDMA (Ecstasy)Ur Screen: NOT DETECTED
Methadone Scn, Ur: NOT DETECTED
Opiate, Ur Screen: NOT DETECTED
Phencyclidine (PCP) Ur S: NOT DETECTED
Tricyclic, Ur Screen: POSITIVE — AB

## 2021-04-22 LAB — CBC WITH DIFFERENTIAL/PLATELET
Abs Immature Granulocytes: 0.06 10*3/uL (ref 0.00–0.07)
Basophils Absolute: 0 10*3/uL (ref 0.0–0.1)
Basophils Relative: 0 %
Eosinophils Absolute: 0 10*3/uL (ref 0.0–0.5)
Eosinophils Relative: 0 %
HCT: 44.2 % (ref 36.0–46.0)
Hemoglobin: 15.3 g/dL — ABNORMAL HIGH (ref 12.0–15.0)
Immature Granulocytes: 0 %
Lymphocytes Relative: 7 %
Lymphs Abs: 1.1 10*3/uL (ref 0.7–4.0)
MCH: 29.3 pg (ref 26.0–34.0)
MCHC: 34.6 g/dL (ref 30.0–36.0)
MCV: 84.5 fL (ref 80.0–100.0)
Monocytes Absolute: 0.9 10*3/uL (ref 0.1–1.0)
Monocytes Relative: 6 %
Neutro Abs: 14.3 10*3/uL — ABNORMAL HIGH (ref 1.7–7.7)
Neutrophils Relative %: 87 %
Platelets: 311 10*3/uL (ref 150–400)
RBC: 5.23 MIL/uL — ABNORMAL HIGH (ref 3.87–5.11)
RDW: 14 % (ref 11.5–15.5)
WBC: 16.5 10*3/uL — ABNORMAL HIGH (ref 4.0–10.5)
nRBC: 0 % (ref 0.0–0.2)

## 2021-04-22 LAB — COMPREHENSIVE METABOLIC PANEL
ALT: 12 U/L (ref 0–44)
AST: 18 U/L (ref 15–41)
Albumin: 4.1 g/dL (ref 3.5–5.0)
Alkaline Phosphatase: 75 U/L (ref 38–126)
Anion gap: 11 (ref 5–15)
BUN: 12 mg/dL (ref 6–20)
CO2: 23 mmol/L (ref 22–32)
Calcium: 9.3 mg/dL (ref 8.9–10.3)
Chloride: 104 mmol/L (ref 98–111)
Creatinine, Ser: 0.64 mg/dL (ref 0.44–1.00)
GFR, Estimated: >60 mL/min (ref >60)
Glucose, Bld: 141 mg/dL — ABNORMAL HIGH (ref 70–99)
Potassium: 3.7 mmol/L (ref 3.5–5.1)
Sodium: 138 mmol/L (ref 135–145)
Total Bilirubin: 0.7 mg/dL (ref 0.3–1.2)
Total Protein: 7.7 g/dL (ref 6.5–8.1)

## 2021-04-22 LAB — URINALYSIS, COMPLETE (UACMP) WITH MICROSCOPIC
Bilirubin Urine: NEGATIVE
Glucose, UA: NEGATIVE mg/dL
Hgb urine dipstick: NEGATIVE
Ketones, ur: NEGATIVE mg/dL
Nitrite: NEGATIVE
Protein, ur: NEGATIVE mg/dL
Specific Gravity, Urine: 1.015 (ref 1.005–1.030)
pH: 7 (ref 5.0–8.0)

## 2021-04-22 LAB — PREGNANCY, URINE: Preg Test, Ur: NEGATIVE

## 2021-04-22 LAB — ACETAMINOPHEN LEVEL: Acetaminophen (Tylenol), Serum: <10 ug/mL — ABNORMAL LOW (ref 10–30)

## 2021-04-22 LAB — SALICYLATE LEVEL: Salicylate Lvl: <7 mg/dL — ABNORMAL LOW (ref 7.0–30.0)

## 2021-04-22 LAB — ETHANOL: Alcohol, Ethyl (B): <10 mg/dL (ref <10)

## 2021-04-22 LAB — CBG MONITORING, ED: Glucose-Capillary: 141 mg/dL — ABNORMAL HIGH (ref 70–99)

## 2021-04-22 LAB — RESP PANEL BY RT-PCR (FLU A&B, COVID) ARPGX2
Influenza A by PCR: NEGATIVE
Influenza B by PCR: NEGATIVE
SARS Coronavirus 2 by RT PCR: NEGATIVE

## 2021-04-22 LAB — LACTIC ACID, PLASMA: Lactic Acid, Venous: 1.6 mmol/L (ref 0.5–1.9)

## 2021-04-22 LAB — PROCALCITONIN: Procalcitonin: <0.1 ng/mL

## 2021-04-22 LAB — TROPONIN I (HIGH SENSITIVITY)
Troponin I (High Sensitivity): 5 ng/L (ref <18)
Troponin I (High Sensitivity): 2 ng/L (ref <18)

## 2021-04-22 LAB — AMMONIA: Ammonia: 22 umol/L (ref 9–35)

## 2021-04-22 MED ORDER — QUETIAPINE FUMARATE 25 MG PO TABS
200.0000 mg | ORAL_TABLET | Freq: Every day | ORAL | Status: DC
  Administered 2021-04-22 – 2021-04-23 (×2): 200 mg via ORAL
  Filled 2021-04-22 (×2): qty 8

## 2021-04-22 MED ORDER — BUSPIRONE HCL 15 MG PO TABS
15.0000 mg | ORAL_TABLET | Freq: Two times a day (BID) | ORAL | Status: DC
  Administered 2021-04-22 – 2021-04-24 (×4): 15 mg via ORAL
  Filled 2021-04-22 (×5): qty 1

## 2021-04-22 MED ORDER — LORAZEPAM 2 MG/ML IJ SOLN: 2.0000 mg | Freq: Once | INTRAMUSCULAR | Status: DC

## 2021-04-22 MED ORDER — MOMETASONE FURO-FORMOTEROL FUM 200-5 MCG/ACT IN AERO
2.0000 | INHALATION_SPRAY | Freq: Two times a day (BID) | RESPIRATORY_TRACT | Status: DC
  Administered 2021-04-23 – 2021-04-24 (×3): 2 via RESPIRATORY_TRACT
  Filled 2021-04-22: qty 8.8

## 2021-04-22 MED ORDER — BIOTENE DRY MOUTH MT LIQD: 15.0000 mL | OROMUCOSAL | Status: DC | PRN

## 2021-04-22 MED ORDER — LORAZEPAM 2 MG/ML IJ SOLN
2.0000 mg | Freq: Once | INTRAMUSCULAR | Status: AC
  Administered 2021-04-22: 2 mg via INTRAMUSCULAR
  Filled 2021-04-22: qty 1

## 2021-04-22 MED ORDER — PANTOPRAZOLE SODIUM 40 MG PO TBEC
40.0000 mg | DELAYED_RELEASE_TABLET | Freq: Every day | ORAL | Status: DC
  Administered 2021-04-22 – 2021-04-24 (×3): 40 mg via ORAL
  Filled 2021-04-22 (×2): qty 1

## 2021-04-22 MED ORDER — SODIUM CHLORIDE 0.9 % IV BOLUS: 500.0000 mL | Freq: Once | INTRAVENOUS | Status: DC

## 2021-04-22 MED ORDER — ENOXAPARIN SODIUM 40 MG/0.4ML IJ SOSY: 40.0000 mg | PREFILLED_SYRINGE | INTRAMUSCULAR | Status: DC

## 2021-04-22 MED ORDER — SODIUM CHLORIDE 0.9 % IV BOLUS
1000.0000 mL | Freq: Once | INTRAVENOUS | Status: AC
  Administered 2021-04-22: 1000 mL via INTRAVENOUS

## 2021-04-22 MED ORDER — DICLOFENAC SODIUM 1 % EX GEL
2.0000 g | Freq: Three times a day (TID) | CUTANEOUS | Status: DC
  Administered 2021-04-22 – 2021-04-24 (×4): 2 g via TOPICAL
  Filled 2021-04-22: qty 100

## 2021-04-22 MED ORDER — KETOTIFEN FUMARATE 0.025 % OP SOLN
1.0000 [drp] | Freq: Two times a day (BID) | OPHTHALMIC | Status: DC
  Administered 2021-04-23 (×2): 1 [drp] via OPHTHALMIC
  Filled 2021-04-22: qty 5

## 2021-04-22 MED ORDER — BENZTROPINE MESYLATE 1 MG/ML IJ SOLN
2.0000 mg | Freq: Once | INTRAMUSCULAR | Status: AC
  Administered 2021-04-22: 2 mg via INTRAMUSCULAR
  Filled 2021-04-22: qty 2

## 2021-04-22 MED ORDER — LORAZEPAM 2 MG/ML IJ SOLN: 1.0000 mg | INTRAMUSCULAR | Status: DC | PRN

## 2021-04-22 MED ORDER — FLUTICASONE PROPIONATE 50 MCG/ACT NA SUSP
1.0000 | Freq: Every day | NASAL | Status: DC
  Administered 2021-04-23: 1 via NASAL
  Filled 2021-04-22: qty 16

## 2021-04-22 MED ORDER — CELECOXIB 200 MG PO CAPS
200.0000 mg | ORAL_CAPSULE | Freq: Two times a day (BID) | ORAL | Status: DC
  Administered 2021-04-22 – 2021-04-24 (×4): 200 mg via ORAL
  Filled 2021-04-22 (×5): qty 1

## 2021-04-22 MED ORDER — SODIUM CHLORIDE 0.9 % IV SOLN
2.0000 g | Freq: Once | INTRAVENOUS | Status: AC
  Administered 2021-04-22: 2 g via INTRAVENOUS
  Filled 2021-04-22: qty 20

## 2021-04-22 MED ORDER — BUSPIRONE HCL 10 MG PO TABS
10.0000 mg | ORAL_TABLET | Freq: Two times a day (BID) | ORAL | Status: DC
  Administered 2021-04-22 – 2021-04-24 (×4): 10 mg via ORAL
  Filled 2021-04-22 (×5): qty 1

## 2021-04-22 MED ORDER — ALBUTEROL SULFATE (2.5 MG/3ML) 0.083% IN NEBU: 2.5000 mg | INHALATION_SOLUTION | RESPIRATORY_TRACT | Status: DC | PRN

## 2021-04-22 MED ORDER — SODIUM CHLORIDE 0.9 % IV SOLN
1.0000 g | INTRAVENOUS | Status: DC
  Administered 2021-04-23 – 2021-04-24 (×2): 1 g via INTRAVENOUS
  Filled 2021-04-22 (×2): qty 1

## 2021-04-22 MED ORDER — LORATADINE 10 MG PO TABS
10.0000 mg | ORAL_TABLET | Freq: Every day | ORAL | Status: DC
  Administered 2021-04-22 – 2021-04-24 (×3): 10 mg via ORAL
  Filled 2021-04-22 (×3): qty 1

## 2021-04-22 MED ORDER — PREGABALIN 75 MG PO CAPS
300.0000 mg | ORAL_CAPSULE | Freq: Two times a day (BID) | ORAL | Status: DC
  Administered 2021-04-22 – 2021-04-24 (×4): 300 mg via ORAL
  Filled 2021-04-22 (×4): qty 4

## 2021-04-22 MED ORDER — GABAPENTIN 600 MG PO TABS
1200.0000 mg | ORAL_TABLET | Freq: Every day | ORAL | Status: DC
  Administered 2021-04-23 – 2021-04-24 (×2): 1200 mg via ORAL
  Filled 2021-04-22 (×2): qty 2

## 2021-04-22 MED ORDER — CYCLOBENZAPRINE HCL 10 MG PO TABS: 5.0000 mg | ORAL_TABLET | Freq: Three times a day (TID) | ORAL | Status: DC | PRN

## 2021-04-22 MED ORDER — FENTANYL 100 MCG/HR TD PT72
1.0000 | MEDICATED_PATCH | TRANSDERMAL | Status: DC
  Administered 2021-04-22: 20:00:00 1 via TRANSDERMAL
  Filled 2021-04-22: qty 1

## 2021-04-22 MED ORDER — GABAPENTIN 600 MG PO TABS
1800.0000 mg | ORAL_TABLET | Freq: Every day | ORAL | Status: DC
  Administered 2021-04-22 – 2021-04-23 (×2): 1800 mg via ORAL
  Filled 2021-04-22 (×2): qty 3

## 2021-04-22 MED ORDER — DULOXETINE HCL 30 MG PO CPEP
120.0000 mg | ORAL_CAPSULE | Freq: Every day | ORAL | Status: DC
  Administered 2021-04-23 – 2021-04-24 (×2): 120 mg via ORAL
  Filled 2021-04-22 (×2): qty 4

## 2021-04-22 MED ORDER — TAMSULOSIN HCL 0.4 MG PO CAPS
0.4000 mg | ORAL_CAPSULE | Freq: Every day | ORAL | Status: DC
  Administered 2021-04-23 – 2021-04-24 (×2): 0.4 mg via ORAL
  Filled 2021-04-22 (×2): qty 1

## 2021-04-22 MED ORDER — BENZTROPINE MESYLATE 1 MG/ML IJ SOLN
2.0000 mg | Freq: Once | INTRAMUSCULAR | Status: DC
  Filled 2021-04-22: qty 2

## 2021-04-22 NOTE — ED Notes (Signed)
Georgina Peer RN aware of assigned bed

## 2021-04-22 NOTE — Consult Note (Signed)
[facility] Consult   Reason for Consult:  Consult for altered mentation.  Referring Physician:  Joni Fears Patient Identification: Karen [name] MRN:  [id number] Principal Diagnosis: Acute metabolic encephalopathy Diagnosis:  Principal Problem:   Acute metabolic encephalopathy Active Problems:   Tics of organic origin   Depression with anxiety   Seizures ([place])   Asthma   Paranoid ([place])   Psychosis ([place])   UTI (urinary tract infection)   Sepsis ([place])   Urinary retention   Total Time spent with patient: 20 minutes  Subjective: Karen [name] is a 45 y.o. female admitted with "I don't feel good. I don't want to talk."     HPI:  Patient is a 45 year old female with above diagnoses who  was brought to the ED via EMS due to altered mental mentation and catatonia. She was recently hospitalized at [facility] psychotic unit from 8/23-04/15/2021 due to a new onset of hallucinations and paranoia. At time of that admission there was concern for symptoms driven by polypharmacy. According to chart review, patient returned close to baseline at time of discharge from [facility]. Patient was seen by this Probation officer and interview attempted. Patient said "no" when asked for permission for interview and would not engage.. A second attempt was made later by writer and Dr. [name], together.  During this time, she answered that she was at [facility] in response to asking if she knew where she was.  She responded that she came by ambulance, but was unable to articulate any other details. She is able to respond that her appetite has been diminished. Patient remained silent for any other questions. Unable to fully assess PSE.  Dr. [name] explained to patient that we understand she is feeling ill, has a urinary tract infection, and will be admitted to the hospital as an inpatient. Advised patient that we will continue to check in on her.   Past Psychiatric History: History of depression. Hospitalized at [facility] psychiatry  8/23-04/15/21.   Risk to Self:   Risk to Others:   Prior Inpatient Therapy:   Prior Outpatient Therapy:    Past Medical History:  Past Medical History:  Diagnosis Date   Ehlers-Danlos syndrome    Fibromyalgia    Headache    Sleep apnea    Tics of organic origin    Transient alteration of awareness     Past Surgical History:  Procedure Laterality Date   COLONOSCOPY WITH PROPOFOL N/A 02/13/2018   Procedure: COLONOSCOPY WITH PROPOFOL;  Surgeon: [name], MD;  Location: [facility] ENDOSCOPY;  Service: Endoscopy;  Laterality: N/A;   DILATION AND CURETTAGE OF UTERUS  2009   ESOPHAGOGASTRODUODENOSCOPY (EGD) WITH PROPOFOL  02/13/2018   Procedure: ESOPHAGOGASTRODUODENOSCOPY (EGD) WITH PROPOFOL;  Surgeon: [name], MD;  Location: [facility] ENDOSCOPY;  Service: Endoscopy;;   EYE SURGERY Left 1990   3 Surgeries on left eye to correct crossed eye   LAPAROSCOPY Left 2003   Fallopian Tube   [place]   3 Surgeries to repair broken arm   OTHER SURGICAL HISTORY Left    3 surgeries on her left thigh as an infant   OTHER SURGICAL HISTORY  1998   Jaw surgery   Right arm surgery  1987   x 3 due to fracture   TOENAIL EXCISION Bilateral 06/13/2019   Procedure: BILATERAL SECOND TOE PARTIAL NAIL ABLATION;  Surgeon: Erle Crocker, MD;  Location: [facility];  Service: Orthopedics;  Laterality: Bilateral;  SURGERY REQUEST TIME 1 HOUR   TONSILLECTOMY  12/13/2002   Family History:  Family History  Problem Relation Age of Onset   Depression Mother    Stroke Mother    Fibromyalgia Mother    Osteoarthritis Mother    Asthma Brother    Depression Brother    Migraines Brother    Asperger's syndrome Brother    Other Brother        Ehlers-Danlos Syndrome   Cancer Maternal Aunt    Asperger's syndrome Maternal Aunt    Breast cancer Maternal Aunt    Heart disease Paternal Aunt    Heart disease Paternal Uncle    Cervical cancer Maternal  Grandmother    Osteoporosis Maternal Grandmother        Died at 68   Osteoarthritis Maternal Grandmother    Colon cancer Maternal Grandfather    Asthma Maternal Grandfather    Asperger's syndrome Maternal Grandfather    Emphysema Maternal Grandfather        Died at 64   Prostate cancer Maternal Grandfather    Heart attack Paternal Grandfather        Died at 55   Asthma Paternal Grandfather    Tuberculosis Paternal Grandfather    Cancer Cousin    Asperger's syndrome Cousin    Asperger's syndrome Other    Heart Problems Paternal Grandmother        Died at 25   Ehlers-Danlos syndrome Father    Ehlers-Danlos syndrome Brother    Family Psychiatric  History: Unknown, none indicated per chart review  Social History:  Social History   Substance and Sexual Activity  Alcohol Use No   Alcohol/week: 0.0 standard drinks     Social History   Substance and Sexual Activity  Drug Use No    Social History   Socioeconomic History   Marital status: Single    Spouse name: Not on file   Number of children: Not on file   Years of education: Not on file   Highest education level: Not on file  Occupational History   Not on file  Tobacco Use   Smoking status: Never   Smokeless tobacco: Never  Vaping Use   Vaping Use: Never used  Substance and Sexual Activity   Alcohol use: No    Alcohol/week: 0.0 standard drinks   Drug use: No   Sexual activity: Never  Other Topics Concern   Not on file  Social History Narrative   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    She lives with her mother.    She enjoys reading, watching TV, writing, and painting.    Social Determinants of Health   Financial Resource Strain: Not on file  Food Insecurity: Not on file  Transportation Needs: Not on file  Physical Activity: Not on file  Stress: Not on file  Social Connections: Not on file   Additional Social History:    Allergies:   Allergies  Allergen Reactions   Augmentin  [Amoxicillin-Pot  Clavulanate] Diarrhea   Chocolate Flavor     Other reaction(s): Other (See Comments) Wheezing, acne   Demerol [Meperidine] Other (See Comments)    Slow to wake up when this drug is given.    Keflex [Cephalexin] Nausea And Vomiting   Morphine And Related Nausea And Vomiting   Tape Dermatitis    Must use paper tape only   Toradol [Ketorolac Tromethamine] Nausea And Vomiting   Chocolate     GI distress   Nsaids  patient develops ulcers   Strawberry Extract     GI distress    Labs:  Results for orders placed or performed during the hospital encounter of 04/22/21 (from the past 48 hour(s))  CBG monitoring, ED     Status: Abnormal   Collection Time: 04/22/21  7:25 AM  Result Value Ref Range   Glucose-Capillary 141 (H) 70 - 99 mg/dL    Comment: Glucose reference range applies only to samples taken after fasting for at least 8 hours.  CBC with Differential     Status: Abnormal   Collection Time: 04/22/21  7:55 AM  Result Value Ref Range   WBC 16.5 (H) 4.0 - 10.5 K/uL   RBC 5.23 (H) 3.87 - 5.11 MIL/uL   Hemoglobin 15.3 (H) 12.0 - 15.0 g/dL   HCT 44.2 36.0 - 46.0 %   MCV 84.5 80.0 - 100.0 fL   MCH 29.3 26.0 - 34.0 pg   MCHC 34.6 30.0 - 36.0 g/dL   RDW 14.0 11.5 - 15.5 %   Platelets 311 150 - 400 K/uL   nRBC 0.0 0.0 - 0.2 %   Neutrophils Relative % 87 %   Neutro Abs 14.3 (H) 1.7 - 7.7 K/uL   Lymphocytes Relative 7 %   Lymphs Abs 1.1 0.7 - 4.0 K/uL   Monocytes Relative 6 %   Monocytes Absolute 0.9 0.1 - 1.0 K/uL   Eosinophils Relative 0 %   Eosinophils Absolute 0.0 0.0 - 0.5 K/uL   Basophils Relative 0 %   Basophils Absolute 0.0 0.0 - 0.1 K/uL   Immature Granulocytes 0 %   Abs Immature Granulocytes 0.06 0.00 - 0.07 K/uL    Comment: Performed at [facility], [address] [place], [place] [address]  Comprehensive metabolic panel     Status: Abnormal   Collection Time: 04/22/21  7:55 AM  Result Value Ref Range   Sodium 138 135 - 145 mmol/L   Potassium 3.7 3.5  - 5.1 mmol/L   Chloride 104 98 - 111 mmol/L   CO2 23 22 - 32 mmol/L   Glucose, Bld 141 (H) 70 - 99 mg/dL    Comment: Glucose reference range applies only to samples taken after fasting for at least 8 hours.   BUN 12 6 - 20 mg/dL   Creatinine, Ser 0.64 0.44 - 1.00 mg/dL   Calcium 9.3 8.9 - 10.3 mg/dL   Total Protein 7.7 6.5 - 8.1 g/dL   Albumin 4.1 3.5 - 5.0 g/dL   AST 18 15 - 41 U/L   ALT 12 0 - 44 U/L   Alkaline Phosphatase 75 38 - 126 U/L   Total Bilirubin 0.7 0.3 - 1.2 mg/dL   GFR, Estimated >60 >60 mL/min    Comment: (NOTE) Calculated using the CKD-EPI Creatinine Equation (2021)    Anion gap 11 5 - 15    Comment: Performed at [facility], [address] [place], [place] [address]  Acetaminophen level     Status: Abnormal   Collection Time: 04/22/21  7:55 AM  Result Value Ref Range   Acetaminophen (Tylenol), Serum <10 (L) 10 - 30 ug/mL    Comment: (NOTE) Therapeutic concentrations vary significantly. A range of 10-30 ug/mL  may be an effective concentration for many patients. However, some  are best treated at concentrations outside of this range. Acetaminophen concentrations >150 ug/mL at 4 hours after ingestion  and >50 ug/mL at 12 hours after ingestion are often associated with  toxic reactions.  Performed at [facility], [address] [place], [place] [id number]   Salicylate level     Status: Abnormal   Collection Time: 04/22/21  7:55 AM  Result Value Ref Range   Salicylate Lvl Q000111Q (L) 7.0 - 30.0 mg/dL    Comment: Performed at [facility], [address] [place], [place] [address]  Lactic acid, plasma     Status: None   Collection Time: 04/22/21  7:55 AM  Result Value Ref Range   Lactic Acid, Venous 1.6 0.5 - 1.9 mmol/L    Comment: Performed at [facility], [address] [place], [place] [address]  Ethanol     Status: None   Collection Time: 04/22/21  7:55 AM  Result Value Ref Range   Alcohol, Ethyl (B) <10 <10  mg/dL    Comment: (NOTE) Lowest detectable limit for serum alcohol is 10 mg/dL.  For medical purposes only. Performed at [facility], [place], [place] [address]   Troponin I (High Sensitivity)     Status: None   Collection Time: 04/22/21  7:55 AM  Result Value Ref Range   Troponin I (High Sensitivity) 5 <18 ng/L    Comment: (NOTE) Elevated high sensitivity troponin I (hsTnI) values and significant  changes across serial measurements may suggest ACS but many other  chronic and acute conditions are known to elevate hsTnI results.  Refer to the "Links" section for chest pain algorithms and additional  guidance. Performed at [facility], [address] [place], [place] [address]   Urinalysis, Complete w Microscopic     Status: Abnormal   Collection Time: 04/22/21  7:55 AM  Result Value Ref Range   Color, Urine YELLOW YELLOW   APPearance CLEAR (A) CLEAR   Specific Gravity, Urine 1.015 1.005 - 1.030   pH 7.0 5.0 - 8.0   Glucose, UA NEGATIVE NEGATIVE mg/dL   Hgb urine dipstick NEGATIVE NEGATIVE   Bilirubin Urine NEGATIVE NEGATIVE   Ketones, ur NEGATIVE NEGATIVE mg/dL   Protein, ur NEGATIVE NEGATIVE mg/dL   Nitrite NEGATIVE NEGATIVE   Leukocytes,Ua SMALL (A) NEGATIVE   RBC / HPF 0-5 0 - 5 RBC/hpf   WBC, UA 11-20 0 - 5 WBC/hpf   Bacteria, UA MANY (A) NONE SEEN   Squamous Epithelial / LPF 0-5 0 - 5   Mucus PRESENT     Comment: Performed at [facility], [address] [place], [place] [address]  Urine Drug Screen, Qualitative     Status: Abnormal   Collection Time: 04/22/21  7:55 AM  Result Value Ref Range   Tricyclic, Ur Screen POSITIVE (A) NONE DETECTED   Amphetamines, Ur Screen POSITIVE (A) NONE DETECTED   MDMA (Ecstasy)Ur Screen NONE DETECTED NONE DETECTED   Cocaine Metabolite,Ur [place] NONE DETECTED NONE DETECTED   Opiate, Ur Screen NONE DETECTED NONE DETECTED   Phencyclidine (PCP) Ur S NONE DETECTED NONE DETECTED    Cannabinoid 50 Ng, Ur [place] NONE DETECTED NONE DETECTED   Barbiturates, Ur Screen NONE DETECTED NONE DETECTED   Benzodiazepine, Ur Scrn NONE DETECTED NONE DETECTED   Methadone Scn, Ur NONE DETECTED NONE DETECTED    Comment: (NOTE) Tricyclics + metabolites, urine    Cutoff 1000 ng/mL Amphetamines + metabolites, urine  Cutoff 1000 ng/mL MDMA (Ecstasy), urine              Cutoff 500 ng/mL Cocaine Metabolite, urine          Cutoff 300 ng/mL Opiate +  metabolites, urine        Cutoff 300 ng/mL Phencyclidine (PCP), urine         Cutoff 25 ng/mL Cannabinoid, urine                 Cutoff 50 ng/mL Barbiturates + metabolites, urine  Cutoff 200 ng/mL Benzodiazepine, urine              Cutoff 200 ng/mL Methadone, urine                   Cutoff 300 ng/mL  The urine drug screen provides only a preliminary, unconfirmed analytical test result and should not be used for non-medical purposes. Clinical consideration and professional judgment should be applied to any positive drug screen result due to possible interfering substances. A more specific alternate chemical method must be used in order to obtain a confirmed analytical result. Gas chromatography / mass spectrometry (GC/MS) is the preferred confirm atory method. Performed at [facility], [address] [place], [place] [address]   Resp Panel by RT-PCR (Flu A&B, Covid)     Status: None   Collection Time: 04/22/21  7:55 AM   Specimen: Nasopharyngeal(NP) swabs in vial transport medium  Result Value Ref Range   SARS Coronavirus 2 by RT PCR NEGATIVE NEGATIVE    Comment: (NOTE) SARS-CoV-2 target nucleic acids are NOT DETECTED.  The SARS-CoV-2 RNA is generally detectable in upper respiratory specimens during the acute phase of infection. The lowest concentration of SARS-CoV-2 viral copies this assay can detect is 138 copies/mL. A negative result does not preclude SARS-Cov-2 infection and should not be used as the sole basis for treatment  or other patient management decisions. A negative result may occur with  improper specimen collection/handling, submission of specimen other than nasopharyngeal swab, presence of viral mutation(s) within the areas targeted by this assay, and inadequate number of viral copies(<138 copies/mL). A negative result must be combined with clinical observations, patient history, and epidemiological information. The expected result is Negative.  Fact Sheet for Patients:  EntrepreneurPulse.com.au  Fact Sheet for Healthcare Providers:  IncredibleEmployment.be  This test is no t yet approved or cleared by the [place] FDA and  has been authorized for detection and/or diagnosis of SARS-CoV-2 by FDA under an Emergency Use Authorization (EUA). This EUA will remain  in effect (meaning this test can be used) for the duration of the COVID-19 declaration under Section 564(b)(1) of the Act, 21 U.S.C.section 360bbb-3(b)(1), unless the authorization is terminated  or revoked sooner.       Influenza A by PCR NEGATIVE NEGATIVE   Influenza B by PCR NEGATIVE NEGATIVE    Comment: (NOTE) The Xpert Xpress SARS-CoV-2/FLU/RSV plus assay is intended as an aid in the diagnosis of influenza from Nasopharyngeal swab specimens and should not be used as a sole basis for treatment. Nasal washings and aspirates are unacceptable for Xpert Xpress SARS-CoV-2/FLU/RSV testing.  Fact Sheet for Patients: EntrepreneurPulse.com.au  Fact Sheet for Healthcare Providers: IncredibleEmployment.be  This test is not yet approved or cleared by the [place] FDA and has been authorized for detection and/or diagnosis of SARS-CoV-2 by FDA under an Emergency Use Authorization (EUA). This EUA will remain in effect (meaning this test can be used) for the duration of the COVID-19 declaration under Section 564(b)(1) of the Act, 21 U.S.C. section  360bbb-3(b)(1), unless the authorization is terminated or revoked.  Performed at [facility], [address] [place], [place] [address]   Ammonia  Status: None   Collection Time: 04/22/21  7:55 AM  Result Value Ref Range   Ammonia 22 9 - 35 umol/L    Comment: Performed at [facility], [address] [place], [place] [address]  Pregnancy, urine     Status: None   Collection Time: 04/22/21  7:55 AM  Result Value Ref Range   Preg Test, Ur NEGATIVE NEGATIVE    Comment: Performed at [facility], [place], [place] [address]  Troponin I (High Sensitivity)     Status: None   Collection Time: 04/22/21  9:11 AM  Result Value Ref Range   Troponin I (High Sensitivity) 2 <18 ng/L    Comment: (NOTE) Elevated high sensitivity troponin I (hsTnI) values and significant  changes across serial measurements may suggest ACS but many other  chronic and acute conditions are known to elevate hsTnI results.  Refer to the "Links" section for chest pain algorithms and additional  guidance. Performed at [facility], [address] [place], [place] [address]   Procalcitonin     Status: None   Collection Time: 04/22/21  9:11 AM  Result Value Ref Range   Procalcitonin <0.10 ng/mL    Comment:        Interpretation: PCT (Procalcitonin) <= 0.5 ng/mL: Systemic infection (sepsis) is not likely. Local bacterial infection is possible. (NOTE)       Sepsis PCT Algorithm           Lower Respiratory Tract                                      Infection PCT Algorithm    ----------------------------     ----------------------------         PCT < 0.25 ng/mL                PCT < 0.10 ng/mL          Strongly encourage             Strongly discourage   discontinuation of antibiotics    initiation of antibiotics    ----------------------------     -----------------------------       PCT 0.25 - 0.50 ng/mL            PCT 0.10 - 0.25 ng/mL                OR       >80% decrease in PCT            Discourage initiation of                                            antibiotics      Encourage discontinuation           of antibiotics    ----------------------------     -----------------------------         PCT >= 0.50 ng/mL              PCT 0.26 - 0.50 ng/mL               AND        <80% decrease in PCT             Encourage initiation of  antibiotics       Encourage continuation           of antibiotics    ----------------------------     -----------------------------        PCT >= 0.50 ng/mL                  PCT > 0.50 ng/mL               AND         increase in PCT                  Strongly encourage                                      initiation of antibiotics    Strongly encourage escalation           of antibiotics                                     -----------------------------                                           PCT <= 0.25 ng/mL                                                 OR                                        > 80% decrease in PCT                                      Discontinue / Do not initiate                                             antibiotics  Performed at [facility], [address], [place], [place] [address]     Current Facility-Administered Medications  Medication Dose Route Frequency Provider Last Rate Last Admin   albuterol (PROVENTIL) (2.5 MG/3ML) 0.083% nebulizer solution 2.5 mg  2.5 mg Nebulization Q4H PRN Ivor Costa, MD       LORazepam (ATIVAN) injection 1 mg  1 mg Intravenous Q2H PRN Ivor Costa, MD        Musculoskeletal: Strength & Muscle Tone: decreased Gait & Station:  Customer service manager bound Patient leans: N/A    Psychiatric Specialty Exam:  Presentation  General Appearance:  Appropriate for Environment Eye Contact: Fleeting Speech: Clear and Coherent Speech Volume: Normal Handedness: No data recorded  Mood and Affect   Mood: Dysphoric (physically ill) Affect: Blunt; Restricted  Thought Process  Thought Processes: No data recorded Descriptions of Associations:-- (UTA) Orientation:Partial Thought Content:No data recorded History of Schizophrenia/Schizoaffective disorder:No data recorded Duration of Psychotic Symptoms:No data recorded Hallucinations:Hallucinations: -- (Does not appear to be responding  to internal stimuli) Ideas of Reference:No data recorded Suicidal Thoughts:No data recorded Homicidal Thoughts:No data recorded  Sensorium  Memory: No data recorded Judgment: Impaired Insight: No data recorded  Executive Functions  Concentration: No data recorded Attention Span: Poor Recall: Poor Fund of Knowledge: No data recorded Language: No data recorded  Psychomotor Activity  Psychomotor Activity: Psychomotor Activity: Other (comment)  Assets  Assets: No data recorded  Sleep  Sleep: No data recorded  Physical Exam: Physical Exam Vitals and nursing note reviewed.  HENT:     Head: Normocephalic.     Nose: No congestion or rhinorrhea.  Eyes:     General:        Right eye: No discharge.        Left eye: No discharge.  Cardiovascular:     Rate and Rhythm: Normal rate.  Pulmonary:     Effort: Pulmonary effort is normal.  Skin:    General: Skin is warm and dry.  Psychiatric:        Attention and Perception: She is inattentive.        Mood and Affect: Affect is flat.        Speech: Speech normal.        Behavior: Behavior is withdrawn.     Comments: Patient physically ill, expresses not feeling well enough for complete interview. Is being admitted to medical unit.    ROS Blood pressure 120/78, pulse 84, temperature 98.8 F (37.1 C), temperature source Oral, resp. rate 20, height '4\' 11"'$  (1.499 m), weight 59.9 kg, SpO2 96 %. Body mass index is 26.67 kg/m.  Treatment Plan Summary: Daily contact with patient to assess and evaluate symptoms and progress in  treatment. Patient is a 45 year old female with a significant medical history, including Ehlers-Danlos syndrome, fibromyalgia, sleep apnea, tics of organic origin, and recent hospitalization for hallucinations and paranoia who presented to ED for altered mentation and catatonia. Patient found to have UTI, encephalopathy. Being admitted to medical floor. Will follow up.   Disposition:  Patient being admitted to medical floor.    Sherlon Handing, NP 04/22/2021 4:02 PM

## 2021-04-22 NOTE — Progress Notes (Signed)
Patient placed on home unit at this time, per request. Patient's equipment found to be in proper working order, no lose or frayed cords. Plugged into red outlet, mask adjusted to patient preference, call bell placed beside patient.

## 2021-04-22 NOTE — H&P (Addendum)
History and Physical    ANCHAL MAKINO [id number] DOB: January 23, 1976 DOA: 04/22/2021  Referring MD/NP/PA:   PCP: Birdie Sons, MD   Patient coming from:  The patient is coming from home.     Chief Complaint: AMS  HPI: ALETA SCHUCHARD is a 45 y.o. female with medical history significant of paranoid, psychosis, tics of organic origin, asthma, OSA (central dominant) on BiPAP, Ehlers-Danlos syndrome, fibromyalgia, migraine headache, transient alteration of awareness, MVP, urinary retention with Foley cath placement, seizure, rheumatoid arthritis, TBI 1990 wheelchair-bound, who presents with altered mental status.  Per pt's mother at bedside, pt was found to be confused with hallucination in this morning. Pt has whole body rigidity and catatonia, with eyes open but not responsive. Mother states that the patient was recently hospitalized to [facility] psychiatry for paranoia and hallucinations. Pt was evaluated for medical etiologies of psychosis and evaluated by neurology with unremarkable MRI.  She was discharged home with increased Haldol dosage to 1 mg twice daily. Pt was given 2 mg Ativan and 2 mg of benztropine in ED.  Her mental status has improved.  Her rigidity has resolved when I saw patient in ED.  Currently patient is drowsy, but easily arousable, and is oriented x3.  Patient denies chest pain, cough, shortness breath, no fever or chills. She complaints of suprapubic abdominal and pelvic pain.  No nausea, vomiting or diarrhea.   ED Course: pt was found to have WBC 16.5, positive urinalysis (clear appearance, small amount of leukocyte, many bacteria, WBC 11-20), alcohol level less than 10, ammonia level 22, negative COVID PCR, Tylenol level less than 10, salicylate level less than 7, troponin level 5 --> 2, UDS positive for amphetamine and TCA (patient is taking Adderall), lactic acid 1.6, electrolytes renal function okay.  ABG with pH 7.39, CO2 48, O2 52. Temperature normal, blood  pressure 126/89, heart rate 108, RR 24, oxygen saturation 99% on room air.  CT head is negative for acute intracranial abnormalities.  Patient is admitted to [facility] bed as inpatient.  Dr. [name] of psychiatry and Dr. Curly Shores of neuro are consulted.   Review of Systems:   General: no fevers, chills, no body weight gain, has fatigue HEENT: no blurry vision, hearing changes or sore throat Respiratory: no dyspnea, coughing, wheezing CV: no chest pain, no palpitations GI: no nausea, vomiting, has lower abdominal pain, no diarrhea, constipation GU: no dysuria, burning on urination, increased urinary frequency, hematuria  Ext: no leg edema Neuro: no unilateral weakness, numbness, or tingling, no vision change or hearing loss. Has rigidity, catatonia and altered mental status Skin: no rash, no skin tear. MSK: No muscle spasm, no deformity, no limitation of range of movement in spin Heme: No easy bruising.  Travel history: No recent long distant travel.  Allergy:  Allergies  Allergen Reactions   Augmentin  [Amoxicillin-Pot Clavulanate] Diarrhea   Chocolate Flavor     Other reaction(s): Other (See Comments) Wheezing, acne   Demerol [Meperidine] Other (See Comments)    Slow to wake up when this drug is given.    Keflex [Cephalexin] Nausea And Vomiting   Morphine And Related Nausea And Vomiting   Tape Dermatitis    Must use paper tape only   Toradol [Ketorolac Tromethamine] Nausea And Vomiting   Chocolate     GI distress   Nsaids     patient develops ulcers   Strawberry Extract     GI distress    Past Medical History:  Diagnosis Date  Ehlers-Danlos syndrome    Fibromyalgia    Headache    Sleep apnea    Tics of organic origin    Transient alteration of awareness     Past Surgical History:  Procedure Laterality Date   COLONOSCOPY WITH PROPOFOL N/A 02/13/2018   Procedure: COLONOSCOPY WITH PROPOFOL;  Surgeon: [name], MD;  Location: [facility] ENDOSCOPY;  Service: Endoscopy;   Laterality: N/A;   DILATION AND CURETTAGE OF UTERUS  2009   ESOPHAGOGASTRODUODENOSCOPY (EGD) WITH PROPOFOL  02/13/2018   Procedure: ESOPHAGOGASTRODUODENOSCOPY (EGD) WITH PROPOFOL;  Surgeon: [name], MD;  Location: [facility] ENDOSCOPY;  Service: Endoscopy;;   EYE SURGERY Left 1990   3 Surgeries on left eye to correct crossed eye   LAPAROSCOPY Left 2003   Fallopian Tube   [place]   3 Surgeries to repair broken arm   OTHER SURGICAL HISTORY Left    3 surgeries on her left thigh as an infant   OTHER SURGICAL HISTORY  1998   Jaw surgery   Right arm surgery  1987   x 3 due to fracture   TOENAIL EXCISION Bilateral 06/13/2019   Procedure: BILATERAL SECOND TOE PARTIAL NAIL ABLATION;  Surgeon: Erle Crocker, MD;  Location: [place];  Service: Orthopedics;  Laterality: Bilateral;  SURGERY REQUEST TIME 1 HOUR   TONSILLECTOMY  12/13/2002    Social History:  reports that she has never smoked. She has never used smokeless tobacco. She reports that she does not drink alcohol and does not use drugs.  Family History:  Family History  Problem Relation Age of Onset   Depression Mother    Stroke Mother    Fibromyalgia Mother    Osteoarthritis Mother    Asthma Brother    Depression Brother    Migraines Brother    Asperger's syndrome Brother    Other Brother        Ehlers-Danlos Syndrome   Cancer Maternal Aunt    Asperger's syndrome Maternal Aunt    Breast cancer Maternal Aunt    Heart disease Paternal Aunt    Heart disease Paternal Uncle    Cervical cancer Maternal Grandmother    Osteoporosis Maternal Grandmother        Died at 19   Osteoarthritis Maternal Grandmother    Colon cancer Maternal Grandfather    Asthma Maternal Grandfather    Asperger's syndrome Maternal Grandfather    Emphysema Maternal Grandfather        Died at 75   Prostate cancer Maternal Grandfather    Heart attack Paternal Grandfather        Died  at 83   Asthma Paternal Grandfather    Tuberculosis Paternal Grandfather    Cancer Cousin    Asperger's syndrome Cousin    Asperger's syndrome Other    Heart Problems Paternal Grandmother        Died at 49   Ehlers-Danlos syndrome Father    Ehlers-Danlos syndrome Brother      Prior to Admission medications   Medication Sig Start Date End Date Taking? Authorizing Provider  albuterol (VENTOLIN HFA) 108 (90 Base) MCG/ACT inhaler Inhale 2 puffs into the lungs every 6 (six) hours as needed. 06/27/19   Baird Lyons D, MD  amphetamine-dextroamphetamine (ADDERALL XR) 15 MG 24 hr capsule Take 1 capsule by mouth every morning. 03/22/21   Deneise Lever, MD  azelastine (OPTIVAR) 0.05 % ophthalmic solution Apply to eye as directed. 1 drop in each  eye prn 02/13/20   [provider]  busPIRone (BUSPAR) 10 MG tablet Take 1 tablet (10 mg total) by mouth 2 (two) times daily. Take along with '15mg'$  tablet twice a day 03/23/21   Birdie Sons, MD  busPIRone (BUSPAR) 15 MG tablet TAKE 1 TABLET BY MOUTH 2 TIMES DAILY. 12/30/20   Birdie Sons, MD  celecoxib (CELEBREX) 200 MG capsule TAKE 1 CAPSULE BY MOUTH TWICE A DAY 03/01/21   Birdie Sons, MD  cyclobenzaprine (FLEXERIL) 5 MG tablet Take 1 tablet (5 mg total) by mouth 3 (three) times daily as needed for muscle spasms. Do not take within 6 hours of taking tizandine 03/26/21   Birdie Sons, MD  diclofenac Sodium (VOLTAREN) 1 % GEL APPLY AS NEEDED 3 TIMES A DAY 11/16/20   Birdie Sons, MD  DULoxetine (CYMBALTA) 60 MG capsule TAKE 2 CAPSULES BY MOUTH EVERY DAY 02/03/21   Birdie Sons, MD  fentaNYL (DURAGESIC) 100 MCG/HR Place 1 patch onto the skin every 3 (three) days. 03/16/21   Birdie Sons, MD  fexofenadine (ALLEGRA) 180 MG tablet Take 180 mg by mouth daily.    [provider]  fluticasone (FLONASE) 50 MCG/ACT nasal spray SPRAY 2 SPRAYS INTO EACH NOSTRIL EVERY DAY 05/10/18   Chrismon, Vickki Muff, PA-C  fluticasone (FLOVENT HFA) 110  MCG/ACT inhaler SMARTSIG:2 Puff(s) By Mouth Twice Daily 02/04/21   [provider]  Fluticasone-Salmeterol (ADVAIR) 250-50 MCG/DOSE AEPB Inhale 1 puff into the lungs every 12 (twelve) hours. Rinse mouth after use 04/02/20   Baird Lyons D, MD  gabapentin (NEURONTIN) 600 MG tablet TAKE 2 TABLETS IN THE MORNING AND 3 TABLETS AT BEDTIME 01/15/21   Jodi Geralds, MD  haloperidol (HALDOL) 0.5 MG tablet TAKE 1/2 A TAB BY MOUTH TWICE A DAY 01/15/21   Jodi Geralds, MD  haloperidol (HALDOL) 1 MG tablet Take 1 mg by mouth 2 (two) times daily. 04/15/21   [provider]  loratadine (CLARITIN) 10 MG tablet Take 10 mg by mouth daily.    [provider]  omeprazole (PRILOSEC) 40 MG capsule TAKE 1 CAPSULE (40 MG TOTAL) BY MOUTH DAILY. 04/10/21   Birdie Sons, MD  ondansetron (ZOFRAN-ODT) 4 MG disintegrating tablet DISSOLVE 1 TABLET IN MOUTH EVERY 8 HOURS FOR NAUSEA AND VOMITING 09/29/20   Birdie Sons, MD  pregabalin (LYRICA) 300 MG capsule Take 1 capsule (300 mg total) by mouth 2 (two) times daily. 12/30/20   Birdie Sons, MD  QUEtiapine (SEROQUEL) 200 MG tablet TAKE 1 TABLET BY MOUTH AT BEDTIME. 12/30/20   Birdie Sons, MD  tamsulosin (FLOMAX) 0.4 MG CAPS capsule Take 1 capsule (0.4 mg total) by mouth daily. 02/22/21   MacDiarmid, Nicki Reaper, MD  tiZANidine (ZANAFLEX) 4 MG tablet TAKE 0.5-1 TABLETS (2-4 MG TOTAL) BY MOUTH EVERY 8 (EIGHT) HOURS AS NEEDED FOR MUSCLE SPASMS. 04/14/21   Birdie Sons, MD    Physical Exam: Vitals:   04/22/21 1030 04/22/21 1347 04/22/21 1418 04/22/21 1519  BP: 126/89 119/78 124/77 120/78  Pulse: 97 98 94 84  Resp: '20 20 20 20  '$ Temp: 98.6 F (37 C)  98.4 F (36.9 C) 98.8 F (37.1 C)  TempSrc: Oral  Oral Oral  SpO2: 99% 100%  96%  Weight:      Height:       General: Not in acute distress HEENT:       Eyes: PERRL, EOMI, no scleral icterus.  ENT: No discharge from the ears and nose, no pharynx injection, no tonsillar  enlargement.        Neck: No JVD, no bruit, no mass felt. Heme: No neck lymph node enlargement. Cardiac: S1/S2, RRR, No murmurs, No gallops or rubs. Respiratory: No rales, wheezing, rhonchi or rubs. GI: Soft, nondistended, has mild tenderness in suprapubic area, no rebound pain, no organomegaly, BS present. GU: No hematuria Ext: No pitting leg edema bilaterally. 1+DP/PT pulse bilaterally. Musculoskeletal: No joint deformities, No joint redness or warmth, no limitation of ROM in spin. Skin: No rashes.  Neuro: Currently patient is drowsy, easily arousable, oriented X3, cranial nerves II-XII grossly intact, moves all extremities. Psych: no suicidal or hemocidal ideation, had hallucination.  Labs on Admission: I have personally reviewed following labs and imaging studies  CBC: Recent Labs  Lab 04/22/21 0755  WBC 16.5*  NEUTROABS 14.3*  HGB 15.3*  HCT 44.2  MCV 84.5  PLT [id number]   Basic Metabolic Panel: Recent Labs  Lab 04/22/21 0755  NA 138  K 3.7  CL 104  CO2 23  GLUCOSE 141*  BUN 12  CREATININE 0.64  CALCIUM 9.3   GFR: Estimated Creatinine Clearance: 70.7 mL/min (by C-G formula based on SCr of 0.64 mg/dL). Liver Function Tests: Recent Labs  Lab 04/22/21 0755  AST 18  ALT 12  ALKPHOS 75  BILITOT 0.7  PROT 7.7  ALBUMIN 4.1   No results for input(s): LIPASE, AMYLASE in the last 168 hours. Recent Labs  Lab 04/22/21 0755  AMMONIA 22   Coagulation Profile: No results for input(s): INR, PROTIME in the last 168 hours. Cardiac Enzymes: No results for input(s): CKTOTAL, CKMB, CKMBINDEX, TROPONINI in the last 168 hours. BNP (last 3 results) No results for input(s): PROBNP in the last 8760 hours. HbA1C: No results for input(s): HGBA1C in the last 72 hours. CBG: Recent Labs  Lab 04/22/21 0725  GLUCAP 141*   Lipid Profile: No results for input(s): CHOL, HDL, LDLCALC, TRIG, CHOLHDL, LDLDIRECT in the last 72 hours. Thyroid Function Tests: No results for input(s):  TSH, T4TOTAL, FREET4, T3FREE, THYROIDAB in the last 72 hours. Anemia Panel: No results for input(s): VITAMINB12, FOLATE, FERRITIN, TIBC, IRON, RETICCTPCT in the last 72 hours. Urine analysis:    Component Value Date/Time   COLORURINE YELLOW 04/22/2021 0755   APPEARANCEUR CLEAR (A) 04/22/2021 0755   APPEARANCEUR Cloudy (A) 11/18/2019 0953   LABSPEC 1.015 04/22/2021 0755   LABSPEC 1.004 02/19/2014 0142   PHURINE 7.0 04/22/2021 0755   GLUCOSEU NEGATIVE 04/22/2021 0755   GLUCOSEU Negative 02/19/2014 0142   HGBUR NEGATIVE 04/22/2021 [place] 04/22/2021 0755   BILIRUBINUR Negative 11/18/2019 0953   BILIRUBINUR Negative 02/19/2014 0142   KETONESUR NEGATIVE 04/22/2021 0755   PROTEINUR NEGATIVE 04/22/2021 0755   UROBILINOGEN 0.2 10/09/2019 1632   NITRITE NEGATIVE 04/22/2021 0755   LEUKOCYTESUR SMALL (A) 04/22/2021 0755   LEUKOCYTESUR Negative 02/19/2014 0142   Sepsis Labs: '@LABRCNTIP'$ (procalcitonin:4,lacticidven:4) ) Recent Results (from the past 240 hour(s))  Resp Panel by RT-PCR (Flu A&B, Covid)     Status: None   Collection Time: 04/22/21  7:55 AM   Specimen: Nasopharyngeal(NP) swabs in vial transport medium  Result Value Ref Range Status   SARS Coronavirus 2 by RT PCR NEGATIVE NEGATIVE Final    Comment: (NOTE) SARS-CoV-2 target nucleic acids are NOT DETECTED.  The SARS-CoV-2 RNA is generally detectable in upper respiratory specimens during the acute phase of infection. The lowest concentration of SARS-CoV-2 viral copies this  assay can detect is 138 copies/mL. A negative result does not preclude SARS-Cov-2 infection and should not be used as the sole basis for treatment or other patient management decisions. A negative result may occur with  improper specimen collection/handling, submission of specimen other than nasopharyngeal swab, presence of viral mutation(s) within the areas targeted by this assay, and inadequate number of viral copies(<138 copies/mL). A  negative result must be combined with clinical observations, patient history, and epidemiological information. The expected result is Negative.  Fact Sheet for Patients:  EntrepreneurPulse.com.au  Fact Sheet for Healthcare Providers:  IncredibleEmployment.be  This test is no t yet approved or cleared by the [place] FDA and  has been authorized for detection and/or diagnosis of SARS-CoV-2 by FDA under an Emergency Use Authorization (EUA). This EUA will remain  in effect (meaning this test can be used) for the duration of the COVID-19 declaration under Section 564(b)(1) of the Act, 21 U.S.C.section 360bbb-3(b)(1), unless the authorization is terminated  or revoked sooner.       Influenza A by PCR NEGATIVE NEGATIVE Final   Influenza B by PCR NEGATIVE NEGATIVE Final    Comment: (NOTE) The Xpert Xpress SARS-CoV-2/FLU/RSV plus assay is intended as an aid in the diagnosis of influenza from Nasopharyngeal swab specimens and should not be used as a sole basis for treatment. Nasal washings and aspirates are unacceptable for Xpert Xpress SARS-CoV-2/FLU/RSV testing.  Fact Sheet for Patients: EntrepreneurPulse.com.au  Fact Sheet for Healthcare Providers: IncredibleEmployment.be  This test is not yet approved or cleared by the [place] FDA and has been authorized for detection and/or diagnosis of SARS-CoV-2 by FDA under an Emergency Use Authorization (EUA). This EUA will remain in effect (meaning this test can be used) for the duration of the COVID-19 declaration under Section 564(b)(1) of the Act, 21 U.S.C. section 360bbb-3(b)(1), unless the authorization is terminated or revoked.  Performed at [facility], [address] [place], [place] [address]      Radiological Exams on Admission: CT ABDOMEN PELVIS WO CONTRAST  Result Date: 04/22/2021 CLINICAL DATA:  Altered mental status. Abdominal  pain. EXAM: CT ABDOMEN AND PELVIS WITHOUT CONTRAST TECHNIQUE: Multidetector CT imaging of the abdomen and pelvis was performed following the standard protocol without IV contrast. COMPARISON:  11/21/2012 FINDINGS: Lower chest: The lung bases are clear of an acute pulmonary process. No pleural effusions or pulmonary lesions. The heart is normal in size. No pericardial effusion. Hepatobiliary: No hepatic lesions are identified without contrast. No intrahepatic biliary dilatation. The gallbladder is grossly normal. No common bile duct dilatation. Pancreas: No mass, inflammation or ductal dilatation. Spleen: Normal size. No focal lesions. Adrenals/Urinary Tract: Adrenal glands and kidneys are unremarkable. The left kidney demonstrates moderate renal cortical scarring changes. Marked distention of the bladder is noted. It extends well above the umbilicus. Possible bladder outlet obstruction. No bladder wall thickening or bladder mass. No bladder calculi. Stomach/Bowel: The stomach, duodenum and small bowel are grossly normal without oral contrast. No inflammatory changes or obstructive findings. There is moderate stool throughout the colon and down into the rectum where there is moderate fecal impaction and mild rectal wall thickening. Associated mild presacral edema. Vascular/Lymphatic: Scattered aortic calcifications but no aneurysm. No mesenteric or retroperitoneal mass or adenopathy. Reproductive: The uterus and ovaries are unremarkable. Other: No pelvic mass or adenopathy. No free pelvic fluid collections. No inguinal mass or adenopathy. No abdominal wall hernia or subcutaneous lesions. Musculoskeletal: No significant bony findings. IMPRESSION: 1. Moderate stool throughout the colon and  down into the rectum where there is moderate fecal impaction. 2. Distended bladder but no wall thickening or mass. 3. Moderate left renal cortical scarring changes. 4. No other significant abdominal/pelvic findings, mass lesions or  adenopathy. 5. Aortic atherosclerosis. Aortic Atherosclerosis (ICD10-I70.0). Electronically Signed   By: Marijo Sanes M.D.   On: 04/22/2021 09:22   CT Head Wo Contrast  Result Date: 04/22/2021 CLINICAL DATA:  45 year old female with altered mental status. History of Ehlers-Danlos syndrome, EXAM: CT HEAD WITHOUT CONTRAST TECHNIQUE: Contiguous axial images were obtained from the base of the skull through the vertex without intravenous contrast. COMPARISON:  Paranasal sinus CT 1226. FINDINGS: Brain: No midline shift, ventriculomegaly, mass effect, evidence of mass lesion, intracranial hemorrhage or evidence of cortically based acute infarction. Gray-white matter differentiation is within normal limits throughout the brain. Cerebral volume is within normal limits. Vascular: Mild Calcified atherosclerosis at the skull base. No suspicious intracranial vascular hyperdensity. Skull: Negative. Sinuses/Orbits: Mild bubbly opacity in the left sphenoid sinus and posterior right ethmoid mucosal thickening. But other Visualized paranasal sinuses and mastoids are clear. Other: Leftward gaze deviation. Visualized scalp soft tissues are within normal limits. IMPRESSION: 1. Normal noncontrast CT appearance of the brain. 2. Mild paranasal sinus inflammation. Electronically Signed   By: Genevie Ann M.D.   On: 04/22/2021 08:52   DG Chest Port 1 View  Result Date: 04/22/2021 CLINICAL DATA:  Altered mental status. Decreased level of consciousness. EXAM: PORTABLE CHEST 1 VIEW COMPARISON:  Chest x-ray 01/29/2021 FINDINGS: The cardiac silhouette, mediastinal hilar contours are within normal limits and stable. The lungs are clear. No pleural effusions pulmonary lesions. No pneumothorax. The bony thorax is intact. IMPRESSION: No acute cardiopulmonary findings. Electronically Signed   By: Marijo Sanes M.D.   On: 04/22/2021 07:41     EKG: I have personally reviewed.  Sinus rhythm, QTC 436, artificial reflux, early R wave  progression  Assessment/Plan Principal Problem:   Acute metabolic encephalopathy Active Problems:   Tics of organic origin   Depression with anxiety   Seizures (HCC)   Asthma   Paranoid (HCC)   Psychosis ([place])   UTI (urinary tract infection)   Sepsis ([place])   Urinary retention   Acute metabolic encephalopathy: Etiology is not clear.  CT head negative.  Patient has rigidity and catatonia.  Differential diagnosis include side effects of Haldol and seizure.  UTI may have contributed partially to altered mental status.  Her mental status has improved after giving Ativan and benztropine in ED.  Currently patient is oriented x3.  Consulted Dr. [name] of psychiatry and Dr. Curly Shores of neurology  -Admitted to [place] bed as inpatient -Frequent neuro check -EEG -Hold Haldol  Seizure: not taking meds currently -Seizure precaution -When necessary Ativan for seizure -EEG  Tics of organic origin -hold Haldol now  Depression with anxiety -Continue home medications -Follow-up with Dr. Lasandra Beech recommendations  Asthma -Bronchodilators  Paranoid ([place]) and psychosis -Follow-up with Dr. Lasandra Beech recommendations  Sepsis due to UTI (urinary tract infection): Patient admits criteria for sepsis with leukocytosis with WBC 16.5, tachycardia with heart rate of 108, tachypnea with RR 24.  Lactic acid is normal.  Hemodynamically stable -IV Rocephin -will get Procalcitonin  -IVF: 2L of NS bolus  Urinary retention -foley  -flomax      DVT ppx: SQ Lovenox Code Status: Full code Family Communication: Yes, patient's  mother  at bed side Disposition Plan:  Anticipate discharge back to previous environment Consults called:  Dr. [name] of psychiatry and Dr. Curly Shores  of neuro are consulted. Admission status and Level of care: Med-Surg:    Med-surg bed as inpt    Status is: Inpatient  Remains inpatient appropriate because:Inpatient level of care appropriate due to severity of  illness  Dispo: The patient is from: Home              Anticipated d/c is to: Home              Patient currently is not medically stable to d/c.   Difficult to place patient No          Date of Service 04/22/2021    [facility]   If 7PM-7AM, please contact night-coverage [email or web address] 04/22/2021, 6:13 PM

## 2021-04-22 NOTE — ED Notes (Signed)
Pt assisted onto bedside commode.

## 2021-04-22 NOTE — ED Triage Notes (Signed)
Pt arrives to ED from home via [facility] with c/c of altered mental status. Pt has hx of several TBI in the 1990s. Pts family states at baseline, pt is wheelchair bound but is usually A&Ox4. Today pt found by family in living room still in wheelchair, contracted and unresponsive. Eyes open but unblinking. EMS reports transport vitals of 158/96, p100, O2 sat 96%. Upon arrival, pt rigid and contracted. Eyes open, unblinking. Unresponsive to sternal rub. Dr Beather Arbour at pt bedside.

## 2021-04-22 NOTE — ED Provider Notes (Signed)
Procedures  Clinical Course as of 04/22/21 1133  Thu Apr 22, 2021  [id number] Patient unable to straighten arm for lab draw/IV access.  Will administer benztropine and Ativan IM with the hopes to relax her arm in order to proceed with evaluation.  Care will be transferred to the oncoming provider pending lab work, CT imaging studies and disposition. [JS]    Clinical Course User Index [JS] Paulette Blanch, MD    ----------------------------------------- 11:33 AM on 04/22/2021 -----------------------------------------   CT head unremarkable.  CT abdomen obtained as well due to mom reporting low abdominal pain recently.  This shows distended bladder but otherwise no acute findings.  Labs show urinary tract infection, leukocytosis, patient's tachycardia, will start ceftriaxone and plan to admit for her encephalopathy.   Carrie Mew, MD 04/22/21 1133

## 2021-04-22 NOTE — ED Provider Notes (Signed)
[facility] Emergency Department Provider Note   ____________________________________________   Event Date/Time   First MD Initiated Contact with Patient 04/22/21 [phone]     (approximate)  I have reviewed the triage vital signs and the nursing notes.   HISTORY  Chief Complaint Altered Mental Status  Level V caveat: Limited by decreased LOC  HPI Karen [name] is a 45 y.o. female brought to the ED via EMS from home with a chief complaint of altered mentation, catatonia.  Patient has a history of Ehlers-Danlos syndrome, fibromyalgia, chronic pain, prior TBI x3, seizure disorder, OSA, and depression who was admitted to [facility] psychiatry under IVC 8/23-04/15/2021 for paranoia and hallucinations.  While there she was evaluated for medical etiologies of psychosis and evaluated by neurology with unremarkable MRI.  She was discharged home with increased Haldol dosage to 1 mg twice daily.  Mother states patient has been in a waxing/waning catatonia state for the past 3 weeks.  Patient is wheelchair-bound but normally conversive and self-sufficient.  Mother went to bed last night and was surprised to find patient still sitting on the couch this morning with limbs stiffened and clenched, eyes open but not responsive.  Rest of history is limited at this time given patient's nonresponsive state.     Past Medical History:  Diagnosis Date   Ehlers-Danlos syndrome    Fibromyalgia    Headache    Sleep apnea    Tics of organic origin    Transient alteration of awareness     Patient Active Problem List   Diagnosis Date Noted   Chronic tension type headache 06/04/2020   Breast lump on left side at 1 o'clock position 10/09/2019   Fibrocystic breast changes, bilateral 10/09/2019   Family history of colon cancer    Rectal polyp    Heartburn    Excessive somnolence disorder 06/28/2017   Seizures ([place]) 09/12/2016   Difficulty hearing 08/31/2015   Back pain, chronic 08/31/2015    Rheumatoid arthritis ([place]) 08/31/2015   Alopecia 03/10/2015   Anemia 03/10/2015   Arthritis 03/10/2015   Allergic asthma, mild persistent, uncomplicated [id number]   Chronic nausea 03/10/2015   DDD (degenerative disc disease), lumbar 03/10/2015   Depression 03/10/2015   Personal history of MRSA (methicillin resistant Staphylococcus aureus) 03/10/2015   Insomnia 03/10/2015   Mitral valve prolapse 03/10/2015   Paresthesias/numbness 03/10/2015   Allergic rhinitis, seasonal 03/10/2015   Seizure disorder ([place]) 03/10/2015   Mixed sleep apnea, Central predominant 03/10/2015   Migraine without aura 01/27/2014   Fibromyalgia 01/27/2014   Transient alteration of awareness 01/07/2014   Tics of organic origin 01/07/2014   Backache, unspecified 01/07/2014   Ehlers-Danlos syndrome 01/07/2014    Past Surgical History:  Procedure Laterality Date   COLONOSCOPY WITH PROPOFOL N/A 02/13/2018   Procedure: COLONOSCOPY WITH PROPOFOL;  Surgeon: [name], MD;  Location: [facility] ENDOSCOPY;  Service: Endoscopy;  Laterality: N/A;   DILATION AND CURETTAGE OF UTERUS  2009   ESOPHAGOGASTRODUODENOSCOPY (EGD) WITH PROPOFOL  02/13/2018   Procedure: ESOPHAGOGASTRODUODENOSCOPY (EGD) WITH PROPOFOL;  Surgeon: [name], MD;  Location: [facility] ENDOSCOPY;  Service: Endoscopy;;   EYE SURGERY Left 1990   3 Surgeries on left eye to correct crossed eye   LAPAROSCOPY Left 2003   Fallopian Tube   [place] Right 1987   3 Surgeries to repair broken arm   OTHER SURGICAL HISTORY Left    3 surgeries on her left thigh as an infant  OTHER SURGICAL HISTORY  1998   Jaw surgery   Right arm surgery  1987   x 3 due to fracture   TOENAIL EXCISION Bilateral 06/13/2019   Procedure: BILATERAL SECOND TOE PARTIAL NAIL ABLATION;  Surgeon: Erle Crocker, MD;  Location: [place];  Service: Orthopedics;  Laterality: Bilateral;  SURGERY REQUEST TIME 1 HOUR   TONSILLECTOMY   12/13/2002    Prior to Admission medications   Medication Sig Start Date End Date Taking? Authorizing Provider  albuterol (VENTOLIN HFA) 108 (90 Base) MCG/ACT inhaler Inhale 2 puffs into the lungs every 6 (six) hours as needed. 06/27/19   Baird Lyons D, MD  amphetamine-dextroamphetamine (ADDERALL XR) 15 MG 24 hr capsule Take 1 capsule by mouth every morning. 03/22/21   Deneise Lever, MD  azelastine (OPTIVAR) 0.05 % ophthalmic solution Apply to eye as directed. 1 drop in each eye prn 02/13/20   [provider]  busPIRone (BUSPAR) 10 MG tablet Take 1 tablet (10 mg total) by mouth 2 (two) times daily. Take along with '15mg'$  tablet twice a day 03/23/21   Birdie Sons, MD  busPIRone (BUSPAR) 15 MG tablet TAKE 1 TABLET BY MOUTH 2 TIMES DAILY. 12/30/20   Birdie Sons, MD  celecoxib (CELEBREX) 200 MG capsule TAKE 1 CAPSULE BY MOUTH TWICE A DAY 03/01/21   Birdie Sons, MD  cyclobenzaprine (FLEXERIL) 5 MG tablet Take 1 tablet (5 mg total) by mouth 3 (three) times daily as needed for muscle spasms. Do not take within 6 hours of taking tizandine 03/26/21   Birdie Sons, MD  diclofenac Sodium (VOLTAREN) 1 % GEL APPLY AS NEEDED 3 TIMES A DAY 11/16/20   Birdie Sons, MD  DULoxetine (CYMBALTA) 60 MG capsule TAKE 2 CAPSULES BY MOUTH EVERY DAY 02/03/21   Birdie Sons, MD  fentaNYL (DURAGESIC) 100 MCG/HR Place 1 patch onto the skin every 3 (three) days. 03/16/21   Birdie Sons, MD  fexofenadine (ALLEGRA) 180 MG tablet Take 180 mg by mouth daily.    [provider]  fluticasone (FLONASE) 50 MCG/ACT nasal spray SPRAY 2 SPRAYS INTO EACH NOSTRIL EVERY DAY 05/10/18   Chrismon, Vickki Muff, PA-C  fluticasone (FLOVENT HFA) 110 MCG/ACT inhaler SMARTSIG:2 Puff(s) By Mouth Twice Daily 02/04/21   [provider]  Fluticasone-Salmeterol (ADVAIR) 250-50 MCG/DOSE AEPB Inhale 1 puff into the lungs every 12 (twelve) hours. Rinse mouth after use 04/02/20   Baird Lyons D, MD  gabapentin  (NEURONTIN) 600 MG tablet TAKE 2 TABLETS IN THE MORNING AND 3 TABLETS AT BEDTIME 01/15/21   Jodi Geralds, MD  haloperidol (HALDOL) 0.5 MG tablet TAKE 1/2 A TAB BY MOUTH TWICE A DAY 01/15/21   Jodi Geralds, MD  loratadine (CLARITIN) 10 MG tablet Take 10 mg by mouth daily.    [provider]  omeprazole (PRILOSEC) 40 MG capsule TAKE 1 CAPSULE (40 MG TOTAL) BY MOUTH DAILY. 04/10/21   Birdie Sons, MD  ondansetron (ZOFRAN-ODT) 4 MG disintegrating tablet DISSOLVE 1 TABLET IN MOUTH EVERY 8 HOURS FOR NAUSEA AND VOMITING 09/29/20   Birdie Sons, MD  pregabalin (LYRICA) 300 MG capsule Take 1 capsule (300 mg total) by mouth 2 (two) times daily. 12/30/20   Birdie Sons, MD  QUEtiapine (SEROQUEL) 200 MG tablet TAKE 1 TABLET BY MOUTH AT BEDTIME. 12/30/20   Birdie Sons, MD  tamsulosin (FLOMAX) 0.4 MG CAPS capsule Take 1 capsule (0.4 mg total) by mouth daily. 02/22/21  MacDiarmid, Nicki Reaper, MD  tiZANidine (ZANAFLEX) 4 MG tablet TAKE 0.5-1 TABLETS (2-4 MG TOTAL) BY MOUTH EVERY 8 (EIGHT) HOURS AS NEEDED FOR MUSCLE SPASMS. 04/14/21   Birdie Sons, MD    Allergies Augmentin  [amoxicillin-pot clavulanate], Chocolate flavor, Demerol [meperidine], Keflex [cephalexin], Morphine and related, Toradol [ketorolac tromethamine], Chocolate, Nsaids, Strawberry extract, and Tape  Family History  Problem Relation Age of Onset   Depression Mother    Stroke Mother    Fibromyalgia Mother    Osteoarthritis Mother    Asthma Brother    Depression Brother    Migraines Brother    Asperger's syndrome Brother    Other Brother        Ehlers-Danlos Syndrome   Cancer Maternal Aunt    Asperger's syndrome Maternal Aunt    Breast cancer Maternal Aunt    Heart disease Paternal Aunt    Heart disease Paternal Uncle    Cervical cancer Maternal Grandmother    Osteoporosis Maternal Grandmother        Died at 15   Osteoarthritis Maternal Grandmother    Colon cancer Maternal Grandfather    Asthma  Maternal Grandfather    Asperger's syndrome Maternal Grandfather    Emphysema Maternal Grandfather        Died at 79   Prostate cancer Maternal Grandfather    Heart attack Paternal Grandfather        Died at 70   Asthma Paternal Grandfather    Tuberculosis Paternal Grandfather    Cancer Cousin    Asperger's syndrome Cousin    Asperger's syndrome Other    Heart Problems Paternal 93        Died at 42   Ehlers-Danlos syndrome Father    Ehlers-Danlos syndrome Brother     Social History Social History   Tobacco Use   Smoking status: Never   Smokeless tobacco: Never  Vaping Use   Vaping Use: Never used  Substance Use Topics   Alcohol use: No    Alcohol/week: 0.0 standard drinks   Drug use: No    Review of Systems  Constitutional: No fever/chills Eyes: No visual changes. ENT: No sore throat. Cardiovascular: Denies chest pain. Respiratory: Denies shortness of breath. Gastrointestinal: No abdominal pain.  No nausea, no vomiting.  No diarrhea.  No constipation. Genitourinary: Negative for dysuria. Musculoskeletal: Negative for back pain. Skin: Negative for rash. Neurological: Positive for decreased LOC.  Negative for headaches, focal weakness or numbness. Psychiatric: Positive for catatonia.  ____________________________________________   PHYSICAL EXAM:  VITAL SIGNS: ED Triage Vitals  Enc Vitals Group     BP      Pulse      Resp      Temp      Temp src      SpO2      Weight      Height      Head Circumference      Peak Flow      Pain Score      Pain Loc      Pain Edu?      Excl. in [place]?     Constitutional: Eyes open, staring, not responsive.  Thin, pale appearing and in no acute distress. Eyes: Conjunctivae are normal. PERRL. EOMI. Head: Atraumatic. Nose: Atraumatic. Mouth/Throat: Mucous membranes are mildly dry. Neck: No stridor.   Cardiovascular: Tachycardic rate, regular rhythm. Grossly normal heart sounds.  Good peripheral  circulation. Respiratory: Normal respiratory effort.  No retractions. Lungs CTAB. Gastrointestinal: Soft and nontender to light or  deep palpation. No distention. No abdominal bruits. No CVA tenderness. Musculoskeletal: No lower extremity tenderness nor edema.  No joint effusions. Neurologic: Eyes open, staring, does not follow commands.  BUE straightened, stiff with fists clenched.  BLE bent at the knees.   Skin:  Skin is warm, diaphoretic and intact. No rash noted. Psychiatric: Catatonia.  ____________________________________________   LABS (all labs ordered are listed, but only abnormal results are displayed)  Labs Reviewed  RESP PANEL BY RT-PCR (FLU A&B, COVID) ARPGX2  CBC WITH DIFFERENTIAL/PLATELET  COMPREHENSIVE METABOLIC PANEL  ACETAMINOPHEN LEVEL  SALICYLATE LEVEL  LACTIC ACID, PLASMA  LACTIC ACID, PLASMA  ETHANOL  URINALYSIS, COMPLETE (UACMP) WITH MICROSCOPIC  URINE DRUG SCREEN, QUALITATIVE ([facility] ONLY)  AMMONIA  TROPONIN I (HIGH SENSITIVITY)   ____________________________________________  EKG  ED ECG REPORT I, Tyauna Lacaze J, the attending physician, personally viewed and interpreted this ECG.   Date: 04/22/2021  EKG Time: 0634  Rate: 108  Rhythm: sinus tachycardia  Axis: Normal  Intervals:none  ST&T Change: Nonspecific QTC 436 ____________________________________________  RADIOLOGY I, Davien Malone J, personally viewed and evaluated these images (plain radiographs) as part of my medical decision making, as well as reviewing the written report by the radiologist.  ED MD interpretation: Pending  Official radiology report(s): No results found.  ____________________________________________   PROCEDURES  Procedure(s) performed (including Critical Care):  .1-3 Lead EKG Interpretation Performed by: Paulette Blanch, MD Authorized by: Paulette Blanch, MD     Interpretation: abnormal     ECG rate:  107   ECG rate assessment: tachycardic     Rhythm: sinus  tachycardia     Ectopy: none     Conduction: normal   Comments:     Patient placed on cardiac monitor to evaluate for arrhythmias   ____________________________________________   INITIAL IMPRESSION / ASSESSMENT AND PLAN / ED COURSE  As part of my medical decision making, I reviewed the following data within the electronic MEDICAL RECORD NUMBER History obtained from family, Nursing notes reviewed and incorporated, Labs reviewed, EKG interpreted, Old chart reviewed, Patient signed out to Dr. Joni Fears, Radiograph reviewed, and Notes from prior ED visits     45 year old female presenting with altered mental status. Differential diagnosis includes, but is not limited to, alcohol, illicit or prescription medications, or other toxic ingestion; intracranial pathology such as stroke or intracerebral hemorrhage; fever or infectious causes including sepsis; hypoxemia and/or hypercarbia; uremia; trauma; endocrine related disorders such as diabetes, hypoglycemia, and thyroid-related diseases; hypertensive encephalopathy; postictal state; psychiatric etiology etc.   Will obtain lab work, CT head.  Administer Benztropine and Ativan.  Clinical Course as of 04/22/21 0709  Thu Apr 22, 2021  [id number] Patient unable to straighten arm for lab draw/IV access.  Will administer benztropine and Ativan IM with the hopes to relax her arm in order to proceed with evaluation.  Care will be transferred to the oncoming provider pending lab work, CT imaging studies and disposition. [JS]    Clinical Course User Index [JS] Paulette Blanch, MD     ____________________________________________   FINAL CLINICAL IMPRESSION(S) / ED DIAGNOSES  Final diagnoses:  Altered mental status, unspecified altered mental status type  Catatonia     ED Discharge Orders     None        Note:  This document was prepared using Dragon voice recognition software and may include unintentional dictation errors.    Paulette Blanch,  MD 04/22/21 [phone]

## 2021-04-23 DIAGNOSIS — G9341 Metabolic encephalopathy: Secondary | ICD-10-CM

## 2021-04-23 LAB — CBC
HCT: 36 % (ref 36.0–46.0)
Hemoglobin: 12.4 g/dL (ref 12.0–15.0)
MCH: 29.6 pg (ref 26.0–34.0)
MCHC: 34.4 g/dL (ref 30.0–36.0)
MCV: 85.9 fL (ref 80.0–100.0)
Platelets: 241 10*3/uL (ref 150–400)
RBC: 4.19 MIL/uL (ref 3.87–5.11)
RDW: 14.2 % (ref 11.5–15.5)
WBC: 9.1 10*3/uL (ref 4.0–10.5)
nRBC: 0 % (ref 0.0–0.2)

## 2021-04-23 LAB — HIV ANTIBODY (ROUTINE TESTING W REFLEX): HIV Screen 4th Generation wRfx: NONREACTIVE

## 2021-04-23 LAB — GLUCOSE, CAPILLARY: Glucose-Capillary: 101 mg/dL — ABNORMAL HIGH (ref 70–99)

## 2021-04-23 MED ORDER — HALOPERIDOL 1 MG PO TABS
1.0000 mg | ORAL_TABLET | Freq: Two times a day (BID) | ORAL | Status: DC
  Administered 2021-04-23 – 2021-04-24 (×3): 1 mg via ORAL
  Filled 2021-04-23 (×4): qty 1

## 2021-04-23 MED ORDER — MENTHOL 3 MG MT LOZG
1.0000 | LOZENGE | OROMUCOSAL | Status: DC | PRN
  Administered 2021-04-23: 3 mg via ORAL
  Filled 2021-04-23 (×2): qty 9

## 2021-04-23 NOTE — Progress Notes (Signed)
PROGRESS NOTE    Karen [name]  [id number] DOB: Nov 10, 1975 DOA: 04/22/2021 PCP: Birdie Sons, MD   Chief Complain: AMS  Brief Narrative:  Patient is a 45 year old female with history of paranoid psychosis, asthma, OSA tics of organic   origin, Ehlers-Danlos syndrome, fibromyalgia, headache urinary retention with Foley catheter, seizure, remote arthritis, TBI, wheelchair-bound who presented with altered mental status from home.  She lives with her mother.  She was found to be confused, hallucinating.  She was found to have whole body rigidity, catatonia, she was unresponsive.  She was recently hospitalized at [facility] psychiatry for paranoia and hallucinations.  On presentation she was found to have leukocytosis, UA suspicious for UTI with leukocytes and bacteria.  CT head negative for acute intracranial abnormalities. In the emergency department, she was given 2 mg of Ativan, 2 mg of benztropine with improvement in the mental status.  Psychiatry is following.  Currently being treated for UTI, urine culture pending.    Assessment & Plan:   Principal Problem:   Acute metabolic encephalopathy Active Problems:   Tics of organic origin   Depression with anxiety   Seizures (HCC)   Asthma   Paranoid ([place])   Psychosis ([place])   UTI (urinary tract infection)   Sepsis ([place])   Urinary retention   Altered mental status: Likely secondary to underlying psychiatric problems versus concurrent UTI.  She was found to be confused, hallucinating.  She was found to have whole body rigidity, catatonia, she was unresponsive.  She was recently hospitalized at [facility] psychiatry for paranoia and hallucinations.  CT head did not show any acute intracranial normalities.  Neurology, psychiatry following. Mental status has improved after she was given Ativan, benztropine.  EEG was declined by patient.  Psychiatry signed off. Currently she is alert and oriented.  Suspected UTI: Presented with leukocytosis,is  afebrile.  She was tachycardic on presentation.  Lactate is normal.  Started on ceftriaxone.  Procalcitonin negative.  Follow-up urine culture.  Leukocytosis has resolved.  Chronic urinary retention: Foley catheter.  On Flomax  Seizure disorder: Currently not taking any meds.  EEG. Declined by patient.  Neurology signed off.  Continue as needed medications for seizures if needed.  Tics of organic origin: On Haldol at home, currently on hold  Depression/anxiety/paranoid psychosis: Management as per psychiatry.  Currently on buspirone, Cymbalta, gabapentin, Ativan as needed, pregabalin , Seroquel.  Haldol  on hold  History of asthma: Currently stable.  Not in exacerbation.  Continue bronchodilators as needed.         DVT prophylaxis: Lovenox Code Status: Full code Family Communication: Called and discussed with mother on phone on 04/23/2021 Status is: Inpatient  Remains inpatient appropriate because:Unsafe d/c plan  Dispo: The patient is from: Home              Anticipated d/c is to: Home              Patient currently is not medically stable to d/c.   Difficult to place patient No     Consultants: Psychiatry  Procedures: None  Antimicrobials:  Anti-infectives (From admission, onward)    Start     Dose/Rate Route Frequency Ordered Stop   04/23/21 0800  cefTRIAXone (ROCEPHIN) 1 g in sodium chloride 0.9 % 100 mL IVPB        1 g 200 mL/hr over 30 Minutes Intravenous Every 24 hours 04/22/21 1959     04/22/21 0845  cefTRIAXone (ROCEPHIN) 2 g in sodium chloride 0.9 %  100 mL IVPB        2 g 200 mL/hr over 30 Minutes Intravenous  Once 04/22/21 [id number] 04/22/21 1040       Subjective:  Patient seen and examined at the bedside this morning.  Hemodynamically stable during evaluation.  Comfortable.  Alert and oriented.  She was asking for her Haldol to be restarted.  Denies any abdomen pain  Objective: Vitals:   04/22/21 1519 04/22/21 1951 04/22/21 2301 04/23/21 0437  BP: 120/78  124/83  (!) 93/58  Pulse: 84 91 89 67  Resp: '20 18  18  '$ Temp: 98.8 F (37.1 C) 98 F (36.7 C)  98 F (36.7 C)  TempSrc: Oral     SpO2: 96% 96% 100% 100%  Weight:      Height:        Intake/Output Summary (Last 24 hours) at 04/23/2021 0753 Last data filed at 04/23/2021 0432 Gross per 24 hour  Intake 1000 ml  Output 1750 ml  Net -750 ml   Filed Weights   04/22/21 0639  Weight: 59.9 kg    Examination:  General exam: Overall comfortable, not in distress HEENT: PERRL Respiratory system:  no wheezes or crackles  Cardiovascular system: S1 & S2 heard, RRR.  Gastrointestinal system: Abdomen is nondistended, soft and nontender. Central nervous system: Alert and oriented Extremities: No edema, no clubbing ,no cyanosis Skin: No rashes, no ulcers,no icterus   GU: foley     Data Reviewed: I have personally reviewed following labs and imaging studies  CBC: Recent Labs  Lab 04/22/21 0755 04/23/21 0624  WBC 16.5* 9.1  NEUTROABS 14.3*  --   HGB 15.3* 12.4  HCT 44.2 36.0  MCV 84.5 85.9  PLT 311 [id number]   Basic Metabolic Panel: Recent Labs  Lab 04/22/21 0755  NA 138  K 3.7  CL 104  CO2 23  GLUCOSE 141*  BUN 12  CREATININE 0.64  CALCIUM 9.3   GFR: Estimated Creatinine Clearance: 70.7 mL/min (by C-G formula based on SCr of 0.64 mg/dL). Liver Function Tests: Recent Labs  Lab 04/22/21 0755  AST 18  ALT 12  ALKPHOS 75  BILITOT 0.7  PROT 7.7  ALBUMIN 4.1   No results for input(s): LIPASE, AMYLASE in the last 168 hours. Recent Labs  Lab 04/22/21 0755  AMMONIA 22   Coagulation Profile: No results for input(s): INR, PROTIME in the last 168 hours. Cardiac Enzymes: No results for input(s): CKTOTAL, CKMB, CKMBINDEX, TROPONINI in the last 168 hours. BNP (last 3 results) No results for input(s): PROBNP in the last 8760 hours. HbA1C: No results for input(s): HGBA1C in the last 72 hours. CBG: Recent Labs  Lab 04/22/21 0725  GLUCAP 141*   Lipid Profile: No  results for input(s): CHOL, HDL, LDLCALC, TRIG, CHOLHDL, LDLDIRECT in the last 72 hours. Thyroid Function Tests: No results for input(s): TSH, T4TOTAL, FREET4, T3FREE, THYROIDAB in the last 72 hours. Anemia Panel: No results for input(s): VITAMINB12, FOLATE, FERRITIN, TIBC, IRON, RETICCTPCT in the last 72 hours. Sepsis Labs: Recent Labs  Lab 04/22/21 0755 04/22/21 0911  PROCALCITON  --  <0.10  LATICACIDVEN 1.6  --     Recent Results (from the past 240 hour(s))  Resp Panel by RT-PCR (Flu A&B, Covid)     Status: None   Collection Time: 04/22/21  7:55 AM   Specimen: Nasopharyngeal(NP) swabs in vial transport medium  Result Value Ref Range Status   SARS Coronavirus 2 by RT PCR NEGATIVE NEGATIVE Final    Comment: (  NOTE) SARS-CoV-2 target nucleic acids are NOT DETECTED.  The SARS-CoV-2 RNA is generally detectable in upper respiratory specimens during the acute phase of infection. The lowest concentration of SARS-CoV-2 viral copies this assay can detect is 138 copies/mL. A negative result does not preclude SARS-Cov-2 infection and should not be used as the sole basis for treatment or other patient management decisions. A negative result may occur with  improper specimen collection/handling, submission of specimen other than nasopharyngeal swab, presence of viral mutation(s) within the areas targeted by this assay, and inadequate number of viral copies(<138 copies/mL). A negative result must be combined with clinical observations, patient history, and epidemiological information. The expected result is Negative.  Fact Sheet for Patients:  EntrepreneurPulse.com.au  Fact Sheet for Healthcare Providers:  IncredibleEmployment.be  This test is no t yet approved or cleared by the [place] FDA and  has been authorized for detection and/or diagnosis of SARS-CoV-2 by FDA under an Emergency Use Authorization (EUA). This EUA will remain  in effect  (meaning this test can be used) for the duration of the COVID-19 declaration under Section 564(b)(1) of the Act, 21 U.S.C.section 360bbb-3(b)(1), unless the authorization is terminated  or revoked sooner.       Influenza A by PCR NEGATIVE NEGATIVE Final   Influenza B by PCR NEGATIVE NEGATIVE Final    Comment: (NOTE) The Xpert Xpress SARS-CoV-2/FLU/RSV plus assay is intended as an aid in the diagnosis of influenza from Nasopharyngeal swab specimens and should not be used as a sole basis for treatment. Nasal washings and aspirates are unacceptable for Xpert Xpress SARS-CoV-2/FLU/RSV testing.  Fact Sheet for Patients: EntrepreneurPulse.com.au  Fact Sheet for Healthcare Providers: IncredibleEmployment.be  This test is not yet approved or cleared by the [place] FDA and has been authorized for detection and/or diagnosis of SARS-CoV-2 by FDA under an Emergency Use Authorization (EUA). This EUA will remain in effect (meaning this test can be used) for the duration of the COVID-19 declaration under Section 564(b)(1) of the Act, 21 U.S.C. section 360bbb-3(b)(1), unless the authorization is terminated or revoked.  Performed at [facility], [address], [place], [place] [address]          Radiology Studies: CT ABDOMEN PELVIS WO CONTRAST  Result Date: 04/22/2021 CLINICAL DATA:  Altered mental status. Abdominal pain. EXAM: CT ABDOMEN AND PELVIS WITHOUT CONTRAST TECHNIQUE: Multidetector CT imaging of the abdomen and pelvis was performed following the standard protocol without IV contrast. COMPARISON:  11/21/2012 FINDINGS: Lower chest: The lung bases are clear of an acute pulmonary process. No pleural effusions or pulmonary lesions. The heart is normal in size. No pericardial effusion. Hepatobiliary: No hepatic lesions are identified without contrast. No intrahepatic biliary dilatation. The gallbladder is grossly normal. No common bile  duct dilatation. Pancreas: No mass, inflammation or ductal dilatation. Spleen: Normal size. No focal lesions. Adrenals/Urinary Tract: Adrenal glands and kidneys are unremarkable. The left kidney demonstrates moderate renal cortical scarring changes. Marked distention of the bladder is noted. It extends well above the umbilicus. Possible bladder outlet obstruction. No bladder wall thickening or bladder mass. No bladder calculi. Stomach/Bowel: The stomach, duodenum and small bowel are grossly normal without oral contrast. No inflammatory changes or obstructive findings. There is moderate stool throughout the colon and down into the rectum where there is moderate fecal impaction and mild rectal wall thickening. Associated mild presacral edema. Vascular/Lymphatic: Scattered aortic calcifications but no aneurysm. No mesenteric or retroperitoneal mass or adenopathy. Reproductive: The uterus and ovaries are unremarkable. Other:  No pelvic mass or adenopathy. No free pelvic fluid collections. No inguinal mass or adenopathy. No abdominal wall hernia or subcutaneous lesions. Musculoskeletal: No significant bony findings. IMPRESSION: 1. Moderate stool throughout the colon and down into the rectum where there is moderate fecal impaction. 2. Distended bladder but no wall thickening or mass. 3. Moderate left renal cortical scarring changes. 4. No other significant abdominal/pelvic findings, mass lesions or adenopathy. 5. Aortic atherosclerosis. Aortic Atherosclerosis (ICD10-I70.0). Electronically Signed   By: Marijo Sanes M.D.   On: 04/22/2021 09:22   CT Head Wo Contrast  Result Date: 04/22/2021 CLINICAL DATA:  45 year old female with altered mental status. History of Ehlers-Danlos syndrome, EXAM: CT HEAD WITHOUT CONTRAST TECHNIQUE: Contiguous axial images were obtained from the base of the skull through the vertex without intravenous contrast. COMPARISON:  Paranasal sinus CT 1226. FINDINGS: Brain: No midline shift,  ventriculomegaly, mass effect, evidence of mass lesion, intracranial hemorrhage or evidence of cortically based acute infarction. Gray-white matter differentiation is within normal limits throughout the brain. Cerebral volume is within normal limits. Vascular: Mild Calcified atherosclerosis at the skull base. No suspicious intracranial vascular hyperdensity. Skull: Negative. Sinuses/Orbits: Mild bubbly opacity in the left sphenoid sinus and posterior right ethmoid mucosal thickening. But other Visualized paranasal sinuses and mastoids are clear. Other: Leftward gaze deviation. Visualized scalp soft tissues are within normal limits. IMPRESSION: 1. Normal noncontrast CT appearance of the brain. 2. Mild paranasal sinus inflammation. Electronically Signed   By: Genevie Ann M.D.   On: 04/22/2021 08:52   DG Chest Port 1 View  Result Date: 04/22/2021 CLINICAL DATA:  Altered mental status. Decreased level of consciousness. EXAM: PORTABLE CHEST 1 VIEW COMPARISON:  Chest x-ray 01/29/2021 FINDINGS: The cardiac silhouette, mediastinal hilar contours are within normal limits and stable. The lungs are clear. No pleural effusions pulmonary lesions. No pneumothorax. The bony thorax is intact. IMPRESSION: No acute cardiopulmonary findings. Electronically Signed   By: Marijo Sanes M.D.   On: 04/22/2021 07:41        Scheduled Meds:  busPIRone  10 mg Oral BID   busPIRone  15 mg Oral BID   celecoxib  200 mg Oral BID   diclofenac Sodium  2 g Topical TID   DULoxetine  120 mg Oral Daily   enoxaparin (LOVENOX) injection  40 mg Subcutaneous Q24H   fentaNYL  1 patch Transdermal Q72H   fluticasone  1 spray Each Nare Daily   gabapentin  1,200 mg Oral Daily   gabapentin  1,800 mg Oral QHS   ketotifen  1 drop Both Eyes BID   loratadine  10 mg Oral Daily   mometasone-formoterol  2 puff Inhalation BID   pantoprazole  40 mg Oral Daily   pregabalin  300 mg Oral BID   QUEtiapine  200 mg Oral QHS   tamsulosin  0.4 mg Oral Daily    Continuous Infusions:  cefTRIAXone (ROCEPHIN)  IV       LOS: 1 day    Time spent: 35 mins.More than 50% of that time was spent in counseling and/or coordination of care.      Shelly Coss, MD [facility] P9/04/2021, 7:53 AM

## 2021-04-23 NOTE — Plan of Care (Signed)
Discussed with Dr. Tawanna Solo --Per review of records this is a patient with longstanding psychiatric history has also been followed by neurology for tics with low concern for seizure disorder based on extensive work-up.  She most recently was admitted in late August to [facility] at which time an extensive work-up was pursued again, with prolonged EEG monitoring notable for focal delta slowing seen frequently over the right temporal region and rarely over the left temporal region without epileptiform discharges or seizures seen (noted to be nonspecific for etiology but can be seen in a postictal state).  Serum autoimmune encephalitis panel, HIV and RPR all resulted negative as did MRI brain with and without contrast completed 04/15/2021.  Please note she has also been followed by Dr. Gaynell Face on an outpatient basis for decades and he notes that she has had seizure-like events in the past proven nonepileptic by EEGs capturing the spells.  Patient presented with extra pyramidal symptoms felt to be potentially related to increasing her antipsychotic dosing at this hospitalization.  She was treated appropriately and her mental status improved.  She refused EEG monitoring for my technician.  Discussed with Dr. Tawanna Solo that if patient has returned to her baseline I do not think repeat neurological evaluation/work-up during this hospitalization will be useful but I am certainly available if new questions or concerns arise or if the patient is worsening or not improving  Lesleigh Noe MD-PhD [facility] [phone] [facility] coverage for [facility] is from 8 AM to 4 AM in-house and 4 PM to 8 PM by telephone/video. 8 PM to 8 AM emergent questions or overnight urgent questions should be addressed to Teleneurology On-call or [facility] neurohospitalist; contact information can be found on AMION

## 2021-04-23 NOTE — Consult Note (Signed)
[facility] Consult   Reason for Consult: Follow-up consult for 45 year old woman with a history of multiple medical problems who presented to the hospital with altered mental status Referring Physician:  Adhikari Patient Identification: Karen [name] MRN:  [id number] Principal Diagnosis: Acute metabolic encephalopathy Diagnosis:  Principal Problem:   Acute metabolic encephalopathy Active Problems:   Tics of organic origin   Depression with anxiety   Seizures ([place])   Asthma   Paranoid ([place])   Psychosis ([place])   UTI (urinary tract infection)   Sepsis ([place])   Urinary retention   Total Time spent with patient: 1 hour  Subjective:   Karen [name] is a 45 y.o. female patient admitted with "I was not having hallucinations".  HPI: Patient seen chart reviewed.  Spoke with the patient and her mother including speaking with the mother outside the room.  Patient was awake today.  Tired and sluggish but still communicative.  Patient states her mood is feeling fine.  She denies having any hallucinations.  I reviewed with the patient the notes documenting her presentation to the hospital and she dismissed most of it insisting that she had not been catatonic and insisting that she had not been having any catatonia or hallucinations at home.  Patient insists that at no point was she hearing or seeing things.  She seems very invested in insisting that she is not having mental health symptoms.  Patient denies suicidal or homicidal thought.  I spoke with her mother outside of the room who tells me that the patient's current presentation is very different from her baseline.  She thinks her current behavior is irritable and guarded in a manner that is inappropriate.  She says the patient has been admitting to hallucinations when she is with her alone but will not admit it to staff.  We reviewed the recent history at [facility] and the fact that all of these symptoms seem to have just started  about 3 weeks ago with no clear precipitant  Past Psychiatric History: Patient has a long history of impairment due to history of traumatic brain injuries and multiple medical problems.  Nevertheless mother insists that the patient had never had significant psychiatric symptoms until 3 weeks ago.  Recent hospitalization at [facility] she was discharged on antidepressant and low-dose antipsychotic medicine  Risk to Self:   Risk to Others:   Prior Inpatient Therapy:   Prior Outpatient Therapy:    Past Medical History:  Past Medical History:  Diagnosis Date  . Ehlers-Danlos syndrome   . Fibromyalgia   . Headache   . Sleep apnea   . Tics of organic origin   . Transient alteration of awareness     Past Surgical History:  Procedure Laterality Date  . COLONOSCOPY WITH PROPOFOL N/A 02/13/2018   Procedure: COLONOSCOPY WITH PROPOFOL;  Surgeon: [name], MD;  Location: [facility] ENDOSCOPY;  Service: Endoscopy;  Laterality: N/A;  . DILATION AND CURETTAGE OF UTERUS  2009  . ESOPHAGOGASTRODUODENOSCOPY (EGD) WITH PROPOFOL  02/13/2018   Procedure: ESOPHAGOGASTRODUODENOSCOPY (EGD) WITH PROPOFOL;  Surgeon: [name], MD;  Location: [facility] ENDOSCOPY;  Service: Endoscopy;;  . EYE SURGERY Left 1990   3 Surgeries on left eye to correct crossed eye  . LAPAROSCOPY Left 2003   Fallopian Tube  . MANDIBLE SURGERY  1998  . OTHER SURGICAL HISTORY Right 1987   3 Surgeries to repair broken arm  . OTHER SURGICAL HISTORY Left    3 surgeries on her left thigh as an infant  .  OTHER SURGICAL HISTORY  1998   Jaw surgery  . Right arm surgery  1987   x 3 due to fracture  . TOENAIL EXCISION Bilateral 06/13/2019   Procedure: BILATERAL SECOND TOE PARTIAL NAIL ABLATION;  Surgeon: Erle Crocker, MD;  Location: [facility];  Service: Orthopedics;  Laterality: Bilateral;  SURGERY REQUEST TIME 1 HOUR  . TONSILLECTOMY  12/13/2002   Family History:  Family History  Problem Relation Age of Onset  . Depression  Mother   . Stroke Mother   . Fibromyalgia Mother   . Osteoarthritis Mother   . Asthma Brother   . Depression Brother   . Migraines Brother   . Asperger's syndrome Brother   . Other Brother        Ehlers-Danlos Syndrome  . Cancer Maternal Aunt   . Asperger's syndrome Maternal Aunt   . Breast cancer Maternal Aunt   . Heart disease Paternal Aunt   . Heart disease Paternal Uncle   . Cervical cancer Maternal Grandmother   . Osteoporosis Maternal Grandmother        Died at 59  . Osteoarthritis Maternal Grandmother   . Colon cancer Maternal Grandfather   . Asthma Maternal Grandfather   . Asperger's syndrome Maternal Grandfather   . Emphysema Maternal Grandfather        Died at 52  . Prostate cancer Maternal Grandfather   . Heart attack Paternal Grandfather        Died at 28  . Asthma Paternal Grandfather   . Tuberculosis Paternal Grandfather   . Cancer Cousin   . Asperger's syndrome Cousin   . Asperger's syndrome Other   . Heart Problems Paternal Grandmother        Died at 56  . Ehlers-Danlos syndrome Father   . Ehlers-Danlos syndrome Brother    Family Psychiatric  History: None reported Social History:  Social History   Substance and Sexual Activity  Alcohol Use No  . Alcohol/week: 0.0 standard drinks     Social History   Substance and Sexual Activity  Drug Use No    Social History   Socioeconomic History  . Marital status: Single    Spouse name: Not on file  . Number of children: Not on file  . Years of education: Not on file  . Highest education level: Not on file  Occupational History  . Not on file  Tobacco Use  . Smoking status: Never  . Smokeless tobacco: Never  Vaping Use  . Vaping Use: Never used  Substance and Sexual Activity  . Alcohol use: No    Alcohol/week: 0.0 standard drinks  . Drug use: No  . Sexual activity: Never  Other Topics Concern  . 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    She lives with her  mother.    She enjoys reading, watching TV, writing, and painting.    Social Determinants of Health   Financial Resource Strain: Not on file  Food Insecurity: Not on file  Transportation Needs: Not on file  Physical Activity: Not on file  Stress: Not on file  Social Connections: Not on file   Additional Social History:    Allergies:   Allergies  Allergen Reactions  . Augmentin  [Amoxicillin-Pot Clavulanate] Diarrhea  . Chocolate Flavor     Other reaction(s): Other (See Comments) Wheezing, acne  . Demerol [Meperidine] Other (See Comments)    Slow to wake up when this drug is  given.   . Keflex [Cephalexin] Nausea And Vomiting  . Morphine And Related Nausea And Vomiting  . Tape Dermatitis    Must use paper tape only  . Toradol [Ketorolac Tromethamine] Nausea And Vomiting  . Chocolate     GI distress  . Nsaids     patient develops ulcers  . Strawberry Extract     GI distress    Labs:  Results for orders placed or performed during the hospital encounter of 04/22/21 (from the past 48 hour(s))  CBG monitoring, ED     Status: Abnormal   Collection Time: 04/22/21  7:25 AM  Result Value Ref Range   Glucose-Capillary 141 (H) 70 - 99 mg/dL    Comment: Glucose reference range applies only to samples taken after fasting for at least 8 hours.  CBC with Differential     Status: Abnormal   Collection Time: 04/22/21  7:55 AM  Result Value Ref Range   WBC 16.5 (H) 4.0 - 10.5 K/uL   RBC 5.23 (H) 3.87 - 5.11 MIL/uL   Hemoglobin 15.3 (H) 12.0 - 15.0 g/dL   HCT 44.2 36.0 - 46.0 %   MCV 84.5 80.0 - 100.0 fL   MCH 29.3 26.0 - 34.0 pg   MCHC 34.6 30.0 - 36.0 g/dL   RDW 14.0 11.5 - 15.5 %   Platelets 311 150 - 400 K/uL   nRBC 0.0 0.0 - 0.2 %   Neutrophils Relative % 87 %   Neutro Abs 14.3 (H) 1.7 - 7.7 K/uL   Lymphocytes Relative 7 %   Lymphs Abs 1.1 0.7 - 4.0 K/uL   Monocytes Relative 6 %   Monocytes Absolute 0.9 0.1 - 1.0 K/uL   Eosinophils Relative 0 %   Eosinophils Absolute  0.0 0.0 - 0.5 K/uL   Basophils Relative 0 %   Basophils Absolute 0.0 0.0 - 0.1 K/uL   Immature Granulocytes 0 %   Abs Immature Granulocytes 0.06 0.00 - 0.07 K/uL    Comment: Performed at [facility], [address] [place], [place] [address]  Comprehensive metabolic panel     Status: Abnormal   Collection Time: 04/22/21  7:55 AM  Result Value Ref Range   Sodium 138 135 - 145 mmol/L   Potassium 3.7 3.5 - 5.1 mmol/L   Chloride 104 98 - 111 mmol/L   CO2 23 22 - 32 mmol/L   Glucose, Bld 141 (H) 70 - 99 mg/dL    Comment: Glucose reference range applies only to samples taken after fasting for at least 8 hours.   BUN 12 6 - 20 mg/dL   Creatinine, Ser 0.64 0.44 - 1.00 mg/dL   Calcium 9.3 8.9 - 10.3 mg/dL   Total Protein 7.7 6.5 - 8.1 g/dL   Albumin 4.1 3.5 - 5.0 g/dL   AST 18 15 - 41 U/L   ALT 12 0 - 44 U/L   Alkaline Phosphatase 75 38 - 126 U/L   Total Bilirubin 0.7 0.3 - 1.2 mg/dL   GFR, Estimated >60 >60 mL/min    Comment: (NOTE) Calculated using the CKD-EPI Creatinine Equation (2021)    Anion gap 11 5 - 15    Comment: Performed at [facility], [address], [place], [place] [address]  Acetaminophen level     Status: Abnormal   Collection Time: 04/22/21  7:55 AM  Result Value Ref Range   Acetaminophen (Tylenol), Serum <10 (L) 10 - 30 ug/mL    Comment: (NOTE) Therapeutic concentrations vary  significantly. A range of 10-30 ug/mL  may be an effective concentration for many patients. However, some  are best treated at concentrations outside of this range. Acetaminophen concentrations >150 ug/mL at 4 hours after ingestion  and >50 ug/mL at 12 hours after ingestion are often associated with  toxic reactions.  Performed at [facility]: [address], [address] [place], [place] [id number]   Salicylate level     Status: Abnormal   Collection Time: 04/22/21  7:55 AM  Result Value Ref Range   Salicylate Lvl [id number] (L) 7.0 - 30.0 mg/dL    Comment: Performed at  [facility], [address] [place], [place] [address]  Lactic acid, plasma     Status: None   Collection Time: 04/22/21  7:55 AM  Result Value Ref Range   Lactic Acid, Venous 1.6 0.5 - 1.9 mmol/L    Comment: Performed at [facility], [address] [place], [place] [address]  Ethanol     Status: None   Collection Time: 04/22/21  7:55 AM  Result Value Ref Range   Alcohol, Ethyl (B) <10 <10 mg/dL    Comment: (NOTE) Lowest detectable limit for serum alcohol is 10 mg/dL.  For medical purposes only. Performed at [facility], [place], [place] [address]   Troponin I (High Sensitivity)     Status: None   Collection Time: 04/22/21  7:55 AM  Result Value Ref Range   Troponin I (High Sensitivity) 5 <18 ng/L    Comment: (NOTE) Elevated high sensitivity troponin I (hsTnI) values and significant  changes across serial measurements may suggest ACS but many other  chronic and acute conditions are known to elevate hsTnI results.  Refer to the "Links" section for chest pain algorithms and additional  guidance. Performed at [facility], [address] [place], [place] [address]   Urinalysis, Complete w Microscopic     Status: Abnormal   Collection Time: 04/22/21  7:55 AM  Result Value Ref Range   Color, Urine YELLOW YELLOW   APPearance CLEAR (A) CLEAR   Specific Gravity, Urine 1.015 1.005 - 1.030   pH 7.0 5.0 - 8.0   Glucose, UA NEGATIVE NEGATIVE mg/dL   Hgb urine dipstick NEGATIVE NEGATIVE   Bilirubin Urine NEGATIVE NEGATIVE   Ketones, ur NEGATIVE NEGATIVE mg/dL   Protein, ur NEGATIVE NEGATIVE mg/dL   Nitrite NEGATIVE NEGATIVE   Leukocytes,Ua SMALL (A) NEGATIVE   RBC / HPF 0-5 0 - 5 RBC/hpf   WBC, UA 11-20 0 - 5 WBC/hpf   Bacteria, UA MANY (A) NONE SEEN   Squamous Epithelial / LPF 0-5 0 - 5   Mucus PRESENT     Comment: Performed at [facility], [address], [place], [place] [address]  Urine Drug Screen,  Qualitative     Status: Abnormal   Collection Time: 04/22/21  7:55 AM  Result Value Ref Range   Tricyclic, Ur Screen POSITIVE (A) NONE DETECTED   Amphetamines, Ur Screen POSITIVE (A) NONE DETECTED   MDMA (Ecstasy)Ur Screen NONE DETECTED NONE DETECTED   Cocaine Metabolite,Ur [place] NONE DETECTED NONE DETECTED   Opiate, Ur Screen NONE DETECTED NONE DETECTED   Phencyclidine (PCP) Ur S NONE DETECTED NONE DETECTED   Cannabinoid 50 Ng, Ur [place] NONE DETECTED NONE DETECTED   Barbiturates, Ur Screen NONE DETECTED NONE DETECTED   Benzodiazepine, Ur Scrn NONE DETECTED NONE DETECTED   Methadone Scn, Ur NONE DETECTED NONE DETECTED    Comment: (NOTE) Tricyclics +  metabolites, urine    Cutoff 1000 ng/mL Amphetamines + metabolites, urine  Cutoff 1000 ng/mL MDMA (Ecstasy), urine              Cutoff 500 ng/mL Cocaine Metabolite, urine          Cutoff 300 ng/mL Opiate + metabolites, urine        Cutoff 300 ng/mL Phencyclidine (PCP), urine         Cutoff 25 ng/mL Cannabinoid, urine                 Cutoff 50 ng/mL Barbiturates + metabolites, urine  Cutoff 200 ng/mL Benzodiazepine, urine              Cutoff 200 ng/mL Methadone, urine                   Cutoff 300 ng/mL  The urine drug screen provides only a preliminary, unconfirmed analytical test result and should not be used for non-medical purposes. Clinical consideration and professional judgment should be applied to any positive drug screen result due to possible interfering substances. A more specific alternate chemical method must be used in order to obtain a confirmed analytical result. Gas chromatography / mass spectrometry (GC/MS) is the preferred confirm atory method. Performed at [facility], [address] [place], [place] [address]   Resp Panel by RT-PCR (Flu A&B, Covid)     Status: None   Collection Time: 04/22/21  7:55 AM   Specimen: Nasopharyngeal(NP) swabs in vial transport medium  Result Value Ref Range   SARS Coronavirus 2  by RT PCR NEGATIVE NEGATIVE    Comment: (NOTE) SARS-CoV-2 target nucleic acids are NOT DETECTED.  The SARS-CoV-2 RNA is generally detectable in upper respiratory specimens during the acute phase of infection. The lowest concentration of SARS-CoV-2 viral copies this assay can detect is 138 copies/mL. A negative result does not preclude SARS-Cov-2 infection and should not be used as the sole basis for treatment or other patient management decisions. A negative result may occur with  improper specimen collection/handling, submission of specimen other than nasopharyngeal swab, presence of viral mutation(s) within the areas targeted by this assay, and inadequate number of viral copies(<138 copies/mL). A negative result must be combined with clinical observations, patient history, and epidemiological information. The expected result is Negative.  Fact Sheet for Patients:  EntrepreneurPulse.com.au  Fact Sheet for Healthcare Providers:  IncredibleEmployment.be  This test is no t yet approved or cleared by the [place] FDA and  has been authorized for detection and/or diagnosis of SARS-CoV-2 by FDA under an Emergency Use Authorization (EUA). This EUA will remain  in effect (meaning this test can be used) for the duration of the COVID-19 declaration under Section 564(b)(1) of the Act, 21 U.S.C.section 360bbb-3(b)(1), unless the authorization is terminated  or revoked sooner.       Influenza A by PCR NEGATIVE NEGATIVE   Influenza B by PCR NEGATIVE NEGATIVE    Comment: (NOTE) The Xpert Xpress SARS-CoV-2/FLU/RSV plus assay is intended as an aid in the diagnosis of influenza from Nasopharyngeal swab specimens and should not be used as a sole basis for treatment. Nasal washings and aspirates are unacceptable for Xpert Xpress SARS-CoV-2/FLU/RSV testing.  Fact Sheet for Patients: EntrepreneurPulse.com.au  Fact Sheet for Healthcare  Providers: IncredibleEmployment.be  This test is not yet approved or cleared by the [place] FDA and has been authorized for detection and/or diagnosis of SARS-CoV-2 by FDA under an Emergency Use Authorization (EUA). This EUA  will remain in effect (meaning this test can be used) for the duration of the COVID-19 declaration under Section 564(b)(1) of the Act, 21 U.S.C. section 360bbb-3(b)(1), unless the authorization is terminated or revoked.  Performed at [facility], [address] [place], [place] [address]   Ammonia     Status: None   Collection Time: 04/22/21  7:55 AM  Result Value Ref Range   Ammonia 22 9 - 35 umol/L    Comment: Performed at [facility], [address] [place], [place] [address]  Pregnancy, urine     Status: None   Collection Time: 04/22/21  7:55 AM  Result Value Ref Range   Preg Test, Ur NEGATIVE NEGATIVE    Comment: Performed at [facility], [address], [place], [place] [address]  Urine Culture     Status: Abnormal (Preliminary result)   Collection Time: 04/22/21  7:55 AM   Specimen: Urine, Clean Catch  Result Value Ref Range   Specimen Description      URINE, CLEAN CATCH Performed at [facility], [address], [place], [place] [address]    Special Requests      NONE Performed at [facility], [address], [place], [place] [address]    Culture >=100,000 COLONIES/mL ESCHERICHIA COLI (A)    Report Status PENDING   Culture, blood (routine x 2)     Status: None (Preliminary result)   Collection Time: 04/22/21  9:03 AM   Specimen: BLOOD  Result Value Ref Range   Specimen Description BLOOD  RAC    Special Requests      BOTTLES DRAWN AEROBIC AND ANAEROBIC Blood Culture adequate volume   Culture      NO GROWTH < 24 HOURS Performed at [facility], [address], [place], [place] [address]    Report Status PENDING   Culture, blood (routine x 2)      Status: None (Preliminary result)   Collection Time: 04/22/21  9:03 AM   Specimen: BLOOD  Result Value Ref Range   Specimen Description BLOOD [facility]    Special Requests      BOTTLES DRAWN AEROBIC AND ANAEROBIC Blood Culture adequate volume   Culture      NO GROWTH < 24 HOURS Performed at [facility], [address], [place], [place] [address]    Report Status PENDING   Troponin I (High Sensitivity)     Status: None   Collection Time: 04/22/21  9:11 AM  Result Value Ref Range   Troponin I (High Sensitivity) 2 <18 ng/L    Comment: (NOTE) Elevated high sensitivity troponin I (hsTnI) values and significant  changes across serial measurements may suggest ACS but many other  chronic and acute conditions are known to elevate hsTnI results.  Refer to the "Links" section for chest pain algorithms and additional  guidance. Performed at [facility], [address] [place], [place] [address]   Procalcitonin     Status: None   Collection Time: 04/22/21  9:11 AM  Result Value Ref Range   Procalcitonin <0.10 ng/mL    Comment:        Interpretation: PCT (Procalcitonin) <= 0.5 ng/mL: Systemic infection (sepsis) is not likely. Local bacterial infection is possible. (NOTE)       Sepsis PCT Algorithm           Lower Respiratory Tract  Infection PCT Algorithm    ----------------------------     ----------------------------         PCT < 0.25 ng/mL                PCT < 0.10 ng/mL          Strongly encourage             Strongly discourage   discontinuation of antibiotics    initiation of antibiotics    ----------------------------     -----------------------------       PCT 0.25 - 0.50 ng/mL            PCT 0.10 - 0.25 ng/mL               OR       >80% decrease in PCT            Discourage initiation of                                            antibiotics      Encourage discontinuation           of antibiotics     ----------------------------     -----------------------------         PCT >= 0.50 ng/mL              PCT 0.26 - 0.50 ng/mL               AND        <80% decrease in PCT             Encourage initiation of                                             antibiotics       Encourage continuation           of antibiotics    ----------------------------     -----------------------------        PCT >= 0.50 ng/mL                  PCT > 0.50 ng/mL               AND         increase in PCT                  Strongly encourage                                      initiation of antibiotics    Strongly encourage escalation           of antibiotics                                     -----------------------------                                           PCT <= 0.25 ng/mL  OR                                        > 80% decrease in PCT                                      Discontinue / Do not initiate                                             antibiotics  Performed at [facility], [address] [place], [place] [address]   HIV Antibody (routine testing w rflx)     Status: None   Collection Time: 04/23/21  6:24 AM  Result Value Ref Range   HIV Screen 4th Generation wRfx Non Reactive Non Reactive    Comment: Performed at [facility], [place] [address], [place], [place] [address]  CBC     Status: None   Collection Time: 04/23/21  6:24 AM  Result Value Ref Range   WBC 9.1 4.0 - 10.5 K/uL   RBC 4.19 3.87 - 5.11 MIL/uL   Hemoglobin 12.4 12.0 - 15.0 g/dL   HCT 36.0 36.0 - 46.0 %   MCV 85.9 80.0 - 100.0 fL   MCH 29.6 26.0 - 34.0 pg   MCHC 34.4 30.0 - 36.0 g/dL   RDW 14.2 11.5 - 15.5 %   Platelets 241 150 - 400 K/uL   nRBC 0.0 0.0 - 0.2 %    Comment: Performed at [facility], [address] [place], [place] [address]  Glucose, capillary     Status: Abnormal   Collection Time: 04/23/21  8:04 AM  Result Value Ref Range    Glucose-Capillary 101 (H) 70 - 99 mg/dL    Comment: Glucose reference range applies only to samples taken after fasting for at least 8 hours.    Current Facility-Administered Medications  Medication Dose Route Frequency Provider Last Rate Last Admin  . albuterol (PROVENTIL) (2.5 MG/3ML) 0.083% nebulizer solution 2.5 mg  2.5 mg Nebulization Q4H PRN Ivor Costa, MD      . antiseptic oral rinse (BIOTENE) solution 15 mL  15 mL Mouth Rinse PRN Mansy, Jan A, MD      . busPIRone (BUSPAR) tablet 10 mg  10 mg Oral BID Ivor Costa, MD   10 mg at 04/23/21 0829  . busPIRone (BUSPAR) tablet 15 mg  15 mg Oral BID Ivor Costa, MD   15 mg at 04/23/21 0829  . cefTRIAXone (ROCEPHIN) 1 g in sodium chloride 0.9 % 100 mL IVPB  1 g Intravenous Q24H Ivor Costa, MD   Stopped at 04/23/21 [phone]  . celecoxib (CELEBREX) capsule 200 mg  200 mg Oral BID Ivor Costa, MD   200 mg at 04/23/21 0830  . cyclobenzaprine (FLEXERIL) tablet 5 mg  5 mg Oral TID PRN Ivor Costa, MD      . diclofenac Sodium (VOLTAREN) 1 % topical gel 2 g  2 g Topical TID Ivor Costa, MD   2 g at 04/23/21 1342  . DULoxetine (CYMBALTA) DR capsule 120 mg  120 mg Oral Daily Ivor Costa, MD   120 mg at 04/23/21 0827  . enoxaparin (LOVENOX) injection 40  mg  40 mg Subcutaneous Q24H Ivor Costa, MD      . fentaNYL ([place]) 100 MCG/HR 1 patch  1 patch Transdermal Q72H Ivor Costa, MD   1 patch at 04/22/21 2027  . fluticasone (FLONASE) 50 MCG/ACT nasal spray 1 spray  1 spray Each Nare Daily Ivor Costa, MD   1 spray at 04/23/21 0830  . gabapentin (NEURONTIN) tablet 1,200 mg  1,200 mg Oral Daily Ivor Costa, MD   1,200 mg at 04/23/21 0826  . gabapentin (NEURONTIN) tablet 1,800 mg  1,800 mg Oral QHS Ivor Costa, MD   1,800 mg at 04/22/21 2007  . haloperidol (HALDOL) tablet 1 mg  1 mg Oral BID Shelly Coss, MD   1 mg at 04/23/21 1245  . ketotifen (ZADITOR) 0.025 % ophthalmic solution 1 drop  1 drop Both Eyes BID Ivor Costa, MD   1 drop at 04/23/21 0831  . loratadine  (CLARITIN) tablet 10 mg  10 mg Oral Daily Ivor Costa, MD   10 mg at 04/23/21 0826  . LORazepam (ATIVAN) injection 1 mg  1 mg Intravenous Q2H PRN Ivor Costa, MD      . menthol-cetylpyridinium (CEPACOL) lozenge 3 mg  1 lozenge Oral PRN Shelly Coss, MD   3 mg at 04/23/21 1009  . mometasone-formoterol (DULERA) 200-5 MCG/ACT inhaler 2 puff  2 puff Inhalation BID Ivor Costa, MD   2 puff at 04/23/21 [phone]  . pantoprazole (PROTONIX) EC tablet 40 mg  40 mg Oral Daily Ivor Costa, MD   40 mg at 04/23/21 0826  . pregabalin (LYRICA) capsule 300 mg  300 mg Oral BID Ivor Costa, MD   300 mg at 04/23/21 0826  . QUEtiapine (SEROQUEL) tablet 200 mg  200 mg Oral QHS Ivor Costa, MD   200 mg at 04/22/21 2008  . tamsulosin (FLOMAX) capsule 0.4 mg  0.4 mg Oral Daily Ivor Costa, MD   0.4 mg at 04/23/21 [id number]    Musculoskeletal: Strength & Muscle Tone: decreased Gait & Station: unsteady Patient leans: N/A            Psychiatric Specialty Exam:  Presentation  General Appearance: Appropriate for Environment  Eye Contact:Fleeting  Speech:Clear and Coherent  Speech Volume:Normal  Handedness: No data recorded  Mood and Affect  Mood:Dysphoric (physically ill)  Affect:Blunt; Restricted   Thought Process  Thought Processes: No data recorded Descriptions of Associations:-- (UTA)  Orientation:Partial  Thought Content:No data recorded History of Schizophrenia/Schizoaffective disorder:No data recorded Duration of Psychotic Symptoms:No data recorded Hallucinations:Hallucinations: -- (Does not appear to be responding to internal stimuli)  Ideas of Reference:No data recorded Suicidal Thoughts:No data recorded Homicidal Thoughts:No data recorded  Sensorium  Memory: No data recorded Judgment:Impaired  Insight: No data recorded  Executive Functions  Concentration: No data recorded Attention Span:Poor  Recall:Poor  Fund of Knowledge: No data recorded Language: No data  recorded  Psychomotor Activity  Psychomotor Activity:Psychomotor Activity: Other (comment)   Assets  Assets: No data recorded  Sleep  Sleep: No data recorded  Physical Exam: Physical Exam Vitals and nursing note reviewed.  Constitutional:      Appearance: Normal appearance.  HENT:     Head: Normocephalic and atraumatic.     Mouth/Throat:     Pharynx: Oropharynx is clear.  Eyes:     Pupils: Pupils are equal, round, and reactive to light.  Cardiovascular:     Rate and Rhythm: Normal rate and regular rhythm.  Pulmonary:     Effort: Pulmonary effort is normal.  Breath sounds: Normal breath sounds.  Abdominal:     General: Abdomen is flat.     Palpations: Abdomen is soft.  Musculoskeletal:        General: Normal range of motion.  Skin:    General: Skin is warm and dry.  Neurological:     General: No focal deficit present.     Mental Status: She is alert. Mental status is at baseline.  Psychiatric:        Attention and Perception: She is inattentive.        Mood and Affect: Mood normal. Affect is blunt and inappropriate.        Speech: Speech is tangential.        Behavior: Behavior is slowed.        Thought Content: Thought content is paranoid. Thought content does not include homicidal or suicidal ideation.        Cognition and Memory: Cognition is impaired. Memory is impaired.   Review of Systems  Constitutional: Negative.   HENT: Negative.    Eyes: Negative.   Respiratory: Negative.    Cardiovascular: Negative.   Gastrointestinal: Negative.   Musculoskeletal: Negative.   Skin: Negative.   Neurological: Negative.   Psychiatric/Behavioral:  Negative for depression, hallucinations, substance abuse and suicidal ideas. The patient is not nervous/anxious.   Blood pressure 121/80, pulse 93, temperature 97.7 F (36.5 C), temperature source Oral, resp. rate 16, height '4\' 11"'$  (1.499 m), weight 59.9 kg, SpO2 99 %. Body mass index is 26.67 kg/m.  Treatment Plan  Summary: Plan difficult situation.  Patient probably does not meet commitment criteria.  Reviewed with the mother that psychiatric illness can be potentially treated at home if the patient is not acutely dangerous.  Patient's insight is poor and she is irritable and unwilling to engage in much discussion about mental health treatment.  For now no change to current antidepressant and antipsychotic medication.  Please consult psychiatric team over the weekend for follow-up assessment.  Patient would not be appropriate for our inpatient unit in any case and would be difficult to place if we did try to admit her to the hospital.  Disposition: No evidence of imminent risk to self or others at present.   Supportive therapy provided about ongoing stressors. Discussed crisis plan, support from social network, calling 911, coming to the Emergency Department, and calling Suicide Hotline.  Alethia Berthold, MD 04/23/2021 8:11 PM

## 2021-04-24 DIAGNOSIS — G9341 Metabolic encephalopathy: Secondary | ICD-10-CM | POA: Diagnosis not present

## 2021-04-24 DIAGNOSIS — F418 Other specified anxiety disorders: Secondary | ICD-10-CM

## 2021-04-24 LAB — URINE CULTURE: Culture: >=100000 — AB

## 2021-04-24 LAB — GLUCOSE, CAPILLARY: Glucose-Capillary: 149 mg/dL — ABNORMAL HIGH (ref 70–99)

## 2021-04-24 MED ORDER — CHLORHEXIDINE GLUCONATE CLOTH 2 % EX PADS
6.0000 | MEDICATED_PAD | Freq: Every day | CUTANEOUS | Status: DC
  Administered 2021-04-24: 6 via TOPICAL

## 2021-04-24 MED ORDER — CEFDINIR 300 MG PO CAPS: 300.0000 mg | ORAL_CAPSULE | Freq: Two times a day (BID) | ORAL | Status: DC

## 2021-04-24 MED ORDER — CEFDINIR 300 MG PO CAPS: 300.0000 mg | ORAL_CAPSULE | Freq: Two times a day (BID) | ORAL | 0 refills | Status: AC

## 2021-04-24 NOTE — Progress Notes (Signed)
Pt d/c home via mother. IV removed intact. VSS. All belongings sent with pt. All questions answered.

## 2021-04-24 NOTE — Discharge Summary (Signed)
Physician Discharge Summary  Karen [name] [id number] DOB: 1976/01/16 DOA: 04/22/2021  PCP: Birdie Sons, MD  Admit date: 04/22/2021 Discharge date: 04/24/2021  Admitted From: Home Disposition:  Home  Discharge Condition:Stable CODE STATUS:FULL Diet recommendation: Regular   Brief/Interim Summary:  Patient is a 45 year old female with history of paranoid psychosis, asthma, OSA tics of organic   origin, Ehlers-Danlos syndrome, fibromyalgia, headache urinary retention with Foley catheter, seizure, remote arthritis, TBI, wheelchair-bound who presented with altered mental status from home.  She lives with her mother.  She was found to be confused, hallucinating.  She was found to have whole body rigidity, catatonia, she was unresponsive.  She was recently hospitalized at [facility] psychiatry for paranoia and hallucinations.  On presentation she was found to have leukocytosis, UA suspicious for UTI with leukocytes and bacteria.  CT head negative for acute intracranial abnormalities. In the emergency department, she was given 2 mg of Ativan, 2 mg of benztropine with improvement in the mental status.  Psychiatry was following.  Seroquel started on IV antibiotics for UTI.  Urine culture showed pansensitive E. coli, antibiotics changed to oral.  Psychiatry is not recommending inpatient secondary admission and recommended to continue same medications on discharge to home.  She will follow-up with psychiatry as an outpatient.  Medically stable for discharge today.  Following problems were addressed during hospitalization:  Altered mental status: Likely secondary to underlying psychiatric problems versus concurrent UTI.  She was found to be confused, hallucinating.  She was found to have whole body rigidity, catatonia, she was unresponsive.  She was recently hospitalized at [facility] psychiatry for paranoia and hallucinations.  CT head did not show any acute intracranial normalities.  Neurology, psychiatry were  consulted. Mental status has improved after she was given Ativan, benztropine in ED.  EEG was declined by patient.   Currently she is alert and oriented.  She does not meet criteria for inpatient psychiatric admission.    Continue current medications.    Ecoli UTI: Presented with leukocytosis,is afebrile.  She was tachycardic on presentation.  Lactate was normal.  Started on ceftriaxone.  Procalcitonin negative.    Leukocytosis has resolved.  Urine culture showed pansensitive E. coli.  Antibiotics changed to oral on discharge.   Urinary retention: Foley place here, removed now.  On Flomax.  She has history of urinary retention, follows with outpatient urology.  Will recommend to continue following with urology as an outpatient   Tics of organic origin: On Haldol    Depression/anxiety/paranoid psychosis: Management as per psychiatry.  Currently on buspirone pregabalin , Seroquel,haldol, Adderall  History of chronic pain syndrome: On both gabapentin and Lyrica.  She follows with neurology as an outpatient.  We recommend to continue to follow-up with neurology.   History of asthma: Currently stable.  Not in exacerbation.  Continue bronchodilators she was taking at home       Discharge Diagnoses:  Principal Problem:   Acute metabolic encephalopathy Active Problems:   Tics of organic origin   Depression with anxiety   Seizures ([place])   Asthma   Paranoid ([place])   Psychosis ([place])   UTI (urinary tract infection)   Sepsis ([place])   Urinary retention    Discharge Instructions  Discharge Instructions     Diet - low sodium heart healthy   Complete by: As directed    Discharge instructions   Complete by: As directed    1)Please follow up with psychiatry as an outpatient. Name and number of the provider group  has been attached 2)Follow up with your PCP as an outpatient 3)Take your medications as instructed   Increase activity slowly   Complete by: As directed       Allergies as of  04/24/2021       Reactions   Augmentin  [amoxicillin-pot Clavulanate] Diarrhea   Chocolate Flavor    Other reaction(s): Other (See Comments) Wheezing, acne   Demerol [meperidine] Other (See Comments)   Slow to wake up when this drug is given.    Keflex [cephalexin] Nausea And Vomiting   Morphine And Related Nausea And Vomiting   Tape Dermatitis   Must use paper tape only   Toradol [ketorolac Tromethamine] Nausea And Vomiting   Chocolate    GI distress   Nsaids    patient develops ulcers   Strawberry Extract    GI distress        Medication List     STOP taking these medications    fluticasone 110 MCG/ACT inhaler Commonly known as: FLOVENT HFA   loratadine 10 MG tablet Commonly known as: CLARITIN       TAKE these medications    albuterol 108 (90 Base) MCG/ACT inhaler Commonly known as: Ventolin HFA Inhale 2 puffs into the lungs every 6 (six) hours as needed.   amphetamine-dextroamphetamine 15 MG 24 hr capsule Commonly known as: Adderall XR Take 1 capsule by mouth every morning.   azelastine 0.05 % ophthalmic solution Commonly known as: OPTIVAR Apply to eye as directed. 1 drop in each eye prn   busPIRone 15 MG tablet Commonly known as: BUSPAR TAKE 1 TABLET BY MOUTH 2 TIMES DAILY.   busPIRone 10 MG tablet Commonly known as: BUSPAR Take 1 tablet (10 mg total) by mouth 2 (two) times daily. Take along with '15mg'$  tablet twice a day   cefdinir 300 MG capsule Commonly known as: OMNICEF Take 1 capsule (300 mg total) by mouth every 12 (twelve) hours for 2 days. Start taking on: April 25, 2021   celecoxib 200 MG capsule Commonly known as: CELEBREX TAKE 1 CAPSULE BY MOUTH TWICE A DAY   cyclobenzaprine 5 MG tablet Commonly known as: FLEXERIL Take 1 tablet (5 mg total) by mouth 3 (three) times daily as needed for muscle spasms. Do not take within 6 hours of taking tizandine   diclofenac Sodium 1 % Gel Commonly known as: VOLTAREN APPLY AS NEEDED 3 TIMES A  DAY   DULoxetine 60 MG capsule Commonly known as: CYMBALTA TAKE 2 CAPSULES BY MOUTH EVERY DAY   fentaNYL 100 MCG/HR Commonly known as: [place] 1 patch onto the skin every 3 (three) days.   fexofenadine 180 MG tablet Commonly known as: ALLEGRA Take 180 mg by mouth daily.   fluticasone 50 MCG/ACT nasal spray Commonly known as: FLONASE SPRAY 2 SPRAYS INTO EACH NOSTRIL EVERY DAY   Fluticasone-Salmeterol 250-50 MCG/DOSE Aepb Commonly known as: ADVAIR Inhale 1 puff into the lungs every 12 (twelve) hours. Rinse mouth after use   gabapentin 600 MG tablet Commonly known as: NEURONTIN TAKE 2 TABLETS IN THE MORNING AND 3 TABLETS AT BEDTIME   haloperidol 0.5 MG tablet Commonly known as: HALDOL TAKE 1/2 A TAB BY MOUTH TWICE A DAY   haloperidol 1 MG tablet Commonly known as: HALDOL Take 1 mg by mouth 2 (two) times daily.   omeprazole 40 MG capsule Commonly known as: PRILOSEC TAKE 1 CAPSULE (40 MG TOTAL) BY MOUTH DAILY.   ondansetron 4 MG disintegrating tablet Commonly known as: ZOFRAN-ODT DISSOLVE 1 TABLET  IN MOUTH EVERY 8 HOURS FOR NAUSEA AND VOMITING   pregabalin 300 MG capsule Commonly known as: LYRICA Take 1 capsule (300 mg total) by mouth 2 (two) times daily.   QUEtiapine 200 MG tablet Commonly known as: SEROQUEL TAKE 1 TABLET BY MOUTH AT BEDTIME.   tamsulosin 0.4 MG Caps capsule Commonly known as: FLOMAX Take 1 capsule (0.4 mg total) by mouth daily.   tiZANidine 4 MG tablet Commonly known as: ZANAFLEX TAKE 0.5-1 TABLETS (2-4 MG TOTAL) BY MOUTH EVERY 8 (EIGHT) HOURS AS NEEDED FOR MUSCLE SPASMS.        Follow-up Information     [facility] Schedule an appointment as soon as possible for a visit in 2 day(s).   Why: She can walk in Mon/Wed/Fri from 8 am - 3 pm or call and make an appointment. Contact information: [place] [address] [phone]         Birdie Sons, MD Follow up in 1 week(s).   Specialty:  Family Medicine Contact information: [address] [place] [address] [phone]                Allergies  Allergen Reactions   Augmentin  [Amoxicillin-Pot Clavulanate] Diarrhea   Chocolate Flavor     Other reaction(s): Other (See Comments) Wheezing, acne   Demerol [Meperidine] Other (See Comments)    Slow to wake up when this drug is given.    Keflex [Cephalexin] Nausea And Vomiting   Morphine And Related Nausea And Vomiting   Tape Dermatitis    Must use paper tape only   Toradol [Ketorolac Tromethamine] Nausea And Vomiting   Chocolate     GI distress   Nsaids     patient develops ulcers   Strawberry Extract     GI distress    Consultations: Psychiatry   Procedures/Studies: CT ABDOMEN PELVIS WO CONTRAST  Result Date: 04/22/2021 CLINICAL DATA:  Altered mental status. Abdominal pain. EXAM: CT ABDOMEN AND PELVIS WITHOUT CONTRAST TECHNIQUE: Multidetector CT imaging of the abdomen and pelvis was performed following the standard protocol without IV contrast. COMPARISON:  11/21/2012 FINDINGS: Lower chest: The lung bases are clear of an acute pulmonary process. No pleural effusions or pulmonary lesions. The heart is normal in size. No pericardial effusion. Hepatobiliary: No hepatic lesions are identified without contrast. No intrahepatic biliary dilatation. The gallbladder is grossly normal. No common bile duct dilatation. Pancreas: No mass, inflammation or ductal dilatation. Spleen: Normal size. No focal lesions. Adrenals/Urinary Tract: Adrenal glands and kidneys are unremarkable. The left kidney demonstrates moderate renal cortical scarring changes. Marked distention of the bladder is noted. It extends well above the umbilicus. Possible bladder outlet obstruction. No bladder wall thickening or bladder mass. No bladder calculi. Stomach/Bowel: The stomach, duodenum and small bowel are grossly normal without oral contrast. No inflammatory changes or obstructive  findings. There is moderate stool throughout the colon and down into the rectum where there is moderate fecal impaction and mild rectal wall thickening. Associated mild presacral edema. Vascular/Lymphatic: Scattered aortic calcifications but no aneurysm. No mesenteric or retroperitoneal mass or adenopathy. Reproductive: The uterus and ovaries are unremarkable. Other: No pelvic mass or adenopathy. No free pelvic fluid collections. No inguinal mass or adenopathy. No abdominal wall hernia or subcutaneous lesions. Musculoskeletal: No significant bony findings. IMPRESSION: 1. Moderate stool throughout the colon and down into the rectum where there is moderate fecal impaction. 2. Distended bladder but no wall thickening or mass. 3. Moderate left  renal cortical scarring changes. 4. No other significant abdominal/pelvic findings, mass lesions or adenopathy. 5. Aortic atherosclerosis. Aortic Atherosclerosis (ICD10-I70.0). Electronically Signed   By: Marijo Sanes M.D.   On: 04/22/2021 09:22   CT Head Wo Contrast  Result Date: 04/22/2021 CLINICAL DATA:  45 year old female with altered mental status. History of Ehlers-Danlos syndrome, EXAM: CT HEAD WITHOUT CONTRAST TECHNIQUE: Contiguous axial images were obtained from the base of the skull through the vertex without intravenous contrast. COMPARISON:  Paranasal sinus CT 1226. FINDINGS: Brain: No midline shift, ventriculomegaly, mass effect, evidence of mass lesion, intracranial hemorrhage or evidence of cortically based acute infarction. Gray-white matter differentiation is within normal limits throughout the brain. Cerebral volume is within normal limits. Vascular: Mild Calcified atherosclerosis at the skull base. No suspicious intracranial vascular hyperdensity. Skull: Negative. Sinuses/Orbits: Mild bubbly opacity in the left sphenoid sinus and posterior right ethmoid mucosal thickening. But other Visualized paranasal sinuses and mastoids are clear. Other: Leftward gaze  deviation. Visualized scalp soft tissues are within normal limits. IMPRESSION: 1. Normal noncontrast CT appearance of the brain. 2. Mild paranasal sinus inflammation. Electronically Signed   By: Genevie Ann M.D.   On: 04/22/2021 08:52   DG Chest Port 1 View  Result Date: 04/22/2021 CLINICAL DATA:  Altered mental status. Decreased level of consciousness. EXAM: PORTABLE CHEST 1 VIEW COMPARISON:  Chest x-ray 01/29/2021 FINDINGS: The cardiac silhouette, mediastinal hilar contours are within normal limits and stable. The lungs are clear. No pleural effusions pulmonary lesions. No pneumothorax. The bony thorax is intact. IMPRESSION: No acute cardiopulmonary findings. Electronically Signed   By: Marijo Sanes M.D.   On: 04/22/2021 07:41      Subjective: Patient seen and examined at the bedside this morning.  Hemodynamically stable for discharge today.  Neurology did not recommend inpatient psychiatric admission.  I discussed with her mom on phone this morning about possible discharge today and further steps  Discharge Exam: Vitals:   04/24/21 0812 04/24/21 1238  BP: 126/68 112/73  Pulse: 87 84  Resp: 18 17  Temp: 99.1 F (37.3 C) 99 F (37.2 C)  SpO2: 98% 98%   Vitals:   04/23/21 2003 04/24/21 0616 04/24/21 0812 04/24/21 1238  BP: 121/80 (!) 96/57 126/68 112/73  Pulse: 93 80 87 84  Resp: '16 16 18 17  '$ Temp: 97.7 F (36.5 C) 98.4 F (36.9 C) 99.1 F (37.3 C) 99 F (37.2 C)  TempSrc: Oral Oral    SpO2: 99% 96% 98% 98%  Weight:      Height:        General: Pt is alert, awake, not in acute distress Cardiovascular: RRR, S1/S2 +, no rubs, no gallops Respiratory: CTA bilaterally, no wheezing, no rhonchi Abdominal: Soft, NT, ND, bowel sounds + Extremities: no edema, no cyanosis    The results of significant diagnostics from this hospitalization (including imaging, microbiology, ancillary and laboratory) are listed below for reference.     Microbiology: Recent Results (from the past 240  hour(s))  Resp Panel by RT-PCR (Flu A&B, Covid)     Status: None   Collection Time: 04/22/21  7:55 AM   Specimen: Nasopharyngeal(NP) swabs in vial transport medium  Result Value Ref Range Status   SARS Coronavirus 2 by RT PCR NEGATIVE NEGATIVE Final    Comment: (NOTE) SARS-CoV-2 target nucleic acids are NOT DETECTED.  The SARS-CoV-2 RNA is generally detectable in upper respiratory specimens during the acute phase of infection. The lowest concentration of SARS-CoV-2 viral copies this assay can detect  is 138 copies/mL. A negative result does not preclude SARS-Cov-2 infection and should not be used as the sole basis for treatment or other patient management decisions. A negative result may occur with  improper specimen collection/handling, submission of specimen other than nasopharyngeal swab, presence of viral mutation(s) within the areas targeted by this assay, and inadequate number of viral copies(<138 copies/mL). A negative result must be combined with clinical observations, patient history, and epidemiological information. The expected result is Negative.  Fact Sheet for Patients:  EntrepreneurPulse.com.au  Fact Sheet for Healthcare Providers:  IncredibleEmployment.be  This test is no t yet approved or cleared by the [place] FDA and  has been authorized for detection and/or diagnosis of SARS-CoV-2 by FDA under an Emergency Use Authorization (EUA). This EUA will remain  in effect (meaning this test can be used) for the duration of the COVID-19 declaration under Section 564(b)(1) of the Act, 21 U.S.C.section 360bbb-3(b)(1), unless the authorization is terminated  or revoked sooner.       Influenza A by PCR NEGATIVE NEGATIVE Final   Influenza B by PCR NEGATIVE NEGATIVE Final    Comment: (NOTE) The Xpert Xpress SARS-CoV-2/FLU/RSV plus assay is intended as an aid in the diagnosis of influenza from Nasopharyngeal swab specimens  and should not be used as a sole basis for treatment. Nasal washings and aspirates are unacceptable for Xpert Xpress SARS-CoV-2/FLU/RSV testing.  Fact Sheet for Patients: EntrepreneurPulse.com.au  Fact Sheet for Healthcare Providers: IncredibleEmployment.be  This test is not yet approved or cleared by the [place] FDA and has been authorized for detection and/or diagnosis of SARS-CoV-2 by FDA under an Emergency Use Authorization (EUA). This EUA will remain in effect (meaning this test can be used) for the duration of the COVID-19 declaration under Section 564(b)(1) of the Act, 21 U.S.C. section 360bbb-3(b)(1), unless the authorization is terminated or revoked.  Performed at [facility], [address] [place], [place] [address]   Urine Culture     Status: Abnormal   Collection Time: 04/22/21  7:55 AM   Specimen: Urine, Clean Catch  Result Value Ref Range Status   Specimen Description   Final    URINE, CLEAN CATCH Performed at [facility], [address], [place], [place] [address]    Special Requests   Final    NONE Performed at [facility], [place], [place] [address]    Culture >=100,000 COLONIES/mL ESCHERICHIA COLI (A)  Final   Report Status 04/24/2021 FINAL  Final   Organism ID, Bacteria ESCHERICHIA COLI (A)  Final      Susceptibility   Escherichia coli - MIC*    AMPICILLIN 8 SENSITIVE Sensitive     CEFAZOLIN <=4 SENSITIVE Sensitive     CEFEPIME <=0.12 SENSITIVE Sensitive     CEFTRIAXONE <=0.25 SENSITIVE Sensitive     CIPROFLOXACIN <=0.25 SENSITIVE Sensitive     GENTAMICIN <=1 SENSITIVE Sensitive     IMIPENEM <=0.25 SENSITIVE Sensitive     NITROFURANTOIN 32 SENSITIVE Sensitive     TRIMETH/SULFA <=20 SENSITIVE Sensitive     AMPICILLIN/SULBACTAM 4 SENSITIVE Sensitive     PIP/TAZO <=4 SENSITIVE Sensitive     * >=100,000 COLONIES/mL ESCHERICHIA COLI  Culture, blood (routine x 2)      Status: None (Preliminary result)   Collection Time: 04/22/21  9:03 AM   Specimen: BLOOD  Result Value Ref Range Status   Specimen Description BLOOD  RAC  Final   Special Requests   Final  BOTTLES DRAWN AEROBIC AND ANAEROBIC Blood Culture adequate volume   Culture   Final    NO GROWTH 2 DAYS Performed at [facility], [address] [place], [place] [address]    Report Status PENDING  Incomplete  Culture, blood (routine x 2)     Status: None (Preliminary result)   Collection Time: 04/22/21  9:03 AM   Specimen: BLOOD  Result Value Ref Range Status   Specimen Description BLOOD [facility]  Final   Special Requests   Final    BOTTLES DRAWN AEROBIC AND ANAEROBIC Blood Culture adequate volume   Culture   Final    NO GROWTH 2 DAYS Performed at [facility], [address] [place], [place] [address]    Report Status PENDING  Incomplete     Labs: BNP (last 3 results) No results for input(s): BNP in the last 8760 hours. Basic Metabolic Panel: Recent Labs  Lab 04/22/21 0755  NA 138  K 3.7  CL 104  CO2 23  GLUCOSE 141*  BUN 12  CREATININE 0.64  CALCIUM 9.3   Liver Function Tests: Recent Labs  Lab 04/22/21 0755  AST 18  ALT 12  ALKPHOS 75  BILITOT 0.7  PROT 7.7  ALBUMIN 4.1   No results for input(s): LIPASE, AMYLASE in the last 168 hours. Recent Labs  Lab 04/22/21 0755  AMMONIA 22   CBC: Recent Labs  Lab 04/22/21 0755 04/23/21 0624  WBC 16.5* 9.1  NEUTROABS 14.3*  --   HGB 15.3* 12.4  HCT 44.2 36.0  MCV 84.5 85.9  PLT 311 241   Cardiac Enzymes: No results for input(s): CKTOTAL, CKMB, CKMBINDEX, TROPONINI in the last 168 hours. BNP: Invalid input(s): POCBNP CBG: Recent Labs  Lab 04/22/21 0725 04/23/21 0804 04/24/21 0930  GLUCAP 141* 101* 149*   D-Dimer No results for input(s): DDIMER in the last 72 hours. Hgb A1c No results for input(s): HGBA1C in the last 72 hours. Lipid Profile No results for input(s): CHOL, HDL,  LDLCALC, TRIG, CHOLHDL, LDLDIRECT in the last 72 hours. Thyroid function studies No results for input(s): TSH, T4TOTAL, T3FREE, THYROIDAB in the last 72 hours.  Invalid input(s): FREET3 Anemia work up No results for input(s): VITAMINB12, FOLATE, FERRITIN, TIBC, IRON, RETICCTPCT in the last 72 hours. Urinalysis    Component Value Date/Time   COLORURINE YELLOW 04/22/2021 0755   APPEARANCEUR CLEAR (A) 04/22/2021 0755   APPEARANCEUR Cloudy (A) 11/18/2019 0953   LABSPEC 1.015 04/22/2021 0755   LABSPEC 1.004 02/19/2014 0142   PHURINE 7.0 04/22/2021 0755   GLUCOSEU NEGATIVE 04/22/2021 0755   GLUCOSEU Negative 02/19/2014 0142   HGBUR NEGATIVE 04/22/2021 0755   BILIRUBINUR NEGATIVE 04/22/2021 0755   BILIRUBINUR Negative 11/18/2019 0953   BILIRUBINUR Negative 02/19/2014 0142   KETONESUR NEGATIVE 04/22/2021 0755   PROTEINUR NEGATIVE 04/22/2021 0755   UROBILINOGEN 0.2 10/09/2019 1632   NITRITE NEGATIVE 04/22/2021 0755   LEUKOCYTESUR SMALL (A) 04/22/2021 0755   LEUKOCYTESUR Negative 02/19/2014 0142   Sepsis Labs Invalid input(s): PROCALCITONIN,  WBC,  LACTICIDVEN Microbiology Recent Results (from the past 240 hour(s))  Resp Panel by RT-PCR (Flu A&B, Covid)     Status: None   Collection Time: 04/22/21  7:55 AM   Specimen: Nasopharyngeal(NP) swabs in vial transport medium  Result Value Ref Range Status   SARS Coronavirus 2 by RT PCR NEGATIVE NEGATIVE Final    Comment: (NOTE) SARS-CoV-2 target nucleic acids are NOT DETECTED.  The SARS-CoV-2 RNA is generally detectable in upper respiratory specimens  during the acute phase of infection. The lowest concentration of SARS-CoV-2 viral copies this assay can detect is 138 copies/mL. A negative result does not preclude SARS-Cov-2 infection and should not be used as the sole basis for treatment or other patient management decisions. A negative result may occur with  improper specimen collection/handling, submission of specimen other than  nasopharyngeal swab, presence of viral mutation(s) within the areas targeted by this assay, and inadequate number of viral copies(<138 copies/mL). A negative result must be combined with clinical observations, patient history, and epidemiological information. The expected result is Negative.  Fact Sheet for Patients:  EntrepreneurPulse.com.au  Fact Sheet for Healthcare Providers:  IncredibleEmployment.be  This test is no t yet approved or cleared by the [place] FDA and  has been authorized for detection and/or diagnosis of SARS-CoV-2 by FDA under an Emergency Use Authorization (EUA). This EUA will remain  in effect (meaning this test can be used) for the duration of the COVID-19 declaration under Section 564(b)(1) of the Act, 21 U.S.C.section 360bbb-3(b)(1), unless the authorization is terminated  or revoked sooner.       Influenza A by PCR NEGATIVE NEGATIVE Final   Influenza B by PCR NEGATIVE NEGATIVE Final    Comment: (NOTE) The Xpert Xpress SARS-CoV-2/FLU/RSV plus assay is intended as an aid in the diagnosis of influenza from Nasopharyngeal swab specimens and should not be used as a sole basis for treatment. Nasal washings and aspirates are unacceptable for Xpert Xpress SARS-CoV-2/FLU/RSV testing.  Fact Sheet for Patients: EntrepreneurPulse.com.au  Fact Sheet for Healthcare Providers: IncredibleEmployment.be  This test is not yet approved or cleared by the [place] FDA and has been authorized for detection and/or diagnosis of SARS-CoV-2 by FDA under an Emergency Use Authorization (EUA). This EUA will remain in effect (meaning this test can be used) for the duration of the COVID-19 declaration under Section 564(b)(1) of the Act, 21 U.S.C. section 360bbb-3(b)(1), unless the authorization is terminated or revoked.  Performed at [facility], [address] [place], [place]  [address]   Urine Culture     Status: Abnormal   Collection Time: 04/22/21  7:55 AM   Specimen: Urine, Clean Catch  Result Value Ref Range Status   Specimen Description   Final    URINE, CLEAN CATCH Performed at [facility], [address] [place], [place] [address]    Special Requests   Final    NONE Performed at [facility], [place],  [address]    Culture >=100,000 COLONIES/mL ESCHERICHIA COLI (A)  Final   Report Status 04/24/2021 FINAL  Final   Organism ID, Bacteria ESCHERICHIA COLI (A)  Final      Susceptibility   Escherichia coli - MIC*    AMPICILLIN 8 SENSITIVE Sensitive     CEFAZOLIN <=4 SENSITIVE Sensitive     CEFEPIME <=0.12 SENSITIVE Sensitive     CEFTRIAXONE <=0.25 SENSITIVE Sensitive     CIPROFLOXACIN <=0.25 SENSITIVE Sensitive     GENTAMICIN <=1 SENSITIVE Sensitive     IMIPENEM <=0.25 SENSITIVE Sensitive     NITROFURANTOIN 32 SENSITIVE Sensitive     TRIMETH/SULFA <=20 SENSITIVE Sensitive     AMPICILLIN/SULBACTAM 4 SENSITIVE Sensitive     PIP/TAZO <=4 SENSITIVE Sensitive     * >=100,000 COLONIES/mL ESCHERICHIA COLI  Culture, blood (routine x 2)     Status: None (Preliminary result)   Collection Time: 04/22/21  9:03 AM   Specimen: BLOOD  Result Value Ref Range Status  Specimen Description BLOOD  RAC  Final   Special Requests   Final    BOTTLES DRAWN AEROBIC AND ANAEROBIC Blood Culture adequate volume   Culture   Final    NO GROWTH 2 DAYS Performed at [facility], [address] [place], [place] [address]    Report Status PENDING  Incomplete  Culture, blood (routine x 2)     Status: None (Preliminary result)   Collection Time: 04/22/21  9:03 AM   Specimen: BLOOD  Result Value Ref Range Status   Specimen Description BLOOD [facility]  Final   Special Requests   Final    BOTTLES DRAWN AEROBIC AND ANAEROBIC Blood Culture adequate volume   Culture   Final    NO GROWTH 2 DAYS Performed at [facility],  [address], [place], [place] [address]    Report Status PENDING  Incomplete    Please note: You were cared for by a hospitalist during your hospital stay. Once you are discharged, your primary care physician will handle any further medical issues. Please note that NO REFILLS for any discharge medications will be authorized once you are discharged, as it is imperative that you return to your primary care physician (or establish a relationship with a primary care physician if you do not have one) for your post hospital discharge needs so that they can reassess your need for medications and monitor your lab values.    Time coordinating discharge: 40 minutes  SIGNED:   Shelly Coss, MD  [facility] 04/24/2021, 2:17 PM Pager [phone]  If 7PM-7AM, please contact night-coverage [email or web address] Password [email or web address]

## 2021-04-24 NOTE — Progress Notes (Signed)
PROGRESS NOTE    Karen [name]  [id number] DOB: 09-Nov-1975 DOA: 04/22/2021 PCP: Birdie Sons, MD   Chief Complain: AMS  Brief Narrative:  Patient is a 45 year old female with history of paranoid psychosis, asthma, OSA tics of organic   origin, Ehlers-Danlos syndrome, fibromyalgia, headache urinary retention with Foley catheter, seizure, remote arthritis, TBI, wheelchair-bound who presented with altered mental status from home.  She lives with her mother.  She was found to be confused, hallucinating.  She was found to have whole body rigidity, catatonia, she was unresponsive.  She was recently hospitalized at [facility] psychiatry for paranoia and hallucinations.  On presentation she was found to have leukocytosis, UA suspicious for UTI with leukocytes and bacteria.  CT head negative for acute intracranial abnormalities. In the emergency department, she was given 2 mg of Ativan, 2 mg of benztropine with improvement in the mental status.  Psychiatry is following.  Currently being treated for UTI, urine culture pending.    Assessment & Plan:   Principal Problem:   Acute metabolic encephalopathy Active Problems:   Tics of organic origin   Depression with anxiety   Seizures (HCC)   Asthma   Paranoid ([place])   Psychosis ([place])   UTI (urinary tract infection)   Sepsis ([place])   Urinary retention   Altered mental status: Likely secondary to underlying psychiatric problems versus concurrent UTI.  She was found to be confused, hallucinating.  She was found to have whole body rigidity, catatonia, she was unresponsive.  She was recently hospitalized at [facility] psychiatry for paranoia and hallucinations.  CT head did not show any acute intracranial normalities.  Neurology, psychiatry were consulted. Mental status has improved after she was given Ativan, benztropine in ED.  EEG was declined by patient.   Currently she is alert and oriented.  She does not meet criteria for inpatient psychiatric  admission.    Continue current medications.  We have requested for neurology follow-up today.  Suspected UTI: Presented with leukocytosis,is afebrile.  She was tachycardic on presentation.  Lactate is normal.  Started on ceftriaxone.  Procalcitonin negative.  Follow-up urine culture, showing E. coli, susceptibility pending.  Leukocytosis has resolved.  Urinary retention: Foley place here, will give a voiding trial today.  On Flomax  Seizure disorder: Currently not taking any meds.  EEG. Declined by patient.  Neurology signed off.  Continue as needed medications for seizures if needed.  Tics of organic origin: On Haldol   Depression/anxiety/paranoid psychosis: Management as per psychiatry.  Currently on buspirone, Cymbalta, gabapentin, Ativan as needed, pregabalin , Seroquel,haldol  History of asthma: Currently stable.  Not in exacerbation.  Continue bronchodilators as needed.         DVT prophylaxis: Lovenox Code Status: Full code Family Communication: Called and discussed with mother on phone on 04/24/2021 Status is: Inpatient  Remains inpatient appropriate because:Unsafe d/c plan  Dispo: The patient is from: Home              Anticipated d/c is to: Home              Patient currently is not medically stable to d/c.   Difficult to place patient No     Consultants: Psychiatry  Procedures: None  Antimicrobials:  Anti-infectives (From admission, onward)    Start     Dose/Rate Route Frequency Ordered Stop   04/23/21 0800  cefTRIAXone (ROCEPHIN) 1 g in sodium chloride 0.9 % 100 mL IVPB        1 g  200 mL/hr over 30 Minutes Intravenous Every 24 hours 04/22/21 1959     04/22/21 0845  cefTRIAXone (ROCEPHIN) 2 g in sodium chloride 0.9 % 100 mL IVPB        2 g 200 mL/hr over 30 Minutes Intravenous  Once 04/22/21 0842 04/22/21 1040       Subjective:  Patient seen and examined at bedside this morning.  Hemodynamically stable.  Denies any complaints today.  Denies any lower  abdominal pain or discomfort or burning urination or frequency.  Objective: Vitals:   04/23/21 1149 04/23/21 1700 04/23/21 2003 04/24/21 0616  BP: 115/75 104/75 121/80 (!) 96/57  Pulse: 92 95 93 80  Resp: '18  16 16  '$ Temp: 98.6 F (37 C) 98 F (36.7 C) 97.7 F (36.5 C) 98.4 F (36.9 C)  TempSrc: Oral Oral Oral Oral  SpO2: 100% 100% 99% 96%  Weight:      Height:        Intake/Output Summary (Last 24 hours) at 04/24/2021 0747 Last data filed at 04/24/2021 0631 Gross per 24 hour  Intake 460 ml  Output 3870 ml  Net -3410 ml   Filed Weights   04/22/21 0639  Weight: 59.9 kg    Examination:  General exam: Overall comfortable, not in distress HEENT: PERRL Respiratory system:  no wheezes or crackles  Cardiovascular system: S1 & S2 heard, RRR.  Gastrointestinal system: Abdomen is nondistended, soft and nontender. Central nervous system: Alert and oriented Extremities: No edema, no clubbing ,no cyanosis Skin: No rashes, no ulcers,no icterus   GU: foley     Data Reviewed: I have personally reviewed following labs and imaging studies  CBC: Recent Labs  Lab 04/22/21 0755 04/23/21 0624  WBC 16.5* 9.1  NEUTROABS 14.3*  --   HGB 15.3* 12.4  HCT 44.2 36.0  MCV 84.5 85.9  PLT 311 [id number]   Basic Metabolic Panel: Recent Labs  Lab 04/22/21 0755  NA 138  K 3.7  CL 104  CO2 23  GLUCOSE 141*  BUN 12  CREATININE 0.64  CALCIUM 9.3   GFR: Estimated Creatinine Clearance: 70.7 mL/min (by C-G formula based on SCr of 0.64 mg/dL). Liver Function Tests: Recent Labs  Lab 04/22/21 0755  AST 18  ALT 12  ALKPHOS 75  BILITOT 0.7  PROT 7.7  ALBUMIN 4.1   No results for input(s): LIPASE, AMYLASE in the last 168 hours. Recent Labs  Lab 04/22/21 0755  AMMONIA 22   Coagulation Profile: No results for input(s): INR, PROTIME in the last 168 hours. Cardiac Enzymes: No results for input(s): CKTOTAL, CKMB, CKMBINDEX, TROPONINI in the last 168 hours. BNP (last 3 results) No  results for input(s): PROBNP in the last 8760 hours. HbA1C: No results for input(s): HGBA1C in the last 72 hours. CBG: Recent Labs  Lab 04/22/21 0725 04/23/21 0804  GLUCAP 141* 101*   Lipid Profile: No results for input(s): CHOL, HDL, LDLCALC, TRIG, CHOLHDL, LDLDIRECT in the last 72 hours. Thyroid Function Tests: No results for input(s): TSH, T4TOTAL, FREET4, T3FREE, THYROIDAB in the last 72 hours. Anemia Panel: No results for input(s): VITAMINB12, FOLATE, FERRITIN, TIBC, IRON, RETICCTPCT in the last 72 hours. Sepsis Labs: Recent Labs  Lab 04/22/21 0755 04/22/21 0911  PROCALCITON  --  <0.10  LATICACIDVEN 1.6  --     Recent Results (from the past 240 hour(s))  Resp Panel by RT-PCR (Flu A&B, Covid)     Status: None   Collection Time: 04/22/21  7:55 AM   Specimen:  Nasopharyngeal(NP) swabs in vial transport medium  Result Value Ref Range Status   SARS Coronavirus 2 by RT PCR NEGATIVE NEGATIVE Final    Comment: (NOTE) SARS-CoV-2 target nucleic acids are NOT DETECTED.  The SARS-CoV-2 RNA is generally detectable in upper respiratory specimens during the acute phase of infection. The lowest concentration of SARS-CoV-2 viral copies this assay can detect is 138 copies/mL. A negative result does not preclude SARS-Cov-2 infection and should not be used as the sole basis for treatment or other patient management decisions. A negative result may occur with  improper specimen collection/handling, submission of specimen other than nasopharyngeal swab, presence of viral mutation(s) within the areas targeted by this assay, and inadequate number of viral copies(<138 copies/mL). A negative result must be combined with clinical observations, patient history, and epidemiological information. The expected result is Negative.  Fact Sheet for Patients:  EntrepreneurPulse.com.au  Fact Sheet for Healthcare Providers:  IncredibleEmployment.be  This test is  no t yet approved or cleared by the [place] FDA and  has been authorized for detection and/or diagnosis of SARS-CoV-2 by FDA under an Emergency Use Authorization (EUA). This EUA will remain  in effect (meaning this test can be used) for the duration of the COVID-19 declaration under Section 564(b)(1) of the Act, 21 U.S.C.section 360bbb-3(b)(1), unless the authorization is terminated  or revoked sooner.       Influenza A by PCR NEGATIVE NEGATIVE Final   Influenza B by PCR NEGATIVE NEGATIVE Final    Comment: (NOTE) The Xpert Xpress SARS-CoV-2/FLU/RSV plus assay is intended as an aid in the diagnosis of influenza from Nasopharyngeal swab specimens and should not be used as a sole basis for treatment. Nasal washings and aspirates are unacceptable for Xpert Xpress SARS-CoV-2/FLU/RSV testing.  Fact Sheet for Patients: EntrepreneurPulse.com.au  Fact Sheet for Healthcare Providers: IncredibleEmployment.be  This test is not yet approved or cleared by the [place] FDA and has been authorized for detection and/or diagnosis of SARS-CoV-2 by FDA under an Emergency Use Authorization (EUA). This EUA will remain in effect (meaning this test can be used) for the duration of the COVID-19 declaration under Section 564(b)(1) of the Act, 21 U.S.C. section 360bbb-3(b)(1), unless the authorization is terminated or revoked.  Performed at [facility], [address] [place], [place] [address]   Urine Culture     Status: Abnormal (Preliminary result)   Collection Time: 04/22/21  7:55 AM   Specimen: Urine, Clean Catch  Result Value Ref Range Status   Specimen Description   Final    URINE, CLEAN CATCH Performed at [facility], [address] [place], [place] [address]    Special Requests   Final    NONE Performed at [facility], [address] [place], [place] [address]    Culture >=100,000 COLONIES/mL ESCHERICHIA  COLI (A)  Final   Report Status PENDING  Incomplete  Culture, blood (routine x 2)     Status: None (Preliminary result)   Collection Time: 04/22/21  9:03 AM   Specimen: BLOOD  Result Value Ref Range Status   Specimen Description BLOOD  RAC  Final   Special Requests   Final    BOTTLES DRAWN AEROBIC AND ANAEROBIC Blood Culture adequate volume   Culture   Final    NO GROWTH 2 DAYS Performed at [facility], [address] [place], [place] [address]    Report Status PENDING  Incomplete  Culture, blood (routine x 2)     Status: None (  Preliminary result)   Collection Time: 04/22/21  9:03 AM   Specimen: BLOOD  Result Value Ref Range Status   Specimen Description BLOOD [facility]  Final   Special Requests   Final    BOTTLES DRAWN AEROBIC AND ANAEROBIC Blood Culture adequate volume   Culture   Final    NO GROWTH 2 DAYS Performed at [facility], [address] [place], [place] [address]    Report Status PENDING  Incomplete         Radiology Studies: CT ABDOMEN PELVIS WO CONTRAST  Result Date: 04/22/2021 CLINICAL DATA:  Altered mental status. Abdominal pain. EXAM: CT ABDOMEN AND PELVIS WITHOUT CONTRAST TECHNIQUE: Multidetector CT imaging of the abdomen and pelvis was performed following the standard protocol without IV contrast. COMPARISON:  11/21/2012 FINDINGS: Lower chest: The lung bases are clear of an acute pulmonary process. No pleural effusions or pulmonary lesions. The heart is normal in size. No pericardial effusion. Hepatobiliary: No hepatic lesions are identified without contrast. No intrahepatic biliary dilatation. The gallbladder is grossly normal. No common bile duct dilatation. Pancreas: No mass, inflammation or ductal dilatation. Spleen: Normal size. No focal lesions. Adrenals/Urinary Tract: Adrenal glands and kidneys are unremarkable. The left kidney demonstrates moderate renal cortical scarring changes. Marked distention of the bladder is noted. It extends well  above the umbilicus. Possible bladder outlet obstruction. No bladder wall thickening or bladder mass. No bladder calculi. Stomach/Bowel: The stomach, duodenum and small bowel are grossly normal without oral contrast. No inflammatory changes or obstructive findings. There is moderate stool throughout the colon and down into the rectum where there is moderate fecal impaction and mild rectal wall thickening. Associated mild presacral edema. Vascular/Lymphatic: Scattered aortic calcifications but no aneurysm. No mesenteric or retroperitoneal mass or adenopathy. Reproductive: The uterus and ovaries are unremarkable. Other: No pelvic mass or adenopathy. No free pelvic fluid collections. No inguinal mass or adenopathy. No abdominal wall hernia or subcutaneous lesions. Musculoskeletal: No significant bony findings. IMPRESSION: 1. Moderate stool throughout the colon and down into the rectum where there is moderate fecal impaction. 2. Distended bladder but no wall thickening or mass. 3. Moderate left renal cortical scarring changes. 4. No other significant abdominal/pelvic findings, mass lesions or adenopathy. 5. Aortic atherosclerosis. Aortic Atherosclerosis (ICD10-I70.0). Electronically Signed   By: Marijo Sanes M.D.   On: 04/22/2021 09:22   CT Head Wo Contrast  Result Date: 04/22/2021 CLINICAL DATA:  45 year old female with altered mental status. History of Ehlers-Danlos syndrome, EXAM: CT HEAD WITHOUT CONTRAST TECHNIQUE: Contiguous axial images were obtained from the base of the skull through the vertex without intravenous contrast. COMPARISON:  Paranasal sinus CT 1226. FINDINGS: Brain: No midline shift, ventriculomegaly, mass effect, evidence of mass lesion, intracranial hemorrhage or evidence of cortically based acute infarction. Gray-white matter differentiation is within normal limits throughout the brain. Cerebral volume is within normal limits. Vascular: Mild Calcified atherosclerosis at the skull base. No  suspicious intracranial vascular hyperdensity. Skull: Negative. Sinuses/Orbits: Mild bubbly opacity in the left sphenoid sinus and posterior right ethmoid mucosal thickening. But other Visualized paranasal sinuses and mastoids are clear. Other: Leftward gaze deviation. Visualized scalp soft tissues are within normal limits. IMPRESSION: 1. Normal noncontrast CT appearance of the brain. 2. Mild paranasal sinus inflammation. Electronically Signed   By: Genevie Ann M.D.   On: 04/22/2021 08:52        Scheduled Meds:  busPIRone  10 mg Oral BID   busPIRone  15 mg Oral BID   celecoxib  200  mg Oral BID   Chlorhexidine Gluconate Cloth  6 each Topical Daily   diclofenac Sodium  2 g Topical TID   DULoxetine  120 mg Oral Daily   enoxaparin (LOVENOX) injection  40 mg Subcutaneous Q24H   fentaNYL  1 patch Transdermal Q72H   fluticasone  1 spray Each Nare Daily   gabapentin  1,200 mg Oral Daily   gabapentin  1,800 mg Oral QHS   haloperidol  1 mg Oral BID   ketotifen  1 drop Both Eyes BID   loratadine  10 mg Oral Daily   mometasone-formoterol  2 puff Inhalation BID   pantoprazole  40 mg Oral Daily   pregabalin  300 mg Oral BID   QUEtiapine  200 mg Oral QHS   tamsulosin  0.4 mg Oral Daily   Continuous Infusions:  cefTRIAXone (ROCEPHIN)  IV Stopped (04/23/21 0855)     LOS: 2 days    Time spent: 35 mins.More than 50% of that time was spent in counseling and/or coordination of care.      Shelly Coss, MD [facility] P9/05/2021, 7:47 AM

## 2021-04-24 NOTE — Consult Note (Addendum)
[facility] Consult   Reason for Consult: Follow-up consult for 45 year old woman with a history of multiple medical problems who presented to the hospital with altered mental status Referring Physician:  Adhikari Patient Identification: Karen [name] MRN:  [id number] Principal Diagnosis: Acute metabolic encephalopathy Diagnosis:  Principal Problem:   Acute metabolic encephalopathy Active Problems:   Tics of organic origin   Depression with anxiety   Seizures ([place])   Asthma   Paranoid ([place])   Psychosis ([place])   UTI (urinary tract infection)   Sepsis ([place])   Urinary retention   Total Time spent with patient: 30 minues  Subjective:   Karen [name] is a 45 y.o. female patient admitted with "I don't want any meds changed" in reference to psychotropics.  Client sitting upright in bed on assessment.  She reports physical pain which is "making me sad," and requested no changes to her behavior medications.  Denies suicidal/homicidal ideations, hallucinations, and substance abuse.  Not responding to internal stimuli on assessment.  Denies issues with sleep and appetite, "I need some bottled water," which this provider retrieved 3 bottles for her.  Then, she wanted her brother, Karen [name], to visit her tonight as "I can have two visitors".  Unfortunately she did not his number.  Encouraged her to ask her mother and could call him on her hospital phone.  Denies side effects from psychiatric medications.  Psychiatrically stable at this time.  Her mother, Karen [name], was called with no answer.  A voicemail was left to return the call as she evidently has some questions.    Collateral information from Karen [name]:   This provider and PMHNP student spoke with her mother about concerns of hallucinations.  Her mother states, "She won't tell you any of these things."  She is concerned that her daughter started hearing voices 3 weeks ago of people talking in her bedroom and "strange  music."  Then, the client was convinced that the things her brother gave her (who lives 7 hours away) upset her along with being convinced her brothers placed cameras in the home.  She also was worried their piano was going to be stolen by a famous person.  None of these symptoms are evident in the hospital, explained this to her mother who states, "She just whispers this to me."  Initially, her mother was going "to test her by placing her brother's things out."  Discussed better approaches if delusions return on how to manage it along with resources to follow up with [facility] which seemed to satisfy her mother.  Her mother also reported a head injury at the age of 12 and a second one in 61 as a senior in high school.  Her mother reported on the first TBI she went from a gifted student to learning disabled.  After her second TBI, she stayed in bed for a year and slept to cope.  She could walk but it was painful.  This year was the last year she walked because of the pain.  She did graduate from [employer] with a double major in music.  HPI per Dr [name] on 9/9: Patient seen chart reviewed.  Spoke with the patient and her mother including speaking with the mother outside the room.  Patient was awake today.  Tired and sluggish but still communicative.  Patient states her mood is feeling fine.  She denies having any hallucinations.  I reviewed with the patient the notes documenting her presentation to the hospital and she  dismissed most of it insisting that she had not been catatonic and insisting that she had not been having any catatonia or hallucinations at home.  Patient insists that at no point was she hearing or seeing things.  She seems very invested in insisting that she is not having mental health symptoms.  Patient denies suicidal or homicidal thought.  I spoke with her mother outside of the room who tells me that the patient's current presentation is very different from her baseline.  She thinks her current  behavior is irritable and guarded in a manner that is inappropriate.  She says the patient has been admitting to hallucinations when she is with her alone but will not admit it to staff.  We reviewed the recent history at [facility] and the fact that all of these symptoms seem to have just started about 3 weeks ago with no clear precipitant  Past Psychiatric History: Patient has a long history of impairment due to history of traumatic brain injuries and multiple medical problems.  Nevertheless mother insists that the patient had never had significant psychiatric symptoms until 3 weeks ago.  Recent hospitalization at [facility] she was discharged on antidepressant and low-dose antipsychotic medicine  Risk to Self:  none Risk to Others:  none Prior Inpatient Therapy:  [facility] Prior Outpatient Therapy:  none currently  Past Medical History:  Past Medical History:  Diagnosis Date   Ehlers-Danlos syndrome    Fibromyalgia    Headache    Sleep apnea    Tics of organic origin    Transient alteration of awareness     Past Surgical History:  Procedure Laterality Date   COLONOSCOPY WITH PROPOFOL N/A 02/13/2018   Procedure: COLONOSCOPY WITH PROPOFOL;  Surgeon: [name], MD;  Location: [facility] ENDOSCOPY;  Service: Endoscopy;  Laterality: N/A;   DILATION AND CURETTAGE OF UTERUS  2009   ESOPHAGOGASTRODUODENOSCOPY (EGD) WITH PROPOFOL  02/13/2018   Procedure: ESOPHAGOGASTRODUODENOSCOPY (EGD) WITH PROPOFOL;  Surgeon: [name], MD;  Location: [facility] ENDOSCOPY;  Service: Endoscopy;;   EYE SURGERY Left 1990   3 Surgeries on left eye to correct crossed eye   LAPAROSCOPY Left 2003   Fallopian Tube   [place]   3 Surgeries to repair broken arm   OTHER SURGICAL HISTORY Left    3 surgeries on her left thigh as an infant   OTHER SURGICAL HISTORY  1998   Jaw surgery   Right arm surgery  1987   x 3 due to fracture   TOENAIL EXCISION Bilateral 06/13/2019   Procedure:  BILATERAL SECOND TOE PARTIAL NAIL ABLATION;  Surgeon: Erle Crocker, MD;  Location: [facility];  Service: Orthopedics;  Laterality: Bilateral;  SURGERY REQUEST TIME 1 HOUR   TONSILLECTOMY  12/13/2002   Family History:  Family History  Problem Relation Age of Onset   Depression Mother    Stroke Mother    Fibromyalgia Mother    Osteoarthritis Mother    Asthma Brother    Depression Brother    Migraines Brother    Asperger's syndrome Brother    Other Brother        Ehlers-Danlos Syndrome   Cancer Maternal Aunt    Asperger's syndrome Maternal Aunt    Breast cancer Maternal Aunt    Heart disease Paternal Aunt    Heart disease Paternal Uncle    Cervical cancer Maternal Grandmother    Osteoporosis Maternal Grandmother  Died at 77   Osteoarthritis Maternal Grandmother    Colon cancer Maternal Grandfather    Asthma Maternal Grandfather    Asperger's syndrome Maternal Grandfather    Emphysema Maternal Grandfather        Died at 55   Prostate cancer Maternal Grandfather    Heart attack Paternal Grandfather        Died at 74   Asthma Paternal Grandfather    Tuberculosis Paternal Grandfather    Cancer Cousin    Asperger's syndrome Cousin    Asperger's syndrome Other    Heart Problems Paternal Grandmother        Died at 13   Ehlers-Danlos syndrome Father    Ehlers-Danlos syndrome Brother    Family Psychiatric  History: None reported Social History:  Social History   Substance and Sexual Activity  Alcohol Use No   Alcohol/week: 0.0 standard drinks     Social History   Substance and Sexual Activity  Drug Use No    Social History   Socioeconomic History   Marital status: Single    Spouse name: Not on file   Number of children: Not on file   Years of education: Not on file   Highest education level: Not on file  Occupational History   Not on file  Tobacco Use   Smoking status: Never   Smokeless tobacco: Never  Vaping Use   Vaping Use:  Never used  Substance and Sexual Activity   Alcohol use: No    Alcohol/week: 0.0 standard drinks   Drug use: No   Sexual activity: Never  Other Topics Concern   Not on file  Social History Narrative   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    She lives with her mother.    She enjoys reading, watching TV, writing, and painting.    Social Determinants of Health   Financial Resource Strain: Not on file  Food Insecurity: Not on file  Transportation Needs: Not on file  Physical Activity: Not on file  Stress: Not on file  Social Connections: Not on file   Additional Social History:    Allergies:   Allergies  Allergen Reactions   Augmentin  [Amoxicillin-Pot Clavulanate] Diarrhea   Chocolate Flavor     Other reaction(s): Other (See Comments) Wheezing, acne   Demerol [Meperidine] Other (See Comments)    Slow to wake up when this drug is given.    Keflex [Cephalexin] Nausea And Vomiting   Morphine And Related Nausea And Vomiting   Tape Dermatitis    Must use paper tape only   Toradol [Ketorolac Tromethamine] Nausea And Vomiting   Chocolate     GI distress   Nsaids     patient develops ulcers   Strawberry Extract     GI distress    Labs:  Results for orders placed or performed during the hospital encounter of 04/22/21 (from the past 48 hour(s))  HIV Antibody (routine testing w rflx)     Status: None   Collection Time: 04/23/21  6:24 AM  Result Value Ref Range   HIV Screen 4th Generation wRfx Non Reactive Non Reactive    Comment: Performed at [facility], [address], [place] [address]  CBC     Status: None   Collection Time: 04/23/21  6:24 AM  Result Value Ref Range   WBC 9.1 4.0 - 10.5 K/uL   RBC 4.19 3.87 - 5.11 MIL/uL   Hemoglobin 12.4 12.0 - 15.0 g/dL  HCT 36.0 36.0 - 46.0 %   MCV 85.9 80.0 - 100.0 fL   MCH 29.6 26.0 - 34.0 pg   MCHC 34.4 30.0 - 36.0 g/dL   RDW 14.2 11.5 - 15.5 %   Platelets 241 150 - 400 K/uL   nRBC 0.0 0.0 - 0.2 %     Comment: Performed at [facility], [address] [place], [place] [address]  Glucose, capillary     Status: Abnormal   Collection Time: 04/23/21  8:04 AM  Result Value Ref Range   Glucose-Capillary 101 (H) 70 - 99 mg/dL    Comment: Glucose reference range applies only to samples taken after fasting for at least 8 hours.  Glucose, capillary     Status: Abnormal   Collection Time: 04/24/21  9:30 AM  Result Value Ref Range   Glucose-Capillary 149 (H) 70 - 99 mg/dL    Comment: Glucose reference range applies only to samples taken after fasting for at least 8 hours.    Current Facility-Administered Medications  Medication Dose Route Frequency Provider Last Rate Last Admin   albuterol (PROVENTIL) (2.5 MG/3ML) 0.083% nebulizer solution 2.5 mg  2.5 mg Nebulization Q4H PRN Ivor Costa, MD       antiseptic oral rinse (BIOTENE) solution 15 mL  15 mL Mouth Rinse PRN Mansy, Jan A, MD       busPIRone (BUSPAR) tablet 10 mg  10 mg Oral BID Ivor Costa, MD   10 mg at 04/24/21 0844   busPIRone (BUSPAR) tablet 15 mg  15 mg Oral BID Ivor Costa, MD   15 mg at 04/24/21 0843   cefTRIAXone (ROCEPHIN) 1 g in sodium chloride 0.9 % 100 mL IVPB  1 g Intravenous Q24H Ivor Costa, MD 200 mL/hr at 04/24/21 0855 1 g at 04/24/21 0855   celecoxib (CELEBREX) capsule 200 mg  200 mg Oral BID Ivor Costa, MD   200 mg at 04/24/21 [id number]   Chlorhexidine Gluconate Cloth 2 % PADS 6 each  6 each Topical Daily Adhikari, Tamsen Meek, MD       cyclobenzaprine (FLEXERIL) tablet 5 mg  5 mg Oral TID PRN Ivor Costa, MD       diclofenac Sodium (VOLTAREN) 1 % topical gel 2 g  2 g Topical TID Ivor Costa, MD   2 g at 04/23/21 2233   DULoxetine (CYMBALTA) DR capsule 120 mg  120 mg Oral Daily Ivor Costa, MD   120 mg at 04/24/21 0842   enoxaparin (LOVENOX) injection 40 mg  40 mg Subcutaneous Q24H Ivor Costa, MD       fentaNYL (DURAGESIC) 100 MCG/HR 1 patch  1 patch Transdermal Q72H Ivor Costa, MD   1 patch at 04/22/21 2027   fluticasone  (FLONASE) 50 MCG/ACT nasal spray 1 spray  1 spray Each Nare Daily Ivor Costa, MD   1 spray at 04/23/21 0830   gabapentin (NEURONTIN) tablet 1,200 mg  1,200 mg Oral Daily Ivor Costa, MD   1,200 mg at 04/24/21 [id number]   gabapentin (NEURONTIN) tablet 1,800 mg  1,800 mg Oral QHS Ivor Costa, MD   1,800 mg at 04/23/21 2231   haloperidol (HALDOL) tablet 1 mg  1 mg Oral BID Shelly Coss, MD   1 mg at 04/24/21 0842   ketotifen (ZADITOR) 0.025 % ophthalmic solution 1 drop  1 drop Both Eyes BID Ivor Costa, MD   1 drop at 04/23/21 2231   loratadine (CLARITIN) tablet 10 mg  10 mg Oral Daily Ivor Costa, MD  10 mg at 04/24/21 0842   LORazepam (ATIVAN) injection 1 mg  1 mg Intravenous Q2H PRN Ivor Costa, MD       menthol-cetylpyridinium (CEPACOL) lozenge 3 mg  1 lozenge Oral PRN Shelly Coss, MD   3 mg at 04/23/21 1009   mometasone-formoterol (DULERA) 200-5 MCG/ACT inhaler 2 puff  2 puff Inhalation BID Ivor Costa, MD   2 puff at 04/24/21 0846   pantoprazole (PROTONIX) EC tablet 40 mg  40 mg Oral Daily Ivor Costa, MD   40 mg at 04/24/21 0842   pregabalin (LYRICA) capsule 300 mg  300 mg Oral BID Ivor Costa, MD   300 mg at 04/24/21 [id number]   QUEtiapine (SEROQUEL) tablet 200 mg  200 mg Oral QHS Ivor Costa, MD   200 mg at 04/23/21 2231   tamsulosin (FLOMAX) capsule 0.4 mg  0.4 mg Oral Daily Ivor Costa, MD   0.4 mg at 04/24/21 [id number]    Musculoskeletal: Strength & Muscle Tone: decreased Gait & Station: did not witness Patient leans: N/A  Psychiatric Specialty Exam: Physical Exam Vitals and nursing note reviewed.  Constitutional:      Appearance: Normal appearance.  HENT:     Head: Normocephalic and atraumatic.     Mouth/Throat:     Pharynx: Oropharynx is clear.  Eyes:     Pupils: Pupils are equal, round, and reactive to light.  Cardiovascular:     Rate and Rhythm: Normal rate and regular rhythm.  Pulmonary:     Effort: Pulmonary effort is normal.     Breath sounds: Normal breath sounds.  Abdominal:      General: Abdomen is flat.     Palpations: Abdomen is soft.  Musculoskeletal:        General: Normal range of motion.  Skin:    General: Skin is warm and dry.  Neurological:     General: No focal deficit present.     Mental Status: She is alert. Mental status is at baseline.  Psychiatric:        Attention and Perception: Attention normal.        Mood and Affect: Mood normal. Affect is blunt.        Speech: Speech normal.        Behavior: Behavior normal. Behavior is cooperative.        Thought Content: Thought content normal. Thought content does not include homicidal or suicidal ideation.        Cognition and Memory: Cognition and memory normal.        Judgment: Judgment normal.    Review of Systems  Constitutional: Negative.   HENT: Negative.    Eyes: Negative.   Respiratory: Negative.    Cardiovascular: Negative.   Gastrointestinal: Negative.   Musculoskeletal: Negative.   Skin: Negative.   Neurological: Negative.   Psychiatric/Behavioral:  Positive for depression. Negative for hallucinations, substance abuse and suicidal ideas. The patient is not nervous/anxious.   All other systems reviewed and are negative.  Blood pressure 126/68, pulse 87, temperature 99.1 F (37.3 C), resp. rate 18, height '4\' 11"'$  (1.499 m), weight 59.9 kg, SpO2 98 %.Body mass index is 26.67 kg/m.  General Appearance: Casual  Eye Contact:  Good  Speech:  Normal Rate  Volume:  Normal  Mood:  Depressed  Affect:  Blunt  Thought Process:  Coherent and Descriptions of Associations: Intact  Orientation:  Full (Time, Place, and Person)  Thought Content:  WDL and Logical  Suicidal Thoughts:  No  Homicidal Thoughts:  No  Memory:  Immediate;   Good Recent;   Fair Remote;   Good  Judgement:  Fair  Insight:  Fair  Psychomotor Activity:  Decreased  Concentration:  Concentration: Good and Attention Span: Good  Recall:  [employer] of Knowledge:  Fair  Language:  Good  Akathisia:  No  Handed:  Right   AIMS (if indicated):     Assets:  Housing Leisure Time Resilience Social Support  ADL's:  Impaired  Cognition:  WNL  Sleep:        Physical Exam: Physical Exam Vitals and nursing note reviewed.  Constitutional:      Appearance: Normal appearance.  HENT:     Head: Normocephalic and atraumatic.     Mouth/Throat:     Pharynx: Oropharynx is clear.  Eyes:     Pupils: Pupils are equal, round, and reactive to light.  Cardiovascular:     Rate and Rhythm: Normal rate and regular rhythm.  Pulmonary:     Effort: Pulmonary effort is normal.     Breath sounds: Normal breath sounds.  Abdominal:     General: Abdomen is flat.     Palpations: Abdomen is soft.  Musculoskeletal:        General: Normal range of motion.  Skin:    General: Skin is warm and dry.  Neurological:     General: No focal deficit present.     Mental Status: She is alert. Mental status is at baseline.  Psychiatric:        Attention and Perception: Attention normal.        Mood and Affect: Mood normal. Affect is blunt.        Speech: Speech normal.        Behavior: Behavior normal. Behavior is cooperative.        Thought Content: Thought content normal. Thought content does not include homicidal or suicidal ideation.        Cognition and Memory: Cognition and memory normal.        Judgment: Judgment normal.   Review of Systems  Constitutional: Negative.   HENT: Negative.    Eyes: Negative.   Respiratory: Negative.    Cardiovascular: Negative.   Gastrointestinal: Negative.   Musculoskeletal: Negative.   Skin: Negative.   Neurological: Negative.   Psychiatric/Behavioral:  Positive for depression. Negative for hallucinations, substance abuse and suicidal ideas. The patient is not nervous/anxious.   All other systems reviewed and are negative. Blood pressure 126/68, pulse 87, temperature 99.1 F (37.3 C), resp. rate 18, height '4\' 11"'$  (1.499 m), weight 59.9 kg, SpO2 98 %. Body mass index is 26.67  kg/m.  Treatment Plan Summary: Plan difficult situation.  Patient probably does not meet commitment criteria.  Reviewed with the mother that psychiatric illness can be potentially treated at home if the patient is not acutely dangerous.  Patient's insight is poor and she is irritable and unwilling to engage in much discussion about mental health treatment.  For now no change to current antidepressant and antipsychotic medication.  Please consult psychiatric team over the weekend for follow-up assessment.  Patient would not be appropriate for our inpatient unit in any case and would be difficult to place if we did try to admit her to the hospital. Major depressive disorder, recurrent, moderate: -Continue Cymbalta 120 mg daily -Continue Seroquel 200 mg at bedtime -Continue Haldol 1 mg BID -Psychiatrically stable to follow up outpatient  Anxiety: -Continue Buspar 25 mg BID  ADHD: -Continue Adderall ER 15 mg daily  Disposition: No evidence of imminent risk to self or others at present.   Supportive therapy provided about ongoing stressors. Discussed crisis plan, support from social network, calling 911, coming to the Emergency Department, and calling Suicide Hotline.  Waylan Boga, NP 04/24/2021 10:39 AM

## 2021-04-26 ENCOUNTER — Other Ambulatory Visit: Payer: Self-pay | Admitting: Family Medicine

## 2021-04-26 NOTE — Telephone Encounter (Signed)
Medication Refill - Medication: Fentanyl   Has the patient contacted their pharmacy? Yes.   Pts mother called stating that the pharmacy is needing to have a new prescription sent in. She states that the pt is completely out of this medication. She states that the pt has had a psychotic break and that she is not sure when the patch was last changed on pt. She states that she is needing to have this sent in today to make sure that the pt does not go without. She states that she would like to have a call back once this is done. Please advise.  (Agent: If no, request that the patient contact the pharmacy for the refill.) (Agent: If yes, when and what did the pharmacy advise?)  Preferred Pharmacy (with phone number or street name):  [name] [id number] Phone: [phone]: [phone] Hours: Not open 24 hours    Agent: Please be advised that RX refills may take up to 3 business days. We ask that you follow-up with your pharmacy.

## 2021-04-26 NOTE — Telephone Encounter (Signed)
Requested medications are due for refill today.  yes  Requested medications are on the active medications list.  yes  Last refill. 03/22/2021  Future visit scheduled.   no  Notes to clinic.  Medication is not delegated.

## 2021-04-26 NOTE — Telephone Encounter (Signed)
error 

## 2021-04-27 ENCOUNTER — Emergency Department
Admission: EM | Admit: 2021-04-27 | Discharge: 2021-04-28 | Disposition: A | Discharge status: Home / Self Care | Payer: Medicare Other | Attending: Student in an Organized Health Care Education/Training Program | Admitting: Student in an Organized Health Care Education/Training Program

## 2021-04-27 ENCOUNTER — Emergency Department: Payer: Medicare Other

## 2021-04-27 ENCOUNTER — Telehealth (INDEPENDENT_AMBULATORY_CARE_PROVIDER_SITE_OTHER): Payer: Self-pay | Admitting: Pediatrics

## 2021-04-27 DIAGNOSIS — G9341 Metabolic encephalopathy: Secondary | ICD-10-CM

## 2021-04-27 DIAGNOSIS — R4182 Altered mental status, unspecified: Secondary | ICD-10-CM | POA: Diagnosis not present

## 2021-04-27 DIAGNOSIS — G40909 Epilepsy, unspecified, not intractable, without status epilepticus: Secondary | ICD-10-CM | POA: Diagnosis not present

## 2021-04-27 DIAGNOSIS — R569 Unspecified convulsions: Secondary | ICD-10-CM | POA: Insufficient documentation

## 2021-04-27 DIAGNOSIS — G4700 Insomnia, unspecified: Secondary | ICD-10-CM | POA: Diagnosis present

## 2021-04-27 DIAGNOSIS — J45909 Unspecified asthma, uncomplicated: Secondary | ICD-10-CM | POA: Diagnosis not present

## 2021-04-27 DIAGNOSIS — F202 Catatonic schizophrenia: Secondary | ICD-10-CM | POA: Diagnosis not present

## 2021-04-27 DIAGNOSIS — Z7952 Long term (current) use of systemic steroids: Secondary | ICD-10-CM | POA: Insufficient documentation

## 2021-04-27 DIAGNOSIS — Z20822 Contact with and (suspected) exposure to covid-19: Secondary | ICD-10-CM | POA: Insufficient documentation

## 2021-04-27 DIAGNOSIS — Z79899 Other long term (current) drug therapy: Secondary | ICD-10-CM | POA: Diagnosis not present

## 2021-04-27 DIAGNOSIS — R0902 Hypoxemia: Secondary | ICD-10-CM | POA: Diagnosis not present

## 2021-04-27 DIAGNOSIS — G44221 Chronic tension-type headache, intractable: Secondary | ICD-10-CM | POA: Diagnosis present

## 2021-04-27 DIAGNOSIS — R442 Other hallucinations: Secondary | ICD-10-CM | POA: Diagnosis not present

## 2021-04-27 DIAGNOSIS — R0689 Other abnormalities of breathing: Secondary | ICD-10-CM | POA: Diagnosis not present

## 2021-04-27 DIAGNOSIS — R4020 Unspecified coma: Secondary | ICD-10-CM | POA: Diagnosis not present

## 2021-04-27 DIAGNOSIS — M5136 Other intervertebral disc degeneration, lumbar region: Secondary | ICD-10-CM | POA: Diagnosis present

## 2021-04-27 DIAGNOSIS — F061 Catatonic disorder due to known physiological condition: Secondary | ICD-10-CM | POA: Insufficient documentation

## 2021-04-27 DIAGNOSIS — G44229 Chronic tension-type headache, not intractable: Secondary | ICD-10-CM | POA: Diagnosis present

## 2021-04-27 DIAGNOSIS — G4710 Hypersomnia, unspecified: Secondary | ICD-10-CM | POA: Diagnosis present

## 2021-04-27 DIAGNOSIS — G2569 Other tics of organic origin: Secondary | ICD-10-CM | POA: Diagnosis present

## 2021-04-27 DIAGNOSIS — M549 Dorsalgia, unspecified: Secondary | ICD-10-CM | POA: Diagnosis present

## 2021-04-27 DIAGNOSIS — F418 Other specified anxiety disorders: Secondary | ICD-10-CM | POA: Diagnosis not present

## 2021-04-27 DIAGNOSIS — M797 Fibromyalgia: Secondary | ICD-10-CM | POA: Diagnosis present

## 2021-04-27 DIAGNOSIS — R404 Transient alteration of awareness: Secondary | ICD-10-CM | POA: Diagnosis not present

## 2021-04-27 DIAGNOSIS — G8929 Other chronic pain: Secondary | ICD-10-CM | POA: Diagnosis present

## 2021-04-27 LAB — CK: Total CK: 40 U/L (ref 38–234)

## 2021-04-27 LAB — CBC WITH DIFFERENTIAL/PLATELET
Abs Immature Granulocytes: 0.03 10*3/uL (ref 0.00–0.07)
Basophils Absolute: 0.1 10*3/uL (ref 0.0–0.1)
Basophils Relative: 1 %
Eosinophils Absolute: 0 10*3/uL (ref 0.0–0.5)
Eosinophils Relative: 0 %
HCT: 45.5 % (ref 36.0–46.0)
Hemoglobin: 15.9 g/dL — ABNORMAL HIGH (ref 12.0–15.0)
Immature Granulocytes: 0 %
Lymphocytes Relative: 18 %
Lymphs Abs: 1.7 10*3/uL (ref 0.7–4.0)
MCH: 29.6 pg (ref 26.0–34.0)
MCHC: 34.9 g/dL (ref 30.0–36.0)
MCV: 84.7 fL (ref 80.0–100.0)
Monocytes Absolute: 0.3 10*3/uL (ref 0.1–1.0)
Monocytes Relative: 4 %
Neutro Abs: 7.4 10*3/uL (ref 1.7–7.7)
Neutrophils Relative %: 77 %
Platelets: 373 10*3/uL (ref 150–400)
RBC: 5.37 MIL/uL — ABNORMAL HIGH (ref 3.87–5.11)
RDW: 13.7 % (ref 11.5–15.5)
WBC: 9.6 10*3/uL (ref 4.0–10.5)
nRBC: 0 % (ref 0.0–0.2)

## 2021-04-27 LAB — URINE DRUG SCREEN, QUALITATIVE (ARMC ONLY)
Amphetamines, Ur Screen: NOT DETECTED
Barbiturates, Ur Screen: NOT DETECTED
Benzodiazepine, Ur Scrn: NOT DETECTED
Cannabinoid 50 Ng, Ur ~~LOC~~: NOT DETECTED
Cocaine Metabolite,Ur ~~LOC~~: NOT DETECTED
MDMA (Ecstasy)Ur Screen: NOT DETECTED
Methadone Scn, Ur: NOT DETECTED
Opiate, Ur Screen: NOT DETECTED
Phencyclidine (PCP) Ur S: NOT DETECTED
Tricyclic, Ur Screen: POSITIVE — AB

## 2021-04-27 LAB — URINALYSIS, COMPLETE (UACMP) WITH MICROSCOPIC
Bacteria, UA: NONE SEEN
Bilirubin Urine: NEGATIVE
Glucose, UA: NEGATIVE mg/dL
Hgb urine dipstick: NEGATIVE
Ketones, ur: 20 mg/dL — AB
Leukocytes,Ua: NEGATIVE
Nitrite: NEGATIVE
Protein, ur: NEGATIVE mg/dL
Specific Gravity, Urine: 1.013 (ref 1.005–1.030)
pH: 7 (ref 5.0–8.0)

## 2021-04-27 LAB — COMPREHENSIVE METABOLIC PANEL
ALT: 12 U/L (ref 0–44)
AST: 15 U/L (ref 15–41)
Albumin: 4.3 g/dL (ref 3.5–5.0)
Alkaline Phosphatase: 78 U/L (ref 38–126)
Anion gap: 15 (ref 5–15)
BUN: 14 mg/dL (ref 6–20)
CO2: 21 mmol/L — ABNORMAL LOW (ref 22–32)
Calcium: 9.7 mg/dL (ref 8.9–10.3)
Chloride: 105 mmol/L (ref 98–111)
Creatinine, Ser: 0.71 mg/dL (ref 0.44–1.00)
GFR, Estimated: >60 mL/min (ref >60)
Glucose, Bld: 106 mg/dL — ABNORMAL HIGH (ref 70–99)
Potassium: 3.8 mmol/L (ref 3.5–5.1)
Sodium: 141 mmol/L (ref 135–145)
Total Bilirubin: 0.9 mg/dL (ref 0.3–1.2)
Total Protein: 7.9 g/dL (ref 6.5–8.1)

## 2021-04-27 LAB — CULTURE, BLOOD (ROUTINE X 2)
Culture: NO GROWTH
Culture: NO GROWTH
Special Requests: ADEQUATE
Special Requests: ADEQUATE

## 2021-04-27 MED ORDER — HALOPERIDOL 1 MG PO TABS
1.0000 mg | ORAL_TABLET | Freq: Two times a day (BID) | ORAL | Status: DC
  Administered 2021-04-28 (×2): 1 mg via ORAL
  Filled 2021-04-27: qty 1
  Filled 2021-04-27: qty 2
  Filled 2021-04-27 (×2): qty 1

## 2021-04-27 MED ORDER — BENZTROPINE MESYLATE 1 MG/ML IJ SOLN
2.0000 mg | Freq: Once | INTRAMUSCULAR | Status: AC
  Administered 2021-04-27: 2 mg via INTRAVENOUS
  Filled 2021-04-27: qty 2

## 2021-04-27 MED ORDER — CEFDINIR 300 MG PO CAPS
300.0000 mg | ORAL_CAPSULE | Freq: Two times a day (BID) | ORAL | Status: DC
  Administered 2021-04-28 (×2): 300 mg via ORAL
  Filled 2021-04-27 (×3): qty 1

## 2021-04-27 MED ORDER — CYCLOBENZAPRINE HCL 10 MG PO TABS
5.0000 mg | ORAL_TABLET | Freq: Three times a day (TID) | ORAL | Status: DC | PRN
  Administered 2021-04-28: 5 mg via ORAL
  Filled 2021-04-27: qty 1

## 2021-04-27 MED ORDER — DIAZEPAM 5 MG/ML IJ SOLN: 5.0000 mg | Freq: Once | INTRAMUSCULAR | Status: DC

## 2021-04-27 MED ORDER — BENZTROPINE MESYLATE 1 MG/ML IJ SOLN
2.0000 mg | Freq: Once | INTRAMUSCULAR | Status: DC
  Filled 2021-04-27: qty 2

## 2021-04-27 MED ORDER — QUETIAPINE FUMARATE 200 MG PO TABS
200.0000 mg | ORAL_TABLET | Freq: Every day | ORAL | Status: DC
  Administered 2021-04-28: 200 mg via ORAL
  Filled 2021-04-27: qty 1

## 2021-04-27 MED ORDER — FLUTICASONE PROPIONATE HFA 110 MCG/ACT IN AERO: 23.0000 | INHALATION_SPRAY | Freq: Two times a day (BID) | RESPIRATORY_TRACT | Status: DC

## 2021-04-27 MED ORDER — PANTOPRAZOLE SODIUM 40 MG PO TBEC
40.0000 mg | DELAYED_RELEASE_TABLET | Freq: Every day | ORAL | Status: DC
  Administered 2021-04-28: 40 mg via ORAL
  Filled 2021-04-27: qty 1

## 2021-04-27 MED ORDER — MOMETASONE FURO-FORMOTEROL FUM 200-5 MCG/ACT IN AERO
2.0000 | INHALATION_SPRAY | Freq: Two times a day (BID) | RESPIRATORY_TRACT | Status: DC
  Administered 2021-04-28: 2 via RESPIRATORY_TRACT
  Filled 2021-04-27: qty 8.8

## 2021-04-27 MED ORDER — BUSPIRONE HCL 5 MG PO TABS
15.0000 mg | ORAL_TABLET | Freq: Two times a day (BID) | ORAL | Status: DC
  Administered 2021-04-28: 15 mg via ORAL
  Filled 2021-04-27 (×2): qty 3

## 2021-04-27 MED ORDER — FENTANYL 100 MCG/HR TD PT72: 1.0000 | MEDICATED_PATCH | TRANSDERMAL | 0 refills | Status: DC

## 2021-04-27 MED ORDER — FENTANYL 100 MCG/HR TD PT72
1.0000 | MEDICATED_PATCH | TRANSDERMAL | Status: DC
  Administered 2021-04-27: 1 via TRANSDERMAL
  Filled 2021-04-27: qty 1

## 2021-04-27 MED ORDER — DIAZEPAM 5 MG/ML IJ SOLN
5.0000 mg | Freq: Once | INTRAMUSCULAR | Status: AC
  Administered 2021-04-27: 5 mg via INTRAVENOUS
  Filled 2021-04-27: qty 2

## 2021-04-27 MED ORDER — BUSPIRONE HCL 5 MG PO TABS
10.0000 mg | ORAL_TABLET | Freq: Two times a day (BID) | ORAL | Status: DC
  Administered 2021-04-28: 10 mg via ORAL
  Filled 2021-04-27 (×2): qty 2

## 2021-04-27 NOTE — ED Notes (Signed)
Pt called out asking for water. Water was given

## 2021-04-27 NOTE — ED Notes (Signed)
After medication, pt now responding to Rns questions, looking around room, and follow commands

## 2021-04-27 NOTE — Telephone Encounter (Signed)
I took the call as requested. Mom said that Karen [name] has not been responding to her since yesterday. She said that she was recently admitted to [facility] for UTI and seemed to be back to herself for 2 days after admission, then she has gradually been not behaving normally. She said that yesterday she was supposed to go somewhere with her mother, but refused to go and the only thing she would say was to call her brother. Mom did so and he told her to do as Mom said. She continued to refused to go and then later in the day she was in bed in a flexed position and has since been unresponsive to Mom trying to get her attention, including pinching her. Mom said that Karen [name] hasn't had anything to eat or drink and hasn't urinated since the behavior began yesterday. Mom asked if she should "wait it out" or send her to ED. I instructed Mom to call 911 and have patient transported to ED. She agreed with this plan. TG

## 2021-04-27 NOTE — Telephone Encounter (Signed)
  Who's calling (name and relationship to patient) : Mom  Best contact number: [phone]  Provider they see: Dr. Gaynell Face  Reason for call: Mom states that patient is catatonic. The last time this happened she was hospitalized with a UTI that had traveled to her brain. Mom would like advice on whether she should take patient to ER or wait it out.     PRESCRIPTION REFILL ONLY  Name of prescription:  Pharmacy:

## 2021-04-27 NOTE — ED Provider Notes (Signed)
[facility] Emergency Department Provider Note    Event Date/Time   First MD Initiated Contact with Patient 04/27/21 1554     (approximate)  I have reviewed the triage vital signs and the nursing notes.   HISTORY  Chief Complaint No chief complaint on file.  Level V Caveat:  AMS  HPI Karen [name] is a 45 y.o. female with the below listed past medical history presents to the ER for evaluation of altered mental status last being seen normal yesterday at 10 PM.  Found unresponsive and rigid.  Had similar presentation with recent admission with extensive work-up without clear etiology.  Has had extensive neuro work-up for seizure-like activity was found to be nonepileptic pseudoseizure.  Has been having some adjustments to her psychiatric medications has had multiple admissions for psychiatric treatment.  No report of any overdose.  She arrives not following commands will blank.  Does appear rigid.  Past Medical History:  Diagnosis Date   Ehlers-Danlos syndrome    Fibromyalgia    Headache    Sleep apnea    Tics of organic origin    Transient alteration of awareness    Family History  Problem Relation Age of Onset   Depression Mother    Stroke Mother    Fibromyalgia Mother    Osteoarthritis Mother    Asthma Brother    Depression Brother    Migraines Brother    Asperger's syndrome Brother    Other Brother        Ehlers-Danlos Syndrome   Cancer Maternal Aunt    Asperger's syndrome Maternal Aunt    Breast cancer Maternal Aunt    Heart disease Paternal Aunt    Heart disease Paternal Uncle    Cervical cancer Maternal Grandmother    Osteoporosis Maternal Grandmother        Died at 4   Osteoarthritis Maternal Grandmother    Colon cancer Maternal Grandfather    Asthma Maternal Grandfather    Asperger's syndrome Maternal Grandfather    Emphysema Maternal Grandfather        Died at 72   Prostate cancer Maternal Grandfather    Heart attack  Paternal Grandfather        Died at 67   Asthma Paternal Grandfather    Tuberculosis Paternal Grandfather    Cancer Cousin    Asperger's syndrome Cousin    Asperger's syndrome Other    Heart Problems Paternal Grandmother        Died at 59   Ehlers-Danlos syndrome Father    Ehlers-Danlos syndrome Brother    Past Surgical History:  Procedure Laterality Date   COLONOSCOPY WITH PROPOFOL N/A 02/13/2018   Procedure: COLONOSCOPY WITH PROPOFOL;  Surgeon: [name], MD;  Location: [facility] ENDOSCOPY;  Service: Endoscopy;  Laterality: N/A;   DILATION AND CURETTAGE OF UTERUS  2009   ESOPHAGOGASTRODUODENOSCOPY (EGD) WITH PROPOFOL  02/13/2018   Procedure: ESOPHAGOGASTRODUODENOSCOPY (EGD) WITH PROPOFOL;  Surgeon: [name], MD;  Location: [facility];  Service: Endoscopy;;   EYE SURGERY Left 1990   3 Surgeries on left eye to correct crossed eye   LAPAROSCOPY Left 2003   Fallopian Tube   [place] HISTORY Right 1987   3 Surgeries to repair broken arm   OTHER SURGICAL HISTORY Left    3 surgeries on her left thigh as an infant   OTHER SURGICAL HISTORY  1998   Jaw surgery   Right arm surgery  1987  x 3 due to fracture   TOENAIL EXCISION Bilateral 06/13/2019   Procedure: BILATERAL SECOND TOE PARTIAL NAIL ABLATION;  Surgeon: Erle Crocker, MD;  Location: [facility];  Service: Orthopedics;  Laterality: Bilateral;  SURGERY REQUEST TIME 1 HOUR   TONSILLECTOMY  12/13/2002   Patient Active Problem List   Diagnosis Date Noted   Acute metabolic encephalopathy [id number]   Asthma 04/22/2021   Paranoid ([place]) 04/22/2021   Psychosis ([place]) 04/22/2021   UTI (urinary tract infection) 04/22/2021   Sepsis ([place]) 04/22/2021   Urinary retention 04/22/2021   Chronic tension type headache 06/04/2020   Breast lump on left side at 1 o'clock position 10/09/2019   Fibrocystic breast changes, bilateral 10/09/2019   Family history of colon cancer    Rectal  polyp    Heartburn    Excessive somnolence disorder 06/28/2017   Seizures ([place]) 09/12/2016   Difficulty hearing 08/31/2015   Back pain, chronic 08/31/2015   Rheumatoid arthritis ([place]) 08/31/2015   Alopecia 03/10/2015   Anemia 03/10/2015   Arthritis 03/10/2015   Allergic asthma, mild persistent, uncomplicated [id number]   Chronic nausea 03/10/2015   DDD (degenerative disc disease), lumbar 03/10/2015   Depression with anxiety 03/10/2015   Personal history of MRSA (methicillin resistant Staphylococcus aureus) 03/10/2015   Insomnia 03/10/2015   Mitral valve prolapse 03/10/2015   Paresthesias/numbness 03/10/2015   Allergic rhinitis, seasonal 03/10/2015   Seizure disorder ([place]) 03/10/2015   Mixed sleep apnea, Central predominant 03/10/2015   Migraine without aura 01/27/2014   Fibromyalgia 01/27/2014   Transient alteration of awareness 01/07/2014   Tics of organic origin 01/07/2014   Backache, unspecified 01/07/2014   Ehlers-Danlos syndrome 01/07/2014      Prior to Admission medications   Medication Sig Start Date End Date Taking? Authorizing Provider  albuterol (VENTOLIN HFA) 108 (90 Base) MCG/ACT inhaler Inhale 2 puffs into the lungs every 6 (six) hours as needed. 06/27/19   Deneise Lever, MD  azelastine (OPTIVAR) 0.05 % ophthalmic solution Apply to eye as directed. 1 drop in each eye prn 02/13/20   [provider]  busPIRone (BUSPAR) 10 MG tablet Take 1 tablet (10 mg total) by mouth 2 (two) times daily. Take along with '15mg'$  tablet twice a day 03/23/21   Birdie Sons, MD  busPIRone (BUSPAR) 15 MG tablet TAKE 1 TABLET BY MOUTH 2 TIMES DAILY. 12/30/20   Birdie Sons, MD  cefdinir (OMNICEF) 300 MG capsule Take 1 capsule (300 mg total) by mouth every 12 (twelve) hours for 2 days. 04/25/21 04/27/21  Shelly Coss, MD  celecoxib (CELEBREX) 200 MG capsule TAKE 1 CAPSULE BY MOUTH TWICE A DAY 03/01/21   Birdie Sons, MD  cyclobenzaprine (FLEXERIL) 5 MG tablet Take 1 tablet  (5 mg total) by mouth 3 (three) times daily as needed for muscle spasms. Do not take within 6 hours of taking tizandine 03/26/21   Birdie Sons, MD  diclofenac Sodium (VOLTAREN) 1 % GEL APPLY AS NEEDED 3 TIMES A DAY 11/16/20   Birdie Sons, MD  DULoxetine (CYMBALTA) 60 MG capsule TAKE 2 CAPSULES BY MOUTH EVERY DAY 02/03/21   Birdie Sons, MD  fentaNYL (DURAGESIC) 100 MCG/HR Place 1 patch onto the skin every 3 (three) days. 04/27/21   Birdie Sons, MD  fexofenadine (ALLEGRA) 180 MG tablet Take 180 mg by mouth daily.    [provider]  fluticasone (FLONASE) 50 MCG/ACT nasal spray SPRAY 2 SPRAYS INTO EACH NOSTRIL EVERY DAY 05/10/18  Chrismon, Vickki Muff, PA-C  fluticasone (FLOVENT HFA) 110 MCG/ACT inhaler Inhale 23 puffs into the lungs 2 (two) times daily. 04/25/21   [provider]  Fluticasone-Salmeterol (ADVAIR) 250-50 MCG/DOSE AEPB Inhale 1 puff into the lungs every 12 (twelve) hours. Rinse mouth after use 04/02/20   Young, Tarri Fuller D, MD  gabapentin (NEURONTIN) 600 MG tablet TAKE 2 TABLETS IN THE MORNING AND 3 TABLETS AT BEDTIME 01/15/21   Jodi Geralds, MD  haloperidol (HALDOL) 1 MG tablet Take 1 mg by mouth 2 (two) times daily. 04/15/21   [provider]  omeprazole (PRILOSEC) 40 MG capsule TAKE 1 CAPSULE (40 MG TOTAL) BY MOUTH DAILY. 04/10/21   Birdie Sons, MD  ondansetron (ZOFRAN-ODT) 4 MG disintegrating tablet DISSOLVE 1 TABLET IN MOUTH EVERY 8 HOURS FOR NAUSEA AND VOMITING 09/29/20   Birdie Sons, MD  pregabalin (LYRICA) 300 MG capsule Take 1 capsule (300 mg total) by mouth 2 (two) times daily. 12/30/20   Birdie Sons, MD  QUEtiapine (SEROQUEL) 200 MG tablet TAKE 1 TABLET BY MOUTH AT BEDTIME. 12/30/20   Birdie Sons, MD  tamsulosin (FLOMAX) 0.4 MG CAPS capsule Take 1 capsule (0.4 mg total) by mouth daily. 02/22/21   MacDiarmid, Nicki Reaper, MD  tiZANidine (ZANAFLEX) 4 MG tablet TAKE 0.5-1 TABLETS (2-4 MG TOTAL) BY MOUTH EVERY 8 (EIGHT) HOURS AS NEEDED  FOR MUSCLE SPASMS. 04/14/21   Birdie Sons, MD    Allergies Augmentin  [amoxicillin-pot clavulanate], Chocolate flavor, Demerol [meperidine], Keflex [cephalexin], Morphine and related, Tape, Toradol [ketorolac tromethamine], Chocolate, Nsaids, and Strawberry extract    Social History Social History   Tobacco Use   Smoking status: Never   Smokeless tobacco: Never  Vaping Use   Vaping Use: Never used  Substance Use Topics   Alcohol use: No    Alcohol/week: 0.0 standard drinks   Drug use: No    Review of Systems Patient denies headaches, rhinorrhea, blurry vision, numbness, shortness of breath, chest pain, edema, cough, abdominal pain, nausea, vomiting, diarrhea, dysuria, fevers, rashes or hallucinations unless otherwise stated above in HPI. ____________________________________________   PHYSICAL EXAM:  VITAL SIGNS: Vitals:   04/27/21 1800 04/27/21 1830  BP: (!) 143/96 138/83  Pulse: 77 89  Resp: 17 (!) 28  Temp:    SpO2: 100% 98%    Constitutional: eyes open, not responding to commands Eyes: Conjunctivae are normal. Pupils 56m and reactive Head: Atraumatic. Nose: No congestion/rhinnorhea. Mouth/Throat: Mucous membranes are moist.   Neck: No stridor. Painless ROM.  Cardiovascular: mildly tachycardic, regular rhythm. Grossly normal heart sounds.  Good peripheral circulation. Respiratory: Normal respiratory effort.  No retractions. Lungs CTAB. Gastrointestinal: Soft and nontender. No distention. No abdominal bruits. No CVA tenderness. Genitourinary: deferred Musculoskeletal: no deformity noted Neurologic:  eyes open, not following commands, Bilat UE held in 90 flexion and resisting movement, Bilat LE held plantar flexion with 5 beat clonus Skin:  Skin is warm, dry and intact. No rash noted. Psychiatric: unable to assess  ____________________________________________   LABS (all labs ordered are listed, but only abnormal results are displayed)  Results for  orders placed or performed during the hospital encounter of 04/27/21 (from the past 24 hour(s))  CBC with Differential     Status: Abnormal   Collection Time: 04/27/21  4:04 PM  Result Value Ref Range   WBC 9.6 4.0 - 10.5 K/uL   RBC 5.37 (H) 3.87 - 5.11 MIL/uL   Hemoglobin 15.9 (H) 12.0 - 15.0 g/dL   HCT 45.5 36.0 -  46.0 %   MCV 84.7 80.0 - 100.0 fL   MCH 29.6 26.0 - 34.0 pg   MCHC 34.9 30.0 - 36.0 g/dL   RDW 13.7 11.5 - 15.5 %   Platelets 373 150 - 400 K/uL   nRBC 0.0 0.0 - 0.2 %   Neutrophils Relative % 77 %   Neutro Abs 7.4 1.7 - 7.7 K/uL   Lymphocytes Relative 18 %   Lymphs Abs 1.7 0.7 - 4.0 K/uL   Monocytes Relative 4 %   Monocytes Absolute 0.3 0.1 - 1.0 K/uL   Eosinophils Relative 0 %   Eosinophils Absolute 0.0 0.0 - 0.5 K/uL   Basophils Relative 1 %   Basophils Absolute 0.1 0.0 - 0.1 K/uL   Immature Granulocytes 0 %   Abs Immature Granulocytes 0.03 0.00 - 0.07 K/uL  Comprehensive metabolic panel     Status: Abnormal   Collection Time: 04/27/21  4:04 PM  Result Value Ref Range   Sodium 141 135 - 145 mmol/L   Potassium 3.8 3.5 - 5.1 mmol/L   Chloride 105 98 - 111 mmol/L   CO2 21 (L) 22 - 32 mmol/L   Glucose, Bld 106 (H) 70 - 99 mg/dL   BUN 14 6 - 20 mg/dL   Creatinine, Ser 0.71 0.44 - 1.00 mg/dL   Calcium 9.7 8.9 - 10.3 mg/dL   Total Protein 7.9 6.5 - 8.1 g/dL   Albumin 4.3 3.5 - 5.0 g/dL   AST 15 15 - 41 U/L   ALT 12 0 - 44 U/L   Alkaline Phosphatase 78 38 - 126 U/L   Total Bilirubin 0.9 0.3 - 1.2 mg/dL   GFR, Estimated >60 >60 mL/min   Anion gap 15 5 - 15  CK     Status: None   Collection Time: 04/27/21  4:04 PM  Result Value Ref Range   Total CK 40 38 - 234 U/L  Urine Drug Screen, Qualitative ([facility] only)     Status: Abnormal   Collection Time: 04/27/21  4:06 PM  Result Value Ref Range   Tricyclic, Ur Screen POSITIVE (A) NONE DETECTED   Amphetamines, Ur Screen NONE DETECTED NONE DETECTED   MDMA (Ecstasy)Ur Screen NONE DETECTED NONE DETECTED   Cocaine  Metabolite,Ur [place] NONE DETECTED NONE DETECTED   Opiate, Ur Screen NONE DETECTED NONE DETECTED   Phencyclidine (PCP) Ur S NONE DETECTED NONE DETECTED   Cannabinoid 50 Ng, Ur [place] NONE DETECTED NONE DETECTED   Barbiturates, Ur Screen NONE DETECTED NONE DETECTED   Benzodiazepine, Ur Scrn NONE DETECTED NONE DETECTED   Methadone Scn, Ur NONE DETECTED NONE DETECTED  Urinalysis, Complete w Microscopic     Status: Abnormal   Collection Time: 04/27/21  4:06 PM  Result Value Ref Range   Color, Urine YELLOW (A) YELLOW   APPearance HAZY (A) CLEAR   Specific Gravity, Urine 1.013 1.005 - 1.030   pH 7.0 5.0 - 8.0   Glucose, UA NEGATIVE NEGATIVE mg/dL   Hgb urine dipstick NEGATIVE NEGATIVE   Bilirubin Urine NEGATIVE NEGATIVE   Ketones, ur 20 (A) NEGATIVE mg/dL   Protein, ur NEGATIVE NEGATIVE mg/dL   Nitrite NEGATIVE NEGATIVE   Leukocytes,Ua NEGATIVE NEGATIVE   RBC / HPF 0-5 0 - 5 RBC/hpf   WBC, UA 0-5 0 - 5 WBC/hpf   Bacteria, UA NONE SEEN NONE SEEN   Squamous Epithelial / LPF 0-5 0 - 5   Mucus PRESENT    Amorphous Crystal PRESENT    ____________________________________________  EKG  My review and personal interpretation at Time: 1600   Indication: ams  Rate: 105  Rhythm: sinus Axis: normal Other: normal intervals, no stemi ____________________________________________  RADIOLOGY  I personally reviewed all radiographic images ordered to evaluate for the above acute complaints and reviewed radiology reports and findings.  These findings were personally discussed with the patient.  Please see medical record for radiology report.  ____________________________________________   PROCEDURES  Procedure(s) performed:  Procedures    Critical Care performed: no ____________________________________________   INITIAL IMPRESSION / ASSESSMENT AND PLAN / ED COURSE  Pertinent labs & imaging results that were available during my care of the patient were reviewed by me and considered in my medical  decision making (see chart for details).   DDX: Dehydration, sepsis, pna, uti, hypoglycemia, cva, drug effect, withdrawal, encephalitis   YANARA SCHENK is a 45 y.o. who presents to the ED with presentation as described above.  Patient catatonic given benzos well as benztropine with resolution of her symptoms.  Poorly is having hallucinations at home according to mother.  Is not consistent with seizures had extensive neurologic work-up recent hospitalization.  Not consistent with infectious process or encephalitis.  Suspect underlying psychiatric disease possible symptoms are related to recent medication adjustments she not having any SI or HI.  Does not meet criteria for involuntary commitment.  Clinical Course as of 04/27/21 2242  Tue Apr 27, 2021  1713 On reexamination patient's mentation significantly improved.  Patient states that she is withdrawing from fentanyl.  Denies any hallucinations.  Blood work is reassuring.  Awaiting UDS and UA.  She denies any SI or HI.  Will consult psychiatry. [PR]  1752 Patient requesting something to drink.  Not c/w serotonin syndrome or NMS.  On review on chart appears more c/w episode of catatonia which she has had in the past.  [PR]  1939 Patient states that this is all because she is needing to change her fentanyl patch and having withdrawals.  Mother at bedside states that the patient is becoming increasingly paranoid saying that her brothers are watching her.  Patient denies any hallucinations.  States that she is not paranoid. [PR]  2237 Patient evaluated by psychiatry recommends observation overnight due to a catatonia episodes possible psychiatric admission.  Have reordered her home meds. [PR]  2237 The patient has been placed in psychiatric observation due to the need to provide a safe environment for the patient while obtaining psychiatric consultation and evaluation, as well as ongoing medical and medication management to treat the patient's  condition.  The patient has not been placed under full IVC at this time.   [PR]    Clinical Course User Index [PR] Merlyn Lot, MD    The patient was evaluated in Emergency Department today for the symptoms described in the history of present illness. He/she was evaluated in the context of the global COVID-19 pandemic, which necessitated consideration that the patient might be at risk for infection with the SARS-CoV-2 virus that causes COVID-19. Institutional protocols and algorithms that pertain to the evaluation of patients at risk for COVID-19 are in a state of rapid change based on information released by regulatory bodies including the CDC and federal and state organizations. These policies and algorithms were followed during the patient's care in the ED.  As part of my medical decision making, I reviewed the following data within the [facility] notes reviewed and incorporated, Labs reviewed, notes from prior ED visits and [place] Controlled Substance Database  ____________________________________________   FINAL CLINICAL IMPRESSION(S) / ED DIAGNOSES  Final diagnoses:  Catatonia      NEW MEDICATIONS STARTED DURING THIS VISIT:  New Prescriptions   No medications on file     Note:  This document was prepared using Dragon voice recognition software and may include unintentional dictation errors.    Merlyn Lot, MD 04/27/21 2242

## 2021-04-27 NOTE — Telephone Encounter (Signed)
Spoke with mom. She informs that Karen [name] has been catatonic for a few hours now. She is not working to breath, and her hands have a tremor.   Mom states patient has been hiding her medication since her hospitalization so they are unsure if she has been taking her medications appropriately. This medical assistant transferred the call to Rockwell Germany, NP the on call provider. Please see her note for further details.

## 2021-04-27 NOTE — ED Notes (Signed)
Patient transported to CT 

## 2021-04-27 NOTE — ED Notes (Signed)
Mother leaving at this time; pt given call bell and encouraged to call if needed

## 2021-04-27 NOTE — ED Notes (Signed)
Pt stating her fentanyl patch is a "placebo" and is requesting dilaudid

## 2021-04-27 NOTE — ED Notes (Signed)
Pt called out to use the bathroom. Pt was put on bedpan. Pt stated she couldn't go on the bedpan and was taken off of it. Pt was instructed to use call light again if needing to use the bathroom.

## 2021-04-27 NOTE — ED Notes (Signed)
Pt assisted to bsc and back to bed with one person assist; pt given pair of mesh panties per request; mom in room at bedside

## 2021-04-27 NOTE — ED Triage Notes (Signed)
Pt arrives to ED from home via [facility]. Pt is contracted and is only responsive to painful stimuli. Mother states that pt was found in her room in this state and her last normal was 10 pm last night.

## 2021-04-27 NOTE — ED Notes (Signed)
Pt stating her brother "Karen [name]" is outside the door; pt informed he is not here and there are pt's in the hallway; pt states "I know what my brother sounds like, they will not let him pass"; pt had BM in toilet and states "I want it tested for roundworms, do not flush the toilet until me or Karen [name] tell you to flush it";

## 2021-04-28 DIAGNOSIS — R4182 Altered mental status, unspecified: Secondary | ICD-10-CM | POA: Diagnosis not present

## 2021-04-28 LAB — PREGNANCY, URINE: Preg Test, Ur: NEGATIVE

## 2021-04-28 LAB — TSH: TSH: 0.775 u[IU]/mL (ref 0.350–4.500)

## 2021-04-28 LAB — RESP PANEL BY RT-PCR (FLU A&B, COVID) ARPGX2
Influenza A by PCR: NEGATIVE
Influenza B by PCR: NEGATIVE
SARS Coronavirus 2 by RT PCR: NEGATIVE

## 2021-04-28 NOTE — ED Notes (Signed)
Patients mom updated about patient, contact information up to date for provider to notify.

## 2021-04-28 NOTE — BH Assessment (Signed)
Patient has been accepted to [facility] on today 04/28/21.   Accepting physician is Dr. Randa Evens.  Call report to [phone].  Representative was [employer].   ER Staff is aware of it:  Lattie Haw, ER Secretary  Dr. Archie Balboa, ER MD  Brandy,Patient's Nurse     Patient's Family/Support System Aayla Snead- mom [phone]) has been updated as well.    [facility] ** [place], [place] [address] [phone]

## 2021-04-28 NOTE — Telephone Encounter (Signed)
She never replied.

## 2021-04-28 NOTE — BH Assessment (Signed)
[facility] doctor is requesting a CT scan (head), pregnancy test and TSH lab, all of which have been ordered by patient's nurse. Awaiting results.

## 2021-04-28 NOTE — ED Notes (Signed)
This RN called dr Archie Balboa to inform him of the pt's refusal to go to [facility]. Dr Archie Balboa ok that pt doesn't want to go due to pt either being transferred to this facility or being discharged home. 2 messages left with the pt's mother's phone to make her aware of the pt's refusal to be transferred

## 2021-04-28 NOTE — BH Assessment (Addendum)
Writer faxed lab results to [facility] [phone].

## 2021-04-28 NOTE — Discharge Instructions (Addendum)
Going straight to [facility]

## 2021-04-28 NOTE — ED Notes (Signed)
Pt requesting that the nurse get her BM out of the toilet to check for roundworms; informed pt the doctor has to place an order for that

## 2021-04-28 NOTE — BH Assessment (Signed)
Referral information for Psychiatric Hospitalization faxed to:   [facility] ([phone]-or- [phone]),   [facility] ([phone]),  [facility] ([phone]),   [facility] ([phone] -or- [phone]),   [facility] ([phone])  [facility] ([phone]).  [facility] ([phone])  

## 2021-04-28 NOTE — ED Provider Notes (Signed)
Patient had been set up to be transferred to [facility]  Patient however refused transport once arrives came.  Patient is voluntary.  Patient will be discharged.   Nance Pear, MD 04/28/21 [phone]

## 2021-04-28 NOTE — ED Notes (Signed)
Vital signs taken and patient informed that she will be transported to [facility] for further treatment. Patient did transfer to wheelchair and Conservation officer, nature RN rolled patient out to safe transport.

## 2021-04-28 NOTE — BH Assessment (Signed)
Patient under review at [facility].

## 2021-04-28 NOTE — ED Provider Notes (Signed)
Emergency Medicine Observation Re-evaluation Note  Karen [name] is a 45 y.o. female, seen on rounds today.  Pt initially presented to the ED for complaints of No chief complaint on file. Currently, the patient is resting, voices no medical complaints.  Physical Exam  BP 138/83   Pulse 89   Temp 98.7 F (37.1 C) (Axillary)   Resp (!) 28   SpO2 98%  Physical Exam General: Resting in no acute distress Cardiac: No cyanosis Lungs: Equal rise and fall Psych: Not agitated  ED Course / MDM  EKG:   I have reviewed the labs performed to date as well as medications administered while in observation.  Recent changes in the last 24 hours include no events overnight.  Plan  Current plan is for psychiatric disposition.  Karen [name] is not under involuntary commitment.     Paulette Blanch, MD 04/28/21 [phone]

## 2021-04-28 NOTE — Telephone Encounter (Signed)
Thank you.  I don't think that we are going to be able to find a neuro practice for her.

## 2021-04-28 NOTE — ED Notes (Signed)
Patient transferred to [facility], report given to [facility] [place], Therapist, sports. Patient dressed out (only clothing on was a pink colored shirt) by this RN and [place], EDT. Belongings given to mother. Mother notified she could only have visitors during visiting hours. Mother is aware of when visiting hours are and plan for possible transfer to psych facility. Patient continues to have bizarre behavior, stating her stool needs to be cultured for ringworm.

## 2021-04-28 NOTE — ED Notes (Signed)
Patient provided with meal tray.

## 2021-04-28 NOTE — BH Assessment (Signed)
Comprehensive Clinical Assessment (CCA) Note  04/28/2021 Karen [name] [id number] Recommendations for Services/Supports/Treatments: Psych NP Karen [name]. determined pt. meets psychiatric inpatient criteria. Facilities will be contacted for placement.    45 year old, English speaking, white female with a history of TBI and multiple medical problems. Pt presented to ED for Catatonia. Per triage note: Pt arrives to ED from home via [facility]. Pt is contracted and is only responsive to painful stimuli. Mother states that pt. was found in her room in this state and her last normal was 10 pm last night. Pt noted to be under physical distress and groaned in pain throughout the assessment. Pt seemingly experiencing paranoid delusions as she is suspicious about whether or not her medications are a placebo. Pt had slurred, yet coherent speech. Pt presented with an anxious mood and a congruent affect. Pt had a disheveled appearance and maintained poor eye contact. Pt presented with relevant thought processes and restless psychomotor activity. The pt. was preoccupied with requesting pain medications. Pt was cooperative and knowledgeable about her meds and diagnoses. The patient denied current SI, HI or AV/H.   Collateral Karen [name] (Mother) [phone] Pt's mother reported that the pt. has been catatonic for hours and becomes unresponsive/rigid when in this state. Mother reported that the pt. has delusions and calls out for her brother who is not present. Mother reported that the pt. has 3 TBI incidents that have impacted the pt.'s physical and mental functioning. Mother explained that the pt. is normally compliant, however the pt. has become oppositional and difficult to manage in the home.  Chief Complaint: No chief complaint on file.  Visit Diagnosis: Diagnosis deferred    CCA Screening, Triage and Referral (STR)  Patient Reported Information How did you hear about [place]? Family/Friend  Referral name: No  data recorded Referral phone number: No data recorded  Whom do you see for routine medical problems? No data recorded Practice/Facility Name: No data recorded Practice/Facility Phone Number: No data recorded Name of Contact: No data recorded Contact Number: No data recorded Contact Fax Number: No data recorded Prescriber Name: No data recorded Prescriber Address (if known): No data recorded  What Is the Reason for Your Visit/Call Today? Catatonia  How Long Has This Been Causing You Problems? 1 wk - 1 month  What Do You Feel Would Help You the Most Today? Treatment for Depression or other mood problem   Have You Recently Been in Any Inpatient Treatment (Hospital/Detox/Crisis Center/28-Day Program)? No data recorded Name/Location of Program/Hospital:No data recorded How Long Were You There? No data recorded When Were You Discharged? No data recorded  Have You Ever Received Services From [facility] Before? No data recorded Who Do You See at [facility]? No data recorded  Have You Recently Had Any Thoughts About Hurting Yourself? No  Are You Planning to Commit Suicide/Harm Yourself At This time? No   Have you Recently Had Thoughts About [place]? No  Explanation: No data recorded  Have You Used Any Alcohol or Drugs in the Past 24 Hours? No  How Long Ago Did You Use Drugs or Alcohol? No data recorded What Did You Use and How Much? No data recorded  Do You Currently Have a Therapist/Psychiatrist? No  Name of Therapist/Psychiatrist: No data recorded  Have You Been Recently Discharged From Any Office Practice or Programs? No  Explanation of Discharge From Practice/Program: No data recorded    CCA Screening Triage Referral Assessment Type of Contact: Face-to-Face  Is this  Initial or Reassessment? No data recorded Date Telepsych consult ordered in [facility]:  No data recorded Time Telepsych consult ordered in [facility]:  No data recorded  Patient Reported Information  Reviewed? No data recorded Patient Left Without Being Seen? No data recorded Reason for Not Completing Assessment: No data recorded  Collateral Involvement: Mother Karen [name] [phone]   Does Patient Have a Court Appointed Legal Guardian? No data recorded Name and Contact of Legal Guardian: No data recorded If Minor and Not Living with Parent(s), Who has Custody? No data recorded Is CPS involved or ever been involved? Never  Is APS involved or ever been involved? Never   Patient Determined To Be At Risk for Harm To Self or Others Based on Review of Patient Reported Information or Presenting Complaint? No  Method: No data recorded Availability of Means: No data recorded Intent: No data recorded Notification Required: No data recorded Additional Information for Danger to Others Potential: No data recorded Additional Comments for Danger to Others Potential: No data recorded Are There Guns or Other Weapons in Your Home? No data recorded Types of Guns/Weapons: No data recorded Are These Weapons Safely Secured?                            No data recorded Who Could Verify You Are Able To Have These Secured: No data recorded Do You Have any Outstanding Charges, Pending Court Dates, Parole/Probation? No data recorded Contacted To Inform of Risk of Harm To Self or Others: No data recorded  [facility] of Assessment: [facility]   Does Patient Present under Involuntary Commitment? No  IVC Papers Initial File Date: No data recorded  [place] of Residence: [place]   Patient Currently Receiving the Following Services: No data recorded  Determination of Need: Emergent (2 hours)   Options For Referral: Inpatient Hospitalization     CCA Biopsychosocial Intake/Chief Complaint:  No data recorded Current Symptoms/Problems: No data recorded  Patient Reported Schizophrenia/Schizoaffective Diagnosis in Past: No   Strengths: Pt is communicative  Preferences: No data  recorded Abilities: No data recorded  Type of Services Patient Feels are Needed: No data recorded  Initial Clinical Notes/Concerns: No data recorded  Mental Health Symptoms Depression:   None   Duration of Depressive symptoms: No data recorded  Mania:   None   Anxiety:    None   Psychosis:   Grossly disorganized or catatonic behavior   Duration of Psychotic symptoms:  Less than six months   Trauma:   None   Obsessions:   None   Compulsions:   None   Inattention:   None   Hyperactivity/Impulsivity:   None   Oppositional/Defiant Behaviors:   None   Emotional Irregularity:   None   Other Mood/Personality Symptoms:  No data recorded   Mental Status Exam Appearance and self-care  Stature:   Small   Weight:   Average weight   Clothing:   Disheveled   Grooming:   Neglected   Cosmetic use:   None   Posture/gait:   Rigid   Motor activity:   Restless   Sensorium  Attention:   Distractible   Concentration:   Variable   Orientation:   X5   Recall/memory:   Normal   Affect and Mood  Affect:   Anxious   Mood:   Irritable; Anxious   Relating  Eye contact:   None   Facial expression:   Responsive   Attitude toward examiner:  Cooperative   Thought and Language  Speech flow: No data recorded  Thought content:   Appropriate to Mood and Circumstances   Preoccupation:   None   Hallucinations:   None   Organization:  No data recorded  [employer] of Knowledge:   Good   Intelligence:   Above Average   Abstraction:   Normal   Judgement:   Impaired   Reality Testing:   Adequate   Insight:   Present   Decision Making:   Normal   Social Functioning  Social Maturity:   Isolates   Social Judgement:   Heedless   Stress  Stressors:   Illness   Coping Ability:   Advice worker Deficits:   Self-care   Supports:   Family     Religion:    Leisure/Recreation: Leisure /  Recreation Do You Have Hobbies?: No  Exercise/Diet: Exercise/Diet Do You Exercise?: No Have You Gained or Lost A Significant Amount of Weight in the Past Six Months?: Yes-Gained Do You Follow a Special Diet?: No Do You Have Any Trouble Sleeping?: Yes Explanation of Sleeping Difficulties: sleep apnea   CCA Employment/Education Employment/Work Situation: Employment / Work Situation Employment Situation: On disability Why is Patient on Disability: Physical illness Patient's Job has Been Impacted by Current Illness: No Has Patient ever Been in the [employer]?: No  Education: Education Is Patient Currently Attending School?: No Did You Nutritional therapist?: Yes What Type of College Degree Do you Have?: Music k-12 instruction trumpet Did You Have An Individualized Education Program (IIEP): No Did You Have Any Difficulty At School?: No Patient's Education Has Been Impacted by Current Illness: Yes How Does Current Illness Impact Education?: Due to multiple TBI's pt's cognitive abilities decreased   CCA Family/Childhood History Family and Relationship History: Family history Marital status: Single Does patient have children?: No  Childhood History:  Childhood History By whom was/is the patient raised?: Mother Did patient suffer any verbal/emotional/physical/sexual abuse as a child?: No Did patient suffer from severe childhood neglect?: No Has patient ever been sexually abused/assaulted/raped as an adolescent or adult?: No Was the patient ever a victim of a crime or a disaster?: No Witnessed domestic violence?: No Has patient been affected by domestic violence as an adult?: No  Child/Adolescent Assessment:     CCA Substance Use Alcohol/Drug Use: Alcohol / Drug Use Pain Medications: See MAR Prescriptions: See MAR Over the Counter: See MAR History of alcohol / drug use?: No history of alcohol / drug abuse Longest period of sobriety (when/how long): Unknown                          ASAM's:  Six Dimensions of Multidimensional Assessment  Dimension 1:  Acute Intoxication and/or Withdrawal Potential:      Dimension 2:  Biomedical Conditions and Complications:      Dimension 3:  Emotional, Behavioral, or Cognitive Conditions and Complications:     Dimension 4:  Readiness to Change:     Dimension 5:  Relapse, Continued use, or Continued Problem Potential:     Dimension 6:  Recovery/Living Environment:     ASAM Severity Score:    ASAM Recommended Level of Treatment:     Substance use Disorder (SUD)    Recommendations for Services/Supports/Treatments:    DSM5 Diagnoses: Patient Active Problem List   Diagnosis Date Noted   Acute metabolic encephalopathy [id number]   Asthma 04/22/2021   Paranoid ([place]) 04/22/2021   Psychosis (  [place]) 04/22/2021   UTI (urinary tract infection) 04/22/2021   Sepsis ([place]) 04/22/2021   Urinary retention 04/22/2021   Chronic tension type headache 06/04/2020   Breast lump on left side at 1 o'clock position 10/09/2019   Fibrocystic breast changes, bilateral 10/09/2019   Family history of colon cancer    Rectal polyp    Heartburn    Excessive somnolence disorder 06/28/2017   Seizures ([place]) 09/12/2016   Difficulty hearing 08/31/2015   Back pain, chronic 08/31/2015   Rheumatoid arthritis ([place]) 08/31/2015   Alopecia 03/10/2015   Anemia 03/10/2015   Arthritis 03/10/2015   Allergic asthma, mild persistent, uncomplicated [id number]   Chronic nausea 03/10/2015   DDD (degenerative disc disease), lumbar 03/10/2015   Depression with anxiety 03/10/2015   Personal history of MRSA (methicillin resistant Staphylococcus aureus) 03/10/2015   Insomnia 03/10/2015   Mitral valve prolapse 03/10/2015   Paresthesias/numbness 03/10/2015   Allergic rhinitis, seasonal 03/10/2015   Seizure disorder ([place]) 03/10/2015   Mixed sleep apnea, Central predominant 03/10/2015   Migraine without aura 01/27/2014   Fibromyalgia 01/27/2014    Transient alteration of awareness 01/07/2014   Tics of organic origin 01/07/2014   Backache, unspecified 01/07/2014   Ehlers-Danlos syndrome 01/07/2014    Patient Centered Plan:   Kathi Ludwig, LCAS

## 2021-04-28 NOTE — Consult Note (Signed)
[facility] Face-to-Face Psychiatry Consult   Reason for Consult: No chief complaint on file. Referring Physician: Dr. Quentin Cornwall Patient Identification: Karen [name] MRN:  [id number] Principal Diagnosis: <principal problem not specified> Diagnosis:  Active Problems:   Tics of organic origin   Fibromyalgia   DDD (degenerative disc disease), lumbar   Depression with anxiety   Insomnia   Back pain, chronic   Excessive somnolence disorder   Chronic tension type headache   Acute metabolic encephalopathy   Total Time spent with patient: 1 hour  Subjective: "I need some pain medicine." Karen [name] is a 45 y.o. female patient presented to  [facility] ED  via EMS voluntary. The patient is yelling, stating her fentanyl (Duragesic patch) is the "fake patch" because it is not helping her pain. The patient's mom is at her side, saying something is wrong with her daughter. Mom shared, "the patient can't go home like that. "There is something wrong with her." The patient was seen face-to-face by this provider; the chart was reviewed and consulted with Dr. Quentin Cornwall on 04/27/2021 due to the patient's care. It was discussed with Dr. [name] that the patient's mom disagreed with discharging the patient and having her psychiatric care be managed at home. It was discussed with both providers that the patient does meet the criteria to be admitted to the psychiatric inpatient unit. The patient will be referred to other psychiatric inpatient facilities. On evaluation, the patient is alert and oriented x 4, anxious, irritated but able to answer questions, and mood-congruent with affect. The patient does not appear to be responding to internal stimuli but responding to external stimuli. Neither is the patient presenting with any delusional thinking. The patient's mom admitted that the patient is experiencing auditory and visual hallucinations. The patient denies any suicidal, homicidal, or self-harm ideations. The patient  is not presenting with any psychotic or paranoid behaviors. During an encounter with the patient, she could answer questions appropriately.  HPI: Per Dr. Quentin Cornwall, Karen [name] is a 45 y.o. female with the below listed past medical history presents to the ER for evaluation of altered mental status last being seen normal yesterday at 10 PM.  Found unresponsive and rigid.  Had similar presentation with recent admission with extensive work-up without clear etiology.  Has had extensive neuro work-up for seizure-like activity was found to be nonepileptic pseudoseizure.  Has been having some adjustments to her psychiatric medications has had multiple admissions for psychiatric treatment.  No report of any overdose.  She arrives not following commands will blank.  Does appear rigid.  Past Psychiatric History:   Risk to Self:   Risk to Others:   Prior Inpatient Therapy:   Prior Outpatient Therapy:    Past Medical History:  Past Medical History:  Diagnosis Date   Ehlers-Danlos syndrome    Fibromyalgia    Headache    Sleep apnea    Tics of organic origin    Transient alteration of awareness     Past Surgical History:  Procedure Laterality Date   COLONOSCOPY WITH PROPOFOL N/A 02/13/2018   Procedure: COLONOSCOPY WITH PROPOFOL;  Surgeon: [name], MD;  Location: [facility] ENDOSCOPY;  Service: Endoscopy;  Laterality: N/A;   DILATION AND CURETTAGE OF UTERUS  2009   ESOPHAGOGASTRODUODENOSCOPY (EGD) WITH PROPOFOL  02/13/2018   Procedure: ESOPHAGOGASTRODUODENOSCOPY (EGD) WITH PROPOFOL;  Surgeon: [name], MD;  Location: [facility] ENDOSCOPY;  Service: Endoscopy;;   EYE SURGERY Left 1990   3 Surgeries on left eye to correct crossed eye  LAPAROSCOPY Left 2003   Fallopian Tube   MANDIBLE SURGERY  1998   OTHER SURGICAL HISTORY Right 1987   3 Surgeries to repair broken arm   OTHER SURGICAL HISTORY Left    3 surgeries on her left thigh as an infant   OTHER SURGICAL HISTORY  1998   Jaw surgery   Right  arm surgery  1987   x 3 due to fracture   TOENAIL EXCISION Bilateral 06/13/2019   Procedure: BILATERAL SECOND TOE PARTIAL NAIL ABLATION;  Surgeon: Erle Crocker, MD;  Location: [facility];  Service: Orthopedics;  Laterality: Bilateral;  SURGERY REQUEST TIME 1 HOUR   TONSILLECTOMY  12/13/2002   Family History:  Family History  Problem Relation Age of Onset   Depression Mother    Stroke Mother    Fibromyalgia Mother    Osteoarthritis Mother    Asthma Brother    Depression Brother    Migraines Brother    Asperger's syndrome Brother    Other Brother        Ehlers-Danlos Syndrome   Cancer Maternal Aunt    Asperger's syndrome Maternal Aunt    Breast cancer Maternal Aunt    Heart disease Paternal Aunt    Heart disease Paternal Uncle    Cervical cancer Maternal Grandmother    Osteoporosis Maternal Grandmother        Died at 63   Osteoarthritis Maternal Grandmother    Colon cancer Maternal Grandfather    Asthma Maternal Grandfather    Asperger's syndrome Maternal Grandfather    Emphysema Maternal Grandfather        Died at 2   Prostate cancer Maternal Grandfather    Heart attack Paternal Grandfather        Died at 43   Asthma Paternal Grandfather    Tuberculosis Paternal Grandfather    Cancer Cousin    Asperger's syndrome Cousin    Asperger's syndrome Other    Heart Problems Paternal Grandmother        Died at 68   Ehlers-Danlos syndrome Father    Ehlers-Danlos syndrome Brother    Family Psychiatric  History:  Social History:  Social History   Substance and Sexual Activity  Alcohol Use No   Alcohol/week: 0.0 standard drinks     Social History   Substance and Sexual Activity  Drug Use No    Social History   Socioeconomic History   Marital status: Single    Spouse name: Not on file   Number of children: Not on file   Years of education: Not on file   Highest education level: Not on file  Occupational History   Not on file  Tobacco Use    Smoking status: Never   Smokeless tobacco: Never  Vaping Use   Vaping Use: Never used  Substance and Sexual Activity   Alcohol use: No    Alcohol/week: 0.0 standard drinks   Drug use: No   Sexual activity: Never  Other Topics Concern   Not on file  Social History Narrative   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    She lives with her mother.    She enjoys reading, watching TV, writing, and painting.    Social Determinants of Health   Financial Resource Strain: Not on file  Food Insecurity: Not on file  Transportation Needs: Not on file  Physical Activity: Not on file  Stress: Not on file  Social Connections: Not on file   Additional Social History:  Allergies:   Allergies  Allergen Reactions   Augmentin  [Amoxicillin-Pot Clavulanate] Diarrhea   Chocolate Flavor     Other reaction(s): Other (See Comments) Wheezing, acne   Demerol [Meperidine] Other (See Comments)    Slow to wake up when this drug is given.    Keflex [Cephalexin] Nausea And Vomiting   Morphine And Related Nausea And Vomiting   Tape Dermatitis    Must use paper tape only   Toradol [Ketorolac Tromethamine] Nausea And Vomiting   Chocolate     GI distress   Nsaids     patient develops ulcers   Strawberry Extract     GI distress    Labs:  Results for orders placed or performed during the hospital encounter of 04/27/21 (from the past 48 hour(s))  CBC with Differential     Status: Abnormal   Collection Time: 04/27/21  4:04 PM  Result Value Ref Range   WBC 9.6 4.0 - 10.5 K/uL   RBC 5.37 (H) 3.87 - 5.11 MIL/uL   Hemoglobin 15.9 (H) 12.0 - 15.0 g/dL   HCT 45.5 36.0 - 46.0 %   MCV 84.7 80.0 - 100.0 fL   MCH 29.6 26.0 - 34.0 pg   MCHC 34.9 30.0 - 36.0 g/dL   RDW 13.7 11.5 - 15.5 %   Platelets 373 150 - 400 K/uL   nRBC 0.0 0.0 - 0.2 %   Neutrophils Relative % 77 %   Neutro Abs 7.4 1.7 - 7.7 K/uL   Lymphocytes Relative 18 %   Lymphs Abs 1.7 0.7 - 4.0 K/uL   Monocytes Relative 4 %   Monocytes  Absolute 0.3 0.1 - 1.0 K/uL   Eosinophils Relative 0 %   Eosinophils Absolute 0.0 0.0 - 0.5 K/uL   Basophils Relative 1 %   Basophils Absolute 0.1 0.0 - 0.1 K/uL   Immature Granulocytes 0 %   Abs Immature Granulocytes 0.03 0.00 - 0.07 K/uL    Comment: Performed at [facility], [address] [place], [place] [address]  Comprehensive metabolic panel     Status: Abnormal   Collection Time: 04/27/21  4:04 PM  Result Value Ref Range   Sodium 141 135 - 145 mmol/L   Potassium 3.8 3.5 - 5.1 mmol/L   Chloride 105 98 - 111 mmol/L   CO2 21 (L) 22 - 32 mmol/L   Glucose, Bld 106 (H) 70 - 99 mg/dL    Comment: Glucose reference range applies only to samples taken after fasting for at least 8 hours.   BUN 14 6 - 20 mg/dL   Creatinine, Ser 0.71 0.44 - 1.00 mg/dL   Calcium 9.7 8.9 - 10.3 mg/dL   Total Protein 7.9 6.5 - 8.1 g/dL   Albumin 4.3 3.5 - 5.0 g/dL   AST 15 15 - 41 U/L   ALT 12 0 - 44 U/L   Alkaline Phosphatase 78 38 - 126 U/L   Total Bilirubin 0.9 0.3 - 1.2 mg/dL   GFR, Estimated >60 >60 mL/min    Comment: (NOTE) Calculated using the CKD-EPI Creatinine Equation (2021)    Anion gap 15 5 - 15    Comment: Performed at [facility], [address] [place], [place] [address]  CK     Status: None   Collection Time: 04/27/21  4:04 PM  Result Value Ref Range   Total CK 40 38 - 234 U/L    Comment: Performed at [facility], [address], [place],  [address]  Urine Drug Screen, Qualitative ([facility] only)     Status: Abnormal   Collection Time: 04/27/21  4:06 PM  Result Value Ref Range   Tricyclic, Ur Screen POSITIVE (A) NONE DETECTED   Amphetamines, Ur Screen NONE DETECTED NONE DETECTED   MDMA (Ecstasy)Ur Screen NONE DETECTED NONE DETECTED   Cocaine Metabolite,Ur [place] NONE DETECTED NONE DETECTED   Opiate, Ur Screen NONE DETECTED NONE DETECTED   Phencyclidine (PCP) Ur S NONE DETECTED NONE DETECTED   Cannabinoid 50 Ng, Ur  NONE DETECTED NONE DETECTED    Barbiturates, Ur Screen NONE DETECTED NONE DETECTED   Benzodiazepine, Ur Scrn NONE DETECTED NONE DETECTED   Methadone Scn, Ur NONE DETECTED NONE DETECTED    Comment: (NOTE) Tricyclics + metabolites, urine    Cutoff 1000 ng/mL Amphetamines + metabolites, urine  Cutoff 1000 ng/mL MDMA (Ecstasy), urine              Cutoff 500 ng/mL Cocaine Metabolite, urine          Cutoff 300 ng/mL Opiate + metabolites, urine        Cutoff 300 ng/mL Phencyclidine (PCP), urine         Cutoff 25 ng/mL Cannabinoid, urine                 Cutoff 50 ng/mL Barbiturates + metabolites, urine  Cutoff 200 ng/mL Benzodiazepine, urine              Cutoff 200 ng/mL Methadone, urine                   Cutoff 300 ng/mL  The urine drug screen provides only a preliminary, unconfirmed analytical test result and should not be used for non-medical purposes. Clinical consideration and professional judgment should be applied to any positive drug screen result due to possible interfering substances. A more specific alternate chemical method must be used in order to obtain a confirmed analytical result. Gas chromatography / mass spectrometry (GC/MS) is the preferred confirm atory method. Performed at [facility], [address] [place], [place] [address]   Urinalysis, Complete w Microscopic     Status: Abnormal   Collection Time: 04/27/21  4:06 PM  Result Value Ref Range   Color, Urine YELLOW (A) YELLOW   APPearance HAZY (A) CLEAR   Specific Gravity, Urine 1.013 1.005 - 1.030   pH 7.0 5.0 - 8.0   Glucose, UA NEGATIVE NEGATIVE mg/dL   Hgb urine dipstick NEGATIVE NEGATIVE   Bilirubin Urine NEGATIVE NEGATIVE   Ketones, ur 20 (A) NEGATIVE mg/dL   Protein, ur NEGATIVE NEGATIVE mg/dL   Nitrite NEGATIVE NEGATIVE   Leukocytes,Ua NEGATIVE NEGATIVE   RBC / HPF 0-5 0 - 5 RBC/hpf   WBC, UA 0-5 0 - 5 WBC/hpf   Bacteria, UA NONE SEEN NONE SEEN   Squamous Epithelial / LPF 0-5 0 - 5   Mucus PRESENT    Amorphous  Crystal PRESENT     Comment: Performed at [facility], [address], [place],  [address]  Resp Panel by RT-PCR (Flu A&B, Covid) Nasopharyngeal Swab     Status: None   Collection Time: 04/27/21 11:35 PM   Specimen: Nasopharyngeal Swab; Nasopharyngeal(NP) swabs in vial transport medium  Result Value Ref Range   SARS Coronavirus 2 by RT PCR NEGATIVE NEGATIVE    Comment: (NOTE) SARS-CoV-2 target nucleic acids are NOT DETECTED.  The SARS-CoV-2 RNA is generally detectable in upper respiratory specimens during the acute phase of infection. The lowest concentration  of SARS-CoV-2 viral copies this assay can detect is 138 copies/mL. A negative result does not preclude SARS-Cov-2 infection and should not be used as the sole basis for treatment or other patient management decisions. A negative result may occur with  improper specimen collection/handling, submission of specimen other than nasopharyngeal swab, presence of viral mutation(s) within the areas targeted by this assay, and inadequate number of viral copies(<138 copies/mL). A negative result must be combined with clinical observations, patient history, and epidemiological information. The expected result is Negative.  Fact Sheet for Patients:  EntrepreneurPulse.com.au  Fact Sheet for Healthcare Providers:  IncredibleEmployment.be  This test is no t yet approved or cleared by the [place] FDA and  has been authorized for detection and/or diagnosis of SARS-CoV-2 by FDA under an Emergency Use Authorization (EUA). This EUA will remain  in effect (meaning this test can be used) for the duration of the COVID-19 declaration under Section 564(b)(1) of the Act, 21 U.S.C.section 360bbb-3(b)(1), unless the authorization is terminated  or revoked sooner.       Influenza A by PCR NEGATIVE NEGATIVE   Influenza B by PCR NEGATIVE NEGATIVE    Comment: (NOTE) The Xpert Xpress  SARS-CoV-2/FLU/RSV plus assay is intended as an aid in the diagnosis of influenza from Nasopharyngeal swab specimens and should not be used as a sole basis for treatment. Nasal washings and aspirates are unacceptable for Xpert Xpress SARS-CoV-2/FLU/RSV testing.  Fact Sheet for Patients: EntrepreneurPulse.com.au  Fact Sheet for Healthcare Providers: IncredibleEmployment.be  This test is not yet approved or cleared by the [place] FDA and has been authorized for detection and/or diagnosis of SARS-CoV-2 by FDA under an Emergency Use Authorization (EUA). This EUA will remain in effect (meaning this test can be used) for the duration of the COVID-19 declaration under Section 564(b)(1) of the Act, 21 U.S.C. section 360bbb-3(b)(1), unless the authorization is terminated or revoked.  Performed at [facility], [address], [place], [place] [address]     Current Facility-Administered Medications  Medication Dose Route Frequency Provider Last Rate Last Admin   busPIRone (BUSPAR) tablet 10 mg  10 mg Oral BID Merlyn Lot, MD       busPIRone (BUSPAR) tablet 15 mg  15 mg Oral BID Merlyn Lot, MD       cefdinir (OMNICEF) capsule 300 mg  300 mg Oral Q12H Merlyn Lot, MD       cyclobenzaprine (FLEXERIL) tablet 5 mg  5 mg Oral TID PRN Merlyn Lot, MD       fentaNYL (DURAGESIC) 100 MCG/HR 1 patch  1 patch Transdermal Q72H Merlyn Lot, MD   1 patch at 04/27/21 2005   haloperidol (HALDOL) tablet 1 mg  1 mg Oral BID Merlyn Lot, MD       mometasone-formoterol [facility]) 200-5 MCG/ACT inhaler 2 puff  2 puff Inhalation BID Merlyn Lot, MD       pantoprazole (PROTONIX) EC tablet 40 mg  40 mg Oral Daily Merlyn Lot, MD       QUEtiapine (SEROQUEL) tablet 200 mg  200 mg Oral QHS Merlyn Lot, MD       Current Outpatient Medications  Medication Sig Dispense Refill   busPIRone (BUSPAR) 10 MG tablet Take 1  tablet (10 mg total) by mouth 2 (two) times daily. Take along with '15mg'$  tablet twice a day 180 tablet 4   busPIRone (BUSPAR) 15 MG tablet TAKE 1 TABLET BY MOUTH 2 TIMES DAILY. 180 tablet 2   celecoxib (CELEBREX) 200 MG capsule  TAKE 1 CAPSULE BY MOUTH TWICE A DAY 60 capsule 5   cyclobenzaprine (FLEXERIL) 5 MG tablet Take 1 tablet (5 mg total) by mouth 3 (three) times daily as needed for muscle spasms. Do not take within 6 hours of taking tizandine 30 tablet 2   diclofenac Sodium (VOLTAREN) 1 % GEL APPLY AS NEEDED 3 TIMES A DAY 100 g 0   DULoxetine (CYMBALTA) 60 MG capsule TAKE 2 CAPSULES BY MOUTH EVERY DAY 180 capsule 3   fluticasone (FLOVENT HFA) 110 MCG/ACT inhaler Inhale 23 puffs into the lungs 2 (two) times daily.     gabapentin (NEURONTIN) 600 MG tablet TAKE 2 TABLETS IN THE MORNING AND 3 TABLETS AT BEDTIME 155 tablet 5   haloperidol (HALDOL) 1 MG tablet Take 1 mg by mouth 2 (two) times daily. Patient states she should be and has been on 5 mg twice daily.     pregabalin (LYRICA) 300 MG capsule Take 1 capsule (300 mg total) by mouth 2 (two) times daily. 180 capsule 1   QUEtiapine (SEROQUEL) 200 MG tablet TAKE 1 TABLET BY MOUTH AT BEDTIME. 90 tablet 5   tamsulosin (FLOMAX) 0.4 MG CAPS capsule Take 1 capsule (0.4 mg total) by mouth daily. 90 capsule 3   tiZANidine (ZANAFLEX) 4 MG tablet TAKE 0.5-1 TABLETS (2-4 MG TOTAL) BY MOUTH EVERY 8 (EIGHT) HOURS AS NEEDED FOR MUSCLE SPASMS. 270 tablet 1   albuterol (VENTOLIN HFA) 108 (90 Base) MCG/ACT inhaler Inhale 2 puffs into the lungs every 6 (six) hours as needed. 8 g 12   azelastine (OPTIVAR) 0.05 % ophthalmic solution Apply to eye as directed. 1 drop in each eye prn     fentaNYL (DURAGESIC) 100 MCG/HR Place 1 patch onto the skin every 3 (three) days. 10 patch 0   fexofenadine (ALLEGRA) 180 MG tablet Take 180 mg by mouth daily.     fluticasone (FLONASE) 50 MCG/ACT nasal spray SPRAY 2 SPRAYS INTO EACH NOSTRIL EVERY DAY (Patient not taking: Reported on  04/27/2021) 16 g 2   Fluticasone-Salmeterol (ADVAIR) 250-50 MCG/DOSE AEPB Inhale 1 puff into the lungs every 12 (twelve) hours. Rinse mouth after use (Patient not taking: Reported on 04/27/2021) 60 each 11   omeprazole (PRILOSEC) 40 MG capsule TAKE 1 CAPSULE (40 MG TOTAL) BY MOUTH DAILY. 30 capsule 1   ondansetron (ZOFRAN-ODT) 4 MG disintegrating tablet DISSOLVE 1 TABLET IN MOUTH EVERY 8 HOURS FOR NAUSEA AND VOMITING 30 tablet 5    Musculoskeletal: Strength & Muscle Tone: within normal limits Gait & Station: normal Patient leans: N/A  Psychiatric Specialty Exam:  Presentation  General Appearance: Bizarre  Eye Contact:None  Speech:Clear and Coherent  Speech Volume:Normal  Handedness:Right   Mood and Affect  Mood:Depressed; Anxious; Irritable  Affect:Inappropriate; Full Range   Thought Process  Thought Processes:Coherent  Descriptions of Associations:Tangential  Orientation:Full (Time, Place and Person)  Thought Content:Logical; Paranoid Ideation  History of Schizophrenia/Schizoaffective disorder:No  Duration of Psychotic Symptoms:No data recorded Hallucinations:Hallucinations: None  Ideas of Reference:None  Suicidal Thoughts:Suicidal Thoughts: No  Homicidal Thoughts:Homicidal Thoughts: No   Sensorium  Memory:Immediate Good; Recent Good; Remote Good  Judgment:Impaired  Insight:Poor   Executive Functions  Concentration:Fair  Attention Span:Poor  Recall:Poor  Fund of Knowledge:Fair  Language:Fair   Psychomotor Activity  Psychomotor Activity:Psychomotor Activity: Decreased   Assets  Assets:Communication Skills; Desire for Improvement; Physical Health; Resilience; Social Support   Sleep  Sleep:Sleep: Poor   Physical Exam: Physical Exam Vitals and nursing note reviewed. Exam conducted with a chaperone present.  Constitutional:  Appearance: She is normal weight.  HENT:     Head: Normocephalic and atraumatic.     Nose: Nose normal.   Cardiovascular:     Rate and Rhythm: Normal rate.     Pulses: Normal pulses.  Pulmonary:     Effort: Pulmonary effort is normal.  Musculoskeletal:        General: Normal range of motion.     Cervical back: Rigidity present.  Lymphadenopathy:     Cervical: Cervical adenopathy present.  Neurological:     Mental Status: She is alert.     Motor: Weakness present.     Gait: Gait abnormal.  Psychiatric:        Attention and Perception: She is inattentive. She perceives auditory hallucinations.        Mood and Affect: Mood is anxious and depressed. Affect is flat, angry and inappropriate.        Speech: Speech is rapid and pressured and tangential.        Behavior: Behavior is uncooperative and agitated.        Thought Content: Thought content is paranoid and delusional.        Judgment: Judgment is impulsive and inappropriate.   Review of Systems  Psychiatric/Behavioral:  Positive for depression, hallucinations and memory loss. The patient is nervous/anxious and has insomnia.   Blood pressure 138/83, pulse 89, temperature 98.7 F (37.1 C), temperature source Axillary, resp. rate (!) 28, SpO2 98 %. There is no height or weight on file to calculate BMI.  Treatment Plan Summary: Medication management and Plan The patient is a safety risk to herself and requires psychiatric inpatient admission for stabilization and treatment.  Disposition: Recommend psychiatric Inpatient admission when medically cleared. Supportive therapy provided about ongoing stressors.  Caroline Sauger, NP 04/28/2021 1:28 AM

## 2021-04-28 NOTE — ED Notes (Signed)
Pt assisted to bsc, encouraged to call when finished to assist back in bed; pt verbalized understanding

## 2021-04-28 NOTE — ED Notes (Signed)
Patient refused to get into Safe Transport vehicle stating "I am my own legal guardian I sign my own medicare papers and I am not going".  Conservation officer, historic buildings educated patient of safe discharge plan and verbalized importance of compliance with treatment plan.  Patient continues to refuse plan to discharge to [facility]. Writer asked patient if she wants to go home? Patient states she does not want to go home and would like to stay at the ER.  Patient escorted back to ED lobby with all personal belongings. Wheelchair parked at front desk.  Mother called without answer.  Voice message left "Patient refused discharge plan to transfer to [facility] and she will need transportation home as soon as possible.

## 2021-04-28 NOTE — ED Notes (Signed)
[facility] called and notified of pt's refusal to be transported to their facility

## 2021-04-28 NOTE — ED Notes (Signed)
Patients [phone] would like update from psychiatry.

## 2021-04-29 ENCOUNTER — Emergency Department
Admission: EM | Admit: 2021-04-29 | Discharge: 2021-05-04 | Disposition: A | Discharge status: Home / Self Care | Payer: Medicare Other | Attending: Emergency Medicine | Admitting: Emergency Medicine

## 2021-04-29 ENCOUNTER — Other Ambulatory Visit: Payer: Self-pay

## 2021-04-29 ENCOUNTER — Encounter: Payer: Self-pay | Admitting: Emergency Medicine

## 2021-04-29 DIAGNOSIS — J45909 Unspecified asthma, uncomplicated: Secondary | ICD-10-CM | POA: Diagnosis not present

## 2021-04-29 DIAGNOSIS — F332 Major depressive disorder, recurrent severe without psychotic features: Secondary | ICD-10-CM | POA: Diagnosis not present

## 2021-04-29 DIAGNOSIS — G43009 Migraine without aura, not intractable, without status migrainosus: Secondary | ICD-10-CM | POA: Diagnosis present

## 2021-04-29 DIAGNOSIS — Z79899 Other long term (current) drug therapy: Secondary | ICD-10-CM | POA: Insufficient documentation

## 2021-04-29 DIAGNOSIS — Z7952 Long term (current) use of systemic steroids: Secondary | ICD-10-CM | POA: Insufficient documentation

## 2021-04-29 DIAGNOSIS — F22 Delusional disorders: Secondary | ICD-10-CM | POA: Insufficient documentation

## 2021-04-29 DIAGNOSIS — Y900 Blood alcohol level of less than 20 mg/100 ml: Secondary | ICD-10-CM | POA: Insufficient documentation

## 2021-04-29 DIAGNOSIS — G2569 Other tics of organic origin: Secondary | ICD-10-CM | POA: Diagnosis present

## 2021-04-29 DIAGNOSIS — Z20822 Contact with and (suspected) exposure to covid-19: Secondary | ICD-10-CM | POA: Diagnosis not present

## 2021-04-29 DIAGNOSIS — M797 Fibromyalgia: Secondary | ICD-10-CM | POA: Diagnosis present

## 2021-04-29 DIAGNOSIS — Z760 Encounter for issue of repeat prescription: Secondary | ICD-10-CM | POA: Insufficient documentation

## 2021-04-29 DIAGNOSIS — F29 Unspecified psychosis not due to a substance or known physiological condition: Secondary | ICD-10-CM | POA: Diagnosis not present

## 2021-04-29 DIAGNOSIS — F331 Major depressive disorder, recurrent, moderate: Secondary | ICD-10-CM | POA: Diagnosis not present

## 2021-04-29 DIAGNOSIS — R9431 Abnormal electrocardiogram [ECG] [EKG]: Secondary | ICD-10-CM | POA: Diagnosis not present

## 2021-04-29 DIAGNOSIS — Q796 Ehlers-Danlos syndrome, unspecified: Secondary | ICD-10-CM

## 2021-04-29 LAB — CBC WITH DIFFERENTIAL/PLATELET
Abs Immature Granulocytes: 0.05 10*3/uL (ref 0.00–0.07)
Basophils Absolute: 0.1 10*3/uL (ref 0.0–0.1)
Basophils Relative: 1 %
Eosinophils Absolute: 0.2 10*3/uL (ref 0.0–0.5)
Eosinophils Relative: 1 %
HCT: 44.4 % (ref 36.0–46.0)
Hemoglobin: 15.6 g/dL — ABNORMAL HIGH (ref 12.0–15.0)
Immature Granulocytes: 1 %
Lymphocytes Relative: 22 %
Lymphs Abs: 2.4 10*3/uL (ref 0.7–4.0)
MCH: 30.1 pg (ref 26.0–34.0)
MCHC: 35.1 g/dL (ref 30.0–36.0)
MCV: 85.5 fL (ref 80.0–100.0)
Monocytes Absolute: 0.7 10*3/uL (ref 0.1–1.0)
Monocytes Relative: 7 %
Neutro Abs: 7.4 10*3/uL (ref 1.7–7.7)
Neutrophils Relative %: 68 %
Platelets: 351 10*3/uL (ref 150–400)
RBC: 5.19 MIL/uL — ABNORMAL HIGH (ref 3.87–5.11)
RDW: 13.3 % (ref 11.5–15.5)
WBC: 10.8 10*3/uL — ABNORMAL HIGH (ref 4.0–10.5)
nRBC: 0 % (ref 0.0–0.2)

## 2021-04-29 LAB — COMPREHENSIVE METABOLIC PANEL
ALT: 12 U/L (ref 0–44)
AST: 14 U/L — ABNORMAL LOW (ref 15–41)
Albumin: 4.1 g/dL (ref 3.5–5.0)
Alkaline Phosphatase: 69 U/L (ref 38–126)
Anion gap: 10 (ref 5–15)
BUN: 14 mg/dL (ref 6–20)
CO2: 25 mmol/L (ref 22–32)
Calcium: 9.1 mg/dL (ref 8.9–10.3)
Chloride: 103 mmol/L (ref 98–111)
Creatinine, Ser: 0.76 mg/dL (ref 0.44–1.00)
GFR, Estimated: >60 mL/min (ref >60)
Glucose, Bld: 112 mg/dL — ABNORMAL HIGH (ref 70–99)
Potassium: 3.5 mmol/L (ref 3.5–5.1)
Sodium: 138 mmol/L (ref 135–145)
Total Bilirubin: 0.8 mg/dL (ref 0.3–1.2)
Total Protein: 7.4 g/dL (ref 6.5–8.1)

## 2021-04-29 LAB — RESP PANEL BY RT-PCR (FLU A&B, COVID) ARPGX2
Influenza A by PCR: NEGATIVE
Influenza B by PCR: NEGATIVE
SARS Coronavirus 2 by RT PCR: NEGATIVE

## 2021-04-29 LAB — ETHANOL: Alcohol, Ethyl (B): <10 mg/dL (ref <10)

## 2021-04-29 MED ORDER — PANTOPRAZOLE SODIUM 40 MG PO TBEC
40.0000 mg | DELAYED_RELEASE_TABLET | Freq: Every day | ORAL | Status: DC
  Administered 2021-04-29 – 2021-05-04 (×6): 40 mg via ORAL
  Filled 2021-04-29 (×6): qty 1

## 2021-04-29 MED ORDER — ALBUTEROL SULFATE HFA 108 (90 BASE) MCG/ACT IN AERS
2.0000 | INHALATION_SPRAY | Freq: Four times a day (QID) | RESPIRATORY_TRACT | Status: DC | PRN
  Filled 2021-04-29: qty 6.7

## 2021-04-29 MED ORDER — BUSPIRONE HCL 5 MG PO TABS
15.0000 mg | ORAL_TABLET | Freq: Once | ORAL | Status: DC
  Filled 2021-04-29 (×2): qty 3

## 2021-04-29 MED ORDER — TAMSULOSIN HCL 0.4 MG PO CAPS
0.4000 mg | ORAL_CAPSULE | Freq: Every day | ORAL | Status: DC
  Administered 2021-04-29 – 2021-05-04 (×6): 0.4 mg via ORAL
  Filled 2021-04-29 (×6): qty 1

## 2021-04-29 MED ORDER — QUETIAPINE FUMARATE 200 MG PO TABS
200.0000 mg | ORAL_TABLET | Freq: Every day | ORAL | Status: DC
  Administered 2021-04-29 – 2021-05-03 (×5): 200 mg via ORAL
  Filled 2021-04-29 (×5): qty 1

## 2021-04-29 MED ORDER — TIZANIDINE HCL 2 MG PO TABS
2.0000 mg | ORAL_TABLET | Freq: Three times a day (TID) | ORAL | Status: DC | PRN
  Administered 2021-04-29 – 2021-05-01 (×4): 4 mg via ORAL
  Filled 2021-04-29: qty 1
  Filled 2021-04-29 (×6): qty 2

## 2021-04-29 MED ORDER — HALOPERIDOL 1 MG PO TABS
1.0000 mg | ORAL_TABLET | Freq: Two times a day (BID) | ORAL | Status: DC
  Administered 2021-04-29 – 2021-05-04 (×10): 1 mg via ORAL
  Filled 2021-04-29 (×11): qty 1

## 2021-04-29 MED ORDER — FENTANYL 100 MCG/HR TD PT72
1.0000 | MEDICATED_PATCH | TRANSDERMAL | Status: DC
  Filled 2021-04-29 (×2): qty 1

## 2021-04-29 MED ORDER — GABAPENTIN 600 MG PO TABS: 600.0000 mg | ORAL_TABLET | Freq: Two times a day (BID) | ORAL | Status: DC

## 2021-04-29 MED ORDER — FLUTICASONE PROPIONATE HFA 110 MCG/ACT IN AERO: 2.0000 | INHALATION_SPRAY | Freq: Two times a day (BID) | RESPIRATORY_TRACT | Status: DC

## 2021-04-29 MED ORDER — BUSPIRONE HCL 5 MG PO TABS
15.0000 mg | ORAL_TABLET | Freq: Two times a day (BID) | ORAL | Status: DC
  Administered 2021-04-29 – 2021-04-30 (×4): 15 mg via ORAL
  Filled 2021-04-29 (×2): qty 3

## 2021-04-29 MED ORDER — HALOPERIDOL 1 MG PO TABS
1.0000 mg | ORAL_TABLET | Freq: Once | ORAL | Status: AC
  Administered 2021-04-29: 1 mg via ORAL
  Filled 2021-04-29: qty 2
  Filled 2021-04-29: qty 1

## 2021-04-29 MED ORDER — PREGABALIN 75 MG PO CAPS
300.0000 mg | ORAL_CAPSULE | Freq: Two times a day (BID) | ORAL | Status: DC
  Administered 2021-04-29 – 2021-05-04 (×11): 300 mg via ORAL
  Filled 2021-04-29 (×11): qty 4

## 2021-04-29 MED ORDER — CELECOXIB 200 MG PO CAPS
200.0000 mg | ORAL_CAPSULE | Freq: Two times a day (BID) | ORAL | Status: DC
  Administered 2021-04-29 – 2021-05-04 (×11): 200 mg via ORAL
  Filled 2021-04-29 (×12): qty 1

## 2021-04-29 MED ORDER — BUDESONIDE 0.25 MG/2ML IN SUSP
0.2500 mg | Freq: Two times a day (BID) | RESPIRATORY_TRACT | Status: DC
  Administered 2021-04-30 – 2021-05-03 (×4): 0.25 mg via RESPIRATORY_TRACT
  Filled 2021-04-29 (×6): qty 2

## 2021-04-29 MED ORDER — LORAZEPAM 1 MG PO TABS
1.0000 mg | ORAL_TABLET | Freq: Once | ORAL | Status: AC
  Administered 2021-04-29: 1 mg via ORAL
  Filled 2021-04-29 (×2): qty 1

## 2021-04-29 MED ORDER — DULOXETINE HCL 60 MG PO CPEP
120.0000 mg | ORAL_CAPSULE | Freq: Every day | ORAL | Status: DC
  Administered 2021-04-29 – 2021-05-04 (×6): 120 mg via ORAL
  Filled 2021-04-29 (×6): qty 2

## 2021-04-29 MED ORDER — GABAPENTIN 300 MG PO CAPS
600.0000 mg | ORAL_CAPSULE | Freq: Once | ORAL | Status: AC
  Administered 2021-04-29: 600 mg via ORAL
  Filled 2021-04-29: qty 2

## 2021-04-29 MED ORDER — FENTANYL 100 MCG/HR TD PT72: 1.0000 | MEDICATED_PATCH | TRANSDERMAL | Status: DC

## 2021-04-29 NOTE — ED Notes (Signed)
Pt up to bathroom with wheelchair and x1 assist.

## 2021-04-29 NOTE — ED Notes (Signed)
Assisted to and from bathroom with 1 person assist into wheelchair. Pt able to stand and pivot with stand by assistance

## 2021-04-29 NOTE — BH Assessment (Signed)
Referral information for Psychiatric Hospitalization faxed to;   [facility] [phone]- [phone]),   [facility] [phone] -or- [phone])  [facility] [phone]),  [facility] [phone], [phone], [phone] or [phone]),   [facility] [phone] or [phone])  [facility] [phone]),   [facility] [phone] -or- [phone]),   [facility] [phone]).  [facility] [phone]).

## 2021-04-29 NOTE — ED Provider Notes (Signed)
Emergency Medicine Observation Re-evaluation Note  Karen [name] is a 45 y.o. female, seen on rounds today.  Pt initially presented to the ED for complaints of Medication Refill Currently, the patient is sleeping comfortably.  Physical Exam  BP 108/80 (BP Location: Left Arm)   Pulse 80   Temp 98.7 F (37.1 C) (Tympanic)   Resp 18   Ht '4\' 11"'$  (1.499 m)   Wt 59.9 kg   SpO2 98%   BMI 26.67 kg/m  Physical Exam General: Sleeping comfortably, no acute distress Lungs: Respirations are even and unlabored Psych: No agitation at this time  ED Course / MDM  EKG:   I have reviewed the labs performed to date as well as medications administered while in observation.  Recent changes in the last 24 hours include patient seen and evaluated by psychiatry, plan is for inpatient psychiatric admission.  Plan  Current plan is for patient psychiatric admission. Karen [name] is under involuntary commitment.      Rada Hay, MD 04/29/21 [phone]

## 2021-04-29 NOTE — ED Provider Notes (Addendum)
[facility] Emergency Department Provider Note  ____________________________________________   Event Date/Time   First MD Initiated Contact with Patient 04/29/21 0830     (approximate)  I have reviewed the triage vital signs and the nursing notes.   HISTORY  Chief Complaint Medication Refill    HPI Karen [name] is a 45 y.o. female with past medical history as below here with paranoia.  The patient reportedly was here for the last 48 hours.  She had checked in for possible paranoia and was actually excepted to an outside facility.  She reportedly refused transport and was deemed stable for discharge.  She reportedly has been out in the waiting room since then, and checked back in this morning.  On my assessment, the patient is very agitated.  She states she is here because she needs to get back on her medications.  However, she also states that she feels like the fire alarm that went off just prior to our interview was put on by the hospital in order to drown out her brothers screams, who is currently being kept here.  She reportedly has been hallucinating about her brother and incredibly paranoid at home, refusing take her medications due to fear of being poisoned.  I called her mother who confirmed all this and states this is significantly worse than her baseline.  She has not been acting like herself.  She does not recall any recent illnesses or medication changes.  She did recently have a hospitalization for UTI but has been recovering from this perspective.  No fevers.  Remainder of history limited on my assessment due to patient paranoia as well as psychiatric condition.     Past Medical History:  Diagnosis Date   Ehlers-Danlos syndrome    Fibromyalgia    Headache    Sleep apnea    Tics of organic origin    Transient alteration of awareness     Patient Active Problem List   Diagnosis Date Noted   Acute metabolic encephalopathy [id number]    Asthma 04/22/2021   Paranoid ([place]) 04/22/2021   Psychosis ([place]) 04/22/2021   UTI (urinary tract infection) 04/22/2021   Sepsis ([place]) 04/22/2021   Urinary retention 04/22/2021   Chronic tension type headache 06/04/2020   Breast lump on left side at 1 o'clock position 10/09/2019   Fibrocystic breast changes, bilateral 10/09/2019   Family history of colon cancer    Rectal polyp    Heartburn    Excessive somnolence disorder 06/28/2017   Seizures ([place]) 09/12/2016   Difficulty hearing 08/31/2015   Back pain, chronic 08/31/2015   Rheumatoid arthritis ([place]) 08/31/2015   Alopecia 03/10/2015   Anemia 03/10/2015   Arthritis 03/10/2015   Allergic asthma, mild persistent, uncomplicated [id number]   Chronic nausea 03/10/2015   DDD (degenerative disc disease), lumbar 03/10/2015   Depression with anxiety 03/10/2015   Personal history of MRSA (methicillin resistant Staphylococcus aureus) 03/10/2015   Insomnia 03/10/2015   Mitral valve prolapse 03/10/2015   Paresthesias/numbness 03/10/2015   Allergic rhinitis, seasonal 03/10/2015   Seizure disorder ([place]) 03/10/2015   Mixed sleep apnea, Central predominant 03/10/2015   Migraine without aura 01/27/2014   Fibromyalgia 01/27/2014   Transient alteration of awareness 01/07/2014   Tics of organic origin 01/07/2014   Backache, unspecified 01/07/2014   Ehlers-Danlos syndrome 01/07/2014    Past Surgical History:  Procedure Laterality Date   COLONOSCOPY WITH PROPOFOL N/A 02/13/2018   Procedure: COLONOSCOPY WITH PROPOFOL;  Surgeon: [name], MD;  Location: [facility]  ENDOSCOPY;  Service: Endoscopy;  Laterality: N/A;   DILATION AND CURETTAGE OF UTERUS  2009   ESOPHAGOGASTRODUODENOSCOPY (EGD) WITH PROPOFOL  02/13/2018   Procedure: ESOPHAGOGASTRODUODENOSCOPY (EGD) WITH PROPOFOL;  Surgeon: [name], MD;  Location: [facility] ENDOSCOPY;  Service: Endoscopy;;   EYE SURGERY Left 1990   3 Surgeries on left eye to correct crossed eye   LAPAROSCOPY Left 2003    Fallopian Tube   [place]   3 Surgeries to repair broken arm   OTHER SURGICAL HISTORY Left    3 surgeries on her left thigh as an infant   OTHER SURGICAL HISTORY  1998   Jaw surgery   Right arm surgery  1987   x 3 due to fracture   TOENAIL EXCISION Bilateral 06/13/2019   Procedure: BILATERAL SECOND TOE PARTIAL NAIL ABLATION;  Surgeon: Erle Crocker, MD;  Location: [facility];  Service: Orthopedics;  Laterality: Bilateral;  SURGERY REQUEST TIME 1 HOUR   TONSILLECTOMY  12/13/2002    Prior to Admission medications   Medication Sig Start Date End Date Taking? Authorizing Provider  albuterol (VENTOLIN HFA) 108 (90 Base) MCG/ACT inhaler Inhale 2 puffs into the lungs every 6 (six) hours as needed. 06/27/19   Deneise Lever, MD  azelastine (OPTIVAR) 0.05 % ophthalmic solution Apply to eye as directed. 1 drop in each eye prn 02/13/20   [provider]  busPIRone (BUSPAR) 10 MG tablet Take 1 tablet (10 mg total) by mouth 2 (two) times daily. Take along with '15mg'$  tablet twice a day 03/23/21   Birdie Sons, MD  busPIRone (BUSPAR) 15 MG tablet TAKE 1 TABLET BY MOUTH 2 TIMES DAILY. 12/30/20   Birdie Sons, MD  celecoxib (CELEBREX) 200 MG capsule TAKE 1 CAPSULE BY MOUTH TWICE A DAY 03/01/21   Birdie Sons, MD  cyclobenzaprine (FLEXERIL) 5 MG tablet Take 1 tablet (5 mg total) by mouth 3 (three) times daily as needed for muscle spasms. Do not take within 6 hours of taking tizandine 03/26/21   Birdie Sons, MD  diclofenac Sodium (VOLTAREN) 1 % GEL APPLY AS NEEDED 3 TIMES A DAY 11/16/20   Birdie Sons, MD  DULoxetine (CYMBALTA) 60 MG capsule TAKE 2 CAPSULES BY MOUTH EVERY DAY 02/03/21   Birdie Sons, MD  fentaNYL (DURAGESIC) 100 MCG/HR Place 1 patch onto the skin every 3 (three) days. 04/27/21   Birdie Sons, MD  fexofenadine (ALLEGRA) 180 MG tablet Take 180 mg by mouth daily.    [provider]   fluticasone (FLONASE) 50 MCG/ACT nasal spray SPRAY 2 SPRAYS INTO EACH NOSTRIL EVERY DAY Patient not taking: Reported on 04/27/2021 05/10/18   Chrismon, Vickki Muff, PA-C  fluticasone (FLOVENT HFA) 110 MCG/ACT inhaler Inhale 23 puffs into the lungs 2 (two) times daily. 04/25/21   [provider]  Fluticasone-Salmeterol (ADVAIR) 250-50 MCG/DOSE AEPB Inhale 1 puff into the lungs every 12 (twelve) hours. Rinse mouth after use Patient not taking: Reported on 04/27/2021 04/02/20   Baird Lyons D, MD  gabapentin (NEURONTIN) 600 MG tablet TAKE 2 TABLETS IN THE MORNING AND 3 TABLETS AT BEDTIME 01/15/21   Jodi Geralds, MD  haloperidol (HALDOL) 1 MG tablet Take 1 mg by mouth 2 (two) times daily. Patient states she should be and has been on 5 mg twice daily. 04/15/21   [provider]  naloxone [facility]) nasal spray 4 mg/0.1 mL Place 1 spray into the nose  as needed. 04/15/21   [provider]  omeprazole (PRILOSEC) 40 MG capsule TAKE 1 CAPSULE (40 MG TOTAL) BY MOUTH DAILY. 04/10/21   Birdie Sons, MD  ondansetron (ZOFRAN-ODT) 4 MG disintegrating tablet DISSOLVE 1 TABLET IN MOUTH EVERY 8 HOURS FOR NAUSEA AND VOMITING 09/29/20   Birdie Sons, MD  pregabalin (LYRICA) 300 MG capsule Take 1 capsule (300 mg total) by mouth 2 (two) times daily. 12/30/20   Birdie Sons, MD  QUEtiapine (SEROQUEL) 200 MG tablet TAKE 1 TABLET BY MOUTH AT BEDTIME. 12/30/20   Birdie Sons, MD  tamsulosin (FLOMAX) 0.4 MG CAPS capsule Take 1 capsule (0.4 mg total) by mouth daily. 02/22/21   MacDiarmid, Nicki Reaper, MD  tiZANidine (ZANAFLEX) 4 MG tablet TAKE 0.5-1 TABLETS (2-4 MG TOTAL) BY MOUTH EVERY 8 (EIGHT) HOURS AS NEEDED FOR MUSCLE SPASMS. 04/14/21   Birdie Sons, MD    Allergies Augmentin  [amoxicillin-pot clavulanate], Chocolate flavor, Demerol [meperidine], Keflex [cephalexin], Morphine and related, Tape, Toradol [ketorolac tromethamine], Chocolate, Nsaids, and Strawberry extract  Family History   Problem Relation Age of Onset   Depression Mother    Stroke Mother    Fibromyalgia Mother    Osteoarthritis Mother    Asthma Brother    Depression Brother    Migraines Brother    Asperger's syndrome Brother    Other Brother        Ehlers-Danlos Syndrome   Cancer Maternal Aunt    Asperger's syndrome Maternal Aunt    Breast cancer Maternal Aunt    Heart disease Paternal Aunt    Heart disease Paternal Uncle    Cervical cancer Maternal Grandmother    Osteoporosis Maternal Grandmother        Died at 73   Osteoarthritis Maternal Grandmother    Colon cancer Maternal Grandfather    Asthma Maternal Grandfather    Asperger's syndrome Maternal Grandfather    Emphysema Maternal Grandfather        Died at 67   Prostate cancer Maternal Grandfather    Heart attack Paternal Grandfather        Died at 29   Asthma Paternal Grandfather    Tuberculosis Paternal Grandfather    Cancer Cousin    Asperger's syndrome Cousin    Asperger's syndrome Other    Heart Problems Paternal Grandmother        Died at 4   Ehlers-Danlos syndrome Father    Ehlers-Danlos syndrome Brother     Social History Social History   Tobacco Use   Smoking status: Never   Smokeless tobacco: Never  Vaping Use   Vaping Use: Never used  Substance Use Topics   Alcohol use: No    Alcohol/week: 0.0 standard drinks   Drug use: No    Review of Systems  Review of Systems  Unable to perform ROS: Psychiatric disorder    ____________________________________________  PHYSICAL EXAM:      VITAL SIGNS: ED Triage Vitals  Enc Vitals Group     BP 04/29/21 0400 (!) 139/100     Pulse Rate 04/29/21 0400 95     Resp 04/29/21 0400 18     Temp 04/29/21 0400 98.7 F (37.1 C)     Temp Source 04/29/21 0400 Oral     SpO2 04/29/21 0400 98 %     Weight 04/29/21 0405 132 lb 0.9 oz (59.9 kg)     Height 04/29/21 0405 '4\' 11"'$  (1.499 m)     Head Circumference --  Peak Flow --      Pain Score 04/29/21 0404 10     Pain Loc  --      Pain Edu? --      Excl. in [place]? --      Physical Exam Vitals and nursing note reviewed.  Constitutional:      General: She is not in acute distress.    Appearance: She is well-developed.  HENT:     Head: Normocephalic and atraumatic.  Eyes:     Conjunctiva/sclera: Conjunctivae normal.  Cardiovascular:     Rate and Rhythm: Normal rate and regular rhythm.     Heart sounds: Normal heart sounds.  Pulmonary:     Effort: Pulmonary effort is normal. No respiratory distress.     Breath sounds: No wheezing.  Abdominal:     General: There is no distension.  Musculoskeletal:     Cervical back: Neck supple.  Skin:    General: Skin is warm.     Capillary Refill: Capillary refill takes less than 2 seconds.     Findings: No rash.  Neurological:     Mental Status: She is alert and oriented to person, place, and time.     Motor: No abnormal muscle tone.  Psychiatric:        Mood and Affect: Mood is anxious.        Thought Content: Thought content is paranoid.        Cognition and Memory: Cognition is impaired.        Judgment: Judgment is impulsive.      ____________________________________________   LABS (all labs ordered are listed, but only abnormal results are displayed)  Labs Reviewed  COMPREHENSIVE METABOLIC PANEL - Abnormal; Notable for the following components:      Result Value   Glucose, Bld 112 (*)    AST 14 (*)    All other components within normal limits  CBC WITH DIFFERENTIAL/PLATELET - Abnormal; Notable for the following components:   WBC 10.8 (*)    RBC 5.19 (*)    Hemoglobin 15.6 (*)    All other components within normal limits  RESP PANEL BY RT-PCR (FLU A&B, COVID) ARPGX2  ETHANOL  URINE DRUG SCREEN, QUALITATIVE ([facility] ONLY)  POC URINE PREG, ED    ____________________________________________  EKG: Normal sinus rhythm, ventricular rate 73.  PR 140, QRS 86, QTc 445.  No acute ST elevations or depressions.  EKG evidence of acute ischemia or  infarct. ________________________________________  RADIOLOGY All imaging, including plain films, CT scans, and ultrasounds, independently reviewed by me, and interpretations confirmed via formal radiology reads.  ED MD interpretation:     Official radiology report(s): No results found.  ____________________________________________  PROCEDURES   Procedure(s) performed (including Critical Care):  Procedures  ____________________________________________  INITIAL IMPRESSION / MDM / [place] / ED COURSE  As part of my medical decision making, I reviewed the following data within the [place] notes reviewed and incorporated, Old chart reviewed, Notes from prior ED visits, and [place] Controlled Substance Database       *Karen [name] was evaluated in Emergency Department on 04/29/2021 for the symptoms described in the history of present illness. She was evaluated in the context of the global COVID-19 pandemic, which necessitated consideration that the patient might be at risk for infection with the SARS-CoV-2 virus that causes COVID-19. Institutional protocols and algorithms that pertain to the evaluation of patients at risk for COVID-19 are in a state of rapid change  based on information released by regulatory bodies including the CDC and federal and state organizations. These policies and algorithms were followed during the patient's care in the ED.  Some ED evaluations and interventions may be delayed as a result of limited staffing during the pandemic.*     Medical Decision Making: 45 year old female here with paranoia and generalized confusion.  On my assessment, the patient exhibits severe paranoia with prescript oriented paranoid hallucinations concerning for decompensated psychiatric condition.  She does have history of recent UTI but urinalysis obtained yesterday is unremarkable and I suspect this is primarily psychiatric.  No apparent medical  emergency.  Screening lab work obtained, reviewed, and is unremarkable.  Discussed with her mother who confirms patient is very much different from her baseline.  Given her paranoia and hallucinations, do not feel she has capacity to leave on her own well at this time.  Will IVC.  Plan to likely admit for psychiatric stabilization.  Will resume her home meds.  Psychiatry consulted.  ____________________________________________  FINAL CLINICAL IMPRESSION(S) / ED DIAGNOSES  Final diagnoses:  Paranoia ([place])     MEDICATIONS GIVEN DURING THIS VISIT:  Medications  haloperidol (HALDOL) tablet 1 mg (1 mg Oral Patient Refused/Not Given 04/29/21 1131)  busPIRone (BUSPAR) tablet 15 mg (15 mg Oral Patient Refused/Not Given 04/29/21 1131)  LORazepam (ATIVAN) tablet 1 mg (1 mg Oral Patient Refused/Not Given 04/29/21 1132)  gabapentin (NEURONTIN) capsule 600 mg (600 mg Oral Given 04/29/21 [id number])     ED Discharge Orders     None        Note:  This document was prepared using Dragon voice recognition software and may include unintentional dictation errors.   Duffy Bruce, MD 04/29/21 1148    Duffy Bruce, MD 04/29/21 1243

## 2021-04-29 NOTE — ED Notes (Signed)
Introduced self to pt. Pt tearful. When asked why she was crying, pt states she is in pain all over her body down to her fingertips, stating she needs fentanyl 148mg patch for Ehler's Danlos. Pt then stated she needed other meds. When asked, pt became angry with this RN, stating she wasn't going to take no antibiotics or amphetamines.

## 2021-04-29 NOTE — ED Triage Notes (Signed)
Pt to triage via w/c with no distress; pt c/o "pain all over and withdrawal from fentanyl"; pt has been sitting in waiting room since she refused to leave with [facility] yesterday evening; upon security inquiring over pt leaving, pt reports that she wants to be seen again for medication

## 2021-04-29 NOTE — ED Notes (Signed)
Pt moved from [place] into [facility]. Patient reports she is unable to stand and "isn't supposed to stand" due to her chronic medical conditions. Pt reports wanting to go home but is made aware that she is under an involuntary commitment order. Reports that her brother is now her POA and not her mother.

## 2021-04-29 NOTE — Consult Note (Signed)
[facility] Consult   Reason for Consult: Consult for 45 year old woman brought back to the emergency room immediately after discharge at this time initially with chief complaints of pain Referring Physician: Ellender Hose Patient Identification: ADRYANNA STRIDER MRN:  [id number] Principal Diagnosis: Psychosis [facility]) Diagnosis:  Principal Problem:   Psychosis ([place]) Active Problems:   Tics of organic origin   Ehlers-Danlos syndrome   Migraine without aura   Fibromyalgia   Total Time spent with patient: 1 hour  Subjective:   DENNICE FRIERSON is a 45 y.o. female patient admitted with "I need my medicines back".  HPI: Patient seen chart reviewed.  Patient had been seen previously in the emergency room.  This is a 45 year old woman with multiple chronic debilitating medical conditions who was brought to the emergency room originally at the wishes of her family because of possible return of catatonic symptoms.  After evaluation yesterday in the emergency room, the patient was referred for inpatient psychiatric treatment.  She refused transportation however and was then discharged home by the emergency room physician.  Patient refused to leave the lobby and now has been readmitted to the emergency room.  Her chief complaint was her pain although in discussion with [place] she complained not only of full body pain but of general malaise and incapacity.  Patient is very disorganized in her speech currently.  She displays paranoia and appears to be delusional.  Talks about her brother being in something she calls "hospital jail" here in the emergency room.  Also talks about several TV actors who she blames for her situation.  Patient appears to be having confused thoughts.  Despite lays inappropriate affect as well with irritability and sarcasm displayed in inappropriate ways.  She was unable to engage in any sort of reasonable discussion of appropriate treatment plan.  Past Psychiatric History:  Patient had an admission at [facility] for psychotic symptoms recently.  Prior to that she does not appear to have had specific psychiatric diagnoses or treatment.  Risk to Self:   Risk to Others:   Prior Inpatient Therapy:   Prior Outpatient Therapy:    Past Medical History:  Past Medical History:  Diagnosis Date   Ehlers-Danlos syndrome    Fibromyalgia    Headache    Sleep apnea    Tics of organic origin    Transient alteration of awareness     Past Surgical History:  Procedure Laterality Date   COLONOSCOPY WITH PROPOFOL N/A 02/13/2018   Procedure: COLONOSCOPY WITH PROPOFOL;  Surgeon: [name], MD;  Location: [facility] ENDOSCOPY;  Service: Endoscopy;  Laterality: N/A;   DILATION AND CURETTAGE OF UTERUS  2009   ESOPHAGOGASTRODUODENOSCOPY (EGD) WITH PROPOFOL  02/13/2018   Procedure: ESOPHAGOGASTRODUODENOSCOPY (EGD) WITH PROPOFOL;  Surgeon: [name], MD;  Location: [facility]OSCOPY;  Service: Endoscopy;;   EYE SURGERY Left 1990   3 Surgeries on left eye to correct crossed eye   LAPAROSCOPY Left 2003   Fallopian Tube   [place]   3 Surgeries to repair broken arm   OTHER SURGICAL HISTORY Left    3 surgeries on her left thigh as an infant   OTHER SURGICAL HISTORY  1998   Jaw surgery   Right arm surgery  1987   x 3 due to fracture   TOENAIL EXCISION Bilateral 06/13/2019   Procedure: BILATERAL SECOND TOE PARTIAL NAIL ABLATION;  Surgeon: Erle Crocker, MD;  Location: [facility];  Service: Orthopedics;  Laterality: Bilateral;  SURGERY REQUEST TIME 1 HOUR   TONSILLECTOMY  12/13/2002   Family History:  Family History  Problem Relation Age of Onset   Depression Mother    Stroke Mother    Fibromyalgia Mother    Osteoarthritis Mother    Asthma Brother    Depression Brother    Migraines Brother    Asperger's syndrome Brother    Other Brother        Ehlers-Danlos Syndrome   Cancer Maternal Aunt     Asperger's syndrome Maternal Aunt    Breast cancer Maternal Aunt    Heart disease Paternal Aunt    Heart disease Paternal Uncle    Cervical cancer Maternal Grandmother    Osteoporosis Maternal Grandmother        Died at 67   Osteoarthritis Maternal Grandmother    Colon cancer Maternal Grandfather    Asthma Maternal Grandfather    Asperger's syndrome Maternal Grandfather    Emphysema Maternal Grandfather        Died at 61   Prostate cancer Maternal Grandfather    Heart attack Paternal Grandfather        Died at 54   Asthma Paternal Grandfather    Tuberculosis Paternal Grandfather    Cancer Cousin    Asperger's syndrome Cousin    Asperger's syndrome Other    Heart Problems Paternal Grandmother        Died at 20   Ehlers-Danlos syndrome Father    Ehlers-Danlos syndrome Brother    Family Psychiatric  History: Unknown Social History:  Social History   Substance and Sexual Activity  Alcohol Use No   Alcohol/week: 0.0 standard drinks     Social History   Substance and Sexual Activity  Drug Use No    Social History   Socioeconomic History   Marital status: Single    Spouse name: Not on file   Number of children: Not on file   Years of education: Not on file   Highest education level: Not on file  Occupational History   Not on file  Tobacco Use   Smoking status: Never   Smokeless tobacco: Never  Vaping Use   Vaping Use: Never used  Substance and Sexual Activity   Alcohol use: No    Alcohol/week: 0.0 standard drinks   Drug use: No   Sexual activity: Never  Other Topics Concern   Not on file  Social History Narrative   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    She lives with her mother.    She enjoys reading, watching TV, writing, and painting.    Social Determinants of Health   Financial Resource Strain: Not on file  Food Insecurity: Not on file  Transportation Needs: Not on file  Physical Activity: Not on file  Stress: Not on file  Social Connections: Not on  file   Additional Social History:    Allergies:   Allergies  Allergen Reactions   Augmentin  [Amoxicillin-Pot Clavulanate] Diarrhea   Chocolate Flavor     Other reaction(s): Other (See Comments) Wheezing, acne   Demerol [Meperidine] Other (See Comments)    Slow to wake up when this drug is given.    Keflex [Cephalexin] Nausea And Vomiting   Morphine And Related Nausea And Vomiting   Tape Dermatitis    Must use paper tape only   Toradol [Ketorolac Tromethamine] Nausea And Vomiting   Chocolate     GI distress   Nsaids  patient develops ulcers   Strawberry Extract     GI distress    Labs:  Results for orders placed or performed during the hospital encounter of 04/29/21 (from the past 48 hour(s))  Resp Panel by RT-PCR (Flu A&B, Covid) Nasopharyngeal Swab     Status: None   Collection Time: 04/29/21  9:13 AM   Specimen: Nasopharyngeal Swab; Nasopharyngeal(NP) swabs in vial transport medium  Result Value Ref Range   SARS Coronavirus 2 by RT PCR NEGATIVE NEGATIVE    Comment: (NOTE) SARS-CoV-2 target nucleic acids are NOT DETECTED.  The SARS-CoV-2 RNA is generally detectable in upper respiratory specimens during the acute phase of infection. The lowest concentration of SARS-CoV-2 viral copies this assay can detect is 138 copies/mL. A negative result does not preclude SARS-Cov-2 infection and should not be used as the sole basis for treatment or other patient management decisions. A negative result may occur with  improper specimen collection/handling, submission of specimen other than nasopharyngeal swab, presence of viral mutation(s) within the areas targeted by this assay, and inadequate number of viral copies(<138 copies/mL). A negative result must be combined with clinical observations, patient history, and epidemiological information. The expected result is Negative.  Fact Sheet for Patients:  EntrepreneurPulse.com.au  Fact Sheet for Healthcare  Providers:  IncredibleEmployment.be  This test is no t yet approved or cleared by the [place] FDA and  has been authorized for detection and/or diagnosis of SARS-CoV-2 by FDA under an Emergency Use Authorization (EUA). This EUA will remain  in effect (meaning this test can be used) for the duration of the COVID-19 declaration under Section 564(b)(1) of the Act, 21 U.S.C.section 360bbb-3(b)(1), unless the authorization is terminated  or revoked sooner.       Influenza A by PCR NEGATIVE NEGATIVE   Influenza B by PCR NEGATIVE NEGATIVE    Comment: (NOTE) The Xpert Xpress SARS-CoV-2/FLU/RSV plus assay is intended as an aid in the diagnosis of influenza from Nasopharyngeal swab specimens and should not be used as a sole basis for treatment. Nasal washings and aspirates are unacceptable for Xpert Xpress SARS-CoV-2/FLU/RSV testing.  Fact Sheet for Patients: EntrepreneurPulse.com.au  Fact Sheet for Healthcare Providers: IncredibleEmployment.be  This test is not yet approved or cleared by the [place] FDA and has been authorized for detection and/or diagnosis of SARS-CoV-2 by FDA under an Emergency Use Authorization (EUA). This EUA will remain in effect (meaning this test can be used) for the duration of the COVID-19 declaration under Section 564(b)(1) of the Act, 21 U.S.C. section 360bbb-3(b)(1), unless the authorization is terminated or revoked.  Performed at [facility], [address] [place], [place] [address]   Comprehensive metabolic panel     Status: Abnormal   Collection Time: 04/29/21  9:13 AM  Result Value Ref Range   Sodium 138 135 - 145 mmol/L   Potassium 3.5 3.5 - 5.1 mmol/L   Chloride 103 98 - 111 mmol/L   CO2 25 22 - 32 mmol/L   Glucose, Bld 112 (H) 70 - 99 mg/dL    Comment: Glucose reference range applies only to samples taken after fasting for at least 8 hours.   BUN 14 6 - 20 mg/dL    Creatinine, Ser 0.76 0.44 - 1.00 mg/dL   Calcium 9.1 8.9 - 10.3 mg/dL   Total Protein 7.4 6.5 - 8.1 g/dL   Albumin 4.1 3.5 - 5.0 g/dL   AST 14 (L) 15 - 41 U/L   ALT 12 0 - 44 U/L  Alkaline Phosphatase 69 38 - 126 U/L   Total Bilirubin 0.8 0.3 - 1.2 mg/dL   GFR, Estimated >60 >60 mL/min    Comment: (NOTE) Calculated using the CKD-EPI Creatinine Equation (2021)    Anion gap 10 5 - 15    Comment: Performed at [facility], [address] [place], [place] [address]  Ethanol     Status: None   Collection Time: 04/29/21  9:13 AM  Result Value Ref Range   Alcohol, Ethyl (B) <10 <10 mg/dL    Comment: (NOTE) Lowest detectable limit for serum alcohol is 10 mg/dL.  For medical purposes only. Performed at [facility], [address] [place], [place] [address]   CBC with Diff     Status: Abnormal   Collection Time: 04/29/21  9:13 AM  Result Value Ref Range   WBC 10.8 (H) 4.0 - 10.5 K/uL   RBC 5.19 (H) 3.87 - 5.11 MIL/uL   Hemoglobin 15.6 (H) 12.0 - 15.0 g/dL   HCT 44.4 36.0 - 46.0 %   MCV 85.5 80.0 - 100.0 fL   MCH 30.1 26.0 - 34.0 pg   MCHC 35.1 30.0 - 36.0 g/dL   RDW 13.3 11.5 - 15.5 %   Platelets 351 150 - 400 K/uL   nRBC 0.0 0.0 - 0.2 %   Neutrophils Relative % 68 %   Neutro Abs 7.4 1.7 - 7.7 K/uL   Lymphocytes Relative 22 %   Lymphs Abs 2.4 0.7 - 4.0 K/uL   Monocytes Relative 7 %   Monocytes Absolute 0.7 0.1 - 1.0 K/uL   Eosinophils Relative 1 %   Eosinophils Absolute 0.2 0.0 - 0.5 K/uL   Basophils Relative 1 %   Basophils Absolute 0.1 0.0 - 0.1 K/uL   Immature Granulocytes 1 %   Abs Immature Granulocytes 0.05 0.00 - 0.07 K/uL    Comment: Performed at [facility], [address], [place], [place] [address]    Current Facility-Administered Medications  Medication Dose Route Frequency Provider Last Rate Last Admin   busPIRone (BUSPAR) tablet 15 mg  15 mg Oral Once Duffy Bruce, MD       haloperidol (HALDOL) tablet 1 mg  1 mg Oral  Once Duffy Bruce, MD       LORazepam (ATIVAN) tablet 1 mg  1 mg Oral Once Duffy Bruce, MD       Current Outpatient Medications  Medication Sig Dispense Refill   albuterol (VENTOLIN HFA) 108 (90 Base) MCG/ACT inhaler Inhale 2 puffs into the lungs every 6 (six) hours as needed. 8 g 12   azelastine (OPTIVAR) 0.05 % ophthalmic solution Apply to eye as directed. 1 drop in each eye prn     busPIRone (BUSPAR) 10 MG tablet Take 1 tablet (10 mg total) by mouth 2 (two) times daily. Take along with '15mg'$  tablet twice a day 180 tablet 4   busPIRone (BUSPAR) 15 MG tablet TAKE 1 TABLET BY MOUTH 2 TIMES DAILY. 180 tablet 2   celecoxib (CELEBREX) 200 MG capsule TAKE 1 CAPSULE BY MOUTH TWICE A DAY 60 capsule 5   cyclobenzaprine (FLEXERIL) 5 MG tablet Take 1 tablet (5 mg total) by mouth 3 (three) times daily as needed for muscle spasms. Do not take within 6 hours of taking tizandine 30 tablet 2   diclofenac Sodium (VOLTAREN) 1 % GEL APPLY AS NEEDED 3 TIMES A DAY 100 g 0   DULoxetine (CYMBALTA) 60 MG capsule TAKE 2 CAPSULES BY MOUTH EVERY DAY 180  capsule 3   fentaNYL (DURAGESIC) 100 MCG/HR Place 1 patch onto the skin every 3 (three) days. 10 patch 0   fexofenadine (ALLEGRA) 180 MG tablet Take 180 mg by mouth daily.     fluticasone (FLONASE) 50 MCG/ACT nasal spray SPRAY 2 SPRAYS INTO EACH NOSTRIL EVERY DAY (Patient not taking: Reported on 04/27/2021) 16 g 2   fluticasone (FLOVENT HFA) 110 MCG/ACT inhaler Inhale 23 puffs into the lungs 2 (two) times daily.     Fluticasone-Salmeterol (ADVAIR) 250-50 MCG/DOSE AEPB Inhale 1 puff into the lungs every 12 (twelve) hours. Rinse mouth after use (Patient not taking: Reported on 04/27/2021) 60 each 11   gabapentin (NEURONTIN) 600 MG tablet TAKE 2 TABLETS IN THE MORNING AND 3 TABLETS AT BEDTIME 155 tablet 5   haloperidol (HALDOL) 1 MG tablet Take 1 mg by mouth 2 (two) times daily. Patient states she should be and has been on 5 mg twice daily.     naloxone (NARCAN) nasal  spray 4 mg/0.1 mL Place 1 spray into the nose as needed.     omeprazole (PRILOSEC) 40 MG capsule TAKE 1 CAPSULE (40 MG TOTAL) BY MOUTH DAILY. 30 capsule 1   ondansetron (ZOFRAN-ODT) 4 MG disintegrating tablet DISSOLVE 1 TABLET IN MOUTH EVERY 8 HOURS FOR NAUSEA AND VOMITING 30 tablet 5   pregabalin (LYRICA) 300 MG capsule Take 1 capsule (300 mg total) by mouth 2 (two) times daily. 180 capsule 1   QUEtiapine (SEROQUEL) 200 MG tablet TAKE 1 TABLET BY MOUTH AT BEDTIME. 90 tablet 5   tamsulosin (FLOMAX) 0.4 MG CAPS capsule Take 1 capsule (0.4 mg total) by mouth daily. 90 capsule 3   tiZANidine (ZANAFLEX) 4 MG tablet TAKE 0.5-1 TABLETS (2-4 MG TOTAL) BY MOUTH EVERY 8 (EIGHT) HOURS AS NEEDED FOR MUSCLE SPASMS. 270 tablet 1    Musculoskeletal: Strength & Muscle Tone: atrophy Gait & Station: unable to stand Patient leans: N/A            Psychiatric Specialty Exam:  Presentation  General Appearance: Bizarre  Eye Contact:None  Speech:Clear and Coherent  Speech Volume:Normal  Handedness:Right   Mood and Affect  Mood:Depressed; Anxious; Irritable  Affect:Inappropriate; Full Range   Thought Process  Thought Processes:Coherent  Descriptions of Associations:Tangential  Orientation:Full (Time, Place and Person)  Thought Content:Logical; Paranoid Ideation  History of Schizophrenia/Schizoaffective disorder:No  Duration of Psychotic Symptoms:Less than six months  Hallucinations:No data recorded Ideas of Reference:None  Suicidal Thoughts:No data recorded Homicidal Thoughts:No data recorded  Sensorium  Memory:Immediate Good; Recent Good; Remote Good  Judgment:Impaired  Insight:Poor   Executive Functions  Concentration:Fair  Attention Span:Poor  Recall:Poor  Fund of Knowledge:Fair  Language:Fair   Psychomotor Activity  Psychomotor Activity: No data recorded  Assets  Assets:Communication Skills; Desire for Improvement; Physical Health; Resilience;  Social Support   Sleep  Sleep: No data recorded  Physical Exam: Physical Exam Vitals and nursing note reviewed.  Constitutional:      Appearance: Normal appearance.  HENT:     Head: Normocephalic and atraumatic.     Mouth/Throat:     Pharynx: Oropharynx is clear.  Eyes:     Pupils: Pupils are equal, round, and reactive to light.  Cardiovascular:     Rate and Rhythm: Normal rate and regular rhythm.  Pulmonary:     Effort: Pulmonary effort is normal.     Breath sounds: Normal breath sounds.  Abdominal:     General: Abdomen is flat.     Palpations: Abdomen is soft.  Musculoskeletal:  General: Normal range of motion.  Skin:    General: Skin is warm and dry.  Neurological:     General: No focal deficit present.     Mental Status: She is alert. Mental status is at baseline.  Psychiatric:        Attention and Perception: She is inattentive.        Mood and Affect: Mood is anxious. Affect is labile and inappropriate.        Speech: Speech is rapid and pressured and tangential.        Behavior: Behavior is agitated. Behavior is not aggressive.        Thought Content: Thought content is paranoid and delusional.        Cognition and Memory: Cognition is impaired. Memory is impaired.        Judgment: Judgment is inappropriate.   Review of Systems  Constitutional: Negative.   HENT: Negative.    Eyes: Negative.   Respiratory: Negative.    Cardiovascular: Negative.   Gastrointestinal: Negative.   Musculoskeletal:  Positive for myalgias.  Skin: Negative.   Neurological: Negative.   Psychiatric/Behavioral:  Positive for depression. Negative for hallucinations, substance abuse and suicidal ideas. The patient is nervous/anxious.   Blood pressure (!) 155/97, pulse 66, temperature 98.7 F (37.1 C), temperature source Oral, resp. rate 16, height '4\' 11"'$  (1.499 m), weight 59.9 kg, SpO2 99 %. Body mass index is 26.67 kg/m.  Treatment Plan Summary: Plan this 45 year old woman  currently is presenting with psychotic symptoms.  She is having pressured speech bizarre affect and disorganized thought with evidence of paranoia and psychosis.  Does not appear to be necessarily delirious although that would be in the differential given her multiple medications and medical conditions.  Case reviewed with ER physician.  Recommend patient be under IVC at this point because of her inability to make reasonable decisions for herself.  I have spoken with TTS and recommended that once again she be referred out for inpatient psychiatric treatment.  Our inpatient psychiatric ward would not be capable of managing her because of her medical issues.  Disposition: Recommend psychiatric Inpatient admission when medically cleared. Supportive therapy provided about ongoing stressors.  Alethia Berthold, MD 04/29/2021 11:41 AM

## 2021-04-29 NOTE — ED Notes (Signed)
Gave food tray with juice. 

## 2021-04-29 NOTE — ED Notes (Signed)
Pt provided milk and blanket.

## 2021-04-29 NOTE — ED Notes (Signed)
Patient given a snack

## 2021-04-29 NOTE — ED Notes (Signed)
Pt noted to have sunglasses on. Per previous shift-pt does have sensitivity to light

## 2021-04-29 NOTE — ED Notes (Signed)
Attempted to give patient medications, patient ignoring this RN at this time. Md Isaacs aware.

## 2021-04-29 NOTE — ED Provider Notes (Signed)
Spoke to patient's mom.  Patient's mom had called and asked if the patient was going to be transferred.  I have asked TTS to call back and discuss this a little bit more with patient's mom.   Nena Polio, MD 04/29/21 [phone]

## 2021-04-30 ENCOUNTER — Telehealth: Payer: Self-pay

## 2021-04-30 DIAGNOSIS — F22 Delusional disorders: Secondary | ICD-10-CM | POA: Diagnosis not present

## 2021-04-30 NOTE — ED Notes (Signed)
Will defer VS at this time, due to patient sleeping.

## 2021-04-30 NOTE — ED Notes (Signed)
Pt visitor at bedside after security check

## 2021-04-30 NOTE — Telephone Encounter (Signed)
Advised pt's mother that we do not have a copy of her POA.  She is going to look for it at home.   Thanks,   -Mickel Baas

## 2021-04-30 NOTE — ED Notes (Signed)
Pt requesting Zanaflex. Pharmacy called to tube medication to the ED

## 2021-04-30 NOTE — BH Assessment (Signed)
Referral Checks:    Karen [name] [id number]), Karen [name] reports denied due to Autism and TBI   [facility] [phone]- [phone]) Facility only accepts patients 31 and up   Karen [name] [phone]), No answer, voicemail was left to return phone call   Karen [name] [phone], [phone], [phone] or [phone]), No behavioral health intake staff available until after 8am   [place] [facility] [phone] or [phone]) No answer, voicemail was left   [facility] [phone]), Referral re-sent at 2:19am on 04/30/21   [facility] [phone] -or- [phone]), Karen [name] reports denied due to Fentanyl patch   Karen [name] [phone]). No answer, voicemail is full   [facility] [phone]).  Referral re-sent at 2:19am on 04/30/21  Additional facility faxed to on 04/30/21 2:19am:   [facility] [phone])

## 2021-04-30 NOTE — ED Notes (Signed)
IVC/Pending Placement 

## 2021-04-30 NOTE — Telephone Encounter (Signed)
Copied from [place] [phone]. Topic: General - Other >> Apr 30, 2021  9:00 AM Valere Dross wrote: Reason for CRM: Pts mother called in stating pt is currently in the hospital and if refusing medical treatment, and pts mother needs the POA/DPR printed off so she can pick up, she states this is urgent. Please advise

## 2021-05-01 DIAGNOSIS — F22 Delusional disorders: Secondary | ICD-10-CM | POA: Diagnosis not present

## 2021-05-01 DIAGNOSIS — F331 Major depressive disorder, recurrent, moderate: Secondary | ICD-10-CM

## 2021-05-01 MED ORDER — BUSPIRONE HCL 5 MG PO TABS
25.0000 mg | ORAL_TABLET | Freq: Two times a day (BID) | ORAL | Status: DC
  Administered 2021-05-01 – 2021-05-04 (×6): 25 mg via ORAL
  Filled 2021-05-01 (×5): qty 5

## 2021-05-01 NOTE — BH Assessment (Signed)
Referral Checks:    Cristal Ford [id number]), John reports denied due to Autism and TBI   [facility] [phone]- [phone]) Facility only accepts patients 35 and up   Rosana Hoes [phone]), No answer, voicemail was left to return phone call   [facility] [phone], [phone], [phone] or [phone]), No behavioral health intake staff available until after 8am   [facility] [phone] or [phone]) No answer, voicemail was left   [facility] [phone]), Referral re-sent at 2:19am on 04/30/21   [facility] [phone]- [phone]), Izola Price reports denied due to Fentanyl patch   [facility] [phone]). No answer, voicemail is full   [facility] [phone]).  Referral re-sent at 2:19am on 04/30/21   Additional facility faxed to on 04/30/21 2:19am:   [facility] [phone]) Facility only accepts patients that are voluntary

## 2021-05-01 NOTE — ED Notes (Signed)
Report received from [place], Conservation officer, nature. Patient alert and oriented, warm and dry, and in no acute distress. Patient denies SI, HI, AVH and pain. Patient made aware of Q15 minute rounds and Engineer, drilling presence for their safety. Patient instructed to come to this nurse with needs or concerns.

## 2021-05-01 NOTE — ED Notes (Signed)
Offered pt a sandwich tray and a drink, but only wanted crackers.

## 2021-05-01 NOTE — ED Notes (Signed)
Pt assisted to restroom by Loree Fee, Therapist, sports. Pt is reported to be on her menstrual period. Provided with pad for sanitation.

## 2021-05-01 NOTE — ED Notes (Signed)
Pt provided with water for meds, keeps with bedside. Dr. Joni Fears notified of med refusal, no intervention at this time.

## 2021-05-01 NOTE — Consult Note (Signed)
[facility] Face-to-Face Psychiatry Consult   Reason for Consult:  delusions  Referring Physician:  EDP Patient Identification: Karen [name] MRN:  [id number] Principal Diagnosis: Psychosis ([place]) Diagnosis:  Principal Problem:   Psychosis ([place]) Active Problems:   Tics of organic origin   Ehlers-Danlos syndrome   Migraine without aura   Fibromyalgia   Total Time spent with patient: 30 minutes  Subjective:   Karen [name] is a 45 y.o. female patient admitted with delusions and pain medication request.  HPI:  45 yo female with chronic pain related to her neuromuscular disease, Ehlers-Danlos syndrome.  She came to the ED as she felt she was given the wrong Fentanyl patch.  When discharged by the EDP, she went to the lobby and would not leave.  She was seen last weekend by this provider as a consult as her mother was concerned about hallucinations that the patient "only" tells her about.  Denies hallucinations, psychosis, and paranoia today.  Not responding to internal stimuli.  She, however, does not plan on returning home and does not want her mother to know, "I am my own legal guardian." There is discord in the home and the client reports she is not returning there.  Denies suicidal and homicidal ideations.  She is eating her breakfast without issues, irritable about not having the bed completely flat.  Explained to her that this could not happen until she finished eating.  No distress noted.  Will continue to evaluate to observe any unusual behaviors that may be indicative of psychosis.  Past Psychiatric History: psychosis  Risk to Self:  none Risk to Others:  none Prior Inpatient Therapy:  none, [facility] ED Prior Outpatient Therapy:  [facility] referral  Past Medical History:  Past Medical History:  Diagnosis Date   Ehlers-Danlos syndrome    Fibromyalgia    Headache    Sleep apnea    Tics of organic origin    Transient alteration of awareness     Past Surgical History:  Procedure  Laterality Date   COLONOSCOPY WITH PROPOFOL N/A 02/13/2018   Procedure: COLONOSCOPY WITH PROPOFOL;  Surgeon: [name], MD;  Location: [facility] ENDOSCOPY;  Service: Endoscopy;  Laterality: N/A;   DILATION AND CURETTAGE OF UTERUS  2009   ESOPHAGOGASTRODUODENOSCOPY (EGD) WITH PROPOFOL  02/13/2018   Procedure: ESOPHAGOGASTRODUODENOSCOPY (EGD) WITH PROPOFOL;  Surgeon: [name], MD;  Location: [facility] ENDOSCOPY;  Service: Endoscopy;;   EYE SURGERY Left 1990   3 Surgeries on left eye to correct crossed eye   LAPAROSCOPY Left 2003   Fallopian Tube   [place]   3 Surgeries to repair broken arm   OTHER SURGICAL HISTORY Left    3 surgeries on her left thigh as an infant   OTHER SURGICAL HISTORY  1998   Jaw surgery   Right arm surgery  1987   x 3 due to fracture   TOENAIL EXCISION Bilateral 06/13/2019   Procedure: BILATERAL SECOND TOE PARTIAL NAIL ABLATION;  Surgeon: Erle Crocker, MD;  Location: [place];  Service: Orthopedics;  Laterality: Bilateral;  SURGERY REQUEST TIME 1 HOUR   TONSILLECTOMY  12/13/2002   Family History:  Family History  Problem Relation Age of Onset   Depression Mother    Stroke Mother    Fibromyalgia Mother    Osteoarthritis Mother    Asthma Brother    Depression Brother    Migraines Brother    Asperger's syndrome Brother  Other Brother        Ehlers-Danlos Syndrome   Cancer Maternal Aunt    Asperger's syndrome Maternal Aunt    Breast cancer Maternal Aunt    Heart disease Paternal Aunt    Heart disease Paternal Uncle    Cervical cancer Maternal Grandmother    Osteoporosis Maternal Grandmother        Died at 54   Osteoarthritis Maternal Grandmother    Colon cancer Maternal Grandfather    Asthma Maternal Grandfather    Asperger's syndrome Maternal Grandfather    Emphysema Maternal Grandfather        Died at 19   Prostate cancer Maternal Grandfather    Heart attack Paternal Grandfather         Died at 39   Asthma Paternal Grandfather    Tuberculosis Paternal Grandfather    Cancer Cousin    Asperger's syndrome Cousin    Asperger's syndrome Other    Heart Problems Paternal Grandmother        Died at 41   Ehlers-Danlos syndrome Father    Ehlers-Danlos syndrome Brother    Family Psychiatric  History: see above Social History:  Social History   Substance and Sexual Activity  Alcohol Use No   Alcohol/week: 0.0 standard drinks     Social History   Substance and Sexual Activity  Drug Use No    Social History   Socioeconomic History   Marital status: Single    Spouse name: Not on file   Number of children: Not on file   Years of education: Not on file   Highest education level: Not on file  Occupational History   Not on file  Tobacco Use   Smoking status: Never   Smokeless tobacco: Never  Vaping Use   Vaping Use: Never used  Substance and Sexual Activity   Alcohol use: No    Alcohol/week: 0.0 standard drinks   Drug use: No   Sexual activity: Never  Other Topics Concern   Not on file  Social History Narrative   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    She lives with her mother.    She enjoys reading, watching TV, writing, and painting.    Social Determinants of Health   Financial Resource Strain: Not on file  Food Insecurity: Not on file  Transportation Needs: Not on file  Physical Activity: Not on file  Stress: Not on file  Social Connections: Not on file   Additional Social History:    Allergies:   Allergies  Allergen Reactions   Augmentin  [Amoxicillin-Pot Clavulanate] Diarrhea   Chocolate Flavor     Other reaction(s): Other (See Comments) Wheezing, acne   Demerol [Meperidine] Other (See Comments)    Slow to wake up when this drug is given.    Keflex [Cephalexin] Nausea And Vomiting   Morphine And Related Nausea And Vomiting   Tape Dermatitis    Must use paper tape only   Toradol [Ketorolac Tromethamine] Nausea And Vomiting    Chocolate     GI distress   Nsaids     patient develops ulcers   Strawberry Extract     GI distress    Labs: No results found for this or any previous visit (from the past 48 hour(s)).  Current Facility-Administered Medications  Medication Dose Route Frequency Provider Last Rate Last Admin   albuterol (VENTOLIN HFA) 108 (90 Base) MCG/ACT inhaler 2 puff  2 puff Inhalation Q6H PRN Duffy Bruce, MD  budesonide (PULMICORT) nebulizer solution 0.25 mg  0.25 mg Nebulization BID Duffy Bruce, MD   0.25 mg at 04/30/21 2152   busPIRone (BUSPAR) tablet 15 mg  15 mg Oral Once Duffy Bruce, MD       busPIRone (BUSPAR) tablet 15 mg  15 mg Oral BID Duffy Bruce, MD   15 mg at 04/30/21 2153   celecoxib (CELEBREX) capsule 200 mg  200 mg Oral BID Duffy Bruce, MD   200 mg at 04/30/21 2154   DULoxetine (CYMBALTA) DR capsule 120 mg  120 mg Oral Daily Duffy Bruce, MD   120 mg at 04/30/21 1346   fentaNYL (DURAGESIC) 100 MCG/HR 1 patch  1 patch Transdermal Q72H [place], Abby K, RPH       haloperidol (HALDOL) tablet 1 mg  1 mg Oral BID Duffy Bruce, MD   1 mg at 04/30/21 2154   pantoprazole (PROTONIX) EC tablet 40 mg  40 mg Oral Daily Duffy Bruce, MD   40 mg at 04/30/21 1345   pregabalin (LYRICA) capsule 300 mg  300 mg Oral BID Duffy Bruce, MD   300 mg at 04/30/21 2154   QUEtiapine (SEROQUEL) tablet 200 mg  200 mg Oral QHS Duffy Bruce, MD   200 mg at 04/30/21 2153   tamsulosin (FLOMAX) capsule 0.4 mg  0.4 mg Oral Daily Duffy Bruce, MD   0.4 mg at 04/30/21 1345   tiZANidine (ZANAFLEX) tablet 2-4 mg  2-4 mg Oral Q8H PRN Duffy Bruce, MD   4 mg at 04/30/21 2152   Current Outpatient Medications  Medication Sig Dispense Refill   albuterol (VENTOLIN HFA) 108 (90 Base) MCG/ACT inhaler Inhale 2 puffs into the lungs every 6 (six) hours as needed. 8 g 12   azelastine (OPTIVAR) 0.05 % ophthalmic solution Apply to eye as directed. 1 drop in each eye prn     busPIRone  (BUSPAR) 10 MG tablet Take 1 tablet (10 mg total) by mouth 2 (two) times daily. Take along with '15mg'$  tablet twice a day 180 tablet 4   busPIRone (BUSPAR) 15 MG tablet TAKE 1 TABLET BY MOUTH 2 TIMES DAILY. 180 tablet 2   celecoxib (CELEBREX) 200 MG capsule TAKE 1 CAPSULE BY MOUTH TWICE A DAY 60 capsule 5   diclofenac Sodium (VOLTAREN) 1 % GEL APPLY AS NEEDED 3 TIMES A DAY 100 g 0   DULoxetine (CYMBALTA) 60 MG capsule TAKE 2 CAPSULES BY MOUTH EVERY DAY 180 capsule 3   fentaNYL (DURAGESIC) 100 MCG/HR Place 1 patch onto the skin every 3 (three) days. 10 patch 0   fexofenadine (ALLEGRA) 180 MG tablet Take 180 mg by mouth daily.     fluticasone (FLOVENT HFA) 110 MCG/ACT inhaler Inhale 2 puffs into the lungs 2 (two) times daily.     gabapentin (NEURONTIN) 600 MG tablet TAKE 2 TABLETS IN THE MORNING AND 3 TABLETS AT BEDTIME 155 tablet 5   haloperidol (HALDOL) 1 MG tablet Take 1 mg by mouth 2 (two) times daily. Patient states she should be and has been on 5 mg twice daily.     naloxone (NARCAN) nasal spray 4 mg/0.1 mL Place 1 spray into the nose as needed.     omeprazole (PRILOSEC) 40 MG capsule TAKE 1 CAPSULE (40 MG TOTAL) BY MOUTH DAILY. 30 capsule 1   ondansetron (ZOFRAN-ODT) 4 MG disintegrating tablet DISSOLVE 1 TABLET IN MOUTH EVERY 8 HOURS FOR NAUSEA AND VOMITING 30 tablet 5   pregabalin (LYRICA) 300 MG capsule Take 1 capsule (300 mg total)  by mouth 2 (two) times daily. 180 capsule 1   QUEtiapine (SEROQUEL) 200 MG tablet TAKE 1 TABLET BY MOUTH AT BEDTIME. 90 tablet 5   tamsulosin (FLOMAX) 0.4 MG CAPS capsule Take 1 capsule (0.4 mg total) by mouth daily. 90 capsule 3   tiZANidine (ZANAFLEX) 4 MG tablet TAKE 0.5-1 TABLETS (2-4 MG TOTAL) BY MOUTH EVERY 8 (EIGHT) HOURS AS NEEDED FOR MUSCLE SPASMS. 270 tablet 1   cyclobenzaprine (FLEXERIL) 5 MG tablet Take 1 tablet (5 mg total) by mouth 3 (three) times daily as needed for muscle spasms. Do not take within 6 hours of taking tizandine (Patient not taking:  Reported on 04/29/2021) 30 tablet 2   fluticasone (FLONASE) 50 MCG/ACT nasal spray SPRAY 2 SPRAYS INTO EACH NOSTRIL EVERY DAY (Patient not taking: No sig reported) 16 g 2   Fluticasone-Salmeterol (ADVAIR) 250-50 MCG/DOSE AEPB Inhale 1 puff into the lungs every 12 (twelve) hours. Rinse mouth after use (Patient not taking: No sig reported) 60 each 11    Musculoskeletal: Strength & Muscle Tone: decreased Gait & Station:  did not witness Patient leans: N/A  Psychiatric Specialty Exam: Physical Exam Vitals and nursing note reviewed.  Constitutional:      Appearance: Normal appearance.  HENT:     Head: Normocephalic.     Nose: Nose normal.  Pulmonary:     Effort: Pulmonary effort is normal.  Musculoskeletal:     Cervical back: Normal range of motion.  Neurological:     General: No focal deficit present.     Mental Status: She is alert and oriented to person, place, and time.  Psychiatric:        Attention and Perception: Attention and perception normal.        Mood and Affect: Mood is anxious and depressed.        Speech: Speech normal.        Behavior: Behavior normal. Behavior is cooperative.        Thought Content: Thought content normal.        Cognition and Memory: Cognition and memory normal.        Judgment: Judgment normal.    Review of Systems  Musculoskeletal:  Positive for back pain, joint pain and myalgias.  Psychiatric/Behavioral:  Positive for depression. The patient is nervous/anxious.   All other systems reviewed and are negative.  Blood pressure 110/72, pulse 78, temperature 98.5 F (36.9 C), temperature source Oral, resp. rate 18, height '4\' 11"'$  (1.499 m), weight 59.9 kg, SpO2 97 %.Body mass index is 26.67 kg/m.  General Appearance: Casual  Eye Contact:  Good  Speech:  Normal Rate  Volume:  Normal  Mood:  Anxious, Depressed, and Irritable  Affect:  Congruent  Thought Process:  Coherent and Descriptions of Associations: Intact  Orientation:  Full (Time, Place,  and Person)  Thought Content:  Delusions on admission regarding her pain patch  Suicidal Thoughts:  No  Homicidal Thoughts:  No  Memory:  Immediate;   Fair Recent;   Fair Remote;   Fair  Judgement:  Fair  Insight:  Fair  Psychomotor Activity:  Decreased  Concentration:  Concentration: Good and Attention Span: Good  Recall:  Good  Fund of Knowledge:  Good  Language:  Good  Akathisia:  No  Handed:  Right  AIMS (if indicated):     Assets:  Leisure Time Resilience Social Support  ADL's:  One person assist  Cognition:  WNL  Sleep:        Physical Exam: Physical  Exam Vitals and nursing note reviewed.  Constitutional:      Appearance: Normal appearance.  HENT:     Head: Normocephalic.     Nose: Nose normal.  Pulmonary:     Effort: Pulmonary effort is normal.  Musculoskeletal:     Cervical back: Normal range of motion.  Neurological:     General: No focal deficit present.     Mental Status: She is alert and oriented to person, place, and time.  Psychiatric:        Attention and Perception: Attention and perception normal.        Mood and Affect: Mood is anxious and depressed.        Speech: Speech normal.        Behavior: Behavior normal. Behavior is cooperative.        Thought Content: Thought content normal.        Cognition and Memory: Cognition and memory normal.        Judgment: Judgment normal.   Review of Systems  Musculoskeletal:  Positive for back pain, joint pain and myalgias.  Psychiatric/Behavioral:  Positive for depression. The patient is nervous/anxious.   All other systems reviewed and are negative. Blood pressure 110/72, pulse 78, temperature 98.5 F (36.9 C), temperature source Oral, resp. rate 18, height '4\' 11"'$  (1.499 m), weight 59.9 kg, SpO2 97 %. Body mass index is 26.67 kg/m.  Treatment Plan Summary: Daily contact with patient to assess and evaluate symptoms and progress in treatment, Medication management, and Plan : Major depressive disorder,  recurrent, moderate: -Continue Cymbalta 120 mg daily -Continue Haldol 1 mg BID  Anxiety: -Continue Buspar 25 mg BID  Insomnia: -Continue Seroquel 200 mg at bedtime  Disposition: Supportive therapy provided about ongoing stressors.  Waylan Boga, NP 05/01/2021 10:58 AM

## 2021-05-02 DIAGNOSIS — F331 Major depressive disorder, recurrent, moderate: Secondary | ICD-10-CM | POA: Diagnosis not present

## 2021-05-02 DIAGNOSIS — F22 Delusional disorders: Secondary | ICD-10-CM | POA: Diagnosis not present

## 2021-05-02 MED ORDER — FENTANYL 100 MCG/HR TD PT72
1.0000 | MEDICATED_PATCH | Freq: Once | TRANSDERMAL | Status: DC
  Administered 2021-05-02: 1 via TRANSDERMAL

## 2021-05-02 NOTE — ED Provider Notes (Signed)
-----------------------------------------   2:49 PM on 05/02/2021 -----------------------------------------  The patient has been evaluated by the psychiatry APP Reita Cliche and has been cleared for discharge from a psychiatric perspective.  The RN noted a couple of borderline low blood pressure readings this morning, however the patient has been lying in bed for the last few days and is likely somewhat deconditioned.  She was asleep for some of these readings.  She is tolerating p.o. and was given some water to drink.  On exam, her extremities appear well-perfused and she has no lightheadedness or near syncope.  Clinically she does not appear significantly hypovolemic.  On reassessment the patient states that she is having pain all over due to opiate withdrawal.  She states that her fentanyl patch was never changed.  I verified that she is ordered for fentanyl 100 mcg patches every 72 hours.  However based on the medication administration documentation it appears that the dose she was to be given on 9/16 was held so she in fact has not received it while in the ED.  I have ordered a dose of it to be given now.  The patient denies any other acute complaints.  She appears comfortable.  Lab work-up obtained while in the ED is unremarkable.  She is stable for discharge home.  Return precautions given, and she expresses understanding.    Arta Silence, MD 05/02/21 [phone]

## 2021-05-02 NOTE — ED Notes (Signed)
Patient's mother has requested that social work contact her after they have spoken with the patient.

## 2021-05-02 NOTE — ED Notes (Signed)
Pt asleep at this time, unable to collect vitals. Will collect pt vitals once awake. 

## 2021-05-02 NOTE — ED Notes (Signed)
Dinner tray given. No other needs found.

## 2021-05-02 NOTE — ED Provider Notes (Signed)
Emergency Medicine Observation Re-evaluation Note  Karen [name] is a 45 y.o. female, seen on rounds today.  Pt initially presented to the ED for complaints of Medication Refill Currently, the patient is calm and cooperative.  Physical Exam  BP 100/72 (BP Location: Left Arm)   Pulse 74   Temp 98.9 F (37.2 C) (Oral)   Resp 18   Ht '4\' 11"'$  (1.499 m)   Wt 59.9 kg   SpO2 96%   BMI 26.67 kg/m  Physical Exam  General: No apparent distress HEENT: moist mucous membranes CV: RRR Pulm: Normal WOB GI: soft and non tender MSK: no edema or cyanosis Neuro: face symmetric, moving all extremities  ED Course / MDM  EKG:   I have reviewed the labs performed to date as well as medications administered while in observation.  No acute changes overnight or new labs this morning  Plan  Current plan is for placement. Karen [name] is under involuntary commitment.      Alfred Levins, [place], MD 05/02/21 [phone]

## 2021-05-02 NOTE — ED Notes (Signed)
EDP made aware of pt's refusal to leave with mom.  Pt does not have a safe discharge plan at this time.

## 2021-05-02 NOTE — ED Notes (Signed)
EDP made aware of pt's BP.  RN to attempt to wake patient and have her drink water.

## 2021-05-02 NOTE — ED Notes (Signed)
Pt will remain in the ER.  SW consult has been placed.

## 2021-05-02 NOTE — ED Notes (Signed)
Pt not offered a snack at this time d/t pt being asleep.

## 2021-05-02 NOTE — ED Notes (Signed)
[facility] pending placement 

## 2021-05-02 NOTE — ED Notes (Signed)
Pt's mother called and stated she will be here to visit patient around 4 pm.

## 2021-05-02 NOTE — ED Provider Notes (Signed)
Patient cleared by psych but now refusing to go home with her mother.  States that she has a safe place to go but would not provide any additional history or confirmation of where she would be going.  Due to her disability mobility issues do not feel safe discharging her to the waiting room.  [place] a lot of this behavior seems very manipulative.  Unsure as to what her secondary gain or intention is and refusing to go home.  Will consult social work for possible placement   Merlyn Lot, MD 05/02/21 [id number]

## 2021-05-02 NOTE — ED Notes (Signed)
Pt wheeled to bathroom via wc and back to bed. Pt has no further needs at this time. Will continue to monitor.

## 2021-05-02 NOTE — ED Notes (Signed)
Pt's mother at bedside.  Pt aware she is being discharged, but does not want to leave with mom.  Pt states she has a safe place to go, but denies needing to call anyone and can not tell this writer how she will get there.  Pt's mother questioning this Probation officer if patient is safe to discharge.  RN assured mother the patient has been cleared by psychiatry.

## 2021-05-02 NOTE — ED Notes (Signed)
Pt assisted to the bathroom and back to bed via wc.

## 2021-05-02 NOTE — ED Notes (Signed)
VOL, pend placement 

## 2021-05-02 NOTE — ED Notes (Addendum)
EDP made aware of BP re check.  Pt has drank  cups of water.

## 2021-05-02 NOTE — Consult Note (Signed)
[facility] Psych ED Discharge  05/02/2021 12:16 PM Karen [name]  MRN:  [id number]  Method of visit?: Face to Face   Principal Problem: Major depressive disorder, recurrent episode, moderate ([place]) Discharge Diagnoses: Principal Problem:   Major depressive disorder, recurrent episode, moderate ([place]) Active Problems:   Tics of organic origin   Ehlers-Danlos syndrome   Migraine without aura   Fibromyalgia   Psychosis ([place])   Subjective: "I'm still having pain" (physical) which the EDP is addressing.  45 yo female presented to the ED as she thought her Fentanyl patch was not real and her pain was increased.  Her mother reports she tells her she hears things other people don't and "only tells me."  She has been in the ED for 3 days and not responding to internal stimuli while denying suicidal/homicidal ideations, hallucinations, and paranoia.  Her mother was contacted and she was agreeable for her to return home and has a specialized [name] to accommodate the client.  The client is her own guardian which she frequently points out to this provider and she will go to a "safe place" after discharge.  She is her own guardian and psychiatrically cleared for discharge.  The psychiatric team let her know that her mother is ready to come and get her.  Total Time spent with patient: 45 minutes  Past Psychiatric History: depression, anxiety  Past Medical History:  Past Medical History:  Diagnosis Date   Ehlers-Danlos syndrome    Fibromyalgia    Headache    Sleep apnea    Tics of organic origin    Transient alteration of awareness     Past Surgical History:  Procedure Laterality Date   COLONOSCOPY WITH PROPOFOL N/A 02/13/2018   Procedure: COLONOSCOPY WITH PROPOFOL;  Surgeon: [name], MD;  Location: [facility] ENDOSCOPY;  Service: Endoscopy;  Laterality: N/A;   DILATION AND CURETTAGE OF UTERUS  2009   ESOPHAGOGASTRODUODENOSCOPY (EGD) WITH PROPOFOL  02/13/2018   Procedure: ESOPHAGOGASTRODUODENOSCOPY  (EGD) WITH PROPOFOL;  Surgeon: [name], MD;  Location: [facility] ENDOSCOPY;  Service: Endoscopy;;   EYE SURGERY Left 1990   3 Surgeries on left eye to correct crossed eye   LAPAROSCOPY Left 2003   Fallopian Tube   [place]   3 Surgeries to repair broken arm   OTHER SURGICAL HISTORY Left    3 surgeries on her left thigh as an infant   OTHER SURGICAL HISTORY  1998   Jaw surgery   Right arm surgery  1987   x 3 due to fracture   TOENAIL EXCISION Bilateral 06/13/2019   Procedure: BILATERAL SECOND TOE PARTIAL NAIL ABLATION;  Surgeon: Erle Crocker, MD;  Location: [place];  Service: Orthopedics;  Laterality: Bilateral;  SURGERY REQUEST TIME 1 HOUR   TONSILLECTOMY  12/13/2002   Family History:  Family History  Problem Relation Age of Onset   Depression Mother    Stroke Mother    Fibromyalgia Mother    Osteoarthritis Mother    Asthma Brother    Depression Brother    Migraines Brother    Asperger's syndrome Brother    Other Brother        Ehlers-Danlos Syndrome   Cancer Maternal Aunt    Asperger's syndrome Maternal Aunt    Breast cancer Maternal Aunt    Heart disease Paternal Aunt    Heart disease Paternal Uncle    Cervical cancer Maternal Grandmother    Osteoporosis Maternal Grandmother  Died at 19   Osteoarthritis Maternal Grandmother    Colon cancer Maternal Grandfather    Asthma Maternal Grandfather    Asperger's syndrome Maternal Grandfather    Emphysema Maternal Grandfather        Died at 58   Prostate cancer Maternal Grandfather    Heart attack Paternal Grandfather        Died at 77   Asthma Paternal Grandfather    Tuberculosis Paternal Grandfather    Cancer Cousin    Asperger's syndrome Cousin    Asperger's syndrome Other    Heart Problems Paternal Grandmother        Died at 4   Ehlers-Danlos syndrome Father    Ehlers-Danlos syndrome Brother    Family Psychiatric  History: see  above Social History:  Social History   Substance and Sexual Activity  Alcohol Use No   Alcohol/week: 0.0 standard drinks     Social History   Substance and Sexual Activity  Drug Use No    Social History   Socioeconomic History   Marital status: Single    Spouse name: Not on file   Number of children: Not on file   Years of education: Not on file   Highest education level: Not on file  Occupational History   Not on file  Tobacco Use   Smoking status: Never   Smokeless tobacco: Never  Vaping Use   Vaping Use: Never used  Substance and Sexual Activity   Alcohol use: No    Alcohol/week: 0.0 standard drinks   Drug use: No   Sexual activity: Never  Other Topics Concern   Not on file  Social History Narrative   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    She lives with her mother.    She enjoys reading, watching TV, writing, and painting.    Social Determinants of Health   Financial Resource Strain: Not on file  Food Insecurity: Not on file  Transportation Needs: Not on file  Physical Activity: Not on file  Stress: Not on file  Social Connections: Not on file    Tobacco Cessation:  N/A, patient does not currently use tobacco products  Current Medications: Current Facility-Administered Medications  Medication Dose Route Frequency Provider Last Rate Last Admin   albuterol (VENTOLIN HFA) 108 (90 Base) MCG/ACT inhaler 2 puff  2 puff Inhalation Q6H PRN Duffy Bruce, MD       budesonide (PULMICORT) nebulizer solution 0.25 mg  0.25 mg Nebulization BID Duffy Bruce, MD   0.25 mg at 05/01/21 1138   busPIRone (BUSPAR) tablet 25 mg  25 mg Oral BID Patrecia Pour, NP   25 mg at 05/02/21 1130   celecoxib (CELEBREX) capsule 200 mg  200 mg Oral BID Duffy Bruce, MD   200 mg at 05/02/21 1023   DULoxetine (CYMBALTA) DR capsule 120 mg  120 mg Oral Daily Duffy Bruce, MD   120 mg at 05/02/21 1023   fentaNYL (DURAGESIC) 100 MCG/HR 1 patch  1 patch Transdermal Q72H [place],  Abby K, RPH       haloperidol (HALDOL) tablet 1 mg  1 mg Oral BID Duffy Bruce, MD   1 mg at 05/02/21 1024   pantoprazole (PROTONIX) EC tablet 40 mg  40 mg Oral Daily Duffy Bruce, MD   40 mg at 05/02/21 1023   pregabalin (LYRICA) capsule 300 mg  300 mg Oral BID Duffy Bruce, MD   300 mg at 05/02/21 1023   QUEtiapine (SEROQUEL) tablet 200 mg  200 mg Oral QHS Duffy Bruce, MD   200 mg at 05/01/21 2207   tamsulosin (FLOMAX) capsule 0.4 mg  0.4 mg Oral Daily Duffy Bruce, MD   0.4 mg at 05/02/21 1024   tiZANidine (ZANAFLEX) tablet 2-4 mg  2-4 mg Oral Q8H PRN Duffy Bruce, MD   4 mg at 05/01/21 1959   Current Outpatient Medications  Medication Sig Dispense Refill   albuterol (VENTOLIN HFA) 108 (90 Base) MCG/ACT inhaler Inhale 2 puffs into the lungs every 6 (six) hours as needed. 8 g 12   azelastine (OPTIVAR) 0.05 % ophthalmic solution Apply to eye as directed. 1 drop in each eye prn     busPIRone (BUSPAR) 10 MG tablet Take 1 tablet (10 mg total) by mouth 2 (two) times daily. Take along with '15mg'$  tablet twice a day 180 tablet 4   busPIRone (BUSPAR) 15 MG tablet TAKE 1 TABLET BY MOUTH 2 TIMES DAILY. 180 tablet 2   celecoxib (CELEBREX) 200 MG capsule TAKE 1 CAPSULE BY MOUTH TWICE A DAY 60 capsule 5   diclofenac Sodium (VOLTAREN) 1 % GEL APPLY AS NEEDED 3 TIMES A DAY 100 g 0   DULoxetine (CYMBALTA) 60 MG capsule TAKE 2 CAPSULES BY MOUTH EVERY DAY 180 capsule 3   fentaNYL (DURAGESIC) 100 MCG/HR Place 1 patch onto the skin every 3 (three) days. 10 patch 0   fexofenadine (ALLEGRA) 180 MG tablet Take 180 mg by mouth daily.     fluticasone (FLOVENT HFA) 110 MCG/ACT inhaler Inhale 2 puffs into the lungs 2 (two) times daily.     gabapentin (NEURONTIN) 600 MG tablet TAKE 2 TABLETS IN THE MORNING AND 3 TABLETS AT BEDTIME 155 tablet 5   haloperidol (HALDOL) 1 MG tablet Take 1 mg by mouth 2 (two) times daily. Patient states she should be and has been on 5 mg twice daily.     naloxone (NARCAN)  nasal spray 4 mg/0.1 mL Place 1 spray into the nose as needed.     omeprazole (PRILOSEC) 40 MG capsule TAKE 1 CAPSULE (40 MG TOTAL) BY MOUTH DAILY. 30 capsule 1   ondansetron (ZOFRAN-ODT) 4 MG disintegrating tablet DISSOLVE 1 TABLET IN MOUTH EVERY 8 HOURS FOR NAUSEA AND VOMITING 30 tablet 5   pregabalin (LYRICA) 300 MG capsule Take 1 capsule (300 mg total) by mouth 2 (two) times daily. 180 capsule 1   QUEtiapine (SEROQUEL) 200 MG tablet TAKE 1 TABLET BY MOUTH AT BEDTIME. 90 tablet 5   tamsulosin (FLOMAX) 0.4 MG CAPS capsule Take 1 capsule (0.4 mg total) by mouth daily. 90 capsule 3   tiZANidine (ZANAFLEX) 4 MG tablet TAKE 0.5-1 TABLETS (2-4 MG TOTAL) BY MOUTH EVERY 8 (EIGHT) HOURS AS NEEDED FOR MUSCLE SPASMS. 270 tablet 1   cyclobenzaprine (FLEXERIL) 5 MG tablet Take 1 tablet (5 mg total) by mouth 3 (three) times daily as needed for muscle spasms. Do not take within 6 hours of taking tizandine (Patient not taking: Reported on 04/29/2021) 30 tablet 2   fluticasone (FLONASE) 50 MCG/ACT nasal spray SPRAY 2 SPRAYS INTO EACH NOSTRIL EVERY DAY (Patient not taking: No sig reported) 16 g 2   Fluticasone-Salmeterol (ADVAIR) 250-50 MCG/DOSE AEPB Inhale 1 puff into the lungs every 12 (twelve) hours. Rinse mouth after use (Patient not taking: No sig reported) 60 each 11   PTA Medications: (Not in a hospital admission)   Musculoskeletal: Strength & Muscle Tone: decreased Gait & Station:  did not witness Patient leans: N/A  Psychiatric Specialty Exam: Physical Exam  Vitals and nursing note reviewed.  Constitutional:      Appearance: Normal appearance.  HENT:     Head: Normocephalic.     Nose: Nose normal.  Pulmonary:     Effort: Pulmonary effort is normal.  Neurological:     General: No focal deficit present.     Mental Status: She is alert and oriented to person, place, and time.  Psychiatric:        Attention and Perception: Attention and perception normal.        Mood and Affect: Affect normal.  Mood is anxious.        Speech: Speech normal.        Behavior: Behavior normal. Behavior is cooperative.        Thought Content: Thought content normal.        Cognition and Memory: Cognition and memory normal.        Judgment: Judgment normal.    Review of Systems  Psychiatric/Behavioral:  The patient is nervous/anxious.   All other systems reviewed and are negative.  Blood pressure 95/63, pulse 77, temperature 98.7 F (37.1 C), temperature source Oral, resp. rate 16, height '4\' 11"'$  (1.499 m), weight 59.9 kg, SpO2 98 %.Body mass index is 26.67 kg/m.  General Appearance: Casual  Eye Contact:  Good  Speech:  Normal Rate  Volume:  Normal  Mood:  Anxious at times, mild  Affect:  Congruent  Thought Process:  Coherent and Descriptions of Associations: Intact  Orientation:  Full (Time, Place, and Person)  Thought Content:  WDL and Logical  Suicidal Thoughts:  No  Homicidal Thoughts:  No  Memory:  Immediate;   Good Recent;   Good Remote;   Good  Judgement:  Fair  Insight:  Fair  Psychomotor Activity:  Decreased  Concentration:  Concentration: Good and Attention Span: Good  Recall:  Good  Fund of Knowledge:  Good  Language:  Good  Akathisia:  No  Handed:  Right  AIMS (if indicated):     Assets:  Housing Leisure Time Physical Health Resilience Social Support  ADL's:  Intact  Cognition:  WNL  Sleep:         Physical Exam: Physical Exam Vitals and nursing note reviewed.  Constitutional:      Appearance: Normal appearance.  HENT:     Head: Normocephalic.     Nose: Nose normal.  Pulmonary:     Effort: Pulmonary effort is normal.  Neurological:     General: No focal deficit present.     Mental Status: She is alert and oriented to person, place, and time.  Psychiatric:        Attention and Perception: Attention and perception normal.        Mood and Affect: Affect normal. Mood is anxious.        Speech: Speech normal.        Behavior: Behavior normal. Behavior is  cooperative.        Thought Content: Thought content normal.        Cognition and Memory: Cognition and memory normal.        Judgment: Judgment normal.   Review of Systems  Psychiatric/Behavioral:  The patient is nervous/anxious.   All other systems reviewed and are negative. Blood pressure 95/63, pulse 77, temperature 98.7 F (37.1 C), temperature source Oral, resp. rate 16, height '4\' 11"'$  (1.499 m), weight 59.9 kg, SpO2 98 %. Body mass index is 26.67 kg/m.   Demographic Factors:  NA  Loss Factors: NA  Historical Factors: NA  Risk Reduction Factors:   Sense of responsibility to family, Living with another person, especially a relative, Positive social support, and Positive therapeutic relationship  Continued Clinical Symptoms:  Anxiety, intermittent, mild  Cognitive Features That Contribute To Risk:  None    Suicide Risk:  Minimal: No identifiable suicidal ideation.  Patients presenting with no risk factors but with morbid ruminations; may be classified as minimal risk based on the severity of the depressive symptoms    Plan Of Care/Follow-up recommendations:  Anxiety: -Continue Buspar 25 mg BID -Follow up with outpatient provider  Depression: -Continue Cymbalta 120 mg daily  Insomnia: -Continue Seroquel 200 mg daily at bedtime  Paranoia/psychosis: -Continue Haldol 1 mg BID Activity:  as tolerated Diet:  heart healthy diet  Disposition: discharge home Waylan Boga, NP 05/02/2021, 12:16 PM

## 2021-05-02 NOTE — BH Assessment (Signed)
Writer spoke with the patient to complete an updated/reassessment. Patient denies SI/HI and AV/H. 

## 2021-05-02 NOTE — ED Notes (Signed)
Pt given a cup of water per request.  

## 2021-05-02 NOTE — Discharge Instructions (Addendum)
Follow up with your regular outpatient provider.  Return to the ER for new, worsening, or persistent severe depression, thoughts of wanting to hurt yourself or anyone else, or any other new or worsening symptoms that concern you.

## 2021-05-03 DIAGNOSIS — F22 Delusional disorders: Secondary | ICD-10-CM | POA: Diagnosis not present

## 2021-05-03 NOTE — ED Notes (Signed)
Pt given her breakfast tray, and a carton of milk upon request, pt's lights turned on upon request, cont to monitor

## 2021-05-03 NOTE — ED Notes (Signed)
Case management at the bedside to discuss plan of care with pt.

## 2021-05-03 NOTE — ED Notes (Signed)
Care management at bedside to discuss d/c planning with the pt, pt unwilling to discuss plans at this time

## 2021-05-03 NOTE — ED Notes (Signed)
Patient sleeping, vitals signs deferred until patient awake.

## 2021-05-03 NOTE — ED Notes (Signed)
Report received from [facility], pt resting quietly, eyes closed, resp even and unlabored, lights down for comfort cont to monitor

## 2021-05-03 NOTE — TOC Progression Note (Signed)
Transition of Care [facility]) - Progression Note    Patient Details  Name: Karen [name] MRN: [id number] Date of Birth: 1976-01-24  Transition of Care [facility]) CM/SW Contact  Anselm Pancoast, RN Phone Number: 05/03/2021, 4:30 PM  Clinical Narrative:    Returned to room to speak with patient regarding discharge plans and ways TOC could assist. Patient again refused to speak and looked away. RN CM updated patient that she had been discharged and would need to leave hospital or would be considered trespassing. Patient continued to refuse to speak.   RN CM spoke with mother who states patient has no learning disabilities and graduated from college with 2 different degrees. Patient was an advocate for disability ramps to be placed at her college. Mother reports patient had TBI in 1992 from a musical instrument falling on patients head and again in 1995 from a heavy bookbag falling on patient. No impairments noted in college accomplishments. Mother reports patient has never acted like this before until a few weeks ago. Previously the brother had to call and get the patient to leave the hospital. RN CM updated mother that patient was considered trespassing once discharge order is written and she refuses to leave. Patient is alert and oriented and able to make her own decisions. Mother is seeking legal aid in obtaining guardianship but currently does not. Patient lives with mother and has electric scooter she uses to transport.   Mother is planning to speak with patient and is going to attempt to outreach to brother to assist as well although brother currently is tending to a mentally ill spouse.    Expected Discharge Plan: Home/Self Care Barriers to Discharge: No Barriers Identified  Expected Discharge Plan and Services Expected Discharge Plan: Home/Self Care       Living arrangements for the past 2 months: Single Family Home                                       Social Determinants of  Health (SDOH) Interventions    Readmission Risk Interventions No flowsheet data found.

## 2021-05-03 NOTE — ED Notes (Signed)
Pt assisted to bathroom via w/c, pt able to pull herself to a standing position to put herself into w/c, pt required minimal assistance to pull her pants and underwear down to use the bathroom

## 2021-05-03 NOTE — TOC Initial Note (Signed)
Transition of Care [facility]) - Initial/Assessment Note    Patient Details  Name: Karen [name] MRN: [id number] Date of Birth: [birth year]  [facility]) CM/SW Contact:    Anselm Pancoast, RN Phone Number: 05/03/2021, 4:28 PM  Clinical Narrative:                 Attempted to speak with patient at bedside. Patient has been talking with Chaplain services, dietary and other staff however refusing to talk with nursing staff and once RN CM introduced role patient refused to talk. RN CM discussed role and offered to assist with discharge needs. Discussed patient had been discharged and needed to leave the hospital. Patient refused to respond. RN CM offered to come back and allow patient time to make decision. Patient agreed.   Expected Discharge Plan: Home/Self Care Barriers to Discharge: No Barriers Identified   Patient Goals and CMS Choice        Expected Discharge Plan and Services Expected Discharge Plan: Home/Self Care       Living arrangements for the past 2 months: Single Family Home                                      Prior Living Arrangements/Services Living arrangements for the past 2 months: Single Family Home Lives with:: Parents Patient language and need for interpreter reviewed:: Yes        Need for Family Participation in Patient Care: Yes (Comment) Care giver support system in place?: Yes (comment) Current home services: DME (electric scooter) Criminal Activity/Legal Involvement Pertinent to Current Situation/Hospitalization: No - Comment as needed  Activities of Daily Living      Permission Sought/Granted                  Emotional Assessment Appearance:: Appears stated age Attitude/Demeanor/Rapport: Avoidant Affect (typically observed): Inappropriate Orientation: : Oriented to Place, Oriented to Situation, Oriented to  Time, Oriented to Self Alcohol / Substance Use: Not Applicable Psych Involvement: Yes (comment)  Admission  diagnosis:  Med refill Patient Active Problem List   Diagnosis Date Noted   Major depressive disorder, recurrent episode, moderate ([place]) [id number]   Acute metabolic encephalopathy [id number]   Asthma 04/22/2021   Paranoid ([place]) 04/22/2021   Psychosis ([place]) 04/22/2021   UTI (urinary tract infection) 04/22/2021   Sepsis ([place]) 04/22/2021   Urinary retention 04/22/2021   Chronic tension type headache 06/04/2020   Breast lump on left side at 1 o'clock position 10/09/2019   Fibrocystic breast changes, bilateral 10/09/2019   Family history of colon cancer    Rectal polyp    Heartburn    Excessive somnolence disorder 06/28/2017   Seizures ([place]) 09/12/2016   Difficulty hearing 08/31/2015   Back pain, chronic 08/31/2015   Rheumatoid arthritis ([place]) 08/31/2015   Alopecia 03/10/2015   Anemia 03/10/2015   Arthritis 03/10/2015   Allergic asthma, mild persistent, uncomplicated [id number]   Chronic nausea 03/10/2015   DDD (degenerative disc disease), lumbar 03/10/2015   Depression with anxiety 03/10/2015   Personal history of MRSA (methicillin resistant Staphylococcus aureus) 03/10/2015   Insomnia 03/10/2015   Mitral valve prolapse 03/10/2015   Paresthesias/numbness 03/10/2015   Allergic rhinitis, seasonal 03/10/2015   Seizure disorder ([place]) 03/10/2015   Mixed sleep apnea, Central predominant 03/10/2015   Migraine without aura 01/27/2014   Fibromyalgia 01/27/2014   Transient alteration of awareness 01/07/2014   Tics of organic origin  01/07/2014   Backache, unspecified 01/07/2014   Ehlers-Danlos syndrome 01/07/2014   PCP:  Birdie Sons, MD Pharmacy[facility]   [name] [id number]: [phone]: [phone] [facility] [address] [place] [place]: [phone]: [phone] [id number] [place] [address]: [phone]: [phone]   Social Determinants of Health (SDOH) Interventions    Readmission Risk Interventions No flowsheet data found.

## 2021-05-03 NOTE — ED Notes (Signed)
Messaged pharm as buspar dose currently not available in ER.

## 2021-05-03 NOTE — ED Notes (Signed)
VOL  PENDING  PLACEMENT 

## 2021-05-03 NOTE — ED Notes (Signed)
Pt called out asking for pain medication for muscle spasm pain. Resting on stretcher. No dinner eaten.

## 2021-05-03 NOTE — ED Notes (Signed)
Dinner tray provided for pt 

## 2021-05-03 NOTE — ED Provider Notes (Signed)
Emergency Medicine Observation Re-evaluation Note  Karen [name] is a 45 y.o. female, seen on rounds today.  Pt initially presented to the ED for complaints of Medication Refill Currently, the patient is resting comfortably.  Physical Exam  BP 97/63 (BP Location: Left Arm)   Pulse 86   Temp 98.9 F (37.2 C) (Oral)   Resp 18   Ht '4\' 11"'$  (1.499 m)   Wt 59.9 kg   SpO2 97%   BMI 26.67 kg/m  Physical Exam Gen: No acute distress  Resp: Normal rise and fall of chest Neuro: Moving all four extremities Psych: Resting currently, calm and cooperative when awake    ED Course / MDM  EKG:   I have reviewed the labs performed to date as well as medications administered while in observation.  Recent changes in the last 24 hours include no acute events overnight.  Plan  Current plan is for social work disposition, cleared by psychiatry.  Karen [name] is not under involuntary commitment.     [place], Delice Bison, DO 05/03/21 [phone]

## 2021-05-03 NOTE — Progress Notes (Signed)
   05/03/21 0935  Clinical Encounter Type  Visited With Patient  Visit Type Initial;Spiritual support;Social support  Referral From Other (Comment) (rounding)  Chaplain Burris greeted Pt and offered information about pastoral support. Pt was very interested in learning about chaplain's role as a woman and we talked briefly about our shared denominational background and the role of women. Visit was interrupted by need to do breathing treatment; will return to continue visit later this morning.

## 2021-05-04 ENCOUNTER — Telehealth (INDEPENDENT_AMBULATORY_CARE_PROVIDER_SITE_OTHER): Payer: Self-pay | Admitting: Pediatrics

## 2021-05-04 DIAGNOSIS — F22 Delusional disorders: Secondary | ICD-10-CM | POA: Diagnosis not present

## 2021-05-04 LAB — POC URINE PREG, ED: Preg Test, Ur: NEGATIVE

## 2021-05-04 NOTE — ED Notes (Signed)
Lunch placed on end of bed.

## 2021-05-04 NOTE — ED Notes (Signed)
Dinner tray provided to pt. Informed pt that she had been discharged and that she could call her mother or brother for a ride home. Pt states that she had not spoken to the doctor about being discharged. Pt states "no, thank you" when offered the phone to call transportation.

## 2021-05-04 NOTE — ED Notes (Signed)
Pt refusing to get into w/c for discharge and leave.  This RN asked pt why did not want to leave, pt blatantly refusing to speak to RN

## 2021-05-04 NOTE — Telephone Encounter (Signed)
  Who's calling (name and relationship to patient) : Mom  [place] contact number: [phone]   Provider they see: Dr. Gaynell Face  Reason for call: Mom stated that patient is in the psyc in ER and she patient is refusing to go home, she is refusing to talk to anyone. She is hullucianating. Patient is parinoid and is hiding Parent is asking if dr. Gaynell Face could speak to her directly.  Parent is down at the magistrate office ready to involuntary commit her. Hospital stated the are going to eventually discharge her to a homeless shelter.      PRESCRIPTION REFILL ONLY  Name of prescription:  Pharmacy:

## 2021-05-04 NOTE — ED Notes (Signed)
Pt lying in bed with eyes closed, resps even and unlabored; easily aroused when name called. Pt states that she is feeling "okay". Pt c/o pain "everywhere", which she rates 8/10 on 0-10 pain scale. Pt denies SI/HI/AVH at this time. Pt states that she slept "okay" last night and describes her appetite as "good". Pt denies anxiety and depression at this time. Medications administered; pt refused nebulizer treatment at this time. No acute distress noted.

## 2021-05-04 NOTE — ED Notes (Signed)
Assumed care; pt report received from [place], [place].

## 2021-05-04 NOTE — ED Notes (Signed)
Called pt's mother to inform her that pt is being discharged and will require transportation home. Pt's mother stated that she is at [facility] to use restroom and will come to hospital afterwards to pick up patient.

## 2021-05-04 NOTE — ED Notes (Signed)
Pt transported outside to mother via w/c with security. Pt assisted to motorized w/c. Pt using motorized w/c to try and go away from mother and vehicle to go home.  Pt refusing to verbalize why does not want to go home or does not want discharge. Pt only stated that she was her own legal guardian and she did not sign discharge paperwork. This RN explained to pt that she was medically and psychiatrically examined/cleared and discharged by providers.  Pt mother agreeing to take pt home, states "no questions" because our assessments our worth 10 cents and her assessments of pt are better.  Pt into [place] with mother via motorized wheelchair.  Charge RN [place] aware of pt situation.

## 2021-05-04 NOTE — ED Provider Notes (Signed)
Emergency Medicine Observation Re-evaluation Note  Karen [name] is a 45 y.o. female, seen on rounds today.  Pt initially presented to the ED for complaints of Medication Refill Currently, the patient is calm and cooperative.  Physical Exam  BP 99/62 (BP Location: Left Arm)   Pulse 71   Temp 98.4 F (36.9 C) (Oral)   Resp 14   Ht 4\' 11"  (1.499 m)   Wt 59.9 kg   SpO2 97%   BMI 26.67 kg/m  Physical Exam  General: No apparent distress HEENT: moist mucous membranes CV: RRR Pulm: Normal WOB GI: soft and non tender MSK: no edema or cyanosis Neuro: face symmetric, moving all extremities  ED Course / MDM  EKG:   I have reviewed the labs performed to date as well as medications administered while in observation.  No acute changes overnight or new labs this morning  Plan  Current plan is for placement.  Karen [name] is not under involuntary commitment.     Rudene Re, MD 05/04/21 [phone]

## 2021-05-04 NOTE — Telephone Encounter (Signed)
At mother's request I talked to the patient.  She has refused to be discharged from [facility] ED where she has been for 133 hours.  She has had numerous psychiatric evaluations and they have concluded that she does not have psychosis.  She says that she is not hearing voices.  Her mother claims that she is hearing voices and is acting in a paranoid manner at home.  She had a very difficult experience at a psychiatric hospital a week ago.  She is back in the ED and is refusing to leave.  She tells me that she is in pain.  She also tells me that she is not getting her medications as prescribed.  I told her that if she went home she would escape the noise the uncomfortable bed and be able to get her medications as prescribed and feels somewhat better.  I told her mother to say the same thing.  I hope that will help persuade her that she needs to leave the hospital and go home.  I told mother that I am not equipped to be able to deal with the psychiatric disorder.  She needs to see a psychiatrist.  Mother talked about involuntary commitment if that happens it will only be for 72 hours and she will be right back in the same place that she is now.  I do not think she is going to be able to get guardianship I explained to mother the process by which guardianship would be obtained.  I called mother back and she thanked me for my efforts.

## 2021-05-04 NOTE — ED Notes (Signed)
Pt refuses assistance to get out of bed into wheelchair, as she has been discharged. Security called for assistance.

## 2021-05-04 NOTE — ED Notes (Signed)
Pt left ED via wheelchair with RN staff; pt's mother in lobby.

## 2021-05-04 NOTE — ED Notes (Signed)
VOL, pending discharge

## 2021-05-04 NOTE — ED Notes (Signed)
Assisted pt to change into new mesh underwear and given new peri pad as requested.

## 2021-05-04 NOTE — TOC Progression Note (Addendum)
Transition of Care [facility]) - Progression Note    Patient Details  Name: Karen [name] MRN: [id number] Date of Birth: 03/30/1976  Transition of Care [facility]) CM/SW [place], [place] Phone Number: [phone] 05/04/2021, 4:03 PM  Clinical Narrative:     CSW spoke with patient's mother Tyeshia Cornforth [phone], who stated she disagrees with the assessment the patient does not meet inpatient psychiatric criteria.  Ms. Branagan stated the patient needs inpatient care.  CSW asked Ms. Mcelwee if patient was able to make decisions for herself.  Ms. Burdo stated the patient does not have a guardian and makes decisions for herself but does not think the patient is capable to making decision for herself at the moment.  CSW stated because the patient does not meet inpatient psych criteria or medical inpatient criteria she will need to return home.  Ms. Guilbault stated the patient is welcome to return home but will probably refuse to do so.  CSW spoke with the patient.  CSW introduced herself and then asked the patient if she was her own guardian and made decisions for herself.  The patient stated she did.  CSW stated the patient no longer met inpatient psychiatric criteria and had refused inpatient psychiatric placement.  CSW stated the patient has a safe discharge to home and if the patient refused to go home, I could arrange for transportation to a homeless shelter. CSW asked which she referred, patient refused to answer but glanced at [place].  CSW stated if I arranged transportation and patient had a safe discharge if patient refused to leave the premises, the authorities could be called for trespassing.  The patient said "no" but did not elaborate, when CSW asked what she was saying no to. CSW asked the patient, if my choices were not what she was agreeable to, what would she like to do?  Patient glanced at [place] refused to answer.  CSW told patient that I would be happy to advocate on her behalf  but I could not do that unless I knew what she wanted, and if she told me then we could have a discussion about it.  Patient glanced at [place], refused to answer.  CSW stated because patient is her own guardian and is refusing to participate in discharge plan, CSW would consider the patient's silence as consent to the discharge plan I presented at the beginning of the conversation.  Patient glanced at [place], refused to answer.  CSW updated EDP on discharge plan.  CSW spoke with [facility] Supervisor about conversation with patient and discharged plan.  Because the patient will likely refuse to leave premises (she has done this in the past) once she is ready for transportation.  CSW updated EDP.  Expected Discharge Plan: Home/Self Care Barriers to Discharge: No Barriers Identified  Expected Discharge Plan and Services Expected Discharge Plan: Home/Self Care       Living arrangements for the past 2 months: Single Family Home                                       Social Determinants of Health (SDOH) Interventions    Readmission Risk Interventions No flowsheet data found.

## 2021-05-05 ENCOUNTER — Ambulatory Visit: Payer: Medicare Other | Admitting: Obstetrics & Gynecology

## 2021-05-06 NOTE — Telephone Encounter (Signed)
Mom has called back - states that patient is in the hospital but is not the person she knows. She is looking to speak with Dr. Gaynell Face for some advice. Call back number is [phone]

## 2021-05-07 NOTE — Telephone Encounter (Signed)
I don't know if we are going to be able to successfully refer her.

## 2021-05-07 NOTE — Telephone Encounter (Signed)
I am not available to return phone messages because I am not in the office until next Wednesday.  This needs to be triaged either to Dr. Rogers Blocker or [name].

## 2021-05-07 NOTE — Telephone Encounter (Signed)
I called mother back, confirmed that Marvalene is now home.   Mother reports she has been diagnosed with "acute metabolic encephalopathy" due to a UTI, which has now been treated. I reassured mother that if this is the case, it will improve over time. Discussed that discharge is based on safety, not necessarily if she is back to herself.  Mother is going to watch her closely, but agrees she is safe. I encouraged mother to follow-up with Kaila's PCP early next week to ensure she is improving.   Mother had decided that if she has "catononic seizure" again, she is not going to bring her to the hospital.  We discussed seizure first aid and it's ok to stay home if she is breathing and hasn't hurt herself.   Mother also says Liv needs a new neurologist.  The plan was to go to Dr Tomi Likens, but he declined.  I let mother know I would discuss this with Dr Gaynell Face and he will guide who he feels is best for her.  Mother in agreement.    Carylon Perches MD MPH

## 2021-05-08 ENCOUNTER — Other Ambulatory Visit: Payer: Self-pay | Admitting: Family Medicine

## 2021-05-10 ENCOUNTER — Telehealth (INDEPENDENT_AMBULATORY_CARE_PROVIDER_SITE_OTHER): Payer: Self-pay | Admitting: Pediatrics

## 2021-05-10 NOTE — Telephone Encounter (Signed)
  Who's calling (name and relationship to patient) : mom Karen [name]   Best contact [phone]  Provider they see:Dr. Hickling/ DR. Rogers Blocker  Reason for call: Mom wants to know the status of the nuerologist specialist referral     PRESCRIPTION REFILL ONLY  Name of prescription:  Pharmacy:

## 2021-05-10 NOTE — Telephone Encounter (Signed)
We have not been able to find a neurologist who has agreed to accept her in transfer.  I have not been able to talk with my colleagues about this.  Anything said to mother about this topic needs to come from me, and will not happen until I return to the office.

## 2021-05-11 ENCOUNTER — Telehealth: Payer: Self-pay

## 2021-05-11 NOTE — Telephone Encounter (Signed)
Copied from [place] [id number]. Topic: General - Inquiry >> May 11, 2021  3:22 PM Oneta Rack wrote: [place] Human name: Alfredia Ferguson  Relation to pt: from [place]  Call back number: [phone] fax # [phone]    Reason for call: Received orders for power wheel chair and referral for PT / OT EVAL. Caller states dx codes are missing, requesting a follow up call and orders faxed back with dx codes.

## 2021-05-12 DIAGNOSIS — J309 Allergic rhinitis, unspecified: Secondary | ICD-10-CM | POA: Diagnosis not present

## 2021-05-12 DIAGNOSIS — Z6827 Body mass index (BMI) 27.0-27.9, adult: Secondary | ICD-10-CM | POA: Diagnosis not present

## 2021-05-12 DIAGNOSIS — J329 Chronic sinusitis, unspecified: Secondary | ICD-10-CM | POA: Diagnosis not present

## 2021-05-12 NOTE — Telephone Encounter (Signed)
I have spoken with [name] and she has agreed to take Arthella on as a provider with a limited role of dealing with tics.  We are not going to provide care for her psychiatric conditions or her chronic pain syndrome with the exception of gabapentin.  I informed mother this today by phone.  I recommended that she return in 6 months after her June visit which would be around December.  I told mother to call for an appointment with [name].

## 2021-05-16 ENCOUNTER — Other Ambulatory Visit: Payer: Self-pay | Admitting: Family Medicine

## 2021-05-16 NOTE — Telephone Encounter (Signed)
Requested medication (s) are due for refill today: no  Requested medication (s) are on the active medication list: yes  Last refill:  12/30/20 #180 1 RF  Future visit scheduled: no  Notes to clinic:  med not delegated to NT to RF   Requested Prescriptions  Pending Prescriptions Disp Refills   pregabalin (LYRICA) 300 MG capsule [Pharmacy Med Name: PREGABALIN 300 MG CAPSULE] 180 capsule 1    Sig: TAKE 1 CAPSULE BY MOUTH TWICE A DAY     Not Delegated - Neurology:  Anticonvulsants - Controlled Failed - 05/16/2021  2:55 PM      Failed - This refill cannot be delegated      Passed - Valid encounter within last 12 months    Recent Outpatient Visits           1 month ago [place], Donald E, MD   5 months ago Ehlers-Danlos syndrome   [facility] Birdie Sons, MD   1 year ago Sinusitis, unspecified chronicity, unspecified location   [facility] Birdie Sons, MD   1 year ago Rheumatoid arthritis, involving unspecified site, unspecified whether rheumatoid factor present [facility])   [facility] Birdie Sons, MD   1 year ago Breast lump on left side at 1 o'clock position   [place], Kelby Aline, FNP       Future Appointments             In 9 months MacDiarmid, Nicki Reaper, [place]

## 2021-05-18 ENCOUNTER — Ambulatory Visit: Payer: Self-pay | Admitting: *Deleted

## 2021-05-18 DIAGNOSIS — F23 Brief psychotic disorder: Secondary | ICD-10-CM

## 2021-05-18 NOTE — Telephone Encounter (Signed)
Please review.  Is it okay to take one of the acute appointments?  Thanks,   -Mickel Baas

## 2021-05-18 NOTE — Telephone Encounter (Signed)
Mother is calling to report that patient was diagnosed end of August: encephalopathy from undiagnosed UTI.  Patient has had a drastic change in her mental state- she hears things and has hallucinations.  Mother states since she has been back home she has stated she is waiting for her brother to come and get her so she can marry someone she has not seen in 63 years. Patient called police 3 times in one night because she thought there were intruders.Mother has taken the phone away from patient. Mother is concerned because the patient believes she is going to die from round worms in the brain- patient is requesting testing from round worms.  Mother is concerned about patient's mental state and is concerned because it changed so quickly. Patient's mother thinks that this testing would help calm her.  Patient needs hospital follow up appointment with PCP for evaluation of changes and mother is requesting psychiatry referral also.  Mother is concerned about patient's privacy and ask that this message be directed to PCP/nurse.  Note sent to office with mother's concerns- also she states the pharmacy did not get Lyrica Rx- but I did call them- it is too soon for refill- Rx 8/15 for #180- so they will not fill until 11/15. Reason for Disposition . Requesting regular office appointment  Protocols used: Information Only Call - No Triage-A-AH

## 2021-05-19 ENCOUNTER — Telehealth: Payer: Self-pay

## 2021-05-19 NOTE — Addendum Note (Signed)
Addended by: Birdie Sons on: 05/19/2021 09:20 AM   Modules accepted: Orders

## 2021-05-19 NOTE — Telephone Encounter (Signed)
Copied from [place] [id number]. Topic: General - Other >> May 18, 2021 11:21 AM Tessa Lerner A wrote: Reason for CRM: The patient's mother has made a call regarding their prescription for pregabalin (LYRICA) 300 MG capsule [[id number]]   The patient's mother would like to speak with a member of clinical staff when possible to address further why the refill was not delegated  Please contact when available >> May 18, 2021  1:37 PM Pawlus, Brayton Layman A wrote: Pts mother was advised the Lyrica was sent in yesterday, caller stated she still has not been able to pick it up, pt wanted to talk to Dr Caryn Section or his nurse and would not give any further details.

## 2021-05-25 ENCOUNTER — Other Ambulatory Visit: Payer: Self-pay

## 2021-05-25 ENCOUNTER — Encounter: Payer: Self-pay | Admitting: Gastroenterology

## 2021-05-25 ENCOUNTER — Ambulatory Visit (INDEPENDENT_AMBULATORY_CARE_PROVIDER_SITE_OTHER): Payer: Medicare Other | Admitting: Gastroenterology

## 2021-05-25 VITALS — BP 103/72 | HR 103 | Ht 59.0 in | Wt 125.0 lb

## 2021-05-25 DIAGNOSIS — K909 Intestinal malabsorption, unspecified: Secondary | ICD-10-CM | POA: Diagnosis not present

## 2021-05-25 DIAGNOSIS — R197 Diarrhea, unspecified: Secondary | ICD-10-CM | POA: Diagnosis not present

## 2021-05-25 DIAGNOSIS — R1084 Generalized abdominal pain: Secondary | ICD-10-CM | POA: Diagnosis not present

## 2021-05-25 MED ORDER — CLENPIQ 10-3.5-12 MG-GM -GM/160ML PO SOLN: 320.0000 mL | ORAL | 0 refills | Status: DC

## 2021-05-25 MED ORDER — OMEPRAZOLE 40 MG PO CPDR: 40.0000 mg | DELAYED_RELEASE_CAPSULE | Freq: Two times a day (BID) | ORAL | 6 refills | Status: DC

## 2021-05-25 NOTE — Progress Notes (Signed)
Gastroenterology Consultation  Referring Provider:     Birdie Sons, MD Primary Care Physician:  Birdie Sons, MD Primary Gastroenterologist:  Dr. Allen Norris     Reason for Consultation:     History of colon polyps and abdominal pain        HPI:   Karen [name] is a 45 y.o. y/o female referred for consultation & management of History of colon polyps and abdominal pain by Dr. Caryn Section, Kirstie Peri, MD.  This patient comes in today with a history of having Ehlers-Danlos syndrome and fibromyalgia and a colonoscopy by me in 2019.  The patient has a history of adenomatous polyps and states that she has been losing weight without trying.  The patient is concerned that she may have a parasite as a cause for her weight loss but denies any diarrhea.    There is no report of any black stools or bloody stools. The patient's abdominal pain is in the lower abdomen and worse when she bends over or leans forward. The patient also reports that she has been having heartburn and is not being helped by her omeprazole once a day.  She does report that if she takes it twice a day she feels much better.  Past Medical History:  Diagnosis Date   Ehlers-Danlos syndrome    Fibromyalgia    Headache    Sleep apnea    Tics of organic origin    Transient alteration of awareness     Past Surgical History:  Procedure Laterality Date   COLONOSCOPY WITH PROPOFOL N/A 02/13/2018   Procedure: COLONOSCOPY WITH PROPOFOL;  Surgeon: [name], MD;  Location: [facility] ENDOSCOPY;  Service: Endoscopy;  Laterality: N/A;   DILATION AND CURETTAGE OF UTERUS  2009   ESOPHAGOGASTRODUODENOSCOPY (EGD) WITH PROPOFOL  02/13/2018   Procedure: ESOPHAGOGASTRODUODENOSCOPY (EGD) WITH PROPOFOL;  Surgeon: [name], MD;  Location: [place] ENDOSCOPY;  Service: Endoscopy;;   EYE SURGERY Left 1990   3 Surgeries on left eye to correct crossed eye   LAPAROSCOPY Left 2003   Fallopian Tube   [place]   3 Surgeries to repair broken arm   OTHER SURGICAL HISTORY Left    3 surgeries on her left thigh as an infant   OTHER SURGICAL HISTORY  1998   Jaw surgery   Right arm surgery  1987   x 3 due to fracture   TOENAIL EXCISION Bilateral 06/13/2019   Procedure: BILATERAL SECOND TOE PARTIAL NAIL ABLATION;  Surgeon: Erle Crocker, MD;  Location: [place];  Service: Orthopedics;  Laterality: Bilateral;  SURGERY REQUEST TIME 1 HOUR   TONSILLECTOMY  12/13/2002    Prior to Admission medications   Medication Sig Start Date End Date Taking? Authorizing Provider  albuterol (VENTOLIN HFA) 108 (90 Base) MCG/ACT inhaler Inhale 2 puffs into the lungs every 6 (six) hours as needed. 06/27/19  Yes Young, Tarri Fuller D, MD  azelastine (OPTIVAR) 0.05 % ophthalmic solution Apply to eye as directed. 1 drop in each eye prn 02/13/20  Yes [provider]  busPIRone (BUSPAR) 10 MG tablet Take 1 tablet (10 mg total) by mouth 2 (two) times daily. Take along with 15mg  tablet twice a day 03/23/21  Yes Birdie Sons, MD  busPIRone (BUSPAR) 15 MG tablet TAKE 1 TABLET BY MOUTH 2 TIMES DAILY. 12/30/20  Yes Birdie Sons, MD  celecoxib (CELEBREX) 200 MG capsule TAKE 1 CAPSULE BY MOUTH TWICE  A DAY 03/01/21  Yes Birdie Sons, MD  diclofenac Sodium (VOLTAREN) 1 % GEL APPLY AS NEEDED 3 TIMES A DAY 11/16/20  Yes Birdie Sons, MD  DULoxetine (CYMBALTA) 60 MG capsule TAKE 2 CAPSULES BY MOUTH EVERY DAY 02/03/21  Yes Birdie Sons, MD  fentaNYL (DURAGESIC) 100 MCG/HR Place 1 patch onto the skin every 3 (three) days. 04/27/21  Yes Birdie Sons, MD  fexofenadine (ALLEGRA) 180 MG tablet Take 180 mg by mouth daily.   Yes [provider]  fluticasone (FLONASE) 50 MCG/ACT nasal spray 2 sprays into each nostril Two (2) times a day. 2 sprays each nostril BID. DISP# 2 bottles = 1 month supply 05/12/21  Yes [provider]  fluticasone (FLOVENT HFA) 110 MCG/ACT inhaler Inhale 2 puffs  into the lungs 2 (two) times daily. 04/25/21  Yes [provider]  gabapentin (NEURONTIN) 600 MG tablet TAKE 2 TABLETS IN THE MORNING AND 3 TABLETS AT BEDTIME 01/15/21  Yes Hickling, Princess Bruins, MD  haloperidol (HALDOL) 0.5 MG tablet Take 0.25 mg by mouth 2 (two) times daily. 05/22/21  Yes [provider]  haloperidol (HALDOL) 1 MG tablet Take 1 mg by mouth 2 (two) times daily. Patient states she should be and has been on 5 mg twice daily. 04/15/21  Yes [provider]  naloxone (NARCAN) nasal spray 4 mg/0.1 mL Place 1 spray into the nose as needed. 04/15/21  Yes [provider]  omeprazole (PRILOSEC) 40 MG capsule TAKE 1 CAPSULE (40 MG TOTAL) BY MOUTH DAILY. 05/09/21  Yes Birdie Sons, MD  ondansetron (ZOFRAN-ODT) 4 MG disintegrating tablet DISSOLVE 1 TABLET IN MOUTH EVERY 8 HOURS FOR NAUSEA AND VOMITING 09/29/20  Yes Birdie Sons, MD  pregabalin (LYRICA) 300 MG capsule TAKE 1 CAPSULE BY MOUTH TWICE A DAY 05/17/21  Yes Birdie Sons, MD  QUEtiapine (SEROQUEL) 200 MG tablet TAKE 1 TABLET BY MOUTH AT BEDTIME. 12/30/20  Yes Birdie Sons, MD  tamsulosin (FLOMAX) 0.4 MG CAPS capsule Take 1 capsule (0.4 mg total) by mouth daily. 02/22/21  Yes MacDiarmid, Nicki Reaper, MD  tiZANidine (ZANAFLEX) 4 MG tablet TAKE 0.5-1 TABLETS (2-4 MG TOTAL) BY MOUTH EVERY 8 (EIGHT) HOURS AS NEEDED FOR MUSCLE SPASMS. 04/14/21  Yes Birdie Sons, MD  cyclobenzaprine (FLEXERIL) 5 MG tablet Take 1 tablet (5 mg total) by mouth 3 (three) times daily as needed for muscle spasms. Do not take within 6 hours of taking tizandine Patient not taking: No sig reported 03/26/21   Birdie Sons, MD  Fluticasone-Salmeterol (ADVAIR) 250-50 MCG/DOSE AEPB Inhale 1 puff into the lungs every 12 (twelve) hours. Rinse mouth after use Patient not taking: No sig reported 04/02/20   Deneise Lever, MD    Family History  Problem Relation Age of Onset   Depression Mother    Stroke Mother    Fibromyalgia Mother     Osteoarthritis Mother    Asthma Brother    Depression Brother    Migraines Brother    Asperger's syndrome Brother    Other Brother        Ehlers-Danlos Syndrome   Cancer Maternal Aunt    Asperger's syndrome Maternal Aunt    Breast cancer Maternal Aunt    Heart disease Paternal Aunt    Heart disease Paternal Uncle    Cervical cancer Maternal Grandmother    Osteoporosis Maternal Grandmother        Died at 30   Osteoarthritis Maternal Grandmother    Colon  cancer Maternal Grandfather    Asthma Maternal Grandfather    Asperger's syndrome Maternal Grandfather    Emphysema Maternal Grandfather        Died at 34   Prostate cancer Maternal Grandfather    Heart attack Paternal Grandfather        Died at 60   Asthma Paternal Grandfather    Tuberculosis Paternal Grandfather    Cancer Cousin    Asperger's syndrome Cousin    Asperger's syndrome Other    Heart Problems Paternal Grandmother        Died at 82   Ehlers-Danlos syndrome Father    Ehlers-Danlos syndrome Brother      Social History   Tobacco Use   Smoking status: Never   Smokeless tobacco: Never  Vaping Use   Vaping Use: Never used  Substance Use Topics   Alcohol use: No    Alcohol/week: 0.0 standard drinks   Drug use: No    Allergies as of 05/25/2021 - Review Complete 05/25/2021  Allergen Reaction Noted   Augmentin  [amoxicillin-pot clavulanate] Diarrhea 03/10/2015   Chocolate flavor  03/18/2014   Demerol [meperidine] Other (See Comments) 01/27/2014   Keflex [cephalexin] Nausea And Vomiting 01/27/2014   Morphine and related Nausea And Vomiting 01/27/2014   Tape Dermatitis 07/29/2015   Toradol [ketorolac tromethamine] Nausea And Vomiting 01/27/2014   Amoxicillin  04/01/2015   Chocolate  03/10/2015   Nsaids  03/10/2015   Strawberry extract  03/10/2015    Review of Systems:    All systems reviewed and negative except where noted in HPI.   Physical Exam:  BP 103/72 (BP Location: Left Arm, Patient Position:  Sitting, Cuff Size: Normal)   Pulse (!) 103   Ht 4\' 11"  (1.499 m)   Wt 125 lb (56.7 kg)   BMI 25.25 kg/m  No LMP recorded. Patient is postmenopausal. General:   Alert,  Well-developed, well-nourished, pleasant and cooperative in NAD Head:  Normocephalic and atraumatic. Eyes:  Sclera clear, no icterus.   Conjunctiva pink. Ears:  Normal auditory acuity. Neck:  Supple; no masses or thyromegaly. Lungs:  Respirations even and unlabored.  Clear throughout to auscultation.   No wheezes, crackles, or rhonchi. No acute distress. Heart:  Regular rate and rhythm; no murmurs, clicks, rubs, or gallops. Abdomen:  Normal bowel sounds.  No bruits.  Soft, non-tender and non-distended without masses, hepatosplenomegaly or hernias noted.  No guarding or rebound tenderness.  Negative Carnett sign.   Rectal:  Deferred.  Pulses:  Normal pulses noted. Extremities:  No clubbing or edema.  No cyanosis. Neurologic:  Alert and oriented x3;  Wheelchair-bound. Skin:  Intact without significant lesions or rashes.  No jaundice. Lymph Nodes:  No significant cervical adenopathy. Psych:  Alert and cooperative. Normal mood and affect.  Imaging Studies: CT HEAD WO CONTRAST (5MM)  Result Date: 04/27/2021 CLINICAL DATA:  Mental status change, unknown cause EXAM: CT HEAD WITHOUT CONTRAST TECHNIQUE: Contiguous axial images were obtained from the base of the skull through the vertex without intravenous contrast. COMPARISON:  04/22/2021. FINDINGS: Brain: No evidence of acute large vascular territory infarction, hemorrhage, hydrocephalus, extra-axial collection or mass lesion/mass effect. Vascular: No hyperdense vessel identified. Skull: No acute fracture. Sinuses/Orbits: Mild paranasal sinus mucosal thickening with frothy secretions in the left sphenoid sinus. No acute orbital findings. Other: No mastoid effusions. IMPRESSION: 1. No evidence of acute intracranial abnormality. 2. Mild paranasal sinus inflammation, similar.  Electronically Signed   By: Margaretha Sheffield M.D.   On: 04/27/2021 17:16  DG Chest Portable 1 View  Result Date: 04/27/2021 CLINICAL DATA:  Altered mental status. EXAM: PORTABLE CHEST 1 VIEW COMPARISON:  April 22, 2021. FINDINGS: The heart size and mediastinal contours are within normal limits. Both lungs are clear. The visualized skeletal structures are unremarkable. IMPRESSION: No active disease. Electronically Signed   By: Marijo Conception M.D.   On: 04/27/2021 17:01    Assessment and Plan:   ARDENA GANGL is a 45 y.o. y/o female Who comes in today with a history of colon polyps and a family history of colon/anal cancer.  The patient had a colonoscopy in 2019 and had a called her primary care provider stating that she was due for her repeat colonoscopy and was sent to me for evaluation.  The patient also reports unexplained weight loss and Is concerned that she may have a parasite.  The patient will have a stool  sent off for Ova and parasites.  She will also be set up for colonoscopy due to her history of colon polyps and family history.  The patient will also increase her omeprazole to twice a day instead of once a day since that seems to be helping her symptoms.The patient has been explained the plan and agrees with it.[name], MD. Marval Regal    Note: This dictation was prepared with Dragon dictation along with smaller phrase technology. Any transcriptional errors that result from this process are unintentional.

## 2021-05-26 ENCOUNTER — Other Ambulatory Visit: Payer: Self-pay

## 2021-05-26 ENCOUNTER — Telehealth: Payer: Self-pay

## 2021-05-26 DIAGNOSIS — R634 Abnormal weight loss: Secondary | ICD-10-CM

## 2021-05-26 NOTE — Telephone Encounter (Signed)
Copied from [place] [phone]. Topic: General - Other >> May 26, 2021  3:42 PM Loma Boston wrote: Reason for CRM: Pt mother Lauree Chandler is calling, Pt has been hospitalized for a few weeks. She is home was diagnosed with a UTI and Encephalitis. Mother states that she thinks that the UTI is back. She wants some test, example... antibodies to see if was caused by having covid, etc and she wants a urinalysis as soon as possible as states daughter is not feeling well. There is scheduling issues but she is willing for a PA or anyone to order some test so that she will have the opportuity to have her PCP aware of what is taking place. Please return the call to [phone]

## 2021-05-26 NOTE — Telephone Encounter (Signed)
Please review. Dr. Caryn Section is out of the office this afternoon.

## 2021-05-27 NOTE — Telephone Encounter (Signed)
She can bring in urine sample that we can send for culture. Once sample is collected we can send in antibiotic for her to start while waiting for results.  There is no antibody test to tell if she has had recent covid infection. They don't distinguish between antibodies from the vaccine or previous infections.

## 2021-05-28 ENCOUNTER — Telehealth (INDEPENDENT_AMBULATORY_CARE_PROVIDER_SITE_OTHER): Payer: Medicare Other

## 2021-05-28 DIAGNOSIS — N3000 Acute cystitis without hematuria: Secondary | ICD-10-CM

## 2021-05-28 LAB — POCT URINALYSIS DIPSTICK
Bilirubin, UA: NEGATIVE
Glucose, UA: NEGATIVE
Ketones, UA: NEGATIVE
Nitrite, UA: POSITIVE
Protein, UA: POSITIVE — AB
Spec Grav, UA: 1.02 (ref 1.010–1.025)
Urobilinogen, UA: 0.2 E.U./dL
pH, UA: 5 (ref 5.0–8.0)

## 2021-05-28 MED ORDER — CEPHALEXIN 500 MG PO CAPS: 500.0000 mg | ORAL_CAPSULE | Freq: Two times a day (BID) | ORAL | 0 refills | Status: DC

## 2021-05-28 NOTE — Telephone Encounter (Signed)
Left message advising [name].  [facility] sent to [facility].   Thanks,   -Mickel Baas

## 2021-05-28 NOTE — Telephone Encounter (Signed)
-----   Message from Birdie Sons, MD sent at 05/28/2021  4:26 PM EDT ----- Please start cephalexin 500mg  twice daily for 7 days for UTI.

## 2021-05-28 NOTE — Telephone Encounter (Signed)
Pt dropped off a urine sample.  The U/A showed positive for Blood, protein, leukocytes, and nitrites. It has been sent off for culture.    Thanks,   -Mickel Baas

## 2021-05-31 ENCOUNTER — Other Ambulatory Visit: Payer: Self-pay | Admitting: Family Medicine

## 2021-05-31 DIAGNOSIS — R197 Diarrhea, unspecified: Secondary | ICD-10-CM | POA: Diagnosis not present

## 2021-05-31 DIAGNOSIS — K909 Intestinal malabsorption, unspecified: Secondary | ICD-10-CM | POA: Diagnosis not present

## 2021-05-31 NOTE — Telephone Encounter (Signed)
Medication Refill - Medication: fentaNYL (DURAGESIC) 100 MCG/HR 1 patch  Pts mother asked if this can be filled today / pt is out   Has the patient contacted their pharmacy? Yes.   (Agent: If no, request that the patient contact the pharmacy for the refill.) (Agent: If yes, when and what did the pharmacy advise?)mother said they advised there were no refills and to call pcp  Preferred Pharmacy (with phone number or street name):  [id number] [name], [place] Phone:  [phone]  Fax:  [phone]     Has the patient been seen for an appointment in the last year OR does the patient have an upcoming appointment? Yes.    Agent: Please be advised that RX refills may take up to 3 business days. We ask that you follow-up with your pharmacy.

## 2021-05-31 NOTE — Telephone Encounter (Signed)
Requested medications are due for refill today.  yes  Requested medications are on the active medications list.  yes  Last refill. 04/27/2021  Future visit scheduled.   no  Notes to clinic.  Medication not delegated.

## 2021-06-01 ENCOUNTER — Other Ambulatory Visit: Payer: Self-pay | Admitting: Family Medicine

## 2021-06-01 MED ORDER — FENTANYL 100 MCG/HR TD PT72: 1.0000 | MEDICATED_PATCH | TRANSDERMAL | 0 refills | Status: DC

## 2021-06-01 NOTE — Telephone Encounter (Signed)
Requested Prescriptions  Pending Prescriptions Disp Refills  . omeprazole (PRILOSEC) 40 MG capsule [Pharmacy Med Name: OMEPRAZOLE DR 40 MG CAPSULE] 30 capsule 1    Sig: TAKE 1 CAPSULE (40 MG TOTAL) BY MOUTH DAILY.     Gastroenterology: Proton Pump Inhibitors Passed - 06/01/2021  9:34 AM      Passed - Valid encounter within last 12 months    Recent Outpatient Visits          2 months ago [place], Donald E, MD   5 months ago Ehlers-Danlos syndrome   [facility] Birdie Sons, MD   1 year ago Sinusitis, unspecified chronicity, unspecified location   [facility] Birdie Sons, MD   1 year ago Rheumatoid arthritis, involving unspecified site, unspecified whether rheumatoid factor present [facility])   [facility] Birdie Sons, MD   1 year ago Breast lump on left side at 1 o'clock position   [place], Kelby Aline, FNP      Future Appointments            In 9 months MacDiarmid, Nicki Reaper, [place]

## 2021-06-02 LAB — URINE CULTURE

## 2021-06-02 LAB — SPECIMEN STATUS REPORT

## 2021-06-03 LAB — OVA AND PARASITE EXAMINATION

## 2021-06-04 ENCOUNTER — Telehealth: Payer: Self-pay | Admitting: Family Medicine

## 2021-06-04 NOTE — Telephone Encounter (Signed)
Pts mother Janifer Adie is calling back for the lab results  CB- [phone]

## 2021-06-07 ENCOUNTER — Telehealth: Payer: Self-pay | Admitting: Gastroenterology

## 2021-06-07 NOTE — Telephone Encounter (Signed)
Pt. Calling to get results from stool sample

## 2021-06-10 ENCOUNTER — Telehealth: Payer: Self-pay

## 2021-06-10 NOTE — Telephone Encounter (Signed)
Pt's mother notified of lab results.  

## 2021-06-10 NOTE — Telephone Encounter (Signed)
-----   Message from [name], MD sent at 06/03/2021  4:47 PM EDT ----- Let the patient know that the stool test did not show any parasites or eggs associated with parasites

## 2021-06-15 ENCOUNTER — Encounter: Payer: Self-pay | Admitting: Anesthesiology

## 2021-06-15 ENCOUNTER — Encounter: Admission: RE | Payer: Self-pay | Source: Home / Self Care

## 2021-06-15 ENCOUNTER — Ambulatory Visit: Admission: RE | Admit: 2021-06-15 | Payer: Medicare Other | Source: Home / Self Care | Admitting: Gastroenterology

## 2021-06-15 ENCOUNTER — Telehealth (INDEPENDENT_AMBULATORY_CARE_PROVIDER_SITE_OTHER): Payer: Self-pay | Admitting: Family

## 2021-06-15 SURGERY — COLONOSCOPY WITH PROPOFOL
Anesthesia: General

## 2021-06-15 NOTE — Anesthesia Preprocedure Evaluation (Deleted)
Anesthesia Evaluation    Airway        Dental   Pulmonary asthma , sleep apnea and Continuous Positive Airway Pressure Ventilation ,           Cardiovascular   ECG 04/27/21:  Sinus tachycardia Borderline T abnormalities, anterior leads Artifact in lead(s) I II aVR aVL aVF   Neuro/Psych  Headaches, Seizures -,  PSYCHIATRIC DISORDERS Depression Chronic back pain    GI/Hepatic GERD  ,  Endo/Other    Renal/GU      Musculoskeletal  (+) Arthritis , Fibromyalgia -Ehlers-Danlos syndrome   Abdominal   Peds  Hematology   Anesthesia Other Findings   Reproductive/Obstetrics                             Anesthesia Physical Anesthesia Plan  ASA: 2  Anesthesia Plan: General   Post-op Pain Management:    Induction: Intravenous  PONV Risk Score and Plan: 3 and Propofol infusion, TIVA and Treatment may vary due to age or medical condition  Airway Management Planned: Natural Airway  Additional Equipment:   Intra-op Plan:   Post-operative Plan:   Informed Consent: I have reviewed the patients History and Physical, chart, labs and discussed the procedure including the risks, benefits and alternatives for the proposed anesthesia with the patient or authorized representative who has indicated his/her understanding and acceptance.       Plan Discussed with: CRNA  Anesthesia Plan Comments:         Anesthesia Quick Evaluation

## 2021-06-15 NOTE — Telephone Encounter (Signed)
Who's calling (name and relationship to patient) : Solara Goodchild mom   Best contact number: [phone]  Provider they see: Rockwell [place]   Reason for call: Patient is having issues with disease and is refusing to go to any appts. Patient is refusing to come to appt. with [name]. Mom states she is not in her right mind. Patient is also seeing things that are not there. Virtual was offered mom is unsure if [place] would be compliant for virtual.  Mom is in need of support. Mom doesn't know how to act toward her and doesn't know how to get her complaint.   Call ID:      PRESCRIPTION REFILL ONLY  Name of prescription:  Pharmacy:

## 2021-06-15 NOTE — Telephone Encounter (Signed)
I called and spoke with Mom. She said that Karen [name] will not follow instructions and is exhibiting paranoid behavior. Mom was frustrated and overwhelmed with what to do. I recommended calling an outpatient crisis line for help that she had talked with in the past and Mom agreed. TG

## 2021-06-16 ENCOUNTER — Ambulatory Visit (INDEPENDENT_AMBULATORY_CARE_PROVIDER_SITE_OTHER): Payer: Medicare Other | Admitting: Family

## 2021-06-17 ENCOUNTER — Telehealth: Payer: Self-pay | Admitting: Family Medicine

## 2021-06-17 DIAGNOSIS — F22 Delusional disorders: Secondary | ICD-10-CM

## 2021-06-17 DIAGNOSIS — R443 Hallucinations, unspecified: Secondary | ICD-10-CM

## 2021-06-17 NOTE — Telephone Encounter (Signed)
Pts mother, Jorene Guest, calling stating that the pt has been having delusions, hallucinations (auditory and visual) & extreme paranoia. "Rosilyn Coachman is my brother let him through & Dr. Abbe Amsterdam is my care giver let him through." She states that the pt is in so much pain and that the pt is afraid of being robbed. She states that she is the only person that the pt has to rely on and is requesting to have help. She states that she would like to have a home health aid that can come by the house so that she is able to go places and get things done without leaving pt at home. She states that she is not able to get her into the office for these issues because the pt will shut down, but is open to a possible virtual visit or recording the pt to show proof of this. She is requesting to have a call back. Please advise.

## 2021-06-18 NOTE — Telephone Encounter (Signed)
We can sent order for home health, but she needs a psychiatrist. Is she seeing one now or does she need a referral?

## 2021-06-18 NOTE — Telephone Encounter (Signed)
I called and spoke with patient's mom and advised her of message below. Karen [name] states that patient does not have a psychiatrist. She agrees that patient needs to see a psychiatrist and is requesting a referral. Karen [name] states  "Karen [name] was supposed to set up a psychiatry referral, but they dropped the ball and haven't done anything for [place]". Patient would like a psychiatry referral for the [place] or [place] area.

## 2021-06-22 ENCOUNTER — Telehealth: Payer: Self-pay | Admitting: Family Medicine

## 2021-06-22 ENCOUNTER — Emergency Department
Admission: EM | Admit: 2021-06-22 | Discharge: 2021-06-22 | Disposition: A | Discharge status: Home / Self Care | Payer: Medicare Other | Attending: Emergency Medicine | Admitting: Emergency Medicine

## 2021-06-22 ENCOUNTER — Telehealth: Payer: Self-pay

## 2021-06-22 ENCOUNTER — Other Ambulatory Visit: Payer: Self-pay

## 2021-06-22 ENCOUNTER — Encounter: Payer: Self-pay | Admitting: Emergency Medicine

## 2021-06-22 ENCOUNTER — Ambulatory Visit: Payer: Self-pay | Admitting: *Deleted

## 2021-06-22 DIAGNOSIS — Z638 Other specified problems related to primary support group: Secondary | ICD-10-CM

## 2021-06-22 DIAGNOSIS — Y900 Blood alcohol level of less than 20 mg/100 ml: Secondary | ICD-10-CM | POA: Diagnosis not present

## 2021-06-22 DIAGNOSIS — F29 Unspecified psychosis not due to a substance or known physiological condition: Secondary | ICD-10-CM | POA: Diagnosis not present

## 2021-06-22 DIAGNOSIS — Z20822 Contact with and (suspected) exposure to covid-19: Secondary | ICD-10-CM | POA: Insufficient documentation

## 2021-06-22 DIAGNOSIS — J45909 Unspecified asthma, uncomplicated: Secondary | ICD-10-CM | POA: Diagnosis not present

## 2021-06-22 DIAGNOSIS — G894 Chronic pain syndrome: Secondary | ICD-10-CM | POA: Insufficient documentation

## 2021-06-22 DIAGNOSIS — Z7952 Long term (current) use of systemic steroids: Secondary | ICD-10-CM | POA: Insufficient documentation

## 2021-06-22 DIAGNOSIS — R52 Pain, unspecified: Secondary | ICD-10-CM | POA: Diagnosis not present

## 2021-06-22 DIAGNOSIS — Z046 Encounter for general psychiatric examination, requested by authority: Secondary | ICD-10-CM | POA: Diagnosis present

## 2021-06-22 DIAGNOSIS — I10 Essential (primary) hypertension: Secondary | ICD-10-CM | POA: Diagnosis not present

## 2021-06-22 LAB — SALICYLATE LEVEL: Salicylate Lvl: <7 mg/dL — ABNORMAL LOW (ref 7.0–30.0)

## 2021-06-22 LAB — ACETAMINOPHEN LEVEL: Acetaminophen (Tylenol), Serum: <10 ug/mL — ABNORMAL LOW (ref 10–30)

## 2021-06-22 LAB — RESP PANEL BY RT-PCR (FLU A&B, COVID) ARPGX2
Influenza A by PCR: NEGATIVE
Influenza B by PCR: NEGATIVE
SARS Coronavirus 2 by RT PCR: NEGATIVE

## 2021-06-22 LAB — COMPREHENSIVE METABOLIC PANEL
ALT: 11 U/L (ref 0–44)
AST: 13 U/L — ABNORMAL LOW (ref 15–41)
Albumin: 4.2 g/dL (ref 3.5–5.0)
Alkaline Phosphatase: 83 U/L (ref 38–126)
Anion gap: 9 (ref 5–15)
BUN: 15 mg/dL (ref 6–20)
CO2: 26 mmol/L (ref 22–32)
Calcium: 9 mg/dL (ref 8.9–10.3)
Chloride: 100 mmol/L (ref 98–111)
Creatinine, Ser: 0.72 mg/dL (ref 0.44–1.00)
GFR, Estimated: >60 mL/min (ref >60)
Glucose, Bld: 129 mg/dL — ABNORMAL HIGH (ref 70–99)
Potassium: 3.9 mmol/L (ref 3.5–5.1)
Sodium: 135 mmol/L (ref 135–145)
Total Bilirubin: 0.7 mg/dL (ref 0.3–1.2)
Total Protein: 7.6 g/dL (ref 6.5–8.1)

## 2021-06-22 LAB — CBC
HCT: 43.3 % (ref 36.0–46.0)
Hemoglobin: 14.6 g/dL (ref 12.0–15.0)
MCH: 28.9 pg (ref 26.0–34.0)
MCHC: 33.7 g/dL (ref 30.0–36.0)
MCV: 85.7 fL (ref 80.0–100.0)
Platelets: 392 10*3/uL (ref 150–400)
RBC: 5.05 MIL/uL (ref 3.87–5.11)
RDW: 13.6 % (ref 11.5–15.5)
WBC: 8.8 10*3/uL (ref 4.0–10.5)
nRBC: 0 % (ref 0.0–0.2)

## 2021-06-22 LAB — ETHANOL: Alcohol, Ethyl (B): <10 mg/dL (ref <10)

## 2021-06-22 MED ORDER — ALBUTEROL SULFATE (2.5 MG/3ML) 0.083% IN NEBU: 2.5000 mg | INHALATION_SOLUTION | Freq: Four times a day (QID) | RESPIRATORY_TRACT | Status: DC | PRN

## 2021-06-22 MED ORDER — MOMETASONE FURO-FORMOTEROL FUM 200-5 MCG/ACT IN AERO
2.0000 | INHALATION_SPRAY | Freq: Two times a day (BID) | RESPIRATORY_TRACT | Status: DC
  Filled 2021-06-22: qty 8.8

## 2021-06-22 MED ORDER — HALOPERIDOL 1 MG PO TABS: 1.0000 mg | ORAL_TABLET | Freq: Two times a day (BID) | ORAL | Status: DC

## 2021-06-22 MED ORDER — QUETIAPINE FUMARATE 200 MG PO TABS: 200.0000 mg | ORAL_TABLET | Freq: Every day | ORAL | Status: DC

## 2021-06-22 MED ORDER — BUSPIRONE HCL 5 MG PO TABS
10.0000 mg | ORAL_TABLET | Freq: Two times a day (BID) | ORAL | Status: DC
  Administered 2021-06-22: 10 mg via ORAL
  Filled 2021-06-22: qty 2

## 2021-06-22 MED ORDER — HALOPERIDOL 0.5 MG PO TABS
0.2500 mg | ORAL_TABLET | Freq: Two times a day (BID) | ORAL | Status: DC
  Administered 2021-06-22: 0.25 mg via ORAL
  Filled 2021-06-22 (×2): qty 0.5

## 2021-06-22 MED ORDER — FENTANYL 100 MCG/HR TD PT72: 1.0000 | MEDICATED_PATCH | TRANSDERMAL | Status: DC

## 2021-06-22 MED ORDER — TIZANIDINE HCL 2 MG PO TABS: 2.0000 mg | ORAL_TABLET | Freq: Three times a day (TID) | ORAL | Status: DC | PRN

## 2021-06-22 MED ORDER — PREGABALIN 75 MG PO CAPS
300.0000 mg | ORAL_CAPSULE | Freq: Two times a day (BID) | ORAL | Status: DC
  Administered 2021-06-22: 300 mg via ORAL
  Filled 2021-06-22: qty 4

## 2021-06-22 MED ORDER — BUSPIRONE HCL 5 MG PO TABS
15.0000 mg | ORAL_TABLET | Freq: Two times a day (BID) | ORAL | Status: DC
  Administered 2021-06-22: 15 mg via ORAL
  Filled 2021-06-22: qty 3

## 2021-06-22 NOTE — ED Triage Notes (Signed)
Pt reports that she needs help with her pain that she has all over. She states that she has chronic pain. She reports that "Round Worms are making her pain worse." She reports that she has "round worms in my brain"

## 2021-06-22 NOTE — ED Notes (Signed)
EDP at bedside  

## 2021-06-22 NOTE — ED Notes (Signed)
When trying to discharge the pt the pt began to state reason as to why she is not going to leave pt stated "I have round worms in my stomach and brain" pt was reassured that she has been medically and psychiatrically cleared by the doctors. The pts mother was okay with being reassured by Dr. Jari Pigg and the pt was calm and okay with discharge.

## 2021-06-22 NOTE — ED Triage Notes (Signed)
First RN Note: Pt to ED via [facility] from home. Per EMS pt is wheelchair bound at home. Per EMS pt has prescription for 1 fentanyl patch but has been putting on 2 until her mom took over doing her medications and started putting on 1 fentanyl patch. Per EMS pt reports brain machine at the hospital telling her that she can use more than 1 fentanyl patch and is telling her other things that she won't tell EMS. Per EMS pt has ringworms in her brain and if you touch her you have to wash your arms for a very long time. Per EMS PCP suggested a visit with him but she requested EMS to bring her to ER.

## 2021-06-22 NOTE — ED Notes (Signed)
Assisted to bathroom by Luetta Nutting, RN. Pt 1 assist with standing.

## 2021-06-22 NOTE — ED Notes (Signed)
Mother here for pt for discharge.  Md at bedside.

## 2021-06-22 NOTE — ED Provider Notes (Addendum)
[facility] Emergency Department Provider Note   ____________________________________________   Event Date/Time   First MD Initiated Contact with Patient 06/22/21 1227     (approximate)  I have reviewed the triage vital signs and the nursing notes.   HISTORY  Chief Complaint Psychiatric Evaluation    HPI Karen [name] is a 45 y.o. female with past medical history of TBI, fibromyalgia, chronic pain syndrome, migraines, Ehlers-Danlos syndrome, rheumatoid arthritis, seizures, depression, and psychosis who presents to the ED for psychiatric evaluation.  Patient's primary complaint is that she deals with chronic pain in her back, arms, and legs.  She states that she is supposed to have 2 fentanyl patches daily but that her mother recently took over her medications and has only been giving her 1 fentanyl patch.  She states her chronic pain is worsening since then, and that there are "worms in my brain."  She is unable to state why she thinks these worms are present.  She denies any suicidal or homicidal ideation, denies auditory or visual hallucinations.  She was visited by local EMT today, who recommended she come to the ED for psychiatric evaluation.        Past Medical History:  Diagnosis Date   Ehlers-Danlos syndrome    Fibromyalgia    Headache    Sleep apnea    Tics of organic origin    Transient alteration of awareness     Patient Active Problem List   Diagnosis Date Noted   Major depressive disorder, recurrent episode, moderate ([place]) 54/00/8676   Acute metabolic encephalopathy 19/50/9326   Asthma 04/22/2021   Paranoid ([place]) 04/22/2021   Psychosis ([place]) 04/22/2021   UTI (urinary tract infection) 04/22/2021   Sepsis ([place]) 04/22/2021   Urinary retention 04/22/2021   Chronic tension type headache 06/04/2020   Breast lump on left side at 1 o'clock position 10/09/2019   Fibrocystic breast changes, bilateral 10/09/2019   Family history of colon  cancer    Rectal polyp    Heartburn    Excessive somnolence disorder 06/28/2017   Seizures ([place]) 09/12/2016   Difficulty hearing 08/31/2015   Back pain, chronic 08/31/2015   Rheumatoid arthritis ([place]) 08/31/2015   Alopecia 03/10/2015   Anemia 03/10/2015   Arthritis 03/10/2015   Allergic asthma, mild persistent, uncomplicated 71/24/5809   Chronic nausea 03/10/2015   DDD (degenerative disc disease), lumbar 03/10/2015   Depression with anxiety 03/10/2015   Personal history of MRSA (methicillin resistant Staphylococcus aureus) 03/10/2015   Insomnia 03/10/2015   Mitral valve prolapse 03/10/2015   Paresthesias/numbness 03/10/2015   Allergic rhinitis, seasonal 03/10/2015   Seizure disorder ([place]) 03/10/2015   Mixed sleep apnea, Central predominant 03/10/2015   Migraine without aura 01/27/2014   Fibromyalgia 01/27/2014   Transient alteration of awareness 01/07/2014   Tics of organic origin 01/07/2014   Backache, unspecified 01/07/2014   Ehlers-Danlos syndrome 01/07/2014    Past Surgical History:  Procedure Laterality Date   COLONOSCOPY WITH PROPOFOL N/A 02/13/2018   Procedure: COLONOSCOPY WITH PROPOFOL;  Surgeon: [name], MD;  Location: [facility] ENDOSCOPY;  Service: Endoscopy;  Laterality: N/A;   DILATION AND CURETTAGE OF UTERUS  2009   ESOPHAGOGASTRODUODENOSCOPY (EGD) WITH PROPOFOL  02/13/2018   Procedure: ESOPHAGOGASTRODUODENOSCOPY (EGD) WITH PROPOFOL;  Surgeon: [name], MD;  Location: [facility] ENDOSCOPY;  Service: Endoscopy;;   EYE SURGERY Left 1990   3 Surgeries on left eye to correct crossed eye   LAPAROSCOPY Left 2003   Fallopian [place]  OTHER SURGICAL HISTORY Right 1987   3 Surgeries to repair broken arm   OTHER SURGICAL HISTORY Left    3 surgeries on her left thigh as an infant   OTHER SURGICAL HISTORY  1998   Jaw surgery   Right arm surgery  1987   x 3 due to fracture   TOENAIL EXCISION Bilateral 06/13/2019   Procedure: BILATERAL SECOND TOE  PARTIAL NAIL ABLATION;  Surgeon: Erle Crocker, MD;  Location: [place];  Service: Orthopedics;  Laterality: Bilateral;  SURGERY REQUEST TIME 1 HOUR   TONSILLECTOMY  12/13/2002    Prior to Admission medications   Medication Sig Start Date End Date Taking? Authorizing Provider  albuterol (VENTOLIN HFA) 108 (90 Base) MCG/ACT inhaler Inhale 2 puffs into the lungs every 6 (six) hours as needed. 06/27/19   Deneise Lever, MD  azelastine (OPTIVAR) 0.05 % ophthalmic solution Apply to eye as directed. 1 drop in each eye prn 02/13/20   [provider]  busPIRone (BUSPAR) 10 MG tablet Take 1 tablet (10 mg total) by mouth 2 (two) times daily. Take along with 15mg  tablet twice a day 03/23/21   Birdie Sons, MD  busPIRone (BUSPAR) 15 MG tablet TAKE 1 TABLET BY MOUTH 2 TIMES DAILY. 12/30/20   Birdie Sons, MD  celecoxib (CELEBREX) 200 MG capsule TAKE 1 CAPSULE BY MOUTH TWICE A DAY 03/01/21   Birdie Sons, MD  cephALEXin (KEFLEX) 500 MG capsule Take 1 capsule (500 mg total) by mouth 2 (two) times daily. 05/28/21   Birdie Sons, MD  cyclobenzaprine (FLEXERIL) 5 MG tablet Take 1 tablet (5 mg total) by mouth 3 (three) times daily as needed for muscle spasms. Do not take within 6 hours of taking tizandine Patient not taking: No sig reported 03/26/21   Birdie Sons, MD  diclofenac Sodium (VOLTAREN) 1 % GEL APPLY AS NEEDED 3 TIMES A DAY 11/16/20   Birdie Sons, MD  DULoxetine (CYMBALTA) 60 MG capsule TAKE 2 CAPSULES BY MOUTH EVERY DAY 02/03/21   Birdie Sons, MD  fentaNYL (DURAGESIC) 100 MCG/HR Place 1 patch onto the skin every 3 (three) days. 06/01/21   Birdie Sons, MD  fexofenadine (ALLEGRA) 180 MG tablet Take 180 mg by mouth daily.    [provider]  fluticasone (FLONASE) 50 MCG/ACT nasal spray 2 sprays into each nostril Two (2) times a day. 2 sprays each nostril BID. DISP# 2 bottles = 1 month supply 05/12/21   [provider]  fluticasone  (FLOVENT HFA) 110 MCG/ACT inhaler Inhale 2 puffs into the lungs 2 (two) times daily. 04/25/21   [provider]  Fluticasone-Salmeterol (ADVAIR) 250-50 MCG/DOSE AEPB Inhale 1 puff into the lungs every 12 (twelve) hours. Rinse mouth after use Patient not taking: No sig reported 04/02/20   Baird Lyons D, MD  gabapentin (NEURONTIN) 600 MG tablet TAKE 2 TABLETS IN THE MORNING AND 3 TABLETS AT BEDTIME 01/15/21   Jodi Geralds, MD  haloperidol (HALDOL) 0.5 MG tablet Take 0.25 mg by mouth 2 (two) times daily. 05/22/21   [provider]  haloperidol (HALDOL) 1 MG tablet Take 1 mg by mouth 2 (two) times daily. Patient states she should be and has been on 5 mg twice daily. 04/15/21   [provider]  naloxone [facility]) nasal spray 4 mg/0.1 mL Place 1 spray into the nose as needed. 04/15/21   [provider]  omeprazole (PRILOSEC) 40 MG capsule Take 1 capsule (40  mg total) by mouth 2 (two) times daily. 05/25/21   [name], MD  ondansetron (ZOFRAN-ODT) 4 MG disintegrating tablet DISSOLVE 1 TABLET IN MOUTH EVERY 8 HOURS FOR NAUSEA AND VOMITING 09/29/20   Birdie Sons, MD  pregabalin (LYRICA) 300 MG capsule TAKE 1 CAPSULE BY MOUTH TWICE A DAY 05/17/21   Birdie Sons, MD  QUEtiapine (SEROQUEL) 200 MG tablet TAKE 1 TABLET BY MOUTH AT BEDTIME. 12/30/20   Birdie Sons, MD  Sod Picosulfate-Mag Ox-Cit Acd (CLENPIQ) 10-3.5-12 MG-GM -GM/160ML SOLN Take 320 mLs by mouth as directed. 05/25/21   [name], MD  tamsulosin (FLOMAX) 0.4 MG CAPS capsule Take 1 capsule (0.4 mg total) by mouth daily. 02/22/21   MacDiarmid, Nicki Reaper, MD  tiZANidine (ZANAFLEX) 4 MG tablet TAKE 0.5-1 TABLETS (2-4 MG TOTAL) BY MOUTH EVERY 8 (EIGHT) HOURS AS NEEDED FOR MUSCLE SPASMS. 04/14/21   Birdie Sons, MD    Allergies Augmentin  [amoxicillin-pot clavulanate], Chocolate flavor, Demerol [meperidine], Keflex [cephalexin], Morphine and related, Tape, Toradol [ketorolac tromethamine], Amoxicillin,  Chocolate, Nsaids, and Strawberry extract  Family History  Problem Relation Age of Onset   Depression Mother    Stroke Mother    Fibromyalgia Mother    Osteoarthritis Mother    Asthma Brother    Depression Brother    Migraines Brother    Asperger's syndrome Brother    Other Brother        Ehlers-Danlos Syndrome   Cancer Maternal Aunt    Asperger's syndrome Maternal Aunt    Breast cancer Maternal Aunt    Heart disease Paternal Aunt    Heart disease Paternal Uncle    Cervical cancer Maternal Grandmother    Osteoporosis Maternal Grandmother        Died at 41   Osteoarthritis Maternal Grandmother    Colon cancer Maternal Grandfather    Asthma Maternal Grandfather    Asperger's syndrome Maternal Grandfather    Emphysema Maternal Grandfather        Died at 63   Prostate cancer Maternal Grandfather    Heart attack Paternal Grandfather        Died at 35   Asthma Paternal Grandfather    Tuberculosis Paternal Grandfather    Cancer Cousin    Asperger's syndrome Cousin    Asperger's syndrome Other    Heart Problems Paternal 65        Died at 50   Ehlers-Danlos syndrome Father    Ehlers-Danlos syndrome Brother     Social History Social History   Tobacco Use   Smoking status: Never   Smokeless tobacco: Never  Vaping Use   Vaping Use: Never used  Substance Use Topics   Alcohol use: No    Alcohol/week: 0.0 standard drinks   Drug use: No    Review of Systems  Constitutional: No fever/chills Eyes: No visual changes. ENT: No sore throat. Cardiovascular: Denies chest pain. Respiratory: Denies shortness of breath. Gastrointestinal: No abdominal pain.  No nausea, no vomiting.  No diarrhea.  No constipation. Genitourinary: Negative for dysuria. Musculoskeletal: Positive for back, arm, and leg pain. Skin: Negative for rash. Neurological: Negative for headaches, focal weakness or numbness.  ____________________________________________   PHYSICAL EXAM:  VITAL  SIGNS: ED Triage Vitals  Enc Vitals Group     BP 06/22/21 1127 (!) 127/103     Pulse Rate 06/22/21 1127 100     Resp 06/22/21 1127 16     Temp 06/22/21 1127 97.8 F (36.6 C)     Temp  Source 06/22/21 1127 Oral     SpO2 06/22/21 1127 94 %     Weight 06/22/21 1140 125 lb (56.7 kg)     Height 06/22/21 1140 4' 11.5" (1.511 m)     Head Circumference --      Peak Flow --      Pain Score 06/22/21 1138 10     Pain Loc --      Pain Edu? --      Excl. in [place]? --     Constitutional: Alert and oriented. Eyes: Conjunctivae are normal. Head: Atraumatic. Nose: No congestion/rhinnorhea. Mouth/Throat: Mucous membranes are moist. Neck: Normal ROM Cardiovascular: Normal rate, regular rhythm. Grossly normal heart sounds.  2+ radial pulses bilaterally. Respiratory: Normal respiratory effort.  No retractions. Lungs CTAB. Gastrointestinal: Soft and nontender. No distention. Genitourinary: deferred Musculoskeletal: No lower extremity tenderness nor edema.  No upper extremity bony tenderness to palpation. Neurologic:  Normal speech and language. No gross focal neurologic deficits are appreciated. Skin:  Skin is warm, dry and intact. No rash noted. Psychiatric: Mood and affect are normal. Speech and behavior are normal.  ____________________________________________   LABS (all labs ordered are listed, but only abnormal results are displayed)  Labs Reviewed  COMPREHENSIVE METABOLIC PANEL - Abnormal; Notable for the following components:      Result Value   Glucose, Bld 129 (*)    AST 13 (*)    All other components within normal limits  SALICYLATE LEVEL - Abnormal; Notable for the following components:   Salicylate Lvl <6.9 (*)    All other components within normal limits  ACETAMINOPHEN LEVEL - Abnormal; Notable for the following components:   Acetaminophen (Tylenol), Serum <10 (*)    All other components within normal limits  RESP PANEL BY RT-PCR (FLU A&B, COVID) ARPGX2  ETHANOL  CBC  URINE  DRUG SCREEN, QUALITATIVE ([facility] ONLY)  POC URINE PREG, ED    PROCEDURES  Procedure(s) performed (including Critical Care):  Procedures   ____________________________________________   INITIAL IMPRESSION / ASSESSMENT AND PLAN / ED COURSE      45 year old female with past medical history of TBI, fibromyalgia, chronic pain syndrome, migraines, Ehlers-Danlos syndrome, rheumatoid arthritis, seizures, depression, and psychosis who presents to the ED for psychiatric evaluation as she reports having "roundworms in my brain."  She denies any suicidal or homicidal ideation, does not appear to be a threat to herself or others and there is no indication for IVC.  She does complain of diffuse pain and her primary concern seems to be that her mother removed the second fentanyl patch that she was using.  Pain that she complains of appears to be chronic and she is neurovascular intact to all 4 extremities, no evidence of infectious process.  She may be medically cleared for psychiatric disposition.  The patient has been placed in psychiatric observation due to the need to provide a safe environment for the patient while obtaining psychiatric consultation and evaluation, as well as ongoing medical and medication management to treat the patient's condition.  The patient has not been placed under full IVC at this time.       ____________________________________________   FINAL CLINICAL IMPRESSION(S) / ED DIAGNOSES  Final diagnoses:  Psychosis, unspecified psychosis type ([place])  Chronic pain syndrome     ED Discharge Orders     None        Note:  This document was prepared using Dragon voice recognition software and may include unintentional dictation errors.    Blake Divine,  MD 06/22/21 1353    Blake Divine, MD 06/22/21 1354

## 2021-06-22 NOTE — Discharge Instructions (Signed)
Return to the ER for any other concern

## 2021-06-22 NOTE — Telephone Encounter (Signed)
Copied from [place] [id number]. Topic: Appointment Scheduling - Scheduling Inquiry for Clinic >> Jun 22, 2021 10:21 AM Karen [name] wrote: Reason for CRM: The patient was taken to [facility] for behavioral health concerns   The patient's mother was directed to contact the practice to schedule and discuss [name] follow up   Agent was unable to successfully schedule at the time of call   Please contact further

## 2021-06-22 NOTE — ED Notes (Signed)
Contacted mother, states she will be here to pick up patient at [facility].

## 2021-06-22 NOTE — ED Notes (Signed)
Went to bathroom, RN placed hat in toilet. Pt removed hat and urinated in toilet. No sample able to be collected. Pt given fresh pad as she is on her menstrual cycle.

## 2021-06-22 NOTE — ED Notes (Signed)
Hospital meal provided.  100% consumed, pt tolerated w/o complaints.  Waste discarded appropriately.   

## 2021-06-22 NOTE — Telephone Encounter (Signed)
Called number below and advised by nurse that patient is in process of IVC and doesn't know what the issue is with Seroquel.

## 2021-06-22 NOTE — ED Notes (Signed)
VOLUNTARY/AWAITING PSYCH CONSULT

## 2021-06-22 NOTE — Telephone Encounter (Signed)
I'm Karen [name] with EMS.  I'm with the pt and her Mom now.   There's some confusion about her Fentenyl patch.   On box it says to leave on and change it every 3 days.   We are concerned about the dosing.   Did he increase it?   Her Mom took over her prescriptions yesterday and she's questioning the dose.   Mother has access to all her medical records.  Elliot Dally. She is confused because Dr. Caryn Section increased the dose    There is a brain machine that told her she can have more than 1 patch.   The brain machine at the hospital told lher she could have more than 1 patch and it's like a record player.  I called into [facility] and spoke to [place].   I let her know what the EMT said and she is getting Dr. Caryn Section to the phone.  Irean Hong asked me to hang up and Karen [name] the EMT would be connected to Dr. Caryn Section.     Reason for Disposition  [1] Caller has URGENT medicine question about med that PCP or specialist prescribed AND [2] triager unable to answer question    Call connected to Dr. Caryn Section to speak with the EMT.  Answer Assessment - Initial Assessment Questions 1. NAME of MEDICATION: "What medicine are you calling about?"     Fentanyl patch Karen [name] EMT is at the house with the pt and her mother now with a question regarding the dose. 2. QUESTION: "What is your question?" (e.g., double dose of medicine, side effect)     What is the dose?   3. PRESCRIBING HCP: "Who prescribed it?" Reason: if prescribed by specialist, call should be referred to that group.     Dr. Lelon Huh 4. SYMPTOMS: "Do you have any symptoms?"     N/A  As Dr. Caryn Section was connected with the EMT at this point 5. SEVERITY: If symptoms are present, ask "Are they mild, moderate or severe?"     N/A 6. PREGNANCY:  "Is there any chance that you are pregnant?" "When was your last menstrual period?"     N/A  Protocols used: Medication Question Call-A-AH

## 2021-06-22 NOTE — Telephone Encounter (Signed)
Karen [name], EMT called into [facility] needing to speak with Dr. Caryn Section regarding Fentanyl patch dosage.   See documentation notes for details.  EMT was connected directed to Dr. Caryn Section.   I disconnected from the call at this point.

## 2021-06-22 NOTE — Telephone Encounter (Signed)
Copied from [place] [id number]. Topic: General - Other >> Jun 22, 2021  2:31 PM Leward Quan A wrote: Reason for CRM: Patient mom called in to say that patient is at the ER at [facility] and that the nurse is waiting on a call from Dr Caryn Section at Ph# [phone] need to know the dose of  QUEtiapine (SEROQUEL) 200 MG tablet because per mother patient does not have any of this medication. Per mother if Dr Or a nurse does not call the [facility] patient can not be discharged. Please advise ASAP Ph# [phone]

## 2021-06-22 NOTE — ED Provider Notes (Signed)
Patient was cleared for discharge home.  When mom arrived child was stating that she needed to be rechecked in.  Mom was also concerned about patient being discharged home.  I asked if mom can talk to the psychiatry team and she stated that she did.  I stated that I had a urgent pediatric case in a room but if they wanted to wait to be reevaluated by psychiatry in the morning or give me some time to look closer to the record so they would have to wait until I was done with the pediatric case.  When I returned a few minutes later daughter and mom had already left the hospital         Vanessa Cushing, MD 06/22/21 2016

## 2021-06-22 NOTE — Consult Note (Addendum)
[facility] Consult   Reason for Consult: Patient does stating that she needs only 1 Fentanyl patch Referring Physician: [place] Patient Identification: Karen [name] MRN:  [id number] Principal Diagnosis: <principal problem not specified> Diagnosis:  Active Problems:   * No active hospital problems. *   Total Time spent with patient: 1 hour  Subjective:   Karen [name] is a 45 y.o. female patient admitted with "I am in pain.".  HPI: Patient seen and chart reviewed.  Patient arrived via EMS because she she feels as if her mother is withholding her medication as it is prescribed.  Patient states that she is prescribed 2 fentanyl patches and her mom took one off.  She states that she was in a lot of pain so EMS was called. ( Per this provider's conversation with mom, EMS was called by a neighbor while mom was asleep and patient went outside and waited for a neighbor to come outside and told him to call EMS).  Patient states that her long time neurologist, Dr. Gaynell Face, who retired last week has always given her 2 fentanyl patches.  According to PDMP, fentanyl 100 mcg/h patch #10 for 30 days is prescribed.  This would equate to 1 patch every 3 days.  Patient continues to dispute this and says she is in pain and that is why she came to the emergency room.  It was last filled 06/01/2021.  Patient denies any suicidal or homicidal ideations.  Denies auditory or visual hallucinations.  When writer asked about, and she may need upon presentation to ED of "worms in her brain" she states that she does not remember saying that.  Patient states that she is taking her medications as directed.  Writer advised patient that we are not going to give her another Fentanyl patch and she needs to talk to her prescribing MD (Dr. Caryn Section) to discuss any medication changes.   Collateral from patient's mother, Karen [name] [phone].  Mother states that she found all of the patient's medications and  they are full bottles.  Patient has not been taking her medications.  Mother states she has taken everything from the patient as of yesterday and will be the one to give her her medications.  Mother confirms that she is only to have 1 Fentanyl patch and patient got very mad yesterday when mother took one of them off of her because patient had put 2 on.  Mother was asleep when patient went outside and sat on the porch in her motorized wheelchair until she saw a neighbor and had him call EMS.  Mother had a question about Haldol that Dr. Caryn Section wanted. Told her that we had 1.25 mg twice daily.  Mother also states that she does not have any prescription for Seroquel.  Advised mother that it was prescribed when she was discharged from [facility] after having a UTI on 04/24/2021. Mother is going to call Dr. Caryn Section to confirm what psychiatric medications he has her on. Reiterate to mother that it is imperative for patient to not have access to her meds, especially fentanyl, as this can be fatal if not administered according to prescription.  Mother stated "Absolutely, I will be administering all of her meds now; she could die from using 2 patches.  They will be out of her reach."     Past Psychiatric History: Inpatient hospitalization at [facility] for psychotic symptoms in August 2022.  In [facility] ED, again surrounding issues of pain and the fentanyl patch.  Risk to Self:   Risk to Others:   Prior Inpatient Therapy:   Prior Outpatient Therapy:    Past Medical History:  Past Medical History:  Diagnosis Date   Ehlers-Danlos syndrome    Fibromyalgia    Headache    Sleep apnea    Tics of organic origin    Transient alteration of awareness     Past Surgical History:  Procedure Laterality Date   COLONOSCOPY WITH PROPOFOL N/A 02/13/2018   Procedure: COLONOSCOPY WITH PROPOFOL;  Surgeon: [name], MD;  Location: [facility] ENDOSCOPY;  Service: Endoscopy;  Laterality: N/A;   DILATION AND CURETTAGE OF UTERUS  2009    ESOPHAGOGASTRODUODENOSCOPY (EGD) WITH PROPOFOL  02/13/2018   Procedure: ESOPHAGOGASTRODUODENOSCOPY (EGD) WITH PROPOFOL;  Surgeon: [name], MD;  Location: [facility];  Service: Endoscopy;;   EYE SURGERY Left 1990   3 Surgeries on left eye to correct crossed eye   LAPAROSCOPY Left 2003   Fallopian Tube   [place]   3 Surgeries to repair broken arm   OTHER SURGICAL HISTORY Left    3 surgeries on her left thigh as an infant   OTHER SURGICAL HISTORY  1998   Jaw surgery   Right arm surgery  1987   x 3 due to fracture   TOENAIL EXCISION Bilateral 06/13/2019   Procedure: BILATERAL SECOND TOE PARTIAL NAIL ABLATION;  Surgeon: Erle Crocker, MD;  Location: [facility];  Service: Orthopedics;  Laterality: Bilateral;  SURGERY REQUEST TIME 1 HOUR   TONSILLECTOMY  12/13/2002   Family History:  Family History  Problem Relation Age of Onset   Depression Mother    Stroke Mother    Fibromyalgia Mother    Osteoarthritis Mother    Asthma Brother    Depression Brother    Migraines Brother    Asperger's syndrome Brother    Other Brother        Ehlers-Danlos Syndrome   Cancer Maternal Aunt    Asperger's syndrome Maternal Aunt    Breast cancer Maternal Aunt    Heart disease Paternal Aunt    Heart disease Paternal Uncle    Cervical cancer Maternal Grandmother    Osteoporosis Maternal Grandmother        Died at 21   Osteoarthritis Maternal Grandmother    Colon cancer Maternal Grandfather    Asthma Maternal Grandfather    Asperger's syndrome Maternal Grandfather    Emphysema Maternal Grandfather        Died at 3   Prostate cancer Maternal Grandfather    Heart attack Paternal Grandfather        Died at 18   Asthma Paternal Grandfather    Tuberculosis Paternal Grandfather    Cancer Cousin    Asperger's syndrome Cousin    Asperger's syndrome Other    Heart Problems Paternal Grandmother        Died at 16    Ehlers-Danlos syndrome Father    Ehlers-Danlos syndrome Brother    Family Psychiatric  History: see previou Social History:  Social History   Substance and Sexual Activity  Alcohol Use No   Alcohol/week: 0.0 standard drinks     Social History   Substance and Sexual Activity  Drug Use No    Social History   Socioeconomic History   Marital status: Single    Spouse name: Not on file   Number of children: Not on file   Years of education: Not on file  Highest education level: Not on file  Occupational History   Not on file  Tobacco Use   Smoking status: Never   Smokeless tobacco: Never  Vaping Use   Vaping Use: Never used  Substance and Sexual Activity   Alcohol use: No    Alcohol/week: 0.0 standard drinks   Drug use: No   Sexual activity: Never  Other Topics Concern   Not on file  Social History Narrative   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    She lives with her mother.    She enjoys reading, watching TV, writing, and painting.    Social Determinants of Health   Financial Resource Strain: Not on file  Food Insecurity: Not on file  Transportation Needs: Not on file  Physical Activity: Not on file  Stress: Not on file  Social Connections: Not on file   Additional Social History:    Allergies:   Allergies  Allergen Reactions   Augmentin  [Amoxicillin-Pot Clavulanate] Diarrhea   Chocolate Flavor     Other reaction(s): Other (See Comments) Wheezing, acne   Demerol [Meperidine] Other (See Comments)    Slow to wake up when this drug is given.    Keflex [Cephalexin] Nausea And Vomiting   Morphine And Related Nausea And Vomiting   Tape Dermatitis    Must use paper tape only   Toradol [Ketorolac Tromethamine] Nausea And Vomiting   Amoxicillin     Other reaction(s): Unknown   Chocolate     GI distress   Nsaids     patient develops ulcers Other reaction(s): Other (See Comments) patient develops ulcers   Strawberry Extract     GI distress    Labs:   Results for orders placed or performed during the hospital encounter of 06/22/21 (from the past 48 hour(s))  Comprehensive metabolic panel     Status: Abnormal   Collection Time: 06/22/21 11:44 AM  Result Value Ref Range   Sodium 135 135 - 145 mmol/L   Potassium 3.9 3.5 - 5.1 mmol/L   Chloride 100 98 - 111 mmol/L   CO2 26 22 - 32 mmol/L   Glucose, Bld 129 (H) 70 - 99 mg/dL    Comment: Glucose reference range applies only to samples taken after fasting for at least 8 hours.   BUN 15 6 - 20 mg/dL   Creatinine, Ser 0.72 0.44 - 1.00 mg/dL   Calcium 9.0 8.9 - 10.3 mg/dL   Total Protein 7.6 6.5 - 8.1 g/dL   Albumin 4.2 3.5 - 5.0 g/dL   AST 13 (L) 15 - 41 U/L   ALT 11 0 - 44 U/L   Alkaline Phosphatase 83 38 - 126 U/L   Total Bilirubin 0.7 0.3 - 1.2 mg/dL   GFR, Estimated >60 >60 mL/min    Comment: (NOTE) Calculated using the CKD-EPI Creatinine Equation (2021)    Anion gap 9 5 - 15    Comment: Performed at [facility], [address], [place], [place] [address]  Ethanol     Status: None   Collection Time: 06/22/21 11:44 AM  Result Value Ref Range   Alcohol, Ethyl (B) <10 <10 mg/dL    Comment: (NOTE) Lowest detectable limit for serum alcohol is 10 mg/dL.  For medical purposes only. Performed at [facility], [place], [place] [address]   Salicylate level     Status: Abnormal   Collection Time: 06/22/21 11:44 AM  Result Value Ref Range   Salicylate Lvl <1.2 (L)  7.0 - 30.0 mg/dL    Comment: Performed at [facility], [address] [place], [place] [address]  Acetaminophen level     Status: Abnormal   Collection Time: 06/22/21 11:44 AM  Result Value Ref Range   Acetaminophen (Tylenol), Serum <10 (L) 10 - 30 ug/mL    Comment: (NOTE) Therapeutic concentrations vary significantly. A range of 10-30 ug/mL  may be an effective concentration for many patients. However, some  are best treated at concentrations outside of this  range. Acetaminophen concentrations >150 ug/mL at 4 hours after ingestion  and >50 ug/mL at 12 hours after ingestion are often associated with  toxic reactions.  Performed at [facility], [address] [place], [place] [address]   cbc     Status: None   Collection Time: 06/22/21 11:44 AM  Result Value Ref Range   WBC 8.8 4.0 - 10.5 K/uL   RBC 5.05 3.87 - 5.11 MIL/uL   Hemoglobin 14.6 12.0 - 15.0 g/dL   HCT 43.3 36.0 - 46.0 %   MCV 85.7 80.0 - 100.0 fL   MCH 28.9 26.0 - 34.0 pg   MCHC 33.7 30.0 - 36.0 g/dL   RDW 13.6 11.5 - 15.5 %   Platelets 392 150 - 400 K/uL   nRBC 0.0 0.0 - 0.2 %    Comment: Performed at [facility], [address], [place], [place] [address]  Resp Panel by RT-PCR (Flu A&B, Covid) Nasopharyngeal Swab     Status: None   Collection Time: 06/22/21 12:47 PM   Specimen: Nasopharyngeal Swab; Nasopharyngeal(NP) swabs in vial transport medium  Result Value Ref Range   SARS Coronavirus 2 by RT PCR NEGATIVE NEGATIVE    Comment: (NOTE) SARS-CoV-2 target nucleic acids are NOT DETECTED.  The SARS-CoV-2 RNA is generally detectable in upper respiratory specimens during the acute phase of infection. The lowest concentration of SARS-CoV-2 viral copies this assay can detect is 138 copies/mL. A negative result does not preclude SARS-Cov-2 infection and should not be used as the sole basis for treatment or other patient management decisions. A negative result may occur with  improper specimen collection/handling, submission of specimen other than nasopharyngeal swab, presence of viral mutation(s) within the areas targeted by this assay, and inadequate number of viral copies(<138 copies/mL). A negative result must be combined with clinical observations, patient history, and epidemiological information. The expected result is Negative.  Fact Sheet for Patients:  EntrepreneurPulse.com.au  Fact Sheet for Healthcare Providers:   IncredibleEmployment.be  This test is no t yet approved or cleared by the [place] FDA and  has been authorized for detection and/or diagnosis of SARS-CoV-2 by FDA under an Emergency Use Authorization (EUA). This EUA will remain  in effect (meaning this test can be used) for the duration of the COVID-19 declaration under Section 564(b)(1) of the Act, 21 U.S.C.section 360bbb-3(b)(1), unless the authorization is terminated  or revoked sooner.       Influenza A by PCR NEGATIVE NEGATIVE   Influenza B by PCR NEGATIVE NEGATIVE    Comment: (NOTE) The Xpert Xpress SARS-CoV-2/FLU/RSV plus assay is intended as an aid in the diagnosis of influenza from Nasopharyngeal swab specimens and should not be used as a sole basis for treatment. Nasal washings and aspirates are unacceptable for Xpert Xpress SARS-CoV-2/FLU/RSV testing.  Fact Sheet for Patients: EntrepreneurPulse.com.au  Fact Sheet for Healthcare Providers: IncredibleEmployment.be  This test is not yet approved or cleared by the [place] and has been authorized for  detection and/or diagnosis of SARS-CoV-2 by FDA under an Emergency Use Authorization (EUA). This EUA will remain in effect (meaning this test can be used) for the duration of the COVID-19 declaration under Section 564(b)(1) of the Act, 21 U.S.C. section 360bbb-3(b)(1), unless the authorization is terminated or revoked.  Performed at [facility], [address] [place], [place] [address]     Current Facility-Administered Medications  Medication Dose Route Frequency Provider Last Rate Last Admin   albuterol (PROVENTIL) (2.5 MG/3ML) 0.083% nebulizer solution 2.5 mg  2.5 mg Inhalation Q6H PRN Blake Divine, MD       busPIRone (BUSPAR) tablet 10 mg  10 mg Oral BID Blake Divine, MD   10 mg at 06/22/21 1550   busPIRone (BUSPAR) tablet 15 mg  15 mg Oral BID Blake Divine, MD   15 mg at  06/22/21 1550   [START ON 06/24/2021] fentaNYL (DURAGESIC) 100 MCG/HR 1 patch  1 patch Transdermal Q72H Blake Divine, MD       haloperidol (HALDOL) tablet 0.25 mg  0.25 mg Oral BID Blake Divine, MD   0.25 mg at 06/22/21 1534   mometasone-formoterol (DULERA) 200-5 MCG/ACT inhaler 2 puff  2 puff Inhalation BID Blake Divine, MD       pregabalin (LYRICA) capsule 300 mg  300 mg Oral BID Blake Divine, MD   300 mg at 06/22/21 1549   QUEtiapine (SEROQUEL) tablet 200 mg  200 mg Oral QHS Blake Divine, MD       tiZANidine (ZANAFLEX) tablet 2-4 mg  2-4 mg Oral Q8H PRN Blake Divine, MD       Current Outpatient Medications  Medication Sig Dispense Refill   azelastine (OPTIVAR) 0.05 % ophthalmic solution Apply to eye as directed. 1 drop in each eye prn     busPIRone (BUSPAR) 10 MG tablet Take 1 tablet (10 mg total) by mouth 2 (two) times daily. Take along with 15mg  tablet twice a day 180 tablet 4   busPIRone (BUSPAR) 15 MG tablet TAKE 1 TABLET BY MOUTH 2 TIMES DAILY. 180 tablet 2   celecoxib (CELEBREX) 200 MG capsule TAKE 1 CAPSULE BY MOUTH TWICE A DAY 60 capsule 5   DULoxetine (CYMBALTA) 60 MG capsule TAKE 2 CAPSULES BY MOUTH EVERY DAY 180 capsule 3   fentaNYL (DURAGESIC) 100 MCG/HR Place 1 patch onto the skin every 3 (three) days. 10 patch 0   fexofenadine (ALLEGRA) 180 MG tablet Take 180 mg by mouth daily.     fluticasone (FLOVENT HFA) 110 MCG/ACT inhaler Inhale 2 puffs into the lungs 2 (two) times daily.     gabapentin (NEURONTIN) 600 MG tablet TAKE 2 TABLETS IN THE MORNING AND 3 TABLETS AT BEDTIME 155 tablet 5   haloperidol (HALDOL) 0.5 MG tablet Take 0.25 mg by mouth 2 (two) times daily.     omeprazole (PRILOSEC) 40 MG capsule Take 1 capsule (40 mg total) by mouth 2 (two) times daily. 60 capsule 6   pregabalin (LYRICA) 300 MG capsule TAKE 1 CAPSULE BY MOUTH TWICE A DAY 180 capsule 1   tamsulosin (FLOMAX) 0.4 MG CAPS capsule Take 1 capsule (0.4 mg total) by mouth daily. 90 capsule 3    tiZANidine (ZANAFLEX) 4 MG tablet TAKE 0.5-1 TABLETS (2-4 MG TOTAL) BY MOUTH EVERY 8 (EIGHT) HOURS AS NEEDED FOR MUSCLE SPASMS. 270 tablet 1   albuterol (VENTOLIN HFA) 108 (90 Base) MCG/ACT inhaler Inhale 2 puffs into the lungs every 6 (six) hours as needed. 8 g 12   cephALEXin (KEFLEX) 500  MG capsule Take 1 capsule (500 mg total) by mouth 2 (two) times daily. (Patient not taking: No sig reported) 14 capsule 0   cyclobenzaprine (FLEXERIL) 5 MG tablet Take 1 tablet (5 mg total) by mouth 3 (three) times daily as needed for muscle spasms. Do not take within 6 hours of taking tizandine (Patient not taking: No sig reported) 30 tablet 2   diclofenac Sodium (VOLTAREN) 1 % GEL APPLY AS NEEDED 3 TIMES A DAY 100 g 0   fluticasone (FLONASE) 50 MCG/ACT nasal spray 2 sprays into each nostril Two (2) times a day. 2 sprays each nostril BID. DISP# 2 bottles = 1 month supply     Fluticasone-Salmeterol (ADVAIR) 250-50 MCG/DOSE AEPB Inhale 1 puff into the lungs every 12 (twelve) hours. Rinse mouth after use (Patient not taking: No sig reported) 60 each 11   haloperidol (HALDOL) 1 MG tablet Take 1 mg by mouth 2 (two) times daily. Patient states she should be and has been on 5 mg twice daily. (Patient not taking: Reported on 06/22/2021)     naloxone [facility]) nasal spray 4 mg/0.1 mL Place 1 spray into the nose as needed.     ondansetron (ZOFRAN-ODT) 4 MG disintegrating tablet DISSOLVE 1 TABLET IN MOUTH EVERY 8 HOURS FOR NAUSEA AND VOMITING 30 tablet 5   QUEtiapine (SEROQUEL) 200 MG tablet TAKE 1 TABLET BY MOUTH AT BEDTIME. 90 tablet 5   Sod Picosulfate-Mag Ox-Cit Acd (CLENPIQ) 10-3.5-12 MG-GM -GM/160ML SOLN Take 320 mLs by mouth as directed. 320 mL 0    Musculoskeletal: Strength & Muscle Tone: decreased Gait & Station:  not ambulatory Patient leans: N/A  Psychiatric Specialty Exam:  Presentation  General Appearance: Disheveled  Eye Contact:Fair  Speech:Clear and Coherent  Speech  Volume:Normal  Handedness:Right   Mood and Affect  Mood:Irritable  Affect:Blunt   Thought Process  Thought Processes:Coherent  Descriptions of Associations:Intact  Orientation:Full (Time, Place and Person)  Thought Content:WDL  History of Schizophrenia/Schizoaffective disorder:No  Duration of Psychotic Symptoms:Less than six months  Hallucinations:Hallucinations: None Ideas of Reference:None  Suicidal Thoughts:Suicidal Thoughts: No Homicidal Thoughts:Homicidal Thoughts: No  Sensorium  Memory:Immediate Good  Judgment:Poor  Insight:Poor   Executive Functions  Concentration:Fair  Attention Span:Fair  [place]   Psychomotor Activity  Psychomotor Activity:Psychomotor Activity: Normal  Assets  Assets:Financial Resources/Insurance; Housing; Social Support; Resilience   Sleep  Sleep:Sleep: Fair  Physical Exam: Physical Exam Vitals and nursing note reviewed.  HENT:     Head: Normocephalic.     Nose: No congestion or rhinorrhea.  Eyes:     General:        Right eye: No discharge.        Left eye: No discharge.  Cardiovascular:     Rate and Rhythm: Normal rate.  Pulmonary:     Effort: Pulmonary effort is normal.  Musculoskeletal:        General: Normal range of motion.     Cervical back: Normal range of motion.  Skin:    General: Skin is dry.  Neurological:     Mental Status: She is alert and oriented to person, place, and time.  Psychiatric:        Attention and Perception: Attention normal.        Mood and Affect: Mood normal.        Speech: Speech normal.        Thought Content: Thought content normal.        Judgment: Judgment is impulsive.  Review of Systems  Psychiatric/Behavioral:  Negative for depression, hallucinations, memory loss, substance abuse and suicidal ideas. The patient is not nervous/anxious and does not have insomnia.        Denies all at this time. Hx of psychosis  All  other systems reviewed and are negative. Blood pressure (!) 127/103, pulse 100, temperature 97.8 F (36.6 C), temperature source Oral, resp. rate 16, height 4' 11.5" (1.511 m), weight 56.7 kg, SpO2 94 %. Body mass index is 24.82 kg/m.  Treatment Plan Summary: 45 year old female with a complicated medical history, presented to the ED via EMS after she had someone recall because "she was in pain."  Patient is insistent that she should have 2 fentanyl patches, that they were prescribed.  Documentation does not support this.  Patient denies suicidal or homicidal ideations.  She does have a history of psychotic irrational thoughts and is prescribed medication for these.  Patient is noncompliant with her medications.  Patient does not require inpatient psychiatric hospitalization.  Recommend to discharge home at this time and follow up with her outpatient providers.  Mother very strongly warned against allowing patient to have access to her own medications.  Mother expresses clear understanding of this and states that she will keep them from her and be the one to administer her medications to her. Discussed plan with Dr. [name] and EDP  Disposition: No evidence of imminent risk to self or others at present.   Patient does not meet criteria for psychiatric inpatient admission. Supportive therapy provided about ongoing stressors. Discussed crisis plan, support from social network, calling 911, coming to the Emergency Department, and calling Suicide Hotline.  Sherlon Handing, NP 06/22/2021 5:44 PM

## 2021-06-30 ENCOUNTER — Other Ambulatory Visit: Payer: Self-pay | Admitting: Family Medicine

## 2021-06-30 ENCOUNTER — Telehealth: Payer: Self-pay | Admitting: Family Medicine

## 2021-06-30 NOTE — Telephone Encounter (Signed)
Medication Refill - Medication:   fentaNYL (DURAGESIC) 100 MCG/HR  Has the patient contacted their pharmacy? No. Pt was advised to contact pharmacy but did not want to, stated it needs to be ready by this Friday.   Preferred Pharmacy (with phone number or street name):   [id number] [name], [place]  [place] [address]  Phone: [phone] Fax: [phone]   Has the patient been seen for an appointment in the last year OR does the patient have an upcoming appointment? Yes.    Agent: Please be advised that RX refills may take up to 3 business days. We ask that you follow-up with your pharmacy.

## 2021-06-30 NOTE — Telephone Encounter (Signed)
Patient called in says she missed call, nothing is in system about it.

## 2021-06-30 NOTE — Telephone Encounter (Signed)
Requested medications are due for refill today.  yes  Requested medications are on the active medications list.  yes  Last refill. 06/01/2021  Future visit scheduled.   no  Notes to clinic.  Medication not delegated. 

## 2021-07-01 MED ORDER — FENTANYL 100 MCG/HR TD PT72: 1.0000 | MEDICATED_PATCH | TRANSDERMAL | 0 refills | Status: DC

## 2021-07-04 DIAGNOSIS — R1084 Generalized abdominal pain: Secondary | ICD-10-CM | POA: Diagnosis not present

## 2021-07-04 DIAGNOSIS — G8929 Other chronic pain: Secondary | ICD-10-CM | POA: Diagnosis not present

## 2021-07-04 DIAGNOSIS — M545 Low back pain, unspecified: Secondary | ICD-10-CM | POA: Diagnosis not present

## 2021-07-05 ENCOUNTER — Telehealth: Payer: Self-pay

## 2021-07-05 DIAGNOSIS — M5136 Other intervertebral disc degeneration, lumbar region: Secondary | ICD-10-CM

## 2021-07-05 DIAGNOSIS — Q796 Ehlers-Danlos syndrome, unspecified: Secondary | ICD-10-CM

## 2021-07-05 DIAGNOSIS — M069 Rheumatoid arthritis, unspecified: Secondary | ICD-10-CM

## 2021-07-05 DIAGNOSIS — R002 Palpitations: Secondary | ICD-10-CM | POA: Diagnosis not present

## 2021-07-05 NOTE — Telephone Encounter (Signed)
Copied from [place] [id number]. Topic: General - Inquiry >> Jul 05, 2021  3:37 PM Greggory Keen D wrote: Reason for CRM: Pt's mother called and wanted a nurse to call back.  Mother would not disclose what the call was about.  CB#  [phone]

## 2021-07-05 NOTE — Telephone Encounter (Signed)
Tried calling patients mom. No answer. OK for [facility] triage to speak with patients mom if she returns call.

## 2021-07-05 NOTE — Telephone Encounter (Signed)
Able to reach pt's mother. States pt in ED yesterday "Intractable pain all over." States ED physician recommended she see neurologist. Pt is established, advised referral not necessary. Also states she is calling pt's GYN as pt has C/O pelvic pain  4-5 months. States was also advised to have pt referred to pain clinic and would need referral from Dr. Caryn Section. She would like Dr. Caryn Section to know "Karen [name]'s 2 1/2 month psychotic break has resolved."  Also would like office manager to know "The off site people who answer the phone ask too many personal questions". Advised pt's mother to handle as she had with declining questions and agent will send through.  Verbalizes understanding of all. Assured NT would route to practice for PCPs review and final disposition.

## 2021-07-06 NOTE — Addendum Note (Signed)
Addended by: Birdie Sons on: 07/06/2021 05:10 PM   Modules accepted: Orders

## 2021-07-19 ENCOUNTER — Other Ambulatory Visit (INDEPENDENT_AMBULATORY_CARE_PROVIDER_SITE_OTHER): Payer: Self-pay | Admitting: Family

## 2021-07-19 DIAGNOSIS — Q796 Ehlers-Danlos syndrome, unspecified: Secondary | ICD-10-CM

## 2021-07-19 DIAGNOSIS — M797 Fibromyalgia: Secondary | ICD-10-CM

## 2021-07-19 DIAGNOSIS — Z23 Encounter for immunization: Secondary | ICD-10-CM | POA: Diagnosis not present

## 2021-07-19 MED ORDER — GABAPENTIN 600 MG PO TABS: ORAL_TABLET | ORAL | 3 refills | Status: DC

## 2021-07-19 MED ORDER — HALOPERIDOL 0.5 MG PO TABS: 0.2500 mg | ORAL_TABLET | Freq: Two times a day (BID) | ORAL | 3 refills | Status: DC

## 2021-07-20 ENCOUNTER — Ambulatory Visit: Payer: BC Managed Care – PPO | Admitting: Family Medicine

## 2021-07-20 ENCOUNTER — Ambulatory Visit: Payer: Self-pay | Admitting: *Deleted

## 2021-07-20 NOTE — Telephone Encounter (Signed)
Reason for Disposition  Influenza (TIV; Injection) injected vaccine reactions  Answer Assessment - Initial Assessment Questions 1. SYMPTOMS: "What is the main symptom?" (e.g., redness, swelling, pain)      Mother calling in for pt.   I do all my calling for her.   I take care of her.    Got a flu shot and Covid shot yesterday.   I feel bad today.   I have a fever 101 and body aches, headache since the shots.  2. ONSET: "When was the vaccine (shot) given?" "How much later did the Symptoms begin?" (e.g., hours, days ago)      Got shots yesterday 4:00.     Symptoms started 7:00 PM last night 3. SEVERITY: "How bad is it?"      I feel bad. 4. FEVER: "Is there a fever?" If Yes, ask: "What is it, how was it measured, and when did it start?"      101 this morning 5. IMMUNIZATIONS GIVEN: "What shots have you recently received?"     Yes  The flu and Covid shots yesterday.       6. PAST REACTIONS: "Have you reacted to immunizations before?" If Yes, ask: "What happened?"     No 7. OTHER SYMPTOMS: "Do you have any other symptoms?"     Headache, body aches, fever  Protocols used: Immunization Reactions-A-AH

## 2021-07-20 NOTE — Telephone Encounter (Signed)
FYI

## 2021-07-20 NOTE — Telephone Encounter (Signed)
Pt's caregiver called in (at first I thought I was talking with Laetitia herself but come to find out later in the call I was talking with her mother and caregiver Linet Brash).  Kaelea got her flu and Covid vaccines yesterday about 4:00 and is now having body aches, fever 101 and feeling bad. Her mother also got the flu shot and Covid vaccine and is not feeling well either from the shots and she is the one that drives [place] to her appts because Adiel has seizures and doesn't drive.   [name] requested to cancel the appt for today and reschedule which I did. I cancelled the appt with Dr. Caryn Section for this morning at 8:40 and rescheduled for 09/28/2020 at 3:20 and put Irlanda on the wait list in case of a cancellation.  Notes sent to [place] went over the care advice for vaccine side effects and that it is normal for some people to have this kind of reaction to vaccines especially when getting 2 at a time.   I gave instructions when to call back if she got worse or the symptoms continued longer than 2-3 days.   Her mother verbalized understanding.

## 2021-07-22 ENCOUNTER — Telehealth: Payer: Self-pay

## 2021-07-22 ENCOUNTER — Telehealth (INDEPENDENT_AMBULATORY_CARE_PROVIDER_SITE_OTHER): Payer: Self-pay | Admitting: Family

## 2021-07-22 NOTE — Telephone Encounter (Signed)
  Who's calling (name and relationship to patient) : Karen [name]; mom  Best contact number: [phone]  Provider they see: [facility]  Reason for call: Mom has called in with concerns for [name], mom wants to talk with [name] regarding these concerns and to talk about putting her on Steroids. Mom is really concerned  and feels that its too late in age to develop schizophrenia  Mom has requested a call back asap(Urgently)  PRESCRIPTION REFILL ONLY  Name of prescription:  Pharmacy:

## 2021-07-22 NOTE — Telephone Encounter (Signed)
Pt.'s mother states she needs to talk with Dr. Caryn Section prior to appointment in February. States she "really need some help in the home taking care of Denice. I need to get out and be able to get groceries and different things." Please advise pt.

## 2021-07-22 NOTE — Telephone Encounter (Signed)
Mom called to tell me that Karen [name] was doing fairly well for 2 weeks and was unusually polite. Then her mood changed back to being hostile. Mom asked if treatment with steroids would help with her history of acute metabolic encephalopathy because she saw a program on TV where they talked about it as a treatment. Mom also had questions about schizophrenic diagnosis - she said that she has some schizophrenic behaviors but since she is in her 75's she wonders what other diagnoses she could have since "she is too old to be schizophrenic". Finally Mom says that she needs to go buy dog food but that she doesn't have anyone to stay with [name]. She says that she has no support because one of her sons has a wife with schizophrenia and lives in [place] and her other son lives 7 hours away. Mom says that she is 45 years old and will continue to care for [name] alone but would like to have some help. She says that she has planned with the son that lives 7 hours away for him to care for [name] when Mom passes away or is otherwise unable to do so. I attempted to answer Mom's questions and told her that steroids were not indicated for [facility]. I told her that diagnosis of schizophrenia was uncommon after age 66 but that it was possible. We talked about options to get her help in the community and I strongly urged Mom to get [name] established with psychiatric care. I explained that the problems that she has not psychiatric in nature, and that we in Child Neurology could not effectively treat those problems. Mom agreed and also said that [name] needs pain management. I explained that was not our speciality in child neurology. Mom agreed with recommendations and ended the call. TG

## 2021-07-27 ENCOUNTER — Telehealth: Payer: Self-pay

## 2021-07-27 NOTE — Telephone Encounter (Signed)
Can you fit Ms. Melin in sooner?  Thanks,   -Mickel Baas

## 2021-07-27 NOTE — Telephone Encounter (Signed)
Patient's mother, Karen [name], needs an urgent virtual visit with Dr. Caryn Section to "satisfy medicaid" per mother.    Its also to see if mother can some "in home" help with [name]. She was given a date of 09/28/21 but mother is very upset and unsatisfied with that date as it is so far away. Please let patient mother know when you can see her ASAP.

## 2021-07-30 NOTE — Telephone Encounter (Signed)
Has VV scheduled 08-02-2021

## 2021-08-02 ENCOUNTER — Telehealth (INDEPENDENT_AMBULATORY_CARE_PROVIDER_SITE_OTHER): Payer: Medicare Other | Admitting: Family Medicine

## 2021-08-02 DIAGNOSIS — M549 Dorsalgia, unspecified: Secondary | ICD-10-CM | POA: Diagnosis not present

## 2021-08-02 DIAGNOSIS — F22 Delusional disorders: Secondary | ICD-10-CM

## 2021-08-02 DIAGNOSIS — G8929 Other chronic pain: Secondary | ICD-10-CM | POA: Diagnosis not present

## 2021-08-02 DIAGNOSIS — R102 Pelvic and perineal pain: Secondary | ICD-10-CM | POA: Diagnosis not present

## 2021-08-02 DIAGNOSIS — G40909 Epilepsy, unspecified, not intractable, without status epilepticus: Secondary | ICD-10-CM

## 2021-08-02 DIAGNOSIS — Q796 Ehlers-Danlos syndrome, unspecified: Secondary | ICD-10-CM | POA: Diagnosis not present

## 2021-08-02 DIAGNOSIS — F418 Other specified anxiety disorders: Secondary | ICD-10-CM | POA: Diagnosis not present

## 2021-08-02 DIAGNOSIS — R300 Dysuria: Secondary | ICD-10-CM

## 2021-08-02 DIAGNOSIS — F29 Unspecified psychosis not due to a substance or known physiological condition: Secondary | ICD-10-CM

## 2021-08-02 DIAGNOSIS — M545 Low back pain, unspecified: Secondary | ICD-10-CM | POA: Diagnosis not present

## 2021-08-02 DIAGNOSIS — M069 Rheumatoid arthritis, unspecified: Secondary | ICD-10-CM

## 2021-08-02 MED ORDER — SULFAMETHOXAZOLE-TRIMETHOPRIM 800-160 MG PO TABS: 1.0000 | ORAL_TABLET | Freq: Two times a day (BID) | ORAL | 0 refills | Status: AC

## 2021-08-02 NOTE — Progress Notes (Signed)
MyChart Video Visit    Virtual Visit via Video Note   This visit type was conducted due to national recommendations for restrictions regarding the COVID-19 Pandemic (e.g. social distancing) in an effort to limit this patient's exposure and mitigate transmission in our community. This patient is at least at moderate risk for complications without adequate follow up. This format is felt to be most appropriate for this patient at this time. Physical exam was limited by quality of the video and audio technology used for the visit.   Patient location: home Provider location: bfp  I discussed the limitations of evaluation and management by telemedicine and the availability of in person appointments. The patient expressed understanding and agreed to proceed.  Patient: Karen [name]   DOB: August 23, 1975   45 y.o. Female  MRN: [id number] Visit Date: 08/02/2021  Today's healthcare provider: Lelon Huh, MD   No chief complaint on file.  Subjective    Urinary Tract Infection  There has been no fever. Associated symptoms include frequency and urgency. Pertinent negatives include no flank pain, nausea or vomiting.   Need referral to Home Health Needs referral to Pain Management Pt complains of lower abdomen and back pain.   Patient is lying in her chair not engaging at all in conversation today. Patient's mother states that has been her usually state for the last few months, but whenever she takes that patient to the hospital she interacts with medical staff so no one thinks anything is wrong with her. The patient apparently had Covid several months ago and symptoms began after words.  She was admitted to [facility] behavioral medicine for psychosis for a week in August. Episode was contributed to depression and polypharmacy.  A week after discharge she presented to [facility] ER where she stayed for nearly 2 days, and was found to have UTI which was thought to possibly be contributing to her psychiatric  symptoms. Symptoms apparently resolved while being treated for UTI in ER. She has several more ER visits for hallucinations in October and November, but apparently did not exhibit symptoms severe enough to be admitted during medical evaluation.   Patient's mother also state she is unable to leave [place] alone, but that she has her own medical problems requiring visits to medical facilities. She is in need of assistance with supervision so she can go to her own appointments.   Patient also has long history of chronic back pain and DDD previously seen at pain clinic in another city [facility] mother consider to be a pill mill. She has been on Fentanyl patches with escalating dosages over the last several years, but complains of worsening pain that is no longer controlled.    Medications: Outpatient Medications Prior to Visit  Medication Sig   albuterol (VENTOLIN HFA) 108 (90 Base) MCG/ACT inhaler Inhale 2 puffs into the lungs every 6 (six) hours as needed.   azelastine (OPTIVAR) 0.05 % ophthalmic solution Apply to eye as directed. 1 drop in each eye prn   busPIRone (BUSPAR) 10 MG tablet Take 1 tablet (10 mg total) by mouth 2 (two) times daily. Take along with 15mg  tablet twice a day   busPIRone (BUSPAR) 15 MG tablet TAKE 1 TABLET BY MOUTH 2 TIMES DAILY.   celecoxib (CELEBREX) 200 MG capsule TAKE 1 CAPSULE BY MOUTH TWICE A DAY   cyclobenzaprine (FLEXERIL) 5 MG tablet Take 1 tablet (5 mg total) by mouth 3 (three) times daily as needed for muscle spasms. Do not take within  6 hours of taking tizandine   diclofenac Sodium (VOLTAREN) 1 % GEL APPLY AS NEEDED 3 TIMES A DAY   DULoxetine (CYMBALTA) 60 MG capsule TAKE 2 CAPSULES BY MOUTH EVERY DAY   fentaNYL (DURAGESIC) 100 MCG/HR Place 1 patch onto the skin every 3 (three) days.   fexofenadine (ALLEGRA) 180 MG tablet Take 180 mg by mouth daily.   fluticasone (FLONASE) 50 MCG/ACT nasal spray 2 sprays into each nostril Two (2) times a day. 2 sprays each  nostril BID. DISP# 2 bottles = 1 month supply   fluticasone (FLOVENT HFA) 110 MCG/ACT inhaler Inhale 2 puffs into the lungs 2 (two) times daily.   gabapentin (NEURONTIN) 600 MG tablet TAKE 2 TABLETS IN THE MORNING AND 3 TABLETS AT BEDTIME   haloperidol (HALDOL) 0.5 MG tablet Take 0.5 tablets (0.25 mg total) by mouth 2 (two) times daily.   naloxone (NARCAN) nasal spray 4 mg/0.1 mL Place 1 spray into the nose as needed.   omeprazole (PRILOSEC) 40 MG capsule Take 1 capsule (40 mg total) by mouth 2 (two) times daily.   ondansetron (ZOFRAN-ODT) 4 MG disintegrating tablet DISSOLVE 1 TABLET IN MOUTH EVERY 8 HOURS FOR NAUSEA AND VOMITING   pregabalin (LYRICA) 300 MG capsule TAKE 1 CAPSULE BY MOUTH TWICE A DAY   Sod Picosulfate-Mag Ox-Cit Acd (CLENPIQ) 10-3.5-12 MG-GM -GM/160ML SOLN Take 320 mLs by mouth as directed.   tamsulosin (FLOMAX) 0.4 MG CAPS capsule Take 1 capsule (0.4 mg total) by mouth daily.   tiZANidine (ZANAFLEX) 4 MG tablet TAKE 0.5-1 TABLETS (2-4 MG TOTAL) BY MOUTH EVERY 8 (EIGHT) HOURS AS NEEDED FOR MUSCLE SPASMS.   cephALEXin (KEFLEX) 500 MG capsule Take 1 capsule (500 mg total) by mouth 2 (two) times daily. (Patient not taking: Reported on 06/22/2021)   Fluticasone-Salmeterol (ADVAIR) 250-50 MCG/DOSE AEPB Inhale 1 puff into the lungs every 12 (twelve) hours. Rinse mouth after use (Patient not taking: Reported on 04/27/2021)   haloperidol (HALDOL) 1 MG tablet Take 1 mg by mouth 2 (two) times daily. Patient states she should be and has been on 5 mg twice daily. (Patient not taking: Reported on 06/22/2021)   QUEtiapine (SEROQUEL) 200 MG tablet TAKE 1 TABLET BY MOUTH AT BEDTIME. (Patient not taking: Reported on 08/02/2021)   No facility-administered medications prior to visit.    Review of Systems  Constitutional: Negative.   Gastrointestinal:  Positive for abdominal pain. Negative for abdominal distention, anal bleeding, blood in stool, constipation, diarrhea, nausea, rectal pain and  vomiting.  Genitourinary:  Positive for frequency and urgency. Negative for dysuria, flank pain, menstrual problem, vaginal bleeding, vaginal discharge and vaginal pain.  Musculoskeletal:  Positive for arthralgias.  Neurological:  Negative for dizziness, light-headedness and headaches.  Psychiatric/Behavioral:  Positive for hallucinations.      Objective    There were no vitals taken for this visit.   Physical Exam   Patient is lying in her motorized wheelchair, awake with eyes open, but does not answer questions or acknowledge my questions. This is dramatically different than her baseline in the past which she fully and verbally interact.    Assessment & Plan     1. Delusional disorder ([employer]) Onset after having Covid earlier this summer, and ultimately diagnosed with UTI. Also with long history of depression and polypharmacy. Unclear what the most significant inciting factor is. She is now having some UTI sx as below which will be treating, has appointment with pediatric neurologist later this week and with psychiatry in January  2. Dysuria  - US Pelvis Complete; Future - sulfamethoxazole-trimethoprim (BACTRIM DS) 800-160 MG tablet; Take 1 tablet by mouth 2 (two) times daily for 7 days.  Dispense: 14 tablet; Refill: 0  3. Chronic bilateral back pain, unspecified back location No longer controlled with fentanyl patches. Likely aggravated by her chronic muscle-skeletal disordersand being wheelchair bound.  - Ambulatory referral to Pain Clinic  4. Rheumatoid arthritis, involving unspecified site, unspecified whether rheumatoid factor present [facility])  - Ambulatory referral to Pain Clinic  5. Acute low back pain without sciatica, unspecified back pain laterality  - DG Lumbar Spine Complete; Future - Ambulatory referral to Pain Clinic  6. Pelvic pain  - US Pelvis Complete; Future  7. Seizure disorder [facility])  - Ambulatory referral to [place] Referral to [place]  8. Psychosis, unspecified psychosis type ([place])    9. Ehlers-Danlos syndrome   10. Depression with anxiety Patient's mother is unable to leave [place] alone without supervision, but needs to attend her own medical appointments. She declined PT referral as she states her pain only gets worse with physical therapy.   - AMB Referral to [place]      I discussed the assessment and treatment plan with the patient. The patient was provided an opportunity to ask questions and all were answered. The patient agreed with the plan and demonstrated an understanding of the instructions.   The patient was advised to call back or seek an in-person evaluation if the symptoms worsen or if the condition fails to improve as anticipated.  I provided 16 minutes of non-face-to-face time during this encounter.  The entirety of the information documented in the History of Present Illness, Review of Systems and Physical Exam were personally obtained by me. Portions of this information were initially documented by the CMA and reviewed by me for thoroughness and accuracy.    Lelon Huh, MD [facility] [phone] (phone) [phone] (fax)  [place]

## 2021-08-03 ENCOUNTER — Telehealth: Payer: Self-pay

## 2021-08-03 ENCOUNTER — Telehealth: Payer: Self-pay | Admitting: *Deleted

## 2021-08-03 NOTE — Chronic Care Management (AMB) (Signed)
Chronic Care Management   Note  08/03/2021 Name: Karen [name] MRN: [id number] DOB: Mar 06, 1976  Karen [name] is a 45 y.o. year old female who is a primary care patient of Fisher, Kirstie Peri, MD. I reached out to [name] by phone today in response to a referral sent by Ms. [place] PCP.  [name] was given information about Chronic Care Management services today including:  CCM service includes personalized support from designated clinical staff supervised by her physician, including individualized plan of care and coordination with other care providers 24/7 contact phone numbers for assistance for urgent and routine care needs. Service will only be billed when office clinical staff spend 20 minutes or more in a month to coordinate care. Only one practitioner may furnish and bill the service in a calendar month. The patient may stop CCM services at any time (effective at the end of the month) by phone call to the office staff. The patient is responsible for co-pay (up to 20% after annual deductible is met) if co-pay is required by the individual health plan.   Patient agreed to services and verbal consent obtained.   Follow up plan: Telephone appointment with care management team member scheduled for: 08/17/2021  Julian Hy, [place], [place] Management  Direct Dial: [phone]

## 2021-08-03 NOTE — Telephone Encounter (Signed)
° °  Telephone encounter was:  Unsuccessful.  08/03/2021 Name: Karen [name] MRN: [id number] DOB: December 03, 1975  Unsuccessful outbound call made today to assist with:  Left message on voicemail for patient to return my call regarding [facility] Transportation-[phone].  Outreach Attempt:  1st Attempt  A HIPAA compliant voice message was left requesting a return call.  Instructed patient to call back at [phone].  Semya Klinke, AAS Paralegal, [place] Management  [address], [place] [address] ??[email or web address]   ?? [phone]   [email or web address]

## 2021-08-04 ENCOUNTER — Encounter (INDEPENDENT_AMBULATORY_CARE_PROVIDER_SITE_OTHER): Payer: Self-pay | Admitting: Family

## 2021-08-04 ENCOUNTER — Ambulatory Visit (INDEPENDENT_AMBULATORY_CARE_PROVIDER_SITE_OTHER): Payer: Medicare Other | Admitting: Family

## 2021-08-04 ENCOUNTER — Other Ambulatory Visit: Payer: Self-pay

## 2021-08-04 VITALS — BP 128/78 | HR 96 | Wt 153.4 lb

## 2021-08-04 DIAGNOSIS — G2569 Other tics of organic origin: Secondary | ICD-10-CM

## 2021-08-04 DIAGNOSIS — G44221 Chronic tension-type headache, intractable: Secondary | ICD-10-CM

## 2021-08-04 DIAGNOSIS — Z79899 Other long term (current) drug therapy: Secondary | ICD-10-CM | POA: Diagnosis not present

## 2021-08-04 DIAGNOSIS — Q796 Ehlers-Danlos syndrome, unspecified: Secondary | ICD-10-CM

## 2021-08-05 ENCOUNTER — Other Ambulatory Visit: Payer: Self-pay | Admitting: Family Medicine

## 2021-08-05 NOTE — Telephone Encounter (Signed)
Requested medication (s) are due for refill today - yes  Requested medication (s) are on the active medication list -yes  Future visit scheduled -yes  Last refill: 07/01/21 #10  Notes to clinic: Request RF: non delegated Rx  Requested Prescriptions  Pending Prescriptions Disp Refills   fentaNYL (DURAGESIC) 100 MCG/HR 10 patch 0    Sig: Place 1 patch onto the skin every 3 (three) days.     Not Delegated - Analgesics:  Opioid Agonists Failed - 08/05/2021  3:47 PM      Failed - This refill cannot be delegated      Passed - Urine Drug Screen completed in last 360 days      Passed - Valid encounter within last 6 months    Recent Outpatient Visits           3 days ago Delusional disorder [facility])   [facility] Birdie Sons, MD   4 months ago [place], Donald E, MD   8 months ago Ehlers-Danlos syndrome   [facility] Birdie Sons, MD   1 year ago Sinusitis, unspecified chronicity, unspecified location   [facility] Birdie Sons, MD   1 year ago Rheumatoid arthritis, involving unspecified site, unspecified whether rheumatoid factor present [facility])   [facility] Birdie Sons, MD       Future Appointments             In 1 month Fisher, Kirstie Peri, MD [facility], [facility]   In 6 months MacDiarmid, Nicki Reaper, MD [facility]               Requested Prescriptions  Pending Prescriptions Disp Refills   fentaNYL (DURAGESIC) 100 MCG/HR 10 patch 0    Sig: Place 1 patch onto the skin every 3 (three) days.     Not Delegated - Analgesics:  Opioid Agonists Failed - 08/05/2021  3:47 PM      Failed - This refill cannot be delegated      Passed - Urine Drug Screen completed in last 360 days      Passed - Valid encounter within last 6 months    Recent Outpatient Visits           3 days ago Delusional disorder [facility])   [facility] Birdie Sons, MD   4 months ago [place], Donald E, MD   8 months ago Ehlers-Danlos syndrome   [facility] Birdie Sons, MD   1 year ago Sinusitis, unspecified chronicity, unspecified location   [facility] Birdie Sons, MD   1 year ago Rheumatoid arthritis, involving unspecified site, unspecified whether rheumatoid factor present [facility])   [facility] Birdie Sons, MD       Future Appointments             In 1 month Fisher, Kirstie Peri, MD [facility] [place]   In 6 months [place], Nicki Reaper, [place]

## 2021-08-05 NOTE — Telephone Encounter (Signed)
Medication: fentaNYL ([name]) 100 MCG/HR [[id number]]   Has the patient contacted their pharmacy? NO- Advised to call the office for controlled substances (Agent: If no, request that the patient contact the pharmacy for the refill. If patient does not wish to contact the pharmacy document the reason why and proceed with request.) (Agent: If yes, when and what did the pharmacy advise?)  Preferred Pharmacy (with phone number or street name): [id number] [name], [place] [address] [place] [address] Phone: [phone] Fax: [phone] Hours: Not open 24 hours   Has the patient been seen for an appointment in the last year OR does the patient have an upcoming appointment? YES 09/28/2021  Agent: Please be advised that RX refills may take up to 3 business days. We ask that you follow-up with your pharmacy.

## 2021-08-05 NOTE — Telephone Encounter (Signed)
LOV: 08/02/2021  NOV: None  Last Refill:

## 2021-08-06 MED ORDER — FENTANYL 100 MCG/HR TD PT72: 1.0000 | MEDICATED_PATCH | TRANSDERMAL | 0 refills | Status: DC

## 2021-08-12 ENCOUNTER — Encounter (INDEPENDENT_AMBULATORY_CARE_PROVIDER_SITE_OTHER): Payer: Self-pay | Admitting: Family

## 2021-08-12 ENCOUNTER — Other Ambulatory Visit: Payer: Self-pay | Admitting: Family Medicine

## 2021-08-12 DIAGNOSIS — Z79899 Other long term (current) drug therapy: Secondary | ICD-10-CM | POA: Insufficient documentation

## 2021-08-12 NOTE — Patient Instructions (Signed)
It was a pleasure to see you today!  Instructions for you until your next appointment are as follows: Continue the Haldol as prescribed. Do not take extra doses.  Be sure to follow up with your psychiatrist and your primary doctor. I am concerned about the number and type of medications that you are taking. It is in your best interest for close follow up with these providers.  Please sign up for MyChart if you have not done so. Please plan to return for follow up in 6 months or sooner if needed.    Feel free to contact our office during normal business hours at [phone] with questions or concerns. If there is no answer or the call is outside business hours, please leave a message and our clinic staff will call you back within the next business day.  If you have an urgent concern, please stay on the line for our after-hours answering service and ask for the on-call neurologist.     I also encourage you to use MyChart to communicate with me more directly. If you have not yet signed up for MyChart within [facility], the front desk staff can help you. However, please note that this inbox is NOT monitored on nights or weekends, and response can take up to 2 business days.  Urgent matters should be discussed with the on-call pediatric neurologist.   At [facility], we are committed to providing exceptional care. You will receive a patient satisfaction survey through text or email regarding your visit today. Your opinion is important to me. Comments are appreciated.

## 2021-08-12 NOTE — Progress Notes (Signed)
Karen [name]   MRN:  [id number]  14-May-1976   Provider: Rockwell Germany NP-C Location of Care: [facility]  Visit type: return visit  Last visit: 01/15/2021 with Dr Gaynell Face  Referral source: Lelon Huh, MD History from: Epic chart, patient and her mother  Brief history:  Copied from previous record: She has a chronic pain disorder related to Ehlers-Danlos syndrome with hypermobile joints.  She takes a variety of narcotic and nonnarcotic medications in order to control her pain.  These are prescribed by her primary physician, Dr. Caryn Section of [facility] family [facility].   In addition she has a chronic daily tension headache and has occasional migraines.  Gabapentin has been prescribed and has kept them in good control.   She is followed by pulmonologist Baird Lyons for [facility] because of obstructive and central apnea.  She has a BiPAP machine. She has a delayed circadian rhythm going to bed between 2 and 3 in the morning and sleeping until noon time.   She has excessive daytime sleepiness and problems with focus.  This has been treated with Adderall by her pulmonologist.   As an adult she developed a motor tic disorder.  Currently this involves jerking movements of her legs and elevation of her arms to the elbows toward her shoulders.  She has some vocalizations that sound like a mixture of a sign of moaned that she has had for a number of years. She believes that when she takes excessive amounts of caffeine that that makes her tics worse.     She has bilateral eyelid ptosis.  On occasion particularly later in the day she has closure of her left eyelid and has difficulty opening it. She has nearsightedness.   In addition to these problems, Surya also has a significant problem with mood. She has had recent admissions to the hospital for psychosis, depression and catatonia. Her mother notes that she is paranoid and wonders if she has schizophrenia.    Today's concerns: Gabrielle reports today that her hips are very painful and that nothing gives her relief. Her mother tells me that earlier today Sabreen had hallucinations and was talking to persons that she saw at the end of her bed.   Her mother notes that Tahiri's mood is worse and that she is unable to leave her alone at home. She is frustrated that she is unable to take care of her own health because Smrithi needs constant supervision and monitoring. She tells me that she was only able to get Jairy to come out to this appointment today because her granddaughter came from [place] to help her.   Mom reports that she discovered that Meilani had not been taking her medication correctly for several weeks and that now Mom is administering her medications to her. She wonders if that is why Lakashia's mood worsened recently.  Aleana has been otherwise generally healthy since she was last seen. Neither she nor her mother have other health concerns for her today other than previously mentioned.  Review of systems: Please see HPI for neurologic and other pertinent review of systems. Otherwise all other systems were reviewed and were negative.  Problem List: Patient Active Problem List   Diagnosis Date Noted   Major depressive disorder, recurrent episode, moderate ([place]) 30/94/0768   Acute metabolic encephalopathy 08/81/1031   Asthma 04/22/2021   Paranoid ([place]) 04/22/2021   Psychosis ([place]) 04/22/2021   UTI (urinary tract infection) 04/22/2021   Urinary retention 04/22/2021   Chronic tension  type headache 06/04/2020   Breast lump on left side at 1 o'clock position 10/09/2019   Fibrocystic breast changes, bilateral 10/09/2019   Family history of colon cancer    Rectal polyp    Heartburn    Excessive somnolence disorder 06/28/2017   Seizures ([place]) 09/12/2016   Difficulty hearing 08/31/2015   Back pain, chronic 08/31/2015   Rheumatoid arthritis ([place]) 08/31/2015   Alopecia 03/10/2015    Anemia 03/10/2015   Arthritis 03/10/2015   Allergic asthma, mild persistent, uncomplicated 89/38/1017   Chronic nausea 03/10/2015   DDD (degenerative disc disease), lumbar 03/10/2015   Depression with anxiety 03/10/2015   Personal history of MRSA (methicillin resistant Staphylococcus aureus) 03/10/2015   Insomnia 03/10/2015   Mitral valve prolapse 03/10/2015   Paresthesias/numbness 03/10/2015   Allergic rhinitis, seasonal 03/10/2015   Seizure disorder ([place]) 03/10/2015   Mixed sleep apnea, Central predominant 03/10/2015   Migraine without aura 01/27/2014   Fibromyalgia 01/27/2014   Transient alteration of awareness 01/07/2014   Tics of organic origin 01/07/2014   Backache, unspecified 01/07/2014   Ehlers-Danlos syndrome 01/07/2014     Past Medical History:  Diagnosis Date   Ehlers-Danlos syndrome    Fibromyalgia    Headache    Sleep apnea    Tics of organic origin    Transient alteration of awareness     Past medical history comments: See HPI Copied from previous record: She had 2 head injuries as an adolescent, one of which resulted in a post concussion condition that kept her out of school for a year.  She has a chronic pain syndrome from her Ehlers-Danlos and has been on a variety of medications including Celebrex, tizanidine, Lidoderm patches, fentanyl patches, hydrocodone, Lyrica, gabapentin, and Cymbalta.   She is also had problems with sinusitis, iron deficiency, and chronic lumbar pain.  Other medical problems include osteoarthritis, fibromyalgia, asthma.  She had seizure-like events in the past that were nonepileptic proven by EEGs capturing the behavior without electrographic seizure activity.  Behavior History Anxiety and depression  Surgical history: Past Surgical History:  Procedure Laterality Date   COLONOSCOPY WITH PROPOFOL N/A 02/13/2018   Procedure: COLONOSCOPY WITH PROPOFOL;  Surgeon: [name], MD;  Location: [facility] ENDOSCOPY;  Service: Endoscopy;   Laterality: N/A;   DILATION AND CURETTAGE OF UTERUS  2009   ESOPHAGOGASTRODUODENOSCOPY (EGD) WITH PROPOFOL  02/13/2018   Procedure: ESOPHAGOGASTRODUODENOSCOPY (EGD) WITH PROPOFOL;  Surgeon: [name], MD;  Location: [place] ENDOSCOPY;  Service: Endoscopy;;   EYE SURGERY Left 1990   3 Surgeries on left eye to correct crossed eye   LAPAROSCOPY Left 2003   Fallopian Tube   [place]   3 Surgeries to repair broken arm   OTHER SURGICAL HISTORY Left    3 surgeries on her left thigh as an infant   OTHER SURGICAL HISTORY  1998   Jaw surgery   Right arm surgery  1987   x 3 due to fracture   TOENAIL EXCISION Bilateral 06/13/2019   Procedure: BILATERAL SECOND TOE PARTIAL NAIL ABLATION;  Surgeon: Erle Crocker, MD;  Location: [place];  Service: Orthopedics;  Laterality: Bilateral;  SURGERY REQUEST TIME 1 HOUR   TONSILLECTOMY  12/13/2002     Family history: family history includes Asperger's syndrome in her brother, cousin, maternal aunt, maternal grandfather, and another family member; Asthma in her brother, maternal grandfather, and paternal grandfather; Breast cancer in her maternal aunt; Cancer in her cousin and maternal  aunt; Cervical cancer in her maternal grandmother; Colon cancer in her maternal grandfather; Depression in her brother and mother; Ehlers-Danlos syndrome in her brother and father; Emphysema in her maternal grandfather; Fibromyalgia in her mother; Heart Problems in her paternal grandmother; Heart attack in her paternal grandfather; Heart disease in her paternal aunt and paternal uncle; Migraines in her brother; Osteoarthritis in her maternal grandmother and mother; Osteoporosis in her maternal grandmother; Other in her brother; Prostate cancer in her maternal grandfather; Stroke in her mother; Tuberculosis in her paternal grandfather.   Social history: Social History   Socioeconomic History   Marital status:  Single    Spouse name: Not on file   Number of children: Not on file   Years of education: Not on file   Highest education level: Not on file  Occupational History   Not on file  Tobacco Use   Smoking status: Never   Smokeless tobacco: Never  Vaping Use   Vaping Use: Never used  Substance and Sexual Activity   Alcohol use: No    Alcohol/week: 0.0 standard drinks   Drug use: No   Sexual activity: Never  Other Topics Concern   Not on file  Social History Narrative   Constance is 67.   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    She lives with her mother.    She enjoys reading, watching TV, writing, and painting.    Social Determinants of Health   Financial Resource Strain: Not on file  Food Insecurity: Not on file  Transportation Needs: Not on file  Physical Activity: Not on file  Stress: Not on file  Social Connections: Not on file  Intimate Partner Violence: Not on file    Past/failed meds:  Allergies: Allergies  Allergen Reactions   Augmentin  [Amoxicillin-Pot Clavulanate] Diarrhea   Chocolate Flavor     Other reaction(s): Other (See Comments) Wheezing, acne   Demerol [Meperidine] Other (See Comments)    Slow to wake up when this drug is given.    Keflex [Cephalexin] Nausea And Vomiting   Morphine And Related Nausea And Vomiting   Tape Dermatitis    Must use paper tape only   Toradol [Ketorolac Tromethamine] Nausea And Vomiting   Amoxicillin     Other reaction(s): Unknown   Chocolate     GI distress   Nsaids     patient develops ulcers Other reaction(s): Other (See Comments) patient develops ulcers   Strawberry Extract     GI distress    Immunizations: Immunization History  Administered Date(s) Administered   H1N1 06/03/2008   HPV 9-valent 07/19/2018   Influenza Split 06/14/2012   Influenza,inj,Quad PF,6+ Mos 10/01/2018, 05/26/2020, 07/19/2021   Influenza-Unspecified 05/24/2017, 05/16/2019   Moderna Covid-19 Vaccine Bivalent Booster 26yr & up 07/19/2021    PFIZER(Purple Top)SARS-COV-2 Vaccination 11/05/2019, 11/26/2019, 05/26/2020   Pneumococcal Polysaccharide-23 05/24/2017   Tdap 06/14/2012    Diagnostics/Screenings:  Physical Exam: BP 128/78    Pulse 96    Wt 153 lb 6.4 oz (69.6 kg)    BMI 30.46 kg/m   General: Well developed, well nourished woman, seated reclined in a power wheelchair, in no evident distress Head: Head normocephalic and atraumatic.  Oropharynx benign. Neck: Supple Cardiovascular: Regular rate and rhythm, no murmurs Respiratory: Breath sounds clear to auscultation Musculoskeletal: No obvious deformities or scoliosis. Reports pain with all movements. Skin: No rashes or neurocutaneous lesions  Neurologic Exam Mental Status: Awake and fully alert.  Oriented to place and time.  Recent and remote  memory intact.  Attention span, concentration, and fund of knowledge appropriate. She and her mother argued at times about details of her condition. Cranial Nerves: Fundoscopic exam reveals sharp disc margins.  Pupils equal, briskly reactive to light.  Extraocular movements full without nystagmus. Hearing intact and symmetric to whisper.  Facial sensation intact.  Face tongue, palate move normally and symmetrically. Shoulder shrug normal Motor: Mild diffuse weakness. Good fine motor movements. Sensory: Intact to touch and temperature in all extremities.  Coordination: Finger-to-nose performed accurately bilaterally.   Gait and Station: I did not attempt to get her out of her chair because of her complaints of pain with movements of her hips and legs Reflexes: diminished and symmetric. Toes downgoing.   Impression: Tics of organic origin  Chronic tension-type headache, intractable  Ehlers-Danlos syndrome   Recommendations for plan of care: The patient's previous [facility] records were reviewed. Yarisbel has neither had nor required imaging or lab studies since the last visit, other than what was performed at hospitalizations for  mood disorder. She is a 45 year old woman with chronic pain syndrome, Ehlers-Danlos syndrome, chronic tension headaches and motor tics. She is taking and tolerating Haldol for motor tics and I will make no changes in her medications at this time. I talked with her mother at some length about Lakeita's mood and pain disorder. I am concerned about her polypharmacy and urged caution with medications at home. I talked with her about Marriana's mood disorder and stressed to her that while I am sympathetic, that these problems need to be managed by psychiatry, as that is not my specialty. I will otherwise see her back in follow up in 6 months for her motor tic disorder. Ionna and her mother agreed with the plans made today.   The medication list was reviewed and reconciled. No changes were made in the prescribed medications today. A complete medication list was provided to the patient.  Return in about 6 months (around 02/02/2022).   Allergies as of 08/04/2021       Reactions   Augmentin  [amoxicillin-pot Clavulanate] Diarrhea   Chocolate Flavor    Other reaction(s): Other (See Comments) Wheezing, acne   Demerol [meperidine] Other (See Comments)   Slow to wake up when this drug is given.    Keflex [cephalexin] Nausea And Vomiting   Morphine And Related Nausea And Vomiting   Tape Dermatitis   Must use paper tape only   Toradol [ketorolac Tromethamine] Nausea And Vomiting   Amoxicillin    Other reaction(s): Unknown   Chocolate    GI distress   Nsaids    patient develops ulcers Other reaction(s): Other (See Comments) patient develops ulcers   Strawberry Extract    GI distress        Medication List        Accurate as of August 04, 2021 11:59 PM. If you have any questions, ask your nurse or doctor.          albuterol 108 (90 Base) MCG/ACT inhaler Commonly known as: Ventolin HFA Inhale 2 puffs into the lungs every 6 (six) hours as needed.   azelastine 0.05 % ophthalmic  solution Commonly known as: OPTIVAR Apply to eye as directed. 1 drop in each eye prn   busPIRone 15 MG tablet Commonly known as: BUSPAR TAKE 1 TABLET BY MOUTH 2 TIMES DAILY.   busPIRone 10 MG tablet Commonly known as: BUSPAR Take 1 tablet (10 mg total) by mouth 2 (two) times daily. Take along with  25m tablet twice a day   celecoxib 200 MG capsule Commonly known as: CELEBREX TAKE 1 CAPSULE BY MOUTH TWICE A DAY   cephALEXin 500 MG capsule Commonly known as: KEFLEX Take 1 capsule (500 mg total) by mouth 2 (two) times daily.   Clenpiq 10-3.5-12 MG-GM -GM/160ML Soln Generic drug: Sod Picosulfate-Mag Ox-Cit Acd Take 320 mLs by mouth as directed.   cyclobenzaprine 5 MG tablet Commonly known as: FLEXERIL Take 1 tablet (5 mg total) by mouth 3 (three) times daily as needed for muscle spasms. Do not take within 6 hours of taking tizandine   diclofenac Sodium 1 % Gel Commonly known as: VOLTAREN APPLY AS NEEDED 3 TIMES A DAY   DULoxetine 60 MG capsule Commonly known as: CYMBALTA TAKE 2 CAPSULES BY MOUTH EVERY DAY   fentaNYL 100 MCG/HR Commonly known as: DRipley1 patch onto the skin every 3 (three) days.   fexofenadine 180 MG tablet Commonly known as: ALLEGRA Take 180 mg by mouth daily.   fluticasone 110 MCG/ACT inhaler Commonly known as: FLOVENT HFA Inhale 2 puffs into the lungs 2 (two) times daily.   fluticasone 50 MCG/ACT nasal spray Commonly known as: FLONASE 2 sprays into each nostril Two (2) times a day. 2 sprays each nostril BID. DISP# 2 bottles = 1 month supply   Fluticasone-Salmeterol 250-50 MCG/DOSE Aepb Commonly known as: ADVAIR Inhale 1 puff into the lungs every 12 (twelve) hours. Rinse mouth after use   gabapentin 600 MG tablet Commonly known as: NEURONTIN TAKE 2 TABLETS IN THE MORNING AND 3 TABLETS AT BEDTIME   haloperidol 1 MG tablet Commonly known as: HALDOL Take 1 mg by mouth 2 (two) times daily. Patient states she should be and has been on 5  mg twice daily.   haloperidol 0.5 MG tablet Commonly known as: HALDOL Take 0.5 tablets (0.25 mg total) by mouth 2 (two) times daily.   naloxone 4 MG/0.1ML Liqd nasal spray kit Commonly known as: NARCAN Place 1 spray into the nose as needed.   omeprazole 40 MG capsule Commonly known as: PRILOSEC Take 1 capsule (40 mg total) by mouth 2 (two) times daily.   ondansetron 4 MG disintegrating tablet Commonly known as: ZOFRAN-ODT DISSOLVE 1 TABLET IN MOUTH EVERY 8 HOURS FOR NAUSEA AND VOMITING   pregabalin 300 MG capsule Commonly known as: LYRICA TAKE 1 CAPSULE BY MOUTH TWICE A DAY   QUEtiapine 200 MG tablet Commonly known as: SEROQUEL TAKE 1 TABLET BY MOUTH AT BEDTIME.   sulfamethoxazole-trimethoprim 800-160 MG tablet Commonly known as: BACTRIM DS Take 1 tablet by mouth 2 (two) times daily for 7 days.   tamsulosin 0.4 MG Caps capsule Commonly known as: FLOMAX Take 1 capsule (0.4 mg total) by mouth daily.   tiZANidine 4 MG tablet Commonly known as: ZANAFLEX TAKE 0.5-1 TABLETS (2-4 MG TOTAL) BY MOUTH EVERY 8 (EIGHT) HOURS AS NEEDED FOR MUSCLE SPASMS.      Total time spent with the patient was 30 minutes, of which 50% or more was spent in counseling and coordination of care.  TRockwell GermanyNP-C [facility] Ph. [phone] [phone]

## 2021-08-12 NOTE — Telephone Encounter (Signed)
Medication Refill - Medication: pregabalin (LYRICA) 300 MG capsule. Caller would like medication sent in prior to the weekend   Has the patient contacted their pharmacy? Yes.    (Agent: If yes, when and what did the pharmacy advise?) Contact PCP office and request refill.   Preferred Pharmacy (with phone number or street name):  [id number] [name], [place] Phone:  [phone]  Fax:  [phone]      Has the patient been seen for an appointment in the last year OR does the patient have an upcoming appointment? Yes.    Agent: Please be advised that RX refills may take up to 3 business days. We ask that you follow-up with your pharmacy.

## 2021-08-12 NOTE — Telephone Encounter (Signed)
Requested medication (s) are due for refill today: Yes  Requested medication (s) are on the active medication list: Yes  Last refill:  05/17/21  Future visit scheduled: Yes  Notes to clinic:  Unable to refill per protocol, cannot delegate. Patient would like refill before the weekend.    Requested Prescriptions  Pending Prescriptions Disp Refills   pregabalin (LYRICA) 300 MG capsule 180 capsule 1    Sig: Take 1 capsule (300 mg total) by mouth 2 (two) times daily.     Not Delegated - Neurology:  Anticonvulsants - Controlled Failed - 08/12/2021  4:27 PM      Failed - This refill cannot be delegated      Passed - Valid encounter within last 12 months    Recent Outpatient Visits           1 week ago Delusional disorder [facility])   [facility] Birdie Sons, MD   4 months ago [place], Donald E, MD   8 months ago Ehlers-Danlos syndrome   [facility] Birdie Sons, MD   1 year ago Sinusitis, unspecified chronicity, unspecified location   [facility] Birdie Sons, MD   1 year ago Rheumatoid arthritis, involving unspecified site, unspecified whether rheumatoid factor present [facility])   [facility] Birdie Sons, MD       Future Appointments             In 1 month Fisher, Kirstie Peri, MD [facility], [place]   In 6 months MacDiarmid, Nicki Reaper, [place]

## 2021-08-13 ENCOUNTER — Telehealth: Payer: Self-pay

## 2021-08-13 DIAGNOSIS — R102 Pelvic and perineal pain: Secondary | ICD-10-CM

## 2021-08-13 NOTE — Addendum Note (Signed)
Addended by: Birdie Sons on: 08/13/2021 04:55 PM   Modules accepted: Orders

## 2021-08-13 NOTE — Telephone Encounter (Signed)
Copied from [place] [id number]. Topic: General - Other >> Aug 13, 2021  9:55 AM Parke Poisson wrote: Reason for CRM: [facility] would like to know if there is a reason you are not doing transvaginal ultrasound. It is usually pelvic complete ultrasound with transvaginal

## 2021-08-16 MED ORDER — PREGABALIN 300 MG PO CAPS: 300.0000 mg | ORAL_CAPSULE | Freq: Two times a day (BID) | ORAL | 0 refills | Status: DC

## 2021-08-17 ENCOUNTER — Ambulatory Visit (INDEPENDENT_AMBULATORY_CARE_PROVIDER_SITE_OTHER): Payer: BC Managed Care – PPO | Admitting: *Deleted

## 2021-08-17 DIAGNOSIS — F418 Other specified anxiety disorders: Secondary | ICD-10-CM

## 2021-08-17 DIAGNOSIS — F22 Delusional disorders: Secondary | ICD-10-CM

## 2021-08-17 DIAGNOSIS — F29 Unspecified psychosis not due to a substance or known physiological condition: Secondary | ICD-10-CM

## 2021-08-17 DIAGNOSIS — G40909 Epilepsy, unspecified, not intractable, without status epilepticus: Secondary | ICD-10-CM

## 2021-08-17 NOTE — Patient Instructions (Signed)
Visit Information  Thank you for taking time to visit with me today. Please don't hesitate to contact me if I can be of assistance to you before our next scheduled telephone appointment.  Following are the goals we discussed today:  (Copy and paste patient goals from clinical care plan here)  Our next appointment is by telephone on 08/25/21 at 10am  Please call the care guide team at [phone] if you need to cancel or reschedule your appointment.   If you are experiencing a Mental Health or [place] or need someone to talk to, please call the Suicide and Crisis Lifeline: 988   Following is a copy of your full plan of care:  Care Plan : General Social Work (Adult)  Updates made by [employer], Darla Lesches, LCSW since 08/17/2021 12:00 AM     Problem: CHL AMB "PATIENT-SPECIFIC PROBLEM"   Note:   CARE PLAN ENTRY (see longitudinal plan of care for additional care plan information)  Current Barriers:  Patient with Acute metabolic encephalopathy, Seizure disorder ([place]) and Psychosis, unspecified psychosis type ([place]) in need of assistance with connection to community resources  Knowledge deficits and need for support, education and care coordination related to community resource support  Limited social support, Mental Health Concerns , and Lacks knowledge of community resource: related to patient's supervision needs- Patient's mother is unable to leave patient alone without supervision  Clinical Goal(s)  Over the next 90 days, patient will work with care management team member to address concerns related to patient's supervision needs in the home   Interventions provided by LCSW:  Assessed patient's care coordination needs related to supervision needs in the absence of her parent and discussed ongoing care management follow up  Patient's mother discussed concern that she is unable to leave patient home alone without supervision-confirmed minimal social support to assist Discussed  that in home care in many instances can be an out of pocket expense, however discussed contacted the local management entity [place] for any possible assistance-patient's mother agreeable to contacting [facility] for additional support options Discussed alternative options to increase supervision in the home while parent is away Confirmed appointment with psychiatrist coming up in approximately 2 weeks Collaborated with appropriate clinical care team members regarding patient needs Active listening / Reflection utilized  Assisted patient's mother in problem solving Emotional Support Provided   Patient Self Care Activities & Deficits:  Patient is unable to independently navigate community resource options without care coordination support  Acknowledges deficits and is motivated to resolve concern  Lacks social connections Performs ADL's independently  Initial goal documentation       Ms. Vreeland was given information about Care Management services by the embedded care coordination team including:  Care Management services include personalized support from designated clinical staff supervised by her physician, including individualized plan of care and coordination with other care providers 24/7 contact phone numbers for assistance for urgent and routine care needs. The patient may stop CCM services at any time (effective at the end of the month) by phone call to the office staff.  Patient agreed to services and verbal consent obtained.   The patient verbalized understanding of instructions, educational materials, and care plan provided today and declined offer to receive copy of patient instructions, educational materials, and care plan.   Telephone follow up appointment with care management team member scheduled for: 08/25/21 10am   Dniyah Grant, [place] Worker  [facility] Care Management [phone]

## 2021-08-17 NOTE — Chronic Care Management (AMB) (Signed)
Chronic Care Management    Clinical Social Work Note  08/17/2021 Name: Karen [name] MRN: [id number] DOB: [birth year]  Karen [name] is a 46 y.o. year old female who is a primary care patient of Fisher, Kirstie Peri, MD. The CCM team was consulted to assist the patient with chronic disease management and/or care coordination needs related to: [employer] .   Collaboration with patient's mother by phone  for initial visit in response to provider referral for social work chronic care management and care coordination services.   Consent to Services:  The patient was given the following information about Chronic Care Management services today, agreed to services, and gave verbal consent: 1. CCM service includes personalized support from designated clinical staff supervised by the primary care provider, including individualized plan of care and coordination with other care providers 2. 24/7 contact phone numbers for assistance for urgent and routine care needs. 3. Service will only be billed when office clinical staff spend 20 minutes or more in a month to coordinate care. 4. Only one practitioner may furnish and bill the service in a calendar month. 5.The patient may stop CCM services at any time (effective at the end of the month) by phone call to the office staff. 6. The patient will be responsible for cost sharing (co-pay) of up to 20% of the service fee (after annual deductible is met). Patient agreed to services and consent obtained.  Patient agreed to services and consent obtained.   Assessment: Review of patient past medical history, allergies, medications, and health status, including review of relevant consultants reports was performed today as part of a comprehensive evaluation and provision of chronic care management and care coordination services.     SDOH (Social Determinants of Health) assessments and interventions performed:    Advanced Directives Status: Not addressed in this  encounter.  CCM Care Plan  Allergies  Allergen Reactions   Augmentin  [Amoxicillin-Pot Clavulanate] Diarrhea   Chocolate Flavor     Other reaction(s): Other (See Comments) Wheezing, acne   Demerol [Meperidine] Other (See Comments)    Slow to wake up when this drug is given.    Keflex [Cephalexin] Nausea And Vomiting   Morphine And Related Nausea And Vomiting   Tape Dermatitis    Must use paper tape only   Toradol [Ketorolac Tromethamine] Nausea And Vomiting   Amoxicillin     Other reaction(s): Unknown   Chocolate     GI distress   Nsaids     patient develops ulcers Other reaction(s): Other (See Comments) patient develops ulcers   Strawberry Extract     GI distress    Outpatient Encounter Medications as of 08/17/2021  Medication Sig Note   albuterol (VENTOLIN HFA) 108 (90 Base) MCG/ACT inhaler Inhale 2 puffs into the lungs every 6 (six) hours as needed.    azelastine (OPTIVAR) 0.05 % ophthalmic solution Apply to eye as directed. 1 drop in each eye prn (Patient not taking: Reported on 08/04/2021)    busPIRone (BUSPAR) 10 MG tablet Take 1 tablet (10 mg total) by mouth 2 (two) times daily. Take along with 19m tablet twice a day 04/22/2021: Take along with one 15 mg tablet for total 25 mg twice daily   busPIRone (BUSPAR) 15 MG tablet TAKE 1 TABLET BY MOUTH 2 TIMES DAILY. 04/22/2021: Take along with one 10 mg tablet for total 25 mg twice daily   celecoxib (CELEBREX) 200 MG capsule TAKE 1 CAPSULE BY MOUTH TWICE A DAY  cephALEXin (KEFLEX) 500 MG capsule Take 1 capsule (500 mg total) by mouth 2 (two) times daily. (Patient not taking: Reported on 06/22/2021)    cyclobenzaprine (FLEXERIL) 5 MG tablet Take 1 tablet (5 mg total) by mouth 3 (three) times daily as needed for muscle spasms. Do not take within 6 hours of taking tizandine    diclofenac Sodium (VOLTAREN) 1 % GEL APPLY AS NEEDED 3 TIMES A DAY    DULoxetine (CYMBALTA) 60 MG capsule TAKE 2 CAPSULES BY MOUTH EVERY DAY    fentaNYL  (DURAGESIC) 100 MCG/HR Place 1 patch onto the skin every 3 (three) days.    fexofenadine (ALLEGRA) 180 MG tablet Take 180 mg by mouth daily.    fluticasone (FLONASE) 50 MCG/ACT nasal spray 2 sprays into each nostril Two (2) times a day. 2 sprays each nostril BID. DISP# 2 bottles = 1 month supply (Patient not taking: Reported on 08/04/2021)    fluticasone (FLOVENT HFA) 110 MCG/ACT inhaler Inhale 2 puffs into the lungs 2 (two) times daily.    Fluticasone-Salmeterol (ADVAIR) 250-50 MCG/DOSE AEPB Inhale 1 puff into the lungs every 12 (twelve) hours. Rinse mouth after use (Patient not taking: Reported on 04/27/2021)    gabapentin (NEURONTIN) 600 MG tablet TAKE 2 TABLETS IN THE MORNING AND 3 TABLETS AT BEDTIME    haloperidol (HALDOL) 0.5 MG tablet Take 0.5 tablets (0.25 mg total) by mouth 2 (two) times daily.    haloperidol (HALDOL) 1 MG tablet Take 1 mg by mouth 2 (two) times daily. Patient states she should be and has been on 5 mg twice daily. (Patient not taking: Reported on 06/22/2021)    naloxone [facility]) nasal spray 4 mg/0.1 mL Place 1 spray into the nose as needed. (Patient not taking: Reported on 08/04/2021)    omeprazole (PRILOSEC) 40 MG capsule Take 1 capsule (40 mg total) by mouth 2 (two) times daily.    ondansetron (ZOFRAN-ODT) 4 MG disintegrating tablet DISSOLVE 1 TABLET IN MOUTH EVERY 8 HOURS FOR NAUSEA AND VOMITING    pregabalin (LYRICA) 300 MG capsule Take 1 capsule (300 mg total) by mouth 2 (two) times daily.    QUEtiapine (SEROQUEL) 200 MG tablet TAKE 1 TABLET BY MOUTH AT BEDTIME. (Patient not taking: Reported on 08/02/2021)    Sod Picosulfate-Mag Ox-Cit Acd (CLENPIQ) 10-3.5-12 MG-GM -GM/160ML SOLN Take 320 mLs by mouth as directed. (Patient not taking: Reported on 08/04/2021)    tamsulosin (FLOMAX) 0.4 MG CAPS capsule Take 1 capsule (0.4 mg total) by mouth daily.    tiZANidine (ZANAFLEX) 4 MG tablet TAKE 0.5-1 TABLETS (2-4 MG TOTAL) BY MOUTH EVERY 8 (EIGHT) HOURS AS NEEDED FOR MUSCLE  SPASMS. (Patient not taking: Reported on 08/04/2021)    No facility-administered encounter medications on file as of 08/17/2021.    Patient Active Problem List   Diagnosis Date Noted   Polypharmacy 08/12/2021   Major depressive disorder, recurrent episode, moderate ([place]) 78/58/8502   Acute metabolic encephalopathy 77/41/2878   Asthma 04/22/2021   Paranoid ([place]) 04/22/2021   Psychosis ([place]) 04/22/2021   UTI (urinary tract infection) 04/22/2021   Urinary retention 04/22/2021   Chronic tension type headache 06/04/2020   Breast lump on left side at 1 o'clock position 10/09/2019   Fibrocystic breast changes, bilateral 10/09/2019   Family history of colon cancer    Rectal polyp    Heartburn    Excessive somnolence disorder 06/28/2017   Seizures ([place]) 09/12/2016   Difficulty hearing 08/31/2015   Back pain, chronic 08/31/2015   Rheumatoid arthritis ([place])  08/31/2015   Alopecia 03/10/2015   Anemia 03/10/2015   Arthritis 03/10/2015   Allergic asthma, mild persistent, uncomplicated 39/10/90   Chronic nausea 03/10/2015   DDD (degenerative disc disease), lumbar 03/10/2015   Depression with anxiety 03/10/2015   Personal history of MRSA (methicillin resistant Staphylococcus aureus) 03/10/2015   Insomnia 03/10/2015   Mitral valve prolapse 03/10/2015   Paresthesias/numbness 03/10/2015   Allergic rhinitis, seasonal 03/10/2015   Seizure disorder ([place]) 03/10/2015   Mixed sleep apnea, Central predominant 03/10/2015   Migraine without aura 01/27/2014   Fibromyalgia 01/27/2014   Transient alteration of awareness 01/07/2014   Tics of organic origin 01/07/2014   Backache, unspecified 01/07/2014   Ehlers-Danlos syndrome 01/07/2014    Conditions to be addressed/monitored:   - Acute metabolic encephalopathy   - Seizure disorder (HCC)  - Psychosis, unspecified psychosis type ([place])   Limited social support  Care Plan : General Social Work (Adult)  Updates made by [employer], Darla Lesches, LCSW since  08/17/2021 12:00 AM     Problem: CHL AMB "PATIENT-SPECIFIC PROBLEM"   Note:   CARE PLAN ENTRY (see longitudinal plan of care for additional care plan information)  Current Barriers:  Patient with Acute metabolic encephalopathy, Seizure disorder ([place]) and Psychosis, unspecified psychosis type ([place]) in need of assistance with connection to community resources  Knowledge deficits and need for support, education and care coordination related to community resource support  Limited social support, Mental Health Concerns , and Lacks knowledge of community resource: related to patient's supervision needs- Patient's mother is unable to leave patient alone without supervision  Clinical Goal(s)  Over the next 90 days, patient will work with care management team member to address concerns related to patient's supervision needs in the home   Interventions provided by LCSW:  Assessed patient's care coordination needs related to supervision needs in the absence of her parent and discussed ongoing care management follow up  Patient's mother discussed concern that she is unable to leave patient home alone without supervision-confirmed minimal social support to assist Discussed that in home care in many instances can be an out of pocket expense, however discussed contacting the local management entity [place] for any possible assistance-patient's mother agreeable to contacting [facility] for additional support options Discussed alternative options to increase supervision in the home while parent is away Confirmed appointment with psychiatrist coming up in approximately 2 weeks Collaborated with appropriate clinical care team members regarding patient needs Active listening / Reflection utilized  Assisted patient's mother in problem solving Emotional Support Provided   Patient Self Care Activities & Deficits:  Patient is unable to independently navigate community resource options without  care coordination support  Acknowledges deficits and is motivated to resolve concern  Lacks social connections Performs ADL's independently  Initial goal documentation        Follow Up Plan: SW will follow up with patient by phone over the next 14 business days       [place], [place] Worker  [facility] Care Management [phone]

## 2021-08-20 ENCOUNTER — Telehealth: Payer: Self-pay | Admitting: Family Medicine

## 2021-08-20 DIAGNOSIS — Q796 Ehlers-Danlos syndrome, unspecified: Secondary | ICD-10-CM

## 2021-08-20 DIAGNOSIS — M797 Fibromyalgia: Secondary | ICD-10-CM

## 2021-08-20 NOTE — Telephone Encounter (Signed)
[facility] called and spoke to [place], [facility] about the refill(s) Gabapentin requested. Advised it was sent on 07/19/21 #150/3 refill(s). She says it's on file. She says she spoke to her mom about being on both Lyrica and Gabapentin, wanting to know if this is correct. Her mom said she's on both. I verified she's on both medications. [place] said she will refill gabapentin.

## 2021-08-20 NOTE — Telephone Encounter (Signed)
Copied from [place] [phone]. Topic: Quick Communication - Rx Refill/Question >> Aug 20, 2021  1:45 PM Leward Quan A wrote: Medication: gabapentin (NEURONTIN) 600 MG tablet  Has the patient contacted their pharmacy? Yes.  Per mother pharmacy say no refill on file  (Agent: If no, request that the patient contact the pharmacy for the refill. If patient does not wish to contact the pharmacy document the reason why and proceed with request.) (Agent: If yes, when and what did the pharmacy advise?)  Preferred Pharmacy (with phone number or street name[id number] [address] [place]  Phone:  [phone] Fax:  [phone]    Has the patient been seen for an appointment in the last year OR does the patient have an upcoming appointment? Yes.    Agent: Please be advised that RX refills may take up to 3 business days. We ask that you follow-up with your pharmacy.

## 2021-08-23 ENCOUNTER — Telehealth: Payer: Self-pay

## 2021-08-23 NOTE — Telephone Encounter (Signed)
° °  Telephone encounter was:  Successful.  08/23/2021 Name: Karen [name] MRN: [id number] DOB: [birth year]  Karen [name] is a 46 y.o. year old female who is a primary care patient of Caryn Section, Kirstie Peri, MD . The community resource team was consulted for assistance with Transportation Needs   Care guide performed the following interventions: Spoke with patient's mother Karen [name], she has a wheelchair Printmaker and does not need transportation assistance at this time.     Follow Up Plan:  No further follow up planned at this time. The patient has been provided with needed resources.  Kajuan Guyton, AAS Paralegal, [place] [facility]  [address], [place] [address] ??[email or web address]   ?? [phone]   [email or web address]

## 2021-08-25 ENCOUNTER — Ambulatory Visit: Payer: BC Managed Care – PPO | Admitting: *Deleted

## 2021-08-25 ENCOUNTER — Ambulatory Visit
Admission: RE | Admit: 2021-08-25 | Discharge: 2021-08-25 | Disposition: A | Discharge status: Home / Self Care | Payer: Medicare Other | Source: Ambulatory Visit | Attending: Family Medicine | Admitting: Family Medicine

## 2021-08-25 ENCOUNTER — Other Ambulatory Visit: Payer: Self-pay | Admitting: Family Medicine

## 2021-08-25 ENCOUNTER — Ambulatory Visit
Admission: RE | Admit: 2021-08-25 | Discharge: 2021-08-25 | Disposition: A | Discharge status: Home / Self Care | Payer: Medicare Other | Source: Home / Self Care | Attending: Family Medicine | Admitting: Family Medicine

## 2021-08-25 ENCOUNTER — Telehealth: Payer: Self-pay | Admitting: *Deleted

## 2021-08-25 ENCOUNTER — Other Ambulatory Visit: Payer: Self-pay

## 2021-08-25 DIAGNOSIS — F418 Other specified anxiety disorders: Secondary | ICD-10-CM

## 2021-08-25 DIAGNOSIS — G40909 Epilepsy, unspecified, not intractable, without status epilepticus: Secondary | ICD-10-CM

## 2021-08-25 DIAGNOSIS — R102 Pelvic and perineal pain: Secondary | ICD-10-CM | POA: Insufficient documentation

## 2021-08-25 DIAGNOSIS — M4126 Other idiopathic scoliosis, lumbar region: Secondary | ICD-10-CM | POA: Diagnosis not present

## 2021-08-25 DIAGNOSIS — M545 Low back pain, unspecified: Secondary | ICD-10-CM | POA: Diagnosis not present

## 2021-08-25 DIAGNOSIS — F29 Unspecified psychosis not due to a substance or known physiological condition: Secondary | ICD-10-CM

## 2021-08-25 NOTE — Patient Instructions (Signed)
Visit Information  Thank you for taking time to visit with me today. Please don't hesitate to contact me if I can be of assistance to you before our next scheduled telephone appointment.  Following are the goals we discussed today:   - begin a notebook of services in my neighborhood or community - follow-up on any referrals for help I am given - think ahead to make sure my need does not become an emergency - have a back-up plan  -Follow up with initial appointment with Psychiatrist on 09/09/21 -Follow up with the [facility] for additional service options [place]  Our next appointment is by telephone on 09/08/21 at 10am  Please call the care guide team at [phone] if you need to cancel or reschedule your appointment.   If you are experiencing a Mental Health or [place] or need someone to talk to, please call the Suicide and Crisis Lifeline: 988   The patient verbalized understanding of instructions, educational materials, and care plan provided today and declined offer to receive copy of patient instructions, educational materials, and care plan.   Telephone follow up appointment with care management team member scheduled for: 09/08/21   Elliot Gurney, [place] Worker  [facility] Care Management [phone]

## 2021-08-25 NOTE — Telephone Encounter (Signed)
°  Care Management   Follow Up Note   08/25/2021 Name: Karen [name] MRN: [id number] DOB: Aug 19, 1975   Referred by: Birdie Sons, MD Reason for referral : No chief complaint on file.   An unsuccessful telephone outreach was attempted today. The patient was referred to the case management team for assistance with care management and care coordination.   Follow Up Plan: The care management team will reach out to the patient again over the next 7 days.    Elliot Gurney, [place] Worker  [facility] Care Management [phone]

## 2021-08-25 NOTE — Chronic Care Management (AMB) (Signed)
Chronic Care Management    Clinical Social Work Note  08/25/2021 Name: Karen [name] MRN: [id number] DOB: 1976-03-27  Karen [name] is a 46 y.o. year old female who is a primary care patient of Fisher, Kirstie Peri, MD. The CCM team was consulted to assist the patient with chronic disease management and/or care coordination needs related to: [employer] .   Collaboration with patient's mother  for follow up visit in response to provider referral for social work chronic care management and care coordination services.   Consent to Services:  The patient was given information about Chronic Care Management services, agreed to services, and gave verbal consent prior to initiation of services.  Please see initial visit note for detailed documentation.   Patient agreed to services and consent obtained.   Assessment: Review of patient past medical history, allergies, medications, and health status, including review of relevant consultants reports was performed today as part of a comprehensive evaluation and provision of chronic care management and care coordination services.     SDOH (Social Determinants of Health) assessments and interventions performed:    Advanced Directives Status: Not addressed in this encounter.  CCM Care Plan  Allergies  Allergen Reactions   Augmentin  [Amoxicillin-Pot Clavulanate] Diarrhea   Chocolate Flavor     Other reaction(s): Other (See Comments) Wheezing, acne   Demerol [Meperidine] Other (See Comments)    Slow to wake up when this drug is given.    Keflex [Cephalexin] Nausea And Vomiting   Morphine And Related Nausea And Vomiting   Tape Dermatitis    Must use paper tape only   Toradol [Ketorolac Tromethamine] Nausea And Vomiting   Amoxicillin     Other reaction(s): Unknown   Chocolate     GI distress   Nsaids     patient develops ulcers Other reaction(s): Other (See Comments) patient develops ulcers   Strawberry Extract     GI distress     Outpatient Encounter Medications as of 08/25/2021  Medication Sig Note   albuterol (VENTOLIN HFA) 108 (90 Base) MCG/ACT inhaler Inhale 2 puffs into the lungs every 6 (six) hours as needed.    azelastine (OPTIVAR) 0.05 % ophthalmic solution Apply to eye as directed. 1 drop in each eye prn (Patient not taking: Reported on 08/04/2021)    busPIRone (BUSPAR) 10 MG tablet Take 1 tablet (10 mg total) by mouth 2 (two) times daily. Take along with 15mg  tablet twice a day 04/22/2021: Take along with one 15 mg tablet for total 25 mg twice daily   busPIRone (BUSPAR) 15 MG tablet TAKE 1 TABLET BY MOUTH 2 TIMES DAILY. 04/22/2021: Take along with one 10 mg tablet for total 25 mg twice daily   celecoxib (CELEBREX) 200 MG capsule TAKE 1 CAPSULE BY MOUTH TWICE A DAY    cephALEXin (KEFLEX) 500 MG capsule Take 1 capsule (500 mg total) by mouth 2 (two) times daily. (Patient not taking: Reported on 06/22/2021)    cyclobenzaprine (FLEXERIL) 5 MG tablet Take 1 tablet (5 mg total) by mouth 3 (three) times daily as needed for muscle spasms. Do not take within 6 hours of taking tizandine    diclofenac Sodium (VOLTAREN) 1 % GEL APPLY AS NEEDED 3 TIMES A DAY    DULoxetine (CYMBALTA) 60 MG capsule TAKE 2 CAPSULES BY MOUTH EVERY DAY    fentaNYL (DURAGESIC) 100 MCG/HR Place 1 patch onto the skin every 3 (three) days.    fexofenadine (ALLEGRA) 180 MG tablet Take 180 mg by  mouth daily.    fluticasone (FLONASE) 50 MCG/ACT nasal spray 2 sprays into each nostril Two (2) times a day. 2 sprays each nostril BID. DISP# 2 bottles = 1 month supply (Patient not taking: Reported on 08/04/2021)    fluticasone (FLOVENT HFA) 110 MCG/ACT inhaler Inhale 2 puffs into the lungs 2 (two) times daily.    Fluticasone-Salmeterol (ADVAIR) 250-50 MCG/DOSE AEPB Inhale 1 puff into the lungs every 12 (twelve) hours. Rinse mouth after use (Patient not taking: Reported on 04/27/2021)    gabapentin (NEURONTIN) 600 MG tablet TAKE 2 TABLETS IN THE MORNING AND 3  TABLETS AT BEDTIME    haloperidol (HALDOL) 0.5 MG tablet Take 0.5 tablets (0.25 mg total) by mouth 2 (two) times daily.    haloperidol (HALDOL) 1 MG tablet Take 1 mg by mouth 2 (two) times daily. Patient states she should be and has been on 5 mg twice daily. (Patient not taking: Reported on 06/22/2021)    naloxone [facility]) nasal spray 4 mg/0.1 mL Place 1 spray into the nose as needed. (Patient not taking: Reported on 08/04/2021)    omeprazole (PRILOSEC) 40 MG capsule Take 1 capsule (40 mg total) by mouth 2 (two) times daily.    ondansetron (ZOFRAN-ODT) 4 MG disintegrating tablet DISSOLVE 1 TABLET IN MOUTH EVERY 8 HOURS FOR NAUSEA AND VOMITING    pregabalin (LYRICA) 300 MG capsule Take 1 capsule (300 mg total) by mouth 2 (two) times daily.    QUEtiapine (SEROQUEL) 200 MG tablet TAKE 1 TABLET BY MOUTH AT BEDTIME. (Patient not taking: Reported on 08/02/2021)    Sod Picosulfate-Mag Ox-Cit Acd (CLENPIQ) 10-3.5-12 MG-GM -GM/160ML SOLN Take 320 mLs by mouth as directed. (Patient not taking: Reported on 08/04/2021)    tamsulosin (FLOMAX) 0.4 MG CAPS capsule Take 1 capsule (0.4 mg total) by mouth daily.    tiZANidine (ZANAFLEX) 4 MG tablet TAKE 0.5-1 TABLETS (2-4 MG TOTAL) BY MOUTH EVERY 8 (EIGHT) HOURS AS NEEDED FOR MUSCLE SPASMS. (Patient not taking: Reported on 08/04/2021)    No facility-administered encounter medications on file as of 08/25/2021.    Patient Active Problem List   Diagnosis Date Noted   Polypharmacy 08/12/2021   Major depressive disorder, recurrent episode, moderate ([place]) 74/03/1447   Acute metabolic encephalopathy 18/56/3149   Asthma 04/22/2021   Paranoid ([place]) 04/22/2021   Psychosis ([place]) 04/22/2021   UTI (urinary tract infection) 04/22/2021   Urinary retention 04/22/2021   Chronic tension type headache 06/04/2020   Breast lump on left side at 1 o'clock position 10/09/2019   Fibrocystic breast changes, bilateral 10/09/2019   Family history of colon cancer    Rectal polyp     Heartburn    Excessive somnolence disorder 06/28/2017   Seizures ([place]) 09/12/2016   Difficulty hearing 08/31/2015   Back pain, chronic 08/31/2015   Rheumatoid arthritis ([place]) 08/31/2015   Alopecia 03/10/2015   Anemia 03/10/2015   Arthritis 03/10/2015   Allergic asthma, mild persistent, uncomplicated 70/26/3785   Chronic nausea 03/10/2015   DDD (degenerative disc disease), lumbar 03/10/2015   Depression with anxiety 03/10/2015   Personal history of MRSA (methicillin resistant Staphylococcus aureus) 03/10/2015   Insomnia 03/10/2015   Mitral valve prolapse 03/10/2015   Paresthesias/numbness 03/10/2015   Allergic rhinitis, seasonal 03/10/2015   Seizure disorder ([place]) 03/10/2015   Mixed sleep apnea, Central predominant 03/10/2015   Migraine without aura 01/27/2014   Fibromyalgia 01/27/2014   Transient alteration of awareness 01/07/2014   Tics of organic origin 01/07/2014   Backache, unspecified 01/07/2014   Ehlers-Danlos  syndrome 01/07/2014    Conditions to be addressed/monitored:  - Acute metabolic encephalopathy   - Seizure disorder (HCC)  - Psychosis, unspecified psychosis type ([place])   Limited social support  Care Plan : General Social Work (Adult)  Updates made by [employer], Darla Lesches, LCSW since 08/25/2021 12:00 AM     Problem: CHL AMB "PATIENT-SPECIFIC PROBLEM"   Note:   CARE PLAN ENTRY (see longitudinal plan of care for additional care plan information)  Current Barriers:  Patient with Acute metabolic encephalopathy, Seizure disorder (HCC) and Psychosis, unspecified psychosis type ([place]) in need of assistance with connection to community resources -psychosis continues per patient's mother, patient now not speaking verbally Knowledge deficits and need for support, education and care coordination related to community resource support  Limited social support, Mental Health Concerns , and Lacks knowledge of community resource: related to patient's supervision needs- Patient's  mother is unable to leave patient alone without supervision  Clinical Goal(s)  Over the next 90 days, patient will work with care management team member to address concerns related to patient's supervision needs in the home   Interventions provided by LCSW:  Continued to assessed patient's care coordination needs related to supervision needs in the absence of her parent and discussed ongoing care management follow up  Patient's mother discussed difficulty with getting patient to complete her ADL's and is hopeful that psychiatrist appointment on 09/09/21 is add some insight Patient's mother did confirm limited support from her granddaughter when Doreatha Massed will accompany patient and mother to appointment with psychiatrist on 09/09/21  Continued to discuss contacting the local management entity [place] for any possible assistance-patient's mother agreeable to contacting [facility] for additional support options Active listening / Reflection utilized  Emotional Support Provided  Will follow up with patient's mother following appointment with psychiatrist  Patient Self Care Activities & Deficits:  Patient is unable to independently navigate community resource options without care coordination support  Acknowledges deficits and is motivated to resolve concern  Lacks social connections Performs ADL's independently  Please see past updates related to this goal by clicking on the "Past Updates" button in the selected goal         Follow Up Plan: SW will follow up with patient by phone over the next 14 business days       [place], [place] Worker  [facility] Care Management [phone]

## 2021-09-06 ENCOUNTER — Other Ambulatory Visit: Payer: Self-pay

## 2021-09-06 DIAGNOSIS — R102 Pelvic and perineal pain: Secondary | ICD-10-CM

## 2021-09-07 ENCOUNTER — Telehealth: Payer: Self-pay

## 2021-09-07 ENCOUNTER — Other Ambulatory Visit: Payer: Self-pay | Admitting: Family Medicine

## 2021-09-07 NOTE — Telephone Encounter (Signed)
Copied from [place] [id number]. Topic: Quick Communication - Rx Refill/Question >> Sep 07, 2021  4:00 PM Tessa Lerner A wrote: Medication: fentaNYL ([place]) 100 MCG/HR [[id number]] - patient has 1 patch remaining   Has the patient contacted their pharmacy? No. The patient's mother was uncertain of the refill status  (Agent: If no, request that the patient contact the pharmacy for the refill. If patient does not wish to contact the pharmacy document the reason why and proceed with request.) (Agent: If yes, when and what did the pharmacy advise?)  Preferred Pharmacy (with phone number or street name): [id number] [place], [place]  Phone:  [phone] Fax:  [phone]  Has the patient been seen for an appointment in the last year OR does the patient have an upcoming appointment? Yes.    Agent: Please be advised that RX refills may take up to 3 business days. We ask that you follow-up with your pharmacy.

## 2021-09-07 NOTE — Telephone Encounter (Signed)
Copied from [place] [id number]. Topic: General - Other >> Sep 07, 2021  4:03 PM Tessa Lerner A wrote: Reason for CRM: The patient's mother would like to speak with with Judson Roch when possible regarding a referral   Please contact further when available

## 2021-09-07 NOTE — Telephone Encounter (Signed)
Requested medication (s) are due for refill today: yes  Requested medication (s) are on the active medication list: yes  Last refill:  08/06/21  Future visit scheduled: 09/28/21  Notes to clinic:  This medication can not be delegated, please assess.    Requested Prescriptions  Pending Prescriptions Disp Refills   fentaNYL ([place]) 100 MCG/HR 10 patch 0    Sig: Place 1 patch onto the skin every 3 (three) days.     Not Delegated - Analgesics:  Opioid Agonists Failed - 09/07/2021  4:53 PM      Failed - This refill cannot be delegated      Passed - Urine Drug Screen completed in last 360 days      Passed - Valid encounter within last 6 months    Recent Outpatient Visits           1 month ago Delusional disorder [facility])   [facility] Birdie Sons, MD   5 months ago [place], Donald E, MD   9 months ago Ehlers-Danlos syndrome   [facility] Birdie Sons, MD   1 year ago Sinusitis, unspecified chronicity, unspecified location   [facility] Birdie Sons, MD   1 year ago Rheumatoid arthritis, involving unspecified site, unspecified whether rheumatoid factor present [facility])   [facility] Birdie Sons, MD       Future Appointments             In 3 weeks Fisher, Kirstie Peri, MD [facility], [place]   In 5 months [place], [place]

## 2021-09-08 ENCOUNTER — Telehealth: Payer: Self-pay | Admitting: Psychiatry

## 2021-09-08 ENCOUNTER — Telehealth: Payer: BC Managed Care – PPO

## 2021-09-08 MED ORDER — FENTANYL 100 MCG/HR TD PT72: 1.0000 | MEDICATED_PATCH | TRANSDERMAL | 0 refills | Status: DC

## 2021-09-08 NOTE — Telephone Encounter (Signed)
Patient mother called in stating that the appt tomorrow may need to be changed to a virtual appt due to patient may refuse to come into the office. I informed her that we had not received new patient paperwork, and patient would not be able to be seen virtually if we did not have paperwork. She also wanted to let Dr. Shea Evans know that she has been trying to pour water down her daughters throat so that she would not get dehydrated. From the sounds of everything patient seems to be in a crisis situation, gave the information to [facility] health crisis center.

## 2021-09-09 ENCOUNTER — Ambulatory Visit: Payer: Medicare Other | Admitting: Psychiatry

## 2021-09-09 ENCOUNTER — Other Ambulatory Visit: Payer: Self-pay

## 2021-09-09 ENCOUNTER — Emergency Department
Admission: EM | Admit: 2021-09-09 | Discharge: 2021-09-09 | Disposition: A | Discharge status: Home / Self Care | Payer: Medicare Other | Attending: Emergency Medicine | Admitting: Emergency Medicine

## 2021-09-09 DIAGNOSIS — F22 Delusional disorders: Secondary | ICD-10-CM | POA: Diagnosis not present

## 2021-09-09 DIAGNOSIS — F29 Unspecified psychosis not due to a substance or known physiological condition: Secondary | ICD-10-CM | POA: Insufficient documentation

## 2021-09-09 DIAGNOSIS — E860 Dehydration: Secondary | ICD-10-CM | POA: Diagnosis not present

## 2021-09-09 DIAGNOSIS — R440 Auditory hallucinations: Secondary | ICD-10-CM | POA: Diagnosis present

## 2021-09-09 DIAGNOSIS — R442 Other hallucinations: Secondary | ICD-10-CM | POA: Diagnosis not present

## 2021-09-09 DIAGNOSIS — R441 Visual hallucinations: Secondary | ICD-10-CM | POA: Diagnosis present

## 2021-09-09 LAB — CBC
HCT: 51.4 % — ABNORMAL HIGH (ref 36.0–46.0)
Hemoglobin: 17.1 g/dL — ABNORMAL HIGH (ref 12.0–15.0)
MCH: 27.4 pg (ref 26.0–34.0)
MCHC: 33.3 g/dL (ref 30.0–36.0)
MCV: 82.4 fL (ref 80.0–100.0)
Platelets: 427 10*3/uL — ABNORMAL HIGH (ref 150–400)
RBC: 6.24 MIL/uL — ABNORMAL HIGH (ref 3.87–5.11)
RDW: 14.4 % (ref 11.5–15.5)
WBC: 11.3 10*3/uL — ABNORMAL HIGH (ref 4.0–10.5)
nRBC: 0 % (ref 0.0–0.2)

## 2021-09-09 LAB — ETHANOL: Alcohol, Ethyl (B): <10 mg/dL (ref <10)

## 2021-09-09 LAB — ACETAMINOPHEN LEVEL: Acetaminophen (Tylenol), Serum: <10 ug/mL — ABNORMAL LOW (ref 10–30)

## 2021-09-09 LAB — COMPREHENSIVE METABOLIC PANEL
ALT: 15 U/L (ref 0–44)
AST: 22 U/L (ref 15–41)
Albumin: 4.6 g/dL (ref 3.5–5.0)
Alkaline Phosphatase: 102 U/L (ref 38–126)
Anion gap: 14 (ref 5–15)
BUN: 26 mg/dL — ABNORMAL HIGH (ref 6–20)
CO2: 22 mmol/L (ref 22–32)
Calcium: 9.5 mg/dL (ref 8.9–10.3)
Chloride: 105 mmol/L (ref 98–111)
Creatinine, Ser: 1.07 mg/dL — ABNORMAL HIGH (ref 0.44–1.00)
GFR, Estimated: >60 mL/min (ref >60)
Glucose, Bld: 92 mg/dL (ref 70–99)
Potassium: 3.3 mmol/L — ABNORMAL LOW (ref 3.5–5.1)
Sodium: 141 mmol/L (ref 135–145)
Total Bilirubin: 0.9 mg/dL (ref 0.3–1.2)
Total Protein: 8 g/dL (ref 6.5–8.1)

## 2021-09-09 LAB — SALICYLATE LEVEL: Salicylate Lvl: <7 mg/dL — ABNORMAL LOW (ref 7.0–30.0)

## 2021-09-09 MED ORDER — LACTATED RINGERS IV BOLUS
1000.0000 mL | Freq: Once | INTRAVENOUS | Status: AC
  Administered 2021-09-09: 1000 mL via INTRAVENOUS

## 2021-09-09 MED ORDER — SODIUM CHLORIDE 0.9 % IV BOLUS: 1000.0000 mL | Freq: Once | INTRAVENOUS | Status: DC

## 2021-09-09 NOTE — ED Notes (Signed)
Social worker in with pt and family.  D/c inst to mother.  Pt alert.

## 2021-09-09 NOTE — ED Notes (Signed)
Pt dressed out with this RN and Mel NT. Belongings include: 2 white socks 2 blue socks Green shirt Jeans Purple underwear

## 2021-09-09 NOTE — ED Provider Notes (Signed)
[facility] Provider Note    None    (approximate)  History   Chief Complaint: Not eating/drinking  HPI  Karen [name] is a 46 y.o. female with a past medical history of fibromyalgia, TBI who presents to the emergency department for reported auditory and visual hallucinations as well as not eating/drinking over the past 5 days.  Here the patient is awake alert she will shake her head yes or no to answer questions but is not speaking currently.  Not able to provide any additional history or review of systems.  Physical Exam   Triage Vital Signs: ED Triage Vitals  Enc Vitals Group     BP 09/09/21 0836 (!) 127/96     Pulse Rate 09/09/21 0836 87     Resp 09/09/21 0836 20     Temp 09/09/21 0836 98 F (36.7 C)     Temp src --      SpO2 09/09/21 0836 97 %     Weight 09/09/21 0838 135 lb (61.2 kg)     Height 09/09/21 0838 4\' 11"  (1.499 m)     Head Circumference --      Peak Flow --      Pain Score 09/09/21 0838 5     Pain Loc --      Pain Edu? --      Excl. in [place]? --     Most recent vital signs: Vitals:   09/09/21 0836  BP: (!) 127/96  Pulse: 87  Resp: 20  Temp: 98 F (36.7 C)  SpO2: 97%    General: Awake, no distress.  CV:  Good peripheral perfusion.  Regular rate and rhythm  Resp:  Normal effort.  Equal breath sounds bilaterally.  Abd:  No distention.  Soft, nontender.  No rebound or guarding. Other:  Patient has very dry mucous membranes.  Patient is not speaking but will shake her head yes or no to answer questions.  No distress.   MEDICATIONS ORDERED IN ED: Medications - No data to display   IMPRESSION / MDM / [place] / ED COURSE  I reviewed the triage vital signs and the nursing notes.  Patient presents to the emergency department for evaluation, per report patient has been experiencing auditory and visual hallucinations as well as not eating or drinking over the past 5 days.  Patient does have very dry mucous  membranes looks dehydrated.  Patient is awake alert no distress she will shake her head yes or no to answer questions but is not speaking currently.  We will check labs, have psychiatry evaluate we will IV hydrate while awaiting results.  Differential is quite broad but would include psychiatric manifestations, metabolic or electrolyte abnormalities, dehydration, infectious etiology such as COVID or urinary tract infection.  Mom has arrived.  Mom states the patient has mentioned that she is not talking or eating/drinking because of the little demons in her head are not allowing her to.  We will have psychiatry evaluate.  Lab work has resulted showing somewhat hemoconcentrated given a hemoglobin of 17 likely reflecting dehydration.  Anion gap is borderline elevated at 14, renal function is good mild hypokalemia 3.3.  We will IV hydrate while awaiting psychiatric evaluation.  Urine is pending as well.  Psychiatry has seen the patient I believe the patient is safe for discharge home from their psychiatric standpoint.  They are well aware of the patient states this is her baseline.  From medical standpoint patient has received  fluids appears well.  Patient is her own guardian, we have discussed going to a group home or rehab/nursing facility.  Patient does not want to do this.  She is willing to go home with mom.  We are currently awaiting mom to return to the emergency department from her own medical appointment and will likely discharge.  FINAL CLINICAL IMPRESSION(S) / ED DIAGNOSES   Psychosis Dehydration   Note:  This document was prepared using Dragon voice recognition software and may include unintentional dictation errors.   Harvest Dark, MD 09/09/21 [phone]

## 2021-09-09 NOTE — ED Notes (Signed)
Pt presents to ED with c/o of not eating or drinking for the past 5 days. Pt presents with mother and niece who states pt has not been speaking words over the past few days and states HX of TBI and states this happens sometimes. Mom states they brought pt in due to the fact of being worried for dehydration.  Pt will speak when she wants to but is writing most of what she wants to stay down on paper. Pt first writes  "I refuse treatment" and also writes down "no water or food". Pt will speak when asked to use her words. Pt is A&Ox4 and answers all questions appropriately. Pt does have periods where she does not make sense.   Pt also refuses anything by mouth pt was initially refusing IV fluids but did agree to let this RN place IV and give IVF but refuses any other treatment at this time.   Pt does not have a legal guardian at this time.

## 2021-09-09 NOTE — TOC Initial Note (Signed)
Transition of Care [facility]) - Initial/Assessment Note    Patient Details  Name: Karen [name] MRN: [id number] Date of Birth: June 10, 1976  [facility] CM/SW Contact:    Shelbie Hutching, RN Phone Number: 09/09/2021, 4:20 PM  Clinical Narrative:                 Patient comes into the hospital with psychosis and mother reporting that she has not eaten or been drinking for the past 5 days.  Patient was dehydrated and required IV fluid hydration.  Patient evaluated by psychiatric NP and found to not need inpatient treatment.  Patient will be discharge home today.    RNCM met with patient and mother at the bedside introduced self and explained role.  Mother reports that she is angry with the patient for not eating and not taking her pills and in general being uncooperative.  She reports she wants her placed.  RNCM explained that placement from the emergency department is not in the patient's best interest and that it should be worked on from home with PCP and SW.  Patient and mother agree to [facility] and getting a SW to come out.  Mother agrees with RNCM calling DSS to see if they can offer any assistance in the home.    Patient is bed and wheelchair bound.  Referral accepted by Sharmon Revere with [facility] for [facility] RN, PT, OT, SW.  OP palliative referral given to [facility] with [facility].  RNCM called DSS and made and APS report to hopefully follow up with patient at home for reports of self neglect.    Patient's mother will transport her home via wheelchair van.   Expected Discharge Plan: [place] Barriers to Discharge: Barriers Resolved   Patient Goals and CMS Choice Patient states their goals for this hospitalization and ongoing recovery are:: Patient verbalizes she wants to go home CMS Medicare.gov Compare Post Acute Care list provided to:: Patient Choice offered to / list presented to : Patient, Parent  Expected Discharge Plan and Services Expected Discharge Plan: [place]   Discharge Planning Services: CM Consult Post Acute Care Choice: [place] arrangements for the past 2 months: Single Family Home                 DME Arranged: N/A DME Agency: NA       HH Arranged: RN, PT, OT, Nurse's Aide, Social Work [employer] Agency: [place] Date HH Agency Contacted: 09/09/21 Time [place]: [place] Representative spoke with at [place]: Malachy Mood  Prior Living Arrangements/Services Living arrangements for the past 2 months: [place] Lives with:: Parents Patient language and need for interpreter reviewed:: Yes Do you feel safe going back to the place where you live?: Yes      Need for Family Participation in Patient Care: Yes (Comment) Care giver support system in place?: Yes (comment) Current home services: DME (manuel wheelchair, electric scooter) Criminal Activity/Legal Involvement Pertinent to Current Situation/Hospitalization: No - Comment as needed  Activities of Daily Living      Permission Sought/Granted Permission sought to share information with : Case Manager, Family Supports, Other (comment) Permission granted to share information with : Yes, Verbal Permission Granted  Share Information with NAME: Jacklin Zwick  Permission granted to share info w AGENCY: Home health agency  Permission granted to share info w Relationship: mother  Permission granted to share info w Contact Information: [phone]  Emotional Assessment Appearance:: Appears older than stated age, Disheveled Attitude/Demeanor/Rapport: Guarded, Suspicious Affect (typically observed): Accepting Orientation: : Oriented to Self, Oriented to Place, Oriented to Situation, Oriented to  Time Alcohol / Substance Use: Not Applicable Psych Involvement: Yes (comment), Outpatient Provider  Admission diagnosis:  Psych Eval, EMS Patient Active Problem List   Diagnosis Date Noted   Polypharmacy 08/12/2021   Major  depressive disorder, recurrent episode, moderate ([place]) 15/11/1362   Acute metabolic encephalopathy 38/37/7939   Asthma 04/22/2021   Paranoid ([place]) 04/22/2021   Psychosis ([place]) 04/22/2021   UTI (urinary tract infection) 04/22/2021   Urinary retention 04/22/2021   Chronic tension type headache 06/04/2020   Breast lump on left side at 1 o'clock position 10/09/2019   Fibrocystic breast changes, bilateral 10/09/2019   Family history of colon cancer    Rectal polyp    Heartburn    Excessive somnolence disorder 06/28/2017   Seizures ([place]) 09/12/2016   Difficulty hearing 08/31/2015   Back pain, chronic 08/31/2015   Rheumatoid arthritis ([place]) 08/31/2015   Alopecia 03/10/2015   Anemia 03/10/2015   Arthritis 03/10/2015   Allergic asthma, mild persistent, uncomplicated 68/86/4847   Chronic nausea 03/10/2015   DDD (degenerative disc disease), lumbar 03/10/2015   Depression with anxiety 03/10/2015   Personal history of MRSA (methicillin resistant Staphylococcus aureus) 03/10/2015   Insomnia 03/10/2015   Mitral valve prolapse 03/10/2015   Paresthesias/numbness 03/10/2015   Allergic rhinitis, seasonal 03/10/2015   Seizure disorder ([place]) 03/10/2015   Mixed sleep apnea, [place] predominant 03/10/2015   Migraine without aura 01/27/2014   Fibromyalgia 01/27/2014   Transient alteration of awareness 01/07/2014   Tics of organic origin 01/07/2014   Backache, unspecified 01/07/2014   Ehlers-Danlos syndrome 01/07/2014   PCP:  Birdie Sons, MD Pharm[facility]:   [name]: [phone]: [phone] [facility] [address] [place]Phone: [phone]: [phone] [id number] [place] [address] [place]: [phone]: [phone]   Social Determinants of Health  (SDOH) Interventions    Readmission Risk Interventions No flowsheet data found.

## 2021-09-09 NOTE — ED Triage Notes (Signed)
Pt comes into the ED via EMS from home , mother states the pt has a hx of brain trauma, states she has been having auditory and visual hallucinations and has not been eating in the past 5 days.

## 2021-09-09 NOTE — ED Triage Notes (Signed)
See previous note. Pt following commands, makes eye contact when spoken too, is not speaking.  NAD noted

## 2021-09-09 NOTE — [place] Notes (Addendum)
[facility]) Hospital Liaison Note  Notified by Doran Clay, RN [facility] manager of patient/family request for [facility] Palliative services at home after discharge.  [facility] hospital liaison will follow patient for discharge disposition.  Please call with any hospice or outpatient palliative care related questions.  Thank you for the opportunity to participate in this patient's care.  Nadene Rubins, RN, BSN [place] [phone]

## 2021-09-09 NOTE — ED Notes (Signed)
Pt mother in waiting area. States  there are "little people in her head keeping her from eating, drink and talking for a week". Reports pt has outpatient psychiatrist visit in medical mall today and wants to know why we cant speak to psychiatrist right now.    Pt visualized speaking on mothers phone.

## 2021-09-09 NOTE — Consult Note (Signed)
[facility] Face-to-Face Psychiatry Consult   Reason for Consult:  patient not eating/drinking. [place] Referring Physician:  [place]owski Patient Identification: Karen [name] MRN:  [id number] Principal Diagnosis: Paranoia (psychosis) ([place]) Diagnosis:  Principal Problem:   Paranoia (psychosis) ([place]) Active Problems:   Psychosis ([place])   Total Time spent with patient: 1 hour  Subjective:   Karen [name] is a 46 y.o. female patient admitted with medication non-compliance.  HPI:  Patient had an appointment with  a psychiatrist this morning that mother states took 3 months to get, but she was unable to get her there because "I could not get her out of bed." Mother elected to call EMS to bring patient to ED so she could "talk to a psychiatrist."  Mother (who is her caregiver), states that patient has not had anything to eat or drink in 5 days, although patient has been able to get in her wheelchair and may indeed be getting something to eat or drink. She has refused her medications, including fentanyl patch.  Patient seen face-to-face and chart reviewed. Patient is not in acute distress. She presents with chronic paranoia of thinking that her medication  "is not the right ones." She is unable to be convinced that this is not the case.  Patient has an old patch on and certainly does not appear to be in pain, refuses to answer if she has pain. Writer evaluated patient without presence of her mother. Patient states that "voices tell her not to take the meds because the pharmacist gave her the wrong ones." She says she needs to be in the hospital to get the right meds. Patient has presented to ED with similar complaints in the past. She is not acutely psychotic and does not meet criteria for IVC for psychiatric inpatient treatment.  Discussed with EDP  Patient is her own guardian and makes her own decisions regarding healthcare. Apparently mother does not have POA for healthcare decisions. Patient is adamant  that she will not allow people to make her decisions for her, but she is unable to care for herself. Mother states that she is conferring with patient's physician about having home health and nursing care.   Past Psychiatric History: Depression; anxiety; paranoia Risk to Self:   Risk to Others:   Prior Inpatient Therapy:   Prior Outpatient Therapy:    Past Medical History:  Past Medical History:  Diagnosis Date   Ehlers-Danlos syndrome    Fibromyalgia    Headache    Sleep apnea    Tics of organic origin    Transient alteration of awareness     Past Surgical History:  Procedure Laterality Date   COLONOSCOPY WITH PROPOFOL N/A 02/13/2018   Procedure: COLONOSCOPY WITH PROPOFOL;  Surgeon: [name], MD;  Location: [facility] ENDOSCOPY;  Service: Endoscopy;  Laterality: N/A;   DILATION AND CURETTAGE OF UTERUS  2009   ESOPHAGOGASTRODUODENOSCOPY (EGD) WITH PROPOFOL  02/13/2018   Procedure: ESOPHAGOGASTRODUODENOSCOPY (EGD) WITH PROPOFOL;  Surgeon: [name], MD;  Location: [facility] ENDOSCOPY;  Service: Endoscopy;;   EYE SURGERY Left 1990   3 Surgeries on left eye to correct crossed eye   LAPAROSCOPY Left 2003   Fallopian Tube   [place] HISTORY Right 1987   3 Surgeries to repair broken arm   OTHER SURGICAL HISTORY Left    3 surgeries on her left thigh as an infant   OTHER SURGICAL HISTORY  1998   Jaw surgery   Right arm surgery  1987   x 3 due to fracture   TOENAIL EXCISION Bilateral 06/13/2019   Procedure: BILATERAL SECOND TOE PARTIAL NAIL ABLATION;  Surgeon: Erle Crocker, MD;  Location: [facility];  Service: Orthopedics;  Laterality: Bilateral;  SURGERY REQUEST TIME 1 HOUR   TONSILLECTOMY  12/13/2002   Family History:  Family History  Problem Relation Age of Onset   Depression Mother    Stroke Mother    Fibromyalgia Mother    Osteoarthritis Mother    Asthma Brother    Depression Brother    Migraines Brother    Asperger's  syndrome Brother    Other Brother        Ehlers-Danlos Syndrome   Cancer Maternal Aunt    Asperger's syndrome Maternal Aunt    Breast cancer Maternal Aunt    Heart disease Paternal Aunt    Heart disease Paternal Uncle    Cervical cancer Maternal Grandmother    Osteoporosis Maternal Grandmother        Died at 86   Osteoarthritis Maternal Grandmother    Colon cancer Maternal Grandfather    Asthma Maternal Grandfather    Asperger's syndrome Maternal Grandfather    Emphysema Maternal Grandfather        Died at 67   Prostate cancer Maternal Grandfather    Heart attack Paternal Grandfather        Died at 56   Asthma Paternal Grandfather    Tuberculosis Paternal Grandfather    Cancer Cousin    Asperger's syndrome Cousin    Asperger's syndrome Other    Heart Problems Paternal Grandmother        Died at 27   Ehlers-Danlos syndrome Father    Ehlers-Danlos syndrome Brother    Family Psychiatric  History: unknown Social History:  Social History   Substance and Sexual Activity  Alcohol Use No   Alcohol/week: 0.0 standard drinks     Social History   Substance and Sexual Activity  Drug Use No    Social History   Socioeconomic History   Marital status: Single    Spouse name: Not on file   Number of children: Not on file   Years of education: Not on file   Highest education level: Not on file  Occupational History   Not on file  Tobacco Use   Smoking status: Never   Smokeless tobacco: Never  Vaping Use   Vaping Use: Never used  Substance and Sexual Activity   Alcohol use: No    Alcohol/week: 0.0 standard drinks   Drug use: No   Sexual activity: Never  Other Topics Concern   Not on file  Social History Narrative   Karen [name] is 65.   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    She lives with her mother.    She enjoys reading, watching TV, writing, and painting.    Social Determinants of Health   Financial Resource Strain: Not on file  Food Insecurity: Not on file   Transportation Needs: Not on file  Physical Activity: Not on file  Stress: Not on file  Social Connections: Not on file   Additional Social History:    Allergies:   Allergies  Allergen Reactions   Augmentin  [Amoxicillin-Pot Clavulanate] Diarrhea   Chocolate Flavor     Other reaction(s): Other (See Comments) Wheezing, acne   Demerol [Meperidine] Other (See Comments)    Slow to wake up when this drug is given.    Keflex [Cephalexin] Nausea And Vomiting  Morphine And Related Nausea And Vomiting   Tape Dermatitis    Must use paper tape only   Toradol [Ketorolac Tromethamine] Nausea And Vomiting   Amoxicillin     Other reaction(s): Unknown   Chocolate     GI distress   Nsaids     patient develops ulcers Other reaction(s): Other (See Comments) patient develops ulcers   Strawberry Extract     GI distress    Labs:  Results for orders placed or performed during the hospital encounter of 09/09/21 (from the past 48 hour(s))  Comprehensive metabolic panel     Status: Abnormal   Collection Time: 09/09/21  8:40 AM  Result Value Ref Range   Sodium 141 135 - 145 mmol/L   Potassium 3.3 (L) 3.5 - 5.1 mmol/L   Chloride 105 98 - 111 mmol/L   CO2 22 22 - 32 mmol/L   Glucose, Bld 92 70 - 99 mg/dL    Comment: Glucose reference range applies only to samples taken after fasting for at least 8 hours.   BUN 26 (H) 6 - 20 mg/dL   Creatinine, Ser 1.07 (H) 0.44 - 1.00 mg/dL   Calcium 9.5 8.9 - 10.3 mg/dL   Total Protein 8.0 6.5 - 8.1 g/dL   Albumin 4.6 3.5 - 5.0 g/dL   AST 22 15 - 41 U/L   ALT 15 0 - 44 U/L   Alkaline Phosphatase 102 38 - 126 U/L   Total Bilirubin 0.9 0.3 - 1.2 mg/dL   GFR, Estimated >60 >60 mL/min    Comment: (NOTE) Calculated using the CKD-EPI Creatinine Equation (2021)    Anion gap 14 5 - 15    Comment: Performed at [facility], [address] [place], [place] [address]  Ethanol     Status: None   Collection Time: 09/09/21  8:40 AM  Result Value Ref  Range   Alcohol, Ethyl (B) <10 <10 mg/dL    Comment: (NOTE) Lowest detectable limit for serum alcohol is 10 mg/dL.  For medical purposes only. Performed at [facility], [address] [place], [place] [address]   Salicylate level     Status: Abnormal   Collection Time: 09/09/21  8:40 AM  Result Value Ref Range   Salicylate Lvl <5.2 (L) 7.0 - 30.0 mg/dL    Comment: Performed at [facility], [address] [place], [place] [address]  Acetaminophen level     Status: Abnormal   Collection Time: 09/09/21  8:40 AM  Result Value Ref Range   Acetaminophen (Tylenol), Serum <10 (L) 10 - 30 ug/mL    Comment: (NOTE) Therapeutic concentrations vary significantly. A range of 10-30 ug/mL  may be an effective concentration for many patients. However, some  are best treated at concentrations outside of this range. Acetaminophen concentrations >150 ug/mL at 4 hours after ingestion  and >50 ug/mL at 12 hours after ingestion are often associated with  toxic reactions.  Performed at [facility], [address] [place], [place] [address]   cbc     Status: Abnormal   Collection Time: 09/09/21  8:40 AM  Result Value Ref Range   WBC 11.3 (H) 4.0 - 10.5 K/uL   RBC 6.24 (H) 3.87 - 5.11 MIL/uL   Hemoglobin 17.1 (H) 12.0 - 15.0 g/dL   HCT 51.4 (H) 36.0 - 46.0 %   MCV 82.4 80.0 - 100.0 fL   MCH 27.4 26.0 - 34.0 pg   MCHC 33.3 30.0 - 36.0 g/dL  RDW 14.4 11.5 - 15.5 %   Platelets 427 (H) 150 - 400 K/uL   nRBC 0.0 0.0 - 0.2 %    Comment: Performed at [facility], [address] [place], [place] [address]    No current facility-administered medications for this encounter.   Current Outpatient Medications  Medication Sig Dispense Refill   albuterol (VENTOLIN HFA) 108 (90 Base) MCG/ACT inhaler Inhale 2 puffs into the lungs every 6 (six) hours as needed. 8 g 12   azelastine (OPTIVAR) 0.05 % ophthalmic solution Apply to eye as directed. 1 drop in each eye  prn (Patient not taking: Reported on 08/04/2021)     busPIRone (BUSPAR) 10 MG tablet Take 1 tablet (10 mg total) by mouth 2 (two) times daily. Take along with 15mg  tablet twice a day 180 tablet 4   busPIRone (BUSPAR) 15 MG tablet TAKE 1 TABLET BY MOUTH 2 TIMES DAILY. 180 tablet 2   celecoxib (CELEBREX) 200 MG capsule TAKE 1 CAPSULE BY MOUTH TWICE A DAY 60 capsule 5   cephALEXin (KEFLEX) 500 MG capsule Take 1 capsule (500 mg total) by mouth 2 (two) times daily. (Patient not taking: Reported on 06/22/2021) 14 capsule 0   cyclobenzaprine (FLEXERIL) 5 MG tablet Take 1 tablet (5 mg total) by mouth 3 (three) times daily as needed for muscle spasms. Do not take within 6 hours of taking tizandine 30 tablet 2   diclofenac Sodium (VOLTAREN) 1 % GEL APPLY AS NEEDED 3 TIMES A DAY 100 g 0   DULoxetine (CYMBALTA) 60 MG capsule TAKE 2 CAPSULES BY MOUTH EVERY DAY 180 capsule 3   fentaNYL (DURAGESIC) 100 MCG/HR Place 1 patch onto the skin every 3 (three) days. 10 patch 0   fexofenadine (ALLEGRA) 180 MG tablet Take 180 mg by mouth daily.     fluticasone (FLONASE) 50 MCG/ACT nasal spray 2 sprays into each nostril Two (2) times a day. 2 sprays each nostril BID. DISP# 2 bottles = 1 month supply (Patient not taking: Reported on 08/04/2021)     fluticasone (FLOVENT HFA) 110 MCG/ACT inhaler Inhale 2 puffs into the lungs 2 (two) times daily.     Fluticasone-Salmeterol (ADVAIR) 250-50 MCG/DOSE AEPB Inhale 1 puff into the lungs every 12 (twelve) hours. Rinse mouth after use (Patient not taking: Reported on 04/27/2021) 60 each 11   gabapentin (NEURONTIN) 600 MG tablet TAKE 2 TABLETS IN THE MORNING AND 3 TABLETS AT BEDTIME 150 tablet 3   haloperidol (HALDOL) 0.5 MG tablet Take 0.5 tablets (0.25 mg total) by mouth 2 (two) times daily. 60 tablet 3   haloperidol (HALDOL) 1 MG tablet Take 1 mg by mouth 2 (two) times daily. Patient states she should be and has been on 5 mg twice daily. (Patient not taking: Reported on 06/22/2021)      naloxone [facility]) nasal spray 4 mg/0.1 mL Place 1 spray into the nose as needed. (Patient not taking: Reported on 08/04/2021)     omeprazole (PRILOSEC) 40 MG capsule Take 1 capsule (40 mg total) by mouth 2 (two) times daily. 60 capsule 6   ondansetron (ZOFRAN-ODT) 4 MG disintegrating tablet DISSOLVE 1 TABLET IN MOUTH EVERY 8 HOURS FOR NAUSEA AND VOMITING 30 tablet 5   pregabalin (LYRICA) 300 MG capsule Take 1 capsule (300 mg total) by mouth 2 (two) times daily. 180 capsule 0   QUEtiapine (SEROQUEL) 200 MG tablet TAKE 1 TABLET BY MOUTH AT BEDTIME. (Patient not taking: Reported on 08/02/2021) 90 tablet 5   Sod Picosulfate-Mag Ox-Cit  Acd (CLENPIQ) 10-3.5-12 MG-GM -GM/160ML SOLN Take 320 mLs by mouth as directed. (Patient not taking: Reported on 08/04/2021) 320 mL 0   tamsulosin (FLOMAX) 0.4 MG CAPS capsule Take 1 capsule (0.4 mg total) by mouth daily. 90 capsule 3   tiZANidine (ZANAFLEX) 4 MG tablet TAKE 0.5-1 TABLETS (2-4 MG TOTAL) BY MOUTH EVERY 8 (EIGHT) HOURS AS NEEDED FOR MUSCLE SPASMS. (Patient not taking: Reported on 08/04/2021) 270 tablet 1    Musculoskeletal: Strength & Muscle Tone: decreased Gait & Station:  did not observe Patient leans: N/A    Psychiatric Specialty Exam:  Presentation  General Appearance: Disheveled  Eye Contact:Good  Speech:Normal Rate  Speech Volume:Normal  Handedness:Right   Mood and Affect  Mood:Irritable  Affect:Blunt   Thought Process  Thought Processes:Disorganized  Descriptions of Associations:Loose  Orientation:Full (Time, Place and Person)  Thought Content:Illogical  History of Schizophrenia/Schizoaffective disorder:No  Duration of Psychotic Symptoms:Less than six months  Hallucinations:Hallucinations: Other (comment) (denies)  Ideas of Reference:Paranoia  Suicidal Thoughts:Suicidal Thoughts: No  Homicidal Thoughts:Homicidal Thoughts: No   Sensorium  Memory:Immediate Poor  Judgment:Poor  Insight:Poor   Executive  Functions  Concentration:Fair  Attention Span:Fair  [place]   Psychomotor Activity  Psychomotor Activity:No data recorded  Assets  Assets:Financial Resources/Insurance; Housing; Social Support; Resilience   Sleep  Sleep:No data recorded  Physical Exam: Physical Exam Vitals and nursing note reviewed.  HENT:     Head: Normocephalic.     Nose: No congestion or rhinorrhea.  Eyes:     General:        Right eye: No discharge.        Left eye: No discharge.  Cardiovascular:     Rate and Rhythm: Normal rate.  Pulmonary:     Effort: Pulmonary effort is normal.  Skin:    General: Skin is dry.  Neurological:     Mental Status: She is alert and oriented to person, place, and time.  Psychiatric:        Attention and Perception: Attention normal.        Mood and Affect: Affect is blunt.        Speech: Speech is tangential.        Behavior: Behavior is uncooperative.        Thought Content: Thought content is paranoid.        Cognition and Memory: Cognition is impaired.        Judgment: Judgment is impulsive.   Review of Systems  Musculoskeletal:        Wheelchair bound  Neurological:  Positive for weakness (related to medical condition).  Psychiatric/Behavioral:  Positive for hallucinations. Negative for depression, memory loss, substance abuse and suicidal ideas. The patient is not nervous/anxious and does not have insomnia.   All other systems reviewed and are negative. Blood pressure (!) 127/96, pulse 87, temperature 98 F (36.7 C), resp. rate 20, height 4\' 11"  (1.499 m), weight 61.2 kg, SpO2 97 %. Body mass index is 27.27 kg/m.  Treatment Plan Summary: Plan Patient does not present as though in imminent danger  to self or others. Patient presents to ED with paranoid thoughts that she is not getting her medication properly. Patient had appointment with psychiatrist that mother states she was unable to keep this morning and  decided to bring her to the ED instead.Patient is her own guardian and refuses treatment. Patient states she will not take antipsychotic. These complaints are frequently the ones that patient presents with. Discussed with mother  it is imperative that patient get connected with outpatient psychiatry to regulate her medications, possible that she could get LAI antipsychotic.Marland Kitchen Also recommend home health.Patient does not meet criteria for  IVC for inpatient psychiatric care.     Disposition: No evidence of imminent risk to self or others at present.   Supportive therapy provided about ongoing stressors. Discussed crisis plan, support from social network, calling 911, coming to the Emergency Department, and calling Suicide Hotline.  Sherlon Handing, NP 09/09/2021 6:11 PM

## 2021-09-09 NOTE — ED Notes (Signed)
This RN went to assess pt and found pt had taken IV out, will notify MD.

## 2021-09-12 ENCOUNTER — Other Ambulatory Visit: Payer: Self-pay | Admitting: Family Medicine

## 2021-09-12 IMAGING — CT CT HEAD W/O CM
3 of 4 series · 14 of 47 positions shown, 16 images · non-contrast
Comparison: Paranasal sinus CT 9443.

CLINICAL DATA: 45-year-old female with altered mental status.
History of Ehlers-Danlos syndrome,

EXAM:
CT HEAD WITHOUT CONTRAST
TECHNIQUE: Contiguous axial images were obtained from the base of the skull
through the vertex without intravenous contrast.

[Series 4: head wo · axial · 0.33mm/px · z∈[-130,-1]mm · 8 of 32 slices shown, 10 images]
[im 3/32  brain]
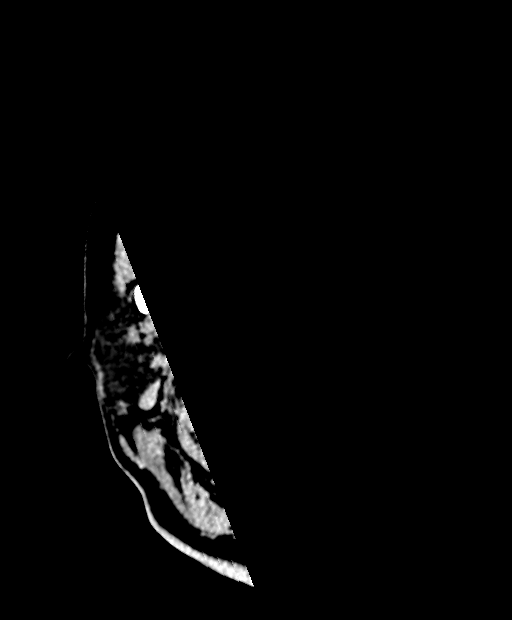
[im 3/32  bone]
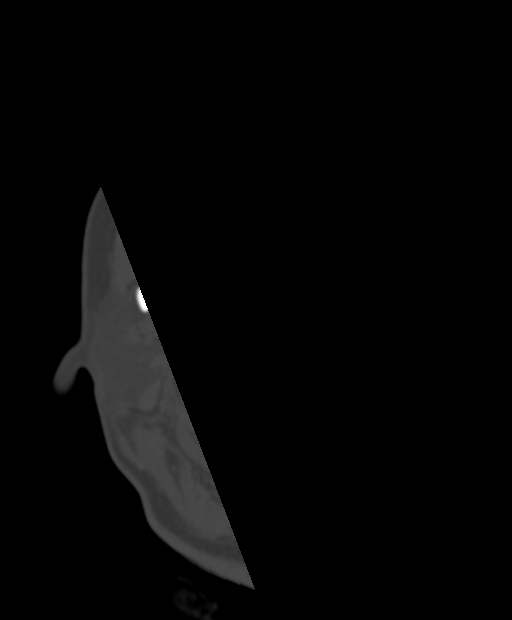
[im 7/32  brain]
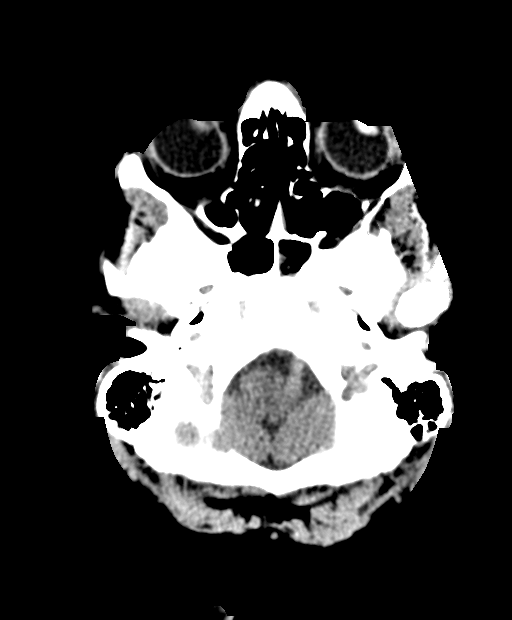
[im 11/32  brain]
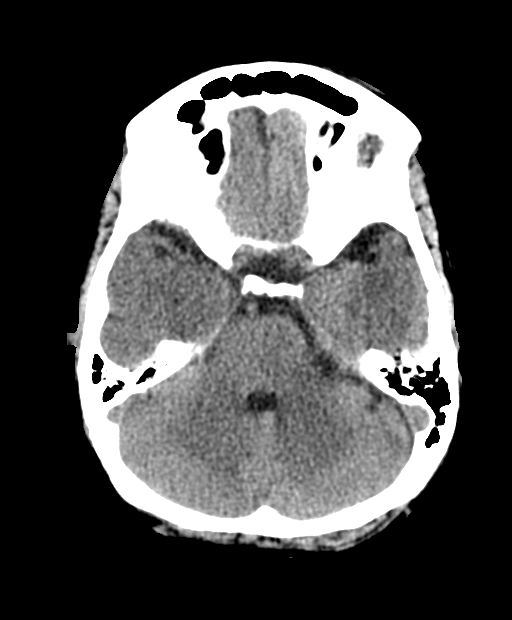
[im 15/32  brain]
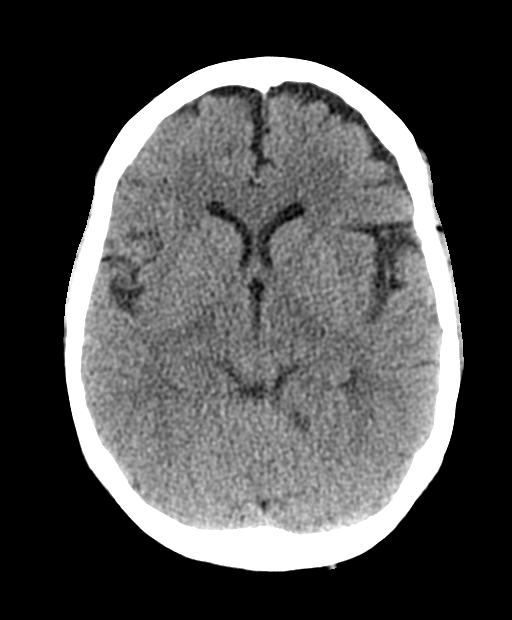
[im 17/32  brain]
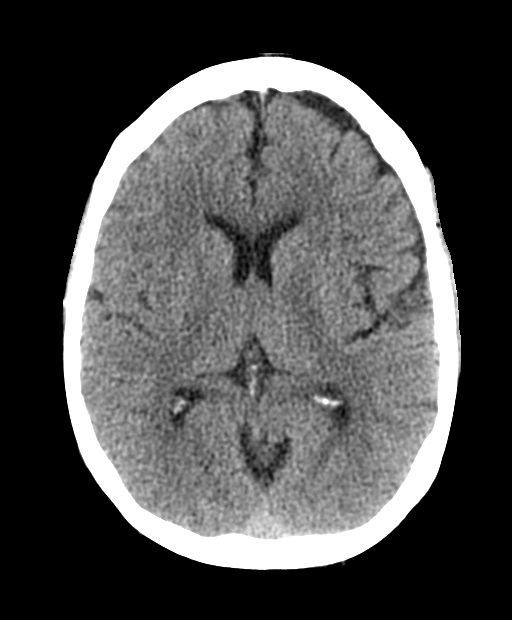
[im 17/32  bone]
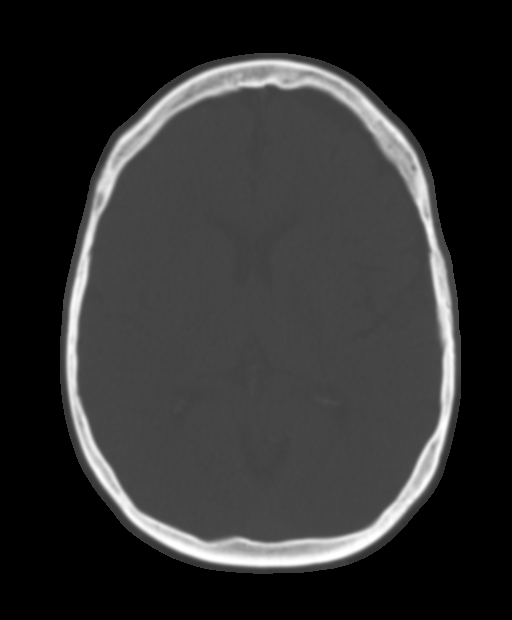
[im 21/32  brain]
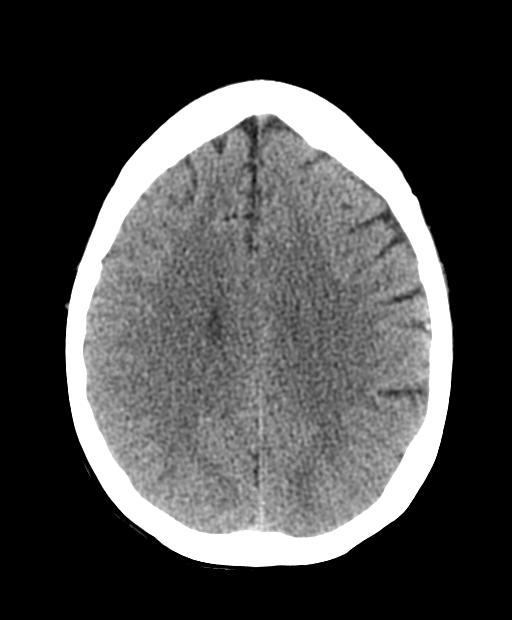
[im 25/32  brain]
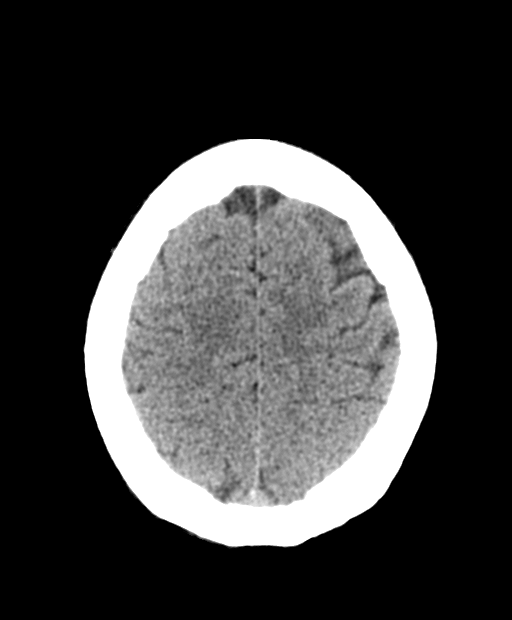
[im 29/32  brain]
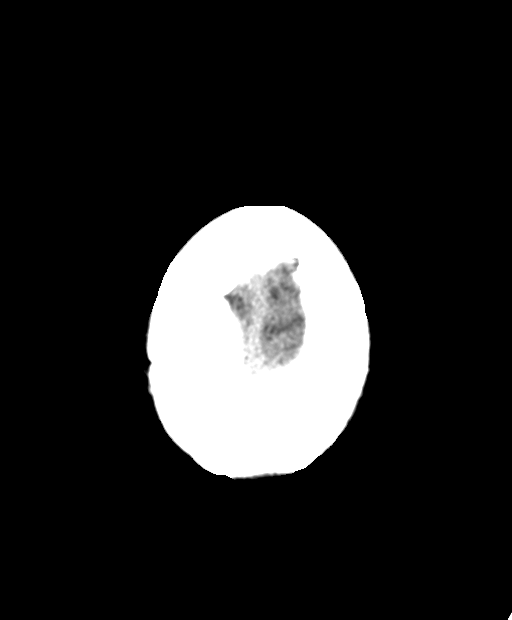

[Series 6: coronal soft tissue · coronal · 0.31mm/px · 3 of 64 slices shown]
[im 22/64  brain]
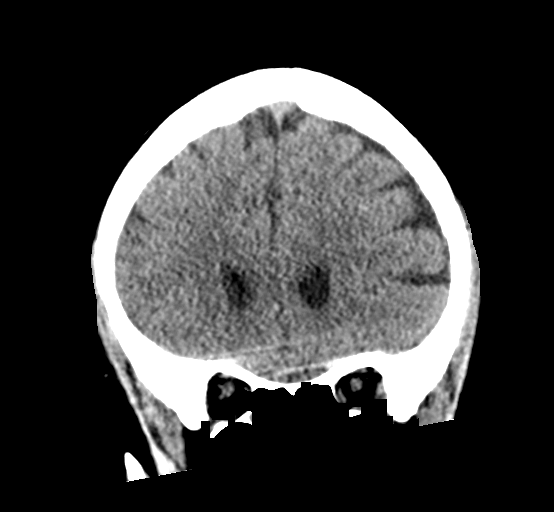
[im 29/64  brain]
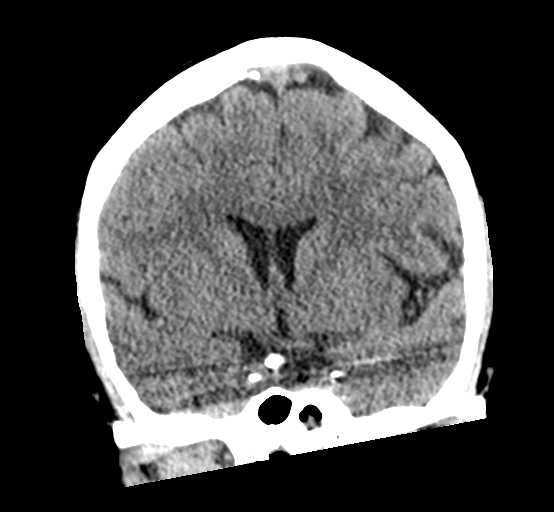
[im 36/64  brain]
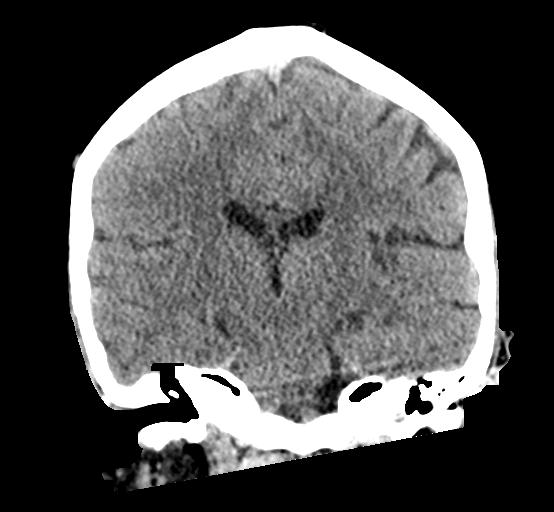

[Series 7: sagittal soft tissue · sagittal · 0.31mm/px · 3 of 49 slices shown]
[im 17/49  brain]
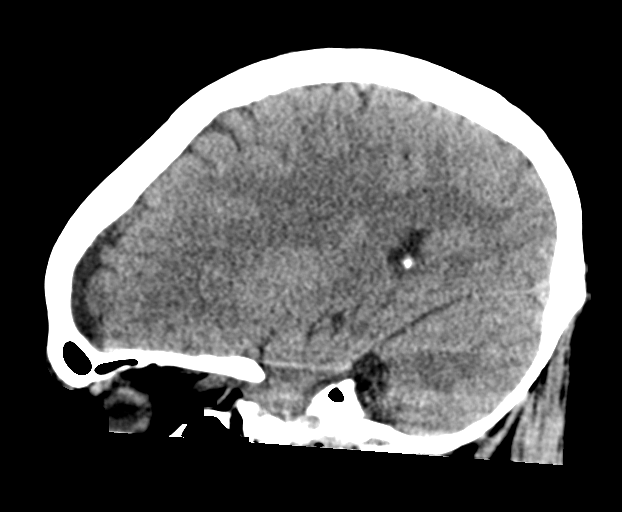
[im 25/49  brain]
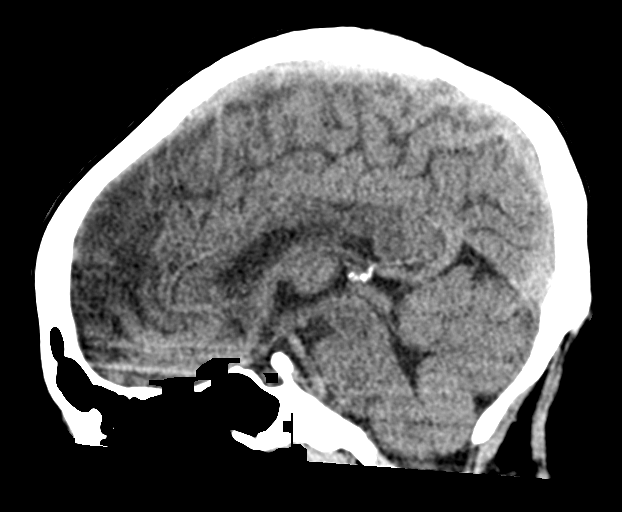
[im 32/49  brain]
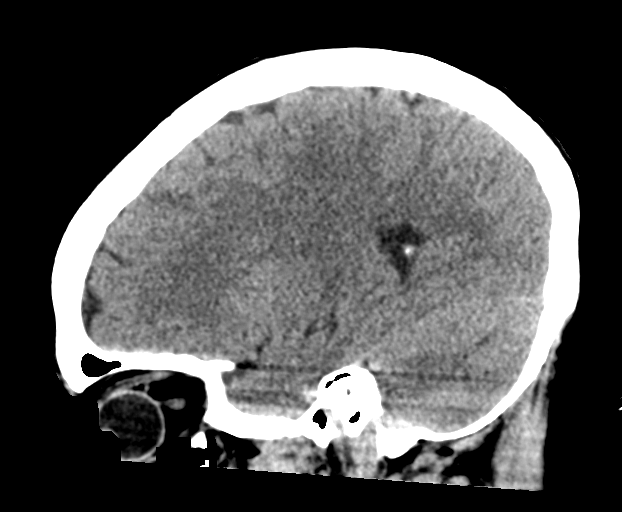

[14 of 47 positions shown; findings below may reference images not displayed]

FINDINGS: Brain: No midline shift, ventriculomegaly, mass effect, evidence of
mass lesion, intracranial hemorrhage or evidence of cortically based
acute infarction. Gray-white matter differentiation is within normal
limits throughout the brain. Cerebral volume is within normal
limits.

Vascular: Mild Calcified atherosclerosis at the skull base. No
suspicious intracranial vascular hyperdensity.

Skull: Negative.

Sinuses/Orbits: Mild bubbly opacity in the left sphenoid sinus and
posterior right ethmoid mucosal thickening. But other Visualized
paranasal sinuses and mastoids are clear.

Other: Leftward gaze deviation. Visualized scalp soft tissues are
within normal limits.
IMPRESSION: 1. Normal noncontrast CT appearance of the brain.
2. Mild paranasal sinus inflammation.

## 2021-09-12 NOTE — Telephone Encounter (Signed)
Requested Prescriptions  Pending Prescriptions Disp Refills   celecoxib (CELEBREX) 200 MG capsule [Pharmacy Med Name: CELECOXIB 200 MG CAPSULE] 60 capsule 1    Sig: TAKE 1 CAPSULE BY MOUTH TWICE A DAY     Analgesics:  COX2 Inhibitors Failed - 09/12/2021 12:49 AM      Failed - HGB in normal range and within 360 days    Hemoglobin  Date Value Ref Range Status  09/09/2021 17.1 (H) 12.0 - 15.0 g/dL Final  10/15/2019 12.8 11.1 - 15.9 g/dL Final         Failed - Cr in normal range and within 360 days    Creatinine  Date Value Ref Range Status  02/19/2014 0.82 0.60 - 1.30 mg/dL Final   Creatinine, Ser  Date Value Ref Range Status  09/09/2021 1.07 (H) 0.44 - 1.00 mg/dL Final         Passed - Patient is not pregnant      Passed - Valid encounter within last 12 months    Recent Outpatient Visits          1 month ago Delusional disorder [facility])   [place], Donald E, MD   5 months ago [place], Donald E, MD   9 months ago Ehlers-Danlos syndrome   [facility] Birdie Sons, MD   1 year ago Sinusitis, unspecified chronicity, unspecified location   [facility] Birdie Sons, MD   1 year ago Rheumatoid arthritis, involving unspecified site, unspecified whether rheumatoid factor present [facility])   [facility] Birdie Sons, MD      Future Appointments            In 2 weeks [place], Kirstie Peri, MD [facility], [place]   In 5 months [place], Nicki Reaper, [facility]

## 2021-09-14 ENCOUNTER — Telehealth: Payer: Self-pay

## 2021-09-14 ENCOUNTER — Ambulatory Visit: Payer: BC Managed Care – PPO | Admitting: *Deleted

## 2021-09-14 DIAGNOSIS — F418 Other specified anxiety disorders: Secondary | ICD-10-CM

## 2021-09-14 DIAGNOSIS — F29 Unspecified psychosis not due to a substance or known physiological condition: Secondary | ICD-10-CM

## 2021-09-14 DIAGNOSIS — G40909 Epilepsy, unspecified, not intractable, without status epilepticus: Secondary | ICD-10-CM

## 2021-09-14 NOTE — Chronic Care Management (AMB) (Signed)
Chronic Care Management    Clinical Social Work Note  09/14/2021 Name: Karen [name] MRN: [id number] DOB: Jan 23, 1976  REMELL GIAIMO is a 46 y.o. year old female who is a primary care patient of Fisher, Kirstie Peri, MD. The CCM team was consulted to assist the patient with chronic disease management and/or care coordination needs related to: [employer]  and [place] and Resources.   Collaboration with patient's mother and [place]   for follow up visit in response to provider referral for social work chronic care management and care coordination services.   Consent to Services:  The patient was given information about Chronic Care Management services, agreed to services, and gave verbal consent prior to initiation of services.  Please see initial visit note for detailed documentation.   Patient agreed to services and consent obtained.   Assessment: Review of patient past medical history, allergies, medications, and health status, including review of relevant consultants reports was performed today as part of a comprehensive evaluation and provision of chronic care management and care coordination services.     SDOH (Social Determinants of Health) assessments and interventions performed:    Advanced Directives Status: Not addressed in this encounter.  CCM Care Plan  Allergies  Allergen Reactions   Augmentin  [Amoxicillin-Pot Clavulanate] Diarrhea   Chocolate Flavor     Other reaction(s): Other (See Comments) Wheezing, acne   Demerol [Meperidine] Other (See Comments)    Slow to wake up when this drug is given.    Keflex [Cephalexin] Nausea And Vomiting   Morphine And Related Nausea And Vomiting   Tape Dermatitis    Must use paper tape only   Toradol [Ketorolac Tromethamine] Nausea And Vomiting   Amoxicillin     Other reaction(s): Unknown   Chocolate     GI distress   Nsaids     patient develops ulcers Other reaction(s): Other (See  Comments) patient develops ulcers   Strawberry Extract     GI distress    Outpatient Encounter Medications as of 09/14/2021  Medication Sig Note   albuterol (VENTOLIN HFA) 108 (90 Base) MCG/ACT inhaler Inhale 2 puffs into the lungs every 6 (six) hours as needed.    azelastine (OPTIVAR) 0.05 % ophthalmic solution Apply to eye as directed. 1 drop in each eye prn (Patient not taking: Reported on 08/04/2021)    busPIRone (BUSPAR) 10 MG tablet Take 1 tablet (10 mg total) by mouth 2 (two) times daily. Take along with 15mg  tablet twice a day 04/22/2021: Take along with one 15 mg tablet for total 25 mg twice daily   busPIRone (BUSPAR) 15 MG tablet TAKE 1 TABLET BY MOUTH 2 TIMES DAILY. 04/22/2021: Take along with one 10 mg tablet for total 25 mg twice daily   celecoxib (CELEBREX) 200 MG capsule TAKE 1 CAPSULE BY MOUTH TWICE A DAY    cephALEXin (KEFLEX) 500 MG capsule Take 1 capsule (500 mg total) by mouth 2 (two) times daily. (Patient not taking: Reported on 06/22/2021)    cyclobenzaprine (FLEXERIL) 5 MG tablet Take 1 tablet (5 mg total) by mouth 3 (three) times daily as needed for muscle spasms. Do not take within 6 hours of taking tizandine    diclofenac Sodium (VOLTAREN) 1 % GEL APPLY AS NEEDED 3 TIMES A DAY    DULoxetine (CYMBALTA) 60 MG capsule TAKE 2 CAPSULES BY MOUTH EVERY DAY    fentaNYL (DURAGESIC) 100 MCG/HR Place 1 patch onto the skin every 3 (three) days.  fexofenadine (ALLEGRA) 180 MG tablet Take 180 mg by mouth daily.    fluticasone (FLONASE) 50 MCG/ACT nasal spray 2 sprays into each nostril Two (2) times a day. 2 sprays each nostril BID. DISP# 2 bottles = 1 month supply (Patient not taking: Reported on 08/04/2021)    fluticasone (FLOVENT HFA) 110 MCG/ACT inhaler Inhale 2 puffs into the lungs 2 (two) times daily.    Fluticasone-Salmeterol (ADVAIR) 250-50 MCG/DOSE AEPB Inhale 1 puff into the lungs every 12 (twelve) hours. Rinse mouth after use (Patient not taking: Reported on 04/27/2021)     gabapentin (NEURONTIN) 600 MG tablet TAKE 2 TABLETS IN THE MORNING AND 3 TABLETS AT BEDTIME    haloperidol (HALDOL) 0.5 MG tablet Take 0.5 tablets (0.25 mg total) by mouth 2 (two) times daily.    haloperidol (HALDOL) 1 MG tablet Take 1 mg by mouth 2 (two) times daily. Patient states she should be and has been on 5 mg twice daily. (Patient not taking: Reported on 06/22/2021)    naloxone [facility]) nasal spray 4 mg/0.1 mL Place 1 spray into the nose as needed. (Patient not taking: Reported on 08/04/2021)    omeprazole (PRILOSEC) 40 MG capsule Take 1 capsule (40 mg total) by mouth 2 (two) times daily.    ondansetron (ZOFRAN-ODT) 4 MG disintegrating tablet DISSOLVE 1 TABLET IN MOUTH EVERY 8 HOURS FOR NAUSEA AND VOMITING    pregabalin (LYRICA) 300 MG capsule Take 1 capsule (300 mg total) by mouth 2 (two) times daily.    QUEtiapine (SEROQUEL) 200 MG tablet TAKE 1 TABLET BY MOUTH AT BEDTIME. (Patient not taking: Reported on 08/02/2021)    Sod Picosulfate-Mag Ox-Cit Acd (CLENPIQ) 10-3.5-12 MG-GM -GM/160ML SOLN Take 320 mLs by mouth as directed. (Patient not taking: Reported on 08/04/2021)    tamsulosin (FLOMAX) 0.4 MG CAPS capsule Take 1 capsule (0.4 mg total) by mouth daily.    tiZANidine (ZANAFLEX) 4 MG tablet TAKE 0.5-1 TABLETS (2-4 MG TOTAL) BY MOUTH EVERY 8 (EIGHT) HOURS AS NEEDED FOR MUSCLE SPASMS. (Patient not taking: Reported on 08/04/2021)    No facility-administered encounter medications on file as of 09/14/2021.    Patient Active Problem List   Diagnosis Date Noted   Polypharmacy 08/12/2021   Major depressive disorder, recurrent episode, moderate ([place]) 99/37/1696   Acute metabolic encephalopathy 78/93/8101   Asthma 04/22/2021   Paranoia (psychosis) ([place]) 04/22/2021   Psychosis ([place]) 04/22/2021   UTI (urinary tract infection) 04/22/2021   Urinary retention 04/22/2021   Chronic tension type headache 06/04/2020   Breast lump on left side at 1 o'clock position 10/09/2019   Fibrocystic breast  changes, bilateral 10/09/2019   Family history of colon cancer    Rectal polyp    Heartburn    Excessive somnolence disorder 06/28/2017   Seizures ([place]) 09/12/2016   Difficulty hearing 08/31/2015   Back pain, chronic 08/31/2015   Rheumatoid arthritis ([place]) 08/31/2015   Alopecia 03/10/2015   Anemia 03/10/2015   Arthritis 03/10/2015   Allergic asthma, mild persistent, uncomplicated 75/05/2584   Chronic nausea 03/10/2015   DDD (degenerative disc disease), lumbar 03/10/2015   Depression with anxiety 03/10/2015   Personal history of MRSA (methicillin resistant Staphylococcus aureus) 03/10/2015   Insomnia 03/10/2015   Mitral valve prolapse 03/10/2015   Paresthesias/numbness 03/10/2015   Allergic rhinitis, seasonal 03/10/2015   Seizure disorder ([place]) 03/10/2015   Mixed sleep apnea, Central predominant 03/10/2015   Migraine without aura 01/27/2014   Fibromyalgia 01/27/2014   Transient alteration of awareness 01/07/2014   Tics of organic  origin 01/07/2014   Backache, unspecified 01/07/2014   Ehlers-Danlos syndrome 01/07/2014    Conditions to be addressed/monitored:  Acute metabolic encephalopathy   - Seizure disorder (HCC)  - Psychosis, unspecified psychosis type ([place])   Limited social support  Care Plan : General Social Work (Adult)  Updates made by [employer], Darla Lesches, LCSW since 09/14/2021 12:00 AM     Problem: CHL AMB "PATIENT-SPECIFIC PROBLEM"   Note:   CARE PLAN ENTRY (see longitudinal plan of care for additional care plan information)  Current Barriers:  Patient with Acute metabolic encephalopathy, Seizure disorder ([place]) and Psychosis, unspecified psychosis type ([place]) in need of assistance with connection to community resources -psychosis continues per patient's mother, patient now not speaking verbally Knowledge deficits and need for support, education and care coordination related to community resource support  Limited social support, Mental Health Concerns , and Lacks  knowledge of community resource: related to patient's supervision needs- Patient's mother is unable to leave patient alone without supervision  Clinical Goal(s)  Over the next 90 days, patient will work with care management team member to address concerns related to patient's supervision needs in the home   Interventions provided by LCSW:  Continued to assessed patient's care coordination needs related to supervision needs in the absence of her parent and discussed ongoing care management follow up  Patient's mother discussed difficulty with getting patient to complete her ADL's, patient refused to attend psychiatrist appointment on 09/09/21 . Confirmed that patient was take to the ED with concerns that she would not eat, drink, or take her medications-patient evaluated and discharged home with [place], OT, RN and SW. PT evaluated patient today Patient's mother concerned about patient and lack of cooperation and was overheard stating that she wanted to go to "heaven". It was confirmed that patient does not have any access to weapons, medications or anything that she could use to harm herself  Continued to discuss contacting the local management entity [place] for any possible assistance-patient's mother agreeable to contacting [facility] for additional support options Per medical record, Adult Protective Services report was made due to self neglect, patient's mother has not been contacted yet Phone call to [facility] to confirm that they would be able to do a curbside service for patient to be seen while at the providers office, this social worker will discuss this options with patient's mother Active listening / Reflection utilized  Emotional Support Provided  Caregiver Stress acknowledged  Patient Self Care Activities & Deficits:  Patient is unable to independently navigate community resource options without care coordination support  Acknowledges deficits and is  motivated to resolve concern  Lacks social connections Performs ADL's independently  Please see past updates related to this goal by clicking on the "Past Updates" button in the selected goal         Follow Up Plan: SW will follow up with patient by phone over the next 14 business days       [employer], [place] Worker  [facility] Care Management [phone]

## 2021-09-14 NOTE — Telephone Encounter (Signed)
This appears to just be an FYI  Copied from [place] [phone]. Topic: General - Other >> Sep 14, 2021 11:26 AM Yvette Rack wrote: Reason for CRM: Dwyane Luo with [facility] reports that patient will be a non-admit for home health. Per Mickel Baas patient mother wanted home health care aid so they were provided an agency that offers that service. Cb# [id number]

## 2021-09-14 NOTE — Telephone Encounter (Signed)
Scheduled virtual palliative consult for 09/16/21 at 1:30 pm

## 2021-09-14 NOTE — Patient Instructions (Signed)
Visit Information  Thank you for taking time to visit with me today. Please don't hesitate to contact me if I can be of assistance to you before our next scheduled telephone appointment.  Following are the goals we discussed today:   - begin a notebook of services in my neighborhood or community - follow-up on any referrals for help I am given - think ahead to make sure my need does not become an emergency - have a back-up plan  -Follow up with initial appointment with Psychiatrist on 09/09/21 -Follow up with the [facility] for additional service options [place]  Our next appointment is by telephone on 09/23/21 at 11am  Please call the care guide team at [phone] if you need to cancel or reschedule your appointment.   If you are experiencing a Mental Health or [place] or need someone to talk to, please call the Suicide and Crisis Lifeline: 988   Patient verbalizes understanding of instructions and care plan provided today and agrees to view in [place]. Active MyChart status confirmed with patient.    Telephone follow up appointment with care management team member scheduled for: 09/23/21   Elliot Gurney, [place] Worker  [facility] Care Management [phone]

## 2021-09-15 ENCOUNTER — Telehealth: Payer: Self-pay | Admitting: Family Medicine

## 2021-09-15 ENCOUNTER — Ambulatory Visit: Payer: Self-pay

## 2021-09-15 ENCOUNTER — Ambulatory Visit (HOSPITAL_COMMUNITY)
Admission: EM | Admit: 2021-09-15 | Discharge: 2021-09-15 | Disposition: A | Discharge status: Home / Self Care | Payer: Medicare Other | Attending: Behavioral Health | Admitting: Behavioral Health

## 2021-09-15 DIAGNOSIS — Z560 Unemployment, unspecified: Secondary | ICD-10-CM | POA: Insufficient documentation

## 2021-09-15 DIAGNOSIS — G8929 Other chronic pain: Secondary | ICD-10-CM | POA: Insufficient documentation

## 2021-09-15 DIAGNOSIS — R450 Nervousness: Secondary | ICD-10-CM | POA: Diagnosis not present

## 2021-09-15 DIAGNOSIS — R634 Abnormal weight loss: Secondary | ICD-10-CM | POA: Diagnosis not present

## 2021-09-15 DIAGNOSIS — Z993 Dependence on wheelchair: Secondary | ICD-10-CM | POA: Diagnosis not present

## 2021-09-15 DIAGNOSIS — M6281 Muscle weakness (generalized): Secondary | ICD-10-CM | POA: Insufficient documentation

## 2021-09-15 DIAGNOSIS — F22 Delusional disorders: Secondary | ICD-10-CM | POA: Insufficient documentation

## 2021-09-15 NOTE — Discharge Instructions (Addendum)
Patient is instructed prior to discharge to:  Take all medications as prescribed by his/her mental healthcare provider. Report any adverse effects and or reactions from the medicines to his/her outpatient provider promptly. Keep all scheduled appointments, to ensure that you are getting refills on time and to avoid any interruption in your medication.  If you are unable to keep an appointment call to reschedule.  Be sure to follow-up with resources and follow-up appointments provided.  Patient has been instructed & cautioned: To not engage in alcohol and or illegal drug use while on prescription medicines. In the event of worsening symptoms, patient is instructed to call the crisis hotline, 911 and or go to the nearest ED for appropriate evaluation and treatment of symptoms. To follow-up with his/her primary care provider for your other medical issues, concerns and or health care needs.   Discussed methods to reduce the risk of self-injury or suicide attempts: Frequent conversations regarding unsafe thoughts. Remove all significant sharps. Remove all firearms. Remove all medications, including over-the-counter meds. Consider lockbox for medications and having a responsible person dispense medications until patient has strengthened coping skills. Room checks for sharps or other harmful objects. Secure all chemical substances that can be ingested or inhaled.

## 2021-09-15 NOTE — Telephone Encounter (Signed)
°  Chief Complaint: pyschosis Symptoms: not eating drinking taking meds, hears voices that tell her what to do Frequency: ongoing for months, was in ED for same thing on 09/09/21 Pertinent Negatives: NA Disposition: [x] ED /[] Urgent Care (no appt availability in office) / [] Appointment(In office/virtual)/ []  [facility]/ [] Home Care/ [] Refused Recommended Disposition /[] [facility]/ []  Follow-up with PCP Additional Notes: Pt's mother asking where to take daughter beside [facility] ED. Gave her number and location for [facility] and advised her to go there to get help for daughter. She wanted to also let Dr. Caryn Section know pt had pysch appt last Thursday but refused to get OOB and go to appt. She states this is an urgent matter and something needs to be done.   Reason for Disposition  Caller requesting urgent psych evaluation or hospitalization  Answer Assessment - Initial Assessment Questions 1. REASON FOR CALL: "What is your main concern?" "What is it that you would like to change?"     Pt not not eating/drinking 2. THERAPY: "Is your child seeing anyone for this problem?" (such as HCP, psychologist, agency)     Had an appt but refused to go, wouldn't get out of bed 3. SAFETY: "Is anyone in danger?" "Do you feel safe?"     No and yes 4. FAMILY FUNCTIONING: "How disruptive is the behavior?" "Is there anything it keeps you from doing?"     Pt is hearing voices that tells her what to do and acts on it at times 6. ONSET: "When did this problem start?"     Been ongoing 7. FREQUENCY: "How often does the problem happen?"     everyday 9. CURRENT BEHAVIOR: "What is your child doing right now?"     Wont eat drink or take medications  Protocols used: Psychosocial Problems-P-AH

## 2021-09-15 NOTE — Telephone Encounter (Signed)
Patients mother called in demanding medication list to be faxed because she is taking the pt to [facility] . Fax [phone]

## 2021-09-15 NOTE — Progress Notes (Signed)
09/15/21 1836  Karen [name] Triage Screening (Walk-ins at [facility] only)  How Did You Hear About [place]? Family/Friend  What Is the Reason for Your Visit/Call Today? Pt is a 46 yo female who presented voluntarily and accompanied by her mother, Karen [name]. Pt gave verbal permission for the NP and this clinician to speak with her mother. Pt did not want her mother to accompany her for her assessment but allowed the team to speak to the mother alone after the pt's assessment. Pt is her own legal guardian. 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 Pt denied HI, NSSH, AVH and paranoia. Pt could not answer the question of SI but stated "I did" and followed with "a while ago." When asked about a plan, pt stated she "wanted to drink the wrong water." Pt verbalized delusions that determine how she acts. Another delusion pt verbalized was that she has "round worms" that have "eaten my brain" and "they rerouted my brain and personality to my back and bottom." Pt stated that she has to be careful about food and water and endorphins because they wake up the worms. "She stated that she believes that all the food and water are contaminated with amphetamines and other harmful substances so she has stopped eating and drinking except for very small amounts each day (per mother).Pt denied AVH but stated that she "hears voices" that tell her what to do and she is afraid not to follow the directions. Per mother, pt sees small figures who are the source of the voices and often looks into a vacant area of the room when asked about something and pauses before answering as if listening to someone or something. Pt was seen in the ED on 09/09/21 for the same symptoms and discharged. Pt has multiple physical conditions that complicate her life. Pt stated and mother confirmed she has Ehler's-Danlos Syndrome which has led to  her requiring a wheelchair to ambulate. She has had 2 separate TBIs 3 years apart when she was in high school which injured the same area of her brain. Per her chart, she has a seizure disorder. Pt has been diagnosed with Acute Metabolic Encephaly. Per mother, until August 2022, pt was functioning normally without delusions, AVH and other psychotic symptoms. Per mother, pt has lost a large amount of weight since August 2022 due to not eating due to her delusional thinking. Pt has been [facility] several times since August 2022 for these same symptoms. Pt's symptoms seem to now be chronic in nature and not progressing.  How Long Has This Been Causing You Problems? > than 6 months  Have You Recently Had Any Thoughts About Hurting Yourself? No  Are You Planning to Commit Suicide/Harm Yourself At This time? No  Have you Recently Had Thoughts About [name]? No  Are You Planning To Harm Someone At This Time? No  Are you currently experiencing any auditory, visual or other hallucinations? Yes  Please explain the hallucinations you are currently experiencing: "hearing voices"  Have You Used Any Alcohol or Drugs in the Past 24 Hours? No  Do you have any current medical co-morbidities that require immediate attention? Yes  Please describe current medical co-morbidities that require immediate attention: (see above)  Clinician description of patient physical appearance/behavior: Pt was seated in a wheelchair. She appeared thin and small  framed and disheveled. Pt's hair appeared unclean and she was wearing paper scrubs with an over-sized winter coat over it. Pt's affect was flat and her mood seemed depressed. Her thought content was fixated on delusions that were affecting her daily actions (eating and drinking, for example.) Pt seemed alert but partially oriented. She seemed to not have any accounting for time or insight into her own delusional thinking. She did seeme to know who she  was and that she was at an urgent care for assessment.  What Do You Feel Would Help You the Most Today?  (Mother voiced that she wanted her admitted to Inpatient psychiatric treatment.)  If access to [facility] was not available, would you have sought care in the Emergency Department? Yes  Determination of Need Urgent (48 hours)   [name], [facility], [facility], [facility] Triage Specialist [facility]

## 2021-09-15 NOTE — ED Provider Notes (Signed)
Behavioral [facility] Ur[facility] Medical Screening Exam  Patient Name: Karen [name] MRN: [id number] Date of Evaluation: 09/15/21 Chief Complaint:   Diagnosis:  Final diagnoses:  Paranoia (psychosis) ([place])    History of Present illness: Karen [name] is a 46 y.o. female. Patient presents voluntarily to [facility] behavioral health for walk-in assessment.  Patient is accompanied by her mother, Karen [name].  Karen [name] endorses paranoid delusion that a roundworm has eaten her brain.  She reports her brain and her "personality are now in other places." Patient reports this is an ongoing issue. Additionally Karen [name] shares that she has concern that her medications are possibly "placebos."  Karen [name] shares that recent stressors include her mother going outside to walk the dog.  Karen [name] shares that her mother does not allow Karen [name] to go outside with her while she walks the dog.  She states "one time I went for a walk in the wheelchair, now she keeps the keys around her neck."  Patient is assessed face-to-face by nurse practitioner.  She is seated in powered wheelchair, in assessment area, no acute distress.  She is alert and oriented, pleasant and cooperative during assessment.   Karen [name] is wearing paper scrubs.  She states "I am wearing hospital scrubs because I do not have a lot of clean clothes."  Patient also shares she has difficulty bathing herself related to muscle weakness.  She presents with anxious mood, congruent affect. She denies suicidal and homicidal ideations.  She denies history of suicide attempts, denies history of non suicidal self-harm behavior.  She contracts verbally for safety with this Probation officer.   Patient exhibits tangential conversation. She reports paranoid ideations surrounding physical conditions including roundworm infection.  [name] resides with her mother, denies access to weapons. She denies alcohol and substance use. She is currently not employed outside  of the home. Patient endorses average sleep and decreased appetite.  Patient offered support and encouragement.  She gives verbal consent to speak with her mother, Karen [name]. Patient's mother shares concern that patient's behavior "came out of nowhere in August 2022.  Patient's mother shares that patient has exhibited "odd behaviors and hallucinations, strange beliefs and delusions, she is afraid of different things."  Spoke with patient's mother who verbalizes concern that patient stopped taking medications several weeks ago.  Patient's mother is now managing medications with direction from primary care provider.  It appears patient is not completely compliant currently.  Patient's mother shares that physical therapy home health care provider presented to the home yesterday to work with patient.  Social worker visited the home today to assist with finding services.  Patient's mother shares that she will meet with outpatient psychiatrist to determine patient's diagnosis so that she can provide this information to social worker who will assist with services.  Patient's mother reports concern that patient has experienced marked weight loss since August 2022.  Patient's mother shares that she believes patient weighed approximately 145 pounds in August 2022.  Per medical record patient weighed 135 pounds 6 days ago.  Patient's mother shares that patient has experienced decreased appetite since August 2022.  Patient denies auditory and visual hallucinations however patient's mother feels that patient is "talking to a little man in the corner."  Safety planning completed with patient's mother who verbalizes understanding.Discussed methods to reduce the risk of self-injury or suicide attempts: Frequent conversations regarding unsafe thoughts. Remove all significant sharps. Remove all firearms. Remove all medications, including over-the-counter meds. Consider lockbox for medications and having a  responsible  person dispense medications until patient has strengthened coping skills. Room checks for sharps or other harmful objects. Secure all chemical substances that can be ingested or inhaled.   Patient's  mother is educated and verbalizes understanding of mental health resources and other crisis services in the community. She is instructed to call 911 and present to the nearest emergency room should patient experience any suicidal/homicidal ideation, auditory/visual/hallucinations, or detrimental worsening of her mental health condition.  Additionally instructed to call 911 and present to nearest emergency department for any worsening of medical condition.   Psychiatric Specialty Exam  Presentation  General Appearance:Disheveled  Eye Contact:Fair  Speech:Normal Rate; Clear and Coherent  Speech Volume:Normal  Handedness:Right   Mood and Affect  Mood:Anxious  Affect:Congruent   Thought Process  Thought Processes:Disorganized  Descriptions of Associations:Tangential  Orientation:Full (Time, Place and Person)  Thought Content:Tangential  Diagnosis of Schizophrenia or Schizoaffective disorder in past: No  Duration of Psychotic Symptoms: Less than six months  Hallucinations:None  Ideas of Reference:Paranoia  Suicidal Thoughts:No  Homicidal Thoughts:No   Sensorium  Memory:Immediate [place]   Executive Functions  Concentration:Fair  Attention Span:Fair  [place] of Knowledge:Good  Language:Good   Psychomotor Activity  Psychomotor Activity:Decreased   Assets  Assets:Communication Skills; Leisure Time; Social Support; Resilience   Sleep  Sleep:Fair  Number of hours: No data recorded  No data recorded  Physical Exam: Physical Exam Vitals and nursing note reviewed.  Constitutional:      Appearance: Normal appearance. She is well-developed.  HENT:     Head: Normocephalic and atraumatic.     Nose:  Nose normal.  Cardiovascular:     Rate and Rhythm: Normal rate.  Pulmonary:     Effort: Pulmonary effort is normal.  Musculoskeletal:        General: Normal range of motion.     Cervical back: Normal range of motion.  Neurological:     Mental Status: She is alert and oriented to person, place, and time.  Psychiatric:        Attention and Perception: Attention normal.        Mood and Affect: Affect normal. Mood is anxious.        Speech: Speech is tangential.        Behavior: Behavior normal. Behavior is cooperative.        Thought Content: Thought content is paranoid and delusional.        Cognition and Memory: Cognition normal.   Review of Systems  Constitutional: Negative.   HENT: Negative.    Eyes: Negative.   Respiratory: Negative.    Cardiovascular: Negative.   Gastrointestinal: Negative.   Genitourinary: Negative.   Musculoskeletal:        Chronic pain, uses powered wheelchair  Skin: Negative.   Neurological: Negative.   Endo/Heme/Allergies: Negative.   Psychiatric/Behavioral:  The patient is nervous/anxious.   Blood pressure (!) 127/96, pulse 77, temperature 97.6 F (36.4 C), resp. rate 18, SpO2 99 %. There is no height or weight on file to calculate BMI.  Musculoskeletal: Strength & Muscle Tone: decreased Gait & Station: powered wheelchair in use upon my assessment Patient leans: N/A   [place] [facility] Discharge Disposition for Follow up and Recommendations: Based on my evaluation the patient does not appear to have an emergency medical condition and can be discharged with resources and follow up care in outpatient services for Medication Management, Partial Hospitalization Program, and Individual Therapy Patient reviewed with Dr. Serafina Mitchell. Follow-up with outpatient psychiatry, resources  provided. Continue current medications.   Lucky Rathke, FNP 09/15/2021, 7:02 PM
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
## 2021-09-15 NOTE — Progress Notes (Signed)
CSW added community resources for outpatient therapy in pt's AVS per request from provider Zenia Resides, NP.  Benjaman Kindler, MSW, [facility] 09/15/2021 6:47 PM

## 2021-09-16 ENCOUNTER — Telehealth: Payer: Self-pay | Admitting: Nurse Practitioner

## 2021-09-16 ENCOUNTER — Other Ambulatory Visit: Payer: Self-pay

## 2021-09-16 ENCOUNTER — Encounter: Payer: Self-pay | Admitting: Nurse Practitioner

## 2021-09-16 ENCOUNTER — Other Ambulatory Visit: Payer: Medicare Other | Admitting: Nurse Practitioner

## 2021-09-16 DIAGNOSIS — R5381 Other malaise: Secondary | ICD-10-CM | POA: Diagnosis not present

## 2021-09-16 DIAGNOSIS — R443 Hallucinations, unspecified: Secondary | ICD-10-CM

## 2021-09-16 DIAGNOSIS — F99 Mental disorder, not otherwise specified: Secondary | ICD-10-CM

## 2021-09-16 NOTE — Telephone Encounter (Signed)
Looks like where she was taken has access to [employer].

## 2021-09-16 NOTE — Telephone Encounter (Signed)
I attempted to call Karen [name] mother as telemedicine zoom link opened for initial pc visit, no answer; message left; Karen [name] did return call but hung up when attempted to explain about zoom scheduled initial pc visit. I attempted to recontact, no answer. Will send to reschedule.

## 2021-09-16 NOTE — Progress Notes (Signed)
COMMUNITY PALLIATIVE CARE SW NOTE  PATIENT NAME: Karen [name] DOB: March 05, 1976 MRN: [id number]  PRIMARY CARE PROVIDER: Birdie Sons, MD  RESPONSIBLE PARTY: There is no guarantor information entered for this encounter.  PC SW outreached patients mother, Karen [name], per [facility] NP - C. Gusler, request to discuss patient needs and mental health status.   Mother share that she is concerned about patients mental health state as she is paranoid of food and drinks and seems to be hallucinating today, by stating to mom "I don't know who you are" and calling her by another name. Mom expressed extreme fatigue in obtaining adequate assistance with clients MH. She states she has had taken client [name] and [name] for assistance and evaluation and both discharged patient without psych assistance or community follow ups. Patient has a psychiatrist, Karen [name], but has not been able to see her as pt refused to go to previous scheduled appointment.   Patient has not taken medications in the past 2 weeks or used her CPAP/BiPAP machine as she is convinced tey are placebos and trying to poison her. Patient has also been refusing food and water, which has led to an extreme weight loss per mom. Curently patient is a harm to herself due to being noncompliant with medications for the past 2 weeks and refusing to eat/drink.    SW and mother discussed IVC option and the process and what to expect. Mother shared that she will proceed with this option tomorrow in hopes that it will help client and stabilize her [place]. Mother shared that she will also outreach clients PCP.      SOCIAL HX:  Social History   Tobacco Use   Smoking status: Never   Smokeless tobacco: Never  Substance Use Topics   Alcohol use: No    Alcohol/week: 0.0 standard drinks         [place], [employer]

## 2021-09-16 NOTE — Progress Notes (Signed)
[facility] Consult Note Telephone: [phone]  Fax: [phone]   Date of encounter: 09/16/21 3:45 PM PATIENT NAME: Karen Karen [name] [address] [place] [address]   [id number] (home)  DOB: February 20, 1976 MRN: [id number] PRIMARY CARE PROVIDER:    Birdie Sons, MD,  [address] [place] [address] [phone]  REFERRING PROVIDER:   Birdie [name], [place],  [place] [address] [phone]  RESPONSIBLE PARTY:    Contact Information     Name Relation Home Work Mobile   Karen Karen [name], Karen Karen [name] Karen [name] [phone]  [phone]   Karen Karen [name], Karen Karen [name]   [phone]      Due to the COVID-19 crisis, this visit was done via telemedicine from my office and it was initiated and consent by this patient and or family.  I connected with Karen Karen [name]  with  Karen Karen [name] OR PROXY on 09/16/21 by a telephone as video not available enabled telemedicine application and verified that I am speaking with the correct person using two identifiers.   I discussed the limitations of evaluation and management by telemedicine. The patient expressed understanding and agreed to proceed. Palliative Care was asked to follow this patient by consultation request of  Karen Karen [name], Karen Peri, MD to address advance care planning and complex medical decision making. This is the initial visit.                                   ASSESSMENT AND PLAN / RECOMMENDATIONS:  Symptom Management/Plan: 1. Advance Care Planning; Will need further discussions with Karen [name]  2. Hallucinations with paranoid delusions discussed at length with Karen Karen [name] concerns of severe debility, symptoms, advised to take Karen Karen [name] to ED for re-evaluation; also discussed at length about process of involuntary commitment through magistrate office. We talked about safety, currently Karen Karen [name] is sleeping per Karen Karen [name]. Karen Karen [name] in  agreement. Discussed will have PC SW consult for further discussion. Karen Karen [name] endorses Karen Karen [name] does not have out patient psychiatry appointment, also Karen Karen [name] is so debilitated that she is not able to get her to appointments.   3. Goals of Care: Goals include to maximize quality of life and symptom management. Our advance care planning conversation included a discussion about:    The value and importance of advance care planning  Exploration of personal, cultural or spiritual beliefs that might influence medical decisions  Exploration of goals of care in the event of a sudden injury or illness  Identification and preparation of a healthcare agent  Review and updating or creation of an advance directive document.  4. Palliative care encounter; Palliative care encounter; Palliative medicine team will continue to support patient, patient's family, and medical team. Visit consisted of counseling and education dealing with the complex and emotionally intense issues of symptom management and palliative care in the setting of serious and potentially life-threatening illness 4. f/u 1 month for ongoing monitoring chronic disease progression, ongoing discussions complex medical decision making  Follow up Palliative Care Visit: Palliative care will continue to follow for complex medical decision making, advance care planning, and clarification of goals. Return 1 weeks or prn.  I spent 64 minutes providing this consultation. More than 50% of the time in this consultation was spent in counseling and care coordination.  PPS: 30%  HOSPICE ELIGIBILITY/DIAGNOSIS: TBD  Chief Complaint: Initial palliative consult for complex medical decision making  HISTORY OF PRESENT ILLNESS:  Karen Karen [name] is a 46 y.o. year old female  with multiple medical problems including Ehlers-Danlos syndrome with hypermobile joints, TBI, seizure disorder, fibromyalgia, tension headaches, obstructive with central apnea on bipap,  RA, chronic back pain, DDD, anemia, arthritis, alopecia, depression with paranoid psychosis. I called Karen Level, Ms Karen Karen [name] for telemedicine telephonic visit as video are not available. Karen Karen [name] was with Karen Karen [name]. Karen Karen [name] talked about the last time Karen Karen [name] was independent, events that transpired to the point where she is today. Karen Karen [name] endorses Karen Karen [name] is totally debilitated, lift to w/c, requires to be bathed, dressed, fed with very poor appetite, significant weight loss, hallucinations paranoid with delusions. Karen Karen [name] endorses Karen Karen [name] has had an inpatient admission on psychiatry floor at [facility] but was disappointed as she returned in what appeared worse than when she was admitted to f/u with out patient psychiatry. Karen Karen [name] endorses she recently took Karen Karen [name] to behavioral health but they did not keep her. Karen Karen [name] endorses she is elderly herself, it is very difficult to care for Karen [name] with increase in physical and mental health needs. Karen Karen [name] endorses Karen Karen [name] has been hoarding her medications, not taking, not bathing, eating with significant weight loss. She is sleeping all the time. Karen Karen [name] endorses she has not been established with out patient psychiatry. Karen Karen [name] endorses she appears dehydrated, possible having a fever. We talked about calling ems to transport back to ED for re-evaluation, discussed voluntary vs involuntary commitment. Discussed PC SW role and will do a consult. Discussed medical goals, scheduled f/u in person PC visit. Therapeutic listening, emotional support provided. Questions answered.   She takes a variety of narcotic and nonnarcotic medications in order to control her pain.    History obtained from review of EMR, discussion with Karen Karen [name] with Karen Karen [name].  I reviewed available labs, medications, imaging, studies and related documents from the EMR.  Records reviewed and summarized above.   ROS Reviewed 10 point system with Karen Novel  Karen [name]  negative except HPI  Physical Exam: deferred CURRENT PROBLEM LIST:  Patient Active Problem List   Diagnosis Date Noted   Polypharmacy 08/12/2021   Major depressive disorder, recurrent episode, moderate ([place]) 32/99/2426   Acute metabolic encephalopathy 83/41/9622   Asthma 04/22/2021   Paranoia (psychosis) ([place]) 04/22/2021   Psychosis ([place]) 04/22/2021   UTI (urinary tract infection) 04/22/2021   Urinary retention 04/22/2021   Chronic tension type headache 06/04/2020   Breast lump on left side at 1 o'clock position 10/09/2019   Fibrocystic breast changes, bilateral 10/09/2019   Family history of colon cancer    Rectal polyp    Heartburn    Excessive somnolence disorder 06/28/2017   Seizures ([place]) 09/12/2016   Difficulty hearing 08/31/2015   Back pain, chronic 08/31/2015   Rheumatoid arthritis ([place]) 08/31/2015   Alopecia 03/10/2015   Anemia 03/10/2015   Arthritis 03/10/2015   Allergic asthma, mild persistent, uncomplicated 29/79/8921   Chronic nausea 03/10/2015   DDD (degenerative disc disease), lumbar 03/10/2015   Depression with anxiety 03/10/2015   Personal history of MRSA (methicillin resistant Staphylococcus aureus) 03/10/2015   Insomnia 03/10/2015   Mitral valve prolapse 03/10/2015   Paresthesias/numbness 03/10/2015   Allergic rhinitis, seasonal 03/10/2015   Seizure disorder ([place]) 03/10/2015   Mixed sleep apnea, Central predominant 03/10/2015   Migraine without aura 01/27/2014   Fibromyalgia 01/27/2014   Transient alteration of awareness 01/07/2014   Tics of organic origin 01/07/2014   Backache, unspecified 01/07/2014   Ehlers-Danlos syndrome  01/07/2014   PAST MEDICAL HISTORY:  Active Ambulatory Problems    Diagnosis Date Noted   Transient alteration of awareness 01/07/2014   Tics of organic origin 01/07/2014   Backache, unspecified 01/07/2014   Ehlers-Danlos syndrome 01/07/2014   Migraine without aura 01/27/2014   Fibromyalgia 01/27/2014   Alopecia  03/10/2015   Anemia 03/10/2015   Arthritis 03/10/2015   Allergic asthma, mild persistent, uncomplicated 19/50/9326   Chronic nausea 03/10/2015   DDD (degenerative disc disease), lumbar 03/10/2015   Depression with anxiety 03/10/2015   Personal history of MRSA (methicillin resistant Staphylococcus aureus) 03/10/2015   Insomnia 03/10/2015   Mitral valve prolapse 03/10/2015   Paresthesias/numbness 03/10/2015   Allergic rhinitis, seasonal 03/10/2015   Seizure disorder ([place]) 03/10/2015   Mixed sleep apnea, Central predominant 03/10/2015   Difficulty hearing 08/31/2015   Back pain, chronic 08/31/2015   Rheumatoid arthritis ([place]) 08/31/2015   Seizures ([place]) 09/12/2016   Excessive somnolence disorder 06/28/2017   Family history of colon cancer    Rectal polyp    Heartburn    Breast lump on left side at 1 o'clock position 10/09/2019   Fibrocystic breast changes, bilateral 10/09/2019   Chronic tension type headache 71/24/5809   Acute metabolic encephalopathy 98/33/8250   Asthma 04/22/2021   Paranoia (psychosis) ([place]) 04/22/2021   Psychosis ([place]) 04/22/2021   UTI (urinary tract infection) 04/22/2021   Urinary retention 04/22/2021   Major depressive disorder, recurrent episode, moderate ([place]) 05/01/2021   Polypharmacy 08/12/2021   Resolved Ambulatory Problems    Diagnosis Date Noted   Abdominal pain 03/10/2015   Sinusitis 09/12/2016   Benign neoplasm of transverse colon    Polyp of sigmoid colon    Benign neoplasm of ascending colon    Abdominal pain, epigastric    Chronic respiratory failure with hypoxia and hypercapnia ([place]) 05/08/2019   Frequent urination 10/09/2019   Sepsis ([place]) 04/22/2021   Past Medical History:  Diagnosis Date   Headache    Sleep apnea    SOCIAL HX:  Social History   Tobacco Use   Smoking status: Never   Smokeless tobacco: Never  Substance Use Topics   Alcohol use: No    Alcohol/week: 0.0 standard drinks   FAMILY HX:  Family History  Problem  Relation Age of Onset   Depression Karen [name]    Stroke Karen [name]    Fibromyalgia Karen [name]    Osteoarthritis Karen [name]    Asthma Brother    Depression Brother    Migraines Brother    Asperger's syndrome Brother    Other Brother        Ehlers-Danlos Syndrome   Cancer Maternal Aunt    Asperger's syndrome Maternal Aunt    Breast cancer Maternal Aunt    Heart disease Paternal Aunt    Heart disease Paternal Uncle    Cervical cancer Maternal Grandmother    Osteoporosis Maternal Grandmother        Died at 30   Osteoarthritis Maternal Grandmother    Colon cancer Maternal Grandfather    Asthma Maternal Grandfather    Asperger's syndrome Maternal Grandfather    Emphysema Maternal Grandfather        Died at 13   Prostate cancer Maternal Grandfather    Heart attack Paternal Grandfather        Died at 26   Asthma Paternal Grandfather    Tuberculosis Paternal Grandfather    Cancer Cousin    Asperger's syndrome Cousin    Asperger's syndrome Other    Heart Problems Paternal Grandmother  Died at 65   Ehlers-Danlos syndrome Father    Ehlers-Danlos syndrome Brother      ALLERGIES:  Allergies  Allergen Reactions   Augmentin  [Amoxicillin-Pot Clavulanate] Diarrhea   Chocolate Flavor     Other reaction(s): Other (See Comments) Wheezing, acne   Demerol [Meperidine] Other (See Comments)    Slow to wake up when this drug is given.    Keflex [Cephalexin] Nausea And Vomiting   Morphine And Related Nausea And Vomiting   Tape Dermatitis    Must use paper tape only   Toradol [Ketorolac Tromethamine] Nausea And Vomiting   Amoxicillin     Other reaction(s): Unknown   Chocolate     GI distress   Nsaids     patient develops ulcers Other reaction(s): Other (See Comments) patient develops ulcers   Strawberry Extract     GI distress     PERTINENT MEDICATIONS:  Outpatient Encounter Medications as of 09/16/2021  Medication Sig   albuterol (VENTOLIN HFA) 108 (90 Base) MCG/ACT inhaler Inhale 2 puffs  into the lungs every 6 (six) hours as needed.   azelastine (OPTIVAR) 0.05 % ophthalmic solution Apply to eye as directed. 1 drop in each eye prn (Patient not taking: Reported on 08/04/2021)   busPIRone (BUSPAR) 10 MG tablet Take 1 tablet (10 mg total) by mouth 2 (two) times daily. Take along with 15mg  tablet twice a day   busPIRone (BUSPAR) 15 MG tablet TAKE 1 TABLET BY MOUTH 2 TIMES DAILY.   celecoxib (CELEBREX) 200 MG capsule TAKE 1 CAPSULE BY MOUTH TWICE A DAY   cephALEXin (KEFLEX) 500 MG capsule Take 1 capsule (500 mg total) by mouth 2 (two) times daily. (Patient not taking: Reported on 06/22/2021)   cyclobenzaprine (FLEXERIL) 5 MG tablet Take 1 tablet (5 mg total) by mouth 3 (three) times daily as needed for muscle spasms. Do not take within 6 hours of taking tizandine   diclofenac Sodium (VOLTAREN) 1 % GEL APPLY AS NEEDED 3 TIMES A DAY   DULoxetine (CYMBALTA) 60 MG capsule TAKE 2 CAPSULES BY MOUTH EVERY DAY   fentaNYL (DURAGESIC) 100 MCG/HR Place 1 patch onto the skin every 3 (three) days.   fexofenadine (ALLEGRA) 180 MG tablet Take 180 mg by mouth daily.   fluticasone (FLONASE) 50 MCG/ACT nasal spray 2 sprays into each nostril Two (2) times a day. 2 sprays each nostril BID. DISP# 2 bottles = 1 month supply (Patient not taking: Reported on 08/04/2021)   fluticasone (FLOVENT HFA) 110 MCG/ACT inhaler Inhale 2 puffs into the lungs 2 (two) times daily.   Fluticasone-Salmeterol (ADVAIR) 250-50 MCG/DOSE AEPB Inhale 1 puff into the lungs every 12 (twelve) hours. Rinse mouth after use (Patient not taking: Reported on 04/27/2021)   gabapentin (NEURONTIN) 600 MG tablet TAKE 2 TABLETS IN THE MORNING AND 3 TABLETS AT BEDTIME   haloperidol (HALDOL) 0.5 MG tablet Take 0.5 tablets (0.25 mg total) by mouth 2 (two) times daily.   haloperidol (HALDOL) 1 MG tablet Take 1 mg by mouth 2 (two) times daily. Patient states she should be and has been on 5 mg twice daily. (Patient not taking: Reported on 06/22/2021)    naloxone [facility]) nasal spray 4 mg/0.1 mL Place 1 spray into the nose as needed. (Patient not taking: Reported on 08/04/2021)   omeprazole (PRILOSEC) 40 MG capsule Take 1 capsule (40 mg total) by mouth 2 (two) times daily.   ondansetron (ZOFRAN-ODT) 4 MG disintegrating tablet DISSOLVE 1 TABLET IN MOUTH EVERY 8 HOURS FOR NAUSEA  AND VOMITING   pregabalin (LYRICA) 300 MG capsule Take 1 capsule (300 mg total) by mouth 2 (two) times daily.   QUEtiapine (SEROQUEL) 200 MG tablet TAKE 1 TABLET BY MOUTH AT BEDTIME. (Patient not taking: Reported on 08/02/2021)   Sod Picosulfate-Mag Ox-Cit Acd (CLENPIQ) 10-3.5-12 MG-GM -GM/160ML SOLN Take 320 mLs by mouth as directed. (Patient not taking: Reported on 08/04/2021)   tamsulosin (FLOMAX) 0.4 MG CAPS capsule Take 1 capsule (0.4 mg total) by mouth daily.   tiZANidine (ZANAFLEX) 4 MG tablet TAKE 0.5-1 TABLETS (2-4 MG TOTAL) BY MOUTH EVERY 8 (EIGHT) HOURS AS NEEDED FOR MUSCLE SPASMS. (Patient not taking: Reported on 08/04/2021)   No facility-administered encounter medications on file as of 09/16/2021.   Thank you for the opportunity to participate in the care of Karen Karen [name].  The palliative care team will continue to follow. Please call our office at [phone] if we can be of additional assistance.   Questions and concerns were addressed. The patient/family was encouraged to call with questions and/or concerns. My contact information was provided. Provided general support and encouragement, no other unmet needs identified   Othar Curto Ihor Gully, NP ,

## 2021-09-17 IMAGING — DX DG CHEST 1V PORT
1 series · 1 of 1 positions shown · non-contrast
Comparison: April 22, 2021.

CLINICAL DATA: Altered mental status.

EXAM:
PORTABLE CHEST 1 VIEW

[chest ap]
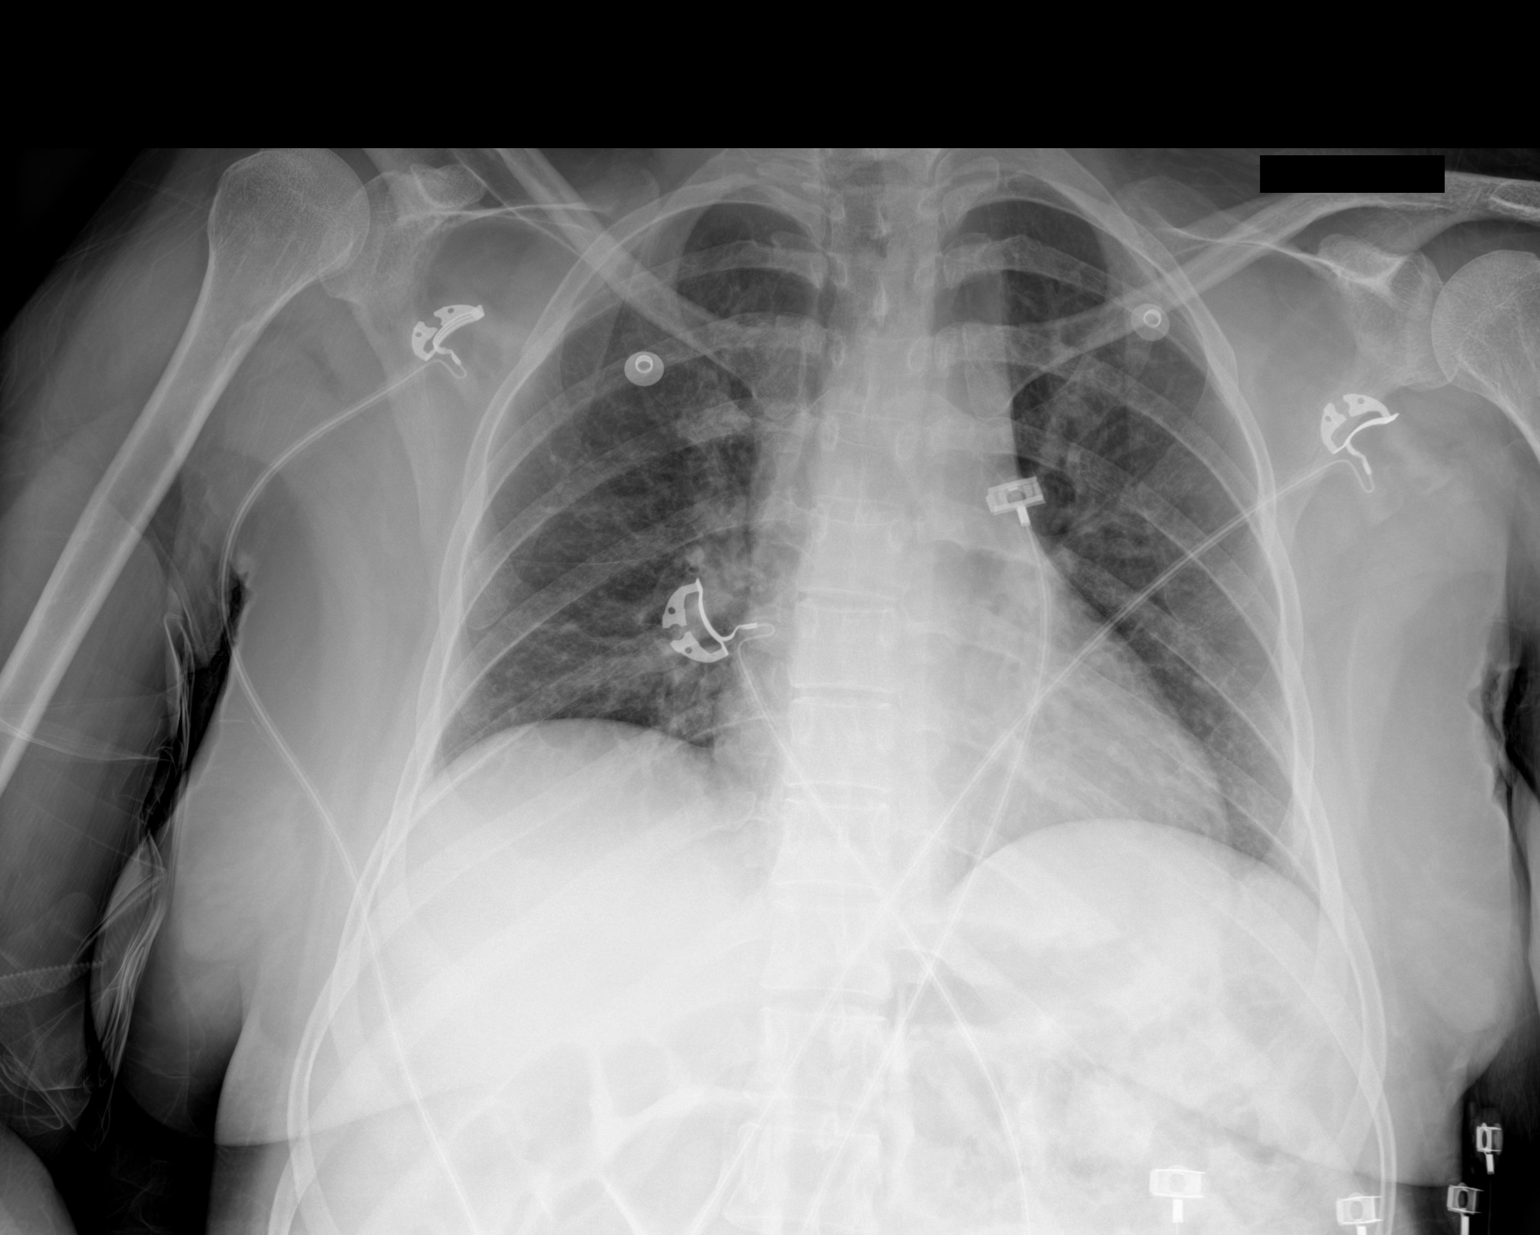

[1 of 1 positions shown; findings below may reference images not displayed]

FINDINGS: The heart size and mediastinal contours are within normal limits.
Both lungs are clear. The visualized skeletal structures are
unremarkable.
IMPRESSION: No active disease.

## 2021-09-21 ENCOUNTER — Other Ambulatory Visit: Payer: Self-pay | Admitting: Family Medicine

## 2021-09-21 DIAGNOSIS — F32A Depression, unspecified: Secondary | ICD-10-CM

## 2021-09-21 NOTE — Telephone Encounter (Signed)
Requested Prescriptions  Pending Prescriptions Disp Refills   busPIRone (BUSPAR) 15 MG tablet [Pharmacy Med Name: BUSPIRONE HCL 15 MG TABLET] 180 tablet 4    Sig: TAKE 1 TABLET BY MOUTH TWICE A DAY     Psychiatry: Anxiolytics/Hypnotics - Non-controlled Passed - 09/21/2021  1:30 AM      Passed - Valid encounter within last 12 months    Recent Outpatient Visits          1 month ago Delusional disorder [facility])   [facility], Kirstie Peri, MD   5 months ago [place], Donald E, MD   9 months ago Ehlers-Danlos syndrome   [facility] Birdie Sons, MD   1 year ago Sinusitis, unspecified chronicity, unspecified location   [facility] Birdie Sons, MD   1 year ago Rheumatoid arthritis, involving unspecified site, unspecified whether rheumatoid factor present [facility])   [facility] Birdie Sons, MD      Future Appointments            In 1 week [place], Kirstie Peri, MD [facility] [place]   In 5 months [facility], [place]

## 2021-09-22 ENCOUNTER — Ambulatory Visit (INDEPENDENT_AMBULATORY_CARE_PROVIDER_SITE_OTHER): Payer: Medicare Other | Admitting: *Deleted

## 2021-09-22 ENCOUNTER — Telehealth: Payer: Self-pay | Admitting: Nurse Practitioner

## 2021-09-22 ENCOUNTER — Other Ambulatory Visit: Payer: Self-pay

## 2021-09-22 ENCOUNTER — Other Ambulatory Visit: Payer: Medicare Other | Admitting: Nurse Practitioner

## 2021-09-22 DIAGNOSIS — F29 Unspecified psychosis not due to a substance or known physiological condition: Secondary | ICD-10-CM

## 2021-09-22 DIAGNOSIS — G40909 Epilepsy, unspecified, not intractable, without status epilepticus: Secondary | ICD-10-CM

## 2021-09-22 DIAGNOSIS — F418 Other specified anxiety disorders: Secondary | ICD-10-CM

## 2021-09-22 NOTE — Chronic Care Management (AMB) (Signed)
Chronic Care Management    Clinical Social Work Note  09/22/2021 Name: Karen [name] MRN: [id number] DOB: 02-Apr-1976  Karen [name] is a 46 y.o. year old female who is a primary care patient of Fisher, Kirstie Peri, MD. The CCM team was consulted to assist the patient with chronic disease management and/or care coordination needs related to: Mental Health Counseling and Resources.   Collaboration with patient's mother  for follow up visit in response to provider referral for social work chronic care management and care coordination services.   Consent to Services:  The patient was given information about Chronic Care Management services, agreed to services, and gave verbal consent prior to initiation of services.  Please see initial visit note for detailed documentation.   Patient agreed to services and consent obtained.   Assessment: Review of patient past medical history, allergies, medications, and health status, including review of relevant consultants reports was performed today as part of a comprehensive evaluation and provision of chronic care management and care coordination services.     SDOH (Social Determinants of Health) assessments and interventions performed:    Advanced Directives Status: Not addressed in this encounter.  CCM Care Plan  Allergies  Allergen Reactions   Augmentin  [Amoxicillin-Pot Clavulanate] Diarrhea   Chocolate Flavor     Other reaction(s): Other (See Comments) Wheezing, acne   Demerol [Meperidine] Other (See Comments)    Slow to wake up when this drug is given.    Keflex [Cephalexin] Nausea And Vomiting   Morphine And Related Nausea And Vomiting   Tape Dermatitis    Must use paper tape only   Toradol [Ketorolac Tromethamine] Nausea And Vomiting   Amoxicillin     Other reaction(s): Unknown   Chocolate     GI distress   Nsaids     patient develops ulcers Other reaction(s): Other (See Comments) patient develops ulcers   Strawberry Extract      GI distress    Outpatient Encounter Medications as of 09/22/2021  Medication Sig Note   albuterol (VENTOLIN HFA) 108 (90 Base) MCG/ACT inhaler Inhale 2 puffs into the lungs every 6 (six) hours as needed.    azelastine (OPTIVAR) 0.05 % ophthalmic solution Apply to eye as directed. 1 drop in each eye prn (Patient not taking: Reported on 08/04/2021)    busPIRone (BUSPAR) 10 MG tablet Take 1 tablet (10 mg total) by mouth 2 (two) times daily. Take along with 15mg  tablet twice a day 04/22/2021: Take along with one 15 mg tablet for total 25 mg twice daily   busPIRone (BUSPAR) 15 MG tablet TAKE 1 TABLET BY MOUTH TWICE A DAY    celecoxib (CELEBREX) 200 MG capsule TAKE 1 CAPSULE BY MOUTH TWICE A DAY    cephALEXin (KEFLEX) 500 MG capsule Take 1 capsule (500 mg total) by mouth 2 (two) times daily. (Patient not taking: Reported on 06/22/2021)    cyclobenzaprine (FLEXERIL) 5 MG tablet Take 1 tablet (5 mg total) by mouth 3 (three) times daily as needed for muscle spasms. Do not take within 6 hours of taking tizandine    diclofenac Sodium (VOLTAREN) 1 % GEL APPLY AS NEEDED 3 TIMES A DAY    DULoxetine (CYMBALTA) 60 MG capsule TAKE 2 CAPSULES BY MOUTH EVERY DAY    fentaNYL (DURAGESIC) 100 MCG/HR Place 1 patch onto the skin every 3 (three) days.    fexofenadine (ALLEGRA) 180 MG tablet Take 180 mg by mouth daily.    fluticasone (FLONASE) 50 MCG/ACT nasal spray  2 sprays into each nostril Two (2) times a day. 2 sprays each nostril BID. DISP# 2 bottles = 1 month supply (Patient not taking: Reported on 08/04/2021)    fluticasone (FLOVENT HFA) 110 MCG/ACT inhaler Inhale 2 puffs into the lungs 2 (two) times daily.    Fluticasone-Salmeterol (ADVAIR) 250-50 MCG/DOSE AEPB Inhale 1 puff into the lungs every 12 (twelve) hours. Rinse mouth after use (Patient not taking: Reported on 04/27/2021)    gabapentin (NEURONTIN) 600 MG tablet TAKE 2 TABLETS IN THE MORNING AND 3 TABLETS AT BEDTIME    haloperidol (HALDOL) 0.5 MG tablet Take  0.5 tablets (0.25 mg total) by mouth 2 (two) times daily.    haloperidol (HALDOL) 1 MG tablet Take 1 mg by mouth 2 (two) times daily. Patient states she should be and has been on 5 mg twice daily. (Patient not taking: Reported on 06/22/2021)    naloxone [facility]) nasal spray 4 mg/0.1 mL Place 1 spray into the nose as needed. (Patient not taking: Reported on 08/04/2021)    omeprazole (PRILOSEC) 40 MG capsule Take 1 capsule (40 mg total) by mouth 2 (two) times daily.    ondansetron (ZOFRAN-ODT) 4 MG disintegrating tablet DISSOLVE 1 TABLET IN MOUTH EVERY 8 HOURS FOR NAUSEA AND VOMITING    pregabalin (LYRICA) 300 MG capsule Take 1 capsule (300 mg total) by mouth 2 (two) times daily.    QUEtiapine (SEROQUEL) 200 MG tablet TAKE 1 TABLET BY MOUTH AT BEDTIME. (Patient not taking: Reported on 08/02/2021)    Sod Picosulfate-Mag Ox-Cit Acd (CLENPIQ) 10-3.5-12 MG-GM -GM/160ML SOLN Take 320 mLs by mouth as directed. (Patient not taking: Reported on 08/04/2021)    tamsulosin (FLOMAX) 0.4 MG CAPS capsule Take 1 capsule (0.4 mg total) by mouth daily.    tiZANidine (ZANAFLEX) 4 MG tablet TAKE 0.5-1 TABLETS (2-4 MG TOTAL) BY MOUTH EVERY 8 (EIGHT) HOURS AS NEEDED FOR MUSCLE SPASMS. (Patient not taking: Reported on 08/04/2021)    No facility-administered encounter medications on file as of 09/22/2021.    Patient Active Problem List   Diagnosis Date Noted   Polypharmacy 08/12/2021   Major depressive disorder, recurrent episode, moderate ([place]) 44/81/8563   Acute metabolic encephalopathy 14/97/0263   Asthma 04/22/2021   Paranoia (psychosis) ([place]) 04/22/2021   Psychosis ([place]) 04/22/2021   UTI (urinary tract infection) 04/22/2021   Urinary retention 04/22/2021   Chronic tension type headache 06/04/2020   Breast lump on left side at 1 o'clock position 10/09/2019   Fibrocystic breast changes, bilateral 10/09/2019   Family history of colon cancer    Rectal polyp    Heartburn    Excessive somnolence disorder 06/28/2017    Seizures ([place]) 09/12/2016   Difficulty hearing 08/31/2015   Back pain, chronic 08/31/2015   Rheumatoid arthritis ([place]) 08/31/2015   Alopecia 03/10/2015   Anemia 03/10/2015   Arthritis 03/10/2015   Allergic asthma, mild persistent, uncomplicated 78/58/8502   Chronic nausea 03/10/2015   DDD (degenerative disc disease), lumbar 03/10/2015   Depression with anxiety 03/10/2015   Personal history of MRSA (methicillin resistant Staphylococcus aureus) 03/10/2015   Insomnia 03/10/2015   Mitral valve prolapse 03/10/2015   Paresthesias/numbness 03/10/2015   Allergic rhinitis, seasonal 03/10/2015   Seizure disorder ([place]) 03/10/2015   Mixed sleep apnea, Central predominant 03/10/2015   Migraine without aura 01/27/2014   Fibromyalgia 01/27/2014   Transient alteration of awareness 01/07/2014   Tics of organic origin 01/07/2014   Backache, unspecified 01/07/2014   Ehlers-Danlos syndrome 01/07/2014    Conditions to be addressed/monitored:  Acute metabolic encephalopathy   - Seizure disorder (HCC)  - Psychosis, unspecified psychosis type ([place])   Limited social support  Care Plan : General Social Work (Adult)  Updates made by [employer], Darla Lesches, LCSW since 09/22/2021 12:00 AM     Problem: CHL AMB "PATIENT-SPECIFIC PROBLEM"   Note:   CARE PLAN ENTRY (see longitudinal plan of care for additional care plan information)  Current Barriers:  Patient with Acute metabolic encephalopathy, Seizure disorder ([place]) and Psychosis, unspecified psychosis type ([place]) in need of assistance with connection to community resources -psychosis continues per patient's mother, patient now not speaking verbally Knowledge deficits and need for support, education and care coordination related to community resource support  Limited social support, Mental Health Concerns , and Lacks knowledge of community resource: related to patient's supervision needs- Patient's mother is unable to leave patient alone without  supervision  Clinical Goal(s)  Over the next 90 days, patient will work with care management team member to address concerns related to patient's supervision needs in the home   Interventions provided by LCSW:  Continued to assessed patient's care coordination needs related to supervision needs in the absence of her parent and discussed ongoing care management follow up  Patient's mother confirmed that patient is now eating and drinking and will wait for patient to be evaluated by the psychiatrist before re-starting medications-confirmed no plan to move forward with the IVC due to improvement in eating and drinking Patient's mother confirmed that patient refused to attend psychiatrist appointment on 09/09/21, however appointment cannot be re-scheduled until she completes the new patient appointment packet-plan is to complete the paper work and return on 09/23/21-patient's mother to consider video visit and will request consultation with PCP Palliative care consult completed  Active listening / Reflection utilized  Emotional Support and encouragement provided Caregiver Stress acknowledged Encouraged follow up with Psychiatrist  Patient Self Care Activities & Deficits:  Patient is unable to independently navigate community resource options without care coordination support  Acknowledges deficits and is motivated to resolve concern  Lacks social connections Performs ADL's independently  Please see past updates related to this goal by clicking on the "Past Updates" button in the selected goal         Follow Up Plan: SW will follow up with patient by phone over the next 14 business days.       Elliot Gurney, [place] Worker  [facility] Care Management [phone]

## 2021-09-22 NOTE — Telephone Encounter (Signed)
I called Ms. Chouinard mother Vinnie Level, message left with contact information to return call

## 2021-09-22 NOTE — Patient Instructions (Signed)
Visit Information  Thank you for taking time to visit with me today. Please don't hesitate to contact me if I can be of assistance to you before our next scheduled telephone appointment.  Following are the goals we discussed today:   - begin a notebook of services in my neighborhood or community - follow-up on any referrals for help I am given - think ahead to make sure my need does not become an emergency - have a back-up plan  -Follow up with the [facility] for additional service options [place] -Mother in the process of completing new patient packet for appointment with psychiatrist at [facility] next appointment is by telephone on 10/07/21 at 2pm  Please call the care guide team at [phone] if you need to cancel or reschedule your appointment.   If you are experiencing a Mental Health or [place] or need someone to talk to, please call the Suicide and Crisis Lifeline: 988   Patient verbalizes understanding of instructions and care plan provided today and agrees to view in [place]. Active MyChart status confirmed with patient.    Telephone follow up appointment with care management team member scheduled for: 10/07/21   Elliot Gurney, [place] Worker  [facility] [phone]

## 2021-09-23 ENCOUNTER — Telehealth: Payer: BC Managed Care – PPO

## 2021-09-28 ENCOUNTER — Telehealth: Payer: Self-pay

## 2021-09-28 ENCOUNTER — Telehealth (INDEPENDENT_AMBULATORY_CARE_PROVIDER_SITE_OTHER): Payer: Medicare Other | Admitting: Family Medicine

## 2021-09-28 ENCOUNTER — Telehealth: Payer: Self-pay | Admitting: Family Medicine

## 2021-09-28 DIAGNOSIS — F061 Catatonic disorder due to known physiological condition: Secondary | ICD-10-CM

## 2021-09-28 DIAGNOSIS — F22 Delusional disorders: Secondary | ICD-10-CM | POA: Diagnosis not present

## 2021-09-28 NOTE — Progress Notes (Signed)
Virtual telephone visit    Virtual Visit via Telephone Note   This visit type was conducted due to national recommendations for restrictions regarding the COVID-19 Pandemic (e.g. social distancing) in an effort to limit this patient's exposure and mitigate transmission in our community. Due to her co-morbid illnesses, this patient is at least at moderate risk for complications without adequate follow up. This format is felt to be most appropriate for this patient at this time. The patient did not have access to video technology or had technical difficulties with video requiring transitioning to audio format only (telephone). Physical exam was limited to content and character of the telephone converstion.    Patient location: home Provider location: [facility]  I discussed the limitations of evaluation and management by telemedicine and the availability of in person appointments. She expressed understanding and agreed to proceed.   Visit Date: 09/28/2021  Today's healthcare provider: Lelon Huh, MD   Chief Complaint  Patient presents with   Altered Mental Status   Subjective    HPI  Altered Mental status: Patient's mom reports that her daughter has been in a state of psychosis for the past 4 months. Patient has not taken any of her medications in 4 months. Patients mom reports that patient has not spoken in 5 days and only gets up once a day. Patient poured out the milk because she believed it was poison. Patient's mom has to stay with patient 24 hours a day. She is confined to motorized wheelchair due to history closed head injury and Ehler's Danlos syndrome    Medications: Outpatient Medications Prior to Visit  Medication Sig   albuterol (VENTOLIN HFA) 108 (90 Base) MCG/ACT inhaler Inhale 2 puffs into the lungs every 6 (six) hours as needed. (Patient not taking: Reported on 09/28/2021)   azelastine (OPTIVAR) 0.05 % ophthalmic solution Apply to eye as directed. 1 drop in each eye prn  (Patient not taking: Reported on 08/04/2021)   busPIRone (BUSPAR) 10 MG tablet Take 1 tablet (10 mg total) by mouth 2 (two) times daily. Take along with 15mg  tablet twice a day (Patient not taking: Reported on 09/28/2021)   busPIRone (BUSPAR) 15 MG tablet TAKE 1 TABLET BY MOUTH TWICE A DAY (Patient not taking: Reported on 09/28/2021)   celecoxib (CELEBREX) 200 MG capsule TAKE 1 CAPSULE BY MOUTH TWICE A DAY (Patient not taking: Reported on 09/28/2021)   cyclobenzaprine (FLEXERIL) 5 MG tablet Take 1 tablet (5 mg total) by mouth 3 (three) times daily as needed for muscle spasms. Do not take within 6 hours of taking tizandine (Patient not taking: Reported on 09/28/2021)   diclofenac Sodium (VOLTAREN) 1 % GEL APPLY AS NEEDED 3 TIMES A DAY (Patient not taking: Reported on 09/28/2021)   DULoxetine (CYMBALTA) 60 MG capsule TAKE 2 CAPSULES BY MOUTH EVERY DAY (Patient not taking: Reported on 09/28/2021)   fentaNYL (DURAGESIC) 100 MCG/HR Place 1 patch onto the skin every 3 (three) days. (Patient not taking: Reported on 09/28/2021)   fexofenadine (ALLEGRA) 180 MG tablet Take 180 mg by mouth daily. (Patient not taking: Reported on 09/28/2021)   fluticasone (FLONASE) 50 MCG/ACT nasal spray 2 sprays into each nostril Two (2) times a day. 2 sprays each nostril BID. DISP# 2 bottles = 1 month supply (Patient not taking: Reported on 08/04/2021)   fluticasone (FLOVENT HFA) 110 MCG/ACT inhaler Inhale 2 puffs into the lungs 2 (two) times daily. (Patient not taking: Reported on 09/28/2021)   Fluticasone-Salmeterol (ADVAIR) 250-50 MCG/DOSE AEPB Inhale 1  puff into the lungs every 12 (twelve) hours. Rinse mouth after use (Patient not taking: Reported on 04/27/2021)   gabapentin (NEURONTIN) 600 MG tablet TAKE 2 TABLETS IN THE MORNING AND 3 TABLETS AT BEDTIME (Patient not taking: Reported on 09/28/2021)   haloperidol (HALDOL) 0.5 MG tablet Take 0.5 tablets (0.25 mg total) by mouth 2 (two) times daily. (Patient not taking: Reported on  09/28/2021)   haloperidol (HALDOL) 1 MG tablet Take 1 mg by mouth 2 (two) times daily. Patient states she should be and has been on 5 mg twice daily. (Patient not taking: Reported on 06/22/2021)   naloxone [facility]) nasal spray 4 mg/0.1 mL Place 1 spray into the nose as needed. (Patient not taking: Reported on 08/04/2021)   omeprazole (PRILOSEC) 40 MG capsule Take 1 capsule (40 mg total) by mouth 2 (two) times daily. (Patient not taking: Reported on 09/28/2021)   ondansetron (ZOFRAN-ODT) 4 MG disintegrating tablet DISSOLVE 1 TABLET IN MOUTH EVERY 8 HOURS FOR NAUSEA AND VOMITING (Patient not taking: Reported on 09/28/2021)   pregabalin (LYRICA) 300 MG capsule Take 1 capsule (300 mg total) by mouth 2 (two) times daily. (Patient not taking: Reported on 09/28/2021)   QUEtiapine (SEROQUEL) 200 MG tablet TAKE 1 TABLET BY MOUTH AT BEDTIME. (Patient not taking: Reported on 08/02/2021)   Sod Picosulfate-Mag Ox-Cit Acd (CLENPIQ) 10-3.5-12 MG-GM -GM/160ML SOLN Take 320 mLs by mouth as directed. (Patient not taking: Reported on 08/04/2021)   tamsulosin (FLOMAX) 0.4 MG CAPS capsule Take 1 capsule (0.4 mg total) by mouth daily. (Patient not taking: Reported on 09/28/2021)   tiZANidine (ZANAFLEX) 4 MG tablet TAKE 0.5-1 TABLETS (2-4 MG TOTAL) BY MOUTH EVERY 8 (EIGHT) HOURS AS NEEDED FOR MUSCLE SPASMS. (Patient not taking: Reported on 08/04/2021)   [DISCONTINUED] cephALEXin (KEFLEX) 500 MG capsule Take 1 capsule (500 mg total) by mouth 2 (two) times daily. (Patient not taking: Reported on 06/22/2021)   No facility-administered medications prior to visit.    Review of Systems  Constitutional:  Negative for appetite change, chills, fatigue and fever.  Respiratory:  Negative for chest tightness and shortness of breath.   Cardiovascular:  Negative for chest pain and palpitations.  Gastrointestinal:  Negative for abdominal pain, nausea and vomiting.  Neurological:  Negative for dizziness and weakness.   Psychiatric/Behavioral:  Positive for behavioral problems and confusion.      Objective    There were no vitals taken for this visit.     Assessment & Plan     1. Delusional disorder ([place])   2. Catatonic state   Progressive psychotic symptoms since August 2022 with two hospitalizations and multiple ER visits. Unfortunately patient will not allow her mother to take her for psychiatry follow up so she has not been able to establish with an outpatient psychiatrist. She was not felt to meet criteria for IVC when seen at ER. Patient will not take any of her chronic medications, and goes days on end without speaking or eating. Her mother reports a 30 pound weight loss since onset of symptoms. She is afraid that patient is going to starve herself to death or harm herself. She was unable to get video portion of visit to work so I cannot visually assess patient's state, but she does not speak on the phone. Her baseline prior to onset of psychosis was a normal awake, alert, oriented and conversant patient.   Patient's mother has been working with home health Social Worker, but I really need a clinical evaluation to make any decisions  regarding her treatment. Reviewed ER evaluations which is consistent with a delusional disorder. Since patient is unable to bring herself to office for evaluation will arrange for Home Health RN visit and consider parenteral antipsychotics.      I discussed the assessment and treatment plan with the patient's mother. She was provided an opportunity to ask questions and all were answered. She agreed with the plan and demonstrated an understanding of the instructions.   She was advised to call back or seek an in-person evaluation if the symptoms worsen or if the condition fails to improve as anticipated.  I provided 25 minutes of non-face-to-face time during this encounter.  The entirety of the information documented in the History of Present Illness, Review of Systems  and Physical Exam were personally obtained by me. Portions of this information were initially documented by the CMA and reviewed by me for thoroughness and accuracy.    Lelon Huh, MD [facility] [phone] (phone) [phone] (fax)  [place]

## 2021-09-28 NOTE — Telephone Encounter (Signed)
Patient's insurance denied home health referral because they require either in-person or video visit be done first. She will need schedule and figure out how to connect with a video visit instead of just a telephone visit like we had today.

## 2021-09-28 NOTE — Telephone Encounter (Signed)
Copied from [place] [id number]. Topic: General - Other >> Sep 28, 2021  1:55 PM Yvette Rack wrote: Reason for CRM: Pt stated she had a missed call so she returned the call. Attempted to transfer pt but there was no answer. Pt requests call back

## 2021-09-29 ENCOUNTER — Telehealth: Payer: Self-pay

## 2021-09-29 NOTE — Telephone Encounter (Signed)
I advised patient's mom Karen [name] of message below. Karen [name] says, she has a computer that has video access, but no sound. Karen [name] wants to know if she could simultaneously use her computer for the video and have Dr. Caryn Section call her on her cell phone for the sound ? She does not have a smart phone to do a video visit. Please advise if this is ok to do.

## 2021-09-29 NOTE — Telephone Encounter (Signed)
Copied from [place] [id number]. Topic: General - Other >> Sep 28, 2021  4:47 PM Parke Poisson wrote: Reason for CRM: Santiago Glad at [place] states for ind=surance to cover home health care pt must either come into office or have a video visit.Please advise,Thanks

## 2021-09-30 NOTE — Telephone Encounter (Addendum)
Yes, that's fine, but I have to document that I can see Karen [name] on the video in order for insurance to consider it a video visit.

## 2021-09-30 NOTE — Telephone Encounter (Signed)
Patient's mom advised. Appt scheduled for 10/01/2021 at 1:20pm.

## 2021-10-01 ENCOUNTER — Telehealth (INDEPENDENT_AMBULATORY_CARE_PROVIDER_SITE_OTHER): Payer: Medicare Other | Admitting: Family Medicine

## 2021-10-01 DIAGNOSIS — F22 Delusional disorders: Secondary | ICD-10-CM | POA: Diagnosis not present

## 2021-10-01 DIAGNOSIS — F061 Catatonic disorder due to known physiological condition: Secondary | ICD-10-CM

## 2021-10-01 DIAGNOSIS — G40909 Epilepsy, unspecified, not intractable, without status epilepticus: Secondary | ICD-10-CM

## 2021-10-01 DIAGNOSIS — F29 Unspecified psychosis not due to a substance or known physiological condition: Secondary | ICD-10-CM | POA: Diagnosis not present

## 2021-10-01 DIAGNOSIS — F418 Other specified anxiety disorders: Secondary | ICD-10-CM

## 2021-10-01 NOTE — Progress Notes (Signed)
MyChart Video Visit    Virtual Visit via Video Note   This visit type was conducted due to national recommendations for restrictions regarding the COVID-19 Pandemic (e.g. social distancing) in an effort to limit this patient's exposure and mitigate transmission in our community. This patient is at least at moderate risk for complications without adequate follow up. This format is felt to be most appropriate for this patient at this time. Physical exam was limited by quality of the video and audio technology used for the visit.   Patient location: home Provider location: [facility]  I discussed the limitations of evaluation and management by telemedicine and the availability of in person appointments. The patient expressed understanding and agreed to proceed.  Patient: Karen [name]   DOB: 01/13/1976   46 y.o. Female  MRN: [id number] Visit Date: 10/01/2021  Today's healthcare provider: Lelon Huh, MD   Chief Complaint  Patient presents with   Altered Mental Status   Subjective    HPI  Patient's mom reports that her daughter has been in a state of psychosis for the past 4 months. Patient has not taken any of her medications in 4 months. Patients mom reports that patient has not spoken in 5 days and only gets up once a day. Patient poured out the milk because she believed it was poison. Patient's mom has to stay with patient 24 hours a day. She is confined to motorized wheelchair due to history closed head injury and Ehler's Danlos syndrome. She mostly stays in bed curled up with her eyes closes and never acknowledging or communicating with her anyone including her mother who is her full time caregiver. Her mother leaves out food and drink which the patient apparently consumes when her mother leaves the room.    She has previously expressed multiple bizarre paranoid thinking including idea that roundworms are eating her brain and alien visitations.     Medications: Outpatient  Medications Prior to Visit  Medication Sig   albuterol (VENTOLIN HFA) 108 (90 Base) MCG/ACT inhaler Inhale 2 puffs into the lungs every 6 (six) hours as needed. (Patient not taking: Reported on 10/01/2021)   azelastine (OPTIVAR) 0.05 % ophthalmic solution Apply to eye as directed. 1 drop in each eye prn (Patient not taking: Reported on 10/01/2021)   busPIRone (BUSPAR) 10 MG tablet Take 1 tablet (10 mg total) by mouth 2 (two) times daily. Take along with 15mg  tablet twice a day (Patient not taking: Reported on 10/01/2021)   busPIRone (BUSPAR) 15 MG tablet TAKE 1 TABLET BY MOUTH TWICE A DAY (Patient not taking: Reported on 10/01/2021)   celecoxib (CELEBREX) 200 MG capsule TAKE 1 CAPSULE BY MOUTH TWICE A DAY (Patient not taking: Reported on 10/01/2021)   cyclobenzaprine (FLEXERIL) 5 MG tablet Take 1 tablet (5 mg total) by mouth 3 (three) times daily as needed for muscle spasms. Do not take within 6 hours of taking tizandine (Patient not taking: Reported on 10/01/2021)   diclofenac Sodium (VOLTAREN) 1 % GEL APPLY AS NEEDED 3 TIMES A DAY (Patient not taking: Reported on 10/01/2021)   DULoxetine (CYMBALTA) 60 MG capsule TAKE 2 CAPSULES BY MOUTH EVERY DAY (Patient not taking: Reported on 10/01/2021)   fentaNYL (DURAGESIC) 100 MCG/HR Place 1 patch onto the skin every 3 (three) days. (Patient not taking: Reported on 10/01/2021)   fexofenadine (ALLEGRA) 180 MG tablet Take 180 mg by mouth daily. (Patient not taking: Reported on 10/01/2021)   fluticasone (FLONASE) 50 MCG/ACT nasal spray  (Patient not  taking: Reported on 10/01/2021)   fluticasone (FLOVENT HFA) 110 MCG/ACT inhaler Inhale 2 puffs into the lungs 2 (two) times daily. (Patient not taking: Reported on 10/01/2021)   Fluticasone-Salmeterol (ADVAIR) 250-50 MCG/DOSE AEPB Inhale 1 puff into the lungs every 12 (twelve) hours. Rinse mouth after use (Patient not taking: Reported on 10/01/2021)   gabapentin (NEURONTIN) 600 MG tablet TAKE 2 TABLETS IN THE MORNING AND 3  TABLETS AT BEDTIME (Patient not taking: Reported on 10/01/2021)   haloperidol (HALDOL) 0.5 MG tablet Take 0.5 tablets (0.25 mg total) by mouth 2 (two) times daily. (Patient not taking: Reported on 10/01/2021)   haloperidol (HALDOL) 1 MG tablet Take 1 mg by mouth 2 (two) times daily. Patient states she should be and has been on 5 mg twice daily. (Patient not taking: Reported on 10/01/2021)   naloxone [facility]) nasal spray 4 mg/0.1 mL Place 1 spray into the nose as needed. (Patient not taking: Reported on 10/01/2021)   omeprazole (PRILOSEC) 40 MG capsule Take 1 capsule (40 mg total) by mouth 2 (two) times daily. (Patient not taking: Reported on 10/01/2021)   ondansetron (ZOFRAN-ODT) 4 MG disintegrating tablet DISSOLVE 1 TABLET IN MOUTH EVERY 8 HOURS FOR NAUSEA AND VOMITING (Patient not taking: Reported on 10/01/2021)   pregabalin (LYRICA) 300 MG capsule Take 1 capsule (300 mg total) by mouth 2 (two) times daily. (Patient not taking: Reported on 10/01/2021)   QUEtiapine (SEROQUEL) 200 MG tablet TAKE 1 TABLET BY MOUTH AT BEDTIME. (Patient not taking: Reported on 10/01/2021)   Sod Picosulfate-Mag Ox-Cit Acd (CLENPIQ) 10-3.5-12 MG-GM -GM/160ML SOLN Take 320 mLs by mouth as directed. (Patient not taking: Reported on 10/01/2021)   tamsulosin (FLOMAX) 0.4 MG CAPS capsule Take 1 capsule (0.4 mg total) by mouth daily. (Patient not taking: Reported on 10/01/2021)   tiZANidine (ZANAFLEX) 4 MG tablet TAKE 0.5-1 TABLETS (2-4 MG TOTAL) BY MOUTH EVERY 8 (EIGHT) HOURS AS NEEDED FOR MUSCLE SPASMS. (Patient not taking: Reported on 10/01/2021)   No facility-administered medications prior to visit.    Review of Systems  Constitutional:  Negative for appetite change, chills, fatigue and fever.  Respiratory:  Negative for chest tightness and shortness of breath.   Cardiovascular:  Negative for chest pain and palpitations.  Gastrointestinal:  Negative for abdominal pain, nausea and vomiting.  Neurological:  Negative for dizziness  and weakness.  Psychiatric/Behavioral:  Positive for confusion and decreased concentration.      Objective    There were no vitals taken for this visit.   Physical Exam   Patient lying on her bed in fetal position does not respond to verbal communication. Eyes mostly closed but occasionally blinks.     Assessment & Plan     1. Delusional disorder ([place])   2. Catatonic state   3. Seizure disorder ([place])   4. Psychosis, unspecified psychosis type ([place])   5. Depression with anxiety     Progressive psychotic symptoms since August 2022 with two hospitalizations and multiple ER visits. Unfortunately patient will not allow her mother to take her for psychiatry follow up so she has not been able to establish with an outpatient psychiatrist. She was not felt to meet criteria for IVC when seen at ER. Patient will not take any of her chronic medications, and goes days on end without speaking or eating. Her mother reports a 30 pound weight loss since onset of symptoms. She is afraid that patient is going to starve herself to death or harm herself. She was unable to get video  portion of visit to work so I cannot visually assess patient's state, but she does not speak on the phone. Her baseline prior to onset of psychosis was a normal awake, alert, oriented and conversant patient.    Patient's mother has been working with home health Social Worker, but I really need a clinical evaluation to make any decisions regarding her treatment. Reviewed ER evaluations which is consistent with a delusional disorder. Since patient is unable to bring herself to office for evaluation will arrange for Home Health RN visit and consider parenteral antipsychotics.         I discussed the assessment and treatment plan with the patient's mother. She was provided an opportunity to ask questions and all were answered. She agreed with the plan and demonstrated an understanding of the instructions.   She was advised  to call back or seek an in-person evaluation if the symptoms worsen or if the condition fails to improve as anticipated.    I provided 10 minutes of non-face-to-face time during this encounter.  The entirety of the information documented in the History of Present Illness, Review of Systems and Physical Exam were personally obtained by me. Portions of this information were initially documented by the CMA and reviewed by me for thoroughness and accuracy.    Lelon Huh, MD [facility] [phone] (phone) [phone] (fax)  [place]

## 2021-10-04 ENCOUNTER — Emergency Department: Payer: Medicare Other

## 2021-10-04 ENCOUNTER — Other Ambulatory Visit: Payer: Self-pay

## 2021-10-04 ENCOUNTER — Ambulatory Visit: Payer: Self-pay

## 2021-10-04 ENCOUNTER — Inpatient Hospital Stay
Admission: EM | Admit: 2021-10-04 | Discharge: 2021-10-27 | DRG: 872 | Disposition: A | Discharge status: Home / Self Care | Payer: Medicare Other | Attending: Internal Medicine | Admitting: Internal Medicine

## 2021-10-04 ENCOUNTER — Encounter: Payer: Self-pay | Admitting: Emergency Medicine

## 2021-10-04 ENCOUNTER — Telehealth (HOSPITAL_COMMUNITY): Payer: Self-pay | Admitting: Family Medicine

## 2021-10-04 DIAGNOSIS — I700 Atherosclerosis of aorta: Secondary | ICD-10-CM | POA: Diagnosis not present

## 2021-10-04 DIAGNOSIS — Z818 Family history of other mental and behavioral disorders: Secondary | ICD-10-CM

## 2021-10-04 DIAGNOSIS — R778 Other specified abnormalities of plasma proteins: Secondary | ICD-10-CM | POA: Diagnosis present

## 2021-10-04 DIAGNOSIS — Z0389 Encounter for observation for other suspected diseases and conditions ruled out: Secondary | ICD-10-CM | POA: Diagnosis not present

## 2021-10-04 DIAGNOSIS — F29 Unspecified psychosis not due to a substance or known physiological condition: Secondary | ICD-10-CM | POA: Diagnosis not present

## 2021-10-04 DIAGNOSIS — Z823 Family history of stroke: Secondary | ICD-10-CM | POA: Diagnosis not present

## 2021-10-04 DIAGNOSIS — E872 Acidosis, unspecified: Secondary | ICD-10-CM | POA: Diagnosis present

## 2021-10-04 DIAGNOSIS — N390 Urinary tract infection, site not specified: Secondary | ICD-10-CM | POA: Diagnosis not present

## 2021-10-04 DIAGNOSIS — D649 Anemia, unspecified: Secondary | ICD-10-CM | POA: Diagnosis present

## 2021-10-04 DIAGNOSIS — R5381 Other malaise: Secondary | ICD-10-CM | POA: Diagnosis not present

## 2021-10-04 DIAGNOSIS — F331 Major depressive disorder, recurrent, moderate: Secondary | ICD-10-CM | POA: Diagnosis not present

## 2021-10-04 DIAGNOSIS — R0689 Other abnormalities of breathing: Secondary | ICD-10-CM | POA: Diagnosis not present

## 2021-10-04 DIAGNOSIS — R404 Transient alteration of awareness: Secondary | ICD-10-CM | POA: Diagnosis not present

## 2021-10-04 DIAGNOSIS — E860 Dehydration: Secondary | ICD-10-CM | POA: Diagnosis present

## 2021-10-04 DIAGNOSIS — Z803 Family history of malignant neoplasm of breast: Secondary | ICD-10-CM

## 2021-10-04 DIAGNOSIS — Z9114 Patient's other noncompliance with medication regimen: Secondary | ICD-10-CM | POA: Diagnosis not present

## 2021-10-04 DIAGNOSIS — M797 Fibromyalgia: Secondary | ICD-10-CM | POA: Diagnosis present

## 2021-10-04 DIAGNOSIS — Z8049 Family history of malignant neoplasm of other genital organs: Secondary | ICD-10-CM

## 2021-10-04 DIAGNOSIS — Z4659 Encounter for fitting and adjustment of other gastrointestinal appliance and device: Secondary | ICD-10-CM

## 2021-10-04 DIAGNOSIS — A419 Sepsis, unspecified organism: Principal | ICD-10-CM | POA: Diagnosis present

## 2021-10-04 DIAGNOSIS — I248 Other forms of acute ischemic heart disease: Secondary | ICD-10-CM | POA: Diagnosis present

## 2021-10-04 DIAGNOSIS — Z8249 Family history of ischemic heart disease and other diseases of the circulatory system: Secondary | ICD-10-CM

## 2021-10-04 DIAGNOSIS — F419 Anxiety disorder, unspecified: Secondary | ICD-10-CM | POA: Diagnosis present

## 2021-10-04 DIAGNOSIS — F061 Catatonic disorder due to known physiological condition: Secondary | ICD-10-CM | POA: Diagnosis present

## 2021-10-04 DIAGNOSIS — R4182 Altered mental status, unspecified: Secondary | ICD-10-CM | POA: Diagnosis not present

## 2021-10-04 DIAGNOSIS — R69 Illness, unspecified: Secondary | ICD-10-CM | POA: Diagnosis not present

## 2021-10-04 DIAGNOSIS — M1990 Unspecified osteoarthritis, unspecified site: Secondary | ICD-10-CM | POA: Diagnosis not present

## 2021-10-04 DIAGNOSIS — R569 Unspecified convulsions: Secondary | ICD-10-CM

## 2021-10-04 DIAGNOSIS — F333 Major depressive disorder, recurrent, severe with psychotic symptoms: Secondary | ICD-10-CM | POA: Diagnosis present

## 2021-10-04 DIAGNOSIS — Z20822 Contact with and (suspected) exposure to covid-19: Secondary | ICD-10-CM | POA: Diagnosis present

## 2021-10-04 DIAGNOSIS — R4020 Unspecified coma: Secondary | ICD-10-CM | POA: Diagnosis not present

## 2021-10-04 DIAGNOSIS — E440 Moderate protein-calorie malnutrition: Secondary | ICD-10-CM | POA: Diagnosis not present

## 2021-10-04 DIAGNOSIS — Z800 Family history of malignant neoplasm of digestive organs: Secondary | ICD-10-CM | POA: Diagnosis not present

## 2021-10-04 DIAGNOSIS — E041 Nontoxic single thyroid nodule: Secondary | ICD-10-CM | POA: Diagnosis present

## 2021-10-04 DIAGNOSIS — Z7189 Other specified counseling: Secondary | ICD-10-CM | POA: Diagnosis not present

## 2021-10-04 DIAGNOSIS — R9431 Abnormal electrocardiogram [ECG] [EKG]: Secondary | ICD-10-CM | POA: Diagnosis not present

## 2021-10-04 DIAGNOSIS — R4189 Other symptoms and signs involving cognitive functions and awareness: Secondary | ICD-10-CM | POA: Diagnosis not present

## 2021-10-04 DIAGNOSIS — E876 Hypokalemia: Secondary | ICD-10-CM | POA: Diagnosis present

## 2021-10-04 DIAGNOSIS — F321 Major depressive disorder, single episode, moderate: Secondary | ICD-10-CM | POA: Diagnosis not present

## 2021-10-04 DIAGNOSIS — R918 Other nonspecific abnormal finding of lung field: Secondary | ICD-10-CM | POA: Diagnosis not present

## 2021-10-04 DIAGNOSIS — R627 Adult failure to thrive: Secondary | ICD-10-CM | POA: Diagnosis present

## 2021-10-04 DIAGNOSIS — Z91199 Patient's noncompliance with other medical treatment and regimen due to unspecified reason: Secondary | ICD-10-CM

## 2021-10-04 DIAGNOSIS — G4730 Sleep apnea, unspecified: Secondary | ICD-10-CM | POA: Diagnosis present

## 2021-10-04 DIAGNOSIS — Z993 Dependence on wheelchair: Secondary | ICD-10-CM

## 2021-10-04 DIAGNOSIS — Z4682 Encounter for fitting and adjustment of non-vascular catheter: Secondary | ICD-10-CM | POA: Diagnosis not present

## 2021-10-04 DIAGNOSIS — Z825 Family history of asthma and other chronic lower respiratory diseases: Secondary | ICD-10-CM | POA: Diagnosis not present

## 2021-10-04 DIAGNOSIS — Q796 Ehlers-Danlos syndrome, unspecified: Secondary | ICD-10-CM | POA: Diagnosis not present

## 2021-10-04 DIAGNOSIS — R000 Tachycardia, unspecified: Secondary | ICD-10-CM | POA: Diagnosis not present

## 2021-10-04 DIAGNOSIS — Z515 Encounter for palliative care: Secondary | ICD-10-CM | POA: Diagnosis not present

## 2021-10-04 DIAGNOSIS — Z743 Need for continuous supervision: Secondary | ICD-10-CM | POA: Diagnosis not present

## 2021-10-04 DIAGNOSIS — Z79899 Other long term (current) drug therapy: Secondary | ICD-10-CM

## 2021-10-04 DIAGNOSIS — Z6823 Body mass index (BMI) 23.0-23.9, adult: Secondary | ICD-10-CM

## 2021-10-04 DIAGNOSIS — G40909 Epilepsy, unspecified, not intractable, without status epilepticus: Secondary | ICD-10-CM

## 2021-10-04 DIAGNOSIS — J45909 Unspecified asthma, uncomplicated: Secondary | ICD-10-CM | POA: Diagnosis not present

## 2021-10-04 DIAGNOSIS — R7989 Other specified abnormal findings of blood chemistry: Secondary | ICD-10-CM | POA: Diagnosis present

## 2021-10-04 DIAGNOSIS — G4739 Other sleep apnea: Secondary | ICD-10-CM | POA: Diagnosis not present

## 2021-10-04 DIAGNOSIS — F418 Other specified anxiety disorders: Secondary | ICD-10-CM | POA: Diagnosis present

## 2021-10-04 LAB — CBC WITH DIFFERENTIAL/PLATELET
Abs Immature Granulocytes: 0.05 10*3/uL (ref 0.00–0.07)
Basophils Absolute: 0 10*3/uL (ref 0.0–0.1)
Basophils Relative: 0 %
Eosinophils Absolute: 0.1 10*3/uL (ref 0.0–0.5)
Eosinophils Relative: 1 %
HCT: 49.6 % — ABNORMAL HIGH (ref 36.0–46.0)
Hemoglobin: 16.2 g/dL — ABNORMAL HIGH (ref 12.0–15.0)
Immature Granulocytes: 0 %
Lymphocytes Relative: 15 %
Lymphs Abs: 1.8 10*3/uL (ref 0.7–4.0)
MCH: 27.3 pg (ref 26.0–34.0)
MCHC: 32.7 g/dL (ref 30.0–36.0)
MCV: 83.6 fL (ref 80.0–100.0)
Monocytes Absolute: 0.6 10*3/uL (ref 0.1–1.0)
Monocytes Relative: 5 %
Neutro Abs: 9.9 10*3/uL — ABNORMAL HIGH (ref 1.7–7.7)
Neutrophils Relative %: 79 %
Platelets: 378 10*3/uL (ref 150–400)
RBC: 5.93 MIL/uL — ABNORMAL HIGH (ref 3.87–5.11)
RDW: 15.7 % — ABNORMAL HIGH (ref 11.5–15.5)
WBC: 12.5 10*3/uL — ABNORMAL HIGH (ref 4.0–10.5)
nRBC: 0 % (ref 0.0–0.2)

## 2021-10-04 LAB — RESP PANEL BY RT-PCR (FLU A&B, COVID) ARPGX2
Influenza A by PCR: NEGATIVE
Influenza B by PCR: NEGATIVE
SARS Coronavirus 2 by RT PCR: NEGATIVE

## 2021-10-04 LAB — URINALYSIS, ROUTINE W REFLEX MICROSCOPIC
Bilirubin Urine: NEGATIVE
Glucose, UA: NEGATIVE mg/dL
Hgb urine dipstick: NEGATIVE
Ketones, ur: NEGATIVE mg/dL
Leukocytes,Ua: NEGATIVE
Nitrite: POSITIVE — AB
Protein, ur: NEGATIVE mg/dL
Specific Gravity, Urine: 1.024 (ref 1.005–1.030)
Squamous Epithelial / HPF: NONE SEEN (ref 0–5)
pH: 5 (ref 5.0–8.0)

## 2021-10-04 LAB — BASIC METABOLIC PANEL
Anion gap: 10 (ref 5–15)
BUN: 32 mg/dL — ABNORMAL HIGH (ref 6–20)
CO2: 28 mmol/L (ref 22–32)
Calcium: 8.6 mg/dL — ABNORMAL LOW (ref 8.9–10.3)
Chloride: 107 mmol/L (ref 98–111)
Creatinine, Ser: 0.83 mg/dL (ref 0.44–1.00)
GFR, Estimated: >60 mL/min (ref >60)
Glucose, Bld: 141 mg/dL — ABNORMAL HIGH (ref 70–99)
Potassium: 3.3 mmol/L — ABNORMAL LOW (ref 3.5–5.1)
Sodium: 145 mmol/L (ref 135–145)

## 2021-10-04 LAB — HEPATIC FUNCTION PANEL
ALT: 30 U/L (ref 0–44)
AST: 41 U/L (ref 15–41)
Albumin: 3.6 g/dL (ref 3.5–5.0)
Alkaline Phosphatase: 94 U/L (ref 38–126)
Bilirubin, Direct: 0.1 mg/dL (ref 0.0–0.2)
Indirect Bilirubin: 0.5 mg/dL (ref 0.3–0.9)
Total Bilirubin: 0.6 mg/dL (ref 0.3–1.2)
Total Protein: 6.9 g/dL (ref 6.5–8.1)

## 2021-10-04 LAB — TROPONIN I (HIGH SENSITIVITY)
Troponin I (High Sensitivity): 167 ng/L (ref <18)
Troponin I (High Sensitivity): 193 ng/L (ref <18)

## 2021-10-04 LAB — URINE DRUG SCREEN, QUALITATIVE (ARMC ONLY)
Amphetamines, Ur Screen: NOT DETECTED
Barbiturates, Ur Screen: NOT DETECTED
Benzodiazepine, Ur Scrn: NOT DETECTED
Cannabinoid 50 Ng, Ur ~~LOC~~: NOT DETECTED
Cocaine Metabolite,Ur ~~LOC~~: NOT DETECTED
MDMA (Ecstasy)Ur Screen: NOT DETECTED
Methadone Scn, Ur: NOT DETECTED
Opiate, Ur Screen: NOT DETECTED
Phencyclidine (PCP) Ur S: NOT DETECTED
Tricyclic, Ur Screen: NOT DETECTED

## 2021-10-04 LAB — LACTIC ACID, PLASMA
Lactic Acid, Venous: 2.4 mmol/L (ref 0.5–1.9)
Lactic Acid, Venous: 2.1 mmol/L (ref 0.5–1.9)

## 2021-10-04 LAB — ACETAMINOPHEN LEVEL: Acetaminophen (Tylenol), Serum: <10 ug/mL — ABNORMAL LOW (ref 10–30)

## 2021-10-04 LAB — SALICYLATE LEVEL: Salicylate Lvl: <7 mg/dL — ABNORMAL LOW (ref 7.0–30.0)

## 2021-10-04 LAB — ETHANOL: Alcohol, Ethyl (B): <10 mg/dL (ref <10)

## 2021-10-04 LAB — CK: Total CK: 200 U/L (ref 38–234)

## 2021-10-04 MED ORDER — SODIUM CHLORIDE 0.9 % IV BOLUS
1000.0000 mL | Freq: Once | INTRAVENOUS | Status: AC
  Administered 2021-10-04: 1000 mL via INTRAVENOUS

## 2021-10-04 MED ORDER — IOHEXOL 350 MG/ML SOLN
75.0000 mL | Freq: Once | INTRAVENOUS | Status: AC | PRN
  Administered 2021-10-04: 75 mL via INTRAVENOUS

## 2021-10-04 NOTE — ED Provider Notes (Signed)
[facility] Provider Note    Event Date/Time   First MD Initiated Contact with Patient 10/04/21 1853     (approximate)   History   Altered Mental Status   HPI  Level 5 caveat: History of present illness limited due to altered mental status/possible psychosis  Karen [name] is a 46 y.o. female with a history of Ehlers-Danlos syndrome, fibromyalgia, major depression, and psychosis who presents with altered mental status.  Per EMS, the patient's mother called them because the patient had been lying on the floor for the last several days and not moving.  She has had previous psychotic episodes with similar behavior.  Over the last day the patient has been urinating and defecating on herself.  The patient herself is unable to give any history.    Physical Exam   Triage Vital Signs: ED Triage Vitals  Enc Vitals Group     BP 10/04/21 1859 116/89     Pulse Rate 10/04/21 1930 92     Resp 10/04/21 1859 (!) 23     Temp 10/04/21 1859 (!) 96.9 F (36.1 C)     Temp Source 10/04/21 1859 Axillary     SpO2 10/04/21 1859 97 %     Weight 10/04/21 1900 134 lb 0.6 oz (60.8 kg)     Height 10/04/21 1900 5\' 3"  (1.6 m)     Head Circumference --      Peak Flow --      Pain Score --      Pain Loc --      Pain Edu? --      Excl. in [place]? --     Most recent vital signs: Vitals:   10/04/21 2100 10/04/21 2130  BP: (!) 125/92 (!) 119/92  Pulse: 100 95  Resp: 19 (!) 22  Temp:    SpO2: 99% 99%     General: Awake, actively resisting examination, comfortable appearing. CV:  Good peripheral perfusion.  Resp:  Normal effort.  Abd:  Abdomen soft and nontender.  No distention.  Other:  Grimaces to sternal rub.  Purposeful movement of all extremities to painful stimuli.  EOMI.  PERRLA.  Actively changing direction of gaze away from the light during my exam.   ED Results / Procedures / Treatments   Labs (all labs ordered are listed, but only abnormal results are  displayed) Labs Reviewed  ACETAMINOPHEN LEVEL - Abnormal; Notable for the following components:      Result Value   Acetaminophen (Tylenol), Serum <10 (*)    All other components within normal limits  BASIC METABOLIC PANEL - Abnormal; Notable for the following components:   Potassium 3.3 (*)    Glucose, Bld 141 (*)    BUN 32 (*)    Calcium 8.6 (*)    All other components within normal limits  LACTIC ACID, PLASMA - Abnormal; Notable for the following components:   Lactic Acid, Venous 2.4 (*)    All other components within normal limits  LACTIC ACID, PLASMA - Abnormal; Notable for the following components:   Lactic Acid, Venous 2.1 (*)    All other components within normal limits  SALICYLATE LEVEL - Abnormal; Notable for the following components:   Salicylate Lvl <1.4 (*)    All other components within normal limits  CBC WITH DIFFERENTIAL/PLATELET - Abnormal; Notable for the following components:   WBC 12.5 (*)    RBC 5.93 (*)    Hemoglobin 16.2 (*)    HCT 49.6 (*)  RDW 15.7 (*)    Neutro Abs 9.9 (*)    All other components within normal limits  URINALYSIS, ROUTINE W REFLEX MICROSCOPIC - Abnormal; Notable for the following components:   Color, Urine AMBER (*)    APPearance CLOUDY (*)    Nitrite POSITIVE (*)    Bacteria, UA MANY (*)    All other components within normal limits  TROPONIN I (HIGH SENSITIVITY) - Abnormal; Notable for the following components:   Troponin I (High Sensitivity) 167 (*)    All other components within normal limits  TROPONIN I (HIGH SENSITIVITY) - Abnormal; Notable for the following components:   Troponin I (High Sensitivity) 193 (*)    All other components within normal limits  RESP PANEL BY RT-PCR (FLU A&B, COVID) ARPGX2  HEPATIC FUNCTION PANEL  ETHANOL  URINE DRUG SCREEN, QUALITATIVE ([facility] ONLY)  CK     EKG  ED ECG REPORT I, Arta Silence, the attending physician, personally viewed and interpreted this ECG.  Date: 10/04/2021 EKG  Time: 1913 Rate: 97 Rhythm: normal sinus rhythm QRS Axis: Right axis Intervals: normal ST/T Wave abnormalities: normal Narrative Interpretation: no evidence of acute ischemia    RADIOLOGY  Chest x-ray: I independently viewed and interpreted the images; there is no focal consolidation or edema  CT head: I independently reviewed and interpreted the images; there is no ICH or other acute abnormality   PROCEDURES:  Critical Care performed: No  Procedures   MEDICATIONS ORDERED IN ED: Medications  sodium chloride 0.9 % bolus 1,000 mL (0 mLs Intravenous Stopped 10/04/21 2010)  sodium chloride 0.9 % bolus 1,000 mL (1,000 mLs Intravenous New Bag/Given 10/04/21 2206)     IMPRESSION / MDM / ASSESSMENT AND PLAN / ED COURSE  I reviewed the triage vital signs and the nursing notes.  46 year old female with PMH as noted above presents with altered mental status.  EMS was called out because the patient has been lying on the floor for the last several days, and mother stated that the patient has had similar psychotic episodes previously.  On exam the vital signs are normal.  The patient appears awake but is not responding verbally.  However, she does respond purposefully to painful stimuli and appears to be actively avoiding exam maneuvers.  Neurologic exam is nonfocal.  There is no visible trauma.  I reviewed the past medical records.  The patient was evaluated by psychiatry in January, and note from 1/26 indicates that at that time the patient had paranoid thoughts and declined antipsychotics but was cleared by psychiatry.  The patient is her own guardian and was refusing further treatment.  Differential diagnosis includes, but is not limited to, catatonia, other psychosis, substance abuse, rhabdomyolysis, dehydration, DKA, AKI, other metabolic abnormality, or less likely CNS cause.  Will obtain lab work-up, give fluids, and reassess.  The patient is on the cardiac monitor to evaluate for  evidence of arrhythmia and/or significant heart rate changes.  ----------------------------------------- 10:31 PM on 10/04/2021 -----------------------------------------  Lab work-up is overall consistent with UTI.  UA shows nitrites, WBCs and bacteria.  There is leukocytosis on the CBC and the lactate is slightly elevated.  However, the patient has no evidence of sepsis at this time.  I have ordered IV fluids.  Additionally, the troponin is elevated.  Repeat troponin is slightly increased.  The patient is unable to tell me whether she is having any chest pain.  EKG is nonischemic.  I suspect possible demand ischemia related to the UTI, dehydration, or  deconditioning.  Based on her mental status and lack of other symptoms the patient is not a candidate for acute cardiac intervention at this time.  We will continue to monitor.  I consulted Dr. Posey Pronto from the hospitalist service; based on our discussion she agrees to admit the patient.  After seeing the patient she is concerned for possible PE given the elevated troponin and tachycardia.  We have added on a CT angio to rule out PE.  At this time, the patient's mother is here.  The patient is alert and sitting in bed with her eyes open, making eye contact, and more interactive although still not answering questions.  Although I do suspect psychosis or other primary psychiatric issue causing her behavior, at this time she does not demonstrate danger to self or others and has been cooperative with her evaluation.  Therefore, I do not see an indication to  place her under IVC at this time.   FINAL CLINICAL IMPRESSION(S) / ED DIAGNOSES   Final diagnoses:  Elevated troponin  Urinary tract infection without hematuria, site unspecified  Altered mental status, unspecified altered mental status type     Rx / DC Orders   ED Discharge Orders     None        Note:  This document was prepared using Dragon voice recognition software and may include  unintentional dictation errors.    Arta Silence, MD 10/04/21 2237

## 2021-10-04 NOTE — Telephone Encounter (Addendum)
Difficult call. Pt's mother states pt is on the floor unresponsive, "Legs stiff as sticks." States SHE rolled pt off bed on to floor because she had been incontinent. States has been trying to give her water. Evasive historian, states does not want EMS, "Just someone to help get mattress moved. Called EMS,information provided, connected EMS with pt.'s mother. Reason for Disposition  Nursing judgment, per information in Reference  Protocols used: No Guideline Available-[facility]

## 2021-10-04 NOTE — Telephone Encounter (Signed)
Patient's mother Karen [name] called, left VM to return the call to the office to speak to a nurse.    Summary: mom asking for call from Dr Caryn Section nurse   Patient mother called in stated that she have to speak to Dr Caryn Section or his PA its an emergency refused to speak to a nurse or tell me what the issue was just that there have been a big change and she refuse to discuss with someone like me that is irrelevant to [facility] care. So Karen [name] was informed that I will ask Dr Sabino Snipes nurse to call her at Ph#  [phone]

## 2021-10-04 NOTE — ED Triage Notes (Signed)
Pt to ED via [facility] for unresponsiveness, per EMS pt mother called out for pt being unresponsive x3 days, urinating and defecating on herself. Pt has also not eaten or drank anything in 3 days either.  Pt has hx of schizophrenia, recently had episode of psychosis.  Pt is currently responsive to painful stimuli.

## 2021-10-04 NOTE — BH Assessment (Signed)
Care Management - [facility] Follow Up Discharges  ° °Writer attempted to make contact with patient today and was unsuccessful.  Writer left a HIPPA compliant voice message.  ° °Per chart review, patient was provided with outpatient resources. ° °

## 2021-10-05 ENCOUNTER — Observation Stay: Payer: Medicare Other

## 2021-10-05 ENCOUNTER — Other Ambulatory Visit: Payer: Self-pay

## 2021-10-05 ENCOUNTER — Encounter: Payer: Medicare Other | Admitting: Obstetrics & Gynecology

## 2021-10-05 ENCOUNTER — Encounter: Payer: Self-pay | Admitting: Internal Medicine

## 2021-10-05 ENCOUNTER — Inpatient Hospital Stay (HOSPITAL_COMMUNITY)
Admit: 2021-10-05 | Discharge: 2021-10-05 | Disposition: A | Discharge status: Home / Self Care | Payer: Medicare Other | Attending: Internal Medicine | Admitting: Internal Medicine

## 2021-10-05 DIAGNOSIS — I248 Other forms of acute ischemic heart disease: Secondary | ICD-10-CM

## 2021-10-05 DIAGNOSIS — F331 Major depressive disorder, recurrent, moderate: Secondary | ICD-10-CM | POA: Diagnosis not present

## 2021-10-05 DIAGNOSIS — Z9114 Patient's other noncompliance with medication regimen: Secondary | ICD-10-CM | POA: Diagnosis not present

## 2021-10-05 DIAGNOSIS — R778 Other specified abnormalities of plasma proteins: Secondary | ICD-10-CM

## 2021-10-05 DIAGNOSIS — Z7189 Other specified counseling: Secondary | ICD-10-CM | POA: Diagnosis not present

## 2021-10-05 DIAGNOSIS — F061 Catatonic disorder due to known physiological condition: Secondary | ICD-10-CM | POA: Diagnosis present

## 2021-10-05 DIAGNOSIS — J45909 Unspecified asthma, uncomplicated: Secondary | ICD-10-CM | POA: Diagnosis not present

## 2021-10-05 DIAGNOSIS — A419 Sepsis, unspecified organism: Secondary | ICD-10-CM | POA: Diagnosis present

## 2021-10-05 DIAGNOSIS — Z8049 Family history of malignant neoplasm of other genital organs: Secondary | ICD-10-CM | POA: Diagnosis not present

## 2021-10-05 DIAGNOSIS — E440 Moderate protein-calorie malnutrition: Secondary | ICD-10-CM | POA: Diagnosis present

## 2021-10-05 DIAGNOSIS — Z4659 Encounter for fitting and adjustment of other gastrointestinal appliance and device: Secondary | ICD-10-CM | POA: Diagnosis not present

## 2021-10-05 DIAGNOSIS — E876 Hypokalemia: Secondary | ICD-10-CM | POA: Diagnosis present

## 2021-10-05 DIAGNOSIS — R000 Tachycardia, unspecified: Secondary | ICD-10-CM | POA: Diagnosis not present

## 2021-10-05 DIAGNOSIS — Z743 Need for continuous supervision: Secondary | ICD-10-CM | POA: Diagnosis not present

## 2021-10-05 DIAGNOSIS — G40909 Epilepsy, unspecified, not intractable, without status epilepticus: Secondary | ICD-10-CM | POA: Diagnosis present

## 2021-10-05 DIAGNOSIS — Z515 Encounter for palliative care: Secondary | ICD-10-CM | POA: Diagnosis not present

## 2021-10-05 DIAGNOSIS — Z803 Family history of malignant neoplasm of breast: Secondary | ICD-10-CM | POA: Diagnosis not present

## 2021-10-05 DIAGNOSIS — F333 Major depressive disorder, recurrent, severe with psychotic symptoms: Secondary | ICD-10-CM | POA: Diagnosis present

## 2021-10-05 DIAGNOSIS — Z800 Family history of malignant neoplasm of digestive organs: Secondary | ICD-10-CM | POA: Diagnosis not present

## 2021-10-05 DIAGNOSIS — Z20822 Contact with and (suspected) exposure to covid-19: Secondary | ICD-10-CM | POA: Diagnosis present

## 2021-10-05 DIAGNOSIS — M797 Fibromyalgia: Secondary | ICD-10-CM | POA: Diagnosis present

## 2021-10-05 DIAGNOSIS — R627 Adult failure to thrive: Secondary | ICD-10-CM | POA: Diagnosis present

## 2021-10-05 DIAGNOSIS — Q796 Ehlers-Danlos syndrome, unspecified: Secondary | ICD-10-CM | POA: Diagnosis not present

## 2021-10-05 DIAGNOSIS — F419 Anxiety disorder, unspecified: Secondary | ICD-10-CM | POA: Diagnosis present

## 2021-10-05 DIAGNOSIS — G4739 Other sleep apnea: Secondary | ICD-10-CM | POA: Diagnosis not present

## 2021-10-05 DIAGNOSIS — R5381 Other malaise: Secondary | ICD-10-CM | POA: Diagnosis not present

## 2021-10-05 DIAGNOSIS — R69 Illness, unspecified: Secondary | ICD-10-CM | POA: Diagnosis not present

## 2021-10-05 DIAGNOSIS — R4182 Altered mental status, unspecified: Secondary | ICD-10-CM | POA: Diagnosis not present

## 2021-10-05 DIAGNOSIS — E860 Dehydration: Secondary | ICD-10-CM | POA: Diagnosis present

## 2021-10-05 DIAGNOSIS — N390 Urinary tract infection, site not specified: Secondary | ICD-10-CM | POA: Diagnosis present

## 2021-10-05 DIAGNOSIS — Z8249 Family history of ischemic heart disease and other diseases of the circulatory system: Secondary | ICD-10-CM | POA: Diagnosis not present

## 2021-10-05 DIAGNOSIS — Z823 Family history of stroke: Secondary | ICD-10-CM | POA: Diagnosis not present

## 2021-10-05 DIAGNOSIS — Z825 Family history of asthma and other chronic lower respiratory diseases: Secondary | ICD-10-CM | POA: Diagnosis not present

## 2021-10-05 DIAGNOSIS — E041 Nontoxic single thyroid nodule: Secondary | ICD-10-CM | POA: Diagnosis present

## 2021-10-05 DIAGNOSIS — Z4682 Encounter for fitting and adjustment of non-vascular catheter: Secondary | ICD-10-CM | POA: Diagnosis not present

## 2021-10-05 DIAGNOSIS — F418 Other specified anxiety disorders: Secondary | ICD-10-CM | POA: Diagnosis not present

## 2021-10-05 DIAGNOSIS — F321 Major depressive disorder, single episode, moderate: Secondary | ICD-10-CM | POA: Diagnosis not present

## 2021-10-05 DIAGNOSIS — E872 Acidosis, unspecified: Secondary | ICD-10-CM | POA: Diagnosis present

## 2021-10-05 DIAGNOSIS — Z818 Family history of other mental and behavioral disorders: Secondary | ICD-10-CM | POA: Diagnosis not present

## 2021-10-05 DIAGNOSIS — M1990 Unspecified osteoarthritis, unspecified site: Secondary | ICD-10-CM | POA: Diagnosis not present

## 2021-10-05 DIAGNOSIS — R4189 Other symptoms and signs involving cognitive functions and awareness: Secondary | ICD-10-CM | POA: Diagnosis not present

## 2021-10-05 HISTORY — DX: Catatonic disorder due to known physiological condition: F06.1

## 2021-10-05 LAB — T4, FREE: Free T4: 1.19 ng/dL — ABNORMAL HIGH (ref 0.61–1.12)

## 2021-10-05 LAB — TROPONIN I (HIGH SENSITIVITY)
Troponin I (High Sensitivity): 217 ng/L (ref <18)
Troponin I (High Sensitivity): 160 ng/L (ref <18)

## 2021-10-05 LAB — PROCALCITONIN: Procalcitonin: <0.1 ng/mL

## 2021-10-05 LAB — PROTIME-INR
INR: 1.1 (ref 0.8–1.2)
Prothrombin Time: 13.8 seconds (ref 11.4–15.2)

## 2021-10-05 LAB — TSH: TSH: 1.057 u[IU]/mL (ref 0.350–4.500)

## 2021-10-05 LAB — MAGNESIUM: Magnesium: 2.2 mg/dL (ref 1.7–2.4)

## 2021-10-05 LAB — CORTISOL-AM, BLOOD: Cortisol - AM: 15.7 ug/dL (ref 6.7–22.6)

## 2021-10-05 LAB — PHOSPHORUS: Phosphorus: 2.6 mg/dL (ref 2.5–4.6)

## 2021-10-05 MED ORDER — SODIUM CHLORIDE 0.9 % IV SOLN
1.0000 g | INTRAVENOUS | Status: AC
  Administered 2021-10-05 – 2021-10-06 (×3): 1 g via INTRAVENOUS
  Filled 2021-10-05: qty 10
  Filled 2021-10-05 (×2): qty 1

## 2021-10-05 MED ORDER — SODIUM CHLORIDE 0.9 % IV SOLN: INTRAVENOUS | Status: AC

## 2021-10-05 MED ORDER — SODIUM CHLORIDE 0.9% FLUSH
3.0000 mL | Freq: Two times a day (BID) | INTRAVENOUS | Status: DC
  Administered 2021-10-05 – 2021-10-27 (×32): 3 mL via INTRAVENOUS

## 2021-10-05 MED ORDER — ALBUTEROL SULFATE HFA 108 (90 BASE) MCG/ACT IN AERS: 2.0000 | INHALATION_SPRAY | Freq: Four times a day (QID) | RESPIRATORY_TRACT | Status: DC | PRN

## 2021-10-05 MED ORDER — ALBUTEROL SULFATE (2.5 MG/3ML) 0.083% IN NEBU
2.5000 mg | INHALATION_SOLUTION | Freq: Four times a day (QID) | RESPIRATORY_TRACT | Status: DC | PRN
Start: 2021-10-05 — End: 2021-10-27

## 2021-10-05 MED ORDER — POTASSIUM CHLORIDE IN NACL 20-0.9 MEQ/L-% IV SOLN
INTRAVENOUS | Status: DC
  Filled 2021-10-05 (×3): qty 1000

## 2021-10-05 MED ORDER — SODIUM CHLORIDE 0.9 % IV SOLN: INTRAVENOUS | Status: DC

## 2021-10-05 MED ORDER — ACETAMINOPHEN 650 MG RE SUPP: 650.0000 mg | Freq: Four times a day (QID) | RECTAL | Status: DC | PRN

## 2021-10-05 MED ORDER — HEPARIN SODIUM (PORCINE) 5000 UNIT/ML IJ SOLN
5000.0000 [IU] | Freq: Three times a day (TID) | INTRAMUSCULAR | Status: DC
  Administered 2021-10-05 – 2021-10-11 (×20): 5000 [IU] via SUBCUTANEOUS
  Filled 2021-10-05 (×19): qty 1

## 2021-10-05 MED ORDER — LORAZEPAM 2 MG/ML IJ SOLN
1.0000 mg | Freq: Four times a day (QID) | INTRAMUSCULAR | Status: DC
  Administered 2021-10-05 – 2021-10-06 (×3): 1 mg via INTRAVENOUS
  Filled 2021-10-05 (×3): qty 1

## 2021-10-05 MED ORDER — ACETAMINOPHEN 325 MG PO TABS
650.0000 mg | ORAL_TABLET | Freq: Four times a day (QID) | ORAL | Status: DC | PRN
  Administered 2021-10-15 – 2021-10-26 (×11): 650 mg via ORAL
  Filled 2021-10-05 (×11): qty 2

## 2021-10-05 MED ORDER — POTASSIUM CHLORIDE 10 MEQ/100ML IV SOLN
10.0000 meq | INTRAVENOUS | Status: AC
  Administered 2021-10-05 (×2): 10 meq via INTRAVENOUS
  Filled 2021-10-05 (×2): qty 100

## 2021-10-05 MED ORDER — POTASSIUM CHLORIDE IN NACL 40-0.9 MEQ/L-% IV SOLN
INTRAVENOUS | Status: DC
  Filled 2021-10-05: qty 1000

## 2021-10-05 MED ORDER — PANTOPRAZOLE SODIUM 40 MG IV SOLR
40.0000 mg | Freq: Two times a day (BID) | INTRAVENOUS | Status: DC
  Administered 2021-10-05 – 2021-10-12 (×16): 40 mg via INTRAVENOUS
  Filled 2021-10-05 (×15): qty 10

## 2021-10-05 NOTE — Progress Notes (Signed)
Patient arrived from ER in stable condition

## 2021-10-05 NOTE — ED Notes (Signed)
Pt unable to follow commands for stroke swallow screen

## 2021-10-05 NOTE — Progress Notes (Signed)
Patient returned from EEG in stable condition.

## 2021-10-05 NOTE — Progress Notes (Signed)
Pt pulled IV out. IV consult placed for new IV.

## 2021-10-05 NOTE — H&P (Signed)
History and Physical    Patient: Karen [name] DOB: 04/06/1976 DOA: 10/04/2021 DOS: the patient was seen and examined on 10/05/2021 PCP: Birdie Sons, MD  Patient coming from: Home  Chief Complaint:  Chief Complaint  Patient presents with   Altered Mental Status    HPI: Karen [name] is a 46 y.o. female with medical history significant of altered mental status, takes, psychosis, Ehlers-Danlos syndrome, fibromyalgia presenting by EMS for lying on the floor for last several days and not moving Per ED providers note.  Patient unable to provide history no family at bedside.  Patient has a history of psychotic episodes in the past patient has been noted to be urinating and defecating on herself per ED note.  The admission requested for urinary tract infection.  In the emergency room patient meets sepsis criteria with urinary tract infection heart rate of 101 respirations of 23 and elevated lactic acid.  Patient started on Rocephin as her allergy is GI intolerance.  In the emergency room patient given 2 L of normal saline which I will maintain 100 cc/h until tomorrow morning.   Review of Systems: unable to review all systems due to the inability of the patient to answer questions. Past Medical History:  Diagnosis Date   Ehlers-Danlos syndrome    Fibromyalgia    Headache    Sleep apnea    Tics of organic origin    Transient alteration of awareness    Past Surgical History:  Procedure Laterality Date   COLONOSCOPY WITH PROPOFOL N/A 02/13/2018   Procedure: COLONOSCOPY WITH PROPOFOL;  Surgeon: [name], MD;  Location: [facility] ENDOSCOPY;  Service: Endoscopy;  Laterality: N/A;   DILATION AND CURETTAGE OF UTERUS  2009   ESOPHAGOGASTRODUODENOSCOPY (EGD) WITH PROPOFOL  02/13/2018   Procedure: ESOPHAGOGASTRODUODENOSCOPY (EGD) WITH PROPOFOL;  Surgeon: [name], MD;  Location: [place] ENDOSCOPY;  Service: Endoscopy;;   EYE SURGERY Left 1990   3 Surgeries on left eye to  correct crossed eye   LAPAROSCOPY Left 2003   Fallopian Tube   [place]   3 Surgeries to repair broken arm   OTHER SURGICAL HISTORY Left    3 surgeries on her left thigh as an infant   OTHER SURGICAL HISTORY  1998   Jaw surgery   Right arm surgery  1987   x 3 due to fracture   TOENAIL EXCISION Bilateral 06/13/2019   Procedure: BILATERAL SECOND TOE PARTIAL NAIL ABLATION;  Surgeon: Erle Crocker, MD;  Location: [place];  Service: Orthopedics;  Laterality: Bilateral;  SURGERY REQUEST TIME 1 HOUR   TONSILLECTOMY  12/13/2002   Social History:  reports that she has never smoked. She has never used smokeless tobacco. She reports that she does not drink alcohol and does not use drugs.  Allergies  Allergen Reactions   Augmentin  [Amoxicillin-Pot Clavulanate] Diarrhea   Chocolate Flavor     Other reaction(s): Other (See Comments) Wheezing, acne   Demerol [Meperidine] Other (See Comments)    Slow to wake up when this drug is given.    Keflex [Cephalexin] Nausea And Vomiting   Morphine And Related Nausea And Vomiting   Tape Dermatitis    Must use paper tape only   Toradol [Ketorolac Tromethamine] Nausea And Vomiting   Amoxicillin     Other reaction(s): Unknown   Chocolate     GI distress   Nsaids     patient develops ulcers Other  reaction(s): Other (See Comments) patient develops ulcers   Strawberry Extract     GI distress    Family History  Problem Relation Age of Onset   Depression Mother    Stroke Mother    Fibromyalgia Mother    Osteoarthritis Mother    Asthma Brother    Depression Brother    Migraines Brother    Asperger's syndrome Brother    Other Brother        Ehlers-Danlos Syndrome   Cancer Maternal Aunt    Asperger's syndrome Maternal Aunt    Breast cancer Maternal Aunt    Heart disease Paternal Aunt    Heart disease Paternal Uncle    Cervical cancer Maternal Grandmother     Osteoporosis Maternal Grandmother        Died at 39   Osteoarthritis Maternal Grandmother    Colon cancer Maternal Grandfather    Asthma Maternal Grandfather    Asperger's syndrome Maternal Grandfather    Emphysema Maternal Grandfather        Died at 18   Prostate cancer Maternal Grandfather    Heart attack Paternal Grandfather        Died at 9   Asthma Paternal Grandfather    Tuberculosis Paternal Grandfather    Cancer Cousin    Asperger's syndrome Cousin    Asperger's syndrome Other    Heart Problems Paternal Grandmother        Died at 78   Ehlers-Danlos syndrome Father    Ehlers-Danlos syndrome Brother     Prior to Admission medications   Medication Sig Start Date End Date Taking? Authorizing Provider  albuterol (VENTOLIN HFA) 108 (90 Base) MCG/ACT inhaler Inhale 2 puffs into the lungs every 6 (six) hours as needed. Patient not taking: Reported on 10/01/2021 06/27/19   Deneise Lever, MD  azelastine (OPTIVAR) 0.05 % ophthalmic solution Apply to eye as directed. 1 drop in each eye prn Patient not taking: Reported on 10/01/2021 02/13/20   [provider]  busPIRone (BUSPAR) 10 MG tablet Take 1 tablet (10 mg total) by mouth 2 (two) times daily. Take along with 15mg  tablet twice a day Patient not taking: Reported on 10/01/2021 03/23/21   Birdie Sons, MD  busPIRone (BUSPAR) 15 MG tablet TAKE 1 TABLET BY MOUTH TWICE A DAY Patient not taking: Reported on 10/01/2021 09/21/21   Birdie Sons, MD  celecoxib (CELEBREX) 200 MG capsule TAKE 1 CAPSULE BY MOUTH TWICE A DAY Patient not taking: Reported on 10/01/2021 09/12/21   Birdie Sons, MD  cyclobenzaprine (FLEXERIL) 5 MG tablet Take 1 tablet (5 mg total) by mouth 3 (three) times daily as needed for muscle spasms. Do not take within 6 hours of taking tizandine Patient not taking: Reported on 10/01/2021 03/26/21   Birdie Sons, MD  diclofenac Sodium (VOLTAREN) 1 % GEL APPLY AS NEEDED 3 TIMES A DAY Patient not taking: Reported  on 10/01/2021 11/16/20   Birdie Sons, MD  DULoxetine (CYMBALTA) 60 MG capsule TAKE 2 CAPSULES BY MOUTH EVERY DAY Patient not taking: Reported on 10/01/2021 02/03/21   Birdie Sons, MD  fentaNYL (DURAGESIC) 100 MCG/HR Place 1 patch onto the skin every 3 (three) days. Patient not taking: Reported on 10/01/2021 09/08/21   Birdie Sons, MD  fexofenadine (ALLEGRA) 180 MG tablet Take 180 mg by mouth daily. Patient not taking: Reported on 10/01/2021    [provider]  fluticasone Asencion Islam) 50 MCG/ACT nasal spray  05/12/21   [provider]  fluticasone (FLOVENT HFA) 110 MCG/ACT inhaler Inhale 2 puffs into the lungs 2 (two) times daily. Patient not taking: Reported on 10/01/2021 04/25/21   [provider]  Fluticasone-Salmeterol (ADVAIR) 250-50 MCG/DOSE AEPB Inhale 1 puff into the lungs every 12 (twelve) hours. Rinse mouth after use Patient not taking: Reported on 10/01/2021 04/02/20   Baird Lyons D, MD  gabapentin (NEURONTIN) 600 MG tablet TAKE 2 TABLETS IN THE MORNING AND 3 TABLETS AT BEDTIME Patient not taking: Reported on 10/01/2021 07/19/21   Rockwell Germany, NP  haloperidol (HALDOL) 0.5 MG tablet Take 0.5 tablets (0.25 mg total) by mouth 2 (two) times daily. Patient not taking: Reported on 10/01/2021 07/19/21   Rockwell Germany, NP  haloperidol (HALDOL) 1 MG tablet Take 1 mg by mouth 2 (two) times daily. Patient states she should be and has been on 5 mg twice daily. Patient not taking: Reported on 10/01/2021 04/15/21   [provider]  naloxone [facility]) nasal spray 4 mg/0.1 mL Place 1 spray into the nose as needed. Patient not taking: Reported on 10/01/2021 04/15/21   [provider]  omeprazole (PRILOSEC) 40 MG capsule Take 1 capsule (40 mg total) by mouth 2 (two) times daily. Patient not taking: Reported on 10/01/2021 05/25/21   [name], MD  ondansetron (ZOFRAN-ODT) 4 MG disintegrating tablet DISSOLVE 1 TABLET IN MOUTH EVERY 8 HOURS FOR NAUSEA AND  VOMITING Patient not taking: Reported on 10/01/2021 09/29/20   Birdie Sons, MD  pregabalin (LYRICA) 300 MG capsule Take 1 capsule (300 mg total) by mouth 2 (two) times daily. Patient not taking: Reported on 10/01/2021 08/16/21   Birdie Sons, MD  QUEtiapine (SEROQUEL) 200 MG tablet TAKE 1 TABLET BY MOUTH AT BEDTIME. Patient not taking: Reported on 10/01/2021 12/30/20   Birdie Sons, MD  Sod Picosulfate-Mag Ox-Cit [facility]) 10-3.5-12 MG-GM -GM/160ML SOLN Take 320 mLs by mouth as directed. Patient not taking: Reported on 10/01/2021 05/25/21   [name], MD  tamsulosin (FLOMAX) 0.4 MG CAPS capsule Take 1 capsule (0.4 mg total) by mouth daily. Patient not taking: Reported on 10/01/2021 02/22/21   Bjorn Loser, MD  tiZANidine (ZANAFLEX) 4 MG tablet TAKE 0.5-1 TABLETS (2-4 MG TOTAL) BY MOUTH EVERY 8 (EIGHT) HOURS AS NEEDED FOR MUSCLE SPASMS. Patient not taking: Reported on 10/01/2021 04/14/21   Birdie Sons, MD    Physical Exam: Vitals:   10/04/21 2000 10/04/21 2100 10/04/21 2130 10/04/21 2231  BP: 113/77 (!) 125/92 (!) 119/92 125/89  Pulse: 86 100 95 91  Resp: (!) 22 19 (!) 22 (!) 23  Temp:      TempSrc:      SpO2: 99% 99% 99% 99%  Weight:      Height:      Physical Exam Vitals reviewed.  Constitutional:      General: She is not in acute distress. HENT:     Head: Normocephalic and atraumatic.     Right Ear: External ear normal.     Left Ear: External ear normal.  Eyes:     Pupils: Pupils are equal, round, and reactive to light.  Cardiovascular:     Rate and Rhythm: Normal rate and regular rhythm.     Pulses: Normal pulses.     Heart sounds: Normal heart sounds.  Pulmonary:     Effort: Pulmonary effort is normal.     Breath sounds: Normal breath sounds.  Abdominal:     General: Bowel sounds are normal. There is no distension.  Palpations: Abdomen is soft. There is no mass.     Tenderness: There is no abdominal tenderness. There is no guarding.     Hernia:  No hernia is present.  Musculoskeletal:     Right lower leg: No edema.     Left lower leg: No edema.  Skin:    General: Skin is dry.  Neurological:     Mental Status: She is alert.     Cranial Nerves: No cranial nerve deficit or facial asymmetry.     Comments: At baseline she uses wheelchair.   Psychiatric:        Attention and Perception: She is inattentive.        Mood and Affect: Affect is blunt and flat.        Speech: She is noncommunicative.        Behavior: Behavior is withdrawn.     Data Reviewed: > CMP shows potassium of 3.3 glucose 141 otherwise normal electrolytes and kidney function and LFTs. > CPK of 200. > Troponin of 167 repeat of 193. > Lactic acid of 2.4 with a repeat of 2.1. > CBC shows white count of 12.5 hemoglobin of 16.2 platelets of 330. > Respiratory panel negative for flu and COVID. > Urinalysis shows nitrite positive cloudy urine with many bacteria and few WBCs. > CT angio chest today negative for pulmonary embolism, shows a 1.7 cm hypodense nodule in the right lobe of the thyroid with recommendations of ultrasound.  Assessment and Plan: Altered mental status/seizure disorder: -We will admit to med telemetry unit with continuous cardiac monitoring. -Neurochecks and seizure precautions aspiration and fall precautions. -Differentials include metabolic encephalopathy, seizures, schizophrenia. -We will obtain EEG, psychiatry consult placed message sent. -Correct underlying disorders including urinary tract infection and evaluating the patient's thyroid nodule. -Less likely however underlying cardiac etiology/ACS.   Sepsis secondary to UTI/UTI: -Patient started on Rocephin urine cultures and blood cultures obtained. -We will follow lactic acid levels.   Elevated troponin: -Differentials include NSTEMI versus demand ischemia versus most likely sepsis related.  Follow and evaluate as needed cardiology consult as deemed appropriate.   Seizure  disorder: -Seizure precautions and neurochecks. -Patient is not taking any of her meds per chart review. -EEG. -We will restart previous AEDs once med rec is complete.  Depression and anxiety: -Await psychiatry eval and restart antianxiety antidepression meds.  Thyroid nodule: -We will obtain a thyroid ultrasound and a free T4 and TSH.   Hypokalemia: -Replaced IV we will check and follow levels.   Advance Care Planning:   Code Status: Full Code  Consults:  psychiatry consult.   Family Communication:  Karen [name], Karen [name] (Mother)  [phone] (Home Phone)  Severity of Illness: The appropriate patient status for this patient is INPATIENT. Inpatient status is judged to be reasonable and necessary in order to provide the required intensity of service to ensure the patient's safety. The patient's presenting symptoms, physical exam findings, and initial radiographic and laboratory data in the context of their chronic comorbidities is felt to place them at high risk for further clinical deterioration. Furthermore, it is not anticipated that the patient will be medically stable for discharge from the hospital within 2 midnights of admission.   * I certify that at the point of admission it is my clinical judgment that the patient will require inpatient hospital care spanning beyond 2 midnights from the point of admission due to high intensity of service, high risk for further deterioration and high frequency of surveillance required.*  Author: Esmeralda Links  Sherrye Payor, MD 10/05/2021 12:17 AM  For on call review [email or web address].

## 2021-10-05 NOTE — Progress Notes (Signed)
Spoke with patient's mother [name] via phone to inquire as to what sort of things patient likes.   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 She loves orchestra music. 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   Patient does watch tv, although currently she avoids horror, anything with gore or action movies right now due to it making her scared and/or paranoid. Mother states patient loves documentaries.   Patient had recently only been eating spoon-fed vanilla boost. Prior to recent weeks, she was eating a soft diet. Mother states she has not really "chewed" anything in quite some time. Mother does state when she is eating "normally" she avoids fried foods.   Patient has a Surveyor, mining at home.

## 2021-10-05 NOTE — ED Notes (Signed)
Lab called to add on blood cultures from collection sent down

## 2021-10-05 NOTE — ED Notes (Signed)
Echo at bedside

## 2021-10-05 NOTE — Progress Notes (Signed)
Pt alert and asking for food. Pt observed eating applesauce with no issues. Ordered soft diet per nursing order.

## 2021-10-05 NOTE — ED Notes (Signed)
Informed RN bed assigned 

## 2021-10-05 NOTE — Consult Note (Signed)
[facility] Consult   Reason for Consult: Consult for this 46 year old woman brought into the hospital at the instigation of her mother because of worsening mental status and inability to care for herself at home and not eating or drinking Referring Physician: Posey Pronto Patient Identification: Karen [name] MRN:  [id number] Principal Diagnosis: Catatonia Diagnosis:  Principal Problem:   Catatonia Active Problems:   Depression with anxiety   Seizure disorder ([place])   Sepsis secondary to UTI ([place])   AMS (altered mental status)   Thyroid nodule   Hypokalemia   Elevated troponin   Depression, major, recurrent, severe with psychosis ([place])   Total Time spent with patient: 1 hour  Subjective:   Karen [name] is a 46 y.o. female patient admitted with patient herself not able to give any information.  HPI: Patient seen and chart reviewed.  46 year old woman brought to the hospital at the initiation of her mother.  Mother reports that for the past 2 weeks patient has become almost completely unresponsive and most recently was not responding at all.  Not eating or drinking normally and probably not taking anything by mouth for several days.  Patient was not able to care for herself and was falling off of the bed mother not able to care for.  I came to see the patient in her room.  She was not responding to any verbal stimuli.  Spoke her name clearly and loudly multiple times.  Shook her on the shoulder.  Rubbed lightly on the chest.  Patient has some slight flickers of her eyelids but no other movement or attempt to respond.  Mother reports that the patient was in her usual state of health until this past summer.  Her usual state of health was impaired.  Patient was in a wheelchair.  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  Nevertheless mother reports that mentally the patient had good  quality of life and was alert and oriented and interactive until this past summer.  Since then the patient gradually developed psychotic symptoms.  Started becoming more and more delusional.  Mother gives examples of the patient saying that she was not real, medicine was not real, she had worms inside her brain etc.  Patient was treated with medication for depression but was noncompliant and mother was not able to force the patient to take it.  We saw the patient a couple times back in September but she did not get admitted to the psychiatric ward.  Most recently then has not been on any medicine.  Current labs her head CT most recently does not show any new anatomical change.  On admission here the patient has very high troponins elevated lactic acid and appears to be septic and is being treated for it.  Past Psychiatric History: Patient had not had any history of psychiatric problems that had been identified or treated prior to this past year.  The chart indicates that at one point the patient having a diagnosis of schizophrenia.  Mother absolutely denies that.  Patient graduated from college and had good quality of life and functioning up until last year.  No history of suicide attempts.  Risk to Self:   Risk to Others:   Prior Inpatient Therapy:   Prior Outpatient Therapy:    Past Medical History:  Past Medical History:  Diagnosis Date   Ehlers-Danlos syndrome    Fibromyalgia  Headache    Sleep apnea    Tics of organic origin    Transient alteration of awareness     Past Surgical History:  Procedure Laterality Date   COLONOSCOPY WITH PROPOFOL N/A 02/13/2018   Procedure: COLONOSCOPY WITH PROPOFOL;  Surgeon: [name], MD;  Location: [facility] ENDOSCOPY;  Service: Endoscopy;  Laterality: N/A;   DILATION AND CURETTAGE OF UTERUS  2009   ESOPHAGOGASTRODUODENOSCOPY (EGD) WITH PROPOFOL  02/13/2018   Procedure: ESOPHAGOGASTRODUODENOSCOPY (EGD) WITH PROPOFOL;  Surgeon: [name], MD;  Location:  [place] ENDOSCOPY;  Service: Endoscopy;;   EYE SURGERY Left 1990   3 Surgeries on left eye to correct crossed eye   LAPAROSCOPY Left 2003   Fallopian Tube   [place]   3 Surgeries to repair broken arm   OTHER SURGICAL HISTORY Left    3 surgeries on her left thigh as an infant   OTHER SURGICAL HISTORY  1998   Jaw surgery   Right arm surgery  1987   x 3 due to fracture   TOENAIL EXCISION Bilateral 06/13/2019   Procedure: BILATERAL SECOND TOE PARTIAL NAIL ABLATION;  Surgeon: Erle Crocker, MD;  Location: [place];  Service: Orthopedics;  Laterality: Bilateral;  SURGERY REQUEST TIME 1 HOUR   TONSILLECTOMY  12/13/2002   Family History:  Family History  Problem Relation Age of Onset   Depression Mother    Stroke Mother    Fibromyalgia Mother    Osteoarthritis Mother    Asthma Brother    Depression Brother    Migraines Brother    Asperger's syndrome Brother    Other Brother        Ehlers-Danlos Syndrome   Cancer Maternal Aunt    Asperger's syndrome Maternal Aunt    Breast cancer Maternal Aunt    Heart disease Paternal Aunt    Heart disease Paternal Uncle    Cervical cancer Maternal Grandmother    Osteoporosis Maternal Grandmother        Died at 50   Osteoarthritis Maternal Grandmother    Colon cancer Maternal Grandfather    Asthma Maternal Grandfather    Asperger's syndrome Maternal Grandfather    Emphysema Maternal Grandfather        Died at 73   Prostate cancer Maternal Grandfather    Heart attack Paternal Grandfather        Died at 73   Asthma Paternal Grandfather    Tuberculosis Paternal Grandfather    Cancer Cousin    Asperger's syndrome Cousin    Asperger's syndrome Other    Heart Problems Paternal Grandmother        Died at 18   Ehlers-Danlos syndrome Father    Ehlers-Danlos syndrome Brother    Family Psychiatric  History: Other than the Ehlers-Danlos syndrome nothing else clear. Social  History:  Social History   Substance and Sexual Activity  Alcohol Use No   Alcohol/week: 0.0 standard drinks     Social History   Substance and Sexual Activity  Drug Use No    Social History   Socioeconomic History   Marital status: Single    Spouse name: Not on file   Number of children: Not on file   Years of education: Not on file   Highest education level: Not on file  Occupational History   Not on file  Tobacco Use   Smoking status: Never   Smokeless tobacco: Never  Vaping Use   Vaping Use:  Never used  Substance and Sexual Activity   Alcohol use: No    Alcohol/week: 0.0 standard drinks   Drug use: No   Sexual activity: Never  Other Topics Concern   Not on file  Social History Narrative   Frida is 72.   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    She lives with her mother.    She enjoys reading, watching TV, writing, and painting.    Social Determinants of Health   Financial Resource Strain: Not on file  Food Insecurity: Not on file  Transportation Needs: Not on file  Physical Activity: Not on file  Stress: Not on file  Social Connections: Not on file   Additional Social History:    Allergies:   Allergies  Allergen Reactions   Augmentin  [Amoxicillin-Pot Clavulanate] Diarrhea   Chocolate Flavor     Other reaction(s): Other (See Comments) Wheezing, acne   Demerol [Meperidine] Other (See Comments)    Slow to wake up when this drug is given.    Keflex [Cephalexin] Nausea And Vomiting   Morphine And Related Nausea And Vomiting   Tape Dermatitis    Must use paper tape only   Toradol [Ketorolac Tromethamine] Nausea And Vomiting   Amoxicillin     Other reaction(s): Unknown   Chocolate     GI distress   Nsaids     patient develops ulcers Other reaction(s): Other (See Comments) patient develops ulcers   Strawberry Extract     GI distress    Labs:  Results for orders placed or performed during the hospital encounter of 10/04/21 (from the past 48  hour(s))  Acetaminophen level     Status: Abnormal   Collection Time: 10/04/21  7:01 PM  Result Value Ref Range   Acetaminophen (Tylenol), Serum <10 (L) 10 - 30 ug/mL    Comment: (NOTE) Therapeutic concentrations vary significantly. A range of 10-30 ug/mL  may be an effective concentration for many patients. However, some  are best treated at concentrations outside of this range. Acetaminophen concentrations >150 ug/mL at 4 hours after ingestion  and >50 ug/mL at 12 hours after ingestion are often associated with  toxic reactions.  Performed at [facility], [address] [place], [place] [address]   Basic metabolic panel     Status: Abnormal   Collection Time: 10/04/21  7:01 PM  Result Value Ref Range   Sodium 145 135 - 145 mmol/L   Potassium 3.3 (L) 3.5 - 5.1 mmol/L   Chloride 107 98 - 111 mmol/L   CO2 28 22 - 32 mmol/L   Glucose, Bld 141 (H) 70 - 99 mg/dL    Comment: Glucose reference range applies only to samples taken after fasting for at least 8 hours.   BUN 32 (H) 6 - 20 mg/dL   Creatinine, Ser 0.83 0.44 - 1.00 mg/dL   Calcium 8.6 (L) 8.9 - 10.3 mg/dL   GFR, Estimated >60 >60 mL/min    Comment: (NOTE) Calculated using the CKD-EPI Creatinine Equation (2021)    Anion gap 10 5 - 15    Comment: Performed at [facility], [address] [place], [place] [address]  Hepatic function panel     Status: None   Collection Time: 10/04/21  7:01 PM  Result Value Ref Range   Total Protein 6.9 6.5 - 8.1 g/dL   Albumin 3.6 3.5 - 5.0 g/dL   AST 41 15 - 41 U/L   ALT 30 0 - 44 U/L  Alkaline Phosphatase 94 38 - 126 U/L   Total Bilirubin 0.6 0.3 - 1.2 mg/dL   Bilirubin, Direct 0.1 0.0 - 0.2 mg/dL   Indirect Bilirubin 0.5 0.3 - 0.9 mg/dL    Comment: Performed at [facility], [address] [place], [place] [address]  Ethanol     Status: None   Collection Time: 10/04/21  7:01 PM  Result Value Ref Range   Alcohol, Ethyl (B) <10 <10 mg/dL    Comment:  (NOTE) Lowest detectable limit for serum alcohol is 10 mg/dL.  For medical purposes only. Performed at [facility], [address] [place], [place] [address]   Lactic acid, plasma     Status: Abnormal   Collection Time: 10/04/21  7:01 PM  Result Value Ref Range   Lactic Acid, Venous 2.4 (HH) 0.5 - 1.9 mmol/L    Comment: CRITICAL RESULT CALLED TO, READ BACK BY AND VERIFIED WITH OLIVIA PRILLAMAN @1941  ON 10/04/21 SKL Performed at [facility], [address] [place], [place] [address]   Salicylate level     Status: Abnormal   Collection Time: 10/04/21  7:01 PM  Result Value Ref Range   Salicylate Lvl <6.0 (L) 7.0 - 30.0 mg/dL    Comment: Performed at [facility], [address], [place] [address]  Troponin I (High Sensitivity)     Status: Abnormal   Collection Time: 10/04/21  7:01 PM  Result Value Ref Range   Troponin I (High Sensitivity) 167 (HH) <18 ng/L    Comment: CRITICAL RESULT CALLED TO, READ BACK BY AND VERIFIED WITH OLIVIA PRILLAMAN @1941  ON 10/04/21 SKL (NOTE) Elevated high sensitivity troponin I (hsTnI) values and significant  changes across serial measurements may suggest ACS but many other  chronic and acute conditions are known to elevate hsTnI results.  Refer to the "Links" section for chest pain algorithms and additional  guidance. Performed at [facility], [address] [place], [place] [address]   CBC with Differential     Status: Abnormal   Collection Time: 10/04/21  7:01 PM  Result Value Ref Range   WBC 12.5 (H) 4.0 - 10.5 K/uL   RBC 5.93 (H) 3.87 - 5.11 MIL/uL   Hemoglobin 16.2 (H) 12.0 - 15.0 g/dL   HCT 49.6 (H) 36.0 - 46.0 %   MCV 83.6 80.0 - 100.0 fL   MCH 27.3 26.0 - 34.0 pg   MCHC 32.7 30.0 - 36.0 g/dL   RDW 15.7 (H) 11.5 - 15.5 %   Platelets 378 150 - 400 K/uL   nRBC 0.0 0.0 - 0.2 %   Neutrophils Relative % 79 %   Neutro Abs 9.9 (H) 1.7 - 7.7 K/uL   Lymphocytes Relative 15 %   Lymphs Abs  1.8 0.7 - 4.0 K/uL   Monocytes Relative 5 %   Monocytes Absolute 0.6 0.1 - 1.0 K/uL   Eosinophils Relative 1 %   Eosinophils Absolute 0.1 0.0 - 0.5 K/uL   Basophils Relative 0 %   Basophils Absolute 0.0 0.0 - 0.1 K/uL   Immature Granulocytes 0 %   Abs Immature Granulocytes 0.05 0.00 - 0.07 K/uL    Comment: Performed at [facility], [address] [place], [place] [address]  CK     Status: None   Collection Time: 10/04/21  7:01 PM  Result Value Ref Range   Total CK 200 38 - 234 U/L    Comment: Performed at [facility], [address] [place], [place] [address]  CULTURE, BLOOD (ROUTINE X 2) w Reflex to ID Panel     Status: None (Preliminary result)   Collection Time: 10/04/21  7:01 PM   Specimen: BLOOD  Result Value Ref Range   Specimen Description BLOOD RIGHT ASSIST CONTROL    Special Requests      BOTTLES DRAWN AEROBIC AND ANAEROBIC Blood Culture adequate volume   Culture      NO GROWTH < 12 HOURS Performed at [facility], [address], [place], [place] [address]    Report Status PENDING   Resp Panel by RT-PCR (Flu A&B, Covid) Nasopharyngeal Swab     Status: None   Collection Time: 10/04/21  7:59 PM   Specimen: Nasopharyngeal Swab; Nasopharyngeal(NP) swabs in vial transport medium  Result Value Ref Range   SARS Coronavirus 2 by RT PCR NEGATIVE NEGATIVE    Comment: (NOTE) SARS-CoV-2 target nucleic acids are NOT DETECTED.  The SARS-CoV-2 RNA is generally detectable in upper respiratory specimens during the acute phase of infection. The lowest concentration of SARS-CoV-2 viral copies this assay can detect is 138 copies/mL. A negative result does not preclude SARS-Cov-2 infection and should not be used as the sole basis for treatment or other patient management decisions. A negative result may occur with  improper specimen collection/handling, submission of specimen other than nasopharyngeal swab, presence of viral mutation(s) within  the areas targeted by this assay, and inadequate number of viral copies(<138 copies/mL). A negative result must be combined with clinical observations, patient history, and epidemiological information. The expected result is Negative.  Fact Sheet for Patients:  EntrepreneurPulse.com.au  Fact Sheet for Healthcare Providers:  IncredibleEmployment.be  This test is no t yet approved or cleared by the [place] FDA and  has been authorized for detection and/or diagnosis of SARS-CoV-2 by FDA under an Emergency Use Authorization (EUA). This EUA will remain  in effect (meaning this test can be used) for the duration of the COVID-19 declaration under Section 564(b)(1) of the Act, 21 U.S.C.section 360bbb-3(b)(1), unless the authorization is terminated  or revoked sooner.       Influenza A by PCR NEGATIVE NEGATIVE   Influenza B by PCR NEGATIVE NEGATIVE    Comment: (NOTE) The Xpert Xpress SARS-CoV-2/FLU/RSV plus assay is intended as an aid in the diagnosis of influenza from Nasopharyngeal swab specimens and should not be used as a sole basis for treatment. Nasal washings and aspirates are unacceptable for Xpert Xpress SARS-CoV-2/FLU/RSV testing.  Fact Sheet for Patients: EntrepreneurPulse.com.au  Fact Sheet for Healthcare Providers: IncredibleEmployment.be  This test is not yet approved or cleared by the [place] FDA and has been authorized for detection and/or diagnosis of SARS-CoV-2 by FDA under an Emergency Use Authorization (EUA). This EUA will remain in effect (meaning this test can be used) for the duration of the COVID-19 declaration under Section 564(b)(1) of the Act, 21 U.S.C. section 360bbb-3(b)(1), unless the authorization is terminated or revoked.  Performed at [facility], [address] [place], [place] [address]   Lactic acid, plasma     Status: Abnormal   Collection Time:  10/04/21  9:24 PM  Result Value Ref Range   Lactic Acid, Venous 2.1 (HH) 0.5 - 1.9 mmol/L    Comment: CRITICAL VALUE NOTED. VALUE IS CONSISTENT WITH PREVIOUSLY REPORTED/CALLED VALUE SKL Performed at [facility], [address] [place], [place] [address]   Troponin I (High Sensitivity)     Status: Abnormal   Collection Time: 10/04/21  9:24 PM  Result Value Ref Range   Troponin I (High Sensitivity) 193 (HH) <18 ng/L    Comment: CRITICAL VALUE NOTED. VALUE IS CONSISTENT WITH PREVIOUSLY REPORTED/CALLED VALUE SS (NOTE) Elevated high sensitivity troponin I (hsTnI) values and significant  changes across serial measurements may suggest ACS but many other  chronic and acute conditions are known to elevate hsTnI results.  Refer to the "Links" section for chest pain algorithms and additional  guidance. Performed at [facility], [address] [place], [place] [address]   Urinalysis, Routine w reflex microscopic Urine, In & Out Cath     Status: Abnormal   Collection Time: 10/04/21  9:43 PM  Result Value Ref Range   Color, Urine AMBER (A) YELLOW    Comment: BIOCHEMICALS MAY BE AFFECTED BY COLOR   APPearance CLOUDY (A) CLEAR   Specific Gravity, Urine 1.024 1.005 - 1.030   pH 5.0 5.0 - 8.0   Glucose, UA NEGATIVE NEGATIVE mg/dL   Hgb urine dipstick NEGATIVE NEGATIVE   Bilirubin Urine NEGATIVE NEGATIVE   Ketones, ur NEGATIVE NEGATIVE mg/dL   Protein, ur NEGATIVE NEGATIVE mg/dL   Nitrite POSITIVE (A) NEGATIVE   Leukocytes,Ua NEGATIVE NEGATIVE   RBC / HPF 0-5 0 - 5 RBC/hpf   WBC, UA 6-10 0 - 5 WBC/hpf   Bacteria, UA MANY (A) NONE SEEN   Squamous Epithelial / LPF NONE SEEN 0 - 5   Mucus PRESENT     Comment: Performed at [facility], [address], [place], [place] [address]  Urine Drug Screen, Qualitative     Status: None   Collection Time: 10/04/21  9:43 PM  Result Value Ref Range   Tricyclic, Ur Screen NONE DETECTED NONE DETECTED   Amphetamines, Ur  Screen NONE DETECTED NONE DETECTED   MDMA (Ecstasy)Ur Screen NONE DETECTED NONE DETECTED   Cocaine Metabolite,Ur [place] NONE DETECTED NONE DETECTED   Opiate, Ur Screen NONE DETECTED NONE DETECTED   Phencyclidine (PCP) Ur S NONE DETECTED NONE DETECTED   Cannabinoid 50 Ng, Ur [place] NONE DETECTED NONE DETECTED   Barbiturates, Ur Screen NONE DETECTED NONE DETECTED   Benzodiazepine, Ur Scrn NONE DETECTED NONE DETECTED   Methadone Scn, Ur NONE DETECTED NONE DETECTED    Comment: (NOTE) Tricyclics + metabolites, urine    Cutoff 1000 ng/mL Amphetamines + metabolites, urine  Cutoff 1000 ng/mL MDMA (Ecstasy), urine              Cutoff 500 ng/mL Cocaine Metabolite, urine          Cutoff 300 ng/mL Opiate + metabolites, urine        Cutoff 300 ng/mL Phencyclidine (PCP), urine         Cutoff 25 ng/mL Cannabinoid, urine                 Cutoff 50 ng/mL Barbiturates + metabolites, urine  Cutoff 200 ng/mL Benzodiazepine, urine              Cutoff 200 ng/mL Methadone, urine                   Cutoff 300 ng/mL  The urine drug screen provides only a preliminary, unconfirmed analytical test result and should not be used for non-medical purposes. Clinical consideration and professional judgment should be applied to any positive drug screen result due to possible interfering substances. A more specific alternate chemical method must be used in order to obtain a confirmed analytical result. Gas chromatography / mass spectrometry (GC/MS)  is the preferred confirm atory method. Performed at [facility], [address] [place], [place] [address]   Procalcitonin - Baseline     Status: None   Collection Time: 10/05/21 12:30 AM  Result Value Ref Range   Procalcitonin <0.10 ng/mL    Comment:        Interpretation: PCT (Procalcitonin) <= 0.5 ng/mL: Systemic infection (sepsis) is not likely. Local bacterial infection is possible. (NOTE)       Sepsis PCT Algorithm           Lower Respiratory Tract                                       Infection PCT Algorithm    ----------------------------     ----------------------------         PCT < 0.25 ng/mL                PCT < 0.10 ng/mL          Strongly encourage             Strongly discourage   discontinuation of antibiotics    initiation of antibiotics    ----------------------------     -----------------------------       PCT 0.25 - 0.50 ng/mL            PCT 0.10 - 0.25 ng/mL               OR       >80% decrease in PCT            Discourage initiation of                                            antibiotics      Encourage discontinuation           of antibiotics    ----------------------------     -----------------------------         PCT >= 0.50 ng/mL              PCT 0.26 - 0.50 ng/mL               AND        <80% decrease in PCT             Encourage initiation of                                             antibiotics       Encourage continuation           of antibiotics    ----------------------------     -----------------------------        PCT >= 0.50 ng/mL                  PCT > 0.50 ng/mL               AND         increase in PCT                  Strongly encourage  initiation of antibiotics    Strongly encourage escalation           of antibiotics                                     -----------------------------                                           PCT <= 0.25 ng/mL                                                 OR                                        > 80% decrease in PCT                                      Discontinue / Do not initiate                                             antibiotics  Performed at [facility], [address] [place], [place] [address]   TSH     Status: None   Collection Time: 10/05/21 12:30 AM  Result Value Ref Range   TSH 1.057 0.350 - 4.500 uIU/mL    Comment: Performed by a 3rd Generation assay with a functional sensitivity of <=0.01  uIU/mL. Performed at [facility], [address] [place], [place] [address]   T4, free     Status: Abnormal   Collection Time: 10/05/21 12:30 AM  Result Value Ref Range   Free T4 1.19 (H) 0.61 - 1.12 ng/dL    Comment: (NOTE) Biotin ingestion may interfere with free T4 tests. If the results are inconsistent with the TSH level, previous test results, or the clinical presentation, then consider biotin interference. If needed, order repeat testing after stopping biotin. Performed at [facility], [address] [place], [place] [address]   Troponin I (High Sensitivity)     Status: Abnormal   Collection Time: 10/05/21 12:30 AM  Result Value Ref Range   Troponin I (High Sensitivity) 217 (HH) <18 ng/L    Comment: CRITICAL VALUE NOTED. VALUE IS CONSISTENT WITH PREVIOUSLY REPORTED/CALLED VALUE SKL (NOTE) Elevated high sensitivity troponin I (hsTnI) values and significant  changes across serial measurements may suggest ACS but many other  chronic and acute conditions are known to elevate hsTnI results.  Refer to the "Links" section for chest pain algorithms and additional  guidance. Performed at [facility], [address] [place], [place] [address]   Magnesium     Status: None   Collection Time: 10/05/21 12:30 AM  Result Value Ref Range   Magnesium 2.2 1.7 - 2.4 mg/dL    Comment: Performed at [facility], [address] [place], [place] [address]  Phosphorus     Status:  None   Collection Time: 10/05/21 12:30 AM  Result Value Ref Range   Phosphorus 2.6 2.5 - 4.6 mg/dL    Comment: Performed at [facility], [address] [place], [place] [address]  Troponin I (High Sensitivity)     Status: Abnormal   Collection Time: 10/05/21  2:52 AM  Result Value Ref Range   Troponin I (High Sensitivity) 160 (HH) <18 ng/L    Comment: CRITICAL VALUE NOTED. VALUE IS CONSISTENT WITH PREVIOUSLY REPORTED/CALLED VALUE SKL (NOTE) Elevated high  sensitivity troponin I (hsTnI) values and significant  changes across serial measurements may suggest ACS but many other  chronic and acute conditions are known to elevate hsTnI results.  Refer to the "Links" section for chest pain algorithms and additional  guidance. Performed at [facility], [address] [place], [place] [address]   Protime-INR     Status: None   Collection Time: 10/05/21  6:13 AM  Result Value Ref Range   Prothrombin Time 13.8 11.4 - 15.2 seconds   INR 1.1 0.8 - 1.2    Comment: (NOTE) INR goal varies based on device and disease states. Performed at [facility], [address] [place], [place] [address]   Cortisol-am, blood     Status: None   Collection Time: 10/05/21  6:13 AM  Result Value Ref Range   Cortisol - AM 15.7 6.7 - 22.6 ug/dL    Comment: Performed at [facility] [address] [place], [place] [address]    Current Facility-Administered Medications  Medication Dose Route Frequency Provider Last Rate Last Admin   0.9 % NaCl with KCl 20 mEq/ L  infusion   Intravenous Continuous Fritzi Mandes, MD 75 mL/hr at 10/05/21 1700 New Bag at 10/05/21 1700   acetaminophen (TYLENOL) tablet 650 mg  650 mg Oral Q6H PRN Para [place], MD       Or   acetaminophen (TYLENOL) suppository 650 mg  650 mg Rectal Q6H PRN Para Skeans, MD       albuterol (PROVENTIL) (2.5 MG/3ML) 0.083% nebulizer solution 2.5 mg  2.5 mg Nebulization Q6H PRN Renda Rolls, RPH       cefTRIAXone (ROCEPHIN) 1 g in sodium chloride 0.9 % 100 mL IVPB  1 g Intravenous Q24H Para Skeans, MD   Stopped at 10/05/21 0112   heparin injection 5,000 Units  5,000 Units Subcutaneous Q8H Florina Ou V, MD   5,000 Units at 10/05/21 1401   pantoprazole (PROTONIX) injection 40 mg  40 mg Intravenous Q12H Florina Ou V, MD   40 mg at 10/05/21 2505   sodium chloride flush (NS) 0.9 % injection 3 mL  3 mL Intravenous Q12H Para Skeans, MD   3 mL at 10/05/21 3976     Musculoskeletal: Strength & Muscle Tone: decreased Gait & Station: unable to stand Patient leans: N/A            Psychiatric Specialty Exam:  Presentation  General Appearance: Disheveled  Eye Contact:Fair  Speech:Normal Rate; Clear and Coherent  Speech Volume:Normal  Handedness:Right   Mood and Affect  Mood:Anxious  Affect:Congruent   Thought Process  Thought Processes:Disorganized  Descriptions of Associations:Tangential  Orientation:Full (Time, Place and Person)  Thought Content:Tangential  History of Schizophrenia/Schizoaffective disorder:No  Duration of Psychotic Symptoms:Greater than six months (Since August 2022)  Hallucinations:No data recorded Ideas of Reference:Paranoia  Suicidal Thoughts:No data recorded Homicidal Thoughts:No data recorded  Sensorium  Memory:Immediate Fair  Judgment:Impaired  Insight:Lacking  Executive Functions  Concentration:Fair  Attention Span:Fair  [place]  Language:Good   Psychomotor Activity  Psychomotor Activity:No data recorded  Assets  Assets:Communication Skills; Leisure Time; Social Support; Resilience   Sleep  Sleep:No data recorded  Physical Exam: Physical Exam Vitals and nursing note reviewed.  Constitutional:      Appearance: Normal appearance.  HENT:     Head: Normocephalic and atraumatic.     Mouth/Throat:     Pharynx: Oropharynx is clear.  Eyes:     Pupils: Pupils are equal, round, and reactive to light.  Cardiovascular:     Rate and Rhythm: Normal rate and regular rhythm.  Pulmonary:     Effort: Pulmonary effort is normal.     Breath sounds: Normal breath sounds.  Abdominal:     General: Abdomen is flat.     Palpations: Abdomen is soft.  Musculoskeletal:        General: Normal range of motion.  Skin:    General: Skin is warm and dry.  Psychiatric:        Attention and Perception: She is inattentive.        Speech: She is  noncommunicative.        Cognition and Memory: Cognition is impaired.   Review of Systems  Unable to perform ROS: Patient unresponsive  Blood pressure 120/83, pulse 69, temperature 98.2 F (36.8 C), temperature source Oral, resp. rate 17, height 5\' 3"  (1.6 m), weight 60.8 kg, SpO2 100 %. Body mass index is 23.74 kg/m.  Treatment Plan Summary: Medication management and Plan 46 year old woman who sounds like she developed psychotic symptoms in midlife accompanied by symptoms consistent with depression.  Most likely diagnosis would be major depression severe with psychotic symptoms.  This was gradually progressing over months as the patient was noncompliant with treatment and resistant to receiving psychiatric treatment.  Most recently it sounds like she was becoming catatonic.  Current condition probably a combination of catatonia and sepsis.  I spoke with the mother today and reviewed with her the treatments for catatonia including benzodiazepines and electroconvulsive therapy.  I will follow up with the hospitalist treatment team about her medical care.  Patient may need further treatment of infection and hydration and stabilization but if her mental status continues impaired we will probably recommend ECT.  For now I am going to order some modest but standing doses of benzodiazepines to see if that may help with her mental status.  Disposition:  See note above  Alethia Berthold, MD 10/05/2021 6:01 PM

## 2021-10-05 NOTE — Progress Notes (Signed)
Eeg done 

## 2021-10-05 NOTE — Progress Notes (Signed)
[facility] [place] NAME: Karen [name]    MR#:  [id number]  DATE OF BIRTH:  [birth year]  SUBJECTIVE:   patient was brought in by EMS with her being unresponsive, not eating, not communicating, remaining stiff and defecating and urinating on herself according to mother.  No family at bedside. Patient eyes open. No communication. Getting IV fluids.   VITALS:  Blood pressure 120/83, pulse 69, temperature 98.2 F (36.8 C), temperature source Oral, resp. rate 17, height 5\' 3"  (1.6 m), weight 60.8 kg, SpO2 100 %.  PHYSICAL EXAMINATION:   GENERAL:  46 y.o.-year-old patient lying in the bed with no acute distress. Poor hygiene LUNGS: Normal breath sounds bilaterally, no wheezing, rales, rhonchi.  CARDIOVASCULAR: S1, S2 normal. No murmurs, rubs, or gallops.  ABDOMEN: Soft, nontender, nondistended.  NEUROLOGIC: patient is awake but mute limited exam due to patient participation LABORATORY PANEL:  CBC Recent Labs  Lab 10/04/21 1901  WBC 12.5*  HGB 16.2*  HCT 49.6*  PLT 378    Chemistries  Recent Labs  Lab 10/04/21 1901  NA 145  K 3.3*  CL 107  CO2 28  GLUCOSE 141*  BUN 32*  CREATININE 0.83  CALCIUM 8.6*  AST 41  ALT 30  ALKPHOS 94  BILITOT 0.6   Cardiac Enzymes No results for input(s): TROPONINI in the last 168 hours. RADIOLOGY:  CT Head Wo Contrast  Result Date: 10/04/2021 CLINICAL DATA:  Mental status change, unknown cause EXAM: CT HEAD WITHOUT CONTRAST TECHNIQUE: Contiguous axial images were obtained from the base of the skull through the vertex without intravenous contrast. RADIATION DOSE REDUCTION: This exam was performed according to the departmental dose-optimization program which includes automated exposure control, adjustment of the mA and/or kV according to patient size and/or use of iterative reconstruction technique. COMPARISON:  CT head 04/27/2021 FINDINGS: Brain: No evidence of large-territorial acute  infarction. No parenchymal hemorrhage. No mass lesion. No extra-axial collection. No mass effect or midline shift. No hydrocephalus. Basilar cisterns are patent. Vascular: No hyperdense vessel. Skull: No acute fracture or focal lesion. Sinuses/Orbits: Mild mucosal thickening of the left sphenoid sinus. Otherwise visualized paranasal sinuses and mastoid air cells are clear. The orbits are unremarkable. Other: None. IMPRESSION: No acute intracranial abnormality. Electronically Signed   By: Iven Finn M.D.   On: 10/04/2021 20:26   CT Angio Chest PE W and/or Wo Contrast  Result Date: 10/04/2021 CLINICAL DATA:  Altered mental status EXAM: CT ANGIOGRAPHY CHEST WITH CONTRAST TECHNIQUE: Multidetector CT imaging of the chest was performed using the standard protocol during bolus administration of intravenous contrast. Multiplanar CT image reconstructions and MIPs were obtained to evaluate the vascular anatomy. RADIATION DOSE REDUCTION: This exam was performed according to the departmental dose-optimization program which includes automated exposure control, adjustment of the mA and/or kV according to patient size and/or use of iterative reconstruction technique. CONTRAST:  21mL OMNIPAQUE IOHEXOL 350 MG/ML SOLN COMPARISON:  CT 11/21/2012 FINDINGS: Cardiovascular: Satisfactory opacification of the pulmonary arteries to the segmental level. No evidence of pulmonary embolism. Normal heart size. No pericardial effusion. Nonaneurysmal aorta. Persistent left-sided SVC that drains into the coronary sinus. Cardiac size within normal limits. No pericardial effusion. Mediastinum/Nodes: Midline trachea. No suspicious lymph nodes. Hypodense nodule right lobe of thyroid measuring 1.7 cm. Lungs/Pleura: Lungs are clear. No pleural effusion or pneumothorax. Upper Abdomen: No acute abnormality. Musculoskeletal: No chest wall abnormality. No acute or significant osseous findings. Review of the MIP images confirms the above  findings.  IMPRESSION: 1. Negative for acute pulmonary embolus. 2. 1.7 cm hypodense nodule right lobe of thyroid. Recommend thyroid US (ref: J Am Coll Radiol. 2015 Feb;12(2): 143-50).This may be performed on a nonemergent basis. Aortic Atherosclerosis (ICD10-I70.0). Electronically Signed   By: Donavan Foil M.D.   On: 10/04/2021 23:20   DG Chest Portable 1 View  Result Date: 10/04/2021 CLINICAL DATA:  Possible sepsis EXAM: PORTABLE CHEST 1 VIEW COMPARISON:  04/27/2021 FINDINGS: The heart size and mediastinal contours are within normal limits. Both lungs are clear. The visualized skeletal structures are unremarkable. IMPRESSION: No active disease. Electronically Signed   By: Inez Catalina M.D.   On: 10/04/2021 20:32   US THYROID  Result Date: 10/05/2021 CLINICAL DATA:  Goiter. EXAM: THYROID ULTRASOUND TECHNIQUE: Ultrasound examination of the thyroid gland and adjacent soft tissues was performed. COMPARISON:  None. FINDINGS: Parenchymal Echotexture: Markedly heterogenous Isthmus: 0.3 cm Right lobe: 4.6 x 1.8 x 2.1 cm Left lobe: 5.0 x 1.3 x 2.0 cm _________________________________________________________ Estimated total number of nodules >/= 1 cm: 1 Number of spongiform nodules >/=  2 cm not described below (TR1): 0 Number of mixed cystic and solid nodules >/= 1.5 cm not described below ([place]): 0 _________________________________________________________ Nodule # 1: Location: Right; Inferior Maximum size: 1.8 cm; Other 2 dimensions: 1.5 x 1.5 cm Composition: solid/almost completely solid (2) Echogenicity: isoechoic (1) Shape: not taller-than-wide (0) Margins: ill-defined (0) Echogenic foci: none (0) ACR TI-RADS total points: 3. ACR TI-RADS risk category: TR3 (3 points). ACR TI-RADS recommendations: *Given size (>/= 1.5 - 2.4 cm) and appearance, a follow-up ultrasound in 1 year should be considered based on TI-RADS criteria. _________________________________________________________ IMPRESSION: 1. Diffusely heterogeneous and  enlarged thyroid gland most consistent with diffuse goitrous change. 2. Questionable nodule versus pseudo nodule in the right inferior gland. This most likely represents an approximately 1.8 cm TI-RADS category 3 nodule which meets consensus criteria for imaging surveillance. Recommend follow-up ultrasound in 1 year. The above is in keeping with the ACR TI-RADS recommendations - J Am Coll Radiol 2017;14:587-595. Electronically Signed   By: Jacqulynn Cadet M.D.   On: 10/05/2021 09:37    Assessment and Plan WILLEAN SCHURMAN is a 46 y.o. female with medical history significant of altered mental status, takes, psychosis, Ehlers-Danlos syndrome, fibromyalgia presenting by EMS for lying on the floor for last several days and not moving Per ED providers note.  Patient unable to provide history no family at bedside.  Sepsis secondary to UTI -Patient started on Rocephin -- urine cultures pending -- blood cultures negative so far -- lactic acid levels 2.2 --received IVF per sepsis protocol   Elevated troponin: -appears demand ischemia likely due to sepsis. --pt unable to give any history or ROS --troponin remains flat   history of paranoid psychosis with hallucinations and delusions/?catatonia medication noncompliance -- discussed with Dr. [name] who is well aware of patient and will see patient.  Failure to thrive/significant weight loss of more than 30 pounds per mother -- dietitian to see patient -- overall seems to have a poor long-term prognosis  severe major depression Depression and anxiety: -Await psychiatry eval and restart antianxiety antidepression meds.   Thyroid nodule: - thyroid ultrasound Diffusely heterogeneous and enlarged thyroid gland most consistent with diffuse goitrous change. 2. Questionable nodule versus pseudo nodule in the right inferior gland. This most likely represents an approximately 1.8 cm TI-RADS category 3 nodule which meets consensus criteria for  imaging surveillance. Recommend follow-up ultrasound in 1 year.  -- Patient  will follow-up with PCP  patient overall has a very poor prognosis. Palliative care to see patient. TOC for discharge planning. I will need to address code status with mother   Procedures: Family communication : none Consults : psychiatry CODE STATUS: full DVT Prophylaxis : heparin Level of care: Telemetry Medical Status is: Inpatient Remains inpatient appropriate because: failure to thrive, UTI, paranoia/?catatonia            TOTAL TIME TAKING CARE OF THIS PATIENT: 35 minutes.  >50% time spent on counselling and coordination of care  Note: This dictation was prepared with Dragon dictation along with smaller phrase technology. Any transcriptional errors that result from this process are unintentional.  Fritzi Mandes M.D    [facility]   CC: Primary care physician; Birdie Sons, MD

## 2021-10-05 NOTE — Procedures (Signed)
History: 46 year old female being evaluated for altered mental status  Sedation: None  Technique: This EEG was acquired with electrodes placed according to the International 10-20 electrode system (including Fp1, Fp2, F3, F4, C3, C4, P3, P4, O1, O2, T3, T4, T5, T6, A1, A2, Fz, Cz, Pz). The following electrodes were missing or displaced: none.   Background: The background consists predominantly of intermixed alpha and beta activities.  There is a well defined posterior dominant rhythm of eight hz that attenuates with eye opening.  There is an increase in slow activity associated with drowsiness.  Sleep is recorded with symmetric appearing structures.   Photic stimulation: Physiologic driving is present  EEG Abnormalities: None  Clinical Interpretation: This normal EEG was recorded in the awake and asleep state. There was no seizure or seizure predisposition recorded on this study. Please note that lack of epileptiform activity on EEG does not preclude the possibility of epilepsy.   Roland Rack, MD [facility] [phone]  If 7pm- 7am, please page neurology on call as listed in [place].

## 2021-10-05 NOTE — Progress Notes (Signed)
Pt repeatedly stating she has round worms and she needs something to treat them. She is also convinced that she has been given a round-up shake.

## 2021-10-05 NOTE — Telephone Encounter (Signed)
FYI

## 2021-10-06 ENCOUNTER — Telehealth: Payer: Self-pay

## 2021-10-06 DIAGNOSIS — N390 Urinary tract infection, site not specified: Secondary | ICD-10-CM

## 2021-10-06 DIAGNOSIS — F418 Other specified anxiety disorders: Secondary | ICD-10-CM

## 2021-10-06 DIAGNOSIS — F061 Catatonic disorder due to known physiological condition: Secondary | ICD-10-CM | POA: Diagnosis not present

## 2021-10-06 DIAGNOSIS — Z515 Encounter for palliative care: Secondary | ICD-10-CM

## 2021-10-06 DIAGNOSIS — G40909 Epilepsy, unspecified, not intractable, without status epilepticus: Secondary | ICD-10-CM

## 2021-10-06 LAB — BASIC METABOLIC PANEL WITH GFR
Anion gap: 7 (ref 5–15)
BUN: 11 mg/dL (ref 6–20)
CO2: 25 mmol/L (ref 22–32)
Calcium: 8.5 mg/dL — ABNORMAL LOW (ref 8.9–10.3)
Chloride: 108 mmol/L (ref 98–111)
Creatinine, Ser: 0.56 mg/dL (ref 0.44–1.00)
GFR, Estimated: >60 mL/min
Glucose, Bld: 89 mg/dL (ref 70–99)
Potassium: 3.3 mmol/L — ABNORMAL LOW (ref 3.5–5.1)
Sodium: 140 mmol/L (ref 135–145)

## 2021-10-06 LAB — CBC
HCT: 36.6 % (ref 36.0–46.0)
Hemoglobin: 12 g/dL (ref 12.0–15.0)
MCH: 27.6 pg (ref 26.0–34.0)
MCHC: 32.8 g/dL (ref 30.0–36.0)
MCV: 84.1 fL (ref 80.0–100.0)
Platelets: 269 10*3/uL (ref 150–400)
RBC: 4.35 MIL/uL (ref 3.87–5.11)
RDW: 15.4 % (ref 11.5–15.5)
WBC: 9.4 10*3/uL (ref 4.0–10.5)
nRBC: 0 % (ref 0.0–0.2)

## 2021-10-06 LAB — ECHOCARDIOGRAM COMPLETE
Area-P 1/2: 3.17 cm2
Height: 63 in
S' Lateral: 1.9 cm
Weight: 2144.63 oz

## 2021-10-06 LAB — POTASSIUM: Potassium: 4.1 mmol/L (ref 3.5–5.1)

## 2021-10-06 MED ORDER — POTASSIUM CHLORIDE 10 MEQ/100ML IV SOLN
10.0000 meq | INTRAVENOUS | Status: AC
  Administered 2021-10-06 (×2): 10 meq via INTRAVENOUS
  Filled 2021-10-06 (×2): qty 100

## 2021-10-06 MED ORDER — POTASSIUM CHLORIDE IN NACL 40-0.9 MEQ/L-% IV SOLN
INTRAVENOUS | Status: DC
Start: 2021-10-06 — End: 2021-10-08
  Filled 2021-10-06 (×5): qty 1000

## 2021-10-06 MED ORDER — LORAZEPAM 2 MG/ML IJ SOLN: 1.0000 mg | Freq: Four times a day (QID) | INTRAMUSCULAR | Status: DC | PRN

## 2021-10-06 NOTE — Progress Notes (Signed)
Progress note:  Consult acknowledged.   After reviewing the patient's chart I assessed patient at bedside.  She is nonverbal and nonresponsive.  Breathing is unlabored.  Patient appears to be in no apparent distress.  No family at bedside. Attempted to speak with mother/decision maker via telephone. No answer and unable to leave a VM.  I will continue to attempt with speak with patient and family regarding palliative needs and goals of care.  [place] Ilsa Iha, FNP-BC Palliative Medicine Team Team Phone # [phone]  NO CHARGE

## 2021-10-06 NOTE — Consult Note (Signed)
PHARMACY CONSULT NOTE - FOLLOW UP  Pharmacy Consult for Electrolyte Monitoring and Replacement   Recent Labs: Potassium (mmol/L)  Date Value  10/06/2021 3.3 (L)  02/19/2014 3.9   Magnesium (mg/dL)  Date Value  10/05/2021 2.2   Calcium (mg/dL)  Date Value  10/06/2021 8.5 (L)   Calcium, Total (mg/dL)  Date Value  02/19/2014 9.0   Albumin (g/dL)  Date Value  10/04/2021 3.6  10/15/2019 4.5  02/19/2014 3.6   Phosphorus (mg/dL)  Date Value  10/05/2021 2.6   Sodium (mmol/L)  Date Value  10/06/2021 140  10/15/2019 140  02/19/2014 140     Assessment: Pharmacy has been consulted to monitor and replace electrolytes in 45yo patient admitted with sepsis secondary to UTI. Patient with significant weight loss of more than 30 pounds. Dietician also following.  IVF: NS+7mEq K@75ml /h  Goal of Therapy:  Electrolytes WNL  Plan:  K 3.3 --Will add additional KCL 62mEq x 2 doses --will recheck K@1800  this evening as MD would like to ensure level is WNL prior to initiating ECT. --will recheck all electrolytes with AM labs  Pearla Dubonnet ,PharmD Clinical Pharmacist 10/06/2021 2:41 PM

## 2021-10-06 NOTE — Consult Note (Signed)
CARDIOLOGY CONSULT NOTE               Patient ID: Karen [name] MRN: [id number] DOB/AGE: 1975-11-07 46 y.o.  Admit date: 10/04/2021 Referring Physician Fritzi Mandes hospitalist Primary Physician [place] Primary Cardiologist  Reason for Consultation borderline troponin  HPI: Patient is a 46 year old female multiple psychiatric issues Ehlers-Danlos syndrome fibromyalgia headache sleep apnea TIAs presents with catatonia.  Patient possible sepsis not eating not responding not drinking and soiling herself family called rescue and have her brought to the hospital for further evaluation.  Patient was found to have UTI and was given antibiotic therapy troponins were drawn for unclear reasons and there was slightly elevated so cardiology was then consulted patient had echocardiogram which was basically unremarkable EKG is unremarkable no other evidence of any significant cardiac problems patient is unable to give history because of catatonia not responding to questions  Review of systems complete and found to be negative unless listed above     Past Medical History:  Diagnosis Date   Ehlers-Danlos syndrome    Fibromyalgia    Headache    Sleep apnea    Tics of organic origin    Transient alteration of awareness     Past Surgical History:  Procedure Laterality Date   COLONOSCOPY WITH PROPOFOL N/A 02/13/2018   Procedure: COLONOSCOPY WITH PROPOFOL;  Surgeon: [name], MD;  Location: [facility] ENDOSCOPY;  Service: Endoscopy;  Laterality: N/A;   DILATION AND CURETTAGE OF UTERUS  2009   ESOPHAGOGASTRODUODENOSCOPY (EGD) WITH PROPOFOL  02/13/2018   Procedure: ESOPHAGOGASTRODUODENOSCOPY (EGD) WITH PROPOFOL;  Surgeon: [name], MD;  Location: [place] ENDOSCOPY;  Service: Endoscopy;;   EYE SURGERY Left 1990   3 Surgeries on left eye to correct crossed eye   LAPAROSCOPY Left 2003   Fallopian Tube   [place]   3 Surgeries to repair broken arm    OTHER SURGICAL HISTORY Left    3 surgeries on her left thigh as an infant   OTHER SURGICAL HISTORY  1998   Jaw surgery   Right arm surgery  1987   x 3 due to fracture   TOENAIL EXCISION Bilateral 06/13/2019   Procedure: BILATERAL SECOND TOE PARTIAL NAIL ABLATION;  Surgeon: Erle Crocker, MD;  Location: [place];  Service: Orthopedics;  Laterality: Bilateral;  SURGERY REQUEST TIME 1 HOUR   TONSILLECTOMY  12/13/2002    Medications Prior to Admission  Medication Sig Dispense Refill Last Dose   albuterol (VENTOLIN HFA) 108 (90 Base) MCG/ACT inhaler Inhale 2 puffs into the lungs every 6 (six) hours as needed. (Patient not taking: Reported on 10/01/2021) 8 g 12    azelastine (OPTIVAR) 0.05 % ophthalmic solution Apply to eye as directed. 1 drop in each eye prn (Patient not taking: Reported on 10/01/2021)      busPIRone (BUSPAR) 10 MG tablet Take 1 tablet (10 mg total) by mouth 2 (two) times daily. Take along with 15mg  tablet twice a day (Patient not taking: Reported on 10/01/2021) 180 tablet 4    busPIRone (BUSPAR) 15 MG tablet TAKE 1 TABLET BY MOUTH TWICE A DAY (Patient not taking: Reported on 10/01/2021) 180 tablet 2    celecoxib (CELEBREX) 200 MG capsule TAKE 1 CAPSULE BY MOUTH TWICE A DAY (Patient not taking: Reported on 10/01/2021) 60 capsule 1    cyclobenzaprine (FLEXERIL) 5 MG tablet Take 1 tablet (5 mg total) by mouth 3 (three) times daily as needed for  muscle spasms. Do not take within 6 hours of taking tizandine (Patient not taking: Reported on 10/01/2021) 30 tablet 2    diclofenac Sodium (VOLTAREN) 1 % GEL APPLY AS NEEDED 3 TIMES A DAY (Patient not taking: Reported on 10/01/2021) 100 g 0    DULoxetine (CYMBALTA) 60 MG capsule TAKE 2 CAPSULES BY MOUTH EVERY DAY (Patient not taking: Reported on 10/01/2021) 180 capsule 3    fentaNYL (DURAGESIC) 100 MCG/HR Place 1 patch onto the skin every 3 (three) days. (Patient not taking: Reported on 10/01/2021) 10 patch 0    fexofenadine  (ALLEGRA) 180 MG tablet Take 180 mg by mouth daily. (Patient not taking: Reported on 10/01/2021)      fluticasone (FLONASE) 50 MCG/ACT nasal spray  (Patient not taking: Reported on 10/01/2021)      fluticasone (FLOVENT HFA) 110 MCG/ACT inhaler Inhale 2 puffs into the lungs 2 (two) times daily. (Patient not taking: Reported on 10/01/2021)      Fluticasone-Salmeterol (ADVAIR) 250-50 MCG/DOSE AEPB Inhale 1 puff into the lungs every 12 (twelve) hours. Rinse mouth after use (Patient not taking: Reported on 10/01/2021) 60 each 11    gabapentin (NEURONTIN) 600 MG tablet TAKE 2 TABLETS IN THE MORNING AND 3 TABLETS AT BEDTIME (Patient not taking: Reported on 10/01/2021) 150 tablet 3    haloperidol (HALDOL) 0.5 MG tablet Take 0.5 tablets (0.25 mg total) by mouth 2 (two) times daily. (Patient not taking: Reported on 10/01/2021) 60 tablet 3    haloperidol (HALDOL) 1 MG tablet Take 1 mg by mouth 2 (two) times daily. Patient states she should be and has been on 5 mg twice daily. (Patient not taking: Reported on 10/01/2021)      naloxone [facility]) nasal spray 4 mg/0.1 mL Place 1 spray into the nose as needed. (Patient not taking: Reported on 10/01/2021)      omeprazole (PRILOSEC) 40 MG capsule Take 1 capsule (40 mg total) by mouth 2 (two) times daily. (Patient not taking: Reported on 10/01/2021) 60 capsule 6    ondansetron (ZOFRAN-ODT) 4 MG disintegrating tablet DISSOLVE 1 TABLET IN MOUTH EVERY 8 HOURS FOR NAUSEA AND VOMITING (Patient not taking: Reported on 10/01/2021) 30 tablet 5    pregabalin (LYRICA) 300 MG capsule Take 1 capsule (300 mg total) by mouth 2 (two) times daily. (Patient not taking: Reported on 10/01/2021) 180 capsule 0    QUEtiapine (SEROQUEL) 200 MG tablet TAKE 1 TABLET BY MOUTH AT BEDTIME. (Patient not taking: Reported on 10/01/2021) 90 tablet 5    Sod Picosulfate-Mag Ox-Cit Acd (CLENPIQ) 10-3.5-12 MG-GM -GM/160ML SOLN Take 320 mLs by mouth as directed. (Patient not taking: Reported on 10/01/2021) 320 mL 0     tamsulosin (FLOMAX) 0.4 MG CAPS capsule Take 1 capsule (0.4 mg total) by mouth daily. (Patient not taking: Reported on 10/01/2021) 90 capsule 3    tiZANidine (ZANAFLEX) 4 MG tablet TAKE 0.5-1 TABLETS (2-4 MG TOTAL) BY MOUTH EVERY 8 (EIGHT) HOURS AS NEEDED FOR MUSCLE SPASMS. (Patient not taking: Reported on 10/01/2021) 270 tablet 1    Social History   Socioeconomic History   Marital status: Single    Spouse name: Not on file   Number of children: Not on file   Years of education: Not on file   Highest education level: Not on file  Occupational History   Not on file  Tobacco Use   Smoking status: Never   Smokeless tobacco: Never  Vaping Use   Vaping Use: Never used  Substance and Sexual Activity  Alcohol use: No    Alcohol/week: 0.0 standard drinks   Drug use: No   Sexual activity: Never  Other Topics Concern   Not on file  Social History Narrative   Karen [name] is 76.   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    She lives with her mother.    She enjoys reading, watching TV, writing, and painting.    Social Determinants of Health   Financial Resource Strain: Not on file  Food Insecurity: Not on file  Transportation Needs: Not on file  Physical Activity: Not on file  Stress: Not on file  Social Connections: Not on file  Intimate Partner Violence: Not on file    Family History  Problem Relation Age of Onset   Depression Mother    Stroke Mother    Fibromyalgia Mother    Osteoarthritis Mother    Asthma Brother    Depression Brother    Migraines Brother    Asperger's syndrome Brother    Other Brother        Ehlers-Danlos Syndrome   Cancer Maternal Aunt    Asperger's syndrome Maternal Aunt    Breast cancer Maternal Aunt    Heart disease Paternal Aunt    Heart disease Paternal Uncle    Cervical cancer Maternal Grandmother    Osteoporosis Maternal Grandmother        Died at 29   Osteoarthritis Maternal Grandmother    Colon cancer Maternal Grandfather    Asthma Maternal  Grandfather    Asperger's syndrome Maternal Grandfather    Emphysema Maternal Grandfather        Died at 8   Prostate cancer Maternal Grandfather    Heart attack Paternal Grandfather        Died at 60   Asthma Paternal Grandfather    Tuberculosis Paternal Grandfather    Cancer Cousin    Asperger's syndrome Cousin    Asperger's syndrome Other    Heart Problems Paternal Grandmother        Died at 87   Ehlers-Danlos syndrome Father    Ehlers-Danlos syndrome Brother       Review of systems complete and found to be negative unless listed above      PHYSICAL EXAM Patient catatonic not responding General: Well developed, well nourished, in no acute distress HEENT:  Normocephalic and atramatic Neck:  No JVD.  Lungs: Clear bilaterally to auscultation and percussion. Heart: HRRR . Normal S1 and S2 without gallops or 2/6 sem murmurs.  Abdomen: Bowel sounds are positive, abdomen soft and non-tender  Msk:  Back normal, normal gait. Normal strength and tone for age. Extremities: No clubbing, cyanosis or edema.   Neuro: Patient catatonic and not responding Psych: Catatonic  Labs:   Lab Results  Component Value Date   WBC 9.4 10/06/2021   HGB 12.0 10/06/2021   HCT 36.6 10/06/2021   MCV 84.1 10/06/2021   PLT 269 10/06/2021    Recent Labs  Lab 10/04/21 1901 10/06/21 0459  NA 145 140  K 3.3* 3.3*  CL 107 108  CO2 28 25  BUN 32* 11  CREATININE 0.83 0.56  CALCIUM 8.6* 8.5*  PROT 6.9  --   BILITOT 0.6  --   ALKPHOS 94  --   ALT 30  --   AST 41  --   GLUCOSE 141* 89   Lab Results  Component Value Date   CKTOTAL 200 10/04/2021   CKMB < 0.5 (L) 11/21/2012   TROPONINI <0.03 10/28/2017  Lab Results  Component Value Date   CHOL 189 10/15/2019   Lab Results  Component Value Date   HDL 70 10/15/2019   Lab Results  Component Value Date   LDLCALC 108 (H) 10/15/2019   Lab Results  Component Value Date   TRIG 57 10/15/2019   No results found for: CHOLHDL No  results found for: LDLDIRECT    Radiology: CT Head Wo Contrast  Result Date: 10/04/2021 CLINICAL DATA:  Mental status change, unknown cause EXAM: CT HEAD WITHOUT CONTRAST TECHNIQUE: Contiguous axial images were obtained from the base of the skull through the vertex without intravenous contrast. RADIATION DOSE REDUCTION: This exam was performed according to the departmental dose-optimization program which includes automated exposure control, adjustment of the mA and/or kV according to patient size and/or use of iterative reconstruction technique. COMPARISON:  CT head 04/27/2021 FINDINGS: Brain: No evidence of large-territorial acute infarction. No parenchymal hemorrhage. No mass lesion. No extra-axial collection. No mass effect or midline shift. No hydrocephalus. Basilar cisterns are patent. Vascular: No hyperdense vessel. Skull: No acute fracture or focal lesion. Sinuses/Orbits: Mild mucosal thickening of the left sphenoid sinus. Otherwise visualized paranasal sinuses and mastoid air cells are clear. The orbits are unremarkable. Other: None. IMPRESSION: No acute intracranial abnormality. Electronically Signed   By: Iven Finn M.D.   On: 10/04/2021 20:26   CT Angio Chest PE W and/or Wo Contrast  Result Date: 10/04/2021 CLINICAL DATA:  Altered mental status EXAM: CT ANGIOGRAPHY CHEST WITH CONTRAST TECHNIQUE: Multidetector CT imaging of the chest was performed using the standard protocol during bolus administration of intravenous contrast. Multiplanar CT image reconstructions and MIPs were obtained to evaluate the vascular anatomy. RADIATION DOSE REDUCTION: This exam was performed according to the departmental dose-optimization program which includes automated exposure control, adjustment of the mA and/or kV according to patient size and/or use of iterative reconstruction technique. CONTRAST:  34mL OMNIPAQUE IOHEXOL 350 MG/ML SOLN COMPARISON:  CT 11/21/2012 FINDINGS: Cardiovascular: Satisfactory  opacification of the pulmonary arteries to the segmental level. No evidence of pulmonary embolism. Normal heart size. No pericardial effusion. Nonaneurysmal aorta. Persistent left-sided SVC that drains into the coronary sinus. Cardiac size within normal limits. No pericardial effusion. Mediastinum/Nodes: Midline trachea. No suspicious lymph nodes. Hypodense nodule right lobe of thyroid measuring 1.7 cm. Lungs/Pleura: Lungs are clear. No pleural effusion or pneumothorax. Upper Abdomen: No acute abnormality. Musculoskeletal: No chest wall abnormality. No acute or significant osseous findings. Review of the MIP images confirms the above findings. IMPRESSION: 1. Negative for acute pulmonary embolus. 2. 1.7 cm hypodense nodule right lobe of thyroid. Recommend thyroid US (ref: J Am Coll Radiol. 2015 Feb;12(2): 143-50).This may be performed on a nonemergent basis. Aortic Atherosclerosis (ICD10-I70.0). Electronically Signed   By: Donavan Foil M.D.   On: 10/04/2021 23:20   DG Chest Portable 1 View  Result Date: 10/04/2021 CLINICAL DATA:  Possible sepsis EXAM: PORTABLE CHEST 1 VIEW COMPARISON:  04/27/2021 FINDINGS: The heart size and mediastinal contours are within normal limits. Both lungs are clear. The visualized skeletal structures are unremarkable. IMPRESSION: No active disease. Electronically Signed   By: Inez Catalina M.D.   On: 10/04/2021 20:32   EEG adult  Result Date: 10/05/2021 Greta Doom, MD     10/05/2021  3:22 PM History: 46 year old female being evaluated for altered mental status Sedation: None Technique: This EEG was acquired with electrodes placed according to the International 10-20 electrode system (including Fp1, Fp2, F3, F4, C3, C4, P3, P4, O1, O2, T3, T4,  T5, T6, A1, A2, Fz, Cz, Pz). The following electrodes were missing or displaced: none. Background: The background consists predominantly of intermixed alpha and beta activities.  There is a well defined posterior dominant rhythm of  eight hz that attenuates with eye opening.  There is an increase in slow activity associated with drowsiness.  Sleep is recorded with symmetric appearing structures. Photic stimulation: Physiologic driving is present EEG Abnormalities: None Clinical Interpretation: This normal EEG was recorded in the awake and asleep state. There was no seizure or seizure predisposition recorded on this study. Please note that lack of epileptiform activity on EEG does not preclude the possibility of epilepsy. Roland Rack, MD [facility] [phone] If 7pm- 7am, please page neurology on call as listed in [place].   ECHOCARDIOGRAM COMPLETE  Result Date: 10/06/2021    ECHOCARDIOGRAM REPORT   Patient Name:   Karen [name] Date of Exam: 10/05/2021 Medical Rec #:  [id number]         Height:       63.0 in Accession #:    [id number]        Weight:       134.0 lb Date of Birth:  11-05-1975         BSA:          1.631 m Patient Age:    50 years          BP:           133/96 mmHg Patient Gender: F                 HR:           76 bpm. Exam Location:  [facility] Procedure: 2D Echo, Cardiac Doppler and Color Doppler Indications:     Elevated Troponin  History:         Patient has prior history of Echocardiogram examinations, most                  recent 01/02/2018. Risk Factors:Sleep Apnea. Ehlers-Danlos                  Syndrome.  Sonographer:     Cresenciano Lick RDCS Referring Phys:  [place] Diagnosing Phys: Nelva Bush MD IMPRESSIONS  1. Left ventricular ejection fraction, by estimation, is 60 to 65%. The left ventricle has normal function. The left ventricle has no regional wall motion abnormalities. Left ventricular diastolic parameters are consistent with Grade I diastolic dysfunction (impaired relaxation).  2. Right ventricular systolic function is normal. The right ventricular size is normal.  3. The mitral valve is grossly normal. Trivial mitral valve regurgitation. No evidence of mitral  stenosis.  4. The aortic valve has an indeterminant number of cusps. Aortic valve regurgitation is not visualized. No aortic stenosis is present.  5. The inferior vena cava is normal in size with <50% respiratory variability, suggesting right atrial pressure of 8 mmHg. FINDINGS  Left Ventricle: Left ventricular ejection fraction, by estimation, is 60 to 65%. The left ventricle has normal function. The left ventricle has no regional wall motion abnormalities. The left ventricular internal cavity size was normal in size. There is  borderline left ventricular hypertrophy. Left ventricular diastolic parameters are consistent with Grade I diastolic dysfunction (impaired relaxation). Right Ventricle: The right ventricular size is normal. No increase in right ventricular wall thickness. Right ventricular systolic function is normal. Left Atrium: Left atrial size was normal in size. Right Atrium: Right atrial size was normal in size. Pericardium:  There is no evidence of pericardial effusion. Mitral Valve: The mitral valve is grossly normal. Trivial mitral valve regurgitation. No evidence of mitral valve stenosis. Tricuspid Valve: The tricuspid valve is normal in structure. Tricuspid valve regurgitation is trivial. Aortic Valve: The aortic valve has an indeterminant number of cusps. Aortic valve regurgitation is not visualized. No aortic stenosis is present. Pulmonic Valve: The pulmonic valve was not well visualized. Pulmonic valve regurgitation is not visualized. No evidence of pulmonic stenosis. Aorta: The aortic root, ascending aorta and aortic arch are all structurally normal, with no evidence of dilitation or obstruction. Pulmonary Artery: The pulmonary artery is not well seen. Venous: The inferior vena cava is normal in size with less than 50% respiratory variability, suggesting right atrial pressure of 8 mmHg. IAS/Shunts: No atrial level shunt detected by color flow Doppler.  LEFT VENTRICLE PLAX 2D LVIDd:         3.20  cm   Diastology LVIDs:         1.90 cm   LV e' medial:    6.96 cm/s LV PW:         1.00 cm   LV E/e' medial:  8.6 LV IVS:        1.00 cm   LV e' lateral:   7.94 cm/s LVOT diam:     1.60 cm   LV E/e' lateral: 7.5 LV SV:         44 LV SV Index:   27 LVOT Area:     2.01 cm  RIGHT VENTRICLE             IVC RV Basal diam:  2.00 cm     IVC diam: 1.30 cm RV S prime:     13.40 cm/s TAPSE (M-mode): 1.9 cm LEFT ATRIUM             Index        RIGHT ATRIUM          Index LA diam:        3.20 cm 1.96 cm/m   RA Area:     7.51 cm LA Vol (A2C):   17.1 ml 10.48 ml/m  RA Volume:   14.50 ml 8.89 ml/m LA Vol (A4C):   15.8 ml 9.69 ml/m LA Biplane Vol: 17.0 ml 10.42 ml/m  AORTIC VALVE LVOT Vmax:   106.00 cm/s LVOT Vmean:  81.100 cm/s LVOT VTI:    0.218 m  AORTA Ao Root diam: 3.40 cm Ao Asc diam:  3.00 cm MITRAL VALVE MV Area (PHT): 3.17 cm    SHUNTS MV Decel Time: 239 msec    Systemic VTI:  0.22 m MV E velocity: 59.70 cm/s  Systemic Diam: 1.60 cm MV A velocity: 80.50 cm/s MV E/A ratio:  0.74 Harrell Gave End MD Electronically signed by Nelva Bush MD Signature Date/Time: 10/06/2021/7:40:52 AM    Final    US THYROID  Result Date: 10/05/2021 CLINICAL DATA:  Goiter. EXAM: THYROID ULTRASOUND TECHNIQUE: Ultrasound examination of the thyroid gland and adjacent soft tissues was performed. COMPARISON:  None. FINDINGS: Parenchymal Echotexture: Markedly heterogenous Isthmus: 0.3 cm Right lobe: 4.6 x 1.8 x 2.1 cm Left lobe: 5.0 x 1.3 x 2.0 cm _________________________________________________________ Estimated total number of nodules >/= 1 cm: 1 Number of spongiform nodules >/=  2 cm not described below (TR1): 0 Number of mixed cystic and solid nodules >/= 1.5 cm not described below ([place]): 0 _________________________________________________________ Nodule # 1: Location: Right; Inferior Maximum size: 1.8 cm; Other 2 dimensions: 1.5 x  1.5 cm Composition: solid/almost completely solid (2) Echogenicity: isoechoic (1) Shape: not  taller-than-wide (0) Margins: ill-defined (0) Echogenic foci: none (0) ACR TI-RADS total points: 3. ACR TI-RADS risk category: TR3 (3 points). ACR TI-RADS recommendations: *Given size (>/= 1.5 - 2.4 cm) and appearance, a follow-up ultrasound in 1 year should be considered based on TI-RADS criteria. _________________________________________________________ IMPRESSION: 1. Diffusely heterogeneous and enlarged thyroid gland most consistent with diffuse goitrous change. 2. Questionable nodule versus pseudo nodule in the right inferior gland. This most likely represents an approximately 1.8 cm TI-RADS category 3 nodule which meets consensus criteria for imaging surveillance. Recommend follow-up ultrasound in 1 year. The above is in keeping with the ACR TI-RADS recommendations - J Am Coll Radiol 2017;14:587-595. Electronically Signed   By: Jacqulynn Cadet M.D.   On: 10/05/2021 09:37    EKG: Normal sinus rhythm nonspecific ST-T wave changes  ASSESSMENT AND PLAN:  Elevated troponin Murmur Sepsis UTI Dehydration Catatonia . Plan Continue gentle hydration Agree with broad-spectrum antibiotic therapy for UTI Elevated troponins are inconsequential.  Did not represent direct cardiac problem or condition Murmur is benign flow murmur no further recommendations necessary Recommend conservative cardiac input at this stage     Signed: Yolonda Kida MD 10/06/2021, 3:29 PM

## 2021-10-06 NOTE — Progress Notes (Signed)
Initial Nutrition Assessment  DOCUMENTATION CODES:   Non-severe (moderate) malnutrition in context of chronic illness  INTERVENTION:   -Recommend NGT placement for nutrition:  Initiate Osmolite 1.5 @ 20 ml/hr and increase by 10 ml every 12 hours to goal rate of 50 ml/hr.   45 ml Prosource TF BID.    150 ml free water flush every 4 hours  Tube feeding regimen provides 1880 kcal (100% of needs), 97 grams of protein, and 914 ml of H2O.  Total free water: 1804 ml daily  -If feedings are started, monitor Mg, K, and Phos daily and replete as needed secondary to refeeding risk  NUTRITION DIAGNOSIS:   Moderate Malnutrition related to chronic illness (psychiatric illness) as evidenced by mild fat depletion, moderate fat depletion, mild muscle depletion, moderate muscle depletion.  GOAL:   Patient will meet greater than or equal to 90% of their needs  MONITOR:   PO intake, Supplement acceptance, Labs, Weight trends, Skin, I & O's  REASON FOR ASSESSMENT:   Consult Assessment of nutrition requirement/status  ASSESSMENT:   Karen [name] is a 46 y.o. female with medical history significant of altered mental status, takes, psychosis, Ehlers-Danlos syndrome, fibromyalgia presenting by EMS for lying on the floor for last several days and not moving Per ED providers note.  Patient unable to provide history no family at bedside.  Patient has a history of psychotic episodes in the past patient has been noted to be urinating and defecating on herself per ED note.  The admission requested for urinary tract infection.  Pt admitted with sepsis secondary to UTI.   Reviewed I/O's: -50 ml x 24 hours and +50 ml since admission  UOP: 900 ml x 24 hours  Pt lethargic at time of visit. She did not respond to voice or touch. Noted breakfast tray at bedside, which was unattempted. No family at bedside to obtain further nutrition history.    Reviewed wt hx; pt has experienced a 4.3% wt loss over  the past 6 months, which is not significant for time frame.   Case discussed with MD and RN. MD not opposed to placing NGT, but would like for RD to speak with pt's mother prior to pursuing. RD attempted to reach mother by telephone on 3 separate occasions using 2 different numbers; RD unable to reach mother despite attempts to contact.   Plan for ECT treatments per psychiatry.   Medications reviewed and include 0.9% NaCl with KCl 40 mEq/L infusion @ 75 ml/hr.   Labs reviewed: K: 3.3.    NUTRITION - FOCUSED PHYSICAL EXAM:  Flowsheet Row Most Recent Value  Orbital Region Moderate depletion  Upper Arm Region Mild depletion  Thoracic and Lumbar Region Mild depletion  Buccal Region Mild depletion  Temple Region Moderate depletion  Clavicle Bone Region Moderate depletion  Clavicle and Acromion Bone Region Moderate depletion  Scapular Bone Region Moderate depletion  Dorsal Hand Mild depletion  Patellar Region Mild depletion  Anterior Thigh Region Mild depletion  Posterior Calf Region Mild depletion  Edema (RD Assessment) Mild  Hair Reviewed  Eyes Reviewed  Mouth Reviewed  Skin Reviewed  Nails Reviewed       Diet Order:   Diet Order             DIET DYS 3 Room service appropriate? Yes; Fluid consistency: Thin  Diet effective now                   EDUCATION NEEDS:   Not appropriate  for education at this time  Skin:  Skin Assessment: Reviewed RN Assessment  Last BM:  Unknown  Height:   Ht Readings from Last 1 Encounters:  10/04/21 5\' 3"  (1.6 m)    Weight:   Wt Readings from Last 1 Encounters:  10/04/21 60.8 kg    Ideal Body Weight:  52.3 kg  BMI:  Body mass index is 23.74 kg/m.  Estimated Nutritional Needs:   Kcal:  1800-2000  Protein:  90-105 grams  Fluid:  > 1.8 L    Loistine Chance, RD, LDN, [place] Registered Dietitian II Certified Diabetes Care and Education Specialist Please refer to [facility] for RD and/or RD on-call/weekend/after hours pager

## 2021-10-06 NOTE — Care Management (Signed)
Left message for patient's mother, awaiting call back.  Patient is catatonic and non verbal

## 2021-10-06 NOTE — Progress Notes (Incomplete)
{  Select Note:[id number]} 

## 2021-10-06 NOTE — Progress Notes (Addendum)
[place] [facility] [place] NAME: Karen [name]    MR#:  [id number]  DATE OF BIRTH:  1975/12/14  SUBJECTIVE:   patient was brought in by EMS with her being unresponsive, not eating, not communicating, remaining stiff and defecating and urinating on herself according to mother.  No family at bedside. Patient eyes closed. No communication. Getting IV fluids.   VITALS:  Blood pressure 119/75, pulse 66, temperature 98 F (36.7 C), temperature source Axillary, resp. rate 16, height 5\' 3"  (1.6 m), weight 60.8 kg, SpO2 100 %.  PHYSICAL EXAMINATION:   GENERAL:  46 y.o.-year-old patient lying in the bed with no acute distress. Poor hygiene LUNGS: Normal breath sounds bilaterally, no wheezing, rales, rhonchi.  CARDIOVASCULAR: S1, S2 normal. NEUROLOGIC: patient is asleep limited exam due to patient participation LABORATORY PANEL:  CBC Recent Labs  Lab 10/06/21 0459  WBC 9.4  HGB 12.0  HCT 36.6  PLT 269     Chemistries  Recent Labs  Lab 10/04/21 1901 10/05/21 0030 10/06/21 0459  NA 145  --  140  K 3.3*  --  3.3*  CL 107  --  108  CO2 28  --  25  GLUCOSE 141*  --  89  BUN 32*  --  11  CREATININE 0.83  --  0.56  CALCIUM 8.6*  --  8.5*  MG  --  2.2  --   AST 41  --   --   ALT 30  --   --   ALKPHOS 94  --   --   BILITOT 0.6  --   --     Cardiac Enzymes No results for input(s): TROPONINI in the last 168 hours. RADIOLOGY:  CT Head Wo Contrast  Result Date: 10/04/2021 CLINICAL DATA:  Mental status change, unknown cause EXAM: CT HEAD WITHOUT CONTRAST TECHNIQUE: Contiguous axial images were obtained from the base of the skull through the vertex without intravenous contrast. RADIATION DOSE REDUCTION: This exam was performed according to the departmental dose-optimization program which includes automated exposure control, adjustment of the mA and/or kV according to patient size and/or use of iterative reconstruction technique. COMPARISON:   CT head 04/27/2021 FINDINGS: Brain: No evidence of large-territorial acute infarction. No parenchymal hemorrhage. No mass lesion. No extra-axial collection. No mass effect or midline shift. No hydrocephalus. Basilar cisterns are patent. Vascular: No hyperdense vessel. Skull: No acute fracture or focal lesion. Sinuses/Orbits: Mild mucosal thickening of the left sphenoid sinus. Otherwise visualized paranasal sinuses and mastoid air cells are clear. The orbits are unremarkable. Other: None. IMPRESSION: No acute intracranial abnormality. Electronically Signed   By: Iven Finn M.D.   On: 10/04/2021 20:26   CT Angio Chest PE W and/or Wo Contrast  Result Date: 10/04/2021 CLINICAL DATA:  Altered mental status EXAM: CT ANGIOGRAPHY CHEST WITH CONTRAST TECHNIQUE: Multidetector CT imaging of the chest was performed using the standard protocol during bolus administration of intravenous contrast. Multiplanar CT image reconstructions and MIPs were obtained to evaluate the vascular anatomy. RADIATION DOSE REDUCTION: This exam was performed according to the departmental dose-optimization program which includes automated exposure control, adjustment of the mA and/or kV according to patient size and/or use of iterative reconstruction technique. CONTRAST:  37mL OMNIPAQUE IOHEXOL 350 MG/ML SOLN COMPARISON:  CT 11/21/2012 FINDINGS: Cardiovascular: Satisfactory opacification of the pulmonary arteries to the segmental level. No evidence of pulmonary embolism. Normal heart size. No pericardial effusion. Nonaneurysmal aorta. Persistent left-sided SVC that drains into the coronary  sinus. Cardiac size within normal limits. No pericardial effusion. Mediastinum/Nodes: Midline trachea. No suspicious lymph nodes. Hypodense nodule right lobe of thyroid measuring 1.7 cm. Lungs/Pleura: Lungs are clear. No pleural effusion or pneumothorax. Upper Abdomen: No acute abnormality. Musculoskeletal: No chest wall abnormality. No acute or  significant osseous findings. Review of the MIP images confirms the above findings. IMPRESSION: 1. Negative for acute pulmonary embolus. 2. 1.7 cm hypodense nodule right lobe of thyroid. Recommend thyroid US (ref: J Am Coll Radiol. 2015 Feb;12(2): 143-50).This may be performed on a nonemergent basis. Aortic Atherosclerosis (ICD10-I70.0). Electronically Signed   By: Donavan Foil M.D.   On: 10/04/2021 23:20   DG Chest Portable 1 View  Result Date: 10/04/2021 CLINICAL DATA:  Possible sepsis EXAM: PORTABLE CHEST 1 VIEW COMPARISON:  04/27/2021 FINDINGS: The heart size and mediastinal contours are within normal limits. Both lungs are clear. The visualized skeletal structures are unremarkable. IMPRESSION: No active disease. Electronically Signed   By: Inez Catalina M.D.   On: 10/04/2021 20:32   EEG adult  Result Date: 10/05/2021 Greta Doom, MD     10/05/2021  3:22 PM History: 46 year old female being evaluated for altered mental status Sedation: None Technique: This EEG was acquired with electrodes placed according to the International 10-20 electrode system (including Fp1, Fp2, F3, F4, C3, C4, P3, P4, O1, O2, T3, T4, T5, T6, A1, A2, Fz, Cz, Pz). The following electrodes were missing or displaced: none. Background: The background consists predominantly of intermixed alpha and beta activities.  There is a well defined posterior dominant rhythm of eight hz that attenuates with eye opening.  There is an increase in slow activity associated with drowsiness.  Sleep is recorded with symmetric appearing structures. Photic stimulation: Physiologic driving is present EEG Abnormalities: None Clinical Interpretation: This normal EEG was recorded in the awake and asleep state. There was no seizure or seizure predisposition recorded on this study. Please note that lack of epileptiform activity on EEG does not preclude the possibility of epilepsy. Roland Rack, MD [facility] [phone] If 7pm-  7am, please page neurology on call as listed in [place].   ECHOCARDIOGRAM COMPLETE  Result Date: 10/06/2021    ECHOCARDIOGRAM REPORT   Patient Name:   Karen [name] Date of Exam: 10/05/2021 Medical Rec #:  [id number]         Height:       63.0 in Accession #:    [id number]        Weight:       134.0 lb Date of Birth:  1976-04-23         BSA:          1.631 m Patient Age:    18 years          BP:           133/96 mmHg Patient Gender: F                 HR:           76 bpm. Exam Location:  [facility] Procedure: 2D Echo, Cardiac Doppler and Color Doppler Indications:     Elevated Troponin  History:         Patient has prior history of Echocardiogram examinations, most                  recent 01/02/2018. Risk Factors:Sleep Apnea. Ehlers-Danlos                  Syndrome.  Sonographer:  NaTashia Rodgers-Jones RDCS Referring Phys:  [id number] Gretta Cool Amin Fornwalt Diagnosing Phys: Harrell Gave End MD IMPRESSIONS  1. Left ventricular ejection fraction, by estimation, is 60 to 65%. The left ventricle has normal function. The left ventricle has no regional wall motion abnormalities. Left ventricular diastolic parameters are consistent with Grade I diastolic dysfunction (impaired relaxation).  2. Right ventricular systolic function is normal. The right ventricular size is normal.  3. The mitral valve is grossly normal. Trivial mitral valve regurgitation. No evidence of mitral stenosis.  4. The aortic valve has an indeterminant number of cusps. Aortic valve regurgitation is not visualized. No aortic stenosis is present.  5. The inferior vena cava is normal in size with <50% respiratory variability, suggesting right atrial pressure of 8 mmHg. FINDINGS  Left Ventricle: Left ventricular ejection fraction, by estimation, is 60 to 65%. The left ventricle has normal function. The left ventricle has no regional wall motion abnormalities. The left ventricular internal cavity size was normal in size. There is  borderline left ventricular hypertrophy.  Left ventricular diastolic parameters are consistent with Grade I diastolic dysfunction (impaired relaxation). Right Ventricle: The right ventricular size is normal. No increase in right ventricular wall thickness. Right ventricular systolic function is normal. Left Atrium: Left atrial size was normal in size. Right Atrium: Right atrial size was normal in size. Pericardium: There is no evidence of pericardial effusion. Mitral Valve: The mitral valve is grossly normal. Trivial mitral valve regurgitation. No evidence of mitral valve stenosis. Tricuspid Valve: The tricuspid valve is normal in structure. Tricuspid valve regurgitation is trivial. Aortic Valve: The aortic valve has an indeterminant number of cusps. Aortic valve regurgitation is not visualized. No aortic stenosis is present. Pulmonic Valve: The pulmonic valve was not well visualized. Pulmonic valve regurgitation is not visualized. No evidence of pulmonic stenosis. Aorta: The aortic root, ascending aorta and aortic arch are all structurally normal, with no evidence of dilitation or obstruction. Pulmonary Artery: The pulmonary artery is not well seen. Venous: The inferior vena cava is normal in size with less than 50% respiratory variability, suggesting right atrial pressure of 8 mmHg. IAS/Shunts: No atrial level shunt detected by color flow Doppler.  LEFT VENTRICLE PLAX 2D LVIDd:         3.20 cm   Diastology LVIDs:         1.90 cm   LV e' medial:    6.96 cm/s LV PW:         1.00 cm   LV E/e' medial:  8.6 LV IVS:        1.00 cm   LV e' lateral:   7.94 cm/s LVOT diam:     1.60 cm   LV E/e' lateral: 7.5 LV SV:         44 LV SV Index:   27 LVOT Area:     2.01 cm  RIGHT VENTRICLE             IVC RV Basal diam:  2.00 cm     IVC diam: 1.30 cm RV S prime:     13.40 cm/s TAPSE (M-mode): 1.9 cm LEFT ATRIUM             Index        RIGHT ATRIUM          Index LA diam:        3.20 cm 1.96 cm/m   RA Area:     7.51 cm LA Vol (A2C):   17.1 ml 10.48 ml/m  RA Volume:    14.50 ml 8.89 ml/m LA Vol (A4C):   15.8 ml 9.69 ml/m LA Biplane Vol: 17.0 ml 10.42 ml/m  AORTIC VALVE LVOT Vmax:   106.00 cm/s LVOT Vmean:  81.100 cm/s LVOT VTI:    0.218 m  AORTA Ao Root diam: 3.40 cm Ao Asc diam:  3.00 cm MITRAL VALVE MV Area (PHT): 3.17 cm    SHUNTS MV Decel Time: 239 msec    Systemic VTI:  0.22 m MV E velocity: 59.70 cm/s  Systemic Diam: 1.60 cm MV A velocity: 80.50 cm/s MV E/A ratio:  0.74 Harrell Gave End MD Electronically signed by Nelva Bush MD Signature Date/Time: 10/06/2021/7:40:52 AM    Final    US THYROID  Result Date: 10/05/2021 CLINICAL DATA:  Goiter. EXAM: THYROID ULTRASOUND TECHNIQUE: Ultrasound examination of the thyroid gland and adjacent soft tissues was performed. COMPARISON:  None. FINDINGS: Parenchymal Echotexture: Markedly heterogenous Isthmus: 0.3 cm Right lobe: 4.6 x 1.8 x 2.1 cm Left lobe: 5.0 x 1.3 x 2.0 cm _________________________________________________________ Estimated total number of nodules >/= 1 cm: 1 Number of spongiform nodules >/=  2 cm not described below (TR1): 0 Number of mixed cystic and solid nodules >/= 1.5 cm not described below ([place]): 0 _________________________________________________________ Nodule # 1: Location: Right; Inferior Maximum size: 1.8 cm; Other 2 dimensions: 1.5 x 1.5 cm Composition: solid/almost completely solid (2) Echogenicity: isoechoic (1) Shape: not taller-than-wide (0) Margins: ill-defined (0) Echogenic foci: none (0) ACR TI-RADS total points: 3. ACR TI-RADS risk category: TR3 (3 points). ACR TI-RADS recommendations: *Given size (>/= 1.5 - 2.4 cm) and appearance, a follow-up ultrasound in 1 year should be considered based on TI-RADS criteria. _________________________________________________________ IMPRESSION: 1. Diffusely heterogeneous and enlarged thyroid gland most consistent with diffuse goitrous change. 2. Questionable nodule versus pseudo nodule in the right inferior gland. This most likely represents an  approximately 1.8 cm TI-RADS category 3 nodule which meets consensus criteria for imaging surveillance. Recommend follow-up ultrasound in 1 year. The above is in keeping with the ACR TI-RADS recommendations - J Am Coll Radiol 2017;14:587-595. Electronically Signed   By: Jacqulynn Cadet M.D.   On: 10/05/2021 09:37    Assessment and Plan Karen [name] is a 46 y.o. female with medical history significant of altered mental status, takes, psychosis, Ehlers-Danlos syndrome, fibromyalgia presenting by EMS for lying on the floor for last several days and not moving Per ED providers note.  Patient unable to provide history no family at bedside.  Sepsis secondary to UTI -Patient started on Rocephin x 3 days -- urine cultures pending -- blood cultures negative so far -- lactic acid levels 2.2 --received IVF per sepsis protocol --sepsis resolved   Elevated troponin w/o cardiac history -appears demand ischemia likely due to sepsis. --pt unable to give any history or ROS --troponin remains flat --will have Cardiology dr [name] see pt since pt is likley to get ECT rx -echo unremarkable   history of paranoid psychosis with hallucinations and delusions/?catatonia medication noncompliance -- discussed with Dr. [name] --plans for ECT   Failure to thrive/significant weight loss of more than 30 pounds per mother -- dietitian to see patient -- overall seems to have a poor long-term prognosis  severe major depression Depression and anxiety: -Await psychiatry eval and restart antianxiety antidepression meds. --pt not taking anything orally   Thyroid nodule: - thyroid ultrasound Diffusely heterogeneous and enlarged thyroid gland most consistent with diffuse goitrous change. 2. Questionable nodule versus pseudo nodule in the right inferior gland.  This most likely represents an approximately 1.8 cm TI-RADS category 3 nodule which meets consensus criteria for imaging surveillance. Recommend  follow-up ultrasound in 1 year.  -- Patient will follow-up with PCP       patient overall has a very poor prognosis.  Palliative care to see patient. (Pt is followed by Palliative care as out pt) TOC for discharge planning.    Procedures: Family communication : none Consults : psychiatry, cardiology CODE STATUS: full DVT Prophylaxis : heparin Level of care: Telemetry Medical Status is: Inpatient Remains inpatient appropriate because: failure to thrive, UTI, paranoia/?catatonia            TOTAL TIME TAKING CARE OF THIS PATIENT: 25 minutes.  >50% time spent on counselling and coordination of care  Note: This dictation was prepared with Dragon dictation along with smaller phrase technology. Any transcriptional errors that result from this process are unintentional.  Fritzi Mandes M.D    [facility]   CC: Primary care physician; Birdie Sons, MD

## 2021-10-06 NOTE — Telephone Encounter (Signed)
Copied from [place] [id number]. Topic: General - Other >> Oct 06, 2021  3:15 PM Parke Poisson wrote: Reason for CRM: FYI--None of the home health agencies are willing to take this patient stating they do not have a psych nurse a real nurse?

## 2021-10-07 ENCOUNTER — Telehealth: Payer: Self-pay

## 2021-10-07 ENCOUNTER — Inpatient Hospital Stay: Payer: Medicare Other

## 2021-10-07 ENCOUNTER — Telehealth: Payer: BC Managed Care – PPO

## 2021-10-07 ENCOUNTER — Other Ambulatory Visit: Payer: Self-pay | Admitting: Psychiatry

## 2021-10-07 DIAGNOSIS — F333 Major depressive disorder, recurrent, severe with psychotic symptoms: Secondary | ICD-10-CM

## 2021-10-07 DIAGNOSIS — Z7189 Other specified counseling: Secondary | ICD-10-CM | POA: Diagnosis not present

## 2021-10-07 DIAGNOSIS — E440 Moderate protein-calorie malnutrition: Secondary | ICD-10-CM | POA: Diagnosis not present

## 2021-10-07 DIAGNOSIS — F061 Catatonic disorder due to known physiological condition: Secondary | ICD-10-CM | POA: Diagnosis not present

## 2021-10-07 DIAGNOSIS — Z515 Encounter for palliative care: Secondary | ICD-10-CM

## 2021-10-07 LAB — BASIC METABOLIC PANEL
Anion gap: 10 (ref 5–15)
BUN: 10 mg/dL (ref 6–20)
CO2: 23 mmol/L (ref 22–32)
Calcium: 8.8 mg/dL — ABNORMAL LOW (ref 8.9–10.3)
Chloride: 106 mmol/L (ref 98–111)
Creatinine, Ser: 0.63 mg/dL (ref 0.44–1.00)
GFR, Estimated: >60 mL/min (ref >60)
Glucose, Bld: 73 mg/dL (ref 70–99)
Potassium: 4 mmol/L (ref 3.5–5.1)
Sodium: 139 mmol/L (ref 135–145)

## 2021-10-07 LAB — PHOSPHORUS: Phosphorus: 3.7 mg/dL (ref 2.5–4.6)

## 2021-10-07 LAB — GLUCOSE, CAPILLARY: Glucose-Capillary: 82 mg/dL (ref 70–99)

## 2021-10-07 LAB — MAGNESIUM: Magnesium: 1.9 mg/dL (ref 1.7–2.4)

## 2021-10-07 MED ORDER — FREE WATER: 150.0000 mL | Status: DC

## 2021-10-07 MED ORDER — VITAL HIGH PROTEIN PO LIQD: 1000.0000 mL | ORAL | Status: DC

## 2021-10-07 MED ORDER — OSMOLITE 1.5 CAL PO LIQD: 1000.0000 mL | ORAL | Status: DC

## 2021-10-07 MED ORDER — PROSOURCE TF PO LIQD
45.0000 mL | Freq: Two times a day (BID) | ORAL | Status: DC
  Filled 2021-10-07 (×2): qty 45
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
## 2021-10-07 NOTE — Progress Notes (Signed)
[facility] Cardiology    SUBJECTIVE: AMS catatonic not responding to verbal stimuli   Vitals:   10/06/21 1539 10/07/21 0025 10/07/21 0452 10/07/21 0512  BP: 110/80 118/81 117/81   Pulse: 93 75 68   Resp: 15 20 (!) 22 20  Temp: 98 F (36.7 C) 98.2 F (36.8 C) 97.7 F (36.5 C)   TempSrc: Oral Axillary Oral   SpO2: 100% 98% 97%   Weight:      Height:         Intake/Output Summary (Last 24 hours) at 10/07/2021 0757 Last data filed at 10/07/2021 0500 Gross per 24 hour  Intake 567.34 ml  Output 1850 ml  Net -1282.66 ml      PHYSICAL EXAM  General: Well developed, well nourished, in no acute distress HEENT:  Normocephalic and atramatic Neck:  No JVD.  Lungs: Clear bilaterally to auscultation and percussion. Heart: HRRR . Normal S1 and S2 without gallops or murmurs.  Abdomen: Bowel sounds are positive, abdomen soft and non-tender  Msk:  Back normal, normal gait. Normal strength and tone for age. Extremities: No clubbing, cyanosis or edema.   Neuro: catatonic not responding Psych:  depressed catatonic   LABS: Basic Metabolic Panel: Recent Labs    10/05/21 0030 10/06/21 0459 10/06/21 1743 10/07/21 0432  NA  --  140  --  139  K  --  3.3* 4.1 4.0  CL  --  108  --  106  CO2  --  25  --  23  GLUCOSE  --  89  --  73  BUN  --  11  --  10  CREATININE  --  0.56  --  0.63  CALCIUM  --  8.5*  --  8.8*  MG 2.2  --   --   --   PHOS 2.6  --   --   --    Liver Function Tests: Recent Labs    10/04/21 1901  AST 41  ALT 30  ALKPHOS 94  BILITOT 0.6  PROT 6.9  ALBUMIN 3.6   No results for input(s): LIPASE, AMYLASE in the last 72 hours. CBC: Recent Labs    10/04/21 1901 10/06/21 0459  WBC 12.5* 9.4  NEUTROABS 9.9*  --   HGB 16.2* 12.0  HCT 49.6* 36.6  MCV 83.6 84.1  PLT 378 269   Cardiac Enzymes: Recent Labs    10/04/21 1901  CKTOTAL 200   BNP: Invalid input(s): POCBNP D-Dimer: No results for input(s): DDIMER in the last 72 hours. Hemoglobin A1C: No  results for input(s): HGBA1C in the last 72 hours. Fasting Lipid Panel: No results for input(s): CHOL, HDL, LDLCALC, TRIG, CHOLHDL, LDLDIRECT in the last 72 hours. Thyroid Function Tests: Recent Labs    10/05/21 0030  TSH 1.057   Anemia Panel: No results for input(s): VITAMINB12, FOLATE, FERRITIN, TIBC, IRON, RETICCTPCT in the last 72 hours.  EEG adult  Result Date: 10/05/2021 Greta Doom, MD     10/05/2021  3:22 PM History: 46 year old female being evaluated for altered mental status Sedation: None Technique: This EEG was acquired with electrodes placed according to the International 10-20 electrode system (including Fp1, Fp2, F3, F4, C3, C4, P3, P4, O1, O2, T3, T4, T5, T6, A1, A2, Fz, Cz, Pz). The following electrodes were missing or displaced: none. Background: The background consists predominantly of intermixed alpha and beta activities.  There is a well defined posterior dominant rhythm of eight hz that attenuates with eye opening.  There is  an increase in slow activity associated with drowsiness.  Sleep is recorded with symmetric appearing structures. Photic stimulation: Physiologic driving is present EEG Abnormalities: None Clinical Interpretation: This normal EEG was recorded in the awake and asleep state. There was no seizure or seizure predisposition recorded on this study. Please note that lack of epileptiform activity on EEG does not preclude the possibility of epilepsy. Roland Rack, MD [facility] [phone] If 7pm- 7am, please page neurology on call as listed in [place].   ECHOCARDIOGRAM COMPLETE  Result Date: 10/06/2021    ECHOCARDIOGRAM REPORT   Patient Name:   Karen [name] Date of Exam: 10/05/2021 Medical Rec #:  [id number]         Height:       63.0 in Accession #:    [id number]        Weight:       134.0 lb Date of Birth:  10-01-1975         BSA:          1.631 m Patient Age:    46 years          BP:           133/96 mmHg Patient Gender: F                  HR:           76 bpm. Exam Location:  [facility] Procedure: 2D Echo, Cardiac Doppler and Color Doppler Indications:     Elevated Troponin  History:         Patient has prior history of Echocardiogram examinations, most                  recent 01/02/2018. Risk Factors:Sleep Apnea. Ehlers-Danlos                  Syndrome.  Sonographer:     Cresenciano Lick RDCS Referring Phys:  [place] Diagnosing Phys: Nelva Bush MD IMPRESSIONS  1. Left ventricular ejection fraction, by estimation, is 60 to 65%. The left ventricle has normal function. The left ventricle has no regional wall motion abnormalities. Left ventricular diastolic parameters are consistent with Grade I diastolic dysfunction (impaired relaxation).  2. Right ventricular systolic function is normal. The right ventricular size is normal.  3. The mitral valve is grossly normal. Trivial mitral valve regurgitation. No evidence of mitral stenosis.  4. The aortic valve has an indeterminant number of cusps. Aortic valve regurgitation is not visualized. No aortic stenosis is present.  5. The inferior vena cava is normal in size with <50% respiratory variability, suggesting right atrial pressure of 8 mmHg. FINDINGS  Left Ventricle: Left ventricular ejection fraction, by estimation, is 60 to 65%. The left ventricle has normal function. The left ventricle has no regional wall motion abnormalities. The left ventricular internal cavity size was normal in size. There is  borderline left ventricular hypertrophy. Left ventricular diastolic parameters are consistent with Grade I diastolic dysfunction (impaired relaxation). Right Ventricle: The right ventricular size is normal. No increase in right ventricular wall thickness. Right ventricular systolic function is normal. Left Atrium: Left atrial size was normal in size. Right Atrium: Right atrial size was normal in size. Pericardium: There is no evidence of pericardial effusion. Mitral Valve: The mitral  valve is grossly normal. Trivial mitral valve regurgitation. No evidence of mitral valve stenosis. Tricuspid Valve: The tricuspid valve is normal in structure. Tricuspid valve regurgitation is trivial. Aortic Valve: The aortic valve has an  indeterminant number of cusps. Aortic valve regurgitation is not visualized. No aortic stenosis is present. Pulmonic Valve: The pulmonic valve was not well visualized. Pulmonic valve regurgitation is not visualized. No evidence of pulmonic stenosis. Aorta: The aortic root, ascending aorta and aortic arch are all structurally normal, with no evidence of dilitation or obstruction. Pulmonary Artery: The pulmonary artery is not well seen. Venous: The inferior vena cava is normal in size with less than 50% respiratory variability, suggesting right atrial pressure of 8 mmHg. IAS/Shunts: No atrial level shunt detected by color flow Doppler.  LEFT VENTRICLE PLAX 2D LVIDd:         3.20 cm   Diastology LVIDs:         1.90 cm   LV e' medial:    6.96 cm/s LV PW:         1.00 cm   LV E/e' medial:  8.6 LV IVS:        1.00 cm   LV e' lateral:   7.94 cm/s LVOT diam:     1.60 cm   LV E/e' lateral: 7.5 LV SV:         44 LV SV Index:   27 LVOT Area:     2.01 cm  RIGHT VENTRICLE             IVC RV Basal diam:  2.00 cm     IVC diam: 1.30 cm RV S prime:     13.40 cm/s TAPSE (M-mode): 1.9 cm LEFT ATRIUM             Index        RIGHT ATRIUM          Index LA diam:        3.20 cm 1.96 cm/m   RA Area:     7.51 cm LA Vol (A2C):   17.1 ml 10.48 ml/m  RA Volume:   14.50 ml 8.89 ml/m LA Vol (A4C):   15.8 ml 9.69 ml/m LA Biplane Vol: 17.0 ml 10.42 ml/m  AORTIC VALVE LVOT Vmax:   106.00 cm/s LVOT Vmean:  81.100 cm/s LVOT VTI:    0.218 m  AORTA Ao Root diam: 3.40 cm Ao Asc diam:  3.00 cm MITRAL VALVE MV Area (PHT): 3.17 cm    SHUNTS MV Decel Time: 239 msec    Systemic VTI:  0.22 m MV E velocity: 59.70 cm/s  Systemic Diam: 1.60 cm MV A velocity: 80.50 cm/s MV E/A ratio:  0.74 Harrell Gave End MD  Electronically signed by Nelva Bush MD Signature Date/Time: 10/06/2021/7:40:52 AM    Final    US THYROID  Result Date: 10/05/2021 CLINICAL DATA:  Goiter. EXAM: THYROID ULTRASOUND TECHNIQUE: Ultrasound examination of the thyroid gland and adjacent soft tissues was performed. COMPARISON:  None. FINDINGS: Parenchymal Echotexture: Markedly heterogenous Isthmus: 0.3 cm Right lobe: 4.6 x 1.8 x 2.1 cm Left lobe: 5.0 x 1.3 x 2.0 cm _________________________________________________________ Estimated total number of nodules >/= 1 cm: 1 Number of spongiform nodules >/=  2 cm not described below (TR1): 0 Number of mixed cystic and solid nodules >/= 1.5 cm not described below ([place]): 0 _________________________________________________________ Nodule # 1: Location: Right; Inferior Maximum size: 1.8 cm; Other 2 dimensions: 1.5 x 1.5 cm Composition: solid/almost completely solid (2) Echogenicity: isoechoic (1) Shape: not taller-than-wide (0) Margins: ill-defined (0) Echogenic foci: none (0) ACR TI-RADS total points: 3. ACR TI-RADS risk category: TR3 (3 points). ACR TI-RADS recommendations: *Given size (>/= 1.5 - 2.4 cm) and appearance, a  follow-up ultrasound in 1 year should be considered based on TI-RADS criteria. _________________________________________________________ IMPRESSION: 1. Diffusely heterogeneous and enlarged thyroid gland most consistent with diffuse goitrous change. 2. Questionable nodule versus pseudo nodule in the right inferior gland. This most likely represents an approximately 1.8 cm TI-RADS category 3 nodule which meets consensus criteria for imaging surveillance. Recommend follow-up ultrasound in 1 year. The above is in keeping with the ACR TI-RADS recommendations - J Am Coll Radiol 2017;14:587-595. Electronically Signed   By: Jacqulynn Cadet M.D.   On: 10/05/2021 09:37     Echo NOrmal LVF EF=60%    ASSESSMENT AND PLAN:  Principal Problem:   Catatonia Active Problems:   Depression with  anxiety   Seizure disorder (HCC)   Sepsis secondary to UTI ([place])   AMS (altered mental status)   Thyroid nodule   Hypokalemia   Elevated troponin   Depression, major, recurrent, severe with psychosis (HCC)   Malnutrition of moderate degree    Plan Murmur , benign ECHO no changes AMS catatonic recommend ECT Severe depression w/u as per psych Sepsis UTI continue medical antibx Borderline troponin are not clinically releveant Correct electrolyes K/Mg Continue to improve on nutrition Patient cleared for ECT as amild acceptable risk We will while undergoing ECT therapy   Yolonda Kida, MD, 10/07/2021 7:57 AM

## 2021-10-07 NOTE — Consult Note (Signed)
CARDIOLOGY CONSULT NOTE               Patient ID: Karen [name] MRN: [id number] DOB/AGE: 1976-03-15 46 y.o.  Admit date: 10/04/2021 Referring Physician hospitalist /Dr. [name] Primary Physician Dr. Juanetta Beets Primary Cardiologist  Reason for Consultation preop ECT severe depression catatonia  HPI: Patient is a 46 year old female significant psychiatric history depression fibromyalgia now catatonic not eating not drinking requiring aggressive intervention and possibly ECT since she has failed medical therapy patient was found to have murmur also needed cardiac clearance prior to ECT procedure patient was not helpful during exam or history did not respond to any questions was not arousable lying in a fetal position on the left side patient appeared to be contracted lower extremities.  Did not respond to sternal rub or mild painful stimuli  Review of systems complete and found to be negative unless listed above     Past Medical History:  Diagnosis Date   Ehlers-Danlos syndrome    Fibromyalgia    Headache    Sleep apnea    Tics of organic origin    Transient alteration of awareness     Past Surgical History:  Procedure Laterality Date   COLONOSCOPY WITH PROPOFOL N/A 02/13/2018   Procedure: COLONOSCOPY WITH PROPOFOL;  Surgeon: [name], MD;  Location: [facility] ENDOSCOPY;  Service: Endoscopy;  Laterality: N/A;   DILATION AND CURETTAGE OF UTERUS  2009   ESOPHAGOGASTRODUODENOSCOPY (EGD) WITH PROPOFOL  02/13/2018   Procedure: ESOPHAGOGASTRODUODENOSCOPY (EGD) WITH PROPOFOL;  Surgeon: [name], MD;  Location: [facility] ENDOSCOPY;  Service: Endoscopy;;   EYE SURGERY Left 1990   3 Surgeries on left eye to correct crossed eye   LAPAROSCOPY Left 2003   Fallopian Tube   [place]   3 Surgeries to repair broken arm   OTHER SURGICAL HISTORY Left    3 surgeries on her left thigh as an infant   OTHER SURGICAL HISTORY  1998   Jaw surgery    Right arm surgery  1987   x 3 due to fracture   TOENAIL EXCISION Bilateral 06/13/2019   Procedure: BILATERAL SECOND TOE PARTIAL NAIL ABLATION;  Surgeon: Erle Crocker, MD;  Location: [place];  Service: Orthopedics;  Laterality: Bilateral;  SURGERY REQUEST TIME 1 HOUR   TONSILLECTOMY  12/13/2002    Medications Prior to Admission  Medication Sig Dispense Refill Last Dose   albuterol (VENTOLIN HFA) 108 (90 Base) MCG/ACT inhaler Inhale 2 puffs into the lungs every 6 (six) hours as needed. (Patient not taking: Reported on 10/01/2021) 8 g 12    azelastine (OPTIVAR) 0.05 % ophthalmic solution Apply to eye as directed. 1 drop in each eye prn (Patient not taking: Reported on 10/01/2021)      busPIRone (BUSPAR) 10 MG tablet Take 1 tablet (10 mg total) by mouth 2 (two) times daily. Take along with 15mg  tablet twice a day (Patient not taking: Reported on 10/01/2021) 180 tablet 4    busPIRone (BUSPAR) 15 MG tablet TAKE 1 TABLET BY MOUTH TWICE A DAY (Patient not taking: Reported on 10/01/2021) 180 tablet 2    celecoxib (CELEBREX) 200 MG capsule TAKE 1 CAPSULE BY MOUTH TWICE A DAY (Patient not taking: Reported on 10/01/2021) 60 capsule 1    cyclobenzaprine (FLEXERIL) 5 MG tablet Take 1 tablet (5 mg total) by mouth 3 (three) times daily as needed for muscle spasms. Do not take within 6 hours of taking tizandine (Patient  not taking: Reported on 10/01/2021) 30 tablet 2    diclofenac Sodium (VOLTAREN) 1 % GEL APPLY AS NEEDED 3 TIMES A DAY (Patient not taking: Reported on 10/01/2021) 100 g 0    DULoxetine (CYMBALTA) 60 MG capsule TAKE 2 CAPSULES BY MOUTH EVERY DAY (Patient not taking: Reported on 10/01/2021) 180 capsule 3    fentaNYL (DURAGESIC) 100 MCG/HR Place 1 patch onto the skin every 3 (three) days. (Patient not taking: Reported on 10/01/2021) 10 patch 0    fexofenadine (ALLEGRA) 180 MG tablet Take 180 mg by mouth daily. (Patient not taking: Reported on 10/01/2021)      fluticasone (FLONASE) 50  MCG/ACT nasal spray  (Patient not taking: Reported on 10/01/2021)      fluticasone (FLOVENT HFA) 110 MCG/ACT inhaler Inhale 2 puffs into the lungs 2 (two) times daily. (Patient not taking: Reported on 10/01/2021)      Fluticasone-Salmeterol (ADVAIR) 250-50 MCG/DOSE AEPB Inhale 1 puff into the lungs every 12 (twelve) hours. Rinse mouth after use (Patient not taking: Reported on 10/01/2021) 60 each 11    gabapentin (NEURONTIN) 600 MG tablet TAKE 2 TABLETS IN THE MORNING AND 3 TABLETS AT BEDTIME (Patient not taking: Reported on 10/01/2021) 150 tablet 3    haloperidol (HALDOL) 0.5 MG tablet Take 0.5 tablets (0.25 mg total) by mouth 2 (two) times daily. (Patient not taking: Reported on 10/01/2021) 60 tablet 3    haloperidol (HALDOL) 1 MG tablet Take 1 mg by mouth 2 (two) times daily. Patient states she should be and has been on 5 mg twice daily. (Patient not taking: Reported on 10/01/2021)      naloxone [facility]) nasal spray 4 mg/0.1 mL Place 1 spray into the nose as needed. (Patient not taking: Reported on 10/01/2021)      omeprazole (PRILOSEC) 40 MG capsule Take 1 capsule (40 mg total) by mouth 2 (two) times daily. (Patient not taking: Reported on 10/01/2021) 60 capsule 6    ondansetron (ZOFRAN-ODT) 4 MG disintegrating tablet DISSOLVE 1 TABLET IN MOUTH EVERY 8 HOURS FOR NAUSEA AND VOMITING (Patient not taking: Reported on 10/01/2021) 30 tablet 5    pregabalin (LYRICA) 300 MG capsule Take 1 capsule (300 mg total) by mouth 2 (two) times daily. (Patient not taking: Reported on 10/01/2021) 180 capsule 0    QUEtiapine (SEROQUEL) 200 MG tablet TAKE 1 TABLET BY MOUTH AT BEDTIME. (Patient not taking: Reported on 10/01/2021) 90 tablet 5    Sod Picosulfate-Mag Ox-Cit Acd (CLENPIQ) 10-3.5-12 MG-GM -GM/160ML SOLN Take 320 mLs by mouth as directed. (Patient not taking: Reported on 10/01/2021) 320 mL 0    tamsulosin (FLOMAX) 0.4 MG CAPS capsule Take 1 capsule (0.4 mg total) by mouth daily. (Patient not taking: Reported on 10/01/2021)  90 capsule 3    tiZANidine (ZANAFLEX) 4 MG tablet TAKE 0.5-1 TABLETS (2-4 MG TOTAL) BY MOUTH EVERY 8 (EIGHT) HOURS AS NEEDED FOR MUSCLE SPASMS. (Patient not taking: Reported on 10/01/2021) 270 tablet 1    Social History   Socioeconomic History   Marital status: Single    Spouse name: Not on file   Number of children: Not on file   Years of education: Not on file   Highest education level: Not on file  Occupational History   Not on file  Tobacco Use   Smoking status: Never   Smokeless tobacco: Never  Vaping Use   Vaping Use: Never used  Substance and Sexual Activity   Alcohol use: No    Alcohol/week: 0.0 standard drinks  Drug use: No   Sexual activity: Never  Other Topics Concern   Not on file  Social History Narrative   Gittel is 13.   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    She lives with her mother.    She enjoys reading, watching TV, writing, and painting.    Social Determinants of Health   Financial Resource Strain: Not on file  Food Insecurity: Not on file  Transportation Needs: Not on file  Physical Activity: Not on file  Stress: Not on file  Social Connections: Not on file  Intimate Partner Violence: Not on file    Family History  Problem Relation Age of Onset   Depression Mother    Stroke Mother    Fibromyalgia Mother    Osteoarthritis Mother    Asthma Brother    Depression Brother    Migraines Brother    Asperger's syndrome Brother    Other Brother        Ehlers-Danlos Syndrome   Cancer Maternal Aunt    Asperger's syndrome Maternal Aunt    Breast cancer Maternal Aunt    Heart disease Paternal Aunt    Heart disease Paternal Uncle    Cervical cancer Maternal Grandmother    Osteoporosis Maternal Grandmother        Died at 64   Osteoarthritis Maternal Grandmother    Colon cancer Maternal Grandfather    Asthma Maternal Grandfather    Asperger's syndrome Maternal Grandfather    Emphysema Maternal Grandfather        Died at 38   Prostate cancer  Maternal Grandfather    Heart attack Paternal Grandfather        Died at 50   Asthma Paternal Grandfather    Tuberculosis Paternal Grandfather    Cancer Cousin    Asperger's syndrome Cousin    Asperger's syndrome Other    Heart Problems Paternal Grandmother        Died at 56   Ehlers-Danlos syndrome Father    Ehlers-Danlos syndrome Brother       Review of systems complete and found to be negative unless listed above      PHYSICAL EXAM  General: Well developed, well nourished, in no acute distress HEENT:  Normocephalic and atramatic Neck:  No JVD.  Lungs: Clear bilaterally to auscultation and percussion. Heart: HRRR . Normal S1 and S2 without gallops or murmurs.  Abdomen: Bowel sounds are positive, abdomen soft and non-tender  Msk:  Back normal, normal gait. Normal strength and tone for age. Extremities: No clubbing, cyanosis or edema.   Neuro: Alert and oriented X 3. Psych:  Good affect, responds appropriately  Labs:   Lab Results  Component Value Date   WBC 9.4 10/06/2021   HGB 12.0 10/06/2021   HCT 36.6 10/06/2021   MCV 84.1 10/06/2021   PLT 269 10/06/2021    Recent Labs  Lab 10/04/21 1901 10/06/21 0459 10/07/21 0432  NA 145   < > 139  K 3.3*   < > 4.0  CL 107   < > 106  CO2 28   < > 23  BUN 32*   < > 10  CREATININE 0.83   < > 0.63  CALCIUM 8.6*   < > 8.8*  PROT 6.9  --   --   BILITOT 0.6  --   --   ALKPHOS 94  --   --   ALT 30  --   --   AST 41  --   --  GLUCOSE 141*   < > 73   < > = values in this interval not displayed.   Lab Results  Component Value Date   CKTOTAL 200 10/04/2021   CKMB < 0.5 (L) 11/21/2012   TROPONINI <0.03 10/28/2017    Lab Results  Component Value Date   CHOL 189 10/15/2019   Lab Results  Component Value Date   HDL 70 10/15/2019   Lab Results  Component Value Date   LDLCALC 108 (H) 10/15/2019   Lab Results  Component Value Date   TRIG 57 10/15/2019   No results found for: CHOLHDL No results found for:  LDLDIRECT    Radiology: CT Head Wo Contrast  Result Date: 10/04/2021 CLINICAL DATA:  Mental status change, unknown cause EXAM: CT HEAD WITHOUT CONTRAST TECHNIQUE: Contiguous axial images were obtained from the base of the skull through the vertex without intravenous contrast. RADIATION DOSE REDUCTION: This exam was performed according to the departmental dose-optimization program which includes automated exposure control, adjustment of the mA and/or kV according to patient size and/or use of iterative reconstruction technique. COMPARISON:  CT head 04/27/2021 FINDINGS: Brain: No evidence of large-territorial acute infarction. No parenchymal hemorrhage. No mass lesion. No extra-axial collection. No mass effect or midline shift. No hydrocephalus. Basilar cisterns are patent. Vascular: No hyperdense vessel. Skull: No acute fracture or focal lesion. Sinuses/Orbits: Mild mucosal thickening of the left sphenoid sinus. Otherwise visualized paranasal sinuses and mastoid air cells are clear. The orbits are unremarkable. Other: None. IMPRESSION: No acute intracranial abnormality. Electronically Signed   By: Iven Finn M.D.   On: 10/04/2021 20:26   CT Angio Chest PE W and/or Wo Contrast  Result Date: 10/04/2021 CLINICAL DATA:  Altered mental status EXAM: CT ANGIOGRAPHY CHEST WITH CONTRAST TECHNIQUE: Multidetector CT imaging of the chest was performed using the standard protocol during bolus administration of intravenous contrast. Multiplanar CT image reconstructions and MIPs were obtained to evaluate the vascular anatomy. RADIATION DOSE REDUCTION: This exam was performed according to the departmental dose-optimization program which includes automated exposure control, adjustment of the mA and/or kV according to patient size and/or use of iterative reconstruction technique. CONTRAST:  32mL OMNIPAQUE IOHEXOL 350 MG/ML SOLN COMPARISON:  CT 11/21/2012 FINDINGS: Cardiovascular: Satisfactory opacification of the  pulmonary arteries to the segmental level. No evidence of pulmonary embolism. Normal heart size. No pericardial effusion. Nonaneurysmal aorta. Persistent left-sided SVC that drains into the coronary sinus. Cardiac size within normal limits. No pericardial effusion. Mediastinum/Nodes: Midline trachea. No suspicious lymph nodes. Hypodense nodule right lobe of thyroid measuring 1.7 cm. Lungs/Pleura: Lungs are clear. No pleural effusion or pneumothorax. Upper Abdomen: No acute abnormality. Musculoskeletal: No chest wall abnormality. No acute or significant osseous findings. Review of the MIP images confirms the above findings. IMPRESSION: 1. Negative for acute pulmonary embolus. 2. 1.7 cm hypodense nodule right lobe of thyroid. Recommend thyroid US (ref: J Am Coll Radiol. 2015 Feb;12(2): 143-50).This may be performed on a nonemergent basis. Aortic Atherosclerosis (ICD10-I70.0). Electronically Signed   By: Donavan Foil M.D.   On: 10/04/2021 23:20   DG Chest Portable 1 View  Result Date: 10/04/2021 CLINICAL DATA:  Possible sepsis EXAM: PORTABLE CHEST 1 VIEW COMPARISON:  04/27/2021 FINDINGS: The heart size and mediastinal contours are within normal limits. Both lungs are clear. The visualized skeletal structures are unremarkable. IMPRESSION: No active disease. Electronically Signed   By: Inez Catalina M.D.   On: 10/04/2021 20:32   EEG adult  Result Date: 10/05/2021 Greta Doom, MD  10/05/2021  3:22 PM History: 46 year old female being evaluated for altered mental status Sedation: None Technique: This EEG was acquired with electrodes placed according to the International 10-20 electrode system (including Fp1, Fp2, F3, F4, C3, C4, P3, P4, O1, O2, T3, T4, T5, T6, A1, A2, Fz, Cz, Pz). The following electrodes were missing or displaced: none. Background: The background consists predominantly of intermixed alpha and beta activities.  There is a well defined posterior dominant rhythm of eight hz that  attenuates with eye opening.  There is an increase in slow activity associated with drowsiness.  Sleep is recorded with symmetric appearing structures. Photic stimulation: Physiologic driving is present EEG Abnormalities: None Clinical Interpretation: This normal EEG was recorded in the awake and asleep state. There was no seizure or seizure predisposition recorded on this study. Please note that lack of epileptiform activity on EEG does not preclude the possibility of epilepsy. Roland Rack, MD [facility] [phone] If 7pm- 7am, please page neurology on call as listed in [place].   ECHOCARDIOGRAM COMPLETE  Result Date: 10/06/2021    ECHOCARDIOGRAM REPORT   Patient Name:   Karen [name] Date of Exam: 10/05/2021 Medical Rec #:  [id number]         Height:       63.0 in Accession #:    [id number]        Weight:       134.0 lb Date of Birth:  04-22-1976         BSA:          1.631 m Patient Age:    26 years          BP:           133/96 mmHg Patient Gender: F                 HR:           76 bpm. Exam Location:  [facility] Procedure: 2D Echo, Cardiac Doppler and Color Doppler Indications:     Elevated Troponin  History:         Patient has prior history of Echocardiogram examinations, most                  recent 01/02/2018. Risk Factors:Sleep Apnea. Ehlers-Danlos                  Syndrome.  Sonographer:     Cresenciano Lick RDCS Referring Phys:  [place] Diagnosing Phys: Nelva Bush MD IMPRESSIONS  1. Left ventricular ejection fraction, by estimation, is 60 to 65%. The left ventricle has normal function. The left ventricle has no regional wall motion abnormalities. Left ventricular diastolic parameters are consistent with Grade I diastolic dysfunction (impaired relaxation).  2. Right ventricular systolic function is normal. The right ventricular size is normal.  3. The mitral valve is grossly normal. Trivial mitral valve regurgitation. No evidence of mitral stenosis.  4. The  aortic valve has an indeterminant number of cusps. Aortic valve regurgitation is not visualized. No aortic stenosis is present.  5. The inferior vena cava is normal in size with <50% respiratory variability, suggesting right atrial pressure of 8 mmHg. FINDINGS  Left Ventricle: Left ventricular ejection fraction, by estimation, is 60 to 65%. The left ventricle has normal function. The left ventricle has no regional wall motion abnormalities. The left ventricular internal cavity size was normal in size. There is  borderline left ventricular hypertrophy. Left ventricular diastolic parameters are consistent with Grade I diastolic  dysfunction (impaired relaxation). Right Ventricle: The right ventricular size is normal. No increase in right ventricular wall thickness. Right ventricular systolic function is normal. Left Atrium: Left atrial size was normal in size. Right Atrium: Right atrial size was normal in size. Pericardium: There is no evidence of pericardial effusion. Mitral Valve: The mitral valve is grossly normal. Trivial mitral valve regurgitation. No evidence of mitral valve stenosis. Tricuspid Valve: The tricuspid valve is normal in structure. Tricuspid valve regurgitation is trivial. Aortic Valve: The aortic valve has an indeterminant number of cusps. Aortic valve regurgitation is not visualized. No aortic stenosis is present. Pulmonic Valve: The pulmonic valve was not well visualized. Pulmonic valve regurgitation is not visualized. No evidence of pulmonic stenosis. Aorta: The aortic root, ascending aorta and aortic arch are all structurally normal, with no evidence of dilitation or obstruction. Pulmonary Artery: The pulmonary artery is not well seen. Venous: The inferior vena cava is normal in size with less than 50% respiratory variability, suggesting right atrial pressure of 8 mmHg. IAS/Shunts: No atrial level shunt detected by color flow Doppler.  LEFT VENTRICLE PLAX 2D LVIDd:         3.20 cm   Diastology  LVIDs:         1.90 cm   LV e' medial:    6.96 cm/s LV PW:         1.00 cm   LV E/e' medial:  8.6 LV IVS:        1.00 cm   LV e' lateral:   7.94 cm/s LVOT diam:     1.60 cm   LV E/e' lateral: 7.5 LV SV:         44 LV SV Index:   27 LVOT Area:     2.01 cm  RIGHT VENTRICLE             IVC RV Basal diam:  2.00 cm     IVC diam: 1.30 cm RV S prime:     13.40 cm/s TAPSE (M-mode): 1.9 cm LEFT ATRIUM             Index        RIGHT ATRIUM          Index LA diam:        3.20 cm 1.96 cm/m   RA Area:     7.51 cm LA Vol (A2C):   17.1 ml 10.48 ml/m  RA Volume:   14.50 ml 8.89 ml/m LA Vol (A4C):   15.8 ml 9.69 ml/m LA Biplane Vol: 17.0 ml 10.42 ml/m  AORTIC VALVE LVOT Vmax:   106.00 cm/s LVOT Vmean:  81.100 cm/s LVOT VTI:    0.218 m  AORTA Ao Root diam: 3.40 cm Ao Asc diam:  3.00 cm MITRAL VALVE MV Area (PHT): 3.17 cm    SHUNTS MV Decel Time: 239 msec    Systemic VTI:  0.22 m MV E velocity: 59.70 cm/s  Systemic Diam: 1.60 cm MV A velocity: 80.50 cm/s MV E/A ratio:  0.74 Harrell Gave End MD Electronically signed by Nelva Bush MD Signature Date/Time: 10/06/2021/7:40:52 AM    Final    US THYROID  Result Date: 10/05/2021 CLINICAL DATA:  Goiter. EXAM: THYROID ULTRASOUND TECHNIQUE: Ultrasound examination of the thyroid gland and adjacent soft tissues was performed. COMPARISON:  None. FINDINGS: Parenchymal Echotexture: Markedly heterogenous Isthmus: 0.3 cm Right lobe: 4.6 x 1.8 x 2.1 cm Left lobe: 5.0 x 1.3 x 2.0 cm _________________________________________________________ Estimated total number of nodules >/= 1 cm: 1 Number  of spongiform nodules >/=  2 cm not described below (TR1): 0 Number of mixed cystic and solid nodules >/= 1.5 cm not described below ([place]): 0 _________________________________________________________ Nodule # 1: Location: Right; Inferior Maximum size: 1.8 cm; Other 2 dimensions: 1.5 x 1.5 cm Composition: solid/almost completely solid (2) Echogenicity: isoechoic (1) Shape: not taller-than-wide (0)  Margins: ill-defined (0) Echogenic foci: none (0) ACR TI-RADS total points: 3. ACR TI-RADS risk category: TR3 (3 points). ACR TI-RADS recommendations: *Given size (>/= 1.5 - 2.4 cm) and appearance, a follow-up ultrasound in 1 year should be considered based on TI-RADS criteria. _________________________________________________________ IMPRESSION: 1. Diffusely heterogeneous and enlarged thyroid gland most consistent with diffuse goitrous change. 2. Questionable nodule versus pseudo nodule in the right inferior gland. This most likely represents an approximately 1.8 cm TI-RADS category 3 nodule which meets consensus criteria for imaging surveillance. Recommend follow-up ultrasound in 1 year. The above is in keeping with the ACR TI-RADS recommendations - J Am Coll Radiol 2017;14:587-595. Electronically Signed   By: Jacqulynn Cadet M.D.   On: 10/05/2021 09:37    EKG: Normal sinus rhythm rate of 80  ASSESSMENT AND PLAN:  Preop ECT Catatonic Nonresponsive Ehlers-Danlos syndrome Fibromyalgia Sleep apnea Altered mental status Murmur  Plan Patient appears to be an acceptable risk for ECT Continue current medical therapy Echocardiogram suggest murmur is benign Agree with antidepressants Continue antipsychotics to help with catatonia Pain control as necessary for fibromyalgia No direct cardiac intervention necessary at this stage    Signed: Yolonda Kida MD, 10/07/2021, 7:56 AM

## 2021-10-07 NOTE — Progress Notes (Signed)
[facility] [place] NAME: Karen [name]    MR#:  [id number]  DATE OF BIRTH:  17-Feb-1976  SUBJECTIVE:   patient was brought in by EMS with her being unresponsive, not eating, not communicating No family at bedside. Patient eyes closed. No communication. Getting IV fluids.   VITALS:  Blood pressure 120/82, pulse 79, temperature 98.2 F (36.8 C), resp. rate 16, height 5\' 3"  (1.6 m), weight 60.8 kg, SpO2 99 %.  PHYSICAL EXAMINATION:   GENERAL:  46 y.o.-year-old patient lying in the bed with no acute distress. Poor hygiene LUNGS: Normal breath sounds bilaterally, no wheezing, rales, rhonchi.  CARDIOVASCULAR: S1, S2 normal. NEUROLOGIC: patient is asleep limited exam due to patient participation LABORATORY PANEL:  CBC Recent Labs  Lab 10/06/21 0459  WBC 9.4  HGB 12.0  HCT 36.6  PLT 269     Chemistries  Recent Labs  Lab 10/04/21 1901 10/05/21 0030 10/07/21 0432 10/07/21 1116  NA 145   < > 139  --   K 3.3*   < > 4.0  --   CL 107   < > 106  --   CO2 28   < > 23  --   GLUCOSE 141*   < > 73  --   BUN 32*   < > 10  --   CREATININE 0.83   < > 0.63  --   CALCIUM 8.6*   < > 8.8*  --   MG  --    < >  --  1.9  AST 41  --   --   --   ALT 30  --   --   --   ALKPHOS 94  --   --   --   BILITOT 0.6  --   --   --    < > = values in this interval not displayed.    Cardiac Enzymes No results for input(s): TROPONINI in the last 168 hours. RADIOLOGY:  DG Abd 1 View  Result Date: 10/07/2021 CLINICAL DATA:  Encounter for nasogastric tube placement EXAM: ABDOMEN - 1 VIEW COMPARISON:  Portable exam 1043 hours compared to 11/21/2012 FINDINGS: Tip of feeding tube projects over proximal stomach. Normal bowel gas pattern. Lung bases clear. No acute osseous findings. IMPRESSION: Tip of feeding tube projects over proximal stomach. Electronically Signed   By: Lavonia Dana M.D.   On: 10/07/2021 11:05   EEG adult  Result Date:  10/05/2021 Greta Doom, MD     10/05/2021  3:22 PM History: 46 year old female being evaluated for altered mental status Sedation: None Technique: This EEG was acquired with electrodes placed according to the International 10-20 electrode system (including Fp1, Fp2, F3, F4, C3, C4, P3, P4, O1, O2, T3, T4, T5, T6, A1, A2, Fz, Cz, Pz). The following electrodes were missing or displaced: none. Background: The background consists predominantly of intermixed alpha and beta activities.  There is a well defined posterior dominant rhythm of eight hz that attenuates with eye opening.  There is an increase in slow activity associated with drowsiness.  Sleep is recorded with symmetric appearing structures. Photic stimulation: Physiologic driving is present EEG Abnormalities: None Clinical Interpretation: This normal EEG was recorded in the awake and asleep state. There was no seizure or seizure predisposition recorded on this study. Please note that lack of epileptiform activity on EEG does not preclude the possibility of epilepsy. Roland Rack, MD [facility] [phone] If 7pm- 7am, please  page neurology on call as listed in [place].   ECHOCARDIOGRAM COMPLETE  Result Date: 10/06/2021    ECHOCARDIOGRAM REPORT   Patient Name:   Karen [name] Date of Exam: 10/05/2021 Medical Rec #:  [id number]         Height:       63.0 in Accession #:    [id number]        Weight:       134.0 lb Date of Birth:  1975/08/29         BSA:          1.631 m Patient Age:    74 years          BP:           133/96 mmHg Patient Gender: F                 HR:           76 bpm. Exam Location:  [facility] Procedure: 2D Echo, Cardiac Doppler and Color Doppler Indications:     Elevated Troponin  History:         Patient has prior history of Echocardiogram examinations, most                  recent 01/02/2018. Risk Factors:Sleep Apnea. Ehlers-Danlos                  Syndrome.  Sonographer:     Cresenciano Lick RDCS Referring  Phys:  [place] Diagnosing Phys: Nelva Bush MD IMPRESSIONS  1. Left ventricular ejection fraction, by estimation, is 60 to 65%. The left ventricle has normal function. The left ventricle has no regional wall motion abnormalities. Left ventricular diastolic parameters are consistent with Grade I diastolic dysfunction (impaired relaxation).  2. Right ventricular systolic function is normal. The right ventricular size is normal.  3. The mitral valve is grossly normal. Trivial mitral valve regurgitation. No evidence of mitral stenosis.  4. The aortic valve has an indeterminant number of cusps. Aortic valve regurgitation is not visualized. No aortic stenosis is present.  5. The inferior vena cava is normal in size with <50% respiratory variability, suggesting right atrial pressure of 8 mmHg. FINDINGS  Left Ventricle: Left ventricular ejection fraction, by estimation, is 60 to 65%. The left ventricle has normal function. The left ventricle has no regional wall motion abnormalities. The left ventricular internal cavity size was normal in size. There is  borderline left ventricular hypertrophy. Left ventricular diastolic parameters are consistent with Grade I diastolic dysfunction (impaired relaxation). Right Ventricle: The right ventricular size is normal. No increase in right ventricular wall thickness. Right ventricular systolic function is normal. Left Atrium: Left atrial size was normal in size. Right Atrium: Right atrial size was normal in size. Pericardium: There is no evidence of pericardial effusion. Mitral Valve: The mitral valve is grossly normal. Trivial mitral valve regurgitation. No evidence of mitral valve stenosis. Tricuspid Valve: The tricuspid valve is normal in structure. Tricuspid valve regurgitation is trivial. Aortic Valve: The aortic valve has an indeterminant number of cusps. Aortic valve regurgitation is not visualized. No aortic stenosis is present. Pulmonic Valve: The pulmonic  valve was not well visualized. Pulmonic valve regurgitation is not visualized. No evidence of pulmonic stenosis. Aorta: The aortic root, ascending aorta and aortic arch are all structurally normal, with no evidence of dilitation or obstruction. Pulmonary Artery: The pulmonary artery is not well seen. Venous: The inferior vena cava is normal in size with less  than 50% respiratory variability, suggesting right atrial pressure of 8 mmHg. IAS/Shunts: No atrial level shunt detected by color flow Doppler.  LEFT VENTRICLE PLAX 2D LVIDd:         3.20 cm   Diastology LVIDs:         1.90 cm   LV e' medial:    6.96 cm/s LV PW:         1.00 cm   LV E/e' medial:  8.6 LV IVS:        1.00 cm   LV e' lateral:   7.94 cm/s LVOT diam:     1.60 cm   LV E/e' lateral: 7.5 LV SV:         44 LV SV Index:   27 LVOT Area:     2.01 cm  RIGHT VENTRICLE             IVC RV Basal diam:  2.00 cm     IVC diam: 1.30 cm RV S prime:     13.40 cm/s TAPSE (M-mode): 1.9 cm LEFT ATRIUM             Index        RIGHT ATRIUM          Index LA diam:        3.20 cm 1.96 cm/m   RA Area:     7.51 cm LA Vol (A2C):   17.1 ml 10.48 ml/m  RA Volume:   14.50 ml 8.89 ml/m LA Vol (A4C):   15.8 ml 9.69 ml/m LA Biplane Vol: 17.0 ml 10.42 ml/m  AORTIC VALVE LVOT Vmax:   106.00 cm/s LVOT Vmean:  81.100 cm/s LVOT VTI:    0.218 m  AORTA Ao Root diam: 3.40 cm Ao Asc diam:  3.00 cm MITRAL VALVE MV Area (PHT): 3.17 cm    SHUNTS MV Decel Time: 239 msec    Systemic VTI:  0.22 m MV E velocity: 59.70 cm/s  Systemic Diam: 1.60 cm MV A velocity: 80.50 cm/s MV E/A ratio:  0.74 Christopher End MD Electronically signed by Nelva Bush MD Signature Date/Time: 10/06/2021/7:40:52 AM    Final     Assessment and Plan KAYONA FOOR is a 46 y.o. female with medical history significant of altered mental status, takes, psychosis, Ehlers-Danlos syndrome, fibromyalgia presenting by EMS for lying on the floor for last several days and not moving Per ED providers note.  Patient  unable to provide history no family at bedside.  history of paranoid psychosis with hallucinations and delusions/catatonia medication noncompliance severe major depression Depression and anxiety: -- discussed with Dr. [name] --plans for ECT  -- cardiology clearance obtained from Dr. [name] for ECT  Sepsis secondary to UTI -Patient started on Rocephin x 3 days-- completed -- urine cultures pending -- blood cultures negative so far -- lactic acid levels 2.2 --received IVF per sepsis protocol --sepsis resolved   Elevated troponin w/o cardiac history -appears demand ischemia likely due to sepsis. --pt unable to give any history or ROS --troponin remains flat ---echo unremarkable -- seen by Dr. [name] cleared for ECT from cardiology stem    Failure to thrive/significant weight loss of more than 30 pounds per mother -- dietitian to see patient -- overall seems to have a poor long-term prognosis -- spoke with patient's mother Jorene Guest over the phone yesterday. Recommended dietitian recommendations and mother is okay with placing NG tube and starting nutrition.    Thyroid nodule: - thyroid ultrasound Diffusely heterogeneous and enlarged thyroid  gland most consistent with diffuse goitrous change. 2. Questionable nodule versus pseudo nodule in the right inferior gland. This most likely represents an approximately 1.8 cm TI-RADS category 3 nodule which meets consensus criteria for imaging surveillance. Recommend follow-up ultrasound in 1 year.  -- Patient will follow-up with PCP   palliative care discussed with mother at length. Patient is full code. Mother wants full scope of treatment.   patient overall has a very poor prognosis.     Procedures: Family communication : none Consults : psychiatry, cardiology palliative care CODE STATUS: full DVT Prophylaxis : heparin Level of care: Med-Surg Status is: Inpatient Remains inpatient appropriate because: failure to thrive,  UTI, paranoia/?catatonia            TOTAL TIME TAKING CARE OF THIS PATIENT: 25 minutes.  >50% time spent on counselling and coordination of care  Note: This dictation was prepared with Dragon dictation along with smaller phrase technology. Any transcriptional errors that result from this process are unintentional.  Fritzi Mandes M.D    [facility]   CC: Primary care physician; Birdie Sons, MD

## 2021-10-07 NOTE — Telephone Encounter (Signed)
Pt mother called stated she called ans spoke to triage and the nurse took things out of context.  Now police and social workers are involved and she doesn't know if she need to get a Chief Executive Officer but someone is going to be paying for this.  Wants to know who the triage nurse is who she spoke with.  States police said she slammed daughters head on the floor to make her go unconscious and that is not what happened.  Told patient I would investigate the call as all calls are recorded.  Asked what number she called from.  She said she couldn't remember but was going to give me two numbers one was from a phone that no longer works (it is fried per the patient) and the other is the phone she took from the patient because she was calling "them" all the time.    Patient states her daughter has never been diagnosed with schizophrenia but was by the time the police arrived.    Requesting Dr. Caryn Section call her back to know what happened to his patient that has been seeing him for 20 plus years.

## 2021-10-07 NOTE — Progress Notes (Signed)
[facility] MD Progress Note  10/07/2021 5:54 PM Karen [name]  MRN:  [id number] Subjective: Follow-up 46 year old woman with catatonic presentation of what is probably a psychotic depression.  Came to see patient a couple different times today both times she was unresponsive not moving not responding at all to verbal or manual stimulation.  2 attempts were made to place nasogastric feeding tubes today.  On both occasions apparently the tube was placed but the patient then pulled it out.  Some documentation that she was communicating first thing in the morning but does not sound like she did any more after that.  I came by at the end of the day with a plan to have the mother sign consent for electroconvulsive therapy which I had thought we had agreed on yesterday but the mother is not currently present Principal Problem: Catatonia Diagnosis: Principal Problem:   Catatonia Active Problems:   Depression with anxiety   Seizure disorder ([place])   Sepsis secondary to UTI ([place])   AMS (altered mental status)   Thyroid nodule   Hypokalemia   Elevated troponin   Depression, major, recurrent, severe with psychosis ([place])   Malnutrition of moderate degree  Total Time spent with patient: 30 minutes  Past Psychiatric History: Past history of what sounds like a precipitous decline with psychotic symptoms since last summer  Past Medical History:  Past Medical History:  Diagnosis Date   Ehlers-Danlos syndrome    Fibromyalgia    Headache    Sleep apnea    Tics of organic origin    Transient alteration of awareness     Past Surgical History:  Procedure Laterality Date   COLONOSCOPY WITH PROPOFOL N/A 02/13/2018   Procedure: COLONOSCOPY WITH PROPOFOL;  Surgeon: [name], MD;  Location: [facility] ENDOSCOPY;  Service: Endoscopy;  Laterality: N/A;   DILATION AND CURETTAGE OF UTERUS  2009   ESOPHAGOGASTRODUODENOSCOPY (EGD) WITH PROPOFOL  02/13/2018   Procedure: ESOPHAGOGASTRODUODENOSCOPY (EGD) WITH PROPOFOL;   Surgeon: [name], MD;  Location: [facility] ENDOSCOPY;  Service: Endoscopy;;   EYE SURGERY Left 1990   3 Surgeries on left eye to correct crossed eye   LAPAROSCOPY Left 2003   Fallopian Tube   [place]   3 Surgeries to repair broken arm   OTHER SURGICAL HISTORY Left    3 surgeries on her left thigh as an infant   OTHER SURGICAL HISTORY  1998   Jaw surgery   Right arm surgery  1987   x 3 due to fracture   TOENAIL EXCISION Bilateral 06/13/2019   Procedure: BILATERAL SECOND TOE PARTIAL NAIL ABLATION;  Surgeon: Erle Crocker, MD;  Location: [place];  Service: Orthopedics;  Laterality: Bilateral;  SURGERY REQUEST TIME 1 HOUR   TONSILLECTOMY  12/13/2002   Family History:  Family History  Problem Relation Age of Onset   Depression Mother    Stroke Mother    Fibromyalgia Mother    Osteoarthritis Mother    Asthma Brother    Depression Brother    Migraines Brother    Asperger's syndrome Brother    Other Brother        Ehlers-Danlos Syndrome   Cancer Maternal Aunt    Asperger's syndrome Maternal Aunt    Breast cancer Maternal Aunt    Heart disease Paternal Aunt    Heart disease Paternal Uncle    Cervical cancer Maternal Grandmother    Osteoporosis Maternal Grandmother  Died at 40   Osteoarthritis Maternal Grandmother    Colon cancer Maternal Grandfather    Asthma Maternal Grandfather    Asperger's syndrome Maternal Grandfather    Emphysema Maternal Grandfather        Died at 15   Prostate cancer Maternal Grandfather    Heart attack Paternal Grandfather        Died at 35   Asthma Paternal Grandfather    Tuberculosis Paternal Grandfather    Cancer Cousin    Asperger's syndrome Cousin    Asperger's syndrome Other    Heart Problems Paternal Grandmother        Died at 75   Ehlers-Danlos syndrome Father    Ehlers-Danlos syndrome Brother    Family Psychiatric  History: See previous Social History:   Social History   Substance and Sexual Activity  Alcohol Use No   Alcohol/week: 0.0 standard drinks     Social History   Substance and Sexual Activity  Drug Use No    Social History   Socioeconomic History   Marital status: Single    Spouse name: Not on file   Number of children: Not on file   Years of education: Not on file   Highest education level: Not on file  Occupational History   Not on file  Tobacco Use   Smoking status: Never   Smokeless tobacco: Never  Vaping Use   Vaping Use: Never used  Substance and Sexual Activity   Alcohol use: No    Alcohol/week: 0.0 standard drinks   Drug use: No   Sexual activity: Never  Other Topics Concern   Not on file  Social History Narrative   Eufemia is 2.   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    She lives with her mother.    She enjoys reading, watching TV, writing, and painting.    Social Determinants of Health   Financial Resource Strain: Not on file  Food Insecurity: Not on file  Transportation Needs: Not on file  Physical Activity: Not on file  Stress: Not on file  Social Connections: Not on file   Additional Social History:                         Sleep: Poor  Appetite:  Poor  Current Medications: Current Facility-Administered Medications  Medication Dose Route Frequency Provider Last Rate Last Admin   0.9 % NaCl with KCl 40 mEq / L  infusion   Intravenous Continuous Fritzi Mandes, MD 75 mL/hr at 10/07/21 0427 Infusion Verify at 10/07/21 0427   acetaminophen (TYLENOL) tablet 650 mg  650 mg Oral Q6H PRN Para Skeans, MD       Or   acetaminophen (TYLENOL) suppository 650 mg  650 mg Rectal Q6H PRN Para Skeans, MD       albuterol (PROVENTIL) (2.5 MG/3ML) 0.083% nebulizer solution 2.5 mg  2.5 mg Nebulization Q6H PRN Renda Rolls, RPH       heparin injection 5,000 Units  5,000 Units Subcutaneous Q8H Florina Ou V, MD   5,000 Units at 10/07/21 1256   pantoprazole (PROTONIX) injection 40 mg  40 mg  Intravenous Q12H Florina Ou V, MD   40 mg at 10/07/21 1038   sodium chloride flush (NS) 0.9 % injection 3 mL  3 mL Intravenous Q12H Para Skeans, MD   3 mL at 10/06/21 0813    Lab Results:  Results for orders placed or performed during the  hospital encounter of 10/04/21 (from the past 48 hour(s))  Basic metabolic panel     Status: Abnormal   Collection Time: 10/06/21  4:59 AM  Result Value Ref Range   Sodium 140 135 - 145 mmol/L   Potassium 3.3 (L) 3.5 - 5.1 mmol/L   Chloride 108 98 - 111 mmol/L   CO2 25 22 - 32 mmol/L   Glucose, Bld 89 70 - 99 mg/dL    Comment: Glucose reference range applies only to samples taken after fasting for at least 8 hours.   BUN 11 6 - 20 mg/dL   Creatinine, Ser 0.56 0.44 - 1.00 mg/dL   Calcium 8.5 (L) 8.9 - 10.3 mg/dL   GFR, Estimated >60 >60 mL/min    Comment: (NOTE) Calculated using the CKD-EPI Creatinine Equation (2021)    Anion gap 7 5 - 15    Comment: Performed at [facility], [address] [place], [place] [address]  CBC     Status: None   Collection Time: 10/06/21  4:59 AM  Result Value Ref Range   WBC 9.4 4.0 - 10.5 K/uL   RBC 4.35 3.87 - 5.11 MIL/uL   Hemoglobin 12.0 12.0 - 15.0 g/dL    Comment: REPEATED TO VERIFY   HCT 36.6 36.0 - 46.0 %   MCV 84.1 80.0 - 100.0 fL   MCH 27.6 26.0 - 34.0 pg   MCHC 32.8 30.0 - 36.0 g/dL   RDW 15.4 11.5 - 15.5 %   Platelets 269 150 - 400 K/uL   nRBC 0.0 0.0 - 0.2 %    Comment: Performed at [facility], [address] [place], [place] [address]  Potassium     Status: None   Collection Time: 10/06/21  5:43 PM  Result Value Ref Range   Potassium 4.1 3.5 - 5.1 mmol/L    Comment: Performed at [facility], [address], [place], [place] [address]  Basic metabolic panel     Status: Abnormal   Collection Time: 10/07/21  4:32 AM  Result Value Ref Range   Sodium 139 135 - 145 mmol/L   Potassium 4.0 3.5 - 5.1 mmol/L   Chloride 106 98 - 111 mmol/L   CO2 23 22 - 32 mmol/L    Glucose, Bld 73 70 - 99 mg/dL    Comment: Glucose reference range applies only to samples taken after fasting for at least 8 hours.   BUN 10 6 - 20 mg/dL   Creatinine, Ser 0.63 0.44 - 1.00 mg/dL   Calcium 8.8 (L) 8.9 - 10.3 mg/dL   GFR, Estimated >60 >60 mL/min    Comment: (NOTE) Calculated using the CKD-EPI Creatinine Equation (2021)    Anion gap 10 5 - 15    Comment: Performed at [facility], [name] [place], [place] [address]  Magnesium     Status: None   Collection Time: 10/07/21 11:16 AM  Result Value Ref Range   Magnesium 1.9 1.7 - 2.4 mg/dL    Comment: Performed at [facility], [address], [place], [place] [address]  Phosphorus     Status: None   Collection Time: 10/07/21 11:16 AM  Result Value Ref Range   Phosphorus 3.7 2.5 - 4.6 mg/dL    Comment: Performed at [facility], [address] [place], [place] [address]  Glucose, capillary     Status: None   Collection Time: 10/07/21 12:21 PM  Result Value Ref Range   Glucose-Capillary 82 70 -  99 mg/dL    Comment: Glucose reference range applies only to samples taken after fasting for at least 8 hours.    Blood Alcohol level:  Lab Results  Component Value Date   ETH <10 10/04/2021   ETH <10 16/05/9603    Metabolic Disorder Labs: No results found for: HGBA1C, MPG No results found for: PROLACTIN Lab Results  Component Value Date   CHOL 189 10/15/2019   TRIG 57 10/15/2019   HDL 70 10/15/2019   LDLCALC 108 (H) 10/15/2019    Physical Findings: AIMS:  , ,  ,  ,    CIWA:    COWS:     Musculoskeletal: Strength & Muscle Tone: decreased Gait & Station: unable to stand Patient leans: N/A  Psychiatric Specialty Exam:  Presentation  General Appearance: Disheveled  Eye Contact:Fair  Speech:Normal Rate; Clear and Coherent  Speech Volume:Normal  Handedness:Right   Mood and Affect  Mood:Anxious  Affect:Congruent   Thought Process  Thought  Processes:Disorganized  Descriptions of Associations:Tangential  Orientation:Full (Time, Place and Person)  Thought Content:Tangential  History of Schizophrenia/Schizoaffective disorder:No  Duration of Psychotic Symptoms:Greater than six months (Since August 2022)  Hallucinations:No data recorded Ideas of Reference:Paranoia  Suicidal Thoughts:No data recorded Homicidal Thoughts:No data recorded  Sensorium  Memory:Immediate [place]   Executive Functions  Concentration:Fair  Attention Span:Fair  [place] of Knowledge:Good  Language:Good   Psychomotor Activity  Psychomotor Activity:No data recorded  Assets  Assets:Communication Skills; Leisure Time; Social Support; Resilience   Sleep  Sleep:No data recorded   Physical Exam: Physical Exam Vitals and nursing note reviewed.  Constitutional:      Appearance: She is ill-appearing.  HENT:     Head: Normocephalic and atraumatic.  Cardiovascular:     Rate and Rhythm: Normal rate and regular rhythm.  Pulmonary:     Effort: Pulmonary effort is normal.     Breath sounds: Normal breath sounds.  Abdominal:     General: Abdomen is flat.     Palpations: Abdomen is soft.  Musculoskeletal:        General: Normal range of motion.  Skin:    General: Skin is warm and dry.  Neurological:     General: No focal deficit present.     Mental Status: Mental status is at baseline.  Psychiatric:        Attention and Perception: She is inattentive.        Mood and Affect: Affect is blunt.        Speech: She is noncommunicative.   Review of Systems  Unable to perform ROS: Psychiatric disorder  Blood pressure 115/82, pulse 85, temperature 98.4 F (36.9 C), temperature source Oral, resp. rate 16, height 5\' 3"  (1.6 m), weight 60.8 kg, SpO2 100 %. Body mass index is 23.74 kg/m.   Treatment Plan Summary: Plan patient continues to appear catatonic.  The fact that she pulled out her  tubes today is not inconsistent with catatonia in which patients can have waxing and waning behaviors.  Patient remains appropriate candidate for ECT which we are hoping to start tomorrow.  Patient is n.p.o. anyway but I will make sure there is an order in place.  I am hoping I can either see mom in person or get telephone consent tomorrow morning  Alethia Berthold, MD 10/07/2021, 5:54 PM

## 2021-10-07 NOTE — Telephone Encounter (Signed)
[facility] referring for Chronic Pelvic Pain. Called and left voicemail for patient to call back to be scheduled. MD only

## 2021-10-07 NOTE — Consult Note (Signed)
PHARMACY CONSULT NOTE - FOLLOW UP  Pharmacy Consult for Electrolyte Monitoring and Replacement   Recent Labs: Potassium (mmol/L)  Date Value  10/07/2021 4.0  02/19/2014 3.9   Magnesium (mg/dL)  Date Value  10/07/2021 1.9   Calcium (mg/dL)  Date Value  10/07/2021 8.8 (L)   Calcium, Total (mg/dL)  Date Value  02/19/2014 9.0   Albumin (g/dL)  Date Value  10/04/2021 3.6  10/15/2019 4.5  02/19/2014 3.6   Phosphorus (mg/dL)  Date Value  10/07/2021 3.7   Sodium (mmol/L)  Date Value  10/07/2021 139  10/15/2019 140  02/19/2014 140     Assessment: Pharmacy has been consulted to monitor and replace electrolytes in 46yo patient admitted with sepsis secondary to UTI. Patient with significant weight loss of more than 30 pounds. Dietician also following.  IVF: NS+27mEq K@75ml /h  Goal of Therapy:  Electrolytes WNL  Plan:  --No supplementation currently needed --will recheck electrolytes with AM labs  Pearla Dubonnet ,PharmD Clinical Pharmacist 10/07/2021 1:51 PM

## 2021-10-07 NOTE — Progress Notes (Addendum)
Nutrition Follow-up  DOCUMENTATION CODES:   Non-severe (moderate) malnutrition in context of chronic illness  INTERVENTION:   -D/c TF due to no access -Mighty Shake TID with meals, each supplement provides 220 kcals and 6 grams   NUTRITION DIAGNOSIS:   Moderate Malnutrition related to chronic illness (psychiatric illness) as evidenced by mild fat depletion, moderate fat depletion, mild muscle depletion, moderate muscle depletion.  Ongoing  GOAL:   Patient will meet greater than or equal to 90% of their needs  Progressing   MONITOR:   PO intake, Supplement acceptance, Labs, Weight trends, Skin, I & O's  REASON FOR ASSESSMENT:   Consult Assessment of nutrition requirement/status  ASSESSMENT:   Karen [name] is a 46 y.o. female with medical history significant of altered mental status, takes, psychosis, Ehlers-Danlos syndrome, fibromyalgia presenting by EMS for lying on the floor for last several days and not moving Per ED providers note.  Patient unable to provide history no family at bedside.  Patient has a history of psychotic episodes in the past patient has been noted to be urinating and defecating on herself per ED note.  The admission requested for urinary tract infection.  Reviewed I/O's: -1.3 L x 24 hours and -1.2 L since admission  UOP: 1.7 L x 24 hours  Case discussed with RN and MD. Pt mother agreeable to feeding tube. RN placed feeding tube at bedside and is awaiting placement confirmation via x-ray.   Per neurology notes, plan for ECT treatments soon.   ADDENDUM: Pt pulled out NGT. Per MD, no plan to replace. Pt to start ECT treatments soon.   Medications reviewed and include 0.9% NaCl with KCl 40 mEq/L infusion @ 75 ml/hr.   Labs reviewed.   Diet Order:   Diet Order             DIET DYS 3 Room service appropriate? Yes; Fluid consistency: Thin  Diet effective now                   EDUCATION NEEDS:   Not appropriate for education at this  time  Skin:  Skin Assessment: Reviewed RN Assessment  Last BM:  Unknown  Height:   Ht Readings from Last 1 Encounters:  10/04/21 5\' 3"  (1.6 m)    Weight:   Wt Readings from Last 1 Encounters:  10/04/21 60.8 kg    Ideal Body Weight:  52.3 kg  BMI:  Body mass index is 23.74 kg/m.  Estimated Nutritional Needs:   Kcal:  1800-2000  Protein:  90-105 grams  Fluid:  > 1.8 L    Loistine Chance, RD, LDN, [place] Registered Dietitian II Certified Diabetes Care and Education Specialist Please refer to [facility] for RD and/or RD on-call/weekend/after hours pager

## 2021-10-08 ENCOUNTER — Inpatient Hospital Stay: Payer: Medicare Other | Admitting: Certified Registered Nurse Anesthetist

## 2021-10-08 DIAGNOSIS — F061 Catatonic disorder due to known physiological condition: Secondary | ICD-10-CM | POA: Diagnosis not present

## 2021-10-08 LAB — BASIC METABOLIC PANEL
Anion gap: 13 (ref 5–15)
BUN: 13 mg/dL (ref 6–20)
CO2: 23 mmol/L (ref 22–32)
Calcium: 9 mg/dL (ref 8.9–10.3)
Chloride: 101 mmol/L (ref 98–111)
Creatinine, Ser: 0.67 mg/dL (ref 0.44–1.00)
GFR, Estimated: >60 mL/min (ref >60)
Glucose, Bld: 54 mg/dL — ABNORMAL LOW (ref 70–99)
Potassium: 4.2 mmol/L (ref 3.5–5.1)
Sodium: 137 mmol/L (ref 135–145)

## 2021-10-08 LAB — URINE CULTURE: Culture: 80000 — AB

## 2021-10-08 LAB — GLUCOSE, CAPILLARY
Glucose-Capillary: 62 mg/dL — ABNORMAL LOW (ref 70–99)
Glucose-Capillary: 131 mg/dL — ABNORMAL HIGH (ref 70–99)
Glucose-Capillary: 94 mg/dL (ref 70–99)

## 2021-10-08 MED ORDER — DEXTROSE-NACL 5-0.9 % IV SOLN: INTRAVENOUS | Status: DC

## 2021-10-08 MED ORDER — DEXTROSE 50 % IV SOLN
25.0000 mL | Freq: Once | INTRAVENOUS | Status: AC
  Administered 2021-10-08: 25 mL via INTRAVENOUS

## 2021-10-08 MED ORDER — DEXTROSE 50 % IV SOLN
INTRAVENOUS | Status: AC
  Filled 2021-10-08: qty 50

## 2021-10-08 NOTE — Progress Notes (Signed)
[facility] [place] NAME: Karen [name]    MR#:  [id number]  DATE OF BIRTH:  Sep 01, 1975  SUBJECTIVE:   No family at bedside. Patient eyes closed. No communication. Getting IV fluids. Pulled 2 NG's out yday   VITALS:  Blood pressure 102/76, pulse 78, temperature 98.2 F (36.8 C), resp. rate 17, height 5\' 3"  (1.6 m), weight 60.8 kg, SpO2 97 %.  PHYSICAL EXAMINATION:   GENERAL:  46 y.o.-year-old patient lying in the bed with no acute distress. Poor hygiene LUNGS: Normal breath sounds bilaterally, no wheezing, rales, rhonchi.  CARDIOVASCULAR: S1, S2 normal. NEUROLOGIC: patient is asleep limited exam due to patient participation LABORATORY PANEL:  CBC Recent Labs  Lab 10/06/21 0459  WBC 9.4  HGB 12.0  HCT 36.6  PLT 269     Chemistries  Recent Labs  Lab 10/04/21 1901 10/05/21 0030 10/07/21 1116 10/08/21 0505  NA 145   < >  --  137  K 3.3*   < >  --  4.2  CL 107   < >  --  101  CO2 28   < >  --  23  GLUCOSE 141*   < >  --  54*  BUN 32*   < >  --  13  CREATININE 0.83   < >  --  0.67  CALCIUM 8.6*   < >  --  9.0  MG  --    < > 1.9  --   AST 41  --   --   --   ALT 30  --   --   --   ALKPHOS 94  --   --   --   BILITOT 0.6  --   --   --    < > = values in this interval not displayed.    Cardiac Enzymes No results for input(s): TROPONINI in the last 168 hours. RADIOLOGY:  DG Abd 1 View  Result Date: 10/07/2021 CLINICAL DATA:  Encounter for nasogastric tube placement EXAM: ABDOMEN - 1 VIEW COMPARISON:  Portable exam 1043 hours compared to 11/21/2012 FINDINGS: Tip of feeding tube projects over proximal stomach. Normal bowel gas pattern. Lung bases clear. No acute osseous findings. IMPRESSION: Tip of feeding tube projects over proximal stomach. Electronically Signed   By: Lavonia Dana M.D.   On: 10/07/2021 11:05    Assessment and Plan Karen [name] is a 46 y.o. female with medical history significant of altered  mental status, takes, psychosis, Ehlers-Danlos syndrome, fibromyalgia presenting by EMS for lying on the floor for last several days and not moving Per ED providers note.  Patient unable to provide history no family at bedside.  history of paranoid psychosis with hallucinations and delusions/catatonia medication noncompliance severe major depression Depression and anxiety: -- discussed with Dr. [name] --plans for ECT  -- cardiology clearance obtained from Dr. [name] for ECT --ECT per Psych when they are able to get consent from her mother.  Sepsis secondary to UTI -Patient started on Rocephin x 3 days-- completed -- urine cultures pending -- blood cultures negative so far -- lactic acid levels 2.2 --received IVF per sepsis protocol --sepsis resolved   Elevated troponin w/o cardiac history -appears demand ischemia likely due to sepsis. --pt unable to give any history or ROS --troponin remains flat ---echo unremarkable -- seen by Dr. [name] cleared for ECT from cardiology stem   Failure to thrive/significant weight loss of more than 30 pounds  per mother -- dietitian to see patient -- overall seems to have a poor long-term prognosis -- spoke with patient's mother Jorene Guest over the phone yesterday. Recommended dietitian recommendations and mother is okay with placing NG tube and starting nutrition. --2/23--pt pulled out NG tubes x 2 even with mitts on--will not be placing it.    Thyroid nodule: - thyroid ultrasound Diffusely heterogeneous and enlarged thyroid gland most consistent with diffuse goitrous change.  Recommend follow-up ultrasound in 1 year.  -- Patient will follow-up with PCP   palliative care discussed with mother at length. Patient is full code. Mother wants full scope of treatment.   patient overall has a very poor prognosis.     Procedures: Family communication  mother susana on 2/22 Consults : psychiatry, cardiology palliative care CODE STATUS:  full DVT Prophylaxis : heparin Level of care: Med-Surg Status is: Inpatient Remains inpatient appropriate because: failure to thrive, UTI, paranoia/?catatonia Psychiatry plans for ECT next week once consent obtained          TOTAL TIME TAKING CARE OF THIS PATIENT: 20 minutes.  >50% time spent on counselling and coordination of care  Note: This dictation was prepared with Dragon dictation along with smaller phrase technology. Any transcriptional errors that result from this process are unintentional.  Karen [name] M.D    [facility]   CC: Primary care physician; Birdie Sons, MD

## 2021-10-08 NOTE — Telephone Encounter (Signed)
Called and left voicemail for patient to call back to be scheduled. 

## 2021-10-08 NOTE — Consult Note (Signed)
PHARMACY CONSULT NOTE - FOLLOW UP  Pharmacy Consult for Electrolyte Monitoring and Replacement   Recent Labs: Potassium (mmol/L)  Date Value  10/08/2021 4.2  02/19/2014 3.9   Magnesium (mg/dL)  Date Value  10/07/2021 1.9   Calcium (mg/dL)  Date Value  10/08/2021 9.0   Calcium, Total (mg/dL)  Date Value  02/19/2014 9.0   Albumin (g/dL)  Date Value  10/04/2021 3.6  10/15/2019 4.5  02/19/2014 3.6   Phosphorus (mg/dL)  Date Value  10/07/2021 3.7   Sodium (mmol/L)  Date Value  10/08/2021 137  10/15/2019 140  02/19/2014 140     Assessment: Pharmacy has been consulted to monitor and replace electrolytes in 45yo patient admitted with sepsis secondary to UTI. She is currently in a catatonic state and per psychology, will start patient on ECT. Patient with significant weight loss of more than 30 pounds. Dietician also following.  IVF: NS+42mEq K@75ml /h>>stopped on 2/24  Goal of Therapy:  Electrolytes WNL  Plan:  --No supplementation currently needed --will recheck electrolytes with AM labs  Pearla Dubonnet ,PharmD Clinical Pharmacist 10/08/2021 9:32 AM

## 2021-10-08 NOTE — TOC Initial Note (Addendum)
Transition of Care [facility]) - Initial/Assessment Note    Patient Details  Name: Karen [name] MRN: [id number] Date of Birth: 1976-02-23  [facility] CM/SW Contact:    Pete Pelt, RN Phone Number: 10/08/2021, 3:11 PM  Clinical Narrative:    Patient lives at home with her mother.  Her mother cares for her at home, and is willing to take her back home if she is not incontinent and can give herself a bath.  Mother is 33 years of age and cannot physically take care of patient.  Mother would like home health if possible, states they have not been able to gain approval.  Care team made aware of mother's request.  Patient's mother states she transports patient to appointments, and other than a colonoscopy, she is up to date.  Patient's mother would also like help with medications management at home.  Care team made aware.  TOC contact information provided to mother, TOC to follow for needs.  Addendum 1520:  Mother contact number [phone]  Expected Discharge Plan:  (Home with parents as caregivers) Barriers to Discharge: Continued Medical Work up   Patient Goals and CMS Choice     Choice offered to / list presented to : NA  Expected Discharge Plan and Services Expected Discharge Plan:  (Home with parents as caregivers)   Discharge Planning Services: CM Consult   Living arrangements for the past 2 months: Single Family Home                                      Prior Living Arrangements/Services Living arrangements for the past 2 months: Single Family Home Lives with:: Self, Parents Patient language and need for interpreter reviewed:: Yes (Spoke to patient's mother, patient is catatonic) Do you feel safe going back to the place where you live?: Yes      Need for Family Participation in Patient Care: Yes (Comment) Care giver support system in place?: Yes (comment)   Criminal Activity/Legal Involvement Pertinent to Current Situation/Hospitalization: No  - Comment as needed  Activities of Daily Living Home Assistive Devices/Equipment: Wheelchair ADL Screening (condition at time of admission) Patient's cognitive ability adequate to safely complete daily activities?: No Is the patient deaf or have difficulty hearing?: No Does the patient have difficulty seeing, even when wearing glasses/contacts?: No Does the patient have difficulty concentrating, remembering, or making decisions?: Yes Patient able to express need for assistance with ADLs?: No Does the patient have difficulty dressing or bathing?: Yes Independently performs ADLs?: No Communication: Dependent Is this a change from baseline?: Pre-admission baseline Dressing (OT): Dependent Is this a change from baseline?: Pre-admission baseline Grooming: Dependent Is this a change from baseline?: Pre-admission baseline Feeding: Dependent Is this a change from baseline?: Pre-admission baseline Bathing: Dependent Is this a change from baseline?: Pre-admission baseline Toileting: Dependent Is this a change from baseline?: Pre-admission baseline In/Out Bed: Dependent Is this a change from baseline?: Pre-admission baseline Walks in Home: Dependent Is this a change from baseline?: Pre-admission baseline Does the patient have difficulty walking or climbing stairs?: Yes Weakness of Legs: Both Weakness of Arms/Hands: Both  Permission Sought/Granted Permission sought to share information with : Case Manager                Emotional Assessment         Alcohol / Substance Use: Not Applicable Psych Involvement: No (comment)  Admission diagnosis:  Thyroid nodule [E04.1] Elevated troponin [R77.8] Urinary tract infection without hematuria, site unspecified [N39.0] Altered mental status, unspecified altered mental status type [R41.82] AMS (altered mental status) [R41.82] Patient Active Problem List   Diagnosis Date Noted   Malnutrition of moderate degree 10/07/2021   AMS (altered  mental status) 10/05/2021   Thyroid nodule 10/05/2021   Hypokalemia 10/05/2021   Elevated troponin 10/05/2021   Catatonia 10/05/2021   Depression, major, recurrent, severe with psychosis ([place]) 10/05/2021   Polypharmacy 08/12/2021   Major depressive disorder, recurrent episode, moderate ([place]) 23/76/2831   Acute metabolic encephalopathy 51/76/1607   Asthma 04/22/2021   Paranoia (psychosis) ([place]) 04/22/2021   Psychosis ([place]) 04/22/2021   Sepsis secondary to UTI ([place]) 04/22/2021   Urinary retention 04/22/2021   Chronic tension type headache 06/04/2020   Breast lump on left side at 1 o'clock position 10/09/2019   Fibrocystic breast changes, bilateral 10/09/2019   Family history of colon cancer    Rectal polyp    Heartburn    Excessive somnolence disorder 06/28/2017   Difficulty hearing 08/31/2015   Back pain, chronic 08/31/2015   Rheumatoid arthritis ([place]) 08/31/2015   Alopecia 03/10/2015   Arthritis 03/10/2015   Allergic asthma, mild persistent, uncomplicated 37/05/6268   Chronic nausea 03/10/2015   DDD (degenerative disc disease), lumbar 03/10/2015   Depression with anxiety 03/10/2015   Personal history of MRSA (methicillin resistant Staphylococcus aureus) 03/10/2015   Insomnia 03/10/2015   Mitral valve prolapse 03/10/2015   Paresthesias/numbness 03/10/2015   Allergic rhinitis, seasonal 03/10/2015   Seizure disorder ([place]) 03/10/2015   Mixed sleep apnea, Central predominant 03/10/2015   Migraine without aura 01/27/2014   Fibromyalgia 01/27/2014   Transient alteration of awareness 01/07/2014   Tics of organic origin 01/07/2014   Backache, unspecified 01/07/2014   Ehlers-Danlos syndrome 01/07/2014   PCP:  Birdie Sons, MD Pharmacy:   [id number] [name], [place] [address] Phone: [phone] Fax: [phone]  [id number] [place] [place] [address] [place] [address] Phone:  [phone] Fax: [phone]  [id number] [place] [place] - [address] [place] [address] Phone: [phone] Fax: [phone]     Social Determinants of Health (SDOH) Interventions    Readmission Risk Interventions No flowsheet data found.

## 2021-10-08 NOTE — Consult Note (Signed)
ECT/Psych: I have attemped to reach mother by telephone 3 times this morning using both mobile and cell numbers with no answer except I have left VM messages. At this point not possible to proceed with ECT today. I have spoken with Dr. Posey Pronto and will leave message asking mother to be present in hospital Monday AM or available by phone at that time.   Patient remains largely unresponsive.

## 2021-10-09 DIAGNOSIS — F061 Catatonic disorder due to known physiological condition: Secondary | ICD-10-CM | POA: Diagnosis not present

## 2021-10-09 LAB — BASIC METABOLIC PANEL
Anion gap: 12 (ref 5–15)
BUN: 10 mg/dL (ref 6–20)
CO2: 22 mmol/L (ref 22–32)
Calcium: 8.8 mg/dL — ABNORMAL LOW (ref 8.9–10.3)
Chloride: 101 mmol/L (ref 98–111)
Creatinine, Ser: 0.66 mg/dL (ref 0.44–1.00)
GFR, Estimated: >60 mL/min (ref >60)
Glucose, Bld: 114 mg/dL — ABNORMAL HIGH (ref 70–99)
Potassium: 3.3 mmol/L — ABNORMAL LOW (ref 3.5–5.1)
Sodium: 135 mmol/L (ref 135–145)

## 2021-10-09 LAB — PHOSPHORUS: Phosphorus: 3.6 mg/dL (ref 2.5–4.6)

## 2021-10-09 LAB — MAGNESIUM: Magnesium: 1.9 mg/dL (ref 1.7–2.4)

## 2021-10-09 MED ORDER — MAGNESIUM SULFATE IN D5W 1-5 GM/100ML-% IV SOLN
1.0000 g | Freq: Once | INTRAVENOUS | Status: AC
  Administered 2021-10-09: 20:00:00 1 g via INTRAVENOUS
  Filled 2021-10-09: qty 100

## 2021-10-09 MED ORDER — POTASSIUM CHLORIDE 10 MEQ/100ML IV SOLN
10.0000 meq | INTRAVENOUS | Status: AC
  Administered 2021-10-09 (×3): 10 meq via INTRAVENOUS
  Filled 2021-10-09 (×3): qty 100

## 2021-10-09 MED ORDER — SODIUM CHLORIDE 0.9 % IV SOLN: INTRAVENOUS | Status: DC | PRN

## 2021-10-09 NOTE — Progress Notes (Signed)
This RN went in to administer pt's 0600 heparin injection, when giving dose to pt she stated "There is no shot to be given". She also refused labs to be drawn this morning

## 2021-10-09 NOTE — Progress Notes (Signed)
[facility] [place] [facility] [place] NAME: Karen [name]    MR#:  [id number]  DATE OF BIRTH:  1976/01/14  SUBJECTIVE:  Patient eyes closed. No communication. Getting IV fluids. Pulled 2 NG's  Does not interact with staff also   VITALS:  Blood pressure 115/80, pulse 70, temperature 97.9 F (36.6 C), resp. rate 17, height 5\' 3"  (1.6 m), weight 60.8 kg, SpO2 98 %.  PHYSICAL EXAMINATION:   GENERAL:  46 y.o.-year-old patient lying in the bed with no acute distress. disheveled LUNGS: Normal breath sounds bilaterally, no wheezing, rales, rhonchi.  CARDIOVASCULAR: S1, S2 normal. NEUROLOGIC: patient is asleep limited exam due to patient participation LABORATORY PANEL:  CBC Recent Labs  Lab 10/06/21 0459  WBC 9.4  HGB 12.0  HCT 36.6  PLT 269     Chemistries  Recent Labs  Lab 10/04/21 1901 10/05/21 0030 10/09/21 0848  NA 145   < > 135  K 3.3*   < > 3.3*  CL 107   < > 101  CO2 28   < > 22  GLUCOSE 141*   < > 114*  BUN 32*   < > 10  CREATININE 0.83   < > 0.66  CALCIUM 8.6*   < > 8.8*  MG  --    < > 1.9  AST 41  --   --   ALT 30  --   --   ALKPHOS 94  --   --   BILITOT 0.6  --   --    < > = values in this interval not displayed.    Cardiac Enzymes No results for input(s): TROPONINI in the last 168 hours. RADIOLOGY:  No results found.  Assessment and Plan DOXIE AUGENSTEIN is a 46 y.o. female with medical history significant of altered mental status, takes, psychosis, Ehlers-Danlos syndrome, fibromyalgia presenting by EMS for lying on the floor for last several days and not moving Per ED providers note.  Patient unable to provide history no family at bedside.  history of paranoid psychosis with hallucinations and delusions/catatonia medication noncompliance severe major depression Depression and anxiety: -- discussed with Dr. [name] --plans for ECT  -- cardiology clearance obtained from Dr. [name] for ECT --ECT per Psych when  they are able to get consent from her mother.  Sepsis secondary to UTI -Patient started on Rocephin x 3 days-- completed -- urine cultures pending -- blood cultures negative so far -- lactic acid levels 2.2 --received IVF per sepsis protocol --sepsis resolved   Elevated troponin w/o cardiac history -appears demand ischemia likely due to sepsis. --pt unable to give any history or ROS --troponin remains flat ---echo unremarkable -- seen by Dr. [name] cleared for ECT    Failure to thrive/significant weight loss of more than 30 pounds per mother Nutrition Status: Nutrition Problem: Moderate Malnutrition Etiology: chronic illness (psychiatric illness) Signs/Symptoms: mild fat depletion, moderate fat depletion, mild muscle depletion, moderate muscle depletion Interventions: Refer to RD note for recommendations, MVI  -- overall seems to have a poor long-term prognosis -- spoke with patient's mother Jorene Guest over the phone yesterday. Recommended dietitian recommendations and mother is okay with placing NG tube and starting nutrition. --2/23--pt pulled out NG tubes x 2 even with mitts on--will not be placing it. --hopefully will eat if she improved with ECT (yet to start)   Thyroid nodule: - thyroid ultrasound Diffusely heterogeneous and enlarged thyroid gland most consistent with diffuse goitrous change.  Recommend follow-up  ultrasound in 1 year.  -- Patient will follow-up with PCP   palliative care discussed with mother at length. Patient is full code. Mother wants full scope of treatment.   patient overall has a very poor prognosis.     Procedures: Family communication  none today Consults : psychiatry, cardiology palliative care CODE STATUS: full DVT Prophylaxis : heparin Level of care: Med-Surg Status is: Inpatient Remains inpatient appropriate because: failure to thrive, UTI, paranoia/?catatonia Psychiatry plans for ECT next week once consent obtained           TOTAL TIME TAKING CARE OF THIS PATIENT: 20 minutes.  >50% time spent on counselling and coordination of care  Note: This dictation was prepared with Dragon dictation along with smaller phrase technology. Any transcriptional errors that result from this process are unintentional.  Fritzi Mandes M.D    [facility]   CC: Primary care physician; Birdie Sons, MD

## 2021-10-09 NOTE — Consult Note (Signed)
PHARMACY CONSULT NOTE - FOLLOW UP  Pharmacy Consult for Electrolyte Monitoring and Replacement   Recent Labs: Potassium (mmol/L)  Date Value  10/09/2021 3.3 (L)  02/19/2014 3.9   Magnesium (mg/dL)  Date Value  10/09/2021 1.9   Calcium (mg/dL)  Date Value  10/09/2021 8.8 (L)   Calcium, Total (mg/dL)  Date Value  02/19/2014 9.0   Albumin (g/dL)  Date Value  10/04/2021 3.6  10/15/2019 4.5  02/19/2014 3.6   Phosphorus (mg/dL)  Date Value  10/09/2021 3.6   Sodium (mmol/L)  Date Value  10/09/2021 135  10/15/2019 140  02/19/2014 140     Assessment: Pharmacy has been consulted to monitor and replace electrolytes in 45yo patient admitted with sepsis secondary to UTI. She is currently in a catatonic state and per psychology, will start patient on ECT. Patient with significant weight loss of more than 30 pounds. Dietician also following. -failure to thrive  Patient pulled out NG tube x 2 on 2/23- not planning on replacing  Goal of Therapy:  Electrolytes WNL  Plan:  K 3.3- will order KCL 10 meq IV x 3  Mag 1.9- will order Magnesium 1 gram IV x 1  --will recheck electrolytes with AM labs  Noralee Space ,PharmD Clinical Pharmacist 10/09/2021 10:02 AM

## 2021-10-09 NOTE — TOC Progression Note (Addendum)
Transition of Care [facility]) - Progression Note    Patient Details  Name: IQRA ROTUNDO MRN: [id number] Date of Birth: [birth year]  [facility] CM/SW Contact  Eileen Stanford, LCSW Phone Number: 10/09/2021, 1:58 PM  Clinical Narrative:   CSW spoke with pt's mother. Pt's mother states she  wants [facility] whenever pt is medically stable. Pt's mother did not have a agency preference. Pt's mother states she doesn't believe pt is close as she can not talk or do anything. Pt's mother states pt has a motorized wheelchair she uses to get around at home. Pt's mother declines any other DME. CSW notified pt's mother that psychiatrist was asking that pt's mother be here or available by phone Monday morning. Per pt's mother if she is able to come she will be here 9:00-11:00 AM Monday morning but if not here she is available by phone. Pt's mother states she has a apt after 11:00 and she will not be available for 30 minutes. Will let the MD know.  Pt has been referred to [facility], via PCP on 2/17. Will notified Cheryl at [facility] on dc.   Expected Discharge Plan:  (Home with parents as caregivers) Barriers to Discharge: Continued Medical Work up  Expected Discharge Plan and Services Expected Discharge Plan:  (Home with parents as caregivers)   Discharge Planning Services: CM Consult   Living arrangements for the past 2 months: Single Family Home                                       Social Determinants of Health (SDOH) Interventions    Readmission Risk Interventions No flowsheet data found.

## 2021-10-09 NOTE — Progress Notes (Signed)
Went into pt's room to silence IV alarm, upon entering pt was saying help. She did not want me to turn off the beep on the IV. She then began to try to pull out her IV. I applied the mitts and she bit one of them off. I had to call to get some help to get her IV wrapped d/t pt scratching. When talking to pt and using her name she told me her name is not  [name]. "It's round worm rat girl". Mitts were applied with the help of another RN and IV was wrapped.

## 2021-10-09 NOTE — Progress Notes (Signed)
[facility] Cardiology    SUBJECTIVE: Patient catatonic not responsive to verbal or physical stimuli even sternal rub   Vitals:   10/08/21 1501 10/08/21 2050 10/09/21 0554 10/09/21 0814  BP: 117/79 122/76 126/83 115/80  Pulse: 80 77 65 70  Resp: 16 16 16 17   Temp: 98.4 F (36.9 C) 98.4 F (36.9 C) 99.3 F (37.4 C) 97.9 F (36.6 C)  TempSrc: Oral Oral Oral   SpO2: 100% 99% 100% 98%  Weight:      Height:         Intake/Output Summary (Last 24 hours) at 10/09/2021 8315 Last data filed at 10/09/2021 0700 Gross per 24 hour  Intake 510.28 ml  Output 1400 ml  Net -889.72 ml      PHYSICAL EXAM  General: Well developed, well nourished, in no acute distress HEENT:  Normocephalic and atramatic Neck:  No JVD.  Lungs: Clear bilaterally to auscultation and percussion. Heart: HRRR . Normal S1 and S2 without gallops or murmurs.  Abdomen: Bowel sounds are positive, abdomen soft and non-tender  Msk:  Back normal, normal gait. Normal strength and tone for age. Extremities: No clubbing, cyanosis or edema.   Neuro: Catatonic Psych: Catatonic unresponsive   LABS: Basic Metabolic Panel: Recent Labs    10/07/21 1116 10/08/21 0505 10/09/21 0848  NA  --  137 135  K  --  4.2 3.3*  CL  --  101 101  CO2  --  23 22  GLUCOSE  --  54* 114*  BUN  --  13 10  CREATININE  --  0.67 0.66  CALCIUM  --  9.0 8.8*  MG 1.9  --  1.9  PHOS 3.7  --  3.6   Liver Function Tests: No results for input(s): AST, ALT, ALKPHOS, BILITOT, PROT, ALBUMIN in the last 72 hours. No results for input(s): LIPASE, AMYLASE in the last 72 hours. CBC: No results for input(s): WBC, NEUTROABS, HGB, HCT, MCV, PLT in the last 72 hours. Cardiac Enzymes: No results for input(s): CKTOTAL, CKMB, CKMBINDEX, TROPONINI in the last 72 hours. BNP: Invalid input(s): POCBNP D-Dimer: No results for input(s): DDIMER in the last 72 hours. Hemoglobin A1C: No results for input(s): HGBA1C in the last 72 hours. Fasting Lipid Panel: No  results for input(s): CHOL, HDL, LDLCALC, TRIG, CHOLHDL, LDLDIRECT in the last 72 hours. Thyroid Function Tests: No results for input(s): TSH, T4TOTAL, T3FREE, THYROIDAB in the last 72 hours.  Invalid input(s): FREET3 Anemia Panel: No results for input(s): VITAMINB12, FOLATE, FERRITIN, TIBC, IRON, RETICCTPCT in the last 72 hours.  DG Abd 1 View  Result Date: 10/07/2021 CLINICAL DATA:  Encounter for nasogastric tube placement EXAM: ABDOMEN - 1 VIEW COMPARISON:  Portable exam 1043 hours compared to 11/21/2012 FINDINGS: Tip of feeding tube projects over proximal stomach. Normal bowel gas pattern. Lung bases clear. No acute osseous findings. IMPRESSION: Tip of feeding tube projects over proximal stomach. Electronically Signed   By: Lavonia Dana M.D.   On: 10/07/2021 11:05     Echo normal overall left ventricular function EF around 60%  TELEMETRY: Sinus rhythm rate of 70  ASSESSMENT AND PLAN:  Principal Problem:   Catatonia Active Problems:   Depression with anxiety   Seizure disorder (HCC)   Sepsis secondary to UTI ([place])   AMS (altered mental status)   Thyroid nodule   Hypokalemia   Elevated troponin   Depression, major, recurrent, severe with psychosis ([place])   Malnutrition of moderate degree    Plan Catatonia recommend aggressive  neurological psychiatric care possibly with ECT Severe depression altered mental status catatonia recommend aggressive therapy Murmur on exam echocardiogram with normal valvular structures and LV function Borderline troponins continue conservative management no further cardiac work-up indicated Reported seizure activity in no obvious seizures at this stage consider neurology input Await for ECT treatment Conservative cardiac input for now   Yolonda Kida, MD, 10/09/2021 9:29 AM

## 2021-10-10 DIAGNOSIS — F061 Catatonic disorder due to known physiological condition: Secondary | ICD-10-CM | POA: Diagnosis not present

## 2021-10-10 LAB — CULTURE, BLOOD (ROUTINE X 2)
Culture: NO GROWTH
Special Requests: ADEQUATE

## 2021-10-10 LAB — PHOSPHORUS: Phosphorus: 3.3 mg/dL (ref 2.5–4.6)

## 2021-10-10 LAB — MAGNESIUM: Magnesium: 2 mg/dL (ref 1.7–2.4)

## 2021-10-10 LAB — BASIC METABOLIC PANEL
Anion gap: 8 (ref 5–15)
BUN: 7 mg/dL (ref 6–20)
CO2: 25 mmol/L (ref 22–32)
Calcium: 8.7 mg/dL — ABNORMAL LOW (ref 8.9–10.3)
Chloride: 105 mmol/L (ref 98–111)
Creatinine, Ser: 0.57 mg/dL (ref 0.44–1.00)
GFR, Estimated: >60 mL/min (ref >60)
Glucose, Bld: 111 mg/dL — ABNORMAL HIGH (ref 70–99)
Potassium: 3.2 mmol/L — ABNORMAL LOW (ref 3.5–5.1)
Sodium: 138 mmol/L (ref 135–145)

## 2021-10-10 MED ORDER — POTASSIUM CHLORIDE 10 MEQ/100ML IV SOLN
10.0000 meq | INTRAVENOUS | Status: AC
  Administered 2021-10-10 (×4): 10 meq via INTRAVENOUS
  Filled 2021-10-10 (×4): qty 100

## 2021-10-10 NOTE — Progress Notes (Signed)
[facility] NAME: Karen [name]    MR#:  [id number]  DATE OF BIRTH:  [birth year]  SUBJECTIVE:  Patient eyes closed. No communication. Getting IV fluids. Does not interact with staff also   VITALS:  Blood pressure 129/86, pulse 74, temperature 98.2 F (36.8 C), temperature source Oral, resp. rate 18, height 5\' 3"  (1.6 m), weight 60.8 kg, SpO2 100 %.  PHYSICAL EXAMINATION:   GENERAL:  46 y.o.-year-old patient lying in the bed with no acute distress. disheveled LUNGS: Normal breath sounds bilaterally CARDIOVASCULAR: S1, S2 normal. NEUROLOGIC: patient is asleep, nonverbal limited exam due to patient participation LABORATORY PANEL:  CBC Recent Labs  Lab 10/06/21 0459  WBC 9.4  HGB 12.0  HCT 36.6  PLT 269     Chemistries  Recent Labs  Lab 10/04/21 1901 10/05/21 0030 10/10/21 0444  NA 145   < > 138  K 3.3*   < > 3.2*  CL 107   < > 105  CO2 28   < > 25  GLUCOSE 141*   < > 111*  BUN 32*   < > 7  CREATININE 0.83   < > 0.57  CALCIUM 8.6*   < > 8.7*  MG  --    < > 2.0  AST 41  --   --   ALT 30  --   --   ALKPHOS 94  --   --   BILITOT 0.6  --   --    < > = values in this interval not displayed.    Cardiac Enzymes No results for input(s): TROPONINI in the last 168 hours. RADIOLOGY:  No results found.  Assessment and Plan Karen [name] is a 46 y.o. female with medical history significant of altered mental status, takes, psychosis, Ehlers-Danlos syndrome, fibromyalgia presenting by EMS for lying on the floor for last several days and not moving Per ED providers note.  Patient unable to provide history no family at bedside.  history of paranoid psychosis with hallucinations and delusions/catatonia medication noncompliance severe major depression Depression and anxiety: -- discussed with Dr. [name] --plans for ECT  -- cardiology clearance obtained from Dr. [name] for ECT --ECT per Psych when they are able  to get consent from her mother.  Sepsis secondary to UTI -Patient started on Rocephin x 3 days-- completed -- urine cultures pending -- blood cultures negative so far -- lactic acid levels 2.2 --received IVF per sepsis protocol --sepsis resolved   Elevated troponin w/o cardiac history -appears demand ischemia likely due to sepsis. --pt unable to give any history or ROS --troponin remains flat ---echo unremarkable -- seen by Dr. [name] cleared for ECT    Failure to thrive/significant weight loss of more than 30 pounds per mother Nutrition Status: Nutrition Problem: Moderate Malnutrition Etiology: chronic illness (psychiatric illness) Signs/Symptoms: mild fat depletion, moderate fat depletion, mild muscle depletion, moderate muscle depletion Interventions: Refer to RD note for recommendations, MVI  -- overall seems to have a poor long-term prognosis -- spoke with patient's mother Karen [name] over the phone yesterday. Recommended dietitian recommendations and mother is okay with placing NG tube and starting nutrition. --2/23--pt pulled out NG tubes x 2 even with mitts on--will not be placing it. --hopefully will eat if she improved with ECT (yet to start)   Thyroid nodule: - thyroid ultrasound Diffusely heterogeneous and enlarged thyroid gland most consistent with diffuse goitrous change.  Recommend follow-up ultrasound in 1 year.  --  Patient will follow-up with PCP   palliative care discussed with mother at length. Patient is full code. Mother wants full scope of treatment.   patient overall has a very poor prognosis.      Family communication  none today Consults : psychiatry, cardiology palliative care CODE STATUS: full DVT Prophylaxis : heparin Level of care: Med-Surg Status is: Inpatient Remains inpatient appropriate because: failure to thrive, UTI, paranoia/?catatonia Psychiatry plans for ECT next week once consent obtained          TOTAL TIME TAKING CARE  OF THIS PATIENT: 20 minutes.  >50% time spent on counselling and coordination of care  Note: This dictation was prepared with Dragon dictation along with smaller phrase technology. Any transcriptional errors that result from this process are unintentional.  Fritzi Mandes M.D    [facility]   CC: Primary care physician; Birdie Sons, MD

## 2021-10-10 NOTE — Consult Note (Signed)
PHARMACY CONSULT NOTE - FOLLOW UP  Pharmacy Consult for Electrolyte Monitoring and Replacement   Recent Labs: Potassium (mmol/L)  Date Value  10/10/2021 3.2 (L)  02/19/2014 3.9   Magnesium (mg/dL)  Date Value  10/10/2021 2.0   Calcium (mg/dL)  Date Value  10/10/2021 8.7 (L)   Calcium, Total (mg/dL)  Date Value  02/19/2014 9.0   Albumin (g/dL)  Date Value  10/04/2021 3.6  10/15/2019 4.5  02/19/2014 3.6   Phosphorus (mg/dL)  Date Value  10/10/2021 3.3   Sodium (mmol/L)  Date Value  10/10/2021 138  10/15/2019 140  02/19/2014 140     Assessment: Pharmacy has been consulted to monitor and replace electrolytes in 45yo patient admitted with sepsis secondary to UTI. She is currently in a catatonic state and per psychology, will start patient on ECT. Patient with significant weight loss of more than 30 pounds. Dietician also following. -failure to thrive  Patient pulled out NG tube x 2 on 2/23- not planning on replacing  Diet: NPO  Goal of Therapy:  Electrolytes WNL  Plan:  K 3.2- will order KCL 10 meq IV x 4 --will recheck electrolytes with AM labs  Noralee Space ,PharmD Clinical Pharmacist 10/10/2021 10:47 AM

## 2021-10-11 ENCOUNTER — Inpatient Hospital Stay (HOSPITAL_COMMUNITY)
Admit: 2021-10-11 | Discharge: 2021-10-11 | Disposition: A | Discharge status: Home / Self Care | Payer: Medicare Other | Attending: Internal Medicine | Admitting: Internal Medicine

## 2021-10-11 ENCOUNTER — Inpatient Hospital Stay: Payer: Medicare Other | Admitting: Certified Registered Nurse Anesthetist

## 2021-10-11 ENCOUNTER — Other Ambulatory Visit: Payer: Self-pay | Admitting: Psychiatry

## 2021-10-11 DIAGNOSIS — F061 Catatonic disorder due to known physiological condition: Secondary | ICD-10-CM

## 2021-10-11 DIAGNOSIS — F333 Major depressive disorder, recurrent, severe with psychotic symptoms: Secondary | ICD-10-CM | POA: Diagnosis not present

## 2021-10-11 DIAGNOSIS — J45909 Unspecified asthma, uncomplicated: Secondary | ICD-10-CM | POA: Diagnosis not present

## 2021-10-11 LAB — BASIC METABOLIC PANEL
Anion gap: 14 (ref 5–15)
BUN: 6 mg/dL (ref 6–20)
CO2: 22 mmol/L (ref 22–32)
Calcium: 9 mg/dL (ref 8.9–10.3)
Chloride: 105 mmol/L (ref 98–111)
Creatinine, Ser: 0.75 mg/dL (ref 0.44–1.00)
GFR, Estimated: >60 mL/min (ref >60)
Glucose, Bld: 96 mg/dL (ref 70–99)
Potassium: 3.5 mmol/L (ref 3.5–5.1)
Sodium: 141 mmol/L (ref 135–145)

## 2021-10-11 LAB — MAGNESIUM: Magnesium: 2.2 mg/dL (ref 1.7–2.4)

## 2021-10-11 LAB — PHOSPHORUS: Phosphorus: 4.2 mg/dL (ref 2.5–4.6)

## 2021-10-11 MED ORDER — MIDAZOLAM HCL 2 MG/2ML IJ SOLN
INTRAMUSCULAR | Status: AC
  Filled 2021-10-11: qty 2

## 2021-10-11 MED ORDER — KCL IN DEXTROSE-NACL 20-5-0.9 MEQ/L-%-% IV SOLN
INTRAVENOUS | Status: DC
  Filled 2021-10-11 (×3): qty 1000

## 2021-10-11 MED ORDER — HALOPERIDOL LACTATE 5 MG/ML IJ SOLN: 2.0000 mg | Freq: Four times a day (QID) | INTRAMUSCULAR | Status: DC | PRN

## 2021-10-11 MED ORDER — SUCCINYLCHOLINE CHLORIDE 200 MG/10ML IV SOSY
PREFILLED_SYRINGE | INTRAVENOUS | Status: DC | PRN
  Administered 2021-10-11: 60 mg via INTRAVENOUS

## 2021-10-11 MED ORDER — MIDAZOLAM HCL 2 MG/2ML IJ SOLN
INTRAMUSCULAR | Status: DC | PRN
Start: 2021-10-11 — End: 2021-10-11
  Administered 2021-10-11: 2 mg via INTRAVENOUS

## 2021-10-11 MED ORDER — OLANZAPINE 10 MG PO TBDP
10.0000 mg | ORAL_TABLET | Freq: Every day | ORAL | Status: DC
  Administered 2021-10-12 – 2021-10-17 (×4): 10 mg via ORAL
  Filled 2021-10-11 (×8): qty 1

## 2021-10-11 MED ORDER — DIPHENHYDRAMINE HCL 50 MG/ML IJ SOLN
50.0000 mg | Freq: Once | INTRAMUSCULAR | Status: AC
  Administered 2021-10-11: 11:00:00 50 mg via INTRAVENOUS
  Filled 2021-10-11: qty 1

## 2021-10-11 MED ORDER — SODIUM CHLORIDE 0.9 % IV SOLN: 500.0000 mL | Freq: Once | INTRAVENOUS | Status: DC

## 2021-10-11 MED ORDER — HALOPERIDOL LACTATE 5 MG/ML IJ SOLN
2.0000 mg | Freq: Once | INTRAMUSCULAR | Status: AC
  Administered 2021-10-11: 11:00:00 2 mg via INTRAVENOUS
  Filled 2021-10-11: qty 1

## 2021-10-11 MED ORDER — METHOHEXITAL SODIUM 100 MG/10ML IV SOSY
PREFILLED_SYRINGE | INTRAVENOUS | Status: DC | PRN
  Administered 2021-10-11: 60 mg via INTRAVENOUS

## 2021-10-11 MED ORDER — ENOXAPARIN SODIUM 40 MG/0.4ML IJ SOSY
40.0000 mg | PREFILLED_SYRINGE | INTRAMUSCULAR | Status: DC
  Administered 2021-10-11 – 2021-10-26 (×15): 40 mg via SUBCUTANEOUS
  Filled 2021-10-11 (×16): qty 0.4

## 2021-10-11 MED ORDER — SEVOFLURANE IN SOLN
RESPIRATORY_TRACT | Status: AC
  Filled 2021-10-11: qty 250

## 2021-10-11 NOTE — Progress Notes (Incomplete)
[facility] Cardiology    SUBJECTIVE: ***   Vitals:   10/10/21 0406 10/10/21 0858 10/10/21 1600 10/10/21 2125  BP: 127/88 129/86 127/84 116/90  Pulse: 67 74 73 73  Resp: 18  18 16   Temp: 97.8 F (36.6 C) 98.2 F (36.8 C) 98.1 F (36.7 C) 98.7 F (37.1 C)  TempSrc:  Oral Oral Oral  SpO2: 100% 100% 100% 100%  Weight:      Height:         Intake/Output Summary (Last 24 hours) at 10/11/2021 0001 Last data filed at 10/10/2021 2323 Gross per 24 hour  Intake 1771.68 ml  Output 1200 ml  Net 571.68 ml      PHYSICAL EXAM  General: Well developed, well nourished, in no acute distress HEENT:  Normocephalic and atramatic Neck:  No JVD.  Lungs: Clear bilaterally to auscultation and percussion. Heart: HRRR . Normal S1 and S2 without gallops or murmurs.  Abdomen: Bowel sounds are positive, abdomen soft and non-tender  Msk:  Back normal, normal gait. Normal strength and tone for age. Extremities: No clubbing, cyanosis or edema.   Neuro: Alert and oriented X 3. Psych:  Good affect, responds appropriately   LABS: Basic Metabolic Panel: Recent Labs    10/09/21 0848 10/10/21 0444  NA 135 138  K 3.3* 3.2*  CL 101 105  CO2 22 25  GLUCOSE 114* 111*  BUN 10 7  CREATININE 0.66 0.57  CALCIUM 8.8* 8.7*  MG 1.9 2.0  PHOS 3.6 3.3   Liver Function Tests: No results for input(s): AST, ALT, ALKPHOS, BILITOT, PROT, ALBUMIN in the last 72 hours. No results for input(s): LIPASE, AMYLASE in the last 72 hours. CBC: No results for input(s): WBC, NEUTROABS, HGB, HCT, MCV, PLT in the last 72 hours. Cardiac Enzymes: No results for input(s): CKTOTAL, CKMB, CKMBINDEX, TROPONINI in the last 72 hours. BNP: Invalid input(s): POCBNP D-Dimer: No results for input(s): DDIMER in the last 72 hours. Hemoglobin A1C: No results for input(s): HGBA1C in the last 72 hours. Fasting Lipid Panel: No results for input(s): CHOL, HDL, LDLCALC, TRIG, CHOLHDL, LDLDIRECT in the last 72 hours. Thyroid Function  Tests: No results for input(s): TSH, T4TOTAL, T3FREE, THYROIDAB in the last 72 hours.  Invalid input(s): FREET3 Anemia Panel: No results for input(s): VITAMINB12, FOLATE, FERRITIN, TIBC, IRON, RETICCTPCT in the last 72 hours.  No results found.   Echo ***  TELEMETRY: ***:  ASSESSMENT AND PLAN:  Principal Problem:   Catatonia Active Problems:   Depression with anxiety   Seizure disorder (HCC)   Sepsis secondary to UTI ([place])   AMS (altered mental status)   Thyroid nodule   Hypokalemia   Elevated troponin   Depression, major, recurrent, severe with psychosis ([place])   Malnutrition of moderate degree    1. ***   Yolonda Kida, MD,  10/11/2021 12:01 AM

## 2021-10-11 NOTE — Procedures (Signed)
ECT SERVICES Physicians Interval Evaluation & Treatment Note  Patient Identification: Karen [name] MRN:  [id number] Date of Evaluation:  10/11/2021 TX #: 1  MADRS:   MMSE:   P.E. Findings:  Patient is very withdrawn dry skin and dry mucous membranes decreased muscle tone  Psychiatric Interval Note:  Unable to give any information catatonic for weeks  Subjective:  Patient is a 46 y.o. female seen for evaluation for Electroconvulsive Therapy. Not speaking  Treatment Summary:   []   Right Unilateral             [x]  Bilateral   % Energy : 1.0 ms 40%   Impedance: 2330 ohms  Seizure Energy Index: 7966 V squared  Postictal Suppression Index: 95%  Seizure Concordance Index: 98%  Medications  Pre Shock: Brevital 60 mg succinylcholine 60 mg  Post Shock: Versed 2 mg given to hold seizure  Seizure Duration: EMG 33 seconds EEG 192 seconds   Comments: Well tolerated no complications  Lungs:  [x]   Clear to auscultation               []  Other:   Heart:    [x]   Regular rhythm             []  irregular rhythm    [x]   Previous H&P reviewed, patient examined and there are NO CHANGES                 []   Previous H&P reviewed, patient examined and there are changes noted.   Alethia Berthold, MD 2/27/20234:50 PM

## 2021-10-11 NOTE — TOC Progression Note (Signed)
Transition of Care [facility]) - Progression Note    Patient Details  Name: APRYLE STOWELL MRN: [id number] Date of Birth: 1976-01-31  Transition of Care [facility]) CM/SW [place], RN Phone Number: 10/11/2021, 4:02 PM  Clinical Narrative:   patient continues to be seen for ECT treatments.    Expected Discharge Plan:  (Home with parents as caregivers) Barriers to Discharge: Continued Medical Work up  Expected Discharge Plan and Services Expected Discharge Plan:  (Home with parents as caregivers)   Discharge Planning Services: CM Consult   Living arrangements for the past 2 months: Single Family Home                                       Social Determinants of Health (SDOH) Interventions    Readmission Risk Interventions No flowsheet data found.

## 2021-10-11 NOTE — H&P (Signed)
Karen [name] is an 46 y.o. female.   Chief Complaint: Patient not speaking has no specific complaint HPI: Prior to treatment patient is unresponsive  Past Medical History:  Diagnosis Date   Ehlers-Danlos syndrome    Fibromyalgia    Headache    Sleep apnea    Tics of organic origin    Transient alteration of awareness     Past Surgical History:  Procedure Laterality Date   COLONOSCOPY WITH PROPOFOL N/A 02/13/2018   Procedure: COLONOSCOPY WITH PROPOFOL;  Surgeon: [name], MD;  Location: [facility] ENDOSCOPY;  Service: Endoscopy;  Laterality: N/A;   DILATION AND CURETTAGE OF UTERUS  2009   ESOPHAGOGASTRODUODENOSCOPY (EGD) WITH PROPOFOL  02/13/2018   Procedure: ESOPHAGOGASTRODUODENOSCOPY (EGD) WITH PROPOFOL;  Surgeon: [name], MD;  Location: [facility] ENDOSCOPY;  Service: Endoscopy;;   EYE SURGERY Left 1990   3 Surgeries on left eye to correct crossed eye   LAPAROSCOPY Left 2003   Fallopian Tube   [facility]   3 Surgeries to repair broken arm   OTHER SURGICAL HISTORY Left    3 surgeries on her left thigh as an infant   OTHER SURGICAL HISTORY  1998   Jaw surgery   Right arm surgery  1987   x 3 due to fracture   TOENAIL EXCISION Bilateral 06/13/2019   Procedure: BILATERAL SECOND TOE PARTIAL NAIL ABLATION;  Surgeon: Erle Crocker, MD;  Location: [place];  Service: Orthopedics;  Laterality: Bilateral;  SURGERY REQUEST TIME 1 HOUR   TONSILLECTOMY  12/13/2002    Family History  Problem Relation Age of Onset   Depression Mother    Stroke Mother    Fibromyalgia Mother    Osteoarthritis Mother    Asthma Brother    Depression Brother    Migraines Brother    Asperger's syndrome Brother    Other Brother        Ehlers-Danlos Syndrome   Cancer Maternal Aunt    Asperger's syndrome Maternal Aunt    Breast cancer Maternal Aunt    Heart disease Paternal Aunt    Heart disease Paternal Uncle    Cervical cancer  Maternal Grandmother    Osteoporosis Maternal Grandmother        Died at 87   Osteoarthritis Maternal Grandmother    Colon cancer Maternal Grandfather    Asthma Maternal Grandfather    Asperger's syndrome Maternal Grandfather    Emphysema Maternal Grandfather        Died at 35   Prostate cancer Maternal Grandfather    Heart attack Paternal Grandfather        Died at 17   Asthma Paternal Grandfather    Tuberculosis Paternal Grandfather    Cancer Cousin    Asperger's syndrome Cousin    Asperger's syndrome Other    Heart Problems Paternal 79        Died at 69   Ehlers-Danlos syndrome Father    Ehlers-Danlos syndrome Brother    Social History:  reports that she has never smoked. She has never used smokeless tobacco. She reports that she does not drink alcohol and does not use drugs.  Allergies:  Allergies  Allergen Reactions   Augmentin  [Amoxicillin-Pot Clavulanate] Diarrhea   Chocolate Flavor     Other reaction(s): Other (See Comments) Wheezing, acne   Demerol [Meperidine] Other (See Comments)    Slow to wake up when this drug is given.    Keflex [Cephalexin] Nausea  And Vomiting   Morphine And Related Nausea And Vomiting   Tape Dermatitis    Must use paper tape only   Toradol [Ketorolac Tromethamine] Nausea And Vomiting   Amoxicillin     Other reaction(s): Unknown   Chocolate     GI distress   Nsaids     patient develops ulcers Other reaction(s): Other (See Comments) patient develops ulcers   Strawberry Extract     GI distress    (Not in a hospital admission)   Results for orders placed or performed during the hospital encounter of 10/04/21 (from the past 48 hour(s))  Basic metabolic panel     Status: Abnormal   Collection Time: 10/10/21  4:44 AM  Result Value Ref Range   Sodium 138 135 - 145 mmol/L   Potassium 3.2 (L) 3.5 - 5.1 mmol/L   Chloride 105 98 - 111 mmol/L   CO2 25 22 - 32 mmol/L   Glucose, Bld 111 (H) 70 - 99 mg/dL    Comment: Glucose  reference range applies only to samples taken after fasting for at least 8 hours.   BUN 7 6 - 20 mg/dL   Creatinine, Ser 0.57 0.44 - 1.00 mg/dL   Calcium 8.7 (L) 8.9 - 10.3 mg/dL   GFR, Estimated >60 >60 mL/min    Comment: (NOTE) Calculated using the CKD-EPI Creatinine Equation (2021)    Anion gap 8 5 - 15    Comment: Performed at [facility], [address] [place], [place] [address]  Magnesium     Status: None   Collection Time: 10/10/21  4:44 AM  Result Value Ref Range   Magnesium 2.0 1.7 - 2.4 mg/dL    Comment: Performed at [facility], [address] [place], [place] [address]  Phosphorus     Status: None   Collection Time: 10/10/21  4:44 AM  Result Value Ref Range   Phosphorus 3.3 2.5 - 4.6 mg/dL    Comment: Performed at [facility], [address] [place], [place] [address]  Basic metabolic panel     Status: None   Collection Time: 10/11/21  4:02 AM  Result Value Ref Range   Sodium 141 135 - 145 mmol/L   Potassium 3.5 3.5 - 5.1 mmol/L   Chloride 105 98 - 111 mmol/L   CO2 22 22 - 32 mmol/L   Glucose, Bld 96 70 - 99 mg/dL    Comment: Glucose reference range applies only to samples taken after fasting for at least 8 hours.   BUN 6 6 - 20 mg/dL   Creatinine, Ser 0.75 0.44 - 1.00 mg/dL   Calcium 9.0 8.9 - 10.3 mg/dL   GFR, Estimated >60 >60 mL/min    Comment: (NOTE) Calculated using the CKD-EPI Creatinine Equation (2021)    Anion gap 14 5 - 15    Comment: Performed at [facility], [address], [place], [place] [address]  Magnesium     Status: None   Collection Time: 10/11/21  4:02 AM  Result Value Ref Range   Magnesium 2.2 1.7 - 2.4 mg/dL    Comment: Performed at [facility], [address] [place], [place] [address]  Phosphorus     Status: None   Collection Time: 10/11/21  4:02 AM  Result Value Ref Range   Phosphorus 4.2 2.5 - 4.6 mg/dL    Comment: Performed at [facility], [address]  [place],  [address]   No results found.  Review  of Systems  Unable to perform ROS: Psychiatric disorder  Constitutional:  Positive for appetite change.  Skin: Negative.    Blood pressure 112/70, pulse 78, temperature 98 F (36.7 C), temperature source Temporal, resp. rate (!) 22, height 5\' 3"  (1.6 m), weight 60.8 kg, SpO2 98 %. Physical Exam Vitals reviewed.  Constitutional:      Appearance: She is well-developed. She is ill-appearing.  HENT:     Head: Normocephalic and atraumatic.  Eyes:     Conjunctiva/sclera: Conjunctivae normal.     Pupils: Pupils are equal, round, and reactive to light.  Cardiovascular:     Heart sounds: Normal heart sounds.  Pulmonary:     Effort: Pulmonary effort is normal.  Abdominal:     Palpations: Abdomen is soft.  Musculoskeletal:        General: Normal range of motion.     Cervical back: Normal range of motion.  Skin:    General: Skin is warm and dry.  Neurological:     Mental Status: She is alert.  Psychiatric:        Mood and Affect: Affect is blunt.        Speech: She is noncommunicative.     Assessment/Plan Initiate ECT for catatonia  Alethia Berthold, MD 10/11/2021, 4:49 PM

## 2021-10-11 NOTE — Progress Notes (Signed)
Patient post ECT, talking, aware of surroundings, her name, talkative, telling this nurse she attending [facility] for music, cooperative and attentive.

## 2021-10-11 NOTE — Progress Notes (Signed)
°   10/11/21 2009  Assess: MEWS Score  Temp 97.6 F (36.4 C)  BP 119/90  Pulse Rate (!) 115  Resp 20  SpO2 98 %  O2 Device Room Air  Assess: MEWS Score  MEWS Temp 0  MEWS Systolic 0  MEWS Pulse 2  MEWS RR 0  MEWS LOC 0  MEWS Score 2  MEWS Score Color Yellow  Assess: if the MEWS score is Yellow or Red  Were vital signs taken at a resting state? Yes  Focused Assessment No change from prior assessment  Does the patient meet 2 or more of the SIRS criteria? No  MEWS guidelines implemented *See Row Information* Yes  Treat  MEWS Interventions Escalated (See documentation below)  Escalate  MEWS: Escalate Yellow: discuss with charge nurse/RN and consider discussing with provider and RRT  Notify: Charge Nurse/RN  Name of Charge Nurse/RN Notified Kennyth Lose  Date Charge Nurse/RN Notified 10/11/21  Time Charge Nurse/RN Notified 2010  Assess: SIRS CRITERIA  SIRS Temperature  0  SIRS Pulse 1  SIRS Respirations  0  SIRS WBC 0  SIRS Score Sum  1   Will continue to monitor

## 2021-10-11 NOTE — Progress Notes (Signed)
[facility] NAME: Karen [name]    MR#:  [id number]  DATE OF BIRTH:  1975/10/29  SUBJECTIVE:  Patient eyes closed. No communication. Getting IV fluids. Not much interaction   VITALS:  Blood pressure (!) 139/91, pulse 64, temperature 97.9 F (36.6 C), temperature source Oral, resp. rate 19, height 5\' 3"  (1.6 m), weight 60.8 kg, SpO2 100 %.  PHYSICAL EXAMINATION:   GENERAL:  46 y.o.-year-old patient lying in the bed with no acute distress. disheveled LUNGS: Normal breath sounds bilaterally CARDIOVASCULAR: S1, S2 normal. NEUROLOGIC: patient is asleep, nonverbal limited exam due to patient participation LABORATORY PANEL:  CBC Recent Labs  Lab 10/06/21 0459  WBC 9.4  HGB 12.0  HCT 36.6  PLT 269     Chemistries  Recent Labs  Lab 10/04/21 1901 10/05/21 0030 10/11/21 0402  NA 145   < > 141  K 3.3*   < > 3.5  CL 107   < > 105  CO2 28   < > 22  GLUCOSE 141*   < > 96  BUN 32*   < > 6  CREATININE 0.83   < > 0.75  CALCIUM 8.6*   < > 9.0  MG  --    < > 2.2  AST 41  --   --   ALT 30  --   --   ALKPHOS 94  --   --   BILITOT 0.6  --   --    < > = values in this interval not displayed.    Cardiac Enzymes No results for input(s): TROPONINI in the last 168 hours. RADIOLOGY:  No results found.  Assessment and Plan SARABELLA [name] is a 46 y.o. female with medical history significant of altered mental status, takes, psychosis, Ehlers-Danlos syndrome, fibromyalgia presenting by EMS for lying on the floor for last several days and not moving Per ED providers note.  Patient unable to provide history no family at bedside.  history of paranoid psychosis with hallucinations and delusions/catatonia medication noncompliance severe major depression Depression and anxiety: -- discussed with Dr. [name] --plans for ECT  -- cardiology clearance obtained from Dr. [name] for ECT --ECT per Psych when they are able to get  consent from her mother.  Sepsis secondary to UTI -Patient started on Rocephin x 3 days-- completed -- urine cultures pending -- blood cultures negative so far -- lactic acid levels 2.2 --received IVF per sepsis protocol --sepsis resolved   Elevated troponin w/o cardiac history -appears demand ischemia likely due to sepsis. --pt unable to give any history or ROS --troponin remains flat ---echo unremarkable -- seen by Dr. [name] cleared for ECT    Failure to thrive/significant weight loss of more than 30 pounds per mother Nutrition Status: Nutrition Problem: Moderate Malnutrition Etiology: chronic illness (psychiatric illness) Signs/Symptoms: mild fat depletion, moderate fat depletion, mild muscle depletion, moderate muscle depletion Interventions: Refer to RD note for recommendations, MVI  -- overall seems to have a poor long-term prognosis -- spoke with patient's mother Karen [name] over the phone yesterday. Recommended dietitian recommendations and mother is okay with placing NG tube and starting nutrition. --2/23--pt pulled out NG tubes x 2 even with mitts on--will not be placing it. --hopefully will eat if she improved with ECT (yet to start)   Thyroid nodule: - thyroid ultrasound Diffusely heterogeneous and enlarged thyroid gland most consistent with diffuse goitrous change.  Recommend follow-up ultrasound in 1 year.  -- Patient  will follow-up with PCP   palliative care discussed with mother at length. Patient is full code. Mother wants full scope of treatment.   patient overall has a very poor prognosis.      Family communication  mother on the phone 2/27 Consults : psychiatry, cardiology palliative care CODE STATUS: full DVT Prophylaxis : heparin Level of care: Med-Surg Status is: Inpatient Remains inpatient appropriate because: failure to thrive, UTI, paranoia/catatonia Psychiatry plans for ECT  treatment          TOTAL TIME TAKING CARE OF THIS PATIENT:  20 minutes.  >50% time spent on counselling and coordination of care  Note: This dictation was prepared with Dragon dictation along with smaller phrase technology. Any transcriptional errors that result from this process are unintentional.  Fritzi Mandes M.D    [facility]   CC: Primary care physician; Birdie Sons, MD

## 2021-10-11 NOTE — Anesthesia Preprocedure Evaluation (Addendum)
Anesthesia Evaluation  Patient identified by MRN, date of birth, ID band Patient awake    Reviewed: Allergy & Precautions, NPO status , Patient's Chart, lab work & pertinent test results  History of Anesthesia Complications (+) PROLONGED EMERGENCE and history of anesthetic complications  Airway Mallampati: III  TM Distance: >3 FB Neck ROM: full    Dental  (+) Chipped, Poor Dentition, Missing   Pulmonary asthma , sleep apnea ,    Pulmonary exam normal        Cardiovascular (-) Past MI negative cardio ROS Normal cardiovascular exam     Neuro/Psych  Headaches, Seizures -, Well Controlled,  PSYCHIATRIC DISORDERS  Neuromuscular disease    GI/Hepatic negative GI ROS, Neg liver ROS, neg GERD  ,  Endo/Other  negative endocrine ROS  Renal/GU negative Renal ROS  negative genitourinary   Musculoskeletal  (+) Arthritis , Fibromyalgia -  Abdominal   Peds  Hematology negative hematology ROS (+)   Anesthesia Other Findings Past Medical History: No date: Ehlers-Danlos syndrome No date: Fibromyalgia No date: Headache No date: Sleep apnea No date: Tics of organic origin No date: Transient alteration of awareness  Past Surgical History: 02/13/2018: COLONOSCOPY WITH PROPOFOL; N/A     Comment:  Procedure: COLONOSCOPY WITH PROPOFOL;  Surgeon: Wohl,               Darren, MD;  Location: [facility] ENDOSCOPY;  Service:               Endoscopy;  Laterality: N/A; 2009: DILATION AND CURETTAGE OF UTERUS 02/13/2018: ESOPHAGOGASTRODUODENOSCOPY (EGD) WITH PROPOFOL     Comment:  Procedure: ESOPHAGOGASTRODUODENOSCOPY (EGD) WITH               PROPOFOL;  Surgeon: Wohl, Darren, MD;  Location: [facility]               ENDOSCOPY;  Service: Endoscopy;; 1990: EYE SURGERY; Left     Comment:  3 Surgeries on left eye to correct crossed eye 2003: LAPAROSCOPY; Left     Comment:  Fallopian Tube 1998: MANDIBLE SURGERY 1987: OTHER SURGICAL HISTORY; Right      Comment:  3 Surgeries to repair broken arm No date: OTHER SURGICAL HISTORY; Left     Comment:  3 surgeries on her left thigh as an infant 1998: OTHER SURGICAL HISTORY     Comment:  Jaw surgery 1987: Right arm surgery     Comment:  x 3 due to fracture 06/13/2019: TOENAIL EXCISION; Bilateral     Comment:  Procedure: BILATERAL SECOND TOE PARTIAL NAIL ABLATION;                Surgeon: Adair, Christopher R, MD;  Location: [facility];  Service: Orthopedics;  Laterality:               Bilateral;  SURGERY REQUEST TIME 1 HOUR 12/13/2002: TONSILLECTOMY     Reproductive/Obstetrics negative OB ROS                             Anesthesia Physical  Anesthesia Plan  ASA: 3  Anesthesia Plan: General   Post-op Pain Management:    Induction: Intravenous  PONV Risk Score and Plan: TIVA  Airway Management Planned: Mask  Additional Equipment:   Intra-op Plan:   Post-operative Plan:   Informed Consent: I have reviewed the patients History and Physical, chart, labs   and discussed the procedure including the risks, benefits and alternatives for the proposed anesthesia with the patient or authorized representative who has indicated his/her understanding and acceptance.     Dental Advisory Given  Plan Discussed with: Anesthesiologist, CRNA and Surgeon  Anesthesia Plan Comments: (History and phone consent from the patients mother [name] Hamor  Mother consented for risks of anesthesia including but not limited to:  - adverse reactions to medications - risk of airway placement if required - damage to eyes, teeth, lips or other oral mucosa - nerve damage due to positioning  - sore throat or hoarseness - Damage to heart, brain, nerves, lungs, other parts of body or loss of life  She voiced understanding.)        Anesthesia Quick Evaluation  

## 2021-10-11 NOTE — Transfer of Care (Signed)
Immediate Anesthesia Transfer of Care Note  Patient: Karen [name]  Procedure(s) Performed: ECT TX  Patient Location: PACU  Anesthesia Type:General  Level of Consciousness: sedated  Airway & Oxygen Therapy: Patient Spontanous Breathing and Patient connected to face mask oxygen  Post-op Assessment: Report given to RN and Post -op Vital signs reviewed and stable  Post vital signs: Reviewed and stable  Last Vitals:  Vitals Value Taken Time  BP 96/73 10/11/21 1420  Temp    Pulse 77 10/11/21 1423  Resp 27 10/11/21 1423  SpO2 100 % 10/11/21 1423  Vitals shown include unvalidated device data.  Last Pain:  Vitals:   10/11/21 1205  TempSrc: Tympanic  PainSc: 0-No pain         Complications: No notable events documented.

## 2021-10-11 NOTE — Progress Notes (Signed)
Patient returned to nonverbal status. Patient now refusing to interact with mother or staff.

## 2021-10-11 NOTE — Progress Notes (Signed)
Assumed care of patient from [place], [place]. Patient speaking full sentences with mother at bedside. Patient states she does not know what year it is and is surprised when she is told it's 2023.
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
## 2021-10-11 NOTE — Consult Note (Signed)
[facility] for Electrolyte Monitoring and Replacement   Recent Labs: Potassium (mmol/L)  Date Value  10/11/2021 3.5  02/19/2014 3.9   Magnesium (mg/dL)  Date Value  10/11/2021 2.2   Calcium (mg/dL)  Date Value  10/11/2021 9.0   Calcium, Total (mg/dL)  Date Value  02/19/2014 9.0   Albumin (g/dL)  Date Value  10/04/2021 3.6  10/15/2019 4.5  02/19/2014 3.6   Phosphorus (mg/dL)  Date Value  10/10/2021 3.3   Sodium (mmol/L)  Date Value  10/11/2021 141  10/15/2019 140  02/19/2014 140   Assessment: Pharmacy has been consulted to monitor and replace electrolytes in 45yo patient admitted with sepsis secondary to UTI. She is currently in a catatonic state and per psychology, will start patient on ECT. Patient with significant weight loss of more than 30 pounds. Dietician also following. -failure to thrive  Patient pulled out NG tube x 2 on 2/23- not planning on replacing  Diet: NPO MIVF: D5NS at 75 cc/hr  Goal of Therapy:  Electrolytes within normal limits  Plan:  --Modify to D5NS + 20 mEq K/L at 75 cc/hr --Follow-up electrolytes with AM labs tomorrow  Benita Gutter 10/11/2021 12:11 PM

## 2021-10-11 NOTE — Progress Notes (Signed)
Patient states she is hearing someone saying her name repeatedly and talking to her. Patient then states, "I know it's just a hallucination though."

## 2021-10-11 NOTE — Progress Notes (Signed)
[facility]) Hospital Liaison note:  This patient is currently enrolled in [facility] outpatient-based Palliative Care. Will continue to follow for disposition.  Please call with any outpatient palliative questions or concerns.  Thank you, Lorelee Market, LPN [facility] Hospital Liaison [phone]

## 2021-10-12 ENCOUNTER — Other Ambulatory Visit: Payer: Self-pay | Admitting: Psychiatry

## 2021-10-12 DIAGNOSIS — F418 Other specified anxiety disorders: Secondary | ICD-10-CM

## 2021-10-12 DIAGNOSIS — F061 Catatonic disorder due to known physiological condition: Secondary | ICD-10-CM | POA: Diagnosis not present

## 2021-10-12 LAB — BASIC METABOLIC PANEL
Anion gap: 9 (ref 5–15)
BUN: 6 mg/dL (ref 6–20)
CO2: 26 mmol/L (ref 22–32)
Calcium: 9.3 mg/dL (ref 8.9–10.3)
Chloride: 109 mmol/L (ref 98–111)
Creatinine, Ser: 0.85 mg/dL (ref 0.44–1.00)
GFR, Estimated: >60 mL/min (ref >60)
Glucose, Bld: 124 mg/dL — ABNORMAL HIGH (ref 70–99)
Potassium: 3.5 mmol/L (ref 3.5–5.1)
Sodium: 144 mmol/L (ref 135–145)

## 2021-10-12 MED ORDER — KCL IN DEXTROSE-NACL 20-5-0.45 MEQ/L-%-% IV SOLN
INTRAVENOUS | Status: DC
  Filled 2021-10-12 (×19): qty 1000

## 2021-10-12 NOTE — Consult Note (Signed)
[facility] for Electrolyte Monitoring and Replacement   Recent Labs: Potassium (mmol/L)  Date Value  10/12/2021 3.5  02/19/2014 3.9   Magnesium (mg/dL)  Date Value  10/11/2021 2.2   Calcium (mg/dL)  Date Value  10/12/2021 9.3   Calcium, Total (mg/dL)  Date Value  02/19/2014 9.0   Albumin (g/dL)  Date Value  10/04/2021 3.6  10/15/2019 4.5  02/19/2014 3.6   Phosphorus (mg/dL)  Date Value  10/11/2021 4.2   Sodium (mmol/L)  Date Value  10/12/2021 144  10/15/2019 140  02/19/2014 140   Assessment: Pharmacy has been consulted to monitor and replace electrolytes in 45yo patient admitted with sepsis secondary to UTI. She is currently in a catatonic state and per psychology, will start patient on ECT. Patient with significant weight loss of more than 30 pounds. Dietician also following. -failure to thrive  Patient pulled out NG tube x 2 on 2/23- not planning on replacing  Diet: NPO MIVF: D5NS + 20 mEq K/L at 75 cc/hr  Goal of Therapy:  Electrolytes within normal limits  Plan:  --Sodium trending up, will modify fluids to D5 1/2 NS + 20 mEq K/L at 75 cc/hr --Will follow labs with less frequency bearing no major changes to medication regimen at this point given stability  Benita Gutter 10/12/2021 8:08 AM

## 2021-10-12 NOTE — Consult Note (Signed)
[facility] Consult   Reason for Consult: Follow-up for this 46 year old woman with catatonia Referring Physician: Posey Pronto Patient Identification: Karen [name] MRN:  [id number] Principal Diagnosis: Catatonia Diagnosis:  Principal Problem:   Catatonia Active Problems:   Depression with anxiety   Seizure disorder ([place])   Sepsis secondary to UTI ([place])   AMS (altered mental status)   Thyroid nodule   Hypokalemia   Elevated troponin   Depression, major, recurrent, severe with psychosis ([place])   Malnutrition of moderate degree   Total Time spent with patient: 20 minutes  Subjective:   Karen [name] is a 46 y.o. female patient admitted with patient seen and chart reviewed.  Patient had her first ECT treatment yesterday which went without any complication.  Upon waking up and then returning to the ward it is reported that the patient had a dramatic change in her mental state.  It is reported that she was awake and communicating with staff.  Unfortunately it also appears that she was loudly stating her psychotic beliefs again.  Patient was given some medication for anxiety and started on antipsychotics.  Seen this afternoon patient was back into the catatonic presentation.  Slightly better insofar as she did open her eyes and move her eyes to look at me but did not move anything else in her head or any other muscles.  Did not make any response to my speech..  HPI: Patient has had a progressive intermittent catatonia on top of what sounds like most likely psychotic depression for several months  Past Psychiatric History: Had not had a previous episode of this  Risk to Self:   Risk to Others:   Prior Inpatient Therapy:   Prior Outpatient Therapy:    Past Medical History:  Past Medical History:  Diagnosis Date   Ehlers-Danlos syndrome    Fibromyalgia    Headache    Sleep apnea    Tics of organic origin    Transient alteration of awareness     Past Surgical  History:  Procedure Laterality Date   COLONOSCOPY WITH PROPOFOL N/A 02/13/2018   Procedure: COLONOSCOPY WITH PROPOFOL;  Surgeon: [name], MD;  Location: [facility] END[facility];  Service: Endoscopy;  Laterality: N/A;   DILATION AND CURETTAGE OF UTERUS  2009   ESOPHAGOGASTRODUODENOSCOPY (EGD) WITH PROPOFOL  02/13/2018   Procedure: ESOPHAGOGASTRODUODENOSCOPY (EGD) WITH PROPOFOL;  Surgeon: [name], MD;  Location: [facility];  Service: Endoscopy;;   EYE SURGERY Left 1990   3 Surgeries on left eye to correct crossed eye   LAPAROSCOPY Left 2003   Fallopian Tube   [place]   3 Surgeries to repair broken arm   OTHER SURGICAL HISTORY Left    3 surgeries on her left thigh as an infant   OTHER SURGICAL HISTORY  1998   Jaw surgery   Right arm surgery  1987   x 3 due to fracture   TOENAIL EXCISION Bilateral 06/13/2019   Procedure: BILATERAL SECOND TOE PARTIAL NAIL ABLATION;  Surgeon: Erle Crocker, MD;  Location: [facility];  Service: Orthopedics;  Laterality: Bilateral;  SURGERY REQUEST TIME 1 HOUR   TONSILLECTOMY  12/13/2002   Family History:  Family History  Problem Relation Age of Onset   Depression Mother    Stroke Mother    Fibromyalgia Mother    Osteoarthritis Mother    Asthma Brother    Depression Brother    Migraines Brother  Asperger's syndrome Brother    Other Brother        Ehlers-Danlos Syndrome   Cancer Maternal Aunt    Asperger's syndrome Maternal Aunt    Breast cancer Maternal Aunt    Heart disease Paternal Aunt    Heart disease Paternal Uncle    Cervical cancer Maternal Grandmother    Osteoporosis Maternal Grandmother        Died at 49   Osteoarthritis Maternal Grandmother    Colon cancer Maternal Grandfather    Asthma Maternal Grandfather    Asperger's syndrome Maternal Grandfather    Emphysema Maternal Grandfather        Died at 77   Prostate cancer Maternal Grandfather    Heart attack  Paternal Grandfather        Died at 33   Asthma Paternal Grandfather    Tuberculosis Paternal Grandfather    Cancer Cousin    Asperger's syndrome Cousin    Asperger's syndrome Other    Heart Problems Paternal Grandmother        Died at 64   Ehlers-Danlos syndrome Father    Ehlers-Danlos syndrome Brother    Family Psychiatric  History: None reported Social History:  Social History   Substance and Sexual Activity  Alcohol Use No   Alcohol/week: 0.0 standard drinks     Social History   Substance and Sexual Activity  Drug Use No    Social History   Socioeconomic History   Marital status: Single    Spouse name: Not on file   Number of children: Not on file   Years of education: Not on file   Highest education level: Not on file  Occupational History   Not on file  Tobacco Use   Smoking status: Never   Smokeless tobacco: Never  Vaping Use   Vaping Use: Never used  Substance and Sexual Activity   Alcohol use: No    Alcohol/week: 0.0 standard drinks   Drug use: No   Sexual activity: Never  Other Topics Concern   Not on file  Social History Narrative   Jailynn is 38.   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    She lives with her mother.    She enjoys reading, watching TV, writing, and painting.    Social Determinants of Health   Financial Resource Strain: Not on file  Food Insecurity: Not on file  Transportation Needs: Not on file  Physical Activity: Not on file  Stress: Not on file  Social Connections: Not on file   Additional Social History:    Allergies:   Allergies  Allergen Reactions   Augmentin  [Amoxicillin-Pot Clavulanate] Diarrhea   Chocolate Flavor     Other reaction(s): Other (See Comments) Wheezing, acne   Demerol [Meperidine] Other (See Comments)    Slow to wake up when this drug is given.    Keflex [Cephalexin] Nausea And Vomiting   Morphine And Related Nausea And Vomiting   Tape Dermatitis    Must use paper tape only   Toradol [Ketorolac  Tromethamine] Nausea And Vomiting   Amoxicillin     Other reaction(s): Unknown   Chocolate     GI distress   Nsaids     patient develops ulcers Other reaction(s): Other (See Comments) patient develops ulcers   Strawberry Extract     GI distress    Labs:  Results for orders placed or performed during the hospital encounter of 10/04/21 (from the past 48 hour(s))  Basic metabolic panel  Status: None   Collection Time: 10/11/21  4:02 AM  Result Value Ref Range   Sodium 141 135 - 145 mmol/L   Potassium 3.5 3.5 - 5.1 mmol/L   Chloride 105 98 - 111 mmol/L   CO2 22 22 - 32 mmol/L   Glucose, Bld 96 70 - 99 mg/dL    Comment: Glucose reference range applies only to samples taken after fasting for at least 8 hours.   BUN 6 6 - 20 mg/dL   Creatinine, Ser 0.75 0.44 - 1.00 mg/dL   Calcium 9.0 8.9 - 10.3 mg/dL   GFR, Estimated >60 >60 mL/min    Comment: (NOTE) Calculated using the CKD-EPI Creatinine Equation (2021)    Anion gap 14 5 - 15    Comment: Performed at [facility], [address], [place], [place] [address]  Magnesium     Status: None   Collection Time: 10/11/21  4:02 AM  Result Value Ref Range   Magnesium 2.2 1.7 - 2.4 mg/dL    Comment: Performed at [facility], [address] [place], [place] [address]  Phosphorus     Status: None   Collection Time: 10/11/21  4:02 AM  Result Value Ref Range   Phosphorus 4.2 2.5 - 4.6 mg/dL    Comment: Performed at [facility], [address] [place], [place] [address]  Basic metabolic panel     Status: Abnormal   Collection Time: 10/12/21  4:59 AM  Result Value Ref Range   Sodium 144 135 - 145 mmol/L   Potassium 3.5 3.5 - 5.1 mmol/L   Chloride 109 98 - 111 mmol/L   CO2 26 22 - 32 mmol/L   Glucose, Bld 124 (H) 70 - 99 mg/dL    Comment: Glucose reference range applies only to samples taken after fasting for at least 8 hours.   BUN 6 6 - 20 mg/dL   Creatinine, Ser 0.85 0.44 - 1.00 mg/dL   Calcium 9.3 8.9  - 10.3 mg/dL   GFR, Estimated >60 >60 mL/min    Comment: (NOTE) Calculated using the CKD-EPI Creatinine Equation (2021)    Anion gap 9 5 - 15    Comment: Performed at [facility], [address] [place], [place] [address]    Current Facility-Administered Medications  Medication Dose Route Frequency Provider Last Rate Last Admin   0.9 %  sodium chloride infusion   Intravenous PRN Fritzi Mandes, MD   Stopped at 10/10/21 2139   acetaminophen (TYLENOL) tablet 650 mg  650 mg Oral Q6H PRN Para Skeans, MD       Or   acetaminophen (TYLENOL) suppository 650 mg  650 mg Rectal Q6H PRN Para Skeans, MD       albuterol (PROVENTIL) (2.5 MG/3ML) 0.083% nebulizer solution 2.5 mg  2.5 mg Nebulization Q6H PRN Belue, Alver Sorrow, RPH       dextrose 5 % and 0.45 % NaCl with KCl 20 mEq/L infusion   Intravenous Continuous Benita Gutter, RPH 75 mL/hr at 10/12/21 0924 New Bag at 10/12/21 0924   enoxaparin (LOVENOX) injection 40 mg  40 mg Subcutaneous Q24H Fritzi Mandes, MD   40 mg at 10/11/21 2033   haloperidol lactate (HALDOL) injection 2 mg  2 mg Intravenous Q6H PRN Brighid Koch, Madie Reno, MD       OLANZapine zydis (ZYPREXA) disintegrating tablet 10 mg  10 mg Oral QHS Sherleen Pangborn T, MD       sodium chloride flush (NS) 0.9 %  injection 3 mL  3 mL Intravenous Q12H Para Skeans, MD   3 mL at 10/12/21 0732    Musculoskeletal: Strength & Muscle Tone: decreased Gait & Station: unable to stand Patient leans: N/A            Psychiatric Specialty Exam:  Presentation  General Appearance: Disheveled  Eye Contact:Fair  Speech:Normal Rate; Clear and Coherent  Speech Volume:Normal  Handedness:Right   Mood and Affect  Mood:Anxious  Affect:Congruent   Thought Process  Thought Processes:Disorganized  Descriptions of Associations:Tangential  Orientation:Full (Time, Place and Person)  Thought Content:Tangential  History of Schizophrenia/Schizoaffective disorder:No  Duration of  Psychotic Symptoms:Greater than six months (Since August 2022)  Hallucinations:No data recorded Ideas of Reference:Paranoia  Suicidal Thoughts:No data recorded Homicidal Thoughts:No data recorded  Sensorium  Memory:Immediate [place]   Executive Functions  Concentration:Fair  Attention Span:Fair  [place] of Knowledge:Good  Language:Good   Psychomotor Activity  Psychomotor Activity:No data recorded  Assets  Assets:Communication Skills; Leisure Time; Social Support; Resilience   Sleep  Sleep:No data recorded  Physical Exam: Physical Exam Constitutional:      Appearance: She is ill-appearing.  HENT:     Head: Normocephalic and atraumatic.     Mouth/Throat:     Pharynx: Oropharynx is clear.  Eyes:     Pupils: Pupils are equal, round, and reactive to light.  Cardiovascular:     Rate and Rhythm: Normal rate and regular rhythm.  Pulmonary:     Effort: Pulmonary effort is normal.     Breath sounds: Normal breath sounds.  Abdominal:     General: Abdomen is flat.     Palpations: Abdomen is soft.  Musculoskeletal:        General: Normal range of motion.  Skin:    General: Skin is warm and dry.  Neurological:     General: No focal deficit present.     Mental Status: Mental status is at baseline.  Psychiatric:        Attention and Perception: She is inattentive.        Speech: She is noncommunicative.   Review of Systems  Unable to perform ROS: Psychiatric disorder  Blood pressure 107/78, pulse 71, temperature 97.8 F (36.6 C), temperature source Axillary, resp. rate 18, height 5\' 3"  (1.6 m), weight 60.8 kg, SpO2 98 %. Body mass index is 23.74 kg/m.  Treatment Plan Summary: Medication management and Plan having a dramatic improvement even if it only lasts for a few hours after a first ECT treatment is a very good prognostic sign for recovery.  Plan will be to continue electroconvulsive therapy next treatment  scheduled for tomorrow.  I informed the patient of all of this on the hope that she could be able to understand it.  Patient also needs to be on some medication obviously now for what is probably the underlying psychosis.  Olanzapine was started last night oral dissolving at night.  Orders will be placed for her to be n.p.o. tonight with ECT tomorrow  Disposition:  Continue treatment plan  Alethia Berthold, MD 10/12/2021 3:42 PM

## 2021-10-12 NOTE — Telephone Encounter (Signed)
Called and left voicemail for patient to call back to be scheduled. 

## 2021-10-12 NOTE — Anesthesia Postprocedure Evaluation (Signed)
Anesthesia Post Note  Patient: Karen [name]  Procedure(s) Performed: ECT TX  Patient location during evaluation: PACU Anesthesia Type: General Level of consciousness: awake and alert Pain management: pain level controlled Vital Signs Assessment: post-procedure vital signs reviewed and stable Respiratory status: spontaneous breathing, nonlabored ventilation, respiratory function stable and patient connected to nasal cannula oxygen Cardiovascular status: blood pressure returned to baseline and stable Postop Assessment: no apparent nausea or vomiting Anesthetic complications: no   No notable events documented.   Last Vitals:  Vitals:   10/11/21 1445 10/11/21 1455  BP: 100/79 112/70  Pulse: 93 78  Resp: 20 (!) 22  Temp: 36.9 C 36.7 C  SpO2: 98%     Last Pain:  Vitals:   10/11/21 1455  TempSrc: Temporal  PainSc:                  Precious Haws Calani Gick

## 2021-10-12 NOTE — Progress Notes (Signed)
[facility] NAME: Karen [name]    MR#:  [id number]  DATE OF BIRTH:  Jan 22, 1976  SUBJECTIVE:  Patient eyes closed. No communication. Getting IV fluids. Not much interaction patient got her first ECT treatment yesterday. She was interactive for sure. Of time thereafter. Drink some water. Did not eat much.   VITALS:  Blood pressure 107/78, pulse 71, temperature 97.8 F (36.6 C), temperature source Axillary, resp. rate 18, height 5\' 3"  (1.6 m), weight 60.8 kg, SpO2 98 %.  PHYSICAL EXAMINATION:   GENERAL:  46 y.o.-year-old patient lying in the bed with no acute distress. disheveled LUNGS: Normal breath sounds bilaterally CARDIOVASCULAR: S1, S2 normal. NEUROLOGIC: patient is asleep, nonverbal limited exam due to patient participation LABORATORY PANEL:  CBC Recent Labs  Lab 10/06/21 0459  WBC 9.4  HGB 12.0  HCT 36.6  PLT 269     Chemistries  Recent Labs  Lab 10/11/21 0402 10/12/21 0459  NA 141 144  K 3.5 3.5  CL 105 109  CO2 22 26  GLUCOSE 96 124*  BUN 6 6  CREATININE 0.75 0.85  CALCIUM 9.0 9.3  MG 2.2  --     Cardiac Enzymes No results for input(s): TROPONINI in the last 168 hours. RADIOLOGY:  No results found.  Assessment and Plan Karen [name] is a 46 y.o. female with medical history significant of altered mental status, takes, psychosis, Ehlers-Danlos syndrome, fibromyalgia presenting by EMS for lying on the floor for last several days and not moving Per ED providers note.  Patient unable to provide history no family at bedside.  history of paranoid psychosis with hallucinations and delusions/catatonia medication noncompliance severe major depression Depression and anxiety: -- discussed with Dr. [name] --plans for ECT  -- cardiology clearance obtained from Dr. [name] for ECT --s/p ECT first treatment on 10/11/21- --started on Zyprexa  Sepsis secondary to UTI -Patient started on Rocephin x 3  days-- completed -- urine cultures pending -- blood cultures negative so far -- lactic acid levels 2.2 --received IVF per sepsis protocol --sepsis resolved   Elevated troponin w/o cardiac history -appears demand ischemia likely due to sepsis. --pt unable to give any history or ROS --troponin remains flat ---echo unremarkable -- seen by Dr. [name] cleared for ECT    Failure to thrive/significant weight loss of more than 30 pounds per mother Nutrition Status: Nutrition Problem: Moderate Malnutrition Etiology: chronic illness (psychiatric illness) Signs/Symptoms: mild fat depletion, moderate fat depletion, mild muscle depletion, moderate muscle depletion Interventions: Refer to RD note for recommendations, MVI  -- overall seems to have a poor long-term prognosis -- spoke with patient's mother Jorene Guest over the phone yesterday. Recommended dietitian recommendations and mother is okay with placing NG tube and starting nutrition. --2/23--pt pulled out NG tubes x 2 even with mitts on--will not be placing it. --hopefully will eat if she improved with ECT    Thyroid nodule: - thyroid ultrasound Diffusely heterogeneous and enlarged thyroid gland most consistent with diffuse goitrous change.  Recommend follow-up ultrasound in 1 year.  -- Patient will follow-up with PCP   palliative care discussed with mother at length. Patient is full code. Mother wants full scope of treatment.   patient overall has a very poor prognosis.      Family communication  mother on the phone 2/27 Consults : psychiatry, cardiology palliative care CODE STATUS: full DVT Prophylaxis : heparin Level of care: Med-Surg Status is: Inpatient Remains inpatient appropriate because: failure  to thrive, UTI, paranoia/catatonia Psychiatry plans for ECT  treatment          TOTAL TIME TAKING CARE OF THIS PATIENT: 20 minutes.  >50% time spent on counselling and coordination of care  Note: This dictation was  prepared with Dragon dictation along with smaller phrase technology. Any transcriptional errors that result from this process are unintentional.  Fritzi Mandes M.D    [facility]   CC: Primary care physician; Birdie Sons, MD

## 2021-10-13 ENCOUNTER — Inpatient Hospital Stay (HOSPITAL_COMMUNITY)
Admit: 2021-10-13 | Discharge: 2021-10-13 | Disposition: A | Discharge status: Home / Self Care | Payer: Medicare Other | Attending: Internal Medicine | Admitting: Internal Medicine

## 2021-10-13 ENCOUNTER — Inpatient Hospital Stay: Payer: Medicare Other | Admitting: Certified Registered"

## 2021-10-13 DIAGNOSIS — E440 Moderate protein-calorie malnutrition: Secondary | ICD-10-CM

## 2021-10-13 DIAGNOSIS — A419 Sepsis, unspecified organism: Principal | ICD-10-CM

## 2021-10-13 DIAGNOSIS — F333 Major depressive disorder, recurrent, severe with psychotic symptoms: Secondary | ICD-10-CM

## 2021-10-13 DIAGNOSIS — E041 Nontoxic single thyroid nodule: Secondary | ICD-10-CM

## 2021-10-13 DIAGNOSIS — R4182 Altered mental status, unspecified: Secondary | ICD-10-CM | POA: Diagnosis not present

## 2021-10-13 DIAGNOSIS — F321 Major depressive disorder, single episode, moderate: Secondary | ICD-10-CM | POA: Diagnosis not present

## 2021-10-13 DIAGNOSIS — F061 Catatonic disorder due to known physiological condition: Secondary | ICD-10-CM | POA: Diagnosis not present

## 2021-10-13 MED ORDER — METHOHEXITAL SODIUM 100 MG/10ML IV SOSY
PREFILLED_SYRINGE | INTRAVENOUS | Status: DC | PRN
  Administered 2021-10-13: 60 mg via INTRAVENOUS

## 2021-10-13 MED ORDER — MIDAZOLAM HCL 2 MG/2ML IJ SOLN
INTRAMUSCULAR | Status: AC
  Filled 2021-10-13: qty 2

## 2021-10-13 MED ORDER — MIDAZOLAM HCL 2 MG/2ML IJ SOLN
INTRAMUSCULAR | Status: DC | PRN
  Administered 2021-10-13: 2 mg via INTRAVENOUS

## 2021-10-13 MED ORDER — METHOHEXITAL SODIUM 0.5 G IJ SOLR
INTRAMUSCULAR | Status: AC
  Filled 2021-10-13: qty 500

## 2021-10-13 MED ORDER — SUCCINYLCHOLINE CHLORIDE 200 MG/10ML IV SOSY
PREFILLED_SYRINGE | INTRAVENOUS | Status: DC | PRN
  Administered 2021-10-13: 60 mg via INTRAVENOUS

## 2021-10-13 MED ORDER — MIDAZOLAM HCL 2 MG/2ML IJ SOLN: 2.0000 mg | Freq: Once | INTRAMUSCULAR | Status: DC

## 2021-10-13 MED ORDER — SODIUM CHLORIDE 0.9 % IV SOLN: 500.0000 mL | Freq: Once | INTRAVENOUS | Status: DC

## 2021-10-13 NOTE — Progress Notes (Signed)
Nutrition Follow-up ? ?DOCUMENTATION CODES:  ? ?Non-severe (moderate) malnutrition in context of chronic illness ? ?INTERVENTION:  ? ?-Continue Mighty Shake TID with meals, each supplement provides 220 kcals and 6 grams ? ?NUTRITION DIAGNOSIS:  ? ?Moderate Malnutrition related to chronic illness (psychiatric illness) as evidenced by mild fat depletion, moderate fat depletion, mild muscle depletion, moderate muscle depletion. ? ?Ongoing ? ?GOAL:  ? ?Patient will meet greater than or equal to 90% of their needs ? ?Progressing  ? ?MONITOR:  ? ?PO intake, Supplement acceptance, Labs, Weight trends, Skin, I & O's ? ?REASON FOR ASSESSMENT:  ? ?Consult ?Assessment of nutrition requirement/status ? ?ASSESSMENT:  ? ?Karen [name] is a 46 y.o. female with medical history significant of altered mental status, takes, psychosis, Ehlers-Danlos syndrome, fibromyalgia presenting by EMS for lying on the floor for last several days and not moving Per ED providers note.  Patient unable to provide history no family at bedside.  Patient has a history of psychotic episodes in the past patient has been noted to be urinating and defecating on herself per ED note.  The admission requested for urinary tract infection. ? ?2/27- ECT treatments initiated ?  ?Pt unavailable at time of visit. Attempted to speak with pt via call to hospital room phone, however, unable to reach.  ? ?Per chart review, pt more interactive after ECT treatments. No meal completion data available to assess at this time.  ? ?Plan for another ECT treatment today ? ?Palliative care following; pt and family still desire aggressive care.  ? ?Medications reviewed.  ? ?Labs reviewed: CBGS: 62-131.   ? ?Diet Order:   ?Diet Order   ? ?       ?  Diet NPO time specified  Diet effective midnight       ?  ? ?  ?  ? ?  ? ? ?EDUCATION NEEDS:  ? ?Not appropriate for education at this time ? ?Skin:  Skin Assessment: Reviewed RN Assessment ? ?Last BM:  Unknown ? ?Height:  ? ?Ht  Readings from Last 1 Encounters:  ?10/04/21 5\' 3"  (1.6 m)  ? ? ?Weight:  ? ?Wt Readings from Last 1 Encounters:  ?10/04/21 60.8 kg  ? ? ?Ideal Body Weight:  52.3 kg ? ?BMI:  Body mass index is 23.74 kg/m?. ? ?Estimated Nutritional Needs:  ? ?Kcal:  1800-2000 ? ?Protein:  90-105 grams ? ?Fluid:  > 1.8 L ? ? ? ?Loistine Chance, RD, LDN, CDCES ?Registered Dietitian II ?Certified Diabetes Care and Education Specialist ?Please refer to [facility] for RD and/or RD on-call/weekend/after hours pager  ?

## 2021-10-13 NOTE — Transfer of Care (Signed)
Immediate Anesthesia Transfer of Care Note ? ?Patient: Karen [name] ? ?Procedure(s) Performed: ECT TX ? ?Patient Location: PACU ? ?Anesthesia Type:General ? ?Level of Consciousness: awake ? ?Airway & Oxygen Therapy: Patient Spontanous Breathing ? ?Post-op Assessment: Report given to RN and Post -op Vital signs reviewed and stable ? ?Post vital signs: Reviewed and stable ? ?Last Vitals:  ?Vitals Value Taken Time  ?BP 112/89 10/13/21 1435  ?Temp    ?Pulse 88 10/13/21 1437  ?Resp 23 10/13/21 1437  ?SpO2 94 % 10/13/21 1437  ?Vitals shown include unvalidated device data. ? ?Last Pain:  ?Vitals:  ? 10/13/21 1400  ?TempSrc: Tympanic  ?PainSc: 0-No pain  ?   ? ?  ? ?Complications: No notable events documented. ?

## 2021-10-13 NOTE — Telephone Encounter (Signed)
Contacting referring provider due to multiple attempts to reach patient were unsuccessful

## 2021-10-13 NOTE — TOC Progression Note (Signed)
Transition of Care (TOC) - Progression Note  ? ? ?Patient Details  ?Name: Karen [name] ?MRN: [id number] ?Date of Birth: 1976/06/03 ? ?Transition of Care (TOC) CM/SW Contact  ?Pete Pelt, RN ?Phone Number: ?10/13/2021, 10:20 AM ? ?Clinical Narrative:   Continued ECT treatments, plans are still to discharge with [facility] home health when medically stable. ? ? ? ?Expected Discharge Plan:  (Home with parents as caregivers) ?Barriers to Discharge: Continued Medical Work up ? ?Expected Discharge Plan and Services ?Expected Discharge Plan:  (Home with parents as caregivers) ?  ?Discharge Planning Services: CM Consult ?  ?Living arrangements for the past 2 months: [place] ?                ?  ?  ?  ?  ?  ?  ?  ?  ?  ?  ? ? ?Social Determinants of Health (SDOH) Interventions ?  ? ?Readmission Risk Interventions ?No flowsheet data found. ? ?

## 2021-10-13 NOTE — H&P (Signed)
Karen [name] is an 46 y.o. female.   ?Chief Complaint: Patient has no complaint currently because she is not responsive ?HPI: History of what sounds like catatonia has ruled else of progressive psychotic symptoms ? ?Past Medical History:  ?Diagnosis Date  ? Ehlers-Danlos syndrome   ? Fibromyalgia   ? Headache   ? Sleep apnea   ? Tics of organic origin   ? Transient alteration of awareness   ? ? ?Past Surgical History:  ?Procedure Laterality Date  ? COLONOSCOPY WITH PROPOFOL N/A 02/13/2018  ? Procedure: COLONOSCOPY WITH PROPOFOL;  Surgeon: [name], MD;  Location: [facility] ENDOSCOPY;  Service: Endoscopy;  Laterality: N/A;  ? [place] OF UTERUS  2009  ? ESOPHAGOGASTRODUODENOSCOPY (EGD) WITH PROPOFOL  02/13/2018  ? Procedure: ESOPHAGOGASTRODUODENOSCOPY (EGD) WITH PROPOFOL;  Surgeon: [name], MD;  Location: [facility] ENDOSCOPY;  Service: Endoscopy;;  ? EYE SURGERY Left 1990  ? 3 Surgeries on left eye to correct crossed eye  ? LAPAROSCOPY Left 2003  ? Fallopian Tube  ? [place]  ? OTHER SURGICAL HISTORY Right 1987  ? 3 Surgeries to repair broken arm  ? OTHER SURGICAL HISTORY Left   ? 3 surgeries on her left thigh as an infant  ? OTHER SURGICAL HISTORY  1998  ? Jaw surgery  ? Right arm surgery  1987  ? x 3 due to fracture  ? TOENAIL EXCISION Bilateral 06/13/2019  ? Procedure: BILATERAL SECOND TOE PARTIAL NAIL ABLATION;  Surgeon: Erle Crocker, MD;  Location: [place];  Service: Orthopedics;  Laterality: Bilateral;  SURGERY REQUEST TIME 1 HOUR  ? TONSILLECTOMY  12/13/2002  ? ? ?Family History  ?Problem Relation Age of Onset  ? Depression Mother   ? Stroke Mother   ? Fibromyalgia Mother   ? Osteoarthritis Mother   ? Asthma Brother   ? Depression Brother   ? Migraines Brother   ? Asperger's syndrome Brother   ? Other Brother   ?     Ehlers-Danlos Syndrome  ? Cancer Maternal Aunt   ? Asperger's syndrome Maternal Aunt   ? Breast cancer Maternal Aunt   ? Heart disease  Paternal Aunt   ? Heart disease Paternal Uncle   ? Cervical cancer Maternal Grandmother   ? Osteoporosis Maternal Grandmother   ?     Died at 44  ? Osteoarthritis Maternal Grandmother   ? Colon cancer Maternal Grandfather   ? Asthma Maternal Grandfather   ? Asperger's syndrome Maternal Grandfather   ? Emphysema Maternal Grandfather   ?     Died at 14  ? Prostate cancer Maternal Grandfather   ? Heart attack Paternal Grandfather   ?     Died at 72  ? Asthma Paternal Grandfather   ? Tuberculosis Paternal Grandfather   ? Cancer Cousin   ? Asperger's syndrome Cousin   ? Asperger's syndrome Other   ? Heart Problems Paternal Grandmother   ?     Died at 21  ? Ehlers-Danlos syndrome Father   ? Ehlers-Danlos syndrome Brother   ? ?Social History:  reports that she has never smoked. She has never used smokeless tobacco. She reports that she does not drink alcohol and does not use drugs. ? ?Allergies:  ?Allergies  ?Allergen Reactions  ? Augmentin  [Amoxicillin-Pot Clavulanate] Diarrhea  ? Chocolate Flavor   ?  Other reaction(s): Other (See Comments) ?Wheezing, acne  ? Demerol [Meperidine] Other (See Comments)  ?  Slow to wake up when  this drug is given.   ? Keflex [Cephalexin] Nausea And Vomiting  ? Morphine And Related Nausea And Vomiting  ? Tape Dermatitis  ?  Must use paper tape only  ? Toradol [Ketorolac Tromethamine] Nausea And Vomiting  ? Amoxicillin   ?  Other reaction(s): Unknown  ? Chocolate   ?  GI distress  ? Nsaids   ?  patient develops ulcers ?Other reaction(s): Other (See Comments) ?patient develops ulcers  ? Strawberry Extract   ?  GI distress  ? ? ?(Not in a hospital admission) ? ? ?Results for orders placed or performed during the hospital encounter of 10/04/21 (from the past 48 hour(s))  ?Basic metabolic panel     Status: Abnormal  ? Collection Time: 10/12/21  4:59 AM  ?Result Value Ref Range  ? Sodium 144 135 - 145 mmol/L  ? Potassium 3.5 3.5 - 5.1 mmol/L  ? Chloride 109 98 - 111 mmol/L  ? CO2 26 22 - 32  mmol/L  ? Glucose, Bld 124 (H) 70 - 99 mg/dL  ?  Comment: Glucose reference range applies only to samples taken after fasting for at least 8 hours.  ? BUN 6 6 - 20 mg/dL  ? Creatinine, Ser 0.85 0.44 - 1.00 mg/dL  ? Calcium 9.3 8.9 - 10.3 mg/dL  ? GFR, Estimated >60 >60 mL/min  ?  Comment: (NOTE) ?Calculated using the CKD-EPI Creatinine Equation (2021) ?  ? Anion gap 9 5 - 15  ?  Comment: Performed at [facility], [address], [place], [place] [address]  ? ?No results found. ? ?Review of Systems  ?Unable to perform ROS: Psychiatric disorder  ? ?Blood pressure 112/89, pulse 88, temperature (!) 97.2 ?F (36.2 ?C), resp. rate (!) 21, height 5\' 3"  (1.6 m), weight 60.8 kg, SpO2 99 %. ?Physical Exam ?Vitals reviewed.  ?Constitutional:   ?   Appearance: She is well-developed. She is ill-appearing.  ?HENT:  ?   Head: Normocephalic and atraumatic.  ?Eyes:  ?   Conjunctiva/sclera: Conjunctivae normal.  ?   Pupils: Pupils are equal, round, and reactive to light.  ?Cardiovascular:  ?   Heart sounds: Normal heart sounds.  ?Pulmonary:  ?   Effort: Pulmonary effort is normal.  ?Abdominal:  ?   Palpations: Abdomen is soft.  ?Musculoskeletal:     ?   General: Normal range of motion.  ?   Cervical back: Normal range of motion.  ?Skin: ?   General: Skin is warm and dry.  ?Psychiatric:     ?   Attention and Perception: She is inattentive.     ?   Speech: She is noncommunicative.  ?  ? ?Assessment/Plan ?ECT today treatment #2 continue index course for catatonia ? ?Alethia Berthold, MD ?10/13/2021, 4:42 PM ? ? ? ?

## 2021-10-13 NOTE — Consult Note (Signed)
PHARMACY CONSULT NOTE ? ?Pharmacy Consult for Electrolyte Monitoring and Replacement  ? ?Recent Labs: ?Potassium (mmol/L)  ?Date Value  ?10/12/2021 3.5  ?02/19/2014 3.9  ? ?Magnesium (mg/dL)  ?Date Value  ?10/11/2021 2.2  ? ?Calcium (mg/dL)  ?Date Value  ?10/12/2021 9.3  ? ?Calcium, Total (mg/dL)  ?Date Value  ?02/19/2014 9.0  ? ?Albumin (g/dL)  ?Date Value  ?10/04/2021 3.6  ?10/15/2019 4.5  ?02/19/2014 3.6  ? ?Phosphorus (mg/dL)  ?Date Value  ?10/11/2021 4.2  ? ?Sodium (mmol/L)  ?Date Value  ?10/12/2021 144  ?10/15/2019 140  ?02/19/2014 140  ? ?Assessment: ?Pharmacy has been consulted to monitor and replace electrolytes in 45yo patient admitted with sepsis secondary to UTI. She is currently in a catatonic state and per psychology, will start patient on ECT. Patient with significant weight loss of more than 30 pounds. Dietician also following. ?-failure to thrive ? ?Patient pulled out NG tube x 2 on 2/23- not planning on replacing ? ?Diet: NPO ?MIVF: D5-1/2NS + 20 mEq K/L at 75 cc/hr ? ?Goal of Therapy:  ?Electrolytes within normal limits ? ?Plan:  ?--Will follow-up with labs as ordered by primary team for now ?--Once patient tolerating diet; closer monitoring may be indicated to watch for re-feeding ?--Will continue to follow along ? ?Benita Gutter ?10/13/2021 7:19 AM ? ?

## 2021-10-13 NOTE — Anesthesia Preprocedure Evaluation (Signed)
Anesthesia Evaluation  ?Patient identified by MRN, date of birth, ID band ?Patient awake and Patient confused ? ? ? ?Reviewed: ?Allergy & Precautions, NPO status , Patient's Chart, lab work & pertinent test results, Unable to perform ROS - Chart review only ? ?Airway ?Mallampati: II ? ?TM Distance: >3 FB ?Neck ROM: full ? ? ? Dental ? ?(+) Chipped ?  ?Pulmonary ?neg pulmonary ROS, asthma , sleep apnea ,  ?  ?Pulmonary exam normal ?breath sounds clear to auscultation ? ? ? ? ? ? Cardiovascular ?Exercise Tolerance: Good ?negative cardio ROS ?Normal cardiovascular exam ?Rhythm:Regular  ? ?  ?Neuro/Psych ? Headaches, Seizures -,  Anxiety Depression negative neurological ROS ? negative psych ROS  ? GI/Hepatic ?negative GI ROS, Neg liver ROS,   ?Endo/Other  ?negative endocrine ROS ? Renal/GU ?negative Renal ROS  ?negative genitourinary ?  ?Musculoskeletal ? ?(+) Fibromyalgia - ? Abdominal ?Normal abdominal exam  (+)   ?Peds ?negative pediatric ROS ?(+)  Hematology ?negative hematology ROS ?(+)   ?Anesthesia Other Findings ?Past Medical History: ?No date: Ehlers-Danlos syndrome ?No date: Fibromyalgia ?No date: Headache ?No date: Sleep apnea ?No date: Tics of organic origin ?No date: Transient alteration of awareness ? ?Past Surgical History: ?02/13/2018: COLONOSCOPY WITH PROPOFOL; N/A ?    Comment:  Procedure: COLONOSCOPY WITH PROPOFOL;  Surgeon: Allen Norris,  ?             Darren, MD;  Location: [place];  Service:  ?             Endoscopy;  Laterality: N/A; ?2009: DILATION AND CURETTAGE OF UTERUS ?02/13/2018: ESOPHAGOGASTRODUODENOSCOPY (EGD) WITH PROPOFOL ?    Comment:  Procedure: ESOPHAGOGASTRODUODENOSCOPY (EGD) WITH  ?             PROPOFOL;  Surgeon: [name], MD;  Location: [facility]  ?             ENDOSCOPY;  Service: Endoscopy;; ?1990: EYE SURGERY; Left ?    Comment:  3 Surgeries on left eye to correct crossed eye ?2003: LAPAROSCOPY; Left ?    Comment:  Fallopian Tube ?1998: MANDIBLE  SURGERY ?1987: OTHER SURGICAL HISTORY; Right ?    Comment:  3 Surgeries to repair broken arm ?No date: OTHER SURGICAL HISTORY; Left ?    Comment:  3 surgeries on her left thigh as an infant ?1998: OTHER SURGICAL HISTORY ?    Comment:  Jaw surgery ?1987: Right arm surgery ?    Comment:  x 3 due to fracture ?06/13/2019: TOENAIL EXCISION; Bilateral ?    Comment:  Procedure: BILATERAL SECOND TOE PARTIAL NAIL ABLATION;   ?             Surgeon: Erle Crocker, MD;  Location: [place]  ?             SURGERY CENTER;  Service: Orthopedics;  Laterality:  ?             Bilateral;  SURGERY REQUEST TIME 1 HOUR ?12/13/2002: TONSILLECTOMY ? ? ? ? Reproductive/Obstetrics ?negative OB ROS ? ?  ? ? ? ? ? ? ? ? ? ? ? ? ? ?  ?  ? ? ? ? ? ? ? ? ?Anesthesia Physical ?Anesthesia Plan ? ?ASA: 2 ? ?Anesthesia Plan: General  ? ?Post-op Pain Management:   ? ?Induction: Intravenous ? ?PONV Risk Score and Plan: Propofol infusion and TIVA ? ?Airway Management Planned: Mask and Natural Airway ? ?Additional Equipment:  ? ?Intra-op Plan:  ? ?Post-operative Plan:  ? ?  Informed Consent: I have reviewed the patients History and Physical, chart, labs and discussed the procedure including the risks, benefits and alternatives for the proposed anesthesia with the patient or authorized representative who has indicated his/her understanding and acceptance.  ? ? ? ?Dental Advisory Given ? ?Plan Discussed with: CRNA and Surgeon ? ?Anesthesia Plan Comments:   ? ? ? ? ? ? ?Anesthesia Quick Evaluation ? ?

## 2021-10-13 NOTE — Progress Notes (Signed)
?PROGRESS NOTE ? ?Karen [name]    DOB: 26-Apr-1976, 46 y.o.  ?[id number]  ?  Code Status: Full Code   ?DOA: 10/04/2021   LOS: 8  ? ?Brief hospital course  ?Karen [name] is a 46 y.o. female with a PMH significant for Erler's Danlos syndrome, fibromyalgia, sleep apnea, transient alteration of awareness, tics of organic origin, headaches. ?They presented from home to the ED on 10/04/2021 with found down x several days days.  She was found lying on the floor covered with urine and feces ?In the ED, it was found that they had UTI and met sepsis criteria with, lactic acidosis.  ?They were treated with ceftriaxone and IV fluids.  ?Patient was admitted to medicine service for further workup and management of AMS as outlined in detail below. ? ?10/13/21 -stable, improved ? ?Assessment & Plan  ?Principal Problem: ?  Catatonia ?Active Problems: ?  Depression with anxiety ?  Seizure disorder ([place]) ?  Sepsis secondary to UTI [facility]) ?  AMS (altered mental status) ?  Thyroid nodule ?  Hypokalemia ?  Elevated troponin ?  Depression, major, recurrent, severe with psychosis ([place]) ?  Malnutrition of moderate degree ? ?History of paranoid psychosis with hallucinations and delusions/catatonia ?Severe major depression and anxiety-patient presented in catatonic state and is showing improvement after ECT. ?-Psychiatry following, appreciate recommendations ? -ECT 2/27- ? -Continue Zyprexa ? ?Sepsis-sepsis physiology has resolved.  S/p CTX for UTI ? ?Elevated troponins on admission-likely from demand ischemia.  No cardiac history, unable to assess if she had chest pain, echo unremarkable ? ?Failure to thrive-poor nutrition status due to not tolerating any p.o. and does not tolerate NG tube placement.  Remains n.p.o. prior to ECT treatments ?-Encourage p.o. intake ?-Continue IV fluids with dextrose until tolerating p.o. ? ?Thyroid nodule- thyroid ultrasound Diffusely heterogeneous and enlarged thyroid gland most consistent with  diffuse goitrous change. ? - Recommend follow-up ultrasound in 1 year ? ?Body mass index is 23.74 kg/m?. ? ?VTE ppx: enoxaparin (LOVENOX) injection 40 mg Start: 10/11/21 2200 ?SCDs Start: 10/04/21 2355 ? ? ?Diet:  ?   ?Diet  ? Diet NPO time specified  ? ?Subjective 10/13/21   ? ?Pt reports no complaints today.  She is alert and shows recognition that she hears me talking to her.  She shakes her head no when I asked her if her name is Karen [name]. ?  ?Objective  ? ?Vitals:  ? 10/12/21 0733 10/12/21 1714 10/12/21 2040 10/13/21 0418  ?BP: 107/78 105/84 109/75 107/85  ?Pulse: 71 86 72 77  ?Resp: _0 ?Temp: 97.8 ?F (36.6 ?C) 98.1 ?F (36.7 ?C) 98.2 ?F (36.8 ?C) 97.9 ?F (36.6 ?C)  ?TempSrc: Axillary Oral Oral Oral  ?SpO2: 98% 97% 100% 100%  ?Weight:      ?Height:      ? ? ?Intake/Output Summary (Last 24 hours) at 10/13/2021 3151 ?Last data filed at 10/12/2021 2248 ?Gross per 24 hour  ?Intake 1003.3 ml  ?Output --  ?Net 1003.3 ml  ? ?Filed Weights  ? 10/04/21 1900  ?Weight: 60.8 kg  ?  ? ?Physical Exam:  ?General: awake, alert, NAD ?HEENT: atraumatic, clear conjunctiva, anicteric sclera, MMM, hearing grossly normal ?Respiratory: normal respiratory effort. ?Cardiovascular: normal S1/S2, RRR, no JVD, murmurs, quick capillary refill  ?Nervous: Alert ?Skin: dry, intact, normal temperature, normal color. No rashes, lesions or ulcers on exposed skin ?Psychiatry: flat ? ?Labs   ?I have personally reviewed the following labs and imaging studies ?CBC ?   ?  Component Value Date/Time  ? WBC 9.4 10/06/2021 0459  ? RBC 4.35 10/06/2021 0459  ? HGB 12.0 10/06/2021 0459  ? HGB 12.8 10/15/2019 1023  ? HCT 36.6 10/06/2021 0459  ? HCT 37.9 10/15/2019 1023  ? PLT 269 10/06/2021 0459  ? PLT 256 10/15/2019 1023  ? MCV 84.1 10/06/2021 0459  ? MCV 85 10/15/2019 1023  ? MCV 92 02/19/2014 0035  ? MCH 27.6 10/06/2021 0459  ? MCHC 32.8 10/06/2021 0459  ? RDW 15.4 10/06/2021 0459  ? RDW 13.3 10/15/2019 1023  ? RDW 13.8 02/19/2014 0035  ?  LYMPHSABS 1.8 10/04/2021 1901  ? LYMPHSABS 2.7 10/15/2019 1023  ? LYMPHSABS 3.5 02/19/2014 0035  ? MONOABS 0.6 10/04/2021 1901  ? MONOABS 0.5 02/19/2014 0035  ? EOSABS 0.1 10/04/2021 1901  ? EOSABS 0.3 10/15/2019 1023  ? EOSABS 0.9 (H) 02/19/2014 0035  ? BASOSABS 0.0 10/04/2021 1901  ? BASOSABS 0.0 10/15/2019 1023  ? BASOSABS 0.1 02/19/2014 0035  ? ?BMP Latest Ref Rng & Units 10/12/2021 10/11/2021 10/10/2021  ?Glucose 70 - 99 mg/dL 124(H) 96 111(H)  ?BUN 6 - 20 mg/dL _0 ?Creatinine 0.44 - 1.00 mg/dL 0.85 0.75 0.57  ?BUN/Creat Ratio 9 - 23 - - -  ?Sodium 135 - 145 mmol/L 144 141 138  ?Potassium 3.5 - 5.1 mmol/L 3.5 3.5 3.2(L)  ?Chloride 98 - 111 mmol/L 109 105 105  ?CO2 22 - 32 mmol/L _1 ?Calcium 8.9 - 10.3 mg/dL 9.3 9.0 8.7(L)  ? ? ?No results found. ? ?Disposition Plan & Communication  ?Patient status: Inpatient  ?Admitted From: Home ?Planned disposition location: TBD ?Anticipated discharge date: TBD pending continuing ECT therapy ? ?Family Communication: none  ?  ?Author: ?Richarda Osmond, DO ?[facility] ?10/13/2021, 7:22 AM  ? ?Available by Epic secure chat 7AM-7PM. ?If 7PM-7AM, please contact night-coverage.  ?[facility] contact information found on [email or web address] ? ?

## 2021-10-13 NOTE — Plan of Care (Signed)
?  Problem: Education: ?Goal: Knowledge of General Education information will improve ?Description: Including pain rating scale, medication(s)/side effects and non-pharmacologic comfort measures ?Outcome: Progressing ?  ?Problem: Health Behavior/Discharge Planning: ?Goal: Ability to manage health-related needs will improve ?Outcome: Progressing ?  ?Problem: Clinical Measurements: ?Goal: Ability to maintain clinical measurements within normal limits will improve ?Outcome: Progressing ?Goal: Will remain free from infection ?Outcome: Progressing ?Goal: Diagnostic test results will improve ?Outcome: Progressing ?Goal: Respiratory complications will improve ?Outcome: Progressing ?Goal: Cardiovascular complication will be avoided ?Outcome: Progressing ?  ?Problem: Activity: ?Goal: Risk for activity intolerance will decrease ?Outcome: Progressing ?  ?Problem: Nutrition: ?Goal: Adequate nutrition will be maintained ?Outcome: Progressing ?  ?Problem: Coping: ?Goal: Level of anxiety will decrease ?Outcome: Progressing ?  ?Problem: Elimination: ?Goal: Will not experience complications related to bowel motility ?Outcome: Progressing ?Goal: Will not experience complications related to urinary retention ?Outcome: Progressing ?  ?Problem: Pain Managment: ?Goal: General experience of comfort will improve ?Outcome: Progressing ?  ?Problem: Safety: ?Goal: Ability to remain free from injury will improve ?Outcome: Progressing ?  ?Problem: Skin Integrity: ?Goal: Risk for impaired skin integrity will decrease ?Outcome: Progressing ?  ?Problem: Education: ?Goal: Knowledge of disease or condition will improve ?Outcome: Progressing ?Goal: Knowledge of secondary prevention will improve (SELECT ALL) ?Outcome: Progressing ?Goal: Knowledge of patient specific risk factors will improve (INDIVIDUALIZE FOR PATIENT) ?Outcome: Progressing ?  ?

## 2021-10-13 NOTE — Procedures (Signed)
ECT SERVICES ?Physician?s Interval Evaluation & Treatment Note ? ?Patient Identification: Karen [name] ?MRN:  [id number] ?Date of Evaluation:  10/13/2021 ?[place] #: 2 ? ?MADRS:  ? ?MMSE:  ? ?P.E. Findings: ? ?Patient remains catatonic very withdrawn and very dry mucous membranes ? ?Psychiatric Interval Note: ? ?Unresponsive catatonic presentation ? ?Subjective: ? ?Patient is a 46 y.o. female seen for evaluation for Electroconvulsive Therapy. ?Not responsive ? ?Treatment Summary: ? ? []   Right Unilateral             [x]  Bilateral ?  ?% Energy : 1.0 ms 40% ? ? Impedance: 2140 ohms ? ?Seizure Energy Index: 50,518 ?V squared ? ?Postictal Suppression Index: 42% ? ?Seizure Concordance Index: 93% ? ?Medications ? ?Pre Shock: Brevital 60 mg succinylcholine 60 mg ? ?Post Shock: Versed 2 mg EMG 42 seconds EEG 145 seconds ? ?Seizure Duration: EEG 42 seconds EEG 145 seconds ? ? ?Comments: ?Treatment without any complication.  Follow up with continued index course ? ?Lungs: ? ?[x]   Clear to auscultation              ? ?[]  Other:  ? ?Heart: ?  ? [x]   Regular rhythm             []  irregular rhythm ? ? ? [x]   Previous H&P reviewed, patient examined and there are NO CHANGES              ?  ? []   Previous H&P reviewed, patient examined and there are changes noted. ? ? ?Alethia Berthold, MD ?3/1/20234:43 PM ? ?  ?

## 2021-10-14 ENCOUNTER — Other Ambulatory Visit: Payer: Self-pay | Admitting: Psychiatry

## 2021-10-14 DIAGNOSIS — R4182 Altered mental status, unspecified: Secondary | ICD-10-CM | POA: Diagnosis not present

## 2021-10-14 DIAGNOSIS — E876 Hypokalemia: Secondary | ICD-10-CM

## 2021-10-14 DIAGNOSIS — R778 Other specified abnormalities of plasma proteins: Secondary | ICD-10-CM

## 2021-10-14 DIAGNOSIS — F061 Catatonic disorder due to known physiological condition: Secondary | ICD-10-CM | POA: Diagnosis not present

## 2021-10-14 DIAGNOSIS — F333 Major depressive disorder, recurrent, severe with psychotic symptoms: Secondary | ICD-10-CM | POA: Diagnosis not present

## 2021-10-14 LAB — GLUCOSE, CAPILLARY: Glucose-Capillary: 124 mg/dL — ABNORMAL HIGH (ref 70–99)

## 2021-10-14 NOTE — Progress Notes (Signed)
?PROGRESS NOTE ? ?Karen [name]    DOB: [birth year], 46 y.o.  ?[id number]  ?  Code Status: Full Code   ?DOA: 10/04/2021   LOS: 9  ? ?Brief hospital course  ?Karen [name] is a 46 y.o. female with a PMH significant for Erler's Danlos syndrome, fibromyalgia, sleep apnea, transient alteration of awareness, tics of organic origin, headaches. ?They presented from home to the ED on 10/04/2021 with found down x several days days.  She was found lying on the floor covered with urine and feces ?In the ED, it was found that they had UTI and met sepsis criteria with, lactic acidosis.  ?They were treated with ceftriaxone and IV fluids.  ?Patient was admitted to medicine service for further workup and management of AMS as outlined in detail below. ? ?10/14/21 -stable, improved ? ?Assessment & Plan  ?Principal Problem: ?  Catatonia ?Active Problems: ?  Depression with anxiety ?  Seizure disorder ([place]) ?  Sepsis secondary to UTI [facility]) ?  AMS (altered mental status) ?  Thyroid nodule ?  Hypokalemia ?  Elevated troponin ?  Depression, major, recurrent, severe with psychosis ([place]) ?  Malnutrition of moderate degree ? ?History of paranoid psychosis with hallucinations and delusions/catatonia ?Severe major depression and anxiety-patient presented in catatonic state and is showing improvement after ECT. ?-Psychiatry following, appreciate recommendations ? -ECT 2/27- ? -Continue Zyprexa ? ?Sepsis-sepsis physiology has resolved.  S/p CTX for UTI ? ?Elevated troponins on admission-likely from demand ischemia.  No cardiac history, unable to assess if she had chest pain, echo unremarkable. Cardiology evaluated ? ?Failure to thrive-poor nutrition status due to not tolerating any p.o. and does not tolerate NG tube placement.  Remains n.p.o. prior to ECT treatments, ate 100% of dinner last night. ?-Encourage p.o. intake ?-Continue IV fluids with dextrose until tolerating p.o. ? ?Thyroid nodule- thyroid ultrasound Diffusely heterogeneous  and enlarged thyroid gland most consistent with diffuse goitrous change. ? - Recommend follow-up ultrasound in 1 year ? ?Body mass index is 23.74 kg/m?. ? ?VTE ppx: enoxaparin (LOVENOX) injection 40 mg Start: 10/11/21 2200 ?SCDs Start: 10/04/21 2355 ? ? ?Diet:  ?   ?Diet  ? Diet regular Room service appropriate? Yes; Fluid consistency: Thin  ? ?Subjective 10/14/21   ? ?Pt reports no complaints today as she would not wake up for conversation.  ?  ?Objective  ? ?Vitals:  ? 10/13/21 0739 10/13/21 1537 10/13/21 2004 10/14/21 0433  ?BP: 106/83 (!) 112/94 98/80 107/82  ?Pulse: 76 80 100 90  ?Resp:  '20 19 17  ' ?Temp:  98.1 ?F (36.7 ?C) 98 ?F (36.7 ?C) 98 ?F (36.7 ?C)  ?TempSrc:  Oral    ?SpO2:  97% 99% 99%  ?Weight:      ?Height:      ? ? ?Intake/Output Summary (Last 24 hours) at 10/14/2021 0717 ?Last data filed at 10/14/2021 0600 ?Gross per 24 hour  ?Intake 2643.63 ml  ?Output 525 ml  ?Net 2118.63 ml  ? ? ?Filed Weights  ? 10/04/21 1900  ?Weight: 60.8 kg  ?  ? ?Physical Exam:  ?General: asleep, NAD ?HEENT: atraumatic, clear conjunctiva, anicteric sclera, MMM ?Respiratory: normal respiratory effort. ?Cardiovascular: normal S1/S2, RRR, no JVD, murmurs, quick capillary refill  ?Nervous: sleeping and not responding to voice or touch ?Skin: dry, intact, normal temperature, normal color. No rashes, lesions or ulcers on exposed skin ? ?Labs   ?I have personally reviewed the following labs and imaging studies ?CBC ?   ?Component Value Date/Time  ?  WBC 9.4 10/06/2021 0459  ? RBC 4.35 10/06/2021 0459  ? HGB 12.0 10/06/2021 0459  ? HGB 12.8 10/15/2019 1023  ? HCT 36.6 10/06/2021 0459  ? HCT 37.9 10/15/2019 1023  ? PLT 269 10/06/2021 0459  ? PLT 256 10/15/2019 1023  ? MCV 84.1 10/06/2021 0459  ? MCV 85 10/15/2019 1023  ? MCV 92 02/19/2014 0035  ? MCH 27.6 10/06/2021 0459  ? MCHC 32.8 10/06/2021 0459  ? RDW 15.4 10/06/2021 0459  ? RDW 13.3 10/15/2019 1023  ? RDW 13.8 02/19/2014 0035  ? LYMPHSABS 1.8 10/04/2021 1901  ? LYMPHSABS 2.7  10/15/2019 1023  ? LYMPHSABS 3.5 02/19/2014 0035  ? MONOABS 0.6 10/04/2021 1901  ? MONOABS 0.5 02/19/2014 0035  ? EOSABS 0.1 10/04/2021 1901  ? EOSABS 0.3 10/15/2019 1023  ? EOSABS 0.9 (H) 02/19/2014 0035  ? BASOSABS 0.0 10/04/2021 1901  ? BASOSABS 0.0 10/15/2019 1023  ? BASOSABS 0.1 02/19/2014 0035  ? ?BMP Latest Ref Rng & Units 10/12/2021 10/11/2021 10/10/2021  ?Glucose 70 - 99 mg/dL 124(H) 96 111(H)  ?BUN 6 - 20 mg/dL '6 6 7  ' ?Creatinine 0.44 - 1.00 mg/dL 0.85 0.75 0.57  ?BUN/Creat Ratio 9 - 23 - - -  ?Sodium 135 - 145 mmol/L 144 141 138  ?Potassium 3.5 - 5.1 mmol/L 3.5 3.5 3.2(L)  ?Chloride 98 - 111 mmol/L 109 105 105  ?CO2 22 - 32 mmol/L '26 22 25  ' ?Calcium 8.9 - 10.3 mg/dL 9.3 9.0 8.7(L)  ? ? ?No results found. ? ?Disposition Plan & Communication  ?Patient status: Inpatient  ?Admitted From: Home ?Planned disposition location: TBD ?Anticipated discharge date: TBD pending continuing ECT therapy, rough estimate would be 3/9 and might need inpatient psychiatry admission ? ?Family Communication: none  ?  ?Author: ?Richarda Osmond, DO ?[facility] ?10/14/2021, 7:17 AM  ? ?Available by Epic secure chat 7AM-7PM. ?If 7PM-7AM, please contact night-coverage.  ?[facility] contact information found on [email or web address] ? ?

## 2021-10-14 NOTE — Plan of Care (Signed)
?  Problem: Education: ?Goal: Knowledge of General Education information will improve ?Description: Including pain rating scale, medication(s)/side effects and non-pharmacologic comfort measures ?Outcome: Progressing ?  ?Problem: Health Behavior/Discharge Planning: ?Goal: Ability to manage health-related needs will improve ?Outcome: Progressing ?  ?Problem: Clinical Measurements: ?Goal: Ability to maintain clinical measurements within normal limits will improve ?Outcome: Progressing ?Goal: Will remain free from infection ?Outcome: Progressing ?Goal: Diagnostic test results will improve ?Outcome: Progressing ?Goal: Respiratory complications will improve ?Outcome: Progressing ?Goal: Cardiovascular complication will be avoided ?Outcome: Progressing ?  ?Problem: Activity: ?Goal: Risk for activity intolerance will decrease ?Outcome: Progressing ?  ?Problem: Nutrition: ?Goal: Adequate nutrition will be maintained ?Outcome: Progressing ?  ?Problem: Coping: ?Goal: Level of anxiety will decrease ?Outcome: Progressing ?  ?Problem: Elimination: ?Goal: Will not experience complications related to bowel motility ?Outcome: Progressing ?Goal: Will not experience complications related to urinary retention ?Outcome: Progressing ?  ?Problem: Pain Managment: ?Goal: General experience of comfort will improve ?Outcome: Progressing ?  ?Problem: Safety: ?Goal: Ability to remain free from injury will improve ?Outcome: Progressing ?  ?Problem: Skin Integrity: ?Goal: Risk for impaired skin integrity will decrease ?Outcome: Progressing ?  ?Problem: Education: ?Goal: Knowledge of disease or condition will improve ?Outcome: Progressing ?Goal: Knowledge of secondary prevention will improve (SELECT ALL) ?Outcome: Progressing ?Goal: Knowledge of patient specific risk factors will improve (INDIVIDUALIZE FOR PATIENT) ?Outcome: Progressing ?  ?

## 2021-10-14 NOTE — Progress Notes (Signed)
OT Cancellation Note ? ?Patient Details ?Name: Karen [name] ?MRN: [id number] ?DOB: 09-25-1975 ? ? ?Cancelled Treatment:    Reason Eval/Treat Not Completed: Fatigue/lethargy limiting ability to participate ?When OT attempted to evaluate patient this AM, she was unable to rouse despite multiple attempts and modes of stimuli including sternal rub. She was noted to have normal chest rise and fall, but just unable to awaken for occupational therapy participation. RN Rip Harbour notified. Will f/u at later date/time as able/appropriate for OT evaluation. Thank you. ? ?Gerrianne Scale, [place], OTR/L ?ascom [phone] ?10/14/21, 9:40 AM  ?

## 2021-10-14 NOTE — Anesthesia Postprocedure Evaluation (Signed)
Anesthesia Post Note ? ?Patient: Karen [name] ? ?Procedure(s) Performed: ECT TX ? ?Patient location during evaluation: PACU ?Anesthesia Type: General ?Level of consciousness: sedated ?Pain management: satisfactory to patient ?Vital Signs Assessment: post-procedure vital signs reviewed and stable ?Respiratory status: spontaneous breathing and respiratory function stable ?Cardiovascular status: stable ?Anesthetic complications: no ? ? ?No notable events documented. ? ? ?Last Vitals:  ?Vitals:  ? 10/13/21 1449 10/13/21 1500  ?BP:    ?Pulse: 93 88  ?Resp: 18 (!) 21  ?Temp:  (!) 36.2 ?C  ?SpO2: 100% 99%  ?  ?Last Pain:  ?Vitals:  ? 10/13/21 1500  ?TempSrc:   ?PainSc: 0-No pain  ? ? ?  ?  ?  ?  ?  ?  ? ?VAN STAVEREN,Laurelyn Terrero ? ? ? ? ?

## 2021-10-14 NOTE — Progress Notes (Signed)
PT Cancellation Note ? ?Patient Details ?Name: Karen [name] ?MRN: [id number] ?DOB: 04-Oct-1975 ? ? ?Cancelled Treatment:    Reason Eval/Treat Not Completed: Other (comment).  PT consult received.  Chart reviewed.  Nurse reports pt has been sleeping this morning.  Upon PT arrival to pt's room, pt soundly sleeping in bed and did not wake to cueing.  Will re-attempt PT evaluation at a later date/time. ? ? ?Raquel Sarna Farah Benish ?10/14/2021, 9:57 AM ?

## 2021-10-14 NOTE — Consult Note (Signed)
PHARMACY CONSULT NOTE ? ?Pharmacy Consult for Electrolyte Monitoring and Replacement  ? ?Recent Labs: ?Potassium (mmol/L)  ?Date Value  ?10/12/2021 3.5  ?02/19/2014 3.9  ? ?Magnesium (mg/dL)  ?Date Value  ?10/11/2021 2.2  ? ?Calcium (mg/dL)  ?Date Value  ?10/12/2021 9.3  ? ?Calcium, Total (mg/dL)  ?Date Value  ?02/19/2014 9.0  ? ?Albumin (g/dL)  ?Date Value  ?10/04/2021 3.6  ?10/15/2019 4.5  ?02/19/2014 3.6  ? ?Phosphorus (mg/dL)  ?Date Value  ?10/11/2021 4.2  ? ?Sodium (mmol/L)  ?Date Value  ?10/12/2021 144  ?10/15/2019 140  ?02/19/2014 140  ? ?Assessment: ?Pharmacy has been consulted to monitor and replace electrolytes in 45yo patient admitted with sepsis secondary to UTI. She is currently in a catatonic state and per psychology, will start patient on ECT. Patient with significant weight loss of more than 30 pounds. Dietician also following. ?-failure to thrive ? ?Patient pulled out NG tube x 2 on 2/23- not planning on replacing ? ?Diet: NPO ?MIVF: D5-1/2NS + 20 mEq K/L at 75 cc/hr ? ?Goal of Therapy:  ?Electrolytes within normal limits ? ?Plan:  ?Lytes remain WNL, diet resumed. Will CTM d/t concern for refeed. ?--Once patient tolerating diet; closer monitoring may be indicated to watch for re-feeding ?--Will continue to follow along ? ?Shanon Brow Edwina Grossberg ?10/14/2021 7:43 AM ? ?

## 2021-10-14 NOTE — Consult Note (Signed)
[facility] Consult   Reason for Consult: Follow-up patient was catatonic receiving ECT.  2 treatments now so far. Referring Physician: Ouida Sills Patient Identification: Karen [name] MRN:  [id number] Principal Diagnosis: Catatonia Diagnosis:  Principal Problem:   Catatonia Active Problems:   Depression with anxiety   Seizure disorder ([place])   Sepsis secondary to UTI ([place])   AMS (altered mental status)   Thyroid nodule   Hypokalemia   Elevated troponin   Depression, major, recurrent, severe with psychosis ([place])   Malnutrition of moderate degree   Total Time spent with patient: 20 minutes  Subjective:   Karen [name] is a 46 y.o. female patient admitted with patient currently nonverbal.  HPI: After her first treatment patient had documented improvement which was transient as would be expected.  Not clear whether she had any improvement yesterday but today it appears she has been back in a catatonic state pretty much all day.  Came by to see her this evening she was not arousable.  Past Psychiatric History: What sounds like a psychotic depression this been brewing up for months  Risk to Self:   Risk to Others:   Prior Inpatient Therapy:   Prior Outpatient Therapy:    Past Medical History:  Past Medical History:  Diagnosis Date   Ehlers-Danlos syndrome    Fibromyalgia    Headache    Sleep apnea    Tics of organic origin    Transient alteration of awareness     Past Surgical History:  Procedure Laterality Date   COLONOSCOPY WITH PROPOFOL N/A 02/13/2018   Procedure: COLONOSCOPY WITH PROPOFOL;  Surgeon: [name], MD;  Location: [facility] ENDOSCOPY;  Service: Endoscopy;  Laterality: N/A;   DILATION AND CURETTAGE OF UTERUS  2009   ESOPHAGOGASTRODUODENOSCOPY (EGD) WITH PROPOFOL  02/13/2018   Procedure: ESOPHAGOGASTRODUODENOSCOPY (EGD) WITH PROPOFOL;  Surgeon: [name], MD;  Location: [facility]COPY;  Service: Endoscopy;;   EYE SURGERY Left 1990   3  Surgeries on left eye to correct crossed eye   LAPAROSCOPY Left 2003   Fallopian Tube   [place]   3 Surgeries to repair broken arm   OTHER SURGICAL HISTORY Left    3 surgeries on her left thigh as an infant   OTHER SURGICAL HISTORY  1998   Jaw surgery   Right arm surgery  1987   x 3 due to fracture   TOENAIL EXCISION Bilateral 06/13/2019   Procedure: BILATERAL SECOND TOE PARTIAL NAIL ABLATION;  Surgeon: Erle Crocker, MD;  Location: [facility];  Service: Orthopedics;  Laterality: Bilateral;  SURGERY REQUEST TIME 1 HOUR   TONSILLECTOMY  12/13/2002   Family History:  Family History  Problem Relation Age of Onset   Depression Mother    Stroke Mother    Fibromyalgia Mother    Osteoarthritis Mother    Asthma Brother    Depression Brother    Migraines Brother    Asperger's syndrome Brother    Other Brother        Ehlers-Danlos Syndrome   Cancer Maternal Aunt    Asperger's syndrome Maternal Aunt    Breast cancer Maternal Aunt    Heart disease Paternal Aunt    Heart disease Paternal Uncle    Cervical cancer Maternal Grandmother    Osteoporosis Maternal Grandmother        Died at 59   Osteoarthritis Maternal Grandmother    Colon cancer Maternal Grandfather  Asthma Maternal Grandfather    Asperger's syndrome Maternal Grandfather    Emphysema Maternal Grandfather        Died at 25   Prostate cancer Maternal Grandfather    Heart attack Paternal Grandfather        Died at 4   Asthma Paternal Grandfather    Tuberculosis Paternal Grandfather    Cancer Cousin    Asperger's syndrome Cousin    Asperger's syndrome Other    Heart Problems Paternal Grandmother        Died at 83   Ehlers-Danlos syndrome Father    Ehlers-Danlos syndrome Brother    Family Psychiatric  History: See previous Social History:  Social History   Substance and Sexual Activity  Alcohol Use No   Alcohol/week: 0.0 standard drinks      Social History   Substance and Sexual Activity  Drug Use No    Social History   Socioeconomic History   Marital status: Single    Spouse name: Not on file   Number of children: Not on file   Years of education: Not on file   Highest education level: Not on file  Occupational History   Not on file  Tobacco Use   Smoking status: Never   Smokeless tobacco: Never  Vaping Use   Vaping Use: Never used  Substance and Sexual Activity   Alcohol use: No    Alcohol/week: 0.0 standard drinks   Drug use: No   Sexual activity: Never  Other Topics Concern   Not on file  Social History Narrative   Vestal is 86.   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    She lives with her mother.    She enjoys reading, watching TV, writing, and painting.    Social Determinants of Health   Financial Resource Strain: Not on file  Food Insecurity: Not on file  Transportation Needs: Not on file  Physical Activity: Not on file  Stress: Not on file  Social Connections: Not on file   Additional Social History:    Allergies:   Allergies  Allergen Reactions   Augmentin  [Amoxicillin-Pot Clavulanate] Diarrhea   Chocolate Flavor     Other reaction(s): Other (See Comments) Wheezing, acne   Demerol [Meperidine] Other (See Comments)    Slow to wake up when this drug is given.    Keflex [Cephalexin] Nausea And Vomiting   Morphine And Related Nausea And Vomiting   Tape Dermatitis    Must use paper tape only   Toradol [Ketorolac Tromethamine] Nausea And Vomiting   Amoxicillin     Other reaction(s): Unknown   Chocolate     GI distress   Nsaids     patient develops ulcers Other reaction(s): Other (See Comments) patient develops ulcers   Strawberry Extract     GI distress    Labs:  Results for orders placed or performed during the hospital encounter of 10/04/21 (from the past 48 hour(s))  Glucose, capillary     Status: Abnormal   Collection Time: 10/14/21  7:56 AM  Result Value Ref Range    Glucose-Capillary 124 (H) 70 - 99 mg/dL    Comment: Glucose reference range applies only to samples taken after fasting for at least 8 hours.    Current Facility-Administered Medications  Medication Dose Route Frequency Provider Last Rate Last Admin   0.9 %  sodium chloride infusion   Intravenous PRN Fritzi Mandes, MD 0 mL/hr at 10/10/21 2139 New Bag at 10/13/21 1353   acetaminophen (  TYLENOL) tablet 650 mg  650 mg Oral Q6H PRN Para Skeans, MD       Or   acetaminophen (TYLENOL) suppository 650 mg  650 mg Rectal Q6H PRN Para Skeans, MD       albuterol (PROVENTIL) (2.5 MG/3ML) 0.083% nebulizer solution 2.5 mg  2.5 mg Nebulization Q6H PRN Belue, Alver Sorrow, RPH       dextrose 5 % and 0.45 % NaCl with KCl 20 mEq/L infusion   Intravenous Continuous Benita Gutter, RPH 75 mL/hr at 10/14/21 1640 New Bag at 10/14/21 1640   enoxaparin (LOVENOX) injection 40 mg  40 mg Subcutaneous Q24H Fritzi Mandes, MD   40 mg at 10/12/21 2145   haloperidol lactate (HALDOL) injection 2 mg  2 mg Intravenous Q6H PRN Anyely Cunning, Madie Reno, MD       OLANZapine zydis (ZYPREXA) disintegrating tablet 10 mg  10 mg Oral QHS Kartier Bennison T, MD   10 mg at 10/12/21 2146   sodium chloride flush (NS) 0.9 % injection 3 mL  3 mL Intravenous Q12H Para Skeans, MD   3 mL at 10/13/21 2049    Musculoskeletal: Strength & Muscle Tone: decreased Gait & Station: unable to stand Patient leans: N/A            Psychiatric Specialty Exam:  Presentation  General Appearance: Disheveled  Eye Contact:Fair  Speech:Normal Rate; Clear and Coherent  Speech Volume:Normal  Handedness:Right   Mood and Affect  Mood:Anxious  Affect:Congruent   Thought Process  Thought Processes:Disorganized  Descriptions of Associations:Tangential  Orientation:Full (Time, Place and Person)  Thought Content:Tangential  History of Schizophrenia/Schizoaffective disorder:No  Duration of Psychotic Symptoms:Greater than six months (Since August  2022)  Hallucinations:No data recorded Ideas of Reference:Paranoia  Suicidal Thoughts:No data recorded Homicidal Thoughts:No data recorded  Sensorium  Memory:Immediate [place]   Executive Functions  Concentration:Fair  Attention Span:Fair  [place] of Knowledge:Good  Language:Good   Psychomotor Activity  Psychomotor Activity:No data recorded  Assets  Assets:Communication Skills; Leisure Time; Social Support; Resilience   Sleep  Sleep:No data recorded  Physical Exam: Physical Exam Vitals and nursing note reviewed.  Constitutional:      Appearance: Normal appearance. She is ill-appearing.  HENT:     Head: Normocephalic and atraumatic.     Mouth/Throat:     Pharynx: Oropharynx is clear.  Eyes:     Pupils: Pupils are equal, round, and reactive to light.  Cardiovascular:     Rate and Rhythm: Normal rate and regular rhythm.  Pulmonary:     Effort: Pulmonary effort is normal.     Breath sounds: Normal breath sounds.  Abdominal:     General: Abdomen is flat.     Palpations: Abdomen is soft.  Musculoskeletal:        General: Normal range of motion.  Skin:    General: Skin is warm and dry.  Neurological:     General: No focal deficit present.     Mental Status: Mental status is at baseline.  Psychiatric:        Attention and Perception: She is inattentive.        Speech: She is noncommunicative.   Review of Systems  Unable to perform ROS: Psychiatric disorder  Blood pressure 108/76, pulse 71, temperature 99.8 F (37.7 C), temperature source Oral, resp. rate 20, height 5\' 3"  (1.6 m), weight 60.8 kg, SpO2 97 %. Body mass index is 23.74 kg/m.  Treatment Plan Summary: Plan patient will  be on the schedule for ECT again tomorrow.  Anticipate 6-10 treatments probably for full response.  Continue order of olanzapine at night.  Patient currently probably not able to swallow much else so I am not going to add any other  medicines at the moment.  Disposition: Supportive therapy provided about ongoing stressors.  Alethia Berthold, MD 10/14/2021 5:53 PM

## 2021-10-15 ENCOUNTER — Inpatient Hospital Stay: Payer: Medicare Other | Admitting: Anesthesiology

## 2021-10-15 ENCOUNTER — Inpatient Hospital Stay (HOSPITAL_COMMUNITY)
Admit: 2021-10-15 | Discharge: 2021-10-15 | Disposition: A | Discharge status: Home / Self Care | Payer: Medicare Other | Attending: Internal Medicine | Admitting: Internal Medicine

## 2021-10-15 DIAGNOSIS — Z4659 Encounter for fitting and adjustment of other gastrointestinal appliance and device: Secondary | ICD-10-CM

## 2021-10-15 DIAGNOSIS — F333 Major depressive disorder, recurrent, severe with psychotic symptoms: Secondary | ICD-10-CM | POA: Diagnosis not present

## 2021-10-15 DIAGNOSIS — R4182 Altered mental status, unspecified: Secondary | ICD-10-CM | POA: Diagnosis not present

## 2021-10-15 DIAGNOSIS — N390 Urinary tract infection, site not specified: Secondary | ICD-10-CM

## 2021-10-15 DIAGNOSIS — F061 Catatonic disorder due to known physiological condition: Secondary | ICD-10-CM | POA: Diagnosis not present

## 2021-10-15 LAB — CBC
HCT: 42.2 % (ref 36.0–46.0)
Hemoglobin: 13.8 g/dL (ref 12.0–15.0)
MCH: 27.6 pg (ref 26.0–34.0)
MCHC: 32.7 g/dL (ref 30.0–36.0)
MCV: 84.4 fL (ref 80.0–100.0)
Platelets: 360 10*3/uL (ref 150–400)
RBC: 5 MIL/uL (ref 3.87–5.11)
RDW: 17.2 % — ABNORMAL HIGH (ref 11.5–15.5)
WBC: 9.4 10*3/uL (ref 4.0–10.5)
nRBC: 0 % (ref 0.0–0.2)

## 2021-10-15 LAB — BASIC METABOLIC PANEL
Anion gap: 10 (ref 5–15)
BUN: 7 mg/dL (ref 6–20)
CO2: 25 mmol/L (ref 22–32)
Calcium: 9.2 mg/dL (ref 8.9–10.3)
Chloride: 106 mmol/L (ref 98–111)
Creatinine, Ser: 0.68 mg/dL (ref 0.44–1.00)
GFR, Estimated: >60 mL/min (ref >60)
Glucose, Bld: 112 mg/dL — ABNORMAL HIGH (ref 70–99)
Potassium: 3.7 mmol/L (ref 3.5–5.1)
Sodium: 141 mmol/L (ref 135–145)

## 2021-10-15 LAB — PHOSPHORUS: Phosphorus: 4.5 mg/dL (ref 2.5–4.6)

## 2021-10-15 LAB — MAGNESIUM: Magnesium: 1.9 mg/dL (ref 1.7–2.4)

## 2021-10-15 MED ORDER — MIDAZOLAM HCL 2 MG/2ML IJ SOLN: 2.0000 mg | Freq: Once | INTRAMUSCULAR | Status: DC

## 2021-10-15 MED ORDER — BACLOFEN 10 MG PO TABS
5.0000 mg | ORAL_TABLET | Freq: Two times a day (BID) | ORAL | Status: DC | PRN
  Administered 2021-10-15 – 2021-10-23 (×3): 5 mg via ORAL
  Filled 2021-10-15 (×3): qty 1

## 2021-10-15 MED ORDER — METHOHEXITAL SODIUM 0.5 G IJ SOLR
INTRAMUSCULAR | Status: AC
  Filled 2021-10-15: qty 500

## 2021-10-15 MED ORDER — GLYCOPYRROLATE 0.2 MG/ML IJ SOLN: 0.1000 mg | Freq: Once | INTRAMUSCULAR | Status: DC

## 2021-10-15 MED ORDER — MAGNESIUM SULFATE IN D5W 1-5 GM/100ML-% IV SOLN
1.0000 g | Freq: Once | INTRAVENOUS | Status: AC
  Administered 2021-10-15: 1 g via INTRAVENOUS
  Filled 2021-10-15: qty 100

## 2021-10-15 MED ORDER — SUCCINYLCHOLINE CHLORIDE 200 MG/10ML IV SOSY
PREFILLED_SYRINGE | INTRAVENOUS | Status: AC
  Filled 2021-10-15: qty 10

## 2021-10-15 MED ORDER — SUCCINYLCHOLINE CHLORIDE 200 MG/10ML IV SOSY
PREFILLED_SYRINGE | INTRAVENOUS | Status: DC | PRN
  Administered 2021-10-15: 60 mg via INTRAVENOUS

## 2021-10-15 MED ORDER — SODIUM CHLORIDE 0.9 % IV SOLN: 500.0000 mL | Freq: Once | INTRAVENOUS | Status: DC

## 2021-10-15 MED ORDER — MIDAZOLAM HCL 2 MG/2ML IJ SOLN
INTRAMUSCULAR | Status: AC
Start: 2021-10-15 — End: ?
  Filled 2021-10-15: qty 2

## 2021-10-15 MED ORDER — METHOHEXITAL SODIUM 100 MG/10ML IV SOSY
PREFILLED_SYRINGE | INTRAVENOUS | Status: DC | PRN
Start: 2021-10-15 — End: 2021-10-15
  Administered 2021-10-15: 60 mg via INTRAVENOUS

## 2021-10-15 MED ORDER — IBUPROFEN 400 MG PO TABS
200.0000 mg | ORAL_TABLET | Freq: Four times a day (QID) | ORAL | Status: DC | PRN
  Administered 2021-10-15: 200 mg via ORAL
  Filled 2021-10-15: qty 1

## 2021-10-15 NOTE — Consult Note (Signed)
PHARMACY CONSULT NOTE ? ?Pharmacy Consult for Electrolyte Monitoring and Replacement  ? ?Recent Labs: ?Potassium (mmol/L)  ?Date Value  ?10/15/2021 3.7  ?02/19/2014 3.9  ? ?Magnesium (mg/dL)  ?Date Value  ?10/15/2021 1.9  ? ?Calcium (mg/dL)  ?Date Value  ?10/15/2021 9.2  ? ?Calcium, Total (mg/dL)  ?Date Value  ?02/19/2014 9.0  ? ?Albumin (g/dL)  ?Date Value  ?10/04/2021 3.6  ?10/15/2019 4.5  ?02/19/2014 3.6  ? ?Phosphorus (mg/dL)  ?Date Value  ?10/15/2021 4.5  ? ?Sodium (mmol/L)  ?Date Value  ?10/15/2021 141  ?10/15/2019 140  ?02/19/2014 140  ? ?Assessment: ?Pharmacy has been consulted to monitor and replace electrolytes in 46yo patient admitted with sepsis secondary to UTI. She is currently in a catatonic state and per psychology, will start patient on ECT. Patient with significant weight loss of more than 30 pounds. Dietician also following. ?-failure to thrive ? ?Patient pulled out NG tube x 2 on 2/23- not planning on replacing ? ?Diet: NPO 3/3 for ECT ?MIVF: D5-1/2NS + 20 mEq K/L at 75 cc/hr ? ?Goal of Therapy:  ?Electrolytes within normal limits ? ?Plan:  ?Mag 1.9-  will order Magsesium sulfate 1 gram IV x 1 ?Will CTM d/t concern for refeed. ?--Once patient tolerating diet; closer Phos monitoring may be indicated to watch for re-feeding ?--Will continue to follow along ? ?Zan Triska A ?10/15/2021 7:53 AM ? ?

## 2021-10-15 NOTE — Anesthesia Postprocedure Evaluation (Signed)
Anesthesia Post Note ? ?Patient: Karen [name] ? ?Procedure(s) Performed: ECT TX ? ?Patient location during evaluation: PACU ?Anesthesia Type: General ?Level of consciousness: awake ?Pain management: pain level controlled ?Vital Signs Assessment: post-procedure vital signs reviewed and stable ?Respiratory status: spontaneous breathing, nonlabored ventilation and respiratory function stable ?Cardiovascular status: blood pressure returned to baseline and stable ?Postop Assessment: no apparent nausea or vomiting ?Anesthetic complications: no ? ? ?No notable events documented. ? ? ?Last Vitals:  ?Vitals:  ? 10/15/21 1351 10/15/21 1405  ?BP: 110/83 108/87  ?Pulse: 89 85  ?Resp: (!) 25 17  ?Temp:  36.6 ?C  ?SpO2: 96%   ?  ?Last Pain:  ?Vitals:  ? 10/15/21 1405  ?TempSrc: Temporal  ?PainSc:   ? ? ?  ?  ?  ?  ?  ?  ? ?[name] ? ? ? ? ?

## 2021-10-15 NOTE — Progress Notes (Addendum)
?PROGRESS NOTE ? ?Karen [name]    DOB: 1975-10-24, 46 y.o.  ?XLK:[id number]  ?  Code Status: Full Code   ?DOA: 10/04/2021   LOS: 10  ? ?Brief hospital course  ?Karen [name] is a 46 y.o. female with a PMH significant for Erler's Danlos syndrome, fibromyalgia, sleep apnea, transient alteration of awareness, tics of organic origin, headaches. ?They presented from home to the ED on 10/04/2021 with found down x several days days.  She was found lying on the floor covered with urine and feces ?In the ED, it was found that they had UTI and met sepsis criteria with, lactic acidosis.  ?They were treated with ceftriaxone and IV fluids.  ?Patient was admitted to medicine service for further workup and management of AMS as outlined in detail below. ? ?10/15/21 -stable, improved ? ?Assessment & Plan  ?Principal Problem: ?  Catatonia ?Active Problems: ?  Depression with anxiety ?  Seizure disorder ([place]) ?  Sepsis secondary to UTI [facility]) ?  AMS (altered mental status) ?  Thyroid nodule ?  Hypokalemia ?  Elevated troponin ?  Depression, major, recurrent, severe with psychosis ([place]) ?  Malnutrition of moderate degree ? ?History of paranoid psychosis with hallucinations and delusions/catatonia ?Severe major depression and anxiety-patient presented in catatonic state and is showing improvement after ECT. Patient status appears stable on my exam every morning ?-Psychiatry following, appreciate recommendations ? -ECT 2/27- ? -Continue Zyprexa ?- PT/OT ? ?Sepsis-sepsis physiology has resolved.  S/p CTX for UTI ? ?Elevated troponins on admission-likely from demand ischemia.  No cardiac history, unable to assess if she had chest pain, echo unremarkable. Cardiology evaluated ? ?Failure to thrive-poor nutrition status due to poor PO intake in sedated state and did not tolerate NG tube. Had intermittent success with a whole meal eaten two days ago but is inconsistent. Blood sugars have remained stable. Patient is NPO prior to ECT  treatments. At this time, if not getting consistent intake and without significant improvement in mentation, will need to start considering enteral/parental feedings if in line with GOC long term ?- strict I/O ?- calorie count ?- nutrition consult ?-Encourage p.o. intake ?-Continue IV fluids with dextrose until tolerating p.o. ? ?Thyroid nodule- thyroid ultrasound Diffusely heterogeneous and enlarged thyroid gland most consistent with diffuse goitrous change. ? - Recommend follow-up ultrasound in 1 year ? ?Body mass index is 23.74 kg/m?. ? ?VTE ppx: enoxaparin (LOVENOX) injection 40 mg Start: 10/11/21 2200 ?SCDs Start: 10/04/21 2355 ? ?Diet:  ?   ?Diet  ? Diet NPO time specified  ? ?Subjective 10/15/21   ? ?Pt reports no complaints today as she would not wake up for conversation. She did briefly open her eyes when tried to wake her. ?  ?Objective  ? ?Vitals:  ? 10/14/21 1954 10/15/21 0038 10/15/21 0038 10/15/21 0456  ?BP: 1'00/71 97/72 97/72 ' 99/77  ?Pulse: 72 74 78 75  ?Resp: '16 16 16 16  ' ?Temp: 98 ?F (36.7 ?C) 98 ?F (36.7 ?C) 98 ?F (36.7 ?C) 98 ?F (36.7 ?C)  ?TempSrc:      ?SpO2: 99% 98% 98% 98%  ?Weight:      ?Height:      ? ? ?Intake/Output Summary (Last 24 hours) at 10/15/2021 0724 ?Last data filed at 10/15/2021 0458 ?Gross per 24 hour  ?Intake 1375.05 ml  ?Output 150 ml  ?Net 1225.05 ml  ? ? ?Filed Weights  ? 10/04/21 1900  ?Weight: 60.8 kg  ?  ? ?Physical Exam:  ?General: asleep, NAD ?  HEENT: atraumatic, clear conjunctiva, anicteric sclera, MMM ?Respiratory: normal respiratory effort. ?Cardiovascular: normal S1/S2, RRR, no JVD, murmurs, quick capillary refill  ?Nervous: sleeping and only briefly opened eyes when tried to wake her ?Skin: dry, intact, normal temperature, normal color. No rashes, lesions or ulcers on exposed skin ? ?Labs   ?I have personally reviewed the following labs and imaging studies ?CBC ?   ?Component Value Date/Time  ? WBC 9.4 10/15/2021 0443  ? RBC 5.00 10/15/2021 0443  ? HGB 13.8 10/15/2021  0443  ? HGB 12.8 10/15/2019 1023  ? HCT 42.2 10/15/2021 0443  ? HCT 37.9 10/15/2019 1023  ? PLT 360 10/15/2021 0443  ? PLT 256 10/15/2019 1023  ? MCV 84.4 10/15/2021 0443  ? MCV 85 10/15/2019 1023  ? MCV 92 02/19/2014 0035  ? MCH 27.6 10/15/2021 0443  ? MCHC 32.7 10/15/2021 0443  ? RDW 17.2 (H) 10/15/2021 0443  ? RDW 13.3 10/15/2019 1023  ? RDW 13.8 02/19/2014 0035  ? LYMPHSABS 1.8 10/04/2021 1901  ? LYMPHSABS 2.7 10/15/2019 1023  ? LYMPHSABS 3.5 02/19/2014 0035  ? MONOABS 0.6 10/04/2021 1901  ? MONOABS 0.5 02/19/2014 0035  ? EOSABS 0.1 10/04/2021 1901  ? EOSABS 0.3 10/15/2019 1023  ? EOSABS 0.9 (H) 02/19/2014 0035  ? BASOSABS 0.0 10/04/2021 1901  ? BASOSABS 0.0 10/15/2019 1023  ? BASOSABS 0.1 02/19/2014 0035  ? ?BMP Latest Ref Rng & Units 10/15/2021 10/12/2021 10/11/2021  ?Glucose 70 - 99 mg/dL 112(H) 124(H) 96  ?BUN 6 - 20 mg/dL '7 6 6  ' ?Creatinine 0.44 - 1.00 mg/dL 0.68 0.85 0.75  ?BUN/Creat Ratio 9 - 23 - - -  ?Sodium 135 - 145 mmol/L 141 144 141  ?Potassium 3.5 - 5.1 mmol/L 3.7 3.5 3.5  ?Chloride 98 - 111 mmol/L 106 109 105  ?CO2 22 - 32 mmol/L '25 26 22  ' ?Calcium 8.9 - 10.3 mg/dL 9.2 9.3 9.0  ? ? ?No results found. ? ?Disposition Plan & Communication  ?Patient status: Inpatient  ?Admitted From: Home ?Planned disposition location: TBD ?Anticipated discharge date: TBD pending continuing ECT therapy, rough estimate would be 3/9 and might need inpatient psychiatry admission ? ?Family Communication: none  ?  ?Author: ?Karen Osmond, DO ?[facility] ?10/15/2021, 7:24 AM  ? ?Available by Epic secure chat 7AM-7PM. ?If 7PM-7AM, please contact night-coverage.  ?[facility] contact information found on [email or web address] ? ?

## 2021-10-15 NOTE — Transfer of Care (Signed)
Immediate Anesthesia Transfer of Care Note ? ?Patient: Karen [name] ? ?Procedure(s) Performed: ECT TX ? ?Patient Location: PACU ? ?Anesthesia Type:General ? ?Level of Consciousness: awake ? ?Airway & Oxygen Therapy: Patient Spontanous Breathing ? ?Post-op Assessment: Report given to RN and Post -op Vital signs reviewed and stable ? ?Post vital signs: Reviewed and stable ? ?Last Vitals:  ?Vitals Value Taken Time  ?BP 110/83 10/15/21 1351  ?Temp 36.8 ?C 10/15/21 1345  ?Pulse 89 10/15/21 1351  ?Resp 25 10/15/21 1351  ?SpO2 96 % 10/15/21 1351  ? ? ?Last Pain:  ?Vitals:  ? 10/15/21 1143  ?TempSrc: Tympanic  ?PainSc: 0-No pain  ?   ? ?  ? ?Complications: No notable events documented. ?

## 2021-10-15 NOTE — Progress Notes (Signed)
OT Cancellation Note ? ?Patient Details ?Name: Karen [name] ?MRN: [id number] ?DOB: [birth year] ? ? ?Cancelled Treatment:    Reason Eval/Treat Not Completed: Patient at procedure or test/ unavailable ?Pt OTF to ECT at this time. Will f/u at later date/time as able for OT evaluation. Thank you. ? ?Gerrianne Scale, [place], OTR/L ?ascom [phone] ?10/15/21, 1:31 PM  ?

## 2021-10-15 NOTE — Procedures (Signed)
ECT SERVICES ?Physician?s Interval Evaluation & Treatment Note ? ?Patient Identification: Karen [name] ?MRN:  [id number] ?Date of Evaluation:  10/15/2021 ?[place] #: 3 ? ?MADRS:  ? ?MMSE:  ? ?P.E. Findings: ? ?Patient remains uncommunicative most of the time ? ?Psychiatric Interval Note: ? ?Still mostly unresponsive and catatonic ? ?Subjective: ? ?Patient is a 46 y.o. female seen for evaluation for Electroconvulsive Therapy. ?No specific complaint ? ?Treatment Summary: ? ? []   Right Unilateral             [x]  Bilateral ?  ?% Energy : 1.0 ms 40% ? ? Impedance: 1880 ohms ? ?Seizure Energy Index: 4320 ?V squared ? ?Postictal Suppression Index: 74% ? ?Seizure Concordance Index: 88% ? ?Medications ? ?Pre Shock: Brevital 60 mg succinylcholine 60 mg ? ?Post Shock: Versed 2 mg ? ?Seizure Duration: 44 seconds EMG 84 seconds EEG ? ? ?Comments: ?Continue treatments into next week ? ?Lungs: ? ?[x]   Clear to auscultation              ? ?[]  Other:  ? ?Heart: ?  ? [x]   Regular rhythm             []  irregular rhythm ? ? ? [x]   Previous H&P reviewed, patient examined and there are NO CHANGES              ?  ? []   Previous H&P reviewed, patient examined and there are changes noted. ? ? ?Alethia Berthold, MD ?3/3/20232:33 PM ? ?  ?

## 2021-10-15 NOTE — Anesthesia Procedure Notes (Signed)
Procedure Name: General with mask airway ?Date/Time: 10/15/2021 1:30 PM ?Performed by: Debe Coder, CRNA ?Pre-anesthesia Checklist: Patient identified, Emergency Drugs available, Suction available, Patient being monitored and Timeout performed ?Patient Re-evaluated:Patient Re-evaluated prior to induction ?Oxygen Delivery Method: Circle system utilized ?Preoxygenation: Pre-oxygenation with 100% oxygen ?Induction Type: IV induction ?Ventilation: Mask ventilation throughout procedure ? ? ? ? ?

## 2021-10-15 NOTE — TOC Progression Note (Signed)
Transition of Care (TOC) - Progression Note  ? ? ?Patient Details  ?Name: Karen [name] ?MRN: [id number] ?Date of Birth: 07/19/1976 ? ?Transition of Care (TOC) CM/SW Contact  ?Pete Pelt, RN ?Phone Number: ?10/15/2021, 10:01 AM ? ?Clinical Narrative:    Patient continues with ECT treatments.  Hospitalist and Psychiatry are continuing to follow patient's progress.  Home Health set up, TOC to follow if further needs. ? ? ? ?Expected Discharge Plan:  (Home with parents as caregivers) ?Barriers to Discharge: Continued Medical Work up ? ?Expected Discharge Plan and Services ?Expected Discharge Plan:  (Home with parents as caregivers) ?  ?Discharge Planning Services: CM Consult ?  ?Living arrangements for the past 2 months: [place] ?                ?  ?  ?  ?  ?  ?  ?  ?  ?  ?  ? ? ?Social Determinants of Health (SDOH) Interventions ?  ? ?Readmission Risk Interventions ?No flowsheet data found. ? ?

## 2021-10-15 NOTE — Progress Notes (Signed)
PT Cancellation Note ? ?Patient Details ?Name: Karen [name] ?MRN: [id number] ?DOB: [birth year] ? ? ?Cancelled Treatment:    Reason Eval/Treat Not Completed: Pain limiting ability to participate.  Chart reviewed.  Pt s/p ECT treatment today.  Pt resting in bed upon PT arrival and was awake and alert (although confusion was noted); pt's mother present.  Pt declining PT evaluation d/t 10/10 body pain (nurse notified of pt's pain, pt's request for pain meds, and also pt's request for a boost drink).  PLOF and home set-up information obtained (see chart for details).  Will re-attempt PT evaluation at a later date/time. ? ?Leitha Bleak, PT ?10/15/21, 5:31 PM ? ?

## 2021-10-15 NOTE — H&P (Signed)
Karen [name] is an 46 y.o. female.   ?Chief Complaint: Patient was uncommunicative prior to treatment and had no specific complaint ?HPI: Patient has a history of this single episode evidently of severe depression resulting in catatonia ? ?Past Medical History:  ?Diagnosis Date  ? Ehlers-Danlos syndrome   ? Fibromyalgia   ? Headache   ? Sleep apnea   ? Tics of organic origin   ? Transient alteration of awareness   ? ? ?Past Surgical History:  ?Procedure Laterality Date  ? COLONOSCOPY WITH PROPOFOL N/A 02/13/2018  ? Procedure: COLONOSCOPY WITH PROPOFOL;  Surgeon: [name], MD;  Location: [facility] ENDOSCOPY;  Service: Endoscopy;  Laterality: N/A;  ? [place] OF UTERUS  2009  ? ESOPHAGOGASTRODUODENOSCOPY (EGD) WITH PROPOFOL  02/13/2018  ? Procedure: ESOPHAGOGASTRODUODENOSCOPY (EGD) WITH PROPOFOL;  Surgeon: [name], MD;  Location: [facility] ENDOSCOPY;  Service: Endoscopy;;  ? EYE SURGERY Left 1990  ? 3 Surgeries on left eye to correct crossed eye  ? LAPAROSCOPY Left 2003  ? Fallopian Tube  ? [place]  ? OTHER SURGICAL HISTORY Right 1987  ? 3 Surgeries to repair broken arm  ? OTHER SURGICAL HISTORY Left   ? 3 surgeries on her left thigh as an infant  ? OTHER SURGICAL HISTORY  1998  ? Jaw surgery  ? Right arm surgery  1987  ? x 3 due to fracture  ? TOENAIL EXCISION Bilateral 06/13/2019  ? Procedure: BILATERAL SECOND TOE PARTIAL NAIL ABLATION;  Surgeon: Erle Crocker, MD;  Location: [place];  Service: Orthopedics;  Laterality: Bilateral;  SURGERY REQUEST TIME 1 HOUR  ? TONSILLECTOMY  12/13/2002  ? ? ?Family History  ?Problem Relation Age of Onset  ? Depression Mother   ? Stroke Mother   ? Fibromyalgia Mother   ? Osteoarthritis Mother   ? Asthma Brother   ? Depression Brother   ? Migraines Brother   ? Asperger's syndrome Brother   ? Other Brother   ?     Ehlers-Danlos Syndrome  ? Cancer Maternal Aunt   ? Asperger's syndrome Maternal Aunt   ? Breast cancer Maternal Aunt    ? Heart disease Paternal Aunt   ? Heart disease Paternal Uncle   ? Cervical cancer Maternal Grandmother   ? Osteoporosis Maternal Grandmother   ?     Died at 62  ? Osteoarthritis Maternal Grandmother   ? Colon cancer Maternal Grandfather   ? Asthma Maternal Grandfather   ? Asperger's syndrome Maternal Grandfather   ? Emphysema Maternal Grandfather   ?     Died at 22  ? Prostate cancer Maternal Grandfather   ? Heart attack Paternal Grandfather   ?     Died at 30  ? Asthma Paternal Grandfather   ? Tuberculosis Paternal Grandfather   ? Cancer Cousin   ? Asperger's syndrome Cousin   ? Asperger's syndrome Other   ? Heart Problems Paternal Grandmother   ?     Died at 37  ? Ehlers-Danlos syndrome Father   ? Ehlers-Danlos syndrome Brother   ? ?Social History:  reports that she has never smoked. She has never used smokeless tobacco. She reports that she does not drink alcohol and does not use drugs. ? ?Allergies:  ?Allergies  ?Allergen Reactions  ? Augmentin  [Amoxicillin-Pot Clavulanate] Diarrhea  ? Chocolate Flavor   ?  Other reaction(s): Other (See Comments) ?Wheezing, acne  ? Demerol [Meperidine] Other (See Comments)  ?  Slow to  wake up when this drug is given.   ? Keflex [Cephalexin] Nausea And Vomiting  ? Morphine And Related Nausea And Vomiting  ? Tape Dermatitis  ?  Must use paper tape only  ? Toradol [Ketorolac Tromethamine] Nausea And Vomiting  ? Amoxicillin   ?  Other reaction(s): Unknown  ? Chocolate   ?  GI distress  ? Nsaids   ?  patient develops ulcers ?Other reaction(s): Other (See Comments) ?patient develops ulcers  ? Strawberry Extract   ?  GI distress  ? ? ?(Not in a hospital admission) ? ? ?Results for orders placed or performed during the hospital encounter of 10/04/21 (from the past 48 hour(s))  ?Glucose, capillary     Status: Abnormal  ? Collection Time: 10/14/21  7:56 AM  ?Result Value Ref Range  ? Glucose-Capillary 124 (H) 70 - 99 mg/dL  ?  Comment: Glucose reference range applies only to samples  taken after fasting for at least 8 hours.  ?CBC     Status: Abnormal  ? Collection Time: 10/15/21  4:43 AM  ?Result Value Ref Range  ? WBC 9.4 4.0 - 10.5 K/uL  ? RBC 5.00 3.87 - 5.11 MIL/uL  ? Hemoglobin 13.8 12.0 - 15.0 g/dL  ? HCT 42.2 36.0 - 46.0 %  ? MCV 84.4 80.0 - 100.0 fL  ? MCH 27.6 26.0 - 34.0 pg  ? MCHC 32.7 30.0 - 36.0 g/dL  ? RDW 17.2 (H) 11.5 - 15.5 %  ? Platelets 360 150 - 400 K/uL  ? nRBC 0.0 0.0 - 0.2 %  ?  Comment: Performed at [facility], [address], [place], [place] [address]  ?Basic metabolic panel     Status: Abnormal  ? Collection Time: 10/15/21  4:43 AM  ?Result Value Ref Range  ? Sodium 141 135 - 145 mmol/L  ? Potassium 3.7 3.5 - 5.1 mmol/L  ? Chloride 106 98 - 111 mmol/L  ? CO2 25 22 - 32 mmol/L  ? Glucose, Bld 112 (H) 70 - 99 mg/dL  ?  Comment: Glucose reference range applies only to samples taken after fasting for at least 8 hours.  ? BUN 7 6 - 20 mg/dL  ? Creatinine, Ser 0.68 0.44 - 1.00 mg/dL  ? Calcium 9.2 8.9 - 10.3 mg/dL  ? GFR, Estimated >60 >60 mL/min  ?  Comment: (NOTE) ?Calculated using the CKD-EPI Creatinine Equation (2021) ?  ? Anion gap 10 5 - 15  ?  Comment: Performed at [facility], [address] [place], [place] [address]  ?Magnesium     Status: None  ? Collection Time: 10/15/21  4:43 AM  ?Result Value Ref Range  ? Magnesium 1.9 1.7 - 2.4 mg/dL  ?  Comment: Performed at [facility], [address], [place], [place] [address]  ?Phosphorus     Status: None  ? Collection Time: 10/15/21  4:43 AM  ?Result Value Ref Range  ? Phosphorus 4.5 2.5 - 4.6 mg/dL  ?  Comment: Performed at [facility], [address], [place], [place] [address]  ? ?No results found. ? ?Review of Systems  ?Unable to perform ROS: Psychiatric disorder  ?Skin: Negative.   ? ?Blood pressure 108/87, pulse 85, temperature 97.8 ?F (36.6 ?C), temperature source Temporal, resp. rate 17, height 5\' 3"  (1.6 m), weight 60.8 kg, SpO2 96 %. ?Physical Exam ?Vitals and  nursing note reviewed.  ?Constitutional:   ?   Appearance: She is well-developed. She is ill-appearing.  ?  HENT:  ?   Head: Normocephalic and atraumatic.  ?Eyes:  ?   Conjunctiva/sclera: Conjunctivae normal.  ?   Pupils: Pupils are equal, round, and reactive to light.  ?Cardiovascular:  ?   Heart sounds: Normal heart sounds.  ?Pulmonary:  ?   Effort: Pulmonary effort is normal.  ?Abdominal:  ?   Palpations: Abdomen is soft.  ?Musculoskeletal:     ?   General: Normal range of motion.  ?   Cervical back: Normal range of motion.  ?Skin: ?   General: Skin is warm and dry.  ?Psychiatric:     ?   Attention and Perception: She is inattentive.     ?   Mood and Affect: Affect is blunt.     ?   Speech: She is noncommunicative.  ?  ? ?Assessment/Plan ?Showing transient improvement with the treatment.  Slipping back into catatonia.  Plan is to continue treatment into next week ? ?Alethia Berthold, MD ?10/15/2021, 2:32 PM ? ? ? ?

## 2021-10-15 NOTE — Care Management Important Message (Signed)
Important Message ? ?Patient Details  ?Name: ANANI GU ?MRN: [id number] ?Date of Birth: 28-Jul-1976 ? ? ?Medicare Important Message Given:  Yes ? ?I reviewed the Important Message from Medicare with the patient's mother by phone [phone]). Asked if I could leave a copy in her room for her to sign and she said that would be fine.  I thanked her for time. ? ? ?Juliann Pulse A Elian Gloster ?10/15/2021, 10:34 AM ?

## 2021-10-15 NOTE — Consult Note (Signed)
Karen [name] Psychiatry Consult   Reason for Consult: Follow-up patient with catatonia receiving ECT.  Patient had her third ECT treatment today Referring Physician: Ouida [name] Patient Identification: Karen [name] MRN:  [id number] Principal Diagnosis: Catatonia Diagnosis:  Principal Problem:   Catatonia Active Problems:   Depression with anxiety   Seizure disorder ([place])   Sepsis secondary to UTI ([place])   AMS (altered mental status)   Thyroid nodule   Hypokalemia   Elevated troponin   Depression, major, recurrent, severe with psychosis ([place])   Malnutrition of moderate degree   Encounter for nasogastric (NG) tube placement   Urinary tract infection without hematuria   Total Time spent with patient: 30 minutes  Subjective:   Karen [name] is a 46 y.o. female patient admitted with "I am confused".  HPI: Patient seen today for ECT and I also came to see her later in the afternoon.  I attempted to call her mother but just left a voicemail with updated information.  Patient had her ECT treatment #3 today once again well-tolerated.  This afternoon she was awake alert engaged appropriate affect able to ask appropriate questions and process information.  Memory impaired confused about her situation.  Otherwise not agitated.  Cooperative with nursing.  Past Psychiatric History: History of what sounds like a psychotic depression worsening over many months  Risk to Self:   Risk to Others:   Prior Inpatient Therapy:   Prior Outpatient Therapy:    Past Medical History:  Past Medical History:  Diagnosis Date   Ehlers-Danlos syndrome    Fibromyalgia    Headache    Sleep apnea    Tics of organic origin    Transient alteration of awareness     Past Surgical History:  Procedure Laterality Date   COLONOSCOPY WITH PROPOFOL N/A 02/13/2018   Procedure: COLONOSCOPY WITH PROPOFOL;  Surgeon: [name], MD;  Location: [facility] ENDOSCOPY;  Service: Endoscopy;  Laterality: N/A;   DILATION  AND CURETTAGE OF UTERUS  2009   ESOPHAGOGASTRODUODENOSCOPY (EGD) WITH PROPOFOL  02/13/2018   Procedure: ESOPHAGOGASTRODUODENOSCOPY (EGD) WITH PROPOFOL;  Surgeon: [name], MD;  Location: [place] ENDOSCOPY;  Service: Endoscopy;;   EYE SURGERY Left 1990   3 Surgeries on left eye to correct crossed eye   LAPAROSCOPY Left 2003   Fallopian Tube   [place]   3 Surgeries to repair broken arm   OTHER SURGICAL HISTORY Left    3 surgeries on her left thigh as an infant   OTHER SURGICAL HISTORY  1998   Jaw surgery   Right arm surgery  1987   x 3 due to fracture   TOENAIL EXCISION Bilateral 06/13/2019   Procedure: BILATERAL SECOND TOE PARTIAL NAIL ABLATION;  Surgeon: Erle Crocker, MD;  Location: [place];  Service: Orthopedics;  Laterality: Bilateral;  SURGERY REQUEST TIME 1 HOUR   TONSILLECTOMY  12/13/2002   Family History:  Family History  Problem Relation Age of Onset   Depression Mother    Stroke Mother    Fibromyalgia Mother    Osteoarthritis Mother    Asthma Brother    Depression Brother    Migraines Brother    Asperger's syndrome Brother    Other Brother        Ehlers-Danlos Syndrome   Cancer Maternal Aunt    Asperger's syndrome Maternal Aunt    Breast cancer Maternal Aunt    Heart disease Paternal Aunt    Heart  disease Paternal Uncle    Cervical cancer Maternal Grandmother    Osteoporosis Maternal Grandmother        Died at 31   Osteoarthritis Maternal Grandmother    Colon cancer Maternal Grandfather    Asthma Maternal Grandfather    Asperger's syndrome Maternal Grandfather    Emphysema Maternal Grandfather        Died at 62   Prostate cancer Maternal Grandfather    Heart attack Paternal Grandfather        Died at 57   Asthma Paternal Grandfather    Tuberculosis Paternal Grandfather    Cancer Cousin    Asperger's syndrome Cousin    Asperger's syndrome Other    Heart Problems Paternal  Grandmother        Died at 8   Ehlers-Danlos syndrome Father    Ehlers-Danlos syndrome Brother    Family Psychiatric  History: See previous Social History:  Social History   Substance and Sexual Activity  Alcohol Use No   Alcohol/week: 0.0 standard drinks     Social History   Substance and Sexual Activity  Drug Use No    Social History   Socioeconomic History   Marital status: Single    Spouse name: Not on file   Number of children: Not on file   Years of education: Not on file   Highest education level: Not on file  Occupational History   Not on file  Tobacco Use   Smoking status: Never   Smokeless tobacco: Never  Vaping Use   Vaping Use: Never used  Substance and Sexual Activity   Alcohol use: No    Alcohol/week: 0.0 standard drinks   Drug use: No   Sexual activity: Never  Other Topics Concern   Not on file  Social History Narrative   Karen [name] is 63.   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    She lives with her mother.    She enjoys reading, watching TV, writing, and painting.    Social Determinants of Health   Financial Resource Strain: Not on file  Food Insecurity: Not on file  Transportation Needs: Not on file  Physical Activity: Not on file  Stress: Not on file  Social Connections: Not on file   Additional Social History:    Allergies:   Allergies  Allergen Reactions   Augmentin  [Amoxicillin-Pot Clavulanate] Diarrhea   Chocolate Flavor     Other reaction(s): Other (See Comments) Wheezing, acne   Demerol [Meperidine] Other (See Comments)    Slow to wake up when this drug is given.    Keflex [Cephalexin] Nausea And Vomiting   Morphine And Related Nausea And Vomiting   Tape Dermatitis    Must use paper tape only   Toradol [Ketorolac Tromethamine] Nausea And Vomiting   Amoxicillin     Other reaction(s): Unknown   Chocolate     GI distress   Nsaids     patient develops ulcers Other reaction(s): Other (See Comments) patient develops ulcers    Strawberry Extract     GI distress    Labs:  Results for orders placed or performed during the hospital encounter of 10/04/21 (from the past 48 hour(s))  Glucose, capillary     Status: Abnormal   Collection Time: 10/14/21  7:56 AM  Result Value Ref Range   Glucose-Capillary 124 (H) 70 - 99 mg/dL    Comment: Glucose reference range applies only to samples taken after fasting for at least 8 hours.  CBC  Status: Abnormal   Collection Time: 10/15/21  4:43 AM  Result Value Ref Range   WBC 9.4 4.0 - 10.5 K/uL   RBC 5.00 3.87 - 5.11 MIL/uL   Hemoglobin 13.8 12.0 - 15.0 g/dL   HCT 42.2 36.0 - 46.0 %   MCV 84.4 80.0 - 100.0 fL   MCH 27.6 26.0 - 34.0 pg   MCHC 32.7 30.0 - 36.0 g/dL   RDW 17.2 (H) 11.5 - 15.5 %   Platelets 360 150 - 400 K/uL   nRBC 0.0 0.0 - 0.2 %    Comment: Performed at [facility], [address] [place], [place] [address]  Basic metabolic panel     Status: Abnormal   Collection Time: 10/15/21  4:43 AM  Result Value Ref Range   Sodium 141 135 - 145 mmol/L   Potassium 3.7 3.5 - 5.1 mmol/L   Chloride 106 98 - 111 mmol/L   CO2 25 22 - 32 mmol/L   Glucose, Bld 112 (H) 70 - 99 mg/dL    Comment: Glucose reference range applies only to samples taken after fasting for at least 8 hours.   BUN 7 6 - 20 mg/dL   Creatinine, Ser 0.68 0.44 - 1.00 mg/dL   Calcium 9.2 8.9 - 10.3 mg/dL   GFR, Estimated >60 >60 mL/min    Comment: (NOTE) Calculated using the CKD-EPI Creatinine Equation (2021)    Anion gap 10 5 - 15    Comment: Performed at [facility], [address], [place], [place] [address]  Magnesium     Status: None   Collection Time: 10/15/21  4:43 AM  Result Value Ref Range   Magnesium 1.9 1.7 - 2.4 mg/dL    Comment: Performed at [facility], [address], [place], [place] [address]  Phosphorus     Status: None   Collection Time: 10/15/21  4:43 AM  Result Value Ref Range   Phosphorus 4.5 2.5 - 4.6 mg/dL    Comment: Performed  at [facility], [address] [place], [place] [address]    Current Facility-Administered Medications  Medication Dose Route Frequency Provider Last Rate Last Admin   0.9 %  sodium chloride infusion   Intravenous PRN Fritzi Mandes, MD 0 mL/hr at 10/10/21 2139 Continued from Pre-op at 10/15/21 1328   acetaminophen (TYLENOL) tablet 650 mg  650 mg Oral Q6H PRN Para Skeans, MD       Or   acetaminophen (TYLENOL) suppository 650 mg  650 mg Rectal Q6H PRN Para Skeans, MD       albuterol (PROVENTIL) (2.5 MG/3ML) 0.083% nebulizer solution 2.5 mg  2.5 mg Nebulization Q6H PRN Belue, Alver Sorrow, RPH       dextrose 5 % and 0.45 % NaCl with KCl 20 mEq/L infusion   Intravenous Continuous Benita Gutter, RPH 75 mL/hr at 10/15/21 0601 New Bag at 10/15/21 0601   enoxaparin (LOVENOX) injection 40 mg  40 mg Subcutaneous Q24H Fritzi Mandes, MD   40 mg at 10/14/21 2037   haloperidol lactate (HALDOL) injection 2 mg  2 mg Intravenous Q6H PRN Caroljean Monsivais, Madie Reno, MD       OLANZapine zydis (ZYPREXA) disintegrating tablet 10 mg  10 mg Oral QHS Christopher Glasscock T, MD   10 mg at 10/12/21 2146   sodium chloride flush (NS) 0.9 % injection 3 mL  3 mL Intravenous Q12H Para Skeans, MD   3 mL at 10/15/21 0932   Facility-Administered Medications  Ordered in Other Encounters  Medication Dose Route Frequency Provider Last Rate Last Admin   0.9 %  sodium chloride infusion  500 mL Intravenous Once Clotiel Troop T, MD       glycopyrrolate (ROBINUL) injection 0.1 mg  0.1 mg Intravenous Once Adeyemi Hamad, Madie Reno, MD       midazolam (VERSED) injection 2 mg  2 mg Intravenous Once Ashton Sabine, Madie Reno, MD        Musculoskeletal: Strength & Muscle Tone: decreased Gait & Station: unsteady Patient leans: N/A            Psychiatric Specialty Exam:  Presentation  General Appearance: Disheveled  Eye Contact:Fair  Speech:Normal Rate; Clear and Coherent  Speech Volume:Normal  Handedness:Right   Mood and Affect   Mood:Anxious  Affect:Congruent   Thought Process  Thought Processes:Disorganized  Descriptions of Associations:Tangential  Orientation:Full (Time, Place and Person)  Thought Content:Tangential  History of Schizophrenia/Schizoaffective disorder:No  Duration of Psychotic Symptoms:Greater than six months (Since August 2022)  Hallucinations:No data recorded Ideas of Reference:Paranoia  Suicidal Thoughts:No data recorded Homicidal Thoughts:No data recorded  Sensorium  Memory:Immediate [place]   Executive Functions  Concentration:Fair  Attention Span:Fair  [place] of Knowledge:Good  Language:Good   Psychomotor Activity  Psychomotor Activity:No data recorded  Assets  Assets:Communication Skills; Leisure Time; Social Support; Resilience   Sleep  Sleep:No data recorded  Physical Exam: Physical Exam Vitals and nursing note reviewed.  Constitutional:      Appearance: Normal appearance.  HENT:     Head: Normocephalic and atraumatic.     Mouth/Throat:     Pharynx: Oropharynx is clear.  Eyes:     Pupils: Pupils are equal, round, and reactive to light.  Cardiovascular:     Rate and Rhythm: Normal rate and regular rhythm.  Pulmonary:     Effort: Pulmonary effort is normal.     Breath sounds: Normal breath sounds.  Abdominal:     General: Abdomen is flat.     Palpations: Abdomen is soft.  Musculoskeletal:        General: Normal range of motion.  Skin:    General: Skin is warm and dry.  Neurological:     General: No focal deficit present.     Mental Status: She is alert. Mental status is at baseline.  Psychiatric:        Attention and Perception: Attention normal.        Mood and Affect: Mood normal. Affect is blunt.        Speech: Speech is delayed.        Behavior: Behavior is slowed.        Thought Content: Thought content normal.        Cognition and Memory: Memory is impaired.   Review of Systems   Constitutional: Negative.   HENT: Negative.    Eyes: Negative.   Respiratory: Negative.    Cardiovascular: Negative.   Gastrointestinal: Negative.   Musculoskeletal: Negative.   Skin: Negative.   Neurological: Negative.   Psychiatric/Behavioral:  Positive for memory loss.   Blood pressure 108/87, pulse 80, temperature 97.8 F (36.6 C), temperature source Oral, resp. rate 19, height 5\' 3"  (1.6 m), weight 60.8 kg, SpO2 98 %. Body mass index is 23.74 kg/m.  Treatment Plan Summary: Medication management and Plan this is the typical improvement pattern one sees with ECT.  Tolerating treatment well.  Confusion partially from her catatonia and the long period of time she has been only partially  conscience and from the ECT itself.  Plan is to continue ECT Monday Wednesday Friday treatment all into next week as well.  Disposition:  See note  Alethia Berthold, MD 10/15/2021 4:16 PM

## 2021-10-15 NOTE — Progress Notes (Signed)
PT Cancellation Note ? ?Patient Details ?Name: Karen [name] ?MRN: [id number] ?DOB: 27-Oct-1975 ? ? ?Cancelled Treatment:    Reason Eval/Treat Not Completed: Other (comment).  Chart reviewed.  Pt soundly sleeping in bed upon PT arrival; pt did not wake to cueing; nurse notified.  Per chart, plan for ECT treatment today.  Will re-attempt PT evaluation at a later date/time. ? ?Leitha Bleak, PT ?10/15/21, 9:26 AM ? ?

## 2021-10-15 NOTE — Anesthesia Preprocedure Evaluation (Signed)
Anesthesia Evaluation  ?Patient identified by MRN, date of birth, ID band ?Patient awake and Patient confused ? ? ? ?Reviewed: ?Allergy & Precautions, NPO status , Patient's Chart, lab work & pertinent test results, Unable to perform ROS - Chart review only ? ?Airway ?Mallampati: II ? ?TM Distance: >3 FB ?Neck ROM: full ? ? ? Dental ? ?(+) Chipped ?  ?Pulmonary ?neg pulmonary ROS, asthma , sleep apnea ,  ?  ?Pulmonary exam normal ?breath sounds clear to auscultation ? ? ? ? ? ? Cardiovascular ?Exercise Tolerance: Good ?negative cardio ROS ?Normal cardiovascular exam ?Rhythm:Regular  ? ?  ?Neuro/Psych ? Headaches, Seizures -,  Anxiety Depression negative neurological ROS ? negative psych ROS  ? GI/Hepatic ?negative GI ROS, Neg liver ROS,   ?Endo/Other  ?negative endocrine ROS ? Renal/GU ?negative Renal ROS  ?negative genitourinary ?  ?Musculoskeletal ? ?(+) Fibromyalgia -Ehlers-Danlos syndrome  ? Abdominal ?Normal abdominal exam  (+)   ?Peds ?negative pediatric ROS ?(+)  Hematology ?negative hematology ROS ?(+)   ?Anesthesia Other Findings ?Past Medical History: ?No date: Ehlers-Danlos syndrome ?No date: Fibromyalgia ?No date: Headache ?No date: Sleep apnea ?No date: Tics of organic origin ?No date: Transient alteration of awareness ? ?Past Surgical History: ?02/13/2018: COLONOSCOPY WITH PROPOFOL; N/A ?    Comment:  Procedure: COLONOSCOPY WITH PROPOFOL;  Surgeon: Allen Norris,  ?             Darren, MD;  Location: [place];  Service:  ?             Endoscopy;  Laterality: N/A; ?2009: DILATION AND CURETTAGE OF UTERUS ?02/13/2018: ESOPHAGOGASTRODUODENOSCOPY (EGD) WITH PROPOFOL ?    Comment:  Procedure: ESOPHAGOGASTRODUODENOSCOPY (EGD) WITH  ?             PROPOFOL;  Surgeon: [name], MD;  Location: [facility]  ?             ENDOSCOPY;  Service: Endoscopy;; ?1990: EYE SURGERY; Left ?    Comment:  3 Surgeries on left eye to correct crossed eye ?2003: LAPAROSCOPY; Left ?    Comment:  Fallopian  Tube ?1998: MANDIBLE SURGERY ?1987: OTHER SURGICAL HISTORY; Right ?    Comment:  3 Surgeries to repair broken arm ?No date: OTHER SURGICAL HISTORY; Left ?    Comment:  3 surgeries on her left thigh as an infant ?1998: OTHER SURGICAL HISTORY ?    Comment:  Jaw surgery ?1987: Right arm surgery ?    Comment:  x 3 due to fracture ?06/13/2019: TOENAIL EXCISION; Bilateral ?    Comment:  Procedure: BILATERAL SECOND TOE PARTIAL NAIL ABLATION;   ?             Surgeon: Erle Crocker, MD;  Location: [facility]  ?             SURGERY CENTER;  Service: Orthopedics;  Laterality:  ?             Bilateral;  SURGERY REQUEST TIME 1 HOUR ?12/13/2002: TONSILLECTOMY ? ? ? ? Reproductive/Obstetrics ?negative OB ROS ? ?  ? ? ? ? ? ? ? ? ? ? ? ? ? ?  ?  ? ? ? ? ? ? ? ? ?Anesthesia Physical ? ?Anesthesia Plan ? ?ASA: 2 ? ?Anesthesia Plan: General  ? ?Post-op Pain Management:   ? ?Induction: Intravenous ? ?PONV Risk Score and Plan: Propofol infusion and TIVA ? ?Airway Management Planned: Mask and Natural Airway ? ?Additional Equipment:  ? ?Intra-op Plan:  ? ?  Post-operative Plan:  ? ?Informed Consent: I have reviewed the patients History and Physical, chart, labs and discussed the procedure including the risks, benefits and alternatives for the proposed anesthesia with the patient or authorized representative who has indicated his/her understanding and acceptance.  ? ? ? ?Dental Advisory Given ? ?Plan Discussed with: CRNA and Surgeon ? ?Anesthesia Plan Comments:   ? ? ? ? ? ? ?Anesthesia Quick Evaluation ? ?

## 2021-10-15 NOTE — Progress Notes (Signed)
Nutrition Follow-up ? ?DOCUMENTATION CODES:  ? ?Non-severe (moderate) malnutrition in context of chronic illness ? ?INTERVENTION:  ? ?-Continue Mighty Shake TID with meals, each supplement provides 220 kcals and 6 grams ?-Initiate 48 hour calorie count; RD to follow-up on Monday, 10/18/21 for results ?-If PEG is placed, recommend ? ?Initiate Osmolite 1.5 @ 20 ml/hr and increase by 10 ml every 12 hours to goal rate of 50 ml/hr.  ?  ?45 ml Prosource TF BID.   ?  ?150 ml free water flush every 4 hours ?  ?Tube feeding regimen provides 1880 kcal (100% of needs), 97 grams of protein, and 914 ml of H2O.  Total free water: 1804 ml daily ?  ?-If feedings are started, monitor Mg, K, and Phos daily and replete as needed secondary to refeeding risk ? ?NUTRITION DIAGNOSIS:  ? ?Moderate Malnutrition related to chronic illness (psychiatric illness) as evidenced by mild fat depletion, moderate fat depletion, mild muscle depletion, moderate muscle depletion. ? ?Ongoing ? ?GOAL:  ? ?Patient will meet greater than or equal to 90% of their needs ? ?Progressing  ? ?MONITOR:  ? ?PO intake, Supplement acceptance, Labs, Weight trends, Skin, I & O's ? ?REASON FOR ASSESSMENT:  ? ?Consult ?Assessment of nutrition requirement/status ? ?ASSESSMENT:  ? ?Karen [name] is a 46 y.o. female with medical history significant of altered mental status, takes, psychosis, Ehlers-Danlos syndrome, fibromyalgia presenting by EMS for lying on the floor for last several days and not moving Per ED providers note.  Patient unable to provide history no family at bedside.  Patient has a history of psychotic episodes in the past patient has been noted to be urinating and defecating on herself per ED note.  The admission requested for urinary tract infection. ? ? 2/27- ECT treatments initiated ? ?Reviewed I/O's: +1.2 L x 24 hours and +6 L since admission ? ?UOP: 150 ml x 24 hours ? ?Case discussed with MD. Pt continues to eat poorly (PO: 0%), however,c consumed  100% of meal for dinner last night. Enteral nutrition is preferred route due to functioning GI tract. MD does not want to try to place NGT again, as pt pulled out on previous attempt. Plan to consult GI and IR for potential PEG placement.  ? ?Medications reviewed and include dextrose 5% and 0.45% NaCl with KCl mEq/L infusion @ 75 ml/hr.  ? ?Labs reviewed: CBGS: 62-131 (inpatient orders for glycemic control are none).   ? ?Diet Order:   ?Diet Order   ? ?       ?  Diet NPO time specified  Diet effective midnight       ?  ? ?  ?  ? ?  ? ? ?EDUCATION NEEDS:  ? ?Not appropriate for education at this time ? ?Skin:  Skin Assessment: Reviewed RN Assessment ? ?Last BM:  Unknown ? ?Height:  ? ?Ht Readings from Last 1 Encounters:  ?10/04/21 5\' 3"  (1.6 m)  ? ? ?Weight:  ? ?Wt Readings from Last 1 Encounters:  ?10/04/21 60.8 kg  ? ? ?Ideal Body Weight:  52.3 kg ? ?BMI:  Body mass index is 23.74 kg/m?. ? ?Estimated Nutritional Needs:  ? ?Kcal:  1800-2000 ? ?Protein:  90-105 grams ? ?Fluid:  > 1.8 L ? ? ? ?Loistine Chance, RD, LDN, CDCES ?Registered Dietitian II ?Certified Diabetes Care and Education Specialist ?Please refer to [facility] for RD and/or RD on-call/weekend/after hours pager  ?

## 2021-10-16 DIAGNOSIS — R4182 Altered mental status, unspecified: Secondary | ICD-10-CM | POA: Diagnosis not present

## 2021-10-16 DIAGNOSIS — F061 Catatonic disorder due to known physiological condition: Secondary | ICD-10-CM | POA: Diagnosis not present

## 2021-10-16 DIAGNOSIS — R778 Other specified abnormalities of plasma proteins: Secondary | ICD-10-CM | POA: Diagnosis not present

## 2021-10-16 DIAGNOSIS — F418 Other specified anxiety disorders: Secondary | ICD-10-CM | POA: Diagnosis not present

## 2021-10-16 LAB — BASIC METABOLIC PANEL
Anion gap: 8 (ref 5–15)
BUN: 15 mg/dL (ref 6–20)
CO2: 25 mmol/L (ref 22–32)
Calcium: 8.5 mg/dL — ABNORMAL LOW (ref 8.9–10.3)
Chloride: 112 mmol/L — ABNORMAL HIGH (ref 98–111)
Creatinine, Ser: 0.61 mg/dL (ref 0.44–1.00)
GFR, Estimated: >60 mL/min (ref >60)
Glucose, Bld: 123 mg/dL — ABNORMAL HIGH (ref 70–99)
Potassium: 3.9 mmol/L (ref 3.5–5.1)
Sodium: 145 mmol/L (ref 135–145)

## 2021-10-16 LAB — MAGNESIUM: Magnesium: 2.4 mg/dL (ref 1.7–2.4)

## 2021-10-16 NOTE — Progress Notes (Signed)
?PROGRESS NOTE ? ?[name]    DOB: 03-01-1976, 46 y.o.  ?KAJ:[id number]  ?  Code Status: Full Code   ?DOA: 10/04/2021   LOS: 11  ? ?Brief hospital course  ?DOMINIC RHOME is a 46 y.o. female with a PMH significant for Erler's Danlos syndrome, fibromyalgia, sleep apnea, transient alteration of awareness, tics of organic origin, headaches. ?They presented from home to the ED on 10/04/2021 with found down x several days days.  She was found lying on the floor covered with urine and feces ?In the ED, it was found that they had UTI and met sepsis criteria with, lactic acidosis.  ?They were treated with ceftriaxone and IV fluids.  ?Patient was admitted to medicine service for further workup and management of AMS as outlined in detail below. ? ?10/16/21 -stable, improved ? ?Assessment & Plan  ?Principal Problem: ?  Catatonia ?Active Problems: ?  Depression with anxiety ?  Seizure disorder ([place]) ?  Sepsis secondary to UTI [facility]) ?  AMS (altered mental status) ?  Thyroid nodule ?  Hypokalemia ?  Elevated troponin ?  Depression, major, recurrent, severe with psychosis ([place]) ?  Malnutrition of moderate degree ?  Encounter for nasogastric (NG) tube placement ?  Urinary tract infection without hematuria ? ?History of paranoid psychosis with hallucinations and delusions/catatonia ?Severe major depression and anxiety-patient presented in catatonic state and is showing improvement after ECT. Today, patient is alert, ate breakfast independently and responding appropriately to questions. She's exhibiting some confusion and memory loss. ?-Psychiatry following, appreciate recommendations ? -ECT 2/27- ? -Continue Zyprexa ?- PT/OT ? ?Sepsis-sepsis physiology has resolved.  S/p CTX for UTI ? ?Elevated troponins on admission-likely from demand ischemia.  No cardiac history, unable to assess if she had chest pain, echo unremarkable. Cardiology evaluated ? ?Failure to thrive-poor nutrition status due to poor PO intake in sedated  state and did not tolerate NG tube. Blood sugars have remained stable. Patient is NPO prior to ECT treatments. Optimistically she is eating more so will not need enteral feeds. As she is responding to treatment, will need to monitor closely for refeeding syndrome. ?- strict I/O ?- calorie count ?- nutrition consult ?-Encourage p.o. intake ?-Continue IV fluids with dextrose until consistently tolerating p.o. ? ?Thyroid nodule- thyroid ultrasound Diffusely heterogeneous and enlarged thyroid gland most consistent with diffuse goitrous change. ? - Recommend follow-up ultrasound in 1 year ? ?Body mass index is 23.74 kg/m?. ? ?VTE ppx: enoxaparin (LOVENOX) injection 40 mg Start: 10/11/21 2200 ?SCDs Start: 10/04/21 2355 ? ?Diet:  ?   ?Diet  ? Diet NPO time specified  ? Diet regular Room service appropriate? Yes; Fluid consistency: Thin  ? ?Subjective 10/16/21   ? ?Pt reports doing well this morning. She states that she ate all her breakfast. She does not remember if her mom is living or not. She knows that her mom was here last night but didn't know if it was in her imagination. She would like to live with her mom again if that's an option.  ?  ?Objective  ? ?Vitals:  ? 10/15/21 0748 10/15/21 1556 10/15/21 2053 10/16/21 0525  ?BP: 93/65 1'08/87 93/68 96/74 '  ?Pulse: (!) 57 80 90 79  ?Resp: '17 19 20 16  ' ?Temp: 98.7 ?F (37.1 ?C) 97.8 ?F (36.6 ?C) 98 ?F (36.7 ?C) (!) 97.5 ?F (36.4 ?C)  ?TempSrc: Axillary Oral Oral Oral  ?SpO2: 99% 98% 97% 99%  ?Weight:      ?Height:      ? ? ?  Intake/Output Summary (Last 24 hours) at 10/16/2021 0806 ?Last data filed at 10/16/2021 0700 ?Gross per 24 hour  ?Intake 1545.53 ml  ?Output --  ?Net 1545.53 ml  ? ? ?Filed Weights  ? 10/04/21 1900  ?Weight: 60.8 kg  ?  ? ?Physical Exam:  ?General: alert, awake, NAD ?HEENT: atraumatic, clear conjunctiva, anicteric sclera, MMM ?Respiratory: normal respiratory effort. ?Cardiovascular: normal S1/S2, RRR, no JVD, murmurs, quick capillary refill  ?Nervous: oriented  to self and place ?Skin: dry, intact, normal temperature, normal color. No rashes, lesions or ulcers on exposed skin ?Psych: memory deficits. She's unsure if mother at hospital yesterday was from her imagination or real. Answering questions appropriately ? ?Labs   ?I have personally reviewed the following labs and imaging studies ?CBC ?   ?Component Value Date/Time  ? WBC 9.4 10/15/2021 0443  ? RBC 5.00 10/15/2021 0443  ? HGB 13.8 10/15/2021 0443  ? HGB 12.8 10/15/2019 1023  ? HCT 42.2 10/15/2021 0443  ? HCT 37.9 10/15/2019 1023  ? PLT 360 10/15/2021 0443  ? PLT 256 10/15/2019 1023  ? MCV 84.4 10/15/2021 0443  ? MCV 85 10/15/2019 1023  ? MCV 92 02/19/2014 0035  ? MCH 27.6 10/15/2021 0443  ? MCHC 32.7 10/15/2021 0443  ? RDW 17.2 (H) 10/15/2021 0443  ? RDW 13.3 10/15/2019 1023  ? RDW 13.8 02/19/2014 0035  ? LYMPHSABS 1.8 10/04/2021 1901  ? LYMPHSABS 2.7 10/15/2019 1023  ? LYMPHSABS 3.5 02/19/2014 0035  ? MONOABS 0.6 10/04/2021 1901  ? MONOABS 0.5 02/19/2014 0035  ? EOSABS 0.1 10/04/2021 1901  ? EOSABS 0.3 10/15/2019 1023  ? EOSABS 0.9 (H) 02/19/2014 0035  ? BASOSABS 0.0 10/04/2021 1901  ? BASOSABS 0.0 10/15/2019 1023  ? BASOSABS 0.1 02/19/2014 0035  ? ?BMP Latest Ref Rng & Units 10/16/2021 10/15/2021 10/12/2021  ?Glucose 70 - 99 mg/dL 123(H) 112(H) 124(H)  ?BUN 6 - 20 mg/dL '15 7 6  ' ?Creatinine 0.44 - 1.00 mg/dL 0.61 0.68 0.85  ?BUN/Creat Ratio 9 - 23 - - -  ?Sodium 135 - 145 mmol/L 145 141 144  ?Potassium 3.5 - 5.1 mmol/L 3.9 3.7 3.5  ?Chloride 98 - 111 mmol/L 112(H) 106 109  ?CO2 22 - 32 mmol/L '25 25 26  ' ?Calcium 8.9 - 10.3 mg/dL 8.5(L) 9.2 9.3  ? ? ?No results found. ? ?Disposition Plan & Communication  ?Patient status: Inpatient  ?Admitted From: Home ?Planned disposition location: TBD ?Anticipated discharge date: TBD pending continuing ECT therapy, rough estimate would be 3/10 and might need inpatient psychiatry admission ? ?Family Communication: none  ?  ?Author: ?Richarda Osmond, DO ?[facility] ?10/16/2021,  8:06 AM  ? ?Available by Epic secure chat 7AM-7PM. ?If 7PM-7AM, please contact night-coverage.  ?[facility] contact information found on [email or web address]. ? ?

## 2021-10-16 NOTE — Evaluation (Signed)
Occupational Therapy Evaluation ?Patient Details ?Name: Karen [name] ?MRN: [id number] ?DOB: Apr 28, 1976 ?Today's Date: 10/16/2021 ? ? ?History of Present Illness Pt admitted to [facility] on 10/04/21 after she was found down on the floor for several days, covered in urine and feces. Pt found to have UTI and met sepsis criteria with, lactic acidosis. Catatonic state being addressed via ECT. PMH significant for: depression, anxiety, seizure, thyroid nodule, hypokalemia, malnutrition, paranoid psychosis with hallucinations.  ? ?Clinical Impression ?  ?Pt seen for OT evaluation this date in setting of acute hospitalization d/t catatonia. She is rousable today, but requires increased processing time and has minimal verbal communication with therapist. She follows at least 50% of simple one step commands. At baseline, per patient, she is able to pivot to power w/c MOD I, but endorses occasionally needing some help from her mother. She lives in a ramp accessible home with her mother. She is currently able to perform bed level UB ADLs with MIN A, and requires TOTAL A for bed level LB. MOD A for sup to long sitting, and declines further OOB activity on OT assessment. She is repositioned in bed to help relieve pressure and educated about importance of OOB activity to prevent skin breakdown. She's left with all needs met and in reach. Will continue to follow acutely. At this time, recommend f/u with OT services in STR setting.  ?   ? ?Recommendations for follow up therapy are one component of a multi-disciplinary discharge planning process, led by the attending physician.  Recommendations may be updated based on patient status, additional functional criteria and insurance authorization.  ? ?Follow Up Recommendations ? Skilled nursing-short term rehab (<3 hours/day)  ?  ?Assistance Recommended at Discharge Frequent or constant Supervision/Assistance  ?Patient can return home with the following A lot of help with walking and/or  transfers;A lot of help with bathing/dressing/bathroom ? ?  ?Functional Status Assessment ? Patient has had a recent decline in their functional status and demonstrates the ability to make significant improvements in function in a reasonable and predictable amount of time.  ?Equipment Recommendations ? Other (comment) (defer)  ?  ?Recommendations for Other Services   ? ? ?  ?Precautions / Restrictions Precautions ?Precautions: Fall ?Restrictions ?Weight Bearing Restrictions: No  ? ?  ? ?Mobility Bed Mobility ?  ?  ?  ?  ?  ?  ?  ?General bed mobility comments: MOD A sup to long sitting declines further OOB mobility on OT assessment ?  ? ?Transfers ?  ?  ?  ?  ?  ?  ?  ?  ?  ?  ?  ? ?  ?Balance Overall balance assessment: Needs assistance ?Sitting-balance support: Feet supported, Bilateral upper extremity supported ?Sitting balance-Leahy Scale: Fair ?  ?  ?  ?  ?  ?  ?  ?  ?  ?  ?  ?  ?  ?  ?  ?  ?   ? ?ADL either performed or assessed with clinical judgement  ? ?ADL   ?  ?  ?  ?  ?  ?  ?  ?  ?  ?  ?  ?  ?  ?  ?  ?  ?  ?  ?  ?General ADL Comments: Able to perform bed level UB ADLs with MIN A, TOTAL A for bed level LB. MOD A for sup to long sitting, declines further OOB activity.  ? ? ? ?Vision Patient Visual Report: No change from  baseline ?Additional Comments: tracks appropriately when attending, but difficult to formally assess d/t lethargy  ?   ?Perception   ?  ?Praxis   ?  ? ?Pertinent Vitals/Pain Pain Assessment ?Pain Assessment: PAINAD ?Breathing: normal ?Negative Vocalization: none ?Facial Expression: smiling or inexpressive ?Body Language: relaxed ?Consolability: no need to console ?PAINAD Score: 0  ? ? ? ?Hand Dominance   ?  ?Extremity/Trunk Assessment Upper Extremity Assessment ?Upper Extremity Assessment:  (Grossly 3+/5; sensation intact) ?  ?Lower Extremity Assessment ?Lower Extremity Assessment:  (Grossly 3+/5; sensation intact) ?  ?Cervical / Trunk Assessment ?Cervical / Trunk Assessment: Normal ?   ?Communication Communication ?Communication: No difficulties ?  ?Cognition Arousal/Alertness: Awake/alert, Lethargic (able to rouse, but minimal communication/interaction) ?Behavior During Therapy: Flat affect ?Overall Cognitive Status: History of cognitive impairments - at baseline ?  ?  ?  ?  ?  ?  ?  ?  ?  ?  ?  ?  ?  ?  ?  ?  ?General Comments: Unable to complete A&O questions due to limited verbal communication; able to follow ~50% of simple 1-step commands, with increased time for processing of task. ?  ?  ?General Comments    ? ?  ?Exercises Other Exercises ?Other Exercises: OT ed re: importance of OOB activity to prevent atrophy. ?  ?Shoulder Instructions    ? ? ?Home Living Family/patient expects to be discharged to:: Private residence ?Living Arrangements: Parent (mother) ?Available Help at Discharge: Family;Available 24 hours/day ?Type of Home: House ?Home Access: Ramped entrance ?  ?  ?Home Layout: One level ?  ?  ?Bathroom Shower/Tub: Tub/shower unit ?  ?Bathroom Toilet: Standard ?  ?  ?Home Equipment: Shower seat;Grab bars - tub/shower;Wheelchair - power ?  ?Additional Comments: Has golden doodle service dog ?  ? ?  ?Prior Functioning/Environment Prior Level of Function : Needs assist ?  ?  ?  ?  ?  ?  ?Mobility Comments: Modified independent with stand pivot transfers; uses motorized w/c (has used w/c since she was 46 y.o.) ?ADLs Comments: Assist for cooking and cleaning; pt showers on her own (has service dog present) ?  ? ?  ?  ?OT Problem List: Decreased strength;Decreased activity tolerance ?  ?   ?OT Treatment/Interventions: Self-care/ADL training;Therapeutic exercise;Therapeutic activities;DME and/or AE instruction;Balance training  ?  ?OT Goals(Current goals can be found in the care plan section) Acute Rehab OT Goals ?Patient Stated Goal: go home ?OT Goal Formulation: With patient ?Time For Goal Achievement: 10/30/21 ?Potential to Achieve Goals: Good ?ADL Goals ?Pt Will Transfer to Toilet:  with min assist;stand pivot transfer;bedside commode ?Pt Will Perform Toileting - Clothing Manipulation and hygiene: with min assist;sitting/lateral leans ?Pt/caregiver will Perform Home Exercise Program: Increased strength;Both right and left upper extremity;With minimal assist  ?OT Frequency: Min 2X/week ?  ? ?Co-evaluation   ?  ?  ?  ?  ? ?  ?AM-PAC OT "6 Clicks" Daily Activity     ?Outcome Measure Help from another person eating meals?: A Lot ?Help from another person taking care of personal grooming?: A Lot ?Help from another person toileting, which includes using toliet, bedpan, or urinal?: Total ?Help from another person bathing (including washing, rinsing, drying)?: Total ?Help from another person to put on and taking off regular upper body clothing?: A Lot ?Help from another person to put on and taking off regular lower body clothing?: Total ?6 Click Score: 9 ?  ?End of Session   ? ?Activity Tolerance: Patient tolerated treatment  well;Patient limited by lethargy ?Patient left: in bed;with call bell/phone within reach;with bed alarm set ? ?OT Visit Diagnosis: Muscle weakness (generalized) (M62.81);Other symptoms and signs involving the nervous system (R29.898)  ?              ?Time: 0174-9449 ?OT Time Calculation (min): 24 min ?Charges:  OT General Charges ?$OT Visit: 1 Visit ?OT Evaluation ?$OT Eval Moderate Complexity: 1 Mod ?OT Treatments ?$Self Care/Home Management : 8-22 mins ? ?Gerrianne Scale, Mellen, OTR/L ?ascom [phone] ?10/16/21, 4:54 PM  ?

## 2021-10-16 NOTE — Consult Note (Signed)
PHARMACY CONSULT NOTE ? ?Pharmacy Consult for Electrolyte Monitoring and Replacement  ? ?Recent Labs: ?Potassium (mmol/L)  ?Date Value  ?10/16/2021 3.9  ?02/19/2014 3.9  ? ?Magnesium (mg/dL)  ?Date Value  ?10/16/2021 2.4  ? ?Calcium (mg/dL)  ?Date Value  ?10/16/2021 8.5 (L)  ? ?Calcium, Total (mg/dL)  ?Date Value  ?02/19/2014 9.0  ? ?Albumin (g/dL)  ?Date Value  ?10/04/2021 3.6  ?10/15/2019 4.5  ?02/19/2014 3.6  ? ?Phosphorus (mg/dL)  ?Date Value  ?10/15/2021 4.5  ? ?Sodium (mmol/L)  ?Date Value  ?10/16/2021 145  ?10/15/2019 140  ?02/19/2014 140  ? ?Assessment: ?Pharmacy has been consulted to monitor and replace electrolytes in 45yo patient admitted with sepsis secondary to UTI. She is currently in a catatonic state and per psychology, will start patient on ECT. Patient with significant weight loss of more than 30 pounds. Dietician also following. ?-failure to thrive ? ?Patient pulled out NG tube x 2 on 2/23- not planning on replacing ? ?Diet: NPO 3/3 for ECT ?MIVF: D5-1/2NS + 20 mEq K/L at 75 cc/hr ? ?Goal of Therapy:  ?Electrolytes within normal limits ? ?Plan:  ?No supplementation currently needed ?Will CTM d/t concern for refeed. ?--Once patient tolerating diet; closer Phos monitoring may be indicated to watch for re-feeding ?--Will continue to follow along ? ?Patrik Turnbaugh A Linah Klapper ?10/16/2021 7:52 AM ? ?

## 2021-10-16 NOTE — Evaluation (Signed)
Physical Therapy Evaluation ?Patient Details ?Name: Karen [name] ?MRN: [id number] ?DOB: 09-01-1975 ?Today's Date: 10/16/2021 ? ?History of Present Illness ? Pt admitted to [facility] on 10/04/21 after she was found down on the floor for several days, covered in urine and feces. Pt found to have UTI and met sepsis criteria with, lactic acidosis. Catatonic state being addressed via ECT. PMH significant for: depression, anxiety, seizure, thyroid nodule, hypokalemia, malnutrition, paranoid psychosis with hallucinations. ?  ?Clinical Impression ? Pt is a 46 year old F admitted to hospital on 10/04/21 for catatonia. Per PT conversation with pt's caregiver, at baseline, pt was mod I for stand pivot transfers to/from [facility] and for bathing with service dog; mom to assist with IADL's. Pt presents with generalized weakness, impaired processing, AMS, decreased gross balance, decreased activity tolerance, and poor safety awareness, resulting in impaired functional mobility from baseline. Due to deficits, pt required mod-max assist for bed mobility, mod assist for transfers, and min assist to take a few steps at bedside with use of RW. Increased time/effort required during session due to impaired processing, weakness, and limited verbal communication. Deficits limit the pt's ability to safely and independently perform ADL's, transfer, and ambulate. Pt will benefit from acute skilled PT services to address deficits for return to baseline function. At this time, PT recommends HHPT to address deficits and improve overall safety with functional mobility for return to baseline function and decreased caregiver burden. If pt's caregiver (mom) unable to provide level of care pt currently requires, will recommend SNF at DC. ?   ?   ? ?Recommendations for follow up therapy are one component of a multi-disciplinary discharge planning process, led by the attending physician.  Recommendations may be updated based on patient status, additional  functional criteria and insurance authorization. ? ?Follow Up Recommendations Home health PT (HHPT vs. SNF; pending pt's caregiver ability to provide level of care she currently requires) ? ?  ?Assistance Recommended at Discharge Intermittent Supervision/Assistance  ?Patient can return home with the following ? A little help with walking and/or transfers;A little help with bathing/dressing/bathroom;Help with stairs or ramp for entrance ? ?  ?Equipment Recommendations None recommended by PT  ?   ?Functional Status Assessment Patient has had a recent decline in their functional status and demonstrates the ability to make significant improvements in function in a reasonable and predictable amount of time.  ? ?  ?Precautions / Restrictions Precautions ?Precautions: Fall ?Restrictions ?Weight Bearing Restrictions: No  ? ?  ? ?Mobility ? Bed Mobility ?Overal bed mobility: Needs Assistance ?Bed Mobility: Supine to Sit, Sit to Supine ?  ?  ?Supine to sit: Max assist, HOB elevated ?Sit to supine: Mod assist ?  ?General bed mobility comments: trunk/BLE facilitation for supine>sit; BLE facilitation for sit>supine; multimodal cues ?  ? ?Transfers ?Overall transfer level: Needs assistance ?Equipment used: Rolling walker (2 wheels) ?Transfers: Sit to/from Stand ?Sit to Stand: Mod assist, From elevated surface ?  ?  ?  ?  ?  ?General transfer comment: cues for RW and hand placement ?  ? ?Ambulation/Gait ?Ambulation/Gait assistance: Min assist ?Gait Distance (Feet): 2 Feet (2 steps x2) ?Assistive device: Rolling walker (2 wheels) ?  ?  ?  ?  ?General Gait Details: bil knee buckling after taking 2 steps, requiring quick/safe return to sitting EOB. decreased foot clearance, required assist for RW negotiation. ? ?  ? ?Balance Overall balance assessment: Needs assistance ?Sitting-balance support: Feet supported, Bilateral upper extremity supported ?Sitting balance-Leahy Scale: Fair ?  ?  ?  ?  Standing balance-Leahy Scale: Poor ?Standing  balance comment: min assist for standing ?  ?  ?  ?  ?  ?  ?  ?  ?  ?  ?  ?   ? ? ? ?Pertinent Vitals/Pain Pain Assessment ?Breathing: normal ?Negative Vocalization: none ?Facial Expression: smiling or inexpressive ?Body Language: relaxed ?Consolability: no need to console ?PAINAD Score: 0  ? ? ?Home Living Family/patient expects to be discharged to:: Private residence ?Living Arrangements: Parent (pt's mom) ?Available Help at Discharge: Family;Available 24 hours/day ?Type of Home: House ?Home Access: Ramped entrance ?  ?  ?  ?Home Layout: One level ?Home Equipment: Shower seat;Grab bars - tub/shower;Wheelchair - power ?Additional Comments: Has golden doodle service dog  ?  ?Prior Function Prior Level of Function : Needs assist ?  ?  ?  ?  ?  ?  ?Mobility Comments: Modified independent with stand pivot transfers; uses motorized w/c (has used w/c since she was 46 y.o.) ?ADLs Comments: Assist for cooking and cleaning; pt showers on her own (has service dog present) ?  ? ? ?   ?Extremity/Trunk Assessment  ? Upper Extremity Assessment ?Upper Extremity Assessment: Generalized weakness (Grossly 3+/5; sensation intact) ?  ? ?Lower Extremity Assessment ?Lower Extremity Assessment: Generalized weakness (Grossly 3+/5; sensation intact) ?  ? ?Cervical / Trunk Assessment ?Cervical / Trunk Assessment: Normal  ?Communication  ? Communication: No difficulties  ?Cognition Arousal/Alertness: Awake/alert ?Behavior During Therapy: Flat affect ?Overall Cognitive Status: History of cognitive impairments - at baseline ?  ?  ?  ?  ?  ?  ?  ?  ?  ?  ?  ?  ?  ?  ?  ?  ?General Comments: Unable to complete A&O questions due to limited verbal communication; able to follow ~50% of simple 1-step commands, with increased time for processing of task. ?  ?  ? ?  ?General Comments   ? ?  ?Exercises Other Exercises ?Other Exercises: Bed mobility, transfers, and minimal gait. Unable to tolerate standing >10s without knee buckling. Increased  time/effort during session due to AMS. Educated re: PT role/POC, DC recommendations, safety with mobility.  ? ?Assessment/Plan  ?  ?PT Assessment Patient needs continued PT services  ?PT Problem List Decreased strength;Decreased activity tolerance;Decreased balance;Decreased mobility;Decreased cognition;Decreased safety awareness ? ?   ?  ?PT Treatment Interventions DME instruction;Gait training;Functional mobility training;Therapeutic activities;Therapeutic exercise;Balance training;Neuromuscular re-education   ? ?PT Goals (Current goals can be found in the Care Plan section)  ?Acute Rehab PT Goals ?PT Goal Formulation: Patient unable to participate in goal setting ? ?  ?Frequency Min 2X/week ?  ? ? ?   ?AM-PAC PT "6 Clicks" Mobility  ?Outcome Measure Help needed turning from your back to your side while in a flat bed without using bedrails?: A Lot ?Help needed moving from lying on your back to sitting on the side of a flat bed without using bedrails?: A Lot ?Help needed moving to and from a bed to a chair (including a wheelchair)?: A Lot ?Help needed standing up from a chair using your arms (e.g., wheelchair or bedside chair)?: A Lot ?Help needed to walk in hospital room?: Total ?Help needed climbing 3-5 steps with a railing? : Total ?6 Click Score: 10 ? ?  ?End of Session Equipment Utilized During Treatment: Gait belt ?Activity Tolerance: Patient tolerated treatment well;Patient limited by fatigue ?Patient left: in bed;with call bell/phone within reach;with bed alarm set (seizure precautions) ?Nurse Communication: Mobility status ?PT Visit Diagnosis:  Unsteadiness on feet (R26.81);Muscle weakness (generalized) (M62.81) ?  ? ?Time: 0165-5374 ?PT Time Calculation (min) (ACUTE ONLY): 21 min ? ? ?Charges:   PT Evaluation ?$PT Eval Low Complexity: 1 Low ?PT Treatments ?$Therapeutic Activity: 8-22 mins ?  ?   ? ?Herminio Commons, PT, DPT ?2:51 PM,10/16/21 ? ? ?

## 2021-10-17 DIAGNOSIS — F333 Major depressive disorder, recurrent, severe with psychotic symptoms: Secondary | ICD-10-CM | POA: Diagnosis not present

## 2021-10-17 DIAGNOSIS — F061 Catatonic disorder due to known physiological condition: Secondary | ICD-10-CM | POA: Diagnosis not present

## 2021-10-17 DIAGNOSIS — E876 Hypokalemia: Secondary | ICD-10-CM | POA: Diagnosis not present

## 2021-10-17 DIAGNOSIS — R4182 Altered mental status, unspecified: Secondary | ICD-10-CM | POA: Diagnosis not present

## 2021-10-17 LAB — BASIC METABOLIC PANEL
Anion gap: 11 (ref 5–15)
BUN: 13 mg/dL (ref 6–20)
CO2: 24 mmol/L (ref 22–32)
Calcium: 8.9 mg/dL (ref 8.9–10.3)
Chloride: 107 mmol/L (ref 98–111)
Creatinine, Ser: 0.59 mg/dL (ref 0.44–1.00)
GFR, Estimated: >60 mL/min (ref >60)
Glucose, Bld: 118 mg/dL — ABNORMAL HIGH (ref 70–99)
Potassium: 3.9 mmol/L (ref 3.5–5.1)
Sodium: 142 mmol/L (ref 135–145)

## 2021-10-17 LAB — MAGNESIUM: Magnesium: 2.1 mg/dL (ref 1.7–2.4)

## 2021-10-17 LAB — PHOSPHORUS: Phosphorus: 3.7 mg/dL (ref 2.5–4.6)

## 2021-10-17 NOTE — Progress Notes (Signed)
?PROGRESS NOTE ? ?Karen [name]    DOB: May 25, 1976, 46 y.o.  ?LNL:[id number]  ?  Code Status: Full Code   ?DOA: 10/04/2021   LOS: 12  ? ?Brief hospital course  ?Karen [name] is a 46 y.o. female with a PMH significant for Erler's Danlos syndrome, fibromyalgia, sleep apnea, transient alteration of awareness, tics of organic origin, headaches. ?They presented from home to the ED on 10/04/2021 with found down x several days days.  She was found lying on the floor covered with urine and feces ?In the ED, it was found that they had UTI and met sepsis criteria with, lactic acidosis.  ?They were treated with ceftriaxone and IV fluids.  ?Patient was admitted to medicine service for further workup and management of AMS as outlined in detail below. ? ?10/17/21 -stable ? ?Assessment & Plan  ?Principal Problem: ?  Catatonia ?Active Problems: ?  Depression with anxiety ?  Seizure disorder ([place]) ?  Sepsis secondary to UTI [facility]) ?  AMS (altered mental status) ?  Thyroid nodule ?  Hypokalemia ?  Elevated troponin ?  Depression, major, recurrent, severe with psychosis ([place]) ?  Malnutrition of moderate degree ?  Encounter for nasogastric (NG) tube placement ?  Urinary tract infection without hematuria ? ?History of paranoid psychosis with hallucinations and delusions/catatonia ?Severe major depression and anxiety-patient presented in catatonic state and is showing improvement after ECT. Today, patient is not alert or responsive to conversation. She has not eaten her breakfast but was able to eat and was very interactive yesterday. I'm hoping that she will again show significant improvement after next ECT scheduled tomorrow. Seems that she continues to have longer periods of alertness progressing with more treatments.  ?-Psychiatry following, appreciate recommendations ? -ECT 2/27- ? -Continue Zyprexa ?- PT/OT ? - SNF vs home with mom. Patient may also potentially need dc to psychiatric inpatient stay but will need to be  discussed with psych and patient/mom when she is more alert consistently ? ?Sepsis-sepsis physiology has resolved.  S/p CTX for UTI ? ?Elevated troponins on admission-likely from demand ischemia.  No cardiac history, unable to assess if she had chest pain, echo unremarkable. Cardiology evaluated ? ?Failure to thrive-poor nutrition status due to poor PO intake in sedated state and did not tolerate NG tube. Blood sugars have remained stable. Patient is NPO prior to ECT treatments. Optimistically she is eating more so will not need enteral feeds. As she is responding to treatment, will need to monitor closely for refeeding syndrome. ?Phosphorus, magnesium normal today ?- strict I/O ?- calorie count ?- nutrition consult ?-Encourage p.o. intake ?-Continue IV fluids with dextrose until consistently tolerating p.o. ? ?Thyroid nodule- thyroid ultrasound Diffusely heterogeneous and enlarged thyroid gland most consistent with diffuse goitrous change. ? - Recommend follow-up ultrasound in 1 year ? ?Body mass index is 23.78 kg/m?. ? ?VTE ppx: enoxaparin (LOVENOX) injection 40 mg Start: 10/11/21 2200 ?SCDs Start: 10/04/21 2355 ? ?Diet:  ?   ?Diet  ? Diet NPO time specified  ? Diet regular Room service appropriate? Yes; Fluid consistency: Thin  ? ?Subjective 10/17/21   ? ?Pt reports no complaints. She was unresponsive today ?  ?Objective  ? ?Vitals:  ? 10/16/21 1655 10/16/21 2115 10/17/21 0326 10/17/21 0500  ?BP: 102/71 102/74 104/78   ?Pulse: 87 (!) 102 84   ?Resp: '16 18 19   ' ?Temp: 99.7 ?F (37.6 ?C) 97.7 ?F (36.5 ?C) 98.6 ?F (37 ?C)   ?TempSrc:  Oral    ?  SpO2: 99% 98% 100%   ?Weight:    60.9 kg  ?Height:      ? ? ?Intake/Output Summary (Last 24 hours) at 10/17/2021 0718 ?Last data filed at 10/17/2021 0630 ?Gross per 24 hour  ?Intake 2242.54 ml  ?Output 2000 ml  ?Net 242.54 ml  ? ? ?Filed Weights  ? 10/04/21 1900 10/17/21 0500  ?Weight: 60.8 kg 60.9 kg  ?  ? ?Physical Exam:  ?General: asleep, NAD ?HEENT: atraumatic, clear  conjunctiva, anicteric sclera, MMM ?Respiratory: normal respiratory effort. ?Cardiovascular: normal S1/S2, RRR, no JVD, murmurs, quick capillary refill  ?Nervous: not responsive to attempts to awaken ?Skin: dry, intact, normal temperature, normal color. No rashes, lesions or ulcers on exposed skin ? ?Labs   ?I have personally reviewed the following labs and imaging studies ?CBC ?   ?Component Value Date/Time  ? WBC 9.4 10/15/2021 0443  ? RBC 5.00 10/15/2021 0443  ? HGB 13.8 10/15/2021 0443  ? HGB 12.8 10/15/2019 1023  ? HCT 42.2 10/15/2021 0443  ? HCT 37.9 10/15/2019 1023  ? PLT 360 10/15/2021 0443  ? PLT 256 10/15/2019 1023  ? MCV 84.4 10/15/2021 0443  ? MCV 85 10/15/2019 1023  ? MCV 92 02/19/2014 0035  ? MCH 27.6 10/15/2021 0443  ? MCHC 32.7 10/15/2021 0443  ? RDW 17.2 (H) 10/15/2021 0443  ? RDW 13.3 10/15/2019 1023  ? RDW 13.8 02/19/2014 0035  ? LYMPHSABS 1.8 10/04/2021 1901  ? LYMPHSABS 2.7 10/15/2019 1023  ? LYMPHSABS 3.5 02/19/2014 0035  ? MONOABS 0.6 10/04/2021 1901  ? MONOABS 0.5 02/19/2014 0035  ? EOSABS 0.1 10/04/2021 1901  ? EOSABS 0.3 10/15/2019 1023  ? EOSABS 0.9 (H) 02/19/2014 0035  ? BASOSABS 0.0 10/04/2021 1901  ? BASOSABS 0.0 10/15/2019 1023  ? BASOSABS 0.1 02/19/2014 0035  ? ?BMP Latest Ref Rng & Units 10/16/2021 10/15/2021 10/12/2021  ?Glucose 70 - 99 mg/dL 123(H) 112(H) 124(H)  ?BUN 6 - 20 mg/dL '15 7 6  ' ?Creatinine 0.44 - 1.00 mg/dL 0.61 0.68 0.85  ?BUN/Creat Ratio 9 - 23 - - -  ?Sodium 135 - 145 mmol/L 145 141 144  ?Potassium 3.5 - 5.1 mmol/L 3.9 3.7 3.5  ?Chloride 98 - 111 mmol/L 112(H) 106 109  ?CO2 22 - 32 mmol/L '25 25 26  ' ?Calcium 8.9 - 10.3 mg/dL 8.5(L) 9.2 9.3  ? ? ?No results found. ? ?Disposition Plan & Communication  ?Patient status: Inpatient  ?Admitted From: Home ?Planned disposition location: TBD ?Anticipated discharge date: TBD pending continuing ECT therapy, rough estimate would be 3/10 and might need inpatient psychiatry admission ? ?Family Communication: VM for mom ?   ?Author: ?Richarda Osmond, DO ?[facility] ?10/17/2021, 7:18 AM  ? ?Available by Epic secure chat 7AM-7PM. ?If 7PM-7AM, please contact night-coverage.  ?[facility] contact information found on [email or web address]. ? ?

## 2021-10-17 NOTE — Progress Notes (Addendum)
PT Cancellation Note ? ?Patient Details ?Name: Karen [name] ?MRN: [id number] ?DOB: [birth year] ? ? ?Cancelled Treatment:    Reason Eval/Treat Not Completed: Patient's level of consciousness. Pt cleared by RN. Supine in bed upon entry, asleep. Unable to arouse with sternal rub or noxious stimuli. Will follow up with therapy intervention later today, as appropriate. ? ?ADDENDUM 1323: Attempted therapy again. Pt continues to be asleep in supine. Unable to arouse with sternal rub or noxious stimuli. Will follow up with therapy intervention when pt is more alert and able to participate. ? ? ? ?Herminio Commons, PT, DPT ?9:29 AM,10/17/21 ? ? ?Herminio Commons, PT, DPT ?1:24 PM,10/17/21 ? ? ?

## 2021-10-17 NOTE — Consult Note (Signed)
PHARMACY CONSULT NOTE ? ?Pharmacy Consult for Electrolyte Monitoring and Replacement  ? ?Recent Labs: ?Potassium (mmol/L)  ?Date Value  ?10/17/2021 3.9  ?02/19/2014 3.9  ? ?Magnesium (mg/dL)  ?Date Value  ?10/17/2021 2.1  ? ?Calcium (mg/dL)  ?Date Value  ?10/17/2021 8.9  ? ?Calcium, Total (mg/dL)  ?Date Value  ?02/19/2014 9.0  ? ?Albumin (g/dL)  ?Date Value  ?10/04/2021 3.6  ?10/15/2019 4.5  ?02/19/2014 3.6  ? ?Phosphorus (mg/dL)  ?Date Value  ?10/17/2021 3.7  ? ?Sodium (mmol/L)  ?Date Value  ?10/17/2021 142  ?10/15/2019 140  ?02/19/2014 140  ? ?Assessment: ?Pharmacy has been consulted to monitor and replace electrolytes in 46yo patient admitted with sepsis secondary to UTI. She is currently in a catatonic state and per psychology, will start patient on ECT. Patient with significant weight loss of more than 30 pounds. Dietician also following. ?-failure to thrive ? ?Patient pulled out NG tube x 2 on 2/23- not planning on replacing ? ?Diet: NPO 3/3 for ECT ?MIVF: D5-1/2NS + 20 mEq K/L at 75 cc/hr ? ?Goal of Therapy:  ?Electrolytes within normal limits ? ?Plan:  ?No supplementation currently needed ?Will CTM d/t concern for refeed. ?--Once patient tolerating diet; closer Phos monitoring may be indicated to watch for re-feeding ?--Will continue to follow along ? ?Jayvian Escoe A Rooney Swails ?10/17/2021 8:19 AM ? ?

## 2021-10-18 ENCOUNTER — Inpatient Hospital Stay: Payer: Medicare Other | Admitting: Registered Nurse

## 2021-10-18 ENCOUNTER — Inpatient Hospital Stay (HOSPITAL_COMMUNITY)
Admit: 2021-10-18 | Discharge: 2021-10-18 | Disposition: A | Discharge status: Home / Self Care | Payer: Medicare Other | Attending: Internal Medicine | Admitting: Internal Medicine

## 2021-10-18 ENCOUNTER — Other Ambulatory Visit: Payer: Self-pay | Admitting: Psychiatry

## 2021-10-18 DIAGNOSIS — F418 Other specified anxiety disorders: Secondary | ICD-10-CM | POA: Diagnosis not present

## 2021-10-18 DIAGNOSIS — F333 Major depressive disorder, recurrent, severe with psychotic symptoms: Secondary | ICD-10-CM | POA: Diagnosis not present

## 2021-10-18 DIAGNOSIS — F061 Catatonic disorder due to known physiological condition: Secondary | ICD-10-CM | POA: Diagnosis not present

## 2021-10-18 DIAGNOSIS — R4182 Altered mental status, unspecified: Secondary | ICD-10-CM | POA: Diagnosis not present

## 2021-10-18 LAB — BASIC METABOLIC PANEL
Anion gap: 8 (ref 5–15)
BUN: 14 mg/dL (ref 6–20)
CO2: 24 mmol/L (ref 22–32)
Calcium: 8.6 mg/dL — ABNORMAL LOW (ref 8.9–10.3)
Chloride: 107 mmol/L (ref 98–111)
Creatinine, Ser: 0.67 mg/dL (ref 0.44–1.00)
GFR, Estimated: >60 mL/min (ref >60)
Glucose, Bld: 109 mg/dL — ABNORMAL HIGH (ref 70–99)
Potassium: 3.7 mmol/L (ref 3.5–5.1)
Sodium: 139 mmol/L (ref 135–145)

## 2021-10-18 LAB — PHOSPHORUS: Phosphorus: 4.1 mg/dL (ref 2.5–4.6)

## 2021-10-18 LAB — MAGNESIUM: Magnesium: 2.1 mg/dL (ref 1.7–2.4)

## 2021-10-18 MED ORDER — BOOST / RESOURCE BREEZE PO LIQD CUSTOM
1.0000 | Freq: Three times a day (TID) | ORAL | Status: DC
Start: 2021-10-18 — End: 2021-10-27
  Administered 2021-10-18 – 2021-10-27 (×20): 1 via ORAL

## 2021-10-18 MED ORDER — ADULT MULTIVITAMIN W/MINERALS CH
1.0000 | ORAL_TABLET | Freq: Every day | ORAL | Status: DC
  Administered 2021-10-20 – 2021-10-27 (×7): 1 via ORAL
  Filled 2021-10-18 (×9): qty 1

## 2021-10-18 MED ORDER — ONDANSETRON HCL 4 MG/2ML IJ SOLN: 4.0000 mg | Freq: Once | INTRAMUSCULAR | Status: DC | PRN

## 2021-10-18 MED ORDER — OLANZAPINE 5 MG PO TBDP
15.0000 mg | ORAL_TABLET | Freq: Every day | ORAL | Status: DC
Start: 2021-10-18 — End: 2021-10-27
  Administered 2021-10-18 – 2021-10-26 (×9): 15 mg via ORAL
  Filled 2021-10-18 (×9): qty 1

## 2021-10-18 MED ORDER — SODIUM CHLORIDE 0.9 % IV SOLN: 500.0000 mL | Freq: Once | INTRAVENOUS | Status: DC

## 2021-10-18 MED ORDER — FENTANYL CITRATE (PF) 100 MCG/2ML IJ SOLN: 25.0000 ug | INTRAMUSCULAR | Status: DC | PRN

## 2021-10-18 MED ORDER — GLYCOPYRROLATE 0.2 MG/ML IJ SOLN: 0.1000 mg | Freq: Once | INTRAMUSCULAR | Status: AC

## 2021-10-18 MED ORDER — SUCCINYLCHOLINE CHLORIDE 200 MG/10ML IV SOSY
PREFILLED_SYRINGE | INTRAVENOUS | Status: DC | PRN
Start: 2021-10-18 — End: 2021-10-18
  Administered 2021-10-18: 60 mg via INTRAVENOUS

## 2021-10-18 MED ORDER — GLYCOPYRROLATE 0.2 MG/ML IJ SOLN
INTRAMUSCULAR | Status: AC
  Administered 2021-10-18: 0.1 mg via INTRAVENOUS
  Filled 2021-10-18: qty 1

## 2021-10-18 MED ORDER — METHOHEXITAL SODIUM 100 MG/10ML IV SOSY
PREFILLED_SYRINGE | INTRAVENOUS | Status: DC | PRN
  Administered 2021-10-18: 60 mg via INTRAVENOUS

## 2021-10-18 NOTE — Progress Notes (Signed)
Nutrition Follow-up ? ?DOCUMENTATION CODES:  ? ?Non-severe (moderate) malnutrition in context of chronic illness ? ?INTERVENTION:  ? ?-D/c 48 hour calorie count ?-MVI with minerals daily ?-D/c Mighty Shake ?-Boost Breeze po TID, each supplement provides 250 kcal and 9 grams of protein  ? ?NUTRITION DIAGNOSIS:  ? ?Moderate Malnutrition related to chronic illness (psychiatric illness) as evidenced by mild fat depletion, moderate fat depletion, mild muscle depletion, moderate muscle depletion. ? ?Ongoing ? ?GOAL:  ? ?Patient will meet greater than or equal to 90% of their needs ? ?Progressing ? ?MONITOR:  ? ?PO intake, Supplement acceptance, Labs, Weight trends, Skin, I & O's ? ?REASON FOR ASSESSMENT:  ? ?Consult ?Assessment of nutrition requirement/status ? ?ASSESSMENT:  ? ?Karen [name] is a 46 y.o. female with medical history significant of altered mental status, takes, psychosis, Ehlers-Danlos syndrome, fibromyalgia presenting by EMS for lying on the floor for last several days and not moving Per ED providers note.  Patient unable to provide history no family at bedside.  Patient has a history of psychotic episodes in the past patient has been noted to be urinating and defecating on herself per ED note.  The admission requested for urinary tract infection. ? ?Reviewed I/O's: -1.5 L x 24 hours and +6.3 L since admission ? ?UOP: 1.6 L x 24 hours  ? ?Pt sleeping at time of visit. No family present at time of visit.  ? ?Pt NPO today for ECT treatments. Intake has improved over the weekend. Noted meal completions 25-100%. Pt requesting Boost drink per RN.  ? ?Limited meal completion data available, but intake has significantly improved over the past 24 hours. Per MD, plan to hold off on enteral feeds for now.  ? ?3/4-3/5 ?Breakfast: 153 kcals, 8 grams protein ?Lunch: 667 kcals, 24 hours ?Dinner: 480 kcals, 15 grams ? ?Total intake: ?1300 kcal (72% of minimum estimated needs)  ?47 grams protein (52% of minimum  estimated needs) ? ?Labs reviewed: CBGS: 124.  ? ?Diet Order:   ?Diet Order   ? ?       ?  Diet NPO time specified  Diet effective midnight       ?  ? ?  ?  ? ?  ? ? ?EDUCATION NEEDS:  ? ?Not appropriate for education at this time ? ?Skin:  Skin Assessment: Reviewed RN Assessment ? ?Last BM:  10/15/21 ? ?Height:  ? ?Ht Readings from Last 1 Encounters:  ?10/04/21 '5\' 3"'$  (1.6 m)  ? ? ?Weight:  ? ?Wt Readings from Last 1 Encounters:  ?10/17/21 60.9 kg  ? ? ?Ideal Body Weight:  52.3 kg ? ?BMI:  Body mass index is 23.78 kg/m?. ? ?Estimated Nutritional Needs:  ? ?Kcal:  1800-2000 ? ?Protein:  90-105 grams ? ?Fluid:  > 1.8 L ? ? ? ?Loistine Chance, RD, LDN, CDCES ?Registered Dietitian II ?Certified Diabetes Care and Education Specialist ?Please refer to [facility] for RD and/or RD on-call/weekend/after hours pager  ?

## 2021-10-18 NOTE — Progress Notes (Signed)
OT Cancellation Note ? ?Patient Details ?Name: Karen [name] ?MRN: [id number] ?DOB: 08-23-1975 ? ? ?Cancelled Treatment:    Reason Eval/Treat Not Completed: Patient at procedure or test/ unavailable. Pt currently off the unit for ECT. OT to re-attempt when pt is next available.  ? ?Darleen Crocker, MS, OTR/L , CBIS ?ascom [phone]  ?10/18/21, 11:47 AM  ?

## 2021-10-18 NOTE — Procedures (Signed)
ECT SERVICES ?Physician?s Interval Evaluation & Treatment Note ? ?Patient Identification: ZOANNE NEWILL ?MRN:  [id number] ?Date of Evaluation:  10/18/2021 ?TX #: [id number] ? ?MADRS:  ? ?MMSE:  ? ?P.E. Findings: ? ?No real change to physical exam except she is better able to move and follow commands at times ? ?Psychiatric Interval Note: ? ?Alert now and able to answer questions although still confused and blunted ? ?Subjective: ? ?Patient is a 46 y.o. female seen for evaluation for Electroconvulsive Therapy. ?No specific complaint ? ?Treatment Summary: ? ? '[]'$   Right Unilateral             '[x]'$  Bilateral ?  ?% Energy : 1.0 ms 40% ? ? Impedance: 2210 ohms  ? ?Seizure Energy Index: 7353 ?V squared ? ?Postictal Suppression Index: 42% ? ?Seizure Concordance Index: 96% ?Medications ? ?Pre Shock: Brevital 60 mg succinylcholine 60 mg ? ?Post Shock: Versed 2 mg ? ?Seizure Duration: 24 seconds EMG 83 seconds EEG ? ? ?Comments: ?Well tolerated.  She was awake and communicating after recovery. ? ?Lungs: ? ?'[x]'$   Clear to auscultation              ? ?'[]'$  Other:  ? ?Heart: ?  ? '[x]'$   Regular rhythm             '[]'$  irregular rhythm ? ? ? '[x]'$   Previous H&P reviewed, patient examined and there are NO CHANGES              ?  ? '[]'$   Previous H&P reviewed, patient examined and there are changes noted. ? ? ?Alethia Berthold, MD ?3/6/20235:02 PM ? ?  ?

## 2021-10-18 NOTE — Progress Notes (Signed)
?PROGRESS NOTE ? ?Karen [name]    DOB: [birth year], 46 y.o.  ?FTD:[id number]  ?  Code Status: Full Code   ?DOA: 10/04/2021   LOS: 13  ? ?Brief hospital course  ?Karen [name] is a 46 y.o. female with a PMH significant for Erler's Danlos syndrome, fibromyalgia, sleep apnea, transient alteration of awareness, tics of organic origin, headaches. ?They presented from home to the ED on 10/04/2021 with found down x several days days.  She was found lying on the floor covered with urine and feces ?In the ED, it was found that they had UTI and met sepsis criteria with, lactic acidosis.  ?They were treated with ceftriaxone and IV fluids.  ?Patient was admitted to medicine service for further workup and management of AMS as outlined in detail below. ? ?10/18/21 -stable ? ?Assessment & Plan  ?Principal Problem: ?  Catatonia ?Active Problems: ?  Depression with anxiety ?  Seizure disorder ([place]) ?  Sepsis secondary to UTI [facility]) ?  AMS (altered mental status) ?  Thyroid nodule ?  Hypokalemia ?  Elevated troponin ?  Depression, major, recurrent, severe with psychosis ([place]) ?  Malnutrition of moderate degree ?  Encounter for nasogastric (NG) tube placement ?  Urinary tract infection without hematuria ? ?History of paranoid psychosis with hallucinations and delusions/catatonia ?Severe major depression and anxiety-patient presented in catatonic state and is showing improvement after ECT. Today, patient is alert and conversant appropriately. I'm hoping that she will again show significant improvement after next ECT scheduled today. Seems that she continues to have longer periods of alertness progressing with more treatments.  ?-Psychiatry following, appreciate recommendations ? -ECT 2/27- ? -Continue Zyprexa ?- PT/OT ? - SNF vs home with mom. Patient may also potentially need dc to psychiatric inpatient stay but will need to be discussed with psych and patient/mom when she is more alert consistently ? ?Sepsis-sepsis physiology has  resolved.  S/p CTX for UTI ? ?Elevated troponins on admission-likely from demand ischemia.  No cardiac history, unable to assess if she had chest pain, echo unremarkable. Cardiology evaluated ? ?Failure to thrive-poor nutrition status due to poor PO intake in sedated state and did not tolerate NG tube. Blood sugars have remained stable. Patient is NPO prior to ECT treatments. Optimistically she is eating more so will not need enteral feeds. As she is responding to treatment, will need to monitor closely for refeeding syndrome. ?Phosphorus, magnesium normal today ?- strict I/O ?- calorie count ?- nutrition consult ?-Encourage p.o. intake ?-Continue IV fluids with dextrose until consistently tolerating p.o. ? ?Thyroid nodule- thyroid ultrasound Diffusely heterogeneous and enlarged thyroid gland most consistent with diffuse goitrous change. ? - Recommend follow-up ultrasound in 1 year ? ?Body mass index is 23.78 kg/m?. ? ?VTE ppx: enoxaparin (LOVENOX) injection 40 mg Start: 10/11/21 2200 ?SCDs Start: 10/04/21 2355 ? ?Diet:  ?   ?Diet  ? Diet NPO time specified  ? ?Subjective 10/18/21   ? ?Pt reports that she is doing well today. She denies any complaints or concerns at this time. She states that she remembers being in recovery from her previous treatments ?  ?Objective  ? ?Vitals:  ? 10/17/21 0815 10/17/21 1544 10/17/21 1954 10/18/21 0532  ?BP: 1'12/79 92/66 94/74 ' 104/75  ?Pulse: 80 89 98 81  ?Resp: '14 16 16 16  ' ?Temp: 97.7 ?F (36.5 ?C) 97.9 ?F (36.6 ?C) 98 ?F (36.7 ?C) 97.6 ?F (36.4 ?C)  ?TempSrc:   Oral Oral  ?SpO2: 100% 99% 99% 99%  ?  Weight:      ?Height:      ? ? ?Intake/Output Summary (Last 24 hours) at 10/18/2021 0720 ?Last data filed at 10/18/2021 0535 ?Gross per 24 hour  ?Intake 120 ml  ?Output 1650 ml  ?Net -1530 ml  ? ? ?Filed Weights  ? 10/04/21 1900 10/17/21 0500  ?Weight: 60.8 kg 60.9 kg  ?  ? ?Physical Exam:  ?General: awake, NAD ?HEENT: atraumatic, clear conjunctiva, anicteric sclera, MMM ?Respiratory: normal  respiratory effort. ?Cardiovascular: normal S1/S2, RRR, no JVD, murmurs, quick capillary refill  ?Nervous: alert and oriented to self and place ?Skin: dry, intact, normal temperature, normal color. No rashes, lesions or ulcers on exposed skin ? ?Labs   ?I have personally reviewed the following labs and imaging studies ?CBC ?   ?Component Value Date/Time  ? WBC 9.4 10/15/2021 0443  ? RBC 5.00 10/15/2021 0443  ? HGB 13.8 10/15/2021 0443  ? HGB 12.8 10/15/2019 1023  ? HCT 42.2 10/15/2021 0443  ? HCT 37.9 10/15/2019 1023  ? PLT 360 10/15/2021 0443  ? PLT 256 10/15/2019 1023  ? MCV 84.4 10/15/2021 0443  ? MCV 85 10/15/2019 1023  ? MCV 92 02/19/2014 0035  ? MCH 27.6 10/15/2021 0443  ? MCHC 32.7 10/15/2021 0443  ? RDW 17.2 (H) 10/15/2021 0443  ? RDW 13.3 10/15/2019 1023  ? RDW 13.8 02/19/2014 0035  ? LYMPHSABS 1.8 10/04/2021 1901  ? LYMPHSABS 2.7 10/15/2019 1023  ? LYMPHSABS 3.5 02/19/2014 0035  ? MONOABS 0.6 10/04/2021 1901  ? MONOABS 0.5 02/19/2014 0035  ? EOSABS 0.1 10/04/2021 1901  ? EOSABS 0.3 10/15/2019 1023  ? EOSABS 0.9 (H) 02/19/2014 0035  ? BASOSABS 0.0 10/04/2021 1901  ? BASOSABS 0.0 10/15/2019 1023  ? BASOSABS 0.1 02/19/2014 0035  ? ?BMP Latest Ref Rng & Units 10/18/2021 10/17/2021 10/16/2021  ?Glucose 70 - 99 mg/dL 109(H) 118(H) 123(H)  ?BUN 6 - 20 mg/dL '14 13 15  ' ?Creatinine 0.44 - 1.00 mg/dL 0.67 0.59 0.61  ?BUN/Creat Ratio 9 - 23 - - -  ?Sodium 135 - 145 mmol/L 139 142 145  ?Potassium 3.5 - 5.1 mmol/L 3.7 3.9 3.9  ?Chloride 98 - 111 mmol/L 107 107 112(H)  ?CO2 22 - 32 mmol/L '24 24 25  ' ?Calcium 8.9 - 10.3 mg/dL 8.6(L) 8.9 8.5(L)  ? ? ?No results found. ? ?Disposition Plan & Communication  ?Patient status: Inpatient  ?Admitted From: Home ?Planned disposition location: TBD ?Anticipated discharge date: TBD pending continuing ECT therapy, rough estimate would be 3/10 and might need inpatient psychiatry admission ? ?Family Communication: VM for mom ?  ?Author: ?Richarda Osmond, DO ?[facility] ?10/18/2021, 7:20  AM  ? ?Available by Epic secure chat 7AM-7PM. ?If 7PM-7AM, please contact night-coverage.  ?[facility] contact information found on [email or web address]. ? ?

## 2021-10-18 NOTE — Transfer of Care (Signed)
Immediate Anesthesia Transfer of Care Note ? ?Patient: Karen [name] ? ?Procedure(s) Performed: ECT TX ? ?Patient Location: PACU ? ?Anesthesia Type:General ? ?Level of Consciousness: drowsy ? ?Airway & Oxygen Therapy: Patient Spontanous Breathing ? ?Post-op Assessment: Report given to RN and Post -op Vital signs reviewed and stable ? ?Post vital signs: Reviewed and stable ? ?Last Vitals:  ?Vitals Value Taken Time  ?BP 93/79 10/18/21 1353  ?Temp    ?Pulse 94 10/18/21 1354  ?Resp 18 10/18/21 1354  ?SpO2 98 % 10/18/21 1354  ?Vitals shown include unvalidated device data. ? ?Last Pain:  ?Vitals:  ? 10/18/21 1125  ?TempSrc:   ?PainSc: 0-No pain  ?   ? ?  ? ?Complications: No notable events documented. ?

## 2021-10-18 NOTE — Progress Notes (Signed)
Physical Therapy Treatment ?Patient Details ?Name: Karen [name] ?MRN: [id number] ?DOB: 04-28-1976 ?Today's Date: 10/18/2021 ? ? ?History of Present Illness Pt admitted to [facility] on 10/04/21 after she was found down on the floor for several days, covered in urine and feces. Pt found to have UTI and met sepsis criteria with, lactic acidosis. Catatonic state being addressed via ECT. PMH significant for: depression, anxiety, seizure, thyroid nodule, hypokalemia, malnutrition, paranoid psychosis with hallucinations. ? ?  ?PT Comments  ? ? Pt alert and talkative during session; pt's mother present beginning/end of session.  Pt's mother verbalizing concerns regarding amount of assist pt would require at home (including self care, toileting, lifting for transfers, and 24/7 supervision) and reports she has a bad back and is unable to assist pt with transfers and questioning if rehab facility would be a good option for pt.  During session pt SBA semi-supine to sitting edge of bed; close SBA to min assist for sitting balance; and min to mod assist for transfers (sit to/from stand and stand step turn bed to recliner).  Extra time spent to set up pt comfortably in recliner with all needs in place.  Pt then reporting 7/10 headache and also dizziness--BP 110/83 sitting in recliner (pt also c/o dizziness laying in bed--pt's mother reports pt gets pain and dizziness symptoms after ECT treatments).  Pt reporting feeling comfortable in recliner though and requesting to sit up until after dinner (pt's mother already ordered pt's dinner tray)--nurse notified of pt's symptoms, pt's request to stay sitting in recliner until after dinner (pt's mother present in room with pt), and pt's request for pain meds.  Will continue to focus on strengthening, balance, and progressive functional mobility per pt tolerance. ?   ?Recommendations for follow up therapy are one component of a multi-disciplinary discharge planning process, led by the attending  physician.  Recommendations may be updated based on patient status, additional functional criteria and insurance authorization. ? ?Follow Up Recommendations ?  (HHPT vs SNF depending on level of caregiver assist available) ?  ?  ?Assistance Recommended at Discharge Frequent or constant Supervision/Assistance  ?Patient can return home with the following A little help with walking and/or transfers;A little help with bathing/dressing/bathroom;Assistance with cooking/housework;Direct supervision/assist for financial management;Assist for transportation ?  ?Equipment Recommendations ? None recommended by PT  ?  ?Recommendations for Other Services   ? ? ?  ?Precautions / Restrictions Precautions ?Precautions: Fall ?Precaution Comments: Aspiration; seizure ?Restrictions ?Weight Bearing Restrictions: No  ?  ? ?Mobility ? Bed Mobility ?Overal bed mobility: Needs Assistance ?Bed Mobility: Supine to Sit ?  ?  ?Supine to sit: Supervision, HOB elevated ?  ?  ?General bed mobility comments: mild increased effort to perform on own ?  ? ?Transfers ?Overall transfer level: Needs assistance ?Equipment used: None (UE support on therapists arms) ?Transfers: Bed to chair/wheelchair/BSC, Sit to/from Stand ?Sit to Stand: Min assist, Mod assist (x1 trial from bed and x1 trial from recliner) ?  ?Step pivot transfers: Min assist, Mod assist (stand step turn bed to recliner) ?  ?  ?  ?General transfer comment: assist to steady ?  ? ?Ambulation/Gait ?  ?  ?  ?  ?  ?  ?  ?General Gait Details: Deferred (pt normally only performs stand pivot transfers) ? ? ?Stairs ?  ?  ?  ?  ?  ? ? ?Wheelchair Mobility ?  ? ?Modified Rankin (Stroke Patients Only) ?  ? ? ?  ?Balance Overall balance assessment: Needs  assistance ?Sitting-balance support: Single extremity supported, Feet unsupported ?Sitting balance-Leahy Scale: Fair ?Sitting balance - Comments: steady static sitting but min assist for dynamic sitting balance ?  ?Standing balance support: Bilateral  upper extremity supported ?Standing balance-Leahy Scale: Poor ?Standing balance comment: min assist to steady in standing ?  ?  ?  ?  ?  ?  ?  ?  ?  ?  ?  ?  ? ?  ?Cognition Arousal/Alertness: Awake/alert ?Behavior During Therapy:  (Mostly flat but occasionally smiling) ?Overall Cognitive Status: History of cognitive impairments - at baseline ?  ?  ?  ?  ?  ?  ?  ?  ?  ?  ?  ?  ?  ?  ?  ?  ?  ?  ?  ? ?  ?Exercises   ? ?  ?General Comments  Nursing cleared pt for participation in physical therapy.  Pt agreeable to PT session. ?  ?  ? ?Pertinent Vitals/Pain Pain Assessment ?Pain Assessment: 0-10 ?Pain Score: 7  ?Pain Location: headache ?Pain Descriptors / Indicators: Headache ?Pain Intervention(s): Limited activity within patient's tolerance, Monitored during session, Repositioned, Patient requesting pain meds-RN notified ?HR 106 bpm end of session resting in recliner.  ? ? ?Home Living   ?  ?  ?  ?  ?  ?  ?  ?  ?  ?   ?  ?Prior Function    ?  ?  ?   ? ?PT Goals (current goals can now be found in the care plan section) Acute Rehab PT Goals ?PT Goal Formulation: With patient/family ?Progress towards PT goals: Progressing toward goals ? ?  ?Frequency ? ? ? Min 2X/week ? ? ? ?  ?PT Plan Current plan remains appropriate  ? ? ?Co-evaluation   ?  ?  ?  ?  ? ?  ?AM-PAC PT "6 Clicks" Mobility   ?Outcome Measure ? Help needed turning from your back to your side while in a flat bed without using bedrails?: A Little ?Help needed moving from lying on your back to sitting on the side of a flat bed without using bedrails?: A Little ?Help needed moving to and from a bed to a chair (including a wheelchair)?: A Lot ?Help needed standing up from a chair using your arms (e.g., wheelchair or bedside chair)?: A Lot ?Help needed to walk in hospital room?: Total ?Help needed climbing 3-5 steps with a railing? : Total ?6 Click Score: 12 ? ?  ?End of Session Equipment Utilized During Treatment: Gait belt ?Activity Tolerance: Patient tolerated  treatment well ?Patient left: in chair;with call bell/phone within reach;with chair alarm set;with family/visitor present;Other (comment) (B heels floating via pillow support) ?Nurse Communication: Mobility status;Precautions;Patient requests pain meds ?PT Visit Diagnosis: Unsteadiness on feet (R26.81);Muscle weakness (generalized) (M62.81) ?  ? ? ?Time: 1194-1740 ?PT Time Calculation (min) (ACUTE ONLY): 60 min ? ?Charges:  $Therapeutic Exercise: 23-37 mins ?$Therapeutic Activity: 23-37 mins          ?          ? ?Leitha Bleak, PT ?10/18/21, 4:31 PM ? ? ?

## 2021-10-18 NOTE — Anesthesia Postprocedure Evaluation (Signed)
Anesthesia Post Note ? ?Patient: Karen [name] ? ?Procedure(s) Performed: ECT TX ? ?Patient location during evaluation: PACU ?Anesthesia Type: General ?Level of consciousness: awake and alert ?Pain management: pain level controlled ?Vital Signs Assessment: post-procedure vital signs reviewed and stable ?Respiratory status: spontaneous breathing, nonlabored ventilation, respiratory function stable and patient connected to nasal cannula oxygen ?Cardiovascular status: blood pressure returned to baseline and stable ?Postop Assessment: no apparent nausea or vomiting ?Anesthetic complications: no ? ? ?No notable events documented. ? ? ?Last Vitals:  ?Vitals:  ? 10/18/21 1400 10/18/21 1415  ?BP: (!) 127/92 108/90  ?Pulse: 99 89  ?Resp: 14 (!) 26  ?Temp:  (!) 36.1 ?C  ?SpO2: 98% 97%  ?  ?Last Pain:  ?Vitals:  ? 10/18/21 1415  ?TempSrc:   ?PainSc: 0-No pain  ? ? ?  ?  ?  ?  ?  ?  ? ?[name] ? ? ? ? ?

## 2021-10-18 NOTE — Progress Notes (Signed)
PHARMACY CONSULT NOTE - FOLLOW UP ? ?Pharmacy Consult for Electrolyte Monitoring and Replacement  ? ?Recent Labs: ?Potassium (mmol/L)  ?Date Value  ?10/18/2021 3.7  ?02/19/2014 3.9  ? ?Magnesium (mg/dL)  ?Date Value  ?10/18/2021 2.1  ? ?Calcium (mg/dL)  ?Date Value  ?10/18/2021 8.6 (L)  ? ?Calcium, Total (mg/dL)  ?Date Value  ?02/19/2014 9.0  ? ?Albumin (g/dL)  ?Date Value  ?10/04/2021 3.6  ?10/15/2019 4.5  ?02/19/2014 3.6  ? ?Phosphorus (mg/dL)  ?Date Value  ?10/18/2021 4.1  ? ?Sodium (mmol/L)  ?Date Value  ?10/18/2021 139  ?10/15/2019 140  ?02/19/2014 140  ? ? ? ?Assessment: ?Pharmacy has been consulted to monitor and replace electrolytes in 46yo patient admitted with sepsis secondary to UTI. She is currently in a catatonic state and per psychology, will start patient on ECT. Patient with significant weight loss of more than 30 pounds. Dietician also following. ? ? Mighty Shake TID with meals plus calorie counting. Per MD notes, patient responding to treatment with stable BG and eating more. Today NPO for ECT, then plan to resume normal diet with aspiration precautions. ? ?Goal of Therapy:  ?Electrolytes WNL ? ?Plan:  ?Mg, K, Na, and phos all within normal limits. Corrected calcium at 8.9 will not  need replacement either. ?Pharmacy will continue to follow and replace electrolytes as needed. ? ?Gerardo Caiazzo Rodriguez-Guzman PharmD, BCPS ?10/18/2021 8:19 AM ? ? ?

## 2021-10-18 NOTE — Anesthesia Preprocedure Evaluation (Signed)
Anesthesia Evaluation  ?Patient identified by MRN, date of birth, ID band ?Patient awake ? ? ? ?Reviewed: ?Allergy & Precautions, H&P , NPO status , Patient's Chart, lab work & pertinent test results, reviewed documented beta blocker date and time  ? ?Airway ?Mallampati: II ? ? ?Neck ROM: full ? ? ? Dental ? ?(+) Poor Dentition ?  ?Pulmonary ?asthma , sleep apnea and Continuous Positive Airway Pressure Ventilation ,  ?  ?Pulmonary exam normal ? ? ? ? ? ? ? Cardiovascular ?Exercise Tolerance: Good ?negative cardio ROS ?Normal cardiovascular exam ?Rhythm:regular Rate:Normal ? ? ?  ?Neuro/Psych ? Headaches, Seizures -,  PSYCHIATRIC DISORDERS Anxiety Depression  Neuromuscular disease   ? GI/Hepatic ?negative GI ROS, Neg liver ROS,   ?Endo/Other  ?negative endocrine ROS ? Renal/GU ?negative Renal ROS  ?negative genitourinary ?  ?Musculoskeletal ? ? Abdominal ?  ?Peds ? Hematology ?negative hematology ROS ?(+)   ?Anesthesia Other Findings ?Past Medical History: ?No date: Ehlers-Danlos syndrome ?No date: Fibromyalgia ?No date: Headache ?No date: Sleep apnea ?No date: Tics of organic origin ?No date: Transient alteration of awareness ?Past Surgical History: ?02/13/2018: COLONOSCOPY WITH PROPOFOL; N/A ?    Comment:  Procedure: COLONOSCOPY WITH PROPOFOL;  Surgeon: Allen Norris,  ?             Darren, MD;  Location: [facility];  Service:  ?             Endoscopy;  Laterality: N/A; ?2009: DILATION AND CURETTAGE OF UTERUS ?02/13/2018: ESOPHAGOGASTRODUODENOSCOPY (EGD) WITH PROPOFOL ?    Comment:  Procedure: ESOPHAGOGASTRODUODENOSCOPY (EGD) WITH  ?             PROPOFOL;  Surgeon: [name], MD;  Location: [facility]  ?             ENDOSCOPY;  Service: Endoscopy;; ?1990: EYE SURGERY; Left ?    Comment:  3 Surgeries on left eye to correct crossed eye ?2003: LAPAROSCOPY; Left ?    Comment:  Fallopian Tube ?1998: MANDIBLE SURGERY ?1987: OTHER SURGICAL HISTORY; Right ?    Comment:  3 Surgeries to repair broken  arm ?No date: OTHER SURGICAL HISTORY; Left ?    Comment:  3 surgeries on her left thigh as an infant ?1998: OTHER SURGICAL HISTORY ?    Comment:  Jaw surgery ?1987: Right arm surgery ?    Comment:  x 3 due to fracture ?06/13/2019: TOENAIL EXCISION; Bilateral ?    Comment:  Procedure: BILATERAL SECOND TOE PARTIAL NAIL ABLATION;   ?             Surgeon: Erle Crocker, MD;  Location: [place]  ?             SURGERY CENTER;  Service: Orthopedics;  Laterality:  ?             Bilateral;  SURGERY REQUEST TIME 1 HOUR ?12/13/2002: TONSILLECTOMY ?BMI   ? Body Mass Index: 23.78 kg/m?  ?  ? Reproductive/Obstetrics ?negative OB ROS ? ?  ? ? ? ? ? ? ? ? ? ? ? ? ? ?  ?  ? ? ? ? ? ? ? ? ?Anesthesia Physical ?Anesthesia Plan ? ?ASA: 3 ? ?Anesthesia Plan: General  ? ?Post-op Pain Management:   ? ?Induction:  ? ?PONV Risk Score and Plan:  ? ?Airway Management Planned:  ? ?Additional Equipment:  ? ?Intra-op Plan:  ? ?Post-operative Plan:  ? ?Informed Consent: I have reviewed the patients History and Physical, chart, labs and  discussed the procedure including the risks, benefits and alternatives for the proposed anesthesia with the patient or authorized representative who has indicated his/her understanding and acceptance.  ? ? ? ?Dental Advisory Given ? ?Plan Discussed with: CRNA ? ?Anesthesia Plan Comments:   ? ? ? ? ? ? ?Anesthesia Quick Evaluation ? ?

## 2021-10-18 NOTE — Progress Notes (Signed)
Patient ID: Karen [name], female   DOB: Feb 28, 1976, 46 y.o.   MRN: [id number] ?ECT: Patient today was seen for ECT treatment.  Treatment tolerated well.  No complications.  Seemed more alert and interactive afterwards but certainly overall much better than on presentation.  I tried to call the mother this afternoon but had to leave a voicemail again.  Hopefully I will be able to get in touch with her directly soon.  Plan is to continue ECT also increase Zyprexa to 15 mg at night ?

## 2021-10-18 NOTE — H&P (Signed)
Karen [name] is an 46 y.o. female.   ?Chief Complaint: Patient still without a specific complaint but is significantly more awake and able to communicate ?HPI: History of catatonia on top of what is very likely psychotic depression ? ?Past Medical History:  ?Diagnosis Date  ? Ehlers-Danlos syndrome   ? Fibromyalgia   ? Headache   ? Sleep apnea   ? Tics of organic origin   ? Transient alteration of awareness   ? ? ?Past Surgical History:  ?Procedure Laterality Date  ? COLONOSCOPY WITH PROPOFOL N/A 02/13/2018  ? Procedure: COLONOSCOPY WITH PROPOFOL;  Surgeon: [name], MD;  Location: [facility] ENDOSCOPY;  Service: Endoscopy;  Laterality: N/A;  ? [place] OF UTERUS  2009  ? ESOPHAGOGASTRODUODENOSCOPY (EGD) WITH PROPOFOL  02/13/2018  ? Procedure: ESOPHAGOGASTRODUODENOSCOPY (EGD) WITH PROPOFOL;  Surgeon: [name], MD;  Location: [facility] ENDOSCOPY;  Service: Endoscopy;;  ? EYE SURGERY Left 1990  ? 3 Surgeries on left eye to correct crossed eye  ? LAPAROSCOPY Left 2003  ? Fallopian Tube  ? [place]  ? OTHER SURGICAL HISTORY Right 1987  ? 3 Surgeries to repair broken arm  ? OTHER SURGICAL HISTORY Left   ? 3 surgeries on her left thigh as an infant  ? OTHER SURGICAL HISTORY  1998  ? Jaw surgery  ? Right arm surgery  1987  ? x 3 due to fracture  ? TOENAIL EXCISION Bilateral 06/13/2019  ? Procedure: BILATERAL SECOND TOE PARTIAL NAIL ABLATION;  Surgeon: Erle Crocker, MD;  Location: [place];  Service: Orthopedics;  Laterality: Bilateral;  SURGERY REQUEST TIME 1 HOUR  ? TONSILLECTOMY  12/13/2002  ? ? ?Family History  ?Problem Relation Age of Onset  ? Depression Mother   ? Stroke Mother   ? Fibromyalgia Mother   ? Osteoarthritis Mother   ? Asthma Brother   ? Depression Brother   ? Migraines Brother   ? Asperger's syndrome Brother   ? Other Brother   ?     Ehlers-Danlos Syndrome  ? Cancer Maternal Aunt   ? Asperger's syndrome Maternal Aunt   ? Breast cancer Maternal Aunt   ?  Heart disease Paternal Aunt   ? Heart disease Paternal Uncle   ? Cervical cancer Maternal Grandmother   ? Osteoporosis Maternal Grandmother   ?     Died at 24  ? Osteoarthritis Maternal Grandmother   ? Colon cancer Maternal Grandfather   ? Asthma Maternal Grandfather   ? Asperger's syndrome Maternal Grandfather   ? Emphysema Maternal Grandfather   ?     Died at 11  ? Prostate cancer Maternal Grandfather   ? Heart attack Paternal Grandfather   ?     Died at 77  ? Asthma Paternal Grandfather   ? Tuberculosis Paternal Grandfather   ? Cancer Cousin   ? Asperger's syndrome Cousin   ? Asperger's syndrome Other   ? Heart Problems Paternal Grandmother   ?     Died at 30  ? Ehlers-Danlos syndrome Father   ? Ehlers-Danlos syndrome Brother   ? ?Social History:  reports that she has never smoked. She has never used smokeless tobacco. She reports that she does not drink alcohol and does not use drugs. ? ?Allergies:  ?Allergies  ?Allergen Reactions  ? Augmentin  [Amoxicillin-Pot Clavulanate] Diarrhea  ? Chocolate Flavor   ?  Other reaction(s): Other (See Comments) ?Wheezing, acne  ? Demerol [Meperidine] Other (See Comments)  ?  Slow  to wake up when this drug is given.   ? Keflex [Cephalexin] Nausea And Vomiting  ? Morphine And Related Nausea And Vomiting  ? Tape Dermatitis  ?  Must use paper tape only  ? Toradol [Ketorolac Tromethamine] Nausea And Vomiting  ? Amoxicillin   ?  Other reaction(s): Unknown  ? Chocolate   ?  GI distress  ? Nsaids   ?  patient develops ulcers ?Other reaction(s): Other (See Comments) ?patient develops ulcers  ? Strawberry Extract   ?  GI distress  ? ? ?(Not in a hospital admission) ? ? ?Results for orders placed or performed during the hospital encounter of 10/04/21 (from the past 48 hour(s))  ?Phosphorus     Status: None  ? Collection Time: 10/17/21  5:59 AM  ?Result Value Ref Range  ? Phosphorus 3.7 2.5 - 4.6 mg/dL  ?  Comment: Performed at [facility] [address], [place], [place]  [address]  ?Basic metabolic panel     Status: Abnormal  ? Collection Time: 10/17/21  5:59 AM  ?Result Value Ref Range  ? Sodium 142 135 - 145 mmol/L  ? Potassium 3.9 3.5 - 5.1 mmol/L  ? Chloride 107 98 - 111 mmol/L  ? CO2 24 22 - 32 mmol/L  ? Glucose, Bld 118 (H) 70 - 99 mg/dL  ?  Comment: Glucose reference range applies only to samples taken after fasting for at least 8 hours.  ? BUN 13 6 - 20 mg/dL  ? Creatinine, Ser 0.59 0.44 - 1.00 mg/dL  ? Calcium 8.9 8.9 - 10.3 mg/dL  ? GFR, Estimated >60 >60 mL/min  ?  Comment: (NOTE) ?Calculated using the CKD-EPI Creatinine Equation (2021) ?  ? Anion gap 11 5 - 15  ?  Comment: Performed at [facility], [address], [place], [place] [address]  ?Magnesium     Status: None  ? Collection Time: 10/17/21  5:59 AM  ?Result Value Ref Range  ? Magnesium 2.1 1.7 - 2.4 mg/dL  ?  Comment: Performed at [facility], [address], [place], [place] [address]  ?Basic metabolic panel     Status: Abnormal  ? Collection Time: 10/18/21  6:06 AM  ?Result Value Ref Range  ? Sodium 139 135 - 145 mmol/L  ? Potassium 3.7 3.5 - 5.1 mmol/L  ? Chloride 107 98 - 111 mmol/L  ? CO2 24 22 - 32 mmol/L  ? Glucose, Bld 109 (H) 70 - 99 mg/dL  ?  Comment: Glucose reference range applies only to samples taken after fasting for at least 8 hours.  ? BUN 14 6 - 20 mg/dL  ? Creatinine, Ser 0.67 0.44 - 1.00 mg/dL  ? Calcium 8.6 (L) 8.9 - 10.3 mg/dL  ? GFR, Estimated >60 >60 mL/min  ?  Comment: (NOTE) ?Calculated using the CKD-EPI Creatinine Equation (2021) ?  ? Anion gap 8 5 - 15  ?  Comment: Performed at [facility], [address], [place], [place] [address]  ?Magnesium     Status: None  ? Collection Time: 10/18/21  6:06 AM  ?Result Value Ref Range  ? Magnesium 2.1 1.7 - 2.4 mg/dL  ?  Comment: Performed at [facility], [address], [name], [place] [address]  ?Phosphorus     Status: None  ? Collection Time: 10/18/21  6:06 AM  ?Result Value Ref Range  ? Phosphorus 4.1  2.5 - 4.6 mg/dL  ?  Comment: Performed at [facility],  [place], [place] [address]  ? ?No results found. ? ?Review of Systems  ?Unable to perform ROS: Psychiatric disorder  ?Skin: Negative.   ?Neurological: Negative.   ? ?Blood pressure 108/90, pulse 89, temperature (!) 97 ?F (36.1 ?C), resp. rate (!) 26, height '5\' 3"'$  (1.6 m), weight 60.9 kg, SpO2 97 %. ?Physical Exam ?Vitals and nursing note reviewed.  ?Constitutional:   ?   Appearance: She is well-developed.  ?HENT:  ?   Head: Normocephalic and atraumatic.  ?Eyes:  ?   Conjunctiva/sclera: Conjunctivae normal.  ?   Pupils: Pupils are equal, round, and reactive to light.  ?Cardiovascular:  ?   Heart sounds: Normal heart sounds.  ?Pulmonary:  ?   Effort: Pulmonary effort is normal.  ?Abdominal:  ?   Palpations: Abdomen is soft.  ?Musculoskeletal:     ?   General: Normal range of motion.  ?   Cervical back: Normal range of motion.  ?Skin: ?   General: Skin is warm and dry.  ?Psychiatric:     ?   Attention and Perception: She is inattentive.     ?   Mood and Affect: Affect is blunt.     ?   Speech: She is noncommunicative.  ?  ? ?Assessment/Plan ?Continue with index course of ECT which is being tolerated well and which appears to be effective ? ?Alethia Berthold, MD ?10/18/2021, 5:00 PM ? ? ? ?

## 2021-10-19 ENCOUNTER — Other Ambulatory Visit: Payer: Self-pay | Admitting: Psychiatry

## 2021-10-19 ENCOUNTER — Ambulatory Visit (INDEPENDENT_AMBULATORY_CARE_PROVIDER_SITE_OTHER): Payer: BC Managed Care – PPO | Admitting: *Deleted

## 2021-10-19 DIAGNOSIS — F22 Delusional disorders: Secondary | ICD-10-CM

## 2021-10-19 DIAGNOSIS — F061 Catatonic disorder due to known physiological condition: Secondary | ICD-10-CM | POA: Diagnosis not present

## 2021-10-19 DIAGNOSIS — R4182 Altered mental status, unspecified: Secondary | ICD-10-CM | POA: Diagnosis not present

## 2021-10-19 DIAGNOSIS — F418 Other specified anxiety disorders: Secondary | ICD-10-CM

## 2021-10-19 DIAGNOSIS — F333 Major depressive disorder, recurrent, severe with psychotic symptoms: Secondary | ICD-10-CM | POA: Diagnosis not present

## 2021-10-19 DIAGNOSIS — G40909 Epilepsy, unspecified, not intractable, without status epilepticus: Secondary | ICD-10-CM

## 2021-10-19 MED ORDER — ESCITALOPRAM OXALATE 10 MG PO TABS
10.0000 mg | ORAL_TABLET | Freq: Every day | ORAL | Status: DC
  Administered 2021-10-20 – 2021-10-27 (×8): 10 mg via ORAL
  Filled 2021-10-19 (×9): qty 1

## 2021-10-19 MED ORDER — BUPROPION HCL ER (XL) 150 MG PO TB24: 150.0000 mg | ORAL_TABLET | Freq: Every day | ORAL | Status: DC

## 2021-10-19 NOTE — Consult Note (Signed)
[facility] Consult   Reason for Consult: Consult follow-up with this 46 year old woman recovering from catatonia Referring Physician: Ouida Sills Patient Identification: Karen [name] MRN:  [id number] Principal Diagnosis: Catatonia Diagnosis:  Principal Problem:   Catatonia Active Problems:   Depression with anxiety   Seizure disorder ([place])   Sepsis secondary to UTI ([place])   AMS (altered mental status)   Thyroid nodule   Hypokalemia   Elevated troponin   Depression, major, recurrent, severe with psychosis ([place])   Malnutrition of moderate degree   Encounter for nasogastric (NG) tube placement   Urinary tract infection without hematuria   Total Time spent with patient: 30 minutes  Subjective:   Karen [name] is a 46 y.o. female patient admitted with "I am confused".  HPI: Patient seen and chart reviewed.  46 year old woman who is recovering from catatonia.  Has had several ECT treatments at this point.  Tolerating them well and showing transient recovery.  Today she was awake in bed but looked very fatigued.  Opened her eyes and spoke with me but did not move much.  She told me she felt confused.  I explained her whole situation to her briefly including diagnosis and how long she had been in the hospital and the current treatment.  Patient paid attention to all this and that and said it was helpful although I am not entirely sure how much she understood.  She said she is eating some and has been trying to cooperate with the physical therapy.  Past Psychiatric History: Past history of anxiety history of neurologic problems some depression.  Risk to Self:   Risk to Others:   Prior Inpatient Therapy:   Prior Outpatient Therapy:    Past Medical History:  Past Medical History:  Diagnosis Date   Ehlers-Danlos syndrome    Fibromyalgia    Headache    Sleep apnea    Tics of organic origin    Transient alteration of awareness     Past Surgical History:   Procedure Laterality Date   COLONOSCOPY WITH PROPOFOL N/A 02/13/2018   Procedure: COLONOSCOPY WITH PROPOFOL;  Surgeon: [name], MD;  Location: [facility] ENDOSCOPY;  Service: Endoscopy;  Laterality: N/A;   DILATION AND CURETTAGE OF UTERUS  2009   ESOPHAGOGASTRODUODENOSCOPY (EGD) WITH PROPOFOL  02/13/2018   Procedure: ESOPHAGOGASTRODUODENOSCOPY (EGD) WITH PROPOFOL;  Surgeon: [name], MD;  Location: [facility] ENDOSCOPY;  Service: Endoscopy;;   EYE SURGERY Left 1990   3 Surgeries on left eye to correct crossed eye   LAPAROSCOPY Left 2003   Fallopian Tube   [place]   3 Surgeries to repair broken arm   OTHER SURGICAL HISTORY Left    3 surgeries on her left thigh as an infant   OTHER SURGICAL HISTORY  1998   Jaw surgery   Right arm surgery  1987   x 3 due to fracture   TOENAIL EXCISION Bilateral 06/13/2019   Procedure: BILATERAL SECOND TOE PARTIAL NAIL ABLATION;  Surgeon: Erle Crocker, MD;  Location: [place];  Service: Orthopedics;  Laterality: Bilateral;  SURGERY REQUEST TIME 1 HOUR   TONSILLECTOMY  12/13/2002   Family History:  Family History  Problem Relation Age of Onset   Depression Mother    Stroke Mother    Fibromyalgia Mother    Osteoarthritis Mother    Asthma Brother    Depression Brother    Migraines Brother    Asperger's syndrome Brother  Other Brother        Ehlers-Danlos Syndrome   Cancer Maternal Aunt    Asperger's syndrome Maternal Aunt    Breast cancer Maternal Aunt    Heart disease Paternal Aunt    Heart disease Paternal Uncle    Cervical cancer Maternal Grandmother    Osteoporosis Maternal Grandmother        Died at 18   Osteoarthritis Maternal Grandmother    Colon cancer Maternal Grandfather    Asthma Maternal Grandfather    Asperger's syndrome Maternal Grandfather    Emphysema Maternal Grandfather        Died at 3   Prostate cancer Maternal Grandfather    Heart attack Paternal  Grandfather        Died at 29   Asthma Paternal Grandfather    Tuberculosis Paternal Grandfather    Cancer Cousin    Asperger's syndrome Cousin    Asperger's syndrome Other    Heart Problems Paternal Grandmother        Died at 55   Ehlers-Danlos syndrome Father    Ehlers-Danlos syndrome Brother    Family Psychiatric  History: See previous Social History:  Social History   Substance and Sexual Activity  Alcohol Use No   Alcohol/week: 0.0 standard drinks     Social History   Substance and Sexual Activity  Drug Use No    Social History   Socioeconomic History   Marital status: Single    Spouse name: Not on file   Number of children: Not on file   Years of education: Not on file   Highest education level: Not on file  Occupational History   Not on file  Tobacco Use   Smoking status: Never   Smokeless tobacco: Never  Vaping Use   Vaping Use: Never used  Substance and Sexual Activity   Alcohol use: No    Alcohol/week: 0.0 standard drinks   Drug use: No   Sexual activity: Never  Other Topics Concern   Not on file  Social History Narrative   Tiajah is 77.   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    She lives with her mother.    She enjoys reading, watching TV, writing, and painting.    Social Determinants of Health   Financial Resource Strain: Not on file  Food Insecurity: Not on file  Transportation Needs: Not on file  Physical Activity: Not on file  Stress: Not on file  Social Connections: Not on file   Additional Social History:    Allergies:   Allergies  Allergen Reactions   Augmentin  [Amoxicillin-Pot Clavulanate] Diarrhea   Chocolate Flavor     Other reaction(s): Other (See Comments) Wheezing, acne   Demerol [Meperidine] Other (See Comments)    Slow to wake up when this drug is given.    Keflex [Cephalexin] Nausea And Vomiting   Morphine And Related Nausea And Vomiting   Tape Dermatitis    Must use paper tape only   Toradol [Ketorolac  Tromethamine] Nausea And Vomiting   Amoxicillin     Other reaction(s): Unknown   Chocolate     GI distress   Nsaids     patient develops ulcers Other reaction(s): Other (See Comments) patient develops ulcers   Strawberry Extract     GI distress    Labs:  Results for orders placed or performed during the hospital encounter of 10/04/21 (from the past 48 hour(s))  Basic metabolic panel     Status: Abnormal  Collection Time: 10/18/21  6:06 AM  Result Value Ref Range   Sodium 139 135 - 145 mmol/L   Potassium 3.7 3.5 - 5.1 mmol/L   Chloride 107 98 - 111 mmol/L   CO2 24 22 - 32 mmol/L   Glucose, Bld 109 (H) 70 - 99 mg/dL    Comment: Glucose reference range applies only to samples taken after fasting for at least 8 hours.   BUN 14 6 - 20 mg/dL   Creatinine, Ser 0.67 0.44 - 1.00 mg/dL   Calcium 8.6 (L) 8.9 - 10.3 mg/dL   GFR, Estimated >60 >60 mL/min    Comment: (NOTE) Calculated using the CKD-EPI Creatinine Equation (2021)    Anion gap 8 5 - 15    Comment: Performed at [facility], [address], [place], [place] [address]  Magnesium     Status: None   Collection Time: 10/18/21  6:06 AM  Result Value Ref Range   Magnesium 2.1 1.7 - 2.4 mg/dL    Comment: Performed at [facility], [address], [place], [place] [address]  Phosphorus     Status: None   Collection Time: 10/18/21  6:06 AM  Result Value Ref Range   Phosphorus 4.1 2.5 - 4.6 mg/dL    Comment: Performed at [facility], [address], [name], [place] [address]    Current Facility-Administered Medications  Medication Dose Route Frequency Provider Last Rate Last Admin   0.9 %  sodium chloride infusion   Intravenous PRN Fritzi Mandes, MD 0 mL/hr at 10/15/21 0947 Restarted at 10/18/21 1346   acetaminophen (TYLENOL) tablet 650 mg  650 mg Oral Q6H PRN Para Skeans, MD   650 mg at 10/18/21 2151   Or   acetaminophen (TYLENOL) suppository 650 mg  650 mg Rectal Q6H PRN Para Skeans, MD        albuterol (PROVENTIL) (2.5 MG/3ML) 0.083% nebulizer solution 2.5 mg  2.5 mg Nebulization Q6H PRN Renda Rolls, RPH       baclofen (LIORESAL) tablet 5 mg  5 mg Oral BID PRN Richarda Osmond, MD   5 mg at 10/15/21 1811   enoxaparin (LOVENOX) injection 40 mg  40 mg Subcutaneous Q24H Fritzi Mandes, MD   40 mg at 10/18/21 2152   feeding supplement (BOOST / RESOURCE BREEZE) liquid 1 Container  1 Container Oral TID BM Richarda Osmond, MD   1 Container at 10/19/21 1353   haloperidol lactate (HALDOL) injection 2 mg  2 mg Intravenous Q6H PRN Kaelen Brennan T, MD       ibuprofen (ADVIL) tablet 200 mg  200 mg Oral Q6H PRN Richarda Osmond, MD   200 mg at 10/15/21 1811   multivitamin with minerals tablet 1 tablet  1 tablet Oral Daily Richarda Osmond, MD       OLANZapine zydis (ZYPREXA) disintegrating tablet 15 mg  15 mg Oral QHS Loni Delbridge T, MD   15 mg at 10/18/21 2152   sodium chloride flush (NS) 0.9 % injection 3 mL  3 mL Intravenous Q12H Para Skeans, MD   3 mL at 10/19/21 4944    Musculoskeletal: Strength & Muscle Tone: decreased Gait & Station: unsteady Patient leans: N/A            Psychiatric Specialty Exam:  Presentation  General Appearance: Disheveled  Eye Contact:Fair  Speech:Normal Rate; Clear and Coherent  Speech Volume:Normal  Handedness:Right   Mood and Affect  Mood:Anxious  Affect:Congruent  Thought Process  Thought Processes:Disorganized  Descriptions of Associations:Tangential  Orientation:Full (Time, Place and Person)  Thought Content:Tangential  History of Schizophrenia/Schizoaffective disorder:No  Duration of Psychotic Symptoms:Greater than six months (Since August 2022)  Hallucinations:No data recorded Ideas of Reference:Paranoia  Suicidal Thoughts:No data recorded Homicidal Thoughts:No data recorded  Sensorium  Memory:Immediate [place]   Executive Functions   Concentration:Fair  Attention Span:Fair  [place]  Language:Good   Psychomotor Activity  Psychomotor Activity:No data recorded  Assets  Assets:Communication Skills; Leisure Time; Social Support; Resilience   Sleep  Sleep:No data recorded  Physical Exam: Physical Exam Vitals and nursing note reviewed.  Constitutional:      Appearance: Normal appearance. She is ill-appearing.  HENT:     Head: Normocephalic and atraumatic.     Mouth/Throat:     Pharynx: Oropharynx is clear.  Eyes:     Pupils: Pupils are equal, round, and reactive to light.  Cardiovascular:     Rate and Rhythm: Normal rate and regular rhythm.  Pulmonary:     Effort: Pulmonary effort is normal.     Breath sounds: Normal breath sounds.  Abdominal:     General: Abdomen is flat.     Palpations: Abdomen is soft.  Musculoskeletal:        General: Normal range of motion.  Skin:    General: Skin is warm and dry.  Neurological:     General: No focal deficit present.     Mental Status: She is alert. Mental status is at baseline.  Psychiatric:        Attention and Perception: Attention normal.        Mood and Affect: Mood normal. Affect is blunt.        Speech: Speech is delayed.        Behavior: Behavior is slowed.        Thought Content: Thought content normal.        Cognition and Memory: Memory is impaired.   Review of Systems  Constitutional:  Positive for malaise/fatigue.  HENT: Negative.    Eyes: Negative.   Respiratory: Negative.    Cardiovascular: Negative.   Gastrointestinal: Negative.   Musculoskeletal: Negative.   Skin: Negative.   Neurological: Negative.   Blood pressure (!) 90/55, pulse 98, temperature 98.3 F (36.8 C), temperature source Oral, resp. rate 15, height '5\' 3"'$  (1.6 m), weight 60.9 kg, SpO2 99 %. Body mass index is 23.78 kg/m.  Treatment Plan Summary: Medication management and Plan patient seems to be recovering but more slowly than I would  have hoped.  I am going to adjust medicine.  Continue the olanzapine at night but add an antidepressant with Lexapro 10 mg in the day.  Next ECT treatment scheduled for tomorrow.  We are still only part way through the expected course and I will am hoping for further recovery from ECT.  Patient given psychoeducation and strongly encouraged to work hard on recovery.  Disposition:  See above  Alethia Berthold, MD 10/19/2021 4:46 PM

## 2021-10-19 NOTE — Chronic Care Management (AMB) (Signed)
Chronic Care Management    Clinical Social Work Note  10/19/2021 Name: Karen [name] MRN: [id number] DOB: Aug 15, 1976  ALLAHNA HUSBAND is a 46 y.o. year old female who is a primary care patient of Fisher, Kirstie Peri, MD. The CCM team was consulted to assist the patient with chronic disease management and/or care coordination needs related to: Mental Health Counseling and Resources.   Collaboration with patient's mother  for follow up visit in response to provider referral for social work chronic care management and care coordination services.   Consent to Services:  The patient was given information about Chronic Care Management services, agreed to services, and gave verbal consent prior to initiation of services.  Please see initial visit note for detailed documentation.   Patient agreed to services and consent obtained.   Assessment: Review of patient past medical history, allergies, medications, and health status, including review of relevant consultants reports was performed today as part of a comprehensive evaluation and provision of chronic care management and care coordination services.     SDOH (Social Determinants of Health) assessments and interventions performed:    Advanced Directives Status: Not addressed in this encounter.  CCM Care Plan  Allergies  Allergen Reactions   Augmentin  [Amoxicillin-Pot Clavulanate] Diarrhea   Chocolate Flavor     Other reaction(s): Other (See Comments) Wheezing, acne   Demerol [Meperidine] Other (See Comments)    Slow to wake up when this drug is given.    Keflex [Cephalexin] Nausea And Vomiting   Morphine And Related Nausea And Vomiting   Tape Dermatitis    Must use paper tape only   Toradol [Ketorolac Tromethamine] Nausea And Vomiting   Amoxicillin     Other reaction(s): Unknown   Chocolate     GI distress   Nsaids     patient develops ulcers Other reaction(s): Other (See Comments) patient develops ulcers   Strawberry Extract      GI distress    Facility-Administered Encounter Medications as of 10/19/2021  Medication   0.9 %  sodium chloride infusion   acetaminophen (TYLENOL) tablet 650 mg   Or   acetaminophen (TYLENOL) suppository 650 mg   albuterol (PROVENTIL) (2.5 MG/3ML) 0.083% nebulizer solution 2.5 mg   baclofen (LIORESAL) tablet 5 mg   dextrose 5 % and 0.45 % NaCl with KCl 20 mEq/L infusion   enoxaparin (LOVENOX) injection 40 mg   feeding supplement (BOOST / RESOURCE BREEZE) liquid 1 Container   haloperidol lactate (HALDOL) injection 2 mg   ibuprofen (ADVIL) tablet 200 mg   multivitamin with minerals tablet 1 tablet   OLANZapine zydis (ZYPREXA) disintegrating tablet 15 mg   sodium chloride flush (NS) 0.9 % injection 3 mL   Outpatient Encounter Medications as of 10/19/2021  Medication Sig Note   albuterol (VENTOLIN HFA) 108 (90 Base) MCG/ACT inhaler Inhale 2 puffs into the lungs every 6 (six) hours as needed. (Patient not taking: Reported on 10/01/2021)    azelastine (OPTIVAR) 0.05 % ophthalmic solution Apply to eye as directed. 1 drop in each eye prn (Patient not taking: Reported on 10/01/2021)    busPIRone (BUSPAR) 10 MG tablet Take 1 tablet (10 mg total) by mouth 2 (two) times daily. Take along with '15mg'$  tablet twice a day (Patient not taking: Reported on 10/01/2021) 04/22/2021: Take along with one 15 mg tablet for total 25 mg twice daily   busPIRone (BUSPAR) 15 MG tablet TAKE 1 TABLET BY MOUTH TWICE A DAY (Patient not taking: Reported on 10/01/2021)  celecoxib (CELEBREX) 200 MG capsule TAKE 1 CAPSULE BY MOUTH TWICE A DAY (Patient not taking: Reported on 10/01/2021)    cyclobenzaprine (FLEXERIL) 5 MG tablet Take 1 tablet (5 mg total) by mouth 3 (three) times daily as needed for muscle spasms. Do not take within 6 hours of taking tizandine (Patient not taking: Reported on 10/01/2021)    diclofenac Sodium (VOLTAREN) 1 % GEL APPLY AS NEEDED 3 TIMES A DAY (Patient not taking: Reported on 10/01/2021)    DULoxetine  (CYMBALTA) 60 MG capsule TAKE 2 CAPSULES BY MOUTH EVERY DAY (Patient not taking: Reported on 10/01/2021)    fentaNYL (DURAGESIC) 100 MCG/HR Place 1 patch onto the skin every 3 (three) days. (Patient not taking: Reported on 10/01/2021)    fexofenadine (ALLEGRA) 180 MG tablet Take 180 mg by mouth daily. (Patient not taking: Reported on 10/01/2021)    fluticasone (FLONASE) 50 MCG/ACT nasal spray  (Patient not taking: Reported on 10/01/2021)    fluticasone (FLOVENT HFA) 110 MCG/ACT inhaler Inhale 2 puffs into the lungs 2 (two) times daily. (Patient not taking: Reported on 10/01/2021)    Fluticasone-Salmeterol (ADVAIR) 250-50 MCG/DOSE AEPB Inhale 1 puff into the lungs every 12 (twelve) hours. Rinse mouth after use (Patient not taking: Reported on 10/01/2021)    gabapentin (NEURONTIN) 600 MG tablet TAKE 2 TABLETS IN THE MORNING AND 3 TABLETS AT BEDTIME (Patient not taking: Reported on 10/01/2021)    haloperidol (HALDOL) 0.5 MG tablet Take 0.5 tablets (0.25 mg total) by mouth 2 (two) times daily. (Patient not taking: Reported on 10/01/2021)    haloperidol (HALDOL) 1 MG tablet Take 1 mg by mouth 2 (two) times daily. Patient states she should be and has been on 5 mg twice daily. (Patient not taking: Reported on 10/01/2021)    naloxone [facility]) nasal spray 4 mg/0.1 mL Place 1 spray into the nose as needed. (Patient not taking: Reported on 10/01/2021)    omeprazole (PRILOSEC) 40 MG capsule Take 1 capsule (40 mg total) by mouth 2 (two) times daily. (Patient not taking: Reported on 10/01/2021)    ondansetron (ZOFRAN-ODT) 4 MG disintegrating tablet DISSOLVE 1 TABLET IN MOUTH EVERY 8 HOURS FOR NAUSEA AND VOMITING (Patient not taking: Reported on 10/01/2021)    pregabalin (LYRICA) 300 MG capsule Take 1 capsule (300 mg total) by mouth 2 (two) times daily. (Patient not taking: Reported on 10/01/2021)    QUEtiapine (SEROQUEL) 200 MG tablet TAKE 1 TABLET BY MOUTH AT BEDTIME. (Patient not taking: Reported on 10/01/2021)    Sod  Picosulfate-Mag Ox-Cit Acd (CLENPIQ) 10-3.5-12 MG-GM -GM/160ML SOLN Take 320 mLs by mouth as directed. (Patient not taking: Reported on 10/01/2021)    tamsulosin (FLOMAX) 0.4 MG CAPS capsule Take 1 capsule (0.4 mg total) by mouth daily. (Patient not taking: Reported on 10/01/2021)    tiZANidine (ZANAFLEX) 4 MG tablet TAKE 0.5-1 TABLETS (2-4 MG TOTAL) BY MOUTH EVERY 8 (EIGHT) HOURS AS NEEDED FOR MUSCLE SPASMS. (Patient not taking: Reported on 10/01/2021)     Patient Active Problem List   Diagnosis Date Noted   Encounter for nasogastric (NG) tube placement    Urinary tract infection without hematuria    Malnutrition of moderate degree 10/07/2021   AMS (altered mental status) 10/05/2021   Thyroid nodule 10/05/2021   Hypokalemia 10/05/2021   Elevated troponin 10/05/2021   Catatonia 10/05/2021   Depression, major, recurrent, severe with psychosis ([place]) 10/05/2021   Polypharmacy 08/12/2021   Major depressive disorder, recurrent episode, moderate ([place]) 76/19/5093   Acute metabolic encephalopathy 26/71/2458  Asthma 04/22/2021   Paranoia (psychosis) ([place]) 04/22/2021   Psychosis ([place]) 04/22/2021   Sepsis secondary to UTI ([place]) 04/22/2021   Urinary retention 04/22/2021   Chronic tension type headache 06/04/2020   Breast lump on left side at 1 o'clock position 10/09/2019   Fibrocystic breast changes, bilateral 10/09/2019   Family history of colon cancer    Rectal polyp    Heartburn    Excessive somnolence disorder 06/28/2017   Difficulty hearing 08/31/2015   Back pain, chronic 08/31/2015   Rheumatoid arthritis ([place]) 08/31/2015   Alopecia 03/10/2015   Arthritis 03/10/2015   Allergic asthma, mild persistent, uncomplicated 40/98/1191   Chronic nausea 03/10/2015   DDD (degenerative disc disease), lumbar 03/10/2015   Depression with anxiety 03/10/2015   Personal history of MRSA (methicillin resistant Staphylococcus aureus) 03/10/2015   Insomnia 03/10/2015   Mitral valve prolapse 03/10/2015    Paresthesias/numbness 03/10/2015   Allergic rhinitis, seasonal 03/10/2015   Seizure disorder ([place]) 03/10/2015   Mixed sleep apnea, Central predominant 03/10/2015   Migraine without aura 01/27/2014   Fibromyalgia 01/27/2014   Transient alteration of awareness 01/07/2014   Tics of organic origin 01/07/2014   Backache, unspecified 01/07/2014   Ehlers-Danlos syndrome 01/07/2014    Conditions to be addressed/monitored:  Acute metabolic encephalopathy   - Seizure disorder (HCC)  - Psychosis, unspecified psychosis type ([place])   Limited social support  Care Plan : General Social Work (Adult)  Updates made by [employer], Darla Lesches, LCSW since 10/19/2021 12:00 AM     Problem: CHL AMB "PATIENT-SPECIFIC PROBLEM"   Note:   CARE PLAN ENTRY (see longitudinal plan of care for additional care plan information)  Current Barriers:  Patient with Acute metabolic encephalopathy, Seizure disorder ([place]) and Psychosis, unspecified psychosis type ([place]) in need of assistance with connection to community resources -psychosis continues per patient's mother, patient now not speaking verbally Knowledge deficits and need for support, education and care coordination related to community resource support  Limited social support, Mental Health Concerns , and Lacks knowledge of community resource: related to patient's supervision needs- Patient's mother is unable to leave patient alone without supervision  Clinical Goal(s)  Over the next 90 days, patient will work with care management team member to address concerns related to patient's supervision needs in the home   Interventions provided by LCSW:  Continued to assessed patient's care coordination needs related to supervision needs in the absence of her parent and discussed ongoing care management follow up  Patient's mother confirmed that patient is now hospitalized and receiving ECT Patient's mother confirms that patient's behavior and mood is slowly improving,  patient now oriented, appetite has improved, however affects of treatment tends to not last -patient's mother hopeful that over time effects of treatment will last longer Per patient's mother, the plan is for patient to enter into rehab following her hospitalization Active listening / Reflection utilized  Emotional Support and encouragement provided Caregiver Stress acknowledged Encouraged self care  Patient Self Care Activities & Deficits:  Patient is unable to independently navigate community resource options without care coordination support  Acknowledges deficits and is motivated to resolve concern  Lacks social connections Performs ADL's independently  Please see past updates related to this goal by clicking on the "Past Updates" button in the selected goal         Follow Up Plan: SW will follow up with patient by phone over the next 30 business days       [employer], [place] Social Worker  [facility] [phone]

## 2021-10-19 NOTE — Progress Notes (Signed)
PHARMACY CONSULT NOTE - FOLLOW UP ? ?Pharmacy Consult for Electrolyte Monitoring and Replacement  ? ?Recent Labs: ?Potassium (mmol/L)  ?Date Value  ?10/18/2021 3.7  ?02/19/2014 3.9  ? ?Magnesium (mg/dL)  ?Date Value  ?10/18/2021 2.1  ? ?Calcium (mg/dL)  ?Date Value  ?10/18/2021 8.6 (L)  ? ?Calcium, Total (mg/dL)  ?Date Value  ?02/19/2014 9.0  ? ?Albumin (g/dL)  ?Date Value  ?10/04/2021 3.6  ?10/15/2019 4.5  ?02/19/2014 3.6  ? ?Phosphorus (mg/dL)  ?Date Value  ?10/18/2021 4.1  ? ?Sodium (mmol/L)  ?Date Value  ?10/18/2021 139  ?10/15/2019 140  ?02/19/2014 140  ? ? ? ?Assessment: ?Pharmacy has been consulted to monitor and replace electrolytes in 45yo patient admitted with sepsis secondary to UTI. She is currently in a catatonic state and per psychology, will start patient on ECT. Patient with significant weight loss of more than 30 pounds. Dietician also following. ? ? Mighty Shake TID with meals plus calorie counting. Per MD notes, patient responding to treatment with stable BG and eating more. Today NPO for ECT, then plan to resume normal diet with aspiration precautions. ? ?Goal of Therapy:  ?Electrolytes WNL ? ?Plan:  ?Mg, K, Na, and phos all within normal limits. Corrected calcium at 8.9 will not  need replacement either. ?Pharmacy will continue to follow and replace electrolytes as needed. ? ?Darrick Penna, PharmD, MS PGPM ?Clinical Pharmacist ?10/19/2021 ?8:32 AM ? ?

## 2021-10-19 NOTE — Progress Notes (Signed)
Occupational Therapy Treatment ?Patient Details ?Name: Karen [name] ?MRN: [id number] ?DOB: 20-Jan-1976 ?Today's Date: 10/19/2021 ? ? ?History of present illness Pt admitted to [facility] on 10[facility]04[facility]21 after she was found down on the floor for several days, covered in urine and feces. Pt found to have UTI and met sepsis criteria with, lactic acidosis. Catatonic state being addressed via ECT. PMH significant for: depression, anxiety, seizure, thyroid nodule, hypokalemia, malnutrition, paranoid psychosis with hallucinations. ?  ?OT comments ? Upon entering the room, pt supine in bed and sleeping soundly. Pt does not arouse to voice. OT providing max- total A to get pt to EOB and then eyes full open and pt is awake for participation. Pt's sitting balance with close supervision and given washcloth to wash face. Pt initiates tasks with increased time to initiate. Pt does have limited verbal responses but does answer every question when asked. Pt stands with min HHA and taking several steps to the R towards HOB. Pt then laying self back down in bed without assistance. All needs within reach and bed alarm activated.   ? ?Recommendations for follow up therapy are one component of a multi-disciplinary discharge planning process, led by the attending physician.  Recommendations may be updated based on patient status, additional functional criteria and insurance authorization. ?   ?Follow Up Recommendations ? Skilled nursing-short term rehab (<3 hours/day)  ?  ?Assistance Recommended at Discharge Frequent or constant Supervision/Assistance  ?Patient can return home with the following ? A lot of help with walking and/or transfers;A lot of help with bathing/dressing/bathroom;Help with stairs or ramp for entrance;Assist for transportation;Direct supervision/assist for financial management;Direct supervision/assist for medications management ?  ?Equipment Recommendations ? Other (comment) (defer to next venue of care)  ?  ?   ?Precautions /  Restrictions Precautions ?Precautions: Fall ?Precaution Comments: Aspiration; seizure ?Restrictions ?Weight Bearing Restrictions: No  ? ? ?  ? ?Mobility Bed Mobility ?Overal bed mobility: Needs Assistance ?Bed Mobility: Supine to Sit ?  ?  ?Supine to sit: Max assist ?Sit to supine: Supervision ?  ?General bed mobility comments: Pt very lethargic and needing max A to come to EOB in order to alert her further ?  ? ?Transfers ?Overall transfer level: Needs assistance ?Equipment used: 1 person hand held assist ?Transfers: Sit to/from Stand ?Sit to Stand: Min assist ?  ?  ?  ?  ?  ?  ?  ?  ?Balance Overall balance assessment: Needs assistance ?Sitting-balance support: Single extremity supported, Feet unsupported ?Sitting balance-Leahy Scale: Fair ?  ?  ?Standing balance support: Bilateral upper extremity supported ?Standing balance-Leahy Scale: Poor ?Standing balance comment: min assist to steady in standing ?  ?  ?  ?  ?  ?  ?  ?  ?  ?  ?  ?   ? ?ADL either performed or assessed with clinical judgement  ? ?ADL Overall ADL's : Needs assistance/impaired ?  ?  ?Grooming: Wash/dry hands;Wash/dry face;Sitting;Supervision/safety;Set up ?Grooming Details (indicate cue type and reason): increased time and cuing for initiation ?  ?  ?  ?  ?  ?  ?  ?  ?  ?  ?  ?  ?  ?  ?  ?  ?  ? ?Extremity/Trunk Assessment Upper Extremity Assessment ?Upper Extremity Assessment: Generalized weakness ?  ?Lower Extremity Assessment ?Lower Extremity Assessment: Generalized weakness ?  ?  ?  ? ?Vision Patient Visual Report: No change from baseline ?  ?  ?   ?   ? ?  Cognition Arousal/Alertness: Lethargic, Awake/alert ?Behavior During Therapy: Flat affect ?  ?  ?  ?  ?  ?  ?  ?  ?  ?  ?  ?  ?  ?  ?  ?  ?  ?General Comments: Pt follows commands with increased time. ?  ?  ?   ?   ?   ?   ? ? ?Pertinent Vitals/ Pain       Pain Assessment ?Pain Assessment: No/denies pain ? ?   ?   ? ?Frequency ? Min 2X/week  ? ? ? ? ?  ?Progress Toward Goals ? ?OT  Goals(current goals can now be found in the care plan section) ? Progress towards OT goals: Progressing toward goals ? ?Acute Rehab OT Goals ?Patient Stated Goal: to go home ?OT Goal Formulation: With patient ?Time For Goal Achievement: 10/30/21 ?Potential to Achieve Goals: Good  ?Plan Discharge plan remains appropriate;Frequency remains appropriate   ? ?   ?AM-PAC OT "6 Clicks" Daily Activity     ?Outcome Measure ? ? Help from another person eating meals?: A Little ?Help from another person taking care of personal grooming?: A Little ?Help from another person toileting, which includes using toliet, bedpan, or urinal?: A Lot ?Help from another person bathing (including washing, rinsing, drying)?: A Lot ?Help from another person to put on and taking off regular upper body clothing?: A Little ?Help from another person to put on and taking off regular lower body clothing?: A Lot ?6 Click Score: 15 ? ?  ?End of Session   ? ?OT Visit Diagnosis: Muscle weakness (generalized) (M62.81);Other symptoms and signs involving the nervous system (R29.898) ?  ?Activity Tolerance Patient tolerated treatment well;Patient limited by lethargy ?  ?Patient Left in bed;with call bell/phone within reach;with bed alarm set ?  ?Nurse Communication   ?  ? ?   ? ?Time: 1443-1540 ?OT Time Calculation (min): 10 min ? ?Charges: OT General Charges ?$OT Visit: 1 Visit ?OT Treatments ?$Self Care/Home Management : 8-22 mins ? ?Darleen Crocker, MS, OTR/L , CBIS ?ascom [phone]  ?10/19/21, 1:20 PM  ?

## 2021-10-19 NOTE — Progress Notes (Signed)
?PROGRESS NOTE ? ?Karen [name]    DOB: September 05, 1975, 46 y.o.  ?QQP:[id number]  ?  Code Status: Full Code   ?DOA: 10/04/2021   LOS: 14  ? ?Brief hospital course  ?Karen [name] is a 46 y.o. female with a PMH significant for Erler's Danlos syndrome, fibromyalgia, sleep apnea, transient alteration of awareness, tics of organic origin, headaches. ?They presented from home to the ED on 10/04/2021 with found down x several days days.  She was found lying on the floor covered with urine and feces ?In the ED, it was found that they had UTI and met sepsis criteria with, lactic acidosis.  ?They were treated with ceftriaxone and IV fluids.  ?Patient was admitted to medicine service for further workup and management of AMS as outlined in detail below. ? ?10/19/21 -stable ? ?Assessment & Plan  ?Principal Problem: ?  Catatonia ?Active Problems: ?  Depression with anxiety ?  Seizure disorder ([place]) ?  Sepsis secondary to UTI [facility]) ?  AMS (altered mental status) ?  Thyroid nodule ?  Hypokalemia ?  Elevated troponin ?  Depression, major, recurrent, severe with psychosis ([place]) ?  Malnutrition of moderate degree ?  Encounter for nasogastric (NG) tube placement ?  Urinary tract infection without hematuria ? ?History of paranoid psychosis with hallucinations and delusions/catatonia ?Severe major depression and anxiety-patient presented in catatonic state and is showing improvement after ECT. Today, patient is alert and conversant appropriately. Seems that she continues to have longer periods of alertness progressing with more treatments.  ?Spoke with patient's mom today who states that she will not be able to provide the level of care that her daughter requires at this time. If/when she becomes independent, she can live with her mom again but will need discharge to SNF vs inpatient psychiatric placement at discharge pending how she progresses with ECT, medications, therapy. ?-Psychiatry following, appreciate recommendations ? -ECT  2/27- ? -Continue Zyprexa increased to 26m nightly ?- PT/OT ? ?Sepsis-sepsis physiology has resolved.  S/p CTX for UTI ? ?Elevated troponins on admission-likely from demand ischemia.  No cardiac history, unable to assess if she had chest pain, echo unremarkable. Cardiology evaluated ? ?Failure to thrive-poor nutrition status due to poor PO intake in sedated state and did not tolerate NG tube. Blood sugars have remained stable. Patient is NPO prior to ECT treatments. Optimistically she is eating more so will not need enteral feeds. As she is responding to treatment, will need to monitor closely for refeeding syndrome. ?Phosphorus, magnesium normal. Down approximately 40 pounds from her weight prior to her current illness.  ?- strict I/O ?- calorie count ?- nutrition consult ?-Encourage p.o. intake ?- discontinued IV fluids as she has increased PO intake.  ? ?Thyroid nodule- thyroid ultrasound Diffusely heterogeneous and enlarged thyroid gland most consistent with diffuse goitrous change. ? - Recommend follow-up ultrasound in 1 year ? ?Body mass index is 23.78 kg/m?. ? ?VTE ppx: enoxaparin (LOVENOX) injection 40 mg Start: 10/11/21 2200 ?SCDs Start: 10/04/21 2355 ? ?Diet:  ?   ?Diet  ? Diet regular Room service appropriate? Yes; Fluid consistency: Thin  ? ?Subjective 10/19/21   ? ?Pt reports no complaints today. She is doing well overall. Does not open eyes during conversation but answers questions appropriately. ?  ?Objective  ? ?Vitals:  ? 10/18/21 1610 10/18/21 2106 10/18/21 2157 10/19/21 0629  ?BP: 104/80 100/75  100/74  ?Pulse: (!) 102 (!) 109 97 78  ?Resp: '17 16  16  ' ?Temp: 97.6 ?F (36.4 ?  C) (!) 100.4 ?F (38 ?C) 98.9 ?F (37.2 ?C) 98.4 ?F (36.9 ?C)  ?TempSrc: Oral  Oral   ?SpO2: 97% 98%  100%  ?Weight:      ?Height:      ? ? ?Intake/Output Summary (Last 24 hours) at 10/19/2021 0710 ?Last data filed at 10/18/2021 1853 ?Gross per 24 hour  ?Intake 560 ml  ?Output --  ?Net 560 ml  ? ? ?Filed Weights  ? 10/04/21 1900  10/17/21 0500  ?Weight: 60.8 kg 60.9 kg  ?  ? ?Physical Exam:  ?General: awake, NAD ?HEENT: atraumatic, clear conjunctiva, anicteric sclera, MMM ?Respiratory: normal respiratory effort. ?Cardiovascular: normal S1/S2, RRR, no JVD, murmurs, quick capillary refill  ?Nervous: alert and oriented to self and place ?Skin: dry, intact, normal temperature, normal color. No rashes, lesions or ulcers on exposed skin ?Psych: alert, answers questions appropriately. Does not open eyes for conversation today. ? ?Labs   ?I have personally reviewed the following labs and imaging studies ?CBC ?   ?Component Value Date/Time  ? WBC 9.4 10/15/2021 0443  ? RBC 5.00 10/15/2021 0443  ? HGB 13.8 10/15/2021 0443  ? HGB 12.8 10/15/2019 1023  ? HCT 42.2 10/15/2021 0443  ? HCT 37.9 10/15/2019 1023  ? PLT 360 10/15/2021 0443  ? PLT 256 10/15/2019 1023  ? MCV 84.4 10/15/2021 0443  ? MCV 85 10/15/2019 1023  ? MCV 92 02/19/2014 0035  ? MCH 27.6 10/15/2021 0443  ? MCHC 32.7 10/15/2021 0443  ? RDW 17.2 (H) 10/15/2021 0443  ? RDW 13.3 10/15/2019 1023  ? RDW 13.8 02/19/2014 0035  ? LYMPHSABS 1.8 10/04/2021 1901  ? LYMPHSABS 2.7 10/15/2019 1023  ? LYMPHSABS 3.5 02/19/2014 0035  ? MONOABS 0.6 10/04/2021 1901  ? MONOABS 0.5 02/19/2014 0035  ? EOSABS 0.1 10/04/2021 1901  ? EOSABS 0.3 10/15/2019 1023  ? EOSABS 0.9 (H) 02/19/2014 0035  ? BASOSABS 0.0 10/04/2021 1901  ? BASOSABS 0.0 10/15/2019 1023  ? BASOSABS 0.1 02/19/2014 0035  ? ?BMP Latest Ref Rng & Units 10/18/2021 10/17/2021 10/16/2021  ?Glucose 70 - 99 mg/dL 109(H) 118(H) 123(H)  ?BUN 6 - 20 mg/dL '14 13 15  ' ?Creatinine 0.44 - 1.00 mg/dL 0.67 0.59 0.61  ?BUN/Creat Ratio 9 - 23 - - -  ?Sodium 135 - 145 mmol/L 139 142 145  ?Potassium 3.5 - 5.1 mmol/L 3.7 3.9 3.9  ?Chloride 98 - 111 mmol/L 107 107 112(H)  ?CO2 22 - 32 mmol/L '24 24 25  ' ?Calcium 8.9 - 10.3 mg/dL 8.6(L) 8.9 8.5(L)  ? ? ?No results found. ? ?Disposition Plan & Communication  ?Patient status: Inpatient  ?Admitted From: Home ?Planned disposition  location: SNF vs inpatient psych ?Anticipated discharge date: TBD pending continuing ECT therapy, rough estimate would be 3/10 and might need inpatient psychiatry admission ? ?Family Communication: mom on phone ?  ?Author: ?Richarda Osmond, DO ?[facility] ?10/19/2021, 7:10 AM  ? ?Available by Epic secure chat 7AM-7PM. ?If 7PM-7AM, please contact night-coverage.  ?[facility] contact information found on [email or web address] ? ?

## 2021-10-19 NOTE — Progress Notes (Signed)
Physical Therapy Treatment ?Patient Details ?Name: Karen [name] ?MRN: [id number] ?DOB: 05/12/1976 ?Today's Date: 10/19/2021 ? ? ?History of Present Illness Pt admitted to [facility] on 10/04/21 after she was found down on the floor for several days, covered in urine and feces. Pt found to have UTI and met sepsis criteria with, lactic acidosis. Catatonic state being addressed via ECT. PMH significant for: depression, anxiety, seizure, thyroid nodule, hypokalemia, malnutrition, paranoid psychosis with hallucinations. ? ?  ?PT Comments  ? ? Patient sleeping on arrival to room but easily wakes to voice. She is able to follow single step commands with extra time. Patient tells me she is sleepy. She continues to require some assistance with bed mobility. She declined sit to stand transfers/OOB activity despite offering multiple times. She participated with LE exercises for strengthening while seated at edge of bed. Recommend to continue PT to maximize independence and facilitate return to prior level of function.  ?  ?Recommendations for follow up therapy are one component of a multi-disciplinary discharge planning process, led by the attending physician.  Recommendations may be updated based on patient status, additional functional criteria and insurance authorization. ? ?Follow Up Recommendations ? Skilled nursing-short term rehab (<3 hours/day) ?  ?  ?Assistance Recommended at Discharge Frequent or constant Supervision/Assistance  ?Patient can return home with the following A little help with walking and/or transfers;A little help with bathing/dressing/bathroom;Assistance with cooking/housework;Direct supervision/assist for financial management;Assist for transportation ?  ?Equipment Recommendations ? None recommended by PT  ?  ?Recommendations for Other Services   ? ? ?  ?Precautions / Restrictions Precautions ?Precautions: Fall ?Precaution Comments: Aspiration; seizure ?Restrictions ?Weight Bearing Restrictions: No  ?   ? ?Mobility ? Bed Mobility ?Overal bed mobility: Needs Assistance ?Bed Mobility: Supine to Sit ?  ?  ?Supine to sit: Min guard ?Sit to supine: Supervision ?  ?General bed mobility comments: increased time and effort required with bed mobility efforts. head of bed flat and no use of bed rails. cues for task initiation and technique ?  ? ?Transfers ?Overall transfer level: Needs assistance ?Equipment used: None ?  ?  ?  ?  ?  ?  ?  ?General transfer comment: patient refused to stand despite offering multiple times. she was able to perform lateral scoot transfer using UE support while seated on the bed with supervision. patient appears fatigued with activity. heart rate 110's and Sp02 98% on room air ?  ? ?Ambulation/Gait ?  ?  ?  ?  ?  ?  ?  ?  ? ? ?Stairs ?  ?  ?  ?  ?  ? ? ?Wheelchair Mobility ?  ? ?Modified Rankin (Stroke Patients Only) ?  ? ? ?  ?Balance Overall balance assessment: Needs assistance ?Sitting-balance support: Single extremity supported, Feet unsupported ?Sitting balance-Leahy Scale: Good ?  ?  ?  ?  ?  ?  ?  ?  ?  ?  ?  ?  ?  ?  ?  ?  ?  ? ?  ?Cognition Arousal/Alertness: Awake/alert, Lethargic ?Behavior During Therapy: Flat affect ?Overall Cognitive Status: History of cognitive impairments - at baseline ?  ?  ?  ?  ?  ?  ?  ?  ?  ?  ?  ?  ?  ?  ?  ?  ?General Comments: patient sleeping on arrival to room and awakens easily to voice. she needs increased time but is able to follow single step commands ?  ?  ? ?  ?  Exercises General Exercises - Lower Extremity ?Ankle Circles/Pumps: AAROM, Strengthening, Both, 5 reps, Seated ?Long Arc Quad: AAROM, Strengthening, Both, 5 reps, Seated ?Other Exercises ?Other Exercises: verbal cues for exercise technique/strengthening ? ?  ?General Comments   ?  ?  ? ?Pertinent Vitals/Pain Pain Assessment ?Pain Assessment: No/denies pain  ? ? ?Home Living   ?  ?  ?  ?  ?  ?  ?  ?  ?  ?   ?  ?Prior Function    ?  ?  ?   ? ?PT Goals (current goals can now be found in the  care plan section) Acute Rehab PT Goals ?Patient Stated Goal: none stated ?PT Goal Formulation: With patient ?Progress towards PT goals: Progressing toward goals ? ?  ?Frequency ? ? ? Min 2X/week ? ? ? ?  ?PT Plan Current plan remains appropriate  ? ? ?Co-evaluation   ?  ?  ?  ?  ? ?  ?AM-PAC PT "6 Clicks" Mobility   ?Outcome Measure ? Help needed turning from your back to your side while in a flat bed without using bedrails?: A Little ?Help needed moving from lying on your back to sitting on the side of a flat bed without using bedrails?: A Little ?Help needed moving to and from a bed to a chair (including a wheelchair)?: A Lot ?Help needed standing up from a chair using your arms (e.g., wheelchair or bedside chair)?: A Lot ?Help needed to walk in hospital room?: Total ?Help needed climbing 3-5 steps with a railing? : Total ?6 Click Score: 12 ? ?  ?End of Session   ?Activity Tolerance: Patient limited by fatigue ?Patient left: in bed;with call bell/phone within reach;with bed alarm set ?  ?PT Visit Diagnosis: Unsteadiness on feet (R26.81);Muscle weakness (generalized) (M62.81) ?  ? ? ?Time: 1520-1530 ?PT Time Calculation (min) (ACUTE ONLY): 10 min ? ?Charges:  $Therapeutic Activity: 8-22 mins          ?          ?Minna Merritts, PT, MPT ? ? ? ?[name] ?10/19/2021, 3:38 PM ? ?

## 2021-10-19 NOTE — Patient Instructions (Signed)
Visit Information ? ?Thank you for taking time to visit with me today. Please don't hesitate to contact me if I can be of assistance to you before our next scheduled telephone appointment. ? ?Following are the goals we discussed today:  ? ?- begin a notebook of services in my neighborhood or community ?- follow-up on any referrals for help I am given ?- think ahead to make sure my need does not become an emergency ?- have a back-up plan  ?-Continue follow up with Behavioral Health team for ECT treatments while inpatient at [facility]. ? ?Our next appointment is by telephone on 11/16/21 at 10am ? ?Please call the care guide team at [phone] if you need to cancel or reschedule your appointment.  ? ?If you are experiencing a Mental Health or [place] or need someone to talk to, please call the Suicide and Crisis Lifeline: 988  ? ?Patient verbalizes understanding of instructions and care plan provided today and agrees to view in [place]. Active MyChart status confirmed with patient.   ? ?Telephone follow up appointment with care management team member scheduled for:11/16/21 ? ? ?Tailor Lucking, LCSW ?Clinical Social Worker  ?[facility] ?[phone] ? ?

## 2021-10-20 ENCOUNTER — Inpatient Hospital Stay: Payer: Medicare Other | Admitting: Anesthesiology

## 2021-10-20 ENCOUNTER — Inpatient Hospital Stay (HOSPITAL_COMMUNITY)
Admit: 2021-10-20 | Discharge: 2021-10-20 | Disposition: A | Discharge status: Home / Self Care | Payer: Medicare Other | Attending: Internal Medicine | Admitting: Internal Medicine

## 2021-10-20 DIAGNOSIS — F061 Catatonic disorder due to known physiological condition: Secondary | ICD-10-CM

## 2021-10-20 LAB — BASIC METABOLIC PANEL
Anion gap: 8 (ref 5–15)
BUN: 19 mg/dL (ref 6–20)
CO2: 24 mmol/L (ref 22–32)
Calcium: 8.9 mg/dL (ref 8.9–10.3)
Chloride: 108 mmol/L (ref 98–111)
Creatinine, Ser: 0.61 mg/dL (ref 0.44–1.00)
GFR, Estimated: >60 mL/min (ref >60)
Glucose, Bld: 99 mg/dL (ref 70–99)
Potassium: 3.4 mmol/L — ABNORMAL LOW (ref 3.5–5.1)
Sodium: 140 mmol/L (ref 135–145)

## 2021-10-20 LAB — MAGNESIUM: Magnesium: 2.1 mg/dL (ref 1.7–2.4)

## 2021-10-20 LAB — PHOSPHORUS: Phosphorus: 4.1 mg/dL (ref 2.5–4.6)

## 2021-10-20 MED ORDER — LABETALOL HCL 5 MG/ML IV SOLN
INTRAVENOUS | Status: DC | PRN
  Administered 2021-10-20: 5 mg via INTRAVENOUS

## 2021-10-20 MED ORDER — GLYCOPYRROLATE 0.2 MG/ML IJ SOLN: 0.1000 mg | Freq: Once | INTRAMUSCULAR | Status: DC

## 2021-10-20 MED ORDER — KETOROLAC TROMETHAMINE 30 MG/ML IJ SOLN
INTRAMUSCULAR | Status: AC
  Filled 2021-10-20: qty 1

## 2021-10-20 MED ORDER — SUCCINYLCHOLINE CHLORIDE 200 MG/10ML IV SOSY
PREFILLED_SYRINGE | INTRAVENOUS | Status: DC | PRN
  Administered 2021-10-20: 60 mg via INTRAVENOUS

## 2021-10-20 MED ORDER — POTASSIUM CHLORIDE CRYS ER 20 MEQ PO TBCR
20.0000 meq | EXTENDED_RELEASE_TABLET | Freq: Once | ORAL | Status: AC
  Administered 2021-10-20: 20 meq via ORAL
  Filled 2021-10-20: qty 1

## 2021-10-20 MED ORDER — SODIUM CHLORIDE 0.9 % IV SOLN: 500.0000 mL | Freq: Once | INTRAVENOUS | Status: DC

## 2021-10-20 MED ORDER — MIDAZOLAM HCL 2 MG/2ML IJ SOLN
INTRAMUSCULAR | Status: AC
  Filled 2021-10-20: qty 2

## 2021-10-20 MED ORDER — METHOHEXITAL SODIUM 100 MG/10ML IV SOSY
PREFILLED_SYRINGE | INTRAVENOUS | Status: DC | PRN
  Administered 2021-10-20 (×2): 60 mg via INTRAVENOUS

## 2021-10-20 MED ORDER — MIDAZOLAM HCL 2 MG/2ML IJ SOLN
INTRAMUSCULAR | Status: DC | PRN
  Administered 2021-10-20: 2 mg via INTRAVENOUS

## 2021-10-20 MED ORDER — METHOHEXITAL SODIUM 0.5 G IJ SOLR
INTRAMUSCULAR | Status: AC
  Filled 2021-10-20: qty 500

## 2021-10-20 MED ORDER — MIDAZOLAM HCL 2 MG/2ML IJ SOLN: 2.0000 mg | Freq: Once | INTRAMUSCULAR | Status: DC

## 2021-10-20 NOTE — Progress Notes (Signed)
Occupational Therapy Treatment ?Patient Details ?Name: Karen [name] ?MRN: [id number] ?DOB: 26-Aug-1975 ?Today's Date: 10/20/2021 ? ? ?History of present illness Pt admitted to [facility] on 10/04/21 after she was found down on the floor for several days, covered in urine and feces. Pt found to have UTI and met sepsis criteria with, lactic acidosis. Catatonic state being addressed via ECT. PMH significant for: depression, anxiety, seizure, thyroid nodule, hypokalemia, malnutrition, paranoid psychosis with hallucinations. ?  ?OT comments ? Pt seen for OT tx this date to f/u re: safety with ADLs/ADL mobility. OT engages pt in sup to sit with SUPV and all transfers including to/from [facility] and to/from recliner with arm in arm with CGA progressing to SUPV as she is sometimes able to use arm rests without hand held assist from OT. OT engages pt in seated UB bathing with SETUP and STS with LB bathing with CGA progressing to SUPV. Pt tolerates session much better this date. While she is still somewhat lethargic at start of session, she generally rouses and participates more readily today. She is returned to bed end of session with all needs met and in reach. Her request for lunch for after treatment is relayed to the RN. Anticipate, that if pt is more consistent in her ability to participate with therapy and consistent in completing transfers with decreased assist, that her d/c may be able to be updated to home. That said, at this time, she is still waxing and waning heavily with tolerance and assist level for transfers. So will continue to recommend STR at this time d/t pt still with functional decline versus baseline with ADL transfers, but will continue to assess for opportunity to update as prudent.   ? ?Recommendations for follow up therapy are one component of a multi-disciplinary discharge planning process, led by the attending physician.  Recommendations may be updated based on patient status, additional functional criteria  and insurance authorization. ?   ?Follow Up Recommendations ? Skilled nursing-short term rehab (<3 hours/day)  ?  ?Assistance Recommended at Discharge Frequent or constant Supervision/Assistance  ?Patient can return home with the following ? A lot of help with walking and/or transfers;A lot of help with bathing/dressing/bathroom;Help with stairs or ramp for entrance;Assist for transportation;Direct supervision/assist for financial management;Direct supervision/assist for medications management ?  ?Equipment Recommendations ? Other (comment)  ?  ?Recommendations for Other Services   ? ?  ?Precautions / Restrictions Precautions ?Precautions: Fall ?Precaution Comments: Aspiration; seizure ?Restrictions ?Weight Bearing Restrictions: No  ? ? ?  ? ?Mobility Bed Mobility ?Overal bed mobility: Needs Assistance ?Bed Mobility: Supine to Sit, Sit to Supine ?  ?  ?Supine to sit: Supervision, HOB elevated ?Sit to supine: Supervision, HOB elevated ?  ?General bed mobility comments: HOB slightly elevated ~15 degrees, increased time to manage LEs. ?  ? ?Transfers ?Overall transfer level: Needs assistance ?Equipment used: None, 1 person hand held assist ?Transfers: Sit to/from Stand ?Sit to Stand: Min guard, Supervision ?  ?  ?Step pivot transfers: Min guard, Supervision ?  ?  ?General transfer comment: CGA progressing to SUPV with use of chair arm rests to control and minimal cues for safety. ?  ?  ?Balance Overall balance assessment: Needs assistance ?Sitting-balance support: Single extremity supported, Feet unsupported ?Sitting balance-Leahy Scale: Good ?Sitting balance - Comments: steady static sitting but min assist for dynamic sitting balance ?  ?Standing balance support: Bilateral upper extremity supported ?Standing balance-Leahy Scale: Poor ?Standing balance comment: min assist to steady in standing ?  ?  ?  ?  ?  ?  ?  ?  ?  ?  ?  ?   ? ?  ADL either performed or assessed with clinical judgement  ? ?ADL Overall ADL's : Needs  assistance/impaired ?  ?  ?Grooming: Wash/dry face;Oral care;Set up;Sitting ?  ?Upper Body Bathing: Set up;Sitting ?  ?Lower Body Bathing: Minimal assistance;Sit to/from stand ?Lower Body Bathing Details (indicate cue type and reason): pushes frmo arm rests of recliner and requires MIN A for thorough completion of posterior peri LB bathing, but able to complete anterior in standing wtih SBA/CGA for balance. ?Upper Body Dressing : Set up;Min guard;Sitting ?Upper Body Dressing Details (indicate cue type and reason): oriented the garment correctly for pt, then she completed the rest ?Lower Body Dressing: Minimal assistance;Sitting/lateral leans ?Lower Body Dressing Details (indicate cue type and reason): clean socks, clothing mgt over hips with mesh underwear ?Toilet Transfer: Min guard;Supervision/safety;Stand-pivot;BSC/3in1 ?Toilet Transfer Details (indicate cue type and reason): CGA intially to pivot to commode. but demos good safety and understanding of fall prevention, completes pivot back to bed from [facility] with SBA. ?Toileting- Clothing Manipulation and Hygiene: Set up;Sitting/lateral lean ?  ?  ?  ?  ?  ?  ? ?Extremity/Trunk Assessment   ?  ?  ?  ?  ?  ? ?Vision   ?  ?  ?Perception   ?  ?Praxis   ?  ? ?Cognition Arousal/Alertness: Awake/alert, Lethargic ?Behavior During Therapy: Flat affect ?Overall Cognitive Status: History of cognitive impairments - at baseline ?  ?  ?  ?  ?  ?  ?  ?  ?  ?  ?  ?  ?  ?  ?  ?  ?General Comments: pt more easily roused today, initially very slow processing, but increases speed of conversation/response wtih increased activity. She is able to follow all 1-2 steps commands. ?  ?  ?   ?Exercises Other Exercises ?Other Exercises: ed re: importance of OOB activity for prevention of infection, skin breakdown, and atrophy. Pt with moderate reception. Particiaptes in bathing and dressing tasks with OT. ? ?  ?Shoulder Instructions   ? ? ?  ?General Comments    ? ? ?Pertinent Vitals/ Pain        Pain Assessment ?Pain Assessment: 0-10 ?Pain Score: 2  ?Pain Location: says everything hurts with movement, but demos no outward signs and does not specify ?Pain Intervention(s): Monitored during session, Repositioned ? ?Home Living   ?  ?  ?  ?  ?  ?  ?  ?  ?  ?  ?  ?  ?  ?  ?  ?  ?  ?  ? ?  ?Prior Functioning/Environment    ?  ?  ?  ?   ? ?Frequency ? Min 2X/week  ? ? ? ? ?  ?Progress Toward Goals ? ?OT Goals(current goals can now be found in the care plan section) ? Progress towards OT goals: Progressing toward goals ? ?Acute Rehab OT Goals ?Patient Stated Goal: to go home ?OT Goal Formulation: With patient ?Time For Goal Achievement: 10/30/21 ?Potential to Achieve Goals: Good  ?Plan Discharge plan remains appropriate;Frequency remains appropriate   ? ?Co-evaluation ? ? ?   ?  ?  ?  ?  ? ?  ?AM-PAC OT "6 Clicks" Daily Activity     ?Outcome Measure ? ? Help from another person eating meals?: A Little ?Help from another person taking care of personal grooming?: A Little ?Help from another person toileting, which includes using toliet, bedpan, or urinal?: A Lot ?Help from another person bathing (including washing, rinsing,  drying)?: A Lot ?Help from another person to put on and taking off regular upper body clothing?: A Little ?Help from another person to put on and taking off regular lower body clothing?: A Lot ?6 Click Score: 15 ? ?  ?End of Session   ? ?OT Visit Diagnosis: Muscle weakness (generalized) (M62.81);Other symptoms and signs involving the nervous system (R29.898) ?  ?Activity Tolerance Patient tolerated treatment well;Patient limited by lethargy ?  ?Patient Left in bed;with call bell/phone within reach;with bed alarm set ?  ?Nurse Communication Mobility status;Other (comment) (lunch order for after treatment relayd to RN) ?  ? ?   ? ?Time: 7737-3668 ?OT Time Calculation (min): 44 min ? ?Charges: OT General Charges ?$OT Visit: 1 Visit ?OT Treatments ?$Self Care/Home Management : 23-37  mins ?$Therapeutic Activity: 8-22 mins ? ?Gerrianne Scale, [place], OTR/L ?ascom [phone] ?10/20/21, 2:53 PM  ?

## 2021-10-20 NOTE — Progress Notes (Signed)
PHARMACY CONSULT NOTE - FOLLOW UP ? ?Pharmacy Consult for Electrolyte Monitoring and Replacement  ? ?Recent Labs: ?Potassium (mmol/L)  ?Date Value  ?10/20/2021 3.4 (L)  ?02/19/2014 3.9  ? ?Magnesium (mg/dL)  ?Date Value  ?10/20/2021 2.1  ? ?Calcium (mg/dL)  ?Date Value  ?10/20/2021 8.9  ? ?Calcium, Total (mg/dL)  ?Date Value  ?02/19/2014 9.0  ? ?Albumin (g/dL)  ?Date Value  ?10/04/2021 3.6  ?10/15/2019 4.5  ?02/19/2014 3.6  ? ?Phosphorus (mg/dL)  ?Date Value  ?10/20/2021 4.1  ? ?Sodium (mmol/L)  ?Date Value  ?10/20/2021 140  ?10/15/2019 140  ?02/19/2014 140  ? ? ? ?Assessment: ?Pharmacy has been consulted to monitor and replace electrolytes in 46yo patient admitted with sepsis secondary to UTI. She is currently in a catatonic state and per psychology, will start patient on ECT. Patient with significant weight loss of more than 30 pounds. Dietician also following. ? ? Mighty Shake TID with meals plus calorie counting. Per MD notes, patient responding to treatment with stable BG and eating more. Today NPO for ECT, then plan to resume normal diet with aspiration precautions. ? ?Goal of Therapy:  ?Electrolytes WNL ? ?Plan:  ?K 3.4. Will replete with Kcl 63mq PO x1 ?Mg, Na, and phos all within normal limits. Corrected calcium at 9.2 will not  need replacement either. ?Pharmacy will continue to follow and replace electrolytes as needed. ? ?ADarrick Penna PharmD, MS PGPM ?Clinical Pharmacist ?10/20/2021 ?9:29 AM ? ?

## 2021-10-20 NOTE — Transfer of Care (Signed)
Immediate Anesthesia Transfer of Care Note ? ?Patient: Karen [name] ? ?Procedure(s) Performed: ECT TX ? ?Patient Location: PACU ? ?Anesthesia Type:General ? ?Level of Consciousness: drowsy ? ?Airway & Oxygen Therapy: Patient Spontanous Breathing and Patient connected to face mask ? ?Post-op Assessment: Report given to RN and Post -op Vital signs reviewed and stable ? ?Post vital signs: Reviewed and stable ? ?Last Vitals:  ?Vitals Value Taken Time  ?BP    ?Temp    ?Pulse    ?Resp    ?SpO2    ? ? ?Last Pain:  ?Vitals:  ? 10/20/21 1223  ?TempSrc: Tympanic  ?PainSc: 0-No pain  ?   ? ?  ? ?Complications: No notable events documented. ?

## 2021-10-20 NOTE — Anesthesia Preprocedure Evaluation (Signed)
Anesthesia Evaluation  Patient identified by MRN, date of birth, ID band Patient awake    Reviewed: Allergy & Precautions, NPO status , Patient's Chart, lab work & pertinent test results  History of Anesthesia Complications (+) PROLONGED EMERGENCE and history of anesthetic complications  Airway Mallampati: III  TM Distance: >3 FB Neck ROM: full    Dental  (+) Chipped, Poor Dentition, Missing   Pulmonary asthma , sleep apnea ,    Pulmonary exam normal        Cardiovascular (-) Past MI negative cardio ROS Normal cardiovascular exam     Neuro/Psych  Headaches, Seizures -, Well Controlled,  PSYCHIATRIC DISORDERS  Neuromuscular disease    GI/Hepatic negative GI ROS, Neg liver ROS, neg GERD  ,  Endo/Other  negative endocrine ROS  Renal/GU negative Renal ROS  negative genitourinary   Musculoskeletal  (+) Arthritis , Fibromyalgia -  Abdominal   Peds  Hematology negative hematology ROS (+)   Anesthesia Other Findings Past Medical History: No date: Ehlers-Danlos syndrome No date: Fibromyalgia No date: Headache No date: Sleep apnea No date: Tics of organic origin No date: Transient alteration of awareness  Past Surgical History: 02/13/2018: COLONOSCOPY WITH PROPOFOL; N/A     Comment:  Procedure: COLONOSCOPY WITH PROPOFOL;  Surgeon: Wohl,               Darren, MD;  Location: [facility] END[facility];  Service:               Endoscopy;  Laterality: N/A; 2009: DILATION AND CURETTAGE OF UTERUS 02/13/2018: ESOPHAGOGASTRODUODENOSCOPY (EGD) WITH PROPOFOL     Comment:  Procedure: ESOPHAGOGASTRODUODENOSCOPY (EGD) WITH               PROPOFOL;  Surgeon: Wohl, Darren, MD;  Location: [facility]               ENDOSCOPY;  Service: Endoscopy;; 1990: EYE SURGERY; Left     Comment:  3 Surgeries on left eye to correct crossed eye 2003: LAPAROSCOPY; Left     Comment:  Fallopian Tube 1998: MANDIBLE SURGERY 1987: OTHER SURGICAL HISTORY; Right      Comment:  3 Surgeries to repair broken arm No date: OTHER SURGICAL HISTORY; Left     Comment:  3 surgeries on her left thigh as an infant 1998: OTHER SURGICAL HISTORY     Comment:  Jaw surgery 1987: Right arm surgery     Comment:  x 3 due to fracture 06/13/2019: TOENAIL EXCISION; Bilateral     Comment:  Procedure: BILATERAL SECOND TOE PARTIAL NAIL ABLATION;                Surgeon: Adair, Christopher R, MD;  Location: [facility];  Service: Orthopedics;  Laterality:               Bilateral;  SURGERY REQUEST TIME 1 HOUR 12/13/2002: TONSILLECTOMY     Reproductive/Obstetrics negative OB ROS                             Anesthesia Physical  Anesthesia Plan  ASA: 3  Anesthesia Plan: General   Post-op Pain Management:    Induction: Intravenous  PONV Risk Score and Plan: TIVA  Airway Management Planned: Mask  Additional Equipment:   Intra-op Plan:   Post-operative Plan:   Informed Consent: I have reviewed the patients History and Physical, chart, labs   and discussed the procedure including the risks, benefits and alternatives for the proposed anesthesia with the patient or authorized representative who has indicated his/her understanding and acceptance.     Dental Advisory Given  Plan Discussed with: Anesthesiologist, CRNA and Surgeon  Anesthesia Plan Comments: (History and phone consent from the patients mother [name] [place]  Mother consented for risks of anesthesia including but not limited to:  - adverse reactions to medications - risk of airway placement if required - damage to eyes, teeth, lips or other oral mucosa - nerve damage due to positioning  - sore throat or hoarseness - Damage to heart, brain, nerves, lungs, other parts of body or loss of life  She voiced understanding.)        Anesthesia Quick Evaluation  

## 2021-10-20 NOTE — H&P (Signed)
Karen [name] is an 46 y.o. female.   ?Chief Complaint: still fatigued ?HPI: catatonia ? ?Past Medical History:  ?Diagnosis Date  ? Ehlers-Danlos syndrome   ? Fibromyalgia   ? Headache   ? Sleep apnea   ? Tics of organic origin   ? Transient alteration of awareness   ? ? ?Past Surgical History:  ?Procedure Laterality Date  ? COLONOSCOPY WITH PROPOFOL N/A 02/13/2018  ? Procedure: COLONOSCOPY WITH PROPOFOL;  Surgeon: [name], MD;  Location: [facility] ENDOSCOPY;  Service: Endoscopy;  Laterality: N/A;  ? [place] OF UTERUS  2009  ? ESOPHAGOGASTRODUODENOSCOPY (EGD) WITH PROPOFOL  02/13/2018  ? Procedure: ESOPHAGOGASTRODUODENOSCOPY (EGD) WITH PROPOFOL;  Surgeon: [name], MD;  Location: [facility] ENDOSCOPY;  Service: Endoscopy;;  ? EYE SURGERY Left 1990  ? 3 Surgeries on left eye to correct crossed eye  ? LAPAROSCOPY Left 2003  ? Fallopian Tube  ? [place]  ? OTHER SURGICAL HISTORY Right 1987  ? 3 Surgeries to repair broken arm  ? OTHER SURGICAL HISTORY Left   ? 3 surgeries on her left thigh as an infant  ? OTHER SURGICAL HISTORY  1998  ? Jaw surgery  ? Right arm surgery  1987  ? x 3 due to fracture  ? TOENAIL EXCISION Bilateral 06/13/2019  ? Procedure: BILATERAL SECOND TOE PARTIAL NAIL ABLATION;  Surgeon: Erle Crocker, MD;  Location: [place];  Service: Orthopedics;  Laterality: Bilateral;  SURGERY REQUEST TIME 1 HOUR  ? TONSILLECTOMY  12/13/2002  ? ? ?Family History  ?Problem Relation Age of Onset  ? Depression Mother   ? Stroke Mother   ? Fibromyalgia Mother   ? Osteoarthritis Mother   ? Asthma Brother   ? Depression Brother   ? Migraines Brother   ? Asperger's syndrome Brother   ? Other Brother   ?     Ehlers-Danlos Syndrome  ? Cancer Maternal Aunt   ? Asperger's syndrome Maternal Aunt   ? Breast cancer Maternal Aunt   ? Heart disease Paternal Aunt   ? Heart disease Paternal Uncle   ? Cervical cancer Maternal Grandmother   ? Osteoporosis Maternal Grandmother   ?      Died at 39  ? Osteoarthritis Maternal Grandmother   ? Colon cancer Maternal Grandfather   ? Asthma Maternal Grandfather   ? Asperger's syndrome Maternal Grandfather   ? Emphysema Maternal Grandfather   ?     Died at 72  ? Prostate cancer Maternal Grandfather   ? Heart attack Paternal Grandfather   ?     Died at 23  ? Asthma Paternal Grandfather   ? Tuberculosis Paternal Grandfather   ? Cancer Cousin   ? Asperger's syndrome Cousin   ? Asperger's syndrome Other   ? Heart Problems Paternal Grandmother   ?     Died at 52  ? Ehlers-Danlos syndrome Father   ? Ehlers-Danlos syndrome Brother   ? ?Social History:  reports that she has never smoked. She has never used smokeless tobacco. She reports that she does not drink alcohol and does not use drugs. ? ?Allergies:  ?Allergies  ?Allergen Reactions  ? Augmentin  [Amoxicillin-Pot Clavulanate] Diarrhea  ? Chocolate Flavor   ?  Other reaction(s): Other (See Comments) ?Wheezing, acne  ? Demerol [Meperidine] Other (See Comments)  ?  Slow to wake up when this drug is given.   ? Keflex [Cephalexin] Nausea And Vomiting  ? Morphine And Related Nausea And Vomiting  ?  Tape Dermatitis  ?  Must use paper tape only  ? Toradol [Ketorolac Tromethamine] Nausea And Vomiting  ? Amoxicillin   ?  Other reaction(s): Unknown  ? Chocolate   ?  GI distress  ? Nsaids   ?  patient develops ulcers ?Other reaction(s): Other (See Comments) ?patient develops ulcers  ? Strawberry Extract   ?  GI distress  ? ? ?(Not in a hospital admission) ? ? ?Results for orders placed or performed during the hospital encounter of 10/04/21 (from the past 48 hour(s))  ?Phosphorus     Status: None  ? Collection Time: 10/20/21  5:34 AM  ?Result Value Ref Range  ? Phosphorus 4.1 2.5 - 4.6 mg/dL  ?  Comment: Performed at [facility], [address], [place], [place] [address]  ?Magnesium     Status: None  ? Collection Time: 10/20/21  5:34 AM  ?Result Value Ref Range  ? Magnesium 2.1 1.7 - 2.4 mg/dL  ?  Comment:  Performed at [facility], [address] [place], [place] [address]  ?Basic metabolic panel     Status: Abnormal  ? Collection Time: 10/20/21  5:34 AM  ?Result Value Ref Range  ? Sodium 140 135 - 145 mmol/L  ? Potassium 3.4 (L) 3.5 - 5.1 mmol/L  ? Chloride 108 98 - 111 mmol/L  ? CO2 24 22 - 32 mmol/L  ? Glucose, Bld 99 70 - 99 mg/dL  ?  Comment: Glucose reference range applies only to samples taken after fasting for at least 8 hours.  ? BUN 19 6 - 20 mg/dL  ? Creatinine, Ser 0.61 0.44 - 1.00 mg/dL  ? Calcium 8.9 8.9 - 10.3 mg/dL  ? GFR, Estimated >60 >60 mL/min  ?  Comment: (NOTE) ?Calculated using the CKD-EPI Creatinine Equation (2021) ?  ? Anion gap 8 5 - 15  ?  Comment: Performed at [facility], [name], [place], [place] [address]  ? ?No results found. ? ?Review of Systems  ?Unable to perform ROS: Psychiatric disorder  ?Skin: Negative.   ? ?There were no vitals taken for this visit. ?Physical Exam ?Vitals and nursing note reviewed.  ?Constitutional:   ?   Appearance: She is well-developed.  ?HENT:  ?   Head: Normocephalic and atraumatic.  ?Eyes:  ?   Conjunctiva/sclera: Conjunctivae normal.  ?   Pupils: Pupils are equal, round, and reactive to light.  ?Cardiovascular:  ?   Heart sounds: Normal heart sounds.  ?Pulmonary:  ?   Effort: Pulmonary effort is normal.  ?Abdominal:  ?   Palpations: Abdomen is soft.  ?Musculoskeletal:     ?   General: Normal range of motion.  ?   Cervical back: Normal range of motion.  ?Skin: ?   General: Skin is warm and dry.  ?Neurological:  ?   Mental Status: She is alert.  ?Psychiatric:     ?   Mood and Affect: Affect is blunt.  ?  ? ?Assessment/Plan ?Continue index treatment ? ?Alethia Berthold, MD ?10/20/2021, 11:52 AM ? ? ? ?

## 2021-10-20 NOTE — Progress Notes (Signed)
?PROGRESS NOTE ? ?Karen [name]    DOB: September 02, 1975, 46 y.o.  ?[id number]  ?  Code Status: Full Code   ?DOA: 10/04/2021   LOS: 15  ? ?Brief hospital course  ?Karen [name] is a 46 y.o. female with a PMH significant for Erler's Danlos syndrome, fibromyalgia, sleep apnea, transient alteration of awareness, tics of organic origin, headaches. ?They presented from home to the ED on 10/04/2021 with found down x several days days.  She was found lying on the floor covered with urine and feces ?In the ED, it was found that they had UTI and met sepsis criteria with, lactic acidosis.  ?They were treated with ceftriaxone and IV fluids.  ?Patient was admitted to medicine service for further workup and management of AMS as outlined in detail below. ? ?10/20/21 -stable ? ?Assessment & Plan  ?Principal Problem: ?  Catatonia ?Active Problems: ?  Depression with anxiety ?  Seizure disorder ([place]) ?  Sepsis secondary to UTI [facility]) ?  AMS (altered mental status) ?  Thyroid nodule ?  Hypokalemia ?  Elevated troponin ?  Depression, major, recurrent, severe with psychosis ([place]) ?  Malnutrition of moderate degree ?  Encounter for nasogastric (NG) tube placement ?  Urinary tract infection without hematuria ? ?History of paranoid psychosis with hallucinations and delusions/catatonia ?Severe major depression and anxiety-patient presented in catatonic state and is showing improvement after ECT. Today, patient is alert and conversant appropriately. Seems that she continues to have longer periods of alertness progressing with more treatments.  ?Spoke with patient's mom today who states that she will not be able to provide the level of care that her daughter requires at this time. If/when she becomes independent, she can live with her mom again but will need discharge to SNF vs inpatient psychiatric placement at discharge pending how she progresses with ECT, medications, therapy. ?-Psychiatry following, appreciate recommendations ? -ECT  2/27- ? -Continue Zyprexa increased to 31m nightly ?- PT/OT ? ?Sepsis-sepsis physiology has resolved.  S/p CTX for UTI ? ?Elevated troponins on admission-likely from demand ischemia.  No cardiac history, unable to assess if she had chest pain, echo unremarkable. Cardiology evaluated ? ?Failure to thrive-poor nutrition status due to poor PO intake in sedated state and did not tolerate NG tube. Blood sugars have remained stable. Patient is NPO prior to ECT treatments. Optimistically she is eating more so will not need enteral feeds. As she is responding to treatment, will need to monitor closely for refeeding syndrome. ?Phosphorus, magnesium normal. Down approximately 40 pounds from her weight prior to her current illness.  ?- strict I/O ?- calorie count ?- nutrition consult ?-Encourage p.o. intake ?- discontinued IV fluids as she has increased PO intake.  ? ?Thyroid nodule- thyroid ultrasound Diffusely heterogeneous and enlarged thyroid gland most consistent with diffuse goitrous change. ? - Recommend follow-up ultrasound in 1 year ? ?Body mass index is 23.78 kg/m?. ? ?VTE ppx: enoxaparin (LOVENOX) injection 40 mg Start: 10/11/21 2200 ?SCDs Start: 10/04/21 2355 ? ?Diet:  ?   ?Diet  ? Diet NPO time specified  ? Diet regular Room service appropriate? Yes; Fluid consistency: Thin  ? ?Subjective 10/20/21   ? ?Pt was seated on bedside commode, working with PT when seen this morning.  She reports feeling like she may have a bladder infection, having some discomfort with urination but denies fevers chills.  PT does report that she was diaphoretic during their session earlier this morning. ?  ?Objective  ? ?Vitals:  ?  10/20/21 0402 10/20/21 0830 10/20/21 0831 10/20/21 1403  ?BP: 105/75 (!) 88/62 (!) 87/64 102/74  ?Pulse: 82 86 88 84  ?Resp: _0 ?Temp: 98.4 ?F (36.9 ?C) 98.2 ?F (36.8 ?C)  97.6 ?F (36.4 ?C)  ?TempSrc:      ?SpO2: 99% 98%  99%  ?Weight:      ?Height:      ? ? ?Intake/Output Summary (Last 24 hours) at  10/20/2021 1404 ?Last data filed at 10/20/2021 1300 ?Gross per 24 hour  ?Intake 200 ml  ?Output --  ?Net 200 ml  ? ?Filed Weights  ? 10/04/21 1900 10/17/21 0500  ?Weight: 60.8 kg 60.9 kg  ?  ? ?Physical Exam:  ?General: awake, NAD, on bedside, ?HEENT: atraumatic, clear conjunctiva, anicteric sclera, MMM ?Respiratory: normal respiratory effort, lungs clear bilaterally. ?Cardiovascular: normal S1/S2, RRR, no peripheral edema ?Nervous: alert and oriented to self and place, grossly nonfocal exam ?Skin: dry, intact, normal temperature ?Psych: alert, answers questions appropriately ? ?Labs & data reviewed  ? ? ?Labs notable for potassium 3.4 otherwise normal BMP including magnesium and phosphorus ? ? ? ?Disposition Plan & Communication  ?Patient status: Inpatient  ?Admitted From: Home ?Planned disposition location: SNF vs inpatient psych ?Anticipated discharge date: TBD pending continuing ECT therapy, rough estimate would be 3/10 and might need inpatient psychiatry admission ? ?Family Communication: mom on phone ?  ?Author: ?Ezekiel Slocumb, DO ?[facility] ?10/20/2021, 2:04 PM  ? ? ? ?

## 2021-10-20 NOTE — Progress Notes (Signed)
Nutrition Follow-up ? ?DOCUMENTATION CODES:  ? ?Non-severe (moderate) malnutrition in context of chronic illness ? ?INTERVENTION:  ? ?-Continue MVI with minerals daily ?-Continue Boost Breeze po TID, each supplement provides 250 kcal and 9 grams of protein  ? ?NUTRITION DIAGNOSIS:  ? ?Moderate Malnutrition related to chronic illness (psychiatric illness) as evidenced by mild fat depletion, moderate fat depletion, mild muscle depletion, moderate muscle depletion. ? ?Ongoing ? ?GOAL:  ? ?Patient will meet greater than or equal to 90% of their needs ? ?Progressing  ? ?MONITOR:  ? ?PO intake, Supplement acceptance, Labs, Weight trends, Skin, I & O's ? ?REASON FOR ASSESSMENT:  ? ?Consult ?Assessment of nutrition requirement/status ? ?ASSESSMENT:  ? ?Karen [name] is a 46 y.o. female with medical history significant of altered mental status, takes, psychosis, Ehlers-Danlos syndrome, fibromyalgia presenting by EMS for lying on the floor for last several days and not moving Per ED providers note.  Patient unable to provide history no family at bedside.  Patient has a history of psychotic episodes in the past patient has been noted to be urinating and defecating on herself per ED note.  The admission requested for urinary tract infection. ? ? 2/27- ECT treatments initiated ? ?Reviewed I/O's: +240 ml x 24 hours and +7 L since 10/06/21 ? ?Pt continues to make good progress with ECT treatments. Per psychiatry notes, pt much more alert after treatment on Monday. Pt NPO today for ECT treatment.  ? ?Intake continues to improved. Noted meal completions 100%. Pt is also accepting Boost Breeze supplements.   ? ?Labs reviewed: K: 3.4, CBGS: 124 (inpatient orders for glycemic control are none).   ? ?Diet Order:   ?Diet Order   ? ?       ?  Diet NPO time specified  Diet effective midnight       ?  ? ?  ?  ? ?  ? ? ?EDUCATION NEEDS:  ? ?Not appropriate for education at this time ? ?Skin:  Skin Assessment: Reviewed RN  Assessment ? ?Last BM:  10/18/21 ? ?Height:  ? ?Ht Readings from Last 1 Encounters:  ?10/04/21 '5\' 3"'$  (1.6 m)  ? ? ?Weight:  ? ?Wt Readings from Last 1 Encounters:  ?10/17/21 60.9 kg  ? ? ?Ideal Body Weight:  52.3 kg ? ?BMI:  Body mass index is 23.78 kg/m?. ? ?Estimated Nutritional Needs:  ? ?Kcal:  1800-2000 ? ?Protein:  90-105 grams ? ?Fluid:  > 1.8 L ? ? ? ?Loistine Chance, RD, LDN, CDCES ?Registered Dietitian II ?Certified Diabetes Care and Education Specialist ?Please refer to [facility] for RD and/or RD on-call/weekend/after hours pager  ?

## 2021-10-20 NOTE — Procedures (Signed)
ECT SERVICES ?Physician?s Interval Evaluation & Treatment Note ? ?Patient Identification: Karen [name] ?MRN:  [id number] ?Date of Evaluation:  10/20/2021 ?TX #: [id number] ? ?MADRS:  ? ?MMSE:  ? ?P.E. Findings: ? ?Still pretty withdrawn and flat no worse than previously ? ?Psychiatric Interval Note: ? ?More consistently alert and able to answer questions even participate in the time out ? ?Subjective: ? ?Patient is a 46 y.o. female seen for evaluation for Electroconvulsive Therapy. ?No complaint ? ?Treatment Summary: ? ? '[]'$   Right Unilateral             '[x]'$  Bilateral ?  ?% Energy : 1.0 ms 40% ? ? Impedance: 1410 ohms ? ?Seizure Energy Index: [phone] ?V squared ? ?Postictal Suppression Index: 61% ? ?Seizure Concordance Index: 86% ? ?Medications ? ?Pre Shock: Brevital 60 mg succinylcholine 60 mg ? ?Post Shock: Versed 2 mg ? ?Seizure Duration: 18 seconds EMG 49 seconds EEG ? ? ?Comments: ?Next treatment Friday ? ?Lungs: ? ?'[x]'$   Clear to auscultation              ? ?'[]'$  Other:  ? ?Heart: ?  ? '[x]'$   Regular rhythm             '[]'$  irregular rhythm ? ? ? '[x]'$   Previous H&P reviewed, patient examined and there are NO CHANGES              ?  ? '[]'$   Previous H&P reviewed, patient examined and there are changes noted. ? ? ?Alethia Berthold, MD ?3/8/20233:23 PM ? ?  ?

## 2021-10-21 ENCOUNTER — Other Ambulatory Visit: Payer: Self-pay | Admitting: Psychiatry

## 2021-10-21 DIAGNOSIS — E041 Nontoxic single thyroid nodule: Secondary | ICD-10-CM | POA: Diagnosis not present

## 2021-10-21 DIAGNOSIS — F061 Catatonic disorder due to known physiological condition: Secondary | ICD-10-CM | POA: Diagnosis not present

## 2021-10-21 DIAGNOSIS — R627 Adult failure to thrive: Secondary | ICD-10-CM | POA: Diagnosis not present

## 2021-10-21 DIAGNOSIS — E876 Hypokalemia: Secondary | ICD-10-CM | POA: Diagnosis not present

## 2021-10-21 LAB — BASIC METABOLIC PANEL
Anion gap: 10 (ref 5–15)
BUN: 24 mg/dL — ABNORMAL HIGH (ref 6–20)
CO2: 24 mmol/L (ref 22–32)
Calcium: 8.5 mg/dL — ABNORMAL LOW (ref 8.9–10.3)
Chloride: 110 mmol/L (ref 98–111)
Creatinine, Ser: 0.69 mg/dL (ref 0.44–1.00)
GFR, Estimated: >60 mL/min (ref >60)
Glucose, Bld: 107 mg/dL — ABNORMAL HIGH (ref 70–99)
Potassium: 3.1 mmol/L — ABNORMAL LOW (ref 3.5–5.1)
Sodium: 144 mmol/L (ref 135–145)

## 2021-10-21 LAB — MAGNESIUM: Magnesium: 2 mg/dL (ref 1.7–2.4)

## 2021-10-21 LAB — PHOSPHORUS: Phosphorus: 3.7 mg/dL (ref 2.5–4.6)

## 2021-10-21 MED ORDER — POTASSIUM CHLORIDE CRYS ER 20 MEQ PO TBCR
40.0000 meq | EXTENDED_RELEASE_TABLET | ORAL | Status: AC
  Administered 2021-10-21 (×2): 40 meq via ORAL
  Filled 2021-10-21 (×2): qty 2

## 2021-10-21 NOTE — Progress Notes (Signed)
PHARMACY CONSULT NOTE - FOLLOW UP ? ?Pharmacy Consult for Electrolyte Monitoring and Replacement  ? ?Recent Labs: ?Potassium (mmol/L)  ?Date Value  ?10/21/2021 3.1 (L)  ?02/19/2014 3.9  ? ?Magnesium (mg/dL)  ?Date Value  ?10/21/2021 2.0  ? ?Calcium (mg/dL)  ?Date Value  ?10/21/2021 8.5 (L)  ? ?Calcium, Total (mg/dL)  ?Date Value  ?02/19/2014 9.0  ? ?Albumin (g/dL)  ?Date Value  ?10/04/2021 3.6  ?10/15/2019 4.5  ?02/19/2014 3.6  ? ?Phosphorus (mg/dL)  ?Date Value  ?10/21/2021 3.7  ? ?Sodium (mmol/L)  ?Date Value  ?10/21/2021 144  ?10/15/2019 140  ?02/19/2014 140  ? ? ? ?Assessment: ?Pharmacy has been consulted to monitor and replace electrolytes in 45yo patient admitted with sepsis secondary to UTI. She is currently in a catatonic state and per psychology, will start patient on ECT. Patient with significant weight loss of more than 30 pounds. Dietician also following. ? ? Mighty Shake TID with meals plus calorie counting. Per MD notes, patient responding to treatment with stable BG and eating more. Today NPO for ECT, then plan to resume normal diet with aspiration precautions. ? ?Goal of Therapy:  ?Electrolytes WNL ? ?Plan:  ?K 3.1. Orders placed to replete with Kcl 40 mEq PO x2 ?Mg, Na, and phos all within normal limits.  ?Pharmacy will continue to follow and replace electrolytes as needed. ? ?Darrick Penna, PharmD, MS PGPM ?Clinical Pharmacist ?10/21/2021 ?10:20 AM ? ?

## 2021-10-21 NOTE — Progress Notes (Signed)
?PROGRESS NOTE ? ?Karen [name]    DOB: 1976/08/06, 46 y.o.  ?[id number]  ?  Code Status: Full Code   ?DOA: 10/04/2021   LOS: 16  ? ?Brief hospital course  ?Karen [name] is a 46 y.o. female with a PMH significant for Erler's Danlos syndrome, fibromyalgia, sleep apnea, transient alteration of awareness, tics of organic origin, headaches. ?They presented from home to the ED on 10/04/2021 with found down x several days days.  She was found lying on the floor covered with urine and feces ?In the ED, it was found that they had UTI and met sepsis criteria with, lactic acidosis.  ?They were treated with ceftriaxone and IV fluids.  ?Patient was admitted to medicine service for further workup and management of AMS as outlined in detail below. ? ?10/21/21 -stable ? ?Assessment & Plan  ?Principal Problem: ?  Catatonia ?Active Problems: ?  Depression with anxiety ?  Seizure disorder ([place]) ?  Sepsis secondary to UTI [facility]) ?  AMS (altered mental status) ?  Thyroid nodule ?  Hypokalemia ?  Elevated troponin ?  Depression, major, recurrent, severe with psychosis ([place]) ?  Malnutrition of moderate degree ?  Encounter for nasogastric (NG) tube placement ?  Urinary tract infection without hematuria ? ?History of paranoid psychosis with hallucinations and delusions/catatonia ?Severe major depression and anxiety-patient presented in catatonic state and is showing improvement after ECT. Today, patient is alert and conversant appropriately. Seems that she continues to have longer periods of alertness progressing with more treatments.  ?Spoke with patient's mom today who states that she will not be able to provide the level of care that her daughter requires at this time. If/when she becomes independent, she can live with her mom again but will need discharge to SNF vs inpatient psychiatric placement at discharge pending how she progresses with ECT, medications, therapy. ?-Psychiatry following, appreciate recommendations ? -ECT  2/27- ? -Continue Zyprexa increased to 31m nightly ?- PT/OT ? ?Hypokalemia -K3.1 this morning.  Replacing with 40x2 oral K-CL.  Monitor replace as needed ? ?Sepsis-sepsis physiology has resolved.  S/p CTX for UTI ? ?Elevated troponins on admission-likely from demand ischemia.  No cardiac history, unable to assess if she had chest pain, echo unremarkable. Cardiology evaluated ? ?Failure to thrive-poor nutrition status due to poor PO intake in sedated state and did not tolerate NG tube. Blood sugars have remained stable. Patient is NPO prior to ECT treatments. Optimistically she is eating more so will not need enteral feeds. As she is responding to treatment, will need to monitor closely for refeeding syndrome. ?Phosphorus, magnesium normal. Down approximately 40 pounds from her weight prior to her current illness.  ?- strict I/O ?- calorie count ?- nutrition consult ?-Encourage p.o. intake ?- discontinued IV fluids as she has increased PO intake.  ? ?Thyroid nodule- thyroid ultrasound Diffusely heterogeneous and enlarged thyroid gland most consistent with diffuse goitrous change. ? - Recommend follow-up ultrasound in 1 year ? ?Body mass index is 23.78 kg/m?. ? ?VTE ppx: enoxaparin (LOVENOX) injection 40 mg Start: 10/11/21 2200 ?SCDs Start: 10/04/21 2355 ? ?Diet:  ?   ?Diet  ? Diet regular Room service appropriate? Yes; Fluid consistency: Thin  ? ?Subjective 10/21/21   ? ?Pt was seated on bedside commode, working with PT when seen this morning.  She reports feeling like she may have a bladder infection, having some discomfort with urination but denies fevers chills.  PT does report that she was diaphoretic during their session earlier  this morning. ?  ?Objective  ? ?Vitals:  ? 10/20/21 1752 10/20/21 2003 10/21/21 8295 10/21/21 0803  ?BP: (!) 142/79 (!) 129/99 92/66 102/68  ?Pulse: (!) 107 (!) 110 77 75  ?Resp: '16 19 17 16  ' ?Temp: 98.1 ?F (36.7 ?C) 98.1 ?F (36.7 ?C) 98 ?F (36.7 ?C) 97.8 ?F (36.6 ?C)  ?TempSrc:       ?SpO2:  98% 99% 99%  ?Weight:      ?Height:      ? ? ?Intake/Output Summary (Last 24 hours) at 10/21/2021 1309 ?Last data filed at 10/20/2021 2223 ?Gross per 24 hour  ?Intake 3 ml  ?Output --  ?Net 3 ml  ? ?Filed Weights  ? 10/04/21 1900 10/17/21 0500  ?Weight: 60.8 kg 60.9 kg  ?  ? ?Physical Exam:  ?General: Sleeping comfortably, no acute distress ?Respiratory: normal respiratory effort, lungs clear bilaterally. ?Cardiovascular: normal S1/S2, RRR, no peripheral edema ?Nervous: Unable to evaluate at this time due to his patient's somnolence ?Skin: dry, intact, normal temperature ?Psych: Unable to evaluate at this time due to patient's somnolence ? ?Labs & data reviewed  ? ? ?Labs notable for potassium 3.1, calcium 8.5 otherwise normal BMP including magnesium and phosphorus. ? ? ? ?Disposition Plan & Communication  ?Patient status: Inpatient  ?Admitted From: Home ?Planned disposition location: SNF vs inpatient psych ?Anticipated discharge date: TBD pending continuing ECT therapy, rough estimate would be 3/10 and might need inpatient psychiatry admission ? ?Family Communication: mom on phone ?  ?Author: ?Ezekiel Slocumb, DO ?[facility] ?10/21/2021, 1:09 PM  ? ? ? ?

## 2021-10-21 NOTE — TOC Progression Note (Signed)
Transition of Care (TOC) - Progression Note  ? ? ?Patient Details  ?Name: Karen [name] ?MRN: [id number] ?Date of Birth: 05/23/1976 ? ?Transition of Care (TOC) CM/SW Contact  ?Pete Pelt, RN ?Phone Number: ?10/21/2021, 9:47 AM ? ?Clinical Narrative:   RNCM left message for APS worker Bynum Bellows [phone], awaiting return call. ? ? ? ?Expected Discharge Plan:  (Home with parents as caregivers) ?Barriers to Discharge: Continued Medical Work up ? ?Expected Discharge Plan and Services ?Expected Discharge Plan:  (Home with parents as caregivers) ?  ?Discharge Planning Services: CM Consult ?  ?Living arrangements for the past 2 months: [place] ?                ?  ?  ?  ?  ?  ?  ?  ?  ?  ?  ? ? ?Social Determinants of Health (SDOH) Interventions ?  ? ?Readmission Risk Interventions ?No flowsheet data found. ? ?

## 2021-10-21 NOTE — Progress Notes (Addendum)
Physical Therapy Treatment ?Patient Details ?Name: Karen [name] ?MRN: [id number] ?DOB: 12-25-1975 ?Today's Date: 10/21/2021 ? ? ?History of Present Illness Pt admitted to [facility] on 10/04/21 after she was found down on the floor for several days, covered in urine and feces. Pt found to have UTI and met sepsis criteria with, lactic acidosis. Catatonic state being addressed via ECT. PMH significant for: depression, anxiety, seizure, thyroid nodule, hypokalemia, malnutrition, paranoid psychosis with hallucinations. ?  ?PT Comments  ? ? Patient was alert, more interactive, and participated with mobility efforts. She does have limited activity tolerance and self limits progression of activity in standing. She is progressing with functional independence. Min guard to supervision provided for transfers this session. She demonstrated good sitting balance and was able to complete peri-care after urinating with supervision and no balance loss. Heart rate 105-102 bpm after return to bed. Anticipate patient will continue to make progress with functional  independence with consistent participation. Will update recommendations as appropriate based on functional status.  ?  ?Recommendations for follow up therapy are one component of a multi-disciplinary discharge planning process, led by the attending physician.  Recommendations may be updated based on patient status, additional functional criteria and insurance authorization. ? ?Follow Up Recommendations ? Skilled nursing-short term rehab (<3 hours/day) ?  ?  ?Assistance Recommended at Discharge Intermittent Supervision/Assistance  ?Patient can return home with the following A little help with walking and/or transfers;A little help with bathing/dressing/bathroom;Assistance with cooking/housework;Direct supervision/assist for financial management;Assist for transportation ?  ?Equipment Recommendations ? None recommended by PT  ?  ?Recommendations for Other Services   ? ? ?   ?Precautions / Restrictions Precautions ?Precautions: Fall ?Restrictions ?Weight Bearing Restrictions: No  ?  ? ?Mobility ? Bed Mobility ?Overal bed mobility: Needs Assistance ?Bed Mobility: Supine to Sit, Sit to Supine ?  ?  ?Supine to sit: Supervision (HOB flat) ?Sit to supine: Modified independent (Device/Increase time) ?  ?General bed mobility comments: increased time required to complete tasks ?  ? ?Transfers ?Overall transfer level: Needs assistance ?  ?Transfers: Bed to chair/wheelchair/BSC ?  ?  ?Step pivot transfers: Min guard, Supervision ?  ?  ?  ?General transfer comment: occasional cues for safety. patient transferred from bed to bed side commode and back to bed. progressing to supervision with return back to bed. ?  ? ?Ambulation/Gait ?  ?  ?  ?  ?  ?  ?  ?General Gait Details: patient declined further standing activity ? ? ?Stairs ?  ?  ?  ?  ?  ? ? ?Wheelchair Mobility ?  ? ?Modified Rankin (Stroke Patients Only) ?  ? ? ?  ?Balance   ?Sitting-balance support: Feet supported ?Sitting balance-Leahy Scale: Good ?Sitting balance - Comments: with toileting/peri-care and leaning or reaching outside base of support. supervision provided for safety ?  ?  ?Standing balance-Leahy Scale: Fair ?Standing balance comment: with unilateral UE support, hand held assistance ?  ?  ?  ?  ?  ?  ?  ?  ?  ?  ?  ?  ? ?  ?Cognition Arousal/Alertness: Awake/alert ?Behavior During Therapy: Flat affect ?Overall Cognitive Status: History of cognitive impairments - at baseline ?  ?  ?  ?  ?  ?  ?  ?  ?  ?  ?  ?  ?  ?  ?  ?  ?General Comments: patient is more alert and interactive than previous session. she still needs encouragement but participated and following one  step commands without difficulty ?  ?  ? ?  ?Exercises Other Exercises ?Other Exercises: offered LE exercises in bed for strengthening, patient declined ? ?  ?General Comments   ?  ?  ? ?Pertinent Vitals/Pain Pain Assessment ?Pain Assessment: No/denies pain   ? ? ?Home Living   ?  ?  ?  ?  ?  ?  ?  ?  ?  ?   ?  ?Prior Function    ?  ?  ?   ? ?PT Goals (current goals can now be found in the care plan section) Acute Rehab PT Goals ?Patient Stated Goal: none stated ?PT Goal Formulation: With patient ?Progress towards PT goals: Progressing toward goals ? ?  ?Frequency ? ? ? Min 2X/week ? ? ? ?  ?PT Plan Current plan remains appropriate  ? ? ?Co-evaluation   ?  ?  ?  ?  ? ?  ?AM-PAC PT "6 Clicks" Mobility   ?Outcome Measure ? Help needed turning from your back to your side while in a flat bed without using bedrails?: A Little ?Help needed moving from lying on your back to sitting on the side of a flat bed without using bedrails?: A Little ?Help needed moving to and from a bed to a chair (including a wheelchair)?: A Little ?Help needed standing up from a chair using your arms (e.g., wheelchair or bedside chair)?: A Little ?Help needed to walk in hospital room?: A Lot ?Help needed climbing 3-5 steps with a railing? : A Lot ?6 Click Score: 16 ? ?  ?End of Session   ?Activity Tolerance: Patient tolerated treatment well ?Patient left: in bed;with call bell/phone within reach;with bed alarm set ?Nurse Communication: Mobility status ?PT Visit Diagnosis: Unsteadiness on feet (R26.81);Muscle weakness (generalized) (M62.81) ?  ? ? ?Time: 4834-7583 ?PT Time Calculation (min) (ACUTE ONLY): 10 min ? ?Charges:  $Therapeutic Activity: 8-22 mins          ?          ? ?Minna Merritts, PT, MPT ? ? ? ?[name] ?10/21/2021, 2:50 PM ? ?

## 2021-10-22 ENCOUNTER — Inpatient Hospital Stay (HOSPITAL_COMMUNITY)
Admit: 2021-10-22 | Discharge: 2021-10-22 | Disposition: A | Discharge status: Home / Self Care | Payer: Medicare Other | Attending: Internal Medicine | Admitting: Internal Medicine

## 2021-10-22 ENCOUNTER — Inpatient Hospital Stay: Payer: Medicare Other | Admitting: Certified Registered Nurse Anesthetist

## 2021-10-22 DIAGNOSIS — F333 Major depressive disorder, recurrent, severe with psychotic symptoms: Secondary | ICD-10-CM | POA: Diagnosis not present

## 2021-10-22 DIAGNOSIS — E876 Hypokalemia: Secondary | ICD-10-CM | POA: Diagnosis not present

## 2021-10-22 DIAGNOSIS — F061 Catatonic disorder due to known physiological condition: Secondary | ICD-10-CM | POA: Diagnosis not present

## 2021-10-22 DIAGNOSIS — R627 Adult failure to thrive: Secondary | ICD-10-CM | POA: Diagnosis not present

## 2021-10-22 DIAGNOSIS — N390 Urinary tract infection, site not specified: Secondary | ICD-10-CM | POA: Diagnosis not present

## 2021-10-22 DIAGNOSIS — M1990 Unspecified osteoarthritis, unspecified site: Secondary | ICD-10-CM | POA: Diagnosis not present

## 2021-10-22 DIAGNOSIS — F331 Major depressive disorder, recurrent, moderate: Secondary | ICD-10-CM | POA: Diagnosis not present

## 2021-10-22 LAB — BASIC METABOLIC PANEL
Anion gap: 7 (ref 5–15)
BUN: 21 mg/dL — ABNORMAL HIGH (ref 6–20)
CO2: 24 mmol/L (ref 22–32)
Calcium: 9 mg/dL (ref 8.9–10.3)
Chloride: 109 mmol/L (ref 98–111)
Creatinine, Ser: 0.72 mg/dL (ref 0.44–1.00)
GFR, Estimated: >60 mL/min (ref >60)
Glucose, Bld: 105 mg/dL — ABNORMAL HIGH (ref 70–99)
Potassium: 3.6 mmol/L (ref 3.5–5.1)
Sodium: 140 mmol/L (ref 135–145)

## 2021-10-22 LAB — ALBUMIN: Albumin: 3.1 g/dL — ABNORMAL LOW (ref 3.5–5.0)

## 2021-10-22 MED ORDER — SUCCINYLCHOLINE CHLORIDE 200 MG/10ML IV SOSY
PREFILLED_SYRINGE | INTRAVENOUS | Status: AC
  Filled 2021-10-22: qty 10

## 2021-10-22 MED ORDER — MIDAZOLAM HCL 2 MG/2ML IJ SOLN
INTRAMUSCULAR | Status: DC | PRN
  Administered 2021-10-22: 2 mg via INTRAVENOUS

## 2021-10-22 MED ORDER — MIDAZOLAM HCL 2 MG/2ML IJ SOLN: 2.0000 mg | Freq: Once | INTRAMUSCULAR | Status: DC

## 2021-10-22 MED ORDER — LABETALOL HCL 5 MG/ML IV SOLN
INTRAVENOUS | Status: DC | PRN
  Administered 2021-10-22: 5 mg via INTRAVENOUS

## 2021-10-22 MED ORDER — METHOHEXITAL SODIUM 100 MG/10ML IV SOSY
PREFILLED_SYRINGE | INTRAVENOUS | Status: DC | PRN
  Administered 2021-10-22: 60 mg via INTRAVENOUS

## 2021-10-22 MED ORDER — SUCCINYLCHOLINE CHLORIDE 200 MG/10ML IV SOSY
PREFILLED_SYRINGE | INTRAVENOUS | Status: DC | PRN
  Administered 2021-10-22: 60 mg via INTRAVENOUS

## 2021-10-22 MED ORDER — SODIUM CHLORIDE 0.9 % IV SOLN: 500.0000 mL | Freq: Once | INTRAVENOUS | Status: DC

## 2021-10-22 MED ORDER — LABETALOL HCL 5 MG/ML IV SOLN
INTRAVENOUS | Status: AC
  Filled 2021-10-22: qty 4

## 2021-10-22 MED ORDER — POTASSIUM CHLORIDE CRYS ER 20 MEQ PO TBCR
40.0000 meq | EXTENDED_RELEASE_TABLET | Freq: Once | ORAL | Status: AC
  Administered 2021-10-22: 40 meq via ORAL
  Filled 2021-10-22: qty 2

## 2021-10-22 MED ORDER — METHOHEXITAL SODIUM 0.5 G IJ SOLR
INTRAMUSCULAR | Status: AC
  Filled 2021-10-22: qty 500

## 2021-10-22 MED ORDER — MIDAZOLAM HCL 2 MG/2ML IJ SOLN
INTRAMUSCULAR | Status: AC
  Filled 2021-10-22: qty 2

## 2021-10-22 NOTE — H&P (Signed)
Karen [name] is an 46 y.o. female.   ?Chief Complaint: Patient has no specific complaint and is reporting feeling better.  Much more energetic.  Eating better.  Told me this afternoon after treatment that she was eating a lot. ?HPI: History of what sounds like a severe depression with psychosis worsening over months ? ?Past Medical History:  ?Diagnosis Date  ? Ehlers-Danlos syndrome   ? Fibromyalgia   ? Headache   ? Sleep apnea   ? Tics of organic origin   ? Transient alteration of awareness   ? ? ?Past Surgical History:  ?Procedure Laterality Date  ? COLONOSCOPY WITH PROPOFOL N/A 02/13/2018  ? Procedure: COLONOSCOPY WITH PROPOFOL;  Surgeon: [name], MD;  Location: [facility] ENDOSCOPY;  Service: Endoscopy;  Laterality: N/A;  ? [place] OF UTERUS  2009  ? ESOPHAGOGASTRODUODENOSCOPY (EGD) WITH PROPOFOL  02/13/2018  ? Procedure: ESOPHAGOGASTRODUODENOSCOPY (EGD) WITH PROPOFOL;  Surgeon: [name], MD;  Location: [facility] ENDOSCOPY;  Service: Endoscopy;;  ? EYE SURGERY Left 1990  ? 3 Surgeries on left eye to correct crossed eye  ? LAPAROSCOPY Left 2003  ? Fallopian Tube  ? Lead  ? OTHER SURGICAL HISTORY Right 1987  ? 3 Surgeries to repair broken arm  ? OTHER SURGICAL HISTORY Left   ? 3 surgeries on her left thigh as an infant  ? OTHER SURGICAL HISTORY  1998  ? Jaw surgery  ? Right arm surgery  1987  ? x 3 due to fracture  ? TOENAIL EXCISION Bilateral 06/13/2019  ? Procedure: BILATERAL SECOND TOE PARTIAL NAIL ABLATION;  Surgeon: Erle Crocker, MD;  Location: [facility];  Service: Orthopedics;  Laterality: Bilateral;  SURGERY REQUEST TIME 1 HOUR  ? TONSILLECTOMY  12/13/2002  ? ? ?Family History  ?Problem Relation Age of Onset  ? Depression Mother   ? Stroke Mother   ? Fibromyalgia Mother   ? Osteoarthritis Mother   ? Asthma Brother   ? Depression Brother   ? Migraines Brother   ? Asperger's syndrome Brother   ? Other Brother   ?     Ehlers-Danlos Syndrome  ? Cancer  Maternal Aunt   ? Asperger's syndrome Maternal Aunt   ? Breast cancer Maternal Aunt   ? Heart disease Paternal Aunt   ? Heart disease Paternal Uncle   ? Cervical cancer Maternal Grandmother   ? Osteoporosis Maternal Grandmother   ?     Died at 5  ? Osteoarthritis Maternal Grandmother   ? Colon cancer Maternal Grandfather   ? Asthma Maternal Grandfather   ? Asperger's syndrome Maternal Grandfather   ? Emphysema Maternal Grandfather   ?     Died at 39  ? Prostate cancer Maternal Grandfather   ? Heart attack Paternal Grandfather   ?     Died at 52  ? Asthma Paternal Grandfather   ? Tuberculosis Paternal Grandfather   ? Cancer Cousin   ? Asperger's syndrome Cousin   ? Asperger's syndrome Other   ? Heart Problems Paternal Grandmother   ?     Died at 70  ? Ehlers-Danlos syndrome Father   ? Ehlers-Danlos syndrome Brother   ? ?Social History:  reports that she has never smoked. She has never used smokeless tobacco. She reports that she does not drink alcohol and does not use drugs. ? ?Allergies:  ?Allergies  ?Allergen Reactions  ? Augmentin  [Amoxicillin-Pot Clavulanate] Diarrhea  ? Chocolate Flavor   ?  Other reaction(s):  Other (See Comments) ?Wheezing, acne  ? Demerol [Meperidine] Other (See Comments)  ?  Slow to wake up when this drug is given.   ? Keflex [Cephalexin] Nausea And Vomiting  ? Morphine And Related Nausea And Vomiting  ? Tape Dermatitis  ?  Must use paper tape only  ? Toradol [Ketorolac Tromethamine] Nausea And Vomiting  ? Amoxicillin   ?  Other reaction(s): Unknown  ? Chocolate   ?  GI distress  ? Nsaids   ?  patient develops ulcers ?Other reaction(s): Other (See Comments) ?patient develops ulcers  ? Strawberry Extract   ?  GI distress  ? ? ?(Not in a hospital admission) ? ? ?Results for orders placed or performed during the hospital encounter of 10/04/21 (from the past 48 hour(s))  ?Basic metabolic panel     Status: Abnormal  ? Collection Time: 10/21/21  4:13 AM  ?Result Value Ref Range  ? Sodium 144 135  - 145 mmol/L  ? Potassium 3.1 (L) 3.5 - 5.1 mmol/L  ? Chloride 110 98 - 111 mmol/L  ? CO2 24 22 - 32 mmol/L  ? Glucose, Bld 107 (H) 70 - 99 mg/dL  ?  Comment: Glucose reference range applies only to samples taken after fasting for at least 8 hours.  ? BUN 24 (H) 6 - 20 mg/dL  ? Creatinine, Ser 0.69 0.44 - 1.00 mg/dL  ? Calcium 8.5 (L) 8.9 - 10.3 mg/dL  ? GFR, Estimated >60 >60 mL/min  ?  Comment: (NOTE) ?Calculated using the CKD-EPI Creatinine Equation (2021) ?  ? Anion gap 10 5 - 15  ?  Comment: Performed at [facility], [address] [place], [place] [address]  ?Magnesium     Status: None  ? Collection Time: 10/21/21  4:13 AM  ?Result Value Ref Range  ? Magnesium 2.0 1.7 - 2.4 mg/dL  ?  Comment: Performed at [facility], [address], [place], [place] [address]  ?Phosphorus     Status: None  ? Collection Time: 10/21/21  4:13 AM  ?Result Value Ref Range  ? Phosphorus 3.7 2.5 - 4.6 mg/dL  ?  Comment: Performed at [facility], [address] [place], [place] [address]  ?Basic metabolic panel     Status: Abnormal  ? Collection Time: 10/22/21  5:07 AM  ?Result Value Ref Range  ? Sodium 140 135 - 145 mmol/L  ? Potassium 3.6 3.5 - 5.1 mmol/L  ? Chloride 109 98 - 111 mmol/L  ? CO2 24 22 - 32 mmol/L  ? Glucose, Bld 105 (H) 70 - 99 mg/dL  ?  Comment: Glucose reference range applies only to samples taken after fasting for at least 8 hours.  ? BUN 21 (H) 6 - 20 mg/dL  ? Creatinine, Ser 0.72 0.44 - 1.00 mg/dL  ? Calcium 9.0 8.9 - 10.3 mg/dL  ? GFR, Estimated >60 >60 mL/min  ?  Comment: (NOTE) ?Calculated using the CKD-EPI Creatinine Equation (2021) ?  ? Anion gap 7 5 - 15  ?  Comment: Performed at [facility], [address] [place],  [address]  ?Albumin     Status: Abnormal  ? Collection Time: 10/22/21  5:07 AM  ?Result Value Ref Range  ? Albumin 3.1 (L) 3.5 - 5.0 g/dL  ?  Comment: Performed at [facility], [address], [place],  [address]  ? ?No  results found. ? ?Review of Systems  ?Constitutional: Negative.   ?HENT: Negative.    ?  Eyes: Negative.   ?Respiratory: Negative.    ?Cardiovascular: Negative.   ?Gastrointestinal: Negative.   ?Musculoskeletal: Negative.   ?Skin: Negative.   ?Neurological: Negative.   ?Psychiatric/Behavioral: Negative.    ? ?Blood pressure 115/76, pulse 81, temperature 97.8 ?F (36.6 ?C), resp. rate 19, height '5\' 3"'$  (1.6 m), weight 60.9 kg, SpO2 99 %. ?Physical Exam ?Vitals and nursing note reviewed.  ?Constitutional:   ?   Appearance: She is well-developed.  ?HENT:  ?   Head: Normocephalic and atraumatic.  ?Eyes:  ?   Conjunctiva/sclera: Conjunctivae normal.  ?   Pupils: Pupils are equal, round, and reactive to light.  ?Cardiovascular:  ?   Heart sounds: Normal heart sounds.  ?Pulmonary:  ?   Effort: Pulmonary effort is normal.  ?Abdominal:  ?   Palpations: Abdomen is soft.  ?Musculoskeletal:     ?   General: Normal range of motion.  ?   Cervical back: Normal range of motion.  ?Skin: ?   General: Skin is warm and dry.  ?Neurological:  ?   General: No focal deficit present.  ?   Mental Status: She is alert.  ?Psychiatric:     ?   Mood and Affect: Mood normal.     ?   Behavior: Behavior normal.  ?  ? ?Assessment/Plan ?ECT course proving effective.  Patient's appetite has improved dramatically as has her mood and overall behavior pattern.  Plan to continue treatment while we continue to see incremental improvement. ? ?Alethia Berthold, MD ?10/22/2021, 7:17 PM ? ? ? ?

## 2021-10-22 NOTE — Anesthesia Procedure Notes (Signed)
Date/Time: 10/22/2021 11:28 AM ?Performed by: Demetrius Charity, CRNA ?Pre-anesthesia Checklist: Patient identified, Patient being monitored, Timeout performed, Emergency Drugs available and Suction available ?Patient Re-evaluated:Patient Re-evaluated prior to induction ?Oxygen Delivery Method: Circle system utilized ?Preoxygenation: Pre-oxygenation with 100% oxygen ?Ventilation: Mask ventilation without difficulty ?Airway Equipment and Method: Bite block ?Dental Injury: Teeth and Oropharynx as per pre-operative assessment  ? ? ? ? ?

## 2021-10-22 NOTE — Transfer of Care (Signed)
Immediate Anesthesia Transfer of Care Note ? ?Patient: Karen [name] ? ?Procedure(s) Performed: ECT TX ? ?Patient Location: PACU ? ?Anesthesia Type:General ? ?Level of Consciousness: drowsy ? ?Airway & Oxygen Therapy: Patient Spontanous Breathing and Patient connected to face mask oxygen ? ?Post-op Assessment: Report given to RN and Post -op Vital signs reviewed and stable ? ?Post vital signs: Reviewed and stable ? ?Last Vitals:  ?Vitals Value Taken Time  ?BP 118/81   ?Temp    ?Pulse 86 10/22/21 1141  ?Resp    ?SpO2 99 % 10/22/21 1141  ?Vitals shown include unvalidated device data. ? ?Last Pain:  ?Vitals:  ? 10/22/21 1118  ?TempSrc: Tympanic  ?PainSc: 0-No pain  ?   ? ?  ? ?Complications: No notable events documented. ?

## 2021-10-22 NOTE — Procedures (Signed)
ECT SERVICES ?Physician?s Interval Evaluation & Treatment Note ? ?Patient Identification: Karen [name] ?MRN:  [id number] ?Date of Evaluation:  10/22/2021 ?[place] #: 6 ? ?MADRS:  ? ?MMSE:  ? ?P.E. Findings: ? ?Stronger more robust more upbeat.  Moving with more skill. ? ?Psychiatric Interval Note: ?Mood is much improved.  Smiling upbeat reactive ? ?Subjective: ? ?Patient is a 46 y.o. female seen for evaluation for Electroconvulsive Therapy. ?Patient acknowledges feeling better ? ?Treatment Summary: ? ? '[]'$   Right Unilateral             '[x]'$  Bilateral ?  ?% Energy : 1.0 ms 40% ? ? Impedance: 1930 ohms ? ?Seizure Energy Index: 8891 ?V squared ? ?Postictal Suppression Index: 68% ? ?Seizure Concordance Index: 94% ? ?Medications ? ?Pre Shock: Brevital 60 mg succinylcholine 60 mg ? ?Post Shock: Versed 2 mg ? ?Seizure Duration: EMG 17 seconds EEG 55 seconds ? ? ?Comments: ?Patient is showing incremental improvement and will be continued on treatment as long as she is getting more benefit. ? ?Lungs: ? ?'[x]'$   Clear to auscultation              ? ?'[]'$  Other:  ? ?Heart: ?  ? '[x]'$   Regular rhythm             '[]'$  irregular rhythm ? ? ? '[x]'$   Previous H&P reviewed, patient examined and there are NO CHANGES              ?  ? '[]'$   Previous H&P reviewed, patient examined and there are changes noted. ? ? ?Alethia Berthold, MD ?3/10/20237:18 PM ? ?  ?

## 2021-10-22 NOTE — Progress Notes (Signed)
?PROGRESS NOTE ? ?Karen [name]    DOB: 09-23-1975, 46 y.o.  ?KZS:[id number]  ?  Code Status: Full Code   ?DOA: 10/04/2021   LOS: 17  ? ?Brief hospital course  ?Karen [name] is a 46 y.o. female with a PMH significant for Erler's Danlos syndrome, fibromyalgia, sleep apnea, transient alteration of awareness, tics of organic origin, headaches. ?They presented from home to the ED on 10/04/2021 with found down x several days days.  She was found lying on the floor covered with urine and feces ?In the ED, it was found that they had UTI and met sepsis criteria with, lactic acidosis.  ?They were treated with ceftriaxone and IV fluids.  ?Patient was admitted to medicine service for further workup and management of AMS as outlined in detail below. ? ?10/22/21 -stable ? ?Assessment & Plan  ?Principal Problem: ?  Catatonia ?Active Problems: ?  Depression with anxiety ?  Seizure disorder ([place]) ?  Sepsis secondary to UTI [facility] ?  AMS (altered mental status) ?  Thyroid nodule ?  Hypokalemia ?  Elevated troponin ?  Depression, major, recurrent, severe with psychosis ([place]) ?  Malnutrition of moderate degree ?  Encounter for nasogastric (NG) tube placement ?  Urinary tract infection without hematuria ? ?History of paranoid psychosis with hallucinations and delusions/catatonia ?Severe major depression and anxiety-patient presented in catatonic state and is showing improvement after ECT. Today, patient is alert and conversant appropriately. Seems that she continues to have longer periods of alertness progressing with more treatments.  ?Prior attending spoke with patient's mother who indicated that she will not be able to provide the level of care that her daughter requires at this time. If/when she becomes independent, she can live with her mom again but will need discharge to SNF vs inpatient psych unit at discharge pending how she progresses with ECT, medications, therapy. ?-Psychiatry following, appreciate  recommendations ? -ECT per psychiatry- ? -Continue Zyprexa increased to 82m nightly ?- PT/OT ? ?Hypokalemia -K 3.6, normalized this morning.  Monitor replace as needed ? ?Sepsis-sepsis physiology has resolved.  S/p CTX for UTI ? ?Elevated troponins on admission-likely from demand ischemia.  No cardiac history, unable to assess if she had chest pain, echo unremarkable. Cardiology evaluated ? ?Failure to thrive-poor nutrition status due to poor PO intake in sedated state and did not tolerate NG tube. Blood sugars have remained stable. Patient is NPO prior to ECT treatments. Optimistically she is eating more so will not need enteral feeds. As she is responding to treatment, will need to monitor closely for refeeding syndrome. ?Phosphorus, magnesium normal. Down approximately 40 pounds from her weight prior to her current illness.  ?- strict I/O ?- calorie count ?- nutrition consult ?-Encourage p.o. intake ?- discontinued IV fluids as she has increased PO intake.  ? ?Thyroid nodule- thyroid ultrasound Diffusely heterogeneous and enlarged thyroid gland most consistent with diffuse goitrous change. ? - Recommend follow-up ultrasound in 1 year ? ?Body mass index is 23.78 kg/m?. ? ?VTE ppx: enoxaparin (LOVENOX) injection 40 mg Start: 10/11/21 2200 ?SCDs Start: 10/04/21 2355 ? ?Diet:  ?   ?Diet  ? Diet NPO time specified  ? ?Subjective 10/22/21   ? ?Pt was sleeping in bed but woke easily to voice today.  She was minimally interactive but did answer my questions.  She denied pain, headaches, cough, shortness of breath, fevers or chills, nausea vomiting.  Answered no when asked if anything bothering her. ?  ?Objective  ? ?Vitals:  ?  10/21/21 1701 10/21/21 1945 10/22/21 0549 10/22/21 0819  ?BP: 112/81 102/72 101/68 106/70  ?Pulse: (!) 103 95 82 86  ?Resp: _0 ?Temp: 98.8 ?F (37.1 ?C) 98 ?F (36.7 ?C) 98.2 ?F (36.8 ?C) 98 ?F (36.7 ?C)  ?TempSrc: Oral     ?SpO2: 99% 99% 99% 98%  ?Weight:      ?Height:      ? ?No intake  or output data in the 24 hours ending 10/22/21 1112 ? ?Filed Weights  ? 10/04/21 1900 10/17/21 0500  ?Weight: 60.8 kg 60.9 kg  ?  ? ?Physical Exam:  ?General: Sleeping comfortably but wakes to voice, no acute distress ?Respiratory: normal respiratory effort, CTA B with no wheezes or rhonchi. ?Cardiovascular: normal S1/S2, RRR, no peripheral edema ?Nervous: Normal speech, grossly nonfocal exam ?Skin: dry, intact, normal temperature ?Psych: Normal mood, flat affect, minimally interactive but does respond to questions ? ?Labs & data reviewed  ? ? ?Labs notable for glucose 105, BUN 21, albumin 3.1.  Potassium level normalized ? ? ? ?Disposition Plan & Communication  ?Patient status: Inpatient  ?Admitted From: Home ?Planned disposition location: SNF vs inpatient psych ? ?Anticipated discharge date: TBD pending continuing ECT therapy, defer to psychiatry ? ?Family Communication: None at bedside.  No new medical updates at this time. ?  ?Author: ?Ezekiel Slocumb, DO ?[facility] ?10/22/2021, 11:12 AM  ? ? ? ?

## 2021-10-22 NOTE — Progress Notes (Signed)
PHARMACY CONSULT NOTE ? ?Pharmacy Consult for Electrolyte Monitoring and Replacement  ? ?Recent Labs: ?Potassium (mmol/L)  ?Date Value  ?10/22/2021 3.6  ?02/19/2014 3.9  ? ?Magnesium (mg/dL)  ?Date Value  ?10/21/2021 2.0  ? ?Calcium (mg/dL)  ?Date Value  ?10/22/2021 9.0  ? ?Calcium, Total (mg/dL)  ?Date Value  ?02/19/2014 9.0  ? ?Albumin (g/dL)  ?Date Value  ?10/22/2021 3.1 (L)  ?10/15/2019 4.5  ?02/19/2014 3.6  ? ?Phosphorus (mg/dL)  ?Date Value  ?10/21/2021 3.7  ? ?Sodium (mmol/L)  ?Date Value  ?10/22/2021 140  ?10/15/2019 140  ?02/19/2014 140  ? ?Assessment: ?Pharmacy has been consulted to monitor and replace electrolytes in 46yo patient admitted with sepsis secondary to UTI. She is currently in a catatonic state and per psychiatry patient has been started on ECT. Patient with significant weight loss of more than 30 pounds. Dietician also following. ? ?Goal of Therapy:  ?Electrolytes within normal limits ? ?Plan:  ?K 3.6, improved with replacement yesterday. Will go ahead and order for Kcl 40 mEq PO x 1 today ?Will follow labs as ordered by primary team and/or periodically ?Pharmacy will continue to follow along ? ? ?Benita Gutter ?10/22/2021 ?7:24 AM ? ?

## 2021-10-22 NOTE — TOC Progression Note (Signed)
Transition of Care (TOC) - Progression Note  ? ? ?Patient Details  ?Name: Karen [name] ?MRN: [id number] ?Date of Birth: 13-Sep-1975 ? ?Transition of Care (TOC) CM/SW Contact  ?Pete Pelt, RN ?Phone Number: ?10/22/2021, 9:51 AM ? ?Clinical Narrative:   RNCM spoke to [place] via phone.  Mr. Drucie Ip states [employer] continues to investigate Ms. [place] home situation and whether APS accepting guardianship is an option at this point.  He states that he will be in to see patient today and will communicate with [facility] and nursing staff.  TOC to follow. ? ? ? ?Expected Discharge Plan:  (Home with parents as caregivers) ?Barriers to Discharge: Continued Medical Work up ? ?Expected Discharge Plan and Services ?Expected Discharge Plan:  (Home with parents as caregivers) ?  ?Discharge Planning Services: CM Consult ?  ?Living arrangements for the past 2 months: [name] ?                ?  ?  ?  ?  ?  ?  ?  ?  ?  ?  ? ? ?Social Determinants of Health (SDOH) Interventions ?  ? ?Readmission Risk Interventions ?No flowsheet data found. ? ?

## 2021-10-22 NOTE — Anesthesia Preprocedure Evaluation (Addendum)
Anesthesia Evaluation  ?Patient identified by MRN, date of birth, ID band ?Patient confused ? ? ? ?Airway ?Mallampati: III ? ?TM Distance: <3 FB ? ? ? ? Dental ? ?(+) Teeth Intact ?  ?Pulmonary ?asthma , sleep apnea ,  ?  ? ?+ decreased breath sounds ? ? ? ? ? Cardiovascular ? ?Rhythm:Regular  ? ?  ?Neuro/Psych ? Headaches, Seizures -,  Anxiety Depression   ? GI/Hepatic ?negative GI ROS, Neg liver ROS,   ?Endo/Other  ? ? Renal/GU ?  ?negative genitourinary ?  ?Musculoskeletal ? ?(+) Arthritis , Fibromyalgia - ? Abdominal ?Normal abdominal exam  (+)   ?Peds ?negative pediatric ROS ?(+)  Hematology ?negative hematology ROS ?(+)   ?Anesthesia Other Findings ? ? Reproductive/Obstetrics ? ?  ? ? ? ? ? ? ? ? ? ? ? ? ? ?  ?  ? ? ? ? ? ? ? ?Anesthesia Physical ?Anesthesia Plan ? ?ASA: 3 ? ?Anesthesia Plan: General  ? ?Post-op Pain Management:   ? ?Induction: Intravenous ? ?PONV Risk Score and Plan:  ? ?Airway Management Planned: Natural Airway and Mask ? ?Additional Equipment:  ? ?Intra-op Plan:  ? ?Post-operative Plan:  ? ?Informed Consent: I have reviewed the patients History and Physical, chart, labs and discussed the procedure including the risks, benefits and alternatives for the proposed anesthesia with the patient or authorized representative who has indicated his/her understanding and acceptance.  ? ? ? ? ? ?Plan Discussed with: CRNA and Surgeon ? ?Anesthesia Plan Comments:   ? ? ? ? ? ? ?Anesthesia Quick Evaluation ? ?

## 2021-10-22 NOTE — Anesthesia Postprocedure Evaluation (Signed)
Anesthesia Post Note ? ?Patient: SONDOS WOLFMAN ? ?Procedure(s) Performed: ECT TX ? ?Patient location during evaluation: PACU ?Anesthesia Type: General ?Level of consciousness: sedated ?Pain management: satisfactory to patient ?Vital Signs Assessment: post-procedure vital signs reviewed and stable ?Respiratory status: spontaneous breathing and nonlabored ventilation ?Cardiovascular status: stable ?Anesthetic complications: no ? ? ?No notable events documented. ? ? ?Last Vitals:  ?Vitals:  ? 10/22/21 1200 10/22/21 1210  ?BP: 110/78 115/76  ?Pulse: 80 81  ?Resp: (!) 26 19  ?Temp:  36.6 ?C  ?SpO2: 98% 99%  ?  ?Last Pain:  ?Vitals:  ? 10/22/21 1210  ?TempSrc:   ?PainSc: 0-No pain  ? ? ?  ?  ?  ?  ?  ?  ? ?VAN STAVEREN,Ellawyn Wogan ? ? ? ? ?

## 2021-10-22 NOTE — Anesthesia Postprocedure Evaluation (Signed)
Anesthesia Post Note ? ?Patient: [name] ? ?Procedure(s) Performed: ECT TX ? ?Patient location during evaluation: PACU ?Anesthesia Type: General ?Level of consciousness: awake and alert ?Pain management: pain level controlled ?Vital Signs Assessment: post-procedure vital signs reviewed and stable ?Respiratory status: spontaneous breathing, nonlabored ventilation, respiratory function stable and patient connected to nasal cannula oxygen ?Cardiovascular status: blood pressure returned to baseline and stable ?Postop Assessment: no apparent nausea or vomiting ?Anesthetic complications: no ? ? ?No notable events documented. ? ? ?Last Vitals:  ?Vitals:  ? 10/20/21 1327 10/20/21 1333  ?BP: 100/72 100/73  ?Pulse: 84 85  ?Resp: 18 (!) 24  ?Temp: 36.4 ?C 36.4 ?C  ?SpO2: 100%   ?  ?Last Pain:  ?Vitals:  ? 10/20/21 1333  ?TempSrc: Temporal  ?PainSc:   ? ? ?  ?  ?  ?  ?  ?  ? ?Karen [name] ? ? ? ? ?

## 2021-10-23 DIAGNOSIS — E876 Hypokalemia: Secondary | ICD-10-CM | POA: Diagnosis not present

## 2021-10-23 DIAGNOSIS — N390 Urinary tract infection, site not specified: Secondary | ICD-10-CM | POA: Diagnosis not present

## 2021-10-23 DIAGNOSIS — F061 Catatonic disorder due to known physiological condition: Secondary | ICD-10-CM | POA: Diagnosis not present

## 2021-10-23 DIAGNOSIS — R627 Adult failure to thrive: Secondary | ICD-10-CM | POA: Diagnosis not present

## 2021-10-23 MED ORDER — HALOPERIDOL 2 MG PO TABS
2.0000 mg | ORAL_TABLET | Freq: Four times a day (QID) | ORAL | Status: DC | PRN
Start: 2021-10-23 — End: 2021-10-27
  Filled 2021-10-23: qty 1

## 2021-10-23 MED ORDER — HALOPERIDOL LACTATE 5 MG/ML IJ SOLN: 2.0000 mg | Freq: Four times a day (QID) | INTRAMUSCULAR | Status: DC | PRN

## 2021-10-23 NOTE — Plan of Care (Signed)
Patient is doing well and is no longer catatonic. She was diaphoretic when I went into room but stated that room was a bit too warm for her so temp was turned down. Skin on body appears more pale than facial skin. Complained of abdominal pain and having a lump in stomach. Upon assessment, found a nodule in her upper abdominal area between the bottom of her rib cage. She rated it an aching 8. Called provider to report the nodule and that there was no appropriate med on mar to treat #8 pain. Provider stated to give the tylenol for the pain-patient stated that it was effective on reassessment. Denied additional needs. ?Problem: Education: ?Goal: Knowledge of General Education information will improve ?Description: Including pain rating scale, medication(s)/side effects and non-pharmacologic comfort measures ?Outcome: Progressing ?  ?Problem: Health Behavior/Discharge Planning: ?Goal: Ability to manage health-related needs will improve ?Outcome: Progressing ?  ?Problem: Clinical Measurements: ?Goal: Ability to maintain clinical measurements within normal limits will improve ?Outcome: Progressing ?Goal: Will remain free from infection ?Outcome: Progressing ?Goal: Diagnostic test results will improve ?Outcome: Progressing ?Goal: Respiratory complications will improve ?Outcome: Progressing ?Goal: Cardiovascular complication will be avoided ?Outcome: Progressing ?  ?Problem: Activity: ?Goal: Risk for activity intolerance will decrease ?Outcome: Progressing ?  ?Problem: Nutrition: ?Goal: Adequate nutrition will be maintained ?Outcome: Progressing ?  ?Problem: Coping: ?Goal: Level of anxiety will decrease ?Outcome: Progressing ?  ?Problem: Elimination: ?Goal: Will not experience complications related to bowel motility ?Outcome: Progressing ?Goal: Will not experience complications related to urinary retention ?Outcome: Progressing ?  ?Problem: Pain Managment: ?Goal: General experience of comfort will improve ?Outcome:  Progressing ?  ?Problem: Safety: ?Goal: Ability to remain free from injury will improve ?Outcome: Progressing ?  ?Problem: Skin Integrity: ?Goal: Risk for impaired skin integrity will decrease ?Outcome: Progressing ?  ?Problem: Education: ?Goal: Knowledge of disease or condition will improve ?Outcome: Progressing ?Goal: Knowledge of secondary prevention will improve (SELECT ALL) ?Outcome: Progressing ?Goal: Knowledge of patient specific risk factors will improve (INDIVIDUALIZE FOR PATIENT) ?Outcome: Progressing ?  ?

## 2021-10-23 NOTE — Progress Notes (Signed)
?PROGRESS NOTE ? ?Karen [name]    DOB: February 06, 1976, 46 y.o.  ?QPR:[id number]  ?  Code Status: Full Code   ?DOA: 10/04/2021   LOS: 18  ? ?Brief hospital course  ?Karen [name] is a 46 y.o. female with a PMH significant for Erler's Danlos syndrome, fibromyalgia, sleep apnea, transient alteration of awareness, tics of organic origin, headaches. ?They presented from home to the ED on 10/04/2021 with found down x several days days.  She was found lying on the floor covered with urine and feces ?In the ED, it was found that they had UTI and met sepsis criteria with, lactic acidosis.  ?They were treated with ceftriaxone and IV fluids.  ?Patient was admitted to medicine service for further workup and management of AMS as outlined in detail below. ? ?10/23/21 -medically stable.  Much more talkative and interactive today. ? ?Assessment & Plan  ?Principal Problem: ?  Catatonia ?Active Problems: ?  Depression with anxiety ?  Seizure disorder ([place]) ?  Sepsis secondary to UTI [facility]) ?  AMS (altered mental status) ?  Thyroid nodule ?  Hypokalemia ?  Elevated troponin ?  Depression, major, recurrent, severe with psychosis ([place]) ?  Malnutrition of moderate degree ?  Encounter for nasogastric (NG) tube placement ?  Urinary tract infection without hematuria ? ?History of paranoid psychosis with hallucinations and delusions/catatonia ?Severe major depression and anxiety-patient presented in catatonic state and is showing improvement after ECT. Today, patient is alert and conversant appropriately. Seems that she continues to have longer periods of alertness progressing with more treatments.  ?Prior attending spoke with patient's mother who indicated that she will not be able to provide the level of care that her daughter requires at this time. If/when she becomes independent, she can live with her mom again but will need discharge to SNF vs inpatient psych unit at discharge pending how she progresses with ECT, medications,  therapy. ?-Psychiatry following, appreciate recommendations ? -ECT per psychiatry- ? -Continue Zyprexa increased to 76m nightly ?- PT/OT ? ?Hypokalemia -K 3.6, normalized.  Monitor replace as needed ? ?Sepsis due to UTI-sepsis physiology has resolved.  Completed course of Rocephin for UTI. ? ?Elevated troponins on admission-likely from demand ischemia.  No cardiac history, unable to assess if she had chest pain, echo unremarkable. Cardiology evaluated ? ?Failure to thrive-poor nutrition status due to poor PO intake in sedated state and did not tolerate NG tube. Blood sugars have remained stable. Patient is NPO prior to ECT treatments. Optimistically she is eating more so will not need enteral feeds. As she is responding to treatment, will need to monitor closely for refeeding syndrome. ?Phosphorus, magnesium normal. Down approximately 40 pounds from her weight prior to her current illness.  ?- strict I/O ?- calorie count ?- nutrition consult ?-Encourage p.o. intake ?- discontinued IV fluids as she has increased PO intake.  ? ?Thyroid nodule- thyroid ultrasound Diffusely heterogeneous and enlarged thyroid gland most consistent with diffuse goitrous change. ? - Recommend follow-up ultrasound in 1 year ? ?Body mass index is 23.78 kg/m?. ? ?VTE ppx: enoxaparin (LOVENOX) injection 40 mg Start: 10/11/21 2200 ?SCDs Start: 10/04/21 2355 ? ?Diet:  ?   ?Diet  ? Diet regular Room service appropriate? Yes; Fluid consistency: Thin  ? ?Subjective 10/23/21   ? ?Pt was awake resting in bed when seen this morning.  She was quite talkative and much more interactive than any of my prior encounters with her.  She reports having some memory loss she feels  might be related to ECT.  Specifically mentions not remembering her siblings children.  She does report feeling much better and says she is eating a lot more. ?  ?Objective  ? ?Vitals:  ? 10/22/21 2044 10/23/21 0018 10/23/21 0529 10/23/21 0738  ?BP: 99/79 109/76 111/75 97/60  ?Pulse:  99 73 70 (!) 58  ?Resp: _0 ?Temp: 98.8 ?F (37.1 ?C) 98.1 ?F (36.7 ?C) 97.7 ?F (36.5 ?C) 97.9 ?F (36.6 ?C)  ?TempSrc: Oral Oral Oral Oral  ?SpO2: 99% 98% 100% 98%  ?Weight:      ?Height:      ? ? ?Intake/Output Summary (Last 24 hours) at 10/23/2021 1136 ?Last data filed at 10/23/2021 0945 ?Gross per 24 hour  ?Intake 483 ml  ?Output --  ?Net 483 ml  ? ? ?Filed Weights  ? 10/04/21 1900 10/17/21 0500  ?Weight: 60.8 kg 60.9 kg  ?  ? ?Physical Exam:  ?General: Awake and alert, no acute distress ?Respiratory: normal respiratory effort, on room air ?Cardiovascular: Regular rate and rhythm, no peripheral edema ?Nervous: Normal speech, grossly nonfocal exam ?Skin: dry, intact, normal temperature, no rashes seen ?Psych: Normal mood, flat affect, much more interactive, talkative and conversational today ? ?Labs & data reviewed  ? ? ?No new labs for review today ? ? ? ?Disposition Plan & Communication  ?Patient status: Inpatient  ?Admitted From: Home ?Planned disposition location: SNF vs home with home health.   ?Because patient is not currently independent with ADLs, unable to be admitted to [facility]. ? ?Anticipated discharge date: TBD pending continuing ECT therapy, defer to psychiatry ? ?Family Communication: None at bedside.  No new medical updates at this time. ?  ?Author: ?Ezekiel Slocumb, DO ?[facility] ?10/23/2021, 11:36 AM  ? ? ? ?

## 2021-10-23 NOTE — Progress Notes (Signed)
Physical Therapy Treatment ?Patient Details ?Name: Karen [name] ?MRN: [id number] ?DOB: 12/17/1975 ?Today's Date: 10/23/2021 ? ? ?History of Present Illness Pt admitted to [facility] on 10/04/21 after she was found down on the floor for several days, covered in urine and feces. Pt found to have UTI and met sepsis criteria with, lactic acidosis. Catatonic state being addressed via ECT. PMH significant for: depression, anxiety, seizure, thyroid nodule, hypokalemia, malnutrition, paranoid psychosis with hallucinations. ? ?  ?PT Comments  ? ? Pt was seen for management of transfers to stand and ROM to neck and shoulder due to her pain and tightness in shoulders and neck.  Pt is working within comfortable ROM and tolerance, but is worried about repetitive standing practice.  Pt is able to stand with min guard, and should be able to progress to gait if willing.  Per pt there is a "price to pay" for movement, with increased pain.  Will focus on tolerances for all therapy sessions, and encourage her to be up in chair with endurance on standing as well as permitted medically.   ?Recommendations for follow up therapy are one component of a multi-disciplinary discharge planning process, led by the attending physician.  Recommendations may be updated based on patient status, additional functional criteria and insurance authorization. ? ?Follow Up Recommendations ? Skilled nursing-short term rehab (<3 hours/day) ?  ?  ?Assistance Recommended at Discharge Intermittent Supervision/Assistance  ?Patient can return home with the following A little help with walking and/or transfers;A little help with bathing/dressing/bathroom;Assistance with cooking/housework;Direct supervision/assist for financial management;Assist for transportation ?  ?Equipment Recommendations ? None recommended by PT  ?  ?Recommendations for Other Services   ? ? ?  ?Precautions / Restrictions Precautions ?Precautions: Fall ?Precaution Comments: Aspiration;  seizure ?Restrictions ?Weight Bearing Restrictions: No  ?  ? ?Mobility ? Bed Mobility ?Overal bed mobility: Needs Assistance ?Bed Mobility: Supine to Sit, Sit to Supine ?  ?  ?Supine to sit: Min guard ?Sit to supine: Min guard ?  ?General bed mobility comments: min guard for safety and protection ?  ? ?Transfers ?Overall transfer level: Needs assistance ?Equipment used: Rolling walker (2 wheels), 1 person hand held assist ?Transfers: Sit to/from Stand ?Sit to Stand: Min guard ?  ?  ?  ?  ?  ?General transfer comment: min guard to stand repetitively at walker, pt is reporting she will hurt later if too much is doen ?  ? ?Ambulation/Gait ?  ?  ?  ?  ?  ?  ?  ?General Gait Details: declined ? ? ?Stairs ?  ?  ?  ?  ?  ? ? ?Wheelchair Mobility ?  ? ?Modified Rankin (Stroke Patients Only) ?  ? ? ?  ?Balance   ?  ?Sitting balance-Leahy Scale: Good ?  ?  ?  ?Standing balance-Leahy Scale: Fair ?  ?  ?  ?  ?  ?  ?  ?  ?  ?  ?  ?  ?  ? ?  ?Cognition Arousal/Alertness: Awake/alert ?Behavior During Therapy: Flat affect ?Overall Cognitive Status: History of cognitive impairments - at baseline ?  ?  ?  ?  ?  ?  ?  ?  ?  ?  ?  ?  ?  ?  ?  ?  ?General Comments: encouragement to manage her mobility within tolerance ?  ?  ? ?  ?Exercises General Exercises - Upper Extremity ?Shoulder Flexion: AAROM, 10 reps ?Shoulder Extension: AAROM, 10 reps ?Shoulder ABduction: AAROM,  10 reps ?Other Exercises ?Other Exercises: ROM to neck for comfort of mm tightness ? ?  ?General Comments General comments (skin integrity, edema, etc.): pt is actually fairly functional but self limits reporting she cannot do the exercises due to pain later on ?  ?  ? ?Pertinent Vitals/Pain Pain Assessment ?Pain Assessment: Faces ?Faces Pain Scale: Hurts little more ?Breathing: normal ?Negative Vocalization: none ?Facial Expression: smiling or inexpressive ?Body Language: relaxed ?Consolability: no need to console ?PAINAD Score: 0 ?Pain Location: everything but esp  neck and shoulders on traps  ? ? ?Home Living   ?  ?  ?  ?  ?  ?  ?  ?  ?  ?   ?  ?Prior Function    ?  ?  ?   ? ?PT Goals (current goals can now be found in the care plan section) Acute Rehab PT Goals ?Patient Stated Goal: none stated ?Progress towards PT goals: Progressing toward goals ? ?  ?Frequency ? ? ? Min 2X/week ? ? ? ?  ?PT Plan Current plan remains appropriate  ? ? ?Co-evaluation   ?  ?  ?  ?  ? ?  ?AM-PAC PT "6 Clicks" Mobility   ?Outcome Measure ? Help needed turning from your back to your side while in a flat bed without using bedrails?: A Little ?Help needed moving from lying on your back to sitting on the side of a flat bed without using bedrails?: A Little ?Help needed moving to and from a bed to a chair (including a wheelchair)?: A Little ?Help needed standing up from a chair using your arms (e.g., wheelchair or bedside chair)?: A Little ?Help needed to walk in hospital room?: A Lot ?Help needed climbing 3-5 steps with a railing? : A Lot ?6 Click Score: 16 ? ?  ?End of Session Equipment Utilized During Treatment: Gait belt ?Activity Tolerance: Patient tolerated treatment well ?Patient left: in bed;with call bell/phone within reach;with bed alarm set ?Nurse Communication: Mobility status ?PT Visit Diagnosis: Unsteadiness on feet (R26.81);Muscle weakness (generalized) (M62.81) ?  ? ? ?Time: 9628-3662 ?PT Time Calculation (min) (ACUTE ONLY): 24 min ? ?Charges:  $Therapeutic Exercise: 8-22 mins ?$Therapeutic Activity: 8-22 mins    ?Ramond Dial ?10/23/2021, 3:48 PM ? ?Mee Hives, PT PhD ?Acute Rehab Dept. Number: [facility] [phone] and [place] [phone] ? ? ?

## 2021-10-24 DIAGNOSIS — R627 Adult failure to thrive: Secondary | ICD-10-CM | POA: Diagnosis not present

## 2021-10-24 DIAGNOSIS — E876 Hypokalemia: Secondary | ICD-10-CM | POA: Diagnosis not present

## 2021-10-24 DIAGNOSIS — N390 Urinary tract infection, site not specified: Secondary | ICD-10-CM | POA: Diagnosis not present

## 2021-10-24 DIAGNOSIS — F061 Catatonic disorder due to known physiological condition: Secondary | ICD-10-CM | POA: Diagnosis not present

## 2021-10-24 NOTE — Progress Notes (Signed)
Physical Therapy Treatment ?Patient Details ?Name: Karen [name] ?MRN: [id number] ?DOB: [birth year] ?Today's Date: 10/24/2021 ? ? ?History of Present Illness Pt admitted to [facility] on 10/04/21 after she was found down on the floor for several days, covered in urine and feces. Pt found to have UTI and met sepsis criteria with, lactic acidosis. Catatonic state being addressed via ECT. PMH significant for: depression, anxiety, seizure, thyroid nodule, hypokalemia, malnutrition, paranoid psychosis with hallucinations. ? ?  ?PT Comments  ? ? Patient is making progress with functional independence and increased activity tolerance this session. She was able to ambulate a short distance in the room with rolling walker. After a short sitting rest break, she then stood for ~ 5 minutes to complete standing ADLs including washing her hands, brushing teeth, applying deodorant, and hand moisturizer with supervision and no loss of balance. Discharge recommendation has been updated based on current functional status. Recommend HHPT and intermittent family support at discharge. PT will continue to follow to maximize independence and decrease caregiver burden.  ?  ?Recommendations for follow up therapy are one component of a multi-disciplinary discharge planning process, led by the attending physician.  Recommendations may be updated based on patient status, additional functional criteria and insurance authorization. ? ?Follow Up Recommendations ? Home health PT ?  ?  ?Assistance Recommended at Discharge Intermittent Supervision/Assistance  ?Patient can return home with the following A little help with walking and/or transfers;A little help with bathing/dressing/bathroom;Assistance with cooking/housework;Direct supervision/assist for financial management;Assist for transportation ?  ?Equipment Recommendations ? None recommended by PT (DME has been delivered to the room today (BSC and RW))  ?  ?Recommendations for Other Services   ? ? ?   ?Precautions / Restrictions Precautions ?Precautions: Fall ?Restrictions ?Weight Bearing Restrictions: No  ?  ? ?Mobility ? Bed Mobility ?Overal bed mobility: Needs Assistance ?Bed Mobility: Supine to Sit, Sit to Supine ?  ?  ?Supine to sit: Modified independent (Device/Increase time), HOB elevated ?Sit to supine: Modified independent (Device/Increase time), HOB elevated ?  ?General bed mobility comments: increased time required to complete tasks. cues for task initiation ?  ? ?Transfers ?Overall transfer level: Needs assistance ?Equipment used: None ?Transfers: Sit to/from Stand ?Sit to Stand: Supervision, Min guard ?  ?Step pivot transfers: Min guard ?  ?  ?  ?General transfer comment: patient performed multiple transfers, multiple sit to stand transfers from bed and stand pivot transfer from bed to bed side commode. Min guard initially progressing to supervision. cues for safety ?  ? ?Ambulation/Gait ?Ambulation/Gait assistance: Min guard, Supervision ?Gait Distance (Feet): 5 Feet ?Assistive device: Rolling walker (2 wheels) ?Gait Pattern/deviations: Step-to pattern, Narrow base of support ?Gait velocity: decreased ?  ?  ?General Gait Details: DME was delivered to the room during session (rolling walker and bed side commode). adjusted walker to proper height and patient ambulated a short distance in the room with Min guard-supervision for safety. cues for use of rolling walker. she self limited progression of walking further due to fatigue, but was able to stand and perform additional activity after short seated rest break. Sp02 99% on room air and heart rate 105bpm after walking ? ? ?Stairs ?  ?  ?  ?  ?  ? ? ?Wheelchair Mobility ?  ? ?Modified Rankin (Stroke Patients Only) ?  ? ? ?  ?Balance   ?  ?  ?  ?  ?Standing balance support: No upper extremity supported, During functional activity ?Standing balance-Leahy Scale: Good ?Standing balance  comment: good standing balance. patient was able to reach outside base  of support while performing ADLs at sink with no trunk sway or loss of balance. supervision provided for safety ?  ?  ?  ?  ?  ?  ?  ?  ?  ?  ?  ?  ? ?  ?Cognition Arousal/Alertness: Awake/alert ?Behavior During Therapy: [facility] for tasks assessed/performed ?Overall Cognitive Status: History of cognitive impairments - at baseline ?  ?  ?  ?  ?  ?  ?  ?  ?  ?  ?  ?  ?  ?  ?  ?  ?General Comments: patient was alert, talkative, and agreeble to PT today. she was able to follow all directions without difficulty ?  ?  ? ?  ?Exercises   ? ?  ?General Comments General comments (skin integrity, edema, etc.): patient stood at the sink for ~ 5 minutes to perform standing activity including washing her hands, brushing her teeth, applying deoderant and hand moisturizer. she has no loss of balance with standing activity and was able to perform all ADLs with supervision. at the end of the session, patient was asking for [facility] Ensure drink and talking about ordering pancakes tomorrow morning for breakfast ?  ?  ? ?Pertinent Vitals/Pain Pain Assessment ?Pain Assessment: No/denies pain  ? ? ?Home Living   ?  ?  ?  ?  ?  ?  ?  ?  ?  ?   ?  ?Prior Function    ?  ?  ?   ? ?PT Goals (current goals can now be found in the care plan section) Acute Rehab PT Goals ?Patient Stated Goal: none stated ?PT Goal Formulation: With patient ?Progress towards PT goals: Progressing toward goals ? ?  ?Frequency ? ? ? Min 2X/week ? ? ? ?  ?PT Plan Discharge plan needs to be updated  ? ? ?Co-evaluation   ?  ?  ?  ?  ? ?  ?AM-PAC PT "6 Clicks" Mobility   ?Outcome Measure ? Help needed turning from your back to your side while in a flat bed without using bedrails?: A Little ?Help needed moving from lying on your back to sitting on the side of a flat bed without using bedrails?: A Little ?Help needed moving to and from a bed to a chair (including a wheelchair)?: A Little ?Help needed standing up from a chair using your arms (e.g., wheelchair or bedside  chair)?: A Little ?Help needed to walk in hospital room?: A Little ?Help needed climbing 3-5 steps with a railing? : A Lot ?6 Click Score: 17 ? ?  ?End of Session   ?Activity Tolerance: Patient tolerated treatment well ?Patient left: in bed;with call bell/phone within reach;with bed alarm set ?  ?PT Visit Diagnosis: Unsteadiness on feet (R26.81);Muscle weakness (generalized) (M62.81) ?  ? ? ?Time: 9417-4081 ?PT Time Calculation (min) (ACUTE ONLY): 31 min ? ?Charges:  $Gait Training: 8-22 mins ?$Therapeutic Activity: 8-22 mins          ?          ? ?Minna Merritts, PT, MPT ? ? ? ?[name] ?10/24/2021, 3:47 PM ? ?

## 2021-10-24 NOTE — Progress Notes (Signed)
?PROGRESS NOTE ? ?Karen [name]    DOB: [birth year], 46 y.o.  ?IFO:[id number]  ?  Code Status: Full Code   ?DOA: 10/04/2021   LOS: 19  ? ?Brief hospital course  ?Karen [name] is a 46 y.o. female with a PMH significant for Erler's Danlos syndrome, fibromyalgia, sleep apnea, transient alteration of awareness, tics of organic origin, headaches. ?They presented from home to the ED on 10/04/2021 with found down x several days days.  She was found lying on the floor covered with urine and feces ?In the ED, it was found that they had UTI and met sepsis criteria with, lactic acidosis.  ?They were treated with ceftriaxone and IV fluids.  ?Patient was admitted to medicine service for further workup and management of AMS as outlined in detail below. ? ?10/24/21 -medically stable.  Much more talkative and interactive today. ? ?Assessment & Plan  ?Principal Problem: ?  Catatonia ?Active Problems: ?  Depression with anxiety ?  Seizure disorder ([place]) ?  Sepsis secondary to UTI [facility]) ?  AMS (altered mental status) ?  Thyroid nodule ?  Hypokalemia ?  Elevated troponin ?  Depression, major, recurrent, severe with psychosis ([place]) ?  Malnutrition of moderate degree ?  Encounter for nasogastric (NG) tube placement ?  Urinary tract infection without hematuria ? ?History of paranoid psychosis with hallucinations and delusions/catatonia ?Severe major depression and anxiety-patient presented in catatonic state and is showing improvement after ECT. Today, patient is alert and conversant appropriately. Seems that she continues to have longer periods of alertness progressing with more treatments.  ?Prior attending spoke with patient's mother who indicated that she will not be able to provide the level of care that her daughter requires at this time. If/when she becomes independent, she can live with her mom again but will need discharge to SNF vs inpatient psych unit at discharge pending how she progresses with ECT, medications,  therapy. ?-Psychiatry following, appreciate recommendations ? -ECT per psychiatry-patient notably improving ? -Continue Zyprexa increased to 9m nightly ?- PT/OT ? ?Hypokalemia -K 3.6, normalized.  Monitor replace as needed ? ?Sepsis due to UTI-sepsis physiology has resolved.  Completed course of Rocephin for UTI. ? ?Elevated troponins on admission-likely from demand ischemia.  No cardiac history, unable to assess if she had chest pain, echo unremarkable. Cardiology evaluated ? ?Failure to thrive-poor nutrition status due to poor PO intake in sedated state and did not tolerate NG tube. Blood sugars have remained stable. Patient is NPO prior to ECT treatments. Optimistically she is eating more so will not need enteral feeds. As she is responding to treatment, will need to monitor closely for refeeding syndrome. ?Phosphorus, magnesium normal. Down approximately 40 pounds from her weight prior to her current illness.  ?- strict I/O ?- calorie count ?- nutrition consult ?-Encourage p.o. intake ?- discontinued IV fluids as she has increased PO intake.  ? ?Thyroid nodule- thyroid ultrasound Diffusely heterogeneous and enlarged thyroid gland most consistent with diffuse goitrous change. ? - Recommend follow-up ultrasound in 1 year ? ?Body mass index is 23.78 kg/m?. ? ?VTE ppx: enoxaparin (LOVENOX) injection 40 mg Start: 10/11/21 2200 ?SCDs Start: 10/04/21 2355 ? ?Diet:  ?   ?Diet  ? Diet regular Room service appropriate? Yes; Fluid consistency: Thin  ? ?Subjective 10/24/21   ? ?Pt awake eating her breakfast in bed when seen this morning.  She is again quite interactive and talkative during my encounter today.  Denies feeling sick, having pain or any other complaints.  We discussed continuing to work with therapy and maximize her time out of bed up in chair and progress with mobility is much as possible. ?  ?Objective  ? ?Vitals:  ? 10/23/21 0738 10/23/21 1539 10/23/21 2008 10/24/21 0519  ?BP: 97/60 112/80 104/82 105/72   ?Pulse: (!) 58 (!) 107 97 72  ?Resp: '16 16 18 19  ' ?Temp: 97.9 ?F (36.6 ?C) (!) 97.5 ?F (36.4 ?C) 97.9 ?F (36.6 ?C) 97.6 ?F (36.4 ?C)  ?TempSrc: Oral Oral Oral   ?SpO2: 98% 99% 99% 99%  ?Weight:      ?Height:      ? ? ?Intake/Output Summary (Last 24 hours) at 10/24/2021 1202 ?Last data filed at 10/23/2021 2300 ?Gross per 24 hour  ?Intake 1190 ml  ?Output 150 ml  ?Net 1040 ml  ? ? ?Filed Weights  ? 10/04/21 1900 10/17/21 0500  ?Weight: 60.8 kg 60.9 kg  ?  ? ?Physical Exam:  ?General: Awake and alert, no acute distress, eating breakfast, conversational ?Respiratory: normal respiratory effort, on room air ?Cardiovascular: Regular rate and rhythm, no peripheral edema ?Nervous: Normal speech, grossly nonfocal exam ?Skin: dry, intact, normal temperature, no rashes seen ?Psych: Normal mood, flat affect, continues to be more interactive and conversational ? ?Labs & data reviewed  ? ? ?No new labs for review today ? ? ? ?Disposition Plan & Communication  ?Patient status: Inpatient  ?Admitted From: Home ?Planned disposition location: SNF vs home with home health.   ?Because patient is not currently independent with ADLs, unable to be admitted to [facility]. ? ?Anticipated discharge date: TBD pending continuing ECT therapy, defer to psychiatry ? ?Family Communication: None at bedside.  No new medical updates at this time. ?  ?Author: ?Ezekiel Slocumb, DO ?[facility] ?10/24/2021, 12:02 PM  ? ? ? ?

## 2021-10-25 ENCOUNTER — Inpatient Hospital Stay (HOSPITAL_COMMUNITY)
Admit: 2021-10-25 | Discharge: 2021-10-25 | Disposition: A | Discharge status: Home / Self Care | Payer: Medicare Other | Attending: Internal Medicine | Admitting: Internal Medicine

## 2021-10-25 ENCOUNTER — Inpatient Hospital Stay: Payer: Medicare Other | Admitting: Certified Registered Nurse Anesthetist

## 2021-10-25 ENCOUNTER — Other Ambulatory Visit: Payer: Self-pay | Admitting: Psychiatry

## 2021-10-25 DIAGNOSIS — F333 Major depressive disorder, recurrent, severe with psychotic symptoms: Secondary | ICD-10-CM

## 2021-10-25 DIAGNOSIS — R627 Adult failure to thrive: Secondary | ICD-10-CM | POA: Diagnosis not present

## 2021-10-25 DIAGNOSIS — N390 Urinary tract infection, site not specified: Secondary | ICD-10-CM | POA: Diagnosis not present

## 2021-10-25 DIAGNOSIS — F061 Catatonic disorder due to known physiological condition: Secondary | ICD-10-CM | POA: Diagnosis not present

## 2021-10-25 DIAGNOSIS — F331 Major depressive disorder, recurrent, moderate: Secondary | ICD-10-CM | POA: Diagnosis not present

## 2021-10-25 DIAGNOSIS — G4739 Other sleep apnea: Secondary | ICD-10-CM | POA: Diagnosis not present

## 2021-10-25 DIAGNOSIS — E876 Hypokalemia: Secondary | ICD-10-CM | POA: Diagnosis not present

## 2021-10-25 MED ORDER — SUCCINYLCHOLINE CHLORIDE 200 MG/10ML IV SOSY
PREFILLED_SYRINGE | INTRAVENOUS | Status: DC | PRN
Start: 2021-10-25 — End: 2021-10-25
  Administered 2021-10-25: 60 mg via INTRAVENOUS

## 2021-10-25 MED ORDER — METHOHEXITAL SODIUM 100 MG/10ML IV SOSY
PREFILLED_SYRINGE | INTRAVENOUS | Status: DC | PRN
  Administered 2021-10-25: 60 mg via INTRAVENOUS

## 2021-10-25 NOTE — Anesthesia Procedure Notes (Signed)
Procedure Name: General with mask airway ?Date/Time: 10/25/2021 1:43 PM ?Performed by: Tollie Eth, CRNA ?Pre-anesthesia Checklist: Patient identified, Emergency Drugs available, Suction available and Patient being monitored ?Patient Re-evaluated:Patient Re-evaluated prior to induction ?Oxygen Delivery Method: Circle system utilized ?Preoxygenation: Pre-oxygenation with 100% oxygen ?Induction Type: IV induction ?Ventilation: Mask ventilation without difficulty and Mask ventilation throughout procedure ?Airway Equipment and Method: Bite block ?Placement Confirmation: positive ETCO2 ?Dental Injury: Teeth and Oropharynx as per pre-operative assessment  ? ? ? ? ?

## 2021-10-25 NOTE — TOC Progression Note (Addendum)
Transition of Care (TOC) - Progression Note  ? ? ?Patient Details  ?Name: Karen [name] ?MRN: [id number] ?Date of Birth: [birth year] ? ?Transition of Care (TOC) CM/SW Contact  ?Pete Pelt, RN ?Phone Number: ?10/25/2021, 3:10 PM ? ?Clinical Narrative:   APS worker Bynum Bellows will see patient and RNCM today.  PT recommends home health at this time, but mother states she cannot take care of patient and would like SNF.  RNCM will follow up with APS upon arrival.  TOC to follow. ? ? ?Addendum 1547 hours.  DSS worker Mr. Chestnut at bedside, attempting to see patient.  Mother also at bedside, states she will not allow Mr. Drucie Ip in room.  She states she will not allow DSS to see her daughter and states, "tell them [name], tell this man he is not allowed in the room."  Patient did not verbally respond.  DSS case worker stated he wanted to offer patient and mother assistance with care at home.  Mother still denied entry to Mr. Chestnut.  ? ?Mr Drucie Ip states he will discuss with his team and manager to determine next steps for disposition and communicate with RNCM.   ?Expected Discharge Plan:  (Home with parents as caregivers) ?Barriers to Discharge: Continued Medical Work up ? ?Expected Discharge Plan and Services ?Expected Discharge Plan:  (Home with parents as caregivers) ?  ?Discharge Planning Services: CM Consult ?  ?Living arrangements for the past 2 months: [place] ?                ?  ?  ?  ?  ?  ?  ?  ?  ?  ?  ? ? ?Social Determinants of Health (SDOH) Interventions ?  ? ?Readmission Risk Interventions ?No flowsheet data found. ? ?

## 2021-10-25 NOTE — H&P (Signed)
Karen [name] is an 46 y.o. female.   ?Chief Complaint: Chief complaint is only of some recent memory loss related to ECT ?HPI: Mood much improved nonagitated not apparently psychotic ? ?Past Medical History:  ?Diagnosis Date  ? Ehlers-Danlos syndrome   ? Fibromyalgia   ? Headache   ? Sleep apnea   ? Tics of organic origin   ? Transient alteration of awareness   ? ? ?Past Surgical History:  ?Procedure Laterality Date  ? COLONOSCOPY WITH PROPOFOL N/A 02/13/2018  ? Procedure: COLONOSCOPY WITH PROPOFOL;  Surgeon: [name], MD;  Location: [facility] ENDOSCOPY;  Service: Endoscopy;  Laterality: N/A;  ? [place] OF UTERUS  2009  ? ESOPHAGOGASTRODUODENOSCOPY (EGD) WITH PROPOFOL  02/13/2018  ? Procedure: ESOPHAGOGASTRODUODENOSCOPY (EGD) WITH PROPOFOL;  Surgeon: [name], MD;  Location: [facility] ENDOSCOPY;  Service: Endoscopy;;  ? EYE SURGERY Left 1990  ? 3 Surgeries on left eye to correct crossed eye  ? LAPAROSCOPY Left 2003  ? Fallopian Tube  ? [place]  ? OTHER SURGICAL HISTORY Right 1987  ? 3 Surgeries to repair broken arm  ? OTHER SURGICAL HISTORY Left   ? 3 surgeries on her left thigh as an infant  ? OTHER SURGICAL HISTORY  1998  ? Jaw surgery  ? Right arm surgery  1987  ? x 3 due to fracture  ? TOENAIL EXCISION Bilateral 06/13/2019  ? Procedure: BILATERAL SECOND TOE PARTIAL NAIL ABLATION;  Surgeon: Erle Crocker, MD;  Location: [facility];  Service: Orthopedics;  Laterality: Bilateral;  SURGERY REQUEST TIME 1 HOUR  ? TONSILLECTOMY  12/13/2002  ? ? ?Family History  ?Problem Relation Age of Onset  ? Depression Mother   ? Stroke Mother   ? Fibromyalgia Mother   ? Osteoarthritis Mother   ? Asthma Brother   ? Depression Brother   ? Migraines Brother   ? Asperger's syndrome Brother   ? Other Brother   ?     Ehlers-Danlos Syndrome  ? Cancer Maternal Aunt   ? Asperger's syndrome Maternal Aunt   ? Breast cancer Maternal Aunt   ? Heart disease Paternal Aunt   ? Heart disease  Paternal Uncle   ? Cervical cancer Maternal Grandmother   ? Osteoporosis Maternal Grandmother   ?     Died at 1  ? Osteoarthritis Maternal Grandmother   ? Colon cancer Maternal Grandfather   ? Asthma Maternal Grandfather   ? Asperger's syndrome Maternal Grandfather   ? Emphysema Maternal Grandfather   ?     Died at 37  ? Prostate cancer Maternal Grandfather   ? Heart attack Paternal Grandfather   ?     Died at 35  ? Asthma Paternal Grandfather   ? Tuberculosis Paternal Grandfather   ? Cancer Cousin   ? Asperger's syndrome Cousin   ? Asperger's syndrome Other   ? Heart Problems Paternal Grandmother   ?     Died at 101  ? Ehlers-Danlos syndrome Father   ? Ehlers-Danlos syndrome Brother   ? ?Social History:  reports that Karen [name] has never smoked. Karen [name] has never used smokeless tobacco. Karen [name] reports that Karen [name] does not drink alcohol and does not use drugs. ? ?Allergies:  ?Allergies  ?Allergen Reactions  ? Augmentin  [Amoxicillin-Pot Clavulanate] Diarrhea  ? Chocolate Flavor   ?  Other reaction(s): Other (See Comments) ?Wheezing, acne  ? Demerol [Meperidine] Other (See Comments)  ?  Slow to wake up when this drug is given.   ?  Keflex [Cephalexin] Nausea And Vomiting  ? Morphine And Related Nausea And Vomiting  ? Tape Dermatitis  ?  Must use paper tape only  ? Toradol [Ketorolac Tromethamine] Nausea And Vomiting  ? Amoxicillin   ?  Other reaction(s): Unknown  ? Chocolate   ?  GI distress  ? Nsaids   ?  patient develops ulcers ?Other reaction(s): Other (See Comments) ?patient develops ulcers  ? Strawberry Extract   ?  GI distress  ? ? ?(Not in a hospital admission) ? ? ?No results found for this or any previous visit (from the past 48 hour(s)). ?No results found. ? ?Review of Systems  ?Constitutional: Negative.   ?HENT: Negative.    ?Eyes: Negative.   ?Respiratory: Negative.    ?Cardiovascular: Negative.   ?Gastrointestinal: Negative.   ?Musculoskeletal: Negative.   ?Skin: Negative.   ?Neurological: Negative.    ?Psychiatric/Behavioral: Negative.    ? ?Blood pressure (!) 120/95, pulse 96, temperature (!) 97.1 ?F (36.2 ?C), resp. rate (!) 21, height '4\' 11"'$  (1.499 m), weight 60.9 kg, SpO2 96 %. ?Physical Exam ?Constitutional:   ?   Appearance: Karen [name] is well-developed.  ?HENT:  ?   Head: Normocephalic and atraumatic.  ?Eyes:  ?   Conjunctiva/sclera: Conjunctivae normal.  ?   Pupils: Pupils are equal, round, and reactive to light.  ?Cardiovascular:  ?   Heart sounds: Normal heart sounds.  ?Pulmonary:  ?   Effort: Pulmonary effort is normal.  ?Abdominal:  ?   Palpations: Abdomen is soft.  ?Musculoskeletal:     ?   General: Normal range of motion.  ?   Cervical back: Normal range of motion.  ?Skin: ?   General: Skin is warm and dry.  ?Neurological:  ?   General: No focal deficit present.  ?   Mental Status: Karen [name] is alert.  ?Psychiatric:     ?   Attention and Perception: Karen [name] is inattentive.     ?   Mood and Affect: Mood normal.     ?   Speech: Speech normal.     ?   Behavior: Behavior is cooperative.     ?   Cognition and Memory: Memory is impaired.  ?  ? ?Assessment/Plan ?I think we are probably going to stop this index course as of this treatment and recommend outpatient follow-up with mental health follow-up as well ? ?Alethia Berthold, MD ?10/25/2021, 6:23 PM ? ? ? ?

## 2021-10-25 NOTE — Procedures (Signed)
ECT SERVICES ?Physician?s Interval Evaluation & Treatment Note ? ?Patient Identification: Karen [name] ?MRN:  [id number] ?Date of Evaluation:  10/25/2021 ?[place] #: [phone] ? ?MADRS:  ? ?MMSE:  ? ?P.E. Findings: ? ?No change to physical exam ? ?Psychiatric Interval Note: ? ?Upbeat bright much more conversant ? ?Subjective: ? ?Patient is a 46 y.o. female seen for evaluation for Electroconvulsive Therapy. ?No specific complaint ? ?Treatment Summary: ? ? '[]'$   Right Unilateral             '[x]'$  Bilateral ?  ?% Energy : 1.0 ms 40% ? ? Impedance: 1060 ohms ? ?Seizure Energy Index: 4098 ?V squared ? ?Postictal Suppression Index: 69% ? ?Seizure Concordance Index: 92% ? ?Medications ? ?Pre Shock: Brevital 60 mg succinylcholine 60 mg ? ?Post Shock: Versed 2 mg ? ?Seizure Duration: 33 seconds EMG 73 seconds EEG ? ? ?Comments: ?Last treatment of this index course ? ?Lungs: ? ?'[x]'$   Clear to auscultation              ? ?'[]'$  Other:  ? ?Heart: ?  ? '[x]'$   Regular rhythm             '[]'$  irregular rhythm ? ? ? '[x]'$   Previous H&P reviewed, patient examined and there are NO CHANGES              ?  ? '[]'$   Previous H&P reviewed, patient examined and there are changes noted. ? ? ?Alethia Berthold, MD ?3/13/20236:25 PM ? ? ? ?

## 2021-10-25 NOTE — Progress Notes (Signed)
Occupational Therapy Treatment ?Patient Details ?Name: Karen [name] ?MRN: [id number] ?DOB: 1976-06-21 ?Today's Date: 10/25/2021 ? ? ?History of present illness Pt admitted to [facility] on 10/04/21 after she was found down on the floor for several days, covered in urine and feces. Pt found to have UTI and met sepsis criteria with, lactic acidosis. Catatonic state being addressed via ECT. PMH significant for: depression, anxiety, seizure, thyroid nodule, hypokalemia, malnutrition, paranoid psychosis with hallucinations. ?  ?OT comments ? Pt seen for OT tx this date. Pt completed bed mobility with mod indep, stood within RW at sink with supv-SBA to complete grooming tasks. Pt noting mild dizziness throughout but improving once returned to bed. BP after 108/78 and pt endorsing 6/10 headache. RN aware. Per RN, pt cannot have anything prior to ECT. Pt functionally progressing towards goals. Recommendation updated to reflect progress.   ? ?Recommendations for follow up therapy are one component of a multi-disciplinary discharge planning process, led by the attending physician.  Recommendations may be updated based on patient status, additional functional criteria and insurance authorization. ?   ?Follow Up Recommendations ? Home health OT  ?  ?Assistance Recommended at Discharge Frequent or constant Supervision/Assistance  ?Patient can return home with the following ? A little help with walking and/or transfers;A little help with bathing/dressing/bathroom;Assistance with cooking/housework;Assist for transportation;Help with stairs or ramp for entrance;Direct supervision/assist for medications management;Direct supervision/assist for financial management ?  ?Equipment Recommendations ? BSC/3in1  ?  ?Recommendations for Other Services   ? ?  ?Precautions / Restrictions Precautions ?Precautions: Fall ?Precaution Comments: Aspiration; seizure ?Restrictions ?Weight Bearing Restrictions: No  ? ? ?  ? ?Mobility Bed Mobility ?Overal  bed mobility: Needs Assistance, Modified Independent ?Bed Mobility: Supine to Sit, Sit to Supine ?  ?  ?Supine to sit: Modified independent (Device/Increase time), HOB elevated ?Sit to supine: Modified independent (Device/Increase time), HOB elevated ?  ?  ?  ? ?Transfers ?Overall transfer level: Needs assistance ?Equipment used: Rolling walker (2 wheels) ?Transfers: Sit to/from Stand ?Sit to Stand: Supervision ?  ?  ?  ?  ?  ?  ?  ?  ?Balance Overall balance assessment: Needs assistance ?Sitting-balance support: Feet supported ?Sitting balance-Leahy Scale: Good ?  ?  ?Standing balance support: No upper extremity supported, During functional activity ?Standing balance-Leahy Scale: Good ?  ?  ?  ?  ?  ?  ?  ?  ?  ?  ?  ?  ?   ? ?ADL either performed or assessed with clinical judgement  ? ?ADL   ?  ?  ?Grooming: Standing;Supervision/safety;Wash/dry hands;Wash/dry face;Oral care;Applying deodorant ?Grooming Details (indicate cue type and reason): Pt completed tasks while standing at the sink, PRN VC to locate items, pt braced against counter for stability ?  ?  ?  ?  ?  ?  ?  ?  ?  ?  ?  ?  ?  ?  ?  ?  ?  ? ?Extremity/Trunk Assessment   ?  ?  ?  ?  ?  ? ?Vision   ?  ?  ?Perception   ?  ?Praxis   ?  ? ?Cognition Arousal/Alertness: Awake/alert ?Behavior During Therapy: [facility] for tasks assessed/performed ?Overall Cognitive Status: History of cognitive impairments - at baseline ?  ?  ?  ?  ?  ?  ?  ?  ?  ?  ?  ?  ?  ?  ?  ?  ?General Comments: pt alert, conversational, pleasant,  and follows commands ?  ?  ?   ?Exercises   ? ?  ?Shoulder Instructions   ? ? ?  ?General Comments    ? ? ?Pertinent Vitals/ Pain       Pain Assessment ?Pain Assessment: 0-10 ?Pain Score: 6  ?Pain Location: all over and headache only after exertion ?Pain Descriptors / Indicators: Headache, Aching ?Pain Intervention(s): Limited activity within patient's tolerance, Monitored during session, Repositioned, Patient requesting pain meds-RN notified ? ?Home  Living   ?  ?  ?  ?  ?  ?  ?  ?  ?  ?  ?  ?  ?  ?  ?  ?  ?  ?  ? ?  ?Prior Functioning/Environment    ?  ?  ?  ?   ? ?Frequency ? Min 2X/week  ? ? ? ? ?  ?Progress Toward Goals ? ?OT Goals(current goals can now be found in the care plan section) ? Progress towards OT goals: Progressing toward goals ? ?Acute Rehab OT Goals ?Patient Stated Goal: go home ?OT Goal Formulation: With patient ?Time For Goal Achievement: 10/30/21 ?Potential to Achieve Goals: Good  ?Plan Frequency remains appropriate;Discharge plan needs to be updated   ? ?Co-evaluation ? ? ?   ?  ?  ?  ?  ? ?  ?AM-PAC OT "6 Clicks" Daily Activity     ?Outcome Measure ? ? Help from another person eating meals?: None ?Help from another person taking care of personal grooming?: A Little ?Help from another person toileting, which includes using toliet, bedpan, or urinal?: A Little ?Help from another person bathing (including washing, rinsing, drying)?: A Little ?Help from another person to put on and taking off regular upper body clothing?: A Little ?Help from another person to put on and taking off regular lower body clothing?: A Little ?6 Click Score: 19 ? ?  ?End of Session   ? ?OT Visit Diagnosis: Muscle weakness (generalized) (M62.81);Other symptoms and signs involving the nervous system (R29.898) ?  ?Activity Tolerance Patient tolerated treatment well ?  ?Patient Left in bed;with call bell/phone within reach ?  ?Nurse Communication   ?  ? ?   ? ?Time: 8546-2703 ?OT Time Calculation (min): 19 min ? ?Charges: OT General Charges ?$OT Visit: 1 Visit ?OT Treatments ?$Self Care/Home Management : 8-22 mins ? ?Ardeth Perfect., MPH, MS, OTR/L ?ascom [phone] ?10/25/21, 12:33 PM ?

## 2021-10-25 NOTE — Transfer of Care (Signed)
Immediate Anesthesia Transfer of Care Note ? ?Patient: Karen [name] ? ?Procedure(s) Performed: ECT TX ? ?Patient Location: PACU ? ?Anesthesia Type:General ? ?Level of Consciousness: drowsy ? ?Airway & Oxygen Therapy: Patient Spontanous Breathing and Patient connected to face mask oxygen ? ?Post-op Assessment: Report given to RN and Post -op Vital signs reviewed and stable ? ?Post vital signs: Reviewed and stable ? ?Last Vitals:  ?Vitals Value Taken Time  ?BP    ?Temp    ?Pulse 87 10/25/21 1355  ?Resp 16 10/25/21 1355  ?SpO2 98 % 10/25/21 1355  ?Vitals shown include unvalidated device data. ? ?Last Pain:  ?Vitals:  ? 10/25/21 1146  ?TempSrc:   ?PainSc: 0-No pain  ?   ? ?  ? ?Complications: No notable events documented. ?

## 2021-10-25 NOTE — Progress Notes (Signed)
?   10/25/21 1000  ?Clinical Encounter Type  ?Visited With Patient;Health care provider  ?Visit Type Initial  ? ? ?

## 2021-10-25 NOTE — Progress Notes (Signed)
PT Cancellation Note ? ?Patient Details ?Name: Karen [name] ?MRN: [id number] ?DOB: 02-26-1976 ? ? ?Cancelled Treatment:    Reason Eval/Treat Not Completed: Other (comment) ? ?Pt out of room for ECT this pm.  Returned and pt eating lunch.  Requested session tomorrow due to fatigue.   ? ? ?Chesley Noon ?10/25/2021, 3:29 PM ?

## 2021-10-25 NOTE — Anesthesia Preprocedure Evaluation (Addendum)
Anesthesia Evaluation  ?Patient identified by MRN, date of birth, ID band ?Patient awake ? ? ? ?Reviewed: ?Allergy & Precautions, NPO status , Patient's Chart, lab work & pertinent test results ? ?History of Anesthesia Complications ?(+) PROLONGED EMERGENCE and history of anesthetic complications ? ?Airway ?Mallampati: III ? ?TM Distance: >3 FB ?Neck ROM: full ? ? ? Dental ? ?(+) Missing, Poor Dentition, Chipped ?  ?Pulmonary ?asthma , sleep apnea ,  ?  ?Pulmonary exam normal ? ? ? ? ? ? ? Cardiovascular ?Exercise Tolerance: Poor ?(-) Past MI Normal cardiovascular exam ? ? ?  ?Neuro/Psych ? Headaches, Seizures -, Well Controlled,  PSYCHIATRIC DISORDERS Ehlers-Danlos syndrome ? Neuromuscular disease   ? GI/Hepatic ?negative GI ROS, Neg liver ROS, neg GERD  ,  ?Endo/Other  ?negative endocrine ROS ? Renal/GU ?negative Renal ROS  ?negative genitourinary ?  ?Musculoskeletal ? ?(+) Arthritis , Fibromyalgia - ? Abdominal ?Normal abdominal exam  (+)   ?Peds ? Hematology ?negative hematology ROS ?(+)   ?Anesthesia Other Findings ?Past Medical History: ?No date: Ehlers-Danlos syndrome ?No date: Fibromyalgia ?No date: Headache ?No date: Sleep apnea ?No date: Tics of organic origin ?No date: Transient alteration of awareness ? ?Past Surgical History: ?02/13/2018: COLONOSCOPY WITH PROPOFOL; N/A ?    Comment:  Procedure: COLONOSCOPY WITH PROPOFOL;  Surgeon: Allen Norris,  ?             Darren, MD;  Location: [facility];  Service:  ?             Endoscopy;  Laterality: N/A; ?2009: DILATION AND CURETTAGE OF UTERUS ?02/13/2018: ESOPHAGOGASTRODUODENOSCOPY (EGD) WITH PROPOFOL ?    Comment:  Procedure: ESOPHAGOGASTRODUODENOSCOPY (EGD) WITH  ?             PROPOFOL;  Surgeon: [name], MD;  Location: [facility]  ?             ENDOSCOPY;  Service: Endoscopy;; ?1990: EYE SURGERY; Left ?    Comment:  3 Surgeries on left eye to correct crossed eye ?2003: LAPAROSCOPY; Left ?    Comment:  Fallopian Tube ?1998: MANDIBLE  SURGERY ?1987: OTHER SURGICAL HISTORY; Right ?    Comment:  3 Surgeries to repair broken arm ?No date: OTHER SURGICAL HISTORY; Left ?    Comment:  3 surgeries on her left thigh as an infant ?1998: OTHER SURGICAL HISTORY ?    Comment:  Jaw surgery ?1987: Right arm surgery ?    Comment:  x 3 due to fracture ?06/13/2019: TOENAIL EXCISION; Bilateral ?    Comment:  Procedure: BILATERAL SECOND TOE PARTIAL NAIL ABLATION;   ?             Surgeon: Erle Crocker, MD;  Location: [facility]  ?             SURGERY CENTER;  Service: Orthopedics;  Laterality:  ?             Bilateral;  SURGERY REQUEST TIME 1 HOUR ?12/13/2002: TONSILLECTOMY ? ? ? ? Reproductive/Obstetrics ?negative OB ROS ? ?  ? ? ? ? ? ? ? ? ? ? ? ? ? ?  ?  ? ? ? ? ? ? ? ?Anesthesia Physical ? ?Anesthesia Plan ? ?ASA: 3 ? ?Anesthesia Plan: General  ? ?Post-op Pain Management:   ? ?Induction: Intravenous ? ?PONV Risk Score and Plan: TIVA ? ?Airway Management Planned: Mask ? ?Additional Equipment:  ? ?Intra-op Plan:  ? ?Post-operative Plan:  ? ?Informed Consent: I have reviewed the  patients History and Physical, chart, labs and discussed the procedure including the risks, benefits and alternatives for the proposed anesthesia with the patient or authorized representative who has indicated his/her understanding and acceptance.  ? ? ? ?Dental Advisory Given ? ?Plan Discussed with: Anesthesiologist, CRNA and Surgeon ? ?Anesthesia Plan Comments: (History and phone consent from the patients mother Mykal Kirchman ? ?Mother consented for risks of anesthesia including but not limited to:  ?- adverse reactions to medications ?- risk of airway placement if required ?- damage to eyes, teeth, lips or other oral mucosa ?- nerve damage due to positioning  ?- sore throat or hoarseness ?- Damage to heart, brain, nerves, lungs, other parts of body or loss of life ? ?She voiced understanding.)  ? ? ? ? ? ? ?Anesthesia Quick Evaluation ? ?

## 2021-10-25 NOTE — Progress Notes (Signed)
?PROGRESS NOTE ? ?Karen [name]    DOB: [birth year], 46 y.o.  ?[id number]  ?  Code Status: Full Code   ?DOA: 10/04/2021   LOS: 20  ? ?Brief hospital course  ?Karen [name] is a 46 y.o. female with a PMH significant for Erler's Danlos syndrome, fibromyalgia, sleep apnea, transient alteration of awareness, tics of organic origin, headaches. ?They presented from home to the ED on 10/04/2021 with found down x several days days.  She was found lying on the floor covered with urine and feces ?In the ED, it was found that they had UTI and met sepsis criteria with, lactic acidosis.  ?They were treated with ceftriaxone and IV fluids.  ?Patient was admitted to medicine service for further workup and management of AMS as outlined in detail below. ? ?10/25/21 -medically stable.  Much more talkative and interactive today. ? ?Assessment & Plan  ?Principal Problem: ?  Catatonia ?Active Problems: ?  Depression with anxiety ?  Seizure disorder ([place]) ?  Sepsis secondary to UTI [facility]) ?  AMS (altered mental status) ?  Thyroid nodule ?  Hypokalemia ?  Elevated troponin ?  Depression, major, recurrent, severe with psychosis ([place]) ?  Malnutrition of moderate degree ?  Encounter for nasogastric (NG) tube placement ?  Urinary tract infection without hematuria ? ?History of paranoid psychosis with hallucinations and delusions/catatonia ?Severe major depression and anxiety-patient presented in catatonic state and is showing improvement after ECT. Today, patient is alert and conversant appropriately. Seems that she continues to have longer periods of alertness progressing with more treatments.  ?Prior attending spoke with patient's mother who indicated that she will not be able to provide the level of care that her daughter requires at this time. If/when she becomes independent, she can live with her mom again but will need discharge to SNF vs inpatient psych unit at discharge pending how she progresses with ECT, medications,  therapy. ?-Psychiatry following, appreciate recommendations ? -ECT per psychiatry-patient notably improving ? -Continue Zyprexa increased to 38m nightly ?- PT/OT ? ?Hypokalemia -K 3.6, normalized.  Monitor replace as needed ? ?Sepsis due to UTI-sepsis physiology has resolved.  Completed course of Rocephin for UTI. ? ?Elevated troponins on admission-likely from demand ischemia.  No cardiac history, unable to assess if she had chest pain, echo unremarkable. Cardiology evaluated ? ?Failure to thrive-poor nutrition status due to poor PO intake in sedated state and did not tolerate NG tube. Blood sugars have remained stable. Patient is NPO prior to ECT treatments. Optimistically she is eating more so will not need enteral feeds. As she is responding to treatment, will need to monitor closely for refeeding syndrome. ?Phosphorus, magnesium normal. Down approximately 40 pounds from her weight prior to her current illness.  ?- strict I/O ?- calorie count ?- nutrition consult ?-Encourage p.o. intake ?- discontinued IV fluids as she has increased PO intake.  ? ?Thyroid nodule- thyroid ultrasound Diffusely heterogeneous and enlarged thyroid gland most consistent with diffuse goitrous change. ? - Recommend follow-up ultrasound in 1 year ? ?Body mass index is 27.12 kg/m?. ? ?VTE ppx: enoxaparin (LOVENOX) injection 40 mg Start: 10/11/21 2200 ?SCDs Start: 10/04/21 2355 ? ?Diet:  ?   ?Diet  ? Diet regular Room service appropriate? Yes; Fluid consistency: Thin  ? ?Subjective 10/25/21   ? ?Pt awake resting in bed this AM.  Reports some lightheadedness today that gets worse when sitting up or standing.  Did not get breakfast, ECT planned for today.  Currently NPO.  No  acute events reported.  Continues to eat well and be more conversational during encounters.  ?  ?Objective  ? ?Vitals:  ? 10/24/21 2007 10/25/21 0511 10/25/21 0800 10/25/21 1035  ?BP: 111/81 107/81 121/84 115/78  ?Pulse: (!) 102 78 91 80  ?Resp: _0 ?Temp: 98.1  ?F (36.7 ?C) 97.7 ?F (36.5 ?C) 97.8 ?F (36.6 ?C) 98 ?F (36.7 ?C)  ?TempSrc: Oral  Oral Oral  ?SpO2: 97% 100% 100% 99%  ?Weight:      ?Height:   _1  (1.499 m)   ? ? ?Intake/Output Summary (Last 24 hours) at 10/25/2021 1248 ?Last data filed at 10/24/2021 1900 ?Gross per 24 hour  ?Intake 480 ml  ?Output --  ?Net 480 ml  ? ? ?Filed Weights  ? 10/04/21 1900 10/17/21 0500  ?Weight: 60.8 kg 60.9 kg  ?  ? ?Physical Exam:  ?General: Awake and alert, resting in bed, no acute distress, conversational ?Respiratory: normal respiratory effort, on room air ?Cardiovascular: Regular rate and rhythm, no peripheral edema ?Nervous: Normal speech, grossly nonfocal exam ?Skin: dry, intact, no rashes seen ?Psych: Normal mood, flat affect, interactive and conversational ? ?Labs & data reviewed  ? ? ?No new labs for review today ? ? ? ?Disposition Plan & Communication  ?Patient status: Inpatient  ?Admitted From: Home ? ?Planned disposition location: SNF vs home with home health.   ?Pending clearance for discharge by psych once ECT treatments are completed. ?APS possibly pursuing guardianship. ? ?Anticipated discharge date: TBD pending clearance psychiatry, outcome of APS's involvement ? ?Family Communication: None at bedside.  No new medical updates at this time. ?  ?Author: ?Ezekiel Slocumb, DO ?[facility] ?10/25/2021, 12:48 PM  ? ? ? ?

## 2021-10-26 DIAGNOSIS — F061 Catatonic disorder due to known physiological condition: Secondary | ICD-10-CM | POA: Diagnosis not present

## 2021-10-26 DIAGNOSIS — E876 Hypokalemia: Secondary | ICD-10-CM | POA: Diagnosis not present

## 2021-10-26 DIAGNOSIS — R627 Adult failure to thrive: Secondary | ICD-10-CM | POA: Diagnosis not present

## 2021-10-26 DIAGNOSIS — N390 Urinary tract infection, site not specified: Secondary | ICD-10-CM | POA: Diagnosis not present

## 2021-10-26 LAB — BASIC METABOLIC PANEL
Anion gap: 7 (ref 5–15)
BUN: 24 mg/dL — ABNORMAL HIGH (ref 6–20)
CO2: 27 mmol/L (ref 22–32)
Calcium: 8.8 mg/dL — ABNORMAL LOW (ref 8.9–10.3)
Chloride: 106 mmol/L (ref 98–111)
Creatinine, Ser: 0.63 mg/dL (ref 0.44–1.00)
GFR, Estimated: >60 mL/min (ref >60)
Glucose, Bld: 112 mg/dL — ABNORMAL HIGH (ref 70–99)
Potassium: 3.6 mmol/L (ref 3.5–5.1)
Sodium: 140 mmol/L (ref 135–145)

## 2021-10-26 NOTE — Anesthesia Postprocedure Evaluation (Signed)
Anesthesia Post Note ? ?Patient: Karen [name] ? ?Procedure(s) Performed: ECT TX ? ?Patient location during evaluation: PACU ?Anesthesia Type: General ?Level of consciousness: awake and alert ?Pain management: pain level controlled ?Vital Signs Assessment: post-procedure vital signs reviewed and stable ?Respiratory status: spontaneous breathing, nonlabored ventilation and respiratory function stable ?Cardiovascular status: blood pressure returned to baseline and stable ?Postop Assessment: no apparent nausea or vomiting ?Anesthetic complications: no ? ? ?No notable events documented. ? ? ?Last Vitals:  ?Vitals:  ? 10/25/21 1408 10/25/21 1420  ?BP: 114/82 (!) 120/95  ?Pulse: 92 96  ?Resp: 18 (!) 21  ?Temp:    ?SpO2: 94% 96%  ?  ?Last Pain:  ?Vitals:  ? 10/25/21 1420  ?TempSrc:   ?PainSc: 0-No pain  ? ? ?  ?  ?  ?  ?  ?  ? ?[name] ? ? ? ? ?

## 2021-10-26 NOTE — Progress Notes (Signed)
Physical Therapy Treatment ?Patient Details ?Name: Karen [name] ?MRN: [id number] ?DOB: [birth year] ?Today's Date: 10/26/2021 ? ? ?History of Present Illness Pt admitted to [facility] on 10/04/21 after she was found down on the floor for several days, covered in urine and feces. Pt found to have UTI and met sepsis criteria with, lactic acidosis. Catatonic state being addressed via ECT. PMH significant for: depression, anxiety, seizure, thyroid nodule, hypokalemia, malnutrition, paranoid psychosis with hallucinations. ? ?  ?PT Comments  ? ? Pt is making good progress towards goals with ability to tolerate floor transfers with close guard. Educated on fall preventions. Per chart, plans to dc tomorrow. Will continue to progress.  ?Recommendations for follow up therapy are one component of a multi-disciplinary discharge planning process, led by the attending physician.  Recommendations may be updated based on patient status, additional functional criteria and insurance authorization. ? ?Follow Up Recommendations ? Home health PT ?  ?  ?Assistance Recommended at Discharge Intermittent Supervision/Assistance  ?Patient can return home with the following A little help with walking and/or transfers;A little help with bathing/dressing/bathroom;Assistance with cooking/housework;Direct supervision/assist for financial management;Assist for transportation ?  ?Equipment Recommendations ? None recommended by PT  ?  ?Recommendations for Other Services   ? ? ?  ?Precautions / Restrictions Precautions ?Precautions: Fall ?Precaution Comments: Aspiration; seizure ?Restrictions ?Weight Bearing Restrictions: No  ?  ? ?Mobility ? Bed Mobility ?Overal bed mobility: Modified Independent ?Bed Mobility: Supine to Sit, Sit to Supine ?  ?  ?Supine to sit: Modified independent (Device/Increase time), HOB elevated ?Sit to supine: Modified independent (Device/Increase time), HOB elevated ?  ?General bed mobility comments: quick and ease of transfer. Once  seated at EOB, upright posture noted ?  ? ?Transfers ?Overall transfer level: Needs assistance ?Equipment used: None ?Transfers: Sit to/from Stand ?Sit to Stand: Supervision ?  ?  ?  ?  ?  ?General transfer comment: able to stand a few reps with safe technique and no AD ?  ? ?Ambulation/Gait ?  ?  ?  ?  ?  ?  ?  ?General Gait Details: not performed this date ? ? ?Stairs ?  ?  ?  ?  ?  ? ? ?Wheelchair Mobility ?  ? ?Modified Rankin (Stroke Patients Only) ?  ? ? ?  ?Balance   ?  ?  ?  ?  ?  ?  ?  ?  ?  ?  ?  ?  ?  ?  ?  ?  ?  ?  ?  ? ?  ?Cognition Arousal/Alertness: Awake/alert ?Behavior During Therapy: [facility] for tasks assessed/performed ?Overall Cognitive Status: History of cognitive impairments - at baseline ?  ?  ?  ?  ?  ?  ?  ?  ?  ?  ?  ?  ?  ?  ?  ?  ?General Comments: alert and conversational, eager to perform PT session ?  ?  ? ?  ?Exercises Other Exercises ?Other Exercises: per family request, performed 2 floor transfers. Close supervision and cga given. Education given to pt/family on falls prevention ? ?  ?General Comments   ?  ?  ? ?Pertinent Vitals/Pain Pain Assessment ?Pain Assessment: No/denies pain  ? ? ?Home Living   ?  ?  ?  ?  ?  ?  ?  ?  ?  ?   ?  ?Prior Function    ?  ?  ?   ? ?PT Goals (current goals can now  be found in the care plan section) Acute Rehab PT Goals ?Patient Stated Goal: none stated ?PT Goal Formulation: With patient ?Progress towards PT goals: Progressing toward goals ? ?  ?Frequency ? ? ? Min 2X/week ? ? ? ?  ?PT Plan Current plan remains appropriate  ? ? ?Co-evaluation   ?  ?  ?  ?  ? ?  ?AM-PAC PT "6 Clicks" Mobility   ?Outcome Measure ? Help needed turning from your back to your side while in a flat bed without using bedrails?: None ?Help needed moving from lying on your back to sitting on the side of a flat bed without using bedrails?: None ?Help needed moving to and from a bed to a chair (including a wheelchair)?: A Little ?Help needed standing up from a chair using your arms  (e.g., wheelchair or bedside chair)?: A Little ?Help needed to walk in hospital room?: A Little ?Help needed climbing 3-5 steps with a railing? : A Lot ?6 Click Score: 19 ? ?  ?End of Session Equipment Utilized During Treatment: Gait belt ?Activity Tolerance: Patient tolerated treatment well ?Patient left: in bed;with family/visitor present ?Nurse Communication: Mobility status ?PT Visit Diagnosis: Unsteadiness on feet (R26.81);Muscle weakness (generalized) (M62.81) ?  ? ? ?Time: 7556-2392 ?PT Time Calculation (min) (ACUTE ONLY): 14 min ? ?Charges:  $Therapeutic Exercise: 8-22 mins          ?          ? ?Greggory Stallion, PT, DPT, GCS ?[phone] ? ? ? ?Karen [name] ?10/26/2021, 5:10 PM ? ?

## 2021-10-26 NOTE — Progress Notes (Signed)
?PROGRESS NOTE ? ?Karen [name]    DOB: 06-Dec-1975, 46 y.o.  ?[id number]  ?  Code Status: Full Code   ?DOA: 10/04/2021   LOS: 21  ? ?Brief hospital course  ?Karen [name] is a 46 y.o. female with a PMH significant for Erler's Danlos syndrome, fibromyalgia, sleep apnea, transient alteration of awareness, tics of organic origin, headaches. ?They presented from home to the ED on 10/04/2021 with found down x several days days.  She was found lying on the floor covered with urine and feces ?In the ED, it was found that they had UTI and met sepsis criteria with, lactic acidosis.  ?They were treated with ceftriaxone and IV fluids.  ?Patient was admitted to medicine service for further workup and management of AMS as outlined in detail below. ? ?10/26/21 -medically stable.  Much more talkative and interactive today. ? ?Assessment & Plan  ?Principal Problem: ?  Catatonia ?Active Problems: ?  Depression with anxiety ?  Seizure disorder ([place]) ?  Sepsis secondary to UTI [facility]) ?  AMS (altered mental status) ?  Thyroid nodule ?  Hypokalemia ?  Elevated troponin ?  Depression, major, recurrent, severe with psychosis ([place]) ?  Malnutrition of moderate degree ?  Encounter for nasogastric (NG) tube placement ?  Urinary tract infection without hematuria ? ?History of paranoid psychosis with hallucinations and delusions/catatonia ?Severe major depression and anxiety-patient presented in catatonic state and has shown significant improvement after multiple ECT sessions.  ?-Psychiatry following, appreciate recommendations ? -ECT per psychiatry-awaiting confirmation that session yesterday 3/13 was last planned ? -Continue Zyprexa increased to 43m nightly ?-Confirm outpatient psychiatric follow-up has been arranged ?- PT/OT -now recommending home health ? ?Hypokalemia - K 3.6, normalized.  Monitor replace as needed ? ?Sepsis due to UTI-sepsis physiology has resolved.  Completed course of Rocephin for UTI. ? ?Elevated troponins on  admission-likely from demand ischemia.  No cardiac history, unable to assess if she had chest pain, echo unremarkable. Cardiology evaluated ? ?Failure to thrive-improving.  Patient is now eating and drinking well. ?Patient has poor nutrition status due to poor PO intake due to catatonia.   ?Down approximately 40 pounds from her weight prior to her current illness.  ?NG tube was attempted earlier this admission but patient did not tolerate. Blood sugars have remained stable.  ?She has responded well to ECT treatments, now has adequate p.o. intake and labs not showing signs of significant dehydration or electrolyte abnormalities at this point. ?-Dietitian consulted, appreciate recommendations ?-Encourage p.o. intake ?-Off IV fluids ?-Monitor renal function and electrolytes periodically, has been stable ? ?Thyroid nodule- thyroid ultrasound Diffusely heterogeneous and enlarged thyroid gland most consistent with diffuse goitrous change. ? - Recommend follow-up ultrasound in 1 year ? ?Body mass index is 27.12 kg/m?. ? ?VTE ppx: enoxaparin (LOVENOX) injection 40 mg Start: 10/11/21 2200 ?SCDs Start: 10/04/21 2355 ? ?Diet:  ?   ?Diet  ? Diet regular Room service appropriate? Yes; Fluid consistency: Thin  ? ?Subjective 10/26/21   ? ?Pt awake sitting up in bed with mother at bedside when seen this morning.  TOC and APS social worker had just been in the room speaking with patient and mother.  Mother remains concerned about patient returning home because of needing more help that she is able to provide.  TOC and APS are working on home health and additional aides to assist.  Patient's mother says "I need at least 2 days notice" and I told her that she is being given notice  right now that patient is medically stable and ready for discharge once psychiatry confirms no further ECT treatments are planned. ? ?Patient reports feeling well today.  She has no complaints and reports eating and drinking well. ?  ?Objective  ? ?Vitals:   ? 10/25/21 1035 10/25/21 1612 10/25/21 1946 10/26/21 0601  ?BP: 115/78 118/77 109/73 98/76  ?Pulse: 80 (!) 110 98 81  ?Resp: _0 ?Temp: 98 ?F (36.7 ?C) 98.2 ?F (36.8 ?C) 98.3 ?F (36.8 ?C) (!) 97.5 ?F (36.4 ?C)  ?TempSrc: Oral Oral Oral Oral  ?SpO2: 99% 99% 98% 99%  ?Weight:      ?Height:      ? ? ?Intake/Output Summary (Last 24 hours) at 10/26/2021 1402 ?Last data filed at 10/26/2021 1322 ?Gross per 24 hour  ?Intake 600 ml  ?Output --  ?Net 600 ml  ? ? ?Filed Weights  ? 10/04/21 1900 10/17/21 0500  ?Weight: 60.8 kg 60.9 kg  ?  ? ?Physical Exam:  ?General: Awake and alert, resting in bed, no acute distress, conversational ?Respiratory: normal respiratory effort, on room air ?Cardiovascular: Regular rate and rhythm, no peripheral edema ?Nervous: Normal speech, grossly nonfocal exam ?Skin: dry, intact, no rashes seen ?Psych: Normal mood, flat affect, interactive and conversational ? ?Labs & data reviewed  ? ? ?No new labs for review today ? ? ? ?Disposition Plan & Communication  ?Patient status: Inpatient  ?Admitted From: Home ? ?Planned disposition location: Home with home health.   ?Medically stable for discharge.   ?Awaiting psychiatry to confirm no further ECT treatments. ?APS is following, sounds like they will not attempt to pursue guardianship at this time but will remain involved after discharge. ? ?Anticipated discharge date: 3/15 ? ?Family Communication: Patient's mother was at bedside on rounds this morning.  She is concerned about the amount of assistance patient requires.  Stated she needs at least 2 days notice before bringing the patient home (clarified that I was giving her notice today patient stable and will be discharged once home health has been arranged).   ? ? ? ?Author: ?Ezekiel Slocumb, DO ?[facility] ?10/26/2021, 2:02 PM  ? ? ? ?

## 2021-10-26 NOTE — Care Management Important Message (Signed)
Important Message ? ?Patient Details  ?Name: Karen [name] ?MRN: [id number] ?Date of Birth: 06-28-1976 ? ? ?Medicare Important Message Given:  Yes ? ?I reviewed the Important Message from Medicare again with her mother, Karen [name] and she stated she understood her daughter's rights.  I thanked her for signing the initial IMM on 10/15/2021, I wished her and her family well and thanked her for her time. ? ? ?Juliann Pulse A Claris Pech ?10/26/2021, 3:23 PM ?

## 2021-10-26 NOTE — Consult Note (Signed)
[facility] Face-to-Face Psychiatry Consult   Reason for Consult: Follow-up consult for 46 year old woman who has been receiving ECT Referring Physician: Denton Lank Patient Identification: Karen [name] MRN:  [id number] Principal Diagnosis: Catatonia Diagnosis:  Principal Problem:   Catatonia Active Problems:   Depression with anxiety   Seizure disorder (HCC)   Sepsis secondary to UTI (HCC)   AMS (altered mental status)   Thyroid nodule   Hypokalemia   Elevated troponin   Depression, major, recurrent, severe with psychosis (HCC)   Malnutrition of moderate degree   Encounter for nasogastric (NG) tube placement   Urinary tract infection without hematuria   Total Time spent with patient: 30 minutes  Subjective:   Karen [name] is a 46 y.o. female patient admitted with "I just cannot remember much".  HPI: Patient seen and chart reviewed.  Also spoke with the patient's mother who was present.  Patient was awake with smiling upbeat affect.  Did not report any delusional thinking.  Mood feeling better.  She is eating better.  She is making some effort to get her strength back.  According to her mother however she is still not nearly as strong as she was at baseline and the mother is worried about the patient coming home.  Patient is complaining of memory problems having short-term memory problems and confusion about what brought her into the hospital.  She also mentions that she is having a symptom of hearing music that other people do not seem to be hearing.  It sounds like this comes and goes and mostly happens when no one else is around.  Past Psychiatric History: Long episode of what sounds like a psychotic depression but nothing else previous really  Risk to Self:   Risk to Others:   Prior Inpatient Therapy:   Prior Outpatient Therapy:    Past Medical History:  Past Medical History:  Diagnosis Date   Ehlers-Danlos syndrome    Fibromyalgia    Headache    Sleep apnea    Tics of  organic origin    Transient alteration of awareness     Past Surgical History:  Procedure Laterality Date   COLONOSCOPY WITH PROPOFOL N/A 02/13/2018   Procedure: COLONOSCOPY WITH PROPOFOL;  Surgeon: Midge Minium, MD;  Location: [facility] END[facility]COPY;  Service: Endoscopy;  Laterality: N/A;   DILATION AND CURETTAGE OF UTERUS  2009   ESOPHAGOGASTRODUODENOSCOPY (EGD) WITH PROPOFOL  02/13/2018   Procedure: ESOPHAGOGASTRODUODENOSCOPY (EGD) WITH PROPOFOL;  Surgeon: Midge Minium, MD;  Location: [facility] END[facility]COPY;  Service: Endoscopy;;   EYE SURGERY Left 1990   3 Surgeries on left eye to correct crossed eye   LAPAROSCOPY Left 2003   Fallopian Tube   MANDIBLE SURGERY  1998   OTHER SURGICAL HISTORY Right 1987   3 Surgeries to repair broken arm   OTHER SURGICAL HISTORY Left    3 surgeries on her left thigh as an infant   OTHER SURGICAL HISTORY  1998   Jaw surgery   Right arm surgery  1987   x 3 due to fracture   TOENAIL EXCISION Bilateral 06/13/2019   Procedure: BILATERAL SECOND TOE PARTIAL NAIL ABLATION;  Surgeon: Terance Hart, MD;  Location: [facility];  Service: Orthopedics;  Laterality: Bilateral;  SURGERY REQUEST TIME 1 HOUR   TONSILLECTOMY  12/13/2002   Family History:  Family History  Problem Relation Age of Onset   Depression Mother    Stroke Mother    Fibromyalgia Mother    Osteoarthritis Mother  Asthma Brother    Depression Brother    Migraines Brother    Asperger's syndrome Brother    Other Brother        Ehlers-Danlos Syndrome   Cancer Maternal Aunt    Asperger's syndrome Maternal Aunt    Breast cancer Maternal Aunt    Heart disease Paternal Aunt    Heart disease Paternal Uncle    Cervical cancer Maternal Grandmother    Osteoporosis Maternal Grandmother        Died at 49   Osteoarthritis Maternal Grandmother    Colon cancer Maternal Grandfather    Asthma Maternal Grandfather    Asperger's syndrome Maternal Grandfather    Emphysema Maternal Grandfather         Died at 39   Prostate cancer Maternal Grandfather    Heart attack Paternal Grandfather        Died at 63   Asthma Paternal Grandfather    Tuberculosis Paternal Grandfather    Cancer Cousin    Asperger's syndrome Cousin    Asperger's syndrome Other    Heart Problems Paternal Grandmother        Died at 70   Ehlers-Danlos syndrome Father    Ehlers-Danlos syndrome Brother    Family Psychiatric  History: See previous Social History:  Social History   Substance and Sexual Activity  Alcohol Use No   Alcohol/week: 0.0 standard drinks     Social History   Substance and Sexual Activity  Drug Use No    Social History   Socioeconomic History   Marital status: Single    Spouse name: Not on file   Number of children: Not on file   Years of education: Not on file   Highest education level: Not on file  Occupational History   Not on file  Tobacco Use   Smoking status: Never   Smokeless tobacco: Never  Vaping Use   Vaping Use: Never used  Substance and Sexual Activity   Alcohol use: No    Alcohol/week: 0.0 standard drinks   Drug use: No   Sexual activity: Never  Other Topics Concern   Not on file  Social History Narrative   Jellisa is 15.   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    She lives with her mother.    She enjoys reading, watching TV, writing, and painting.    Social Determinants of Health   Financial Resource Strain: Not on file  Food Insecurity: Not on file  Transportation Needs: Not on file  Physical Activity: Not on file  Stress: Not on file  Social Connections: Not on file   Additional Social History:    Allergies:   Allergies  Allergen Reactions   Augmentin  [Amoxicillin-Pot Clavulanate] Diarrhea   Chocolate Flavor     Other reaction(s): Other (See Comments) Wheezing, acne   Demerol [Meperidine] Other (See Comments)    Slow to wake up when this drug is given.    Keflex [Cephalexin] Nausea And Vomiting   Morphine And Related Nausea And  Vomiting   Tape Dermatitis    Must use paper tape only   Toradol [Ketorolac Tromethamine] Nausea And Vomiting   Amoxicillin     Other reaction(s): Unknown   Chocolate     GI distress   Nsaids     patient develops ulcers Other reaction(s): Other (See Comments) patient develops ulcers   Strawberry Extract     GI distress    Labs:  Results for orders placed or performed during the  hospital encounter of 10/04/21 (from the past 48 hour(s))  Basic metabolic panel     Status: Abnormal   Collection Time: 10/26/21  5:13 AM  Result Value Ref Range   Sodium 140 135 - 145 mmol/L   Potassium 3.6 3.5 - 5.1 mmol/L   Chloride 106 98 - 111 mmol/L   CO2 27 22 - 32 mmol/L   Glucose, Bld 112 (H) 70 - 99 mg/dL    Comment: Glucose reference range applies only to samples taken after fasting for at least 8 hours.   BUN 24 (H) 6 - 20 mg/dL   Creatinine, Ser 6.04 0.44 - 1.00 mg/dL   Calcium 8.8 (L) 8.9 - 10.3 mg/dL   GFR, Estimated >54 >09 mL/min    Comment: (NOTE) Calculated using the CKD-EPI Creatinine Equation (2021)    Anion gap 7 5 - 15    Comment: Performed at [facility], [address] [place], [place] [address]    Current Facility-Administered Medications  Medication Dose Route Frequency Provider Last Rate Last Admin   0.9 %  sodium chloride infusion   Intravenous PRN Enedina Finner, MD   Stopped at 10/25/21 1354   acetaminophen (TYLENOL) tablet 650 mg  650 mg Oral Q6H PRN Gertha Calkin, MD   650 mg at 10/26/21 4782   Or   acetaminophen (TYLENOL) suppository 650 mg  650 mg Rectal Q6H PRN Gertha Calkin, MD       albuterol (PROVENTIL) (2.5 MG/3ML) 0.083% nebulizer solution 2.5 mg  2.5 mg Nebulization Q6H PRN Otelia Sergeant, RPH       baclofen (LIORESAL) tablet 5 mg  5 mg Oral BID PRN Leeroy Bock, MD   5 mg at 10/23/21 2005   enoxaparin (LOVENOX) injection 40 mg  40 mg Subcutaneous Q24H Enedina Finner, MD   40 mg at 10/25/21 2154   escitalopram (LEXAPRO) tablet 10 mg  10 mg  Oral Daily Lawrencia Mauney, Jackquline Denmark, MD   10 mg at 10/26/21 9562   feeding supplement (BOOST / RESOURCE BREEZE) liquid 1 Container  1 Container Oral TID BM Leeroy Bock, MD   1 Container at 10/26/21 1329   haloperidol (HALDOL) tablet 2 mg  2 mg Oral Q6H PRN Esaw Grandchild A, DO       Or   haloperidol lactate (HALDOL) injection 2 mg  2 mg Intramuscular Q6H PRN Esaw Grandchild A, DO       ibuprofen (ADVIL) tablet 200 mg  200 mg Oral Q6H PRN Leeroy Bock, MD   200 mg at 10/15/21 1811   multivitamin with minerals tablet 1 tablet  1 tablet Oral Daily Leeroy Bock, MD   1 tablet at 10/26/21 0917   OLANZapine zydis (ZYPREXA) disintegrating tablet 15 mg  15 mg Oral QHS Shellie Goettl T, MD   15 mg at 10/25/21 2150   sodium chloride flush (NS) 0.9 % injection 3 mL  3 mL Intravenous Q12H Gertha Calkin, MD   3 mL at 10/26/21 1308    Musculoskeletal: Strength & Muscle Tone: decreased Gait & Station: unsteady Patient leans: N/A            Psychiatric Specialty Exam:  Presentation  General Appearance: Disheveled  Eye Contact:Fair  Speech:Normal Rate; Clear and Coherent  Speech Volume:Normal  Handedness:Right   Mood and Affect  Mood:Anxious  Affect:Congruent   Thought Process  Thought Processes:Disorganized  Descriptions of Associations:Tangential  Orientation:Full (Time, Place and Person)  Thought Content:Tangential  History of  Schizophrenia/Schizoaffective disorder:No  Duration of Psychotic Symptoms:Greater than six months (Since August 2022)  Hallucinations:No data recorded Ideas of Reference:Paranoia  Suicidal Thoughts:No data recorded Homicidal Thoughts:No data recorded  Sensorium  Memory:Immediate Fair  Judgment:Impaired  Insight:Lacking   Executive Functions  Concentration:Fair  Attention Span:Fair  Recall:Fair  Fund of Knowledge:Good  Language:Good   Psychomotor Activity  Psychomotor Activity:No data recorded  Assets   Assets:Communication Skills; Leisure Time; Social Support; Resilience   Sleep  Sleep:No data recorded  Physical Exam: Physical Exam Vitals and nursing note reviewed.  Constitutional:      Appearance: Normal appearance.  HENT:     Head: Normocephalic and atraumatic.     Mouth/Throat:     Pharynx: Oropharynx is clear.  Eyes:     Pupils: Pupils are equal, round, and reactive to light.  Cardiovascular:     Rate and Rhythm: Normal rate and regular rhythm.  Pulmonary:     Effort: Pulmonary effort is normal.     Breath sounds: Normal breath sounds.  Abdominal:     General: Abdomen is flat.     Palpations: Abdomen is soft.  Musculoskeletal:        General: Normal range of motion.  Skin:    General: Skin is warm and dry.  Neurological:     General: No focal deficit present.     Mental Status: She is alert. Mental status is at baseline.  Psychiatric:        Attention and Perception: Attention normal.        Mood and Affect: Mood normal.        Speech: Speech normal.        Behavior: Behavior is cooperative.        Thought Content: Thought content normal.        Cognition and Memory: Memory is impaired.   Review of Systems  Constitutional:  Positive for malaise/fatigue.  HENT: Negative.    Eyes: Negative.   Respiratory: Negative.    Cardiovascular: Negative.   Gastrointestinal: Negative.   Musculoskeletal: Negative.   Skin: Negative.   Neurological:  Positive for weakness.  Psychiatric/Behavioral:  Positive for memory loss. Negative for depression, substance abuse and suicidal ideas.   Blood pressure 109/87, pulse (!) 103, temperature 98.2 F (36.8 C), resp. rate 15, height 4\' 11"  (1.499 m), weight 60.9 kg, SpO2 99 %. Body mass index is 27.12 kg/m.  Treatment Plan Summary: Medication management and Plan patient has had great improvement with ECT.  She is having short-term memory impairment as would be expected.  Patient and mother were reassured that this should improve  and short-term memory and function should get back to normal within 1 to 2 weeks.  She has plateaued with ECT and I do not recommend further indexed treatment at this time.  Because she had not had a failure of treatment before I would not necessarily recommend maintenance ECT but rather recommend medication treatment.  She is now on olanzapine and Lexapro.  Reviewed medications with patient and mother and suggested they stay on these and need outpatient psychiatric treatment.  Patient does not at this point meet criteria for inpatient psychiatric hospitalization.  We will follow up.  Mother is concerned about taking care of the patient at discharge and wants to talk with the staff about the possibility of home health assistance if rehab is not an option  Disposition: No evidence of imminent risk to self or others at present.   Patient does not meet criteria for psychiatric inpatient  admission. Discussed crisis plan, support from social network, calling 911, coming to the Emergency Department, and calling Suicide Hotline.  Mordecai Rasmussen, MD 10/26/2021 6:11 PM

## 2021-10-26 NOTE — TOC Progression Note (Addendum)
Transition of Care (TOC) - Progression Note  ? ? ?Patient Details  ?Name: Karen [name] ?MRN: [id number] ?Date of Birth: [birth year] ? ?Transition of Care (TOC) CM/SW Contact  ?Pete Pelt, RN ?Phone Number: ?10/26/2021, 2:30 PM ? ?Clinical Narrative:   Patient's mother stated she was staying in room to prevent DSS worker from entering.  RNCM at bedside when [place] entered room.  Mother was refusing services, patient stated she would speak to DSS caseworker if [facility] stayed in room.  All consented to [facility] being present for discussion, mother left room. ? ?Patient stated she does not recall when she fell or what the circumstances were, but she does not fear for her safety or welfare if/when she returns home on discharge. Patient states that she has a good relationship with her mother and she looks forward to returning home. ? ?Mother, patient, RNCM and DSS caseworker Drucie Ip met following discussion with patient.  Mother states she cannot care for patient at home, and wants her to discharge to SNF.  RNCM and DSS caseworker spoke to patient and mother about recommendations from physical therapy, stating patient does not qualify for SNF. ? ?RNCM states that we will provide patient with home health, RN, PT OT and Social Work.  DSS caseworker Drucie Ip states that they will ensure there are additional resources to assist patient and mother with care at home.   ? ?Mother states patient does not have a bed, that she soiled her bed.  She is unable to move mattress out of room and EMS refused to move mattress.  RNCM states that EMS is only for Emergency Services and that they do not move furniture.  Patient states she would like to call a furniture store that can deliver a new bed and remove the old mattress.   ? ?TOC contact information provided to patient and mother, TOC to follow ?  ?Addendum:  1456 hours:  Corene Cornea from [employer] that this agency can accept and provide [place] for PT OT and  Social work services.  MD aware, will write orders. ? ? ?Expected Discharge Plan:  (Home with parents as caregivers) ?Barriers to Discharge: Continued Medical Work up ? ?Expected Discharge Plan and Services ?Expected Discharge Plan:  (Home with parents as caregivers) ?  ?Discharge Planning Services: CM Consult ?  ?Living arrangements for the past 2 months: [place] ?                ?  ?  ?  ?  ?  ?  ?  ?  ?  ?  ? ? ?Social Determinants of Health (SDOH) Interventions ?  ? ?Readmission Risk Interventions ?No flowsheet data found. ? ?

## 2021-10-27 DIAGNOSIS — R4182 Altered mental status, unspecified: Secondary | ICD-10-CM | POA: Diagnosis not present

## 2021-10-27 DIAGNOSIS — F061 Catatonic disorder due to known physiological condition: Secondary | ICD-10-CM | POA: Diagnosis not present

## 2021-10-27 DIAGNOSIS — R778 Other specified abnormalities of plasma proteins: Secondary | ICD-10-CM | POA: Diagnosis not present

## 2021-10-27 DIAGNOSIS — N390 Urinary tract infection, site not specified: Secondary | ICD-10-CM | POA: Diagnosis not present

## 2021-10-27 MED ORDER — ESCITALOPRAM OXALATE 10 MG PO TABS: 10.0000 mg | ORAL_TABLET | Freq: Every day | ORAL | 0 refills | Status: DC

## 2021-10-27 MED ORDER — OLANZAPINE 15 MG PO TBDP
15.0000 mg | ORAL_TABLET | Freq: Every day | ORAL | 0 refills | Status: DC
Start: 2021-10-27 — End: 2021-11-05

## 2021-10-27 NOTE — TOC Progression Note (Addendum)
Transition of Care (TOC) - Progression Note  ? ? ?Patient Details  ?Name: ADRIENA MANFRE ?MRN: [id number] ?Date of Birth: 08/18/1975 ? ?Transition of Care (TOC) CM/SW Contact  ?Pete Pelt, RN ?Phone Number: ?10/27/2021, 9:46 AM ? ?Clinical Narrative:   Patient discharging today.  Patient's mother will be in at noon, to see patient prior to discharge.  EMS will transport patient home at 1 pm per mother's request.  When asked, mother states patient is typically transported to appointments in [place]. ? ?[place], Bernardo Heater notified of discharge, will provide family with resources.  [place] notified of discharge, will provide home health for patient. ? ? ? ? ? ?Expected Discharge Plan:  (Home with parents as caregivers) ?Barriers to Discharge: Continued Medical Work up ? ?Expected Discharge Plan and Services ?Expected Discharge Plan:  (Home with parents as caregivers) ?  ?Discharge Planning Services: CM Consult ?  ?Living arrangements for the past 2 months: [place] ?Expected Discharge Date: 10/27/21               ?  ?  ?  ?  ?  ?  ?  ?  ?  ?  ? ? ?Social Determinants of Health (SDOH) Interventions ?  ? ?Readmission Risk Interventions ?No flowsheet data found. ? ?

## 2021-10-27 NOTE — Progress Notes (Signed)
Patient being discharged home. Discharge documents given to mother and went over with mother. Patient going home POV via EMS. ?

## 2021-10-27 NOTE — Progress Notes (Signed)
Nutrition Follow-up ? ?DOCUMENTATION CODES:  ? ?Non-severe (moderate) malnutrition in context of chronic illness ? ?INTERVENTION:  ? ?-Continue Boost Breeze po TID, each supplement provides 250 kcal and 9 grams of protein  ?-Continue MVI with minerals daily ? ?NUTRITION DIAGNOSIS:  ? ?Moderate Malnutrition related to chronic illness (psychiatric illness) as evidenced by mild fat depletion, moderate fat depletion, mild muscle depletion, moderate muscle depletion. ? ?Ongoing ? ?GOAL:  ? ?Patient will meet greater than or equal to 90% of their needs ? ?Progressing  ? ?MONITOR:  ? ?PO intake, Supplement acceptance, Labs, Weight trends, Skin, I & O's ? ?REASON FOR ASSESSMENT:  ? ?Consult ?Assessment of nutrition requirement/status ? ?ASSESSMENT:  ? ?Karen [name] is a 46 y.o. female with medical history significant of altered mental status, takes, psychosis, Ehlers-Danlos syndrome, fibromyalgia presenting by EMS for lying on the floor for last several days and not moving Per ED providers note.  Patient unable to provide history no family at bedside.  Patient has a history of psychotic episodes in the past patient has been noted to be urinating and defecating on herself per ED note.  The admission requested for urinary tract infection. ? ?2/27- ECT treatments initiated ?3/13- last ECT treatment ? ?Reviewed I/O's: +1.1 L x 24 hours and +8.1 L since 10/13/21 ? ?Pt continues with good appetite. Noted meal completions 100%. Pt is drinking Boost Breeze supplements.  ? ?Per TOC notes, plan to discharge home with home health services today.   ? ?Labs reviewed: CBGS: 124 (inpatient orders for glycemic control are none).   ? ?Diet Order:   ?Diet Order   ? ?       ?  Diet - low sodium heart healthy       ?  ?  Diet regular Room service appropriate? Yes; Fluid consistency: Thin  Diet effective now       ?  ? ?  ?  ? ?  ? ? ?EDUCATION NEEDS:  ? ?Not appropriate for education at this time ? ?Skin:  Skin Assessment: Reviewed RN  Assessment ? ?Last BM:  10/18/21 ? ?Height:  ? ?Ht Readings from Last 1 Encounters:  ?10/25/21 '4\' 11"'$  (1.499 m)  ? ? ?Weight:  ? ?Wt Readings from Last 1 Encounters:  ?10/17/21 60.9 kg  ? ? ?Ideal Body Weight:  52.3 kg ? ?BMI:  Body mass index is 27.12 kg/m?. ? ?Estimated Nutritional Needs:  ? ?Kcal:  1800-2000 ? ?Protein:  90-105 grams ? ?Fluid:  > 1.8 L ? ? ? ?Loistine Chance, RD, LDN, CDCES ?Registered Dietitian II ?Certified Diabetes Care and Education Specialist ?Please refer to [facility] for RD and/or RD on-call/weekend/after hours pager  ?

## 2021-10-27 NOTE — Consult Note (Signed)
[facility] Face-to-Face Psychiatry Consult  ? ?Reason for Consult: Follow-up consult on this 46 year old woman who has been in the hospital with catatonia receiving ECT treatment ?Referring Physician: Manuella Ghazi ?Patient Identification: Karen [name] ?MRN:  [id number] ?Principal Diagnosis: Catatonia ?Diagnosis:  Principal Problem: ?  Catatonia ?Active Problems: ?  Depression with anxiety ?  Seizure disorder ([place]) ?  Sepsis secondary to UTI [facility]) ?  AMS (altered mental status) ?  Thyroid nodule ?  Hypokalemia ?  Elevated troponin ?  Depression, major, recurrent, severe with psychosis ([place]) ?  Malnutrition of moderate degree ?  Encounter for nasogastric (NG) tube placement ?  Urinary tract infection without hematuria ? ? ?Total Time spent with patient: 45 minutes ? ?Subjective:   ?Karen [name] is a 46 y.o. female patient admitted with "I am feeling fine". ? ?HPI: Patient and patient's mother were both seen in person yesterday evening around 6:00.  Patient was awake attentive and in good spirits.  Denied any paranoia or hallucinations or psychotic symptoms.  Denied feeling depressed.  Expressed feeling more energetic.  Eating better.  Patient is looking forward to being able to go home.  Mother reports she is concerned about the patient's overall strength.  Says that the patient has lost a lot of muscle mass especially in her legs and is concerned that the patient may be at risk of falls.  Patient had 6 bilateral ECT treatments with very good recovery both of simple catatonic symptoms and of underlying psychosis and depression.  She has been treated with antidepressant and antipsychotic medicine as well which she is tolerating. ? ?Past Psychiatric History: Patient had been suffering from this condition probably for a half of a year or more.  Prior to that no really specific psychiatric treatment that can be differentiated from her past history of head injury ? ?Risk to Self:   ?Risk to Others:   ?Prior Inpatient Therapy:    ?Prior Outpatient Therapy:   ? ?Past Medical History:  ?Past Medical History:  ?Diagnosis Date  ? Ehlers-Danlos syndrome   ? Fibromyalgia   ? Headache   ? Sleep apnea   ? Tics of organic origin   ? Transient alteration of awareness   ?  ?Past Surgical History:  ?Procedure Laterality Date  ? COLONOSCOPY WITH PROPOFOL N/A 02/13/2018  ? Procedure: COLONOSCOPY WITH PROPOFOL;  Surgeon: [name], MD;  Location: [facility] ENDOSCOPY;  Service: Endoscopy;  Laterality: N/A;  ? [place] OF UTERUS  2009  ? ESOPHAGOGASTRODUODENOSCOPY (EGD) WITH PROPOFOL  02/13/2018  ? Procedure: ESOPHAGOGASTRODUODENOSCOPY (EGD) WITH PROPOFOL;  Surgeon: [name], MD;  Location: [facility] ENDOSCOPY;  Service: Endoscopy;;  ? EYE SURGERY Left 1990  ? 3 Surgeries on left eye to correct crossed eye  ? LAPAROSCOPY Left 2003  ? Fallopian Tube  ? [place]  ? OTHER SURGICAL HISTORY Right 1987  ? 3 Surgeries to repair broken arm  ? OTHER SURGICAL HISTORY Left   ? 3 surgeries on her left thigh as an infant  ? OTHER SURGICAL HISTORY  1998  ? Jaw surgery  ? Right arm surgery  1987  ? x 3 due to fracture  ? TOENAIL EXCISION Bilateral 06/13/2019  ? Procedure: BILATERAL SECOND TOE PARTIAL NAIL ABLATION;  Surgeon: Erle Crocker, MD;  Location: [place];  Service: Orthopedics;  Laterality: Bilateral;  SURGERY REQUEST TIME 1 HOUR  ? TONSILLECTOMY  12/13/2002  ? ?Family History:  ?Family History  ?Problem Relation Age of Onset  ?  Depression Mother   ? Stroke Mother   ? Fibromyalgia Mother   ? Osteoarthritis Mother   ? Asthma Brother   ? Depression Brother   ? Migraines Brother   ? Asperger's syndrome Brother   ? Other Brother   ?     Ehlers-Danlos Syndrome  ? Cancer Maternal Aunt   ? Asperger's syndrome Maternal Aunt   ? Breast cancer Maternal Aunt   ? Heart disease Paternal Aunt   ? Heart disease Paternal Uncle   ? Cervical cancer Maternal Grandmother   ? Osteoporosis Maternal Grandmother   ?     Died at 53  ?  Osteoarthritis Maternal Grandmother   ? Colon cancer Maternal Grandfather   ? Asthma Maternal Grandfather   ? Asperger's syndrome Maternal Grandfather   ? Emphysema Maternal Grandfather   ?     Died at 28  ? Prostate cancer Maternal Grandfather   ? Heart attack Paternal Grandfather   ?     Died at 75  ? Asthma Paternal Grandfather   ? Tuberculosis Paternal Grandfather   ? Cancer Cousin   ? Asperger's syndrome Cousin   ? Asperger's syndrome Other   ? Heart Problems Paternal Grandmother   ?     Died at 42  ? Ehlers-Danlos syndrome Father   ? Ehlers-Danlos syndrome Brother   ? ?Family Psychiatric  History: None reported ?Social History:  ?Social History  ? ?Substance and Sexual Activity  ?Alcohol Use No  ? Alcohol/week: 0.0 standard drinks  ?   ?Social History  ? ?Substance and Sexual Activity  ?Drug Use No  ?  ?Social History  ? ?Socioeconomic History  ? Marital status: Single  ?  Spouse name: Not on file  ? Number of children: Not on file  ? Years of education: Not on file  ? Highest education level: Not on file  ?Occupational History  ? Not on file  ?Tobacco Use  ? Smoking status: Never  ? Smokeless tobacco: Never  ?Vaping Use  ? Vaping Use: Never used  ?Substance and Sexual Activity  ? Alcohol use: No  ?  Alcohol/week: 0.0 standard drinks  ? Drug use: No  ? Sexual activity: Never  ?Other Topics Concern  ? Not on file  ?Social History Narrative  ? Jaliyah is 69.  ? 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   ? She lives with her mother.   ? She enjoys reading, watching TV, writing, and painting.   ? ?Social Determinants of Health  ? ?Financial Resource Strain: Not on file  ?Food Insecurity: Not on file  ?Transportation Needs: Not on file  ?Physical Activity: Not on file  ?Stress: Not on file  ?Social Connections: Not on file  ? ?Additional Social History: ?  ? ?Allergies:   ?Allergies  ?Allergen Reactions  ? Augmentin  [Amoxicillin-Pot Clavulanate] Diarrhea  ? Chocolate Flavor   ?  Other reaction(s): Other (See  Comments) ?Wheezing, acne  ? Demerol [Meperidine] Other (See Comments)  ?  Slow to wake up when this drug is given.   ? Keflex [Cephalexin] Nausea And Vomiting  ? Morphine And Related Nausea And Vomiting  ? Tape Dermatitis  ?  Must use paper tape only  ? Toradol [Ketorolac Tromethamine] Nausea And Vomiting  ? Amoxicillin   ?  Other reaction(s): Unknown  ? Chocolate   ?  GI distress  ? Nsaids   ?  patient develops ulcers ?Other reaction(s): Other (See Comments) ?patient develops ulcers  ? Strawberry  Extract   ?  GI distress  ? ? ?Labs:  ?Results for orders placed or performed during the hospital encounter of 10/04/21 (from the past 48 hour(s))  ?Basic metabolic panel     Status: Abnormal  ? Collection Time: 10/26/21  5:13 AM  ?Result Value Ref Range  ? Sodium 140 135 - 145 mmol/L  ? Potassium 3.6 3.5 - 5.1 mmol/L  ? Chloride 106 98 - 111 mmol/L  ? CO2 27 22 - 32 mmol/L  ? Glucose, Bld 112 (H) 70 - 99 mg/dL  ?  Comment: Glucose reference range applies only to samples taken after fasting for at least 8 hours.  ? BUN 24 (H) 6 - 20 mg/dL  ? Creatinine, Ser 0.63 0.44 - 1.00 mg/dL  ? Calcium 8.8 (L) 8.9 - 10.3 mg/dL  ? GFR, Estimated >60 >60 mL/min  ?  Comment: (NOTE) ?Calculated using the CKD-EPI Creatinine Equation (2021) ?  ? Anion gap 7 5 - 15  ?  Comment: Performed at [facility], [address] [place], [place] [address]  ? ? ?Current Facility-Administered Medications  ?Medication Dose Route Frequency Provider Last Rate Last Admin  ? 0.9 %  sodium chloride infusion   Intravenous PRN Fritzi Mandes, MD   Stopped at 10/25/21 1354  ? acetaminophen (TYLENOL) tablet 650 mg  650 mg Oral Q6H PRN Para Skeans, MD   650 mg at 10/26/21 2203  ? Or  ? acetaminophen (TYLENOL) suppository 650 mg  650 mg Rectal Q6H PRN Para Skeans, MD      ? albuterol (PROVENTIL) (2.5 MG/3ML) 0.083% nebulizer solution 2.5 mg  2.5 mg Nebulization Q6H PRN Renda Rolls, RPH      ? baclofen (LIORESAL) tablet 5 mg  5 mg Oral BID PRN  Richarda Osmond, MD   5 mg at 10/23/21 2005  ? enoxaparin (LOVENOX) injection 40 mg  40 mg Subcutaneous Q24H Fritzi Mandes, MD   40 mg at 10/26/21 2204  ? escitalopram (LEXAPRO) tablet 10 mg  10 mg Oral Daily Koray Soter T

## 2021-10-28 ENCOUNTER — Telehealth: Payer: Self-pay | Admitting: Family Medicine

## 2021-10-28 ENCOUNTER — Telehealth: Payer: Self-pay

## 2021-10-28 NOTE — Telephone Encounter (Signed)
Please advise. Patient is scheduled for a hospital follow up on 11/15/2021. It looks like hospital RN called and made this appt. Patient's mom is calling wanting patient to be seen sooner. Please advise if you need to see patient sooner. If so, when.  ? ?A separate message was also sent back. Patients mom wants a call back to discuss  which medications patient should be on.  ?

## 2021-10-28 NOTE — Telephone Encounter (Signed)
Copied from [place] [id number]. Topic: General - Other ?>> Oct 28, 2021  4:14 PM Bayard Beaver wrote: ?Reason for CRM:pt called in to get earlier appt for hosp fu before April 3. Please call back ?

## 2021-10-28 NOTE — Telephone Encounter (Signed)
Pt's mother called in wants to discuss meds that pt should be on ?

## 2021-10-28 NOTE — Telephone Encounter (Signed)
Appt scheduled

## 2021-10-28 NOTE — Telephone Encounter (Signed)
She can come Friday March 24th at 3:40 ?

## 2021-10-29 ENCOUNTER — Telehealth: Payer: Self-pay

## 2021-10-29 DIAGNOSIS — E041 Nontoxic single thyroid nodule: Secondary | ICD-10-CM | POA: Diagnosis not present

## 2021-10-29 DIAGNOSIS — Z993 Dependence on wheelchair: Secondary | ICD-10-CM | POA: Diagnosis not present

## 2021-10-29 DIAGNOSIS — F333 Major depressive disorder, recurrent, severe with psychotic symptoms: Secondary | ICD-10-CM | POA: Diagnosis not present

## 2021-10-29 DIAGNOSIS — E440 Moderate protein-calorie malnutrition: Secondary | ICD-10-CM | POA: Diagnosis not present

## 2021-10-29 DIAGNOSIS — G4730 Sleep apnea, unspecified: Secondary | ICD-10-CM | POA: Diagnosis not present

## 2021-10-29 DIAGNOSIS — F419 Anxiety disorder, unspecified: Secondary | ICD-10-CM | POA: Diagnosis not present

## 2021-10-29 DIAGNOSIS — Z9181 History of falling: Secondary | ICD-10-CM | POA: Diagnosis not present

## 2021-10-29 DIAGNOSIS — M797 Fibromyalgia: Secondary | ICD-10-CM | POA: Diagnosis not present

## 2021-10-29 DIAGNOSIS — Q796 Ehlers-Danlos syndrome, unspecified: Secondary | ICD-10-CM | POA: Diagnosis not present

## 2021-10-29 DIAGNOSIS — Z87440 Personal history of urinary (tract) infections: Secondary | ICD-10-CM | POA: Diagnosis not present

## 2021-10-29 DIAGNOSIS — G40909 Epilepsy, unspecified, not intractable, without status epilepticus: Secondary | ICD-10-CM | POA: Diagnosis not present

## 2021-10-29 DIAGNOSIS — Z6821 Body mass index (BMI) 21.0-21.9, adult: Secondary | ICD-10-CM | POA: Diagnosis not present

## 2021-10-29 DIAGNOSIS — R627 Adult failure to thrive: Secondary | ICD-10-CM | POA: Diagnosis not present

## 2021-10-29 DIAGNOSIS — F061 Catatonic disorder due to known physiological condition: Secondary | ICD-10-CM | POA: Diagnosis not present

## 2021-10-29 DIAGNOSIS — F958 Other tic disorders: Secondary | ICD-10-CM | POA: Diagnosis not present

## 2021-10-29 NOTE — Discharge Summary (Signed)
?Physician Discharge Summary ?  ?Patient: Karen [name] MRN: [id number] DOB: 04-04-1976  ?Admit date:     10/04/2021  ?Discharge date: 10/27/21  ?Discharge Physician: Max Sane  ? ?PCP: Birdie Sons, MD  ? ?Recommendations at discharge:  ? ?Follow-up with outpatient providers as requested ? ?Discharge Diagnoses: ?Principal Problem: ?  Catatonia ?Active Problems: ?  Depression with anxiety ?  Seizure disorder ([place]) ?  Sepsis secondary to UTI [facility]) ?  AMS (altered mental status) ?  Thyroid nodule ?  Hypokalemia ?  Elevated troponin ?  Depression, major, recurrent, severe with psychosis ([place]) ?  Malnutrition of moderate degree ?  Encounter for nasogastric (NG) tube placement ?  Urinary tract infection without hematuria ? ? ?Assessment and Plan: ? ?Karen [name] is a 46 y.o. female with a PMH significant for Ehler's Danlos syndrome, fibromyalgia, sleep apnea, transient alteration of awareness, tics of organic origin, headaches. ?They presented from home to the ED on 10/04/2021 with found down x several days days.  She was found lying on the floor covered with urine and feces ?In the ED, it was found that they had UTI and met sepsis criteria with, lactic acidosis.  ?They were treated with ceftriaxone and IV fluids.  ?Patient was admitted to medicine service for further workup and management of AMS as outlined in detail below. ?  ?10/26/21 -medically stable.  Much more talkative and interactive today. ?   ?History of paranoid psychosis with hallucinations and delusions/catatonia ?Severe major depression and anxiety-patient presented in catatonic state and has shown significant improvement after multiple ECT sessions.  ?-Psychiatry recommends outpatient follow-up with psychiatry.  Patient has been set up to see [facility].  No further ECT need for now ?-Psych recommends citalopram and olanzapine at discharge ?  ?Hypokalemia -repleted and resolved ?  ?Sepsis due to UTI-resolved with  treatment ?  ?Elevated troponins on admission-likely from demand ischemia.  No cardiac history, unable to assess if she had chest pain, echo unremarkable. Cardiology evaluated ?  ?Failure to thrive-improving.  Patient is now eating and drinking well. ?Patient has poor nutrition status due to poor PO intake due to catatonia.   ?Down approximately 40 pounds from her weight prior to her current illness.  ?NG tube was attempted earlier this admission but patient did not tolerate. Blood sugars have remained stable.  ?She has responded well to ECT treatments, now has adequate p.o. intake and labs not showing signs of significant dehydration or electrolyte abnormalities at this point. ?  ?Thyroid nodule- thyroid ultrasound Diffusely heterogeneous and enlarged thyroid gland most consistent with diffuse goitrous change. ? - Recommend follow-up ultrasound in 1 year ?  ?Body mass index is 27.12 kg/m?. ? ?  ? ? ?Consultants: Psychiatry ?Procedures performed: ECT ?Disposition: Home ?Diet recommendation:  ?Discharge Diet Orders (From admission, onward)  ? ?  Start     Ordered  ? 10/27/21 0000  Diet - low sodium heart healthy       ? 10/27/21 0910  ? ?  ?  ? ?  ? ?Carb modified diet ?DISCHARGE MEDICATION: ?Allergies as of 10/27/2021   ? ?   Reactions  ? Augmentin  [amoxicillin-pot Clavulanate] Diarrhea  ? Chocolate Flavor   ? Other reaction(s): Other (See Comments) ?Wheezing, acne  ? Demerol [meperidine] Other (See Comments)  ? Slow to wake up when this drug is given.   ? Keflex [cephalexin] Nausea And Vomiting  ? Morphine And Related Nausea And Vomiting  ? Tape  Dermatitis  ? Must use paper tape only  ? Toradol [ketorolac Tromethamine] Nausea And Vomiting  ? Amoxicillin   ? Other reaction(s): Unknown  ? Chocolate   ? GI distress  ? Nsaids   ? patient develops ulcers ?Other reaction(s): Other (See Comments) ?patient develops ulcers  ? Strawberry Extract   ? GI distress  ? ?  ? ?  ?Medication List  ?  ? ?TAKE these medications    ? ?albuterol 108 (90 Base) MCG/ACT inhaler ?Commonly known as: Ventolin HFA ?Inhale 2 puffs into the lungs every 6 (six) hours as needed. ?  ?azelastine 0.05 % ophthalmic solution ?Commonly known as: OPTIVAR ?Apply to eye as directed. 1 drop in each eye prn ?  ?busPIRone 10 MG tablet ?Commonly known as: BUSPAR ?Take 1 tablet (10 mg total) by mouth 2 (two) times daily. Take along with 67m tablet twice a day ?  ?busPIRone 15 MG tablet ?Commonly known as: BUSPAR ?TAKE 1 TABLET BY MOUTH TWICE A DAY ?  ?celecoxib 200 MG capsule ?Commonly known as: CELEBREX ?TAKE 1 CAPSULE BY MOUTH TWICE A DAY ?  ?Clenpiq 10-3.5-12 MG-GM -GM/160ML Soln ?Generic drug: Sod Picosulfate-Mag Ox-Cit Acd ?Take 320 mLs by mouth as directed. ?  ?cyclobenzaprine 5 MG tablet ?Commonly known as: FLEXERIL ?Take 1 tablet (5 mg total) by mouth 3 (three) times daily as needed for muscle spasms. Do not take within 6 hours of taking tizandine ?  ?diclofenac Sodium 1 % Gel ?Commonly known as: VOLTAREN ?APPLY AS NEEDED 3 TIMES A DAY ?  ?DULoxetine 60 MG capsule ?Commonly known as: CYMBALTA ?TAKE 2 CAPSULES BY MOUTH EVERY DAY ?  ?escitalopram 10 MG tablet ?Commonly known as: LEXAPRO ?Take 1 tablet (10 mg total) by mouth daily. ?  ?fentaNYL 100 MCG/HR ?Commonly known as: [place]?Place 1 patch onto the skin every 3 (three) days. ?  ?fexofenadine 180 MG tablet ?Commonly known as: ALLEGRA ?Take 180 mg by mouth daily. ?  ?fluticasone 110 MCG/ACT inhaler ?Commonly known as: FLOVENT HFA ?Inhale 2 puffs into the lungs 2 (two) times daily. ?  ?fluticasone 50 MCG/ACT nasal spray ?Commonly known as: FLONASE ?  ?Fluticasone-Salmeterol 250-50 MCG/DOSE Aepb ?Commonly known as: ADVAIR ?Inhale 1 puff into the lungs every 12 (twelve) hours. Rinse mouth after use ?  ?gabapentin 600 MG tablet ?Commonly known as: NEURONTIN ?TAKE 2 TABLETS IN THE MORNING AND 3 TABLETS AT BEDTIME ?  ?haloperidol 1 MG tablet ?Commonly known as: HALDOL ?Take 1 mg by mouth 2 (two) times daily.  Patient states she should be and has been on 5 mg twice daily. ?  ?haloperidol 0.5 MG tablet ?Commonly known as: HALDOL ?Take 0.5 tablets (0.25 mg total) by mouth 2 (two) times daily. ?  ?naloxone 4 MG/0.1ML Liqd nasal spray kit ?Commonly known as: NARCAN ?Place 1 spray into the nose as needed. ?  ?OLANZapine zydis 15 MG disintegrating tablet ?Commonly known as: ZYPREXA ?Take 1 tablet (15 mg total) by mouth at bedtime. ?  ?omeprazole 40 MG capsule ?Commonly known as: PRILOSEC ?Take 1 capsule (40 mg total) by mouth 2 (two) times daily. ?  ?ondansetron 4 MG disintegrating tablet ?Commonly known as: ZOFRAN-ODT ?DISSOLVE 1 TABLET IN MOUTH EVERY 8 HOURS FOR NAUSEA AND VOMITING ?  ?pregabalin 300 MG capsule ?Commonly known as: LYRICA ?Take 1 capsule (300 mg total) by mouth 2 (two) times daily. ?  ?QUEtiapine 200 MG tablet ?Commonly known as: SEROQUEL ?TAKE 1 TABLET BY MOUTH AT BEDTIME. ?  ?tamsulosin 0.4 MG Caps  capsule ?Commonly known as: FLOMAX ?Take 1 capsule (0.4 mg total) by mouth daily. ?  ?tiZANidine 4 MG tablet ?Commonly known as: ZANAFLEX ?TAKE 0.5-1 TABLETS (2-4 MG TOTAL) BY MOUTH EVERY 8 (EIGHT) HOURS AS NEEDED FOR MUSCLE SPASMS. ?  ? ?  ? ? Follow-up Information   ? ? Birdie Sons, MD. Go on 11/15/2021.   ?Specialty: Family Medicine ?Why: '@10' :00am ?Contact information: ?[address] ?[address] ?[place] [address] ?[phone] ? ? ?  ?  ? ? Orlene Erm, MD. Go on 11/25/2021.   ?Specialty: Child and Adolescent Psychiatry ?Why: '@9' :00am; please arrive at 8:20am ?Contact information: ?[address] ?[place] [address] ?[phone] ? ? ?  ?  ? ?  ?  ? ?  ? ?Discharge Exam: ?Filed Weights  ? 10/04/21 1900 10/17/21 0500  ?Weight: 60.8 kg 60.9 kg  ? ?General: Awake and alert, resting in bed, no acute distress, conversational ?Respiratory: normal respiratory effort, on room air ?Cardiovascular: Regular rate and rhythm, no peripheral edema ?Nervous: Normal speech, grossly nonfocal  exam ?Skin: dry, intact, no rashes seen ?Psych: Normal mood, flat affect, interactive and conversational ? ?Condition at discharge: fair ? ?The results of significant diagnostics from this hospitalization (including

## 2021-10-29 NOTE — Telephone Encounter (Signed)
Copied from [place] [id number]. Topic: Quick Communication - Home Health Verbal Orders ?>> Oct 29, 2021  3:50 PM Pawlus, Brayton Layman A wrote: ?Caller/Agency: [facility]  ?Callback Number: [id number] ?Requesting: PT ?Frequency: 1x1, 2x3, 1x5 ?

## 2021-10-29 NOTE — Telephone Encounter (Signed)
That's fine

## 2021-11-01 ENCOUNTER — Telehealth: Payer: Self-pay | Admitting: *Deleted

## 2021-11-01 NOTE — Telephone Encounter (Signed)
HHRN [facility] calling back regarding [place] orders. Return call from 10/29/21 states that PT request is okay with Dr. Caryn Section. Verbalized understanding. ?

## 2021-11-02 DIAGNOSIS — E440 Moderate protein-calorie malnutrition: Secondary | ICD-10-CM | POA: Diagnosis not present

## 2021-11-02 DIAGNOSIS — F333 Major depressive disorder, recurrent, severe with psychotic symptoms: Secondary | ICD-10-CM | POA: Diagnosis not present

## 2021-11-02 DIAGNOSIS — R627 Adult failure to thrive: Secondary | ICD-10-CM | POA: Diagnosis not present

## 2021-11-02 DIAGNOSIS — Q796 Ehlers-Danlos syndrome, unspecified: Secondary | ICD-10-CM | POA: Diagnosis not present

## 2021-11-02 DIAGNOSIS — M797 Fibromyalgia: Secondary | ICD-10-CM | POA: Diagnosis not present

## 2021-11-02 DIAGNOSIS — F419 Anxiety disorder, unspecified: Secondary | ICD-10-CM | POA: Diagnosis not present

## 2021-11-02 NOTE — Telephone Encounter (Signed)
Tried [employer]. Left message to call back. OK for [facility] triage to advise.  ?

## 2021-11-02 NOTE — Telephone Encounter (Signed)
[facility] advised. ?

## 2021-11-03 ENCOUNTER — Telehealth: Payer: Self-pay | Admitting: Family Medicine

## 2021-11-03 NOTE — Telephone Encounter (Signed)
Tried returning her call. Left detailed message on secure voice message system.  ?

## 2021-11-03 NOTE — Telephone Encounter (Signed)
Karen [name] calling from [place] would like to get a verbal for home health Nursing.  ?1x8 ?2 PRN  ?Please advise  ?

## 2021-11-03 NOTE — Telephone Encounter (Signed)
That's fine

## 2021-11-04 DIAGNOSIS — M797 Fibromyalgia: Secondary | ICD-10-CM | POA: Diagnosis not present

## 2021-11-04 DIAGNOSIS — F419 Anxiety disorder, unspecified: Secondary | ICD-10-CM | POA: Diagnosis not present

## 2021-11-04 DIAGNOSIS — F333 Major depressive disorder, recurrent, severe with psychotic symptoms: Secondary | ICD-10-CM | POA: Diagnosis not present

## 2021-11-04 DIAGNOSIS — R627 Adult failure to thrive: Secondary | ICD-10-CM | POA: Diagnosis not present

## 2021-11-04 DIAGNOSIS — Q796 Ehlers-Danlos syndrome, unspecified: Secondary | ICD-10-CM | POA: Diagnosis not present

## 2021-11-04 DIAGNOSIS — E440 Moderate protein-calorie malnutrition: Secondary | ICD-10-CM | POA: Diagnosis not present

## 2021-11-05 ENCOUNTER — Ambulatory Visit (INDEPENDENT_AMBULATORY_CARE_PROVIDER_SITE_OTHER): Payer: Medicare Other | Admitting: Family Medicine

## 2021-11-05 ENCOUNTER — Encounter: Payer: Self-pay | Admitting: Family Medicine

## 2021-11-05 ENCOUNTER — Other Ambulatory Visit: Payer: Self-pay

## 2021-11-05 VITALS — BP 102/75 | HR 93 | Temp 99.0°F | Resp 16

## 2021-11-05 DIAGNOSIS — E041 Nontoxic single thyroid nodule: Secondary | ICD-10-CM

## 2021-11-05 DIAGNOSIS — M549 Dorsalgia, unspecified: Secondary | ICD-10-CM

## 2021-11-05 DIAGNOSIS — R12 Heartburn: Secondary | ICD-10-CM | POA: Diagnosis not present

## 2021-11-05 DIAGNOSIS — Q796 Ehlers-Danlos syndrome, unspecified: Secondary | ICD-10-CM | POA: Diagnosis not present

## 2021-11-05 DIAGNOSIS — E440 Moderate protein-calorie malnutrition: Secondary | ICD-10-CM | POA: Diagnosis not present

## 2021-11-05 DIAGNOSIS — M069 Rheumatoid arthritis, unspecified: Secondary | ICD-10-CM | POA: Diagnosis not present

## 2021-11-05 DIAGNOSIS — M797 Fibromyalgia: Secondary | ICD-10-CM | POA: Diagnosis not present

## 2021-11-05 DIAGNOSIS — G8929 Other chronic pain: Secondary | ICD-10-CM

## 2021-11-05 DIAGNOSIS — F29 Unspecified psychosis not due to a substance or known physiological condition: Secondary | ICD-10-CM

## 2021-11-05 DIAGNOSIS — F061 Catatonic disorder due to known physiological condition: Secondary | ICD-10-CM | POA: Diagnosis not present

## 2021-11-05 DIAGNOSIS — F331 Major depressive disorder, recurrent, moderate: Secondary | ICD-10-CM | POA: Diagnosis not present

## 2021-11-05 MED ORDER — OMEPRAZOLE 40 MG PO CPDR: 40.0000 mg | DELAYED_RELEASE_CAPSULE | Freq: Every day | ORAL | Status: DC

## 2021-11-05 MED ORDER — GABAPENTIN 600 MG PO TABS: 600.0000 mg | ORAL_TABLET | Freq: Two times a day (BID) | ORAL | Status: DC

## 2021-11-05 MED ORDER — FENTANYL 50 MCG/HR TD PT72: 1.0000 | MEDICATED_PATCH | TRANSDERMAL | 0 refills | Status: DC

## 2021-11-05 NOTE — Progress Notes (Signed)
?  ? ?I,Sulibeya S Dimas,acting as a scribe for Lelon Huh, MD.,have documented all relevant documentation on the behalf of Lelon Huh, MD,as directed by  Lelon Huh, MD while in the presence of Lelon Huh, MD. ? ? ?Established patient visit ? ? ?Patient: Karen [name]   DOB: 1976-05-05   46 y.o. Female  MRN: [id number] ?Visit Date: 11/05/2021 ? ?Today's healthcare provider: Lelon Huh, MD  ? ?Chief Complaint  ?Patient presents with  ? Hospitalization Follow-up  ? ?Subjective  ?  ?HPI  ?Follow up Hospitalization ? ?Patient is here today with her mother. They reports patient has been off medications for about 6 months. Patient reports that due to "Psychosis" she was not taking medications. Patients mother reports that she was not aware at first that patient was not taking medications. Mother started noticing that patients bottle were full and then started to give medications to patients. However she reports that patient would trow the medications away.  ? ?Patient was admitted to [facility] on 10/04/2021 and discharged on 10/27/2021. ?She was treated for Catatonia. ?Treatment for this included treated with Ceftriaxone and IV Fluids.Multiple ECT Sessions.  ?Telephone follow up was done on n/a ?She reports poor compliance with treatment. ?She reports this condition is improved. ?Psych recommended Citalopram and Olanzapine at discharge, however she has been able to find olanzapine at any pharmacy in town and has never started. She has been back on '25mg'$  Seroquel, although she was taking 300 prior to events of last 8 months.  ? ?She has a long history of chronic pain, seizures,  tics, muscle spasms and depression related to RA, EDS, and remote traumatic head injury with extensive polypharmacy that she had been stable on for several years. She developed transient psychosis in last August of last year for which she was hospitalized for a week at [place] in late August of last year. The patient's  mother states that has since become aware that the patient stopped taking all of her chronic medications, including several psychotropic medications, after she was discharged home. Symptoms rebounded within a week and was found to have UTI when she was briefly admitted to [facility] for catatonia on 04/22/2022. She subsequently developed recurrent episodes of paranoia, catatonia, and psychosis leading to multiple ER visits and psychiatric admissions. She was referred to outpatient psychiatry but was never able to get for evaluation apparently due her patient being uncooperative for transportation, which is very difficult and sometimes impossible due to her being wheelchair bound. She continued to decline until she was admitted to [facility] medication on 10/04/2021 malnourished, dehydrated and in an intractable catatonic state. After several ECT treatments she emerged from her catatonic states and was back to her psychiatric baseline when discharged on 10/27/2021.  ? ?She apparently lost 30 or 40 pounds over the last 6 months, but since discharge she has been eating and drinking very well.  She has been off of gabapentin, Cymbalta, buspirone, lyrica and all of her pain medications since discharge. She was started on low dose escitalopram prior to discharge which she has been taking. She was discharged on '25mg'$  quetiapine at discharge which was a dose reduction from '300mg'$  that she was taking prior to psychotic episodes. She was prescribed olanzapine as states above, but she was not able to find medication at any pharmacy in town and has not had any paranoia, delusions, catatonia, or any other psychotic symptoms since discharge.  ? ?She generally feels back to her baseline with the exception  of having severe constant pain since she has been off of all of her chronic pain medications. She states her back pains are killing and has all of her medications at home, but does not want to start taking them until we come up with a  long term medication plan.  ?----------------------------------------------------------------------------------------- -  ? ?Medications: ?Outpatient Medications Prior to Visit  ?Medication Sig  ? escitalopram (LEXAPRO) 10 MG tablet Take 1 tablet (10 mg total) by mouth daily.  ? QUEtiapine (SEROQUEL) 25 MG tablet Take 25 mg by mouth at bedtime.  ? albuterol (VENTOLIN HFA) 108 (90 Base) MCG/ACT inhaler Inhale 2 puffs into the lungs every 6 (six) hours as needed. (Patient not taking: Reported on 11/05/2021)  ? azelastine (OPTIVAR) 0.05 % ophthalmic solution Apply to eye as directed. 1 drop in each eye prn (Patient not taking: Reported on 11/05/2021)  ? busPIRone (BUSPAR) 10 MG tablet Take 1 tablet (10 mg total) by mouth 2 (two) times daily. Take along with '15mg'$  tablet twice a day (Patient not taking: Reported on 11/05/2021)  ? busPIRone (BUSPAR) 15 MG tablet TAKE 1 TABLET BY MOUTH TWICE A DAY (Patient not taking: Reported on 11/05/2021)  ? celecoxib (CELEBREX) 200 MG capsule TAKE 1 CAPSULE BY MOUTH TWICE A DAY (Patient not taking: Reported on 11/05/2021)  ? cyclobenzaprine (FLEXERIL) 5 MG tablet Take 1 tablet (5 mg total) by mouth 3 (three) times daily as needed for muscle spasms. Do not take within 6 hours of taking tizandine (Patient not taking: Reported on 11/05/2021)  ? diclofenac Sodium (VOLTAREN) 1 % GEL APPLY AS NEEDED 3 TIMES A DAY (Patient not taking: Reported on 11/05/2021)  ? DULoxetine (CYMBALTA) 60 MG capsule TAKE 2 CAPSULES BY MOUTH EVERY DAY (Patient not taking: Reported on 11/05/2021)  ? fentaNYL (DURAGESIC) 100 MCG/HR Place 1 patch onto the skin every 3 (three) days. (Patient not taking: Reported on 11/05/2021)  ? fexofenadine (ALLEGRA) 180 MG tablet Take 180 mg by mouth daily. (Patient not taking: Reported on 11/05/2021)  ? fluticasone (FLONASE) 50 MCG/ACT nasal spray  (Patient not taking: Reported on 11/05/2021)  ? fluticasone (FLOVENT HFA) 110 MCG/ACT inhaler Inhale 2 puffs into the lungs 2 (two) times daily.  (Patient not taking: Reported on 11/05/2021)  ? Fluticasone-Salmeterol (ADVAIR) 250-50 MCG/DOSE AEPB Inhale 1 puff into the lungs every 12 (twelve) hours. Rinse mouth after use (Patient not taking: Reported on 11/05/2021)  ? gabapentin (NEURONTIN) 600 MG tablet TAKE 2 TABLETS IN THE MORNING AND 3 TABLETS AT BEDTIME (Patient not taking: Reported on 11/05/2021)  ? haloperidol (HALDOL) 0.5 MG tablet Take 0.5 tablets (0.25 mg total) by mouth 2 (two) times daily. (Patient not taking: Reported on 11/05/2021)  ? haloperidol (HALDOL) 1 MG tablet Take 1 mg by mouth 2 (two) times daily. Patient states she should be and has been on 5 mg twice daily. (Patient not taking: Reported on 11/05/2021)  ? naloxone (NARCAN) nasal spray 4 mg/0.1 mL Place 1 spray into the nose as needed. (Patient not taking: Reported on 11/05/2021)  ? OLANZapine zydis (ZYPREXA) 15 MG disintegrating tablet Take 1 tablet (15 mg total) by mouth at bedtime. (Patient not taking: Reported on 11/05/2021)  ? omeprazole (PRILOSEC) 40 MG capsule Take 1 capsule (40 mg total) by mouth 2 (two) times daily. (Patient not taking: Reported on 11/05/2021)  ? ondansetron (ZOFRAN-ODT) 4 MG disintegrating tablet DISSOLVE 1 TABLET IN MOUTH EVERY 8 HOURS FOR NAUSEA AND VOMITING (Patient not taking: Reported on 11/05/2021)  ? pregabalin (LYRICA) 300 MG  capsule Take 1 capsule (300 mg total) by mouth 2 (two) times daily. (Patient not taking: Reported on 11/05/2021)  ? QUEtiapine (SEROQUEL) 200 MG tablet TAKE 1 TABLET BY MOUTH AT BEDTIME. (Patient not taking: Reported on 11/05/2021)  ? Sod Picosulfate-Mag Ox-Cit Acd (CLENPIQ) 10-3.5-12 MG-GM -GM/160ML SOLN Take 320 mLs by mouth as directed. (Patient not taking: Reported on 11/05/2021)  ? tamsulosin (FLOMAX) 0.4 MG CAPS capsule Take 1 capsule (0.4 mg total) by mouth daily. (Patient not taking: Reported on 11/05/2021)  ? tiZANidine (ZANAFLEX) 4 MG tablet TAKE 0.5-1 TABLETS (2-4 MG TOTAL) BY MOUTH EVERY 8 (EIGHT) HOURS AS NEEDED FOR MUSCLE SPASMS.  (Patient not taking: Reported on 11/05/2021)  ? ?No facility-administered medications prior to visit.  ? ? ?Review of Systems  ?Constitutional:  Positive for activity change, appetite change and fatig

## 2021-11-08 DIAGNOSIS — F419 Anxiety disorder, unspecified: Secondary | ICD-10-CM | POA: Diagnosis not present

## 2021-11-08 DIAGNOSIS — M797 Fibromyalgia: Secondary | ICD-10-CM | POA: Diagnosis not present

## 2021-11-08 DIAGNOSIS — Q796 Ehlers-Danlos syndrome, unspecified: Secondary | ICD-10-CM | POA: Diagnosis not present

## 2021-11-08 DIAGNOSIS — R627 Adult failure to thrive: Secondary | ICD-10-CM | POA: Diagnosis not present

## 2021-11-08 DIAGNOSIS — F333 Major depressive disorder, recurrent, severe with psychotic symptoms: Secondary | ICD-10-CM | POA: Diagnosis not present

## 2021-11-08 DIAGNOSIS — E440 Moderate protein-calorie malnutrition: Secondary | ICD-10-CM | POA: Diagnosis not present

## 2021-11-09 ENCOUNTER — Telehealth: Payer: Self-pay

## 2021-11-09 ENCOUNTER — Encounter: Payer: Self-pay | Admitting: Family Medicine

## 2021-11-09 DIAGNOSIS — M797 Fibromyalgia: Secondary | ICD-10-CM | POA: Diagnosis not present

## 2021-11-09 DIAGNOSIS — Z87820 Personal history of traumatic brain injury: Secondary | ICD-10-CM | POA: Insufficient documentation

## 2021-11-09 DIAGNOSIS — F419 Anxiety disorder, unspecified: Secondary | ICD-10-CM | POA: Diagnosis not present

## 2021-11-09 DIAGNOSIS — Q796 Ehlers-Danlos syndrome, unspecified: Secondary | ICD-10-CM | POA: Diagnosis not present

## 2021-11-09 DIAGNOSIS — F333 Major depressive disorder, recurrent, severe with psychotic symptoms: Secondary | ICD-10-CM | POA: Diagnosis not present

## 2021-11-09 DIAGNOSIS — R627 Adult failure to thrive: Secondary | ICD-10-CM | POA: Diagnosis not present

## 2021-11-09 DIAGNOSIS — E440 Moderate protein-calorie malnutrition: Secondary | ICD-10-CM | POA: Diagnosis not present

## 2021-11-09 NOTE — Telephone Encounter (Signed)
Copied from [place] [id number]. Topic: Quick Communication - Home Health Verbal Orders ?>> Nov 09, 2021  9:34 AM Tessa Lerner A wrote: ?Caller/Agency: Sherlynn Stalls / [facility]  ? ?Callback Number: [phone] ? ?Requesting OT/PT/Skilled Nursing/Social Work/Speech Therapy: OT ? ?Frequency: 1w3 ?

## 2021-11-09 NOTE — Telephone Encounter (Signed)
Advised 

## 2021-11-09 NOTE — Telephone Encounter (Signed)
RN spoke with mother of patient who reports patient is doing very well since treatments.  She continues to follow up with PCP and has [facility] home [facility].  Will disenroll from palliative with consent of mother at this time.  They will let us know if they have further needs.  Updated palliative team ?

## 2021-11-09 NOTE — Telephone Encounter (Signed)
That's fine

## 2021-11-10 ENCOUNTER — Ambulatory Visit (INDEPENDENT_AMBULATORY_CARE_PROVIDER_SITE_OTHER): Payer: Medicare Other | Admitting: Physician Assistant

## 2021-11-10 ENCOUNTER — Encounter: Payer: Self-pay | Admitting: Physician Assistant

## 2021-11-10 ENCOUNTER — Telehealth: Payer: Self-pay

## 2021-11-10 VITALS — BP 103/65 | HR 71 | Ht 59.5 in | Wt 120.0 lb

## 2021-11-10 DIAGNOSIS — J0110 Acute frontal sinusitis, unspecified: Secondary | ICD-10-CM

## 2021-11-10 DIAGNOSIS — H109 Unspecified conjunctivitis: Secondary | ICD-10-CM | POA: Diagnosis not present

## 2021-11-10 DIAGNOSIS — Q796 Ehlers-Danlos syndrome, unspecified: Secondary | ICD-10-CM | POA: Diagnosis not present

## 2021-11-10 DIAGNOSIS — B3731 Acute candidiasis of vulva and vagina: Secondary | ICD-10-CM

## 2021-11-10 DIAGNOSIS — R3989 Other symptoms and signs involving the genitourinary system: Secondary | ICD-10-CM | POA: Diagnosis not present

## 2021-11-10 DIAGNOSIS — F419 Anxiety disorder, unspecified: Secondary | ICD-10-CM | POA: Diagnosis not present

## 2021-11-10 DIAGNOSIS — E440 Moderate protein-calorie malnutrition: Secondary | ICD-10-CM | POA: Diagnosis not present

## 2021-11-10 DIAGNOSIS — R627 Adult failure to thrive: Secondary | ICD-10-CM | POA: Diagnosis not present

## 2021-11-10 DIAGNOSIS — M797 Fibromyalgia: Secondary | ICD-10-CM | POA: Diagnosis not present

## 2021-11-10 DIAGNOSIS — F333 Major depressive disorder, recurrent, severe with psychotic symptoms: Secondary | ICD-10-CM | POA: Diagnosis not present

## 2021-11-10 LAB — POCT URINALYSIS DIPSTICK
Bilirubin, UA: NEGATIVE
Blood, UA: NEGATIVE
Glucose, UA: NEGATIVE
Ketones, UA: NEGATIVE
Leukocytes, UA: NEGATIVE
Nitrite, UA: NEGATIVE
Protein, UA: NEGATIVE
Spec Grav, UA: 1.01 (ref 1.010–1.025)
Urobilinogen, UA: 0.2 E.U./dL
pH, UA: 6 (ref 5.0–8.0)

## 2021-11-10 MED ORDER — POLYMYXIN B-TRIMETHOPRIM 10000-0.1 UNIT/ML-% OP SOLN: 1.0000 [drp] | Freq: Four times a day (QID) | OPHTHALMIC | 0 refills | Status: DC

## 2021-11-10 NOTE — Progress Notes (Signed)
?I,Karen [name],acting as a scribe for [employer], PA-C.,have documented all relevant documentation on the behalf of Karen Kirschner, PA-C,as directed by  Karen Kirschner, PA-C while in the presence of Karen Kirschner, PA-C. ? ?Acute Office Visit ? ?Subjective:  ? ? Patient ID: Karen [name], female    DOB: 10-May-1976, 46 y.o.   MRN: [id number] ? ?Patient is in today for possible uti and sinus issues. ? ?HPI ?Urinary Tract Infection ?Patient complains of burning with urination, frequency, and burning after urinating . She has had symptoms for  1  week. Patient also complains of congestion, cough, and sorethroat. Patient denies back pain, fever, stomach ache, and vaginal discharge. Patient does have a history of recurrent UTI. She follows with a urologist. ?-Runny nose congestion, mucus yellow, crusty eyes and gunk in eyes started about a week ago. Reports using eye itch drops, eyelid wipes, azelastine hcl 0.05% drops, olopatadine HCl. ?-Claratin and allegra for allergies. ? ?Reports itching in her groin, ' jock itch'. Unsure if a rash, denies discharge.  ? ?Past Medical History:  ?Diagnosis Date  ? Ehlers-Danlos syndrome   ? Fibromyalgia   ? Headache   ? Sleep apnea   ? Tics of organic origin   ? Transient alteration of awareness   ? ? ?Past Surgical History:  ?Procedure Laterality Date  ? COLONOSCOPY WITH PROPOFOL N/A 02/13/2018  ? Procedure: COLONOSCOPY WITH PROPOFOL;  Surgeon: [name], MD;  Location: [facility] ENDOSCOPY;  Service: Endoscopy;  Laterality: N/A;  ? [place] OF UTERUS  2009  ? ESOPHAGOGASTRODUODENOSCOPY (EGD) WITH PROPOFOL  02/13/2018  ? Procedure: ESOPHAGOGASTRODUODENOSCOPY (EGD) WITH PROPOFOL;  Surgeon: [name], MD;  Location: [facility] ENDOSCOPY;  Service: Endoscopy;;  ? EYE SURGERY Left 1990  ? 3 Surgeries on left eye to correct crossed eye  ? LAPAROSCOPY Left 2003  ? Fallopian Tube  ? [address]  ? OTHER SURGICAL HISTORY Right 1987  ? 3 Surgeries to repair broken arm   ? OTHER SURGICAL HISTORY Left   ? 3 surgeries on her left thigh as an infant  ? OTHER SURGICAL HISTORY  1998  ? Jaw surgery  ? Right arm surgery  1987  ? x 3 due to fracture  ? TOENAIL EXCISION Bilateral 06/13/2019  ? Procedure: BILATERAL SECOND TOE PARTIAL NAIL ABLATION;  Surgeon: Erle Crocker, MD;  Location: [facility];  Service: Orthopedics;  Laterality: Bilateral;  SURGERY REQUEST TIME 1 HOUR  ? TONSILLECTOMY  12/13/2002  ? ? ?Family History  ?Problem Relation Age of Onset  ? Depression Mother   ? Stroke Mother   ? Fibromyalgia Mother   ? Osteoarthritis Mother   ? Asthma Brother   ? Depression Brother   ? Migraines Brother   ? Asperger's syndrome Brother   ? Other Brother   ?     Ehlers-Danlos Syndrome  ? Cancer Maternal Aunt   ? Asperger's syndrome Maternal Aunt   ? Breast cancer Maternal Aunt   ? Heart disease Paternal Aunt   ? Heart disease Paternal Uncle   ? Cervical cancer Maternal Grandmother   ? Osteoporosis Maternal Grandmother   ?     Died at 67  ? Osteoarthritis Maternal Grandmother   ? Colon cancer Maternal Grandfather   ? Asthma Maternal Grandfather   ? Asperger's syndrome Maternal Grandfather   ? Emphysema Maternal Grandfather   ?     Died at 33  ? Prostate cancer Maternal Grandfather   ? Heart attack Paternal  Grandfather   ?     Died at 3  ? Asthma Paternal Grandfather   ? Tuberculosis Paternal Grandfather   ? Cancer Cousin   ? Asperger's syndrome Cousin   ? Asperger's syndrome Other   ? Heart Problems Paternal Grandmother   ?     Died at 61  ? Ehlers-Danlos syndrome Father   ? Ehlers-Danlos syndrome Brother   ? ? ?Social History  ? ?Socioeconomic History  ? Marital status: Single  ?  Spouse name: Not on file  ? Number of children: Not on file  ? Years of education: Not on file  ? Highest education level: Not on file  ?Occupational History  ? Not on file  ?Tobacco Use  ? Smoking status: Never  ? Smokeless tobacco: Never  ?Vaping Use  ? Vaping Use: Never used  ?Substance and  Sexual Activity  ? Alcohol use: No  ?  Alcohol/week: 0.0 standard drinks  ? Drug use: No  ? Sexual activity: Never  ?Other Topics Concern  ? Not on file  ?Social History Narrative  ? [name] is 66.  ? 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   ? She lives with her mother.   ? She enjoys reading, watching TV, writing, and painting.   ? ?Social Determinants of Health  ? ?Financial Resource Strain: Not on file  ?Food Insecurity: Not on file  ?Transportation Needs: Not on file  ?Physical Activity: Not on file  ?Stress: Not on file  ?Social Connections: Not on file  ?Intimate Partner Violence: Not on file  ? ? ?Outpatient Medications Prior to Visit  ?Medication Sig Dispense Refill  ? albuterol (VENTOLIN HFA) 108 (90 Base) MCG/ACT inhaler Inhale 2 puffs into the lungs every 6 (six) hours as needed. 8 g 12  ? celecoxib (CELEBREX) 200 MG capsule TAKE 1 CAPSULE BY MOUTH TWICE A DAY 60 capsule 1  ? escitalopram (LEXAPRO) 10 MG tablet Take 1 tablet (10 mg total) by mouth daily. 30 tablet 0  ? fentaNYL (DURAGESIC) 50 MCG/HR Place 1 patch onto the skin every 3 (three) days. 10 patch 0  ? fexofenadine (ALLEGRA) 180 MG tablet Take 180 mg by mouth daily.    ? fluticasone (FLONASE) 50 MCG/ACT nasal spray     ? Fluticasone-Salmeterol (ADVAIR) 250-50 MCG/DOSE AEPB Inhale 1 puff into the lungs every 12 (twelve) hours. Rinse mouth after use 60 each 11  ? gabapentin (NEURONTIN) 600 MG tablet Take 1 tablet (600 mg total) by mouth 2 (two) times daily.    ? haloperidol (HALDOL) 0.5 MG tablet Take 0.5 tablets (0.25 mg total) by mouth 2 (two) times daily. 60 tablet 3  ? naloxone (NARCAN) nasal spray 4 mg/0.1 mL Place 1 spray into the nose as needed.    ? omeprazole (PRILOSEC) 40 MG capsule Take 1 capsule (40 mg total) by mouth daily.    ? ondansetron (ZOFRAN-ODT) 4 MG disintegrating tablet DISSOLVE 1 TABLET IN MOUTH EVERY 8 HOURS FOR NAUSEA AND VOMITING 30 tablet 5  ? QUEtiapine (SEROQUEL) 25 MG tablet Take 25 mg by mouth at bedtime.    ? Sod  Picosulfate-Mag Ox-Cit Acd (CLENPIQ) 10-3.5-12 MG-GM -GM/160ML SOLN Take 320 mLs by mouth as directed. 320 mL 0  ? tiZANidine (ZANAFLEX) 4 MG tablet TAKE 0.5-1 TABLETS (2-4 MG TOTAL) BY MOUTH EVERY 8 (EIGHT) HOURS AS NEEDED FOR MUSCLE SPASMS. 270 tablet 1  ? ?No facility-administered medications prior to visit.  ? ? ?Allergies  ?Allergen Reactions  ? Augmentin  [Amoxicillin-Pot  Clavulanate] Diarrhea  ? Chocolate Flavor   ?  Other reaction(s): Other (See Comments) ?Wheezing, acne  ? Demerol [Meperidine] Other (See Comments)  ?  Slow to wake up when this drug is given.   ? Keflex [Cephalexin] Nausea And Vomiting  ? Morphine And Related Nausea And Vomiting  ? Tape Dermatitis  ?  Must use paper tape only  ? Toradol [Ketorolac Tromethamine] Nausea And Vomiting  ? Amoxicillin   ?  Other reaction(s): Unknown  ? Chocolate   ?  GI distress  ? Nsaids   ?  patient develops ulcers ?Other reaction(s): Other (See Comments) ?patient develops ulcers  ? Strawberry Extract   ?  GI distress  ? ? ?Review of Systems  ?Constitutional:  Negative for fatigue and fever.  ?HENT:  Positive for congestion, postnasal drip, rhinorrhea, sinus pressure, sinus pain and sore throat.   ?Eyes:  Positive for discharge and itching.  ?Respiratory:  Negative for cough, shortness of breath and wheezing.   ?Cardiovascular:  Negative for chest pain and leg swelling.  ?Gastrointestinal:  Negative for abdominal pain.  ?Neurological:  Negative for dizziness and headaches.  ? ?   ?Objective:  ?  ?Physical Exam ?Constitutional:   ?   General: She is awake.  ?   Appearance: She is well-developed.  ?HENT:  ?   Head: Normocephalic.  ?   Right Ear: Tympanic membrane normal.  ?   Left Ear: Tympanic membrane normal.  ?   Nose: Congestion present. No rhinorrhea.  ?   Mouth/Throat:  ?   Pharynx: No oropharyngeal exudate or posterior oropharyngeal erythema.  ?Eyes:  ?   Conjunctiva/sclera:  ?   Right eye: Right conjunctiva is injected. No exudate. ?   Left eye: Left  conjunctiva is injected. No exudate. ?Cardiovascular:  ?   Rate and Rhythm: Normal rate and regular rhythm.  ?   Heart sounds: Normal heart sounds.  ?Pulmonary:  ?   Effort: Pulmonary effort is normal.  ?   Bre

## 2021-11-10 NOTE — Telephone Encounter (Signed)
Hi Dr. [name], this pt's mother called and stated that you had recently treated pt with ECT and wants to know if there's any kind of relapse that happens after the treatment. Also, mom wants to know what she should look for. Please advise. ?Dr. [name] response: ?They were referred for outpatient treatment after leaving the hospital. I believe I put in a referral to a RPA. The answer is that relapse is possible and unpredictable. She should look for changes similar to what characterized the problem before. ?  ?WRITER RELAYED MESSAGE FROM DR. CLAPACS TO PT'S MOTHER ?

## 2021-11-11 DIAGNOSIS — E440 Moderate protein-calorie malnutrition: Secondary | ICD-10-CM | POA: Diagnosis not present

## 2021-11-11 DIAGNOSIS — Q796 Ehlers-Danlos syndrome, unspecified: Secondary | ICD-10-CM | POA: Diagnosis not present

## 2021-11-11 DIAGNOSIS — M797 Fibromyalgia: Secondary | ICD-10-CM | POA: Diagnosis not present

## 2021-11-11 DIAGNOSIS — R627 Adult failure to thrive: Secondary | ICD-10-CM | POA: Diagnosis not present

## 2021-11-11 DIAGNOSIS — F419 Anxiety disorder, unspecified: Secondary | ICD-10-CM | POA: Diagnosis not present

## 2021-11-11 DIAGNOSIS — F333 Major depressive disorder, recurrent, severe with psychotic symptoms: Secondary | ICD-10-CM | POA: Diagnosis not present

## 2021-11-11 LAB — UA/M W/RFLX CULTURE, ROUTINE
Bilirubin, UA: NEGATIVE
Glucose, UA: NEGATIVE
Ketones, UA: NEGATIVE
Leukocytes,UA: NEGATIVE
Nitrite, UA: NEGATIVE
Protein,UA: NEGATIVE
RBC, UA: NEGATIVE
Specific Gravity, UA: <=1.005 — AB (ref 1.005–1.030)
Urobilinogen, Ur: 0.2 mg/dL (ref 0.2–1.0)
pH, UA: 6 (ref 5.0–7.5)

## 2021-11-11 LAB — MICROSCOPIC EXAMINATION
Bacteria, UA: NONE SEEN
Casts: NONE SEEN /lpf
RBC, Urine: NONE SEEN /hpf (ref 0–2)

## 2021-11-12 DIAGNOSIS — F418 Other specified anxiety disorders: Secondary | ICD-10-CM

## 2021-11-12 LAB — URINE CULTURE

## 2021-11-15 ENCOUNTER — Inpatient Hospital Stay: Payer: Medicare Other | Admitting: Family Medicine

## 2021-11-15 ENCOUNTER — Telehealth: Payer: Self-pay

## 2021-11-15 NOTE — Telephone Encounter (Signed)
Forms were faxed on 11/09/2021. I called [employer] and spoke with an agent who confirmed that they received fax on that day.   ?

## 2021-11-15 NOTE — Telephone Encounter (Signed)
Copied from [place] [id number]. Topic: General - Other ?>> Nov 15, 2021 11:20 AM [place]ill, Nelva Bush wrote: ?Reason for CRM: Alfredia Ferguson from [facility] wants to follow up on the standard written order faxed to the office on 03/21 for batteries for a power chair.Stated he could not get these to patient until he received the order.? ? ?Lennette Bihari is requesting a call back.  ? ? ?Phone: [phone] ?Fax- [phone] ?

## 2021-11-16 ENCOUNTER — Ambulatory Visit (INDEPENDENT_AMBULATORY_CARE_PROVIDER_SITE_OTHER): Payer: Medicare Other | Admitting: *Deleted

## 2021-11-16 DIAGNOSIS — R627 Adult failure to thrive: Secondary | ICD-10-CM | POA: Diagnosis not present

## 2021-11-16 DIAGNOSIS — F331 Major depressive disorder, recurrent, moderate: Secondary | ICD-10-CM

## 2021-11-16 DIAGNOSIS — Q796 Ehlers-Danlos syndrome, unspecified: Secondary | ICD-10-CM | POA: Diagnosis not present

## 2021-11-16 DIAGNOSIS — M797 Fibromyalgia: Secondary | ICD-10-CM | POA: Diagnosis not present

## 2021-11-16 DIAGNOSIS — F333 Major depressive disorder, recurrent, severe with psychotic symptoms: Secondary | ICD-10-CM | POA: Diagnosis not present

## 2021-11-16 DIAGNOSIS — F419 Anxiety disorder, unspecified: Secondary | ICD-10-CM | POA: Diagnosis not present

## 2021-11-16 DIAGNOSIS — E440 Moderate protein-calorie malnutrition: Secondary | ICD-10-CM | POA: Diagnosis not present

## 2021-11-16 NOTE — Chronic Care Management (AMB) (Signed)
?Chronic Care Management  ? ? Clinical Social Work Note ? ?11/16/2021 ?Name: Karen [name] MRN: [id number] DOB: [birth year] ? ?Karen [name] is a 46 y.o. year old female who is a primary care patient of Fisher, Kirstie Peri, MD. The CCM team was consulted to assist the patient with chronic disease management and/or care coordination needs related to: Mental Health Counseling and Resources.  ? ?Collaboration with patient's mother  for follow up visit in response to provider referral for social work chronic care management and care coordination services.  ? ?Consent to Services:  ?The patient was given information about Chronic Care Management services, agreed to services, and gave verbal consent prior to initiation of services.  Please see initial visit note for detailed documentation.  ? ?Patient agreed to services and consent obtained.  ? ?Assessment: Review of patient past medical history, allergies, medications, and health status, including review of relevant consultants reports was performed today as part of a comprehensive evaluation and provision of chronic care management and care coordination services.    ? ?SDOH (Social Determinants of Health) assessments and interventions performed:   ? ?Advanced Directives Status: Not addressed in this encounter. ? ?CCM Care Plan ? ?Allergies  ?Allergen Reactions  ? Augmentin  [Amoxicillin-Pot Clavulanate] Diarrhea  ? Chocolate Flavor   ?  Other reaction(s): Other (See Comments) ?Wheezing, acne  ? Demerol [Meperidine] Other (See Comments)  ?  Slow to wake up when this drug is given.   ? Keflex [Cephalexin] Nausea And Vomiting  ? Morphine And Related Nausea And Vomiting  ? Tape Dermatitis  ?  Must use paper tape only  ? Toradol [Ketorolac Tromethamine] Nausea And Vomiting  ? Amoxicillin   ?  Other reaction(s): Unknown  ? Chocolate   ?  GI distress  ? Nsaids   ?  patient develops ulcers ?Other reaction(s): Other (See Comments) ?patient develops ulcers  ? Strawberry Extract    ?  GI distress  ? ? ?Outpatient Encounter Medications as of 11/16/2021  ?Medication Sig  ? albuterol (VENTOLIN HFA) 108 (90 Base) MCG/ACT inhaler Inhale 2 puffs into the lungs every 6 (six) hours as needed.  ? celecoxib (CELEBREX) 200 MG capsule TAKE 1 CAPSULE BY MOUTH TWICE A DAY  ? escitalopram (LEXAPRO) 10 MG tablet Take 1 tablet (10 mg total) by mouth daily.  ? fentaNYL (DURAGESIC) 50 MCG/HR Place 1 patch onto the skin every 3 (three) days.  ? fexofenadine (ALLEGRA) 180 MG tablet Take 180 mg by mouth daily.  ? fluticasone (FLONASE) 50 MCG/ACT nasal spray   ? Fluticasone-Salmeterol (ADVAIR) 250-50 MCG/DOSE AEPB Inhale 1 puff into the lungs every 12 (twelve) hours. Rinse mouth after use  ? gabapentin (NEURONTIN) 600 MG tablet Take 1 tablet (600 mg total) by mouth 2 (two) times daily.  ? haloperidol (HALDOL) 0.5 MG tablet Take 0.5 tablets (0.25 mg total) by mouth 2 (two) times daily.  ? naloxone (NARCAN) nasal spray 4 mg/0.1 mL Place 1 spray into the nose as needed.  ? omeprazole (PRILOSEC) 40 MG capsule Take 1 capsule (40 mg total) by mouth daily.  ? ondansetron (ZOFRAN-ODT) 4 MG disintegrating tablet DISSOLVE 1 TABLET IN MOUTH EVERY 8 HOURS FOR NAUSEA AND VOMITING  ? QUEtiapine (SEROQUEL) 25 MG tablet Take 25 mg by mouth at bedtime.  ? Sod Picosulfate-Mag Ox-Cit Acd (CLENPIQ) 10-3.5-12 MG-GM -GM/160ML SOLN Take 320 mLs by mouth as directed.  ? tiZANidine (ZANAFLEX) 4 MG tablet TAKE 0.5-1 TABLETS (2-4 MG TOTAL) BY MOUTH EVERY 8 (EIGHT)  HOURS AS NEEDED FOR MUSCLE SPASMS.  ? trimethoprim-polymyxin b (POLYTRIM) ophthalmic solution Place 1 drop into both eyes every 6 (six) hours.  ? ?No facility-administered encounter medications on file as of 11/16/2021.  ? ? ?Patient Active Problem List  ? Diagnosis Date Noted  ? History of closed head injury 11/09/2021  ? Urinary tract infection without hematuria   ? Thyroid nodule 10/05/2021  ? Catatonia 10/05/2021  ? Depression, major, recurrent, severe with psychosis ([place])  10/05/2021  ? Polypharmacy 08/12/2021  ? Major depressive disorder, recurrent episode, moderate ([place]) 05/01/2021  ? Asthma 04/22/2021  ? Paranoia (psychosis) ([place]) 04/22/2021  ? Psychosis ([place]) 04/22/2021  ? Urinary retention 04/22/2021  ? Chronic tension type headache 06/04/2020  ? Fibrocystic breast changes, bilateral 10/09/2019  ? Family history of colon cancer   ? Rectal polyp   ? Heartburn   ? Excessive somnolence disorder 06/28/2017  ? Difficulty hearing 08/31/2015  ? Back pain, chronic 08/31/2015  ? Rheumatoid arthritis ([place]) 08/31/2015  ? Alopecia 03/10/2015  ? Arthritis 03/10/2015  ? Allergic asthma, mild persistent, uncomplicated 47/42/5956  ? Chronic nausea 03/10/2015  ? DDD (degenerative disc disease), lumbar 03/10/2015  ? Depression with anxiety 03/10/2015  ? Personal history of MRSA (methicillin resistant Staphylococcus aureus) 03/10/2015  ? Insomnia 03/10/2015  ? Mitral valve prolapse 03/10/2015  ? Paresthesias/numbness 03/10/2015  ? Allergic rhinitis, seasonal 03/10/2015  ? Seizure disorder ([place]) 03/10/2015  ? Mixed sleep apnea, Central predominant 03/10/2015  ? Migraine without aura 01/27/2014  ? Fibromyalgia 01/27/2014  ? Tics of organic origin 01/07/2014  ? Ehlers-Danlos syndrome 01/07/2014  ? ? ?Conditions to be addressed/monitored:  ?Acute metabolic encephalopathy  ? - Seizure disorder ([place])  ?- Psychosis, unspecified psychosis type ([place])  ? Limited social support ? ?Care Plan : General Social Work (Adult)  ?Updates made by Vern Claude, LCSW since 11/16/2021 12:00 AM  ?  ? ?Problem: [facility] AMB "PATIENT-SPECIFIC PROBLEM"   ?Note:   ?CARE PLAN ENTRY ?(see longitudinal plan of care for additional care plan information) ? ?Current Barriers:  ?Patient with Acute metabolic encephalopathy, Seizure disorder (HCC) and Psychosis, unspecified psychosis type ([place]) in need of assistance with connection to community resources -psychosis continues per patient's mother, patient now not speaking  verbally ?Knowledge deficits and need for support, education and care coordination related to community resource support  ?Limited social support, Mental Health Concerns , and Lacks knowledge of community resource: related to patient's supervision needs- Patient's mother is unable to leave patient alone without supervision ? ?Clinical Goal(s)  ?Over the next 90 days, patient will work with care management team member to address concerns related to patient's supervision needs in the home  ? ?Interventions provided by LCSW:  ?Continued to assessed patient's care coordination needs related to supervision needs in the absence of her parent and discussed ongoing care management follow up  ?Patient's mother confirmed that patient's ECT treatments have concluded, follow up with [facility] scheduled for 11/25/21 ?Patient also receiving PT/OT ?Patient's mother confirms that patient's behavior and mood now back to baseline, patient now oriented, appetite and hygiene has improved ?Active listening / Reflection utilized  ?Emotional Support and positive reinforcement provided for close psychiatric and medical follow up ?Caregiver Stress acknowledged , continued emphasis on self care ?Patient's mother confirmed having no additional community resource needs at this time ? ?Patient Self Care Activities & Deficits:  ?Patient is unable to independently navigate community resource options without care coordination support  ?Acknowledges deficits and is motivated to  resolve concern  ?Lacks social connections ?Performs ADL's independently ? ?Please see past updates related to this goal by clicking on the "Past Updates" button in the selected goal  ? ? ?  ?  ? ?Follow Up Plan:  Patient's mother to contact this social worker with any additional community resource needs. ?     ? ?Jalayla Chrismer, LCSW ?Clinical Social Worker  ?[facility] ?[phone] ? ? ? ?

## 2021-11-16 NOTE — Patient Instructions (Signed)
Visit Information ? ?Thank you for taking time to visit with me today. Please don't hesitate to contact me if I can be of assistance to you before our next scheduled telephone appointment. ? ?Following are the goals we discussed today:  ? ?- begin a notebook of services in my neighborhood or community ?- follow-up on any referrals for help I am given ?- think ahead to make sure my need does not become an emergency ?- have a back-up plan  ?-Continue follow up with [facility] on 11/25/21 ?  ? ?Please call the care guide team at [phone] if you need to cancel or reschedule your appointment.  ? ?If you are experiencing a Mental Health or [place] or need someone to talk to, please call the Suicide and Crisis Lifeline: 988  ? ?Patient verbalizes understanding of instructions and care plan provided today and agrees to view in [place]. Active MyChart status confirmed with patient.   ? ?No further follow up required: patient to continue to follow up with [facility] ? ? ?Karen Dunleavy, LCSW ?Clinical Social Worker  ?[facility] ?[phone] ? ?

## 2021-11-17 DIAGNOSIS — M797 Fibromyalgia: Secondary | ICD-10-CM | POA: Diagnosis not present

## 2021-11-17 DIAGNOSIS — Q796 Ehlers-Danlos syndrome, unspecified: Secondary | ICD-10-CM | POA: Diagnosis not present

## 2021-11-17 DIAGNOSIS — E440 Moderate protein-calorie malnutrition: Secondary | ICD-10-CM | POA: Diagnosis not present

## 2021-11-17 DIAGNOSIS — F419 Anxiety disorder, unspecified: Secondary | ICD-10-CM | POA: Diagnosis not present

## 2021-11-17 DIAGNOSIS — F333 Major depressive disorder, recurrent, severe with psychotic symptoms: Secondary | ICD-10-CM | POA: Diagnosis not present

## 2021-11-17 DIAGNOSIS — R627 Adult failure to thrive: Secondary | ICD-10-CM | POA: Diagnosis not present

## 2021-11-18 DIAGNOSIS — Q796 Ehlers-Danlos syndrome, unspecified: Secondary | ICD-10-CM | POA: Diagnosis not present

## 2021-11-18 DIAGNOSIS — E440 Moderate protein-calorie malnutrition: Secondary | ICD-10-CM | POA: Diagnosis not present

## 2021-11-18 DIAGNOSIS — F333 Major depressive disorder, recurrent, severe with psychotic symptoms: Secondary | ICD-10-CM | POA: Diagnosis not present

## 2021-11-18 DIAGNOSIS — R627 Adult failure to thrive: Secondary | ICD-10-CM | POA: Diagnosis not present

## 2021-11-18 DIAGNOSIS — F419 Anxiety disorder, unspecified: Secondary | ICD-10-CM | POA: Diagnosis not present

## 2021-11-18 DIAGNOSIS — M797 Fibromyalgia: Secondary | ICD-10-CM | POA: Diagnosis not present
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
## 2021-11-24 DIAGNOSIS — M797 Fibromyalgia: Secondary | ICD-10-CM | POA: Diagnosis not present

## 2021-11-24 DIAGNOSIS — R627 Adult failure to thrive: Secondary | ICD-10-CM | POA: Diagnosis not present

## 2021-11-24 DIAGNOSIS — F333 Major depressive disorder, recurrent, severe with psychotic symptoms: Secondary | ICD-10-CM | POA: Diagnosis not present

## 2021-11-24 DIAGNOSIS — F419 Anxiety disorder, unspecified: Secondary | ICD-10-CM | POA: Diagnosis not present

## 2021-11-24 DIAGNOSIS — E440 Moderate protein-calorie malnutrition: Secondary | ICD-10-CM | POA: Diagnosis not present

## 2021-11-24 DIAGNOSIS — Q796 Ehlers-Danlos syndrome, unspecified: Secondary | ICD-10-CM | POA: Diagnosis not present

## 2021-11-25 ENCOUNTER — Encounter: Payer: Self-pay | Admitting: Psychiatry

## 2021-11-25 ENCOUNTER — Ambulatory Visit (INDEPENDENT_AMBULATORY_CARE_PROVIDER_SITE_OTHER): Payer: Medicare Other | Admitting: Psychiatry

## 2021-11-25 VITALS — BP 113/66 | HR 65 | Temp 98.3°F

## 2021-11-25 DIAGNOSIS — F332 Major depressive disorder, recurrent severe without psychotic features: Secondary | ICD-10-CM | POA: Diagnosis not present

## 2021-11-25 DIAGNOSIS — G2569 Other tics of organic origin: Secondary | ICD-10-CM | POA: Diagnosis not present

## 2021-11-25 DIAGNOSIS — F333 Major depressive disorder, recurrent, severe with psychotic symptoms: Secondary | ICD-10-CM | POA: Diagnosis not present

## 2021-11-25 DIAGNOSIS — M797 Fibromyalgia: Secondary | ICD-10-CM | POA: Diagnosis not present

## 2021-11-25 DIAGNOSIS — R627 Adult failure to thrive: Secondary | ICD-10-CM | POA: Diagnosis not present

## 2021-11-25 DIAGNOSIS — Q796 Ehlers-Danlos syndrome, unspecified: Secondary | ICD-10-CM | POA: Diagnosis not present

## 2021-11-25 DIAGNOSIS — E440 Moderate protein-calorie malnutrition: Secondary | ICD-10-CM | POA: Diagnosis not present

## 2021-11-25 DIAGNOSIS — F419 Anxiety disorder, unspecified: Secondary | ICD-10-CM | POA: Diagnosis not present

## 2021-11-25 MED ORDER — DULOXETINE HCL 30 MG PO CPEP: 30.0000 mg | ORAL_CAPSULE | Freq: Every day | ORAL | 0 refills | Status: DC

## 2021-11-25 MED ORDER — DULOXETINE HCL 60 MG PO CPEP: 60.0000 mg | ORAL_CAPSULE | Freq: Every day | ORAL | 1 refills | Status: DC

## 2021-11-25 MED ORDER — QUETIAPINE FUMARATE 25 MG PO TABS: 25.0000 mg | ORAL_TABLET | Freq: Every day | ORAL | 1 refills | Status: DC

## 2021-11-28 DIAGNOSIS — F419 Anxiety disorder, unspecified: Secondary | ICD-10-CM | POA: Diagnosis not present

## 2021-11-28 DIAGNOSIS — F061 Catatonic disorder due to known physiological condition: Secondary | ICD-10-CM | POA: Diagnosis not present

## 2021-11-28 DIAGNOSIS — G4730 Sleep apnea, unspecified: Secondary | ICD-10-CM | POA: Diagnosis not present

## 2021-11-28 DIAGNOSIS — Z6821 Body mass index (BMI) 21.0-21.9, adult: Secondary | ICD-10-CM | POA: Diagnosis not present

## 2021-11-28 DIAGNOSIS — Q796 Ehlers-Danlos syndrome, unspecified: Secondary | ICD-10-CM | POA: Diagnosis not present

## 2021-11-28 DIAGNOSIS — G40909 Epilepsy, unspecified, not intractable, without status epilepticus: Secondary | ICD-10-CM | POA: Diagnosis not present

## 2021-11-28 DIAGNOSIS — R627 Adult failure to thrive: Secondary | ICD-10-CM | POA: Diagnosis not present

## 2021-11-28 DIAGNOSIS — F958 Other tic disorders: Secondary | ICD-10-CM | POA: Diagnosis not present

## 2021-11-28 DIAGNOSIS — F333 Major depressive disorder, recurrent, severe with psychotic symptoms: Secondary | ICD-10-CM | POA: Diagnosis not present

## 2021-11-28 DIAGNOSIS — M797 Fibromyalgia: Secondary | ICD-10-CM | POA: Diagnosis not present

## 2021-11-28 DIAGNOSIS — Z9181 History of falling: Secondary | ICD-10-CM | POA: Diagnosis not present

## 2021-11-28 DIAGNOSIS — Z87440 Personal history of urinary (tract) infections: Secondary | ICD-10-CM | POA: Diagnosis not present

## 2021-11-28 DIAGNOSIS — E041 Nontoxic single thyroid nodule: Secondary | ICD-10-CM | POA: Diagnosis not present

## 2021-11-28 DIAGNOSIS — E440 Moderate protein-calorie malnutrition: Secondary | ICD-10-CM | POA: Diagnosis not present

## 2021-11-28 DIAGNOSIS — Z993 Dependence on wheelchair: Secondary | ICD-10-CM | POA: Diagnosis not present

## 2021-11-29 DIAGNOSIS — E440 Moderate protein-calorie malnutrition: Secondary | ICD-10-CM | POA: Diagnosis not present

## 2021-11-29 DIAGNOSIS — M797 Fibromyalgia: Secondary | ICD-10-CM | POA: Diagnosis not present

## 2021-11-29 DIAGNOSIS — R627 Adult failure to thrive: Secondary | ICD-10-CM | POA: Diagnosis not present

## 2021-11-29 DIAGNOSIS — F333 Major depressive disorder, recurrent, severe with psychotic symptoms: Secondary | ICD-10-CM | POA: Diagnosis not present

## 2021-11-29 DIAGNOSIS — Q796 Ehlers-Danlos syndrome, unspecified: Secondary | ICD-10-CM | POA: Diagnosis not present

## 2021-11-29 DIAGNOSIS — F419 Anxiety disorder, unspecified: Secondary | ICD-10-CM | POA: Diagnosis not present

## 2021-12-01 ENCOUNTER — Ambulatory Visit (INDEPENDENT_AMBULATORY_CARE_PROVIDER_SITE_OTHER): Payer: Medicare Other

## 2021-12-01 VITALS — Wt 120.0 lb

## 2021-12-01 DIAGNOSIS — Z0000 Encounter for general adult medical examination without abnormal findings: Secondary | ICD-10-CM

## 2021-12-01 DIAGNOSIS — Z1211 Encounter for screening for malignant neoplasm of colon: Secondary | ICD-10-CM

## 2021-12-01 DIAGNOSIS — Z1231 Encounter for screening mammogram for malignant neoplasm of breast: Secondary | ICD-10-CM | POA: Diagnosis not present

## 2021-12-01 NOTE — Patient Instructions (Addendum)
Karen [name] , ?Thank you for taking time to come for your Medicare Wellness Visit. I appreciate your ongoing commitment to your health goals. Please review the following plan we discussed and let me know if I can assist you in the future.  ? ?Screening recommendations/referrals: ?Colonoscopy: referral sent ?Mammogram: 10/29/19,  referral sent ?Bone Density: too young ?Recommended yearly ophthalmology/optometry visit for glaucoma screening and checkup ?Recommended yearly dental visit for hygiene and checkup ? ?Vaccinations: ?Influenza vaccine: 07/19/21 ?Pneumococcal vaccine: 05/24/17 ?Tdap vaccine: 06/14/12 ?Shingles vaccine: n/d  ?Covid-19: 11/05/19, 11/26/19, 05/26/20, 07/19/21 ? ?Advanced directives: no ? ?Conditions/risks identified: none ? ?Next appointment: Follow up in one year for your annual wellness visit. -12/05/22 3:15 in person ? ?Preventive Care 40-64 Years, Female ?Preventive care refers to lifestyle choices and visits with your health care provider that can promote health and wellness. ?What does preventive care include? ?A yearly physical exam. This is also called an annual well check. ?Dental exams once or twice a year. ?Routine eye exams. Ask your health care provider how often you should have your eyes checked. ?Personal lifestyle choices, including: ?Daily care of your teeth and gums. ?Regular physical activity. ?Eating a healthy diet. ?Avoiding tobacco and drug use. ?Limiting alcohol use. ?Practicing safe sex. ?Taking low-dose aspirin daily starting at age 23. ?Taking vitamin and mineral supplements as recommended by your health care provider. ?What happens during an annual well check? ?The services and screenings done by your health care provider during your annual well check will depend on your age, overall health, lifestyle risk factors, and family history of disease. ?Counseling  ?Your health care provider may ask you questions about your: ?Alcohol use. ?Tobacco use. ?Drug use. ?Emotional  well-being. ?Home and relationship well-being. ?Sexual activity. ?Eating habits. ?Work and work Statistician. ?Method of birth control. ?Menstrual cycle. ?Pregnancy history. ?Screening  ?You may have the following tests or measurements: ?Height, weight, and BMI. ?Blood pressure. ?Lipid and cholesterol levels. These may be checked every 5 years, or more frequently if you are over 18 years old. ?Skin check. ?Lung cancer screening. You may have this screening every year starting at age 13 if you have a 30-pack-year history of smoking and currently smoke or have quit within the past 15 years. ?Fecal occult blood test (FOBT) of the stool. You may have this test every year starting at age 4. ?Flexible sigmoidoscopy or colonoscopy. You may have a sigmoidoscopy every 5 years or a colonoscopy every 10 years starting at age 2. ?Hepatitis C blood test. ?Hepatitis B blood test. ?Sexually transmitted disease (STD) testing. ?Diabetes screening. This is done by checking your blood sugar (glucose) after you have not eaten for a while (fasting). You may have this done every 1-3 years. ?Mammogram. This may be done every 1-2 years. Talk to your health care provider about when you should start having regular mammograms. This may depend on whether you have a family history of breast cancer. ?BRCA-related cancer screening. This may be done if you have a family history of breast, ovarian, tubal, or peritoneal cancers. ?Pelvic exam and Pap test. This may be done every 3 years starting at age 66. Starting at age 40, this may be done every 5 years if you have a Pap test in combination with an HPV test. ?Bone density scan. This is done to screen for osteoporosis. You may have this scan if you are at high risk for osteoporosis. ?Discuss your test results, treatment options, and if necessary, the need for more tests with your  health care provider. ?Vaccines  ?Your health care provider may recommend certain vaccines, such as: ?Influenza  vaccine. This is recommended every year. ?Tetanus, diphtheria, and acellular pertussis (Tdap, Td) vaccine. You may need a Td booster every 10 years. ?Zoster vaccine. You may need this after age 30. ?Pneumococcal 13-valent conjugate (PCV13) vaccine. You may need this if you have certain conditions and were not previously vaccinated. ?Pneumococcal polysaccharide (PPSV23) vaccine. You may need one or two doses if you smoke cigarettes or if you have certain conditions. ?Talk to your health care provider about which screenings and vaccines you need and how often you need them. ?This information is not intended to replace advice given to you by your health care provider. Make sure you discuss any questions you have with your health care provider. ?Document Released: 08/28/2015 Document Revised: 04/20/2016 Document Reviewed: 06/02/2015 ?Elsevier Interactive Patient Education ? 2017 Else[employer] ? ? ? ?Fall Prevention in the Home ?Falls can cause injuries. They can happen to people of all ages. There are many things you can do to make your home safe and to help prevent falls. ?What can I do on the outside of my home? ?Regularly fix the edges of walkways and driveways and fix any cracks. ?Remove anything that might make you trip as you walk through a door, such as a raised step or threshold. ?Trim any bushes or trees on the path to your home. ?Use bright outdoor lighting. ?Clear any walking paths of anything that might make someone trip, such as rocks or tools. ?Regularly check to see if handrails are loose or broken. Make sure that both sides of any steps have handrails. ?Any raised decks and porches should have guardrails on the edges. ?Have any leaves, snow, or ice cleared regularly. ?Use sand or salt on walking paths during winter. ?Clean up any spills in your garage right away. This includes oil or grease spills. ?What can I do in the bathroom? ?Use night lights. ?Install grab bars by the toilet and in the tub and  shower. Do not use towel bars as grab bars. ?Use non-skid mats or decals in the tub or shower. ?If you need to sit down in the shower, use a plastic, non-slip stool. ?Keep the floor dry. Clean up any water that spills on the floor as soon as it happens. ?Remove soap buildup in the tub or shower regularly. ?Attach bath mats securely with double-sided non-slip rug tape. ?Do not have throw rugs and other things on the floor that can make you trip. ?What can I do in the bedroom? ?Use night lights. ?Make sure that you have a light by your bed that is easy to reach. ?Do not use any sheets or blankets that are too big for your bed. They should not hang down onto the floor. ?Have a firm chair that has side arms. You can use this for support while you get dressed. ?Do not have throw rugs and other things on the floor that can make you trip. ?What can I do in the kitchen? ?Clean up any spills right away. ?Avoid walking on wet floors. ?Keep items that you use a lot in easy-to-reach places. ?If you need to reach something above you, use a strong step stool that has a grab bar. ?Keep electrical cords out of the way. ?Do not use floor polish or wax that makes floors slippery. If you must use wax, use non-skid floor wax. ?Do not have throw rugs and other things on the floor that  can make you trip. ?What can I do with my stairs? ?Do not leave any items on the stairs. ?Make sure that there are handrails on both sides of the stairs and use them. Fix handrails that are broken or loose. Make sure that handrails are as long as the stairways. ?Check any carpeting to make sure that it is firmly attached to the stairs. Fix any carpet that is loose or worn. ?Avoid having throw rugs at the top or bottom of the stairs. If you do have throw rugs, attach them to the floor with carpet tape. ?Make sure that you have a light switch at the top of the stairs and the bottom of the stairs. If you do not have them, ask someone to add them for  you. ?What else can I do to help prevent falls? ?Wear shoes that: ?Do not have high heels. ?Have rubber bottoms. ?Are comfortable and fit you well. ?Are closed at the toe. Do not wear sandals. ?If you use a stepladder: ?Make sur

## 2021-12-01 NOTE — Progress Notes (Signed)
? ?Subjective:  ? Karen [name] is a 46 y.o. female who presents for Medicare Annual (Subsequent) preventive examination. ? ?Review of Systems    ? ?  ? ?   ?Objective:  ?  ?Today's Vitals  ? 12/01/21 1434  ?Weight: 120 lb (54.4 kg)  ? ?Body mass index is 23.83 kg/m?. ? ? ?  10/05/2021  ? 12:56 PM 10/04/2021  ?  7:03 PM 06/22/2021  ? 11:42 AM 04/29/2021  ?  4:06 AM 04/22/2021  ?  4:58 PM 04/22/2021  ?  6:43 AM 01/29/2021  ?  9:00 PM  ?Advanced Directives  ?Does Patient Have a Medical Advance Directive? Unable to assess, patient is non-responsive or altered mental status Unable to assess, patient is non-responsive or altered mental status No No No Unable to assess, patient is non-responsive or altered mental status No  ?Would patient like information on creating a medical advance directive?    No - Patient declined No - Patient declined  No - Patient declined  ? ? ?Current Medications (verified) ?Outpatient Encounter Medications as of 12/01/2021  ?Medication Sig  ? albuterol (VENTOLIN HFA) 108 (90 Base) MCG/ACT inhaler Inhale 2 puffs into the lungs every 6 (six) hours as needed.  ? celecoxib (CELEBREX) 200 MG capsule TAKE 1 CAPSULE BY MOUTH TWICE A DAY  ? DULoxetine (CYMBALTA) 30 MG capsule Take 1 capsule (30 mg total) by mouth daily for 7 days.  ? [START ON 12/02/2021] DULoxetine (CYMBALTA) 60 MG capsule Take 1 capsule (60 mg total) by mouth daily. Start after completing 30 mg daily for one week  ? fentaNYL (DURAGESIC) 50 MCG/HR Place 1 patch onto the skin every 3 (three) days.  ? fexofenadine (ALLEGRA) 180 MG tablet Take 180 mg by mouth daily.  ? fluticasone (FLONASE) 50 MCG/ACT nasal spray   ? Fluticasone-Salmeterol (ADVAIR) 250-50 MCG/DOSE AEPB Inhale 1 puff into the lungs every 12 (twelve) hours. Rinse mouth after use  ? gabapentin (NEURONTIN) 600 MG tablet Take 1 tablet (600 mg total) by mouth 2 (two) times daily.  ? haloperidol (HALDOL) 0.5 MG tablet Take 0.5 tablets (0.25 mg total) by mouth 2 (two) times daily.  ?  naloxone (NARCAN) nasal spray 4 mg/0.1 mL Place 1 spray into the nose as needed.  ? omeprazole (PRILOSEC) 40 MG capsule Take 1 capsule (40 mg total) by mouth daily.  ? ondansetron (ZOFRAN-ODT) 4 MG disintegrating tablet DISSOLVE 1 TABLET IN MOUTH EVERY 8 HOURS FOR NAUSEA AND VOMITING  ? QUEtiapine (SEROQUEL) 25 MG tablet Take 25 mg by mouth at bedtime.  ? QUEtiapine (SEROQUEL) 25 MG tablet Take 1 tablet (25 mg total) by mouth at bedtime.  ? Sod Picosulfate-Mag Ox-Cit Acd (CLENPIQ) 10-3.5-12 MG-GM -GM/160ML SOLN Take 320 mLs by mouth as directed.  ? tiZANidine (ZANAFLEX) 4 MG tablet TAKE 0.5-1 TABLETS (2-4 MG TOTAL) BY MOUTH EVERY 8 (EIGHT) HOURS AS NEEDED FOR MUSCLE SPASMS.  ? trimethoprim-polymyxin b (POLYTRIM) ophthalmic solution Place 1 drop into both eyes every 6 (six) hours.  ? ?No facility-administered encounter medications on file as of 12/01/2021.  ? ? ?Allergies (verified) ?Augmentin  [amoxicillin-pot clavulanate], Chocolate flavor, Demerol [meperidine], Keflex [cephalexin], Morphine and related, Tape, Toradol [ketorolac tromethamine], Amoxicillin, Chocolate, Nsaids, and Strawberry extract  ? ?History: ?Past Medical History:  ?Diagnosis Date  ? Ehlers-Danlos syndrome   ? Fibromyalgia   ? Headache   ? Sleep apnea   ? Tics of organic origin   ? Transient alteration of awareness   ? ?Past Surgical History:  ?  Procedure Laterality Date  ? COLONOSCOPY WITH PROPOFOL N/A 02/13/2018  ? Procedure: COLONOSCOPY WITH PROPOFOL;  Surgeon: [name], MD;  Location: [facility] ENDOSCOPY;  Service: Endoscopy;  Laterality: N/A;  ? [place] OF UTERUS  2009  ? ESOPHAGOGASTRODUODENOSCOPY (EGD) WITH PROPOFOL  02/13/2018  ? Procedure: ESOPHAGOGASTRODUODENOSCOPY (EGD) WITH PROPOFOL;  Surgeon: [name], MD;  Location: [facility] ENDOSCOPY;  Service: Endoscopy;;  ? EYE SURGERY Left 1990  ? 3 Surgeries on left eye to correct crossed eye  ? LAPAROSCOPY Left 2003  ? Fallopian Tube  ? [place]  ? OTHER SURGICAL HISTORY  Right 1987  ? 3 Surgeries to repair broken arm  ? OTHER SURGICAL HISTORY Left   ? 3 surgeries on her left thigh as an infant  ? OTHER SURGICAL HISTORY  1998  ? Jaw surgery  ? Right arm surgery  1987  ? x 3 due to fracture  ? TOENAIL EXCISION Bilateral 06/13/2019  ? Procedure: BILATERAL SECOND TOE PARTIAL NAIL ABLATION;  Surgeon: Erle Crocker, MD;  Location: [facility];  Service: Orthopedics;  Laterality: Bilateral;  SURGERY REQUEST TIME 1 HOUR  ? TONSILLECTOMY  12/13/2002  ? ?Family History  ?Problem Relation Age of Onset  ? Depression Mother   ? Stroke Mother   ? Fibromyalgia Mother   ? Osteoarthritis Mother   ? Asthma Brother   ? Depression Brother   ? Migraines Brother   ? Asperger's syndrome Brother   ? Other Brother   ?     Ehlers-Danlos Syndrome  ? Cancer Maternal Aunt   ? Asperger's syndrome Maternal Aunt   ? Breast cancer Maternal Aunt   ? Heart disease Paternal Aunt   ? Heart disease Paternal Uncle   ? Cervical cancer Maternal Grandmother   ? Osteoporosis Maternal Grandmother   ?     Died at 47  ? Osteoarthritis Maternal Grandmother   ? Colon cancer Maternal Grandfather   ? Asthma Maternal Grandfather   ? Asperger's syndrome Maternal Grandfather   ? Emphysema Maternal Grandfather   ?     Died at 85  ? Prostate cancer Maternal Grandfather   ? Heart attack Paternal Grandfather   ?     Died at 6  ? Asthma Paternal Grandfather   ? Tuberculosis Paternal Grandfather   ? Cancer Cousin   ? Asperger's syndrome Cousin   ? Asperger's syndrome Other   ? Heart Problems Paternal Grandmother   ?     Died at 41  ? Ehlers-Danlos syndrome Father   ? Ehlers-Danlos syndrome Brother   ? ?Social History  ? ?Socioeconomic History  ? Marital status: Single  ?  Spouse name: Not on file  ? Number of children: 0  ? Years of education: Not on file  ? Highest education level: Bachelor's degree (e.g., BA, AB, BS)  ?Occupational History  ? Not on file  ?Tobacco Use  ? Smoking status: Never  ? Smokeless tobacco:  Never  ?Vaping Use  ? Vaping Use: Never used  ?Substance and Sexual Activity  ? Alcohol use: No  ?  Alcohol/week: 0.0 standard drinks  ? Drug use: No  ? Sexual activity: Never  ?Other Topics Concern  ? Not on file  ?Social History Narrative  ? Karen [name] is 59.  ? 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   ? She lives with her mother.   ? She enjoys reading, watching TV, writing, and painting.   ? ?Social Determinants of Health  ? ?  Financial Resource Strain: Not on file  ?Food Insecurity: Not on file  ?Transportation Needs: Not on file  ?Physical Activity: Not on file  ?Stress: Not on file  ?Social Connections: Not on file  ? ? ?Tobacco Counseling ?Counseling given: Not Answered ? ? ?Clinical Intake: ? ?Pre-visit preparation completed: Yes ? ?Pain : No/denies pain ? ?  ? ?Nutritional Status: BMI <19  Underweight ?Nutritional Risks: None ?Diabetes: No ? ?How often do you need to have someone help you when you read instructions, pamphlets, or other written materials from your doctor or pharmacy?: 1 - Never ? ?Diabetic?none ? ?Interpreter Needed?: No ? ?Information entered by :: Kirke Shaggy, LPN ? ? ?Activities of Daily Living ? ?  11/05/2021  ?  4:01 PM 10/05/2021  ?  1:03 PM  ?In your present state of health, do you have any difficulty performing the following activities:  ?Hearing? 0   ?Vision? 1   ?Difficulty concentrating or making decisions? 1   ?Walking or climbing stairs? 1   ?Dressing or bathing? 0   ?Doing errands, shopping? 1 1  ? ? ?Patient Care Team: ?Birdie Sons, MD as PCP - General (Family Medicine) ?Jodi Geralds, MD (Inactive) as Consulting Physician (Pediatric Neurology) ?[place], [place], [place] as Education officer, museum ? ?Indicate any recent Medical Services you may have received from other than [facility] providers in the past year (date may be approximate). ? ?   ?Assessment:  ? This is a routine wellness examination for Karen [name]. ? ?Hearing/Vision screen ?No results found. ? ?Dietary issues and exercise  activities discussed: ?  ? ? Goals Addressed   ?None ?  ? ?Depression Screen ? ?  11/25/2021  ? 10:00 AM 11/05/2021  ?  4:02 PM 09/15/2021  ?  7:26 PM 03/26/2021  ?  3:01 PM 12/07/2020  ?  3:30 PM 08/10/2018  ?  4:04

## 2021-12-02 ENCOUNTER — Other Ambulatory Visit: Payer: Self-pay

## 2021-12-02 DIAGNOSIS — R627 Adult failure to thrive: Secondary | ICD-10-CM | POA: Diagnosis not present

## 2021-12-02 DIAGNOSIS — M797 Fibromyalgia: Secondary | ICD-10-CM | POA: Diagnosis not present

## 2021-12-02 DIAGNOSIS — Z86010 Personal history of colonic polyps: Secondary | ICD-10-CM

## 2021-12-02 DIAGNOSIS — F333 Major depressive disorder, recurrent, severe with psychotic symptoms: Secondary | ICD-10-CM | POA: Diagnosis not present

## 2021-12-02 DIAGNOSIS — E440 Moderate protein-calorie malnutrition: Secondary | ICD-10-CM | POA: Diagnosis not present

## 2021-12-02 DIAGNOSIS — Q796 Ehlers-Danlos syndrome, unspecified: Secondary | ICD-10-CM | POA: Diagnosis not present

## 2021-12-02 DIAGNOSIS — F419 Anxiety disorder, unspecified: Secondary | ICD-10-CM | POA: Diagnosis not present

## 2021-12-02 MED ORDER — NA SULFATE-K SULFATE-MG SULF 17.5-3.13-1.6 GM/177ML PO SOLN: 1.0000 | Freq: Once | ORAL | 0 refills | Status: AC

## 2021-12-02 NOTE — Progress Notes (Signed)
Gastroenterology Pre-Procedure Review ? ?Request Date: Tues 01/11/22 ?Requesting Physician: Dr. Allen Norris ? ?PATIENT REVIEW QUESTIONS: The patient responded to the following health history questions as indicated:   ? ?1. Are you having any GI issues? no ?2. Do you have a personal history of Polyps? yes (2019 Dr. Allen Norris) ?3. Do you have a family history of Colon Cancer or Polyps?  Great uncles, both brothers colon cancer ?4. Diabetes Mellitus? no ?5. Joint replacements in the past 12 months?no ?6. Major health problems in the past 3 months?no ?7. Any artificial heart valves, MVP, or defibrillator?no ?   ?MEDICATIONS & ALLERGIES:    ?Patient reports the following regarding taking any anticoagulation/antiplatelet therapy:   ?Plavix, Coumadin, Eliquis, Xarelto, Lovenox, Pradaxa, Brilinta, or Effient? no ?Aspirin? no ? ?Patient confirms/reports the following medications:  ?Current Outpatient Medications  ?Medication Sig Dispense Refill  ? albuterol (VENTOLIN HFA) 108 (90 Base) MCG/ACT inhaler Inhale 2 puffs into the lungs every 6 (six) hours as needed. 8 g 12  ? celecoxib (CELEBREX) 200 MG capsule TAKE 1 CAPSULE BY MOUTH TWICE A DAY 60 capsule 1  ? DULoxetine (CYMBALTA) 30 MG capsule Take 1 capsule (30 mg total) by mouth daily for 7 days. 7 capsule 0  ? DULoxetine (CYMBALTA) 60 MG capsule Take 1 capsule (60 mg total) by mouth daily. Start after completing 30 mg daily for one week 30 capsule 1  ? fentaNYL (DURAGESIC) 50 MCG/HR Place 1 patch onto the skin every 3 (three) days. 10 patch 0  ? fexofenadine (ALLEGRA) 180 MG tablet Take 180 mg by mouth daily.    ? fluticasone (FLONASE) 50 MCG/ACT nasal spray     ? Fluticasone-Salmeterol (ADVAIR) 250-50 MCG/DOSE AEPB Inhale 1 puff into the lungs every 12 (twelve) hours. Rinse mouth after use 60 each 11  ? gabapentin (NEURONTIN) 600 MG tablet Take 1 tablet (600 mg total) by mouth 2 (two) times daily.    ? haloperidol (HALDOL) 0.5 MG tablet Take 0.5 tablets (0.25 mg total) by mouth 2  (two) times daily. 60 tablet 3  ? naloxone (NARCAN) nasal spray 4 mg/0.1 mL Place 1 spray into the nose as needed.    ? omeprazole (PRILOSEC) 40 MG capsule Take 1 capsule (40 mg total) by mouth daily.    ? ondansetron (ZOFRAN-ODT) 4 MG disintegrating tablet DISSOLVE 1 TABLET IN MOUTH EVERY 8 HOURS FOR NAUSEA AND VOMITING 30 tablet 5  ? QUEtiapine (SEROQUEL) 25 MG tablet Take 25 mg by mouth at bedtime.    ? QUEtiapine (SEROQUEL) 25 MG tablet Take 1 tablet (25 mg total) by mouth at bedtime. 30 tablet 1  ? Sod Picosulfate-Mag Ox-Cit Acd (CLENPIQ) 10-3.5-12 MG-GM -GM/160ML SOLN Take 320 mLs by mouth as directed. (Patient not taking: Reported on 12/01/2021) 320 mL 0  ? tiZANidine (ZANAFLEX) 4 MG tablet TAKE 0.5-1 TABLETS (2-4 MG TOTAL) BY MOUTH EVERY 8 (EIGHT) HOURS AS NEEDED FOR MUSCLE SPASMS. 270 tablet 1  ? trimethoprim-polymyxin b (POLYTRIM) ophthalmic solution Place 1 drop into both eyes every 6 (six) hours. 10 mL 0  ? ?No current facility-administered medications for this visit.  ? ? ?Patient confirms/reports the following allergies:  ?Allergies  ?Allergen Reactions  ? Augmentin  [Amoxicillin-Pot Clavulanate] Diarrhea  ? Chocolate Flavor   ?  Other reaction(s): Other (See Comments) ?Wheezing, acne  ? Demerol [Meperidine] Other (See Comments)  ?  Slow to wake up when this drug is given.   ? Keflex [Cephalexin] Nausea And Vomiting  ? Morphine And Related Nausea And  Vomiting  ? Tape Dermatitis  ?  Must use paper tape only  ? Toradol [Ketorolac Tromethamine] Nausea And Vomiting  ? Amoxicillin   ?  Other reaction(s): Unknown  ? Chocolate   ?  GI distress  ? Nsaids   ?  patient develops ulcers ?Other reaction(s): Other (See Comments) ?patient develops ulcers  ? Strawberry Extract   ?  GI distress  ? ? ?No orders of the defined types were placed in this encounter. ? ? ?AUTHORIZATION INFORMATION ?Primary Insurance: ?1D#: ?Group #: ? ?Secondary Insurance: ?1D#: ?Group #: ? ?SCHEDULE INFORMATION: ?Date:  01/11/22 ?Time: ?Location: [facility] ?

## 2021-12-03 ENCOUNTER — Encounter (INDEPENDENT_AMBULATORY_CARE_PROVIDER_SITE_OTHER): Payer: Medicare Other | Admitting: Family Medicine

## 2021-12-03 ENCOUNTER — Other Ambulatory Visit: Payer: Self-pay | Admitting: Family Medicine

## 2021-12-03 DIAGNOSIS — Z993 Dependence on wheelchair: Secondary | ICD-10-CM

## 2021-12-03 DIAGNOSIS — Z9181 History of falling: Secondary | ICD-10-CM | POA: Diagnosis not present

## 2021-12-03 DIAGNOSIS — R627 Adult failure to thrive: Secondary | ICD-10-CM

## 2021-12-03 DIAGNOSIS — E440 Moderate protein-calorie malnutrition: Secondary | ICD-10-CM | POA: Diagnosis not present

## 2021-12-03 DIAGNOSIS — G8929 Other chronic pain: Secondary | ICD-10-CM

## 2021-12-03 DIAGNOSIS — G40909 Epilepsy, unspecified, not intractable, without status epilepticus: Secondary | ICD-10-CM | POA: Diagnosis not present

## 2021-12-03 DIAGNOSIS — M797 Fibromyalgia: Secondary | ICD-10-CM

## 2021-12-03 DIAGNOSIS — Q796 Ehlers-Danlos syndrome, unspecified: Secondary | ICD-10-CM | POA: Diagnosis not present

## 2021-12-03 DIAGNOSIS — E041 Nontoxic single thyroid nodule: Secondary | ICD-10-CM | POA: Diagnosis not present

## 2021-12-03 DIAGNOSIS — F958 Other tic disorders: Secondary | ICD-10-CM

## 2021-12-03 DIAGNOSIS — G4730 Sleep apnea, unspecified: Secondary | ICD-10-CM

## 2021-12-03 DIAGNOSIS — F419 Anxiety disorder, unspecified: Secondary | ICD-10-CM

## 2021-12-03 DIAGNOSIS — F333 Major depressive disorder, recurrent, severe with psychotic symptoms: Secondary | ICD-10-CM

## 2021-12-03 NOTE — Telephone Encounter (Signed)
Requested medication (s) are due for refill today: yes ? ?Requested medication (s) are on the active medication list: yes ? ?Last refill:  11/05/21 #10/0 ? ?Future visit scheduled: yes ? ?Notes to clinic:  Unable to refill per protocol, cannot delegate. ? ?  ?Requested Prescriptions  ?Pending Prescriptions Disp Refills  ? fentaNYL (DURAGESIC) 50 MCG/HR 10 patch 0  ?  Sig: Place 1 patch onto the skin every 3 (three) days.  ?  ? Not Delegated - Analgesics:  Opioid Agonists Failed - 12/03/2021  2:42 PM  ?  ?  Failed - This refill cannot be delegated  ?  ?  Passed - Urine Drug Screen completed in last 360 days  ?  ?  Passed - Valid encounter within last 3 months  ?  Recent Outpatient Visits   ? ?      ? 3 weeks ago Conjunctivitis of both eyes, unspecified conjunctivitis type  ? [place], PA-C  ? 4 weeks ago Catatonia  ? [facility] Birdie Sons, MD  ? 2 months ago Delusional disorder [facility])  ? [facility] Birdie Sons, MD  ? 2 months ago Delusional disorder [facility])  ? [facility] Birdie Sons, MD  ? 4 months ago Delusional disorder [facility])  ? [facility] Caryn Section, Kirstie Peri, MD  ? ?  ?  ?Future Appointments   ? ?        ? In 4 days Fisher, Kirstie Peri, MD [facility]  ? In 2 months MacDiarmid, Nicki Reaper, MD [facility]  ? ?  ? ? ?  ?  ?  ? ?

## 2021-12-03 NOTE — Telephone Encounter (Signed)
Medication Refill - Medication: fentaNYL (DURAGESIC) 50 MCG/HR  ? ?Has the patient contacted their pharmacy? No. ?(Agent: If yes, when and what did the pharmacy advise?) No  ? ?Preferred Pharmacy (with phone number or street name):  ?[name]  [phone] ?Fax:  [phone] ?  ? ?Has the patient been seen for an appointment in the last year OR does the patient have an upcoming appointment? Yes.   ? ?Agent: Please be advised that RX refills may take up to 3 business days. We ask that you follow-up with your pharmacy. ? ?

## 2021-12-04 MED ORDER — FENTANYL 50 MCG/HR TD PT72: 1.0000 | MEDICATED_PATCH | TRANSDERMAL | 0 refills | Status: DC

## 2021-12-07 ENCOUNTER — Ambulatory Visit (INDEPENDENT_AMBULATORY_CARE_PROVIDER_SITE_OTHER): Payer: Medicare Other | Admitting: Family Medicine

## 2021-12-07 ENCOUNTER — Encounter: Payer: Self-pay | Admitting: Family Medicine

## 2021-12-07 VITALS — BP 95/69 | HR 73 | Temp 98.1°F | Resp 16

## 2021-12-07 DIAGNOSIS — Q796 Ehlers-Danlos syndrome, unspecified: Secondary | ICD-10-CM | POA: Diagnosis not present

## 2021-12-07 DIAGNOSIS — M797 Fibromyalgia: Secondary | ICD-10-CM

## 2021-12-07 DIAGNOSIS — G8929 Other chronic pain: Secondary | ICD-10-CM | POA: Diagnosis not present

## 2021-12-07 DIAGNOSIS — F418 Other specified anxiety disorders: Secondary | ICD-10-CM

## 2021-12-07 DIAGNOSIS — R339 Retention of urine, unspecified: Secondary | ICD-10-CM | POA: Diagnosis not present

## 2021-12-07 DIAGNOSIS — J4531 Mild persistent asthma with (acute) exacerbation: Secondary | ICD-10-CM | POA: Diagnosis not present

## 2021-12-07 DIAGNOSIS — Z1283 Encounter for screening for malignant neoplasm of skin: Secondary | ICD-10-CM

## 2021-12-07 DIAGNOSIS — M5136 Other intervertebral disc degeneration, lumbar region: Secondary | ICD-10-CM

## 2021-12-07 DIAGNOSIS — R32 Unspecified urinary incontinence: Secondary | ICD-10-CM | POA: Diagnosis not present

## 2021-12-07 DIAGNOSIS — M549 Dorsalgia, unspecified: Secondary | ICD-10-CM | POA: Diagnosis not present

## 2021-12-07 MED ORDER — GABAPENTIN 600 MG PO TABS: 900.0000 mg | ORAL_TABLET | Freq: Two times a day (BID) | ORAL | Status: DC

## 2021-12-07 MED ORDER — ALBUTEROL SULFATE HFA 108 (90 BASE) MCG/ACT IN AERS: 2.0000 | INHALATION_SPRAY | Freq: Four times a day (QID) | RESPIRATORY_TRACT | 5 refills | Status: DC | PRN

## 2021-12-07 MED ORDER — GABAPENTIN 800 MG PO TABS: 800.0000 mg | ORAL_TABLET | Freq: Two times a day (BID) | ORAL | 1 refills | Status: DC

## 2021-12-07 NOTE — Patient Instructions (Signed)
Please review the attached list of medications and notify my office if there are any errors.  ? ?You can increase gabapentin to 1 1/2 x '600mg'$  twice a day until your current bottle runs out, then change to the '800mg'$  tablet twice a day.  ? ?I'll plan on increasing your dose of Fentanyl to 79mg with your next refill unless I hear from you otherwise  ?

## 2021-12-07 NOTE — Progress Notes (Signed)
?  ? ?I,Sulibeya S Dimas,acting as a scribe for Lelon Huh, MD.,have documented all relevant documentation on the behalf of Lelon Huh, MD,as directed by  Lelon Huh, MD while in the presence of Lelon Huh, MD. ? ? ?Established patient visit ? ? ?Patient: Karen [name]   DOB: [birth year]   46 y.o. Female  MRN: [id number] ?Visit Date: 12/07/2021 ? ?Today's healthcare provider: Lelon Huh, MD  ? ?Chief Complaint  ?Patient presents with  ? Depression  ? ?Subjective  ?  ?HPI  ?Follow up for depression ? ?The patient was last seen for this 1 months ago. ?Changes made at last visit include continue escitalopram until seen by psychiatry and Seroquel '25mg'$  daily. ?Patient was seen by Dr. Modesta Messing last week and was restarted on Duloxetine which she has titrated up to '60mg'$  daily. She is still having considerable anxiety and depression. Has follow up with Dr. Modesta Messing in June.  ? ?She reports excellent compliance with treatment. ?She feels that condition is Unchanged. ?She is not having side effects.  ? ? ?  12/07/2021  ?  2:25 PM 12/01/2021  ?  2:47 PM 11/25/2021  ? 10:00 AM 11/05/2021  ?  4:02 PM 03/26/2021  ?  3:01 PM  ?Depression screen PHQ 2/9  ?Decreased Interest '3 1  1 2  '$ ?Down, Depressed, Hopeless '3 1  1 2  '$ ?PHQ - 2 Score '6 2  2 4  '$ ?Altered sleeping '3 2  1 1  '$ ?Tired, decreased energy '3 2  3 3  '$ ?Change in appetite 0 0  3 1  ?Feeling bad or failure about yourself  '3 1  1 2  '$ ?Trouble concentrating '3 1  1 2  '$ ?Moving slowly or fidgety/restless 3 0  0 0  ?Suicidal thoughts 0 0  0 0  ?PHQ-9 Score '21 8  11 13  '$ ?Difficult doing work/chores Very difficult Somewhat difficult  Somewhat difficult Somewhat difficult  ?  ? Information is confidential and restricted. Go to Review Flowsheets to unlock data.  ? ?--------------------------------------------------------------------------------------- ? ?She is also here to follow up on pain management since restarting fentanyl at 92mg at last visit after she had been off of it for  several months due to catotonia. She states she continues to have profound pain and would like to go up on the dose. She was also on a much higher dose of gabapentin prior to her psychotic episode last fall, along with pregabalin.  ? ? ?Medications: ?Outpatient Medications Prior to Visit  ?Medication Sig  ? albuterol (VENTOLIN HFA) 108 (90 Base) MCG/ACT inhaler Inhale 2 puffs into the lungs every 6 (six) hours as needed.  ? celecoxib (CELEBREX) 200 MG capsule TAKE 1 CAPSULE BY MOUTH TWICE A DAY  ? DULoxetine (CYMBALTA) 60 MG capsule Take 1 capsule (60 mg total) by mouth daily. Start after completing 30 mg daily for one week  ? fentaNYL (DURAGESIC) 50 MCG/HR Place 1 patch onto the skin every 3 (three) days.  ? fexofenadine (ALLEGRA) 180 MG tablet Take 180 mg by mouth daily.  ? fluticasone (FLONASE) 50 MCG/ACT nasal spray   ? Fluticasone-Salmeterol (ADVAIR) 250-50 MCG/DOSE AEPB Inhale 1 puff into the lungs every 12 (twelve) hours. Rinse mouth after use  ? gabapentin (NEURONTIN) 600 MG tablet Take 1 tablet (600 mg total) by mouth 2 (two) times daily.  ? haloperidol (HALDOL) 0.5 MG tablet Take 0.5 tablets (0.25 mg total) by mouth 2 (two) times daily.  ? naloxone (NARCAN) nasal spray 4 mg/0.1 mL Place  1 spray into the nose as needed.  ? omeprazole (PRILOSEC) 40 MG capsule Take 1 capsule (40 mg total) by mouth daily.  ? ondansetron (ZOFRAN-ODT) 4 MG disintegrating tablet DISSOLVE 1 TABLET IN MOUTH EVERY 8 HOURS FOR NAUSEA AND VOMITING  ? QUEtiapine (SEROQUEL) 25 MG tablet Take 1 tablet (25 mg total) by mouth at bedtime.  ? Sod Picosulfate-Mag Ox-Cit Acd (CLENPIQ) 10-3.5-12 MG-GM -GM/160ML SOLN Take 320 mLs by mouth as directed.  ? tiZANidine (ZANAFLEX) 4 MG tablet TAKE 0.5-1 TABLETS (2-4 MG TOTAL) BY MOUTH EVERY 8 (EIGHT) HOURS AS NEEDED FOR MUSCLE SPASMS.  ? trimethoprim-polymyxin b (POLYTRIM) ophthalmic solution Place 1 drop into both eyes every 6 (six) hours.  ? DULoxetine (CYMBALTA) 30 MG capsule Take 1 capsule (30 mg  total) by mouth daily for 7 days.  ? QUEtiapine (SEROQUEL) 25 MG tablet Take 25 mg by mouth at bedtime.  ? ?No facility-administered medications prior to visit.  ? ? ?Review of Systems  ?Constitutional:  Positive for activity change and fatigue. Negative for appetite change.  ?Respiratory:  Negative for shortness of breath.   ?Cardiovascular:  Negative for chest pain and palpitations.  ?Musculoskeletal:  Positive for arthralgias and myalgias.  ? ? ?  Objective  ?  ?BP 95/69 (BP Location: Left Arm, Patient Position: Sitting, Cuff Size: Large)   Pulse 73   Temp 98.1 ?F (36.7 ?C)   Resp 16   SpO2 99%  ? ? ?Physical Exam  ?Sitting in motorized wheelchair in NAD. Chest Cta, CV RRR. Trace bipedal edema.  ? Assessment & Plan  ?  ? ?1. DDD (degenerative disc disease), lumbar ? ? ?2. Chronic bilateral back pain, unspecified back location ?She was on higher dose of Fentanyl and gabapentin prior to stopping all her medications while in psychotic episode last fall. She was also taking pregabalin, but would rather not get back on gabapentin/pregabalin combination again. Will increase gabapentin to '800mg'$  BID... she has about 10 days of the '600mg'$  tablet so she would like to take 1 1/2 BID until those are gone before change to '900mg'$  tablets.  ? ?She just had Fentanyl '50mg'$  filled. Anticipate going up to 63mg with next refill unless she gets significant improvement on higher dose of gabapentin.  ? ?3. Depression with anxiety ?Started back on duloxetine and titrated to 60 by psychiatry. May see some improvement with anxiety with increase dose of gabapentin as discussed above. Follow up with psychiatry in June as scheduled.  ? ?4. Ehlers-Danlos syndrome ?She requests tub bench recommended by her physical therapist.  ? ?5. Fibromyalgia ? ? ?6. Mild persistent asthma with exacerbation ?refill ?- albuterol (VENTOLIN HFA) 108 (90 Base) MCG/ACT inhaler; Inhale 2 puffs into the lungs every 6 (six) hours as needed.  Dispense: 18 g;  Refill: 5 ? ?7. Urinary retention ? ? ?8. Urinary incontinence, unspecified type ?Was previously followed by Dr. MLaverta Baltimoreand she request referral to get back in to see him for follow up ?- Ambulatory referral to Urology ? ?Refer dermatology for general skin check.  ? ?Addressed extensive list of chronic and acute medical problems today requiring 48 minutes reviewing her medical record, counseling patient regarding her conditions and coordination of care.    ?   ? ?The entirety of the information documented in the History of Present Illness, Review of Systems and Physical Exam were personally obtained by me. Portions of this information were initially documented by the CMA and reviewed by me for thoroughness and accuracy.   ? ? ?  Lelon Huh, MD  ?[facility] ?[phone] (phone) ?[phone] (fax) ? ?[facility] ?

## 2021-12-09 DIAGNOSIS — R627 Adult failure to thrive: Secondary | ICD-10-CM | POA: Diagnosis not present

## 2021-12-09 DIAGNOSIS — F333 Major depressive disorder, recurrent, severe with psychotic symptoms: Secondary | ICD-10-CM | POA: Diagnosis not present

## 2021-12-09 DIAGNOSIS — E440 Moderate protein-calorie malnutrition: Secondary | ICD-10-CM | POA: Diagnosis not present

## 2021-12-09 DIAGNOSIS — F419 Anxiety disorder, unspecified: Secondary | ICD-10-CM | POA: Diagnosis not present

## 2021-12-09 DIAGNOSIS — Q796 Ehlers-Danlos syndrome, unspecified: Secondary | ICD-10-CM | POA: Diagnosis not present

## 2021-12-09 DIAGNOSIS — M797 Fibromyalgia: Secondary | ICD-10-CM | POA: Diagnosis not present

## 2021-12-10 ENCOUNTER — Telehealth: Payer: Self-pay | Admitting: Family Medicine

## 2021-12-10 NOTE — Telephone Encounter (Addendum)
Pt states someone called her to see where order for a  ?Tub bench is to be sent ?[facility] ?Fax [phone] ?

## 2021-12-12 ENCOUNTER — Other Ambulatory Visit: Payer: Self-pay | Admitting: Family Medicine

## 2021-12-12 DIAGNOSIS — F331 Major depressive disorder, recurrent, moderate: Secondary | ICD-10-CM

## 2021-12-13 ENCOUNTER — Telehealth: Payer: Self-pay

## 2021-12-13 ENCOUNTER — Ambulatory Visit (INDEPENDENT_AMBULATORY_CARE_PROVIDER_SITE_OTHER): Payer: Medicare Other | Admitting: Urology

## 2021-12-13 VITALS — BP 97/64 | HR 64

## 2021-12-13 DIAGNOSIS — R350 Frequency of micturition: Secondary | ICD-10-CM

## 2021-12-13 DIAGNOSIS — N3946 Mixed incontinence: Secondary | ICD-10-CM | POA: Diagnosis not present

## 2021-12-13 DIAGNOSIS — R339 Retention of urine, unspecified: Secondary | ICD-10-CM | POA: Diagnosis not present

## 2021-12-13 LAB — URINALYSIS, COMPLETE
Bilirubin, UA: NEGATIVE
Glucose, UA: NEGATIVE
Ketones, UA: NEGATIVE
Nitrite, UA: NEGATIVE
Protein,UA: NEGATIVE
RBC, UA: NEGATIVE
Specific Gravity, UA: <1.005 — ABNORMAL LOW (ref 1.005–1.030)
Urobilinogen, Ur: 0.2 mg/dL (ref 0.2–1.0)
pH, UA: 6 (ref 5.0–7.5)

## 2021-12-13 LAB — MICROSCOPIC EXAMINATION

## 2021-12-13 MED ORDER — CIPROFLOXACIN HCL 250 MG PO TABS
250.0000 mg | ORAL_TABLET | Freq: Two times a day (BID) | ORAL | 0 refills | Status: AC
Start: 2021-12-13 — End: 2021-12-20

## 2021-12-13 NOTE — Progress Notes (Signed)
? ?12/13/2021 ?3:14 PM  ? ?Karen [name] ?1976/03/15 ?[id number] ? ?Referring provider: Birdie Sons, MD ?[address] ?[address] ?[place],  [place] [address] ? ?Chief Complaint  ?Patient presents with  ? Urinary Frequency  ? ? ?HPI: ?Patient has a 10-year history of flow symptoms.  She must lean forward in order to void.  She likely cannot void unless she leans forward.  The flow was initially steady and then she double or triple voids.  She is never taken medication.  She voids every 1-2 hours gets up once a night.  She wears 1 damp pad a day from intermittent leakage. ?  ?She is Ehlers-Danlos syndrome and is wheelchair-bound because of it.  She gets radiofrequency treatments to her lower back and neck. ?  ?Patient has flow symptoms.  She has done well with them.  She had a recent bladder infection.  Her residual was 178 mL.  I thought I would try her on Flomax as a trial and get a renal ultrasound to make certain she does not have silent hydronephrosis.  I think otherwise we need to have reasonable treatment goal.  I will not be offering sacral nerve stimulation ?  ? Ultrasound within normal limits ?Flow much better.  Still leans forward.  Did not wish to see physical therapy.  Clinically not infected ? ?Today ?Flow still good on Flomax.  She actually went into catatonic shock possibly from her brain or urinary tract infection.  She was leaking since and perhaps during.  Now she wears a number of pads a day was a bit nonspecific.  She was on a lot of medication including fentanyl and gabapentin and had a psychotic episode.  She is followed by psychiatry.  She has fibromyalgia. ? ?In October last year she had a positive urinary tract infection pansensitive and a negative culture since ?  ? ? ?   ? ? ?PMH: ?Past Medical History:  ?Diagnosis Date  ? Ehlers-Danlos syndrome   ? Fibromyalgia   ? Headache   ? Sleep apnea   ? Tics of organic origin   ? Transient alteration of awareness   ? ? ?Surgical  History: ?Past Surgical History:  ?Procedure Laterality Date  ? COLONOSCOPY WITH PROPOFOL N/A 02/13/2018  ? Procedure: COLONOSCOPY WITH PROPOFOL;  Surgeon: [name], MD;  Location: [facility] ENDOSCOPY;  Service: Endoscopy;  Laterality: N/A;  ? [place] OF UTERUS  2009  ? ESOPHAGOGASTRODUODENOSCOPY (EGD) WITH PROPOFOL  02/13/2018  ? Procedure: ESOPHAGOGASTRODUODENOSCOPY (EGD) WITH PROPOFOL;  Surgeon: [name], MD;  Location: [facility] ENDOSCOPY;  Service: Endoscopy;;  ? EYE SURGERY Left 1990  ? 3 Surgeries on left eye to correct crossed eye  ? LAPAROSCOPY Left 2003  ? Fallopian Tube  ? [place]  ? OTHER SURGICAL HISTORY Right 1987  ? 3 Surgeries to repair broken arm  ? OTHER SURGICAL HISTORY Left   ? 3 surgeries on her left thigh as an infant  ? OTHER SURGICAL HISTORY  1998  ? Jaw surgery  ? Right arm surgery  1987  ? x 3 due to fracture  ? TOENAIL EXCISION Bilateral 06/13/2019  ? Procedure: BILATERAL SECOND TOE PARTIAL NAIL ABLATION;  Surgeon: Erle Crocker, MD;  Location: [facility];  Service: Orthopedics;  Laterality: Bilateral;  SURGERY REQUEST TIME 1 HOUR  ? TONSILLECTOMY  12/13/2002  ? ? ?Home Medications:  ?Allergies as of 12/13/2021   ? ?   Reactions  ? Augmentin  [amoxicillin-pot  Clavulanate] Diarrhea  ? Chocolate Flavor   ? Other reaction(s): Other (See Comments) ?Wheezing, acne  ? Demerol [meperidine] Other (See Comments)  ? Slow to wake up when this drug is given.   ? Keflex [cephalexin] Nausea And Vomiting  ? Morphine And Related Nausea And Vomiting  ? Tape Dermatitis  ? Must use paper tape only  ? Toradol [ketorolac Tromethamine] Nausea And Vomiting  ? Amoxicillin   ? Other reaction(s): Unknown  ? Chocolate   ? GI distress  ? Nsaids   ? patient develops ulcers ?Other reaction(s): Other (See Comments) ?patient develops ulcers  ? Strawberry Extract   ? GI distress  ? ?  ? ?  ?Medication List  ?  ? ?  ? Accurate as of Dec 13, 2021  3:14 PM. If you have any  questions, ask your nurse or doctor.  ?  ?  ? ?  ? ?STOP taking these medications   ? ?Clenpiq 10-3.5-12 MG-GM -GM/160ML Soln ?Generic drug: Sod Picosulfate-Mag Ox-Cit Acd ?  ? ?  ? ?TAKE these medications   ? ?albuterol 108 (90 Base) MCG/ACT inhaler ?Commonly known as: Ventolin HFA ?Inhale 2 puffs into the lungs every 6 (six) hours as needed. ?  ?celecoxib 200 MG capsule ?Commonly known as: CELEBREX ?TAKE 1 CAPSULE BY MOUTH TWICE A DAY ?  ?DULoxetine 60 MG capsule ?Commonly known as: CYMBALTA ?Take 1 capsule (60 mg total) by mouth daily. Start after completing 30 mg daily for one week ?What changed: Another medication with the same name was removed. Continue taking this medication, and follow the directions you see here. ?  ?fentaNYL 50 MCG/HR ?Commonly known as: [place] ?Place 1 patch onto the skin every 3 (three) days. ?  ?fexofenadine 180 MG tablet ?Commonly known as: ALLEGRA ?Take 180 mg by mouth daily. ?  ?fluticasone 50 MCG/ACT nasal spray ?Commonly known as: FLONASE ?  ?Fluticasone-Salmeterol 250-50 MCG/DOSE Aepb ?Commonly known as: ADVAIR ?Inhale 1 puff into the lungs every 12 (twelve) hours. Rinse mouth after use ?  ?gabapentin 800 MG tablet ?Commonly known as: NEURONTIN ?Take 1 tablet (800 mg total) by mouth 2 (two) times daily. ?What changed: Another medication with the same name was removed. Continue taking this medication, and follow the directions you see here. ?  ?haloperidol 0.5 MG tablet ?Commonly known as: HALDOL ?Take 0.5 tablets (0.25 mg total) by mouth 2 (two) times daily. ?  ?Na Sulfate-K Sulfate-Mg Sulf 17.5-3.13-1.6 GM/177ML Soln ?See admin instructions. ?  ?naloxone 4 MG/0.1ML Liqd nasal spray kit ?Commonly known as: NARCAN ?Place 1 spray into the nose as needed. ?  ?omeprazole 40 MG capsule ?Commonly known as: PRILOSEC ?Take 1 capsule (40 mg total) by mouth daily. ?  ?ondansetron 4 MG disintegrating tablet ?Commonly known as: ZOFRAN-ODT ?DISSOLVE 1 TABLET IN MOUTH EVERY 8 HOURS FOR  NAUSEA AND VOMITING ?  ?QUEtiapine 25 MG tablet ?Commonly known as: SEROQUEL ?Take 25 mg by mouth at bedtime. ?  ?QUEtiapine 25 MG tablet ?Commonly known as: SEROQUEL ?Take 1 tablet (25 mg total) by mouth at bedtime. ?  ?tiZANidine 4 MG tablet ?Commonly known as: ZANAFLEX ?TAKE 0.5-1 TABLETS (2-4 MG TOTAL) BY MOUTH EVERY 8 (EIGHT) HOURS AS NEEDED FOR MUSCLE SPASMS. ?  ?trimethoprim-polymyxin b ophthalmic solution ?Commonly known as: POLYTRIM ?Place 1 drop into both eyes every 6 (six) hours. ?  ? ?  ? ? ?Allergies:  ?Allergies  ?Allergen Reactions  ? Augmentin  [Amoxicillin-Pot Clavulanate] Diarrhea  ? Chocolate Flavor   ?  Other reaction(s): Other (See Comments) ?Wheezing, acne  ? Demerol [Meperidine] Other (See Comments)  ?  Slow to wake up when this drug is given.   ? Keflex [Cephalexin] Nausea And Vomiting  ? Morphine And Related Nausea And Vomiting  ? Tape Dermatitis  ?  Must use paper tape only  ? Toradol [Ketorolac Tromethamine] Nausea And Vomiting  ? Amoxicillin   ?  Other reaction(s): Unknown  ? Chocolate   ?  GI distress  ? Nsaids   ?  patient develops ulcers ?Other reaction(s): Other (See Comments) ?patient develops ulcers  ? Strawberry Extract   ?  GI distress  ? ? ?Family History: ?Family History  ?Problem Relation Age of Onset  ? Depression Mother   ? Stroke Mother   ? Fibromyalgia Mother   ? Osteoarthritis Mother   ? Asthma Brother   ? Depression Brother   ? Migraines Brother   ? Asperger's syndrome Brother   ? Other Brother   ?     Ehlers-Danlos Syndrome  ? Cancer Maternal Aunt   ? Asperger's syndrome Maternal Aunt   ? Breast cancer Maternal Aunt   ? Heart disease Paternal Aunt   ? Heart disease Paternal Uncle   ? Cervical cancer Maternal Grandmother   ? Osteoporosis Maternal Grandmother   ?     Died at 9  ? Osteoarthritis Maternal Grandmother   ? Colon cancer Maternal Grandfather   ? Asthma Maternal Grandfather   ? Asperger's syndrome Maternal Grandfather   ? Emphysema Maternal Grandfather   ?      Died at 75  ? Prostate cancer Maternal Grandfather   ? Heart attack Paternal Grandfather   ?     Died at 9  ? Asthma Paternal Grandfather   ? Tuberculosis Paternal Grandfather   ? Cancer Cousin   ? Asperger's syndrome Cousin

## 2021-12-13 NOTE — Telephone Encounter (Signed)
Order faxed.

## 2021-12-13 NOTE — Telephone Encounter (Signed)
Copied from [place]. Topic: General - Other ?>> Dec 13, 2021 12:18 PM Pawlus, Brayton Layman A wrote: ?Reason for CRM: [employer] called in requesting additional information regarding the "tub bench" caller requested clinical documentation be faxed over to [phone] ?

## 2021-12-13 NOTE — Addendum Note (Signed)
Addended by: Alvera Novel on: 12/13/2021 03:56 PM ? ? Modules accepted: Orders ? ?

## 2021-12-14 NOTE — Telephone Encounter (Signed)
Please advise. Do you want [place] to send a copy of office note from 12/07/2021?  ?

## 2021-12-15 NOTE — Telephone Encounter (Signed)
Deusti from [facility] is calling to follow up on notes for "tub bench" ? ?She needs notes from the past 90 days that speak of pt impaired mobility. ? ?[phone] ? ?Fax- [phone] ?

## 2021-12-15 NOTE — Telephone Encounter (Signed)
Please advise if ok to send office note from 12/07/2021.  ?

## 2021-12-16 ENCOUNTER — Telehealth: Payer: Self-pay

## 2021-12-16 DIAGNOSIS — F419 Anxiety disorder, unspecified: Secondary | ICD-10-CM | POA: Diagnosis not present

## 2021-12-16 DIAGNOSIS — F333 Major depressive disorder, recurrent, severe with psychotic symptoms: Secondary | ICD-10-CM | POA: Diagnosis not present

## 2021-12-16 DIAGNOSIS — E440 Moderate protein-calorie malnutrition: Secondary | ICD-10-CM | POA: Diagnosis not present

## 2021-12-16 DIAGNOSIS — M797 Fibromyalgia: Secondary | ICD-10-CM | POA: Diagnosis not present

## 2021-12-16 DIAGNOSIS — Q796 Ehlers-Danlos syndrome, unspecified: Secondary | ICD-10-CM | POA: Diagnosis not present

## 2021-12-16 DIAGNOSIS — B9620 Unspecified Escherichia coli [E. coli] as the cause of diseases classified elsewhere: Secondary | ICD-10-CM

## 2021-12-16 DIAGNOSIS — R627 Adult failure to thrive: Secondary | ICD-10-CM | POA: Diagnosis not present

## 2021-12-16 LAB — CULTURE, URINE COMPREHENSIVE

## 2021-12-16 NOTE — Telephone Encounter (Signed)
Pt's mother calls triage line and states that the patient was recently prescribed Cipro, however after reading the side effects of possible tendon rupture and psychosis the patient does not want to take the medication. They are requesting an alternative be sent to [facility] on [address]. They would like to be called when this has been done. Please advise.  ?

## 2021-12-17 MED ORDER — SULFAMETHOXAZOLE-TRIMETHOPRIM 800-160 MG PO TABS: 1.0000 | ORAL_TABLET | Freq: Two times a day (BID) | ORAL | 0 refills | Status: AC

## 2021-12-17 NOTE — Telephone Encounter (Signed)
Dusti w/ [employer] calling to follow up on request for office notes for the "tub bench". ? ?Please fax asap  ?- [phone] ? ?Cb [phone] ?

## 2021-12-17 NOTE — Telephone Encounter (Signed)
Pt mother aware.

## 2021-12-17 NOTE — Telephone Encounter (Signed)
Left message for mother to return my call.

## 2021-12-18 ENCOUNTER — Other Ambulatory Visit: Payer: Self-pay | Admitting: Psychiatry

## 2021-12-21 DIAGNOSIS — F419 Anxiety disorder, unspecified: Secondary | ICD-10-CM | POA: Diagnosis not present

## 2021-12-21 DIAGNOSIS — F333 Major depressive disorder, recurrent, severe with psychotic symptoms: Secondary | ICD-10-CM | POA: Diagnosis not present

## 2021-12-21 DIAGNOSIS — R627 Adult failure to thrive: Secondary | ICD-10-CM | POA: Diagnosis not present

## 2021-12-21 DIAGNOSIS — M797 Fibromyalgia: Secondary | ICD-10-CM | POA: Diagnosis not present

## 2021-12-21 DIAGNOSIS — Q796 Ehlers-Danlos syndrome, unspecified: Secondary | ICD-10-CM | POA: Diagnosis not present

## 2021-12-21 DIAGNOSIS — E440 Moderate protein-calorie malnutrition: Secondary | ICD-10-CM | POA: Diagnosis not present

## 2021-12-23 ENCOUNTER — Other Ambulatory Visit: Payer: Self-pay | Admitting: Psychiatry

## 2021-12-23 DIAGNOSIS — F333 Major depressive disorder, recurrent, severe with psychotic symptoms: Secondary | ICD-10-CM | POA: Diagnosis not present

## 2021-12-23 DIAGNOSIS — R627 Adult failure to thrive: Secondary | ICD-10-CM | POA: Diagnosis not present

## 2021-12-23 DIAGNOSIS — M797 Fibromyalgia: Secondary | ICD-10-CM | POA: Diagnosis not present

## 2021-12-23 DIAGNOSIS — E440 Moderate protein-calorie malnutrition: Secondary | ICD-10-CM | POA: Diagnosis not present

## 2021-12-23 DIAGNOSIS — F419 Anxiety disorder, unspecified: Secondary | ICD-10-CM | POA: Diagnosis not present

## 2021-12-23 DIAGNOSIS — Q796 Ehlers-Danlos syndrome, unspecified: Secondary | ICD-10-CM | POA: Diagnosis not present

## 2021-12-29 ENCOUNTER — Other Ambulatory Visit: Payer: Self-pay | Admitting: Family Medicine

## 2021-12-29 DIAGNOSIS — G8929 Other chronic pain: Secondary | ICD-10-CM

## 2021-12-29 MED ORDER — FENTANYL 75 MCG/HR TD PT72: 1.0000 | MEDICATED_PATCH | TRANSDERMAL | 0 refills | Status: DC

## 2022-01-03 ENCOUNTER — Other Ambulatory Visit: Payer: Self-pay | Admitting: Family Medicine

## 2022-01-03 NOTE — Progress Notes (Unsigned)
01/04/2022 4:59 PM   [name] 03-May-1976 [id number]  Referring provider: Birdie Sons, [address] [place],  [place] [address]  Urological history: 1. rUTI's -Contributing factors of age, incontinence, nonambulatory, incomplete bladder emptying and constipation -CT, 04/2021 - The left kidney demonstrates moderate renal cortical scarring changes.  Marked distention of the bladder is noted. It extends well above the umbilicus. Possible bladder outlet obstruction. No bladder wall thickening or bladder mass. No bladder calculi. -Positive documented urine cultures over the last year  E. coli April 22, 2021  E. coli May 28, 2021  E. coli October 04, 2021  E. coli Dec 13, 2021  2. Incontinence -Contributing factors of age, Ehlers-Danlos syndrome, fibromyalgia, sleep apnea, nonambulatory, asthma, depression and seizures -PVR 150 mL -Tamsulosin 0.4 mg daily  Chief Complaint  Patient presents with   Follow-up    HPI: Karen [name] is a 46 y.o. female who presents today for recheck on urine with her mother, Karen [name].   She has circled frequency, urgency and leakage of urine on her intake sheet.  She states she has noticed a decrease in her frequency, urgency and leakage of urine since completing the antibiotics.  She also has no more dysuria.  She is pleased that she is now back to her baseline urinary symptoms.  Patient denies any modifying or aggravating factors.  Patient denies any gross hematuria, dysuria or suprapubic/flank pain.  Patient denies any fevers, chills, nausea or vomiting.    UA negative   PVR 150 mL   PMH: Past Medical History:  Diagnosis Date   Ehlers-Danlos syndrome    Fibromyalgia    Headache    Sleep apnea    Tics of organic origin    Transient alteration of awareness     Surgical History: Past Surgical History:  Procedure Laterality Date   COLONOSCOPY WITH PROPOFOL N/A 02/13/2018   Procedure: COLONOSCOPY  WITH PROPOFOL;  Surgeon: [name], MD;  Location: [facility] ENDOSCOPY;  Service: Endoscopy;  Laterality: N/A;   DILATION AND CURETTAGE OF UTERUS  2009   ESOPHAGOGASTRODUODENOSCOPY (EGD) WITH PROPOFOL  02/13/2018   Procedure: ESOPHAGOGASTRODUODENOSCOPY (EGD) WITH PROPOFOL;  Surgeon: [name], MD;  Location: [facility];  Service: Endoscopy;;   EYE SURGERY Left 1990   3 Surgeries on left eye to correct crossed eye   LAPAROSCOPY Left 2003   Fallopian Tube   [place]   3 Surgeries to repair broken arm   OTHER SURGICAL HISTORY Left    3 surgeries on her left thigh as an infant   OTHER SURGICAL HISTORY  1998   Jaw surgery   Right arm surgery  1987   x 3 [name] to fracture   TOENAIL EXCISION Bilateral 06/13/2019   Procedure: BILATERAL SECOND TOE PARTIAL NAIL ABLATION;  Surgeon: Erle Crocker, MD;  Location: [facility];  Service: Orthopedics;  Laterality: Bilateral;  SURGERY REQUEST TIME 1 HOUR   TONSILLECTOMY  12/13/2002    Home Medications:  Allergies as of 01/04/2022       Reactions   Augmentin  [amoxicillin-pot Clavulanate] Diarrhea   Chocolate Flavor    Other reaction(s): Other (See Comments) Wheezing, acne   Demerol [meperidine] Other (See Comments)   Slow to wake up when this drug is given.    Keflex [cephalexin] Nausea And Vomiting   Morphine And Related Nausea And Vomiting   Tape Dermatitis   Must use paper tape  only   Toradol [ketorolac Tromethamine] Nausea And Vomiting   Amoxicillin    Other reaction(s): Unknown   Chocolate    GI distress   Nsaids    patient develops ulcers Other reaction(s): Other (See Comments) patient develops ulcers   Strawberry Extract    GI distress        Medication List        Accurate as of Jan 04, 2022  4:59 PM. If you have any questions, ask your nurse or doctor.          albuterol 108 (90 Base) MCG/ACT inhaler Commonly known as: Ventolin HFA Inhale 2  puffs into the lungs every 6 (six) hours as needed.   celecoxib 200 MG capsule Commonly known as: CELEBREX TAKE 1 CAPSULE BY MOUTH TWICE A DAY   DULoxetine 60 MG capsule Commonly known as: CYMBALTA Take 1 capsule (60 mg total) by mouth daily. Start after completing 30 mg daily for one week   fentaNYL 75 MCG/HR Commonly known as: [place] 1 patch onto the skin every 3 (three) days.   fexofenadine 180 MG tablet Commonly known as: ALLEGRA Take 180 mg by mouth daily.   fluticasone 50 MCG/ACT nasal spray Commonly known as: FLONASE   Fluticasone-Salmeterol 250-50 MCG/DOSE Aepb Commonly known as: ADVAIR Inhale 1 puff into the lungs every 12 (twelve) hours. Rinse mouth after use   gabapentin 800 MG tablet Commonly known as: NEURONTIN Take 1 tablet (800 mg total) by mouth 2 (two) times daily.   haloperidol 0.5 MG tablet Commonly known as: HALDOL Take 0.5 tablets (0.25 mg total) by mouth 2 (two) times daily.   Na Sulfate-K Sulfate-Mg Sulf 17.5-3.13-1.6 GM/177ML Soln See admin instructions.   naloxone 4 MG/0.1ML Liqd nasal spray kit Commonly known as: NARCAN Place 1 spray into the nose as needed.   omeprazole 40 MG capsule Commonly known as: PRILOSEC Take 1 capsule (40 mg total) by mouth daily.   ondansetron 4 MG disintegrating tablet Commonly known as: ZOFRAN-ODT DISSOLVE 1 TABLET IN MOUTH EVERY 8 HOURS FOR NAUSEA AND VOMITING   QUEtiapine 25 MG tablet Commonly known as: SEROQUEL Take 25 mg by mouth at bedtime.   QUEtiapine 25 MG tablet Commonly known as: SEROQUEL Take 1 tablet (25 mg total) by mouth at bedtime.   tiZANidine 4 MG tablet Commonly known as: ZANAFLEX TAKE 0.5-1 TABLETS (2-4 MG TOTAL) BY MOUTH EVERY 8 (EIGHT) HOURS AS NEEDED FOR MUSCLE SPASMS.   trimethoprim-polymyxin b ophthalmic solution Commonly known as: POLYTRIM Place 1 drop into both eyes every 6 (six) hours.        Allergies:  Allergies  Allergen Reactions   Augmentin   [Amoxicillin-Pot Clavulanate] Diarrhea   Chocolate Flavor     Other reaction(s): Other (See Comments) Wheezing, acne   Demerol [Meperidine] Other (See Comments)    Slow to wake up when this drug is given.    Keflex [Cephalexin] Nausea And Vomiting   Morphine And Related Nausea And Vomiting   Tape Dermatitis    Must use paper tape only   Toradol [Ketorolac Tromethamine] Nausea And Vomiting   Amoxicillin     Other reaction(s): Unknown   Chocolate     GI distress   Nsaids     patient develops ulcers Other reaction(s): Other (See Comments) patient develops ulcers   Strawberry Extract     GI distress    Family History: Family History  Problem Relation Age of Onset   Depression Mother    Stroke Mother  Fibromyalgia Mother    Osteoarthritis Mother    Asthma Brother    Depression Brother    Migraines Brother    Asperger's syndrome Brother    Other Brother        Ehlers-Danlos Syndrome   Cancer Maternal Aunt    Asperger's syndrome Maternal Aunt    Breast cancer Maternal Aunt    Heart disease Paternal Aunt    Heart disease Paternal Uncle    Cervical cancer Maternal Grandmother    Osteoporosis Maternal Grandmother        Died at 88   Osteoarthritis Maternal Grandmother    Colon cancer Maternal Grandfather    Asthma Maternal Grandfather    Asperger's syndrome Maternal Grandfather    Emphysema Maternal Grandfather        Died at 59   Prostate cancer Maternal Grandfather    Heart attack Paternal Grandfather        Died at 72   Asthma Paternal Grandfather    Tuberculosis Paternal Grandfather    Cancer Cousin    Asperger's syndrome Cousin    Asperger's syndrome Other    Heart Problems Paternal Grandmother        Died at 66   Ehlers-Danlos syndrome Father    Ehlers-Danlos syndrome Brother     Social History:  reports that she has never smoked. She has never used smokeless tobacco. She reports that she does not drink alcohol and does not use drugs.  ROS: Pertinent  ROS in HPI  Physical Exam: BP 111/77   Pulse 81   Constitutional:  Well nourished. Alert and oriented, No acute distress. HEENT: [place] AT, moist mucus membranes.  Trachea midline  Cardiovascular: No clubbing, cyanosis, or edema. Respiratory: Normal respiratory effort, no increased work of breathing. Neurologic: Grossly intact, no focal deficits, in wheelchair  Psychiatric: Normal mood and affect.    Laboratory Data: Lab Results  Component Value Date   WBC 9.4 10/15/2021   HGB 13.8 10/15/2021   HCT 42.2 10/15/2021   MCV 84.4 10/15/2021   PLT 360 10/15/2021    Lab Results  Component Value Date   CREATININE 0.63 10/26/2021   Lab Results  Component Value Date   TSH 1.057 10/05/2021      Component Value Date/Time   CHOL 189 10/15/2019 1023   HDL 70 10/15/2019 1023   LDLCALC 108 (H) 10/15/2019 1023    Lab Results  Component Value Date   AST 41 10/04/2021   Lab Results  Component Value Date   ALT 30 10/04/2021    Urinalysis Results for orders placed or performed in visit on 01/04/22  Microscopic Examination   Urine  Result Value Ref Range   WBC, UA None seen 0 - 5 /hpf   RBC None seen 0 - 2 /hpf   Epithelial Cells (non renal) 0-10 0 - 10 /hpf   Bacteria, UA None seen None seen/Few  Urinalysis, Complete  Result Value Ref Range   Specific Gravity, UA <1.005 (L) 1.005 - 1.030   pH, UA 6.0 5.0 - 7.5   Color, UA Yellow Yellow   Appearance Ur Clear Clear   Leukocytes,UA Negative Negative   Protein,UA Negative Negative/Trace   Glucose, UA Negative Negative   Ketones, UA Negative Negative   RBC, UA Negative Negative   Bilirubin, UA Negative Negative   Urobilinogen, Ur 0.2 0.2 - 1.0 mg/dL   Nitrite, UA Negative Negative   Microscopic Examination See below:   Bladder Scan (Post Void Residual) in office  Result Value Ref Range   Scan Result 110m     I have reviewed the labs.   Assessment & Plan:    1. rUTI's -Current infection resolved -Continue  preventative strategies  2. Incontinence -Back at baseline symptoms -Does not want to pursue further treatment of incontinence at this time [name] to fear of a return of her psychosis  Return in about 6 months (around 07/07/2022) for PVR .  These notes generated with voice recognition software. I apologize for typographical errors.  SZara Council PA-C  [facility] [address] [place] [phone]

## 2022-01-04 ENCOUNTER — Ambulatory Visit (INDEPENDENT_AMBULATORY_CARE_PROVIDER_SITE_OTHER): Payer: Medicare Other | Admitting: Urology

## 2022-01-04 ENCOUNTER — Encounter: Payer: Self-pay | Admitting: Urology

## 2022-01-04 VITALS — BP 111/77 | HR 81

## 2022-01-04 DIAGNOSIS — N3946 Mixed incontinence: Secondary | ICD-10-CM | POA: Diagnosis not present

## 2022-01-04 DIAGNOSIS — N390 Urinary tract infection, site not specified: Secondary | ICD-10-CM | POA: Diagnosis not present

## 2022-01-04 DIAGNOSIS — Z87440 Personal history of urinary (tract) infections: Secondary | ICD-10-CM | POA: Diagnosis not present

## 2022-01-04 DIAGNOSIS — R339 Retention of urine, unspecified: Secondary | ICD-10-CM

## 2022-01-04 LAB — MICROSCOPIC EXAMINATION
Bacteria, UA: NONE SEEN
RBC, Urine: NONE SEEN /hpf (ref 0–2)
WBC, UA: NONE SEEN /hpf (ref 0–5)

## 2022-01-04 LAB — BLADDER SCAN AMB NON-IMAGING

## 2022-01-04 LAB — URINALYSIS, COMPLETE
Bilirubin, UA: NEGATIVE
Glucose, UA: NEGATIVE
Ketones, UA: NEGATIVE
Leukocytes,UA: NEGATIVE
Nitrite, UA: NEGATIVE
Protein,UA: NEGATIVE
RBC, UA: NEGATIVE
Specific Gravity, UA: <1.005 — ABNORMAL LOW (ref 1.005–1.030)
Urobilinogen, Ur: 0.2 mg/dL (ref 0.2–1.0)
pH, UA: 6 (ref 5.0–7.5)

## 2022-01-05 ENCOUNTER — Telehealth: Payer: Self-pay | Admitting: Family Medicine

## 2022-01-05 NOTE — Telephone Encounter (Signed)
Medication Refill - Medication: fentaNYL ([place]) 75 MCG/HR Pt asked for this to be done today / she needs to change her patch today   Has the patient contacted their pharmacy? Yes.   (Agent: If no, request that the patient contact the pharmacy for the refill. If patient does not wish to contact the pharmacy document the reason why and proceed with request.) (Agent: If yes, when and what did the pharmacy advise?)  Preferred Pharmacy (with phone number or street name): [name] [place] Phone:  [phone] Fax:  [phone] Has the patient been seen for an appointment in the last year OR does the patient have an upcoming appointment? Yes.    Agent: Please be advised that RX refills may take up to 3 business days. We ask that you follow-up with your pharmacy.

## 2022-01-07 ENCOUNTER — Encounter: Payer: Self-pay | Admitting: Family Medicine

## 2022-01-07 ENCOUNTER — Ambulatory Visit (INDEPENDENT_AMBULATORY_CARE_PROVIDER_SITE_OTHER): Payer: Medicare Other | Admitting: Family Medicine

## 2022-01-07 VITALS — BP 108/79 | HR 70 | Temp 97.7°F | Resp 12

## 2022-01-07 DIAGNOSIS — J301 Allergic rhinitis due to pollen: Secondary | ICD-10-CM

## 2022-01-07 DIAGNOSIS — G8929 Other chronic pain: Secondary | ICD-10-CM | POA: Diagnosis not present

## 2022-01-07 DIAGNOSIS — Z1231 Encounter for screening mammogram for malignant neoplasm of breast: Secondary | ICD-10-CM

## 2022-01-07 DIAGNOSIS — M549 Dorsalgia, unspecified: Secondary | ICD-10-CM

## 2022-01-07 DIAGNOSIS — Z803 Family history of malignant neoplasm of breast: Secondary | ICD-10-CM

## 2022-01-07 DIAGNOSIS — F418 Other specified anxiety disorders: Secondary | ICD-10-CM | POA: Diagnosis not present

## 2022-01-07 MED ORDER — GABAPENTIN 800 MG PO TABS: ORAL_TABLET | ORAL | Status: DC

## 2022-01-07 NOTE — Progress Notes (Signed)
I,Roshena L Chambers,acting as a scribe for Lelon Huh, MD.,have documented all relevant documentation on the behalf of Lelon Huh, MD,as directed by  Lelon Huh, MD while in the presence of Lelon Huh, MD.    Established patient visit   Patient: Karen [name]   DOB: May 10, 1976   46 y.o. Female  MRN: [id number] Visit Date: 01/07/2022  Today's healthcare provider: Lelon Huh, MD   Chief Complaint  Patient presents with   Back Pain   Subjective    HPI  Follow up for chronic bilateral back pain:  The patient was last seen for this 1  month  ago. Changes made at last visit include increasing gabapentin to '800mg'$  BID  She reports good compliance with treatment. She feels that condition is Unchanged. Patient is currently taking Gabapentin 800 mg twice daily. She is also using Fentanyl 75 mcg patches every 3 days. She is not having side effects.   -----------------------------------------------------------------------------------------   Medications: Outpatient Medications Prior to Visit  Medication Sig   albuterol (VENTOLIN HFA) 108 (90 Base) MCG/ACT inhaler Inhale 2 puffs into the lungs every 6 (six) hours as needed.   celecoxib (CELEBREX) 200 MG capsule TAKE 1 CAPSULE BY MOUTH TWICE A DAY   DULoxetine (CYMBALTA) 60 MG capsule Take 1 capsule (60 mg total) by mouth daily. Start after completing 30 mg daily for one week   fentaNYL (DURAGESIC) 75 MCG/HR Place 1 patch onto the skin every 3 (three) days.   fexofenadine (ALLEGRA) 180 MG tablet Take 180 mg by mouth daily.   fluticasone (FLONASE) 50 MCG/ACT nasal spray    Fluticasone-Salmeterol (ADVAIR) 250-50 MCG/DOSE AEPB Inhale 1 puff into the lungs every 12 (twelve) hours. Rinse mouth after use   gabapentin (NEURONTIN) 800 MG tablet Take 1 tablet (800 mg total) by mouth 2 (two) times daily.   haloperidol (HALDOL) 0.5 MG tablet Take 0.5 tablets (0.25 mg total) by mouth 2 (two) times daily.   Na Sulfate-K  Sulfate-Mg Sulf 17.5-3.13-1.6 GM/177ML SOLN See admin instructions.   naloxone (NARCAN) nasal spray 4 mg/0.1 mL Place 1 spray into the nose as needed.   omeprazole (PRILOSEC) 40 MG capsule Take 1 capsule (40 mg total) by mouth daily.   ondansetron (ZOFRAN-ODT) 4 MG disintegrating tablet DISSOLVE 1 TABLET IN MOUTH EVERY 8 HOURS FOR NAUSEA AND VOMITING   QUEtiapine (SEROQUEL) 25 MG tablet Take 25 mg by mouth at bedtime.   QUEtiapine (SEROQUEL) 25 MG tablet Take 1 tablet (25 mg total) by mouth at bedtime.   tiZANidine (ZANAFLEX) 4 MG tablet TAKE 0.5-1 TABLETS (2-4 MG TOTAL) BY MOUTH EVERY 8 (EIGHT) HOURS AS NEEDED FOR MUSCLE SPASMS.   trimethoprim-polymyxin b (POLYTRIM) ophthalmic solution Place 1 drop into both eyes every 6 (six) hours.   No facility-administered medications prior to visit.    Review of Systems  Constitutional:  Negative for appetite change, chills, fatigue and fever.  Respiratory:  Negative for chest tightness and shortness of breath.   Cardiovascular:  Negative for chest pain and palpitations.  Gastrointestinal:  Negative for abdominal pain, nausea and vomiting.  Musculoskeletal:  Positive for back pain.  Neurological:  Negative for dizziness and weakness.      Objective    BP 108/79 (BP Location: Left Arm, Patient Position: Sitting, Cuff Size: Normal)   Pulse 70   Temp 97.7 F (36.5 C) (Oral)   Resp 12   SpO2 100% Comment: room air   Physical Exam   General: Appearance:    Well  developed, well nourished female in no acute distress  Eyes:    PERRL, conjunctiva/corneas clear, EOM's intact       Lungs:     Clear to auscultation bilaterally, respirations unlabored  Heart:    Normal heart rate. Normal rhythm. No murmurs, rubs, or gallops.    MS:   All extremities are intact.    Neurologic:   Awake, alert, oriented x 3. No apparent focal neurological defect.         Assessment & Plan     1. Chronic bilateral back pain, unspecified back location Somewhat  improved, but not controlled, can increase gabapentin to TID.   2. Depression with anxiety Stable on current medications.   3. Family history of breast cancer Given contact information to schedule mammogram at [facility].   4. Seasonal allergic rhinitis due to pollen  - Ambulatory referral to Allergy      The entirety of the information documented in the History of Present Illness, Review of Systems and Physical Exam were personally obtained by me. Portions of this information were initially documented by the CMA and reviewed by me for thoroughness and accuracy.     Lelon Huh, MD  [facility] [phone] (phone) [phone] (fax)  [place]

## 2022-01-11 ENCOUNTER — Ambulatory Visit: Payer: Medicare Other | Admitting: Anesthesiology

## 2022-01-11 ENCOUNTER — Ambulatory Visit
Admission: RE | Admit: 2022-01-11 | Discharge: 2022-01-11 | Disposition: A | Discharge status: Home / Self Care | Payer: Medicare Other | Attending: Gastroenterology | Admitting: Gastroenterology

## 2022-01-11 ENCOUNTER — Encounter: Payer: Self-pay | Admitting: Gastroenterology

## 2022-01-11 ENCOUNTER — Encounter
Admission: RE | Disposition: A | Discharge status: Home / Self Care | Payer: Self-pay | Source: Home / Self Care | Attending: Gastroenterology

## 2022-01-11 ENCOUNTER — Other Ambulatory Visit: Payer: Self-pay

## 2022-01-11 DIAGNOSIS — G4730 Sleep apnea, unspecified: Secondary | ICD-10-CM | POA: Insufficient documentation

## 2022-01-11 DIAGNOSIS — K621 Rectal polyp: Secondary | ICD-10-CM | POA: Diagnosis not present

## 2022-01-11 DIAGNOSIS — M797 Fibromyalgia: Secondary | ICD-10-CM | POA: Diagnosis not present

## 2022-01-11 DIAGNOSIS — Z1211 Encounter for screening for malignant neoplasm of colon: Secondary | ICD-10-CM | POA: Insufficient documentation

## 2022-01-11 DIAGNOSIS — K640 First degree hemorrhoids: Secondary | ICD-10-CM | POA: Diagnosis not present

## 2022-01-11 DIAGNOSIS — K635 Polyp of colon: Secondary | ICD-10-CM | POA: Diagnosis not present

## 2022-01-11 DIAGNOSIS — M1990 Unspecified osteoarthritis, unspecified site: Secondary | ICD-10-CM | POA: Insufficient documentation

## 2022-01-11 DIAGNOSIS — J45909 Unspecified asthma, uncomplicated: Secondary | ICD-10-CM | POA: Diagnosis not present

## 2022-01-11 DIAGNOSIS — R519 Headache, unspecified: Secondary | ICD-10-CM | POA: Insufficient documentation

## 2022-01-11 DIAGNOSIS — R569 Unspecified convulsions: Secondary | ICD-10-CM | POA: Insufficient documentation

## 2022-01-11 DIAGNOSIS — Z860100 Personal history of colon polyps, unspecified: Secondary | ICD-10-CM

## 2022-01-11 DIAGNOSIS — Q796 Ehlers-Danlos syndrome, unspecified: Secondary | ICD-10-CM | POA: Insufficient documentation

## 2022-01-11 DIAGNOSIS — D123 Benign neoplasm of transverse colon: Secondary | ICD-10-CM | POA: Diagnosis not present

## 2022-01-11 DIAGNOSIS — F333 Major depressive disorder, recurrent, severe with psychotic symptoms: Secondary | ICD-10-CM | POA: Diagnosis not present

## 2022-01-11 HISTORY — PX: COLONOSCOPY WITH PROPOFOL: SHX5780

## 2022-01-11 SURGERY — COLONOSCOPY WITH PROPOFOL
Anesthesia: General

## 2022-01-11 MED ORDER — LIDOCAINE HCL (CARDIAC) PF 100 MG/5ML IV SOSY
PREFILLED_SYRINGE | INTRAVENOUS | Status: DC | PRN
  Administered 2022-01-11: 50 mg via INTRAVENOUS

## 2022-01-11 MED ORDER — SODIUM CHLORIDE 0.9 % IV SOLN
INTRAVENOUS | Status: DC
Start: 2022-01-11 — End: 2022-01-11

## 2022-01-11 MED ORDER — PROPOFOL 500 MG/50ML IV EMUL
INTRAVENOUS | Status: DC | PRN
  Administered 2022-01-11: 150 ug/kg/min via INTRAVENOUS

## 2022-01-11 MED ORDER — SODIUM CHLORIDE 0.9 % IV SOLN
INTRAVENOUS | Status: DC
  Administered 2022-01-11: 1000 mL via INTRAVENOUS

## 2022-01-11 MED ORDER — PROPOFOL 10 MG/ML IV BOLUS
INTRAVENOUS | Status: DC | PRN
  Administered 2022-01-11: 60 mg via INTRAVENOUS

## 2022-01-11 NOTE — Anesthesia Procedure Notes (Signed)
Procedure Name: MAC Date/Time: 01/11/2022 11:04 AM Performed by: Biagio Borg, CRNA Pre-anesthesia Checklist: Patient identified, Emergency Drugs available, Suction available, Patient being monitored and Timeout performed Patient Re-evaluated:Patient Re-evaluated prior to induction Oxygen Delivery Method: Nasal cannula Induction Type: IV induction Placement Confirmation: positive ETCO2 and CO2 detector

## 2022-01-11 NOTE — H&P (Signed)
[name], MD [address] [place],  [address] Phone:[phone] Fax : [phone]  Primary Care Physician:  Birdie Sons, MD Primary Gastroenterologist:  Dr. Allen Norris  Pre-Procedure History & Physical: HPI:  Karen [name] is a 46 y.o. female is here for an colonoscopy.   Past Medical History:  Diagnosis Date   Ehlers-Danlos syndrome    Fibromyalgia    Headache    Sleep apnea    Tics of organic origin    Transient alteration of awareness     Past Surgical History:  Procedure Laterality Date   COLONOSCOPY WITH PROPOFOL N/A 02/13/2018   Procedure: COLONOSCOPY WITH PROPOFOL;  Surgeon: [name], MD;  Location: [facility]COPY;  Service: Endoscopy;  Laterality: N/A;   DILATION AND CURETTAGE OF UTERUS  08/16/2007   ESOPHAGOGASTRODUODENOSCOPY (EGD) WITH PROPOFOL  02/13/2018   Procedure: ESOPHAGOGASTRODUODENOSCOPY (EGD) WITH PROPOFOL;  Surgeon: [name], MD;  Location: [facility];  Service: Endoscopy;;   EYE SURGERY Left 08/15/1988   3 Surgeries on left eye to correct crossed eye   LAPAROSCOPY Left 08/15/2001   Fallopian Tube   MANDIBLE SURGERY  08/15/1996   OTHER SURGICAL HISTORY Right 08/15/1985   3 Surgeries to repair broken arm   OTHER SURGICAL HISTORY Left    3 surgeries on her left thigh as an infant   OTHER SURGICAL HISTORY  08/15/1996   Jaw surgery   Right arm surgery  08/15/1985   x 3 due to fracture   TOENAIL EXCISION Bilateral 06/13/2019   Procedure: BILATERAL SECOND TOE PARTIAL NAIL ABLATION;  Surgeon: Erle Crocker, MD;  Location: [facility];  Service: Orthopedics;  Laterality: Bilateral;  SURGERY REQUEST TIME 1 HOUR   TONSILLECTOMY  12/13/2002   TONSILLECTOMY      Prior to Admission medications   Medication Sig Start Date End Date Taking? Authorizing Provider  celecoxib (CELEBREX) 200 MG capsule TAKE 1 CAPSULE BY MOUTH TWICE A DAY 01/03/22  Yes Birdie Sons, MD  DULoxetine (CYMBALTA) 60 MG  capsule Take 1 capsule (60 mg total) by mouth daily. Start after completing 30 mg daily for one week 12/02/21 01/31/22 Yes Hisada, Elie Goody, MD  fentaNYL (DURAGESIC) 75 MCG/HR Place 1 patch onto the skin every 3 (three) days. 12/29/21  Yes Birdie Sons, MD  fexofenadine (ALLEGRA) 180 MG tablet Take 180 mg by mouth daily.   Yes [provider]  fluticasone Asencion Islam) 50 MCG/ACT nasal spray  05/12/21  Yes [provider]  Fluticasone-Salmeterol (ADVAIR) 250-50 MCG/DOSE AEPB Inhale 1 puff into the lungs every 12 (twelve) hours. Rinse mouth after use 04/02/20  Yes Young, Tarri Fuller D, MD  gabapentin (NEURONTIN) 800 MG tablet Take one in the morning and two at night 01/07/22  Yes Fisher, Kirstie Peri, MD  haloperidol (HALDOL) 0.5 MG tablet Take 0.5 tablets (0.25 mg total) by mouth 2 (two) times daily. 07/19/21  Yes Goodpasture, [name], NP  Na Sulfate-K Sulfate-Mg Sulf 17.5-3.13-1.6 GM/177ML SOLN See admin instructions. 12/02/21  Yes [provider]  omeprazole (PRILOSEC) 40 MG capsule Take 1 capsule (40 mg total) by mouth daily. 11/05/21  Yes Birdie Sons, MD  ondansetron (ZOFRAN-ODT) 4 MG disintegrating tablet DISSOLVE 1 TABLET IN MOUTH EVERY 8 HOURS FOR NAUSEA AND VOMITING 09/29/20  Yes Birdie Sons, MD  QUEtiapine (SEROQUEL) 25 MG tablet Take 25 mg by mouth at bedtime. 10/29/21  Yes [provider]  trimethoprim-polymyxin b (POLYTRIM) ophthalmic solution Place 1 drop into both eyes every 6 (six)  hours. 11/10/21  Yes [name], PA-C  albuterol (VENTOLIN HFA) 108 (90 Base) MCG/ACT inhaler Inhale 2 puffs into the lungs every 6 (six) hours as needed. 12/07/21   Birdie Sons, MD  naloxone [facility]) nasal spray 4 mg/0.1 mL Place 1 spray into the nose as needed. 04/15/21   [provider]  QUEtiapine (SEROQUEL) 25 MG tablet Take 1 tablet (25 mg total) by mouth at bedtime. 11/25/21 01/24/22  Norman Clay, MD  tiZANidine (ZANAFLEX) 4 MG tablet TAKE 0.5-1 TABLETS (2-4 MG  TOTAL) BY MOUTH EVERY 8 (EIGHT) HOURS AS NEEDED FOR MUSCLE SPASMS. Patient not taking: Reported on 01/11/2022 04/14/21   Birdie Sons, MD    Allergies as of 01/11/2022 - Review Complete 01/11/2022  Allergen Reaction Noted   Augmentin  [amoxicillin-pot clavulanate] Diarrhea 03/10/2015   Chocolate flavor  03/18/2014   Demerol [meperidine] Other (See Comments) 01/27/2014   Keflex [cephalexin] Nausea And Vomiting 01/27/2014   Morphine and related Nausea And Vomiting 01/27/2014   Tape Dermatitis 07/29/2015   Toradol [ketorolac tromethamine] Nausea And Vomiting 01/27/2014   Amoxicillin  04/01/2015   Chocolate  03/10/2015   Nsaids  03/10/2015   Strawberry extract  03/10/2015    Family History  Problem Relation Age of Onset   Depression Mother    Stroke Mother    Fibromyalgia Mother    Osteoarthritis Mother    Asthma Brother    Depression Brother    Migraines Brother    Asperger's syndrome Brother    Other Brother        Ehlers-Danlos Syndrome   Cancer Maternal Aunt    Asperger's syndrome Maternal Aunt    Breast cancer Maternal Aunt    Heart disease Paternal Aunt    Heart disease Paternal Uncle    Cervical cancer Maternal Grandmother    Osteoporosis Maternal Grandmother        Died at 70   Osteoarthritis Maternal Grandmother    Colon cancer Maternal Grandfather    Asthma Maternal Grandfather    Asperger's syndrome Maternal Grandfather    Emphysema Maternal Grandfather        Died at 65   Prostate cancer Maternal Grandfather    Heart attack Paternal Grandfather        Died at 55   Asthma Paternal Grandfather    Tuberculosis Paternal Grandfather    Cancer Cousin    Asperger's syndrome Cousin    Asperger's syndrome Other    Heart Problems Paternal Grandmother        Died at 75   Ehlers-Danlos syndrome Father    Ehlers-Danlos syndrome Brother     Social History   Socioeconomic History   Marital status: Single    Spouse name: Not on file   Number of children: 0    Years of education: Not on file   Highest education level: Bachelor's degree (e.g., BA, AB, BS)  Occupational History   Not on file  Tobacco Use   Smoking status: Never   Smokeless tobacco: Never  Vaping Use   Vaping Use: Never used  Substance and Sexual Activity   Alcohol use: No    Alcohol/week: 0.0 standard drinks   Drug use: No   Sexual activity: Never  Other Topics Concern   Not on file  Social History Narrative   Karen [name] is 84.   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    She lives with her mother.    She enjoys reading, watching TV, writing, and painting.  Social Determinants of Health   Financial Resource Strain: Low Risk    Difficulty of Paying Living Expenses: Not very hard  Food Insecurity: No Food Insecurity   Worried About Charity fundraiser in the Last Year: Never true   Ran Out of Food in the Last Year: Never true  Transportation Needs: No Transportation Needs   Lack of Transportation (Medical): No   Lack of Transportation (Non-Medical): No  Physical Activity: Insufficiently Active   Days of Exercise per Week: 2 days   Minutes of Exercise per Session: 30 min  Stress: No Stress Concern Present   Feeling of Stress : Not at all  Social Connections: Socially Isolated   Frequency of Communication with Friends and Family: Twice a week   Frequency of Social Gatherings with Friends and Family: Once a week   Attends Religious Services: Never   Marine scientist or Organizations: No   Attends Music therapist: Never   Marital Status: Never married  Human resources officer Violence: Not At Risk   Fear of Current or Ex-Partner: No   Emotionally Abused: No   Physically Abused: No   Sexually Abused: No    Review of Systems: See HPI, otherwise negative ROS  Physical Exam: BP 118/89   Pulse 72   Temp (!) 96.3 F (35.7 C) (Temporal)   Resp 16   Ht 4' 11.5" (1.511 m)   Wt 57.3 kg   SpO2 98%   BMI 25.07 kg/m  General:   Alert,  pleasant and  cooperative in NAD Head:  Normocephalic and atraumatic. Neck:  Supple; no masses or thyromegaly. Lungs:  Clear throughout to auscultation.    Heart:  Regular rate and rhythm. Abdomen:  Soft, nontender and nondistended. Normal bowel sounds, without guarding, and without rebound.   Neurologic:  Alert and  oriented x4;  grossly normal neurologically.  Impression/Plan: DALAYAH DEAHL is here for an colonoscopy to be performed for a history of adenomatous polyps on 2019   Risks, benefits, limitations, and alternatives regarding  colonoscopy have been reviewed with the patient.  Questions have been answered.  All parties agreeable.   [name], MD  01/11/2022, 10:52 AM

## 2022-01-11 NOTE — Transfer of Care (Signed)
Immediate Anesthesia Transfer of Care Note  Patient: Karen [name]  Procedure(s) Performed: COLONOSCOPY WITH PROPOFOL  Patient Location: PACU and Endoscopy Unit  Anesthesia Type:General  Level of Consciousness: awake  Airway & Oxygen Therapy: Patient Spontanous Breathing  Post-op Assessment: Report given to RN and Post -op Vital signs reviewed and stable  Post vital signs: Reviewed and stable  Last Vitals:  Vitals Value Taken Time  BP 91/66 01/11/22 1119  Temp 35.9 C 01/11/22 1119  Pulse 66 01/11/22 1123  Resp 13 01/11/22 1123  SpO2 97 % 01/11/22 1123  Vitals shown include unvalidated device data.  Last Pain:  Vitals:   01/11/22 1119  TempSrc: Temporal  PainSc: 0-No pain         Complications: No notable events documented.

## 2022-01-11 NOTE — Anesthesia Preprocedure Evaluation (Signed)
Anesthesia Evaluation  Patient identified by MRN, date of birth, ID band Patient awake    Reviewed: Allergy & Precautions, NPO status , Patient's Chart, lab work & pertinent test results  History of Anesthesia Complications Negative for: history of anesthetic complications  Airway Mallampati: III  TM Distance: <3 FB Neck ROM: full    Dental  (+) Chipped, Poor Dentition, Missing   Pulmonary asthma , sleep apnea and Continuous Positive Airway Pressure Ventilation ,    Pulmonary exam normal        Cardiovascular Exercise Tolerance: Poor (-) anginaNormal cardiovascular exam     Neuro/Psych  Headaches, Seizures -,  PSYCHIATRIC DISORDERS  Neuromuscular disease    GI/Hepatic negative GI ROS, Neg liver ROS, neg GERD  ,  Endo/Other  negative endocrine ROS  Renal/GU negative Renal ROS  negative genitourinary   Musculoskeletal  (+) Arthritis , Fibromyalgia -  Abdominal   Peds  Hematology negative hematology ROS (+)   Anesthesia Other Findings Past Medical History: No date: Ehlers-Danlos syndrome No date: Fibromyalgia No date: Headache No date: Sleep apnea No date: Tics of organic origin No date: Transient alteration of awareness  Past Surgical History: 02/13/2018: COLONOSCOPY WITH PROPOFOL; N/A     Comment:  Procedure: COLONOSCOPY WITH PROPOFOL;  Surgeon: [name], MD;  Location: [facility] ENDOSCOPY;  Service:               Endoscopy;  Laterality: N/A; 08/16/2007: DILATION AND CURETTAGE OF UTERUS 02/13/2018: ESOPHAGOGASTRODUODENOSCOPY (EGD) WITH PROPOFOL     Comment:  Procedure: ESOPHAGOGASTRODUODENOSCOPY (EGD) WITH               PROPOFOL;  Surgeon: [name], MD;  Location: [facility]               ENDOSCOPY;  Service: Endoscopy;; 08/15/1988: EYE SURGERY; Left     Comment:  3 Surgeries on left eye to correct crossed eye 08/15/2001: LAPAROSCOPY; Left     Comment:  Fallopian Tube 08/15/1996: MANDIBLE  SURGERY 08/15/1985: OTHER SURGICAL HISTORY; Right     Comment:  3 Surgeries to repair broken arm No date: OTHER SURGICAL HISTORY; Left     Comment:  3 surgeries on her left thigh as an infant 08/15/1996: OTHER SURGICAL HISTORY     Comment:  Jaw surgery 08/15/1985: Right arm surgery     Comment:  x 3 due to fracture 06/13/2019: TOENAIL EXCISION; Bilateral     Comment:  Procedure: BILATERAL SECOND TOE PARTIAL NAIL ABLATION;                Surgeon: Erle Crocker, MD;  Location: [place];  Service: Orthopedics;  Laterality:               Bilateral;  SURGERY REQUEST TIME 1 HOUR 12/13/2002: TONSILLECTOMY No date: TONSILLECTOMY  BMI    Body Mass Index: 25.07 kg/m      Reproductive/Obstetrics negative OB ROS                             Anesthesia Physical Anesthesia Plan  ASA: 3  Anesthesia Plan: General   Post-op Pain Management:    Induction: Intravenous  PONV Risk Score and Plan: Propofol infusion and TIVA  Airway Management Planned: Natural Airway and Nasal Cannula  Additional Equipment:  Intra-op Plan:   Post-operative Plan:   Informed Consent: I have reviewed the patients History and Physical, chart, labs and discussed the procedure including the risks, benefits and alternatives for the proposed anesthesia with the patient or authorized representative who has indicated his/her understanding and acceptance.     Dental Advisory Given  Plan Discussed with: Anesthesiologist, CRNA and Surgeon  Anesthesia Plan Comments: (Patient consented for risks of anesthesia including but not limited to:  - adverse reactions to medications - risk of airway placement if required - damage to eyes, teeth, lips or other oral mucosa - nerve damage due to positioning  - sore throat or hoarseness - Damage to heart, brain, nerves, lungs, other parts of body or loss of life  Patient voiced understanding.)         Anesthesia Quick Evaluation

## 2022-01-11 NOTE — Op Note (Signed)
[facility] Patient Name: Khamani Daniely Procedure Date: 01/11/2022 10:59 AM MRN: [id number] Account #: [id number] Date of Birth: 1976/04/20 Admit Type: Outpatient Age: 46 Room: [facility] ENDO ROOM 4 Gender: Female Note Status: Finalized Instrument Name: Jasper Riling [id number] Procedure:             Colonoscopy Indications:           High risk colon cancer surveillance: Personal history                         of colonic polyps Providers:             [name] MD, MD Referring MD:          Kirstie Peri. Caryn Section, MD (Referring MD) Medicines:             Propofol per Anesthesia Complications:         No immediate complications. Procedure:             Pre-Anesthesia Assessment:                        - Prior to the procedure, a History and Physical was                         performed, and patient medications and allergies were                         reviewed. The patient's tolerance of previous                         anesthesia was also reviewed. The risks and benefits                         of the procedure and the sedation options and risks                         were discussed with the patient. All questions were                         answered, and informed consent was obtained. Prior                         Anticoagulants: The patient has taken no previous                         anticoagulant or antiplatelet agents. ASA Grade                         Assessment: II - A patient with mild systemic disease.                         After reviewing the risks and benefits, the patient                         was deemed in satisfactory condition to undergo the                         procedure.  After obtaining informed consent, the colonoscope was                         passed under direct vision. Throughout the procedure,                         the patient's blood pressure, pulse, and oxygen                         saturations  were monitored continuously. The                         Colonoscope was introduced through the anus and                         advanced to the the cecum, identified by appendiceal                         orifice and ileocecal valve. The colonoscopy was                         performed without difficulty. The patient tolerated                         the procedure well. The quality of the bowel                         preparation was excellent. Findings:      The perianal and digital rectal examinations were normal.      A 4 mm polyp was found in the descending colon. The polyp was sessile.       The polyp was removed with a cold snare. Resection and retrieval were       complete.      A 4 mm polyp was found in the rectum. The polyp was sessile. The polyp       was removed with a cold snare. Resection and retrieval were complete.      Non-bleeding internal hemorrhoids were found during retroflexion. The       hemorrhoids were Grade I (internal hemorrhoids that do not prolapse). Impression:            - One 4 mm polyp in the descending colon, removed with                         a cold snare. Resected and retrieved.                        - One 4 mm polyp in the rectum, removed with a cold                         snare. Resected and retrieved.                        - Non-bleeding internal hemorrhoids. Recommendation:        - Discharge patient to home.                        - Resume previous diet.                        -  Continue present medications.                        - Await pathology results.                        - Repeat colonoscopy in 5 years for surveillance. Procedure Code(s):     --- Professional ---                        [phone], Colonoscopy, flexible; with removal of                         tumor(s), polyp(s), or other lesion(s) by snare                         technique Diagnosis Code(s):     --- Professional ---                        Z86.010, Personal history of  colonic polyps                        K63.5, Polyp of colon CPT copyright 2019 American Medical Association. All rights reserved. The codes documented in this report are preliminary and upon coder review may  be revised to meet current compliance requirements. [name] MD, MD 01/11/2022 11:17:20 AM This report has been signed electronically. Number of Addenda: 0 Note Initiated On: 01/11/2022 10:59 AM Scope Withdrawal Time: 0 hours 6 minutes 58 seconds  Total Procedure Duration: 0 hours 12 minutes 4 seconds  Estimated Blood Loss:  Estimated blood loss: none.      [facility]

## 2022-01-11 NOTE — Anesthesia Postprocedure Evaluation (Signed)
Anesthesia Post Note  Patient: Karen [name]  Procedure(s) Performed: COLONOSCOPY WITH PROPOFOL  Patient location during evaluation: Endoscopy Anesthesia Type: General Level of consciousness: awake and alert Pain management: pain level controlled Vital Signs Assessment: post-procedure vital signs reviewed and stable Respiratory status: spontaneous breathing, nonlabored ventilation, respiratory function stable and patient connected to nasal cannula oxygen Cardiovascular status: blood pressure returned to baseline and stable Postop Assessment: no apparent nausea or vomiting Anesthetic complications: no   No notable events documented.   Last Vitals:  Vitals:   01/11/22 1129 01/11/22 1139  BP: 90/74 96/70  Pulse: 65 (!) 58  Resp: 17 11  Temp:    SpO2: 98% 98%    Last Pain:  Vitals:   01/11/22 1139  TempSrc:   PainSc: 0-No pain                 Precious Haws Joren Rehm

## 2022-01-12 ENCOUNTER — Encounter: Payer: Self-pay | Admitting: Gastroenterology

## 2022-01-12 LAB — SURGICAL PATHOLOGY

## 2022-01-13 ENCOUNTER — Encounter: Payer: Self-pay | Admitting: Gastroenterology

## 2022-01-13 NOTE — Progress Notes (Signed)
[facility] MD/PA/NP OP Progress Note  01/17/2022 3:42 PM Karen [name]  MRN:  [id number]  Chief Complaint:  Chief Complaint  Patient presents with   Follow-up   Depression   HPI:  This is a follow-up appointment for depression and take.  She states that although she has been doing better since starting duloxetine, she believes she needs a higher dose.  She continues to feel fatigue, and tends to be sleepy during the day (she states that she has been on phentermine, haldol for many years, and denies feeling drowsy due to these medication).  She was able to enjoy listening to music since the last visit.  She also finds it helpful to have 5 cats. She thinks they give her endorphin, and she takes good care of them.  She has depressive symptoms as in PHQ-9.  Although she did have SI, she was able to talk with her mother, and it was subsided afterwards. She denies SI since then.  She was started on BuSpar by Dr. Caryn Section, which has been very helpful for anxiety.  She has not had any tics since being on Haldol. After having discussed the importance of consolidation of her mental health care to avoid any confusion, she reports a preference that Dr. Caryn Section to prescribe her medication to her.  She also wants to see a therapist.  Discussed recommendation as below.     Wt Readings from Last 3 Encounters:  01/11/22 126 lb 4.1 oz (57.3 kg)  12/01/21 120 lb (54.4 kg)  11/10/21 120 lb (54.4 kg)    Visit Diagnosis:    ICD-10-CM   1. MDD (major depressive disorder), recurrent episode, moderate (HCC)  F33.1     2. Tics of organic origin  G25.69       Past Psychiatric History: Please see initial evaluation for full details. I have reviewed the history. No updates at this time.     Past Medical History:  Past Medical History:  Diagnosis Date   Ehlers-Danlos syndrome    Fibromyalgia    Headache    Sleep apnea    Tics of organic origin    Transient alteration of awareness     Past Surgical History:   Procedure Laterality Date   COLONOSCOPY WITH PROPOFOL N/A 02/13/2018   Procedure: COLONOSCOPY WITH PROPOFOL;  Surgeon: [name], MD;  Location: [facility] ENDOSCOPY;  Service: Endoscopy;  Laterality: N/A;   COLONOSCOPY WITH PROPOFOL N/A 01/11/2022   Procedure: COLONOSCOPY WITH PROPOFOL;  Surgeon: [name], MD;  Location: [facility] ENDOSCOPY;  Service: Endoscopy;  Laterality: N/A;   DILATION AND CURETTAGE OF UTERUS  08/16/2007   ESOPHAGOGASTRODUODENOSCOPY (EGD) WITH PROPOFOL  02/13/2018   Procedure: ESOPHAGOGASTRODUODENOSCOPY (EGD) WITH PROPOFOL;  Surgeon: [name], MD;  Location: [place] ENDOSCOPY;  Service: Endoscopy;;   EYE SURGERY Left 08/15/1988   3 Surgeries on left eye to correct crossed eye   LAPAROSCOPY Left 08/15/2001   Fallopian Tube   MANDIBLE SURGERY  08/15/1996   OTHER SURGICAL HISTORY Right 08/15/1985   3 Surgeries to repair broken arm   OTHER SURGICAL HISTORY Left    3 surgeries on her left thigh as an infant   OTHER SURGICAL HISTORY  08/15/1996   Jaw surgery   Right arm surgery  08/15/1985   x 3 due to fracture   TOENAIL EXCISION Bilateral 06/13/2019   Procedure: BILATERAL SECOND TOE PARTIAL NAIL ABLATION;  Surgeon: Erle Crocker, MD;  Location: [place];  Service: Orthopedics;  Laterality: Bilateral;  SURGERY REQUEST  TIME 1 HOUR   TONSILLECTOMY  12/13/2002   TONSILLECTOMY      Family Psychiatric History: Please see initial evaluation for full details. I have reviewed the history. No updates at this time.     Family History:  Family History  Problem Relation Age of Onset   Depression Mother    Stroke Mother    Fibromyalgia Mother    Osteoarthritis Mother    Asthma Brother    Depression Brother    Migraines Brother    Asperger's syndrome Brother    Other Brother        Ehlers-Danlos Syndrome   Cancer Maternal Aunt    Asperger's syndrome Maternal Aunt    Breast cancer Maternal Aunt    Heart disease Paternal Aunt    Heart disease  Paternal Uncle    Cervical cancer Maternal Grandmother    Osteoporosis Maternal Grandmother        Died at 39   Osteoarthritis Maternal Grandmother    Colon cancer Maternal Grandfather    Asthma Maternal Grandfather    Asperger's syndrome Maternal Grandfather    Emphysema Maternal Grandfather        Died at 20   Prostate cancer Maternal Grandfather    Heart attack Paternal Grandfather        Died at 47   Asthma Paternal Grandfather    Tuberculosis Paternal Grandfather    Cancer Cousin    Asperger's syndrome Cousin    Asperger's syndrome Other    Heart Problems Paternal 49        Died at 65   Ehlers-Danlos syndrome Father    Ehlers-Danlos syndrome Brother     Social History:  Social History   Socioeconomic History   Marital status: Single    Spouse name: Not on file   Number of children: 0   Years of education: Not on file   Highest education level: Bachelor's degree (e.g., BA, AB, BS)  Occupational History   Not on file  Tobacco Use   Smoking status: Never   Smokeless tobacco: Never  Vaping Use   Vaping Use: Never used  Substance and Sexual Activity   Alcohol use: No    Alcohol/week: 0.0 standard drinks   Drug use: No   Sexual activity: Never  Other Topics Concern   Not on file  Social History Narrative   Karen [name] is 46.   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    She lives with her mother.    She enjoys reading, watching TV, writing, and painting.    Social Determinants of Health   Financial Resource Strain: Low Risk    Difficulty of Paying Living Expenses: Not very hard  Food Insecurity: No Food Insecurity   Worried About Charity fundraiser in the Last Year: Never true   Ran Out of Food in the Last Year: Never true  Transportation Needs: No Transportation Needs   Lack of Transportation (Medical): No   Lack of Transportation (Non-Medical): No  Physical Activity: Insufficiently Active   Days of Exercise per Week: 2 days   Minutes of Exercise per  Session: 30 min  Stress: No Stress Concern Present   Feeling of Stress : Not at all  Social Connections: Socially Isolated   Frequency of Communication with Friends and Family: Twice a week   Frequency of Social Gatherings with Friends and Family: Once a week   Attends Religious Services: Never   Marine scientist or Organizations: No   Attends [employer]  or Organization Meetings: Never   Marital Status: Never married    Allergies:  Allergies  Allergen Reactions   Augmentin  [Amoxicillin-Pot Clavulanate] Diarrhea   Chocolate Flavor     Other reaction(s): Other (See Comments) Wheezing, acne   Demerol [Meperidine] Other (See Comments)    Slow to wake up when this drug is given.    Keflex [Cephalexin] Nausea And Vomiting   Morphine And Related Nausea And Vomiting   Tape Dermatitis    Must use paper tape only   Toradol [Ketorolac Tromethamine] Nausea And Vomiting   Amoxicillin     Other reaction(s): Unknown   Chocolate     GI distress   Nsaids     patient develops ulcers Other reaction(s): Other (See Comments) patient develops ulcers   Strawberry Extract     GI distress    Metabolic Disorder Labs: No results found for: HGBA1C, MPG No results found for: PROLACTIN Lab Results  Component Value Date   CHOL 189 10/15/2019   TRIG 57 10/15/2019   HDL 70 10/15/2019   LDLCALC 108 (H) 10/15/2019   Lab Results  Component Value Date   TSH 1.057 10/05/2021   TSH 0.775 04/27/2021    Therapeutic Level Labs: No results found for: LITHIUM No results found for: VALPROATE No components found for:  CBMZ  Current Medications: Current Outpatient Medications  Medication Sig Dispense Refill   albuterol (VENTOLIN HFA) 108 (90 Base) MCG/ACT inhaler Inhale 2 puffs into the lungs every 6 (six) hours as needed. 18 g 5   celecoxib (CELEBREX) 200 MG capsule TAKE 1 CAPSULE BY MOUTH TWICE A DAY 60 capsule 5   DULoxetine (CYMBALTA) 60 MG capsule Take 1 capsule (60 mg total) by mouth daily.  Start after completing 30 mg daily for one week 30 capsule 1   fentaNYL (DURAGESIC) 75 MCG/HR Place 1 patch onto the skin every 3 (three) days. 10 patch 0   fexofenadine (ALLEGRA) 180 MG tablet Take 180 mg by mouth daily.     fluticasone (FLONASE) 50 MCG/ACT nasal spray      Fluticasone-Salmeterol (ADVAIR) 250-50 MCG/DOSE AEPB Inhale 1 puff into the lungs every 12 (twelve) hours. Rinse mouth after use 60 each 11   gabapentin (NEURONTIN) 800 MG tablet Take one in the morning and two at night     haloperidol (HALDOL) 0.5 MG tablet Take 0.5 tablets (0.25 mg total) by mouth 2 (two) times daily. 60 tablet 3   Na Sulfate-K Sulfate-Mg Sulf 17.5-3.13-1.6 GM/177ML SOLN See admin instructions.     naloxone (NARCAN) nasal spray 4 mg/0.1 mL Place 1 spray into the nose as needed.     omeprazole (PRILOSEC) 40 MG capsule Take 1 capsule (40 mg total) by mouth daily.     ondansetron (ZOFRAN-ODT) 4 MG disintegrating tablet DISSOLVE 1 TABLET IN MOUTH EVERY 8 HOURS FOR NAUSEA AND VOMITING 30 tablet 5   QUEtiapine (SEROQUEL) 25 MG tablet Take 25 mg by mouth at bedtime.     QUEtiapine (SEROQUEL) 25 MG tablet Take 1 tablet (25 mg total) by mouth at bedtime. 30 tablet 1   tiZANidine (ZANAFLEX) 4 MG tablet TAKE 0.5-1 TABLETS (2-4 MG TOTAL) BY MOUTH EVERY 8 (EIGHT) HOURS AS NEEDED FOR MUSCLE SPASMS. 270 tablet 1   trimethoprim-polymyxin b (POLYTRIM) ophthalmic solution Place 1 drop into both eyes every 6 (six) hours. 10 mL 0   No current facility-administered medications for this visit.     Musculoskeletal: Strength & Muscle Tone: within normal limits Gait & Station:  in a wheel chair Patient leans: N/A  Psychiatric Specialty Exam: Review of Systems  Psychiatric/Behavioral:  Positive for decreased concentration, dysphoric mood and sleep disturbance. Negative for agitation, behavioral problems, confusion, hallucinations, self-injury and suicidal ideas. The patient is nervous/anxious. The patient is not hyperactive.    All other systems reviewed and are negative.  Blood pressure 94/64, pulse 76, temperature 98.7 F (37.1 C), temperature source Temporal.There is no height or weight on file to calculate BMI.  General Appearance: Fairly Groomed  Eye Contact:  Good  Speech:  Clear and Coherent  Volume:  Normal  Mood:   better  Affect:  Appropriate, Congruent, and euthymic  Thought Process:  Coherent  Orientation:  Full (Time, Place, and Person)  Thought Content: Logical   Suicidal Thoughts:  No  Homicidal Thoughts:  No  Memory:  Immediate;   Good  Judgement:  Good  Insight:  Good  Psychomotor Activity:  Normal  Concentration:  Concentration: Good and Attention Span: Good  Recall:  Good  Fund of Knowledge: Good  Language: Good  Akathisia:  No  Handed:  Right  AIMS (if indicated): not done  Assets:  Communication Skills Desire for Improvement  ADL's:  Intact  Cognition: WNL  Sleep:   hypersomnia   Screenings: GAD-7    Flowsheet Row Office Visit from 11/25/2021 in [place]  Total GAD-7 Score 17      PHQ2-9    [place] Visit from 01/17/2022 in [place] Office Visit from 12/07/2021 in [place] from 12/01/2021 in [place] Visit from 11/25/2021 in [place] Office Visit from 11/05/2021 in [place]  PHQ-2 Total Score '3 6 2 4 2  '$ PHQ-9 Total Score '16 21 8 20 11      '$ [place] Office Visit from 01/17/2022 in [place] Admission (Discharged) from 01/11/2022 in [place] ED to Hosp-Admission (Discharged) from 10/04/2021 in [place] Error: Question 6 not populated No Risk No Risk        Assessment and Plan:  MATASHA SMIGELSKI is a 46 y.o. year old female with a history of depression with  psychotic features, catatonia, prior TBI x 3, seizure disorder, Ehlers-Danlos syndrome, fibromyalgia, chronic pain,  obstructive and central sleep apnea, who presents for follow up appointment for below.    1. MDD (major depressive disorder), recurrent episode, moderate (HCC) There has been overall improvement in depressive symptoms since cross tapering from duloxetine to the Lexapro. Psychosocial stressors includes her medical condition of pain secondary to Ehlers-Danlos syndrome, fibromyalgia, and TBI.  We discontinued Lexapro to avoid polypharmacy.  Will continue duloxetine to target depression.  Will continue BuSpar for anxiety given she reports good benefit from this medication.  Will continue quetiapine adjunctive treatment for depression.  She will greatly benefit from CBT; she was recommended to contact [facility]/contact insurance company for available options.   2. Tics of organic origin She denies any symptoms since starting Haldol.  She reports history of tic since TBI.  Will continue current dose to target Tic.  May consider switching to Abilify to consolidate antipsychotics/targeting depression in the future.   Plan Discontinue lexapro  Continue duloxetine 60 mg daily Continue quetiapine 25 mg at night Buspar 25 mg twice a day Continue Haldol 0.5 mg twice a day Next appointment: N/A.  She reports preference that her medication to be  taken over by Dr. Caryn Section.  Obtain release of information to talk with her mother, [name]     The patient demonstrates the following risk factors for suicide: Chronic risk factors for suicide include: psychiatric disorder of depression . Acute risk factors for suicide include: recent discharge from inpatient psychiatry. Protective factors for this patient include: positive social support and hope for the future. Considering these factors, the overall suicide risk at this point appears to be low. Patient is appropriate for outpatient follow up.          Collaboration of Care: Collaboration of Care: Other N/A  Patient/Guardian was advised Release of Information must be obtained prior to any record release in order to collaborate their care with an outside provider. Patient/Guardian was advised if they have not already done so to contact the registration department to sign all necessary forms in order for [place] to release information regarding their care.   Consent: Patient/Guardian gives verbal consent for treatment and assignment of benefits for services provided during this visit. Patient/Guardian expressed understanding and agreed to proceed.    Norman [place], MD 01/17/2022, 3:42 PM

## 2022-01-15 ENCOUNTER — Other Ambulatory Visit: Payer: Self-pay | Admitting: Psychiatry

## 2022-01-17 ENCOUNTER — Encounter: Payer: Self-pay | Admitting: Psychiatry

## 2022-01-17 ENCOUNTER — Ambulatory Visit (INDEPENDENT_AMBULATORY_CARE_PROVIDER_SITE_OTHER): Payer: Medicare Other | Admitting: Psychiatry

## 2022-01-17 VITALS — BP 94/64 | HR 76 | Temp 98.7°F

## 2022-01-17 DIAGNOSIS — G2569 Other tics of organic origin: Secondary | ICD-10-CM | POA: Diagnosis not present

## 2022-01-17 DIAGNOSIS — F331 Major depressive disorder, recurrent, moderate: Secondary | ICD-10-CM | POA: Diagnosis not present

## 2022-01-17 NOTE — Patient Instructions (Addendum)
Discontinue lexapro  Continue duloxetine 60 mg daily Continue quetiapine 25 mg at night Buspar 25 mg twice a day Continue Haldol 0.5 mg twice a day Next appointment: N/A. You will be followed by Dr. Caryn Section as you requested.

## 2022-01-27 ENCOUNTER — Other Ambulatory Visit: Payer: Self-pay | Admitting: Psychiatry

## 2022-01-31 ENCOUNTER — Other Ambulatory Visit: Payer: Self-pay | Admitting: Psychiatry

## 2022-02-02 ENCOUNTER — Ambulatory Visit (INDEPENDENT_AMBULATORY_CARE_PROVIDER_SITE_OTHER): Payer: Medicare Other | Admitting: Family

## 2022-02-02 ENCOUNTER — Other Ambulatory Visit: Payer: Self-pay | Admitting: Family Medicine

## 2022-02-02 ENCOUNTER — Encounter (INDEPENDENT_AMBULATORY_CARE_PROVIDER_SITE_OTHER): Payer: Self-pay | Admitting: Family

## 2022-02-02 VITALS — BP 80/60 | Wt 124.0 lb

## 2022-02-02 DIAGNOSIS — Q796 Ehlers-Danlos syndrome, unspecified: Secondary | ICD-10-CM

## 2022-02-02 DIAGNOSIS — M797 Fibromyalgia: Secondary | ICD-10-CM

## 2022-02-02 DIAGNOSIS — F333 Major depressive disorder, recurrent, severe with psychotic symptoms: Secondary | ICD-10-CM

## 2022-02-02 DIAGNOSIS — Z79899 Other long term (current) drug therapy: Secondary | ICD-10-CM | POA: Diagnosis not present

## 2022-02-02 DIAGNOSIS — G4739 Other sleep apnea: Secondary | ICD-10-CM | POA: Diagnosis not present

## 2022-02-02 DIAGNOSIS — G2569 Other tics of organic origin: Secondary | ICD-10-CM

## 2022-02-02 DIAGNOSIS — G8929 Other chronic pain: Secondary | ICD-10-CM

## 2022-02-02 DIAGNOSIS — G4710 Hypersomnia, unspecified: Secondary | ICD-10-CM | POA: Diagnosis not present

## 2022-02-02 NOTE — Telephone Encounter (Signed)
Medication Refill - Medication: fentaNYL ([place]) 75 MCG/HR   Has the patient contacted their pharmacy? Yes.   (Agent: If no, request that the patient contact the pharmacy for the refill. If patient does not wish to contact the pharmacy document the reason why and proceed with request.) (Agent: If yes, when and what did the pharmacy advise?)  Preferred Pharmacy (with phone number or street name):  [name] [place] [id number] Phone: [phone]: [phone]  Has the patient been seen for an appointment in the last year OR does the patient have an upcoming appointment? Yes.    Agent: Please be advised that RX refills may take up to 3 business days. We ask that you follow-up with your pharmacy.

## 2022-02-02 NOTE — Progress Notes (Unsigned)
Karen [name]   MRN:  [id number]  [birth year]   Provider: Rockwell Germany NP-C Location of Care: [facility]  Visit type: Return visit   Last visit: 08/04/2021  Referral source: Birdie Sons, MD  History from: Epic chart, patient and her mother  Brief history:  Copied from previous record: She has a chronic pain disorder related to Ehlers-Danlos syndrome with hypermobile joints.  She takes a variety of narcotic and nonnarcotic medications in order to control her pain.  These are prescribed by her primary physician, Dr. Caryn Section of [facility] family [facility].   In addition she has a chronic daily tension headache and has occasional migraines.  Gabapentin has been prescribed and has kept them in good control.   She is followed by pulmonologist Baird Lyons for [facility] because of obstructive and central apnea.  She has a BiPAP machine. She has a delayed circadian rhythm going to bed between 2 and 3 in the morning and sleeping until noon time.   She has excessive daytime sleepiness and problems with focus.  This has been treated with Adderall by her pulmonologist.   As an adult she developed a motor tic disorder.  Currently this involves jerking movements of her legs and elevation of her arms to the elbows toward her shoulders.  She has some vocalizations that sound like a mixture of a sign of moaned that she has had for a number of years. She believes that when she takes excessive amounts of caffeine that that makes her tics worse.     She has bilateral eyelid ptosis.  On occasion particularly later in the day she has closure of her left eyelid and has difficulty opening it. She has nearsightedness.   In addition to these problems, Karen [name] also has a significant problem with mood. She has had recent admissions to the hospital for psychosis, depression and catatonia. Her mother notes that she is paranoid and wonders if she has schizophrenia.   Today's  concerns:  *** has been otherwise generally healthy since she was last seen. Neither *** nor mother have other health concerns for *** today other than previously mentioned.   Review of systems: Please see HPI for neurologic and other pertinent review of systems. Otherwise all other systems were reviewed and were negative.  Problem List: Patient Active Problem List   Diagnosis Date Noted   History of colonic polyps    Polyp of descending colon    History of closed head injury 11/09/2021   Urinary tract infection without hematuria    Thyroid nodule 10/05/2021   Catatonia 10/05/2021   Depression, major, recurrent, severe with psychosis ([name]) 10/05/2021   Polypharmacy 08/12/2021   Major depressive disorder, recurrent episode, moderate ([place]) 05/01/2021   Asthma 04/22/2021   Paranoia (psychosis) ([place]) 04/22/2021   Psychosis ([place]) 04/22/2021   Urinary retention 04/22/2021   Chronic tension type headache 06/04/2020   Fibrocystic breast changes, bilateral 10/09/2019   Family history of colon cancer    Rectal polyp    Heartburn    Excessive somnolence disorder 06/28/2017   Difficulty hearing 08/31/2015   Back pain, chronic 08/31/2015   Rheumatoid arthritis ([place]) 08/31/2015   Alopecia 03/10/2015   Arthritis 03/10/2015   Allergic asthma, mild persistent, uncomplicated 76/81/1572   Chronic nausea 03/10/2015   DDD (degenerative disc disease), lumbar 03/10/2015   Depression with anxiety 03/10/2015   Personal history of MRSA (methicillin resistant Staphylococcus aureus) 03/10/2015   Insomnia 03/10/2015   Mitral valve prolapse 03/10/2015  Paresthesias/numbness 03/10/2015   Allergic rhinitis, seasonal 03/10/2015   Seizure disorder ([place]) 03/10/2015   Mixed sleep apnea, Central predominant 03/10/2015   Migraine without aura 01/27/2014   Fibromyalgia 01/27/2014   Tics of organic origin 01/07/2014   Ehlers-Danlos syndrome 01/07/2014     Past Medical History:  Diagnosis Date    Ehlers-Danlos syndrome    Fibromyalgia    Headache    Sleep apnea    Tics of organic origin    Transient alteration of awareness     Past medical history comments: See HPI Copied from previous record: She had 2 head injuries as an adolescent, one of which resulted in a post concussion condition that kept her out of school for a year.   She has a chronic pain syndrome from her Ehlers-Danlos and has been on a variety of medications including Celebrex, tizanidine, Lidoderm patches, fentanyl patches, hydrocodone, Lyrica, gabapentin, and Cymbalta.   She is also had problems with sinusitis, iron deficiency, and chronic lumbar pain.  Other medical problems include osteoarthritis, fibromyalgia, asthma.  She had seizure-like events in the past that were nonepileptic proven by EEGs capturing the behavior without electrographic seizure activity.   Behavior History Anxiety and depression  Surgical history: Past Surgical History:  Procedure Laterality Date   COLONOSCOPY WITH PROPOFOL N/A 02/13/2018   Procedure: COLONOSCOPY WITH PROPOFOL;  Surgeon: [name], MD;  Location: [facility] ENDOSCOPY;  Service: Endoscopy;  Laterality: N/A;   COLONOSCOPY WITH PROPOFOL N/A 01/11/2022   Procedure: COLONOSCOPY WITH PROPOFOL;  Surgeon: [name], MD;  Location: [facility] ENDOSCOPY;  Service: Endoscopy;  Laterality: N/A;   DILATION AND CURETTAGE OF UTERUS  08/16/2007   ESOPHAGOGASTRODUODENOSCOPY (EGD) WITH PROPOFOL  02/13/2018   Procedure: ESOPHAGOGASTRODUODENOSCOPY (EGD) WITH PROPOFOL;  Surgeon: [name], MD;  Location: [place] ENDOSCOPY;  Service: Endoscopy;;   EYE SURGERY Left 08/15/1988   3 Surgeries on left eye to correct crossed eye   LAPAROSCOPY Left 08/15/2001   Fallopian Tube   MANDIBLE SURGERY  08/15/1996   OTHER SURGICAL HISTORY Right 08/15/1985   3 Surgeries to repair broken arm   OTHER SURGICAL HISTORY Left    3 surgeries on her left thigh as an infant   OTHER SURGICAL HISTORY  08/15/1996   Jaw  surgery   Right arm surgery  08/15/1985   x 3 due to fracture   TOENAIL EXCISION Bilateral 06/13/2019   Procedure: BILATERAL SECOND TOE PARTIAL NAIL ABLATION;  Surgeon: Erle Crocker, MD;  Location: [place];  Service: Orthopedics;  Laterality: Bilateral;  SURGERY REQUEST TIME 1 HOUR   TONSILLECTOMY  12/13/2002   TONSILLECTOMY       Family history: family history includes Asperger's syndrome in her brother, cousin, maternal aunt, maternal grandfather, and another family member; Asthma in her brother, maternal grandfather, and paternal grandfather; Breast cancer in her maternal aunt; Cancer in her cousin and maternal aunt; Cervical cancer in her maternal grandmother; Colon cancer in her maternal grandfather; Depression in her brother and mother; Ehlers-Danlos syndrome in her brother and father; Emphysema in her maternal grandfather; Fibromyalgia in her mother; Heart Problems in her paternal grandmother; Heart attack in her paternal grandfather; Heart disease in her paternal aunt and paternal uncle; Migraines in her brother; Osteoarthritis in her maternal grandmother and mother; Osteoporosis in her maternal grandmother; Other in her brother; Prostate cancer in her maternal grandfather; Stroke in her mother; Tuberculosis in her paternal grandfather.   Social history: Social History   Socioeconomic History   Marital status: Single  Spouse name: Not on file   Number of children: 0   Years of education: Not on file   Highest education level: Bachelor's degree (e.g., BA, AB, BS)  Occupational History   Not on file  Tobacco Use   Smoking status: Never   Smokeless tobacco: Never  Vaping Use   Vaping Use: Never used  Substance and Sexual Activity   Alcohol use: No    Alcohol/week: 0.0 standard drinks of alcohol   Drug use: No   Sexual activity: Never  Other Topics Concern   Not on file  Social History Narrative   Arminda is 81.   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     She lives with her mother.    She enjoys reading, watching TV, writing, and painting.    Social Determinants of Health   Financial Resource Strain: Low Risk  (12/01/2021)   Overall Financial Resource Strain (CARDIA)    Difficulty of Paying Living Expenses: Not very hard  Food Insecurity: No Food Insecurity (12/01/2021)   Hunger Vital Sign    Worried About Running Out of Food in the Last Year: Never true    Ran Out of Food in the Last Year: Never true  Transportation Needs: No Transportation Needs (12/01/2021)   PRAPARE - Hydrologist (Medical): No    Lack of Transportation (Non-Medical): No  Physical Activity: Insufficiently Active (12/01/2021)   Exercise Vital Sign    Days of Exercise per Week: 2 days    Minutes of Exercise per Session: 30 min  Stress: No Stress Concern Present (12/01/2021)   [place]    Feeling of Stress : Not at all  Social Connections: Socially Isolated (12/01/2021)   Social Connection and Isolation Panel [NHANES]    Frequency of Communication with Friends and Family: Twice a week    Frequency of Social Gatherings with Friends and Family: Once a week    Attends Religious Services: Never    Marine scientist or Organizations: No    Attends Archivist Meetings: Never    Marital Status: Never married  Intimate Partner Violence: Not At Risk (12/01/2021)   Humiliation, Afraid, Rape, and Kick questionnaire    Fear of Current or Ex-Partner: No    Emotionally Abused: No    Physically Abused: No    Sexually Abused: No      Past/failed meds: Copied from previous record:  Allergies: Allergies  Allergen Reactions   Augmentin  [Amoxicillin-Pot Clavulanate] Diarrhea   Chocolate Flavor     Other reaction(s): Other (See Comments) Wheezing, acne   Demerol [Meperidine] Other (See Comments)    Slow to wake up when this drug is given.    Keflex [Cephalexin]  Nausea And Vomiting   Morphine And Related Nausea And Vomiting   Tape Dermatitis    Must use paper tape only   Toradol [Ketorolac Tromethamine] Nausea And Vomiting   Amoxicillin     Other reaction(s): Unknown   Chocolate     GI distress   Nsaids     patient develops ulcers Other reaction(s): Other (See Comments) patient develops ulcers   Strawberry Extract     GI distress      Immunizations: Immunization History  Administered Date(s) Administered   H1N1 06/03/2008   HPV 9-valent 07/19/2018   Influenza Split 06/14/2012   Influenza,inj,Quad PF,6+ Mos 10/01/2018, 05/26/2020, 07/19/2021   Influenza-Unspecified 05/24/2017, 05/16/2019   Moderna Covid-19 Vaccine Bivalent  Booster 19yr & up 07/19/2021   PFIZER(Purple Top)SARS-COV-2 Vaccination 11/05/2019, 11/26/2019, 05/26/2020   Pneumococcal Polysaccharide-23 05/24/2017   Tdap 06/14/2012      Diagnostics/Screenings: Copied from previous record:   Physical Exam: There were no vitals taken for this visit.  General: Well developed, well nourished, seated, in no evident distress Head: Head normocephalic and atraumatic.  Oropharynx benign. Neck: Supple Cardiovascular: Regular rate and rhythm, no murmurs Respiratory: Breath sounds clear to auscultation Musculoskeletal: No obvious deformities or scoliosis Skin: No rashes or neurocutaneous lesions  Neurologic Exam Mental Status: Awake and fully alert.  Oriented to place and time.  Recent and remote memory intact.  Attention span, concentration, and fund of knowledge appropriate.  Mood and affect appropriate. Cranial Nerves: Fundoscopic exam reveals sharp disc margins.  Pupils equal, briskly reactive to light.  Extraocular movements full without nystagmus. Hearing intact and symmetric to whisper.  Facial sensation intact.  Face tongue, palate move normally and symmetrically. Shoulder shrug normal Motor: Normal bulk and tone. Normal strength in all tested extremity muscles. Sensory:  Intact to touch and temperature in all extremities.  Coordination: Rapid alternating movements normal in all extremities.  Finger-to-nose and heel-to shin performed accurately bilaterally.  Romberg negative. Gait and Station: Arises from chair without difficulty.  Stance is normal. Gait demonstrates normal stride length and balance.   Able to heel, toe and tandem walk without difficulty. Reflexes: 1+ and symmetric. Toes downgoing.   Impression: No diagnosis found.    Recommendations for plan of care: The patient's previous Epic records were reviewed. VSavonhas neither had nor required imaging or lab studies since the last visit.   The medication list was reviewed and reconciled. No changes were made in the prescribed medications today. A complete medication list was provided to the patient.  No orders of the defined types were placed in this encounter.   No follow-ups on file.   Allergies as of 02/02/2022       Reactions   Augmentin  [amoxicillin-pot Clavulanate] Diarrhea   Chocolate Flavor    Other reaction(s): Other (See Comments) Wheezing, acne   Demerol [meperidine] Other (See Comments)   Slow to wake up when this drug is given.    Keflex [cephalexin] Nausea And Vomiting   Morphine And Related Nausea And Vomiting   Tape Dermatitis   Must use paper tape only   Toradol [ketorolac Tromethamine] Nausea And Vomiting   Amoxicillin    Other reaction(s): Unknown   Chocolate    GI distress   Nsaids    patient develops ulcers Other reaction(s): Other (See Comments) patient develops ulcers   Strawberry Extract    GI distress        Medication List        Accurate as of February 02, 2022  7:55 AM. If you have any questions, ask your nurse or doctor.          albuterol 108 (90 Base) MCG/ACT inhaler Commonly known as: Ventolin HFA Inhale 2 puffs into the lungs every 6 (six) hours as needed.   celecoxib 200 MG capsule Commonly known as: CELEBREX TAKE 1 CAPSULE BY  MOUTH TWICE A DAY   DULoxetine 60 MG capsule Commonly known as: CYMBALTA Take 1 capsule (60 mg total) by mouth daily. Start after completing 30 mg daily for one week   fentaNYL 75 MCG/HR Commonly known as: DBoyds1 patch onto the skin every 3 (three) days.   fexofenadine 180 MG tablet Commonly known as: ALLEGRA Take 180  mg by mouth daily.   fluticasone 50 MCG/ACT nasal spray Commonly known as: FLONASE   Fluticasone-Salmeterol 250-50 MCG/DOSE Aepb Commonly known as: ADVAIR Inhale 1 puff into the lungs every 12 (twelve) hours. Rinse mouth after use   gabapentin 800 MG tablet Commonly known as: NEURONTIN Take one in the morning and two at night   haloperidol 0.5 MG tablet Commonly known as: HALDOL Take 0.5 tablets (0.25 mg total) by mouth 2 (two) times daily.   Na Sulfate-K Sulfate-Mg Sulf 17.5-3.13-1.6 GM/177ML Soln See admin instructions.   naloxone 4 MG/0.1ML Liqd nasal spray kit Commonly known as: NARCAN Place 1 spray into the nose as needed.   omeprazole 40 MG capsule Commonly known as: PRILOSEC Take 1 capsule (40 mg total) by mouth daily.   ondansetron 4 MG disintegrating tablet Commonly known as: ZOFRAN-ODT DISSOLVE 1 TABLET IN MOUTH EVERY 8 HOURS FOR NAUSEA AND VOMITING   QUEtiapine 25 MG tablet Commonly known as: SEROQUEL Take 25 mg by mouth at bedtime.   QUEtiapine 25 MG tablet Commonly known as: SEROQUEL Take 1 tablet (25 mg total) by mouth at bedtime.   tiZANidine 4 MG tablet Commonly known as: ZANAFLEX TAKE 0.5-1 TABLETS (2-4 MG TOTAL) BY MOUTH EVERY 8 (EIGHT) HOURS AS NEEDED FOR MUSCLE SPASMS.   trimethoprim-polymyxin b ophthalmic solution Commonly known as: POLYTRIM Place 1 drop into both eyes every 6 (six) hours.      Total time spent with the patient was *** minutes, of which 50% or more was spent in counseling and coordination of care.  Rockwell Germany NP-C [facility] Ph. [phone] Fax  [phone]

## 2022-02-02 NOTE — Patient Instructions (Signed)
It was a pleasure to see you today!  Instructions for you until your next appointment are as follows: Continue taking your medications as prescribed Restart bipap for sleep as prescribed. Getting better sleep should help you to be less tired during the day Work on being more active during the day as we discussed Contact your PCP about a referral to an adult care psychiatrist Please sign up for MyChart if you have not done so. Please plan to return for follow up in 6 months or sooner if needed.  Feel free to contact our office during normal business hours at [phone] with questions or concerns. If there is no answer or the call is outside business hours, please leave a message and our clinic staff will call you back within the next business day.  If you have an urgent concern, please stay on the line for our after-hours answering service and ask for the on-call neurologist.     I also encourage you to use MyChart to communicate with me more directly. If you have not yet signed up for MyChart within [facility], the front desk staff can help you. However, please note that this inbox is NOT monitored on nights or weekends, and response can take up to 2 business days.  Urgent matters should be discussed with the on-call pediatric neurologist.   At [facility], we are committed to providing exceptional care. You will receive a patient satisfaction survey through text or email regarding your visit today. Your opinion is important to me. Comments are appreciated.

## 2022-02-02 NOTE — Telephone Encounter (Signed)
Requested medication (s) are due for refill today: yes  Requested medication (s) are on the active medication list: yes  Last refill:  12/29/21 310 with 0 RF  Future visit scheduled: 05/10/22, seen 01/07/22  Notes to clinic:  This medication can not be delegated, please assess.        Requested Prescriptions  Pending Prescriptions Disp Refills   fentaNYL (DURAGESIC) 75 MCG/HR 10 patch 0    Sig: Place 1 patch onto the skin every 3 (three) days.     Not Delegated - Analgesics:  Opioid Agonists Failed - 02/02/2022 11:19 AM      Failed - This refill cannot be delegated      Passed - Urine Drug Screen completed in last 360 days      Passed - Valid encounter within last 3 months    Recent Outpatient Visits           3 weeks ago Family history of breast cancer   [facility] Birdie Sons, MD   1 month ago DDD (degenerative disc disease), lumbar   [facility] Birdie Sons, MD   2 months ago Conjunctivitis of both eyes, unspecified conjunctivitis type   [facility] [name], PA-C   2 months ago [place], Donald E, MD   4 months ago Delusional disorder [facility])   [facility] Birdie Sons, MD       Future Appointments             In 3 months Fisher, Kirstie Peri, MD [facility] [place]   In 4 months Ralene Bathe, MD [place]   In 5 months [place], [place]

## 2022-02-03 MED ORDER — FENTANYL 75 MCG/HR TD PT72: 1.0000 | MEDICATED_PATCH | TRANSDERMAL | 0 refills | Status: DC

## 2022-02-04 ENCOUNTER — Other Ambulatory Visit: Payer: Self-pay | Admitting: Family Medicine

## 2022-02-04 ENCOUNTER — Telehealth (INDEPENDENT_AMBULATORY_CARE_PROVIDER_SITE_OTHER): Payer: Self-pay | Admitting: Family

## 2022-02-04 DIAGNOSIS — G8929 Other chronic pain: Secondary | ICD-10-CM

## 2022-02-06 ENCOUNTER — Encounter (INDEPENDENT_AMBULATORY_CARE_PROVIDER_SITE_OTHER): Payer: Self-pay | Admitting: Family

## 2022-02-06 MED ORDER — FENTANYL 75 MCG/HR TD PT72: 1.0000 | MEDICATED_PATCH | TRANSDERMAL | 0 refills | Status: DC

## 2022-02-10 ENCOUNTER — Other Ambulatory Visit: Payer: Self-pay | Admitting: Family Medicine

## 2022-02-10 NOTE — Telephone Encounter (Signed)
Karen Clay, MD Karen Clay, MD   Outpatient Medication Detail The following meds are being requested by pt for Dr Caryn Section as her psychiatrist has dropped her. She is very upset and wants Dr Caryn Section to double the dose of her Cymbalta. Pls reach out to pt at [phone]. She states she is out and can not sleep.     Disp Refills Start End   DULoxetine (CYMBALTA) 60 MG capsule 30 capsule 1 12/02/2021 01/31/2022   Sig - Route: Take 1 capsule (60 mg total) by mouth daily. Start after completing 30 mg daily for one week - Oral   Sent to pharmacy as: DULoxetine (CYMBALTA) 60 MG capsule   E-Prescribing Status: Receipt confirmed by pharmacy (11/25/2021  9:57 AM EDT    Karen Clay, MD Karen Clay, MD   Outpatient Medication Detail   Disp Refills Start End   QUEtiapine (SEROQUEL) 25 MG tablet 30 tablet 1 11/25/2021 01/24/2022   Sig - Route: Take 1 tablet (25 mg total) by mouth at bedtime. - Oral   Sent to pharmacy as: QUEtiapine (SEROQUEL) 25 MG tablet   E-Prescribing Status: Receipt confirmed by pharmacy (11/25/2021  9:57 AM EDT)   Renewals

## 2022-02-10 NOTE — Telephone Encounter (Signed)
[facility] for more details. Okay for [facility] triage to inquire.

## 2022-02-11 ENCOUNTER — Other Ambulatory Visit: Payer: Self-pay | Admitting: Physician Assistant

## 2022-02-11 MED ORDER — QUETIAPINE FUMARATE 25 MG PO TABS
25.0000 mg | ORAL_TABLET | Freq: Every day | ORAL | 0 refills | Status: DC
Start: 2022-02-11 — End: 2022-03-07

## 2022-02-11 MED ORDER — DULOXETINE HCL 60 MG PO CPEP: 60.0000 mg | ORAL_CAPSULE | Freq: Every day | ORAL | 0 refills | Status: DC

## 2022-02-11 NOTE — Telephone Encounter (Signed)
Refill request was denied by covering provider here in our office. Per Karen Kirschner, PA-C refills need to come from psychiatry. I called and advised Karen [name]. She replied "I guess I need to get ready to take her to the hospital when she becomes psychotic". I advised Karen [name] that she should  contact psychiatry and explain the situation of patient's need for refills, and explain that she hasn't asked Karen [name] if he would be willing to take over managing patient's mental health medications. Karen [name] replied "Karen [name] doesn't have a psychiatrist. She's not going back to that doctor because she wanted to prescribe all of her medications and take Buspar away". I restated to Karen [name] that she needs to contact the psychiatry office that last seen patient on 01/17/2022, and explain this situation and request refills. Karen [name] verbalized that she was not happy about this. She requested that I document how she felt. She states "I don't want social services to come back into my house because of this saying that I'm not providing appropriate care for my daughter. Make sure you document what I said!" . Karen [name] would not agree to contact psychiatry. She kept saying that patient doesn't have a psychiatrist. She requested that I send this message to Karen [name] to review when he returns to the office next week. She also mentioned that she called earlier this week requesting refills on these medications, but there are no refill encounters for these particular medications.

## 2022-02-11 NOTE — Progress Notes (Signed)
See telephone encounter for more information These are chronic meds once rx by her psychiatrist. She has fired her psychiatrist and wants dr Risk manager to take over rx. Pt was already instructed to return to her psychiatrist for these refills, which was refused.  As Dr Caryn Section is out and to avoid lapse in medications and a mental health crisis, will send in temporary refills of duloxetine and seroquel with the requirement of seeing dr Caryn Section in the next month or returning to her psychiatrist for treatment  This was communicated with staff who have been in contact w/ pt and pt's mother as well, again please see telephone encounter.

## 2022-02-11 NOTE — Telephone Encounter (Signed)
Patient's mom Wynona Canes advised and verbalized understanding. Follow up appointment scheduled with Dr. Caryn Section on 03/02/2022 at 11am.

## 2022-02-11 NOTE — Telephone Encounter (Signed)
I don't see any documentation where Dr. Caryn Section agreed to manage psychiatry medications. Per last ov from  12/07/2021 Dr. Caryn Section noted: "Depression with anxiety Started back on duloxetine and titrated to 60 by psychiatry. May see some improvement with anxiety with increase dose of gabapentin as discussed above. Follow up with psychiatry in June as scheduled."  I called and spoke with patient's mom Wynona Canes and advised her of this. Wynona Canes states that patient's psychiatrist fired her because they wouldn't let her prescribe all of her medications. Wynona Canes stated that Keeli didn't want her psychiatrist managing all her medications, and they felt that it was inappropriate. I reviewed the psychiatry office note from 01/17/2022. It notes that psychiatrist Dr.Hisada discussed consolidating mental health care to prevent confusion. It notes that patient declined and preferred that Dr. Caryn Section prescribe her medications.   Please advise on refill request. Wynona Canes states patient is completely out of Seroquel and needs a refill on Cymbalta also (she would like Cymbalta increased to 2 tablets daily). Wynona Canes states that she needs these refilled today so that patient doesn't have another psychotic episode as she did a few months ago.

## 2022-02-11 NOTE — Telephone Encounter (Signed)
Patient called and her mother, on DPR, answered the phone and says the patient is asleep. She wanted to know the reason for the call. I advised I'm calling to follow up on the refill requests for Cymbalta and Seroquel and the dismissal from the psychiatrist. She says the Cymbalta needs increasing to 2 tabs daily as it was when Dr. Caryn Section started her back on the medications and she is out of Seroquel, so will need her refills today so that she will not be without her antipsychotics. I advised I will send this to Dr. Caryn Section, she says he is out of town, so how will he get it. Advised it will be routed to his basket and the provider who is covering will review and if there are any additional questions/recommendations, someone will call her back. Mom verbalized understanding.

## 2022-02-28 ENCOUNTER — Ambulatory Visit: Payer: Self-pay | Admitting: Urology

## 2022-03-02 ENCOUNTER — Ambulatory Visit: Payer: Medicare Other | Admitting: Family Medicine

## 2022-03-06 ENCOUNTER — Other Ambulatory Visit: Payer: Self-pay | Admitting: Physician Assistant

## 2022-03-07 ENCOUNTER — Other Ambulatory Visit: Payer: Self-pay | Admitting: Family Medicine

## 2022-03-07 DIAGNOSIS — G8929 Other chronic pain: Secondary | ICD-10-CM

## 2022-03-07 NOTE — Telephone Encounter (Signed)
Medication Refill - Medication: fentaNYL (DURAGESIC) 75 MCG/HR. (Patient is out of patches and would like request expedited)   Has the patient contacted their pharmacy? No. Patient states she has been advised by PCP and pharmacy to call the office to request script.   Preferred Pharmacy (with phone number or street name):   [name]  [phone] Fax:  [phone]     Has the patient been seen for an appointment in the last year OR does the patient have an upcoming appointment? Yes.    Agent: Please be advised that RX refills may take up to 3 business days. We ask that you follow-up with your pharmacy.

## 2022-03-08 ENCOUNTER — Other Ambulatory Visit: Payer: Self-pay | Admitting: Family Medicine

## 2022-03-08 MED ORDER — FENTANYL 75 MCG/HR TD PT72: 1.0000 | MEDICATED_PATCH | TRANSDERMAL | 0 refills | Status: DC

## 2022-03-08 NOTE — Telephone Encounter (Signed)
Requested medication (s) are due for refill today - yes  Requested medication (s) are on the active medication list -yes  Future visit scheduled -yes  Last refill: 02/06/22 #10  Notes to clinic: non delegated Rx  Requested Prescriptions  Pending Prescriptions Disp Refills   fentaNYL (DURAGESIC) 75 MCG/HR 10 patch 0    Sig: Place 1 patch onto the skin every 3 (three) days.     Not Delegated - Analgesics:  Opioid Agonists Failed - 03/07/2022 11:17 AM      Failed - This refill cannot be delegated      Passed - Urine Drug Screen completed in last 360 days      Passed - Valid encounter within last 3 months    Recent Outpatient Visits           2 months ago Family history of breast cancer   [facility] Birdie Sons, MD   3 months ago DDD (degenerative disc disease), lumbar   [facility] Birdie Sons, MD   3 months ago Conjunctivitis of both eyes, unspecified conjunctivitis type   [facility] Thedore Mins, [place], PA-C   4 months ago [place], Donald E, MD   5 months ago Delusional disorder [facility])   [facility] Birdie Sons, MD       Future Appointments             In 1 week Fisher, Kirstie Peri, MD [facility], [facility]   In 2 months Caryn Section, Kirstie Peri, MD [facility], [place]   In 3 months Ralene Bathe, MD [place]   In 4 months McGowan, Gordan Payment [facility]               Requested Prescriptions  Pending Prescriptions Disp Refills   fentaNYL (DURAGESIC) 75 MCG/HR 10 patch 0    Sig: Place 1 patch onto the skin every 3 (three) days.     Not Delegated - Analgesics:  Opioid Agonists Failed - 03/07/2022 11:17 AM      Failed - This refill cannot be delegated      Passed - Urine Drug Screen completed in last 360 days      Passed - Valid encounter within last 3 months    Recent Outpatient Visits            2 months ago Family history of breast cancer   [facility] Birdie Sons, MD   3 months ago DDD (degenerative disc disease), lumbar   [facility] Birdie Sons, MD   3 months ago Conjunctivitis of both eyes, unspecified conjunctivitis type   [facility] Thedore Mins, [place], PA-C   4 months ago [place], Donald E, MD   5 months ago Delusional disorder [facility])   [facility] Birdie Sons, MD       Future Appointments             In 1 week Fisher, Kirstie Peri, MD [facility], [place]   In 2 months Caryn Section, Kirstie Peri, MD [facility], [place]   In 3 months Ralene Bathe, MD [place]   In 4 months [place], [place]

## 2022-03-10 ENCOUNTER — Other Ambulatory Visit: Payer: Self-pay | Admitting: Family Medicine

## 2022-03-16 ENCOUNTER — Other Ambulatory Visit: Payer: Self-pay | Admitting: Gastroenterology

## 2022-03-16 ENCOUNTER — Telehealth: Payer: Self-pay | Admitting: Family Medicine

## 2022-03-16 DIAGNOSIS — R12 Heartburn: Secondary | ICD-10-CM

## 2022-03-16 NOTE — Telephone Encounter (Signed)
gabapentin (NEURONTIN) 800 MG tablet 60 tablet 3 02/03/2022    Sig: TAKE 1 TABLET BY MOUTH 2 TIMES DAILY.   Sent to pharmacy as: gabapentin (NEURONTIN) 800 MG tablet   E-Prescribing Status: Receipt confirmed by pharmacy (02/03/2022  7:39 AM EDT   Pt states that she and Dr Caryn Section had a conversation re this script and the times daily were moved up to 3. She has used this script and there is (1) left for pu.She is concerned as she states will soon be out as for the length of this original script she has been taking 3 per day and wants a new script sent in for 3 per day vs 2. FU # is [phone]. Pt has anxiety over this situation. [name] [id number] Phone: [phone]: [phone] Hours: Not open 24 hours

## 2022-03-17 MED ORDER — GABAPENTIN 800 MG PO TABS: ORAL_TABLET | ORAL | 3 refills | Status: DC

## 2022-03-21 ENCOUNTER — Ambulatory Visit (INDEPENDENT_AMBULATORY_CARE_PROVIDER_SITE_OTHER): Payer: Medicare Other | Admitting: Family Medicine

## 2022-03-21 ENCOUNTER — Encounter: Payer: Self-pay | Admitting: Family Medicine

## 2022-03-21 ENCOUNTER — Other Ambulatory Visit: Payer: Self-pay | Admitting: Family Medicine

## 2022-03-21 VITALS — BP 109/86 | HR 90 | Temp 98.5°F | Resp 16

## 2022-03-21 DIAGNOSIS — F331 Major depressive disorder, recurrent, moderate: Secondary | ICD-10-CM | POA: Diagnosis not present

## 2022-03-21 DIAGNOSIS — R300 Dysuria: Secondary | ICD-10-CM | POA: Diagnosis not present

## 2022-03-21 DIAGNOSIS — Z1231 Encounter for screening mammogram for malignant neoplasm of breast: Secondary | ICD-10-CM

## 2022-03-21 DIAGNOSIS — N3001 Acute cystitis with hematuria: Secondary | ICD-10-CM | POA: Diagnosis not present

## 2022-03-21 DIAGNOSIS — F418 Other specified anxiety disorders: Secondary | ICD-10-CM

## 2022-03-21 DIAGNOSIS — M79674 Pain in right toe(s): Secondary | ICD-10-CM

## 2022-03-21 DIAGNOSIS — Q796 Ehlers-Danlos syndrome, unspecified: Secondary | ICD-10-CM

## 2022-03-21 DIAGNOSIS — Z8659 Personal history of other mental and behavioral disorders: Secondary | ICD-10-CM

## 2022-03-21 DIAGNOSIS — M79675 Pain in left toe(s): Secondary | ICD-10-CM | POA: Diagnosis not present

## 2022-03-21 LAB — POCT URINALYSIS DIPSTICK
Bilirubin, UA: NEGATIVE
Glucose, UA: NEGATIVE
Ketones, UA: NEGATIVE
Nitrite, UA: POSITIVE
Odor: NORMAL
Protein, UA: NEGATIVE
Spec Grav, UA: <=1.005 — AB (ref 1.010–1.025)
Urobilinogen, UA: 1 E.U./dL
pH, UA: 5 (ref 5.0–8.0)

## 2022-03-21 MED ORDER — DULOXETINE HCL 60 MG PO CPEP
120.0000 mg | ORAL_CAPSULE | Freq: Every day | ORAL | 3 refills | Status: DC
Start: 2022-03-21 — End: 2022-04-12

## 2022-03-21 MED ORDER — SULFAMETHOXAZOLE-TRIMETHOPRIM 800-160 MG PO TABS: 1.0000 | ORAL_TABLET | Freq: Two times a day (BID) | ORAL | 0 refills | Status: AC

## 2022-03-21 MED ORDER — DICLOFENAC SODIUM 1 % EX GEL: CUTANEOUS | 3 refills | Status: DC

## 2022-03-21 NOTE — Patient Instructions (Signed)
Please review the attached list of medications and notify my office if there are any errors.  ? ?Please call the [facility] ([phone]) to schedule a routine screening mammogram. ? ?

## 2022-03-21 NOTE — Progress Notes (Signed)
I,Karen Karen [name],acting as a scribe for Lelon Huh, MD.,have documented Karen [name] relevant documentation on the behalf of Lelon Huh, MD,as directed by  Lelon Huh, MD while in the presence of Lelon Huh, MD.   Established patient visit   Patient: Karen Karen [name]   DOB: 07-30-1976   46 y.o. Female  MRN: [id number] Visit Date: 03/21/2022  Today's healthcare provider: Lelon Huh, MD   Chief Complaint  Patient presents with   Medication Management    Medication management    Subjective     Patient presents requesting increase dosage of Cymbalta 60 mg twice daily.    Pharmacy dispensed Gabapentin as twice daily instead of three times daily.    Complains of painful urination, cloudy and difficult to start stream.  Onset 1 wk.    Reports she needs Haloperidol increased and wonders if she needs a referral.    Needs refill for Voltaren.    Patient's mother is concerned of patient talking to herself "in my brain and conscience to talking back to me."  Wonders if she should see Dr. Lucia Karen [name] / podiatrist due to possible broken toes.  States they Karen [name] hurt.  Accident with wheelchair.    Medications: Outpatient Medications Prior to Visit  Medication Sig Note   albuterol (VENTOLIN HFA) 108 (90 Base) MCG/ACT inhaler Inhale 2 puffs into the lungs every 6 (six) hours as needed.    celecoxib (CELEBREX) 200 MG capsule TAKE 1 CAPSULE BY MOUTH TWICE A DAY    diclofenac Sodium (VOLTAREN) 1 % GEL APPLY AS NEEDED 3 TIMES A DAY    DULoxetine (CYMBALTA) 60 MG capsule TAKE 1 CAPSULE (60 MG TOTAL) BY MOUTH DAILY. PLEASE MAKE AN APPOINTMENT WITH YOUR PCP    fentaNYL (DURAGESIC) 75 MCG/HR Place 1 patch onto the skin every 3 (three) days.    fexofenadine (ALLEGRA) 180 MG tablet Take 180 mg by mouth daily.    fluticasone (FLONASE) 50 MCG/ACT nasal spray     Fluticasone-Salmeterol (ADVAIR) 250-50 MCG/DOSE AEPB Inhale 1 puff into the lungs every 12 (twelve) hours. Rinse mouth after use     gabapentin (NEURONTIN) 800 MG tablet TAKE 1 TABLET BY MOUTH 3 TIMES DAILY.    haloperidol (HALDOL) 0.5 MG tablet Take 0.5 tablets (0.25 mg total) by mouth 2 (two) times daily.    Na Sulfate-K Sulfate-Mg Sulf 17.5-3.13-1.6 GM/177ML SOLN See admin instructions.    naloxone (NARCAN) nasal spray 4 mg/0.1 mL Place 1 spray into the nose as needed.    omeprazole (PRILOSEC) 40 MG capsule Take 1 capsule (40 mg total) by mouth 2 (two) times daily. SCHEDULE OFFICE VISIT    ondansetron (ZOFRAN-ODT) 4 MG disintegrating tablet DISSOLVE 1 TABLET IN MOUTH EVERY 8 HOURS FOR NAUSEA AND VOMITING    QUEtiapine (SEROQUEL) 25 MG tablet TAKE 1 TABLET (25 MG TOTAL) BY MOUTH AT BEDTIME. PLEASE MAKE AN APPOINTMENT WITH YOUR PCP    trimethoprim-polymyxin b (POLYTRIM) ophthalmic solution Place 1 drop into both eyes every 6 (six) hours.    tiZANidine (ZANAFLEX) 4 MG tablet TAKE 0.5-1 TABLETS (2-4 MG TOTAL) BY MOUTH EVERY 8 (EIGHT) HOURS AS NEEDED FOR MUSCLE SPASMS. (Patient not taking: Reported on 03/21/2022) 03/21/2022: Pt states it makes her depressed   No facility-administered medications prior to visit.        Objective    BP 109/86 (BP Location: Right Arm, Patient Position: Sitting, Cuff Size: Normal)   Pulse 90   Temp 98.5 F (36.9 C) (Oral)   Resp 16  SpO2 100%    Physical Exam  General appearance: Well developed, well nourished female, cooperative, sitting comfortablyin motorized wheelchair and in no acute distress Head: Normocephalic, without obvious abnormality, atraumatic Respiratory: Respirations even and unlabored, normal respiratory rate Extremities: Karen [name] extremities are intact.  Skin: Skin color, texture, turgor normal. No rashes seen  Psych: Appropriate mood and affect. Neurologic: Mental status: Alert, oriented to person, place, and time, thought content appropriate.   Results for orders placed or performed in visit on 03/21/22  Results for orders placed or performed in visit on 03/21/22  POCT  Urinalysis Dipstick  Result Value Ref Range   Color, UA yellow    Clarity, UA cloudy    Glucose, UA Negative Negative   Bilirubin, UA neg    Ketones, UA neg    Spec Grav, UA <=1.005 (A) 1.010 - 1.025   Blood, UA trace    pH, UA 5.0 5.0 - 8.0   Protein, UA Negative Negative   Urobilinogen, UA 1.0 0.2 or 1.0 E.U./dL   Nitrite, UA pos    Leukocytes, UA Large (3+) (A) Negative   Appearance     Odor normal     Assessment & Plan     1. Pain in toes of both feet  She would like to follow up with Dr. Lucia Karen [name].  - Ambulatory referral to Orthopedic Surgery  2. Ehlers-Danlos syndrome   3. Depression with anxiety   4. Major depressive disorder, recurrent episode, moderate (HCC)    5. History of psychosis  - Ambulatory referral to Psychiatry  She had several month period of psychotic depression in 2022, thought to have first been precipitated by prolonged UTI during which she (unknowingly to patient's mother at the time) stopped taking Karen [name] of her her psychiatric medications. This included her antidepressants, Seroquel (which had originally prescribed for insomnia), and haldol (which was initially prescribed by her pediatric neurologist for tics). She eventually required five rounds of ECT  at [facility] in March 2023 which broke the psychotic episode and she has been back to her baseline ever since. She had follow up with Dr. Modesta Karen [name] at [facility], but was not satisfied with her care. She request referral to another psychiatrist for follow up.   She is current stable on current medication regiment.   6. Encounter for screening mammogram for malignant neoplasm of breast  - MM 3D SCREEN BREAST BILATERAL; Future  7. Dysuria  - Urine Culture  8. Acute cystitis with hematuria  - sulfamethoxazole-trimethoprim (BACTRIM DS) 800-160 MG tablet; Take 1 tablet by mouth 2 (two) times daily for 7 days.  Dispense: 14 tablet; Refill: 0  Refill - DULoxetine (CYMBALTA) 60 MG capsule; Take 2 capsules (120 mg  total) by mouth daily. Please make an appointment with your PCP  Dispense: 120 capsule; Refill: 3 - diclofenac Sodium (VOLTAREN) 1 % GEL; APPLY AS NEEDED 3 TIMES A DAY  Dispense: 100 g; Refill: 3      The entirety of the information documented in the History of Present Illness, Review of Systems and Physical Exam were personally obtained by me. Portions of this information were initially documented by the CMA and reviewed by me for thoroughness and accuracy.     Lelon Huh, MD  [facility] [phone] (phone) [phone] (fax)  [place]

## 2022-03-23 ENCOUNTER — Other Ambulatory Visit: Payer: Self-pay

## 2022-03-24 LAB — URINE CULTURE

## 2022-03-30 ENCOUNTER — Other Ambulatory Visit: Payer: Self-pay | Admitting: Physician Assistant

## 2022-04-04 ENCOUNTER — Ambulatory Visit (INDEPENDENT_AMBULATORY_CARE_PROVIDER_SITE_OTHER): Payer: Medicare Other | Admitting: Family

## 2022-04-04 ENCOUNTER — Other Ambulatory Visit: Payer: Self-pay | Admitting: Family Medicine

## 2022-04-04 ENCOUNTER — Encounter (INDEPENDENT_AMBULATORY_CARE_PROVIDER_SITE_OTHER): Payer: Self-pay | Admitting: Family

## 2022-04-04 VITALS — BP 110/80 | HR 82 | Wt 121.2 lb

## 2022-04-04 DIAGNOSIS — Z79899 Other long term (current) drug therapy: Secondary | ICD-10-CM

## 2022-04-04 DIAGNOSIS — G43009 Migraine without aura, not intractable, without status migrainosus: Secondary | ICD-10-CM

## 2022-04-04 DIAGNOSIS — F333 Major depressive disorder, recurrent, severe with psychotic symptoms: Secondary | ICD-10-CM

## 2022-04-04 DIAGNOSIS — Q796 Ehlers-Danlos syndrome, unspecified: Secondary | ICD-10-CM

## 2022-04-04 DIAGNOSIS — G2569 Other tics of organic origin: Secondary | ICD-10-CM

## 2022-04-04 DIAGNOSIS — G8929 Other chronic pain: Secondary | ICD-10-CM

## 2022-04-04 DIAGNOSIS — G4710 Hypersomnia, unspecified: Secondary | ICD-10-CM

## 2022-04-04 DIAGNOSIS — M797 Fibromyalgia: Secondary | ICD-10-CM

## 2022-04-04 DIAGNOSIS — G4739 Other sleep apnea: Secondary | ICD-10-CM | POA: Diagnosis not present

## 2022-04-04 DIAGNOSIS — G44221 Chronic tension-type headache, intractable: Secondary | ICD-10-CM

## 2022-04-04 NOTE — Telephone Encounter (Signed)
Medication Refill - Medication: fentaNYL ([place]) 75 MCG/HR  Has the patient contacted their pharmacy? No.  (Agent: If no, request that the patient contact the pharmacy for the refill. If patient does not wish to contact the pharmacy document the reason why and proceed with request.)   Preferred Pharmacy (with phone number or street name):  [name] [id number] Phone: [phone]: [phone] Hours: Not open 24 hours   Has the patient been seen for an appointment in the last year OR does the patient have an upcoming appointment? Yes.    Agent: Please be advised that RX refills may take up to 3 business days. We ask that you follow-up with your pharmacy.

## 2022-04-04 NOTE — Patient Instructions (Signed)
It was a pleasure to see you today!  Instructions for you until your next appointment are as follows: Contact Dr Janee Morn office to follow up on the [place] and sleep problems Contact your gynecologist to follow up about your periods and your hair loss Be sure to schedule an appointment with your new psychiatrist when possible Please sign up for MyChart if you have not done so. Please plan to return for follow up in 6 months or sooner if needed.   Feel free to contact our office during normal business hours at [phone] with questions or concerns. If there is no answer or the call is outside business hours, please leave a message and our clinic staff will call you back within the next business day.  If you have an urgent concern, please stay on the line for our after-hours answering service and ask for the on-call neurologist.     I also encourage you to use MyChart to communicate with me more directly. If you have not yet signed up for MyChart within [facility], the front desk staff can help you. However, please note that this inbox is NOT monitored on nights or weekends, and response can take up to 2 business days.  Urgent matters should be discussed with the on-call pediatric neurologist.   At [facility], we are committed to providing exceptional care. You will receive a patient satisfaction survey through text or email regarding your visit today. Your opinion is important to me. Comments are appreciated.

## 2022-04-05 MED ORDER — FENTANYL 75 MCG/HR TD PT72: 1.0000 | MEDICATED_PATCH | TRANSDERMAL | 0 refills | Status: DC

## 2022-04-05 NOTE — Telephone Encounter (Signed)
Requested medication (s) are due for refill today - yes  Requested medication (s) are on the active medication list -yes  Future visit scheduled -yes  Last refill: 03/08/22 #10  Notes to clinic: non delegated Rx  Requested Prescriptions  Pending Prescriptions Disp Refills   fentaNYL (DURAGESIC) 75 MCG/HR 10 patch 0    Sig: Place 1 patch onto the skin every 3 (three) days.     Not Delegated - Analgesics:  Opioid Agonists Failed - 04/04/2022  1:30 PM      Failed - This refill cannot be delegated      Passed - Urine Drug Screen completed in last 360 days      Passed - Valid encounter within last 3 months    Recent Outpatient Visits           2 weeks ago Pain in toes of both feet   [facility] Birdie Sons, MD   2 months ago Chronic bilateral back pain, unspecified back location   [facility] Birdie Sons, MD   3 months ago DDD (degenerative disc disease), lumbar   [facility] Birdie Sons, MD   4 months ago Conjunctivitis of both eyes, unspecified conjunctivitis type   [facility] Thedore Mins, [place], PA-C   5 months ago [place], Donald E, MD       Future Appointments             In 1 month Fisher, Kirstie Peri, MD [facility]   In 2 months Ralene Bathe, MD [place]   In 3 months McGowan, Gordan Payment [place]               Requested Prescriptions  Pending Prescriptions Disp Refills   fentaNYL (DURAGESIC) 75 MCG/HR 10 patch 0    Sig: Place 1 patch onto the skin every 3 (three) days.     Not Delegated - Analgesics:  Opioid Agonists Failed - 04/04/2022  1:30 PM      Failed - This refill cannot be delegated      Passed - Urine Drug Screen completed in last 360 days      Passed - Valid encounter within last 3 months    Recent Outpatient Visits           2 weeks ago Pain in toes of both feet    [facility] Birdie Sons, MD   2 months ago Chronic bilateral back pain, unspecified back location   [facility] Birdie Sons, MD   3 months ago DDD (degenerative disc disease), lumbar   [facility] Birdie Sons, MD   4 months ago Conjunctivitis of both eyes, unspecified conjunctivitis type   [facility] Thedore Mins, [place], PA-C   5 months ago [place], Donald E, MD       Future Appointments             In 1 month Fisher, Kirstie Peri, MD [facility], [place]   In 2 months Ralene Bathe, MD [place]   In 3 months [place], [place], [place]

## 2022-04-06 DIAGNOSIS — H5203 Hypermetropia, bilateral: Secondary | ICD-10-CM | POA: Diagnosis not present

## 2022-04-07 ENCOUNTER — Encounter (INDEPENDENT_AMBULATORY_CARE_PROVIDER_SITE_OTHER): Payer: Self-pay | Admitting: Family

## 2022-04-07 NOTE — Progress Notes (Signed)
Karen [name]   MRN:  [id number]  1976-05-24   Provider: Rockwell Germany NP-C Location of Care: [facility]  Visit type: Return visit  Last visit: 02/02/2022  Referral source: Birdie Sons, MD  History from: Epic chart, patient and her mother  Brief history:  Copied from previous record: She has a chronic pain disorder related to Ehlers-Danlos syndrome with hypermobile joints.  She takes a variety of narcotic and nonnarcotic medications in order to control her pain.  These are prescribed by her primary physician, Dr. Caryn Section of [facility] family practice.   In addition she has a chronic daily tension headache and has occasional migraines.  Gabapentin has been prescribed and has kept them in good control.   She is followed by pulmonologist Baird Lyons for [facility] because of obstructive and central apnea.  She has a BiPAP machine. She has a delayed circadian rhythm going to bed between 2 and 3 in the morning and sleeping until noon time.   She has excessive daytime sleepiness and problems with focus.  This has been treated with Adderall by her pulmonologist.   As an adult she developed a motor tic disorder.  Currently this involves jerking movements of her legs and elevation of her arms to the elbows toward her shoulders.  She has some vocalizations that sound like a mixture of a sign of moaned that she has had for a number of years. She believes that when she takes excessive amounts of caffeine that that makes her tics worse.     She has bilateral eyelid ptosis.  On occasion particularly later in the day she has closure of her left eyelid and has difficulty opening it. She has nearsightedness.   In addition to these problems, Izzabelle also has a significant problem with mood. She has had recent admissions to the hospital for psychosis, depression and catatonia. Her mother notes that she is paranoid and wonders if she has schizophrenia  Today's  concerns: Brianna reports today that she has been unable to tolerate her Bipap and is very tired because of lack of sleep. She didn't use the device for some time earlier this year while in a catatonic state. She tried to restart use and says that she can't sleep with it on.   Starlynn reports that her tics are worse because she is tired. She asked about medication to help her with the tics or to help tolerate the Bipap. She reports tics with an exaggerated blink to her eyes, elevating her arms toward her head, stretching movements of her neck and a moaning type vocalization.  Carola also reports hair loss. She says that she is also not having menstrual periods and wonders if that is related to the hair loss.   Valary says that she has not yet gotten established with a new psychiatrist or therapist. She has been otherwise generally healthy since she was last seen. Neither she nor her mother have other health concerns for her  today other than previously mentioned.  Review of systems: Please see HPI for neurologic and other pertinent review of systems. Otherwise all other systems were reviewed and were negative.  Problem List: Patient Active Problem List   Diagnosis Date Noted   History of colonic polyps    Polyp of descending colon    History of closed head injury 11/09/2021   Urinary tract infection without hematuria    Thyroid nodule 10/05/2021   Catatonia 10/05/2021   Depression, major, recurrent, severe with  psychosis ([place]) 10/05/2021   Polypharmacy 08/12/2021   Major depressive disorder, recurrent episode, moderate ([place]) 05/01/2021   Asthma 04/22/2021   Paranoia (psychosis) ([place]) 04/22/2021   Psychosis ([place]) 04/22/2021   Urinary retention 04/22/2021   Chronic tension type headache 06/04/2020   Fibrocystic breast changes, bilateral 10/09/2019   Family history of colon cancer    Rectal polyp    Heartburn    Excessive somnolence disorder 06/28/2017   Difficulty hearing 08/31/2015    Back pain, chronic 08/31/2015   Rheumatoid arthritis ([place]) 08/31/2015   Alopecia 03/10/2015   Arthritis 03/10/2015   Allergic asthma, mild persistent, uncomplicated 22/09/5425   Chronic nausea 03/10/2015   DDD (degenerative disc disease), lumbar 03/10/2015   Depression with anxiety 03/10/2015   Personal history of MRSA (methicillin resistant Staphylococcus aureus) 03/10/2015   Insomnia 03/10/2015   Mitral valve prolapse 03/10/2015   Paresthesias/numbness 03/10/2015   Allergic rhinitis, seasonal 03/10/2015   Seizure disorder ([place]) 03/10/2015   Mixed sleep apnea, Central predominant 03/10/2015   Migraine without aura 01/27/2014   Fibromyalgia 01/27/2014   Tics of organic origin 01/07/2014   Ehlers-Danlos syndrome 01/07/2014     Past Medical History:  Diagnosis Date   Ehlers-Danlos syndrome    Fibromyalgia    Headache    Sleep apnea    Tics of organic origin    Transient alteration of awareness     Past medical history comments: See HPI Copied from previous record: She had 2 head injuries as an adolescent, one of which resulted in a post concussion condition that kept her out of school for a year.   She has a chronic pain syndrome from her Ehlers-Danlos and has been on a variety of medications including Celebrex, tizanidine, Lidoderm patches, fentanyl patches, hydrocodone, Lyrica, gabapentin, and Cymbalta.   She is also had problems with sinusitis, iron deficiency, and chronic lumbar pain.  Other medical problems include osteoarthritis, fibromyalgia, asthma.  She had seizure-like events in the past that were nonepileptic proven by EEGs capturing the behavior without electrographic seizure activity.   Behavior History Anxiety and depression  Surgical history: Past Surgical History:  Procedure Laterality Date   COLONOSCOPY WITH PROPOFOL N/A 02/13/2018   Procedure: COLONOSCOPY WITH PROPOFOL;  Surgeon: [name], MD;  Location: [facility] ENDOSCOPY;  Service: Endoscopy;   Laterality: N/A;   COLONOSCOPY WITH PROPOFOL N/A 01/11/2022   Procedure: COLONOSCOPY WITH PROPOFOL;  Surgeon: [name], MD;  Location: [facility] ENDOSCOPY;  Service: Endoscopy;  Laterality: N/A;   DILATION AND CURETTAGE OF UTERUS  08/16/2007   ESOPHAGOGASTRODUODENOSCOPY (EGD) WITH PROPOFOL  02/13/2018   Procedure: ESOPHAGOGASTRODUODENOSCOPY (EGD) WITH PROPOFOL;  Surgeon: [name], MD;  Location: [place] ENDOSCOPY;  Service: Endoscopy;;   EYE SURGERY Left 08/15/1988   3 Surgeries on left eye to correct crossed eye   LAPAROSCOPY Left 08/15/2001   Fallopian Tube   MANDIBLE SURGERY  08/15/1996   OTHER SURGICAL HISTORY Right 08/15/1985   3 Surgeries to repair broken arm   OTHER SURGICAL HISTORY Left    3 surgeries on her left thigh as an infant   OTHER SURGICAL HISTORY  08/15/1996   Jaw surgery   Right arm surgery  08/15/1985   x 3 due to fracture   TOENAIL EXCISION Bilateral 06/13/2019   Procedure: BILATERAL SECOND TOE PARTIAL NAIL ABLATION;  Surgeon: Erle Crocker, MD;  Location: [facility];  Service: Orthopedics;  Laterality: Bilateral;  SURGERY REQUEST TIME 1 HOUR   TONSILLECTOMY  12/13/2002   TONSILLECTOMY  Family history: family history includes Asperger's syndrome in her brother, cousin, maternal aunt, maternal grandfather, and another family member; Asthma in her brother, maternal grandfather, and paternal grandfather; Breast cancer in her maternal aunt; Cancer in her cousin and maternal aunt; Cervical cancer in her maternal grandmother; Colon cancer in her maternal grandfather; Depression in her brother and mother; Ehlers-Danlos syndrome in her brother and father; Emphysema in her maternal grandfather; Fibromyalgia in her mother; Heart Problems in her paternal grandmother; Heart attack in her paternal grandfather; Heart disease in her paternal aunt and paternal uncle; Migraines in her brother; Osteoarthritis in her maternal grandmother and mother; Osteoporosis  in her maternal grandmother; Other in her brother; Prostate cancer in her maternal grandfather; Stroke in her mother; Tuberculosis in her paternal grandfather.   Social history: Social History   Socioeconomic History   Marital status: Single    Spouse name: Not on file   Number of children: 0   Years of education: Not on file   Highest education level: Bachelor's degree (e.g., BA, AB, BS)  Occupational History   Not on file  Tobacco Use   Smoking status: Never   Smokeless tobacco: Never  Vaping Use   Vaping Use: Never used  Substance and Sexual Activity   Alcohol use: No    Alcohol/week: 0.0 standard drinks of alcohol   Drug use: No   Sexual activity: Never  Other Topics Concern   Not on file  Social History Narrative   Jakyia is 44.   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    She lives with her mother.    She enjoys reading, watching TV, writing, and painting.    Social Determinants of Health   Financial Resource Strain: Low Risk  (12/01/2021)   Overall Financial Resource Strain (CARDIA)    Difficulty of Paying Living Expenses: Not very hard  Food Insecurity: No Food Insecurity (12/01/2021)   Hunger Vital Sign    Worried About Running Out of Food in the Last Year: Never true    Ran Out of Food in the Last Year: Never true  Transportation Needs: No Transportation Needs (12/01/2021)   PRAPARE - Hydrologist (Medical): No    Lack of Transportation (Non-Medical): No  Physical Activity: Insufficiently Active (12/01/2021)   Exercise Vital Sign    Days of Exercise per Week: 2 days    Minutes of Exercise per Session: 30 min  Stress: No Stress Concern Present (12/01/2021)   [place]    Feeling of Stress : Not at all  Social Connections: Socially Isolated (12/01/2021)   Social Connection and Isolation Panel [NHANES]    Frequency of Communication with Friends and Family: Twice a week     Frequency of Social Gatherings with Friends and Family: Once a week    Attends Religious Services: Never    Marine scientist or Organizations: No    Attends Archivist Meetings: Never    Marital Status: Never married  Intimate Partner Violence: Not At Risk (12/01/2021)   Humiliation, Afraid, Rape, and Kick questionnaire    Fear of Current or Ex-Partner: No    Emotionally Abused: No    Physically Abused: No    Sexually Abused: No    Past/failed meds:  Allergies: Allergies  Allergen Reactions   Augmentin  [Amoxicillin-Pot Clavulanate] Diarrhea   Chocolate Flavor     Other reaction(s): Other (See Comments) Wheezing, acne   Demerol [Meperidine]  Other (See Comments)    Slow to wake up when this drug is given.    Keflex [Cephalexin] Nausea And Vomiting   Morphine And Related Nausea And Vomiting   Tape Dermatitis    Must use paper tape only   Toradol [Ketorolac Tromethamine] Nausea And Vomiting   Amoxicillin     Other reaction(s): Unknown   Chocolate     GI distress   Nsaids     patient develops ulcers Other reaction(s): Other (See Comments) patient develops ulcers   Strawberry Extract     GI distress    Immunizations: Immunization History  Administered Date(s) Administered   H1N1 06/03/2008   HPV 9-valent 07/19/2018   Influenza Split 06/14/2012   Influenza,inj,Quad PF,6+ Mos 10/01/2018, 05/26/2020, 07/19/2021   Influenza-Unspecified 05/24/2017, 05/16/2019   Moderna Covid-19 Vaccine Bivalent Booster 98yr & up 07/19/2021   PFIZER(Purple Top)SARS-COV-2 Vaccination 11/05/2019, 11/26/2019, 05/26/2020   Pneumococcal Polysaccharide-23 05/24/2017   Tdap 06/14/2012    Diagnostics/Screenings::  Physical Exam: BP 110/80   Pulse 82   Wt 121 lb 4 oz (55 kg)   BMI 24.08 kg/m   General: well developed, well nourished woman, seated in wheelchair, in no evident distress Head: normocephalic and atraumatic. Oropharynx benign. No dysmorphic features. Neck:  supple Cardiovascular: regular rate and rhythm, no murmurs. Respiratory: clear to auscultation bilaterally Abdomen: bowel sounds present all four quadrants, abdomen soft, non-tender, non-distended.  Musculoskeletal: no skeletal deformities or obvious scoliosis.  Skin: no rashes or neurocutaneous lesions  Neurologic Exam Mental Status: awake and fully alert. Yawns frequently. Oriented to time and place. Attention span and fund of knowledge appropriate. Mood and affect flat.  Cranial Nerves: fundoscopic exam - red reflex present.  Unable to fully visualize fundus.  Pupils equal briskly reactive to light.  Turns to localize faces and objects in the periphery. Turns to localize sounds in the periphery. Facial movements are symmetric. Motor: mild diffuse weakness in all extremities Sensory: withdrawal x 4 Coordination: finger to nose performed accurately bilaterally Gait and Station: I did not get her out of her wheelchair  Impression: Tics of organic origin  Ehlers-Danlos syndrome  Polypharmacy  Fibromyalgia  Mixed sleep apnea, Central predominant  Depression, major, recurrent, severe with psychosis (HCC)  Excessive somnolence disorder  Chronic tension-type headache, intractable  Migraine without aura and without status migrainosus, not intractable    Recommendations for plan of care: The patient's previous Epic records were reviewed. VAnnelieshas neither had nor required imaging or lab studies since the last visit. She reports being intolerant of Bipap and being tired from lack of sleep. I explained that medication will not help with the tics that are triggered by fatigue and instructed her to contact her pulmonologist about that. She also reports hair loss and lack of menstrual periods. I recommended that she follow up with her gynecologist for these concerns. Finally I strongly encouraged her to get established with new behavioral health providers.   I will see her back in follow  up in 6 months or sooner if needed.   The medication list was reviewed and reconciled. No changes were made in the prescribed medications today. A complete medication list was provided to the patient.  Return in about 6 months (around 10/05/2022).   Allergies as of 04/04/2022       Reactions   Augmentin  [amoxicillin-pot Clavulanate] Diarrhea   Chocolate Flavor    Other reaction(s): Other (See Comments) Wheezing, acne   Demerol [meperidine] Other (See Comments)   Slow to  wake up when this drug is given.    Keflex [cephalexin] Nausea And Vomiting   Morphine And Related Nausea And Vomiting   Tape Dermatitis   Must use paper tape only   Toradol [ketorolac Tromethamine] Nausea And Vomiting   Amoxicillin    Other reaction(s): Unknown   Chocolate    GI distress   Nsaids    patient develops ulcers Other reaction(s): Other (See Comments) patient develops ulcers   Strawberry Extract    GI distress        Medication List        Accurate as of April 04, 2022 11:59 PM. If you have any questions, ask your nurse or doctor.          albuterol 108 (90 Base) MCG/ACT inhaler Commonly known as: Ventolin HFA Inhale 2 puffs into the lungs every 6 (six) hours as needed.   busPIRone 10 MG tablet Commonly known as: BUSPAR Take 10 mg by mouth 2 (two) times daily.   busPIRone 15 MG tablet Commonly known as: BUSPAR Take 15 mg by mouth 2 (two) times daily.   celecoxib 200 MG capsule Commonly known as: CELEBREX TAKE 1 CAPSULE BY MOUTH TWICE A DAY   diclofenac Sodium 1 % Gel Commonly known as: VOLTAREN APPLY AS NEEDED 3 TIMES A DAY   DULoxetine 60 MG capsule Commonly known as: CYMBALTA Take 2 capsules (120 mg total) by mouth daily. Please make an appointment with your PCP   fentaNYL 75 MCG/HR Commonly known as: [place] 1 patch onto the skin every 3 (three) days.   fexofenadine 180 MG tablet Commonly known as: ALLEGRA Take 180 mg by mouth daily.   fluticasone 50  MCG/ACT nasal spray Commonly known as: FLONASE   Fluticasone-Salmeterol 250-50 MCG/DOSE Aepb Commonly known as: ADVAIR Inhale 1 puff into the lungs every 12 (twelve) hours. Rinse mouth after use   gabapentin 800 MG tablet Commonly known as: NEURONTIN TAKE 1 TABLET BY MOUTH 3 TIMES DAILY.   haloperidol 0.5 MG tablet Commonly known as: HALDOL Take 0.5 tablets (0.25 mg total) by mouth 2 (two) times daily.   Na Sulfate-K Sulfate-Mg Sulf 17.5-3.13-1.6 GM/177ML Soln See admin instructions.   naloxone 4 MG/0.1ML Liqd nasal spray kit Commonly known as: NARCAN Place 1 spray into the nose as needed.   omeprazole 40 MG capsule Commonly known as: PRILOSEC Take 1 capsule (40 mg total) by mouth 2 (two) times daily. SCHEDULE OFFICE VISIT   ondansetron 4 MG disintegrating tablet Commonly known as: ZOFRAN-ODT DISSOLVE 1 TABLET IN MOUTH EVERY 8 HOURS FOR NAUSEA AND VOMITING   QUEtiapine 25 MG tablet Commonly known as: SEROQUEL Take 1 tablet (25 mg total) by mouth at bedtime.   tiZANidine 4 MG tablet Commonly known as: ZANAFLEX TAKE 0.5-1 TABLETS (2-4 MG TOTAL) BY MOUTH EVERY 8 (EIGHT) HOURS AS NEEDED FOR MUSCLE SPASMS.   trimethoprim-polymyxin b ophthalmic solution Commonly known as: POLYTRIM Place 1 drop into both eyes every 6 (six) hours.      Total time spent with the patient was 20 minutes, of which 50% or more was spent in counseling and coordination of care.  Rockwell Germany NP-C [facility] Ph. [phone] Fax [phone]

## 2022-04-12 ENCOUNTER — Telehealth: Payer: Self-pay | Admitting: Internal Medicine

## 2022-04-12 ENCOUNTER — Other Ambulatory Visit: Payer: Self-pay | Admitting: Family Medicine

## 2022-04-13 ENCOUNTER — Ambulatory Visit: Payer: Self-pay

## 2022-04-13 ENCOUNTER — Telehealth: Payer: Self-pay

## 2022-04-13 NOTE — Telephone Encounter (Signed)
Pt mother states that she was treated with ECT by Dr. [name] in the Spring and was doing well but Is now having auditory hallucinations. She refuses to take patient to the ED or [facility]. She is requesting Dr. [name] start another round of ECT.

## 2022-04-13 NOTE — Telephone Encounter (Signed)
  Chief Complaint: Pt is having a psychotic episode Symptoms: Pt believes that box in the living room is her brother's voice insulting her Frequency: a few days Pertinent Negatives: Patient denies  Disposition: '[]'$ ED /'[]'$ Urgent Care (no appt availability in office) / '[]'$ Appointment(In office/virtual)/ '[]'$  [place] [facility]/ '[]'$ Home Care/ '[]'$ Refused Recommended Disposition /'[]'$ [place] [facility]/ '[x]'$  Follow-up with PCP Additional Notes: Pt's mom called. Pt is having an episode of psychosis. PT believes there is is box in the living room that has her brother's voice and is insulting her. Mother states she does not want to call EMS to take pt to behavioral health as they will not treat her well.  Mom has no fear that pt will hurt her, she will call 911 if this changes.  Mom would like referral ASAP for ECT with Dr. [name].     Answer Assessment - Initial Assessment Questions 1. REASON FOR CALL or QUESTION: "What is your reason for calling today?" or "How can I best help you?" or "What question do you have that I can help answer?"     PT needs a referral For ECT to see Dr. [name] ASAP.  Protocols used: Information Only Call - No Triage-A-AH

## 2022-04-13 NOTE — Telephone Encounter (Signed)
She can give additional '25mg'$  Seroquel now, then start giving her 2 at night instead of one. Should also reduce duloxetine to 1 tablet a day instead of 2. However, if she is not improving she should probably take her to ER anyway to check blood work and make sure she's not developing a UTI.

## 2022-04-13 NOTE — Telephone Encounter (Signed)
Dr. [name] is inpatient only, he does not take referrals. ECT can only be arranged by psychiatrist. If she is taking medications then we could go up on Seroquel dosage, otherwise she will have to go to [facility] ER and see psychiatric nurse practitioner and would probable see Dr. [name] as inpatient. Karen [name]

## 2022-04-14 ENCOUNTER — Ambulatory Visit: Payer: Medicare Other | Admitting: Obstetrics & Gynecology

## 2022-04-14 NOTE — Telephone Encounter (Signed)
Mother has been advised

## 2022-04-15 NOTE — Telephone Encounter (Signed)
LMTCB   Attempted to obtain resemd cpap report, no data available

## 2022-04-19 ENCOUNTER — Other Ambulatory Visit: Payer: Self-pay | Admitting: Psychiatry

## 2022-04-19 DIAGNOSIS — R200 Anesthesia of skin: Secondary | ICD-10-CM

## 2022-04-20 ENCOUNTER — Ambulatory Visit: Payer: Self-pay | Admitting: Registered Nurse

## 2022-04-20 ENCOUNTER — Encounter: Payer: Self-pay | Admitting: Registered Nurse

## 2022-04-20 ENCOUNTER — Telehealth (INDEPENDENT_AMBULATORY_CARE_PROVIDER_SITE_OTHER): Payer: Medicare Other

## 2022-04-20 ENCOUNTER — Encounter
Admission: RE | Admit: 2022-04-20 | Discharge: 2022-04-20 | Disposition: A | Discharge status: Home / Self Care | Payer: Medicare Other | Source: Ambulatory Visit | Attending: Psychiatry | Admitting: Psychiatry

## 2022-04-20 DIAGNOSIS — F418 Other specified anxiety disorders: Secondary | ICD-10-CM | POA: Diagnosis not present

## 2022-04-20 DIAGNOSIS — R200 Anesthesia of skin: Secondary | ICD-10-CM | POA: Insufficient documentation

## 2022-04-20 DIAGNOSIS — R4182 Altered mental status, unspecified: Secondary | ICD-10-CM | POA: Diagnosis not present

## 2022-04-20 DIAGNOSIS — F323 Major depressive disorder, single episode, severe with psychotic features: Secondary | ICD-10-CM | POA: Diagnosis not present

## 2022-04-20 DIAGNOSIS — F333 Major depressive disorder, recurrent, severe with psychotic symptoms: Secondary | ICD-10-CM | POA: Diagnosis not present

## 2022-04-20 MED ORDER — MIDAZOLAM HCL 2 MG/2ML IJ SOLN
2.0000 mg | Freq: Once | INTRAMUSCULAR | Status: AC
  Administered 2022-04-20: 2 mg via INTRAVENOUS

## 2022-04-20 MED ORDER — LABETALOL HCL 5 MG/ML IV SOLN
INTRAVENOUS | Status: DC | PRN
  Administered 2022-04-20: 5 mg via INTRAVENOUS

## 2022-04-20 MED ORDER — SODIUM CHLORIDE 0.9 % IV SOLN
500.0000 mL | Freq: Once | INTRAVENOUS | Status: AC
Start: 2022-04-20 — End: 2022-04-20
  Administered 2022-04-20: 500 mL via INTRAVENOUS

## 2022-04-20 MED ORDER — LABETALOL HCL 5 MG/ML IV SOLN
INTRAVENOUS | Status: AC
  Filled 2022-04-20: qty 4

## 2022-04-20 MED ORDER — HYDRALAZINE HCL 20 MG/ML IJ SOLN
INTRAMUSCULAR | Status: AC
  Filled 2022-04-20: qty 1

## 2022-04-20 MED ORDER — SODIUM CHLORIDE 0.9 % IV SOLN: INTRAVENOUS | Status: DC | PRN

## 2022-04-20 MED ORDER — METHOHEXITAL SODIUM 100 MG/10ML IV SOSY
PREFILLED_SYRINGE | INTRAVENOUS | Status: DC | PRN
  Administered 2022-04-20: 60 mg via INTRAVENOUS

## 2022-04-20 MED ORDER — MIDAZOLAM HCL 2 MG/2ML IJ SOLN
INTRAMUSCULAR | Status: AC
  Filled 2022-04-20: qty 2

## 2022-04-20 MED ORDER — SUCCINYLCHOLINE CHLORIDE 200 MG/10ML IV SOSY
PREFILLED_SYRINGE | INTRAVENOUS | Status: DC | PRN
  Administered 2022-04-20: 60 mg via INTRAVENOUS

## 2022-04-20 NOTE — Telephone Encounter (Signed)
[facility] with Pt's mom, Manuela Schwartz for more details.

## 2022-04-20 NOTE — Anesthesia Preprocedure Evaluation (Signed)
Anesthesia Evaluation  Patient identified by MRN, date of birth, ID band Patient awake    Reviewed: Allergy & Precautions, NPO status , Patient's Chart, lab work & pertinent test results  History of Anesthesia Complications Negative for: history of anesthetic complications  Airway Mallampati: II  TM Distance: <3 FB Neck ROM: full    Dental  (+) Poor Dentition, Missing   Pulmonary asthma , sleep apnea and Continuous Positive Airway Pressure Ventilation ,    Pulmonary exam normal        Cardiovascular Exercise Tolerance: Poor (-) anginaNormal cardiovascular exam+ Valvular Problems/Murmurs MVP      Neuro/Psych  Headaches, Seizures -, Well Controlled,  PSYCHIATRIC DISORDERS  Neuromuscular disease    GI/Hepatic negative GI ROS, Neg liver ROS, neg GERD  ,  Endo/Other  negative endocrine ROS  Renal/GU negative Renal ROS  negative genitourinary   Musculoskeletal  (+) Arthritis , Fibromyalgia -  Abdominal Normal abdominal exam  (+)   Peds  Hematology negative hematology ROS (+)   Anesthesia Other Findings Past Medical History: No date: Ehlers-Danlos syndrome No date: Fibromyalgia No date: Headache No date: Sleep apnea No date: Tics of organic origin No date: Transient alteration of awareness  Past Surgical History: 02/13/2018: COLONOSCOPY WITH PROPOFOL; N/A     Comment:  Procedure: COLONOSCOPY WITH PROPOFOL;  Surgeon: [name], MD;  Location: [facility] ENDOSCOPY;  Service:               Endoscopy;  Laterality: N/A; 08/16/2007: DILATION AND CURETTAGE OF UTERUS 02/13/2018: ESOPHAGOGASTRODUODENOSCOPY (EGD) WITH PROPOFOL     Comment:  Procedure: ESOPHAGOGASTRODUODENOSCOPY (EGD) WITH               PROPOFOL;  Surgeon: [name], MD;  Location: [facility]               ENDOSCOPY;  Service: Endoscopy;; 08/15/1988: EYE SURGERY; Left     Comment:  3 Surgeries on left eye to correct crossed eye 08/15/2001:  LAPAROSCOPY; Left     Comment:  Fallopian Tube 08/15/1996: MANDIBLE SURGERY 08/15/1985: OTHER SURGICAL HISTORY; Right     Comment:  3 Surgeries to repair broken arm No date: OTHER SURGICAL HISTORY; Left     Comment:  3 surgeries on her left thigh as an infant 08/15/1996: OTHER SURGICAL HISTORY     Comment:  Jaw surgery 08/15/1985: Right arm surgery     Comment:  x 3 due to fracture 06/13/2019: TOENAIL EXCISION; Bilateral     Comment:  Procedure: BILATERAL SECOND TOE PARTIAL NAIL ABLATION;                Surgeon: Erle Crocker, MD;  Location: [place];  Service: Orthopedics;  Laterality:               Bilateral;  SURGERY REQUEST TIME 1 HOUR 12/13/2002: TONSILLECTOMY No date: TONSILLECTOMY  BMI    Body Mass Index: 25.07 kg/m      Reproductive/Obstetrics negative OB ROS                             Anesthesia Physical  Anesthesia Plan  ASA: 2  Anesthesia Plan: General   Post-op Pain Management: Minimal or no pain anticipated   Induction: Intravenous  PONV Risk Score and Plan: TIVA  Airway  Management Planned: Natural Airway and Mask  Additional Equipment:   Intra-op Plan:   Post-operative Plan:   Informed Consent: I have reviewed the patients History and Physical, chart, labs and discussed the procedure including the risks, benefits and alternatives for the proposed anesthesia with the patient or authorized representative who has indicated his/her understanding and acceptance.     Dental Advisory Given  Plan Discussed with: Anesthesiologist, CRNA and Surgeon  Anesthesia Plan Comments: (Patient consented for risks of anesthesia including but not limited to:  - adverse reactions to medications - risk of airway placement if required - damage to eyes, teeth, lips or other oral mucosa - nerve damage due to positioning  - sore throat or hoarseness - Damage to heart, brain, nerves, lungs, other parts of  body or loss of life  Patient voiced understanding.)        Anesthesia Quick Evaluation

## 2022-04-20 NOTE — H&P (Signed)
Karen [name] is an 45 y.o. female.   Chief Complaint: 46 year old woman with a history of multiple medical problems and a past history of severe depression with psychotic features that responded to ECT earlier this year has had a return of psychotic features.  Patient complains that what she refers to as "the brain machine" is telling her not to eat or to drink and is saying bad things about her HPI: History of prior psychotic depression  Past Medical History:  Diagnosis Date   Ehlers-Danlos syndrome    Fibromyalgia    Headache    Sleep apnea    Tics of organic origin    Transient alteration of awareness     Past Surgical History:  Procedure Laterality Date   COLONOSCOPY WITH PROPOFOL N/A 02/13/2018   Procedure: COLONOSCOPY WITH PROPOFOL;  Surgeon: [name], MD;  Location: [facility] ENDOSCOPY;  Service: Endoscopy;  Laterality: N/A;   COLONOSCOPY WITH PROPOFOL N/A 01/11/2022   Procedure: COLONOSCOPY WITH PROPOFOL;  Surgeon: [name], MD;  Location: [facility] ENDOSCOPY;  Service: Endoscopy;  Laterality: N/A;   DILATION AND CURETTAGE OF UTERUS  08/16/2007   ESOPHAGOGASTRODUODENOSCOPY (EGD) WITH PROPOFOL  02/13/2018   Procedure: ESOPHAGOGASTRODUODENOSCOPY (EGD) WITH PROPOFOL;  Surgeon: [name], MD;  Location: [place] ENDOSCOPY;  Service: Endoscopy;;   EYE SURGERY Left 08/15/1988   3 Surgeries on left eye to correct crossed eye   LAPAROSCOPY Left 08/15/2001   Fallopian Tube   MANDIBLE SURGERY  08/15/1996   OTHER SURGICAL HISTORY Right 08/15/1985   3 Surgeries to repair broken arm   OTHER SURGICAL HISTORY Left    3 surgeries on her left thigh as an infant   OTHER SURGICAL HISTORY  08/15/1996   Jaw surgery   Right arm surgery  08/15/1985   x 3 due to fracture   TOENAIL EXCISION Bilateral 06/13/2019   Procedure: BILATERAL SECOND TOE PARTIAL NAIL ABLATION;  Surgeon: Erle Crocker, MD;  Location: [place];  Service: Orthopedics;  Laterality: Bilateral;  SURGERY  REQUEST TIME 1 HOUR   TONSILLECTOMY  12/13/2002   TONSILLECTOMY      Family History  Problem Relation Age of Onset   Depression Mother    Stroke Mother    Fibromyalgia Mother    Osteoarthritis Mother    Asthma Brother    Depression Brother    Migraines Brother    Asperger's syndrome Brother    Other Brother        Ehlers-Danlos Syndrome   Cancer Maternal Aunt    Asperger's syndrome Maternal Aunt    Breast cancer Maternal Aunt    Heart disease Paternal Aunt    Heart disease Paternal Uncle    Cervical cancer Maternal Grandmother    Osteoporosis Maternal Grandmother        Died at 47   Osteoarthritis Maternal Grandmother    Colon cancer Maternal Grandfather    Asthma Maternal Grandfather    Asperger's syndrome Maternal Grandfather    Emphysema Maternal Grandfather        Died at 48   Prostate cancer Maternal Grandfather    Heart attack Paternal Grandfather        Died at 13   Asthma Paternal Grandfather    Tuberculosis Paternal Grandfather    Cancer Cousin    Asperger's syndrome Cousin    Asperger's syndrome Other    Heart Problems Paternal Grandmother        Died at 78   Ehlers-Danlos syndrome Father    Ehlers-Danlos syndrome  Brother    Social History:  reports that she has never smoked. She has never used smokeless tobacco. She reports that she does not drink alcohol and does not use drugs.  Allergies:  Allergies  Allergen Reactions   Augmentin  [Amoxicillin-Pot Clavulanate] Diarrhea   Chocolate Flavor     Other reaction(s): Other (See Comments) Wheezing, acne   Demerol [Meperidine] Other (See Comments)    Slow to wake up when this drug is given.    Keflex [Cephalexin] Nausea And Vomiting   Morphine And Related Nausea And Vomiting   Tape Dermatitis    Must use paper tape only   Toradol [Ketorolac Tromethamine] Nausea And Vomiting   Amoxicillin     Other reaction(s): Unknown   Chocolate     GI distress   Nsaids     patient develops ulcers Other  reaction(s): Other (See Comments) patient develops ulcers   Strawberry Extract     GI distress    (Not in a hospital admission)   No results found for this or any previous visit (from the past 48 hour(s)). No results found.  Review of Systems  Constitutional: Negative.   HENT: Negative.    Eyes: Negative.   Respiratory: Negative.    Cardiovascular: Negative.   Gastrointestinal: Negative.   Musculoskeletal:  Positive for myalgias.  Skin: Negative.   Neurological:  Positive for weakness.  Psychiatric/Behavioral:  Positive for confusion and hallucinations.   All other systems reviewed and are negative.   Blood pressure (!) 133/93, pulse 82, temperature 99 F (37.2 C), temperature source Oral, resp. rate 18, height 4' 11.5" (1.511 m), weight 55 kg, SpO2 99 %. Physical Exam Vitals reviewed.  Constitutional:      Appearance: She is well-developed.  HENT:     Head: Normocephalic and atraumatic.  Eyes:     Conjunctiva/sclera: Conjunctivae normal.     Pupils: Pupils are equal, round, and reactive to light.  Cardiovascular:     Heart sounds: Normal heart sounds.  Pulmonary:     Effort: Pulmonary effort is normal.  Abdominal:     Palpations: Abdomen is soft.  Musculoskeletal:        General: Normal range of motion.     Cervical back: Normal range of motion.  Skin:    General: Skin is warm and dry.  Neurological:     Mental Status: She is alert.     Motor: Weakness present.  Psychiatric:        Attention and Perception: Attention normal. She perceives auditory hallucinations.        Mood and Affect: Mood is depressed. Affect is blunt.        Behavior: Behavior is slowed.      Assessment/Plan Patient tolerated ECT well last time and showed improvement in symptoms.  Not currently seeing anyone for outpatient psychiatric follow-up which is certainly a problem.  We will try to see if we can get some improvement with this course of ECT and then may be discussed maintenance  treatment  Alethia Berthold, MD 04/20/2022, 7:02 PM

## 2022-04-20 NOTE — Telephone Encounter (Signed)
Copied from [place] [id number]. Topic: General - Other >> Apr 20, 2022 11:21 AM Everette C wrote: Reason for CRM: The patient's mother has called to request orders for urinalysis   Please contact the patient's mother further when possible

## 2022-04-20 NOTE — Transfer of Care (Signed)
Immediate Anesthesia Transfer of Care Note  Patient: Karen [name]  Procedure(s) Performed: ECT TX  Patient Location: PACU  Anesthesia Type:General  Level of Consciousness: drowsy  Airway & Oxygen Therapy: Patient Spontanous Breathing  Post-op Assessment: Report given to RN and Post -op Vital signs reviewed and stable  Post vital signs: Reviewed and stable  Last Vitals:  Vitals Value Taken Time  BP 100/84 04/20/22 1256  Temp    Pulse 97 04/20/22 1259  Resp 22 04/20/22 1259  SpO2 97 % 04/20/22 1259  Vitals shown include unvalidated device data.  Last Pain:  Vitals:   04/20/22 1205  TempSrc:   PainSc: 0-No pain         Complications: No notable events documented.

## 2022-04-20 NOTE — Procedures (Signed)
ECT SERVICES Physician's Interval Evaluation & Treatment Note  Patient Identification: Karen [name] MRN:  [id number] Date of Evaluation:  04/20/2022 TX #: 1 in the series  MADRS:   MMSE:   P.E. Findings:  Patient has Ehlers-Danlos syndrome chronic weakness and impairment but no specific new finding  Psychiatric Interval Note:  Having hallucinations depressed withdrawn and not eating or drinking  Subjective:  Patient is a 46 y.o. female seen for evaluation for Electroconvulsive Therapy. Endorses hallucinations and psychotic delusions  Treatment Summary:   '[]'$   Right Unilateral             '[x]'$  Bilateral   % Energy : 1.0 ms 40%   Impedance: 1570 ohms  Seizure Energy Index: 9182 V squared  Postictal Suppression Index: No reading  Seizure Concordance Index: 88%  Medications  Pre Shock: Labetalol 20 mg Brevital 60 mg succinylcholine 60 mg  Post Shock: Versed 2 mg  Seizure Duration: 49 seconds EMG, 130 seconds EEG   Comments: Good quality seizure but will need more succinylcholine next time due to excessive movement  Lungs:  '[x]'$   Clear to auscultation               '[]'$  Other:   Heart:    '[x]'$   Regular rhythm             '[]'$  irregular rhythm    '[x]'$   Previous H&P reviewed, patient examined and there are NO CHANGES                 '[]'$   Previous H&P reviewed, patient examined and there are changes noted.   Alethia Berthold, MD 9/6/20237:04 PM

## 2022-04-21 ENCOUNTER — Other Ambulatory Visit: Payer: Self-pay | Admitting: Psychiatry

## 2022-04-21 DIAGNOSIS — R200 Anesthesia of skin: Secondary | ICD-10-CM

## 2022-04-21 NOTE — Anesthesia Postprocedure Evaluation (Signed)
Anesthesia Post Note  Patient: Karen [name]  Procedure(s) Performed: ECT TX  Patient location during evaluation: PACU Anesthesia Type: General Level of consciousness: awake and alert Pain management: pain level controlled Vital Signs Assessment: post-procedure vital signs reviewed and stable Respiratory status: spontaneous breathing, nonlabored ventilation and respiratory function stable Cardiovascular status: blood pressure returned to baseline and stable Postop Assessment: no apparent nausea or vomiting Anesthetic complications: no   No notable events documented.   Last Vitals:  Vitals:   04/20/22 1330 04/20/22 1353  BP: (!) 123/92 (!) 133/93  Pulse: 78 82  Resp: 20 18  Temp:  37.2 C  SpO2: 95% 99%    Last Pain:  Vitals:   04/20/22 1353  TempSrc: Oral  PainSc:                  [name]

## 2022-04-22 ENCOUNTER — Ambulatory Visit
Admission: RE | Admit: 2022-04-22 | Discharge: 2022-04-22 | Disposition: A | Discharge status: Home / Self Care | Payer: Medicare Other | Source: Ambulatory Visit | Attending: Psychiatry | Admitting: Psychiatry

## 2022-04-22 ENCOUNTER — Encounter: Payer: Self-pay | Admitting: Anesthesiology

## 2022-04-22 ENCOUNTER — Other Ambulatory Visit: Payer: Self-pay | Admitting: Psychiatry

## 2022-04-22 DIAGNOSIS — R4182 Altered mental status, unspecified: Secondary | ICD-10-CM | POA: Insufficient documentation

## 2022-04-22 LAB — POCT URINALYSIS DIPSTICK
Bilirubin, UA: NEGATIVE
Blood, UA: NEGATIVE
Glucose, UA: NEGATIVE
Ketones, UA: NEGATIVE
Leukocytes, UA: NEGATIVE
Nitrite, UA: NEGATIVE
Protein, UA: NEGATIVE
Spec Grav, UA: <=1.005 — AB (ref 1.010–1.025)
Urobilinogen, UA: 0.2 E.U./dL
pH, UA: 6 (ref 5.0–8.0)

## 2022-04-22 NOTE — Telephone Encounter (Addendum)
Patient came by the office today requesting to have her urine checked  to see if her psychosis was coming from another UTI. Urine sample was collected. Please review results.

## 2022-04-22 NOTE — Progress Notes (Signed)
Patient stated she had a bowl of cereal. Rescheduled for Monday. MD notified.

## 2022-04-22 NOTE — Addendum Note (Signed)
Addended by: Randal Buba on: 04/22/2022 09:41 AM   Modules accepted: Orders

## 2022-04-25 ENCOUNTER — Encounter (HOSPITAL_BASED_OUTPATIENT_CLINIC_OR_DEPARTMENT_OTHER)
Admission: RE | Admit: 2022-04-25 | Discharge: 2022-04-25 | Disposition: A | Discharge status: Home / Self Care | Payer: Medicare Other | Source: Ambulatory Visit | Attending: Psychiatry | Admitting: Psychiatry

## 2022-04-25 ENCOUNTER — Encounter: Payer: Self-pay | Admitting: Anesthesiology

## 2022-04-25 ENCOUNTER — Ambulatory Visit: Payer: Self-pay | Admitting: Anesthesiology

## 2022-04-25 DIAGNOSIS — F333 Major depressive disorder, recurrent, severe with psychotic symptoms: Secondary | ICD-10-CM | POA: Diagnosis not present

## 2022-04-25 DIAGNOSIS — R200 Anesthesia of skin: Secondary | ICD-10-CM | POA: Diagnosis not present

## 2022-04-25 DIAGNOSIS — F331 Major depressive disorder, recurrent, moderate: Secondary | ICD-10-CM | POA: Diagnosis not present

## 2022-04-25 DIAGNOSIS — F323 Major depressive disorder, single episode, severe with psychotic features: Secondary | ICD-10-CM | POA: Diagnosis not present

## 2022-04-25 MED ORDER — LABETALOL HCL 5 MG/ML IV SOLN
INTRAVENOUS | Status: DC | PRN
  Administered 2022-04-25: 5 mg via INTRAVENOUS

## 2022-04-25 MED ORDER — MIDAZOLAM HCL 2 MG/2ML IJ SOLN
INTRAMUSCULAR | Status: DC | PRN
  Administered 2022-04-25: 2 mg via INTRAVENOUS

## 2022-04-25 MED ORDER — MIDAZOLAM HCL 2 MG/2ML IJ SOLN: 2.0000 mg | Freq: Once | INTRAMUSCULAR | Status: DC

## 2022-04-25 MED ORDER — GLYCOPYRROLATE 0.2 MG/ML IJ SOLN: 0.1000 mg | Freq: Once | INTRAMUSCULAR | Status: DC

## 2022-04-25 MED ORDER — METHOHEXITAL SODIUM 100 MG/10ML IV SOSY
PREFILLED_SYRINGE | INTRAVENOUS | Status: DC | PRN
  Administered 2022-04-25: 60 mg via INTRAVENOUS

## 2022-04-25 MED ORDER — LABETALOL HCL 5 MG/ML IV SOLN
INTRAVENOUS | Status: AC
  Filled 2022-04-25: qty 4

## 2022-04-25 MED ORDER — SUCCINYLCHOLINE CHLORIDE 200 MG/10ML IV SOSY
PREFILLED_SYRINGE | INTRAVENOUS | Status: DC | PRN
  Administered 2022-04-25: 60 mg via INTRAVENOUS

## 2022-04-25 MED ORDER — SODIUM CHLORIDE 0.9 % IV SOLN: 500.0000 mL | Freq: Once | INTRAVENOUS | Status: DC

## 2022-04-25 MED ORDER — MIDAZOLAM HCL 2 MG/2ML IJ SOLN
INTRAMUSCULAR | Status: AC
  Filled 2022-04-25: qty 2

## 2022-04-25 MED ORDER — LABETALOL HCL 5 MG/ML IV SOLN
INTRAVENOUS | Status: AC
  Filled 2022-04-25: qty 8

## 2022-04-25 MED ORDER — SODIUM CHLORIDE 0.9 % IV SOLN
500.0000 mL | Freq: Once | INTRAVENOUS | Status: AC
Start: 2022-04-25 — End: 2022-04-25

## 2022-04-25 MED ORDER — SUCCINYLCHOLINE CHLORIDE 200 MG/10ML IV SOSY
PREFILLED_SYRINGE | INTRAVENOUS | Status: AC
  Filled 2022-04-25: qty 40

## 2022-04-25 NOTE — Transfer of Care (Signed)
Immediate Anesthesia Transfer of Care Note  Patient: Karen [name]  Procedure(s) Performed: ECT TX  Patient Location: PACU  Anesthesia Type:General  Level of Consciousness: drowsy  Airway & Oxygen Therapy: Patient Spontanous Breathing and Patient connected to face mask oxygen  Post-op Assessment: Report given to RN and Post -op Vital signs reviewed and stable  Post vital signs: Reviewed and stable  Last Vitals:  Vitals Value Taken Time  BP 112/68 04/25/22 1326  Temp    Pulse 81 04/25/22 1327  Resp 18 04/25/22 1327  SpO2 99 % 04/25/22 1327  Vitals shown include unvalidated device data.  Last Pain:  Vitals:   04/25/22 1025  TempSrc:   PainSc: 0-No pain         Complications: No notable events documented.

## 2022-04-25 NOTE — Anesthesia Procedure Notes (Signed)
Procedure Name: General with mask airway Date/Time: 04/25/2022 1:10 PM  Performed by: Lily Peer, Judie Hollick, CRNAPre-anesthesia Checklist: Patient identified, Emergency Drugs available, Suction available and Patient being monitored Patient Re-evaluated:Patient Re-evaluated prior to induction Oxygen Delivery Method: Circle system utilized Preoxygenation: Pre-oxygenation with 100% oxygen Induction Type: IV induction Ventilation: Mask ventilation without difficulty

## 2022-04-25 NOTE — H&P (Signed)
Karen [name] is an 46 y.o. female.   Chief Complaint: Still depressed with hallucinations HPI: Recurrent psychotic depression  Past Medical History:  Diagnosis Date   Ehlers-Danlos syndrome    Fibromyalgia    Headache    Sleep apnea    Tics of organic origin    Transient alteration of awareness     Past Surgical History:  Procedure Laterality Date   COLONOSCOPY WITH PROPOFOL N/A 02/13/2018   Procedure: COLONOSCOPY WITH PROPOFOL;  Surgeon: [name], MD;  Location: [facility] ENDOSCOPY;  Service: Endoscopy;  Laterality: N/A;   COLONOSCOPY WITH PROPOFOL N/A 01/11/2022   Procedure: COLONOSCOPY WITH PROPOFOL;  Surgeon: [name], MD;  Location: [facility] ENDOSCOPY;  Service: Endoscopy;  Laterality: N/A;   DILATION AND CURETTAGE OF UTERUS  08/16/2007   ESOPHAGOGASTRODUODENOSCOPY (EGD) WITH PROPOFOL  02/13/2018   Procedure: ESOPHAGOGASTRODUODENOSCOPY (EGD) WITH PROPOFOL;  Surgeon: [name], MD;  Location: [place] ENDOSCOPY;  Service: Endoscopy;;   EYE SURGERY Left 08/15/1988   3 Surgeries on left eye to correct crossed eye   LAPAROSCOPY Left 08/15/2001   Fallopian Tube   MANDIBLE SURGERY  08/15/1996   OTHER SURGICAL HISTORY Right 08/15/1985   3 Surgeries to repair broken arm   OTHER SURGICAL HISTORY Left    3 surgeries on her left thigh as an infant   OTHER SURGICAL HISTORY  08/15/1996   Jaw surgery   Right arm surgery  08/15/1985   x 3 due to fracture   TOENAIL EXCISION Bilateral 06/13/2019   Procedure: BILATERAL SECOND TOE PARTIAL NAIL ABLATION;  Surgeon: Erle Crocker, MD;  Location: [place];  Service: Orthopedics;  Laterality: Bilateral;  SURGERY REQUEST TIME 1 HOUR   TONSILLECTOMY  12/13/2002   TONSILLECTOMY      Family History  Problem Relation Age of Onset   Depression Mother    Stroke Mother    Fibromyalgia Mother    Osteoarthritis Mother    Asthma Brother    Depression Brother    Migraines Brother    Asperger's syndrome Brother    Other  Brother        Ehlers-Danlos Syndrome   Cancer Maternal Aunt    Asperger's syndrome Maternal Aunt    Breast cancer Maternal Aunt    Heart disease Paternal Aunt    Heart disease Paternal Uncle    Cervical cancer Maternal Grandmother    Osteoporosis Maternal Grandmother        Died at 72   Osteoarthritis Maternal Grandmother    Colon cancer Maternal Grandfather    Asthma Maternal Grandfather    Asperger's syndrome Maternal Grandfather    Emphysema Maternal Grandfather        Died at 76   Prostate cancer Maternal Grandfather    Heart attack Paternal Grandfather        Died at 61   Asthma Paternal Grandfather    Tuberculosis Paternal Grandfather    Cancer Cousin    Asperger's syndrome Cousin    Asperger's syndrome Other    Heart Problems Paternal Grandmother        Died at 26   Ehlers-Danlos syndrome Father    Ehlers-Danlos syndrome Brother    Social History:  reports that she has never smoked. She has never used smokeless tobacco. She reports that she does not drink alcohol and does not use drugs.  Allergies:  Allergies  Allergen Reactions   Augmentin  [Amoxicillin-Pot Clavulanate] Diarrhea   Chocolate Flavor     Other reaction(s): Other (See Comments)  Wheezing, acne   Demerol [Meperidine] Other (See Comments)    Slow to wake up when this drug is given.    Keflex [Cephalexin] Nausea And Vomiting   Morphine And Related Nausea And Vomiting   Tape Dermatitis    Must use paper tape only   Toradol [Ketorolac Tromethamine] Nausea And Vomiting   Amoxicillin     Other reaction(s): Unknown   Chocolate     GI distress   Nsaids     patient develops ulcers Other reaction(s): Other (See Comments) patient develops ulcers   Strawberry Extract     GI distress    (Not in a hospital admission)   No results found for this or any previous visit (from the past 48 hour(s)). No results found.  Review of Systems  Constitutional: Negative.   HENT: Negative.    Eyes: Negative.    Respiratory: Negative.    Cardiovascular: Negative.   Gastrointestinal: Negative.   Musculoskeletal: Negative.   Skin: Negative.   Neurological: Negative.   Psychiatric/Behavioral: Negative.      Blood pressure 99/68, pulse 72, temperature 98.2 F (36.8 C), resp. rate 19, height '4\' 11"'$  (1.499 m), weight 55 kg, SpO2 95 %. Physical Exam Vitals reviewed.  Constitutional:      Appearance: She is well-developed.  HENT:     Head: Normocephalic and atraumatic.  Eyes:     Conjunctiva/sclera: Conjunctivae normal.     Pupils: Pupils are equal, round, and reactive to light.  Cardiovascular:     Heart sounds: Normal heart sounds.  Pulmonary:     Effort: Pulmonary effort is normal.  Abdominal:     Palpations: Abdomen is soft.  Musculoskeletal:        General: Normal range of motion.     Cervical back: Normal range of motion.  Skin:    General: Skin is warm and dry.  Neurological:     Mental Status: She is alert.  Psychiatric:        Mood and Affect: Mood normal.      Assessment/Plan Continue ECT  Alethia Berthold, MD 04/25/2022, 5:06 PM

## 2022-04-25 NOTE — Procedures (Signed)
ECT SERVICES Physician's Interval Evaluation & Treatment Note  Patient Identification: Karen [name] MRN:  [id number] Date of Evaluation:  04/25/2022 TX #: 2  MADRS:   MMSE:   P.E. Findings:  No change to physical exam  Psychiatric Interval Note:  Still hearing voices  Subjective:  Patient is a 46 y.o. female seen for evaluation for Electroconvulsive Therapy. Less anxious  Treatment Summary:   '[]'$   Right Unilateral             '[x]'$  Bilateral   % Energy : 1.0 ms 40%   Impedance: 1970 ohms  Seizure Energy Index: 6981 V squared  Postictal Suppression Index: 49%  Seizure Concordance Index: 96%  Medications  Pre Shock: Labetalol 20 mg Brevital 60 mg succinylcholine 60 mg  Post Shock: Versed 2 mg  Seizure Duration: 38 seconds EMG 82 seconds EEG   Comments: Follow-up Wednesday  Lungs:  '[x]'$   Clear to auscultation               '[]'$  Other:   Heart:    '[x]'$   Regular rhythm             '[]'$  irregular rhythm    '[x]'$   Previous H&P reviewed, patient examined and there are NO CHANGES                 '[]'$   Previous H&P reviewed, patient examined and there are changes noted.   Karen Berthold, MD 9/11/20235:04 PM

## 2022-04-25 NOTE — Anesthesia Preprocedure Evaluation (Signed)
Anesthesia Evaluation  Patient identified by MRN, date of birth, ID band Patient awake    Reviewed: Allergy & Precautions, NPO status , Patient's Chart, lab work & pertinent test results  History of Anesthesia Complications Negative for: history of anesthetic complications  Airway Mallampati: II  TM Distance: <3 FB Neck ROM: full    Dental  (+) Poor Dentition, Missing   Pulmonary asthma , sleep apnea and Continuous Positive Airway Pressure Ventilation ,    Pulmonary exam normal        Cardiovascular Exercise Tolerance: Poor (-) anginaNormal cardiovascular exam+ Valvular Problems/Murmurs MVP      Neuro/Psych  Headaches, Seizures -, Well Controlled,  PSYCHIATRIC DISORDERS  Neuromuscular disease    GI/Hepatic negative GI ROS, Neg liver ROS, neg GERD  ,  Endo/Other  negative endocrine ROS  Renal/GU negative Renal ROS  negative genitourinary   Musculoskeletal  (+) Arthritis , Fibromyalgia -  Abdominal Normal abdominal exam  (+)   Peds  Hematology negative hematology ROS (+)   Anesthesia Other Findings Past Medical History: No date: Ehlers-Danlos syndrome No date: Fibromyalgia No date: Headache No date: Sleep apnea No date: Tics of organic origin No date: Transient alteration of awareness  Past Surgical History: 02/13/2018: COLONOSCOPY WITH PROPOFOL; N/A     Comment:  Procedure: COLONOSCOPY WITH PROPOFOL;  Surgeon: [name], MD;  Location: [facility] ENDOSCOPY;  Service:               Endoscopy;  Laterality: N/A; 08/16/2007: DILATION AND CURETTAGE OF UTERUS 02/13/2018: ESOPHAGOGASTRODUODENOSCOPY (EGD) WITH PROPOFOL     Comment:  Procedure: ESOPHAGOGASTRODUODENOSCOPY (EGD) WITH               PROPOFOL;  Surgeon: [name], MD;  Location: [facility]               ENDOSCOPY;  Service: Endoscopy;; 08/15/1988: EYE SURGERY; Left     Comment:  3 Surgeries on left eye to correct crossed eye 08/15/2001:  LAPAROSCOPY; Left     Comment:  Fallopian Tube 08/15/1996: MANDIBLE SURGERY 08/15/1985: OTHER SURGICAL HISTORY; Right     Comment:  3 Surgeries to repair broken arm No date: OTHER SURGICAL HISTORY; Left     Comment:  3 surgeries on her left thigh as an infant 08/15/1996: OTHER SURGICAL HISTORY     Comment:  Jaw surgery 08/15/1985: Right arm surgery     Comment:  x 3 due to fracture 06/13/2019: TOENAIL EXCISION; Bilateral     Comment:  Procedure: BILATERAL SECOND TOE PARTIAL NAIL ABLATION;                Surgeon: Erle Crocker, MD;  Location: [place];  Service: Orthopedics;  Laterality:               Bilateral;  SURGERY REQUEST TIME 1 HOUR 12/13/2002: TONSILLECTOMY No date: TONSILLECTOMY  BMI    Body Mass Index: 25.07 kg/m      Reproductive/Obstetrics negative OB ROS                             Anesthesia Physical  Anesthesia Plan  ASA: 2  Anesthesia Plan: General   Post-op Pain Management: Minimal or no pain anticipated   Induction: Intravenous  PONV Risk Score and Plan: TIVA  Airway  Management Planned: Natural Airway and Mask  Additional Equipment:   Intra-op Plan:   Post-operative Plan:   Informed Consent: I have reviewed the patients History and Physical, chart, labs and discussed the procedure including the risks, benefits and alternatives for the proposed anesthesia with the patient or authorized representative who has indicated his/her understanding and acceptance.     Dental Advisory Given  Plan Discussed with: Anesthesiologist, CRNA and Surgeon  Anesthesia Plan Comments: (Patient consented for risks of anesthesia including but not limited to:  - adverse reactions to medications - risk of airway placement if required - damage to eyes, teeth, lips or other oral mucosa - nerve damage due to positioning  - sore throat or hoarseness - Damage to heart, brain, nerves, lungs, other parts of  body or loss of life  Patient voiced understanding.)        Anesthesia Quick Evaluation

## 2022-04-26 ENCOUNTER — Other Ambulatory Visit: Payer: Self-pay | Admitting: Psychiatry

## 2022-04-26 DIAGNOSIS — R200 Anesthesia of skin: Secondary | ICD-10-CM

## 2022-04-26 NOTE — Anesthesia Postprocedure Evaluation (Signed)
Anesthesia Post Note  Patient: Karen [name]  Procedure(s) Performed: ECT TX  Patient location during evaluation: PACU Anesthesia Type: General Level of consciousness: awake and alert Pain management: pain level controlled Vital Signs Assessment: post-procedure vital signs reviewed and stable Respiratory status: spontaneous breathing, nonlabored ventilation and respiratory function stable Cardiovascular status: blood pressure returned to baseline and stable Postop Assessment: no apparent nausea or vomiting Anesthetic complications: no   No notable events documented.   Last Vitals:  Vitals:   04/25/22 1440 04/25/22 1444  BP: 99/68   Pulse: 71 72  Resp: (!) 21 19  Temp: 36.8 C   SpO2: 96% 95%    Last Pain:  Vitals:   04/25/22 1440  TempSrc:   PainSc: 0-No pain                 [name]

## 2022-04-27 ENCOUNTER — Encounter: Payer: Self-pay | Admitting: Certified Registered Nurse Anesthetist

## 2022-04-27 ENCOUNTER — Encounter (HOSPITAL_BASED_OUTPATIENT_CLINIC_OR_DEPARTMENT_OTHER)
Admission: RE | Admit: 2022-04-27 | Discharge: 2022-04-27 | Disposition: A | Discharge status: Home / Self Care | Payer: Medicare Other | Source: Ambulatory Visit | Attending: Psychiatry | Admitting: Psychiatry

## 2022-04-27 DIAGNOSIS — G4730 Sleep apnea, unspecified: Secondary | ICD-10-CM | POA: Diagnosis not present

## 2022-04-27 DIAGNOSIS — F333 Major depressive disorder, recurrent, severe with psychotic symptoms: Secondary | ICD-10-CM

## 2022-04-27 DIAGNOSIS — F418 Other specified anxiety disorders: Secondary | ICD-10-CM | POA: Diagnosis not present

## 2022-04-27 DIAGNOSIS — F323 Major depressive disorder, single episode, severe with psychotic features: Secondary | ICD-10-CM | POA: Diagnosis not present

## 2022-04-27 DIAGNOSIS — I341 Nonrheumatic mitral (valve) prolapse: Secondary | ICD-10-CM | POA: Diagnosis not present

## 2022-04-27 DIAGNOSIS — R200 Anesthesia of skin: Secondary | ICD-10-CM | POA: Diagnosis not present

## 2022-04-27 DIAGNOSIS — M1990 Unspecified osteoarthritis, unspecified site: Secondary | ICD-10-CM | POA: Diagnosis not present

## 2022-04-27 MED ORDER — ACETAMINOPHEN 325 MG PO TABS: 650.0000 mg | ORAL_TABLET | Freq: Once | ORAL | Status: DC | PRN

## 2022-04-27 MED ORDER — ACETAMINOPHEN 160 MG/5ML PO SOLN
325.0000 mg | ORAL | Status: DC | PRN
  Filled 2022-04-27: qty 20.3

## 2022-04-27 MED ORDER — METHOHEXITAL SODIUM 100 MG/10ML IV SOSY
PREFILLED_SYRINGE | INTRAVENOUS | Status: DC | PRN
  Administered 2022-04-27: 60 mg via INTRAVENOUS

## 2022-04-27 MED ORDER — SUCCINYLCHOLINE CHLORIDE 200 MG/10ML IV SOSY
PREFILLED_SYRINGE | INTRAVENOUS | Status: DC | PRN
  Administered 2022-04-27: 60 mg via INTRAVENOUS

## 2022-04-27 MED ORDER — MIDAZOLAM HCL 2 MG/2ML IJ SOLN
INTRAMUSCULAR | Status: AC
  Filled 2022-04-27: qty 2

## 2022-04-27 MED ORDER — ONDANSETRON HCL 4 MG/2ML IJ SOLN: 4.0000 mg | Freq: Once | INTRAMUSCULAR | Status: DC | PRN

## 2022-04-27 MED ORDER — LABETALOL HCL 5 MG/ML IV SOLN
INTRAVENOUS | Status: DC | PRN
  Administered 2022-04-27: 5 mg via INTRAVENOUS

## 2022-04-27 MED ORDER — MIDAZOLAM HCL 2 MG/2ML IJ SOLN
2.0000 mg | Freq: Once | INTRAMUSCULAR | Status: AC
  Administered 2022-04-27: 2 mg via INTRAVENOUS

## 2022-04-27 MED ORDER — SODIUM CHLORIDE 0.9 % IV SOLN
500.0000 mL | Freq: Once | INTRAVENOUS | Status: AC
  Administered 2022-04-27: 500 mL via INTRAVENOUS

## 2022-04-27 NOTE — H&P (Signed)
Karen [name] is an 46 y.o. female.   Chief Complaint: Continues to have some auditory hallucinations.  Mood is improved. HPI: History of recurrent psychotic depression  Past Medical History:  Diagnosis Date   Ehlers-Danlos syndrome    Fibromyalgia    Headache    Sleep apnea    Tics of organic origin    Transient alteration of awareness     Past Surgical History:  Procedure Laterality Date   COLONOSCOPY WITH PROPOFOL N/A 02/13/2018   Procedure: COLONOSCOPY WITH PROPOFOL;  Surgeon: [name], MD;  Location: [facility] ENDOSCOPY;  Service: Endoscopy;  Laterality: N/A;   COLONOSCOPY WITH PROPOFOL N/A 01/11/2022   Procedure: COLONOSCOPY WITH PROPOFOL;  Surgeon: [name], MD;  Location: [facility] ENDOSCOPY;  Service: Endoscopy;  Laterality: N/A;   DILATION AND CURETTAGE OF UTERUS  08/16/2007   ESOPHAGOGASTRODUODENOSCOPY (EGD) WITH PROPOFOL  02/13/2018   Procedure: ESOPHAGOGASTRODUODENOSCOPY (EGD) WITH PROPOFOL;  Surgeon: [name], MD;  Location: [place] ENDOSCOPY;  Service: Endoscopy;;   EYE SURGERY Left 08/15/1988   3 Surgeries on left eye to correct crossed eye   LAPAROSCOPY Left 08/15/2001   Fallopian Tube   MANDIBLE SURGERY  08/15/1996   OTHER SURGICAL HISTORY Right 08/15/1985   3 Surgeries to repair broken arm   OTHER SURGICAL HISTORY Left    3 surgeries on her left thigh as an infant   OTHER SURGICAL HISTORY  08/15/1996   Jaw surgery   Right arm surgery  08/15/1985   x 3 due to fracture   TOENAIL EXCISION Bilateral 06/13/2019   Procedure: BILATERAL SECOND TOE PARTIAL NAIL ABLATION;  Surgeon: Erle Crocker, MD;  Location: [place];  Service: Orthopedics;  Laterality: Bilateral;  SURGERY REQUEST TIME 1 HOUR   TONSILLECTOMY  12/13/2002   TONSILLECTOMY      Family History  Problem Relation Age of Onset   Depression Mother    Stroke Mother    Fibromyalgia Mother    Osteoarthritis Mother    Asthma Brother    Depression Brother    Migraines Brother     Asperger's syndrome Brother    Other Brother        Ehlers-Danlos Syndrome   Cancer Maternal Aunt    Asperger's syndrome Maternal Aunt    Breast cancer Maternal Aunt    Heart disease Paternal Aunt    Heart disease Paternal Uncle    Cervical cancer Maternal Grandmother    Osteoporosis Maternal Grandmother        Died at 48   Osteoarthritis Maternal Grandmother    Colon cancer Maternal Grandfather    Asthma Maternal Grandfather    Asperger's syndrome Maternal Grandfather    Emphysema Maternal Grandfather        Died at 50   Prostate cancer Maternal Grandfather    Heart attack Paternal Grandfather        Died at 47   Asthma Paternal Grandfather    Tuberculosis Paternal Grandfather    Cancer Cousin    Asperger's syndrome Cousin    Asperger's syndrome Other    Heart Problems Paternal Grandmother        Died at 58   Ehlers-Danlos syndrome Father    Ehlers-Danlos syndrome Brother    Social History:  reports that she has never smoked. She has never used smokeless tobacco. She reports that she does not drink alcohol and does not use drugs.  Allergies:  Allergies  Allergen Reactions   Augmentin  [Amoxicillin-Pot Clavulanate] Diarrhea   Chocolate Flavor  Other reaction(s): Other (See Comments) Wheezing, acne   Demerol [Meperidine] Other (See Comments)    Slow to wake up when this drug is given.    Keflex [Cephalexin] Nausea And Vomiting   Morphine And Related Nausea And Vomiting   Tape Dermatitis    Must use paper tape only   Toradol [Ketorolac Tromethamine] Nausea And Vomiting   Amoxicillin     Other reaction(s): Unknown   Chocolate     GI distress   Nsaids     patient develops ulcers Other reaction(s): Other (See Comments) patient develops ulcers   Strawberry Extract     GI distress    (Not in a hospital admission)   No results found for this or any previous visit (from the past 48 hour(s)). No results found.  Review of Systems  Constitutional: Negative.    HENT: Negative.    Eyes: Negative.   Respiratory: Negative.    Cardiovascular: Negative.   Gastrointestinal: Negative.   Musculoskeletal: Negative.   Skin: Negative.   Neurological: Negative.   Psychiatric/Behavioral:  Positive for hallucinations.     Blood pressure 111/78, pulse 85, temperature 97.7 F (36.5 C), temperature source Temporal, resp. rate 17, height '4\' 11"'$  (1.499 m), weight 55 kg, SpO2 100 %. Physical Exam Vitals and nursing note reviewed.  Constitutional:      Appearance: She is well-developed.  HENT:     Head: Normocephalic and atraumatic.  Eyes:     Conjunctiva/sclera: Conjunctivae normal.     Pupils: Pupils are equal, round, and reactive to light.  Cardiovascular:     Heart sounds: Normal heart sounds.  Pulmonary:     Effort: Pulmonary effort is normal.  Abdominal:     Palpations: Abdomen is soft.  Musculoskeletal:        General: Normal range of motion.     Cervical back: Normal range of motion.  Skin:    General: Skin is warm and dry.  Neurological:     General: No focal deficit present.     Mental Status: She is alert.  Psychiatric:        Mood and Affect: Mood normal.      Assessment/Plan ECT today and continue index course  Alethia Berthold, MD 04/27/2022, 4:21 PM

## 2022-04-27 NOTE — Anesthesia Procedure Notes (Signed)
Date/Time: 04/27/2022 12:26 PM  Performed by: Demetrius Charity, CRNAPre-anesthesia Checklist: Patient identified, Patient being monitored, Timeout performed, Emergency Drugs available and Suction available Patient Re-evaluated:Patient Re-evaluated prior to induction Oxygen Delivery Method: Circle system utilized Preoxygenation: Pre-oxygenation with 100% oxygen Ventilation: Mask ventilation without difficulty Airway Equipment and Method: Bite block Dental Injury: Teeth and Oropharynx as per pre-operative assessment

## 2022-04-27 NOTE — Procedures (Signed)
ECT SERVICES Physician's Interval Evaluation & Treatment Note  Patient Identification: Karen [name] MRN:  [id number] Date of Evaluation:  04/27/2022 TX #: 3  MADRS:   MMSE:   P.E. Findings:  No change to physical exam  Psychiatric Interval Note:  Still having some auditory hallucinations but calmer and less anxious  Subjective:  Patient is a 46 y.o. female seen for evaluation for Electroconvulsive Therapy. Feeling a little better no specific complaint  Treatment Summary:   '[]'$   Right Unilateral             '[x]'$  Bilateral   % Energy : 1.0 ms 40%   Impedance: 2520 ohms  Seizure Energy Index: 5012 V squared  Postictal Suppression Index: 14%  Seizure Concordance Index: 95%  Medications  Pre Shock: Labetalol 20 mg Brevital 60 mg succinylcholine 60 mg  Post Shock: Versed 2 mg  Seizure Duration: 40 seconds EMG 65 seconds EEG   Comments: Well-tolerated.  Next treatment Friday  Lungs:  '[x]'$   Clear to auscultation               '[]'$  Other:   Heart:    '[x]'$   Regular rhythm             '[]'$  irregular rhythm    '[x]'$   Previous H&P reviewed, patient examined and there are NO CHANGES                 '[]'$   Previous H&P reviewed, patient examined and there are changes noted.   Alethia Berthold, MD 9/13/20235:47 PM

## 2022-04-27 NOTE — Anesthesia Preprocedure Evaluation (Addendum)
Anesthesia Evaluation  Patient identified by MRN, date of birth, ID band Patient awake    Reviewed: Allergy & Precautions, NPO status , Patient's Chart, lab work & pertinent test results  History of Anesthesia Complications Negative for: history of anesthetic complications  Airway Mallampati: IV   Neck ROM: Full    Dental no notable dental hx.    Pulmonary asthma , sleep apnea and Continuous Positive Airway Pressure Ventilation ,    Pulmonary exam normal breath sounds clear to auscultation       Cardiovascular Normal cardiovascular exam+ Valvular Problems/Murmurs MVP  Rhythm:Regular Rate:Normal  ECG 10/05/21: Sinus rhythm Borderline T abnormalities, anterior leads   Neuro/Psych  Headaches, Seizures -,  Depression Chronic back pain    GI/Hepatic negative GI ROS,   Endo/Other  negative endocrine ROS  Renal/GU negative Renal ROS     Musculoskeletal  (+) Arthritis , Fibromyalgia -Ehlers-Danlos syndrome   Abdominal   Peds  Hematology negative hematology ROS (+)   Anesthesia Other Findings   Reproductive/Obstetrics                            Anesthesia Physical Anesthesia Plan  ASA: 3  Anesthesia Plan: General   Post-op Pain Management:    Induction: Intravenous  PONV Risk Score and Plan: 3 and TIVA and Treatment may vary due to age or medical condition  Airway Management Planned: Natural Airway  Additional Equipment:   Intra-op Plan:   Post-operative Plan:   Informed Consent: I have reviewed the patients History and Physical, chart, labs and discussed the procedure including the risks, benefits and alternatives for the proposed anesthesia with the patient or authorized representative who has indicated his/her understanding and acceptance.       Plan Discussed with: CRNA  Anesthesia Plan Comments: (Serial consent on chart.)        Anesthesia Quick Evaluation

## 2022-04-27 NOTE — Anesthesia Postprocedure Evaluation (Signed)
Anesthesia Post Note  Patient: AZIE MCCONAHY  Procedure(s) Performed: ECT TX  Patient location during evaluation: PACU Anesthesia Type: General Level of consciousness: awake and alert, oriented and patient cooperative Pain management: pain level controlled Vital Signs Assessment: post-procedure vital signs reviewed and stable Respiratory status: spontaneous breathing, nonlabored ventilation and respiratory function stable Cardiovascular status: blood pressure returned to baseline and stable Postop Assessment: adequate PO intake Anesthetic complications: no   No notable events documented.   Last Vitals:  Vitals:   04/27/22 1306 04/27/22 1323  BP: 117/60 108/67  Pulse: 83 74  Resp: 12 15  Temp: 36.8 C   SpO2: 100% 96%    Last Pain:  Vitals:   04/27/22 1306  TempSrc:   PainSc: 0-No pain                 Darrin Nipper

## 2022-04-27 NOTE — Transfer of Care (Signed)
Immediate Anesthesia Transfer of Care Note  Patient: Karen [name]  Procedure(s) Performed: ECT TX  Patient Location: PACU  Anesthesia Type:General  Level of Consciousness: awake  Airway & Oxygen Therapy: Patient Spontanous Breathing and Patient connected to face mask oxygen  Post-op Assessment: Report given to RN and Post -op Vital signs reviewed and stable  Post vital signs: Reviewed and stable  Last Vitals:  Vitals Value Taken Time  BP 115/70 04/27/22 1238  Temp    Pulse 80 04/27/22 1239  Resp 13 04/27/22 1239  SpO2 99 % 04/27/22 1239  Vitals shown include unvalidated device data.  Last Pain:  Vitals:   04/27/22 1006  TempSrc:   PainSc: 0-No pain         Complications: No notable events documented.

## 2022-04-29 ENCOUNTER — Ambulatory Visit
Admission: RE | Admit: 2022-04-29 | Discharge: 2022-04-29 | Disposition: A | Discharge status: Home / Self Care | Payer: Medicare Other | Source: Ambulatory Visit | Attending: Psychiatry | Admitting: Psychiatry

## 2022-04-29 ENCOUNTER — Encounter: Payer: Self-pay | Admitting: Anesthesiology

## 2022-04-29 ENCOUNTER — Ambulatory Visit: Payer: Self-pay | Admitting: Anesthesiology

## 2022-04-29 ENCOUNTER — Other Ambulatory Visit: Payer: Self-pay | Admitting: Psychiatry

## 2022-04-29 DIAGNOSIS — F418 Other specified anxiety disorders: Secondary | ICD-10-CM | POA: Diagnosis not present

## 2022-04-29 DIAGNOSIS — G4730 Sleep apnea, unspecified: Secondary | ICD-10-CM | POA: Diagnosis not present

## 2022-04-29 DIAGNOSIS — M1990 Unspecified osteoarthritis, unspecified site: Secondary | ICD-10-CM | POA: Diagnosis not present

## 2022-04-29 DIAGNOSIS — I341 Nonrheumatic mitral (valve) prolapse: Secondary | ICD-10-CM | POA: Diagnosis not present

## 2022-04-29 DIAGNOSIS — R200 Anesthesia of skin: Secondary | ICD-10-CM

## 2022-04-29 DIAGNOSIS — F333 Major depressive disorder, recurrent, severe with psychotic symptoms: Secondary | ICD-10-CM | POA: Insufficient documentation

## 2022-04-29 MED ORDER — LABETALOL HCL 5 MG/ML IV SOLN
INTRAVENOUS | Status: DC | PRN
  Administered 2022-04-29: 5 mg via INTRAVENOUS

## 2022-04-29 MED ORDER — SUCCINYLCHOLINE CHLORIDE 200 MG/10ML IV SOSY
PREFILLED_SYRINGE | INTRAVENOUS | Status: DC | PRN
  Administered 2022-04-29: 60 mg via INTRAVENOUS

## 2022-04-29 MED ORDER — SODIUM CHLORIDE 0.9 % IV SOLN: 500.0000 mL | Freq: Once | INTRAVENOUS | Status: AC

## 2022-04-29 MED ORDER — METHOHEXITAL SODIUM 100 MG/10ML IV SOSY
PREFILLED_SYRINGE | INTRAVENOUS | Status: DC | PRN
  Administered 2022-04-29: 60 mg via INTRAVENOUS

## 2022-04-29 MED ORDER — MIDAZOLAM HCL 2 MG/2ML IJ SOLN
INTRAMUSCULAR | Status: AC
  Filled 2022-04-29: qty 2

## 2022-04-29 MED ORDER — METHOHEXITAL SODIUM 0.5 G IJ SOLR
INTRAMUSCULAR | Status: AC
  Filled 2022-04-29: qty 500

## 2022-04-29 MED ORDER — MIDAZOLAM HCL 2 MG/2ML IJ SOLN
INTRAMUSCULAR | Status: DC | PRN
  Administered 2022-04-29: 2 mg via INTRAVENOUS

## 2022-04-29 NOTE — Anesthesia Postprocedure Evaluation (Signed)
Anesthesia Post Note  Patient: Karen [name]  Procedure(s) Performed: ECT TX  Anesthesia Type: General Anesthetic complications: no   No notable events documented.   Last Vitals:  Vitals:   04/29/22 1305 04/29/22 1318  BP: 115/80 118/82  Pulse: 99 98  Resp: 17 18  Temp: (!) 36.2 C (!) 36.3 C  SpO2: 94%     Last Pain:  Vitals:   04/29/22 1318  TempSrc: Oral  PainSc: 0-No pain                 VAN STAVEREN,Gunner Iodice

## 2022-04-29 NOTE — Procedures (Signed)
ECT SERVICES Physician's Interval Evaluation & Treatment Note  Patient Identification: Karen [name] MRN:  [id number] Date of Evaluation:  04/29/2022 TX #: 4  MADRS:   MMSE:   P.E. Findings:  No change to physical exam  Psychiatric Interval Note:  Relaxed smiling more still reports hallucinations but much less disturbed by them and much less frequently  Subjective:  Patient is a 46 y.o. female seen for evaluation for Electroconvulsive Therapy. See above  Treatment Summary:   '[]'$   Right Unilateral             '[x]'$  Bilateral   % Energy : 1.0 ms 40%   Impedance: 1470 ohms  Seizure Energy Index: 5524 V squared  Postictal Suppression Index: 59%  Seizure Concordance Index: 78%  Medications  Pre Shock: Labetalol 20 mg Brevital 60 mg succinylcholine 60 mg  Post Shock: Versed 2 mg  Seizure Duration: EMG 54 seconds EEG 91 seconds   Comments: We would like to follow-up Monday and then we can reevaluate the need for further treatment  Lungs:  '[x]'$   Clear to auscultation               '[]'$  Other:   Heart:    '[x]'$   Regular rhythm             '[]'$  irregular rhythm    '[x]'$   Previous H&P reviewed, patient examined and there are NO CHANGES                 '[]'$   Previous H&P reviewed, patient examined and there are changes noted.   Alethia Berthold, MD 9/15/20232:23 PM

## 2022-04-29 NOTE — Anesthesia Preprocedure Evaluation (Signed)
Anesthesia Evaluation  Patient identified by MRN, date of birth, ID band Patient awake    Reviewed: Allergy & Precautions, NPO status , Patient's Chart, lab work & pertinent test results  History of Anesthesia Complications Negative for: history of anesthetic complications  Airway Mallampati: IV   Neck ROM: Full    Dental no notable dental hx.    Pulmonary asthma , sleep apnea and Continuous Positive Airway Pressure Ventilation ,    Pulmonary exam normal breath sounds clear to auscultation       Cardiovascular Normal cardiovascular exam+ Valvular Problems/Murmurs MVP  Rhythm:Regular Rate:Normal  ECG 10/05/21: Sinus rhythm Borderline T abnormalities, anterior leads   Neuro/Psych  Headaches, Seizures -,  Depression Chronic back pain    GI/Hepatic negative GI ROS,   Endo/Other  negative endocrine ROS  Renal/GU negative Renal ROS     Musculoskeletal  (+) Arthritis , Fibromyalgia -Ehlers-Danlos syndrome   Abdominal   Peds  Hematology negative hematology ROS (+)   Anesthesia Other Findings   Reproductive/Obstetrics                             Anesthesia Physical  Anesthesia Plan  ASA: 3  Anesthesia Plan: General   Post-op Pain Management:    Induction: Intravenous  PONV Risk Score and Plan: 3 and TIVA and Treatment may vary due to age or medical condition  Airway Management Planned: Natural Airway  Additional Equipment:   Intra-op Plan:   Post-operative Plan:   Informed Consent: I have reviewed the patients History and Physical, chart, labs and discussed the procedure including the risks, benefits and alternatives for the proposed anesthesia with the patient or authorized representative who has indicated his/her understanding and acceptance.       Plan Discussed with: CRNA and Anesthesiologist  Anesthesia Plan Comments: (Serial consent on chart.)        Anesthesia  Quick Evaluation

## 2022-04-29 NOTE — Transfer of Care (Signed)
Immediate Anesthesia Transfer of Care Note  Patient: Karen [name]  Procedure(s) Performed: ECT TX  Patient Location: PACU  Anesthesia Type:General  Level of Consciousness: drowsy  Airway & Oxygen Therapy: Patient Spontanous Breathing and Patient connected to nasal cannula oxygen  Post-op Assessment: Report given to RN  Post vital signs: stable  Last Vitals:  Vitals Value Taken Time  BP    Temp    Pulse 106 04/29/22 1213  Resp 18 04/29/22 1213  SpO2 93 % 04/29/22 1213  Vitals shown include unvalidated device data.  Last Pain:  Vitals:   04/29/22 1018  TempSrc:   PainSc: 0-No pain         Complications: No notable events documented.

## 2022-04-29 NOTE — H&P (Signed)
Karen [name] is an 46 y.o. female.   Chief Complaint: Follow-up patient with recurrent psychotic depression.  Still having some hallucinations but much less disturbing and no longer giving directions.  Mood overall better.  Mother also reports behavior and mood are much improved HPI: Recurrent psychotic depression with previous response to treatment  Past Medical History:  Diagnosis Date   Ehlers-Danlos syndrome    Fibromyalgia    Headache    Sleep apnea    Tics of organic origin    Transient alteration of awareness     Past Surgical History:  Procedure Laterality Date   COLONOSCOPY WITH PROPOFOL N/A 02/13/2018   Procedure: COLONOSCOPY WITH PROPOFOL;  Surgeon: [name], MD;  Location: [facility] ENDOSCOPY;  Service: Endoscopy;  Laterality: N/A;   COLONOSCOPY WITH PROPOFOL N/A 01/11/2022   Procedure: COLONOSCOPY WITH PROPOFOL;  Surgeon: [name], MD;  Location: [facility] ENDOSCOPY;  Service: Endoscopy;  Laterality: N/A;   DILATION AND CURETTAGE OF UTERUS  08/16/2007   ESOPHAGOGASTRODUODENOSCOPY (EGD) WITH PROPOFOL  02/13/2018   Procedure: ESOPHAGOGASTRODUODENOSCOPY (EGD) WITH PROPOFOL;  Surgeon: [name], MD;  Location: [place] ENDOSCOPY;  Service: Endoscopy;;   EYE SURGERY Left 08/15/1988   3 Surgeries on left eye to correct crossed eye   LAPAROSCOPY Left 08/15/2001   Fallopian Tube   MANDIBLE SURGERY  08/15/1996   OTHER SURGICAL HISTORY Right 08/15/1985   3 Surgeries to repair broken arm   OTHER SURGICAL HISTORY Left    3 surgeries on her left thigh as an infant   OTHER SURGICAL HISTORY  08/15/1996   Jaw surgery   Right arm surgery  08/15/1985   x 3 due to fracture   TOENAIL EXCISION Bilateral 06/13/2019   Procedure: BILATERAL SECOND TOE PARTIAL NAIL ABLATION;  Surgeon: Erle Crocker, MD;  Location: [place];  Service: Orthopedics;  Laterality: Bilateral;  SURGERY REQUEST TIME 1 HOUR   TONSILLECTOMY  12/13/2002   TONSILLECTOMY      Family History   Problem Relation Age of Onset   Depression Mother    Stroke Mother    Fibromyalgia Mother    Osteoarthritis Mother    Asthma Brother    Depression Brother    Migraines Brother    Asperger's syndrome Brother    Other Brother        Ehlers-Danlos Syndrome   Cancer Maternal Aunt    Asperger's syndrome Maternal Aunt    Breast cancer Maternal Aunt    Heart disease Paternal Aunt    Heart disease Paternal Uncle    Cervical cancer Maternal Grandmother    Osteoporosis Maternal Grandmother        Died at 68   Osteoarthritis Maternal Grandmother    Colon cancer Maternal Grandfather    Asthma Maternal Grandfather    Asperger's syndrome Maternal Grandfather    Emphysema Maternal Grandfather        Died at 26   Prostate cancer Maternal Grandfather    Heart attack Paternal Grandfather        Died at 61   Asthma Paternal Grandfather    Tuberculosis Paternal Grandfather    Cancer Cousin    Asperger's syndrome Cousin    Asperger's syndrome Other    Heart Problems Paternal Grandmother        Died at 24   Ehlers-Danlos syndrome Father    Ehlers-Danlos syndrome Brother    Social History:  reports that she has never smoked. She has never used smokeless tobacco. She reports that she does  not drink alcohol and does not use drugs.  Allergies:  Allergies  Allergen Reactions   Augmentin  [Amoxicillin-Pot Clavulanate] Diarrhea   Chocolate Flavor     Other reaction(s): Other (See Comments) Wheezing, acne   Demerol [Meperidine] Other (See Comments)    Slow to wake up when this drug is given.    Keflex [Cephalexin] Nausea And Vomiting   Morphine And Related Nausea And Vomiting   Tape Dermatitis    Must use paper tape only   Toradol [Ketorolac Tromethamine] Nausea And Vomiting   Amoxicillin     Other reaction(s): Unknown   Chocolate     GI distress   Nsaids     patient develops ulcers Other reaction(s): Other (See Comments) patient develops ulcers   Strawberry Extract     GI distress     (Not in a hospital admission)   No results found for this or any previous visit (from the past 48 hour(s)). No results found.  Review of Systems  Constitutional: Negative.   HENT: Negative.    Eyes: Negative.   Respiratory: Negative.    Cardiovascular: Negative.   Gastrointestinal: Negative.   Musculoskeletal: Negative.   Skin: Negative.   Neurological: Negative.   Psychiatric/Behavioral:  Positive for hallucinations. Negative for agitation and dysphoric mood.     Blood pressure 118/82, pulse 98, temperature (!) 97.3 F (36.3 C), temperature source Oral, resp. rate 18, height '4\' 11"'$  (1.499 m), weight 55 kg, SpO2 94 %. Physical Exam Vitals reviewed.  Constitutional:      Appearance: She is well-developed.  HENT:     Head: Normocephalic and atraumatic.  Eyes:     Conjunctiva/sclera: Conjunctivae normal.     Pupils: Pupils are equal, round, and reactive to light.  Cardiovascular:     Heart sounds: Normal heart sounds.  Pulmonary:     Effort: Pulmonary effort is normal.  Abdominal:     Palpations: Abdomen is soft.  Musculoskeletal:        General: Normal range of motion.     Cervical back: Normal range of motion.  Skin:    General: Skin is warm and dry.  Neurological:     General: No focal deficit present.     Mental Status: She is alert.  Psychiatric:        Attention and Perception: She perceives auditory hallucinations.        Mood and Affect: Mood normal.      Assessment/Plan I recommend that we have at least 1 more ECT treatment on Monday after which we can reassess whether we need to continue on Wednesday and either way we will discuss maintenance treatment thereafter.  Alethia Berthold, MD 04/29/2022, 2:16 PM

## 2022-05-02 ENCOUNTER — Ambulatory Visit
Admission: RE | Admit: 2022-05-02 | Discharge: 2022-05-02 | Disposition: A | Discharge status: Home / Self Care | Payer: Medicare Other | Source: Ambulatory Visit | Attending: Psychiatry | Admitting: Psychiatry

## 2022-05-02 ENCOUNTER — Encounter: Payer: Self-pay | Admitting: Anesthesiology

## 2022-05-02 DIAGNOSIS — J45909 Unspecified asthma, uncomplicated: Secondary | ICD-10-CM | POA: Diagnosis not present

## 2022-05-02 DIAGNOSIS — F331 Major depressive disorder, recurrent, moderate: Secondary | ICD-10-CM | POA: Diagnosis not present

## 2022-05-02 DIAGNOSIS — M797 Fibromyalgia: Secondary | ICD-10-CM | POA: Diagnosis not present

## 2022-05-02 DIAGNOSIS — R200 Anesthesia of skin: Secondary | ICD-10-CM

## 2022-05-02 DIAGNOSIS — F333 Major depressive disorder, recurrent, severe with psychotic symptoms: Secondary | ICD-10-CM | POA: Insufficient documentation

## 2022-05-02 DIAGNOSIS — G4730 Sleep apnea, unspecified: Secondary | ICD-10-CM | POA: Diagnosis not present

## 2022-05-02 MED ORDER — SUCCINYLCHOLINE CHLORIDE 200 MG/10ML IV SOSY
PREFILLED_SYRINGE | INTRAVENOUS | Status: DC | PRN
  Administered 2022-05-02: 60 mg via INTRAVENOUS

## 2022-05-02 MED ORDER — MIDAZOLAM HCL 2 MG/2ML IJ SOLN
INTRAMUSCULAR | Status: DC | PRN
  Administered 2022-05-02: 2 mg via INTRAVENOUS

## 2022-05-02 MED ORDER — LABETALOL HCL 5 MG/ML IV SOLN
INTRAVENOUS | Status: DC | PRN
  Administered 2022-05-02: 5 mg via INTRAVENOUS

## 2022-05-02 MED ORDER — METHOHEXITAL SODIUM 100 MG/10ML IV SOSY
PREFILLED_SYRINGE | INTRAVENOUS | Status: DC | PRN
  Administered 2022-05-02: 60 mg via INTRAVENOUS

## 2022-05-02 MED ORDER — SODIUM CHLORIDE 0.9 % IV SOLN: 500.0000 mL | Freq: Once | INTRAVENOUS | Status: AC

## 2022-05-02 NOTE — Anesthesia Postprocedure Evaluation (Signed)
Anesthesia Post Note  Patient: Karen [name]  Procedure(s) Performed: ECT TX  Patient location during evaluation: PACU Anesthesia Type: General Level of consciousness: awake Pain management: pain level controlled Vital Signs Assessment: post-procedure vital signs reviewed and stable Respiratory status: spontaneous breathing and respiratory function stable Cardiovascular status: stable Anesthetic complications: no   No notable events documented.   Last Vitals:  Vitals:   05/02/22 1120 05/02/22 1315  BP: 120/80 (!) 143/91  Pulse: 68 88  Resp: 15 14  Temp: (!) 36.4 C 36.8 C  SpO2: 98% 99%    Last Pain:  Vitals:   05/02/22 1120  TempSrc: Oral  PainSc:                  VAN STAVEREN,Karen [name]

## 2022-05-02 NOTE — Anesthesia Procedure Notes (Signed)
Date/Time: 05/02/2022 1:04 PM  Performed by: Nelda Marseille, CRNAPre-anesthesia Checklist: Patient identified, Emergency Drugs available, Suction available, Patient being monitored and Timeout performed Ventilation: Mask ventilation throughout procedure

## 2022-05-02 NOTE — Procedures (Signed)
ECT SERVICES Physician's Interval Evaluation & Treatment Note  Patient Identification: Karen [name] MRN:  [id number] Date of Evaluation:  05/02/2022 TX #: 5  MADRS:   MMSE:   P.E. Findings:  No change to physical exam  Psychiatric Interval Note:  Hallucinations decreased but still present  Subjective:  Patient is a 46 y.o. female seen for evaluation for Electroconvulsive Therapy. No suicidal ideation  Treatment Summary:   '[]'$   Right Unilateral             '[x]'$  Bilateral   % Energy : 1.0 ms 40%   Impedance: 1500 ohms  Seizure Energy Index: 7979 V squared  Postictal Suppression Index: 81%  Seizure Concordance Index: 90%  Medications  Pre Shock: Labetalol 20 mg Brevital 60 mg succinylcholine 60 mg  Post Shock: Versed 2 mg  Seizure Duration: 47 seconds EEG 23 seconds EMG   Comments: 1 more treatment at least on Wednesday  Lungs:  '[x]'$   Clear to auscultation               '[]'$  Other:   Heart:    '[x]'$   Regular rhythm             '[]'$  irregular rhythm    '[x]'$   Previous H&P reviewed, patient examined and there are NO CHANGES                 '[]'$   Previous H&P reviewed, patient examined and there are changes noted.   Alethia Berthold, MD 9/18/20232:50 PM

## 2022-05-02 NOTE — H&P (Signed)
Karen [name] is an 46 y.o. female.   Chief Complaint: mood better, still having AH HPI: recurrent psychotic depression  Past Medical History:  Diagnosis Date   Ehlers-Danlos syndrome    Fibromyalgia    Headache    Sleep apnea    Tics of organic origin    Transient alteration of awareness     Past Surgical History:  Procedure Laterality Date   COLONOSCOPY WITH PROPOFOL N/A 02/13/2018   Procedure: COLONOSCOPY WITH PROPOFOL;  Surgeon: [name], MD;  Location: [facility] ENDOSCOPY;  Service: Endoscopy;  Laterality: N/A;   COLONOSCOPY WITH PROPOFOL N/A 01/11/2022   Procedure: COLONOSCOPY WITH PROPOFOL;  Surgeon: [name], MD;  Location: [facility] ENDOSCOPY;  Service: Endoscopy;  Laterality: N/A;   DILATION AND CURETTAGE OF UTERUS  08/16/2007   ESOPHAGOGASTRODUODENOSCOPY (EGD) WITH PROPOFOL  02/13/2018   Procedure: ESOPHAGOGASTRODUODENOSCOPY (EGD) WITH PROPOFOL;  Surgeon: [name], MD;  Location: [place] ENDOSCOPY;  Service: Endoscopy;;   EYE SURGERY Left 08/15/1988   3 Surgeries on left eye to correct crossed eye   LAPAROSCOPY Left 08/15/2001   Fallopian Tube   MANDIBLE SURGERY  08/15/1996   OTHER SURGICAL HISTORY Right 08/15/1985   3 Surgeries to repair broken arm   OTHER SURGICAL HISTORY Left    3 surgeries on her left thigh as an infant   OTHER SURGICAL HISTORY  08/15/1996   Jaw surgery   Right arm surgery  08/15/1985   x 3 due to fracture   TOENAIL EXCISION Bilateral 06/13/2019   Procedure: BILATERAL SECOND TOE PARTIAL NAIL ABLATION;  Surgeon: Erle Crocker, MD;  Location: [place];  Service: Orthopedics;  Laterality: Bilateral;  SURGERY REQUEST TIME 1 HOUR   TONSILLECTOMY  12/13/2002   TONSILLECTOMY      Family History  Problem Relation Age of Onset   Depression Mother    Stroke Mother    Fibromyalgia Mother    Osteoarthritis Mother    Asthma Brother    Depression Brother    Migraines Brother    Asperger's syndrome Brother    Other Brother         Ehlers-Danlos Syndrome   Cancer Maternal Aunt    Asperger's syndrome Maternal Aunt    Breast cancer Maternal Aunt    Heart disease Paternal Aunt    Heart disease Paternal Uncle    Cervical cancer Maternal Grandmother    Osteoporosis Maternal Grandmother        Died at 28   Osteoarthritis Maternal Grandmother    Colon cancer Maternal Grandfather    Asthma Maternal Grandfather    Asperger's syndrome Maternal Grandfather    Emphysema Maternal Grandfather        Died at 58   Prostate cancer Maternal Grandfather    Heart attack Paternal Grandfather        Died at 83   Asthma Paternal Grandfather    Tuberculosis Paternal Grandfather    Cancer Cousin    Asperger's syndrome Cousin    Asperger's syndrome Other    Heart Problems Paternal Grandmother        Died at 63   Ehlers-Danlos syndrome Father    Ehlers-Danlos syndrome Brother    Social History:  reports that she has never smoked. She has never used smokeless tobacco. She reports that she does not drink alcohol and does not use drugs.  Allergies:  Allergies  Allergen Reactions   Augmentin  [Amoxicillin-Pot Clavulanate] Diarrhea   Chocolate Flavor     Other reaction(s): Other (See  Comments) Wheezing, acne   Demerol [Meperidine] Other (See Comments)    Slow to wake up when this drug is given.    Keflex [Cephalexin] Nausea And Vomiting   Morphine And Related Nausea And Vomiting   Tape Dermatitis    Must use paper tape only   Toradol [Ketorolac Tromethamine] Nausea And Vomiting   Amoxicillin     Other reaction(s): Unknown   Chocolate     GI distress   Nsaids     patient develops ulcers Other reaction(s): Other (See Comments) patient develops ulcers   Strawberry Extract     GI distress    (Not in a hospital admission)   No results found for this or any previous visit (from the past 48 hour(s)). No results found.  Review of Systems  Constitutional: Negative.   HENT: Negative.    Eyes: Negative.    Respiratory: Negative.    Cardiovascular: Negative.   Gastrointestinal: Negative.   Musculoskeletal: Negative.   Skin: Negative.   Neurological: Negative.   Psychiatric/Behavioral:  Positive for hallucinations.     Blood pressure 118/77, pulse 72, temperature 99 F (37.2 C), resp. rate 14, height '4\' 11"'$  (1.499 m), weight 55 kg, SpO2 96 %. Physical Exam Vitals and nursing note reviewed.  Constitutional:      Appearance: She is well-developed.  HENT:     Head: Normocephalic and atraumatic.  Eyes:     Conjunctiva/sclera: Conjunctivae normal.     Pupils: Pupils are equal, round, and reactive to light.  Cardiovascular:     Heart sounds: Normal heart sounds.  Pulmonary:     Effort: Pulmonary effort is normal.  Abdominal:     Palpations: Abdomen is soft.  Musculoskeletal:        General: Normal range of motion.     Cervical back: Normal range of motion.  Skin:    General: Skin is warm and dry.  Neurological:     General: No focal deficit present.     Mental Status: She is alert.  Psychiatric:        Attention and Perception: Attention normal. She perceives auditory hallucinations.        Mood and Affect: Mood normal.        Speech: Speech is delayed.        Behavior: Behavior is slowed.        Thought Content: Thought content does not include suicidal ideation.        Cognition and Memory: Memory is impaired.      Assessment/Plan Ect today and also see back Wednesday  Alethia Berthold, MD 05/02/2022, 2:36 PM

## 2022-05-02 NOTE — Transfer of Care (Signed)
Immediate Anesthesia Transfer of Care Note  Patient: Karen [name]  Procedure(s) Performed: ECT TX  Patient Location: PACU  Anesthesia Type:General  Level of Consciousness: sedated  Airway & Oxygen Therapy: Patient Spontanous Breathing and Patient connected to face mask oxygen  Post-op Assessment: Report given to RN and Post -op Vital signs reviewed and stable  Post vital signs: Reviewed and stable  Last Vitals:  Vitals Value Taken Time  BP 143/91 05/02/22 1315  Temp 36.8 C 05/02/22 1315  Pulse 82 05/02/22 1315  Resp 10 05/02/22 1315  SpO2 100 % 05/02/22 1315  Vitals shown include unvalidated device data.  Last Pain:  Vitals:   05/02/22 1120  TempSrc: Oral  PainSc:          Complications: No notable events documented.

## 2022-05-02 NOTE — Anesthesia Preprocedure Evaluation (Signed)
Anesthesia Evaluation  Patient identified by MRN, date of birth, ID band Patient awake    Reviewed: Allergy & Precautions, NPO status , Patient's Chart, lab work & pertinent test results  Airway Mallampati: III  TM Distance: <3 FB Neck ROM: full    Dental  (+) Teeth Intact   Pulmonary neg pulmonary ROS, asthma , sleep apnea ,    Pulmonary exam normal breath sounds clear to auscultation       Cardiovascular Exercise Tolerance: Good negative cardio ROS Normal cardiovascular exam Rhythm:Regular     Neuro/Psych  Headaches, Seizures -,  Anxiety Depression negative neurological ROS  negative psych ROS   GI/Hepatic negative GI ROS, Neg liver ROS,   Endo/Other  negative endocrine ROS  Renal/GU negative Renal ROS  negative genitourinary   Musculoskeletal   Abdominal Normal abdominal exam  (+)   Peds negative pediatric ROS (+)  Hematology negative hematology ROS (+)   Anesthesia Other Findings Past Medical History: No date: Ehlers-Danlos syndrome No date: Fibromyalgia No date: Headache No date: Sleep apnea No date: Tics of organic origin No date: Transient alteration of awareness  Past Surgical History: 02/13/2018: COLONOSCOPY WITH PROPOFOL; N/A     Comment:  Procedure: COLONOSCOPY WITH PROPOFOL;  Surgeon: [name], MD;  Location: [facility] ENDOSCOPY;  Service:               Endoscopy;  Laterality: N/A; 01/11/2022: COLONOSCOPY WITH PROPOFOL; N/A     Comment:  Procedure: COLONOSCOPY WITH PROPOFOL;  Surgeon: [name], MD;  Location: [facility] ENDOSCOPY;  Service:               Endoscopy;  Laterality: N/A; 08/16/2007: DILATION AND CURETTAGE OF UTERUS 02/13/2018: ESOPHAGOGASTRODUODENOSCOPY (EGD) WITH PROPOFOL     Comment:  Procedure: ESOPHAGOGASTRODUODENOSCOPY (EGD) WITH               PROPOFOL;  Surgeon: [name], MD;  Location: [facility]               ENDOSCOPY;  Service:  Endoscopy;; 08/15/1988: EYE SURGERY; Left     Comment:  3 Surgeries on left eye to correct crossed eye 08/15/2001: LAPAROSCOPY; Left     Comment:  Fallopian Tube 08/15/1996: MANDIBLE SURGERY 08/15/1985: OTHER SURGICAL HISTORY; Right     Comment:  3 Surgeries to repair broken arm No date: OTHER SURGICAL HISTORY; Left     Comment:  3 surgeries on her left thigh as an infant 08/15/1996: OTHER SURGICAL HISTORY     Comment:  Jaw surgery 08/15/1985: Right arm surgery     Comment:  x 3 due to fracture 06/13/2019: TOENAIL EXCISION; Bilateral     Comment:  Procedure: BILATERAL SECOND TOE PARTIAL NAIL ABLATION;                Surgeon: Erle Crocker, MD;  Location: [place];  Service: Orthopedics;  Laterality:               Bilateral;  SURGERY REQUEST TIME 1 HOUR 12/13/2002: TONSILLECTOMY No date: TONSILLECTOMY  BMI    Body Mass Index: 24.49 kg/m      Reproductive/Obstetrics negative OB ROS  Anesthesia Physical Anesthesia Plan  ASA: 3  Anesthesia Plan: General   Post-op Pain Management:    Induction: Intravenous  PONV Risk Score and Plan: Propofol infusion and TIVA  Airway Management Planned: Natural Airway  Additional Equipment:   Intra-op Plan:   Post-operative Plan:   Informed Consent: I have reviewed the patients History and Physical, chart, labs and discussed the procedure including the risks, benefits and alternatives for the proposed anesthesia with the patient or authorized representative who has indicated his/her understanding and acceptance.     Dental Advisory Given  Plan Discussed with: CRNA and Surgeon  Anesthesia Plan Comments:         Anesthesia Quick Evaluation

## 2022-05-03 ENCOUNTER — Other Ambulatory Visit: Payer: Self-pay | Admitting: Family Medicine

## 2022-05-03 DIAGNOSIS — G8929 Other chronic pain: Secondary | ICD-10-CM

## 2022-05-03 NOTE — Telephone Encounter (Signed)
Medication Refill - Medication: fentaNYL ([place]) 75 MCG/HR  Has the patient contacted their pharmacy? No. She has to call every time  Preferred Pharmacy (with phone number or street name): [name] [place] Has the patient been seen for an appointment in the last year OR does the patient have an upcoming appointment? Yes.    Agent: Please be advised that RX refills may take up to 3 business days. We ask that you follow-up with your pharmacy.

## 2022-05-04 ENCOUNTER — Ambulatory Visit: Payer: Self-pay | Admitting: Certified Registered"

## 2022-05-04 ENCOUNTER — Other Ambulatory Visit: Payer: Self-pay | Admitting: Psychiatry

## 2022-05-04 ENCOUNTER — Encounter: Payer: Self-pay | Admitting: Certified Registered"

## 2022-05-04 ENCOUNTER — Encounter (HOSPITAL_BASED_OUTPATIENT_CLINIC_OR_DEPARTMENT_OTHER)
Admission: RE | Admit: 2022-05-04 | Discharge: 2022-05-04 | Disposition: A | Discharge status: Home / Self Care | Payer: Medicare Other | Source: Ambulatory Visit | Attending: Psychiatry | Admitting: Psychiatry

## 2022-05-04 DIAGNOSIS — F419 Anxiety disorder, unspecified: Secondary | ICD-10-CM | POA: Diagnosis not present

## 2022-05-04 DIAGNOSIS — F333 Major depressive disorder, recurrent, severe with psychotic symptoms: Secondary | ICD-10-CM | POA: Diagnosis not present

## 2022-05-04 DIAGNOSIS — R200 Anesthesia of skin: Secondary | ICD-10-CM | POA: Diagnosis not present

## 2022-05-04 DIAGNOSIS — J45909 Unspecified asthma, uncomplicated: Secondary | ICD-10-CM | POA: Diagnosis not present

## 2022-05-04 DIAGNOSIS — G4730 Sleep apnea, unspecified: Secondary | ICD-10-CM | POA: Diagnosis not present

## 2022-05-04 DIAGNOSIS — F323 Major depressive disorder, single episode, severe with psychotic features: Secondary | ICD-10-CM | POA: Diagnosis not present

## 2022-05-04 MED ORDER — METHOHEXITAL SODIUM 100 MG/10ML IV SOSY
PREFILLED_SYRINGE | INTRAVENOUS | Status: DC | PRN
  Administered 2022-05-04: 60 mg via INTRAVENOUS

## 2022-05-04 MED ORDER — MIDAZOLAM HCL 2 MG/2ML IJ SOLN: 2.0000 mg | Freq: Once | INTRAMUSCULAR | Status: DC

## 2022-05-04 MED ORDER — SUCCINYLCHOLINE CHLORIDE 200 MG/10ML IV SOSY
PREFILLED_SYRINGE | INTRAVENOUS | Status: DC | PRN
  Administered 2022-05-04: 60 mg via INTRAVENOUS

## 2022-05-04 MED ORDER — SODIUM CHLORIDE 0.9 % IV SOLN: 500.0000 mL | Freq: Once | INTRAVENOUS | Status: DC

## 2022-05-04 MED ORDER — SODIUM CHLORIDE 0.9 % IV SOLN: INTRAVENOUS | Status: DC | PRN

## 2022-05-04 MED ORDER — MIDAZOLAM HCL 2 MG/2ML IJ SOLN
INTRAMUSCULAR | Status: DC | PRN
  Administered 2022-05-04: 2 mg via INTRAVENOUS

## 2022-05-04 MED ORDER — FENTANYL 75 MCG/HR TD PT72: 1.0000 | MEDICATED_PATCH | TRANSDERMAL | 0 refills | Status: DC

## 2022-05-04 MED ORDER — MIDAZOLAM HCL 2 MG/2ML IJ SOLN
INTRAMUSCULAR | Status: AC
  Filled 2022-05-04: qty 2

## 2022-05-04 MED ORDER — LABETALOL HCL 5 MG/ML IV SOLN
INTRAVENOUS | Status: DC | PRN
  Administered 2022-05-04 (×2): 5 mg via INTRAVENOUS

## 2022-05-04 NOTE — Anesthesia Preprocedure Evaluation (Signed)
Anesthesia Evaluation  Patient identified by MRN, date of birth, ID band Patient awake    Reviewed: Allergy & Precautions, NPO status , Patient's Chart, lab work & pertinent test results  History of Anesthesia Complications (+) PROLONGED EMERGENCE and history of anesthetic complications  Airway Mallampati: III  TM Distance: >3 FB Neck ROM: full    Dental  (+) Chipped, Poor Dentition, Missing   Pulmonary asthma , sleep apnea ,    Pulmonary exam normal        Cardiovascular (-) Past MI negative cardio ROS Normal cardiovascular exam     Neuro/Psych  Headaches, Seizures -, Well Controlled,  PSYCHIATRIC DISORDERS  Neuromuscular disease    GI/Hepatic negative GI ROS, Neg liver ROS, neg GERD  ,  Endo/Other  negative endocrine ROS  Renal/GU negative Renal ROS  negative genitourinary   Musculoskeletal  (+) Arthritis , Fibromyalgia -  Abdominal   Peds  Hematology negative hematology ROS (+)   Anesthesia Other Findings Past Medical History: No date: Ehlers-Danlos syndrome No date: Fibromyalgia No date: Headache No date: Sleep apnea No date: Tics of organic origin No date: Transient alteration of awareness  Past Surgical History: 02/13/2018: COLONOSCOPY WITH PROPOFOL; N/A     Comment:  Procedure: COLONOSCOPY WITH PROPOFOL;  Surgeon: [name], MD;  Location: [facility] ENDOSCOPY;  Service:               Endoscopy;  Laterality: N/A; 2009: DILATION AND CURETTAGE OF UTERUS 02/13/2018: ESOPHAGOGASTRODUODENOSCOPY (EGD) WITH PROPOFOL     Comment:  Procedure: ESOPHAGOGASTRODUODENOSCOPY (EGD) WITH               PROPOFOL;  Surgeon: [name], MD;  Location: [facility]               ENDOSCOPY;  Service: Endoscopy;; 1990: EYE SURGERY; Left     Comment:  3 Surgeries on left eye to correct crossed eye 2003: LAPAROSCOPY; Left     Comment:  Fallopian Tube 1998: MANDIBLE SURGERY 1987: OTHER SURGICAL HISTORY; Right      Comment:  3 Surgeries to repair broken arm No date: OTHER SURGICAL HISTORY; Left     Comment:  3 surgeries on her left thigh as an infant 1998: OTHER SURGICAL HISTORY     Comment:  Jaw surgery 1987: Right arm surgery     Comment:  x 3 due to fracture 06/13/2019: TOENAIL EXCISION; Bilateral     Comment:  Procedure: BILATERAL SECOND TOE PARTIAL NAIL ABLATION;                Surgeon: Erle Crocker, MD;  Location: [place];  Service: Orthopedics;  Laterality:               Bilateral;  SURGERY REQUEST TIME 1 HOUR 12/13/2002: TONSILLECTOMY     Reproductive/Obstetrics negative OB ROS                             Anesthesia Physical  Anesthesia Plan  ASA: 3  Anesthesia Plan: General   Post-op Pain Management:    Induction: Intravenous  PONV Risk Score and Plan: TIVA  Airway Management Planned: Mask  Additional Equipment:   Intra-op Plan:   Post-operative Plan:   Informed Consent: I have reviewed the patients History and Physical, chart, labs  and discussed the procedure including the risks, benefits and alternatives for the proposed anesthesia with the patient or authorized representative who has indicated his/her understanding and acceptance.     Dental Advisory Given  Plan Discussed with: Anesthesiologist, CRNA and Surgeon  Anesthesia Plan Comments: (History and phone consent from the patients mother Kajsa Butrum  Mother consented for risks of anesthesia including but not limited to:  - adverse reactions to medications - risk of airway placement if required - damage to eyes, teeth, lips or other oral mucosa - nerve damage due to positioning  - sore throat or hoarseness - Damage to heart, brain, nerves, lungs, other parts of body or loss of life  She voiced understanding.)        Anesthesia Quick Evaluation

## 2022-05-04 NOTE — Procedures (Signed)
ECT SERVICES Physician's Interval Evaluation & Treatment Note  Patient Identification: Karen [name] MRN:  [id number] Date of Evaluation:  05/04/2022 TX #: [id number]  MADRS:   MMSE:   P.E. Findings:  No change to physical exam  Psychiatric Interval Note:  Mood brighter no hallucinations  Subjective:  Patient is a 46 y.o. female seen for evaluation for Electroconvulsive Therapy. Feeling better no longer having auditory hallucinations  Treatment Summary:   '[]'$   Right Unilateral             '[x]'$  Bilateral   % Energy : 1.0 ms 40%   Impedance: 1100 ohms  Seizure Energy Index: 20,031 V squared  Postictal Suppression Index:    Seizure Concordance Index: 84%  Medications  Pre Shock: Labetalol 20 mg Brevital 60 mg succinylcholine 60 mg  Post Shock: Versed 2 mg  Seizure Duration: EMG 41 seconds EEG 72 seconds   Comments: Follow-up in about 3 weeks we recommend  Lungs:  '[x]'$   Clear to auscultation               '[]'$  Other:   Heart:    '[x]'$   Regular rhythm             '[]'$  irregular rhythm    '[x]'$   Previous H&P reviewed, patient examined and there are NO CHANGES                 '[]'$   Previous H&P reviewed, patient examined and there are changes noted.   Alethia Berthold, MD 9/20/20235:12 PM

## 2022-05-04 NOTE — H&P (Signed)
Karen [name] is an 46 y.o. female.   Chief Complaint: No complaint.  Mood improved.  No hallucinations HPI: Recurrent spells of psychotic depression  Past Medical History:  Diagnosis Date   Ehlers-Danlos syndrome    Fibromyalgia    Headache    Sleep apnea    Tics of organic origin    Transient alteration of awareness     Past Surgical History:  Procedure Laterality Date   COLONOSCOPY WITH PROPOFOL N/A 02/13/2018   Procedure: COLONOSCOPY WITH PROPOFOL;  Surgeon: [name], MD;  Location: [facility] ENDOSCOPY;  Service: Endoscopy;  Laterality: N/A;   COLONOSCOPY WITH PROPOFOL N/A 01/11/2022   Procedure: COLONOSCOPY WITH PROPOFOL;  Surgeon: [name], MD;  Location: [facility] ENDOSCOPY;  Service: Endoscopy;  Laterality: N/A;   DILATION AND CURETTAGE OF UTERUS  08/16/2007   ESOPHAGOGASTRODUODENOSCOPY (EGD) WITH PROPOFOL  02/13/2018   Procedure: ESOPHAGOGASTRODUODENOSCOPY (EGD) WITH PROPOFOL;  Surgeon: [name], MD;  Location: [place] ENDOSCOPY;  Service: Endoscopy;;   EYE SURGERY Left 08/15/1988   3 Surgeries on left eye to correct crossed eye   LAPAROSCOPY Left 08/15/2001   Fallopian Tube   MANDIBLE SURGERY  08/15/1996   OTHER SURGICAL HISTORY Right 08/15/1985   3 Surgeries to repair broken arm   OTHER SURGICAL HISTORY Left    3 surgeries on her left thigh as an infant   OTHER SURGICAL HISTORY  08/15/1996   Jaw surgery   Right arm surgery  08/15/1985   x 3 due to fracture   TOENAIL EXCISION Bilateral 06/13/2019   Procedure: BILATERAL SECOND TOE PARTIAL NAIL ABLATION;  Surgeon: Erle Crocker, MD;  Location: [place];  Service: Orthopedics;  Laterality: Bilateral;  SURGERY REQUEST TIME 1 HOUR   TONSILLECTOMY  12/13/2002   TONSILLECTOMY      Family History  Problem Relation Age of Onset   Depression Mother    Stroke Mother    Fibromyalgia Mother    Osteoarthritis Mother    Asthma Brother    Depression Brother    Migraines Brother    Asperger's  syndrome Brother    Other Brother        Ehlers-Danlos Syndrome   Cancer Maternal Aunt    Asperger's syndrome Maternal Aunt    Breast cancer Maternal Aunt    Heart disease Paternal Aunt    Heart disease Paternal Uncle    Cervical cancer Maternal Grandmother    Osteoporosis Maternal Grandmother        Died at 75   Osteoarthritis Maternal Grandmother    Colon cancer Maternal Grandfather    Asthma Maternal Grandfather    Asperger's syndrome Maternal Grandfather    Emphysema Maternal Grandfather        Died at 40   Prostate cancer Maternal Grandfather    Heart attack Paternal Grandfather        Died at 93   Asthma Paternal Grandfather    Tuberculosis Paternal Grandfather    Cancer Cousin    Asperger's syndrome Cousin    Asperger's syndrome Other    Heart Problems Paternal Grandmother        Died at 83   Ehlers-Danlos syndrome Father    Ehlers-Danlos syndrome Brother    Social History:  reports that she has never smoked. She has never used smokeless tobacco. She reports that she does not drink alcohol and does not use drugs.  Allergies:  Allergies  Allergen Reactions   Augmentin  [Amoxicillin-Pot Clavulanate] Diarrhea   Chocolate Flavor  Other reaction(s): Other (See Comments) Wheezing, acne   Demerol [Meperidine] Other (See Comments)    Slow to wake up when this drug is given.    Keflex [Cephalexin] Nausea And Vomiting   Morphine And Related Nausea And Vomiting   Tape Dermatitis    Must use paper tape only   Toradol [Ketorolac Tromethamine] Nausea And Vomiting   Amoxicillin     Other reaction(s): Unknown   Chocolate     GI distress   Nsaids     patient develops ulcers Other reaction(s): Other (See Comments) patient develops ulcers   Strawberry Extract     GI distress    (Not in a hospital admission)   No results found for this or any previous visit (from the past 48 hour(s)). No results found.  Review of Systems  Constitutional: Negative.   HENT:  Negative.    Eyes: Negative.   Respiratory: Negative.    Cardiovascular: Negative.   Gastrointestinal: Negative.   Musculoskeletal: Negative.   Skin: Negative.   Neurological: Negative.   Psychiatric/Behavioral: Negative.      Blood pressure 106/65, pulse 78, temperature 97.8 F (36.6 C), temperature source Oral, resp. rate 15, height '4\' 11"'$  (1.499 m), weight 56 kg, SpO2 97 %. Physical Exam Vitals reviewed.  Constitutional:      Appearance: She is well-developed.  HENT:     Head: Normocephalic and atraumatic.  Eyes:     Conjunctiva/sclera: Conjunctivae normal.     Pupils: Pupils are equal, round, and reactive to light.  Cardiovascular:     Heart sounds: Normal heart sounds.  Pulmonary:     Effort: Pulmonary effort is normal.  Abdominal:     Palpations: Abdomen is soft.  Musculoskeletal:        General: Normal range of motion.     Cervical back: Normal range of motion.  Skin:    General: Skin is warm and dry.  Neurological:     General: No focal deficit present.     Mental Status: She is alert.  Psychiatric:        Mood and Affect: Mood normal.      Assessment/Plan Last treatment of this course today but recommend follow-up in about 3 weeks to which the patient agrees  Alethia Berthold, MD 05/04/2022, 4:56 PM

## 2022-05-04 NOTE — Anesthesia Postprocedure Evaluation (Signed)
Anesthesia Post Note  Patient: Karen [name]  Procedure(s) Performed: ECT TX  Patient location during evaluation: PACU Anesthesia Type: General Level of consciousness: awake and alert Pain management: pain level controlled Vital Signs Assessment: post-procedure vital signs reviewed and stable Respiratory status: spontaneous breathing, nonlabored ventilation, respiratory function stable and patient connected to nasal cannula oxygen Cardiovascular status: blood pressure returned to baseline and stable Postop Assessment: no apparent nausea or vomiting Anesthetic complications: no   No notable events documented.   Last Vitals:  Vitals:   05/04/22 1320 05/04/22 1401  BP:  106/65  Pulse: 77 78  Resp: 14 15  Temp:  36.6 C  SpO2: 97%     Last Pain:  Vitals:   05/04/22 1401  TempSrc: Oral  PainSc: 0-No pain                 Precious Haws Kruz Chiu

## 2022-05-04 NOTE — Telephone Encounter (Signed)
Requested medication (s) are due for refill today: yes  Requested medication (s) are on the active medication list: yes  Last refill:  04/05/22  Future visit scheduled: yes  Notes to clinic:  Unable to refill per protocol, cannot delegate.      Requested Prescriptions  Pending Prescriptions Disp Refills   fentaNYL ([facility]) 75 MCG/HR 10 patch 0    Sig: Place 1 patch onto the skin every 3 (three) days.     Not Delegated - Analgesics:  Opioid Agonists Failed - 05/03/2022  1:58 PM      Failed - This refill cannot be delegated      Passed - Urine Drug Screen completed in last 360 days      Passed - Valid encounter within last 3 months    Recent Outpatient Visits           1 month ago Pain in toes of both feet   [facility] Birdie Sons, MD   3 months ago Chronic bilateral back pain, unspecified back location   [facility] Birdie Sons, MD   4 months ago DDD (degenerative disc disease), lumbar   [facility] Birdie Sons, MD   5 months ago Conjunctivitis of both eyes, unspecified conjunctivitis type   [facility] [name], PA-C   6 months ago Mauldin, Donald E, MD       Future Appointments             In 6 days Fisher, Kirstie Peri, MD [facility], [place]   In 2 weeks Deneise Lever, MD [facility]   In 1 month Ralene Bathe, MD [place]   In 2 months [place], [place]

## 2022-05-04 NOTE — Transfer of Care (Signed)
Immediate Anesthesia Transfer of Care Note  Patient: Karen [name]  Procedure(s) Performed: ECT TX  Patient Location: PACU  Anesthesia Type:General  Level of Consciousness: drowsy  Airway & Oxygen Therapy: Patient Spontanous Breathing  Post-op Assessment: Report given to RN and Post -op Vital signs reviewed and stable  Post vital signs: Reviewed and stable  Last Vitals:  Vitals Value Taken Time  BP 119/76   Temp    Pulse 71 05/04/22 1241  Resp 19 05/04/22 1241  SpO2 95 % 05/04/22 1241  Vitals shown include unvalidated device data.  Last Pain:  Vitals:   05/04/22 1000  TempSrc:   PainSc: 0-No pain         Complications: No notable events documented.

## 2022-05-10 ENCOUNTER — Encounter: Payer: Self-pay | Admitting: Family Medicine

## 2022-05-10 ENCOUNTER — Ambulatory Visit (INDEPENDENT_AMBULATORY_CARE_PROVIDER_SITE_OTHER): Payer: Medicare Other | Admitting: Family Medicine

## 2022-05-10 ENCOUNTER — Telehealth: Payer: Self-pay

## 2022-05-10 VITALS — BP 107/75 | HR 91 | Wt 122.0 lb

## 2022-05-10 DIAGNOSIS — M549 Dorsalgia, unspecified: Secondary | ICD-10-CM

## 2022-05-10 DIAGNOSIS — G40909 Epilepsy, unspecified, not intractable, without status epilepticus: Secondary | ICD-10-CM

## 2022-05-10 DIAGNOSIS — G8929 Other chronic pain: Secondary | ICD-10-CM | POA: Diagnosis not present

## 2022-05-10 DIAGNOSIS — M5136 Other intervertebral disc degeneration, lumbar region: Secondary | ICD-10-CM

## 2022-05-10 DIAGNOSIS — M069 Rheumatoid arthritis, unspecified: Secondary | ICD-10-CM

## 2022-05-10 DIAGNOSIS — R82998 Other abnormal findings in urine: Secondary | ICD-10-CM | POA: Diagnosis not present

## 2022-05-10 DIAGNOSIS — F22 Delusional disorders: Secondary | ICD-10-CM

## 2022-05-10 DIAGNOSIS — Q796 Ehlers-Danlos syndrome, unspecified: Secondary | ICD-10-CM

## 2022-05-10 DIAGNOSIS — Z23 Encounter for immunization: Secondary | ICD-10-CM | POA: Diagnosis not present

## 2022-05-10 LAB — POCT URINALYSIS DIPSTICK
Bilirubin, UA: NEGATIVE
Blood, UA: NEGATIVE
Glucose, UA: NEGATIVE
Ketones, UA: NEGATIVE
Leukocytes, UA: NEGATIVE
Nitrite, UA: NEGATIVE
Protein, UA: NEGATIVE
Spec Grav, UA: <=1.005 — AB (ref 1.010–1.025)
Urobilinogen, UA: 0.2 E.U./dL
pH, UA: 6 (ref 5.0–8.0)

## 2022-05-10 NOTE — Telephone Encounter (Signed)
Pt called for lab results - no note from provider to review.  Pt would like provider to write a prescription for a new electric wheelchair.   Pt cuurently has a "Quantum  Q6 Edge" which is about 45 years old.  Pt would like this model or similar model ordered.

## 2022-05-10 NOTE — Progress Notes (Signed)
I,Tiffany J Bragg,acting as a scribe for Lelon Huh, MD.,have documented all relevant documentation on the behalf of Lelon Huh, MD,as directed by  Lelon Huh, MD while in the presence of Lelon Huh, MD.   Established patient visit   Patient: Karen [name]   DOB: [birth year]   46 y.o. Female  MRN: [id number] Visit Date: 05/10/2022  Today's healthcare provider: Lelon Huh, MD   Chief Complaint  Patient presents with   Back Pain   Depression   Subjective    HPI  Follow up for back pain  The patient was last seen for this 4 months ago. Changes made at last visit include increased gabapentin.  She reports excellent compliance with treatment. She feels that condition is Improved. She is not having side effects.   -----------------------------------------------------------------------------------------   -----------------------------------------------------------------------------------------  Depression, Follow-up  She  was last seen for this 4 months ago. Changes made at last visit include continue medications.   She reports excellent compliance with treatment. She is not having side effects.   She reports excellent tolerance of treatment. Current symptoms include: impaired memory She feels she is Improved since last visit.     05/10/2022    2:18 PM 01/17/2022    3:32 PM 12/07/2021    2:25 PM  Depression screen PHQ 2/9  Decreased Interest 1  3  Down, Depressed, Hopeless 1  3  PHQ - 2 Score 2  6  Altered sleeping 2  3  Tired, decreased energy 2  3  Change in appetite 0  0  Feeling bad or failure about yourself  2  3  Trouble concentrating 2  3  Moving slowly or fidgety/restless 0  3  Suicidal thoughts 0  0  PHQ-9 Score 10  21  Difficult doing work/chores Somewhat difficult  Very difficult     Information is confidential and restricted. Go to Review Flowsheets to unlock data.     -----------------------------------------------------------------------------------------   Medications: Outpatient Medications Prior to Visit  Medication Sig   albuterol (VENTOLIN HFA) 108 (90 Base) MCG/ACT inhaler Inhale 2 puffs into the lungs every 6 (six) hours as needed.   busPIRone (BUSPAR) 10 MG tablet Take 10 mg by mouth 2 (two) times daily.   busPIRone (BUSPAR) 15 MG tablet Take 15 mg by mouth 2 (two) times daily.   celecoxib (CELEBREX) 200 MG capsule TAKE 1 CAPSULE BY MOUTH TWICE A DAY   diclofenac Sodium (VOLTAREN) 1 % GEL APPLY AS NEEDED 3 TIMES A DAY   DULoxetine (CYMBALTA) 60 MG capsule Take 2 capsules (120 mg total) by mouth daily.   fentaNYL (DURAGESIC) 75 MCG/HR Place 1 patch onto the skin every 3 (three) days.   fexofenadine (ALLEGRA) 180 MG tablet Take 180 mg by mouth daily.   fluticasone (FLONASE) 50 MCG/ACT nasal spray    Fluticasone-Salmeterol (ADVAIR) 250-50 MCG/DOSE AEPB Inhale 1 puff into the lungs every 12 (twelve) hours. Rinse mouth after use   gabapentin (NEURONTIN) 800 MG tablet TAKE 1 TABLET BY MOUTH 3 TIMES DAILY.   haloperidol (HALDOL) 0.5 MG tablet Take 0.5 tablets (0.25 mg total) by mouth 2 (two) times daily.   Na Sulfate-K Sulfate-Mg Sulf 17.5-3.13-1.6 GM/177ML SOLN See admin instructions.   naloxone (NARCAN) nasal spray 4 mg/0.1 mL Place 1 spray into the nose as needed.   omeprazole (PRILOSEC) 40 MG capsule Take 1 capsule (40 mg total) by mouth 2 (two) times daily. SCHEDULE OFFICE VISIT   ondansetron (ZOFRAN-ODT) 4 MG disintegrating tablet DISSOLVE 1  TABLET IN MOUTH EVERY 8 HOURS FOR NAUSEA AND VOMITING   QUEtiapine (SEROQUEL) 25 MG tablet Take 1 tablet (25 mg total) by mouth at bedtime.   tiZANidine (ZANAFLEX) 4 MG tablet TAKE 0.5-1 TABLETS (2-4 MG TOTAL) BY MOUTH EVERY 8 (EIGHT) HOURS AS NEEDED FOR MUSCLE SPASMS.   trimethoprim-polymyxin b (POLYTRIM) ophthalmic solution Place 1 drop into both eyes every 6 (six) hours.   No facility-administered  medications prior to visit.    Review of Systems  Constitutional:  Negative for appetite change, chills, fatigue and fever.  Respiratory:  Negative for chest tightness and shortness of breath.   Cardiovascular:  Negative for chest pain and palpitations.  Gastrointestinal:  Negative for abdominal pain, nausea and vomiting.  Neurological:  Negative for dizziness and weakness.       Objective    BP 107/75 (BP Location: Right Arm, Patient Position: Sitting, Cuff Size: Normal)   Pulse 91   Wt 122 lb (55.3 kg)   BMI 24.64 kg/m    Physical Exam   Awake, alert, oriented x 3. Sitting comfortably in motorized wheelchair In no apparent distress  Multiple failing components of wheelchair are demonstrated    Assessment & Plan     1. Paranoia (psychosis) ([place]) She reports she does have appointment with new psychiatrist scheduled for next month. Recently completely ECT treatments per Dr. Lind Covert and anticipates continuing maintenance treatments.   2. Need for influenza vaccination  - Flu Vaccine QUAD 6+ mos PF IM (Fluarix Quad PF)  3. Dark urine Previous psychotic episodes were thought to have been precipitated by UTI - Urine Culture  4. Rheumatoid arthritis, involving unspecified site, unspecified whether rheumatoid factor present ([place])   5. DDD (degenerative disc disease), lumbar   6. Chronic bilateral back pain, unspecified back location   7. Seizure disorder ([place])   8. Ehlers-Danlos syndrome She continues to require motorized wheelchair for mobility and ADLs. Her current wheelchair is several years old with multiple failing components. She is in need of new motorized wheelchair.        The entirety of the information documented in the History of Present Illness, Review of Systems and Physical Exam were personally obtained by me. Portions of this information were initially documented by the CMA and reviewed by me for thoroughness and accuracy.     Lelon Huh, MD   [facility] [phone] (phone) [phone] (fax)  [place]

## 2022-05-10 NOTE — Patient Instructions (Signed)
.   Please review the attached list of medications and notify my office if there are any errors.   . Please bring all of your medications to every appointment so we can make sure that our medication list is the same as yours.   

## 2022-05-11 DIAGNOSIS — M79675 Pain in left toe(s): Secondary | ICD-10-CM | POA: Diagnosis not present

## 2022-05-11 DIAGNOSIS — M79674 Pain in right toe(s): Secondary | ICD-10-CM | POA: Diagnosis not present

## 2022-05-11 NOTE — Telephone Encounter (Signed)
Pt has called in again this evening and states that she wants the results from her lab test as has not been released. Please fu with pt as soon as possible [phone]

## 2022-05-12 ENCOUNTER — Ambulatory Visit
Admission: RE | Admit: 2022-05-12 | Discharge: 2022-05-12 | Disposition: A | Discharge status: Home / Self Care | Payer: Medicare Other | Source: Ambulatory Visit | Attending: Family Medicine | Admitting: Family Medicine

## 2022-05-12 DIAGNOSIS — Z1231 Encounter for screening mammogram for malignant neoplasm of breast: Secondary | ICD-10-CM | POA: Diagnosis not present

## 2022-05-12 LAB — URINE CULTURE

## 2022-05-12 LAB — SPECIMEN STATUS REPORT

## 2022-05-12 NOTE — Telephone Encounter (Signed)
Please advise results? 

## 2022-05-12 NOTE — Telephone Encounter (Signed)
Culture negative, have already sent MyChart result note.

## 2022-05-12 NOTE — Telephone Encounter (Signed)
Patient called office and notified of following results: The urine culture is negative. No sign of infection.

## 2022-05-16 ENCOUNTER — Ambulatory Visit (INDEPENDENT_AMBULATORY_CARE_PROVIDER_SITE_OTHER): Payer: Medicare Other | Admitting: Physician Assistant

## 2022-05-16 VITALS — BP 106/80 | HR 75 | Temp 98.1°F

## 2022-05-16 DIAGNOSIS — L603 Nail dystrophy: Secondary | ICD-10-CM | POA: Diagnosis not present

## 2022-05-16 DIAGNOSIS — B353 Tinea pedis: Secondary | ICD-10-CM | POA: Diagnosis not present

## 2022-05-16 NOTE — Progress Notes (Signed)
[place],acting as a Education administrator for [employer], PA-C.,have documented all relevant documentation on the behalf of Mardene Speak, PA-C,as directed by  [employer], PA-C while in the presence of [employer], PA-C.    Established patient visit   Patient: Karen [name]   DOB: 25-Apr-1976   46 y.o. Female  MRN: [id number] Visit Date: 05/16/2022  Today's healthcare provider: Mardene Speak, PA-C   CC: possible onyshcomycosis  Subjective    HPI  Patient is a 46 year old female who presents for evaluation of possible onychomycosis.  Patient has history of having this in the past.  She has had her the toenail of the right third toe removed several years ago by podiatry.  She was seen on 06/14/22 by podiatry and told that they would not prescribe oral medication for this due to the need to monitor lab values.    There has not been ay sores, bleeding or other drainage from any of the toes.  She reports all her toes are affected.  Medications: Outpatient Medications Prior to Visit  Medication Sig   albuterol (VENTOLIN HFA) 108 (90 Base) MCG/ACT inhaler Inhale 2 puffs into the lungs every 6 (six) hours as needed.   busPIRone (BUSPAR) 10 MG tablet Take 10 mg by mouth 2 (two) times daily.   busPIRone (BUSPAR) 15 MG tablet Take 15 mg by mouth 2 (two) times daily.   celecoxib (CELEBREX) 200 MG capsule TAKE 1 CAPSULE BY MOUTH TWICE A DAY   diclofenac Sodium (VOLTAREN) 1 % GEL APPLY AS NEEDED 3 TIMES A DAY   DULoxetine (CYMBALTA) 60 MG capsule Take 2 capsules (120 mg total) by mouth daily.   fentaNYL (DURAGESIC) 75 MCG/HR Place 1 patch onto the skin every 3 (three) days.   fexofenadine (ALLEGRA) 180 MG tablet Take 180 mg by mouth daily.   fluticasone (FLONASE) 50 MCG/ACT nasal spray    Fluticasone-Salmeterol (ADVAIR) 250-50 MCG/DOSE AEPB Inhale 1 puff into the lungs every 12 (twelve) hours. Rinse mouth after use   gabapentin (NEURONTIN) 800 MG tablet TAKE 1 TABLET BY MOUTH 3 TIMES  DAILY.   haloperidol (HALDOL) 0.5 MG tablet Take 0.5 tablets (0.25 mg total) by mouth 2 (two) times daily.   Na Sulfate-K Sulfate-Mg Sulf 17.5-3.13-1.6 GM/177ML SOLN See admin instructions.   naloxone (NARCAN) nasal spray 4 mg/0.1 mL Place 1 spray into the nose as needed.   omeprazole (PRILOSEC) 40 MG capsule Take 1 capsule (40 mg total) by mouth 2 (two) times daily. SCHEDULE OFFICE VISIT   ondansetron (ZOFRAN-ODT) 4 MG disintegrating tablet DISSOLVE 1 TABLET IN MOUTH EVERY 8 HOURS FOR NAUSEA AND VOMITING   QUEtiapine (SEROQUEL) 25 MG tablet Take 1 tablet (25 mg total) by mouth at bedtime.   tiZANidine (ZANAFLEX) 4 MG tablet TAKE 0.5-1 TABLETS (2-4 MG TOTAL) BY MOUTH EVERY 8 (EIGHT) HOURS AS NEEDED FOR MUSCLE SPASMS.   trimethoprim-polymyxin b (POLYTRIM) ophthalmic solution Place 1 drop into both eyes every 6 (six) hours.   No facility-administered medications prior to visit.    Review of Systems  All other systems reviewed and are negative. Except see hpi     Objective    BP 106/80 (BP Location: Right Arm, Patient Position: Sitting, Cuff Size: Normal)   Pulse 75   Temp 98.1 F (36.7 C) (Oral)   SpO2 99%    Physical Exam Vitals reviewed.  Constitutional:      Appearance: Normal appearance.  HENT:     Nose: Nose normal.  Eyes:  Extraocular Movements: Extraocular movements intact.  Pulmonary:     Effort: Pulmonary effort is normal.  Musculoskeletal:        General: No swelling or tenderness.     Cervical back: Normal range of motion.     Right lower leg: No edema.     Left lower leg: No edema.     Comments: Bound to wheelchair  Skin:    General: Skin is warm.     Comments: Scaling, mild maceration/ulceration of the right second interdigital space and plantar aspects of right forefoot;   Subungual hyperkeratosis; Secondary ungual changes: convex, irregular, striated nail plate with dull, rough surface in nails of hallux/bilaterally, right V toe. Absence of toenail  /right III toe Right hammer toe noted  Neurological:     General: No focal deficit present.     Mental Status: She is alert and oriented to person, place, and time.  Psychiatric:        Mood and Affect: Mood normal.        Behavior: Behavior normal.        Thought Content: Thought content normal.        Judgment: Judgment normal.       No results found for any visits on 05/16/22.  Assessment & Plan     1. Tinea pedis of right foot Mild.   Aluminum acetate soak (Burow's solution; [place], 1 pack to 1 quart warm water) to decrease itching and acute eczematous reaction Antifungal cream of choice BID after soaks    2. Dystrophic nail Avoid factors that promote fungal growth (i.e., heat, moisture, occlusion, tight-fitting shoes). Avoid secondary infections. Ciclopirox: 8% nail lacquer (available generically): Apply once daily to affected nails  for up to 48 weeks; remove lacquer with alcohol every 7 days, and then file away loose nail material and trim nails.    Recommended regular Nail dbridement to remove infected keratin by podiatry? Mechanical: File with abrasive stone or curette. Chemical: Protect peripheral tissue with adhesive strips; apply ointment of 29% salicylic acid, 79% urea, or 50% potassium iodide under occlusive dressing. Dbridement may be combined with topical antifungal therapy.  Vicks VapoRub application to nails daily for 48 weeks  In the past, pt had a bilateral second toenail partial ablation for toenail thickening and pain with onychomycosis.  Oral antifungals cannot be recommended as pt takes haldol and Seroquel with possible interractions between itraconazole/terbinafine and those two medications Advised on topica treatment which might include   Avoid factors that promote fungal growth (i.e., heat, moisture, occlusion, tight-fitting shoes). Treat underlying disease risk factors. Treat secondary infections.      Encouraged to see Dr. Caryn Section for her  chronic conditions  The patient was advised to call back or seek an in-person evaluation if the symptoms worsen or if the condition fails to improve as anticipated.  I discussed the assessment and treatment plan with the patient. The patient was provided an opportunity to ask questions and all were answered. The patient agreed with the plan and demonstrated an understanding of the instructions.  The entirety of the information documented in the History of Present Illness, Review of Systems and Physical Exam were personally obtained by me. Portions of this information were initially documented by the CMA and reviewed by me for thoroughness and accuracy.  Portions of this note were created using dictation software and may contain typographical errors.    Mardene Speak, PA-C  [facility] [phone] (phone) [phone] (fax)  [place]

## 2022-05-17 ENCOUNTER — Telehealth: Payer: Self-pay | Admitting: Physician Assistant

## 2022-05-17 MED ORDER — CICLOPIROX 8 % EX KIT: 1.0000 "application " | PACK | Freq: Every day | CUTANEOUS | 1 refills | Status: DC

## 2022-05-17 NOTE — Telephone Encounter (Signed)
Patient advised and verbalized understanding 

## 2022-05-17 NOTE — Telephone Encounter (Signed)
Please, let pt know that I sent a Rx for topical solution. Advised to read my visit note for an additional management. Thank you

## 2022-05-19 ENCOUNTER — Telehealth: Payer: Self-pay | Admitting: Family Medicine

## 2022-05-19 NOTE — Telephone Encounter (Signed)
Patient was seen 05-10-2022 and documented need for motorized wheelchair, but I can't find any paperwork regarding where she is ordering it from. Can patient's mother have the wheelchair company send order request to [place].

## 2022-05-20 ENCOUNTER — Encounter: Payer: Self-pay | Admitting: Family Medicine

## 2022-05-20 DIAGNOSIS — Z23 Encounter for immunization: Secondary | ICD-10-CM | POA: Diagnosis not present

## 2022-05-20 NOTE — Telephone Encounter (Signed)
NA. Left message advising as below. Left our fax number on vm for company to fax [place] the forms.

## 2022-05-22 ENCOUNTER — Other Ambulatory Visit: Payer: Self-pay | Admitting: Psychiatry

## 2022-05-22 DIAGNOSIS — R200 Anesthesia of skin: Secondary | ICD-10-CM

## 2022-05-22 NOTE — Progress Notes (Deleted)
HPI  female never smoker referred by Dr. Gaynell Face for management of severe mixed sleep apnea (central predominant), chronic hypoxic and hypercapnic respiratory failure, complicated by Ehlers-Danlos syndrome, fibromyalgia, seasonal allergic rhinitis, asthma, degenerative disc disease, rheumatoid arthritis, question of past seizure disorder, Tics NPSG 08/20/17-severe Mixed Sleep apnea (Central predominant)-AHI 134.1./hour, desaturation to 80%, 38.9 minutes recorded <= 88%, body weight 140 pounds, Central Apnea Index 119.8/ hr BiPAP ST titration sleep study 10/11/2017-  Titrated to 24/ 13, back up rate 16 with minimal O2 sat 87%. Residual CAI 13.7/ hr Office Spirometry 09/29/17-mild restriction of exhaled volume.  Flow is normal for volume.  FVC 2.29/74%, FEV1 2.0/78%, ratio 2.87, FEF 25-75% 2.87/101%    =============================================================================    (N.B.---Our next step if BiPAP ST proves insufficient, would be to look at [facility], perhaps EPAP 6, minimum PS 5, maximum PS 12)      09/07/20- 46 year old female never smoker referred by Dr. Gaynell Face for management of severe Mixed Sleep Apnea (central predominant), chronic hypoxic and hypercapnic Respiratory Failure, complicated by Ehlers-Danlos syndrome( Dr Gaynell Face), fibromyalgia, seasonal allergic rhinitis, asthma, degenerative disc disease, rheumatoid arthritis, question of past seizure disorder, Tics -Adderall XR 15 1 each AM, Advair 250, Ventolin hfa,  BIPAP ST 24/13, back up rate 16/ [employer], continuing adderall xr 15, and refit mask Download- compliance 20%, AHI 9.3/ hr    Many missed and short nights Body weight today- 140 lbs Covid vax- 3 Phizer Flu vax- had Using AirCurve 10 ST. Was doing much better with it at last visit. Followed long-term by Dr Gaynell Face for her Ehler-Danlos, Migraine and tension HA, Tics. He noted in October that she is falling asleep during the day, then unable to sleep at  night.  Dr Caryn Section, PCP, is managing her chronic pain. She notes waking from sleep with mouth open, Denies wheeze and not needing inhaler now. Usually asthma is worst in Spring- seasonal issues discussed.   05/24/22- 46 year old female never smoker referred by Dr. Gaynell Face for management of severe Mixed Sleep Apnea (central predominant), chronic hypoxic and hypercapnic Respiratory Failure, complicated by Ehlers-Danlos syndrome( Dr Gaynell Face), fibromyalgia, seasonal allergic rhinitis, asthma, degenerative disc disease, rheumatoid arthritis, question of past seizure disorder, Tics -Adderall XR 15 1 each AM, Advair 250, Ventolin hfa,  BIPAP ST 24/13, back up rate 16/ [employer], AirCurve 10 ST. Download- compliance Body weight today-  Covid vax- 3 Phizer Flu vax       ROS-see HPI  + = positive Constitutional:    weight loss, night sweats, fevers, chills, +fatigue, lassitude. HEENT:    headaches, difficulty swallowing, tooth/dental problems, sore throat,       sneezing, itching, ear ache, nasal congestion, post nasal drip, snoring CV:    chest pain, orthopnea, PND, swelling in lower extremities, anasarca,                                                      dizziness, palpitations Resp:  + shortness of breath with exertion or at rest.                productive cough,   non-productive cough, coughing up of blood.              change in color of mucus.  +wheezing.   Skin:    rash or lesions. GI:  No-  heartburn, indigestion, abdominal pain, nausea, vomiting, diarrhea,                 change in bowel habits, loss of appetite GU: dysuria, change in color of urine, no urgency or frequency.   flank pain. MS:   joint pain, stiffness, decreased range of motion, back pain. Neuro-     + HPI Psych:  change in mood or affect.  depression or anxiety.   memory loss.  OBJ- Physical Exam General- Awake, Oriented, Affect-appropriate, Distress- none acute,                 +Eyelid droop/ flaccid face make her  look drowsy. +Power chair.  Skin- rash-none, lesions- none, excoriation- none Lymphadenopathy- none Head- atraumatic            Eyes- +proptosis?            Ears- Hearing, canals-normal            Nose- + mild turbinate edemea, +Septal dev, mucus, polyps, erosion, perforation             Throat- Mallampati III , mucosa clear , drainage- none, tonsils- atrophic Neck- flexible , trachea midline, no stridor , thyroid nl, carotid no bruit Chest - symmetrical excursion , unlabored           Heart/CV- RRR , no murmur heard , no gallop  , no rub, nl s1 s2                           - JVD- none , edema- none, stasis changes- none, varices- none           Lung- +slightly raspy L base, wheeze- none, cough-none, dullness-none, rub- none           Chest wall-  Abd-  Br/ Gen/ Rectal- Not done, not indicated Extrem- cyanosis- none, clubbing, none, atrophy- none, strength- nl Neuro-+ very little muscle movement, slight face, but mentation seems appropriate.

## 2022-05-23 ENCOUNTER — Encounter
Admission: RE | Admit: 2022-05-23 | Discharge: 2022-05-23 | Disposition: A | Discharge status: Home / Self Care | Payer: Medicare Other | Source: Ambulatory Visit | Attending: Psychiatry | Admitting: Psychiatry

## 2022-05-23 ENCOUNTER — Encounter: Payer: Self-pay | Admitting: Registered Nurse

## 2022-05-23 DIAGNOSIS — G4730 Sleep apnea, unspecified: Secondary | ICD-10-CM | POA: Diagnosis not present

## 2022-05-23 DIAGNOSIS — F323 Major depressive disorder, single episode, severe with psychotic features: Secondary | ICD-10-CM | POA: Insufficient documentation

## 2022-05-23 DIAGNOSIS — J45909 Unspecified asthma, uncomplicated: Secondary | ICD-10-CM | POA: Diagnosis not present

## 2022-05-23 DIAGNOSIS — R200 Anesthesia of skin: Secondary | ICD-10-CM | POA: Diagnosis not present

## 2022-05-23 DIAGNOSIS — F333 Major depressive disorder, recurrent, severe with psychotic symptoms: Secondary | ICD-10-CM

## 2022-05-23 MED ORDER — GLYCOPYRROLATE 0.2 MG/ML IJ SOLN
INTRAMUSCULAR | Status: AC
  Administered 2022-05-23: 0.1 mg via INTRAVENOUS
  Filled 2022-05-23: qty 1

## 2022-05-23 MED ORDER — SUCCINYLCHOLINE CHLORIDE 200 MG/10ML IV SOSY
PREFILLED_SYRINGE | INTRAVENOUS | Status: DC | PRN
  Administered 2022-05-23: 60 mg via INTRAVENOUS

## 2022-05-23 MED ORDER — OLANZAPINE 15 MG PO TABS: 15.0000 mg | ORAL_TABLET | Freq: Every day | ORAL | 2 refills | Status: DC

## 2022-05-23 MED ORDER — METHOHEXITAL SODIUM 0.5 G IJ SOLR
INTRAMUSCULAR | Status: AC
  Filled 2022-05-23: qty 500

## 2022-05-23 MED ORDER — GLYCOPYRROLATE 0.2 MG/ML IJ SOLN: 0.1000 mg | Freq: Once | INTRAMUSCULAR | Status: AC

## 2022-05-23 MED ORDER — MIDAZOLAM HCL 2 MG/2ML IJ SOLN
INTRAMUSCULAR | Status: AC
  Filled 2022-05-23: qty 2

## 2022-05-23 MED ORDER — SODIUM CHLORIDE 0.9 % IV SOLN: 500.0000 mL | Freq: Once | INTRAVENOUS | Status: AC

## 2022-05-23 MED ORDER — LABETALOL HCL 5 MG/ML IV SOLN
INTRAVENOUS | Status: AC
  Filled 2022-05-23: qty 4

## 2022-05-23 MED ORDER — MIDAZOLAM HCL 2 MG/2ML IJ SOLN
2.0000 mg | Freq: Once | INTRAMUSCULAR | Status: AC
  Administered 2022-05-23: 2 mg via INTRAVENOUS

## 2022-05-23 MED ORDER — LABETALOL HCL 5 MG/ML IV SOLN
INTRAVENOUS | Status: DC | PRN
  Administered 2022-05-23: 5 mg via INTRAVENOUS

## 2022-05-23 MED ORDER — METHOHEXITAL SODIUM 100 MG/10ML IV SOSY
PREFILLED_SYRINGE | INTRAVENOUS | Status: DC | PRN
  Administered 2022-05-23: 70 mg via INTRAVENOUS

## 2022-05-23 NOTE — H&P (Signed)
Karen [name] is an 46 y.o. female.   Chief Complaint: Hearing voices again.  Depressed.  No suicidal intent HPI: Recurrent psychotic depression  Past Medical History:  Diagnosis Date   Ehlers-Danlos syndrome    Fibromyalgia    Headache    Polyp of descending colon    Sleep apnea    Tics of organic origin    Transient alteration of awareness     Past Surgical History:  Procedure Laterality Date   COLONOSCOPY WITH PROPOFOL N/A 02/13/2018   Procedure: COLONOSCOPY WITH PROPOFOL;  Surgeon: [name], MD;  Location: [facility] ENDOSCOPY;  Service: Endoscopy;  Laterality: N/A;   COLONOSCOPY WITH PROPOFOL N/A 01/11/2022   Procedure: COLONOSCOPY WITH PROPOFOL;  Surgeon: [name], MD;  Location: [facility] ENDOSCOPY;  Service: Endoscopy;  Laterality: N/A;   DILATION AND CURETTAGE OF UTERUS  08/16/2007   ESOPHAGOGASTRODUODENOSCOPY (EGD) WITH PROPOFOL  02/13/2018   Procedure: ESOPHAGOGASTRODUODENOSCOPY (EGD) WITH PROPOFOL;  Surgeon: [name], MD;  Location: [facility] ENDOSCOPY;  Service: Endoscopy;;   EYE SURGERY Left 08/15/1988   3 Surgeries on left eye to correct crossed eye   LAPAROSCOPY Left 08/15/2001   Fallopian Tube   MANDIBLE SURGERY  08/15/1996   OTHER SURGICAL HISTORY Right 08/15/1985   3 Surgeries to repair broken arm   OTHER SURGICAL HISTORY Left    3 surgeries on her left thigh as an infant   OTHER SURGICAL HISTORY  08/15/1996   Jaw surgery   Right arm surgery  08/15/1985   x 3 due to fracture   TOENAIL EXCISION Bilateral 06/13/2019   Procedure: BILATERAL SECOND TOE PARTIAL NAIL ABLATION;  Surgeon: Erle Crocker, MD;  Location: [facility];  Service: Orthopedics;  Laterality: Bilateral;  SURGERY REQUEST TIME 1 HOUR   TONSILLECTOMY  12/13/2002   TONSILLECTOMY      Family History  Problem Relation Age of Onset   Depression Mother    Stroke Mother    Fibromyalgia Mother    Osteoarthritis Mother    Asthma Brother    Depression Brother    Migraines  Brother    Asperger's syndrome Brother    Other Brother        Ehlers-Danlos Syndrome   Cancer Maternal Aunt    Asperger's syndrome Maternal Aunt    Breast cancer Maternal Aunt    Heart disease Paternal Aunt    Heart disease Paternal Uncle    Cervical cancer Maternal Grandmother    Osteoporosis Maternal Grandmother        Died at 67   Osteoarthritis Maternal Grandmother    Colon cancer Maternal Grandfather    Asthma Maternal Grandfather    Asperger's syndrome Maternal Grandfather    Emphysema Maternal Grandfather        Died at 3   Prostate cancer Maternal Grandfather    Heart attack Paternal Grandfather        Died at 15   Asthma Paternal Grandfather    Tuberculosis Paternal Grandfather    Cancer Cousin    Asperger's syndrome Cousin    Asperger's syndrome Other    Heart Problems Paternal Grandmother        Died at 53   Ehlers-Danlos syndrome Father    Ehlers-Danlos syndrome Brother    Social History:  reports that she has never smoked. She has never used smokeless tobacco. She reports that she does not drink alcohol and does not use drugs.  Allergies:  Allergies  Allergen Reactions   Augmentin  [Amoxicillin-Pot Clavulanate] Diarrhea  Chocolate Flavor     Other reaction(s): Other (See Comments) Wheezing, acne   Demerol [Meperidine] Other (See Comments)    Slow to wake up when this drug is given.    Keflex [Cephalexin] Nausea And Vomiting   Morphine And Related Nausea And Vomiting   Tape Dermatitis    Must use paper tape only   Toradol [Ketorolac Tromethamine] Nausea And Vomiting   Amoxicillin     Other reaction(s): Unknown   Chocolate     GI distress   Nsaids     patient develops ulcers Other reaction(s): Other (See Comments) patient develops ulcers   Strawberry Extract     GI distress    (Not in a hospital admission)   No results found for this or any previous visit (from the past 48 hour(s)). No results found.  Review of Systems  Constitutional:  Negative.   HENT: Negative.    Eyes: Negative.   Respiratory: Negative.    Cardiovascular: Negative.   Gastrointestinal: Negative.   Musculoskeletal: Negative.   Skin: Negative.   Neurological: Negative.   Psychiatric/Behavioral:  Positive for dysphoric mood and hallucinations.   All other systems reviewed and are negative.   Blood pressure 106/69, pulse 68, temperature 97.8 F (36.6 C), resp. rate 14, height '4\' 11"'$  (1.499 m), weight 55.3 kg, SpO2 94 %. Physical Exam Constitutional:      Appearance: She is well-developed.  HENT:     Head: Normocephalic and atraumatic.  Eyes:     Conjunctiva/sclera: Conjunctivae normal.     Pupils: Pupils are equal, round, and reactive to light.  Cardiovascular:     Heart sounds: Normal heart sounds.  Pulmonary:     Effort: Pulmonary effort is normal.  Abdominal:     Palpations: Abdomen is soft.  Musculoskeletal:        General: Normal range of motion.     Cervical back: Normal range of motion.  Skin:    General: Skin is warm and dry.  Neurological:     Mental Status: She is alert.  Psychiatric:        Attention and Perception: She perceives auditory hallucinations.        Mood and Affect: Mood is depressed.        Speech: Speech normal.        Behavior: Behavior is withdrawn.        Thought Content: Thought content does not include suicidal ideation.      Assessment/Plan We are going to do treatment today and then see her back in a week and in addition I would like her to start taking olanzapine 15 mg at night for which I have given her and her mother a prescription  Alethia Berthold, MD 05/23/2022, 4:41 PM

## 2022-05-23 NOTE — Transfer of Care (Signed)
Immediate Anesthesia Transfer of Care Note  Patient: Karen [name]  Procedure(s) Performed: * No procedures listed *  Patient Location: PACU  Anesthesia Type:General  Level of Consciousness: sedated  Airway & Oxygen Therapy: Patient Spontanous Breathing and Patient connected to face mask oxygen  Post-op Assessment: Report given to RN and Post -op Vital signs reviewed and stable  Post vital signs: Reviewed and stable  Last Vitals:  Vitals:   05/23/22 1040 05/23/22 1450  BP: 129/82 (!) 128/92  Pulse: 74 98  Resp: 15 16  Temp: 36.8 C 36.6 C  SpO2: 29% 19%    Complications: No apparent anesthesia complications

## 2022-05-23 NOTE — Anesthesia Preprocedure Evaluation (Addendum)
Anesthesia Evaluation  Patient identified by MRN, date of birth, ID band Patient awake    Reviewed: Allergy & Precautions, NPO status , Patient's Chart, lab work & pertinent test results  Airway Mallampati: II  TM Distance: >3 FB Neck ROM: Full    Dental  (+) Teeth Intact   Pulmonary neg pulmonary ROS, asthma , sleep apnea    Pulmonary exam normal breath sounds clear to auscultation       Cardiovascular Exercise Tolerance: Good negative cardio ROS Normal cardiovascular exam Rhythm:Regular     Neuro/Psych  Headaches, Seizures -,   Anxiety Depression    negative neurological ROS  negative psych ROS   GI/Hepatic negative GI ROS, Neg liver ROS,,,  Endo/Other  negative endocrine ROS    Renal/GU negative Renal ROS  negative genitourinary   Musculoskeletal   Abdominal  (+) + obese  Peds negative pediatric ROS (+)  Hematology negative hematology ROS (+)   Anesthesia Other Findings Past Medical History: No date: Ehlers-Danlos syndrome No date: Fibromyalgia No date: Headache No date: Polyp of descending colon No date: Sleep apnea No date: Tics of organic origin No date: Transient alteration of awareness  Past Surgical History: 02/13/2018: COLONOSCOPY WITH PROPOFOL; N/A     Comment:  Procedure: COLONOSCOPY WITH PROPOFOL;  Surgeon: [name], MD;  Location: [facility] ENDOSCOPY;  Service:               Endoscopy;  Laterality: N/A; 01/11/2022: COLONOSCOPY WITH PROPOFOL; N/A     Comment:  Procedure: COLONOSCOPY WITH PROPOFOL;  Surgeon: [name], MD;  Location: [facility] ENDOSCOPY;  Service:               Endoscopy;  Laterality: N/A; 08/16/2007: DILATION AND CURETTAGE OF UTERUS 02/13/2018: ESOPHAGOGASTRODUODENOSCOPY (EGD) WITH PROPOFOL     Comment:  Procedure: ESOPHAGOGASTRODUODENOSCOPY (EGD) WITH               PROPOFOL;  Surgeon: [name], MD;  Location: [facility]               ENDOSCOPY;   Service: Endoscopy;; 08/15/1988: EYE SURGERY; Left     Comment:  3 Surgeries on left eye to correct crossed eye 08/15/2001: LAPAROSCOPY; Left     Comment:  Fallopian Tube 08/15/1996: MANDIBLE SURGERY 08/15/1985: OTHER SURGICAL HISTORY; Right     Comment:  3 Surgeries to repair broken arm No date: OTHER SURGICAL HISTORY; Left     Comment:  3 surgeries on her left thigh as an infant 08/15/1996: OTHER SURGICAL HISTORY     Comment:  Jaw surgery 08/15/1985: Right arm surgery     Comment:  x 3 due to fracture 06/13/2019: TOENAIL EXCISION; Bilateral     Comment:  Procedure: BILATERAL SECOND TOE PARTIAL NAIL ABLATION;                Surgeon: Erle Crocker, MD;  Location: [facility];  Service: Orthopedics;  Laterality:               Bilateral;  SURGERY REQUEST TIME 1 HOUR 12/13/2002: TONSILLECTOMY No date: TONSILLECTOMY  BMI    Body Mass Index: 24.64 kg/m      Reproductive/Obstetrics negative OB ROS  Anesthesia Physical Anesthesia Plan  ASA: 3  Anesthesia Plan: General   Post-op Pain Management:    Induction: Intravenous  PONV Risk Score and Plan: Propofol infusion and TIVA  Airway Management Planned: Natural Airway and Mask  Additional Equipment:   Intra-op Plan:   Post-operative Plan:   Informed Consent: I have reviewed the patients History and Physical, chart, labs and discussed the procedure including the risks, benefits and alternatives for the proposed anesthesia with the patient or authorized representative who has indicated his/her understanding and acceptance.     Dental Advisory Given  Plan Discussed with: CRNA and Surgeon  Anesthesia Plan Comments:        Anesthesia Quick Evaluation

## 2022-05-23 NOTE — Procedures (Signed)
ECT SERVICES Physician's Interval Evaluation & Treatment Note  Patient Identification: Karen [name] MRN:  [id number] Date of Evaluation:  05/23/2022 TX #: 7  MADRS:   MMSE:   P.E. Findings:  No change to her basic physical exam.  Limited weakness.  Psychiatric Interval Note:  Reports that auditory hallucinations and depression are returned although insight is still retained  Subjective:  Patient is a 46 y.o. female seen for evaluation for Electroconvulsive Therapy. Auditory hallucinations and depression with no active suicidal intent  Treatment Summary:   '[]'$   Right Unilateral             '[x]'$  Bilateral   % Energy : 1.0 ms 40%   Impedance: 1530 ohms  Seizure Energy Index: 8689 V squared  Postictal Suppression Index: 15%  Seizure Concordance Index: 90%  Medications  Pre Shock: Labetalol 20 mg Brevital 60 mg succinylcholine 60 mg  Post Shock: Versed 2 mg  Seizure Duration: EMG 33 seconds EEG 62 seconds   Comments: We will see her back in a week and also I am giving her a prescription to start olanzapine 15 mg at night  Lungs:  '[x]'$   Clear to auscultation               '[]'$  Other:   Heart:    '[x]'$   Regular rhythm             '[]'$  irregular rhythm    '[x]'$   Previous H&P reviewed, patient examined and there are NO CHANGES                 '[]'$   Previous H&P reviewed, patient examined and there are changes noted.   Alethia Berthold, MD 10/9/20234:43 PM

## 2022-05-23 NOTE — Anesthesia Procedure Notes (Signed)
Date/Time: 05/23/2022 2:30 PM  Performed by: Doreen Salvage, CRNAPre-anesthesia Checklist: Patient identified, Emergency Drugs available, Suction available and Patient being monitored Patient Re-evaluated:Patient Re-evaluated prior to induction Oxygen Delivery Method: Circle system utilized Preoxygenation: Pre-oxygenation with 100% oxygen Induction Type: IV induction Ventilation: Mask ventilation without difficulty and Mask ventilation throughout procedure Airway Equipment and Method: Bite block Placement Confirmation: positive ETCO2 Dental Injury: Teeth and Oropharynx as per pre-operative assessment

## 2022-05-24 ENCOUNTER — Ambulatory Visit: Payer: Medicare Other | Admitting: Internal Medicine

## 2022-05-26 ENCOUNTER — Other Ambulatory Visit: Payer: Self-pay | Admitting: Family Medicine

## 2022-05-26 DIAGNOSIS — G8929 Other chronic pain: Secondary | ICD-10-CM

## 2022-05-26 NOTE — Telephone Encounter (Signed)
Medication Refill - Medication: fentaNYL (DURAGESIC) 75 MCG/HR   Has the patient contacted their pharmacy? No.  Preferred Pharmacy (with phone number or street name):  [id number] - [place], [place] - [address] Phone: [phone]  Fax: [phone]     Has the patient been seen for an appointment in the last year OR does the patient have an upcoming appointment? Yes.    Agent: Please be advised that RX refills may take up to 3 business days. We ask that you follow-up with your pharmacy.  

## 2022-05-27 NOTE — Anesthesia Postprocedure Evaluation (Signed)
Anesthesia Post Note  Patient: Karen [name]  Procedure(s) Performed: ECT TX  Patient location during evaluation: PACU Anesthesia Type: General Level of consciousness: awake Pain management: pain level controlled Vital Signs Assessment: post-procedure vital signs reviewed and stable Respiratory status: respiratory function stable Cardiovascular status: stable Anesthetic complications: no   No notable events documented.   Last Vitals:  Vitals:   05/23/22 1530 05/23/22 1540  BP: 105/80 106/69  Pulse: 75 68  Resp: 14 14  Temp:  36.6 C  SpO2: 93% 94%    Last Pain:  Vitals:   05/23/22 1540  TempSrc:   PainSc: 0-No pain                 VAN STAVEREN,Daritza Brees

## 2022-05-27 NOTE — Telephone Encounter (Signed)
Requested medication (s) are due for refill today: Due 06/03/22  Requested medication (s) are on the active medication list: yes    Last refill: 05/04/22  #10  0 refills  Future visit scheduled no  Notes to clinic:Not delegated, please review. Thank you.  Requested Prescriptions  Pending Prescriptions Disp Refills   fentaNYL (DURAGESIC) 75 MCG/HR 10 patch 0    Sig: Place 1 patch onto the skin every 3 (three) days.     Not Delegated - Analgesics:  Opioid Agonists Failed - 05/26/2022  4:26 PM      Failed - This refill cannot be delegated      Passed - Urine Drug Screen completed in last 360 days      Passed - Valid encounter within last 3 months    Recent Outpatient Visits           1 week ago Tinea pedis of right foot   [facility] [place], [place], PA-C   2 weeks ago Need for influenza vaccination   [facility] Birdie Sons, MD   2 months ago Pain in toes of both feet   [facility] Birdie Sons, MD   4 months ago Chronic bilateral back pain, unspecified back location   [facility] Birdie Sons, MD   5 months ago DDD (degenerative disc disease), lumbar   [facility] Birdie Sons, MD       Future Appointments             In 1 week Ralene Bathe, MD [place]   In 1 month [place], Shannon A, [place]

## 2022-05-29 ENCOUNTER — Other Ambulatory Visit: Payer: Self-pay | Admitting: Psychiatry

## 2022-05-29 DIAGNOSIS — R200 Anesthesia of skin: Secondary | ICD-10-CM

## 2022-05-30 ENCOUNTER — Ambulatory Visit
Admission: RE | Admit: 2022-05-30 | Discharge: 2022-05-30 | Disposition: A | Discharge status: Home / Self Care | Payer: Medicare Other | Source: Ambulatory Visit | Attending: Psychiatry | Admitting: Psychiatry

## 2022-05-30 DIAGNOSIS — J4530 Mild persistent asthma, uncomplicated: Secondary | ICD-10-CM | POA: Diagnosis not present

## 2022-05-30 DIAGNOSIS — M1990 Unspecified osteoarthritis, unspecified site: Secondary | ICD-10-CM | POA: Diagnosis not present

## 2022-05-30 DIAGNOSIS — R569 Unspecified convulsions: Secondary | ICD-10-CM | POA: Insufficient documentation

## 2022-05-30 DIAGNOSIS — G4730 Sleep apnea, unspecified: Secondary | ICD-10-CM | POA: Diagnosis not present

## 2022-05-30 DIAGNOSIS — M797 Fibromyalgia: Secondary | ICD-10-CM | POA: Insufficient documentation

## 2022-05-30 DIAGNOSIS — Z5189 Encounter for other specified aftercare: Secondary | ICD-10-CM | POA: Diagnosis not present

## 2022-05-30 DIAGNOSIS — Q796 Ehlers-Danlos syndrome, unspecified: Secondary | ICD-10-CM | POA: Diagnosis not present

## 2022-05-30 DIAGNOSIS — J45909 Unspecified asthma, uncomplicated: Secondary | ICD-10-CM | POA: Insufficient documentation

## 2022-05-30 DIAGNOSIS — F418 Other specified anxiety disorders: Secondary | ICD-10-CM | POA: Insufficient documentation

## 2022-05-30 DIAGNOSIS — F333 Major depressive disorder, recurrent, severe with psychotic symptoms: Secondary | ICD-10-CM | POA: Diagnosis not present

## 2022-05-30 DIAGNOSIS — R200 Anesthesia of skin: Secondary | ICD-10-CM

## 2022-05-30 DIAGNOSIS — R519 Headache, unspecified: Secondary | ICD-10-CM | POA: Diagnosis not present

## 2022-05-30 MED ORDER — SUCCINYLCHOLINE CHLORIDE 200 MG/10ML IV SOSY
PREFILLED_SYRINGE | INTRAVENOUS | Status: DC | PRN
  Administered 2022-05-30: 60 mg via INTRAVENOUS

## 2022-05-30 MED ORDER — METHOHEXITAL SODIUM 100 MG/10ML IV SOSY
PREFILLED_SYRINGE | INTRAVENOUS | Status: DC | PRN
  Administered 2022-05-30: 70 mg via INTRAVENOUS

## 2022-05-30 MED ORDER — FENTANYL 75 MCG/HR TD PT72: 1.0000 | MEDICATED_PATCH | TRANSDERMAL | 0 refills | Status: DC

## 2022-05-30 MED ORDER — GLYCOPYRROLATE 0.2 MG/ML IJ SOLN
INTRAMUSCULAR | Status: AC
  Administered 2022-05-30: 0.1 mg via INTRAVENOUS
  Filled 2022-05-30: qty 1

## 2022-05-30 MED ORDER — LABETALOL HCL 5 MG/ML IV SOLN
INTRAVENOUS | Status: DC | PRN
  Administered 2022-05-30: 5 mg via INTRAVENOUS

## 2022-05-30 MED ORDER — SODIUM CHLORIDE 0.9 % IV SOLN: INTRAVENOUS | Status: DC | PRN

## 2022-05-30 MED ORDER — ONDANSETRON HCL 4 MG/2ML IJ SOLN
INTRAMUSCULAR | Status: AC
  Administered 2022-05-30: 4 mg via INTRAVENOUS
  Filled 2022-05-30: qty 2

## 2022-05-30 MED ORDER — MIDAZOLAM HCL 2 MG/2ML IJ SOLN
INTRAMUSCULAR | Status: DC | PRN
  Administered 2022-05-30: 2 mg via INTRAVENOUS

## 2022-05-30 MED ORDER — MIDAZOLAM HCL 2 MG/2ML IJ SOLN
INTRAMUSCULAR | Status: AC
  Filled 2022-05-30: qty 2

## 2022-05-30 MED ORDER — SODIUM CHLORIDE 0.9 % IV SOLN
500.0000 mL | Freq: Once | INTRAVENOUS | Status: AC
  Administered 2022-05-30: 500 mL via INTRAVENOUS

## 2022-05-30 MED ORDER — ONDANSETRON HCL 4 MG/2ML IJ SOLN: 4.0000 mg | Freq: Once | INTRAMUSCULAR | Status: AC

## 2022-05-30 MED ORDER — GLYCOPYRROLATE 0.2 MG/ML IJ SOLN: 0.1000 mg | Freq: Once | INTRAMUSCULAR | Status: AC

## 2022-05-30 NOTE — H&P (Signed)
Karen [name] is an 46 y.o. female.   Chief Complaint: No complaint HPI: Mood is generally feeling pretty good no voices  Past Medical History:  Diagnosis Date   Ehlers-Danlos syndrome    Fibromyalgia    Headache    Polyp of descending colon    Sleep apnea    Tics of organic origin    Transient alteration of awareness     Past Surgical History:  Procedure Laterality Date   COLONOSCOPY WITH PROPOFOL N/A 02/13/2018   Procedure: COLONOSCOPY WITH PROPOFOL;  Surgeon: [name], MD;  Location: [facility] ENDOSCOPY;  Service: Endoscopy;  Laterality: N/A;   COLONOSCOPY WITH PROPOFOL N/A 01/11/2022   Procedure: COLONOSCOPY WITH PROPOFOL;  Surgeon: [name], MD;  Location: [facility] ENDOSCOPY;  Service: Endoscopy;  Laterality: N/A;   DILATION AND CURETTAGE OF UTERUS  08/16/2007   ESOPHAGOGASTRODUODENOSCOPY (EGD) WITH PROPOFOL  02/13/2018   Procedure: ESOPHAGOGASTRODUODENOSCOPY (EGD) WITH PROPOFOL;  Surgeon: [name], MD;  Location: [facility] ENDOSCOPY;  Service: Endoscopy;;   EYE SURGERY Left 08/15/1988   3 Surgeries on left eye to correct crossed eye   LAPAROSCOPY Left 08/15/2001   Fallopian Tube   MANDIBLE SURGERY  08/15/1996   OTHER SURGICAL HISTORY Right 08/15/1985   3 Surgeries to repair broken arm   OTHER SURGICAL HISTORY Left    3 surgeries on her left thigh as an infant   OTHER SURGICAL HISTORY  08/15/1996   Jaw surgery   Right arm surgery  08/15/1985   x 3 due to fracture   TOENAIL EXCISION Bilateral 06/13/2019   Procedure: BILATERAL SECOND TOE PARTIAL NAIL ABLATION;  Surgeon: Erle Crocker, MD;  Location: [facility];  Service: Orthopedics;  Laterality: Bilateral;  SURGERY REQUEST TIME 1 HOUR   TONSILLECTOMY  12/13/2002   TONSILLECTOMY      Family History  Problem Relation Age of Onset   Depression Mother    Stroke Mother    Fibromyalgia Mother    Osteoarthritis Mother    Asthma Brother    Depression Brother    Migraines Brother    Asperger's  syndrome Brother    Other Brother        Ehlers-Danlos Syndrome   Cancer Maternal Aunt    Asperger's syndrome Maternal Aunt    Breast cancer Maternal Aunt    Heart disease Paternal Aunt    Heart disease Paternal Uncle    Cervical cancer Maternal Grandmother    Osteoporosis Maternal Grandmother        Died at 24   Osteoarthritis Maternal Grandmother    Colon cancer Maternal Grandfather    Asthma Maternal Grandfather    Asperger's syndrome Maternal Grandfather    Emphysema Maternal Grandfather        Died at 55   Prostate cancer Maternal Grandfather    Heart attack Paternal Grandfather        Died at 71   Asthma Paternal Grandfather    Tuberculosis Paternal Grandfather    Cancer Cousin    Asperger's syndrome Cousin    Asperger's syndrome Other    Heart Problems Paternal Grandmother        Died at 42   Ehlers-Danlos syndrome Father    Ehlers-Danlos syndrome Brother    Social History:  reports that she has never smoked. She has never used smokeless tobacco. She reports that she does not drink alcohol and does not use drugs.  Allergies:  Allergies  Allergen Reactions   Augmentin  [Amoxicillin-Pot Clavulanate] Diarrhea   Chocolate  Flavor     Other reaction(s): Other (See Comments) Wheezing, acne   Demerol [Meperidine] Other (See Comments)    Slow to wake up when this drug is given.    Keflex [Cephalexin] Nausea And Vomiting   Morphine And Related Nausea And Vomiting   Tape Dermatitis    Must use paper tape only   Toradol [Ketorolac Tromethamine] Nausea And Vomiting   Amoxicillin     Other reaction(s): Unknown   Chocolate     GI distress   Nsaids     patient develops ulcers Other reaction(s): Other (See Comments) patient develops ulcers   Strawberry Extract     GI distress    (Not in a hospital admission)   No results found for this or any previous visit (from the past 48 hour(s)). No results found.  Review of Systems  Constitutional: Negative.   HENT:  Negative.    Eyes: Negative.   Respiratory: Negative.    Cardiovascular: Negative.   Gastrointestinal: Negative.   Musculoskeletal: Negative.   Skin: Negative.   Neurological: Negative.   Psychiatric/Behavioral: Negative.    All other systems reviewed and are negative.   Blood pressure (!) 124/92, pulse 81, temperature 98.4 F (36.9 C), temperature source Oral, resp. rate 18, height '4\' 11"'$  (1.499 m), weight 55.3 kg, SpO2 99 %. Physical Exam Vitals reviewed.  Constitutional:      Appearance: She is well-developed.  HENT:     Head: Normocephalic and atraumatic.  Eyes:     Conjunctiva/sclera: Conjunctivae normal.     Pupils: Pupils are equal, round, and reactive to light.  Cardiovascular:     Heart sounds: Normal heart sounds.  Pulmonary:     Effort: Pulmonary effort is normal.  Abdominal:     Palpations: Abdomen is soft.  Musculoskeletal:        General: Normal range of motion.     Cervical back: Normal range of motion.  Skin:    General: Skin is warm and dry.  Neurological:     General: No focal deficit present.     Mental Status: She is alert.  Psychiatric:        Mood and Affect: Mood normal.        Thought Content: Thought content normal.      Assessment/Plan Stable.  Continue with ECT maintenance.  I had like to see her back in 3 weeks  Alethia Berthold, MD 05/30/2022, 7:03 PM

## 2022-05-30 NOTE — Anesthesia Preprocedure Evaluation (Signed)
Anesthesia Evaluation  Patient identified by MRN, date of birth, ID band Patient awake    Reviewed: Allergy & Precautions, NPO status , Patient's Chart, lab work & pertinent test results  Airway Mallampati: III  TM Distance: <3 FB Neck ROM: full    Dental  (+) Teeth Intact   Pulmonary neg pulmonary ROS, asthma , sleep apnea ,    Pulmonary exam normal breath sounds clear to auscultation       Cardiovascular Exercise Tolerance: Good negative cardio ROS Normal cardiovascular exam Rhythm:Regular     Neuro/Psych  Headaches, Seizures -,  Anxiety Depression negative neurological ROS  negative psych ROS   GI/Hepatic negative GI ROS, Neg liver ROS,   Endo/Other  negative endocrine ROS  Renal/GU negative Renal ROS  negative genitourinary   Musculoskeletal  (+) Arthritis , Fibromyalgia -  Abdominal Normal abdominal exam  (+)   Peds negative pediatric ROS (+)  Hematology negative hematology ROS (+)   Anesthesia Other Findings Past Medical History: No date: Ehlers-Danlos syndrome No date: Fibromyalgia No date: Headache No date: Polyp of descending colon No date: Sleep apnea No date: Tics of organic origin No date: Transient alteration of awareness  Past Surgical History: 02/13/2018: COLONOSCOPY WITH PROPOFOL; N/A     Comment:  Procedure: COLONOSCOPY WITH PROPOFOL;  Surgeon: [name], MD;  Location: [facility] ENDOSCOPY;  Service:               Endoscopy;  Laterality: N/A; 01/11/2022: COLONOSCOPY WITH PROPOFOL; N/A     Comment:  Procedure: COLONOSCOPY WITH PROPOFOL;  Surgeon: [name], MD;  Location: [facility] ENDOSCOPY;  Service:               Endoscopy;  Laterality: N/A; 08/16/2007: DILATION AND CURETTAGE OF UTERUS 02/13/2018: ESOPHAGOGASTRODUODENOSCOPY (EGD) WITH PROPOFOL     Comment:  Procedure: ESOPHAGOGASTRODUODENOSCOPY (EGD) WITH               PROPOFOL;  Surgeon: [name], MD;   Location: [facility]               ENDOSCOPY;  Service: Endoscopy;; 08/15/1988: EYE SURGERY; Left     Comment:  3 Surgeries on left eye to correct crossed eye 08/15/2001: LAPAROSCOPY; Left     Comment:  Fallopian Tube 08/15/1996: MANDIBLE SURGERY 08/15/1985: OTHER SURGICAL HISTORY; Right     Comment:  3 Surgeries to repair broken arm No date: OTHER SURGICAL HISTORY; Left     Comment:  3 surgeries on her left thigh as an infant 08/15/1996: OTHER SURGICAL HISTORY     Comment:  Jaw surgery 08/15/1985: Right arm surgery     Comment:  x 3 due to fracture 06/13/2019: TOENAIL EXCISION; Bilateral     Comment:  Procedure: BILATERAL SECOND TOE PARTIAL NAIL ABLATION;                Surgeon: Erle Crocker, MD;  Location: [facility];  Service: Orthopedics;  Laterality:               Bilateral;  SURGERY REQUEST TIME 1 HOUR 12/13/2002: TONSILLECTOMY No date: TONSILLECTOMY  BMI    Body Mass Index: 24.64 kg/m      Reproductive/Obstetrics negative OB ROS  Anesthesia Physical Anesthesia Plan  ASA: 2  Anesthesia Plan: General   Post-op Pain Management:    Induction: Intravenous  PONV Risk Score and Plan: Propofol infusion and TIVA  Airway Management Planned: Mask and Natural Airway  Additional Equipment:   Intra-op Plan:   Post-operative Plan:   Informed Consent: I have reviewed the patients History and Physical, chart, labs and discussed the procedure including the risks, benefits and alternatives for the proposed anesthesia with the patient or authorized representative who has indicated his/her understanding and acceptance.     Dental Advisory Given  Plan Discussed with: CRNA and Surgeon  Anesthesia Plan Comments:         Anesthesia Quick Evaluation

## 2022-05-30 NOTE — Transfer of Care (Signed)
Immediate Anesthesia Transfer of Care Note  Patient: Karen [name]  Procedure(s) Performed: ECT TX  Patient Location: PACU  Anesthesia Type:General  Level of Consciousness: drowsy  Airway & Oxygen Therapy: Patient Spontanous Breathing and Patient connected to face mask oxygen  Post-op Assessment: Report given to RN and Post -op Vital signs reviewed and stable  Post vital signs: Reviewed and stable  Last Vitals:  Vitals Value Taken Time  BP    Temp 36.1 C 05/30/22 1235  Pulse 74 05/30/22 1235  Resp 19 05/30/22 1235  SpO2 96 % 05/30/22 1235    Last Pain:  Vitals:   05/30/22 1226  TempSrc:   PainSc: 0-No pain         Complications: No notable events documented.

## 2022-05-30 NOTE — Procedures (Signed)
ECT SERVICES Physician's Interval Evaluation & Treatment Note  Patient Identification: Karen [name] MRN:  [id number] Date of Evaluation:  05/30/2022 TX #: 8  MADRS:   MMSE:   P.E. Findings:  No change to physical exam  Psychiatric Interval Note:  Mood seems to be feeling pretty good no hallucinations no fear no agitation  Subjective:  Patient is a 46 y.o. female seen for evaluation for Electroconvulsive Therapy. No new complaints  Treatment Summary:   '[]'$   Right Unilateral             '[x]'$  Bilateral   % Energy : 1.0 ms 40%   Impedance: 1090 ohms  Seizure Energy Index: 15,147 V squared  Postictal Suppression Index: 44%  Seizure Concordance Index: 82%  Medications  Pre Shock: Labetalol 20 mg Brevital 60 mg succinylcholine 60 mg  Post Shock: Versed 2 mg  Seizure Duration: 44 seconds EMG 73 seconds EEG   Comments: Follow-up in about 3 weeks  Lungs:  '[x]'$   Clear to auscultation               '[]'$  Other:   Heart:    '[x]'$   Regular rhythm             '[]'$  irregular rhythm    '[x]'$   Previous H&P reviewed, patient examined and there are NO CHANGES                 '[]'$   Previous H&P reviewed, patient examined and there are changes noted.   Alethia Berthold, MD 10/16/20237:04 PM

## 2022-05-30 NOTE — Anesthesia Postprocedure Evaluation (Signed)
Anesthesia Post Note  Patient: Karen [name]  Procedure(s) Performed: ECT TX  Patient location during evaluation: PACU Anesthesia Type: General Level of consciousness: awake Pain management: pain level controlled Vital Signs Assessment: post-procedure vital signs reviewed and stable Respiratory status: spontaneous breathing and patient connected to nasal cannula oxygen Cardiovascular status: stable Anesthetic complications: no  No notable events documented.   Last Vitals:  Vitals:   05/30/22 1018 05/30/22 1235  BP: 120/84   Pulse: 67 74  Resp: 17 19  Temp: 36.6 C (!) 36.1 C  SpO2: 97% 96%    Last Pain:  Vitals:   05/30/22 1226  TempSrc:   PainSc: 0-No pain                 VAN STAVEREN,Zalan Shidler

## 2022-05-30 NOTE — Anesthesia Procedure Notes (Signed)
Procedure Name: General with mask airway Date/Time: 05/30/2022 12:18 PM  Performed by: Lily Peer, Bernell Sigal, CRNAPre-anesthesia Checklist: Patient identified, Emergency Drugs available, Suction available, Patient being monitored and Timeout performed Patient Re-evaluated:Patient Re-evaluated prior to induction Oxygen Delivery Method: Circle system utilized Preoxygenation: Pre-oxygenation with 100% oxygen Induction Type: IV induction Ventilation: Mask ventilation throughout procedure

## 2022-05-31 NOTE — Telephone Encounter (Signed)
Orders for Standard Written order replacements/Repair parts for a power wheelchair was faxed to the company on 10/06

## 2022-06-02 ENCOUNTER — Telehealth: Payer: Self-pay

## 2022-06-02 NOTE — Telephone Encounter (Signed)
Orders for wheel chair were completed and faxed to [employer] on 05/20/2022 (see media section). I tried calling this company to confirm that orders were received. Left message to call back.   I don't see a PT referral order recently placed. Was this supposed to be ordered? Dr. Caryn Section, did you want [place] to place a PT referral order?

## 2022-06-02 NOTE — Telephone Encounter (Signed)
Copied from [place] [id number]. Topic: General - Other >> Jun 01, 2022  1:41 PM Ludger Nutting wrote: Patient is requesting a PT evaluation and a new electric wheelchair. Please follow up with patient.

## 2022-06-06 NOTE — Telephone Encounter (Signed)
I spoke with agent with [employer]. He states that patients chair is broken and she now needs a new instead of replacement parts. They have faxed orders for PT/ OT evaluation for new wheel chair, and request that Dr. Caryn Section sign them. Orders signed and faxed back to [employer].

## 2022-06-07 ENCOUNTER — Other Ambulatory Visit (INDEPENDENT_AMBULATORY_CARE_PROVIDER_SITE_OTHER): Payer: Self-pay | Admitting: Family

## 2022-06-07 DIAGNOSIS — Q796 Ehlers-Danlos syndrome, unspecified: Secondary | ICD-10-CM

## 2022-06-07 NOTE — Telephone Encounter (Signed)
Last seen 04/04/22 next appt due in Feb- my chart message sent to call and sched appt

## 2022-06-09 ENCOUNTER — Ambulatory Visit (INDEPENDENT_AMBULATORY_CARE_PROVIDER_SITE_OTHER): Payer: Medicare Other | Admitting: Dermatology

## 2022-06-09 DIAGNOSIS — L821 Other seborrheic keratosis: Secondary | ICD-10-CM | POA: Diagnosis not present

## 2022-06-09 DIAGNOSIS — Z808 Family history of malignant neoplasm of other organs or systems: Secondary | ICD-10-CM | POA: Diagnosis not present

## 2022-06-09 DIAGNOSIS — D229 Melanocytic nevi, unspecified: Secondary | ICD-10-CM | POA: Diagnosis not present

## 2022-06-09 DIAGNOSIS — Z79899 Other long term (current) drug therapy: Secondary | ICD-10-CM

## 2022-06-09 DIAGNOSIS — Z1283 Encounter for screening for malignant neoplasm of skin: Secondary | ICD-10-CM | POA: Diagnosis not present

## 2022-06-09 DIAGNOSIS — L814 Other melanin hyperpigmentation: Secondary | ICD-10-CM | POA: Diagnosis not present

## 2022-06-09 DIAGNOSIS — L649 Androgenic alopecia, unspecified: Secondary | ICD-10-CM | POA: Diagnosis not present

## 2022-06-09 DIAGNOSIS — L578 Other skin changes due to chronic exposure to nonionizing radiation: Secondary | ICD-10-CM

## 2022-06-09 NOTE — Patient Instructions (Addendum)
Recommend biotin daily   Recommend minoxidil 5% (Rogaine for men) solution or foam to be applied to the scalp and left in. This should ideally be used twice daily for best results but it helps with hair regrowth when used at least three times per week. Rogaine initially can cause increased hair shedding for the first few weeks but this will stop with continued use. In studies, people who used minoxidil (Rogaine) for at least 6 months had thicker hair than people who did not. Minoxidil topical (Rogaine) only works as long as it continues to be used. If if it is no longer used then the hair it has been helping to regrow can fall out. Minoxidil topical (Rogaine) can cause increased facial hair growth which can usually be managed easily with a battery-operated hair trimmer. If facial hair growth is bothersome, switching to the 2% women's version can decrease the risk of unwanted facial hair growth.  Can purchase Red Light for hair growth will provide information      Female Androgenic Alopecia is a chronic condition related to genetics and/or hormonal changes.  In women androgenetic alopecia is commonly associated with menopause but may occur any time after puberty.  It causes hair thinning primarily on the crown with widening of the part and temporal hairline recession.  Can use OTC Rogaine (minoxidil) 5% solution/foam as directed.  Oral treatments in female patients who have no contraindication may include : - Low dose oral minoxidil 1.25 - '5mg'$  daily - Spironolactone 50 - '100mg'$  bid - Finasteride 2.5 - 5 mg daily Adjunctive therapies include: - Low Level Laser Light Therapy (LLLT) - Platelet-rich plasma injections (PRP) - Hair Transplants or scalp reduction  Telogen effluvium is a benign, self-limited condition causing increased hair shedding usually for several months. It does not progress to baldness, and the hair eventually grows back on its own. It can be triggered by recent illness, recent  surgery, thyroid disease, low iron stores, vitamin D deficiency, fad diets or rapid weight loss, hormonal changes such as pregnancy or birth control pills, and some medication. Usually the hair loss starts 2-3 months after the illness or health change. Rarely, it can continue for longer than a year.  Melanoma ABCDEs  Melanoma is the most dangerous type of skin cancer, and is the leading cause of death from skin disease.  You are more likely to develop melanoma if you: Have light-colored skin, light-colored eyes, or red or blond hair Spend a lot of time in the sun Tan regularly, either outdoors or in a tanning bed Have had blistering sunburns, especially during childhood Have a close family member who has had a melanoma Have atypical moles or large birthmarks  Early detection of melanoma is key since treatment is typically straightforward and cure rates are extremely high if we catch it early.   The first sign of melanoma is often a change in a mole or a new dark spot.  The ABCDE system is a way of remembering the signs of melanoma.  A for asymmetry:  The two halves do not match. B for border:  The edges of the growth are irregular. C for color:  A mixture of colors are present instead of an even brown color. D for diameter:  Melanomas are usually (but not always) greater than 30m - the size of a pencil eraser. E for evolution:  The spot keeps changing in size, shape, and color.  Please check your skin once per month between visits. You can use  a small mirror in front and a large mirror behind you to keep an eye on the back side or your body.   If you see any new or changing lesions before your next follow-up, please call to schedule a visit.  Please continue daily skin protection including broad spectrum sunscreen SPF 30+ to sun-exposed areas, reapplying every 2 hours as needed when you're outdoors.   Staying in the shade or wearing long sleeves, sun glasses (UVA+UVB protection) and wide  brim hats (4-inch brim around the entire circumference of the hat) are also recommended for sun protection.    Due to recent changes in healthcare laws, you may see results of your pathology and/or laboratory studies on MyChart before the doctors have had a chance to review them. We understand that in some cases there may be results that are confusing or concerning to you. Please understand that not all results are received at the same time and often the doctors may need to interpret multiple results in order to provide you with the best plan of care or course of treatment. Therefore, we ask that you please give [place] 2 business days to thoroughly review all your results before contacting the office for clarification. Should we see a critical lab result, you will be contacted sooner.   If You Need Anything After Your Visit  If you have any questions or concerns for your doctor, please call our main line at [phone] and press option 4 to reach your doctor's medical assistant. If no one answers, please leave a voicemail as directed and we will return your call as soon as possible. Messages left after 4 pm will be answered the following business day.   You may also send [place] a message via [place]. We typically respond to MyChart messages within 1-2 business days.  For prescription refills, please ask your pharmacy to contact our office. Our fax number is [phone].  If you have an urgent issue when the clinic is closed that cannot wait until the next business day, you can page your doctor at the number below.    Please note that while we do our best to be available for urgent issues outside of office hours, we are not available 24/7.   If you have an urgent issue and are unable to reach [place], you may choose to seek medical care at your doctor's office, retail clinic, urgent care center, or emergency room.  If you have a medical emergency, please immediately call 911 or go to the emergency  department.  Pager Numbers  - Dr. Nehemiah Massed: [phone]  - Dr. Laurence Ferrari: [phone]  - Dr. Nicole Kindred: [phone]  In the event of inclement weather, please call our main line at [phone] for an update on the status of any delays or closures.  Dermatology Medication Tips: Please keep the boxes that topical medications come in in order to help keep track of the instructions about where and how to use these. Pharmacies typically print the medication instructions only on the boxes and not directly on the medication tubes.   If your medication is too expensive, please contact our office at [phone] option 4 or send [place] a message through [place].   We are unable to tell what your co-pay for medications will be in advance as this is different depending on your insurance coverage. However, we may be able to find a substitute medication at lower cost or fill out paperwork to get insurance to cover a needed medication.   If a  prior authorization is required to get your medication covered by your insurance company, please allow [place] 1-2 business days to complete this process.  Drug prices often vary depending on where the prescription is filled and some pharmacies may offer cheaper prices.  The website www.goodrx.com contains coupons for medications through different pharmacies. The prices here do not account for what the cost may be with help from insurance (it may be cheaper with your insurance), but the website can give you the price if you did not use any insurance.  - You can print the associated coupon and take it with your prescription to the pharmacy.  - You may also stop by our office during regular business hours and pick up a GoodRx coupon card.  - If you need your prescription sent electronically to a different pharmacy, notify our office through [facility] or by phone at [phone] option 4.     Si Usted Necesita Algo Despus de Su Visita  Tambin puede enviarnos un  mensaje a travs de Pharmacist, community. Por lo general respondemos a los mensajes de MyChart en el transcurso de 1 a 2 das hbiles.  Para renovar recetas, por favor pida a su farmacia que se ponga en contacto con nuestra oficina. Harland Dingwall de fax es [place] [phone].  Si tiene un asunto urgente cuando la clnica est cerrada y que no puede esperar hasta el siguiente da hbil, puede llamar/localizar a su doctor(a) al nmero que aparece a continuacin.   Por favor, tenga en cuenta que aunque hacemos todo lo posible para estar disponibles para asuntos urgentes fuera del horario de [place], no estamos disponibles las 24 horas del da, los 7 das de la [place].   Si tiene un problema urgente y no puede comunicarse con nosotros, puede optar por buscar atencin mdica  en el consultorio de su doctor(a), en una clnica privada, en un centro de atencin urgente o en una sala de emergencias.  Si tiene Engineering geologist, por favor llame inmediatamente al 911 o vaya a la sala de emergencias.  Nmeros de bper  - Dr. Nehemiah Massed: [phone]  - Dra. Moye: [phone]  - Dra. Nicole Kindred: [phone]  En caso de inclemencias del [place], por favor llame a Johnsie Kindred principal al [phone] para una actualizacin sobre el [place] de cualquier retraso o cierre.  Consejos para la medicacin en dermatologa: Por favor, guarde las cajas en las que vienen los medicamentos de uso tpico para ayudarle a seguir las instrucciones sobre dnde y cmo usarlos. Las farmacias generalmente imprimen las instrucciones del medicamento slo en las cajas y no directamente en los tubos del [place].   Si su medicamento es muy caro, por favor, pngase en contacto con Zigmund Daniel llamando al [phone] y presione la opcin 4 o envenos un mensaje a travs de Pharmacist, community.   No podemos decirle cul ser su copago por los medicamentos por adelantado ya que esto es diferente dependiendo de la cobertura de su seguro. Sin embargo,  es posible que podamos encontrar un medicamento sustituto a Electrical engineer un formulario para que el seguro cubra el medicamento que se considera necesario.   Si se requiere una autorizacin previa para que su compaa de seguros [place] su medicamento, por favor permtanos de 1 a 2 das hbiles para completar este proceso.  Los precios de los medicamentos varan con frecuencia dependiendo del Environmental consultant de dnde se surte la receta y alguna farmacias pueden ofrecer precios ms baratos.  El sitio web www.goodrx.com tiene cupones  para medicamentos de Airline pilot. Los precios aqu no tienen en cuenta lo que podra costar con la ayuda del seguro (puede ser ms barato con su seguro), pero el sitio web puede darle el precio si no utiliz Research scientist (physical sciences).  - Puede imprimir el cupn correspondiente y llevarlo con su receta a la farmacia.  - Tambin puede pasar por nuestra oficina durante el horario de atencin regular y Charity fundraiser una tarjeta de cupones de GoodRx.  - Si necesita que su receta se enve electrnicamente a una farmacia diferente, informe a nuestra oficina a travs de MyChart de [place] o por telfono llamando al [phone] y presione la opcin 4.

## 2022-06-09 NOTE — Progress Notes (Signed)
New Patient Visit  Subjective  Karen [name] is a 46 y.o. female who presents for the following: New Patient (Initial Visit) (Patient reports family history of skin cancer in mother with bcc, and other skin cancer. Patient denies any skin cancer. Mole at pinky finger. Patient is concerned with thinning hair. ECt treatments may be causing hair loss. Patient reports some foot issues but is being seeing by foot doctor. ). The patient presents for Total-Body Skin Exam (TBSE) for skin cancer screening and mole check.  The patient has spots, moles and lesions to be evaluated, some may be new or changing and the patient has concerns that these could be cancer.  The following portions of the chart were reviewed this encounter and updated as appropriate:   Tobacco  Allergies  Meds  Problems  Med Hx  Surg Hx  Fam Hx     Review of Systems:  No other skin or systemic complaints except as noted in HPI or Assessment and Plan.  Objective  Well appearing patient in no apparent distress; mood and affect are within normal limits.  A full examination was performed including scalp, head, eyes, ears, nose, lips, neck, chest, axillae, abdomen, back, buttocks, bilateral upper extremities, bilateral lower extremities, hands, feet, fingers, toes, fingernails, and toenails. All findings within normal limits unless otherwise noted below.  Scalp Frontal scalp thinning with intact frontal hairline and miniaturization   left dorsal 5th finger 0.3 cm brown macule   left rim of ear 0.4 x 0.3 cm brown macule               Assessment & Plan  Androgenetic alopecia Scalp With Telogen effluvium H/o mitral valve prolapse  H/o uro encephalopathy   Do not recommend oral minoxidil due to history   Female Androgenic Alopecia is a chronic condition related to genetics and/or hormonal changes.  In women androgenetic alopecia is commonly associated with menopause but may occur any time after  puberty.  It causes hair thinning primarily on the crown with widening of the part and temporal hairline recession.  Can use OTC Rogaine (minoxidil) 5% solution/foam as directed.  Oral treatments in female patients who have no contraindication may include : - Low dose oral minoxidil 1.25 - '5mg'$  daily - Spironolactone 50 - '100mg'$  bid - Finasteride 2.5 - 5 mg daily Adjunctive therapies include: - Low Level Laser Light Therapy (LLLT) - Platelet-rich plasma injections (PRP) - Hair Transplants or scalp reduction  Telogen effluvium is a benign, self-limited condition causing increased hair shedding usually for several months. It does not progress to baldness, and the hair eventually grows back on its own. It can be triggered by recent illness, recent surgery, thyroid disease, low iron stores, vitamin D deficiency, fad diets or rapid weight loss, hormonal changes such as pregnancy or birth control pills, and some medication. Usually the hair loss starts 2-3 months after the illness or health change. Rarely, it can continue for longer than a year.  Reviewed lab work from March 2023 - normal  Check TSH, CBC, and CMP  Recommend continue biotin daily  Recommend minoxidil 5% (Rogaine for men) solution or foam to be applied to the scalp and left in. This should ideally be used twice daily for best results but it helps with hair regrowth when used at least three times per week. Rogaine initially can cause increased hair shedding for the first few weeks but this will stop with continued use. In studies, people who used minoxidil (Rogaine) for  at least 6 months had thicker hair than people who did not. Minoxidil topical (Rogaine) only works as long as it continues to be used. If if it is no longer used then the hair it has been helping to regrow can fall out. Minoxidil topical (Rogaine) can cause increased facial hair growth which can usually be managed easily with a battery-operated hair trimmer. If facial hair  growth is bothersome, switching to the 2% women's version can decrease the risk of unwanted facial hair growth.  Recommend Red Light treatment - information provided at visit.  May consider propecia in future if not seeing any improvement.   CBC with Differential/Platelets - Scalp CMP - Scalp TSH - Scalp  Nevus (2) left dorsal 5th finger; left rim of ear Recheck at next follow up See Photo Benign-appearing.  Recommend observation for change.  Call clinic for appointment for evaluation of any new or changing lesions.   Lentigines - Scattered tan macules - Due to sun exposure - Benign-appearing, observe - Recommend daily broad spectrum sunscreen SPF 30+ to sun-exposed areas, reapply every 2 hours as needed. - Call for any changes  Seborrheic Keratoses - Stuck-on, waxy, tan-brown papules and/or plaques  - Benign-appearing - Discussed benign etiology and prognosis. - Observe - Call for any changes  Melanocytic Nevi - Tan-brown and/or pink-flesh-colored symmetric macules and papules - Benign appearing on exam today - Observation - Call clinic for new or changing moles - Recommend daily use of broad spectrum spf 30+ sunscreen to sun-exposed areas.   Hemangiomas - Red papules - Discussed benign nature - Observe - Call for any changes  Actinic Damage - Chronic condition, secondary to cumulative UV/sun exposure - diffuse scaly erythematous macules with underlying dyspigmentation - Recommend daily broad spectrum sunscreen SPF 30+ to sun-exposed areas, reapply every 2 hours as needed.  - Staying in the shade or wearing long sleeves, sun glasses (UVA+UVB protection) and wide brim hats (4-inch brim around the entire circumference of the hat) are also recommended for sun protection.  - Call for new or changing lesions.  Family history of skin cancer - what type(s): BCC  - who affected: mother  Skin cancer screening performed today. Return in about 1 year (around 06/10/2023)  for TBSE.  IRuthell Rummage, CMA, am acting as scribe for Sarina Ser, MD. Documentation: I have reviewed the above documentation for accuracy and completeness, and I agree with the above.  Sarina Ser, MD

## 2022-06-13 ENCOUNTER — Encounter: Payer: Self-pay | Admitting: Dermatology

## 2022-06-15 ENCOUNTER — Other Ambulatory Visit: Payer: Self-pay | Admitting: Family Medicine

## 2022-06-15 DIAGNOSIS — F32A Depression, unspecified: Secondary | ICD-10-CM

## 2022-06-15 NOTE — Telephone Encounter (Signed)
Requested medication (s) are due for refill todayyes  Requested medication (s) are on the active medication list: yes  Last refill:  09/29/20  Future visit scheduled: yes  Notes to clinic:  Unable to refill per protocol, cannot delegate.      Requested Prescriptions  Pending Prescriptions Disp Refills   ondansetron (ZOFRAN-ODT) 4 MG disintegrating tablet [Pharmacy Med Name: ONDANSETRON ODT 4 MG TABLET] 30 tablet 5    Sig: DISSOLVE 1 TABLET IN MOUTH EVERY 8 HOURS FOR NAUSEA AND VOMITING     Not Delegated - Gastroenterology: Antiemetics - ondansetron Failed - 06/15/2022  1:20 PM      Failed - This refill cannot be delegated      Passed - AST in normal range and within 360 days    AST  Date Value Ref Range Status  10/04/2021 41 15 - 41 U/L Final   SGOT(AST)  Date Value Ref Range Status  02/19/2014 20 15 - 37 Unit/L Final         Passed - ALT in normal range and within 360 days    ALT  Date Value Ref Range Status  10/04/2021 30 0 - 44 U/L Final   SGPT (ALT)  Date Value Ref Range Status  02/19/2014 27 12 - 78 U/L Final         Passed - Valid encounter within last 6 months    Recent Outpatient Visits           1 month ago Tinea pedis of right foot   [facility] [place], [place], PA-C   1 month ago Need for influenza vaccination   [place], [place]   2 months ago Pain in toes of both feet   [facility] Birdie Sons, MD   5 months ago Chronic bilateral back pain, unspecified back location   [facility] Birdie Sons, MD   6 months ago DDD (degenerative disc disease), lumbar   [facility] Birdie Sons, MD       Future Appointments             In 4 weeks [place], Gordan Payment [place]   In 6 months Ralene Bathe, MD [place]   In 1 year Ralene Bathe, MD [place]

## 2022-06-17 ENCOUNTER — Telehealth: Payer: Self-pay

## 2022-06-17 NOTE — Telephone Encounter (Signed)
  [facility] pharmacy sent a fax requesting a 90 day supply of the Olanzapine. Please advise    OLANZapine (ZYPREXA) 15 MG tablet 30 tablet 2 05/23/2022 08/21/2022   Sig - Route: Take 1 tablet (15 mg total) by mouth at bedtime. - Karin Lieu

## 2022-06-20 ENCOUNTER — Other Ambulatory Visit: Payer: Self-pay | Admitting: Psychiatry

## 2022-06-20 MED ORDER — OLANZAPINE 15 MG PO TABS: 15.0000 mg | ORAL_TABLET | Freq: Every day | ORAL | 2 refills | Status: DC

## 2022-06-21 DIAGNOSIS — F331 Major depressive disorder, recurrent, moderate: Secondary | ICD-10-CM | POA: Diagnosis not present

## 2022-06-21 DIAGNOSIS — F411 Generalized anxiety disorder: Secondary | ICD-10-CM | POA: Diagnosis not present

## 2022-06-24 ENCOUNTER — Other Ambulatory Visit: Payer: Self-pay | Admitting: Psychiatry

## 2022-06-24 DIAGNOSIS — R200 Anesthesia of skin: Secondary | ICD-10-CM

## 2022-06-27 ENCOUNTER — Encounter
Admission: RE | Admit: 2022-06-27 | Discharge: 2022-06-27 | Disposition: A | Discharge status: Home / Self Care | Payer: Medicare Other | Source: Ambulatory Visit | Attending: Psychiatry | Admitting: Psychiatry

## 2022-06-27 ENCOUNTER — Ambulatory Visit: Payer: Self-pay | Admitting: Certified Registered Nurse Anesthetist

## 2022-06-27 ENCOUNTER — Encounter: Payer: Self-pay | Admitting: Certified Registered Nurse Anesthetist

## 2022-06-27 VITALS — BP 118/82 | HR 77 | Temp 97.6°F | Resp 17 | Ht 59.0 in | Wt 122.0 lb

## 2022-06-27 DIAGNOSIS — J4530 Mild persistent asthma, uncomplicated: Secondary | ICD-10-CM | POA: Diagnosis not present

## 2022-06-27 DIAGNOSIS — T7840XA Allergy, unspecified, initial encounter: Secondary | ICD-10-CM | POA: Diagnosis not present

## 2022-06-27 DIAGNOSIS — F333 Major depressive disorder, recurrent, severe with psychotic symptoms: Secondary | ICD-10-CM | POA: Diagnosis not present

## 2022-06-27 DIAGNOSIS — R200 Anesthesia of skin: Secondary | ICD-10-CM | POA: Diagnosis not present

## 2022-06-27 DIAGNOSIS — R569 Unspecified convulsions: Secondary | ICD-10-CM | POA: Diagnosis not present

## 2022-06-27 DIAGNOSIS — F418 Other specified anxiety disorders: Secondary | ICD-10-CM | POA: Diagnosis not present

## 2022-06-27 LAB — URINALYSIS, COMPLETE (UACMP) WITH MICROSCOPIC
Bilirubin Urine: NEGATIVE
Glucose, UA: NEGATIVE mg/dL
Hgb urine dipstick: NEGATIVE
Ketones, ur: NEGATIVE mg/dL
Nitrite: NEGATIVE
Protein, ur: NEGATIVE mg/dL
Specific Gravity, Urine: 1.011 (ref 1.005–1.030)
pH: 7 (ref 5.0–8.0)

## 2022-06-27 LAB — GLUCOSE, CAPILLARY
Glucose-Capillary: 121 mg/dL — ABNORMAL HIGH (ref 70–99)
Glucose-Capillary: 99 mg/dL (ref 70–99)

## 2022-06-27 MED ORDER — MIDAZOLAM HCL 2 MG/2ML IJ SOLN
INTRAMUSCULAR | Status: DC | PRN
  Administered 2022-06-27: 2 mg via INTRAVENOUS

## 2022-06-27 MED ORDER — FENTANYL CITRATE (PF) 100 MCG/2ML IJ SOLN: 25.0000 ug | INTRAMUSCULAR | Status: DC | PRN

## 2022-06-27 MED ORDER — MIDAZOLAM HCL 2 MG/2ML IJ SOLN: 2.0000 mg | Freq: Once | INTRAMUSCULAR | Status: DC

## 2022-06-27 MED ORDER — LURASIDONE HCL 80 MG PO TABS: 80.0000 mg | ORAL_TABLET | Freq: Every day | ORAL | 1 refills | Status: DC

## 2022-06-27 MED ORDER — LABETALOL HCL 5 MG/ML IV SOLN
INTRAVENOUS | Status: DC | PRN
  Administered 2022-06-27: 5 mg via INTRAVENOUS

## 2022-06-27 MED ORDER — SODIUM CHLORIDE 0.9 % IV SOLN: 500.0000 mL | Freq: Once | INTRAVENOUS | Status: AC

## 2022-06-27 MED ORDER — MIDAZOLAM HCL 2 MG/2ML IJ SOLN
INTRAMUSCULAR | Status: AC
  Filled 2022-06-27: qty 2

## 2022-06-27 MED ORDER — ONDANSETRON HCL 4 MG/2ML IJ SOLN: 4.0000 mg | Freq: Once | INTRAMUSCULAR | Status: DC | PRN

## 2022-06-27 MED ORDER — LABETALOL HCL 5 MG/ML IV SOLN
INTRAVENOUS | Status: AC
  Filled 2022-06-27: qty 4

## 2022-06-27 MED ORDER — SUCCINYLCHOLINE CHLORIDE 200 MG/10ML IV SOSY
PREFILLED_SYRINGE | INTRAVENOUS | Status: DC | PRN
  Administered 2022-06-27: 60 mg via INTRAVENOUS

## 2022-06-27 MED ORDER — SUCCINYLCHOLINE CHLORIDE 200 MG/10ML IV SOSY
PREFILLED_SYRINGE | INTRAVENOUS | Status: AC
  Filled 2022-06-27: qty 10

## 2022-06-27 MED ORDER — METHOHEXITAL SODIUM 100 MG/10ML IV SOSY
PREFILLED_SYRINGE | INTRAVENOUS | Status: DC | PRN
  Administered 2022-06-27: 70 mg via INTRAVENOUS

## 2022-06-27 MED ORDER — METHOHEXITAL SODIUM 0.5 G IJ SOLR
INTRAMUSCULAR | Status: AC
  Filled 2022-06-27: qty 500

## 2022-06-27 NOTE — Transfer of Care (Signed)
Immediate Anesthesia Transfer of Care Note  Patient: Karen [name]  Procedure(s) Performed: ECT TX  Patient Location: PACU  Anesthesia Type:General  Level of Consciousness: awake, alert , and oriented  Airway & Oxygen Therapy: Patient Spontanous Breathing  Post-op Assessment: Report given to RN and Post -op Vital signs reviewed and stable  Post vital signs: Reviewed and stable  Last Vitals:  Vitals Value Taken Time  BP    Temp    Pulse    Resp    SpO2      Last Pain:  Vitals:   06/27/22 1133  TempSrc:   PainSc: 0-No pain         Complications: No notable events documented.

## 2022-06-27 NOTE — H&P (Signed)
Karen [name] is an 46 y.o. female.   Chief Complaint: Mood still a little depressed but better.  No hallucinations currently.  Gained a lot of weight with the olanzapine HPI: Recurrent depression with psychotic symptoms  Past Medical History:  Diagnosis Date   Ehlers-Danlos syndrome    Fibromyalgia    Headache    Polyp of descending colon    Sleep apnea    Tics of organic origin    Transient alteration of awareness     Past Surgical History:  Procedure Laterality Date   COLONOSCOPY WITH PROPOFOL N/A 02/13/2018   Procedure: COLONOSCOPY WITH PROPOFOL;  Surgeon: [name], MD;  Location: [facility] ENDOSCOPY;  Service: Endoscopy;  Laterality: N/A;   COLONOSCOPY WITH PROPOFOL N/A 01/11/2022   Procedure: COLONOSCOPY WITH PROPOFOL;  Surgeon: [name], MD;  Location: [facility] ENDOSCOPY;  Service: Endoscopy;  Laterality: N/A;   DILATION AND CURETTAGE OF UTERUS  08/16/2007   ESOPHAGOGASTRODUODENOSCOPY (EGD) WITH PROPOFOL  02/13/2018   Procedure: ESOPHAGOGASTRODUODENOSCOPY (EGD) WITH PROPOFOL;  Surgeon: [name], MD;  Location: [place] ENDOSCOPY;  Service: Endoscopy;;   EYE SURGERY Left 08/15/1988   3 Surgeries on left eye to correct crossed eye   LAPAROSCOPY Left 08/15/2001   Fallopian Tube   MANDIBLE SURGERY  08/15/1996   OTHER SURGICAL HISTORY Right 08/15/1985   3 Surgeries to repair broken arm   OTHER SURGICAL HISTORY Left    3 surgeries on her left thigh as an infant   OTHER SURGICAL HISTORY  08/15/1996   Jaw surgery   Right arm surgery  08/15/1985   x 3 due to fracture   TOENAIL EXCISION Bilateral 06/13/2019   Procedure: BILATERAL SECOND TOE PARTIAL NAIL ABLATION;  Surgeon: Erle Crocker, MD;  Location: [place];  Service: Orthopedics;  Laterality: Bilateral;  SURGERY REQUEST TIME 1 HOUR   TONSILLECTOMY  12/13/2002   TONSILLECTOMY      Family History  Problem Relation Age of Onset   Depression Mother    Stroke Mother    Fibromyalgia Mother     Osteoarthritis Mother    Asthma Brother    Depression Brother    Migraines Brother    Asperger's syndrome Brother    Other Brother        Ehlers-Danlos Syndrome   Cancer Maternal Aunt    Asperger's syndrome Maternal Aunt    Breast cancer Maternal Aunt    Heart disease Paternal Aunt    Heart disease Paternal Uncle    Cervical cancer Maternal Grandmother    Osteoporosis Maternal Grandmother        Died at 110   Osteoarthritis Maternal Grandmother    Colon cancer Maternal Grandfather    Asthma Maternal Grandfather    Asperger's syndrome Maternal Grandfather    Emphysema Maternal Grandfather        Died at 41   Prostate cancer Maternal Grandfather    Heart attack Paternal Grandfather        Died at 47   Asthma Paternal Grandfather    Tuberculosis Paternal Grandfather    Cancer Cousin    Asperger's syndrome Cousin    Asperger's syndrome Other    Heart Problems Paternal Grandmother        Died at 27   Ehlers-Danlos syndrome Father    Ehlers-Danlos syndrome Brother    Social History:  reports that she has never smoked. She has never used smokeless tobacco. She reports that she does not drink alcohol and does not use drugs.  Allergies:  Allergies  Allergen Reactions   Augmentin  [Amoxicillin-Pot Clavulanate] Diarrhea   Chocolate Flavor     Other reaction(s): Other (See Comments) Wheezing, acne   Demerol [Meperidine] Other (See Comments)    Slow to wake up when this drug is given.    Keflex [Cephalexin] Nausea And Vomiting   Morphine And Related Nausea And Vomiting   Tape Dermatitis    Must use paper tape only   Toradol [Ketorolac Tromethamine] Nausea And Vomiting   Amoxicillin     Other reaction(s): Unknown   Chocolate     GI distress   Nsaids     patient develops ulcers Other reaction(s): Other (See Comments) patient develops ulcers   Strawberry Extract     GI distress    (Not in a hospital admission)   Results for orders placed or performed during the  hospital encounter of 06/27/22 (from the past 48 hour(s))  Glucose, capillary     Status: None   Collection Time: 06/27/22  1:52 PM  Result Value Ref Range   Glucose-Capillary 99 70 - 99 mg/dL    Comment: Glucose reference range applies only to samples taken after fasting for at least 8 hours.  Urinalysis, Complete w Microscopic Urine, Clean Catch     Status: Abnormal   Collection Time: 06/27/22  2:06 PM  Result Value Ref Range   Color, Urine YELLOW (A) YELLOW   APPearance HAZY (A) CLEAR   Specific Gravity, Urine 1.011 1.005 - 1.030   pH 7.0 5.0 - 8.0   Glucose, UA NEGATIVE NEGATIVE mg/dL   Hgb urine dipstick NEGATIVE NEGATIVE   Bilirubin Urine NEGATIVE NEGATIVE   Ketones, ur NEGATIVE NEGATIVE mg/dL   Protein, ur NEGATIVE NEGATIVE mg/dL   Nitrite NEGATIVE NEGATIVE   Leukocytes,Ua TRACE (A) NEGATIVE   RBC / HPF 0-5 0 - 5 RBC/hpf   WBC, UA 11-20 0 - 5 WBC/hpf   Bacteria, UA RARE (A) NONE SEEN   Squamous Epithelial / LPF 0-5 0 - 5    Comment: Performed at [facility], [address] [place], [place] [address]  Glucose, capillary     Status: Abnormal   Collection Time: 06/27/22  2:08 PM  Result Value Ref Range   Glucose-Capillary 121 (H) 70 - 99 mg/dL    Comment: Glucose reference range applies only to samples taken after fasting for at least 8 hours.   No results found.  Review of Systems  Constitutional: Negative.   HENT: Negative.    Eyes: Negative.   Respiratory: Negative.    Cardiovascular: Negative.   Gastrointestinal: Negative.   Musculoskeletal: Negative.   Skin: Negative.   Neurological: Negative.   Psychiatric/Behavioral: Negative.      Blood pressure 118/82, pulse 77, temperature 97.6 F (36.4 C), temperature source Temporal, resp. rate 17, height '4\' 11"'$  (1.499 m), weight 55.3 kg, SpO2 97 %. Physical Exam Constitutional:      Appearance: She is well-developed.  HENT:     Head: Normocephalic and atraumatic.  Eyes:     Conjunctiva/sclera:  Conjunctivae normal.     Pupils: Pupils are equal, round, and reactive to light.  Cardiovascular:     Heart sounds: Normal heart sounds.  Pulmonary:     Effort: Pulmonary effort is normal.  Abdominal:     Palpations: Abdomen is soft.  Musculoskeletal:        General: Normal range of motion.     Cervical back: Normal range of motion.  Skin:    General: Skin is  warm and dry.  Neurological:     General: No focal deficit present.     Mental Status: She is alert.  Psychiatric:        Attention and Perception: She does not perceive auditory hallucinations.        Mood and Affect: Mood is depressed.        Speech: Speech is delayed.        Behavior: Behavior is slowed.      Assessment/Plan Maintenance ECT seems to be helpers to see her back in 3 weeks.  Wants to discontinue the Zyprexa because of weight gain.  Discontinue Zyprexa and replace it with Latuda 80 mg prescription is completed  Alethia Berthold, MD 06/27/2022, 5:24 PM

## 2022-06-27 NOTE — Procedures (Signed)
ECT SERVICES Physician's Interval Evaluation & Treatment Note  Patient Identification: Karen [name] MRN:  [id number] Date of Evaluation:  06/27/2022 TX #: [id number]  MADRS:   MMSE:   P.E. Findings:  Patient reports weight gain.  No change to obvious physical exam  Psychiatric Interval Note:  Mood is good hallucinations under control  Subjective:  Patient is a 46 y.o. female seen for evaluation for Electroconvulsive Therapy. Only complaint is the weight  Treatment Summary:   '[]'$   Right Unilateral             '[x]'$  Bilateral   % Energy : 1.0 ms 40%   Impedance: 1990 ohms  Seizure Energy Index: 10,000 V squared  Postictal Suppression Index: 40%  Seizure Concordance Index: 85%  Medications  Pre Shock: Labetalol 20 mg Brevital 60 mg succinylcholine 60 mg  Post Shock: Versed 2 mg  Seizure Duration: EMG 37 seconds EEG 65 seconds   Comments: Follow-up in 3 weeks  Lungs:  '[x]'$   Clear to auscultation               '[]'$  Other:   Heart:    '[x]'$   Regular rhythm             '[]'$  irregular rhythm    '[x]'$   Previous H&P reviewed, patient examined and there are NO CHANGES                 '[]'$   Previous H&P reviewed, patient examined and there are changes noted.   Alethia Berthold, MD 11/13/20235:29 PM

## 2022-06-27 NOTE — Anesthesia Preprocedure Evaluation (Signed)
Anesthesia Evaluation  Patient identified by MRN, date of birth, ID band Patient awake    Reviewed: Allergy & Precautions, H&P , NPO status , Patient's Chart, lab work & pertinent test results, reviewed documented beta blocker date and time   Airway Mallampati: II   Neck ROM: full    Dental  (+) Poor Dentition   Pulmonary asthma , sleep apnea    Pulmonary exam normal        Cardiovascular negative cardio ROS Normal cardiovascular exam Rhythm:regular Rate:Normal     Neuro/Psych  Headaches, Seizures -,  PSYCHIATRIC DISORDERS Anxiety Depression     Neuromuscular disease    GI/Hepatic negative GI ROS, Neg liver ROS,,,  Endo/Other  negative endocrine ROS    Renal/GU negative Renal ROS  negative genitourinary   Musculoskeletal   Abdominal   Peds  Hematology negative hematology ROS (+)   Anesthesia Other Findings Past Medical History: No date: Ehlers-Danlos syndrome No date: Fibromyalgia No date: Headache No date: Polyp of descending colon No date: Sleep apnea No date: Tics of organic origin No date: Transient alteration of awareness Past Surgical History: 02/13/2018: COLONOSCOPY WITH PROPOFOL; N/A     Comment:  Procedure: COLONOSCOPY WITH PROPOFOL;  Surgeon: [name], MD;  Location: [facility] ENDOSCOPY;  Service:               Endoscopy;  Laterality: N/A; 01/11/2022: COLONOSCOPY WITH PROPOFOL; N/A     Comment:  Procedure: COLONOSCOPY WITH PROPOFOL;  Surgeon: [name], MD;  Location: [facility] ENDOSCOPY;  Service:               Endoscopy;  Laterality: N/A; 08/16/2007: DILATION AND CURETTAGE OF UTERUS 02/13/2018: ESOPHAGOGASTRODUODENOSCOPY (EGD) WITH PROPOFOL     Comment:  Procedure: ESOPHAGOGASTRODUODENOSCOPY (EGD) WITH               PROPOFOL;  Surgeon: [name], MD;  Location: [facility]               ENDOSCOPY;  Service: Endoscopy;; 08/15/1988: EYE SURGERY; Left     Comment:  3 Surgeries  on left eye to correct crossed eye 08/15/2001: LAPAROSCOPY; Left     Comment:  Fallopian Tube 08/15/1996: MANDIBLE SURGERY 08/15/1985: OTHER SURGICAL HISTORY; Right     Comment:  3 Surgeries to repair broken arm No date: OTHER SURGICAL HISTORY; Left     Comment:  3 surgeries on her left thigh as an infant 08/15/1996: OTHER SURGICAL HISTORY     Comment:  Jaw surgery 08/15/1985: Right arm surgery     Comment:  x 3 due to fracture 06/13/2019: TOENAIL EXCISION; Bilateral     Comment:  Procedure: BILATERAL SECOND TOE PARTIAL NAIL ABLATION;                Surgeon: Erle Crocker, MD;  Location: [place];  Service: Orthopedics;  Laterality:               Bilateral;  SURGERY REQUEST TIME 1 HOUR 12/13/2002: TONSILLECTOMY No date: TONSILLECTOMY BMI    Body Mass Index: 24.64 kg/m     Reproductive/Obstetrics negative OB ROS  Anesthesia Physical Anesthesia Plan  ASA: 4  Anesthesia Plan: General   Post-op Pain Management:    Induction:   PONV Risk Score and Plan:   Airway Management Planned:   Additional Equipment:   Intra-op Plan:   Post-operative Plan:   Informed Consent: I have reviewed the patients History and Physical, chart, labs and discussed the procedure including the risks, benefits and alternatives for the proposed anesthesia with the patient or authorized representative who has indicated his/her understanding and acceptance.     Dental Advisory Given  Plan Discussed with: CRNA  Anesthesia Plan Comments:        Anesthesia Quick Evaluation

## 2022-06-28 NOTE — Anesthesia Postprocedure Evaluation (Signed)
Anesthesia Post Note  Patient: Karen [name]  Procedure(s) Performed: ECT TX  Patient location during evaluation: PACU Anesthesia Type: General Level of consciousness: awake and alert Pain management: pain level controlled Vital Signs Assessment: post-procedure vital signs reviewed and stable Respiratory status: spontaneous breathing, nonlabored ventilation, respiratory function stable and patient connected to nasal cannula oxygen Cardiovascular status: blood pressure returned to baseline and stable Postop Assessment: no apparent nausea or vomiting Anesthetic complications: no   No notable events documented.   Last Vitals:  Vitals:   06/27/22 1430 06/27/22 1445  BP: 107/72 118/82  Pulse: 77 77  Resp: 17 17  Temp: 36.6 C 36.4 C  SpO2: 97%     Last Pain:  Vitals:   06/27/22 1445  TempSrc: Temporal  PainSc:                  [name]

## 2022-06-30 ENCOUNTER — Other Ambulatory Visit: Payer: Self-pay | Admitting: Family Medicine

## 2022-06-30 DIAGNOSIS — G8929 Other chronic pain: Secondary | ICD-10-CM

## 2022-06-30 NOTE — Telephone Encounter (Signed)
Requested medication (s) are due for refill today: yes  Requested medication (s) are on the active medication list: yes    Last refill:   05/30/22    #10    0 refills  Future visit scheduled  yes 07/05/22  Notes to clinic:  Not delegated, please review. Thank you.  Requested Prescriptions  Pending Prescriptions Disp Refills   fentaNYL (DURAGESIC) 75 MCG/HR 10 patch 0    Sig: Place 1 patch onto the skin every 3 (three) days.     Not Delegated - Analgesics:  Opioid Agonists Failed - 06/30/2022  3:32 PM      Failed - This refill cannot be delegated      Passed - Urine Drug Screen completed in last 360 days      Passed - Valid encounter within last 3 months    Recent Outpatient Visits           1 month ago Tinea pedis of right foot   [facility] [place], [place], PA-C   1 month ago Need for influenza vaccination   [facility] Birdie Sons, MD   3 months ago Pain in toes of both feet   [facility] Birdie Sons, MD   5 months ago Chronic bilateral back pain, unspecified back location   [facility] Birdie Sons, MD   6 months ago DDD (degenerative disc disease), lumbar   [facility] Birdie Sons, MD       Future Appointments             In 5 days Fisher, Kirstie Peri, MD [facility]   In 2 weeks McGowan, Gordan Payment [facility] Urology [place]   In 5 months Ralene Bathe, MD [place]   In 11 months Ralene Bathe, MD [place]

## 2022-06-30 NOTE — Telephone Encounter (Signed)
Requested Prescriptions  Pending Prescriptions Disp Refills   celecoxib (CELEBREX) 200 MG capsule [Pharmacy Med Name: CELECOXIB 200 MG CAPSULE] 60 capsule 5    Sig: TAKE 1 CAPSULE BY MOUTH TWICE A DAY     Analgesics:  COX2 Inhibitors Failed - 06/30/2022  2:04 AM      Failed - Manual Review: Labs are only required if the patient has taken medication for more than 8 weeks.      Passed - HGB in normal range and within 360 days    Hemoglobin  Date Value Ref Range Status  10/15/2021 13.8 12.0 - 15.0 g/dL Final  10/15/2019 12.8 11.1 - 15.9 g/dL Final         Passed - Cr in normal range and within 360 days    Creatinine  Date Value Ref Range Status  02/19/2014 0.82 0.60 - 1.30 mg/dL Final   Creatinine, Ser  Date Value Ref Range Status  10/26/2021 0.63 0.44 - 1.00 mg/dL Final         Passed - HCT in normal range and within 360 days    HCT  Date Value Ref Range Status  10/15/2021 42.2 36.0 - 46.0 % Final   Hematocrit  Date Value Ref Range Status  10/15/2019 37.9 34.0 - 46.6 % Final         Passed - AST in normal range and within 360 days    AST  Date Value Ref Range Status  10/04/2021 41 15 - 41 U/L Final   SGOT(AST)  Date Value Ref Range Status  02/19/2014 20 15 - 37 Unit/L Final         Passed - ALT in normal range and within 360 days    ALT  Date Value Ref Range Status  10/04/2021 30 0 - 44 U/L Final   SGPT (ALT)  Date Value Ref Range Status  02/19/2014 27 12 - 78 U/L Final         Passed - eGFR is 30 or above and within 360 days    EGFR (African American)  Date Value Ref Range Status  02/19/2014 >60  Final   GFR calc Af Amer  Date Value Ref Range Status  10/15/2019 80 >59 mL/min/1.73 Final   EGFR (Non-African Amer.)  Date Value Ref Range Status  02/19/2014 >60  Final    Comment:    eGFR values <65m/min/1.73 m2 may be an indication of chronic kidney disease (CKD). Calculated eGFR is useful in patients with stable renal function. The eGFR calculation  will not be reliable in acutely ill patients when serum creatinine is changing rapidly. It is not useful in  patients on dialysis. The eGFR calculation may not be applicable to patients at the low and high extremes of body sizes, pregnant women, and vegetarians.    GFR, Estimated  Date Value Ref Range Status  10/26/2021 >60 >60 mL/min Final    Comment:    (NOTE) Calculated using the CKD-EPI Creatinine Equation (2021)          Passed - Patient is not pregnant      Passed - Valid encounter within last 12 months    Recent Outpatient Visits           1 month ago Tinea pedis of right foot   [place]   1 month ago Need for influenza vaccination   [facility] MD   3 months ago Pain in toes of both feet  [place], MD   5 months ago Chronic bilateral back pain, unspecified back location   [place], Kirstie Peri, MD   6 months ago DDD (degenerative disc disease), lumbar   [facility] Birdie Sons, MD       Future Appointments             In 5 days Fisher, Kirstie Peri, MD [facility]   In 2 weeks McGowan, Gordan Payment [facility] [place]   In 5 months Ralene Bathe, MD [place]   In 11 months Ralene Bathe, MD [place]

## 2022-06-30 NOTE — Telephone Encounter (Signed)
Medication Refill - Medication: fentaNYL ([place]) 75 MCG/HR   Has the patient contacted their pharmacy? No.  Preferred Pharmacy (with phone number or street name):  [id number] [place] [place]: [phone] Fax: [phone]    Has the patient been seen for an appointment in the last year OR does the patient have an upcoming appointment? Yes.    Agent: Please be advised that RX refills may take up to 3 business days. We ask that you follow-up with your pharmacy.

## 2022-07-01 MED ORDER — FENTANYL 75 MCG/HR TD PT72: 1.0000 | MEDICATED_PATCH | TRANSDERMAL | 0 refills | Status: DC

## 2022-07-05 ENCOUNTER — Encounter: Payer: Self-pay | Admitting: Family Medicine

## 2022-07-05 ENCOUNTER — Ambulatory Visit (INDEPENDENT_AMBULATORY_CARE_PROVIDER_SITE_OTHER): Payer: Medicare Other | Admitting: Family Medicine

## 2022-07-05 VITALS — BP 95/81 | HR 94 | Temp 98.4°F | Resp 14

## 2022-07-05 DIAGNOSIS — M069 Rheumatoid arthritis, unspecified: Secondary | ICD-10-CM | POA: Diagnosis not present

## 2022-07-05 DIAGNOSIS — F411 Generalized anxiety disorder: Secondary | ICD-10-CM | POA: Diagnosis not present

## 2022-07-05 DIAGNOSIS — Q796 Ehlers-Danlos syndrome, unspecified: Secondary | ICD-10-CM

## 2022-07-05 DIAGNOSIS — G40909 Epilepsy, unspecified, not intractable, without status epilepticus: Secondary | ICD-10-CM

## 2022-07-05 DIAGNOSIS — F331 Major depressive disorder, recurrent, moderate: Secondary | ICD-10-CM | POA: Diagnosis not present

## 2022-07-05 DIAGNOSIS — G2569 Other tics of organic origin: Secondary | ICD-10-CM | POA: Diagnosis not present

## 2022-07-05 NOTE — Progress Notes (Unsigned)
I,Roshena L Chambers,acting as a scribe for Lelon Huh, MD.,have documented all relevant documentation on the behalf of Lelon Huh, MD,as directed by  Lelon Huh, MD while in the presence of Lelon Huh, MD.   Established patient visit   Patient: Karen [name]   DOB: 08-Jul-1976   46 y.o. Female  MRN: [id number] Visit Date: 07/05/2022  Today's healthcare provider: Lelon Huh, MD   Chief Complaint  Patient presents with   Follow-up   Subjective    HPI  Patient is here today to discuss the need for a new power wheelchair. She has a long history of severely limited mobility due to weakness of extremities secondary to [facility] Danlos syndrome and arthralgia secondary to rheumatoid arthritis. She is only able to stand from a seated position to transfer from chair to bed and back. Is not able to walk more than a few steps. Her upper extremities are weak making it impossible to operate a manual wheelchair. She has required a motorized wheelchair for many years and her current once is seven or 46 years old with many pieces worn and fallen off.   Medications: Outpatient Medications Prior to Visit  Medication Sig   albuterol (VENTOLIN HFA) 108 (90 Base) MCG/ACT inhaler Inhale 2 puffs into the lungs every 6 (six) hours as needed.   busPIRone (BUSPAR) 10 MG tablet TAKE 1 TABLET (10 MG TOTAL) BY MOUTH 2 (TWO) TIMES DAILY. TAKE ALONG WITH 15MG TABLET TWICE A DAY   busPIRone (BUSPAR) 15 MG tablet TAKE 1 TABLET BY MOUTH TWICE A DAY   celecoxib (CELEBREX) 200 MG capsule TAKE 1 CAPSULE BY MOUTH TWICE A DAY   Ciclopirox 8 % KIT Apply 1 application  topically daily.   diclofenac Sodium (VOLTAREN) 1 % GEL APPLY AS NEEDED 3 TIMES A DAY   DULoxetine (CYMBALTA) 60 MG capsule Take 2 capsules (120 mg total) by mouth daily.   fentaNYL (DURAGESIC) 75 MCG/HR Place 1 patch onto the skin every 3 (three) days.   fexofenadine (ALLEGRA) 180 MG tablet Take 180 mg by mouth daily.   fluticasone  (FLONASE) 50 MCG/ACT nasal spray    Fluticasone-Salmeterol (ADVAIR) 250-50 MCG/DOSE AEPB Inhale 1 puff into the lungs every 12 (twelve) hours. Rinse mouth after use   gabapentin (NEURONTIN) 800 MG tablet TAKE 1 TABLET BY MOUTH 3 TIMES DAILY.   haloperidol (HALDOL) 0.5 MG tablet TAKE 1/2 OF A TABLET (0.25 MG TOTAL) BY MOUTH TWICE A DAY   lurasidone (LATUDA) 80 MG TABS tablet Take 1 tablet (80 mg total) by mouth daily with supper.   Na Sulfate-K Sulfate-Mg Sulf 17.5-3.13-1.6 GM/177ML SOLN See admin instructions.   naloxone (NARCAN) nasal spray 4 mg/0.1 mL Place 1 spray into the nose as needed.   omeprazole (PRILOSEC) 40 MG capsule Take 1 capsule (40 mg total) by mouth 2 (two) times daily. SCHEDULE OFFICE VISIT   ondansetron (ZOFRAN-ODT) 4 MG disintegrating tablet DISSOLVE 1 TABLET IN MOUTH EVERY 8 HOURS FOR NAUSEA AND VOMITING   QUEtiapine (SEROQUEL) 25 MG tablet Take 1 tablet (25 mg total) by mouth at bedtime.   trimethoprim-polymyxin b (POLYTRIM) ophthalmic solution Place 1 drop into both eyes every 6 (six) hours.   tiZANidine (ZANAFLEX) 4 MG tablet TAKE 0.5-1 TABLETS (2-4 MG TOTAL) BY MOUTH EVERY 8 (EIGHT) HOURS AS NEEDED FOR MUSCLE SPASMS.   No facility-administered medications prior to visit.    Review of Systems  Constitutional:  Negative for appetite change, chills, fatigue and fever.  Respiratory:  Negative  for chest tightness and shortness of breath.   Cardiovascular:  Negative for chest pain and palpitations.  Gastrointestinal:  Negative for abdominal pain, nausea and vomiting.  Neurological:  Negative for dizziness and weakness.       Objective    BP 95/81 (BP Location: Left Arm, Patient Position: Sitting, Cuff Size: Large)   Pulse 94   Temp 98.4 F (36.9 C) (Oral)   Resp 14   SpO2 100% Comment: room air   Physical Exam   General: Appearance:    Well developed, well nourished female in no acute distress  Eyes:    PERRL, conjunctiva/corneas clear, EOM's intact        Lungs:     Clear to auscultation bilaterally, respirations unlabored  Heart:    Normal heart rate. Normal rhythm. No murmurs, rubs, or gallops.    MS:   All extremities are intact.  +3/5 strength Ues, +2 Les   Neurologic:   Awake, alert, oriented x 3.         Assessment & Plan     1. Ehlers-Danlos syndrome   2. Rheumatoid arthritis, involving unspecified site, unspecified whether rheumatoid factor present ([place])   3. Seizure disorder ([place])  Patient has severely limited mobility due to weakness and pain secondary to Ehlers-danlos syndrome and  rheumatoid arthritis. She is not able to ambulate and lacks upper extremity strength required to operate a manual wheelchair. Her current motorized wheelchair is worn and occasionally inoperable and she requires PT evaluation for replacement motorized wheelchair.   She uses [employer] [phone] (f)     The entirety of the information documented in the History of Present Illness, Review of Systems and Physical Exam were personally obtained by me. Portions of this information were initially documented by the CMA and reviewed by me for thoroughness and accuracy.     Lelon Huh, MD  [facility] [phone] (phone) [phone] (fax)  [place]

## 2022-07-05 NOTE — Patient Instructions (Signed)
.   Please review the attached list of medications and notify my office if there are any errors.   . Please bring all of your medications to every appointment so we can make sure that our medication list is the same as yours.   

## 2022-07-10 ENCOUNTER — Emergency Department: Payer: Medicare Other

## 2022-07-10 ENCOUNTER — Encounter: Payer: Self-pay | Admitting: Emergency Medicine

## 2022-07-10 DIAGNOSIS — I959 Hypotension, unspecified: Secondary | ICD-10-CM | POA: Diagnosis not present

## 2022-07-10 DIAGNOSIS — R4182 Altered mental status, unspecified: Secondary | ICD-10-CM | POA: Diagnosis not present

## 2022-07-10 DIAGNOSIS — S060X0A Concussion without loss of consciousness, initial encounter: Secondary | ICD-10-CM | POA: Diagnosis not present

## 2022-07-10 DIAGNOSIS — W19XXXA Unspecified fall, initial encounter: Secondary | ICD-10-CM | POA: Diagnosis not present

## 2022-07-10 DIAGNOSIS — R0689 Other abnormalities of breathing: Secondary | ICD-10-CM | POA: Diagnosis not present

## 2022-07-10 DIAGNOSIS — R531 Weakness: Secondary | ICD-10-CM | POA: Diagnosis not present

## 2022-07-10 DIAGNOSIS — W01198A Fall on same level from slipping, tripping and stumbling with subsequent striking against other object, initial encounter: Secondary | ICD-10-CM | POA: Insufficient documentation

## 2022-07-10 DIAGNOSIS — M542 Cervicalgia: Secondary | ICD-10-CM | POA: Diagnosis not present

## 2022-07-10 DIAGNOSIS — G4489 Other headache syndrome: Secondary | ICD-10-CM | POA: Diagnosis not present

## 2022-07-10 DIAGNOSIS — S199XXA Unspecified injury of neck, initial encounter: Secondary | ICD-10-CM | POA: Diagnosis not present

## 2022-07-10 DIAGNOSIS — R42 Dizziness and giddiness: Secondary | ICD-10-CM | POA: Diagnosis not present

## 2022-07-10 DIAGNOSIS — S0990XA Unspecified injury of head, initial encounter: Secondary | ICD-10-CM | POA: Diagnosis not present

## 2022-07-10 LAB — CBC WITH DIFFERENTIAL/PLATELET
Abs Immature Granulocytes: 0.02 10*3/uL (ref 0.00–0.07)
Basophils Absolute: 0.1 10*3/uL (ref 0.0–0.1)
Basophils Relative: 1 %
Eosinophils Absolute: 0.4 10*3/uL (ref 0.0–0.5)
Eosinophils Relative: 6 %
HCT: 41.5 % (ref 36.0–46.0)
Hemoglobin: 13.5 g/dL (ref 12.0–15.0)
Immature Granulocytes: 0 %
Lymphocytes Relative: 32 %
Lymphs Abs: 2.1 10*3/uL (ref 0.7–4.0)
MCH: 27.9 pg (ref 26.0–34.0)
MCHC: 32.5 g/dL (ref 30.0–36.0)
MCV: 85.7 fL (ref 80.0–100.0)
Monocytes Absolute: 0.4 10*3/uL (ref 0.1–1.0)
Monocytes Relative: 5 %
Neutro Abs: 3.7 10*3/uL (ref 1.7–7.7)
Neutrophils Relative %: 56 %
Platelets: 309 10*3/uL (ref 150–400)
RBC: 4.84 MIL/uL (ref 3.87–5.11)
RDW: 12.6 % (ref 11.5–15.5)
WBC: 6.7 10*3/uL (ref 4.0–10.5)
nRBC: 0 % (ref 0.0–0.2)

## 2022-07-10 LAB — URINALYSIS, ROUTINE W REFLEX MICROSCOPIC
Bilirubin Urine: NEGATIVE
Glucose, UA: NEGATIVE mg/dL
Hgb urine dipstick: NEGATIVE
Ketones, ur: NEGATIVE mg/dL
Nitrite: NEGATIVE
Protein, ur: NEGATIVE mg/dL
Specific Gravity, Urine: 1.003 — ABNORMAL LOW (ref 1.005–1.030)
pH: 6 (ref 5.0–8.0)

## 2022-07-10 LAB — COMPREHENSIVE METABOLIC PANEL
ALT: 12 U/L (ref 0–44)
AST: 17 U/L (ref 15–41)
Albumin: 4.3 g/dL (ref 3.5–5.0)
Alkaline Phosphatase: 84 U/L (ref 38–126)
Anion gap: 8 (ref 5–15)
BUN: 19 mg/dL (ref 6–20)
CO2: 29 mmol/L (ref 22–32)
Calcium: 9.1 mg/dL (ref 8.9–10.3)
Chloride: 102 mmol/L (ref 98–111)
Creatinine, Ser: 0.93 mg/dL (ref 0.44–1.00)
GFR, Estimated: >60 mL/min (ref >60)
Glucose, Bld: 104 mg/dL — ABNORMAL HIGH (ref 70–99)
Potassium: 4.3 mmol/L (ref 3.5–5.1)
Sodium: 139 mmol/L (ref 135–145)
Total Bilirubin: 0.4 mg/dL (ref 0.3–1.2)
Total Protein: 7.5 g/dL (ref 6.5–8.1)

## 2022-07-10 LAB — TROPONIN I (HIGH SENSITIVITY): Troponin I (High Sensitivity): <2 ng/L (ref <18)

## 2022-07-10 MED ORDER — ONDANSETRON 4 MG PO TBDP
4.0000 mg | ORAL_TABLET | Freq: Once | ORAL | Status: AC
  Administered 2022-07-10: 4 mg via ORAL
  Filled 2022-07-10: qty 1

## 2022-07-10 NOTE — ED Notes (Signed)
This pt was found to be 88% on RA. This RN placed patient on [place] at 2 liters and improved to 99%.

## 2022-07-10 NOTE — ED Triage Notes (Signed)
Pt arrived via [facility] from home with increased sleeping and weakness today after having a folding closet door fall onto her yesterday 11/25, hitting her in head. Pt is wheelchair/bed bound due to previous head traumas. Per EMS, pt has been able to answer all questions appropriately and on arrival pt able to answer all questions writer asked and is A&O x4 with slow response to answers. No obvious injury seen to head on assessment. Pt denies LOC.

## 2022-07-11 ENCOUNTER — Other Ambulatory Visit: Payer: Self-pay | Admitting: Gastroenterology

## 2022-07-11 ENCOUNTER — Emergency Department
Admission: EM | Admit: 2022-07-11 | Discharge: 2022-07-11 | Disposition: A | Discharge status: Home / Self Care | Payer: Medicare Other | Attending: Emergency Medicine | Admitting: Emergency Medicine

## 2022-07-11 DIAGNOSIS — S060X0A Concussion without loss of consciousness, initial encounter: Secondary | ICD-10-CM

## 2022-07-11 DIAGNOSIS — R12 Heartburn: Secondary | ICD-10-CM

## 2022-07-11 DIAGNOSIS — R42 Dizziness and giddiness: Secondary | ICD-10-CM

## 2022-07-11 DIAGNOSIS — S0990XA Unspecified injury of head, initial encounter: Secondary | ICD-10-CM

## 2022-07-11 LAB — TROPONIN I (HIGH SENSITIVITY): Troponin I (High Sensitivity): <2 ng/L (ref <18)

## 2022-07-11 NOTE — ED Provider Notes (Signed)
[facility] Provider Note   Event Date/Time   First MD Initiated Contact with Patient 07/11/22 [phone]     (approximate) History  Dizziness  HPI Karen [name] is a 46 y.o. female with a stated past medical history of fibromyalgia, Ehlers-Danlos syndrome, chronic wheelchair use secondary to multiple head trauma, major depressive disorder, epilepsy, and rheumatoid arthritis, who presents for increased sleepiness and weakness today after a closet door fell hitting her in the head 2 days prior to arrival.  Patient states that she has been feeling increased fatigue and mild lightheadedness throughout the day.  Patient states that she has also been slightly slow to answer questions and has had a mild headache all day as well.  Patient denies any loss of consciousness after this head trauma or any subsequent loss of consciousness. ROS: Patient currently denies any vision changes, tinnitus, difficulty speaking, facial droop, sore throat, chest pain, shortness of breath, abdominal pain, nausea/vomiting/diarrhea, dysuria, or numbness/paresthesias in any extremity   Physical Exam  Triage Vital Signs: ED Triage Vitals  Enc Vitals Group     BP 07/10/22 2039 124/84     Pulse Rate 07/10/22 2039 70     Resp 07/10/22 2039 16     Temp 07/10/22 2039 98 F (36.7 C)     Temp Source 07/10/22 2039 Oral     SpO2 07/10/22 2039 (!) 88 %     Weight 07/10/22 2032 123 lb 7.3 oz (56 kg)     Height 07/10/22 2032 '4\' 11"'$  (1.499 m)     Head Circumference --      Peak Flow --      Pain Score 07/10/22 2039 10     Pain Loc --      Pain Edu? --      Excl. in [place]? --    Most recent vital signs: Vitals:   07/10/22 2336 07/11/22 0055  BP: 122/74 127/64  Pulse: (!) 20 75  Resp: 18 18  Temp: 97.7 F (36.5 C) 98 F (36.7 C)  SpO2:  96%   General: Awake, oriented x4. CV:  Good peripheral perfusion.  Resp:  Normal effort.  Abd:  No distention.  Other:  Middle-aged Hispanic female laying in  bed in no acute distress ED Results / Procedures / Treatments  Labs (all labs ordered are listed, but only abnormal results are displayed) Labs Reviewed  COMPREHENSIVE METABOLIC PANEL - Abnormal; Notable for the following components:      Result Value   Glucose, Bld 104 (*)    All other components within normal limits  URINALYSIS, ROUTINE W REFLEX MICROSCOPIC - Abnormal; Notable for the following components:   Color, Urine YELLOW (*)    APPearance CLEAR (*)    Specific Gravity, Urine 1.003 (*)    Leukocytes,Ua SMALL (*)    Bacteria, UA MANY (*)    All other components within normal limits  URINE CULTURE  CBC WITH DIFFERENTIAL/PLATELET  TROPONIN I (HIGH SENSITIVITY)  TROPONIN I (HIGH SENSITIVITY)   RADIOLOGY ED MD interpretation: 2 view chest x-ray interpreted by me shows no evidence of acute abnormalities including no pneumonia, pneumothorax, or widened mediastinum  CT of the head without contrast interpreted by me shows no evidence of acute abnormalities including no intracerebral hemorrhage, obvious masses, or significant edema  CT of the cervical spine interpreted by me does not show any evidence of acute abnormalities including no acute fracture, malalignment, height loss, or dislocation -Agree with radiology assessment Official radiology report(s):  DG Chest 2 View  Result Date: 07/10/2022 CLINICAL DATA:  Increasing weakness EXAM: CHEST - 2 VIEW COMPARISON:  10/04/2021 FINDINGS: The heart size and mediastinal contours are within normal limits. Both lungs are clear. The visualized skeletal structures are unremarkable. IMPRESSION: No active cardiopulmonary disease. Electronically Signed   By: Inez Catalina M.D.   On: 07/10/2022 21:11   CT HEAD WO CONTRAST (5MM)  Result Date: 07/10/2022 CLINICAL DATA:  Mental status change, unknown cause; Neck trauma, midline tenderness (Age 2-64y) EXAM: CT HEAD WITHOUT CONTRAST CT CERVICAL SPINE WITHOUT CONTRAST TECHNIQUE: Multidetector CT  imaging of the head and cervical spine was performed following the standard protocol without intravenous contrast. Multiplanar CT image reconstructions of the cervical spine were also generated. RADIATION DOSE REDUCTION: This exam was performed according to the departmental dose-optimization program which includes automated exposure control, adjustment of the mA and/or kV according to patient size and/or use of iterative reconstruction technique. COMPARISON:  CT angio chest 10/04/2021, CT head 10/04/2021, MRI cervical spine 09/18/2012 FINDINGS: CT HEAD FINDINGS Brain: No evidence of large-territorial acute infarction. No parenchymal hemorrhage. No mass lesion. No extra-axial collection. No mass effect or midline shift. No hydrocephalus. Basilar cisterns are patent. Vascular: No hyperdense vessel. Skull: No acute fracture or focal lesion. Surgical hardware along bilateral 9 below. Sinuses/Orbits: Paranasal sinuses and mastoid air cells are clear. The orbits are unremarkable. Other: None. CT CERVICAL SPINE FINDINGS Alignment: Straightening of the normal cervical lordosis likely due to positioning. Skull base and vertebrae: No acute fracture. No aggressive appearing focal osseous lesion or focal pathologic process. Soft tissues and spinal canal: No prevertebral fluid or swelling. No visible canal hematoma. Upper chest: Unremarkable. Other: Similar-appearing slightly heterogeneous nonenlarged thyroid gland with calcification. Not clinically significant; no follow-up imaging recommended (ref: J Am Coll Radiol. 2015 Feb;12(2): 143-50). IMPRESSION: 1. No acute intracranial abnormality. 2. No acute displaced fracture or traumatic listhesis of the cervical spine. Electronically Signed   By: Iven Finn M.D.   On: 07/10/2022 21:06   CT Cervical Spine Wo Contrast  Result Date: 07/10/2022 CLINICAL DATA:  Mental status change, unknown cause; Neck trauma, midline tenderness (Age 2-64y) EXAM: CT HEAD WITHOUT CONTRAST CT  CERVICAL SPINE WITHOUT CONTRAST TECHNIQUE: Multidetector CT imaging of the head and cervical spine was performed following the standard protocol without intravenous contrast. Multiplanar CT image reconstructions of the cervical spine were also generated. RADIATION DOSE REDUCTION: This exam was performed according to the departmental dose-optimization program which includes automated exposure control, adjustment of the mA and/or kV according to patient size and/or use of iterative reconstruction technique. COMPARISON:  CT angio chest 10/04/2021, CT head 10/04/2021, MRI cervical spine 09/18/2012 FINDINGS: CT HEAD FINDINGS Brain: No evidence of large-territorial acute infarction. No parenchymal hemorrhage. No mass lesion. No extra-axial collection. No mass effect or midline shift. No hydrocephalus. Basilar cisterns are patent. Vascular: No hyperdense vessel. Skull: No acute fracture or focal lesion. Surgical hardware along bilateral 9 below. Sinuses/Orbits: Paranasal sinuses and mastoid air cells are clear. The orbits are unremarkable. Other: None. CT CERVICAL SPINE FINDINGS Alignment: Straightening of the normal cervical lordosis likely due to positioning. Skull base and vertebrae: No acute fracture. No aggressive appearing focal osseous lesion or focal pathologic process. Soft tissues and spinal canal: No prevertebral fluid or swelling. No visible canal hematoma. Upper chest: Unremarkable. Other: Similar-appearing slightly heterogeneous nonenlarged thyroid gland with calcification. Not clinically significant; no follow-up imaging recommended (ref: J Am Coll Radiol. 2015 Feb;12(2): 143-50). IMPRESSION: 1. No acute intracranial abnormality. 2.  No acute displaced fracture or traumatic listhesis of the cervical spine. Electronically Signed   By: Iven Finn M.D.   On: 07/10/2022 21:06   PROCEDURES: Critical Care performed: No Procedures MEDICATIONS ORDERED IN ED: Medications  ondansetron (ZOFRAN-ODT)  disintegrating tablet 4 mg (4 mg Oral Given 07/10/22 2233)   IMPRESSION / MDM / ASSESSMENT AND PLAN / ED COURSE  I reviewed the triage vital signs and the nursing notes.                             The patient is on the cardiac monitor to evaluate for evidence of arrhythmia and/or significant heart rate changes. Patient's presentation is most consistent with acute presentation with potential threat to life or bodily function. Patient presenting with head trauma.  Patient's neurological exam was non-focal and unremarkable.  [place] Head CT Rule was applied and patient did not fall into the low risk category so a head CT was obtained.  This showed no significant findings.  At this time, it is felt that the most likely explanation for the patient's symptoms is concussion.   I also considered SAH, SDH, Epidural Hematoma, IPH, skull fracture, migraine but this appears less likely considering the data gathered thus far.   Patient provided concussion protocol.   Patient remained stable and neurologically intact while in the emergency department.  Discussed warning signs that would prompt return to ED.  Head trauma handout was provided.  Discussed in detail concussion management.  No sports or strenuous activity until symptoms free.  Return to emergency department urgently if new or worsening symptoms develop.    Impression:  Concussion Lightheadedness  Plan  Discharge from ED Tylenol for pain control. Avoid aspirin, NSAIDs, or other blood thinners. Advised patient on supportive measures for cognitive rest - avoid use of cognitive function for at least 24 hours.  This means no tv, books, texting, computers, etc. Limit visitors to the house.  Head trauma instructions provided in discharge instructions Instructed Pt to monitor for neurologic symptoms, severe HA, change in mental status, seizures, loss of conciousness. Instructed Pt to f/up w/ PCP in 7 days or ETC should symptoms worsen or not improve. Pt  verbally expressed understanding and all questions were addressed to Pt's satisfaction.   FINAL CLINICAL IMPRESSION(S) / ED DIAGNOSES   Final diagnoses:  Injury of head, initial encounter  Lightheadedness  Concussion without loss of consciousness, initial encounter   Rx / DC Orders   ED Discharge Orders     None      Note:  This document was prepared using Dragon voice recognition software and may include unintentional dictation errors.   Naaman Plummer, MD 07/11/22 [phone]

## 2022-07-11 NOTE — ED Notes (Signed)
Pt Dc to home. Dc instructions reviewed with all questions answered. Pt verbalizes understanding. Pt assisted out of dept via wheelchair by [place], EDT.

## 2022-07-13 ENCOUNTER — Telehealth: Payer: Self-pay | Admitting: *Deleted

## 2022-07-13 LAB — URINE CULTURE: Culture: >=100000 — AB

## 2022-07-13 NOTE — Patient Outreach (Signed)
  Care Coordination TOC Note Transition Care Management Unsuccessful Follow-up Telephone Call  Date of discharge and from where:  [facility] ER 07/11/2022  Attempts:  1st Attempt  Reason for unsuccessful TCM follow-up call:  Missing or invalid number  [place] Management [phone]

## 2022-07-13 NOTE — Progress Notes (Incomplete)
07/14/2022 12:36 PM   Karen [name] 1976/03/02 [id number]  Referring provider: Birdie Sons, [place],  [place] [address]  Urological history: 1. rUTI's -Contributing factors of age, incontinence, nonambulatory, incomplete bladder emptying and constipation -CT, 04/2021 - The left kidney demonstrates moderate renal cortical scarring changes.  Marked distention of the bladder is noted. It extends well above the umbilicus. Possible bladder outlet obstruction. No bladder wall thickening or bladder mass. No bladder calculi. -Documented urine cultures over the last year  E. Coli July 10, 2022  MUF May 10, 2022  E.coli 04/03/2022   E.coli 12/13/2021  MUF 11/10/2021  E. coli October 04, 2021   2. Incontinence -Contributing factors of age, Ehlers-Danlos syndrome, fibromyalgia, sleep apnea, nonambulatory, asthma, depression and seizures -PVR *** mL -Tamsulosin 0.4 mg daily  No chief complaint on file.   HPI: Karen [name] is a 46 y.o. female who presents today for a PVR with her mother, Karen [name].    PMH: Past Medical History:  Diagnosis Date   Ehlers-Danlos syndrome    Fibromyalgia    Headache    Polyp of descending colon    Sleep apnea    Tics of organic origin    Transient alteration of awareness     Surgical History: Past Surgical History:  Procedure Laterality Date   COLONOSCOPY WITH PROPOFOL N/A 02/13/2018   Procedure: COLONOSCOPY WITH PROPOFOL;  Surgeon: [name], MD;  Location: [facility] ENDOSCOPY;  Service: Endoscopy;  Laterality: N/A;   COLONOSCOPY WITH PROPOFOL N/A 01/11/2022   Procedure: COLONOSCOPY WITH PROPOFOL;  Surgeon: [name], MD;  Location: [facility] ENDOSCOPY;  Service: Endoscopy;  Laterality: N/A;   DILATION AND CURETTAGE OF UTERUS  08/16/2007   ESOPHAGOGASTRODUODENOSCOPY (EGD) WITH PROPOFOL  02/13/2018   Procedure: ESOPHAGOGASTRODUODENOSCOPY (EGD) WITH PROPOFOL;  Surgeon: [name], MD;  Location:  [place] ENDOSCOPY;  Service: Endoscopy;;   EYE SURGERY Left 08/15/1988   3 Surgeries on left eye to correct crossed eye   LAPAROSCOPY Left 08/15/2001   Fallopian Tube   MANDIBLE SURGERY  08/15/1996   OTHER SURGICAL HISTORY Right 08/15/1985   3 Surgeries to repair broken arm   OTHER SURGICAL HISTORY Left    3 surgeries on her left thigh as an infant   OTHER SURGICAL HISTORY  08/15/1996   Jaw surgery   Right arm surgery  08/15/1985   x 3 due to fracture   TOENAIL EXCISION Bilateral 06/13/2019   Procedure: BILATERAL SECOND TOE PARTIAL NAIL ABLATION;  Surgeon: Erle Crocker, MD;  Location: [place];  Service: Orthopedics;  Laterality: Bilateral;  SURGERY REQUEST TIME 1 HOUR   TONSILLECTOMY  12/13/2002   TONSILLECTOMY      Home Medications:  Allergies as of 07/14/2022       Reactions   Augmentin  [amoxicillin-pot Clavulanate] Diarrhea   Chocolate Flavor    Other reaction(s): Other (See Comments) Wheezing, acne   Demerol [meperidine] Other (See Comments)   Slow to wake up when this drug is given.    Keflex [cephalexin] Nausea And Vomiting   Morphine And Related Nausea And Vomiting   Tape Dermatitis   Must use paper tape only   Toradol [ketorolac Tromethamine] Nausea And Vomiting   Amoxicillin    Other reaction(s): Unknown   Chocolate    GI distress   Nsaids    patient develops ulcers Other reaction(s): Other (See Comments) patient develops ulcers   Strawberry Extract    GI distress  Medication List        Accurate as of July 13, 2022 12:36 PM. If you have any questions, ask your nurse or doctor.          albuterol 108 (90 Base) MCG/ACT inhaler Commonly known as: Ventolin HFA Inhale 2 puffs into the lungs every 6 (six) hours as needed.   busPIRone 15 MG tablet Commonly known as: BUSPAR TAKE 1 TABLET BY MOUTH TWICE A DAY   busPIRone 10 MG tablet Commonly known as: BUSPAR TAKE 1 TABLET (10 MG TOTAL) BY MOUTH 2 (TWO) TIMES  DAILY. TAKE ALONG WITH 15MG TABLET TWICE A DAY   celecoxib 200 MG capsule Commonly known as: CELEBREX TAKE 1 CAPSULE BY MOUTH TWICE A DAY   Ciclopirox 8 % Kit Apply 1 application  topically daily.   diclofenac Sodium 1 % Gel Commonly known as: VOLTAREN APPLY AS NEEDED 3 TIMES A DAY   DULoxetine 60 MG capsule Commonly known as: CYMBALTA Take 2 capsules (120 mg total) by mouth daily.   fentaNYL 75 MCG/HR Commonly known as: [place] 1 patch onto the skin every 3 (three) days.   fexofenadine 180 MG tablet Commonly known as: ALLEGRA Take 180 mg by mouth daily.   fluticasone 50 MCG/ACT nasal spray Commonly known as: FLONASE   Fluticasone-Salmeterol 250-50 MCG/DOSE Aepb Commonly known as: ADVAIR Inhale 1 puff into the lungs every 12 (twelve) hours. Rinse mouth after use   gabapentin 800 MG tablet Commonly known as: NEURONTIN TAKE 1 TABLET BY MOUTH 3 TIMES DAILY.   haloperidol 0.5 MG tablet Commonly known as: HALDOL TAKE 1/2 OF A TABLET (0.25 MG TOTAL) BY MOUTH TWICE A DAY   lurasidone 80 MG Tabs tablet Commonly known as: Latuda Take 1 tablet (80 mg total) by mouth daily with supper.   Na Sulfate-K Sulfate-Mg Sulf 17.5-3.13-1.6 GM/177ML Soln See admin instructions.   naloxone 4 MG/0.1ML Liqd nasal spray kit Commonly known as: NARCAN Place 1 spray into the nose as needed.   omeprazole 40 MG capsule Commonly known as: PRILOSEC TAKE 1 CAPSULE (40 MG TOTAL) BY MOUTH 2 (TWO) TIMES DAILY. SCHEDULE OFFICE VISIT   ondansetron 4 MG disintegrating tablet Commonly known as: ZOFRAN-ODT DISSOLVE 1 TABLET IN MOUTH EVERY 8 HOURS FOR NAUSEA AND VOMITING   QUEtiapine 25 MG tablet Commonly known as: SEROQUEL Take 1 tablet (25 mg total) by mouth at bedtime.   tiZANidine 4 MG tablet Commonly known as: ZANAFLEX TAKE 0.5-1 TABLETS (2-4 MG TOTAL) BY MOUTH EVERY 8 (EIGHT) HOURS AS NEEDED FOR MUSCLE SPASMS.   trimethoprim-polymyxin b ophthalmic solution Commonly known as:  POLYTRIM Place 1 drop into both eyes every 6 (six) hours.        Allergies:  Allergies  Allergen Reactions   Augmentin  [Amoxicillin-Pot Clavulanate] Diarrhea   Chocolate Flavor     Other reaction(s): Other (See Comments) Wheezing, acne   Demerol [Meperidine] Other (See Comments)    Slow to wake up when this drug is given.    Keflex [Cephalexin] Nausea And Vomiting   Morphine And Related Nausea And Vomiting   Tape Dermatitis    Must use paper tape only   Toradol [Ketorolac Tromethamine] Nausea And Vomiting   Amoxicillin     Other reaction(s): Unknown   Chocolate     GI distress   Nsaids     patient develops ulcers Other reaction(s): Other (See Comments) patient develops ulcers   Strawberry Extract     GI distress    Family History: Family  History  Problem Relation Age of Onset   Depression Mother    Stroke Mother    Fibromyalgia Mother    Osteoarthritis Mother    Asthma Brother    Depression Brother    Migraines Brother    Asperger's syndrome Brother    Other Brother        Ehlers-Danlos Syndrome   Cancer Maternal Aunt    Asperger's syndrome Maternal Aunt    Breast cancer Maternal Aunt    Heart disease Paternal Aunt    Heart disease Paternal Uncle    Cervical cancer Maternal Grandmother    Osteoporosis Maternal Grandmother        Died at 46   Osteoarthritis Maternal Grandmother    Colon cancer Maternal Grandfather    Asthma Maternal Grandfather    Asperger's syndrome Maternal Grandfather    Emphysema Maternal Grandfather        Died at 20   Prostate cancer Maternal Grandfather    Heart attack Paternal Grandfather        Died at 37   Asthma Paternal Grandfather    Tuberculosis Paternal Grandfather    Cancer Cousin    Asperger's syndrome Cousin    Asperger's syndrome Other    Heart Problems Paternal Grandmother        Died at 10   Ehlers-Danlos syndrome Father    Ehlers-Danlos syndrome Brother     Social History:  reports that she has never  smoked. She has never used smokeless tobacco. She reports that she does not drink alcohol and does not use drugs.  ROS: Pertinent ROS in HPI  Physical Exam: There were no vitals taken for this visit.  Constitutional:  Well nourished. Alert and oriented, No acute distress. HEENT: [place] AT, moist mucus membranes.  Trachea midline  Cardiovascular: No clubbing, cyanosis, or edema. Respiratory: Normal respiratory effort, no increased work of breathing. Neurologic: Grossly intact, no focal deficits, in wheelchair  Psychiatric: Normal mood and affect.    Laboratory Data: Lab Results  Component Value Date   WBC 6.7 07/10/2022   HGB 13.5 07/10/2022   HCT 41.5 07/10/2022   MCV 85.7 07/10/2022   PLT 309 07/10/2022    Lab Results  Component Value Date   CREATININE 0.93 07/10/2022   Lab Results  Component Value Date   TSH 1.057 10/05/2021      Component Value Date/Time   CHOL 189 10/15/2019 1023   HDL 70 10/15/2019 1023   LDLCALC 108 (H) 10/15/2019 1023    Lab Results  Component Value Date   AST 17 07/10/2022   Lab Results  Component Value Date   ALT 12 07/10/2022    Urinalysis Results for orders placed or performed during the hospital encounter of 07/11/22  Urine Culture   Specimen: Urine, Clean Catch  Result Value Ref Range   Specimen Description      URINE, CLEAN CATCH Performed at [facility], [address], [place], [place] [address]    Special Requests      NONE Performed at [facility], [address] [place], [place] [address]    Culture >=100,000 COLONIES/mL ESCHERICHIA COLI (A)    Report Status 07/13/2022 FINAL    Organism ID, Bacteria ESCHERICHIA COLI (A)       Susceptibility   Escherichia coli - MIC*    AMPICILLIN 8 SENSITIVE Sensitive     CEFAZOLIN <=4 SENSITIVE Sensitive     CEFEPIME <=0.12 SENSITIVE Sensitive     CEFTRIAXONE <=0.25 SENSITIVE Sensitive  CIPROFLOXACIN <=0.25 SENSITIVE Sensitive     GENTAMICIN <=1 SENSITIVE  Sensitive     IMIPENEM <=0.25 SENSITIVE Sensitive     NITROFURANTOIN <=16 SENSITIVE Sensitive     TRIMETH/SULFA <=20 SENSITIVE Sensitive     AMPICILLIN/SULBACTAM 4 SENSITIVE Sensitive     PIP/TAZO <=4 SENSITIVE Sensitive     * >=100,000 COLONIES/mL ESCHERICHIA COLI  Comprehensive metabolic panel  Result Value Ref Range   Sodium 139 135 - 145 mmol/L   Potassium 4.3 3.5 - 5.1 mmol/L   Chloride 102 98 - 111 mmol/L   CO2 29 22 - 32 mmol/L   Glucose, Bld 104 (H) 70 - 99 mg/dL   BUN 19 6 - 20 mg/dL   Creatinine, Ser 0.93 0.44 - 1.00 mg/dL   Calcium 9.1 8.9 - 10.3 mg/dL   Total Protein 7.5 6.5 - 8.1 g/dL   Albumin 4.3 3.5 - 5.0 g/dL   AST 17 15 - 41 U/L   ALT 12 0 - 44 U/L   Alkaline Phosphatase 84 38 - 126 U/L   Total Bilirubin 0.4 0.3 - 1.2 mg/dL   GFR, Estimated >60 >60 mL/min   Anion gap 8 5 - 15  CBC with Differential  Result Value Ref Range   WBC 6.7 4.0 - 10.5 K/uL   RBC 4.84 3.87 - 5.11 MIL/uL   Hemoglobin 13.5 12.0 - 15.0 g/dL   HCT 41.5 36.0 - 46.0 %   MCV 85.7 80.0 - 100.0 fL   MCH 27.9 26.0 - 34.0 pg   MCHC 32.5 30.0 - 36.0 g/dL   RDW 12.6 11.5 - 15.5 %   Platelets 309 150 - 400 K/uL   nRBC 0.0 0.0 - 0.2 %   Neutrophils Relative % 56 %   Neutro Abs 3.7 1.7 - 7.7 K/uL   Lymphocytes Relative 32 %   Lymphs Abs 2.1 0.7 - 4.0 K/uL   Monocytes Relative 5 %   Monocytes Absolute 0.4 0.1 - 1.0 K/uL   Eosinophils Relative 6 %   Eosinophils Absolute 0.4 0.0 - 0.5 K/uL   Basophils Relative 1 %   Basophils Absolute 0.1 0.0 - 0.1 K/uL   Immature Granulocytes 0 %   Abs Immature Granulocytes 0.02 0.00 - 0.07 K/uL  Urinalysis, Routine w reflex microscopic Urine, Clean Catch  Result Value Ref Range   Color, Urine YELLOW (A) YELLOW   APPearance CLEAR (A) CLEAR   Specific Gravity, Urine 1.003 (L) 1.005 - 1.030   pH 6.0 5.0 - 8.0   Glucose, UA NEGATIVE NEGATIVE mg/dL   Hgb urine dipstick NEGATIVE NEGATIVE   Bilirubin Urine NEGATIVE NEGATIVE   Ketones, ur NEGATIVE NEGATIVE  mg/dL   Protein, ur NEGATIVE NEGATIVE mg/dL   Nitrite NEGATIVE NEGATIVE   Leukocytes,Ua SMALL (A) NEGATIVE   RBC / HPF 0-5 0 - 5 RBC/hpf   WBC, UA 21-50 0 - 5 WBC/hpf   Bacteria, UA MANY (A) NONE SEEN   Squamous Epithelial / LPF 0-5 0 - 5  Troponin I (High Sensitivity)  Result Value Ref Range   Troponin I (High Sensitivity) <2 <18 ng/L  Troponin I (High Sensitivity)  Result Value Ref Range   Troponin I (High Sensitivity) <2 <18 ng/L    I have reviewed the labs.   Assessment & Plan:    1. rUTI's -Current infection resolved -Continue preventative strategies  2. Incontinence -Back at baseline symptoms -Does not want to pursue further treatment of incontinence at this time due to fear of a return of  her psychosis  No follow-ups on file.  These notes generated with voice recognition software. I apologize for typographical errors.  Zara Council, PA-C  [facility] [address]  [place], [place] [address] [phone]

## 2022-07-14 ENCOUNTER — Ambulatory Visit: Payer: Medicare Other | Admitting: Urology

## 2022-07-14 ENCOUNTER — Telehealth: Payer: Self-pay

## 2022-07-14 NOTE — Consult Note (Signed)
ED Antimicrobial Stewardship Positive Culture Follow Up   Karen [name] is an 46 y.o. female who presented to [facility] on 07/11/2022 with a chief complaint of  Chief Complaint  Patient presents with   Dizziness    Recent Results (from the past 720 hour(s))  Urine Culture     Status: Abnormal   Collection Time: 07/10/22  9:52 PM   Specimen: Urine, Clean Catch  Result Value Ref Range Status   Specimen Description   Final    URINE, CLEAN CATCH Performed at [facility], [address] [place], [place] [address]    Special Requests   Final    NONE Performed at [facility], [address], [place], [place] [address]    Culture >=100,000 COLONIES/mL ESCHERICHIA COLI (A)  Final   Report Status 07/13/2022 FINAL  Final   Organism ID, Bacteria ESCHERICHIA COLI (A)  Final      Susceptibility   Escherichia coli - MIC*    AMPICILLIN 8 SENSITIVE Sensitive     CEFAZOLIN <=4 SENSITIVE Sensitive     CEFEPIME <=0.12 SENSITIVE Sensitive     CEFTRIAXONE <=0.25 SENSITIVE Sensitive     CIPROFLOXACIN <=0.25 SENSITIVE Sensitive     GENTAMICIN <=1 SENSITIVE Sensitive     IMIPENEM <=0.25 SENSITIVE Sensitive     NITROFURANTOIN <=16 SENSITIVE Sensitive     TRIMETH/SULFA <=20 SENSITIVE Sensitive     AMPICILLIN/SULBACTAM 4 SENSITIVE Sensitive     PIP/TAZO <=4 SENSITIVE Sensitive     * >=100,000 COLONIES/mL ESCHERICHIA COLI   '[x]'$  Patient discharged originally without antimicrobial agent and treatment is now indicated  New antibiotic prescription: Bactrim DS 800/'160mg'$  1 tab twice daily x 3 days  ED Provider: [facility]  Will M. Ouida Sills, PharmD PGY-1 Pharmacy Resident 07/14/2022 4:47 PM

## 2022-07-14 NOTE — Telephone Encounter (Signed)
Copied from [place] #[id number]. Topic: General - Inquiry >> Jul 14, 2022  2:45 PM Chapman Fitch wrote: Reason for CRM: [employer] faxed over request for Cpap Supplies / please confirm if received on 11.13.23/ please advise/ fax# [phone]

## 2022-07-15 ENCOUNTER — Telehealth: Payer: Self-pay

## 2022-07-15 NOTE — Patient Outreach (Signed)
  Care Coordination TOC Note Transition Care Management Unsuccessful Follow-up Telephone Call  Date of discharge and from where:  07/11/22-[facility] ED  Attempts:  2nd Attempt  Reason for unsuccessful TCM follow-up call:  Missing or invalid number    Enzo Montgomery, RN,BSN,CCM [place] [facility] Telephonic Care Management Coordinator Direct Phone: [phone] Toll Free: [phone] Fax: [phone]

## 2022-07-15 NOTE — Telephone Encounter (Signed)
Did you receive forms?

## 2022-07-18 ENCOUNTER — Telehealth: Payer: Self-pay | Admitting: *Deleted

## 2022-07-18 NOTE — Patient Outreach (Signed)
  Care Coordination [facility] Note Transition Care Management Unsuccessful Follow-up Telephone Call  Date of discharge and from where:  ER [id number]  Attempts:  3rd Attempt  Reason for unsuccessful TCM follow-up call:  Missing or invalid number  [facility] [phone]

## 2022-07-19 ENCOUNTER — Other Ambulatory Visit: Payer: Self-pay | Admitting: Psychiatry

## 2022-07-19 ENCOUNTER — Other Ambulatory Visit: Payer: Self-pay | Admitting: Family Medicine

## 2022-07-19 DIAGNOSIS — R200 Anesthesia of skin: Secondary | ICD-10-CM

## 2022-07-19 NOTE — Telephone Encounter (Signed)
Her sleep Apnea is managed by her pulmonologist, Keturah Barre at [facility]. They need to send order to him.

## 2022-07-19 NOTE — Telephone Encounter (Signed)
Called pharmacy and no refills remain. Requested Prescriptions  Pending Prescriptions Disp Refills   diclofenac Sodium (VOLTAREN) 1 % GEL [Pharmacy Med Name: DICLOFENAC SODIUM 1% GEL] 100 g 3    Sig: APPLY AS NEEDED 3 TIMES A DAY     Analgesics:  Topicals Failed - 07/19/2022  1:12 AM      Failed - Manual Review: Labs are only required if the patient has taken medication for more than 8 weeks.      Passed - PLT in normal range and within 360 days    Platelets  Date Value Ref Range Status  07/10/2022 309 150 - 400 K/uL Final  10/15/2019 256 150 - 450 x10E3/uL Final         Passed - HGB in normal range and within 360 days    Hemoglobin  Date Value Ref Range Status  07/10/2022 13.5 12.0 - 15.0 g/dL Final  10/15/2019 12.8 11.1 - 15.9 g/dL Final         Passed - HCT in normal range and within 360 days    HCT  Date Value Ref Range Status  07/10/2022 41.5 36.0 - 46.0 % Final   Hematocrit  Date Value Ref Range Status  10/15/2019 37.9 34.0 - 46.6 % Final         Passed - Cr in normal range and within 360 days    Creatinine  Date Value Ref Range Status  02/19/2014 0.82 0.60 - 1.30 mg/dL Final   Creatinine, Ser  Date Value Ref Range Status  07/10/2022 0.93 0.44 - 1.00 mg/dL Final         Passed - eGFR is 30 or above and within 360 days    EGFR (African American)  Date Value Ref Range Status  02/19/2014 >60  Final   GFR calc Af Amer  Date Value Ref Range Status  10/15/2019 80 >59 mL/min/1.73 Final   EGFR (Non-African Amer.)  Date Value Ref Range Status  02/19/2014 >60  Final    Comment:    eGFR values <41m/min/1.73 m2 may be an indication of chronic kidney disease (CKD). Calculated eGFR is useful in patients with stable renal function. The eGFR calculation will not be reliable in acutely ill patients when serum creatinine is changing rapidly. It is not useful in  patients on dialysis. The eGFR calculation may not be applicable to patients at the low and high extremes  of body sizes, pregnant women, and vegetarians.    GFR, Estimated  Date Value Ref Range Status  07/10/2022 >60 >60 mL/min Final    Comment:    (NOTE) Calculated using the CKD-EPI Creatinine Equation (2021)          Passed - Patient is not pregnant      Passed - Valid encounter within last 12 months    Recent Outpatient Visits           2 weeks ago Ehlers-Danlos [facility]   2 months ago Tinea pedis of right [facility] [place] [facility]   2 months ago Need for influenza vaccination   [facility]   4 months ago Pain in toes of both feet   [facility]   6 months ago Chronic bilateral back pain, unspecified back location   [facility]       Future Appointments  In 1 month Fisher, Kirstie Peri, MD [facility], [place]   In 4 months Ralene Bathe, MD [name]   In 11 months Ralene Bathe, MD [place]

## 2022-07-20 ENCOUNTER — Ambulatory Visit
Admission: RE | Admit: 2022-07-20 | Discharge: 2022-07-20 | Disposition: A | Discharge status: Home / Self Care | Payer: Medicare Other | Source: Ambulatory Visit | Attending: Psychiatry | Admitting: Psychiatry

## 2022-07-20 ENCOUNTER — Encounter: Payer: Self-pay | Admitting: Registered Nurse

## 2022-07-20 DIAGNOSIS — R200 Anesthesia of skin: Secondary | ICD-10-CM

## 2022-07-20 DIAGNOSIS — F331 Major depressive disorder, recurrent, moderate: Secondary | ICD-10-CM | POA: Diagnosis not present

## 2022-07-20 DIAGNOSIS — J45909 Unspecified asthma, uncomplicated: Secondary | ICD-10-CM | POA: Diagnosis not present

## 2022-07-20 DIAGNOSIS — F333 Major depressive disorder, recurrent, severe with psychotic symptoms: Secondary | ICD-10-CM | POA: Insufficient documentation

## 2022-07-20 DIAGNOSIS — G4730 Sleep apnea, unspecified: Secondary | ICD-10-CM | POA: Diagnosis not present

## 2022-07-20 MED ORDER — SUCCINYLCHOLINE CHLORIDE 200 MG/10ML IV SOSY
PREFILLED_SYRINGE | INTRAVENOUS | Status: DC | PRN
  Administered 2022-07-20: 60 mg via INTRAVENOUS

## 2022-07-20 MED ORDER — SODIUM CHLORIDE 0.9 % IV SOLN: INTRAVENOUS | Status: DC | PRN

## 2022-07-20 MED ORDER — SODIUM CHLORIDE 0.9 % IV SOLN
500.0000 mL | Freq: Once | INTRAVENOUS | Status: AC
  Administered 2022-07-20: 500 mL via INTRAVENOUS

## 2022-07-20 MED ORDER — MIDAZOLAM HCL 2 MG/2ML IJ SOLN
INTRAMUSCULAR | Status: DC | PRN
  Administered 2022-07-20: 2 mg via INTRAVENOUS

## 2022-07-20 MED ORDER — MIDAZOLAM HCL 2 MG/2ML IJ SOLN: 2.0000 mg | Freq: Once | INTRAMUSCULAR | Status: DC

## 2022-07-20 MED ORDER — PROPOFOL 10 MG/ML IV BOLUS
INTRAVENOUS | Status: DC | PRN
  Administered 2022-07-20: 40 mg via INTRAVENOUS

## 2022-07-20 MED ORDER — LABETALOL HCL 5 MG/ML IV SOLN
INTRAVENOUS | Status: DC | PRN
  Administered 2022-07-20: 5 mg via INTRAVENOUS

## 2022-07-20 MED ORDER — MIDAZOLAM HCL 2 MG/2ML IJ SOLN
INTRAMUSCULAR | Status: AC
  Filled 2022-07-20: qty 2

## 2022-07-20 MED ORDER — KETAMINE HCL 50 MG/5ML IJ SOSY
PREFILLED_SYRINGE | INTRAMUSCULAR | Status: AC
  Filled 2022-07-20: qty 5

## 2022-07-20 MED ORDER — KETAMINE HCL 10 MG/ML IJ SOLN
INTRAMUSCULAR | Status: DC | PRN
  Administered 2022-07-20: 30 mg via INTRAVENOUS

## 2022-07-20 NOTE — Transfer of Care (Signed)
Immediate Anesthesia Transfer of Care Note  Patient: Karen [name]  Procedure(s) Performed: ECT TX  Patient Location: PACU  Anesthesia Type:General  Level of Consciousness: awake, alert , and oriented  Airway & Oxygen Therapy: Patient Spontanous Breathing  Post-op Assessment: Report given to RN and Post -op Vital signs reviewed and stable  Post vital signs: Reviewed and stable  Last Vitals:  Vitals Value Taken Time  BP 139/76 07/20/22 1340  Temp 36.3 C 07/20/22 1340  Pulse 73 07/20/22 1343  Resp 13 07/20/22 1343  SpO2 98 % 07/20/22 1343  Vitals shown include unvalidated device data.  Last Pain:  Vitals:   07/20/22 1340  TempSrc:   PainSc: Asleep         Complications: No notable events documented.

## 2022-07-20 NOTE — Anesthesia Postprocedure Evaluation (Signed)
Anesthesia Post Note  Patient: Karen [name]  Procedure(s) Performed: ECT TX  Patient location during evaluation: PACU Anesthesia Type: General Level of consciousness: awake and alert Pain management: pain level controlled Vital Signs Assessment: post-procedure vital signs reviewed and stable Respiratory status: spontaneous breathing, nonlabored ventilation, respiratory function stable and patient connected to nasal cannula oxygen Cardiovascular status: blood pressure returned to baseline and stable Postop Assessment: no apparent nausea or vomiting Anesthetic complications: no   No notable events documented.   Last Vitals:  Vitals:   07/20/22 1340 07/20/22 1345  BP: 139/76 128/73  Pulse: 70 75  Resp: 13 13  Temp: (!) 36.3 C   SpO2: 98% 98%    Last Pain:  Vitals:   07/20/22 1340  TempSrc:   PainSc: Asleep                 Ilene Qua

## 2022-07-20 NOTE — H&P (Signed)
Karen [name] is an 46 y.o. female.   Chief Complaint: Denies any active depression.  Denies any hallucinations denies suicidal ideation HPI: History of recurrent episodes of depression with psychosis  Past Medical History:  Diagnosis Date   Ehlers-Danlos syndrome    Fibromyalgia    Headache    Polyp of descending colon    Sleep apnea    Tics of organic origin    Transient alteration of awareness     Past Surgical History:  Procedure Laterality Date   COLONOSCOPY WITH PROPOFOL N/A 02/13/2018   Procedure: COLONOSCOPY WITH PROPOFOL;  Surgeon: [name], MD;  Location: [facility] ENDOSCOPY;  Service: Endoscopy;  Laterality: N/A;   COLONOSCOPY WITH PROPOFOL N/A 01/11/2022   Procedure: COLONOSCOPY WITH PROPOFOL;  Surgeon: [name], MD;  Location: [facility]OSCOPY;  Service: Endoscopy;  Laterality: N/A;   DILATION AND CURETTAGE OF UTERUS  08/16/2007   ESOPHAGOGASTRODUODENOSCOPY (EGD) WITH PROPOFOL  02/13/2018   Procedure: ESOPHAGOGASTRODUODENOSCOPY (EGD) WITH PROPOFOL;  Surgeon: [name], MD;  Location: [facility];  Service: Endoscopy;;   EYE SURGERY Left 08/15/1988   3 Surgeries on left eye to correct crossed eye   LAPAROSCOPY Left 08/15/2001   Fallopian Tube   MANDIBLE SURGERY  08/15/1996   OTHER SURGICAL HISTORY Right 08/15/1985   3 Surgeries to repair broken arm   OTHER SURGICAL HISTORY Left    3 surgeries on her left thigh as an infant   OTHER SURGICAL HISTORY  08/15/1996   Jaw surgery   Right arm surgery  08/15/1985   x 3 due to fracture   TOENAIL EXCISION Bilateral 06/13/2019   Procedure: BILATERAL SECOND TOE PARTIAL NAIL ABLATION;  Surgeon: Erle Crocker, MD;  Location: [place];  Service: Orthopedics;  Laterality: Bilateral;  SURGERY REQUEST TIME 1 HOUR   TONSILLECTOMY  12/13/2002   TONSILLECTOMY      Family History  Problem Relation Age of Onset   Depression Mother    Stroke Mother    Fibromyalgia Mother    Osteoarthritis Mother     Asthma Brother    Depression Brother    Migraines Brother    Asperger's syndrome Brother    Other Brother        Ehlers-Danlos Syndrome   Cancer Maternal Aunt    Asperger's syndrome Maternal Aunt    Breast cancer Maternal Aunt    Heart disease Paternal Aunt    Heart disease Paternal Uncle    Cervical cancer Maternal Grandmother    Osteoporosis Maternal Grandmother        Died at 25   Osteoarthritis Maternal Grandmother    Colon cancer Maternal Grandfather    Asthma Maternal Grandfather    Asperger's syndrome Maternal Grandfather    Emphysema Maternal Grandfather        Died at 61   Prostate cancer Maternal Grandfather    Heart attack Paternal Grandfather        Died at 10   Asthma Paternal Grandfather    Tuberculosis Paternal Grandfather    Cancer Cousin    Asperger's syndrome Cousin    Asperger's syndrome Other    Heart Problems Paternal Grandmother        Died at 87   Ehlers-Danlos syndrome Father    Ehlers-Danlos syndrome Brother    Social History:  reports that she has never smoked. She has never used smokeless tobacco. She reports that she does not drink alcohol and does not use drugs.  Allergies:  Allergies  Allergen Reactions  Augmentin  [Amoxicillin-Pot Clavulanate] Diarrhea   Chocolate Flavor     Other reaction(s): Other (See Comments) Wheezing, acne   Demerol [Meperidine] Other (See Comments)    Slow to wake up when this drug is given.    Keflex [Cephalexin] Nausea And Vomiting   Morphine And Related Nausea And Vomiting   Tape Dermatitis    Must use paper tape only   Toradol [Ketorolac Tromethamine] Nausea And Vomiting   Amoxicillin     Other reaction(s): Unknown   Chocolate     GI distress   Nsaids     patient develops ulcers Other reaction(s): Other (See Comments) patient develops ulcers   Strawberry Extract     GI distress    (Not in a hospital admission)   No results found for this or any previous visit (from the past 48 hour(s)). No  results found.  Review of Systems  Constitutional: Negative.   HENT: Negative.    Eyes: Negative.   Respiratory: Negative.    Cardiovascular: Negative.   Gastrointestinal: Negative.   Musculoskeletal: Negative.   Skin: Negative.   Neurological: Negative.   Psychiatric/Behavioral: Negative.      Blood pressure 130/85, pulse 85, temperature 98.3 F (36.8 C), temperature source Oral, resp. rate 18, height '4\' 11"'$  (1.499 m), weight 56 kg, SpO2 95 %. Physical Exam Vitals reviewed.  Constitutional:      Appearance: She is well-developed.  HENT:     Head: Normocephalic and atraumatic.  Eyes:     Conjunctiva/sclera: Conjunctivae normal.     Pupils: Pupils are equal, round, and reactive to light.  Cardiovascular:     Heart sounds: Normal heart sounds.  Pulmonary:     Effort: Pulmonary effort is normal.  Abdominal:     Palpations: Abdomen is soft.  Musculoskeletal:        General: Normal range of motion.     Cervical back: Normal range of motion.  Skin:    General: Skin is warm and dry.  Neurological:     General: No focal deficit present.     Mental Status: She is alert.  Psychiatric:        Attention and Perception: Attention normal.        Mood and Affect: Mood normal.        Speech: Speech normal.        Behavior: Behavior normal.        Thought Content: Thought content normal.      Assessment/Plan Treatment today and recommend return in about 4 weeks continuing current [place]uda which appears to be well-tolerated  Alethia Berthold, MD 07/20/2022, 3:40 PM
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
## 2022-07-20 NOTE — Anesthesia Preprocedure Evaluation (Signed)
Anesthesia Evaluation  Patient identified by MRN, date of birth, ID band Patient awake    Reviewed: Allergy & Precautions, H&P , NPO status , Patient's Chart, lab work & pertinent test results, reviewed documented beta blocker date and time   Airway Mallampati: II   Neck ROM: full    Dental  (+) Poor Dentition   Pulmonary asthma , sleep apnea    Pulmonary exam normal        Cardiovascular negative cardio ROS Normal cardiovascular exam Rhythm:regular Rate:Normal  IMPRESSIONS     1. Left ventricular ejection fraction, by estimation, is 60 to 65%. The  left ventricle has normal function. The left ventricle has no regional  wall motion abnormalities. Left ventricular diastolic parameters are  consistent with Grade I diastolic  dysfunction (impaired relaxation).   2. Right ventricular systolic function is normal. The right ventricular  size is normal.   3. The mitral valve is grossly normal. Trivial mitral valve  regurgitation. No evidence of mitral stenosis.   4. The aortic valve has an indeterminant number of cusps. Aortic valve  regurgitation is not visualized. No aortic stenosis is present.   5. The inferior vena cava is normal in size with <50% respiratory  variability, suggesting right atrial pressure of 8 mmHg.     Neuro/Psych  Headaches, Seizures -,  PSYCHIATRIC DISORDERS Anxiety Depression     Neuromuscular disease    GI/Hepatic negative GI ROS, Neg liver ROS,,,  Endo/Other  negative endocrine ROS    Renal/GU negative Renal ROS  negative genitourinary   Musculoskeletal  (+) Arthritis ,  Fibromyalgia -Ehlers danlos    Abdominal   Peds  Hematology negative hematology ROS (+)   Anesthesia Other Findings Past Medical History: No date: Ehlers-Danlos syndrome No date: Fibromyalgia No date: Headache No date: Polyp of descending colon No date: Sleep apnea No date: Tics of organic origin No date: Transient  alteration of awareness Past Surgical History: 02/13/2018: COLONOSCOPY WITH PROPOFOL; N/A     Comment:  Procedure: COLONOSCOPY WITH PROPOFOL;  Surgeon: [name], MD;  Location: [facility] ENDOSCOPY;  Service:               Endoscopy;  Laterality: N/A; 01/11/2022: COLONOSCOPY WITH PROPOFOL; N/A     Comment:  Procedure: COLONOSCOPY WITH PROPOFOL;  Surgeon: [name], MD;  Location: [facility] ENDOSCOPY;  Service:               Endoscopy;  Laterality: N/A; 08/16/2007: DILATION AND CURETTAGE OF UTERUS 02/13/2018: ESOPHAGOGASTRODUODENOSCOPY (EGD) WITH PROPOFOL     Comment:  Procedure: ESOPHAGOGASTRODUODENOSCOPY (EGD) WITH               PROPOFOL;  Surgeon: [name], MD;  Location: [facility]               ENDOSCOPY;  Service: Endoscopy;; 08/15/1988: EYE SURGERY; Left     Comment:  3 Surgeries on left eye to correct crossed eye 08/15/2001: LAPAROSCOPY; Left     Comment:  Fallopian Tube 08/15/1996: MANDIBLE SURGERY 08/15/1985: OTHER SURGICAL HISTORY; Right     Comment:  3 Surgeries to repair broken arm No date: OTHER SURGICAL HISTORY; Left     Comment:  3 surgeries on her left thigh as an infant 08/15/1996: OTHER SURGICAL HISTORY     Comment:  Jaw surgery 08/15/1985: Right arm surgery  Comment:  x 3 due to fracture 06/13/2019: TOENAIL EXCISION; Bilateral     Comment:  Procedure: BILATERAL SECOND TOE PARTIAL NAIL ABLATION;                Surgeon: Erle Crocker, MD;  Location: [facility];  Service: Orthopedics;  Laterality:               Bilateral;  SURGERY REQUEST TIME 1 HOUR 12/13/2002: TONSILLECTOMY No date: TONSILLECTOMY BMI    Body Mass Index: 24.64 kg/m     Reproductive/Obstetrics negative OB ROS                             Anesthesia Physical Anesthesia Plan  ASA: 3  Anesthesia Plan: General   Post-op Pain Management:    Induction:   PONV Risk Score and Plan:   Airway Management  Planned:   Additional Equipment:   Intra-op Plan:   Post-operative Plan:   Informed Consent: I have reviewed the patients History and Physical, chart, labs and discussed the procedure including the risks, benefits and alternatives for the proposed anesthesia with the patient or authorized representative who has indicated his/her understanding and acceptance.     Dental Advisory Given  Plan Discussed with: CRNA  Anesthesia Plan Comments:        Anesthesia Quick Evaluation

## 2022-07-21 ENCOUNTER — Telehealth: Payer: Self-pay

## 2022-07-21 NOTE — Telephone Encounter (Signed)
     Patient  visit on 11/27  at [place] Have you been able to follow up with your primary care physician? Yes   The patient was or was not able to obtain any needed medicine or equipment. Yes   Are there diet recommendations that you are having difficulty following? Na   Patient expresses understanding of discharge instructions and education provided has no other needs at this time.  Yes     [place], [facility], Care Management  [phone] [address], [place], [place] [address] Phone: [phone] Email: [email or web address]

## 2022-08-02 DIAGNOSIS — L649 Androgenic alopecia, unspecified: Secondary | ICD-10-CM | POA: Diagnosis not present

## 2022-08-03 LAB — CBC WITH DIFFERENTIAL/PLATELET
Basophils Absolute: 0 10*3/uL (ref 0.0–0.2)
Basos: 1 %
EOS (ABSOLUTE): 0.4 10*3/uL (ref 0.0–0.4)
Eos: 7 %
Hematocrit: 36.4 % (ref 34.0–46.6)
Hemoglobin: 12 g/dL (ref 11.1–15.9)
Immature Grans (Abs): 0 10*3/uL (ref 0.0–0.1)
Immature Granulocytes: 0 %
Lymphocytes Absolute: 2.1 10*3/uL (ref 0.7–3.1)
Lymphs: 38 %
MCH: 27.9 pg (ref 26.6–33.0)
MCHC: 33 g/dL (ref 31.5–35.7)
MCV: 85 fL (ref 79–97)
Monocytes Absolute: 0.3 10*3/uL (ref 0.1–0.9)
Monocytes: 6 %
Neutrophils Absolute: 2.7 10*3/uL (ref 1.4–7.0)
Neutrophils: 48 %
Platelets: 277 10*3/uL (ref 150–450)
RBC: 4.3 x10E6/uL (ref 3.77–5.28)
RDW: 13 % (ref 11.7–15.4)
WBC: 5.6 10*3/uL (ref 3.4–10.8)

## 2022-08-03 LAB — COMPREHENSIVE METABOLIC PANEL
ALT: 10 IU/L (ref 0–32)
AST: 15 IU/L (ref 0–40)
Albumin/Globulin Ratio: 1.8 (ref 1.2–2.2)
Albumin: 4.1 g/dL (ref 3.9–4.9)
Alkaline Phosphatase: 94 IU/L (ref 44–121)
BUN/Creatinine Ratio: 15 (ref 9–23)
BUN: 16 mg/dL (ref 6–24)
Bilirubin Total: <0.2 mg/dL (ref 0.0–1.2)
CO2: 27 mmol/L (ref 20–29)
Calcium: 9 mg/dL (ref 8.7–10.2)
Chloride: 102 mmol/L (ref 96–106)
Creatinine, Ser: 1.06 mg/dL — ABNORMAL HIGH (ref 0.57–1.00)
Globulin, Total: 2.3 g/dL (ref 1.5–4.5)
Glucose: 92 mg/dL (ref 70–99)
Potassium: 4.6 mmol/L (ref 3.5–5.2)
Sodium: 143 mmol/L (ref 134–144)
Total Protein: 6.4 g/dL (ref 6.0–8.5)
eGFR: 66 mL/min/{1.73_m2} (ref >59)

## 2022-08-03 LAB — TSH: TSH: 3.27 u[IU]/mL (ref 0.450–4.500)

## 2022-08-04 ENCOUNTER — Telehealth: Payer: Self-pay

## 2022-08-04 ENCOUNTER — Other Ambulatory Visit: Payer: Self-pay | Admitting: Family Medicine

## 2022-08-04 DIAGNOSIS — G8929 Other chronic pain: Secondary | ICD-10-CM

## 2022-08-04 MED ORDER — FENTANYL 75 MCG/HR TD PT72: 1.0000 | MEDICATED_PATCH | TRANSDERMAL | 0 refills | Status: DC

## 2022-08-04 NOTE — Telephone Encounter (Signed)
-----   Message from Ralene Bathe, MD sent at 08/03/2022  3:58 PM EST ----- Labs from 08/02/2022 look OK. Chemistries including liver and kidney are OK. CBC/Blood counts OK. Thyroid / TSH is normal.  No evidence of laboratory cause for hair loss. Recommend Biotin and Rogaine Can consider other options if above not helpful.

## 2022-08-04 NOTE — Telephone Encounter (Signed)
Requested medication (s) are due for refill today yes  Requested medication (s) are on the active medication list -yes  Future visit scheduled -yes  Last refill: 07/01/22 #10  Notes to clinic: non delegated Rx  Requested Prescriptions  Pending Prescriptions Disp Refills   fentaNYL (DURAGESIC) 75 MCG/HR 10 patch 0    Sig: Place 1 patch onto the skin every 3 (three) days.     Not Delegated - Analgesics:  Opioid Agonists Failed - 08/04/2022  9:54 AM      Failed - This refill cannot be delegated      Passed - Urine Drug Screen completed in last 360 days      Passed - Valid encounter within last 3 months    Recent Outpatient Visits           1 month ago Ehlers-Danlos syndrome   [facility] Birdie Sons, MD   2 months ago Tinea pedis of right foot   [facility] [place], [place], PA-C   2 months ago Need for influenza vaccination   [facility] Birdie Sons, MD   4 months ago Pain in toes of both feet   [facility] Birdie Sons, MD   6 months ago Chronic bilateral back pain, unspecified back location   [place], Kirstie Peri, MD       Future Appointments             In 1 month Fisher, Kirstie Peri, MD [facility], [place]   In 4 months Ralene Bathe, MD [place]   In 10 months Ralene Bathe, MD [place]               Requested Prescriptions  Pending Prescriptions Disp Refills   fentaNYL (DURAGESIC) 75 MCG/HR 10 patch 0    Sig: Place 1 patch onto the skin every 3 (three) days.     Not Delegated - Analgesics:  Opioid Agonists Failed - 08/04/2022  9:54 AM      Failed - This refill cannot be delegated      Passed - Urine Drug Screen completed in last 360 days      Passed - Valid encounter within last 3 months    Recent Outpatient Visits           1 month ago Ehlers-Danlos syndrome   [place], MD    2 months ago Tinea pedis of right foot   [facility] [place], [place], PA-C   2 months ago Need for influenza vaccination   [facility] Birdie Sons, MD   4 months ago Pain in toes of both feet   [facility] Birdie Sons, MD   6 months ago Chronic bilateral back pain, unspecified back location   [facility] Birdie Sons, MD       Future Appointments             In 1 month Fisher, Kirstie Peri, MD [facility], [place]   In 4 months Ralene Bathe, MD [place]   In 10 months Ralene Bathe, MD [place]

## 2022-08-04 NOTE — Telephone Encounter (Signed)
Medication Refill - Medication: fentaNYL ([place]) 75 MCG/HR [[id number]]    Has the patient contacted their pharmacy? No. (Agent: If no, request that the patient contact the pharmacy for the refill. If patient does not wish to contact the pharmacy document the reason why and proceed with request.) (Agent: If yes, when and what did the pharmacy advise?)  Preferred Pharmacy (with phone number or street name): [name] [place] Has the patient been seen for an appointment in the last year OR does the patient have an upcoming appointment? Yes.    Agent: Please be advised that RX refills may take up to 3 business days. We ask that you follow-up with your pharmacy.

## 2022-08-04 NOTE — Telephone Encounter (Signed)
Patient informed of lab results. 

## 2022-08-12 DIAGNOSIS — F331 Major depressive disorder, recurrent, moderate: Secondary | ICD-10-CM | POA: Diagnosis not present

## 2022-08-12 DIAGNOSIS — F411 Generalized anxiety disorder: Secondary | ICD-10-CM | POA: Diagnosis not present

## 2022-08-12 DIAGNOSIS — G2569 Other tics of organic origin: Secondary | ICD-10-CM | POA: Diagnosis not present

## 2022-08-16 ENCOUNTER — Other Ambulatory Visit: Payer: Self-pay | Admitting: Psychiatry

## 2022-08-16 DIAGNOSIS — R200 Anesthesia of skin: Secondary | ICD-10-CM

## 2022-08-17 ENCOUNTER — Encounter: Payer: Self-pay | Admitting: Anesthesiology

## 2022-08-17 ENCOUNTER — Ambulatory Visit: Payer: Self-pay | Admitting: Anesthesiology

## 2022-08-17 ENCOUNTER — Ambulatory Visit
Admission: RE | Admit: 2022-08-17 | Discharge: 2022-08-17 | Disposition: A | Discharge status: Home / Self Care | Payer: Medicare Other | Source: Ambulatory Visit | Attending: Psychiatry | Admitting: Psychiatry

## 2022-08-17 DIAGNOSIS — F418 Other specified anxiety disorders: Secondary | ICD-10-CM | POA: Diagnosis not present

## 2022-08-17 DIAGNOSIS — G4730 Sleep apnea, unspecified: Secondary | ICD-10-CM | POA: Diagnosis not present

## 2022-08-17 DIAGNOSIS — F333 Major depressive disorder, recurrent, severe with psychotic symptoms: Secondary | ICD-10-CM

## 2022-08-17 DIAGNOSIS — R569 Unspecified convulsions: Secondary | ICD-10-CM | POA: Insufficient documentation

## 2022-08-17 DIAGNOSIS — R200 Anesthesia of skin: Secondary | ICD-10-CM

## 2022-08-17 DIAGNOSIS — J4530 Mild persistent asthma, uncomplicated: Secondary | ICD-10-CM | POA: Diagnosis not present

## 2022-08-17 DIAGNOSIS — G40909 Epilepsy, unspecified, not intractable, without status epilepticus: Secondary | ICD-10-CM | POA: Diagnosis present

## 2022-08-17 MED ORDER — KETAMINE HCL 50 MG/5ML IJ SOSY
PREFILLED_SYRINGE | INTRAMUSCULAR | Status: AC
  Filled 2022-08-17: qty 5

## 2022-08-17 MED ORDER — MIDAZOLAM HCL 2 MG/2ML IJ SOLN
INTRAMUSCULAR | Status: AC
  Filled 2022-08-17: qty 2

## 2022-08-17 MED ORDER — MIDAZOLAM HCL 5 MG/5ML IJ SOLN
INTRAMUSCULAR | Status: DC | PRN
  Administered 2022-08-17: 2 mg via INTRAVENOUS

## 2022-08-17 MED ORDER — SUCCINYLCHOLINE CHLORIDE 200 MG/10ML IV SOSY
PREFILLED_SYRINGE | INTRAVENOUS | Status: AC
  Filled 2022-08-17: qty 10

## 2022-08-17 MED ORDER — LABETALOL HCL 5 MG/ML IV SOLN
INTRAVENOUS | Status: DC | PRN
  Administered 2022-08-17: 5 mg via INTRAVENOUS

## 2022-08-17 MED ORDER — PROPOFOL 10 MG/ML IV BOLUS
INTRAVENOUS | Status: DC | PRN
  Administered 2022-08-17: 40 mg via INTRAVENOUS

## 2022-08-17 MED ORDER — KETAMINE HCL 10 MG/ML IJ SOLN
INTRAMUSCULAR | Status: DC | PRN
  Administered 2022-08-17: 40 mg via INTRAVENOUS

## 2022-08-17 MED ORDER — SUCCINYLCHOLINE CHLORIDE 200 MG/10ML IV SOSY
PREFILLED_SYRINGE | INTRAVENOUS | Status: DC | PRN
  Administered 2022-08-17: 60 mg via INTRAVENOUS

## 2022-08-17 MED ORDER — MIDAZOLAM HCL 2 MG/2ML IJ SOLN: 2.0000 mg | Freq: Once | INTRAMUSCULAR | Status: DC

## 2022-08-17 MED ORDER — LABETALOL HCL 5 MG/ML IV SOLN
INTRAVENOUS | Status: AC
  Filled 2022-08-17: qty 4

## 2022-08-17 MED ORDER — SODIUM CHLORIDE 0.9 % IV SOLN
500.0000 mL | Freq: Once | INTRAVENOUS | Status: AC
  Administered 2022-08-17: 500 mL via INTRAVENOUS

## 2022-08-17 NOTE — Anesthesia Postprocedure Evaluation (Signed)
Anesthesia Post Note  Patient: Karen [name]  Procedure(s) Performed: ECT TX  Patient location during evaluation: PACU Anesthesia Type: General Level of consciousness: awake Pain management: pain level controlled Vital Signs Assessment: post-procedure vital signs reviewed and stable Respiratory status: spontaneous breathing and nonlabored ventilation Cardiovascular status: stable Anesthetic complications: no  No notable events documented.   Last Vitals:  Vitals:   08/17/22 1404 08/17/22 1415  BP: 121/83 109/78  Pulse: 83 79  Resp: 18 16  Temp:  (!) 36.2 C  SpO2: 95% 100%    Last Pain:  Vitals:   08/17/22 1352  TempSrc: Temporal  PainSc: 0-No pain                 VAN STAVEREN,Chason Mciver

## 2022-08-17 NOTE — Procedures (Signed)
ECT SERVICES Physician's Interval Evaluation & Treatment Note  Patient Identification: Karen [name] MRN:  [id number] Date of Evaluation:  08/17/2022 TX #: 11  MADRS:   MMSE:   P.E. Findings:  Unremarkable physical exam.  No change  Psychiatric Interval Note:  Reports mood is good and denies hallucinations  Subjective:  Patient is a 47 y.o. female seen for evaluation for Electroconvulsive Therapy. No new complaints  Treatment Summary:   '[]'$   Right Unilateral             '[x]'$  Bilateral   % Energy : 1.0 ms 40%   Impedance: 980 ohms  Seizure Energy Index: 3991 V squared  Postictal Suppression Index:    Seizure Concordance Index: 94%  Medications  Pre Shock: Labetalol 20 mg propofol 60 mg succinylcholine 60 mg  Post Shock: Versed 2 mg  Seizure Duration: 28 seconds EMG 56 seconds EEG   Comments: Follow-up 4 weeks  Lungs:  '[x]'$   Clear to auscultation               '[]'$  Other:   Heart:    '[x]'$   Regular rhythm             '[]'$  irregular rhythm    '[x]'$   Previous H&P reviewed, patient examined and there are NO CHANGES                 '[]'$   Previous H&P reviewed, patient examined and there are changes noted.   Karen Berthold, MD 1/3/20243:30 PM

## 2022-08-17 NOTE — Transfer of Care (Signed)
Immediate Anesthesia Transfer of Care Note  Patient: Karen [name]  Procedure(s) Performed: ECT TX  Patient Location: PACU  Anesthesia Type:General  Level of Consciousness: drowsy  Airway & Oxygen Therapy: Patient Spontanous Breathing  Post-op Assessment: Report given to RN and Post -op Vital signs reviewed and stable  Post vital signs: Reviewed  Last Vitals:  Vitals Value Taken Time  BP    Temp    Pulse    Resp    SpO2      Last Pain:  Vitals:   08/17/22 1037  TempSrc:   PainSc: 0-No pain         Complications: No notable events documented.

## 2022-08-17 NOTE — Anesthesia Preprocedure Evaluation (Signed)
Anesthesia Evaluation  Patient identified by MRN, date of birth, ID band Patient awake    Reviewed: Allergy & Precautions, NPO status , Patient's Chart, lab work & pertinent test results  Airway Mallampati: II  TM Distance: >3 FB Neck ROM: Full    Dental  (+) Poor Dentition   Pulmonary neg pulmonary ROS, asthma , sleep apnea    Pulmonary exam normal breath sounds clear to auscultation       Cardiovascular Exercise Tolerance: Good negative cardio ROS Normal cardiovascular exam Rhythm:Regular     Neuro/Psych  Headaches, Seizures -,   Anxiety Depression    negative neurological ROS  negative psych ROS   GI/Hepatic negative GI ROS, Neg liver ROS,,,  Endo/Other  negative endocrine ROS    Renal/GU negative Renal ROS  negative genitourinary   Musculoskeletal  (+) Arthritis ,  Fibromyalgia -  Abdominal Normal abdominal exam  (+)   Peds negative pediatric ROS (+)  Hematology negative hematology ROS (+)   Anesthesia Other Findings Past Medical History: No date: Ehlers-Danlos syndrome No date: Fibromyalgia No date: Headache No date: Polyp of descending colon No date: Sleep apnea No date: Tics of organic origin No date: Transient alteration of awareness  Past Surgical History: 02/13/2018: COLONOSCOPY WITH PROPOFOL; N/A     Comment:  Procedure: COLONOSCOPY WITH PROPOFOL;  Surgeon: [name], MD;  Location: [facility] ENDOSCOPY;  Service:               Endoscopy;  Laterality: N/A; 01/11/2022: COLONOSCOPY WITH PROPOFOL; N/A     Comment:  Procedure: COLONOSCOPY WITH PROPOFOL;  Surgeon: [name], MD;  Location: [facility] ENDOSCOPY;  Service:               Endoscopy;  Laterality: N/A; 08/16/2007: DILATION AND CURETTAGE OF UTERUS 02/13/2018: ESOPHAGOGASTRODUODENOSCOPY (EGD) WITH PROPOFOL     Comment:  Procedure: ESOPHAGOGASTRODUODENOSCOPY (EGD) WITH               PROPOFOL;  Surgeon: [name], MD;   Location: [facility]               ENDOSCOPY;  Service: Endoscopy;; 08/15/1988: EYE SURGERY; Left     Comment:  3 Surgeries on left eye to correct crossed eye 08/15/2001: LAPAROSCOPY; Left     Comment:  Fallopian Tube 08/15/1996: MANDIBLE SURGERY 08/15/1985: OTHER SURGICAL HISTORY; Right     Comment:  3 Surgeries to repair broken arm No date: OTHER SURGICAL HISTORY; Left     Comment:  3 surgeries on her left thigh as an infant 08/15/1996: OTHER SURGICAL HISTORY     Comment:  Jaw surgery 08/15/1985: Right arm surgery     Comment:  x 3 due to fracture 06/13/2019: TOENAIL EXCISION; Bilateral     Comment:  Procedure: BILATERAL SECOND TOE PARTIAL NAIL ABLATION;                Surgeon: Erle Crocker, MD;  Location: [place];  Service: Orthopedics;  Laterality:               Bilateral;  SURGERY REQUEST TIME 1 HOUR 12/13/2002: TONSILLECTOMY No date: TONSILLECTOMY  BMI    Body Mass Index: 26.61 kg/m      Reproductive/Obstetrics negative OB ROS  Anesthesia Physical Anesthesia Plan  ASA: 3  Anesthesia Plan: General   Post-op Pain Management:    Induction: Intravenous  PONV Risk Score and Plan: Propofol infusion and TIVA  Airway Management Planned: Natural Airway  Additional Equipment:   Intra-op Plan:   Post-operative Plan:   Informed Consent: I have reviewed the patients History and Physical, chart, labs and discussed the procedure including the risks, benefits and alternatives for the proposed anesthesia with the patient or authorized representative who has indicated his/her understanding and acceptance.     Dental Advisory Given  Plan Discussed with: CRNA and Surgeon  Anesthesia Plan Comments:        Anesthesia Quick Evaluation

## 2022-08-17 NOTE — H&P (Signed)
Karen [name] is an 47 y.o. female.   Chief Complaint: no new complaint HPI: baseline medical status  Past Medical History:  Diagnosis Date   Ehlers-Danlos syndrome    Fibromyalgia    Headache    Polyp of descending colon    Sleep apnea    Tics of organic origin    Transient alteration of awareness     Past Surgical History:  Procedure Laterality Date   COLONOSCOPY WITH PROPOFOL N/A 02/13/2018   Procedure: COLONOSCOPY WITH PROPOFOL;  Surgeon: [name], MD;  Location: [facility] ENDOSCOPY;  Service: Endoscopy;  Laterality: N/A;   COLONOSCOPY WITH PROPOFOL N/A 01/11/2022   Procedure: COLONOSCOPY WITH PROPOFOL;  Surgeon: [name], MD;  Location[facility] ENDOSCOPY;  Service: Endoscopy;  Laterality: N/A;   DILATION AND CURETTAGE OF UTERUS  08/16/2007   ESOPHAGOGASTRODUODENOSCOPY (EGD) WITH PROPOFOL  02/13/2018   Procedure: ESOPHAGOGASTRODUODENOSCOPY (EGD) WITH PROPOFOL;  Surgeon: [name], MD;  Location: [place] ENDOSCOPY;  Service: Endoscopy;;   EYE SURGERY Left 08/15/1988   3 Surgeries on left eye to correct crossed eye   LAPAROSCOPY Left 08/15/2001   Fallopian Tube   MANDIBLE SURGERY  08/15/1996   OTHER SURGICAL HISTORY Right 08/15/1985   3 Surgeries to repair broken arm   OTHER SURGICAL HISTORY Left    3 surgeries on her left thigh as an infant   OTHER SURGICAL HISTORY  08/15/1996   Jaw surgery   Right arm surgery  08/15/1985   x 3 due to fracture   TOENAIL EXCISION Bilateral 06/13/2019   Procedure: BILATERAL SECOND TOE PARTIAL NAIL ABLATION;  Surgeon: Erle Crocker, MD;  Location: [place];  Service: Orthopedics;  Laterality: Bilateral;  SURGERY REQUEST TIME 1 HOUR   TONSILLECTOMY  12/13/2002   TONSILLECTOMY      Family History  Problem Relation Age of Onset   Depression Mother    Stroke Mother    Fibromyalgia Mother    Osteoarthritis Mother    Asthma Brother    Depression Brother    Migraines Brother    Asperger's syndrome Brother     Other Brother        Ehlers-Danlos Syndrome   Cancer Maternal Aunt    Asperger's syndrome Maternal Aunt    Breast cancer Maternal Aunt    Heart disease Paternal Aunt    Heart disease Paternal Uncle    Cervical cancer Maternal Grandmother    Osteoporosis Maternal Grandmother        Died at 75   Osteoarthritis Maternal Grandmother    Colon cancer Maternal Grandfather    Asthma Maternal Grandfather    Asperger's syndrome Maternal Grandfather    Emphysema Maternal Grandfather        Died at 95   Prostate cancer Maternal Grandfather    Heart attack Paternal Grandfather        Died at 21   Asthma Paternal Grandfather    Tuberculosis Paternal Grandfather    Cancer Cousin    Asperger's syndrome Cousin    Asperger's syndrome Other    Heart Problems Paternal Grandmother        Died at 79   Ehlers-Danlos syndrome Father    Ehlers-Danlos syndrome Brother    Social History:  reports that she has never smoked. She has never used smokeless tobacco. She reports that she does not drink alcohol and does not use drugs.  Allergies:  Allergies  Allergen Reactions   Augmentin  [Amoxicillin-Pot Clavulanate] Diarrhea   Chocolate Flavor  Other reaction(s): Other (See Comments) Wheezing, acne   Demerol [Meperidine] Other (See Comments)    Slow to wake up when this drug is given.    Keflex [Cephalexin] Nausea And Vomiting   Morphine And Related Nausea And Vomiting   Tape Dermatitis    Must use paper tape only   Toradol [Ketorolac Tromethamine] Nausea And Vomiting   Amoxicillin     Other reaction(s): Unknown   Chocolate     GI distress   Nsaids     patient develops ulcers Other reaction(s): Other (See Comments) patient develops ulcers   Strawberry Extract     GI distress    (Not in a hospital admission)   No results found for this or any previous visit (from the past 48 hour(s)). No results found.  Review of Systems  Constitutional: Negative.   HENT: Negative.    Eyes:  Negative.   Respiratory: Negative.    Cardiovascular: Negative.   Gastrointestinal: Negative.   Musculoskeletal: Negative.   Skin: Negative.   Neurological: Negative.   Psychiatric/Behavioral: Negative.    All other systems reviewed and are negative.   Blood pressure 111/78, pulse 69, temperature 98.3 F (36.8 C), temperature source Oral, resp. rate 16, height 4' 11.5" (1.511 m), weight 60.8 kg, SpO2 98 %. Physical Exam Vitals and nursing note reviewed.  Constitutional:      Appearance: She is well-developed.  HENT:     Head: Normocephalic and atraumatic.  Eyes:     Conjunctiva/sclera: Conjunctivae normal.     Pupils: Pupils are equal, round, and reactive to light.  Cardiovascular:     Heart sounds: Normal heart sounds.  Pulmonary:     Effort: Pulmonary effort is normal.  Abdominal:     Palpations: Abdomen is soft.  Musculoskeletal:        General: Normal range of motion.     Cervical back: Normal range of motion.  Skin:    General: Skin is warm and dry.  Neurological:     Mental Status: She is alert.  Psychiatric:        Mood and Affect: Mood normal.        Thought Content: Thought content normal.      Assessment/Plan Maintenance ect today  Alethia Berthold, MD 08/17/2022, 12:14 PM

## 2022-08-18 ENCOUNTER — Other Ambulatory Visit: Payer: Self-pay | Admitting: Family Medicine

## 2022-08-18 ENCOUNTER — Other Ambulatory Visit (INDEPENDENT_AMBULATORY_CARE_PROVIDER_SITE_OTHER): Payer: Self-pay | Admitting: Family

## 2022-08-18 DIAGNOSIS — M797 Fibromyalgia: Secondary | ICD-10-CM

## 2022-08-18 DIAGNOSIS — F411 Generalized anxiety disorder: Secondary | ICD-10-CM | POA: Diagnosis not present

## 2022-08-18 DIAGNOSIS — G2569 Other tics of organic origin: Secondary | ICD-10-CM | POA: Diagnosis not present

## 2022-08-18 DIAGNOSIS — F331 Major depressive disorder, recurrent, moderate: Secondary | ICD-10-CM | POA: Diagnosis not present

## 2022-08-18 DIAGNOSIS — Q796 Ehlers-Danlos syndrome, unspecified: Secondary | ICD-10-CM

## 2022-08-19 NOTE — Telephone Encounter (Signed)
Requested medications are due for refill today.  Unsure  Requested medications are on the active medications list.  yes  Last refill. 03/17/2022 #90 3 rf  Future visit scheduled.   yes  Notes to clinic.  Please review sig. On request from today it is 3x daily. On request from yesterday, it is 2 in the morning  and 3 at bedtime.    Requested Prescriptions  Pending Prescriptions Disp Refills   gabapentin (NEURONTIN) 800 MG tablet [Pharmacy Med Name: GABAPENTIN 800 MG TABLET] 90 tablet 3    Sig: TAKE 1 TABLET BY MOUTH THREE TIMES A DAY     Neurology: Anticonvulsants - gabapentin Failed - 08/18/2022  7:46 PM      Failed - Cr in normal range and within 360 days    Creatinine  Date Value Ref Range Status  02/19/2014 0.82 0.60 - 1.30 mg/dL Final   Creatinine, Ser  Date Value Ref Range Status  08/02/2022 1.06 (H) 0.57 - 1.00 mg/dL Final         Passed - Completed PHQ-2 or PHQ-9 in the last 360 days      Passed - Valid encounter within last 12 months    Recent Outpatient Visits           1 month ago Ehlers-Danlos syndrome   [place], MD   3 months ago Tinea pedis of right foot   [facility] [place], [place], PA-C   3 months ago Need for influenza vaccination   [facility] Birdie Sons, MD   5 months ago Pain in toes of both feet   [facility] Birdie Sons, MD   7 months ago Chronic bilateral back pain, unspecified back location   [place], Kirstie Peri, MD       Future Appointments             In 2 weeks Caryn Section, Kirstie Peri, MD [facility], [place]   In 3 months Ralene Bathe, MD [place]   In 10 months Ralene Bathe, MD [place]

## 2022-08-19 NOTE — Telephone Encounter (Signed)
Request refused appears ordered by PCP at 800 mg not 600 mg

## 2022-09-02 ENCOUNTER — Other Ambulatory Visit: Payer: Self-pay | Admitting: Family Medicine

## 2022-09-02 DIAGNOSIS — G8929 Other chronic pain: Secondary | ICD-10-CM

## 2022-09-02 NOTE — Telephone Encounter (Signed)
Pt called in to request a refill forfentaNYL ([place]) 75 MCG/HR   Pharmacy: [name]: [phone] Fax: [phone]   Pt says that she will be using her last patch today.   Pt was an appt on 09/05/22 with PCP

## 2022-09-02 NOTE — Telephone Encounter (Signed)
Requested medication (s) are due for refill today: Yes  Requested medication (s) are on the active medication list: Yes  Last refill:  08/04/22  Future visit scheduled: Yes  Notes to clinic:  Unable to refill per protocol, cannot delegate.      Requested Prescriptions  Pending Prescriptions Disp Refills   fentaNYL (DURAGESIC) 75 MCG/HR 10 patch 0    Sig: Place 1 patch onto the skin every 3 (three) days.     Not Delegated - Analgesics:  Opioid Agonists Failed - 09/02/2022  1:00 PM      Failed - This refill cannot be delegated      Passed - Urine Drug Screen completed in last 360 days      Passed - Valid encounter within last 3 months    Recent Outpatient Visits           1 month ago Ehlers-Danlos syndrome   [place], MD   3 months ago Tinea pedis of right foot   [place], [place], PA-C   3 months ago Need for influenza vaccination   [facility] Birdie Sons, MD   5 months ago Pain in toes of both feet   [place], Donald E, MD   7 months ago Chronic bilateral back pain, unspecified back location   [place], Kirstie Peri, MD       Future Appointments             In 3 days [place], Kirstie Peri, MD [facility], [place]   In 3 months Ralene Bathe, MD [place]   In 9 months Ralene Bathe, MD [place]

## 2022-09-03 MED ORDER — FENTANYL 75 MCG/HR TD PT72: 1.0000 | MEDICATED_PATCH | TRANSDERMAL | 0 refills | Status: DC

## 2022-09-05 ENCOUNTER — Ambulatory Visit: Payer: Medicare Other | Admitting: Family Medicine

## 2022-09-09 DIAGNOSIS — F331 Major depressive disorder, recurrent, moderate: Secondary | ICD-10-CM | POA: Diagnosis not present

## 2022-09-09 DIAGNOSIS — F411 Generalized anxiety disorder: Secondary | ICD-10-CM | POA: Diagnosis not present

## 2022-09-09 DIAGNOSIS — G2569 Other tics of organic origin: Secondary | ICD-10-CM | POA: Diagnosis not present

## 2022-09-12 ENCOUNTER — Encounter: Payer: Self-pay | Admitting: Certified Registered"

## 2022-09-12 ENCOUNTER — Other Ambulatory Visit: Payer: Self-pay | Admitting: Psychiatry

## 2022-09-12 ENCOUNTER — Encounter
Admission: RE | Admit: 2022-09-12 | Discharge: 2022-09-12 | Disposition: A | Discharge status: Home / Self Care | Payer: Medicare Other | Source: Ambulatory Visit | Attending: Family Medicine | Admitting: Family Medicine

## 2022-09-12 DIAGNOSIS — G4730 Sleep apnea, unspecified: Secondary | ICD-10-CM | POA: Diagnosis not present

## 2022-09-12 DIAGNOSIS — R569 Unspecified convulsions: Secondary | ICD-10-CM | POA: Diagnosis not present

## 2022-09-12 DIAGNOSIS — R200 Anesthesia of skin: Secondary | ICD-10-CM | POA: Diagnosis not present

## 2022-09-12 DIAGNOSIS — F333 Major depressive disorder, recurrent, severe with psychotic symptoms: Secondary | ICD-10-CM

## 2022-09-12 DIAGNOSIS — F323 Major depressive disorder, single episode, severe with psychotic features: Secondary | ICD-10-CM | POA: Diagnosis not present

## 2022-09-12 DIAGNOSIS — J4530 Mild persistent asthma, uncomplicated: Secondary | ICD-10-CM | POA: Diagnosis not present

## 2022-09-12 DIAGNOSIS — T7840XA Allergy, unspecified, initial encounter: Secondary | ICD-10-CM | POA: Diagnosis not present

## 2022-09-12 DIAGNOSIS — F418 Other specified anxiety disorders: Secondary | ICD-10-CM | POA: Diagnosis not present

## 2022-09-12 MED ORDER — MIDAZOLAM HCL 2 MG/2ML IJ SOLN
INTRAMUSCULAR | Status: DC | PRN
  Administered 2022-09-12: 2 mg via INTRAVENOUS

## 2022-09-12 MED ORDER — MIDAZOLAM HCL 2 MG/2ML IJ SOLN: 2.0000 mg | Freq: Once | INTRAMUSCULAR | Status: DC

## 2022-09-12 MED ORDER — OXYCODONE HCL 5 MG PO TABS: 5.0000 mg | ORAL_TABLET | Freq: Once | ORAL | Status: DC | PRN

## 2022-09-12 MED ORDER — ONDANSETRON HCL 4 MG/2ML IJ SOLN: 4.0000 mg | Freq: Once | INTRAMUSCULAR | Status: DC

## 2022-09-12 MED ORDER — KETAMINE HCL 10 MG/ML IJ SOLN
INTRAMUSCULAR | Status: DC | PRN
  Administered 2022-09-12: 30 mg via INTRAVENOUS

## 2022-09-12 MED ORDER — PROPOFOL 10 MG/ML IV BOLUS
INTRAVENOUS | Status: DC | PRN
  Administered 2022-09-12: 40 mg via INTRAVENOUS

## 2022-09-12 MED ORDER — SODIUM CHLORIDE 0.9 % IV SOLN: 500.0000 mL | Freq: Once | INTRAVENOUS | Status: AC

## 2022-09-12 MED ORDER — SUCCINYLCHOLINE CHLORIDE 200 MG/10ML IV SOSY
PREFILLED_SYRINGE | INTRAVENOUS | Status: DC | PRN
  Administered 2022-09-12: 60 mg via INTRAVENOUS

## 2022-09-12 MED ORDER — OXYCODONE HCL 5 MG/5ML PO SOLN: 5.0000 mg | Freq: Once | ORAL | Status: DC | PRN

## 2022-09-12 MED ORDER — LABETALOL HCL 5 MG/ML IV SOLN
INTRAVENOUS | Status: DC | PRN
  Administered 2022-09-12: 5 mg via INTRAVENOUS

## 2022-09-12 MED ORDER — KETAMINE HCL 50 MG/5ML IJ SOSY
PREFILLED_SYRINGE | INTRAMUSCULAR | Status: AC
  Filled 2022-09-12: qty 5

## 2022-09-12 MED ORDER — MIDAZOLAM HCL 2 MG/2ML IJ SOLN
INTRAMUSCULAR | Status: AC
  Filled 2022-09-12: qty 2

## 2022-09-12 NOTE — Transfer of Care (Signed)
Immediate Anesthesia Transfer of Care Note  Patient: Karen [name]  Procedure(s) Performed: ECT TX  Patient Location: PACU  Anesthesia Type:General  Level of Consciousness: drowsy and patient cooperative  Airway & Oxygen Therapy: Patient Spontanous Breathing and Patient connected to face mask oxygen  Post-op Assessment: Report given to RN and Post -op Vital signs reviewed and stable  Post vital signs: Reviewed and stable  Last Vitals:  Vitals Value Taken Time  BP 113/76 09/12/22 1250  Temp 37 C 09/12/22 1250  Pulse 75 09/12/22 1251  Resp 12 09/12/22 1251  SpO2 100 % 09/12/22 1251  Vitals shown include unvalidated device data.  Last Pain:  Vitals:   09/12/22 1040  TempSrc:   PainSc: 0-No pain         Complications: No notable events documented.

## 2022-09-12 NOTE — Anesthesia Preprocedure Evaluation (Signed)
Anesthesia Evaluation  Patient identified by MRN, date of birth, ID band Patient awake    Reviewed: Allergy & Precautions, NPO status , Patient's Chart, lab work & pertinent test results  Airway Mallampati: II  TM Distance: >3 FB Neck ROM: Full    Dental  (+) Poor Dentition   Pulmonary neg pulmonary ROS, asthma , sleep apnea    Pulmonary exam normal breath sounds clear to auscultation       Cardiovascular Exercise Tolerance: Good negative cardio ROS Normal cardiovascular exam Rhythm:Regular     Neuro/Psych  Headaches, Seizures -,   Anxiety Depression    negative neurological ROS  negative psych ROS   GI/Hepatic negative GI ROS, Neg liver ROS,,,  Endo/Other  negative endocrine ROS    Renal/GU negative Renal ROS  negative genitourinary   Musculoskeletal  (+) Arthritis ,  Fibromyalgia -  Abdominal Normal abdominal exam  (+)   Peds negative pediatric ROS (+)  Hematology negative hematology ROS (+)   Anesthesia Other Findings Past Medical History: No date: Ehlers-Danlos syndrome No date: Fibromyalgia No date: Headache No date: Polyp of descending colon No date: Sleep apnea No date: Tics of organic origin No date: Transient alteration of awareness  Past Surgical History: 02/13/2018: COLONOSCOPY WITH PROPOFOL; N/A     Comment:  Procedure: COLONOSCOPY WITH PROPOFOL;  Surgeon: [name], MD;  Location: [facility] ENDOSCOPY;  Service:               Endoscopy;  Laterality: N/A; 01/11/2022: COLONOSCOPY WITH PROPOFOL; N/A     Comment:  Procedure: COLONOSCOPY WITH PROPOFOL;  Surgeon: [name], MD;  Location: [facility] ENDOSCOPY;  Service:               Endoscopy;  Laterality: N/A; 08/16/2007: DILATION AND CURETTAGE OF UTERUS 02/13/2018: ESOPHAGOGASTRODUODENOSCOPY (EGD) WITH PROPOFOL     Comment:  Procedure: ESOPHAGOGASTRODUODENOSCOPY (EGD) WITH               PROPOFOL;  Surgeon: [name], MD;   Location: [facility]               ENDOSCOPY;  Service: Endoscopy;; 08/15/1988: EYE SURGERY; Left     Comment:  3 Surgeries on left eye to correct crossed eye 08/15/2001: LAPAROSCOPY; Left     Comment:  Fallopian Tube 08/15/1996: MANDIBLE SURGERY 08/15/1985: OTHER SURGICAL HISTORY; Right     Comment:  3 Surgeries to repair broken arm No date: OTHER SURGICAL HISTORY; Left     Comment:  3 surgeries on her left thigh as an infant 08/15/1996: OTHER SURGICAL HISTORY     Comment:  Jaw surgery 08/15/1985: Right arm surgery     Comment:  x 3 due to fracture 06/13/2019: TOENAIL EXCISION; Bilateral     Comment:  Procedure: BILATERAL SECOND TOE PARTIAL NAIL ABLATION;                Surgeon: Erle Crocker, MD;  Location: [place];  Service: Orthopedics;  Laterality:               Bilateral;  SURGERY REQUEST TIME 1 HOUR 12/13/2002: TONSILLECTOMY No date: TONSILLECTOMY  BMI    Body Mass Index: 26.61 kg/m      Reproductive/Obstetrics negative OB ROS  Anesthesia Physical Anesthesia Plan  ASA: 3  Anesthesia Plan: General   Post-op Pain Management:    Induction: Intravenous  PONV Risk Score and Plan: Propofol infusion and TIVA  Airway Management Planned: Natural Airway  Additional Equipment:   Intra-op Plan:   Post-operative Plan:   Informed Consent: I have reviewed the patients History and Physical, chart, labs and discussed the procedure including the risks, benefits and alternatives for the proposed anesthesia with the patient or authorized representative who has indicated his/her understanding and acceptance.     Dental Advisory Given  Plan Discussed with: CRNA and Surgeon  Anesthesia Plan Comments: (Patient consented for risks of anesthesia including but not limited to:  - adverse reactions to medications - risk of airway placement if required - damage to eyes, teeth, lips or other  oral mucosa - nerve damage due to positioning  - sore throat or hoarseness - Damage to heart, brain, nerves, lungs, other parts of body or loss of life  Patient voiced understanding.)       Anesthesia Quick Evaluation

## 2022-09-12 NOTE — Anesthesia Procedure Notes (Signed)
Procedure Name: General with mask airway Date/Time: 09/12/2022 12:55 PM  Performed by: Kelton Pillar, CRNAPre-anesthesia Checklist: Patient identified, Suction available, Emergency Drugs available and Patient being monitored Patient Re-evaluated:Patient Re-evaluated prior to induction Oxygen Delivery Method: Circle system utilized Preoxygenation: Pre-oxygenation with 100% oxygen Induction Type: IV induction Ventilation: Oral airway inserted - appropriate to patient size Airway Equipment and Method: Bite block Placement Confirmation: positive ETCO2, CO2 detector and breath sounds checked- equal and bilateral Dental Injury: Teeth and Oropharynx as per pre-operative assessment

## 2022-09-13 NOTE — Procedures (Signed)
ECT SERVICES Physician's Interval Evaluation & Treatment Note  Patient Identification: Karen [name] MRN:  [id number] Date of Evaluation:  09/12/2022 TX #: 12   MADRS:   MMSE:   P.E. Findings:  Unremarkable unremarkable physical exam  Psychiatric Interval Note:  Mood good no psychosis  Subjective:  Patient is a 47 y.o. female seen for evaluation for Electroconvulsive Therapy. Feeling a little sleepy recently  Treatment Summary:   '[]'$   Right Unilateral             '[x]'$  Bilateral   % Energy : 1.0 ms 40%   Impedance: 1500 ohms  Seizure Energy Index: No reading  Postictal Suppression Index: No reading  Seizure Concordance Index: No reading  Medications  Pre Shock: Zofran 4 mg labetalol 20 mg propofol 40 mg ketamine 40 mg succinylcholine 60 mg  Post Shock: Versed 2 mg  Seizure Duration: 31 seconds EMG 53 seconds EEG   Comments: Follow-up 5 weeks  Lungs:  '[x]'$   Clear to auscultation               '[]'$  Other:   Heart:    '[x]'$   Regular rhythm             '[]'$  irregular rhythm    '[x]'$   Previous H&P reviewed, patient examined and there are NO CHANGES                 '[]'$   Previous H&P reviewed, patient examined and there are changes noted.   Alethia Berthold, MD 1/30/20243:23 PM

## 2022-09-13 NOTE — H&P (Signed)
Karen [name] is an 47 y.o. female.   Chief Complaint: No specific complaint except that she has noticed she feels sleepy quite a bit.  Mood stable.  No psychotic symptoms HPI: Past history of psychotic depression doing well with ECT  Past Medical History:  Diagnosis Date   Ehlers-Danlos syndrome    Fibromyalgia    Headache    Polyp of descending colon    Sleep apnea    Tics of organic origin    Transient alteration of awareness     Past Surgical History:  Procedure Laterality Date   COLONOSCOPY WITH PROPOFOL N/A 02/13/2018   Procedure: COLONOSCOPY WITH PROPOFOL;  Surgeon: [name], MD;  Location: [facility] ENDOSCOPY;  Service: Endoscopy;  Laterality: N/A;   COLONOSCOPY WITH PROPOFOL N/A 01/11/2022   Procedure: COLONOSCOPY WITH PROPOFOL;  Surgeon: [name], MD;  Location: [facility] ENDOSCOPY;  Service: Endoscopy;  Laterality: N/A;   DILATION AND CURETTAGE OF UTERUS  08/16/2007   ESOPHAGOGASTRODUODENOSCOPY (EGD) WITH PROPOFOL  02/13/2018   Procedure: ESOPHAGOGASTRODUODENOSCOPY (EGD) WITH PROPOFOL;  Surgeon: [name], MD;  Location: [facility] ENDOSCOPY;  Service: Endoscopy;;   EYE SURGERY Left 08/15/1988   3 Surgeries on left eye to correct crossed eye   LAPAROSCOPY Left 08/15/2001   Fallopian Tube   MANDIBLE SURGERY  08/15/1996   OTHER SURGICAL HISTORY Right 08/15/1985   3 Surgeries to repair broken arm   OTHER SURGICAL HISTORY Left    3 surgeries on her left thigh as an infant   OTHER SURGICAL HISTORY  08/15/1996   Jaw surgery   Right arm surgery  08/15/1985   x 3 due to fracture   TOENAIL EXCISION Bilateral 06/13/2019   Procedure: BILATERAL SECOND TOE PARTIAL NAIL ABLATION;  Surgeon: Erle Crocker, MD;  Location: [place];  Service: Orthopedics;  Laterality: Bilateral;  SURGERY REQUEST TIME 1 HOUR   TONSILLECTOMY  12/13/2002   TONSILLECTOMY      Family History  Problem Relation Age of Onset   Depression Mother    Stroke Mother    Fibromyalgia  Mother    Osteoarthritis Mother    Asthma Brother    Depression Brother    Migraines Brother    Asperger's syndrome Brother    Other Brother        Ehlers-Danlos Syndrome   Cancer Maternal Aunt    Asperger's syndrome Maternal Aunt    Breast cancer Maternal Aunt    Heart disease Paternal Aunt    Heart disease Paternal Uncle    Cervical cancer Maternal Grandmother    Osteoporosis Maternal Grandmother        Died at 63   Osteoarthritis Maternal Grandmother    Colon cancer Maternal Grandfather    Asthma Maternal Grandfather    Asperger's syndrome Maternal Grandfather    Emphysema Maternal Grandfather        Died at 71   Prostate cancer Maternal Grandfather    Heart attack Paternal Grandfather        Died at 29   Asthma Paternal Grandfather    Tuberculosis Paternal Grandfather    Cancer Cousin    Asperger's syndrome Cousin    Asperger's syndrome Other    Heart Problems Paternal Grandmother        Died at 29   Ehlers-Danlos syndrome Father    Ehlers-Danlos syndrome Brother    Social History:  reports that she has never smoked. She has never used smokeless tobacco. She reports that she does not drink alcohol and does  not use drugs.  Allergies:  Allergies  Allergen Reactions   Augmentin  [Amoxicillin-Pot Clavulanate] Diarrhea   Chocolate Flavor     Other reaction(s): Other (See Comments) Wheezing, acne   Demerol [Meperidine] Other (See Comments)    Slow to wake up when this drug is given.    Keflex [Cephalexin] Nausea And Vomiting   Morphine And Related Nausea And Vomiting   Tape Dermatitis    Must use paper tape only   Toradol [Ketorolac Tromethamine] Nausea And Vomiting   Amoxicillin     Other reaction(s): Unknown   Chocolate     GI distress   Nsaids     patient develops ulcers Other reaction(s): Other (See Comments) patient develops ulcers   Strawberry Extract     GI distress    (Not in a hospital admission)   No results found for this or any previous  visit (from the past 48 hour(s)). No results found.  Review of Systems  Constitutional: Negative.   HENT: Negative.    Eyes: Negative.   Respiratory: Negative.    Cardiovascular: Negative.   Gastrointestinal: Negative.   Musculoskeletal: Negative.   Skin: Negative.   Neurological: Negative.   Psychiatric/Behavioral: Negative.      Blood pressure 118/80, pulse 75, temperature 98.2 F (36.8 C), temperature source Temporal, resp. rate 18, SpO2 97 %. Physical Exam Vitals and nursing note reviewed.  Constitutional:      Appearance: She is well-developed.  HENT:     Head: Normocephalic and atraumatic.  Eyes:     Conjunctiva/sclera: Conjunctivae normal.     Pupils: Pupils are equal, round, and reactive to light.  Cardiovascular:     Heart sounds: Normal heart sounds.  Pulmonary:     Effort: Pulmonary effort is normal.  Abdominal:     Palpations: Abdomen is soft.  Musculoskeletal:        General: Normal range of motion.     Cervical back: Normal range of motion.  Skin:    General: Skin is warm and dry.  Neurological:     General: No focal deficit present.     Mental Status: She is alert.  Psychiatric:        Mood and Affect: Mood normal.        Thought Content: Thought content normal.        Judgment: Judgment normal.      Assessment/Plan Treatment today and we can follow-up in about 5 weeks  Alethia Berthold, MD 09/13/2022, 3:21 PM

## 2022-09-14 NOTE — Anesthesia Postprocedure Evaluation (Signed)
Anesthesia Post Note  Patient: Karen [name]  Procedure(s) Performed: ECT TX  Patient location during evaluation: PACU Anesthesia Type: General Level of consciousness: awake and alert Pain management: pain level controlled Vital Signs Assessment: post-procedure vital signs reviewed and stable Respiratory status: spontaneous breathing, nonlabored ventilation, respiratory function stable and patient connected to nasal cannula oxygen Cardiovascular status: blood pressure returned to baseline and stable Postop Assessment: no apparent nausea or vomiting Anesthetic complications: no   No notable events documented.   Last Vitals:  Vitals:   09/12/22 1320 09/12/22 1342  BP: 117/82 118/80  Pulse: 78 75  Resp: 18 18  Temp: 36.8 C 36.8 C  SpO2: 97%     Last Pain:  Vitals:   09/12/22 1342  TempSrc: Temporal  PainSc: 0-No pain                 Dimas Millin

## 2022-09-21 ENCOUNTER — Other Ambulatory Visit: Payer: Self-pay | Admitting: Gastroenterology

## 2022-09-21 DIAGNOSIS — R12 Heartburn: Secondary | ICD-10-CM

## 2022-09-22 ENCOUNTER — Other Ambulatory Visit: Payer: Self-pay | Admitting: Family Medicine

## 2022-09-22 ENCOUNTER — Ambulatory Visit (INDEPENDENT_AMBULATORY_CARE_PROVIDER_SITE_OTHER): Payer: Medicare Other | Admitting: Family

## 2022-09-22 ENCOUNTER — Encounter (INDEPENDENT_AMBULATORY_CARE_PROVIDER_SITE_OTHER): Payer: Self-pay | Admitting: Family

## 2022-09-22 VITALS — BP 118/80 | HR 84 | Ht 59.5 in | Wt 134.0 lb

## 2022-09-22 DIAGNOSIS — G2569 Other tics of organic origin: Secondary | ICD-10-CM

## 2022-09-22 DIAGNOSIS — Q796 Ehlers-Danlos syndrome, unspecified: Secondary | ICD-10-CM

## 2022-09-22 DIAGNOSIS — F333 Major depressive disorder, recurrent, severe with psychotic symptoms: Secondary | ICD-10-CM | POA: Diagnosis not present

## 2022-09-22 DIAGNOSIS — Z79899 Other long term (current) drug therapy: Secondary | ICD-10-CM | POA: Diagnosis not present

## 2022-09-22 DIAGNOSIS — G4710 Hypersomnia, unspecified: Secondary | ICD-10-CM | POA: Diagnosis not present

## 2022-09-22 DIAGNOSIS — M797 Fibromyalgia: Secondary | ICD-10-CM

## 2022-09-22 NOTE — Telephone Encounter (Signed)
Requested medication (s) are due for refill today: Due 10/01/22  Requested medication (s) are on the active medication list: yes    Last refill: 03/31/22  #90   1 refill  Future visit scheduled  Yes 10/07/22  Notes to clinic:Not delegated, please review. Labs due  Requested Prescriptions  Pending Prescriptions Disp Refills   QUEtiapine (SEROQUEL) 25 MG tablet [Pharmacy Med Name: QUETIAPINE FUMARATE 25 MG TAB] 90 tablet 1    Sig: TAKE 1 TABLET BY MOUTH EVERYDAY AT BEDTIME     Not Delegated - Psychiatry:  Antipsychotics - Second Generation (Atypical) - quetiapine Failed - 09/22/2022  1:25 AM      Failed - This refill cannot be delegated      Failed - Lipid Panel in normal range within the last 12 months    Cholesterol, Total  Date Value Ref Range Status  10/15/2019 189 100 - 199 mg/dL Final   LDL Chol Calc (NIH)  Date Value Ref Range Status  10/15/2019 108 (H) 0 - 99 mg/dL Final   HDL  Date Value Ref Range Status  10/15/2019 70 >39 mg/dL Final   Triglycerides  Date Value Ref Range Status  10/15/2019 57 0 - 149 mg/dL Final         Passed - TSH in normal range and within 360 days    TSH  Date Value Ref Range Status  08/02/2022 3.270 0.450 - 4.500 uIU/mL Final         Passed - Completed PHQ-2 or PHQ-9 in the last 360 days      Passed - Last BP in normal range    BP Readings from Last 1 Encounters:  07/11/22 127/64         Passed - Last Heart Rate in normal range    Pulse Readings from Last 1 Encounters:  07/11/22 75         Passed - Valid encounter within last 6 months    Recent Outpatient Visits           2 months ago Ehlers-Danlos syndrome   [place], Donald E, MD   4 months ago Tinea pedis of right foot   [facility] [place], [place], PA-C   4 months ago Need for influenza vaccination   [facility] Birdie Sons, MD   6 months ago Pain in toes of both feet   [place], Donald E, MD   8 months ago Chronic bilateral back pain, unspecified back location   [place], Kirstie Peri, MD       Future Appointments             In 2 weeks Caryn Section, Kirstie Peri, MD [facility]   In 2 months Ralene Bathe, MD [place]   In 9 months Ralene Bathe, MD [place] within normal limits and completed in the last 12 months    WBC  Date Value Ref Range Status  08/02/2022 5.6 3.4 - 10.8 x10E3/uL Final  07/10/2022 6.7 4.0 - 10.5 K/uL Final   RBC  Date Value Ref Range Status  08/02/2022 4.30 3.77 - 5.28 x10E6/uL Final  07/10/2022 4.84 3.87 - 5.11 MIL/uL Final   Hemoglobin  Date Value Ref Range Status  08/02/2022 12.0 11.1 -  15.9 g/dL Final   Hematocrit  Date Value Ref Range Status  08/02/2022 36.4 34.0 - 46.6 % Final   MCHC  Date Value Ref Range Status  08/02/2022 33.0 31.5 - 35.7 g/dL Final  07/10/2022 32.5 30.0 - 36.0 g/dL Final   [facility]  Date Value Ref Range Status  08/02/2022 27.9 26.6 - 33.0 pg Final  07/10/2022 27.9 26.0 - 34.0 pg Final   MCV  Date Value Ref Range Status  08/02/2022 85 79 - 97 fL Final  02/19/2014 92 80 - 100 fL Final   No results found for: "PLTCOUNTKUC", "LABPLAT", "POCPLA" RDW  Date Value Ref Range Status  08/02/2022 13.0 11.7 - 15.4 % Final  02/19/2014 13.8 11.5 - 14.5 % Final         Passed - CMP within normal limits and completed in the last 12 months    Albumin  Date Value Ref Range Status  08/02/2022 4.1 3.9 - 4.9 g/dL Final  02/19/2014 3.6 3.4 - 5.0 g/dL Final   Alkaline Phosphatase  Date Value Ref Range Status  08/02/2022 94 44 - 121 IU/L Final  02/19/2014 73 Unit/L Final    Comment:    45-117 NOTE: New Reference Range 07/05/13    ALT  Date Value Ref Range Status  08/02/2022 10 0 - 32 IU/L Final   SGPT (ALT)  Date Value  Ref Range Status  02/19/2014 27 12 - 78 U/L Final   AST  Date Value Ref Range Status  08/02/2022 15 0 - 40 IU/L Final   SGOT(AST)  Date Value Ref Range Status  02/19/2014 20 15 - 37 Unit/L Final   BUN  Date Value Ref Range Status  08/02/2022 16 6 - 24 mg/dL Final  02/19/2014 9 7 - 18 mg/dL Final   Calcium  Date Value Ref Range Status  08/02/2022 9.0 8.7 - 10.2 mg/dL Final   Calcium, Total  Date Value Ref Range Status  02/19/2014 9.0 8.5 - 10.1 mg/dL Final   CO2  Date Value Ref Range Status  08/02/2022 27 20 - 29 mmol/L Final   Co2  Date Value Ref Range Status  02/19/2014 29 21 - 32 mmol/L Final   Bicarbonate  Date Value Ref Range Status  10/28/2017 29.1 (H) 20.0 - 28.0 mmol/L Final   Creatinine  Date Value Ref Range Status  02/19/2014 0.82 0.60 - 1.30 mg/dL Final   Creatinine, Ser  Date Value Ref Range Status  08/02/2022 1.06 (H) 0.57 - 1.00 mg/dL Final   Glucose  Date Value Ref Range Status  08/02/2022 92 70 - 99 mg/dL Final  04/04/2014 102 mg/dL Final  02/19/2014 86 65 - 99 mg/dL Final   Glucose, Bld  Date Value Ref Range Status  07/10/2022 104 (H) 70 - 99 mg/dL Final    Comment:    Glucose reference range applies only to samples taken after fasting for at least 8 hours.   Glucose-Capillary  Date Value Ref Range Status  06/27/2022 121 (H) 70 - 99 mg/dL Final    Comment:    Glucose reference range applies only to samples taken after fasting for at least 8 hours.   Potassium  Date Value Ref Range Status  08/02/2022 4.6 3.5 - 5.2 mmol/L Final  02/19/2014 3.9 3.5 - 5.1 mmol/L Final   Sodium  Date Value Ref Range Status  08/02/2022 143 134 - 144 mmol/L Final  02/19/2014 140 136 - 145 mmol/L Final   Bilirubin,Total  Date Value Ref Range Status  02/19/2014 0.2 0.2 - 1.0 mg/dL Final   Bilirubin Total  Date Value Ref Range Status  08/02/2022 <0.2 0.0 - 1.2 mg/dL Final   Bilirubin, Direct  Date Value Ref Range Status  10/04/2021 0.1 0.0 - 0.2  mg/dL Final   Indirect Bilirubin  Date Value Ref Range Status  10/04/2021 0.5 0.3 - 0.9 mg/dL Final    Comment:    Performed at [facility], [address] [place], [place] [address]   Protein, ur  Date Value Ref Range Status  07/10/2022 NEGATIVE NEGATIVE mg/dL Final   Total Protein  Date Value Ref Range Status  08/02/2022 6.4 6.0 - 8.5 g/dL Final  02/19/2014 6.7 6.4 - 8.2 g/dL Final   EGFR (African American)  Date Value Ref Range Status  02/19/2014 >60  Final   GFR calc Af Amer  Date Value Ref Range Status  10/15/2019 80 >59 mL/min/1.73 Final   eGFR  Date Value Ref Range Status  08/02/2022 66 >59 mL/min/1.73 Final   EGFR (Non-African Amer.)  Date Value Ref Range Status  02/19/2014 >60  Final    Comment:    eGFR values <73m/min/1.73 m2 may be an indication of chronic kidney disease (CKD). Calculated eGFR is useful in patients with stable renal function. The eGFR calculation will not be reliable in acutely ill patients when serum creatinine is changing rapidly. It is not useful in  patients on dialysis. The eGFR calculation may not be applicable to patients at the low and high extremes of body sizes, pregnant women, and vegetarians.    GFR, Estimated  Date Value Ref Range Status  07/10/2022 >60 >60 mL/min Final    Comment:    (NOTE) Calculated using the CKD-EPI Creatinine Equation (2021)

## 2022-09-23 ENCOUNTER — Encounter (INDEPENDENT_AMBULATORY_CARE_PROVIDER_SITE_OTHER): Payer: Self-pay | Admitting: Family

## 2022-09-23 MED ORDER — HALOPERIDOL 0.5 MG PO TABS: ORAL_TABLET | ORAL | 5 refills | Status: AC

## 2022-09-23 NOTE — Patient Instructions (Signed)
It was a pleasure to see you today!  Instructions for you until your next appointment are as follows: Continue your medications as prescribed Continue close follow up with your psychiatrist Follow up with your pulmonologist about the daytime sleepiness.  Please sign up for MyChart if you have not done so. Please plan to return for follow up in 6 months or sooner if needed.   Feel free to contact our office during normal business hours at [phone] with questions or concerns. If there is no answer or the call is outside business hours, please leave a message and our clinic staff will call you back within the next business day.  If you have an urgent concern, please stay on the line for our after-hours answering service and ask for the on-call neurologist.     I also encourage you to use MyChart to communicate with me more directly. If you have not yet signed up for MyChart within [facility], the front desk staff can help you. However, please note that this inbox is NOT monitored on nights or weekends, and response can take up to 2 business days.  Urgent matters should be discussed with the on-call pediatric neurologist.   At [facility], we are committed to providing exceptional care. You will receive a patient satisfaction survey through text or email regarding your visit today. Your opinion is important to me. Comments are appreciated.

## 2022-09-23 NOTE — Progress Notes (Signed)
Karen [name]   MRN:  [id number]  12-Aug-1976   Provider: Rockwell Germany NP-C Location of Care: [facility]  Visit type: Return visit  Last visit: 04/04/2022  Referral source: Karen Sons, MD History from: Epic chart, patient and her mother  Brief history:  Copied from previous record: She has a chronic pain disorder related to Ehlers-Danlos syndrome with hypermobile joints.  She takes a variety of narcotic and nonnarcotic medications in order to control her pain.  These are prescribed by her primary physician, Dr. Caryn [name] of [facility] family practice.   In addition she has a chronic daily tension headache and has occasional migraines.  Gabapentin has been prescribed and has kept them in good control.   She is followed by pulmonologist Karen [name] for [facility] because of obstructive and central apnea.  She has a BiPAP machine. She has a delayed circadian rhythm going to bed between 2 and 3 in the morning and sleeping until noon time.   She has excessive daytime sleepiness and problems with focus.  This has been treated with Adderall by her pulmonologist.   As an adult she developed a motor tic disorder.  Currently this involves jerking movements of her legs and elevation of her arms to the elbows toward her shoulders.  She has some vocalizations that sound like a mixture of a sign of moaned that she has had for a number of years. She believes that when she takes excessive amounts of caffeine that that makes her tics worse.     She has bilateral eyelid ptosis.  On occasion particularly later in the day she has closure of her left eyelid and has difficulty opening it. She has nearsightedness.   In addition to these problems, Karen [name] also has a significant problem with mood. She has had recent admissions to the hospital for psychosis, depression and catatonia. Her mother notes that she is paranoid and wonders if she has schizophrenia  Today's  concerns: Reports tics have improved since last visit Receiving monthly ECT treatments Reports being sleepy during the day. Is able to awaken to do ADL's, then naps on and off during the day. She has history of OSA but has not been using the BiPaP for some time. Karen [name] has been otherwise generally healthy since she was last seen. No health concerns today other than previously mentioned.  Review of systems: Please see HPI for neurologic and other pertinent review of systems. Otherwise all other systems were reviewed and were negative.  Problem List: Patient Active Problem List   Diagnosis Date Noted   History of colonic polyps    History of closed head injury 11/09/2021   Thyroid nodule 10/05/2021   Catatonia 10/05/2021   Depression, major, recurrent, severe with psychosis ([place]) 10/05/2021   Polypharmacy 08/12/2021   Major depressive disorder, recurrent episode, moderate ([place]) 05/01/2021   Asthma 04/22/2021   Paranoia (psychosis) ([place]) 04/22/2021   Psychosis ([place]) 04/22/2021   Chronic tension-type headache, intractable 06/04/2020   Fibrocystic breast changes, bilateral 10/09/2019   Family history of colon cancer    Heartburn    Excessive somnolence disorder 06/28/2017   Difficulty hearing 08/31/2015   Back pain, chronic 08/31/2015   Rheumatoid arthritis ([place]) 08/31/2015   Alopecia 03/10/2015   Arthritis 03/10/2015   Allergic asthma, mild persistent, uncomplicated [id number]   Chronic nausea 03/10/2015   DDD (degenerative disc disease), lumbar 03/10/2015   Depression with anxiety 03/10/2015   Personal history of MRSA (methicillin resistant Staphylococcus aureus) 03/10/2015  Insomnia 03/10/2015   Mitral valve prolapse 03/10/2015   Paresthesias/numbness 03/10/2015   Allergic rhinitis, seasonal 03/10/2015   Seizure disorder ([place]) 03/10/2015   Mixed sleep apnea, Central predominant 03/10/2015   Migraine without aura and without status migrainosus, not intractable 01/27/2014    Fibromyalgia 01/27/2014   Tics of organic origin 01/07/2014   Ehlers-Danlos syndrome 01/07/2014     Past Medical History:  Diagnosis Date   Ehlers-Danlos syndrome    Fibromyalgia    Headache    Polyp of descending colon    Sleep apnea    Tics of organic origin    Transient alteration of awareness     Past medical history comments: See HPI Copied from previous record: She had 2 head injuries as an adolescent, one of which resulted in a post concussion condition that kept her out of school for a year.   She has a chronic pain syndrome from her Ehlers-Danlos and has been on a variety of medications including Celebrex, tizanidine, Lidoderm patches, fentanyl patches, hydrocodone, Lyrica, gabapentin, and Cymbalta.   She is also had problems with sinusitis, iron deficiency, and chronic lumbar pain.  Other medical problems include osteoarthritis, fibromyalgia, asthma.  She had seizure-like events in the past that were nonepileptic proven by EEGs capturing the behavior without electrographic seizure activity.  Behavior History Anxiety and depression  Surgical history: Past Surgical History:  Procedure Laterality Date   COLONOSCOPY WITH PROPOFOL N/A 02/13/2018   Procedure: COLONOSCOPY WITH PROPOFOL;  Surgeon: Karen Lame, MD;  Location: [facility] ENDOSCOPY;  Service: Endoscopy;  Laterality: N/A;   COLONOSCOPY WITH PROPOFOL N/A 01/11/2022   Procedure: COLONOSCOPY WITH PROPOFOL;  Surgeon: Karen Lame, MD;  Location: [facility] ENDOSCOPY;  Service: Endoscopy;  Laterality: N/A;   DILATION AND CURETTAGE OF UTERUS  08/16/2007   ESOPHAGOGASTRODUODENOSCOPY (EGD) WITH PROPOFOL  02/13/2018   Procedure: ESOPHAGOGASTRODUODENOSCOPY (EGD) WITH PROPOFOL;  Surgeon: Karen Lame, MD;  Location: [facility] ENDOSCOPY;  Service: Endoscopy;;   EYE SURGERY Left 08/15/1988   3 Surgeries on left eye to correct crossed eye   LAPAROSCOPY Left 08/15/2001   Fallopian Tube   MANDIBLE SURGERY  08/15/1996   OTHER SURGICAL HISTORY  Right 08/15/1985   3 Surgeries to repair broken arm   OTHER SURGICAL HISTORY Left    3 surgeries on her left thigh as an infant   OTHER SURGICAL HISTORY  08/15/1996   Jaw surgery   Right arm surgery  08/15/1985   x 3 due to fracture   TOENAIL EXCISION Bilateral 06/13/2019   Procedure: BILATERAL SECOND TOE PARTIAL NAIL ABLATION;  Surgeon: Erle Crocker, MD;  Location: Between;  Service: Orthopedics;  Laterality: Bilateral;  SURGERY REQUEST TIME 1 HOUR   TONSILLECTOMY  12/13/2002   TONSILLECTOMY       Family history: family history includes Asperger's syndrome in her brother, cousin, maternal aunt, maternal grandfather, and another family member; Asthma in her brother, maternal grandfather, and paternal grandfather; Breast cancer in her maternal aunt; Cancer in her cousin and maternal aunt; Cervical cancer in her maternal grandmother; Colon cancer in her maternal grandfather; Depression in her brother and mother; Ehlers-Danlos syndrome in her brother and father; Emphysema in her maternal grandfather; Fibromyalgia in her mother; Heart Problems in her paternal grandmother; Heart attack in her paternal grandfather; Heart disease in her paternal aunt and paternal uncle; Migraines in her brother; Osteoarthritis in her maternal grandmother and mother; Osteoporosis in her maternal grandmother; Other in her brother; Prostate cancer in her maternal grandfather; Stroke in  her mother; Tuberculosis in her paternal grandfather.   Social history: Social History   Socioeconomic History   Marital status: Single    Spouse name: Not on file   Number of children: 0   Years of education: Not on file   Highest education level: Bachelor's degree (e.g., BA, AB, BS)  Occupational History   Not on file  Tobacco Use   Smoking status: Never   Smokeless tobacco: Never  Vaping Use   Vaping Use: Never used  Substance and Sexual Activity   Alcohol use: No    Alcohol/week: 0.0 standard  drinks of alcohol   Drug use: No   Sexual activity: Never  Other Topics Concern   Not on file  Social History Narrative   Kliyah is 89.   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    She lives with her mother.    She enjoys reading, watching TV, writing, and painting.    Social Determinants of Health   Financial Resource Strain: Low Risk  (12/01/2021)   Overall Financial Resource Strain (CARDIA)    Difficulty of Paying Living Expenses: Not very hard  Food Insecurity: No Food Insecurity (12/01/2021)   Hunger Vital Sign    Worried About Running Out of Food in the Last Year: Never true    Ran Out of Food in the Last Year: Never true  Transportation Needs: No Transportation Needs (12/01/2021)   PRAPARE - Hydrologist (Medical): No    Lack of Transportation (Non-Medical): No  Physical Activity: Insufficiently Active (12/01/2021)   Exercise Vital Sign    Days of Exercise per Week: 2 days    Minutes of Exercise per Session: 30 min  Stress: No Stress Concern Present (12/01/2021)   [place]    Feeling of Stress : Not at all  Social Connections: Socially Isolated (12/01/2021)   Social Connection and Isolation Panel [NHANES]    Frequency of Communication with Friends and Family: Twice a week    Frequency of Social Gatherings with Friends and Family: Once a week    Attends Religious Services: Never    Marine scientist or Organizations: No    Attends Archivist Meetings: Never    Marital Status: Never married  Intimate Partner Violence: Not At Risk (12/01/2021)   Humiliation, Afraid, Rape, and Kick questionnaire    Fear of Current or Ex-Partner: No    Emotionally Abused: No    Physically Abused: No    Sexually Abused: No    Past/failed meds:  Allergies: Allergies  Allergen Reactions   Augmentin  [Amoxicillin-Pot Clavulanate] Diarrhea   Chocolate Flavor     Other reaction(s): Other  (See Comments) Wheezing, acne   Demerol [Meperidine] Other (See Comments)    Slow to wake up when this drug is given.    Keflex [Cephalexin] Nausea And Vomiting   Morphine And Related Nausea And Vomiting   Tape Dermatitis    Must use paper tape only   Toradol [Ketorolac Tromethamine] Nausea And Vomiting   Amoxicillin     Other reaction(s): Unknown   Chocolate     GI distress   Nsaids     patient develops ulcers Other reaction(s): Other (See Comments) patient develops ulcers   Strawberry Extract     GI distress    Immunizations: Immunization History  Administered Date(s) Administered   Covid-19, Mrna,Vaccine(Spikevax)16yr and older 05/19/2022   H1N1 06/03/2008   HPV 9-valent 07/19/2018  Influenza Split 06/14/2012   Influenza,inj,Quad PF,6+ Mos 10/01/2018, 05/26/2020, 07/19/2021, 05/10/2022   Influenza-Unspecified 05/24/2017, 05/16/2019   Moderna Covid-19 Vaccine Bivalent Booster 48yr & up 07/19/2021   PFIZER(Purple Top)SARS-COV-2 Vaccination 11/05/2019, 11/26/2019, 05/26/2020   Pneumococcal Polysaccharide-23 05/24/2017   Tdap 06/14/2012    Diagnostics/Screenings:  Physical Exam: BP 118/80 (BP Location: Left Arm, Patient Position: Sitting, Cuff Size: Normal)   Pulse 84   Ht 4' 11.5" (1.511 m)   Wt 134 lb (60.8 kg)   BMI 26.61 kg/m   General: well developed, well nourished woman, seated in wheelchair, in no evident distress Head: normocephalic and atraumatic. Oropharynx benign. No dysmorphic features. Neck: supple Cardiovascular: regular rate and rhythm, no murmurs. Respiratory: clear to auscultation bilaterally Abdomen: bowel sounds present all four quadrants, abdomen soft, non-tender, non-distended. No hepatosplenomegaly or masses palpated. Musculoskeletal: no skeletal deformities or obvious scoliosis.  Skin: no rashes or neurocutaneous lesions  Neurologic Exam Mental Status: awake but looks sleepy and yawns frequently. Oriented to time and place. Speech fluent.  Mood and affect flat.  Cranial Nerves: fundoscopic exam - red reflex present.  Unable to fully visualize fundus.  Pupils equal briskly reactive to light.  Turns to localize faces and objects in the periphery. Turns to localize sounds in the periphery. Facial movements are symmetric. Motor: diffuse weakness Sensory: withdrawal x 4 Coordination: finger to nose performed accurately Gait and Station: unable to independently stand and bear weight. I did not get her out of her wheelchair  Impression: Tics of organic origin - Plan: haloperidol (HALDOL) 0.5 MG tablet  Ehlers-Danlos syndrome  Polypharmacy  Fibromyalgia  Depression, major, recurrent, severe with psychosis ([place]  Excessive somnolence disorder   Recommendations for plan of care: The patient's previous Epic records were reviewed. No recent diagnostic studies to be reviewed with the patient.  Plan until next visit: Continue medications as prescribed  Continue close follow up with psychiatrist Follow up with pulmonology about daytime sleepiness and history of OSA Return in about 6 months (around 03/23/2023).  The medication list was reviewed and reconciled. No changes were made in the prescribed medications today. A complete medication list was provided to the patient.  Allergies as of 09/22/2022       Reactions   Augmentin  [amoxicillin-pot Clavulanate] Diarrhea   Chocolate Flavor    Other reaction(s): Other (See Comments) Wheezing, acne   Demerol [meperidine] Other (See Comments)   Slow to wake up when this drug is given.    Keflex [cephalexin] Nausea And Vomiting   Morphine And Related Nausea And Vomiting   Tape Dermatitis   Must use paper tape only   Toradol [ketorolac Tromethamine] Nausea And Vomiting   Amoxicillin    Other reaction(s): Unknown   Chocolate    GI distress   Nsaids    patient develops ulcers Other reaction(s): Other (See Comments) patient develops ulcers   Strawberry Extract    GI distress         Medication List        Accurate as of September 22, 2022 11:59 PM. If you have any questions, ask your nurse or doctor.          STOP taking these medications    fluticasone 50 MCG/ACT nasal spray Commonly known as: FLONASE Stopped by: TRockwell Germany NP   Fluticasone-Salmeterol 250-50 MCG/DOSE Aepb Commonly known as: ADVAIR Stopped by: TRockwell Germany NP   Na Sulfate-K Sulfate-Mg Sulf 17.5-3.13-1.6 GM/177ML Soln Stopped by: TRockwell Germany NP   tiZANidine 4 MG tablet Commonly  known as: ZANAFLEX Stopped by: Karen Germany, NP   trimethoprim-polymyxin b ophthalmic solution Commonly known as: POLYTRIM Stopped by: Karen Germany, NP       TAKE these medications    albuterol 108 (90 Base) MCG/ACT inhaler Commonly known as: Ventolin HFA Inhale 2 puffs into the lungs every 6 (six) hours as needed.   busPIRone 15 MG tablet Commonly known as: BUSPAR TAKE 1 TABLET BY MOUTH TWICE A DAY   busPIRone 10 MG tablet Commonly known as: BUSPAR TAKE 1 TABLET (10 MG TOTAL) BY MOUTH 2 (TWO) TIMES DAILY. TAKE ALONG WITH 15MG TABLET TWICE A DAY   celecoxib 200 MG capsule Commonly known as: CELEBREX TAKE 1 CAPSULE BY MOUTH TWICE A DAY   Ciclopirox 8 % Kit Apply 1 application  topically daily.   diclofenac Sodium 1 % Gel Commonly known as: VOLTAREN APPLY AS NEEDED 3 TIMES A DAY   DULoxetine 60 MG capsule Commonly known as: CYMBALTA Take 2 capsules (120 mg total) by mouth daily.   fentaNYL 75 MCG/HR Commonly known as: [place] 1 patch onto the skin every 3 (three) days.   fexofenadine 180 MG tablet Commonly known as: ALLEGRA Take 180 mg by mouth daily.   gabapentin 800 MG tablet Commonly known as: NEURONTIN TAKE 1 TABLET BY MOUTH THREE TIMES A DAY   haloperidol 0.5 MG tablet Commonly known as: HALDOL TAKE 1/2 OF A TABLET (0.25 MG TOTAL) BY MOUTH TWICE A DAY   lurasidone 80 MG Tabs tablet Commonly known as: Latuda Take 1 tablet (80 mg total)  by mouth daily with supper.   naloxone 4 MG/0.1ML Liqd nasal spray kit Commonly known as: NARCAN Place 1 spray into the nose as needed.   omeprazole 40 MG capsule Commonly known as: PRILOSEC TAKE 1 CAPSULE (40 MG TOTAL) BY MOUTH 2 (TWO) TIMES DAILY. SCHEDULE OFFICE VISIT   ondansetron 4 MG disintegrating tablet Commonly known as: ZOFRAN-ODT DISSOLVE 1 TABLET IN MOUTH EVERY 8 HOURS FOR NAUSEA AND VOMITING   QUEtiapine 25 MG tablet Commonly known as: SEROQUEL Take 1 tablet (25 mg total) by mouth at bedtime.       Total time spent with the patient was 25 minutes, of which 50% or more was spent in counseling and coordination of care.  Karen Germany NP-C [facility] [id number] [place], [place] [address] Ph. [phone] Fax [phone]

## 2022-10-04 ENCOUNTER — Other Ambulatory Visit: Payer: Self-pay | Admitting: Family Medicine

## 2022-10-04 DIAGNOSIS — G8929 Other chronic pain: Secondary | ICD-10-CM

## 2022-10-04 NOTE — Telephone Encounter (Unsigned)
Medication Refill - Medication: fentaNYL ([place]) 75 MCG/HR [id number]    Has the patient contacted their pharmacy? No. (Agent: If no, request that the patient contact the pharmacy for the refill. If patient does not wish to contact the pharmacy document the reason why and proceed with request.) (Agent: If yes, when and what did the pharmacy advise?)  Preferred Pharmacy (with phone number or street name): [name] Has the patient been seen for an appointment in the last year OR does the patient have an upcoming appointment? Yes.    Agent: Please be advised that RX refills may take up to 3 business days. We ask that you follow-up with your pharmacy.

## 2022-10-05 MED ORDER — FENTANYL 75 MCG/HR TD PT72: 1.0000 | MEDICATED_PATCH | TRANSDERMAL | 0 refills | Status: DC

## 2022-10-05 NOTE — Telephone Encounter (Signed)
Requested medication (s) are due for refill today - yes  Requested medication (s) are on the active medication list -yes  Future visit scheduled -yes  Last refill: 09/03/22 #10  Notes to clinic: non delegated Rx  Requested Prescriptions  Pending Prescriptions Disp Refills   fentaNYL (DURAGESIC) 75 MCG/HR 10 patch 0    Sig: Place 1 patch onto the skin every 3 (three) days.     Not Delegated - Analgesics:  Opioid Agonists Failed - 10/04/2022  1:02 PM      Failed - This refill cannot be delegated      Failed - Urine Drug Screen completed in last 360 days      Failed - Valid encounter within last 3 months    Recent Outpatient Visits           3 months ago Ehlers-Danlos syndrome   [place], Donald E, MD   4 months ago Tinea pedis of right foot   [place], [place], PA-C   4 months ago Need for influenza vaccination   [facility] Birdie Sons, MD   6 months ago Pain in toes of both feet   [place], Donald E, MD   9 months ago Chronic bilateral back pain, unspecified back location   [place], Kirstie Peri, MD       Future Appointments             In 2 days Caryn Section, Kirstie Peri, MD [facility]   In 2 months Ralene Bathe, MD [place]   In 8 months Ralene Bathe, MD [place]               Requested Prescriptions  Pending Prescriptions Disp Refills   fentaNYL (DURAGESIC) 75 MCG/HR 10 patch 0    Sig: Place 1 patch onto the skin every 3 (three) days.     Not Delegated - Analgesics:  Opioid Agonists Failed - 10/04/2022  1:02 PM      Failed - This refill cannot be delegated      Failed - Urine Drug Screen completed in last 360 days      Failed - Valid encounter within last 3 months    Recent Outpatient Visits            3 months ago Ehlers-Danlos syndrome   [place], Donald E, MD   4 months ago Tinea pedis of right foot   [place], [place], PA-C   4 months ago Need for influenza vaccination   [facility] Birdie Sons, MD   6 months ago Pain in toes of both feet   [place], Donald E, MD   9 months ago Chronic bilateral back pain, unspecified back location   [place], Kirstie Peri, MD       Future Appointments             In 2 days [place], Kirstie Peri, MD [facility], [place]   In 2 months Ralene Bathe, MD [place]   In 8 months Ralene Bathe, MD [place]

## 2022-10-06 NOTE — Progress Notes (Deleted)
   Argentina Ponder Hanan Moen,acting as a scribe for Lelon Huh, MD.,have documented all relevant documentation on the behalf of Lelon Huh, MD,as directed by  Lelon Huh, MD while in the presence of Lelon Huh, MD.     Established patient visit   Patient: Karen [name]   DOB: 05-Jun-1976   47 y.o. Female  MRN: [id number] Visit Date: 10/07/2022  Today's healthcare provider: Lelon Huh, MD   No chief complaint on file.  Subjective    HPI  Pain Management, Follow-up  Patient is a 47 year old female who presents for follow up of chronic pain.    ---------------------------------------------------------------------------------------------------   Medications: Outpatient Medications Prior to Visit  Medication Sig   albuterol (VENTOLIN HFA) 108 (90 Base) MCG/ACT inhaler Inhale 2 puffs into the lungs every 6 (six) hours as needed. (Patient not taking: Reported on 09/22/2022)   busPIRone (BUSPAR) 10 MG tablet TAKE 1 TABLET (10 MG TOTAL) BY MOUTH 2 (TWO) TIMES DAILY. TAKE ALONG WITH 15MG TABLET TWICE A DAY   busPIRone (BUSPAR) 15 MG tablet TAKE 1 TABLET BY MOUTH TWICE A DAY   celecoxib (CELEBREX) 200 MG capsule TAKE 1 CAPSULE BY MOUTH TWICE A DAY   Ciclopirox 8 % KIT Apply 1 application  topically daily.   diclofenac Sodium (VOLTAREN) 1 % GEL APPLY AS NEEDED 3 TIMES A DAY   DULoxetine (CYMBALTA) 60 MG capsule Take 2 capsules (120 mg total) by mouth daily.   fentaNYL (DURAGESIC) 75 MCG/HR Place 1 patch onto the skin every 3 (three) days.   fexofenadine (ALLEGRA) 180 MG tablet Take 180 mg by mouth daily.   gabapentin (NEURONTIN) 800 MG tablet TAKE 1 TABLET BY MOUTH THREE TIMES A DAY   haloperidol (HALDOL) 0.5 MG tablet TAKE 1/2 OF A TABLET (0.25 MG TOTAL) BY MOUTH TWICE A DAY   lurasidone (LATUDA) 80 MG TABS tablet Take 1 tablet (80 mg total) by mouth daily with supper.   naloxone (NARCAN) nasal spray 4 mg/0.1 mL Place 1 spray into the nose as needed.   omeprazole (PRILOSEC) 40 MG  capsule TAKE 1 CAPSULE (40 MG TOTAL) BY MOUTH 2 (TWO) TIMES DAILY. SCHEDULE OFFICE VISIT   ondansetron (ZOFRAN-ODT) 4 MG disintegrating tablet DISSOLVE 1 TABLET IN MOUTH EVERY 8 HOURS FOR NAUSEA AND VOMITING   QUEtiapine (SEROQUEL) 25 MG tablet TAKE 1 TABLET BY MOUTH EVERYDAY AT BEDTIME   No facility-administered medications prior to visit.    Review of Systems  {Labs  Heme  Chem  Endocrine  Serology  Results Review (optional):23779}   Objective    There were no vitals taken for this visit. {Show previous vital signs (optional):23777}  Physical Exam  ***  No results found for any visits on 10/07/22.  Assessment & Plan     ***  No follow-ups on file.      {provider attestation***:1}   Lelon Huh, MD  Time [phone] (phone) [phone] (fax)  [place]

## 2022-10-07 ENCOUNTER — Ambulatory Visit: Payer: Medicare Other | Admitting: Family Medicine

## 2022-10-19 ENCOUNTER — Encounter
Admission: RE | Admit: 2022-10-19 | Discharge: 2022-10-19 | Disposition: A | Discharge status: Home / Self Care | Payer: Medicare Other | Source: Ambulatory Visit | Attending: Family Medicine | Admitting: Family Medicine

## 2022-10-19 ENCOUNTER — Ambulatory Visit: Payer: Self-pay | Admitting: Anesthesiology

## 2022-10-19 ENCOUNTER — Encounter: Payer: Self-pay | Admitting: Anesthesiology

## 2022-10-19 ENCOUNTER — Other Ambulatory Visit: Payer: Self-pay | Admitting: Psychiatry

## 2022-10-19 DIAGNOSIS — F333 Major depressive disorder, recurrent, severe with psychotic symptoms: Secondary | ICD-10-CM | POA: Diagnosis not present

## 2022-10-19 DIAGNOSIS — R200 Anesthesia of skin: Secondary | ICD-10-CM

## 2022-10-19 DIAGNOSIS — R531 Weakness: Secondary | ICD-10-CM | POA: Diagnosis not present

## 2022-10-19 DIAGNOSIS — F339 Major depressive disorder, recurrent, unspecified: Secondary | ICD-10-CM | POA: Diagnosis not present

## 2022-10-19 DIAGNOSIS — F418 Other specified anxiety disorders: Secondary | ICD-10-CM | POA: Diagnosis not present

## 2022-10-19 LAB — URINALYSIS, COMPLETE (UACMP) WITH MICROSCOPIC
Bilirubin Urine: NEGATIVE
Glucose, UA: NEGATIVE mg/dL
Hgb urine dipstick: NEGATIVE
Ketones, ur: NEGATIVE mg/dL
Nitrite: NEGATIVE
Protein, ur: NEGATIVE mg/dL
Specific Gravity, Urine: 1.015 (ref 1.005–1.030)
WBC, UA: >50 WBC/hpf (ref 0–5)
pH: 6 (ref 5.0–8.0)

## 2022-10-19 MED ORDER — MIDAZOLAM HCL 2 MG/2ML IJ SOLN
INTRAMUSCULAR | Status: AC
  Filled 2022-10-19: qty 2

## 2022-10-19 MED ORDER — ONDANSETRON HCL 4 MG/2ML IJ SOLN: 4.0000 mg | Freq: Once | INTRAMUSCULAR | Status: DC

## 2022-10-19 MED ORDER — PROPOFOL 10 MG/ML IV BOLUS
INTRAVENOUS | Status: DC | PRN
  Administered 2022-10-19: 40 mg via INTRAVENOUS

## 2022-10-19 MED ORDER — SODIUM CHLORIDE 0.9 % IV SOLN: INTRAVENOUS | Status: DC | PRN

## 2022-10-19 MED ORDER — SODIUM CHLORIDE 0.9 % IV SOLN: 500.0000 mL | Freq: Once | INTRAVENOUS | Status: DC

## 2022-10-19 MED ORDER — KETAMINE HCL 10 MG/ML IJ SOLN
INTRAMUSCULAR | Status: DC | PRN
  Administered 2022-10-19: 40 mg via INTRAVENOUS

## 2022-10-19 MED ORDER — MIDAZOLAM HCL 2 MG/2ML IJ SOLN
INTRAMUSCULAR | Status: DC | PRN
  Administered 2022-10-19: 2 mg via INTRAVENOUS

## 2022-10-19 MED ORDER — MIDAZOLAM HCL 2 MG/2ML IJ SOLN: 2.0000 mg | Freq: Once | INTRAMUSCULAR | Status: DC

## 2022-10-19 MED ORDER — KETAMINE HCL 50 MG/5ML IJ SOSY
PREFILLED_SYRINGE | INTRAMUSCULAR | Status: AC
  Filled 2022-10-19: qty 5

## 2022-10-19 MED ORDER — FENTANYL CITRATE (PF) 100 MCG/2ML IJ SOLN: 25.0000 ug | INTRAMUSCULAR | Status: DC | PRN

## 2022-10-19 MED ORDER — SUCCINYLCHOLINE CHLORIDE 200 MG/10ML IV SOSY
PREFILLED_SYRINGE | INTRAVENOUS | Status: DC | PRN
  Administered 2022-10-19: 60 mg via INTRAVENOUS

## 2022-10-19 MED ORDER — ONDANSETRON HCL 4 MG/2ML IJ SOLN: 4.0000 mg | Freq: Once | INTRAMUSCULAR | Status: DC | PRN

## 2022-10-19 MED ORDER — LABETALOL HCL 5 MG/ML IV SOLN
INTRAVENOUS | Status: DC | PRN
  Administered 2022-10-19: 5 mg via INTRAVENOUS

## 2022-10-19 NOTE — Progress Notes (Signed)
Alert/awake oriented x4.  PIV removed left hand without adverse reaction, tolerated.  Tolerated sips of water without event

## 2022-10-19 NOTE — Transfer of Care (Signed)
Immediate Anesthesia Transfer of Care Note  Patient: Karen [name]  Procedure(s) Performed: ECT TX  Patient Location: PACU  Anesthesia Type:MAC  Level of Consciousness: drowsy  Airway & Oxygen Therapy: Patient Spontanous Breathing and Patient connected to face mask oxygen  Post-op Assessment: Report given to RN and Post -op Vital signs reviewed and stable  Post vital signs: Reviewed  Last Vitals:  Vitals Value Taken Time  BP    Temp    Pulse 76 10/19/22 1333  Resp 15 10/19/22 1333  SpO2 98 % 10/19/22 1333  Vitals shown include unvalidated device data.  Last Pain:  Vitals:   10/19/22 1044  TempSrc:   PainSc: 0-No pain         Complications: No notable events documented.

## 2022-10-19 NOTE — Anesthesia Preprocedure Evaluation (Signed)
Anesthesia Evaluation  Patient identified by MRN, date of birth, ID band Patient awake    Reviewed: Allergy & Precautions, NPO status , Patient's Chart, lab work & pertinent test results  Airway Mallampati: II  TM Distance: >3 FB Neck ROM: full    Dental  (+) Poor Dentition   Pulmonary neg pulmonary ROS, asthma    Pulmonary exam normal breath sounds clear to auscultation       Cardiovascular Exercise Tolerance: Good negative cardio ROS Normal cardiovascular exam Rhythm:Regular     Neuro/Psych  Headaches, Seizures -,   Anxiety Depression    negative neurological ROS  negative psych ROS   GI/Hepatic negative GI ROS, Neg liver ROS,,,  Endo/Other  negative endocrine ROS    Renal/GU negative Renal ROS  negative genitourinary   Musculoskeletal   Abdominal Normal abdominal exam  (+)   Peds negative pediatric ROS (+)  Hematology negative hematology ROS (+)   Anesthesia Other Findings Past Medical History: No date: Ehlers-Danlos syndrome No date: Fibromyalgia No date: Headache No date: Polyp of descending colon No date: Sleep apnea No date: Tics of organic origin No date: Transient alteration of awareness  Past Surgical History: 02/13/2018: COLONOSCOPY WITH PROPOFOL; N/A     Comment:  Procedure: COLONOSCOPY WITH PROPOFOL;  Surgeon: [name], MD;  Location: [facility] ENDOSCOPY;  Service:               Endoscopy;  Laterality: N/A; 01/11/2022: COLONOSCOPY WITH PROPOFOL; N/A     Comment:  Procedure: COLONOSCOPY WITH PROPOFOL;  Surgeon: [name], MD;  Location: [facility] ENDOSCOPY;  Service:               Endoscopy;  Laterality: N/A; 08/16/2007: DILATION AND CURETTAGE OF UTERUS 02/13/2018: ESOPHAGOGASTRODUODENOSCOPY (EGD) WITH PROPOFOL     Comment:  Procedure: ESOPHAGOGASTRODUODENOSCOPY (EGD) WITH               PROPOFOL;  Surgeon: [name], MD;  Location: [facility]               ENDOSCOPY;   Service: Endoscopy;; 08/15/1988: EYE SURGERY; Left     Comment:  3 Surgeries on left eye to correct crossed eye 08/15/2001: LAPAROSCOPY; Left     Comment:  Fallopian Tube 08/15/1996: MANDIBLE SURGERY 08/15/1985: OTHER SURGICAL HISTORY; Right     Comment:  3 Surgeries to repair broken arm No date: OTHER SURGICAL HISTORY; Left     Comment:  3 surgeries on her left thigh as an infant 08/15/1996: OTHER SURGICAL HISTORY     Comment:  Jaw surgery 08/15/1985: Right arm surgery     Comment:  x 3 due to fracture 06/13/2019: TOENAIL EXCISION; Bilateral     Comment:  Procedure: BILATERAL SECOND TOE PARTIAL NAIL ABLATION;                Surgeon: Erle Crocker, MD;  Location: [place];  Service: Orthopedics;  Laterality:               Bilateral;  SURGERY REQUEST TIME 1 HOUR 12/13/2002: TONSILLECTOMY No date: TONSILLECTOMY  BMI    Body Mass Index: 27.06 kg/m      Reproductive/Obstetrics negative OB ROS  Anesthesia Physical Anesthesia Plan  ASA: 3  Anesthesia Plan: General   Post-op Pain Management:    Induction: Intravenous  PONV Risk Score and Plan: Propofol infusion and TIVA  Airway Management Planned: Natural Airway and Mask  Additional Equipment:   Intra-op Plan:   Post-operative Plan:   Informed Consent: I have reviewed the patients History and Physical, chart, labs and discussed the procedure including the risks, benefits and alternatives for the proposed anesthesia with the patient or authorized representative who has indicated his/her understanding and acceptance.     Dental Advisory Given  Plan Discussed with: CRNA and Surgeon  Anesthesia Plan Comments:        Anesthesia Quick Evaluation

## 2022-10-19 NOTE — H&P (Signed)
Karen [name] is an 47 y.o. female.   Chief Complaint: mood down. Feeling weak HPI: recurrent depression  Past Medical History:  Diagnosis Date   Ehlers-Danlos syndrome    Fibromyalgia    Headache    Polyp of descending colon    Sleep apnea    Tics of organic origin    Transient alteration of awareness     Past Surgical History:  Procedure Laterality Date   COLONOSCOPY WITH PROPOFOL N/A 02/13/2018   Procedure: COLONOSCOPY WITH PROPOFOL;  Surgeon: [name], MD;  Location: [facility] ENDOSCOPY;  Service: Endoscopy;  Laterality: N/A;   COLONOSCOPY WITH PROPOFOL N/A 01/11/2022   Procedure: COLONOSCOPY WITH PROPOFOL;  Surgeon: [name], MD;  Location: [facility] ENDOSCOPY;  Service: Endoscopy;  Laterality: N/A;   DILATION AND CURETTAGE OF UTERUS  08/16/2007   ESOPHAGOGASTRODUODENOSCOPY (EGD) WITH PROPOFOL  02/13/2018   Procedure: ESOPHAGOGASTRODUODENOSCOPY (EGD) WITH PROPOFOL;  Surgeon: [name], MD;  Location: [place] ENDOSCOPY;  Service: Endoscopy;;   EYE SURGERY Left 08/15/1988   3 Surgeries on left eye to correct crossed eye   LAPAROSCOPY Left 08/15/2001   Fallopian Tube   MANDIBLE SURGERY  08/15/1996   OTHER SURGICAL HISTORY Right 08/15/1985   3 Surgeries to repair broken arm   OTHER SURGICAL HISTORY Left    3 surgeries on her left thigh as an infant   OTHER SURGICAL HISTORY  08/15/1996   Jaw surgery   Right arm surgery  08/15/1985   x 3 due to fracture   TOENAIL EXCISION Bilateral 06/13/2019   Procedure: BILATERAL SECOND TOE PARTIAL NAIL ABLATION;  Surgeon: Erle Crocker, MD;  Location: [place];  Service: Orthopedics;  Laterality: Bilateral;  SURGERY REQUEST TIME 1 HOUR   TONSILLECTOMY  12/13/2002   TONSILLECTOMY      Family History  Problem Relation Age of Onset   Depression Mother    Stroke Mother    Fibromyalgia Mother    Osteoarthritis Mother    Asthma Brother    Depression Brother    Migraines Brother    Asperger's syndrome Brother     Other Brother        Ehlers-Danlos Syndrome   Cancer Maternal Aunt    Asperger's syndrome Maternal Aunt    Breast cancer Maternal Aunt    Heart disease Paternal Aunt    Heart disease Paternal Uncle    Cervical cancer Maternal Grandmother    Osteoporosis Maternal Grandmother        Died at 79   Osteoarthritis Maternal Grandmother    Colon cancer Maternal Grandfather    Asthma Maternal Grandfather    Asperger's syndrome Maternal Grandfather    Emphysema Maternal Grandfather        Died at 28   Prostate cancer Maternal Grandfather    Heart attack Paternal Grandfather        Died at 55   Asthma Paternal Grandfather    Tuberculosis Paternal Grandfather    Cancer Cousin    Asperger's syndrome Cousin    Asperger's syndrome Other    Heart Problems Paternal Grandmother        Died at 25   Ehlers-Danlos syndrome Father    Ehlers-Danlos syndrome Brother    Social History:  reports that she has never smoked. She has never used smokeless tobacco. She reports that she does not drink alcohol and does not use drugs.  Allergies:  Allergies  Allergen Reactions   Augmentin  [Amoxicillin-Pot Clavulanate] Diarrhea   Chocolate Flavor  Other reaction(s): Other (See Comments) Wheezing, acne   Demerol [Meperidine] Other (See Comments)    Slow to wake up when this drug is given.    Keflex [Cephalexin] Nausea And Vomiting   Morphine And Related Nausea And Vomiting   Tape Dermatitis    Must use paper tape only   Toradol [Ketorolac Tromethamine] Nausea And Vomiting   Amoxicillin     Other reaction(s): Unknown   Chocolate     GI distress   Nsaids     patient develops ulcers Other reaction(s): Other (See Comments) patient develops ulcers   Strawberry Extract     GI distress    (Not in a hospital admission)   No results found for this or any previous visit (from the past 48 hour(s)). No results found.  Review of Systems  Constitutional:  Positive for fatigue.  HENT: Negative.     Eyes: Negative.   Respiratory: Negative.    Cardiovascular: Negative.   Gastrointestinal: Negative.   Musculoskeletal: Negative.   Skin: Negative.   Neurological: Negative.   Psychiatric/Behavioral:  Positive for dysphoric mood. Negative for agitation, behavioral problems, hallucinations, self-injury and suicidal ideas.     Blood pressure 118/81, pulse 79, temperature 98.2 F (36.8 C), temperature source Oral, resp. rate 18, height '4\' 11"'$  (1.499 m), SpO2 97 %. Physical Exam Vitals and nursing note reviewed.  Constitutional:      Appearance: She is well-developed.  HENT:     Head: Normocephalic and atraumatic.  Eyes:     Conjunctiva/sclera: Conjunctivae normal.     Pupils: Pupils are equal, round, and reactive to light.  Cardiovascular:     Heart sounds: Normal heart sounds.  Pulmonary:     Effort: Pulmonary effort is normal.  Abdominal:     Palpations: Abdomen is soft.  Musculoskeletal:        General: Normal range of motion.     Cervical back: Normal range of motion.  Skin:    General: Skin is warm and dry.  Neurological:     Mental Status: She is alert.     Comments: Chronic weekness  Psychiatric:        Attention and Perception: Attention normal.        Mood and Affect: Affect is blunt.        Speech: Speech normal.        Behavior: Behavior normal.        Cognition and Memory: Cognition normal.      Assessment/Plan Return in 4 weeks. Also check UA today  Alethia Berthold, MD 10/19/2022, 12:11 PM

## 2022-10-19 NOTE — Anesthesia Postprocedure Evaluation (Signed)
Anesthesia Post Note  Patient: Karen [name]  Procedure(s) Performed: ECT TX  Patient location during evaluation: PACU Anesthesia Type: General Level of consciousness: awake Pain management: satisfactory to patient Vital Signs Assessment: post-procedure vital signs reviewed and stable Respiratory status: spontaneous breathing and respiratory function stable Cardiovascular status: stable Anesthetic complications: no   No notable events documented.   Last Vitals:  Vitals:   10/19/22 1334 10/19/22 1340  BP: 118/74 (!) 125/93  Pulse: 77 88  Resp: 12 20  Temp:    SpO2: 99% 100%    Last Pain:  Vitals:   10/19/22 1340  TempSrc:   PainSc: 0-No pain                 VAN STAVEREN,Analleli Gierke

## 2022-10-19 NOTE — Procedures (Signed)
ECT SERVICES Physician's Interval Evaluation & Treatment Note  Patient Identification: Karen [name] MRN:  [id number] Date of Evaluation:  10/19/2022 TX #: [id number]  MADRS:   MMSE:   P.E. Findings:  No change to physical exam  Psychiatric Interval Note:  Mood is a little bit down and depressed.  She also complains that she feels like she might be having a urinary tract infection  Subjective:  Patient is a 47 y.o. female seen for evaluation for Electroconvulsive Therapy. A little more tired and having some UTI symptoms.  Denies hallucinations  Treatment Summary:   '[]'$   Right Unilateral             '[x]'$  Bilateral   % Energy : 1.0 ms 40%   Impedance: 1350 ohms  Seizure Energy Index: 20,096 V squared  Postictal Suppression Index: 60%  Seizure Concordance Index: 88%  Medications  Pre Shock: Zofran 4 mg labetalol 20 mg ketamine 40 mg succinylcholine 60 mg propofol 20 mL grams  Post Shock: Versed 2 mg  Seizure Duration: EMG 29 seconds EEG 47 seconds   Comments: Checking urinalysis.  Next treatment recommended in 4 weeks  Lungs:  '[x]'$   Clear to auscultation               '[]'$  Other:   Heart:    '[x]'$   Regular rhythm             '[]'$  irregular rhythm    '[x]'$   Previous H&P reviewed, patient examined and there are NO CHANGES                 '[]'$   Previous H&P reviewed, patient examined and there are changes noted.   Alethia Berthold, MD 3/6/20245:13 PM

## 2022-10-21 ENCOUNTER — Other Ambulatory Visit: Payer: Self-pay | Admitting: Psychiatry

## 2022-10-21 MED ORDER — SULFAMETHOXAZOLE-TRIMETHOPRIM 800-160 MG PO TABS: 1.0000 | ORAL_TABLET | Freq: Two times a day (BID) | ORAL | 0 refills | Status: AC

## 2022-10-21 NOTE — Progress Notes (Signed)
At her ECT appointment on Wednesday, March 6 patient was complaining of symptoms of urinary tract infection.  Urine sample was obtained and sent to the lab and on review looks very indicative of urinary tract infection.  I tried to reach her on Wednesday and no one answered the phone and there was no way to leave a message.  Called back today and reached the patient.  She confirmed that she has no known allergy to sulfa drugs.  I have put in a prescription for her for Septra double strength one twice a day for 7 days and advised her that if symptoms do not clear up she should go to an urgent care.  She agrees.

## 2022-10-24 DIAGNOSIS — G2569 Other tics of organic origin: Secondary | ICD-10-CM | POA: Diagnosis not present

## 2022-10-24 DIAGNOSIS — F331 Major depressive disorder, recurrent, moderate: Secondary | ICD-10-CM | POA: Diagnosis not present

## 2022-10-24 DIAGNOSIS — F411 Generalized anxiety disorder: Secondary | ICD-10-CM | POA: Diagnosis not present

## 2022-10-28 NOTE — Progress Notes (Signed)
I,Sha'taria Tyson,acting as a Education administrator for Lelon Huh, MD.,have documented all relevant documentation on the behalf of Lelon Huh, MD,as directed by  Lelon Huh, MD while in the presence of Lelon Huh, MD.  Established patient visit   Patient: Karen [name]   DOB: [birth year]   47 y.o. Female  MRN: [id number] Visit Date: 10/31/2022  Today's healthcare provider: Lelon Huh, MD   No chief complaint on file.  Subjective    HPI  -Patient has not been using bi-pap machine properly. Patient has been falling asleep on the couch. But reports when she does use machine properly she still sleeps a lot.  -Patient would like to know if it is okay to take a wake pill as it was suggested by another provider  Medications: Outpatient Medications Prior to Visit  Medication Sig   sulfamethoxazole-trimethoprim (BACTRIM DS) 800-160 MG tablet Take 1 tablet by mouth 2 (two) times daily for 7 days.   albuterol (VENTOLIN HFA) 108 (90 Base) MCG/ACT inhaler Inhale 2 puffs into the lungs every 6 (six) hours as needed.   busPIRone (BUSPAR) 10 MG tablet TAKE 1 TABLET (10 MG TOTAL) BY MOUTH 2 (TWO) TIMES DAILY. TAKE ALONG WITH 15MG  TABLET TWICE A DAY   busPIRone (BUSPAR) 15 MG tablet TAKE 1 TABLET BY MOUTH TWICE A DAY   celecoxib (CELEBREX) 200 MG capsule TAKE 1 CAPSULE BY MOUTH TWICE A DAY   Ciclopirox 8 % KIT Apply 1 application  topically daily.   diclofenac Sodium (VOLTAREN) 1 % GEL APPLY AS NEEDED 3 TIMES A DAY   DULoxetine (CYMBALTA) 60 MG capsule Take 2 capsules (120 mg total) by mouth daily.   fentaNYL (DURAGESIC) 75 MCG/HR Place 1 patch onto the skin every 3 (three) days.   fexofenadine (ALLEGRA) 180 MG tablet Take 180 mg by mouth daily.   gabapentin (NEURONTIN) 800 MG tablet TAKE 1 TABLET BY MOUTH THREE TIMES A DAY   haloperidol (HALDOL) 0.5 MG tablet TAKE 1/2 OF A TABLET (0.25 MG TOTAL) BY MOUTH TWICE A DAY   lurasidone (LATUDA) 80 MG TABS tablet Take 1 tablet (80 mg total) by mouth  daily with supper.   naloxone (NARCAN) nasal spray 4 mg/0.1 mL Place 1 spray into the nose as needed.   omeprazole (PRILOSEC) 40 MG capsule TAKE 1 CAPSULE (40 MG TOTAL) BY MOUTH 2 (TWO) TIMES DAILY. SCHEDULE OFFICE VISIT   ondansetron (ZOFRAN-ODT) 4 MG disintegrating tablet DISSOLVE 1 TABLET IN MOUTH EVERY 8 HOURS FOR NAUSEA AND VOMITING   QUEtiapine (SEROQUEL) 25 MG tablet TAKE 1 TABLET BY MOUTH EVERYDAY AT BEDTIME   No facility-administered medications prior to visit.    Review of Systems     Objective    BP (!) 134/98 (BP Location: Left Arm, Patient Position: Sitting, Cuff Size: Normal)   Pulse 81   Wt 135 lb (61.2 kg)   SpO2 99%   BMI 27.27 kg/m    Physical Exam  General appearance: Well developed, well nourished female, cooperative and in no acute distress Head: Normocephalic, without obvious abnormality, atraumatic Respiratory: Respirations even and unlabored, normal respiratory rate Extremities: All extremities are intact.  Skin: Skin color, texture, turgor normal. No rashes seen  Psych: Appropriate mood and affect. Neurologic: Mental status: Alert, oriented to person, place, and time, thought content appropriate.   Assessment & Plan     1. Chronic bilateral back pain, unspecified back location refill - fentaNYL (DURAGESIC) 75 MCG/HR; Place 1 patch onto the skin every 3 (three) days.  Dispense: 10 patch; Refill: 0  2. Other fatigue  - Monospot  3. Seizure disorder ([place]) Continue current medications.  Continue regular follow up neurology.   4. Numbness and tingling in both hands  - Vitamin B12  5. Encounter for special screening examination for cardiovascular disorder  - Lipid panel  6. Mixed sleep apnea, Central predominant    7. Hypersomnia Trial  - Armodafinil (NUVIGIL) 150 MG tablet; Take 1 tablet (150 mg total) by mouth daily.  Dispense: 30 tablet; Refill: 2  8. Suspected UTI Return with sample for u/a         The entirety of the information  documented in the History of Present Illness, Review of Systems and Physical Exam were personally obtained by me. Portions of this information were initially documented by the CMA and reviewed by me for thoroughness and accuracy.     Lelon Huh, MD  [place] [phone] (phone) [phone] (fax)  [place]

## 2022-10-31 ENCOUNTER — Encounter: Payer: Self-pay | Admitting: Family Medicine

## 2022-10-31 ENCOUNTER — Ambulatory Visit (INDEPENDENT_AMBULATORY_CARE_PROVIDER_SITE_OTHER): Payer: Medicare Other | Admitting: Family Medicine

## 2022-10-31 VITALS — BP 125/91 | HR 81 | Wt 135.0 lb

## 2022-10-31 DIAGNOSIS — G4710 Hypersomnia, unspecified: Secondary | ICD-10-CM

## 2022-10-31 DIAGNOSIS — R3989 Other symptoms and signs involving the genitourinary system: Secondary | ICD-10-CM | POA: Diagnosis not present

## 2022-10-31 DIAGNOSIS — G40909 Epilepsy, unspecified, not intractable, without status epilepticus: Secondary | ICD-10-CM | POA: Diagnosis not present

## 2022-10-31 DIAGNOSIS — R200 Anesthesia of skin: Secondary | ICD-10-CM

## 2022-10-31 DIAGNOSIS — G8929 Other chronic pain: Secondary | ICD-10-CM | POA: Diagnosis not present

## 2022-10-31 DIAGNOSIS — G4739 Other sleep apnea: Secondary | ICD-10-CM | POA: Diagnosis not present

## 2022-10-31 DIAGNOSIS — R202 Paresthesia of skin: Secondary | ICD-10-CM

## 2022-10-31 DIAGNOSIS — M549 Dorsalgia, unspecified: Secondary | ICD-10-CM | POA: Diagnosis not present

## 2022-10-31 DIAGNOSIS — R5383 Other fatigue: Secondary | ICD-10-CM | POA: Diagnosis not present

## 2022-10-31 DIAGNOSIS — Z136 Encounter for screening for cardiovascular disorders: Secondary | ICD-10-CM

## 2022-10-31 LAB — POCT URINALYSIS DIPSTICK
Bilirubin, UA: NEGATIVE
Blood, UA: NEGATIVE
Glucose, UA: NEGATIVE
Ketones, UA: NEGATIVE
Leukocytes, UA: NEGATIVE
Nitrite, UA: NEGATIVE
Protein, UA: NEGATIVE
Spec Grav, UA: <=1.005 — AB (ref 1.010–1.025)
Urobilinogen, UA: 0.2 E.U./dL
pH, UA: 6 (ref 5.0–8.0)

## 2022-10-31 MED ORDER — FENTANYL 75 MCG/HR TD PT72: 1.0000 | MEDICATED_PATCH | TRANSDERMAL | 0 refills | Status: DC

## 2022-10-31 MED ORDER — ARMODAFINIL 150 MG PO TABS: 150.0000 mg | ORAL_TABLET | Freq: Every day | ORAL | 2 refills | Status: DC

## 2022-11-04 ENCOUNTER — Telehealth: Payer: Self-pay

## 2022-11-04 NOTE — Telephone Encounter (Signed)
Copied from [place] [id number]. Topic: General - Other >> Nov 04, 2022  9:12 AM Oley Balm A wrote: Reason for CRM: Pt states that she had a missed call from the office. Could not find any lab results, TE, or CRM. Please have someone call the pt back.

## 2022-11-07 NOTE — Telephone Encounter (Signed)
Unable to determine who called patient.  Made several attempts to let her know this with no answer.  VM is full.  Will close out message at this point

## 2022-11-08 ENCOUNTER — Telehealth: Payer: Self-pay | Admitting: Internal Medicine

## 2022-11-08 NOTE — Telephone Encounter (Signed)
Pt. Calling to get feed back about Armodafinil (NUVIGIL) 150 MG tablet  she want Dr. Janee Morn input on taking new medication

## 2022-11-09 NOTE — Telephone Encounter (Signed)
Patient is returning a call regarding taking a wake pill.  Please call patient back to discuss further at [phone]

## 2022-11-09 NOTE — Telephone Encounter (Signed)
ATC X 1 mailbox is full will attempt to call back later

## 2022-11-11 NOTE — Telephone Encounter (Signed)
Patient was prescribed Nuvigil from pcp Dr. Caryn Section for Hypersomnia. she wants to know if Dr. Annamaria Boots thinks taking this medication will be okay.  Dr. Annamaria Boots can you please advise when you have a minute

## 2022-11-11 NOTE — Telephone Encounter (Signed)
PT still waiting for a CB about a new medication she is taking. Please see below.  [phone]

## 2022-11-12 NOTE — Telephone Encounter (Signed)
Nuvigil (armodafinil) is a commonly used stimulant med. Fine to try it and see what she thinks.

## 2022-11-14 NOTE — Telephone Encounter (Signed)
Spoke with patient advised of Dr. Bertrum Sol recommendation. She verbalized understanding. NFN

## 2022-11-15 ENCOUNTER — Other Ambulatory Visit: Payer: Self-pay | Admitting: Psychiatry

## 2022-11-15 DIAGNOSIS — R200 Anesthesia of skin: Secondary | ICD-10-CM

## 2022-11-16 ENCOUNTER — Encounter: Payer: Self-pay | Admitting: Anesthesiology

## 2022-11-16 ENCOUNTER — Inpatient Hospital Stay: Admission: RE | Admit: 2022-11-16 | Payer: Medicare Other | Source: Ambulatory Visit

## 2022-11-17 ENCOUNTER — Other Ambulatory Visit: Payer: Self-pay | Admitting: Family Medicine

## 2022-11-17 DIAGNOSIS — R5383 Other fatigue: Secondary | ICD-10-CM | POA: Diagnosis not present

## 2022-11-17 DIAGNOSIS — G4710 Hypersomnia, unspecified: Secondary | ICD-10-CM

## 2022-11-17 DIAGNOSIS — R202 Paresthesia of skin: Secondary | ICD-10-CM | POA: Diagnosis not present

## 2022-11-17 DIAGNOSIS — Z136 Encounter for screening for cardiovascular disorders: Secondary | ICD-10-CM | POA: Diagnosis not present

## 2022-11-17 DIAGNOSIS — R200 Anesthesia of skin: Secondary | ICD-10-CM | POA: Diagnosis not present

## 2022-11-17 NOTE — Telephone Encounter (Signed)
Please send RX to pharmacy. RX written on 3/18 says "print"  Medication Refill - Medication: Armodafinil (NUVIGIL) 150 MG tablet   Has the patient contacted their pharmacy? No. (Agent: If no, request that the patient contact the pharmacy for the refill. If patient does not wish to contact the pharmacy document the reason why and proceed with request.) (Agent: If yes, when and what did the pharmacy advise?)  Preferred Pharmacy (with phone number or street name):  [id number] - [place], [place] Phone: [phone]  Fax: [phone]    Has the patient been seen for an appointment in the last year OR does the patient have an upcoming appointment? Yes.    Agent: Please be advised that RX refills may take up to 3 business days. We ask that you follow-up with your pharmacy.

## 2022-11-17 NOTE — Telephone Encounter (Signed)
Requested medication (s) are due for refill today: Yes  Requested medication (s) are on the active medication list: Yes  Last refill:  10/31/22  Future visit scheduled: Yes  Notes to clinic:  Not delegated. [facility].    Requested Prescriptions  Pending Prescriptions Disp Refills   Armodafinil (NUVIGIL) 150 MG tablet 30 tablet 2    Sig: Take 1 tablet (150 mg total) by mouth daily.     Not Delegated - Psychiatry:  Stimulants/ADHD Failed - 11/17/2022  2:17 PM      Failed - This refill cannot be delegated      Failed - Urine Drug Screen completed in last 360 days      Failed - Last BP in normal range    BP Readings from Last 1 Encounters:  10/31/22 (!) 125/91         Passed - Last Heart Rate in normal range    Pulse Readings from Last 1 Encounters:  10/31/22 81         Passed - Valid encounter within last 6 months    Recent Outpatient Visits           2 weeks ago Other fatigue   [place], Donald E, MD   4 months ago Ehlers-Danlos syndrome   [place], Donald E, MD   6 months ago Tinea pedis of right foot   [place], Felida, PA-C   6 months ago Need for influenza vaccination   [facility] Birdie Sons, MD   8 months ago Pain in toes of both feet   [place], Donald E, MD       Future Appointments             In 4 weeks Ralene Bathe, MD [place]   In 4 weeks Caryn Section, Kirstie Peri, MD [facility], [place]   In 7 months Ralene Bathe, MD [place]

## 2022-11-18 LAB — LIPID PANEL
Chol/HDL Ratio: 3.7 ratio (ref 0.0–4.4)
Cholesterol, Total: 294 mg/dL — ABNORMAL HIGH (ref 100–199)
HDL: 80 mg/dL (ref >39)
LDL Chol Calc (NIH): 188 mg/dL — ABNORMAL HIGH (ref 0–99)
Triglycerides: 144 mg/dL (ref 0–149)
VLDL Cholesterol Cal: 26 mg/dL (ref 5–40)

## 2022-11-18 LAB — MONONUCLEOSIS SCREEN: Mono Screen: NEGATIVE

## 2022-11-18 LAB — VITAMIN B12: Vitamin B-12: 1247 pg/mL — ABNORMAL HIGH (ref 232–1245)

## 2022-11-20 MED ORDER — ARMODAFINIL 150 MG PO TABS: 150.0000 mg | ORAL_TABLET | Freq: Every day | ORAL | 2 refills | Status: DC

## 2022-11-21 ENCOUNTER — Telehealth: Payer: Self-pay | Admitting: Family Medicine

## 2022-11-21 DIAGNOSIS — F331 Major depressive disorder, recurrent, moderate: Secondary | ICD-10-CM | POA: Diagnosis not present

## 2022-11-21 DIAGNOSIS — F411 Generalized anxiety disorder: Secondary | ICD-10-CM | POA: Diagnosis not present

## 2022-11-21 DIAGNOSIS — G2569 Other tics of organic origin: Secondary | ICD-10-CM | POA: Diagnosis not present

## 2022-11-21 NOTE — Telephone Encounter (Signed)
[facility] Key: [id number] Name: [place] Armodafinil 150 mg tablets

## 2022-11-23 NOTE — Telephone Encounter (Signed)
Patient inquiring on the status of her medication Armodafinil (NUVIGIL) 150 MG

## 2022-11-30 DIAGNOSIS — F331 Major depressive disorder, recurrent, moderate: Secondary | ICD-10-CM | POA: Diagnosis not present

## 2022-11-30 DIAGNOSIS — G2569 Other tics of organic origin: Secondary | ICD-10-CM | POA: Diagnosis not present

## 2022-11-30 DIAGNOSIS — F411 Generalized anxiety disorder: Secondary | ICD-10-CM | POA: Diagnosis not present

## 2022-11-30 NOTE — Telephone Encounter (Signed)
No Eligibilty for patient on CoverMyMeds

## 2022-12-04 ENCOUNTER — Encounter: Payer: Self-pay | Admitting: Family Medicine

## 2022-12-04 DIAGNOSIS — E785 Hyperlipidemia, unspecified: Secondary | ICD-10-CM | POA: Insufficient documentation

## 2022-12-05 ENCOUNTER — Encounter: Payer: Self-pay | Admitting: Family Medicine

## 2022-12-05 ENCOUNTER — Ambulatory Visit: Payer: Self-pay

## 2022-12-05 DIAGNOSIS — G8929 Other chronic pain: Secondary | ICD-10-CM

## 2022-12-05 NOTE — Telephone Encounter (Signed)
Chief Complaint: Need refill Fentanyl patches Symptoms: chills, sweating, pain 8/10 all over, stretching causes pain Frequency: Placed last patch 3 days ago Pertinent Negatives: Patient denies N/A Disposition: ED /[] Urgent Care (no appt availability in office) / Appointment(In office/virtual)/  [facility]/ Home Care/ Refused Recommended Disposition /[] [facility]/  Follow-up with PCP Additional Notes: Patient called in for refill of fentanyl patches saying it's an emergency that she gets it refilled today due to she's due to change one today and she's in severe pain, even stretching is causing pain, chills, sweating. She says she thinks she is having withdrawal symptoms, but is not sure. I advised if her symptoms worsen to go to the ED. She said what will they do. I advised a workup/evaluation of symptoms. Advised I will send this request to Dr. Sherrie Mustache for refill.   [id number] [place], [place] - [address]     Reason for Disposition  Caller requesting a CONTROLLED substance prescription refill (e.g., narcotics, ADHD medicines)  Answer Assessment - Initial Assessment Questions 1. ONSET: "When did the pain begin?"      This afternoon 2. LOCATION: "Where does it hurt?" (upper, mid or lower back)     All over the back 3. SEVERITY: "How bad is the pain?"  (e.g., Scale 1-10; mild, moderate, or severe)   - MILD (1-3): Doesn't interfere with normal activities.    - MODERATE (4-7): Interferes with normal activities or awakens from sleep.    - SEVERE (8-10): Excruciating pain, unable to do any normal activities.      8 4. PATTERN: "Is the pain constant?" (e.g., yes, no; constant, intermittent)      Constant 5. RADIATION: "Does the pain shoot into your legs or somewhere else?"     All over body pain 6. CAUSE:  "What do you think is causing the back pain?"      Chronic 7. MEDICINES: "What have you taken so far for the pain?" (e.g.,  nothing, acetaminophen, NSAIDS)     On Fentanyl patches, Tylenol and Seroquel 9. NEUROLOGIC SYMPTOMS: "Do you have any weakness, numbness, or problems with bowel/bladder control?"     Chronic Ehlers-danlos syndrome 10. OTHER SYMPTOMS: "Do you have any other symptoms?" (e.g., fever, abdomen pain, burning with urination, blood in urine)       Chills and sweating  Answer Assessment - Initial Assessment Questions 1. DRUG NAME: "What medicine do you need to have refilled?"     Fentanyl 2. REFILLS REMAINING: "How many refills are remaining?" (Note: The label on the medicine or pill bottle will show how many refills are remaining. If there are no refills remaining, then a renewal may be needed.)     0 3. EXPIRATION DATE: "What is the expiration date?" (Note: The label states when the prescription will expire, and thus can no longer be refilled.)     N/A 4. PRESCRIBING HCP: "Who prescribed it?" Reason: If prescribed by specialist, call should be referred to that group.     Dr. Sherrie Mustache 5. SYMPTOMS: "Do you have any symptoms?"     Back pain, stretching, chills, sweating  Protocols used: Back Pain-A-AH, Medication Refill and Renewal Call-A-AH

## 2022-12-05 NOTE — Telephone Encounter (Signed)
This encounter was created in error - please disregard.

## 2022-12-06 MED ORDER — FENTANYL 75 MCG/HR TD PT72: 1.0000 | MEDICATED_PATCH | TRANSDERMAL | 0 refills | Status: DC

## 2022-12-09 ENCOUNTER — Other Ambulatory Visit: Payer: Self-pay | Admitting: Psychiatry

## 2022-12-09 ENCOUNTER — Encounter: Payer: Self-pay | Admitting: Anesthesiology

## 2022-12-09 ENCOUNTER — Ambulatory Visit: Payer: Self-pay | Admitting: Anesthesiology

## 2022-12-09 ENCOUNTER — Encounter
Admission: RE | Admit: 2022-12-09 | Discharge: 2022-12-09 | Disposition: A | Discharge status: Home / Self Care | Payer: Medicare Other | Source: Ambulatory Visit | Attending: Family Medicine | Admitting: Family Medicine

## 2022-12-09 VITALS — BP 137/90 | HR 98 | Temp 97.8°F | Resp 19

## 2022-12-09 DIAGNOSIS — R200 Anesthesia of skin: Secondary | ICD-10-CM | POA: Insufficient documentation

## 2022-12-09 DIAGNOSIS — R569 Unspecified convulsions: Secondary | ICD-10-CM | POA: Insufficient documentation

## 2022-12-09 DIAGNOSIS — F331 Major depressive disorder, recurrent, moderate: Secondary | ICD-10-CM | POA: Diagnosis not present

## 2022-12-09 DIAGNOSIS — F332 Major depressive disorder, recurrent severe without psychotic features: Secondary | ICD-10-CM | POA: Diagnosis not present

## 2022-12-09 DIAGNOSIS — J45909 Unspecified asthma, uncomplicated: Secondary | ICD-10-CM | POA: Diagnosis not present

## 2022-12-09 DIAGNOSIS — R002 Palpitations: Secondary | ICD-10-CM | POA: Insufficient documentation

## 2022-12-09 DIAGNOSIS — Z01818 Encounter for other preprocedural examination: Secondary | ICD-10-CM | POA: Diagnosis not present

## 2022-12-09 MED ORDER — ONDANSETRON HCL 4 MG/2ML IJ SOLN: 4.0000 mg | Freq: Once | INTRAMUSCULAR | Status: DC

## 2022-12-09 MED ORDER — LABETALOL HCL 5 MG/ML IV SOLN
INTRAVENOUS | Status: DC | PRN
  Administered 2022-12-09: 5 mg via INTRAVENOUS

## 2022-12-09 MED ORDER — MIDAZOLAM HCL 2 MG/2ML IJ SOLN
INTRAMUSCULAR | Status: AC
  Filled 2022-12-09: qty 2

## 2022-12-09 MED ORDER — SUCCINYLCHOLINE CHLORIDE 200 MG/10ML IV SOSY
PREFILLED_SYRINGE | INTRAVENOUS | Status: DC | PRN
  Administered 2022-12-09: 60 mg via INTRAVENOUS

## 2022-12-09 MED ORDER — MIDAZOLAM HCL 2 MG/2ML IJ SOLN
INTRAMUSCULAR | Status: DC | PRN
  Administered 2022-12-09: 2 mg via INTRAVENOUS

## 2022-12-09 MED ORDER — MIDAZOLAM HCL 2 MG/2ML IJ SOLN: 2.0000 mg | Freq: Once | INTRAMUSCULAR | Status: DC

## 2022-12-09 MED ORDER — PROPOFOL 10 MG/ML IV BOLUS
INTRAVENOUS | Status: DC | PRN
  Administered 2022-12-09: 40 mg via INTRAVENOUS

## 2022-12-09 MED ORDER — SODIUM CHLORIDE 0.9 % IV SOLN: 500.0000 mL | Freq: Once | INTRAVENOUS | Status: DC

## 2022-12-09 MED ORDER — KETAMINE HCL 50 MG/5ML IJ SOSY
PREFILLED_SYRINGE | INTRAMUSCULAR | Status: AC
  Filled 2022-12-09: qty 5

## 2022-12-09 MED ORDER — KETAMINE HCL 10 MG/ML IJ SOLN
INTRAMUSCULAR | Status: DC | PRN
  Administered 2022-12-09: 40 mg via INTRAVENOUS

## 2022-12-09 MED ORDER — DEXTROSE-NACL 5-0.45 % IV SOLN: INTRAVENOUS | Status: DC | PRN

## 2022-12-09 NOTE — Anesthesia Preprocedure Evaluation (Addendum)
Anesthesia Evaluation  Patient identified by MRN, date of birth, ID band Patient awake    Reviewed: Allergy & Precautions, NPO status , Patient's Chart, lab work & pertinent test results  Airway Mallampati: II  TM Distance: >3 FB Neck ROM: full    Dental  (+) Poor Dentition   Pulmonary asthma    Pulmonary exam normal breath sounds clear to auscultation       Cardiovascular Exercise Tolerance: Good Normal cardiovascular exam+ dysrhythmias (palpitations)  Rhythm:Regular     Neuro/Psych  Headaches, Seizures -,   Anxiety Depression     negative psych ROS   GI/Hepatic negative GI ROS, Neg liver ROS,,,  Endo/Other  negative endocrine ROS    Renal/GU negative Renal ROS  negative genitourinary   Musculoskeletal  (+)  Fibromyalgia -  Abdominal Normal abdominal exam  (+)   Peds negative pediatric ROS (+)  Hematology negative hematology ROS (+)   Anesthesia Other Findings Recent onset of palpitations short duration, about every two weeks, associated with chest pain. EKG today was NSR.   Past Medical History: No date: Ehlers-Danlos syndrome- wheelchair bound No date: Fibromyalgia No date: Headache No date: Polyp of descending colon No date: Sleep apnea No date: Tics of organic origin No date: Transient alteration of awareness  Past Surgical History: 02/13/2018: COLONOSCOPY WITH PROPOFOL; N/A     Comment:  Procedure: COLONOSCOPY WITH PROPOFOL;  Surgeon: Midge Minium, MD;  Location: [facility] ENDOSCOPY;  Service:               Endoscopy;  Laterality: N/A; 01/11/2022: COLONOSCOPY WITH PROPOFOL; N/A     Comment:  Procedure: COLONOSCOPY WITH PROPOFOL;  Surgeon: Midge Minium, MD;  Location: [facility] ENDOSCOPY;  Service:               Endoscopy;  Laterality: N/A; 08/16/2007: DILATION AND CURETTAGE OF UTERUS 02/13/2018: ESOPHAGOGASTRODUODENOSCOPY (EGD) WITH PROPOFOL     Comment:  Procedure:  ESOPHAGOGASTRODUODENOSCOPY (EGD) WITH               PROPOFOL;  Surgeon: Midge Minium, MD;  Location: [facility]               ENDOSCOPY;  Service: Endoscopy;; 08/15/1988: EYE SURGERY; Left     Comment:  3 Surgeries on left eye to correct crossed eye 08/15/2001: LAPAROSCOPY; Left     Comment:  Fallopian Tube 08/15/1996: MANDIBLE SURGERY 08/15/1985: OTHER SURGICAL HISTORY; Right     Comment:  3 Surgeries to repair broken arm No date: OTHER SURGICAL HISTORY; Left     Comment:  3 surgeries on her left thigh as an infant 08/15/1996: OTHER SURGICAL HISTORY     Comment:  Jaw surgery 08/15/1985: Right arm surgery     Comment:  x 3 due to fracture 06/13/2019: TOENAIL EXCISION; Bilateral     Comment:  Procedure: BILATERAL SECOND TOE PARTIAL NAIL ABLATION;                Surgeon: Terance Hart, MD;  Location: [facility];  Service: Orthopedics;  Laterality:               Bilateral;  SURGERY REQUEST TIME 1 HOUR 12/13/2002: TONSILLECTOMY No date: TONSILLECTOMY  BMI    Body Mass Index: 27.06 kg/m  Reproductive/Obstetrics negative OB ROS                             Anesthesia Physical Anesthesia Plan  ASA: 3  Anesthesia Plan: General   Post-op Pain Management:    Induction: Intravenous  PONV Risk Score and Plan: Propofol infusion and TIVA  Airway Management Planned: Natural Airway and Mask  Additional Equipment:   Intra-op Plan:   Post-operative Plan:   Informed Consent: I have reviewed the patients History and Physical, chart, labs and discussed the procedure including the risks, benefits and alternatives for the proposed anesthesia with the patient or authorized representative who has indicated his/her understanding and acceptance.     Dental Advisory Given  Plan Discussed with: CRNA and Surgeon  Anesthesia Plan Comments:        Anesthesia Quick Evaluation

## 2022-12-09 NOTE — Transfer of Care (Signed)
Immediate Anesthesia Transfer of Care Note  Patient: Karen [name]  Procedure(s) Performed: ECT TX  Patient Location: PACU  Anesthesia Type:General  Level of Consciousness: sedated  Airway & Oxygen Therapy: Patient Spontanous Breathing and Patient connected to face mask oxygen  Post-op Assessment: Report given to RN and Post -op Vital signs reviewed and stable  Post vital signs: Reviewed  Last Vitals:  Vitals Value Taken Time  BP 122/77   Temp    Pulse 84 12/09/22 1424  Resp    SpO2 96 12/09/22 1424  Vitals shown include unvalidated device data.  Last Pain: There were no vitals filed for this visit.       Complications: No notable events documented.

## 2022-12-09 NOTE — H&P (Signed)
Karen [name] is an 47 y.o. female.   Chief Complaint: none psych. Occ palpitations HPI: no change from prior  Past Medical History:  Diagnosis Date   Ehlers-Danlos syndrome    Fibromyalgia    Headache    Polyp of descending colon    Sleep apnea    Tics of organic origin    Transient alteration of awareness     Past Surgical History:  Procedure Laterality Date   COLONOSCOPY WITH PROPOFOL N/A 02/13/2018   Procedure: COLONOSCOPY WITH PROPOFOL;  Surgeon: Midge Minium, MD;  Location: [facility] END[facility];  Service: Endoscopy;  Laterality: N/A;   COLONOSCOPY WITH PROPOFOL N/A 01/11/2022   Procedure: COLONOSCOPY WITH PROPOFOL;  Surgeon: Midge Minium, MD;  Location: [facility] END[facility];  Service: Endoscopy;  Laterality: N/A;   DILATION AND CURETTAGE OF UTERUS  08/16/2007   ESOPHAGOGASTRODUODENOSCOPY (EGD) WITH PROPOFOL  02/13/2018   Procedure: ESOPHAGOGASTRODUODENOSCOPY (EGD) WITH PROPOFOL;  Surgeon: Midge Minium, MD;  Location: [facility] ENDOSCOPY;  Service: Endoscopy;;   EYE SURGERY Left 08/15/1988   3 Surgeries on left eye to correct crossed eye   LAPAROSCOPY Left 08/15/2001   Fallopian Tube   MANDIBLE SURGERY  08/15/1996   OTHER SURGICAL HISTORY Right 08/15/1985   3 Surgeries to repair broken arm   OTHER SURGICAL HISTORY Left    3 surgeries on her left thigh as an infant   OTHER SURGICAL HISTORY  08/15/1996   Jaw surgery   Right arm surgery  08/15/1985   x 3 due to fracture   TOENAIL EXCISION Bilateral 06/13/2019   Procedure: BILATERAL SECOND TOE PARTIAL NAIL ABLATION;  Surgeon: Terance Hart, MD;  Location: [facility];  Service: Orthopedics;  Laterality: Bilateral;  SURGERY REQUEST TIME 1 HOUR   TONSILLECTOMY  12/13/2002   TONSILLECTOMY      Family History  Problem Relation Age of Onset   Depression Mother    Stroke Mother    Fibromyalgia Mother    Osteoarthritis Mother    Asthma Brother    Depression Brother    Migraines Brother    Asperger's syndrome  Brother    Other Brother        Ehlers-Danlos Syndrome   Cancer Maternal Aunt    Asperger's syndrome Maternal Aunt    Breast cancer Maternal Aunt    Heart disease Paternal Aunt    Heart disease Paternal Uncle    Cervical cancer Maternal Grandmother    Osteoporosis Maternal Grandmother        Died at 41   Osteoarthritis Maternal Grandmother    Colon cancer Maternal Grandfather    Asthma Maternal Grandfather    Asperger's syndrome Maternal Grandfather    Emphysema Maternal Grandfather        Died at 87   Prostate cancer Maternal Grandfather    Heart attack Paternal Grandfather        Died at 61   Asthma Paternal Grandfather    Tuberculosis Paternal Grandfather    Cancer Cousin    Asperger's syndrome Cousin    Asperger's syndrome Other    Heart Problems Paternal Grandmother        Died at 68   Ehlers-Danlos syndrome Father    Ehlers-Danlos syndrome Brother    Social History:  reports that she has never smoked. She has never used smokeless tobacco. She reports that she does not drink alcohol and does not use drugs.  Allergies:  Allergies  Allergen Reactions   Augmentin  [Amoxicillin-Pot Clavulanate] Diarrhea   Chocolate Flavor  Other reaction(s): Other (See Comments) Wheezing, acne   Demerol [Meperidine] Other (See Comments)    Slow to wake up when this drug is given.    Keflex [Cephalexin] Nausea And Vomiting   Morphine And Related Nausea And Vomiting   Tape Dermatitis    Must use paper tape only   Toradol [Ketorolac Tromethamine] Nausea And Vomiting   Amoxicillin     Other reaction(s): Unknown   Chocolate     GI distress   Nsaids     patient develops ulcers Other reaction(s): Other (See Comments) patient develops ulcers   Strawberry Extract     GI distress    (Not in a hospital admission)   No results found for this or any previous visit (from the past 48 hour(s)). No results found.  Review of Systems  Constitutional: Negative.   HENT: Negative.     Eyes: Negative.   Respiratory: Negative.    Cardiovascular: Negative.   Gastrointestinal: Negative.   Musculoskeletal: Negative.   Skin: Negative.   Neurological: Negative.   Psychiatric/Behavioral: Negative.      There were no vitals taken for this visit. Physical Exam Vitals reviewed.  Constitutional:      Appearance: She is well-developed.  HENT:     Head: Normocephalic and atraumatic.  Eyes:     Conjunctiva/sclera: Conjunctivae normal.     Pupils: Pupils are equal, round, and reactive to light.  Cardiovascular:     Heart sounds: Normal heart sounds.  Pulmonary:     Effort: Pulmonary effort is normal.  Abdominal:     Palpations: Abdomen is soft.  Musculoskeletal:        General: Normal range of motion.     Cervical back: Normal range of motion.  Skin:    General: Skin is warm and dry.  Neurological:     General: No focal deficit present.     Mental Status: She is alert.  Psychiatric:        Mood and Affect: Mood normal.        Behavior: Behavior normal.        Thought Content: Thought content normal.        Judgment: Judgment normal.      Assessment/Plan Treatment today and follow up 4 weeks  Mordecai Rasmussen, MD 12/09/2022, 12:30 PM

## 2022-12-09 NOTE — Anesthesia Postprocedure Evaluation (Signed)
Anesthesia Post Note  Patient: Karen [name]  Procedure(s) Performed: ECT TX  Patient location during evaluation: PACU Anesthesia Type: General Level of consciousness: awake and alert Pain management: pain level controlled Vital Signs Assessment: post-procedure vital signs reviewed and stable Respiratory status: spontaneous breathing, nonlabored ventilation, respiratory function stable and patient connected to nasal cannula oxygen Cardiovascular status: blood pressure returned to baseline and stable Postop Assessment: no apparent nausea or vomiting Anesthetic complications: no   No notable events documented.   Last Vitals:  Vitals:   12/09/22 1430 12/09/22 1435  BP: 126/85 (!) 131/107  Pulse: 92 (!) 103  Resp: 15 17  Temp:    SpO2: 98% 100%    Last Pain:  Vitals:   12/09/22 1423  PainSc: 0-No pain                 [name]

## 2022-12-09 NOTE — Procedures (Signed)
ECT SERVICES Physician's Interval Evaluation & Treatment Note  Patient Identification: Karen [name] MRN:  [id number] Date of Evaluation:  12/09/2022 TX #: [id number]  MADRS:   MMSE:   P.E. Findings:  No change to physical exam  Psychiatric Interval Note:  Reports that mood has been quite stable  Subjective:  Patient is a 47 y.o. female seen for evaluation for Electroconvulsive Therapy. No new complaint mentally.  She did mention that she was having some heart palpitations.  EKG was performed and was very normal  Treatment Summary:   []   Right Unilateral             [x]  Bilateral   % Energy : 1.0 ms 40%   Impedance: 2320 ohms  Seizure Energy Index: 13,320 V squared  Postictal Suppression Index: 85%  Seizure Concordance Index: 97%  Medications  Pre Shock: Zofran 4 mg labetalol 20 mg ketamine 40 mg propofol 40 mg succinylcholine 60 mg  Post Shock: Versed 2 mg  Seizure Duration: EMG 28 seconds EEG 54 seconds   Comments: Follow-up 4 weeks  Lungs:  [x]   Clear to auscultation               []  Other:   Heart:    [x]   Regular rhythm             []  irregular rhythm    [x]   Previous H&P reviewed, patient examined and there are NO CHANGES                 []   Previous H&P reviewed, patient examined and there are changes noted.   Mordecai Rasmussen, MD 4/26/20245:00 PM

## 2022-12-15 ENCOUNTER — Ambulatory Visit (INDEPENDENT_AMBULATORY_CARE_PROVIDER_SITE_OTHER): Payer: Medicare Other | Admitting: Dermatology

## 2022-12-15 VITALS — BP 124/88 | HR 73

## 2022-12-15 DIAGNOSIS — R21 Rash and other nonspecific skin eruption: Secondary | ICD-10-CM | POA: Diagnosis not present

## 2022-12-15 DIAGNOSIS — Z79899 Other long term (current) drug therapy: Secondary | ICD-10-CM

## 2022-12-15 DIAGNOSIS — L814 Other melanin hyperpigmentation: Secondary | ICD-10-CM

## 2022-12-15 MED ORDER — MOMETASONE FUROATE 0.1 % EX CREA: TOPICAL_CREAM | CUTANEOUS | 0 refills | Status: DC

## 2022-12-15 NOTE — Patient Instructions (Signed)
Due to recent changes in healthcare laws, you may see results of your pathology and/or laboratory studies on MyChart before the doctors have had a chance to review them. We understand that in some cases there may be results that are confusing or concerning to you. Please understand that not all results are received at the same time and often the doctors may need to interpret multiple results in order to provide you with the best plan of care or course of treatment. Therefore, we ask that you please give us 2 business days to thoroughly review all your results before contacting the office for clarification. Should we see a critical lab result, you will be contacted sooner.   If You Need Anything After Your Visit  If you have any questions or concerns for your doctor, please call our main line at [phone] and press option 4 to reach your doctor's medical assistant. If no one answers, please leave a voicemail as directed and we will return your call as soon as possible. Messages left after 4 pm will be answered the following business day.   You may also send us a message via MyChart. We typically respond to MyChart messages within 1-2 business days.  For prescription refills, please ask your pharmacy to contact our office. Our fax number is [phone].  If you have an urgent issue when the clinic is closed that cannot wait until the next business day, you can page your doctor at the number below.    Please note that while we do our best to be available for urgent issues outside of office hours, we are not available 24/7.   If you have an urgent issue and are unable to reach us, you may choose to seek medical care at your doctor's office, retail clinic, urgent care center, or emergency room.  If you have a medical emergency, please immediately call 911 or go to the emergency department.  Pager Numbers  - Dr. Kowalski: [phone]  - Dr. Moye: [phone]  - Dr. Stewart:  [phone]  In the event of inclement weather, please call our main line at [phone] for an update on the status of any delays or closures.  Dermatology Medication Tips: Please keep the boxes that topical medications come in in order to help keep track of the instructions about where and how to use these. Pharmacies typically print the medication instructions only on the boxes and not directly on the medication tubes.   If your medication is too expensive, please contact our office at [phone] option 4 or send us a message through MyChart.   We are unable to tell what your co-pay for medications will be in advance as this is different depending on your insurance coverage. However, we may be able to find a substitute medication at lower cost or fill out paperwork to get insurance to cover a needed medication.   If a prior authorization is required to get your medication covered by your insurance company, please allow us 1-2 business days to complete this process.  Drug prices often vary depending on where the prescription is filled and some pharmacies may offer cheaper prices.  The website www.goodrx.com contains coupons for medications through different pharmacies. The prices here do not account for what the cost may be with help from insurance (it may be cheaper with your insurance), but the website can give you the price if you did not use any insurance.  - You can print the associated coupon and take it with   your prescription to the pharmacy.  - You may also stop by our office during regular business hours and pick up a GoodRx coupon card.  - If you need your prescription sent electronically to a different pharmacy, notify our office through [place] MyChart or by phone at [phone] option 4.     Si Usted Necesita Algo Despus de Su Visita  Tambin puede enviarnos un mensaje a travs de MyChart. Por lo general respondemos a los mensajes de MyChart en el transcurso de 1 a 2  das hbiles.  Para renovar recetas, por favor pida a su farmacia que se ponga en contacto con nuestra oficina. Nuestro nmero de fax es el [phone].  Si tiene un asunto urgente cuando la clnica est cerrada y que no puede esperar hasta el siguiente da hbil, puede llamar/localizar a su doctor(a) al nmero que aparece a continuacin.   Por favor, tenga en cuenta que aunque hacemos todo lo posible para estar disponibles para asuntos urgentes fuera del horario de oficina, no estamos disponibles las 24 horas del da, los 7 das de la semana.   Si tiene un problema urgente y no puede comunicarse con nosotros, puede optar por buscar atencin mdica  en el consultorio de su doctor(a), en una clnica privada, en un centro de atencin urgente o en una sala de emergencias.  Si tiene una emergencia mdica, por favor llame inmediatamente al 911 o vaya a la sala de emergencias.  Nmeros de bper  - Dr. Kowalski: [phone]  - Dra. Moye: [phone]  - Dra. Stewart: [phone]  En caso de inclemencias del tiempo, por favor llame a nuestra lnea principal al [phone] para una actualizacin sobre el estado de cualquier retraso o cierre.  Consejos para la medicacin en dermatologa: Por favor, guarde las cajas en las que vienen los medicamentos de uso tpico para ayudarle a seguir las instrucciones sobre dnde y cmo usarlos. Las farmacias generalmente imprimen las instrucciones del medicamento slo en las cajas y no directamente en los tubos del medicamento.   Si su medicamento es muy caro, por favor, pngase en contacto con nuestra oficina llamando al [phone] y presione la opcin 4 o envenos un mensaje a travs de MyChart.   No podemos decirle cul ser su copago por los medicamentos por adelantado ya que esto es diferente dependiendo de la cobertura de su seguro. Sin embargo, es posible que podamos encontrar un medicamento sustituto a menor costo o llenar un formulario para que el  seguro cubra el medicamento que se considera necesario.   Si se requiere una autorizacin previa para que su compaa de seguros cubra su medicamento, por favor permtanos de 1 a 2 das hbiles para completar este proceso.  Los precios de los medicamentos varan con frecuencia dependiendo del lugar de dnde se surte la receta y alguna farmacias pueden ofrecer precios ms baratos.  El sitio web www.goodrx.com tiene cupones para medicamentos de diferentes farmacias. Los precios aqu no tienen en cuenta lo que podra costar con la ayuda del seguro (puede ser ms barato con su seguro), pero el sitio web puede darle el precio si no utiliz ningn seguro.  - Puede imprimir el cupn correspondiente y llevarlo con su receta a la farmacia.  - Tambin puede pasar por nuestra oficina durante el horario de atencin regular y recoger una tarjeta de cupones de GoodRx.  - Si necesita que su receta se enve electrnicamente a una farmacia diferente, informe a nuestra oficina a travs de MyChart de [place]   o por telfono llamando al [phone] y presione la opcin 4.  

## 2022-12-15 NOTE — Progress Notes (Signed)
   Follow Up Visit   Subjective  Karen [name] is a 47 y.o. female who presents for the following: Rash on the arms and hands for a few days. Doesn't itch per patient and she has had similar rashes before, but this won't resolve.   The patient has spots, moles and lesions to be evaluated, some may be new or changing and the patient may have concern these could be cancer.  The following portions of the chart were reviewed this encounter and updated as appropriate: medications, allergies, medical history  Review of Systems:  No other skin or systemic complaints except as noted in HPI or Assessment and Plan.  Objective  Well appearing patient in no apparent distress; mood and affect are within normal limits. A focused examination was performed of the following areas: The face, arms, and hands Relevant exam findings are noted in the Assessment and Plan.   Assessment & Plan   Rash  Lentigo  Medication management   RASH - bite reaction vs papular urticaria vs papular eczema   Exam: Fine papular urticaria on the arms.   Chronic and persistent condition with duration or expected duration over one year. Condition is bothersome/symptomatic for patient. Currently flared.  Atopic dermatitis (eczema) is a chronic, relapsing, pruritic condition that can significantly affect quality of life. It is often associated with allergic rhinitis and/or asthma and can require treatment with topical medications, phototherapy, or in severe cases biologic injectable medication (Dupixent; Adbry) or Oral JAK inhibitors.  Treatment Plan: Start Mometasone 0.1% cream BID x 2 weeks. Topical steroids (such as triamcinolone, fluocinolone, fluocinonide, mometasone, clobetasol, halobetasol, betamethasone, hydrocortisone) can cause thinning and lightening of the skin if they are used for too long in the same area. Your physician has selected the right strength medicine for your problem and area affected on the  body. Please use your medication only as directed by your physician to prevent side effects.   Take Claritin 2 po QD x 2 weeks.   If not improving after two weeks contact the office.   LENTIGINES Exam: scattered tan macules Due to sun exposure Treatment Plan: Benign-appearing, observe. Recommend daily broad spectrum sunscreen SPF 30+ to sun-exposed areas, reapply every 2 hours as needed.  Call for any changes  Return for appointment as scheduled.  Maylene Roes, CMA, am acting as scribe for Armida Sans, MD .  Documentation: I have reviewed the above documentation for accuracy and completeness, and I agree with the above.  Armida Sans, MD

## 2022-12-16 ENCOUNTER — Ambulatory Visit (INDEPENDENT_AMBULATORY_CARE_PROVIDER_SITE_OTHER): Payer: Medicare Other | Admitting: Family Medicine

## 2022-12-16 ENCOUNTER — Other Ambulatory Visit: Payer: Self-pay | Admitting: Family Medicine

## 2022-12-16 VITALS — BP 126/84 | HR 85

## 2022-12-16 DIAGNOSIS — E7800 Pure hypercholesterolemia, unspecified: Secondary | ICD-10-CM

## 2022-12-16 DIAGNOSIS — G4710 Hypersomnia, unspecified: Secondary | ICD-10-CM | POA: Diagnosis not present

## 2022-12-16 DIAGNOSIS — F32A Depression, unspecified: Secondary | ICD-10-CM

## 2022-12-16 DIAGNOSIS — G4739 Other sleep apnea: Secondary | ICD-10-CM

## 2022-12-16 MED ORDER — ARMODAFINIL 150 MG PO TABS: 150.0000 mg | ORAL_TABLET | Freq: Every day | ORAL | 2 refills | Status: DC

## 2022-12-16 NOTE — Progress Notes (Signed)
      Established patient visit   Patient: Karen [name]   DOB: 07-01-1976   46 y.o. Female  MRN: [id number] Visit Date: 12/16/2022  Today's healthcare provider: Mila Merry, MD   No chief complaint on file.  Subjective    HPI  She is here today reporting hypersomnia due to sleep apnea, combination central and obstructive.   Medications: Outpatient Medications Prior to Visit  Medication Sig   albuterol (VENTOLIN HFA) 108 (90 Base) MCG/ACT inhaler Inhale 2 puffs into the lungs every 6 (six) hours as needed.   Armodafinil (NUVIGIL) 150 MG tablet Take 1 tablet (150 mg total) by mouth daily.   busPIRone (BUSPAR) 10 MG tablet TAKE 1 TABLET (10 MG TOTAL) BY MOUTH 2 (TWO) TIMES DAILY. TAKE ALONG WITH 15MG  TABLET TWICE A DAY   busPIRone (BUSPAR) 15 MG tablet TAKE 1 TABLET BY MOUTH TWICE A DAY   celecoxib (CELEBREX) 200 MG capsule TAKE 1 CAPSULE BY MOUTH TWICE A DAY   Ciclopirox 8 % KIT Apply 1 application  topically daily.   diclofenac Sodium (VOLTAREN) 1 % GEL APPLY AS NEEDED 3 TIMES A DAY   DULoxetine (CYMBALTA) 60 MG capsule Take 2 capsules (120 mg total) by mouth daily.   fentaNYL (DURAGESIC) 75 MCG/HR Place 1 patch onto the skin every 3 (three) days.   fexofenadine (ALLEGRA) 180 MG tablet Take 180 mg by mouth daily.   gabapentin (NEURONTIN) 800 MG tablet TAKE 1 TABLET BY MOUTH THREE TIMES A DAY   haloperidol (HALDOL) 0.5 MG tablet TAKE 1/2 OF A TABLET (0.25 MG TOTAL) BY MOUTH TWICE A DAY   lurasidone (LATUDA) 80 MG TABS tablet Take 1 tablet (80 mg total) by mouth daily with supper.   mometasone (ELOCON) 0.1 % cream Apply to rash BID up to two weeks.   naloxone (NARCAN) nasal spray 4 mg/0.1 mL Place 1 spray into the nose as needed.   omeprazole (PRILOSEC) 40 MG capsule TAKE 1 CAPSULE (40 MG TOTAL) BY MOUTH 2 (TWO) TIMES DAILY. SCHEDULE OFFICE VISIT   ondansetron (ZOFRAN-ODT) 4 MG disintegrating tablet DISSOLVE 1 TABLET IN MOUTH EVERY 8 HOURS FOR NAUSEA AND VOMITING   QUEtiapine  (SEROQUEL) 25 MG tablet TAKE 1 TABLET BY MOUTH EVERYDAY AT BEDTIME   No facility-administered medications prior to visit.       Objective    BP 126/84 (BP Location: Right Arm, Patient Position: Sitting, Cuff Size: Normal)   Pulse 85    Physical Exam   General appearance: Well developed, well nourished female, cooperative and in no acute distress Head: Normocephalic, without obvious abnormality, atraumatic Respiratory: Respirations even and unlabored, normal respiratory rate Extremities: All extremities are intact.  Skin: Skin color, texture, turgor normal. No rashes seen  Psych: Appropriate mood and affect. Neurologic: Mental status: Alert, oriented to person, place, and time, thought content appropriate.   Assessment & Plan     1. Mixed sleep apnea, Central predominant   2. Hypersomnia  - Armodafinil (NUVIGIL) 150 MG tablet; Take 1 tablet (150 mg total) by mouth daily.  Dispense: 30 tablet; Refill: 2  3. High cholesterol  - Lipid panel - TSH - Direct LDL         Mila Merry, MD  [facility] [phone] (phone) [phone] (fax)  [facility]

## 2022-12-16 NOTE — Telephone Encounter (Unsigned)
Copied from CRM [id number]. Topic: General - Inquiry >> Dec 16, 2022  2:16 PM De Blanch wrote: Reason for QMV:HQION Mayford Knife from [employer] Stated received some Rx that had been requested a while back in October and needs re-done. Rx are to get a new power wheelchair and PT evaluation. Trying to get Rx re-done as it's missing information.  Send two separate New Rx. one for PT evaluation and for the power wheel chair with fresh dates and diagnosis codes   The patient is a few months overdue.  Please advise.

## 2022-12-16 NOTE — Telephone Encounter (Signed)
Please advise 

## 2022-12-19 ENCOUNTER — Encounter (HOSPITAL_COMMUNITY): Payer: Self-pay

## 2022-12-19 DIAGNOSIS — F331 Major depressive disorder, recurrent, moderate: Secondary | ICD-10-CM | POA: Diagnosis not present

## 2022-12-19 DIAGNOSIS — G2569 Other tics of organic origin: Secondary | ICD-10-CM | POA: Diagnosis not present

## 2022-12-19 DIAGNOSIS — F411 Generalized anxiety disorder: Secondary | ICD-10-CM | POA: Diagnosis not present

## 2022-12-21 ENCOUNTER — Encounter: Payer: Self-pay | Admitting: Dermatology

## 2022-12-22 ENCOUNTER — Telehealth: Payer: Self-pay | Admitting: Family Medicine

## 2022-12-22 NOTE — Telephone Encounter (Signed)
CoverMyMeds requesting prior authorizaiton Key: [id number] Name: Rahrig Armodafinil 150mg  Tabets

## 2023-01-01 ENCOUNTER — Other Ambulatory Visit: Payer: Self-pay | Admitting: Gastroenterology

## 2023-01-01 DIAGNOSIS — R12 Heartburn: Secondary | ICD-10-CM

## 2023-01-02 ENCOUNTER — Encounter: Payer: Self-pay | Admitting: Anesthesiology

## 2023-01-02 ENCOUNTER — Encounter
Admission: RE | Admit: 2023-01-02 | Discharge: 2023-01-02 | Disposition: A | Discharge status: Home / Self Care | Payer: Medicare Other | Source: Ambulatory Visit | Attending: Family Medicine | Admitting: Family Medicine

## 2023-01-02 ENCOUNTER — Ambulatory Visit: Payer: Self-pay | Admitting: Anesthesiology

## 2023-01-02 ENCOUNTER — Other Ambulatory Visit: Payer: Self-pay | Admitting: Psychiatry

## 2023-01-02 DIAGNOSIS — F333 Major depressive disorder, recurrent, severe with psychotic symptoms: Secondary | ICD-10-CM | POA: Diagnosis not present

## 2023-01-02 DIAGNOSIS — R200 Anesthesia of skin: Secondary | ICD-10-CM | POA: Insufficient documentation

## 2023-01-02 DIAGNOSIS — R002 Palpitations: Secondary | ICD-10-CM | POA: Insufficient documentation

## 2023-01-02 DIAGNOSIS — R569 Unspecified convulsions: Secondary | ICD-10-CM | POA: Diagnosis not present

## 2023-01-02 DIAGNOSIS — E785 Hyperlipidemia, unspecified: Secondary | ICD-10-CM | POA: Diagnosis not present

## 2023-01-02 MED ORDER — LABETALOL HCL 5 MG/ML IV SOLN
INTRAVENOUS | Status: AC
  Filled 2023-01-02: qty 4

## 2023-01-02 MED ORDER — KETAMINE HCL 50 MG/5ML IJ SOSY
PREFILLED_SYRINGE | INTRAMUSCULAR | Status: AC
  Filled 2023-01-02: qty 5

## 2023-01-02 MED ORDER — ACETAMINOPHEN 10 MG/ML IV SOLN: 1000.0000 mg | Freq: Once | INTRAVENOUS | Status: DC | PRN

## 2023-01-02 MED ORDER — OXYCODONE HCL 5 MG/5ML PO SOLN: 5.0000 mg | Freq: Once | ORAL | Status: DC | PRN

## 2023-01-02 MED ORDER — FENTANYL CITRATE (PF) 100 MCG/2ML IJ SOLN: 25.0000 ug | INTRAMUSCULAR | Status: DC | PRN

## 2023-01-02 MED ORDER — DROPERIDOL 2.5 MG/ML IJ SOLN: 0.6250 mg | Freq: Once | INTRAMUSCULAR | Status: DC | PRN

## 2023-01-02 MED ORDER — MIDAZOLAM HCL 2 MG/2ML IJ SOLN
INTRAMUSCULAR | Status: AC
  Filled 2023-01-02: qty 2

## 2023-01-02 MED ORDER — PROPOFOL 10 MG/ML IV BOLUS
INTRAVENOUS | Status: DC | PRN
  Administered 2023-01-02: 40 mg via INTRAVENOUS

## 2023-01-02 MED ORDER — KETAMINE HCL 10 MG/ML IJ SOLN
INTRAMUSCULAR | Status: DC | PRN
  Administered 2023-01-02: 40 mg via INTRAVENOUS

## 2023-01-02 MED ORDER — PROMETHAZINE HCL 25 MG/ML IJ SOLN: 6.2500 mg | INTRAMUSCULAR | Status: DC | PRN

## 2023-01-02 MED ORDER — SUCCINYLCHOLINE CHLORIDE 200 MG/10ML IV SOSY
PREFILLED_SYRINGE | INTRAVENOUS | Status: DC | PRN
  Administered 2023-01-02: 80 mg via INTRAVENOUS

## 2023-01-02 MED ORDER — OXYCODONE HCL 5 MG PO TABS: 5.0000 mg | ORAL_TABLET | Freq: Once | ORAL | Status: DC | PRN

## 2023-01-02 MED ORDER — LABETALOL HCL 5 MG/ML IV SOLN
INTRAVENOUS | Status: DC | PRN
  Administered 2023-01-02: 5 mg via INTRAVENOUS

## 2023-01-02 MED ORDER — PROPOFOL 10 MG/ML IV BOLUS
INTRAVENOUS | Status: AC
  Filled 2023-01-02: qty 20

## 2023-01-02 MED ORDER — MIDAZOLAM HCL 5 MG/5ML IJ SOLN
INTRAMUSCULAR | Status: DC | PRN
  Administered 2023-01-02: 2 mg via INTRAVENOUS

## 2023-01-02 MED ORDER — SUCCINYLCHOLINE CHLORIDE 200 MG/10ML IV SOSY
PREFILLED_SYRINGE | INTRAVENOUS | Status: AC
  Filled 2023-01-02: qty 10

## 2023-01-02 NOTE — H&P (Signed)
Karen [name] is an 47 y.o. female.   Chief Complaint: stable . No new mood symptoms HPI: recurrent psychotic depression  Past Medical History:  Diagnosis Date   Ehlers-Danlos syndrome    Fibromyalgia    Headache    Polyp of descending colon    Sleep apnea    Tics of organic origin    Transient alteration of awareness     Past Surgical History:  Procedure Laterality Date   COLONOSCOPY WITH PROPOFOL N/A 02/13/2018   Procedure: COLONOSCOPY WITH PROPOFOL;  Surgeon: Midge Minium, MD;  Location: [facility] ENDOSCOPY;  Service: Endoscopy;  Laterality: N/A;   COLONOSCOPY WITH PROPOFOL N/A 01/11/2022   Procedure: COLONOSCOPY WITH PROPOFOL;  Surgeon: Midge Minium, MD;  Location: [facility] END[facility]COPY;  Service: Endoscopy;  Laterality: N/A;   DILATION AND CURETTAGE OF UTERUS  08/16/2007   ESOPHAGOGASTRODUODENOSCOPY (EGD) WITH PROPOFOL  02/13/2018   Procedure: ESOPHAGOGASTRODUODENOSCOPY (EGD) WITH PROPOFOL;  Surgeon: Midge Minium, MD;  Location: [facility] ENDOSCOPY;  Service: Endoscopy;;   EYE SURGERY Left 08/15/1988   3 Surgeries on left eye to correct crossed eye   LAPAROSCOPY Left 08/15/2001   Fallopian Tube   MANDIBLE SURGERY  08/15/1996   OTHER SURGICAL HISTORY Right 08/15/1985   3 Surgeries to repair broken arm   OTHER SURGICAL HISTORY Left    3 surgeries on her left thigh as an infant   OTHER SURGICAL HISTORY  08/15/1996   Jaw surgery   Right arm surgery  08/15/1985   x 3 due to fracture   TOENAIL EXCISION Bilateral 06/13/2019   Procedure: BILATERAL SECOND TOE PARTIAL NAIL ABLATION;  Surgeon: Terance Hart, MD;  Location: [facility];  Service: Orthopedics;  Laterality: Bilateral;  SURGERY REQUEST TIME 1 HOUR   TONSILLECTOMY  12/13/2002   TONSILLECTOMY      Family History  Problem Relation Age of Onset   Depression Mother    Stroke Mother    Fibromyalgia Mother    Osteoarthritis Mother    Asthma Brother    Depression Brother    Migraines Brother    Asperger's  syndrome Brother    Other Brother        Ehlers-Danlos Syndrome   Cancer Maternal Aunt    Asperger's syndrome Maternal Aunt    Breast cancer Maternal Aunt    Heart disease Paternal Aunt    Heart disease Paternal Uncle    Cervical cancer Maternal Grandmother    Osteoporosis Maternal Grandmother        Died at 4   Osteoarthritis Maternal Grandmother    Colon cancer Maternal Grandfather    Asthma Maternal Grandfather    Asperger's syndrome Maternal Grandfather    Emphysema Maternal Grandfather        Died at 31   Prostate cancer Maternal Grandfather    Heart attack Paternal Grandfather        Died at 45   Asthma Paternal Grandfather    Tuberculosis Paternal Grandfather    Cancer Cousin    Asperger's syndrome Cousin    Asperger's syndrome Other    Heart Problems Paternal Grandmother        Died at 40   Ehlers-Danlos syndrome Father    Ehlers-Danlos syndrome Brother    Social History:  reports that she has never smoked. She has never used smokeless tobacco. She reports that she does not drink alcohol and does not use drugs.  Allergies:  Allergies  Allergen Reactions   Augmentin  [Amoxicillin-Pot Clavulanate] Diarrhea   Chocolate Flavor  Other reaction(s): Other (See Comments) Wheezing, acne   Demerol [Meperidine] Other (See Comments)    Slow to wake up when this drug is given.    Keflex [Cephalexin] Nausea And Vomiting   Morphine And Codeine Nausea And Vomiting   Tape Dermatitis    Must use paper tape only   Toradol [Ketorolac Tromethamine] Nausea And Vomiting   Amoxicillin     Other reaction(s): Unknown   Chocolate     GI distress   Nsaids     patient develops ulcers Other reaction(s): Other (See Comments) patient develops ulcers   Strawberry Extract     GI distress    (Not in a hospital admission)   No results found for this or any previous visit (from the past 48 hour(s)). No results found.  Review of Systems  Constitutional: Negative.   HENT:  Negative.    Eyes: Negative.   Respiratory: Negative.    Cardiovascular: Negative.   Gastrointestinal: Negative.   Musculoskeletal: Negative.   Skin: Negative.   Neurological: Negative.   Psychiatric/Behavioral: Negative.    All other systems reviewed and are negative.   Blood pressure 121/88, pulse 79, temperature 98.1 F (36.7 C), temperature source Oral, resp. rate 18, SpO2 98 %. Physical Exam Vitals and nursing note reviewed.  Constitutional:      Appearance: She is well-developed.  HENT:     Head: Normocephalic and atraumatic.  Eyes:     Conjunctiva/sclera: Conjunctivae normal.     Pupils: Pupils are equal, round, and reactive to light.  Cardiovascular:     Heart sounds: Normal heart sounds.  Pulmonary:     Effort: Pulmonary effort is normal.  Abdominal:     Palpations: Abdomen is soft.  Musculoskeletal:        General: Normal range of motion.     Cervical back: Normal range of motion.  Skin:    General: Skin is warm and dry.  Neurological:     General: No focal deficit present.     Mental Status: She is alert.  Psychiatric:        Mood and Affect: Mood normal.        Thought Content: Thought content normal.      Assessment/Plan Continue maintenance 3-4 week  Mordecai Rasmussen, MD 01/02/2023, 12:05 PM

## 2023-01-02 NOTE — Procedures (Signed)
ECT SERVICES Physician's Interval Evaluation & Treatment Note  Patient Identification: Karen [name] MRN:  [id number] Date of Evaluation:  01/02/2023 TX #: [id number]  MADRS:   MMSE:   P.E. Findings:  No change to physical exam  Psychiatric Interval Note:  Mood good no psychosis  Subjective:  Patient is a 47 y.o. female seen for evaluation for Electroconvulsive Therapy. No complaints  Treatment Summary:   []   Right Unilateral             [x]  Bilateral   % Energy : 1.0 ms 40%   Impedance: 1730 ohms  Seizure Energy Index: 6650 V squared  Postictal Suppression Index: No reading  Seizure Concordance Index: 96%  Medications  Pre Shock: Zofran 4 mg labetalol 20 mg propofol 40 mg ketamine 40 mg succinylcholine 80 mg  Post Shock: Versed 2 mg  Seizure Duration: 22 seconds EMG 64 seconds EEG   Comments: Well-tolerated we will see her back in about 3 or 4 weeks.  Lungs:  [x]   Clear to auscultation               []  Other:   Heart:    [x]   Regular rhythm             []  irregular rhythm    [x]   Previous H&P reviewed, patient examined and there are NO CHANGES                 []   Previous H&P reviewed, patient examined and there are changes noted.   Mordecai Rasmussen, MD 5/20/20243:36 PM

## 2023-01-02 NOTE — Anesthesia Preprocedure Evaluation (Signed)
Anesthesia Evaluation  Patient identified by MRN, date of birth, ID band Patient awake    Reviewed: Allergy & Precautions, H&P , NPO status , Patient's Chart, lab work & pertinent test results, reviewed documented beta blocker date and time   Airway Mallampati: II   Neck ROM: full    Dental  (+) Poor Dentition   Pulmonary neg pulmonary ROS   Pulmonary exam normal        Cardiovascular negative cardio ROS Normal cardiovascular exam Rhythm:regular Rate:Normal     Neuro/Psych  Headaches, Seizures -,  PSYCHIATRIC DISORDERS Anxiety Depression     Neuromuscular disease    GI/Hepatic negative GI ROS, Neg liver ROS,,,  Endo/Other  negative endocrine ROS    Renal/GU negative Renal ROS  negative genitourinary   Musculoskeletal   Abdominal   Peds  Hematology negative hematology ROS (+)   Anesthesia Other Findings Past Medical History: No date: Ehlers-Danlos syndrome No date: Fibromyalgia No date: Headache No date: Polyp of descending colon No date: Sleep apnea No date: Tics of organic origin No date: Transient alteration of awareness Past Surgical History: 02/13/2018: COLONOSCOPY WITH PROPOFOL; N/A     Comment:  Procedure: COLONOSCOPY WITH PROPOFOL;  Surgeon: Midge Minium, MD;  Location: [facility] ENDOSCOPY;  Service:               Endoscopy;  Laterality: N/A; 01/11/2022: COLONOSCOPY WITH PROPOFOL; N/A     Comment:  Procedure: COLONOSCOPY WITH PROPOFOL;  Surgeon: Midge Minium, MD;  Location: [facility] END[facility];  Service:               Endoscopy;  Laterality: N/A; 08/16/2007: DILATION AND CURETTAGE OF UTERUS 02/13/2018: ESOPHAGOGASTRODUODENOSCOPY (EGD) WITH PROPOFOL     Comment:  Procedure: ESOPHAGOGASTRODUODENOSCOPY (EGD) WITH               PROPOFOL;  Surgeon: Midge Minium, MD;  Location: [facility]               ENDOSCOPY;  Service: Endoscopy;; 08/15/1988: EYE SURGERY; Left     Comment:  3 Surgeries on  left eye to correct crossed eye 08/15/2001: LAPAROSCOPY; Left     Comment:  Fallopian Tube 08/15/1996: MANDIBLE SURGERY 08/15/1985: OTHER SURGICAL HISTORY; Right     Comment:  3 Surgeries to repair broken arm No date: OTHER SURGICAL HISTORY; Left     Comment:  3 surgeries on her left thigh as an infant 08/15/1996: OTHER SURGICAL HISTORY     Comment:  Jaw surgery 08/15/1985: Right arm surgery     Comment:  x 3 due to fracture 06/13/2019: TOENAIL EXCISION; Bilateral     Comment:  Procedure: BILATERAL SECOND TOE PARTIAL NAIL ABLATION;                Surgeon: Terance Hart, MD;  Location: [facility];  Service: Orthopedics;  Laterality:               Bilateral;  SURGERY REQUEST TIME 1 HOUR 12/13/2002: TONSILLECTOMY No date: TONSILLECTOMY   Reproductive/Obstetrics negative OB ROS                             Anesthesia Physical Anesthesia Plan  ASA: 3  Anesthesia Plan: General  Post-op Pain Management:    Induction:   PONV Risk Score and Plan:   Airway Management Planned:   Additional Equipment:   Intra-op Plan:   Post-operative Plan:   Informed Consent: I have reviewed the patients History and Physical, chart, labs and discussed the procedure including the risks, benefits and alternatives for the proposed anesthesia with the patient or authorized representative who has indicated his/her understanding and acceptance.     Dental Advisory Given  Plan Discussed with: CRNA  Anesthesia Plan Comments:        Anesthesia Quick Evaluation

## 2023-01-02 NOTE — Transfer of Care (Signed)
Immediate Anesthesia Transfer of Care Note  Patient: Karen [name]  Procedure(s) Performed: ECT TX  Patient Location: PACU  Anesthesia Type:General  Level of Consciousness: drowsy  Airway & Oxygen Therapy: Patient Spontanous Breathing and Patient connected to face mask oxygen  Post-op Assessment: Report given to RN and Post -op Vital signs reviewed and stable  Post vital signs: Reviewed  Last Vitals:  Vitals Value Taken Time  BP 121/79   Temp    Pulse 76   Resp 20 01/02/23 1251  SpO2 97   Vitals shown include unvalidated device data.  Last Pain:  Vitals:   01/02/23 1120  TempSrc:   PainSc: 0-No pain         Complications: No notable events documented.

## 2023-01-03 ENCOUNTER — Other Ambulatory Visit: Payer: Self-pay

## 2023-01-03 ENCOUNTER — Telehealth: Payer: Self-pay | Admitting: Family Medicine

## 2023-01-03 ENCOUNTER — Ambulatory Visit (INDEPENDENT_AMBULATORY_CARE_PROVIDER_SITE_OTHER): Payer: Medicare Other

## 2023-01-03 ENCOUNTER — Telehealth: Payer: Self-pay

## 2023-01-03 VITALS — BP 100/60 | Ht 59.5 in | Wt 135.0 lb

## 2023-01-03 DIAGNOSIS — Z739 Problem related to life management difficulty, unspecified: Secondary | ICD-10-CM

## 2023-01-03 DIAGNOSIS — Z5982 Transportation insecurity: Secondary | ICD-10-CM

## 2023-01-03 DIAGNOSIS — Z0000 Encounter for general adult medical examination without abnormal findings: Secondary | ICD-10-CM

## 2023-01-03 NOTE — Telephone Encounter (Signed)
Copied from CRM [id number]. Topic: General - Other >> Jan 02, 2023  5:05 PM Macon Large wrote: Reason for CRM: Caryn Bee with [employer] stated they need an updated Rx for the powerchair and physical therapy evaluation reflecting pt most recent appt date so they can complete the evaluation. Fax# [phone]  Cb# [phone]

## 2023-01-03 NOTE — Telephone Encounter (Signed)
Patient is asking for refill on Fentanyl 75 mcg patch to be sent into [facility] [address].

## 2023-01-03 NOTE — Telephone Encounter (Signed)
Copied from CRM [id number]. Topic: General - Other >> Jan 03, 2023  9:10 AM Alfred Levins wrote: Reason for CRM: Caryn Bee with [employer] stated PLEASE DISREGARDING PREVIOUS REQUEST (MSG LEFT YESTERDAY) THEY FOUND THE RX.

## 2023-01-03 NOTE — Progress Notes (Signed)
Subjective:   Karen [name] is a 47 y.o. female who presents for Medicare Annual (Subsequent) preventive examination.  Review of Systems    Cardiac Risk Factors include: sedentary lifestyle     Objective:    Today's Vitals   01/03/23 1517  BP: 100/60  Weight: 135 lb (61.2 kg)  Height: 4' 11.5" (1.511 m)  PainSc: 5    Body mass index is 26.81 kg/m.     01/03/2023    3:35 PM 01/11/2022   10:32 AM 12/01/2021    2:52 PM 10/05/2021   12:56 PM 10/04/2021    7:03 PM 06/22/2021   11:42 AM 04/29/2021    4:06 AM  Advanced Directives  Does Patient Have a Medical Advance Directive? No No No Unable to assess, patient is non-responsive or altered mental status Unable to assess, patient is non-responsive or altered mental status No No  Would patient like information on creating a medical advance directive?   No - Patient declined    No - Patient declined    Current Medications (verified) Outpatient Encounter Medications as of 01/03/2023  Medication Sig   albuterol (VENTOLIN HFA) 108 (90 Base) MCG/ACT inhaler Inhale 2 puffs into the lungs every 6 (six) hours as needed.   Armodafinil (NUVIGIL) 150 MG tablet Take 1 tablet (150 mg total) by mouth daily.   busPIRone (BUSPAR) 10 MG tablet TAKE 1 TABLET (10 MG TOTAL) BY MOUTH 2 (TWO) TIMES DAILY. TAKE ALONG WITH 15MG  TABLET TWICE A DAY   busPIRone (BUSPAR) 15 MG tablet TAKE 1 TABLET BY MOUTH TWICE A DAY   celecoxib (CELEBREX) 200 MG capsule TAKE 1 CAPSULE BY MOUTH TWICE A DAY   Ciclopirox 8 % KIT Apply 1 application  topically daily.   diclofenac Sodium (VOLTAREN) 1 % GEL APPLY AS NEEDED 3 TIMES A DAY   DULoxetine (CYMBALTA) 60 MG capsule Take 2 capsules (120 mg total) by mouth daily.   fentaNYL (DURAGESIC) 75 MCG/HR Place 1 patch onto the skin every 3 (three) days.   fexofenadine (ALLEGRA) 180 MG tablet Take 180 mg by mouth daily.   gabapentin (NEURONTIN) 800 MG tablet TAKE 1 TABLET BY MOUTH THREE TIMES A DAY   haloperidol (HALDOL) 0.5 MG  tablet TAKE 1/2 OF A TABLET (0.25 MG TOTAL) BY MOUTH TWICE A DAY   lurasidone (LATUDA) 80 MG TABS tablet Take 1 tablet (80 mg total) by mouth daily with supper.   mometasone (ELOCON) 0.1 % cream Apply to rash BID up to two weeks.   naloxone (NARCAN) nasal spray 4 mg/0.1 mL Place 1 spray into the nose as needed.   omeprazole (PRILOSEC) 40 MG capsule TAKE 1 CAPSULE (40 MG TOTAL) BY MOUTH 2 (TWO) TIMES DAILY. SCHEDULE OFFICE VISIT   ondansetron (ZOFRAN-ODT) 4 MG disintegrating tablet DISSOLVE 1 TABLET IN MOUTH EVERY 8 HOURS FOR NAUSEA AND VOMITING   QUEtiapine (SEROQUEL) 25 MG tablet TAKE 1 TABLET BY MOUTH EVERYDAY AT BEDTIME   No facility-administered encounter medications on file as of 01/03/2023.    Allergies (verified) Augmentin  [amoxicillin-pot clavulanate], Chocolate flavor, Demerol [meperidine], Keflex [cephalexin], Morphine and codeine, Tape, Toradol [ketorolac tromethamine], Amoxicillin, Chocolate, Nsaids, and Strawberry extract   History: Past Medical History:  Diagnosis Date   Ehlers-Danlos syndrome    Fibromyalgia    Headache    Polyp of descending colon    Sleep apnea    Tics of organic origin    Transient alteration of awareness    Past Surgical History:  Procedure Laterality Date  COLONOSCOPY WITH PROPOFOL N/A 02/13/2018   Procedure: COLONOSCOPY WITH PROPOFOL;  Surgeon: Midge Minium, MD;  Location: [facility] ENDOSCOPY;  Service: Endoscopy;  Laterality: N/A;   COLONOSCOPY WITH PROPOFOL N/A 01/11/2022   Procedure: COLONOSCOPY WITH PROPOFOL;  Surgeon: Midge Minium, MD;  Location: [facility] ENDOSCOPY;  Service: Endoscopy;  Laterality: N/A;   DILATION AND CURETTAGE OF UTERUS  08/16/2007   ESOPHAGOGASTRODUODENOSCOPY (EGD) WITH PROPOFOL  02/13/2018   Procedure: ESOPHAGOGASTRODUODENOSCOPY (EGD) WITH PROPOFOL;  Surgeon: Midge Minium, MD;  Location: [facility] ENDOSCOPY;  Service: Endoscopy;;   EYE SURGERY Left 08/15/1988   3 Surgeries on left eye to correct crossed eye   LAPAROSCOPY Left  08/15/2001   Fallopian Tube   MANDIBLE SURGERY  08/15/1996   OTHER SURGICAL HISTORY Right 08/15/1985   3 Surgeries to repair broken arm   OTHER SURGICAL HISTORY Left    3 surgeries on her left thigh as an infant   OTHER SURGICAL HISTORY  08/15/1996   Jaw surgery   Right arm surgery  08/15/1985   x 3 due to fracture   TOENAIL EXCISION Bilateral 06/13/2019   Procedure: BILATERAL SECOND TOE PARTIAL NAIL ABLATION;  Surgeon: Terance Hart, MD;  Location: [facility];  Service: Orthopedics;  Laterality: Bilateral;  SURGERY REQUEST TIME 1 HOUR   TONSILLECTOMY  12/13/2002   TONSILLECTOMY     Family History  Problem Relation Age of Onset   Depression Mother    Stroke Mother    Fibromyalgia Mother    Osteoarthritis Mother    Asthma Brother    Depression Brother    Migraines Brother    Asperger's syndrome Brother    Other Brother        Ehlers-Danlos Syndrome   Cancer Maternal Aunt    Asperger's syndrome Maternal Aunt    Breast cancer Maternal Aunt    Heart disease Paternal Aunt    Heart disease Paternal Uncle    Cervical cancer Maternal Grandmother    Osteoporosis Maternal Grandmother        Died at 59   Osteoarthritis Maternal Grandmother    Colon cancer Maternal Grandfather    Asthma Maternal Grandfather    Asperger's syndrome Maternal Grandfather    Emphysema Maternal Grandfather        Died at 39   Prostate cancer Maternal Grandfather    Heart attack Paternal Grandfather        Died at 54   Asthma Paternal Grandfather    Tuberculosis Paternal Grandfather    Cancer Cousin    Asperger's syndrome Cousin    Asperger's syndrome Other    Heart Problems Paternal Grandmother        Died at 38   Ehlers-Danlos syndrome Father    Ehlers-Danlos syndrome Brother    Social History   Socioeconomic History   Marital status: Single    Spouse name: Not on file   Number of children: 0   Years of education: Not on file   Highest education level: Bachelor's  degree (e.g., BA, AB, BS)  Occupational History   Not on file  Tobacco Use   Smoking status: Never   Smokeless tobacco: Never  Vaping Use   Vaping Use: Never used  Substance and Sexual Activity   Alcohol use: No    Alcohol/week: 0.0 standard drinks of alcohol   Drug use: No   Sexual activity: Never  Other Topics Concern   Not on file  Social History Narrative   Natilynn is 33.   Quinnlyn has a  Bachelors in music.    She lives with her mother.    She enjoys reading, watching TV, writing, and painting.    Social Determinants of Health   Financial Resource Strain: Low Risk  (01/03/2023)   Overall Financial Resource Strain (CARDIA)    Difficulty of Paying Living Expenses: Not hard at all  Food Insecurity: No Food Insecurity (12/01/2021)   Hunger Vital Sign    Worried About Running Out of Food in the Last Year: Never true    Ran Out of Food in the Last Year: Never true  Transportation Needs: No Transportation Needs (01/03/2023)   PRAPARE - Administrator, Civil Service (Medical): No    Lack of Transportation (Non-Medical): No  Physical Activity: Insufficiently Active (12/01/2021)   Exercise Vital Sign    Days of Exercise per Week: 2 days    Minutes of Exercise per Session: 30 min  Stress: No Stress Concern Present (01/03/2023)   [employer] of Occupational Health - Occupational Stress Questionnaire    Feeling of Stress : Only a little  Social Connections: Socially Isolated (01/03/2023)   Social Connection and Isolation Panel [NHANES]    Frequency of Communication with Friends and Family: Once a week    Frequency of Social Gatherings with Friends and Family: Never    Attends Religious Services: Never    Diplomatic Services operational officer: No    Attends Engineer, structural: Never    Marital Status: Never married    Tobacco Counseling Counseling given: Not Answered  Clinical Intake:  Pre-visit preparation completed: Yes  Pain : 0-10 Pain  Score: 5  Pain Location: Generalized Pain Descriptors / Indicators: Aching Pain Onset: More than a month ago Pain Frequency: Constant Pain Relieving Factors: medication and resting  Pain Relieving Factors: medication and resting  BMI - recorded: 26.81 Nutritional Status: BMI 25 -29 Overweight Nutritional Risks: Nausea/ vomitting/ diarrhea (nausea a couple times a week::has Zofran) Diabetes: No  How often do you need to have someone help you when you read instructions, pamphlets, or other written materials from your doctor or pharmacy?: 1 - Never  Diabetic?no  Interpreter Needed?: No  Comments: lives with her mom Information entered by :: B.Giovany Cosby,LPN   Activities of Daily Living    01/03/2023    3:35 PM 10/31/2022    3:44 PM  In your present state of health, do you have any difficulty performing the following activities:  Hearing? 0 0  Vision? 0 1  Difficulty concentrating or making decisions? 1 1  Comment remembering things   Walking or climbing stairs? 1 1  Dressing or bathing? 1 1  Doing errands, shopping? 1 1  Preparing Food and eating ? Y   Using the Toilet? Y   In the past six months, have you accidently leaked urine? N   Do you have problems with loss of bowel control? N   Managing your Medications? Y   Managing your Finances? Y   Housekeeping or managing your Housekeeping? Y     Patient Care Team: Malva Limes, MD as PCP - General (Family Medicine) Sharene Skeans Deanna Artis, MD (Inactive) as Consulting Physician (Pediatric Neurology) Elveria Rising, NP as Nurse Practitioner (Neurology)  Indicate any recent Medical Services you may have received from other than [facility] providers in the past year (date may be approximate).     Assessment:   This is a routine wellness examination for Krystn.  Hearing/Vision screen Hearing Screening - Comments:: Adequate hearing:little loss  Vision Screening - Comments:: Adequate vision w/glasses  Dietary issues and  exercise activities discussed: Current Exercise Habits: The patient does not participate in regular exercise at present, Exercise limited by: neurologic condition(s);orthopedic condition(s);psychological condition(s)   Goals Addressed   None    Depression Screen    01/03/2023    3:24 PM 10/31/2022    3:44 PM 05/10/2022    2:18 PM 01/17/2022    3:32 PM 12/07/2021    2:25 PM 12/01/2021    2:47 PM 11/25/2021   10:00 AM  PHQ 2/9 Scores  PHQ - 2 Score 1 2 2  6 2    PHQ- 9 Score  12 10  21 8       Information is confidential and restricted. Go to Review Flowsheets to unlock data.    Fall Risk    01/03/2023    3:22 PM 12/16/2022    1:43 PM 10/31/2022    3:44 PM 05/10/2022    2:18 PM 12/01/2021    2:53 PM  Fall Risk   Falls in the past year? 1 1 1 1 1   Number falls in past yr: 1 1 1 1 1   Comment 10 falls this year      Injury with Fall? 0 0 1 1 0  Risk for fall due to : History of fall(s) History of fall(s) History of fall(s)  History of fall(s);Impaired balance/gait;Impaired mobility  Follow up Education provided;Falls prevention discussed Falls evaluation completed Falls evaluation completed  Falls prevention discussed    FALL RISK PREVENTION PERTAINING TO THE HOME:  Any stairs in or around the home? Yes ramp If so, are there any without handrails? Yes  Home free of loose throw rugs in walkways, pet beds, electrical cords, etc? Yes  Adequate lighting in your home to reduce risk of falls? Yes   ASSISTIVE DEVICES UTILIZED TO PREVENT FALLS:  Life alert? No  Use of a cane, walker or w/c? Yes w/c Grab bars in the bathroom? Yes  Shower chair or bench in shower? Yes  Elevated toilet seat or a handicapped toilet? No   TIMED UP AND GO:  Was the test performed? No . Pt in w/c   Cognitive Function:    04/20/2022   12:08 PM  MMSE - Mini Mental State Exam  Orientation to time   Orientation to Place   Registration   Attention/ Calculation   Recall   Language- name 2 objects    Language- repeat   Language- follow 3 step command   Language- read & follow direction   Write a sentence   Copy design   Total score      Information is confidential and restricted. Go to Review Flowsheets to unlock data.        01/03/2023    3:37 PM  6CIT Screen  What Year? 0 points  What month? 0 points  What time? 0 points  Count back from 20 0 points  Months in reverse 0 points  Repeat phrase 0 points  Total Score 0 points    Immunizations Immunization History  Administered Date(s) Administered   Covid-19, Mrna,Vaccine(Spikevax)25yrs and older 05/19/2022   H1N1 06/03/2008   HPV 9-valent 07/19/2018   Influenza Split 06/14/2012   Influenza,inj,Quad PF,6+ Mos 10/01/2018, 05/26/2020, 07/19/2021, 05/10/2022   Influenza-Unspecified 05/24/2017, 05/16/2019   Moderna Covid-19 Vaccine Bivalent Booster 6yrs & up 07/19/2021   PFIZER(Purple Top)SARS-COV-2 Vaccination 11/05/2019, 11/26/2019, 05/26/2020   Pneumococcal Polysaccharide-23 05/24/2017   Tdap 06/14/2012    TDAP status: Up to date  Flu Vaccine status: Up to date  Pneumococcal vaccine status: Up to date  Covid-19 vaccine status: Completed vaccines  Qualifies for Shingles Vaccine? No     Screening Tests Health Maintenance  Topic Date Due   Hepatitis C Screening  Never done   HPV VACCINES (2 - Risk 3-dose SCDM series) 08/16/2018   PAP SMEAR-Modifier  07/19/2021   DTaP/Tdap/Td (2 - Td or Tdap) 06/14/2022   COVID-19 Vaccine (6 - 2023-24 season) 05/15/2023 (Originally 07/14/2022)   INFLUENZA VACCINE  03/16/2023   Medicare Annual Wellness (AWV)  01/03/2024   COLONOSCOPY (Pts 45-73yrs Insurance coverage will need to be confirmed)  01/12/2027   HIV Screening  Completed    Health Maintenance  Health Maintenance Due  Topic Date Due   Hepatitis C Screening  Never done   HPV VACCINES (2 - Risk 3-dose SCDM series) 08/16/2018   PAP SMEAR-Modifier  07/19/2021   DTaP/Tdap/Td (2 - Td or Tdap) 06/14/2022     Colorectal cancer screening: Type of screening: Colonoscopy. Completed yes. Repeat every 3 years  Mammogram status: Completed yes. Repeat every year   Lung Cancer Screening: (Low Dose CT Chest recommended if Age 56-80 years, 30 pack-year currently smoking OR have quit w/in 15years.) does not qualify.   Lung Cancer Screening Referral: no  Additional Screening:  Hepatitis C Screening: does not qualify; Completed yes  Vision Screening: Recommended annual ophthalmology exams for early detection of glaucoma and other disorders of the eye. Is the patient up to date with their annual eye exam?  Yes  Who is the provider or what is the name of the office in which the patient attends annual eye exams? Pt does not know If pt is not established with a provider, would they like to be referred to a provider to establish care? No .   Dental Screening: Recommended annual dental exams for proper oral hygiene  Community Resource Referral / Chronic Care Management: CRR required this visit?  Yes   CCM required this visit?  Yes     Plan:     I have personally reviewed and noted the following in the patient's chart:   Medical and social history Use of alcohol, tobacco or illicit drugs  Current medications and supplements including opioid prescriptions. Patient is currently taking opioid prescriptions. Information provided to patient regarding non-opioid alternatives. Patient advised to discuss non-opioid treatment plan with their provider. Functional ability and status Nutritional status Physical activity Advanced directives List of other physicians Hospitalizations, surgeries, and ER visits in previous 12 months Vitals Screenings to include cognitive, depression, and falls Referrals and appointments  In addition, I have reviewed and discussed with patient certain preventive protocols, quality metrics, and best practice recommendations. A written personalized care plan for preventive  services as well as general preventive health recommendations were provided to patient.    Sue Lush, LPN   09/02/1476   Nurse Notes: pt presents via wheelchair, who says her mother brought her her to visit. Pt pleasant and able to answer most of the questions about herself. Unable to answer financial and outside care questions. Pt relays that her mother is scheduled for foot surgery and unable to assist in her care.Pt in need of pharmacy that delivers, transportation, assess to food.  *referral to CCM and CRR made

## 2023-01-03 NOTE — Patient Instructions (Signed)
Karen [name] , Thank you for taking time to come for your Medicare Wellness Visit. I appreciate your ongoing commitment to your health goals. Please review the following plan we discussed and let me know if I can assist you in the future.   These are the goals we discussed:  Goals      DIET - EAT MORE FRUITS AND VEGETABLES     Find Help in My Community     Timeframe:  Long-Range Goal Priority:  Medium Start Date:  08/17/21                           Expected End Date:  11/16/21                Follow Up Date 11/16/21   - begin a notebook of services in my neighborhood or community - follow-up on any referrals for help I am given - think ahead to make sure my need does not become an emergency - have a back-up plan  -Continue follow up with [facility] on 11/25/21   Why is this important?   Knowing how and where to find help for yourself or family in your neighborhood and community is an important skill.  You will want to take some steps to learn how.    Notes:         This is a list of the screening recommended for you and due dates:  Health Maintenance  Topic Date Due   Hepatitis C Screening: USPSTF Recommendation to screen - Ages 53-79 yo.  Never done   HPV Vaccine (2 - Risk 3-dose SCDM series) 08/16/2018   Pap Smear  07/19/2021   DTaP/Tdap/Td vaccine (2 - Td or Tdap) 06/14/2022   COVID-19 Vaccine (6 - 2023-24 season) 05/15/2023*   Flu Shot  03/16/2023   Medicare Annual Wellness Visit  01/03/2024   Colon Cancer Screening  01/12/2027   HIV Screening  Completed  *Topic was postponed. The date shown is not the original due date.    Advanced directives: yes  Conditions/risks identified: high falls risk  Next appointment: Follow up in one year for your annual wellness visit. 01/10/2024 @ 3:30pm in person  Preventive Care 40-64 Years, Female Preventive care refers to lifestyle choices and visits with your health care provider that can promote health and  wellness. What does preventive care include? A yearly physical exam. This is also called an annual well check. Dental exams once or twice a year. Routine eye exams. Ask your health care provider how often you should have your eyes checked. Personal lifestyle choices, including: Daily care of your teeth and gums. Regular physical activity. Eating a healthy diet. Avoiding tobacco and drug use. Limiting alcohol use. Practicing safe sex. Taking low-dose aspirin daily starting at age 45. Taking vitamin and mineral supplements as recommended by your health care provider. What happens during an annual well check? The services and screenings done by your health care provider during your annual well check will depend on your age, overall health, lifestyle risk factors, and family history of disease. Counseling  Your health care provider may ask you questions about your: Alcohol use. Tobacco use. Drug use. Emotional well-being. Home and relationship well-being. Sexual activity. Eating habits. Work and work Astronomer. Method of birth control. Menstrual cycle. Pregnancy history. Screening  You may have the following tests or measurements: Height, weight, and BMI. Blood pressure. Lipid and cholesterol levels. These may be checked every  5 years, or more frequently if you are over 19 years old. Skin check. Lung cancer screening. You may have this screening every year starting at age 79 if you have a 30-pack-year history of smoking and currently smoke or have quit within the past 15 years. Fecal occult blood test (FOBT) of the stool. You may have this test every year starting at age 51. Flexible sigmoidoscopy or colonoscopy. You may have a sigmoidoscopy every 5 years or a colonoscopy every 10 years starting at age 56. Hepatitis C blood test. Hepatitis B blood test. Sexually transmitted disease (STD) testing. Diabetes screening. This is done by checking your blood sugar (glucose) after you  have not eaten for a while (fasting). You may have this done every 1-3 years. Mammogram. This may be done every 1-2 years. Talk to your health care provider about when you should start having regular mammograms. This may depend on whether you have a family history of breast cancer. BRCA-related cancer screening. This may be done if you have a family history of breast, ovarian, tubal, or peritoneal cancers. Pelvic exam and Pap test. This may be done every 3 years starting at age 30. Starting at age 27, this may be done every 5 years if you have a Pap test in combination with an HPV test. Bone density scan. This is done to screen for osteoporosis. You may have this scan if you are at high risk for osteoporosis. Discuss your test results, treatment options, and if necessary, the need for more tests with your health care provider. Vaccines  Your health care provider may recommend certain vaccines, such as: Influenza vaccine. This is recommended every year. Tetanus, diphtheria, and acellular pertussis (Tdap, Td) vaccine. You may need a Td booster every 10 years. Zoster vaccine. You may need this after age 82. Pneumococcal 13-valent conjugate (PCV13) vaccine. You may need this if you have certain conditions and were not previously vaccinated. Pneumococcal polysaccharide (PPSV23) vaccine. You may need one or two doses if you smoke cigarettes or if you have certain conditions. Talk to your health care provider about which screenings and vaccines you need and how often you need them. This information is not intended to replace advice given to you by your health care provider. Make sure you discuss any questions you have with your health care provider. Document Released: 08/28/2015 Document Revised: 04/20/2016 Document Reviewed: 06/02/2015 Elsevier Interactive Patient Education  2017 [employer].    Fall Prevention in the Home Falls can cause injuries. They can happen to people of all ages. There are  many things you can do to make your home safe and to help prevent falls. What can I do on the outside of my home? Regularly fix the edges of walkways and driveways and fix any cracks. Remove anything that might make you trip as you walk through a door, such as a raised step or threshold. Trim any bushes or trees on the path to your home. Use bright outdoor lighting. Clear any walking paths of anything that might make someone trip, such as rocks or tools. Regularly check to see if handrails are loose or broken. Make sure that both sides of any steps have handrails. Any raised decks and porches should have guardrails on the edges. Have any leaves, snow, or ice cleared regularly. Use sand or salt on walking paths during winter. Clean up any spills in your garage right away. This includes oil or grease spills. What can I do in the bathroom? Use night lights.  Install grab bars by the toilet and in the tub and shower. Do not use towel bars as grab bars. Use non-skid mats or decals in the tub or shower. If you need to sit down in the shower, use a plastic, non-slip stool. Keep the floor dry. Clean up any water that spills on the floor as soon as it happens. Remove soap buildup in the tub or shower regularly. Attach bath mats securely with double-sided non-slip rug tape. Do not have throw rugs and other things on the floor that can make you trip. What can I do in the bedroom? Use night lights. Make sure that you have a light by your bed that is easy to reach. Do not use any sheets or blankets that are too big for your bed. They should not hang down onto the floor. Have a firm chair that has side arms. You can use this for support while you get dressed. Do not have throw rugs and other things on the floor that can make you trip. What can I do in the kitchen? Clean up any spills right away. Avoid walking on wet floors. Keep items that you use a lot in easy-to-reach places. If you need to reach  something above you, use a strong step stool that has a grab bar. Keep electrical cords out of the way. Do not use floor polish or wax that makes floors slippery. If you must use wax, use non-skid floor wax. Do not have throw rugs and other things on the floor that can make you trip. What can I do with my stairs? Do not leave any items on the stairs. Make sure that there are handrails on both sides of the stairs and use them. Fix handrails that are broken or loose. Make sure that handrails are as long as the stairways. Check any carpeting to make sure that it is firmly attached to the stairs. Fix any carpet that is loose or worn. Avoid having throw rugs at the top or bottom of the stairs. If you do have throw rugs, attach them to the floor with carpet tape. Make sure that you have a light switch at the top of the stairs and the bottom of the stairs. If you do not have them, ask someone to add them for you. What else can I do to help prevent falls? Wear shoes that: Do not have high heels. Have rubber bottoms. Are comfortable and fit you well. Are closed at the toe. Do not wear sandals. If you use a stepladder: Make sure that it is fully opened. Do not climb a closed stepladder. Make sure that both sides of the stepladder are locked into place. Ask someone to hold it for you, if possible. Clearly mark and make sure that you can see: Any grab bars or handrails. First and last steps. Where the edge of each step is. Use tools that help you move around (mobility aids) if they are needed. These include: Canes. Walkers. Scooters. Crutches. Turn on the lights when you go into a dark area. Replace any light bulbs as soon as they burn out. Set up your furniture so you have a clear path. Avoid moving your furniture around. If any of your floors are uneven, fix them. If there are any pets around you, be aware of where they are. Review your medicines with your doctor. Some medicines can make you  feel dizzy. This can increase your chance of falling. Ask your doctor what other things that you  can do to help prevent falls. This information is not intended to replace advice given to you by your health care provider. Make sure you discuss any questions you have with your health care provider. Document Released: 05/28/2009 Document Revised: 01/07/2016 Document Reviewed: 09/05/2014 Elsevier Interactive Patient Education  2017 [employer].

## 2023-01-03 NOTE — Anesthesia Postprocedure Evaluation (Signed)
Anesthesia Post Note  Patient: MARA BHULLAR  Procedure(s) Performed: ECT TX  Patient location during evaluation: PACU Anesthesia Type: General Level of consciousness: awake and alert Pain management: pain level controlled Vital Signs Assessment: post-procedure vital signs reviewed and stable Respiratory status: spontaneous breathing, nonlabored ventilation, respiratory function stable and patient connected to nasal cannula oxygen Cardiovascular status: blood pressure returned to baseline and stable Postop Assessment: no apparent nausea or vomiting Anesthetic complications: no   No notable events documented.   Last Vitals:  Vitals:   01/02/23 1330 01/02/23 1400  BP: 123/84 120/82  Pulse: 79 79  Resp: 17 17  Temp: 37.1 C 37.1 C  SpO2: 96%     Last Pain:  Vitals:   01/02/23 1400  TempSrc: Oral  PainSc: 0-No pain                 Yevette Edwards

## 2023-01-04 ENCOUNTER — Telehealth: Payer: Self-pay | Admitting: *Deleted

## 2023-01-04 MED ORDER — FENTANYL 75 MCG/HR TD PT72: 1.0000 | MEDICATED_PATCH | TRANSDERMAL | 0 refills | Status: DC

## 2023-01-04 NOTE — Progress Notes (Signed)
  Care Coordination  Outreach Note  01/04/2023 Name: MONYETTE CLAYBURN MRN: [id number] DOB: 09-Apr-1976   Care Coordination Outreach Attempts: An unsuccessful telephone outreach was attempted today to offer the patient information about available care coordination services.  Follow Up Plan:  Additional outreach attempts will be made to offer the patient care coordination information and services.   Encounter Outcome:  No Answer  Burman Nieves, CCMA Care Coordination Care Guide Direct Dial: [phone]

## 2023-01-05 NOTE — Progress Notes (Signed)
  Care Coordination  Outreach Note  01/05/2023 Name: Karen [name] MRN: [id number] DOB: [birth year]   Care Coordination Outreach Attempts: A second unsuccessful outreach was attempted today to offer the patient with information about available care coordination services.  Follow Up Plan:  Additional outreach attempts will be made to offer the patient care coordination information and services.   Encounter Outcome:  No Answer  Burman Nieves, CCMA Care Coordination Care Guide Direct Dial: [phone]

## 2023-01-06 NOTE — Progress Notes (Signed)
  Care Coordination  Outreach Note  01/06/2023 Name: BENEDICTA REVARD MRN: [id number] DOB: [birth year]   Care Coordination Outreach Attempts: A third unsuccessful outreach was attempted today to offer the patient with information about available care coordination services.  Follow Up Plan:  No further outreach attempts will be made at this time. We have been unable to contact the patient to offer or enroll patient in care coordination services  Encounter Outcome:  No Answer  Burman Nieves, [facility] Care Coordination Care Guide Direct Dial: [phone]

## 2023-01-10 ENCOUNTER — Telehealth: Payer: Self-pay | Admitting: *Deleted

## 2023-01-10 NOTE — Telephone Encounter (Signed)
   Telephone encounter was:  Unsuccessful.  01/10/2023 Name: Karen [name] MRN: [id number] DOB: 24-Feb-1976  Unsuccessful outbound call made today to assist with:  Transportation Needs   Outreach Attempt:  1st Attempt  Voicemail full  Yehuda Mao Greenauer -[facility] , Population Health [phone] [address] [place] , [place] [address] Email : [email or web address]

## 2023-01-10 NOTE — Telephone Encounter (Signed)
   Telephone encounter was:  Successful.  01/10/2023 Name: Karen [name] MRN: [id number] DOB: March 19, 1976  Karen [name] is a 47 y.o. year old female who is a primary care patient of Fisher, Demetrios Isaacs, MD . The community resource team was consulted for assistance with Transportation Needs   Care guide performed the following interventions: Patient provided with information about care guide support team and interviewed to confirm resource needs. Talked with patients mom about transportation options  Follow Up Plan:  No further follow up planned at this time. The patient has been provided with needed resources. Karen Mao Greenauer -[facility], Population Health [phone] [address] [place] , [place] [address] Email : [name]

## 2023-01-12 ENCOUNTER — Telehealth: Payer: Self-pay

## 2023-01-12 NOTE — Progress Notes (Signed)
  Care Management   Outreach Note  01/12/2023 Name: Karen [name] MRN: [id number] DOB: 07-26-1976  An unsuccessful telephone outreach was attempted today to contact the patient about Chronic Care Management needs.    Follow Up Plan:  The care management team will reach out to the patient again over the next 7 days.  If patient returns call to provider office, please advise to call Embedded Care Management Care Guide Penne Lash * at [phone] Lash, RMA Care Guide [facility]  [place], [place] [address] Direct Dial: [phone] [email or web address]

## 2023-01-18 DIAGNOSIS — F331 Major depressive disorder, recurrent, moderate: Secondary | ICD-10-CM | POA: Diagnosis not present

## 2023-01-18 DIAGNOSIS — F411 Generalized anxiety disorder: Secondary | ICD-10-CM | POA: Diagnosis not present

## 2023-01-18 DIAGNOSIS — G2569 Other tics of organic origin: Secondary | ICD-10-CM | POA: Diagnosis not present

## 2023-01-23 ENCOUNTER — Encounter
Admission: RE | Admit: 2023-01-23 | Discharge: 2023-01-23 | Disposition: A | Discharge status: Home / Self Care | Payer: Medicare Other | Source: Ambulatory Visit | Attending: Psychiatry | Admitting: Psychiatry

## 2023-01-23 ENCOUNTER — Other Ambulatory Visit: Payer: Self-pay | Admitting: Psychiatry

## 2023-01-23 ENCOUNTER — Encounter: Payer: Self-pay | Admitting: Registered Nurse

## 2023-01-23 DIAGNOSIS — F323 Major depressive disorder, single episode, severe with psychotic features: Secondary | ICD-10-CM | POA: Insufficient documentation

## 2023-01-23 DIAGNOSIS — R200 Anesthesia of skin: Secondary | ICD-10-CM

## 2023-01-23 DIAGNOSIS — F331 Major depressive disorder, recurrent, moderate: Secondary | ICD-10-CM | POA: Diagnosis not present

## 2023-01-23 DIAGNOSIS — E785 Hyperlipidemia, unspecified: Secondary | ICD-10-CM | POA: Diagnosis not present

## 2023-01-23 MED ORDER — SUCCINYLCHOLINE CHLORIDE 200 MG/10ML IV SOSY
PREFILLED_SYRINGE | INTRAVENOUS | Status: DC | PRN
  Administered 2023-01-23: 80 mg via INTRAVENOUS

## 2023-01-23 MED ORDER — MIDAZOLAM HCL 2 MG/2ML IJ SOLN
INTRAMUSCULAR | Status: AC
  Filled 2023-01-23: qty 2

## 2023-01-23 MED ORDER — ONDANSETRON HCL 4 MG/2ML IJ SOLN: 4.0000 mg | Freq: Once | INTRAMUSCULAR | Status: DC

## 2023-01-23 MED ORDER — KETAMINE HCL 10 MG/ML IJ SOLN
INTRAMUSCULAR | Status: DC | PRN
  Administered 2023-01-23: 40 mg via INTRAVENOUS

## 2023-01-23 MED ORDER — KETOROLAC TROMETHAMINE 30 MG/ML IJ SOLN
INTRAMUSCULAR | Status: DC | PRN
  Administered 2023-01-23: 30 mg via INTRAVENOUS

## 2023-01-23 MED ORDER — LABETALOL HCL 5 MG/ML IV SOLN
INTRAVENOUS | Status: DC | PRN
  Administered 2023-01-23: 25 mg via INTRAVENOUS

## 2023-01-23 MED ORDER — MIDAZOLAM HCL 2 MG/2ML IJ SOLN: 2.0000 mg | Freq: Once | INTRAMUSCULAR | Status: DC

## 2023-01-23 MED ORDER — LABETALOL HCL 5 MG/ML IV SOLN
INTRAVENOUS | Status: AC
  Filled 2023-01-23: qty 4

## 2023-01-23 MED ORDER — KETAMINE HCL 50 MG/5ML IJ SOSY
PREFILLED_SYRINGE | INTRAMUSCULAR | Status: AC
  Filled 2023-01-23: qty 5

## 2023-01-23 MED ORDER — SODIUM CHLORIDE 0.9 % IV SOLN: 500.0000 mL | Freq: Once | INTRAVENOUS | Status: AC

## 2023-01-23 MED ORDER — PROPOFOL 10 MG/ML IV BOLUS
INTRAVENOUS | Status: DC | PRN
  Administered 2023-01-23: 40 mg via INTRAVENOUS

## 2023-01-23 MED ORDER — MIDAZOLAM HCL 2 MG/2ML IJ SOLN
INTRAMUSCULAR | Status: DC | PRN
  Administered 2023-01-23: 2 mg via INTRAVENOUS

## 2023-01-23 NOTE — Anesthesia Procedure Notes (Signed)
Date/Time: 01/23/2023 2:06 PM  Performed by: Stormy Fabian, CRNAPre-anesthesia Checklist: Patient identified, Emergency Drugs available, Suction available and Patient being monitored Patient Re-evaluated:Patient Re-evaluated prior to induction Oxygen Delivery Method: Circle system utilized Preoxygenation: Pre-oxygenation with 100% oxygen Induction Type: IV induction Ventilation: Mask ventilation without difficulty and Mask ventilation throughout procedure Airway Equipment and Method: Bite block Placement Confirmation: positive ETCO2 Dental Injury: Teeth and Oropharynx as per pre-operative assessment

## 2023-01-23 NOTE — Anesthesia Preprocedure Evaluation (Signed)
Anesthesia Evaluation  Patient identified by MRN, date of birth, ID band Patient awake    Reviewed: Allergy & Precautions, H&P , NPO status , Patient's Chart, lab work & pertinent test results, reviewed documented beta blocker date and time   Airway Mallampati: II   Neck ROM: full    Dental  (+) Poor Dentition   Pulmonary neg pulmonary ROS   Pulmonary exam normal        Cardiovascular negative cardio ROS Normal cardiovascular exam Rhythm:regular Rate:Normal     Neuro/Psych  Headaches, Seizures -,  PSYCHIATRIC DISORDERS Anxiety Depression     Neuromuscular disease    GI/Hepatic negative GI ROS, Neg liver ROS,,,  Endo/Other  negative endocrine ROS    Renal/GU negative Renal ROS  negative genitourinary   Musculoskeletal   Abdominal   Peds  Hematology negative hematology ROS (+)   Anesthesia Other Findings Past Medical History: No date: Ehlers-Danlos syndrome No date: Fibromyalgia No date: Headache No date: Polyp of descending colon No date: Sleep apnea No date: Tics of organic origin No date: Transient alteration of awareness Past Surgical History: 02/13/2018: COLONOSCOPY WITH PROPOFOL; N/A     Comment:  Procedure: COLONOSCOPY WITH PROPOFOL;  Surgeon: Wohl,               Darren, MD;  Location: [facility] ENDOSCOPY;  Service:               Endoscopy;  Laterality: N/A; 01/11/2022: COLONOSCOPY WITH PROPOFOL; N/A     Comment:  Procedure: COLONOSCOPY WITH PROPOFOL;  Surgeon: Wohl,               Darren, MD;  Location: [facility] ENDOSCOPY;  Service:               Endoscopy;  Laterality: N/A; 08/16/2007: DILATION AND CURETTAGE OF UTERUS 02/13/2018: ESOPHAGOGASTRODUODENOSCOPY (EGD) WITH PROPOFOL     Comment:  Procedure: ESOPHAGOGASTRODUODENOSCOPY (EGD) WITH               PROPOFOL;  Surgeon: Wohl, Darren, MD;  Location: [facility]               ENDOSCOPY;  Service: Endoscopy;; 08/15/1988: EYE SURGERY; Left     Comment:  3 Surgeries on  left eye to correct crossed eye 08/15/2001: LAPAROSCOPY; Left     Comment:  Fallopian Tube 08/15/1996: MANDIBLE SURGERY 08/15/1985: OTHER SURGICAL HISTORY; Right     Comment:  3 Surgeries to repair broken arm No date: OTHER SURGICAL HISTORY; Left     Comment:  3 surgeries on her left thigh as an infant 08/15/1996: OTHER SURGICAL HISTORY     Comment:  Jaw surgery 08/15/1985: Right arm surgery     Comment:  x 3 due to fracture 06/13/2019: TOENAIL EXCISION; Bilateral     Comment:  Procedure: BILATERAL SECOND TOE PARTIAL NAIL ABLATION;                Surgeon: Adair, Christopher R, MD;  Location: [facility];  Service: Orthopedics;  Laterality:               Bilateral;  SURGERY REQUEST TIME 1 HOUR 12/13/2002: TONSILLECTOMY No date: TONSILLECTOMY   Reproductive/Obstetrics negative OB ROS                             Anesthesia Physical Anesthesia Plan  ASA: 3  Anesthesia Plan: General     Post-op Pain Management:    Induction:   PONV Risk Score and Plan:   Airway Management Planned:   Additional Equipment:   Intra-op Plan:   Post-operative Plan:   Informed Consent: I have reviewed the patients History and Physical, chart, labs and discussed the procedure including the risks, benefits and alternatives for the proposed anesthesia with the patient or authorized representative who has indicated his/her understanding and acceptance.     Dental Advisory Given  Plan Discussed with: CRNA  Anesthesia Plan Comments:        Anesthesia Quick Evaluation  

## 2023-01-23 NOTE — Transfer of Care (Signed)
Immediate Anesthesia Transfer of Care Note  Patient: Karen [name]  Procedure(s) Performed: ECT TX  Patient Location: PACU  Anesthesia Type:General  Level of Consciousness: drowsy  Airway & Oxygen Therapy: Patient Spontanous Breathing and Patient connected to face mask oxygen  Post-op Assessment: Report given to RN and Post -op Vital signs reviewed and stable  Post vital signs: Reviewed and stable  Last Vitals:  Vitals Value Taken Time  BP 147/97 01/23/23 1423  Temp    Pulse 87 01/23/23 1426  Resp 22 01/23/23 1426  SpO2 98 % 01/23/23 1426  Vitals shown include unvalidated device data.  Last Pain:  Vitals:   01/23/23 1143  TempSrc:   PainSc: 0-No pain         Complications: No notable events documented.

## 2023-01-23 NOTE — H&P (Signed)
Karen [name] is an 47 y.o. female.   Chief Complaint: Patient seen and chart reviewed.  Patient has no current chief complaint.  Mood has been good.  No psychotic symptoms HPI: History of severe psychotic depression responded well to ECT currently receiving maintenance  Past Medical History:  Diagnosis Date   Ehlers-Danlos syndrome    Fibromyalgia    Headache    Polyp of descending colon    Sleep apnea    Tics of organic origin    Transient alteration of awareness     Past Surgical History:  Procedure Laterality Date   COLONOSCOPY WITH PROPOFOL N/A 02/13/2018   Procedure: COLONOSCOPY WITH PROPOFOL;  Surgeon: Midge Minium, MD;  Location: [facility] ENDOSCOPY;  Service: Endoscopy;  Laterality: N/A;   COLONOSCOPY WITH PROPOFOL N/A 01/11/2022   Procedure: COLONOSCOPY WITH PROPOFOL;  Surgeon: Midge Minium, MD;  Location: [facility] ENDOSCOPY;  Service: Endoscopy;  Laterality: N/A;   DILATION AND CURETTAGE OF UTERUS  08/16/2007   ESOPHAGOGASTRODUODENOSCOPY (EGD) WITH PROPOFOL  02/13/2018   Procedure: ESOPHAGOGASTRODUODENOSCOPY (EGD) WITH PROPOFOL;  Surgeon: Midge Minium, MD;  Location: [facility] ENDOSCOPY;  Service: Endoscopy;;   EYE SURGERY Left 08/15/1988   3 Surgeries on left eye to correct crossed eye   LAPAROSCOPY Left 08/15/2001   Fallopian Tube   MANDIBLE SURGERY  08/15/1996   OTHER SURGICAL HISTORY Right 08/15/1985   3 Surgeries to repair broken arm   OTHER SURGICAL HISTORY Left    3 surgeries on her left thigh as an infant   OTHER SURGICAL HISTORY  08/15/1996   Jaw surgery   Right arm surgery  08/15/1985   x 3 due to fracture   TOENAIL EXCISION Bilateral 06/13/2019   Procedure: BILATERAL SECOND TOE PARTIAL NAIL ABLATION;  Surgeon: Terance Hart, MD;  Location: [facility];  Service: Orthopedics;  Laterality: Bilateral;  SURGERY REQUEST TIME 1 HOUR   TONSILLECTOMY  12/13/2002   TONSILLECTOMY      Family History  Problem Relation Age of Onset   Depression Mother     Stroke Mother    Fibromyalgia Mother    Osteoarthritis Mother    Asthma Brother    Depression Brother    Migraines Brother    Asperger's syndrome Brother    Other Brother        Ehlers-Danlos Syndrome   Cancer Maternal Aunt    Asperger's syndrome Maternal Aunt    Breast cancer Maternal Aunt    Heart disease Paternal Aunt    Heart disease Paternal Uncle    Cervical cancer Maternal Grandmother    Osteoporosis Maternal Grandmother        Died at 62   Osteoarthritis Maternal Grandmother    Colon cancer Maternal Grandfather    Asthma Maternal Grandfather    Asperger's syndrome Maternal Grandfather    Emphysema Maternal Grandfather        Died at 98   Prostate cancer Maternal Grandfather    Heart attack Paternal Grandfather        Died at 6   Asthma Paternal Grandfather    Tuberculosis Paternal Grandfather    Cancer Cousin    Asperger's syndrome Cousin    Asperger's syndrome Other    Heart Problems Paternal Grandmother        Died at 64   Ehlers-Danlos syndrome Father    Ehlers-Danlos syndrome Brother    Social History:  reports that she has never smoked. She has never used smokeless tobacco. She reports that she does not drink  alcohol and does not use drugs.  Allergies:  Allergies  Allergen Reactions   Augmentin  [Amoxicillin-Pot Clavulanate] Diarrhea   Chocolate Flavor     Other reaction(s): Other (See Comments) Wheezing, acne   Demerol [Meperidine] Other (See Comments)    Slow to wake up when this drug is given.    Keflex [Cephalexin] Nausea And Vomiting   Morphine And Codeine Nausea And Vomiting   Tape Dermatitis    Must use paper tape only   Toradol [Ketorolac Tromethamine] Nausea And Vomiting   Amoxicillin     Other reaction(s): Unknown   Chocolate     GI distress   Nsaids     patient develops ulcers Other reaction(s): Other (See Comments) patient develops ulcers   Strawberry Extract     GI distress    (Not in a hospital admission)   No results  found for this or any previous visit (from the past 48 hour(s)). No results found.  Review of Systems  Constitutional: Negative.   HENT: Negative.    Eyes: Negative.   Respiratory: Negative.    Cardiovascular: Negative.   Gastrointestinal: Negative.   Musculoskeletal: Negative.   Skin: Negative.   Neurological: Negative.   Psychiatric/Behavioral: Negative.      Blood pressure 120/80, pulse 80, temperature 98.3 F (36.8 C), temperature source Oral, resp. rate 18, SpO2 98 %. Physical Exam Vitals and nursing note reviewed.  Constitutional:      Appearance: She is well-developed.  HENT:     Head: Normocephalic and atraumatic.  Eyes:     Conjunctiva/sclera: Conjunctivae normal.     Pupils: Pupils are equal, round, and reactive to light.  Cardiovascular:     Heart sounds: Normal heart sounds.  Pulmonary:     Effort: Pulmonary effort is normal.  Abdominal:     Palpations: Abdomen is soft.  Musculoskeletal:        General: Normal range of motion.     Cervical back: Normal range of motion.  Skin:    General: Skin is warm and dry.  Neurological:     General: No focal deficit present.     Mental Status: She is alert.  Psychiatric:        Mood and Affect: Mood normal.      Assessment/Plan We reviewed the potential benefit of maintenance.  Patient's has been referred to [facility].  Meanwhile today will be her last treatment here as we will be closing the service at the end of the week.  Mordecai Rasmussen, MD 01/23/2023, 5:00 PM

## 2023-01-23 NOTE — Procedures (Signed)
ECT SERVICES Physician's Interval Evaluation & Treatment Note  Patient Identification: Karen [name] MRN:  [id number] Date of Evaluation:  01/23/2023 TX #: [id number]  MADRS:   MMSE:   P.E. Findings:  No change to physical exam  Psychiatric Interval Note:  Stable no complaints no psychosis no depression  Subjective:  Patient is a 47 y.o. female seen for evaluation for Electroconvulsive Therapy. No specific complaint  Treatment Summary:   []   Right Unilateral             [x]  Bilateral   % Energy : 1.0 ms 40%   Impedance: 1000 ohms  Seizure Energy Index: 4828 V squared  Postictal Suppression Index: 77%  Seizure Concordance Index: 92%  Medications  Pre Shock: Zofran 4 mg labetalol 20 mg propofol 40 mg ketamine 40 mg succinylcholine 80 mg  Post Shock: Toradol 30 mg  Seizure Duration: 28 seconds EMG 54 seconds EEG   Comments: Last treatment with [place].  She has been referred to [facility] for potential follow-up maintenance  Lungs:  [x]   Clear to auscultation               []  Other:   Heart:    [x]   Regular rhythm             []  irregular rhythm    [x]   Previous H&P reviewed, patient examined and there are NO CHANGES                 []   Previous H&P reviewed, patient examined and there are changes noted.   Mordecai Rasmussen, MD 6/10/20245:07 PM

## 2023-01-24 NOTE — Anesthesia Postprocedure Evaluation (Signed)
Anesthesia Post Note  Patient: Karen [name]  Procedure(s) Performed: ECT TX  Patient location during evaluation: PACU Anesthesia Type: General Level of consciousness: awake and alert Pain management: pain level controlled Vital Signs Assessment: post-procedure vital signs reviewed and stable Respiratory status: spontaneous breathing, nonlabored ventilation, respiratory function stable and patient connected to nasal cannula oxygen Cardiovascular status: blood pressure returned to baseline and stable Postop Assessment: no apparent nausea or vomiting Anesthetic complications: no   No notable events documented.   Last Vitals:  Vitals:   01/23/23 1450 01/23/23 1514  BP: 128/82 120/80  Pulse: 81 80  Resp: 17 18  Temp: 36.4 C 36.8 C  SpO2: 98%     Last Pain:  Vitals:   01/23/23 1514  TempSrc: Oral  PainSc: 0-No pain                 [name]

## 2023-01-27 ENCOUNTER — Other Ambulatory Visit: Payer: Self-pay | Admitting: Gastroenterology

## 2023-01-27 DIAGNOSIS — R12 Heartburn: Secondary | ICD-10-CM

## 2023-02-03 ENCOUNTER — Other Ambulatory Visit: Payer: Self-pay | Admitting: Family Medicine

## 2023-02-03 MED ORDER — FENTANYL 75 MCG/HR TD PT72: 1.0000 | MEDICATED_PATCH | TRANSDERMAL | 0 refills | Status: DC

## 2023-02-03 NOTE — Telephone Encounter (Signed)
Requested medications are due for refill today.  yes  Requested medications are on the active medications list.  yes  Last refill. 01/04/2023 #10 0 rf  Future visit scheduled.   yes  Notes to clinic.  Refill not delegated.    Requested Prescriptions  Pending Prescriptions Disp Refills   fentaNYL (DURAGESIC) 75 MCG/HR 10 patch 0    Sig: Place 1 patch onto the skin every 3 (three) days.     Not Delegated - Analgesics:  Opioid Agonists Failed - 02/03/2023  1:50 PM      Failed - This refill cannot be delegated      Failed - Urine Drug Screen completed in last 360 days      Passed - Valid encounter within last 3 months    Recent Outpatient Visits           1 month ago Hypersomnia   [facility] Malva Limes, MD   3 months ago Other fatigue   [place] [facility] Malva Limes, MD   7 months ago Ehlers-Danlos syndrome   [facility] Malva Limes, MD   8 months ago Tinea pedis of right foot   [facility] McGuffey, [place], PA-C   8 months ago Need for influenza vaccination   [facility] Malva Limes, MD       Future Appointments             In 4 months Deirdre Evener, MD [facility]

## 2023-02-03 NOTE — Telephone Encounter (Signed)
Medication Refill - Medication: Fentanyl Patches  Has the patient contacted their pharmacy? No. (Agent: If no, request that the patient contact the pharmacy for the refill. If patient does not wish to contact the pharmacy document the reason why and proceed with request.) (Agent: If yes, when and what did the pharmacy advise?)  Preferred Pharmacy (with phone number [facility] street [facility] [address]  Has the patient been seen for an appointment in the last year OR does the patient have an upcoming appointment? Yes.    Agent: Please be advised that RX refills may take up to 3 business days. We ask that you follow-up with your pharmacy.

## 2023-02-08 NOTE — Progress Notes (Signed)
  Care Management   Outreach Note  02/08/2023 Name: Karen [name] MRN: [id number] DOB: 03-02-1976  Second unsuccessful telephone outreach was attempted today to contact the patient about Chronic Care Management needs.    Follow Up Plan:  The care management team will reach out to the patient again over the next 7 days.  If patient returns call to provider office, please advise to call Embedded Care Management Care Guide Penne Lash * at [phone] Lash, RMA Care Guide [facility]  [place], [place] [address] Direct Dial: [phone] [email or web address]

## 2023-02-15 DIAGNOSIS — F411 Generalized anxiety disorder: Secondary | ICD-10-CM | POA: Diagnosis not present

## 2023-02-15 DIAGNOSIS — F331 Major depressive disorder, recurrent, moderate: Secondary | ICD-10-CM | POA: Diagnosis not present

## 2023-02-15 NOTE — Progress Notes (Signed)
  Care Coordination  Note  02/15/2023 Name: Karen [name] MRN: [id number] DOB: 07-Apr-1976  Karen [name] is a 47 y.o. year old female who is a primary care patient of Fisher, Demetrios Isaacs, MD. I reached out to [name] by phone today to offer care coordination services.      [name] was given information about Care Coordination services today including:  The Care Coordination services include support from the care team which includes your Nurse Coordinator, Clinical Social Worker, or Pharmacist.  The Care Coordination team is here to help remove barriers to the health concerns and goals most important to you. Care Coordination services are voluntary and the patient may decline or stop services at any time by request to their care team member.   Patient agreed to services and verbal consent obtained.   Follow up plan: Telephone appointment with care coordination team member scheduled for:02/21/2023  Penne Lash, RMA Care Guide [facility]  [place], [place] [address] Direct Dial: [phone] [email or web address]

## 2023-02-17 ENCOUNTER — Ambulatory Visit (INDEPENDENT_AMBULATORY_CARE_PROVIDER_SITE_OTHER): Payer: Medicare Other | Admitting: Physician Assistant

## 2023-02-17 ENCOUNTER — Encounter: Payer: Self-pay | Admitting: Physician Assistant

## 2023-02-17 ENCOUNTER — Other Ambulatory Visit: Payer: Self-pay | Admitting: Gastroenterology

## 2023-02-17 VITALS — BP 133/83 | HR 90 | Resp 16 | Ht 59.0 in

## 2023-02-17 DIAGNOSIS — R12 Heartburn: Secondary | ICD-10-CM | POA: Diagnosis not present

## 2023-02-17 DIAGNOSIS — R350 Frequency of micturition: Secondary | ICD-10-CM | POA: Diagnosis not present

## 2023-02-17 DIAGNOSIS — J029 Acute pharyngitis, unspecified: Secondary | ICD-10-CM | POA: Diagnosis not present

## 2023-02-17 DIAGNOSIS — Q796 Ehlers-Danlos syndrome, unspecified: Secondary | ICD-10-CM | POA: Diagnosis not present

## 2023-02-17 LAB — POCT URINALYSIS DIPSTICK
Appearance: NORMAL
Bilirubin, UA: NEGATIVE
Blood, UA: NEGATIVE
Glucose, UA: NEGATIVE
Ketones, UA: NEGATIVE
Leukocytes, UA: NEGATIVE
Nitrite, UA: NEGATIVE
Protein, UA: NEGATIVE
Spec Grav, UA: 1.01 (ref 1.010–1.025)
Urobilinogen, UA: 0.2 E.U./dL
pH, UA: 6 (ref 5.0–8.0)

## 2023-02-17 LAB — POCT RAPID STREP A (OFFICE): Rapid Strep A Screen: NEGATIVE

## 2023-02-17 LAB — POC COVID19 BINAXNOW: SARS Coronavirus 2 Ag: NEGATIVE

## 2023-02-17 MED ORDER — OMEPRAZOLE 40 MG PO CPDR: 40.0000 mg | DELAYED_RELEASE_CAPSULE | Freq: Two times a day (BID) | ORAL | 0 refills | Status: DC

## 2023-02-17 MED ORDER — FENTANYL 75 MCG/HR TD PT72
1.0000 | MEDICATED_PATCH | TRANSDERMAL | 0 refills | Status: DC
Start: 2023-03-04 — End: 2023-04-06

## 2023-02-17 NOTE — Progress Notes (Signed)
Established patient visit   Patient: Karen [name]   DOB: 04-15-1976   47 y.o. Female  MRN: [id number] Visit Date: 02/17/2023  Today's healthcare provider: Alfredia Ferguson, PA-C   Cc. Sore throat, urinary frequency   Subjective    HPI HPI   X2 weeks. Staying the same- hurts to swallow. Last edited by Dollene Primrose, CMA on 02/17/2023 11:00 AM.      Discussed the use of AI scribe software for clinical note transcription with the patient, who gave verbal consent to proceed.   History of Present Illness   Pt reports a sore throat, nasal congestion x 2 weeks. Denies cough, fevers. She has been without omeprazole for weeks due to issues with the pharmacy, and reports worsening acid reflux symptoms as a result.  She also suspects a urinary tract infection (UTI), a condition she is prone to, and requests a urinalysis. Reports urinary frequency.  The patient is also dealing with medication delivery and dosage issues. Her caretaker is due to be immobile for eight weeks and is trying to arrange for all her medications, including fentanyl, to be delivered by [facility]. However, [facility] is unsure [facility].      Medications: Outpatient Medications Prior to Visit  Medication Sig   albuterol (VENTOLIN HFA) 108 (90 Base) MCG/ACT inhaler Inhale 2 puffs into the lungs every 6 (six) hours as needed.   Armodafinil (NUVIGIL) 150 MG tablet Take 1 tablet (150 mg total) by mouth daily.   busPIRone (BUSPAR) 10 MG tablet TAKE 1 TABLET (10 MG TOTAL) BY MOUTH 2 (TWO) TIMES DAILY. TAKE ALONG WITH 15MG  TABLET TWICE A DAY   busPIRone (BUSPAR) 15 MG tablet TAKE 1 TABLET BY MOUTH TWICE A DAY   celecoxib (CELEBREX) 200 MG capsule TAKE 1 CAPSULE BY MOUTH TWICE A DAY   Ciclopirox 8 % KIT Apply 1 application  topically daily.   diclofenac Sodium (VOLTAREN) 1 % GEL APPLY AS NEEDED 3 TIMES A DAY   DULoxetine (CYMBALTA) 60 MG capsule Take 2 capsules (120 mg total) by mouth daily.    fexofenadine (ALLEGRA) 180 MG tablet Take 180 mg by mouth daily.   gabapentin (NEURONTIN) 800 MG tablet TAKE 1 TABLET BY MOUTH THREE TIMES A DAY   haloperidol (HALDOL) 0.5 MG tablet TAKE 1/2 OF A TABLET (0.25 MG TOTAL) BY MOUTH TWICE A DAY   lurasidone (LATUDA) 80 MG TABS tablet Take 1 tablet (80 mg total) by mouth daily with supper.   mometasone (ELOCON) 0.1 % cream Apply to rash BID up to two weeks.   naloxone (NARCAN) nasal spray 4 mg/0.1 mL Place 1 spray into the nose as needed.   ondansetron (ZOFRAN-ODT) 4 MG disintegrating tablet DISSOLVE 1 TABLET IN MOUTH EVERY 8 HOURS FOR NAUSEA AND VOMITING   QUEtiapine (SEROQUEL) 25 MG tablet TAKE 1 TABLET BY MOUTH EVERYDAY AT BEDTIME   [DISCONTINUED] fentaNYL (DURAGESIC) 75 MCG/HR Place 1 patch onto the skin every 3 (three) days.   [DISCONTINUED] omeprazole (PRILOSEC) 40 MG capsule TAKE 1 CAPSULE (40 MG TOTAL) BY MOUTH 2 (TWO) TIMES DAILY. SCHEDULE OFFICE VISIT   No facility-administered medications prior to visit.   Review of Systems  Constitutional:  Negative for fatigue and fever.  HENT:  Positive for congestion and sore throat.   Respiratory:  Negative for cough and shortness of breath.   Cardiovascular:  Negative for chest pain and leg swelling.  Gastrointestinal:  Negative for abdominal pain.  Genitourinary:  Positive for frequency.  Neurological:  Negative for dizziness and headaches.      Objective    BP 133/83   Pulse 90   Resp 16   Ht 4\' 11"  (1.499 m)   SpO2 100%   BMI 27.27 kg/m   Physical Exam Constitutional:      General: She is awake.     Appearance: She is well-developed.  HENT:     Head: Normocephalic.     Right Ear: Tympanic membrane normal.     Left Ear: Tympanic membrane normal.     Mouth/Throat:     Mouth: Mucous membranes are moist.     Pharynx: No posterior oropharyngeal erythema.     Tonsils: No tonsillar exudate or tonsillar abscesses.  Eyes:     Conjunctiva/sclera: Conjunctivae normal.   Cardiovascular:     Rate and Rhythm: Normal rate and regular rhythm.     Heart sounds: Normal heart sounds.  Pulmonary:     Effort: Pulmonary effort is normal.     Breath sounds: Normal breath sounds.  Skin:    General: Skin is warm.  Neurological:     Mental Status: She is alert and oriented to person, place, and time.  Psychiatric:        Attention and Perception: Attention normal.        Mood and Affect: Mood normal.        Speech: Speech normal.        Behavior: Behavior is cooperative.      Results for orders placed or performed in visit on 02/17/23  POC COVID-19  Result Value Ref Range   SARS Coronavirus 2 Ag Negative Negative  POCT rapid strep A  Result Value Ref Range   Rapid Strep A Screen Negative Negative  POCT Urinalysis Dipstick  Result Value Ref Range   Color, UA Yellow    Clarity, UA Cloudy    Glucose, UA Negative Negative   Bilirubin, UA Negative    Ketones, UA Negative    Spec Grav, UA 1.010 1.010 - 1.025   Blood, UA Negative    pH, UA 6.0 5.0 - 8.0   Protein, UA Negative Negative   Urobilinogen, UA 0.2 0.2 or 1.0 E.U./dL   Nitrite, UA Negative    Leukocytes, UA Negative Negative   Appearance Normal    Odor Strong     Assessment & Plan     Assessment and Plan    acute pharyngitis: -Resume Omeprazole for acid reflux control. -fluids, tylenol.  -strep neg, covid neg.  urinary frequency:  -UA neg, sent for culture d/t odor      5. Ehlers-Danlos syndrome - fentaNYL (DURAGESIC) 75 MCG/HR; Place 1 patch onto the skin every 3 (three) days.  Dispense: 10 patch; Refill: 0   Return if symptoms worsen or fail to improve.      I, Alfredia Ferguson, PA-C have reviewed all documentation for this visit. The documentation on  02/17/23   for the exam, diagnosis, procedures, and orders are all accurate and complete.  Alfredia Ferguson, PA-C [facility] [address] [place], [place], [address] Office: [phone] Fax: [phone]    [facility]

## 2023-02-19 LAB — URINE CULTURE

## 2023-02-20 ENCOUNTER — Other Ambulatory Visit: Payer: Self-pay | Admitting: Physician Assistant

## 2023-02-20 DIAGNOSIS — N3000 Acute cystitis without hematuria: Secondary | ICD-10-CM

## 2023-02-20 MED ORDER — NITROFURANTOIN MONOHYD MACRO 100 MG PO CAPS
100.0000 mg | ORAL_CAPSULE | Freq: Two times a day (BID) | ORAL | 0 refills | Status: DC
Start: 2023-02-20 — End: 2023-03-05

## 2023-02-21 ENCOUNTER — Telehealth: Payer: Self-pay

## 2023-02-21 LAB — URINE CULTURE

## 2023-02-27 ENCOUNTER — Other Ambulatory Visit: Payer: Self-pay | Admitting: Family Medicine

## 2023-02-27 ENCOUNTER — Other Ambulatory Visit: Payer: Self-pay

## 2023-02-27 DIAGNOSIS — J4531 Mild persistent asthma with (acute) exacerbation: Secondary | ICD-10-CM

## 2023-02-27 DIAGNOSIS — R12 Heartburn: Secondary | ICD-10-CM

## 2023-02-27 DIAGNOSIS — G4710 Hypersomnia, unspecified: Secondary | ICD-10-CM

## 2023-02-27 DIAGNOSIS — F32A Depression, unspecified: Secondary | ICD-10-CM

## 2023-02-27 MED ORDER — ARMODAFINIL 150 MG PO TABS
150.0000 mg | ORAL_TABLET | Freq: Every day | ORAL | 2 refills | Status: DC
Start: 2023-02-27 — End: 2023-03-31

## 2023-02-27 MED ORDER — OMEPRAZOLE 40 MG PO CPDR: 40.0000 mg | DELAYED_RELEASE_CAPSULE | Freq: Two times a day (BID) | ORAL | 0 refills | Status: DC

## 2023-02-27 MED ORDER — DICLOFENAC SODIUM 1 % EX GEL: CUTANEOUS | 3 refills | Status: DC

## 2023-02-27 MED ORDER — GABAPENTIN 800 MG PO TABS: ORAL_TABLET | ORAL | 11 refills | Status: DC

## 2023-02-27 MED ORDER — BUSPIRONE HCL 15 MG PO TABS: 15.0000 mg | ORAL_TABLET | Freq: Two times a day (BID) | ORAL | 3 refills | Status: DC

## 2023-02-27 MED ORDER — ALBUTEROL SULFATE HFA 108 (90 BASE) MCG/ACT IN AERS: 2.0000 | INHALATION_SPRAY | Freq: Four times a day (QID) | RESPIRATORY_TRACT | 5 refills | Status: DC | PRN

## 2023-02-27 MED ORDER — QUETIAPINE FUMARATE 25 MG PO TABS: 25.0000 mg | ORAL_TABLET | Freq: Every day | ORAL | 4 refills | Status: DC

## 2023-02-27 MED ORDER — CELECOXIB 200 MG PO CAPS: 200.0000 mg | ORAL_CAPSULE | Freq: Two times a day (BID) | ORAL | 5 refills | Status: DC

## 2023-02-27 MED ORDER — DULOXETINE HCL 60 MG PO CPEP: 120.0000 mg | ORAL_CAPSULE | Freq: Every day | ORAL | 3 refills | Status: DC

## 2023-02-27 MED ORDER — BUSPIRONE HCL 10 MG PO TABS: ORAL_TABLET | ORAL | 3 refills | Status: DC

## 2023-02-27 MED ORDER — ONDANSETRON 4 MG PO TBDP: 4.0000 mg | ORAL_TABLET | Freq: Three times a day (TID) | ORAL | 5 refills | Status: DC | PRN

## 2023-02-27 NOTE — Telephone Encounter (Signed)
Copied from CRM [id number]. Topic: General - Other >> Feb 27, 2023  2:04 PM Everette C wrote: Reason for CRM: The patient would like to be contacted by a member of practice staff when possible to review their prescriptions   Please contact further when possible  [facility]  Patient is requesting a refill on nitrofurantoin. She reports she is still having "burning, down there." She reports chills, denies fevers, she is currently on her period. She reports drinking a lot of water and cranberry juice, and wiping front to back only. She reports she is not able to come for a visit as her mother just had surgery and is not able to bring her to the clinic.   Please advise.

## 2023-03-02 ENCOUNTER — Telehealth: Payer: Self-pay

## 2023-03-02 ENCOUNTER — Telehealth (INDEPENDENT_AMBULATORY_CARE_PROVIDER_SITE_OTHER): Payer: Self-pay | Admitting: Family

## 2023-03-02 ENCOUNTER — Ambulatory Visit: Payer: Self-pay | Admitting: *Deleted

## 2023-03-02 ENCOUNTER — Telehealth: Payer: Self-pay | Admitting: *Deleted

## 2023-03-02 NOTE — Progress Notes (Unsigned)
Established patient visit  Patient: Karen [name]   DOB: 1975/12/17   47 y.o. Female  MRN: [id number] Visit Date: 03/03/2023  Today's healthcare provider: Debera Lat, PA-C   Chief Complaint  Patient presents with   Acute Visit    Possible uti , pt had uti  about 2 weeks ago she feel like she still has one , she has finished  given  5 day round  BID     Subjective    HPI HPI     Acute Visit    Additional comments: Possible uti , pt had uti  about 2 weeks ago she feel like she still has one , she has finished  given  5 day round  BID        Last edited by Annabell Sabal, CMA on 03/03/2023  1:30 PM.      *** Discussed the use of AI scribe software for clinical note transcription with the patient, who gave verbal consent to proceed.  History of Present Illness   The patient, with a history of urinary tract infection, presents with persistent burning in the pelvic region despite a 5-day course of Macrobid. She reports adherence to the prescribed regimen and has been supplementing with increased fluid intake, cranberry juice, and cranberry pills. She denies any history of kidney stones. The patient also mentions a desire to increase their fentanyl dosage, which is managed by Dr. Sherrie Mustache.            Summary: Still experiencing urinary frequency and burning.     Pt is requesting more antibiotics because antibiotics did not work the first time. Still experiencing urinary frequency and burning. As well as urine, which is sometimes cloudy and has a smell. She mentioned that she is drinking a lot more apple and cranberry juice than she used to.   Stated would like to increase the dose for her fentaNYL (DURAGESIC) 75 MCG/HR to 100 MCG. Mentioned if anything is going to be sent in, [facility].          03/03/2023    1:31 PM 02/17/2023   10:51 AM 01/03/2023    3:24 PM  Depression screen PHQ 2/9  Decreased Interest 1 1 1   Down,  Depressed, Hopeless 1 1 0  PHQ - 2 Score 2 2 1   Altered sleeping 0 0   Tired, decreased energy 1 1   Change in appetite 3 1   Feeling bad or failure about yourself  1 0   Trouble concentrating 1 0   Moving slowly or fidgety/restless 0 0   Suicidal thoughts 0 0   PHQ-9 Score 8 4   Difficult doing work/chores Not difficult at all        10/31/2022    3:46 PM 11/25/2021   10:07 AM  GAD 7 : Generalized Anxiety Score  Nervous, Anxious, on Edge 3   Control/stop worrying 0   Worry too much - different things 1   Trouble relaxing 1   Restless 3   Easily annoyed or irritable 0   Afraid - awful might happen 0   Total GAD 7 Score 8   Anxiety Difficulty Not difficult at all      Information is confidential and restricted. Go to Review Flowsheets to unlock data.    Medications: Outpatient Medications Prior to Visit  Medication Sig   albuterol (VENTOLIN HFA) 108 (90 Base) MCG/ACT inhaler Inhale 2 puffs into the lungs every 6 (six)  hours as needed.   busPIRone (BUSPAR) 10 MG tablet TAKE 1 TABLET (10 MG TOTAL) BY MOUTH 2 (TWO) TIMES DAILY. TAKE ALONG WITH 15MG  TABLET TWICE A DAY   busPIRone (BUSPAR) 15 MG tablet Take 1 tablet (15 mg total) by mouth 2 (two) times daily.   celecoxib (CELEBREX) 200 MG capsule Take 1 capsule (200 mg total) by mouth 2 (two) times daily.   Ciclopirox 8 % KIT Apply 1 application  topically daily.   diclofenac Sodium (VOLTAREN) 1 % GEL APPLY AS NEEDED 3 TIMES A DAY   DULoxetine (CYMBALTA) 60 MG capsule Take 2 capsules (120 mg total) by mouth daily.   [START ON 03/04/2023] fentaNYL (DURAGESIC) 75 MCG/HR Place 1 patch onto the skin every 3 (three) days.   fexofenadine (ALLEGRA) 180 MG tablet Take 180 mg by mouth daily.   gabapentin (NEURONTIN) 800 MG tablet TAKE 1 TABLET BY MOUTH THREE TIMES A DAY   haloperidol (HALDOL) 0.5 MG tablet TAKE 1/2 OF A TABLET (0.25 MG TOTAL) BY MOUTH TWICE A DAY   lurasidone (LATUDA) 80 MG TABS tablet Take 1 tablet (80 mg total) by mouth  daily with supper.   naloxone (NARCAN) nasal spray 4 mg/0.1 mL Place 1 spray into the nose as needed.   omeprazole (PRILOSEC) 40 MG capsule Take 1 capsule (40 mg total) by mouth 2 (two) times daily.   ondansetron (ZOFRAN-ODT) 4 MG disintegrating tablet Take 1 tablet (4 mg total) by mouth every 8 (eight) hours as needed for nausea or vomiting. DISSOLVE 1 TABLET IN MOUTH EVERY 8 HOURS FOR NAUSEA AND VOMITING   QUEtiapine (SEROQUEL) 25 MG tablet Take 1 tablet (25 mg total) by mouth at bedtime.   Armodafinil (NUVIGIL) 150 MG tablet Take 1 tablet (150 mg total) by mouth daily. (Patient not taking: Reported on 03/03/2023)   mometasone (ELOCON) 0.1 % cream Apply to rash BID up to two weeks.   nitrofurantoin, macrocrystal-monohydrate, (MACROBID) 100 MG capsule Take 1 capsule (100 mg total) by mouth 2 (two) times daily.   No facility-administered medications prior to visit.    Review of Systems Except see HPI   {Insert previous labs (optional):23779}  {See past labs  Heme  Chem  Endocrine  Serology  Results Review (optional):1}   Objective    BP 110/82   Pulse 85   Wt 135 lb (61.2 kg)   SpO2 98%   BMI 27.27 kg/m  {Insert last BP/Wt (optional):23777}  {See vitals history (optional):1}  Physical Exam   Results for orders placed or performed in visit on 03/03/23  POCT Urinalysis Dipstick  Result Value Ref Range   Color, UA yellow    Clarity, UA cloudy    Glucose, UA Negative Negative   Bilirubin, UA 0.2    Ketones, UA trace    Spec Grav, UA 1.025 1.010 - 1.025   Blood, UA modrate    pH, UA 7.5 5.0 - 8.0   Protein, UA Positive (A) Negative   Urobilinogen, UA 0.2 0.2 or 1.0 E.U./dL   Nitrite, UA neg    Leukocytes, UA Moderate (2+) (A) Negative   Appearance     Odor      Assessment & Plan    *** Assessment and Plan    Urinary Tract Infection: Persistent burning sensation despite Macrobid treatment. No history of kidney stones. No flank pain. Pain localized to the pelvic  region. Previous culture showed susceptibility to Macrobid, but symptoms persist. -Prescribe Bactrim for 7 days, to be sent through [facility]. -  If symptoms improve within 5 days, discontinue Bactrim. -If yeast infection develops, contact the office immediately. -Consider over-the-counter AZO for immediate relief if [facility] delivery is delayed. -Resend urine for culture and sensitivity. -Continue hydration and cranberry intake.  Pain Management: Request to increase Fentanyl dosage. -Message to be sent to Dr. Sherrie Mustache for consideration as it is a controlled substance.         No follow-ups on file.     The patient was advised to call back or seek an in-person evaluation if the symptoms worsen or if the condition fails to improve as anticipated.  I discussed the assessment and treatment plan with the patient. The patient was provided an opportunity to ask questions and all were answered. The patient agreed with the plan and demonstrated an understanding of the instructions.  I, Debera Lat, PA-C have reviewed all documentation for this visit. The documentation on  03/03/23  for the exam, diagnosis, procedures, and orders are all accurate and complete.  Debera Lat, [facility], MMS [facility] [phone] (phone) [phone] (fax)  [facility]

## 2023-03-02 NOTE — Telephone Encounter (Signed)
  Name of who is calling: Jillene Bucks Relationship to Patient:sel  Best contact number: [phone]  Provider they see: Inetta Fermo  Reason for call: Karen [name] called in to let Inetta Fermo know she doesn't need to prescribe haloperidol (HALDOL) anymore bc she has someone else doing and they do it at a higher dose with more pills. Plans to still come for checkups Follow up if needed.      PRESCRIPTION REFILL ONLY  Name of prescription:  Pharmacy:

## 2023-03-02 NOTE — Telephone Encounter (Signed)
Summary: Still experiencing urinary frequency and burning.   Pt is requesting more antibiotics because antibiotics did not work the first time. Still experiencing urinary frequency and burning. As well as urine, which is sometimes cloudy and has a smell. She mentioned that she is drinking a lot more apple and cranberry juice than she used to.   Stated would like to increase the dose for her fentaNYL (DURAGESIC) 75 MCG/HR to 100 MCG. Mentioned if anything is going to be sent in, [facility].  Seeking clinical advice.       Chief Complaint: continued sx UTI, completed course of antibiotics from last OV 02/20/23, requesting additional antibiotics and increase iin fentanyl dose from 75 to 100 Symptoms: urinary frequency, burning, odor, cloudy urine. Completed macrobid antibiotic course order from 02/20/23 OV.  Frequency: since 02/20/23 Pertinent Negatives: Patient denies fever no abdominal pain  Disposition: [] ED /[] Urgent Care (no appt availability in office) / [x] Appointment(In office/virtual)/ []  [place] [facility]/ [] Home Care/ [] Refused Recommended Disposition /[] [place] Mobile [facility]/ []  Follow-up with PCP Additional Notes:   Appt scheduled tomorrow with provider other than PCP . No available appt with PCP. Patient mother reports she may need to cancel appt tomorrow due to required time to schedule transportation for patient . Requesting Dr. Sherrie Mustache to increase dose of fentanyl to 100. Please advise       Reason for Disposition  All other patients with painful urination  (Exception: [1] EITHER frequency or urgency AND [2] has on-call doctor.)  Answer Assessment - Initial Assessment Questions 1. SEVERITY: "How bad is the pain?"  (e.g., Scale 1-10; mild, moderate, or severe)   - MILD (1-3): complains slightly about urination hurting   - MODERATE (4-7): interferes with normal activities     - SEVERE (8-10): excruciating, unwilling or unable to  urinate because of the pain      Na  2. FREQUENCY: "How many times have you had painful urination today?"      Na  3. PATTERN: "Is pain present every time you urinate or just sometimes?"      Na  4. ONSET: "When did the painful urination start?"      02/20/23 5. FEVER: "Do you have a fever?" If Yes, ask: "What is your temperature, how was it measured, and when did it start?"     No  6. PAST UTI: "Have you had a urine infection before?" If Yes, ask: "When was the last time?" and "What happened that time?"      Yes last treated 02/20/23 antibiotics completed and continues with sx 7. CAUSE: "What do you think is causing the painful urination?"  (e.g., UTI, scratch, Herpes sore)     UTI  8. OTHER SYMPTOMS: "Do you have any other symptoms?" (e.g., blood in urine, flank pain, genital sores, urgency, vaginal discharge)     Urinary frequency, burning, cloudy urine, odor 9. PREGNANCY: "Is there any chance you are pregnant?" "When was your last menstrual period?"     Na  Protocols used: Urination Pain - Female-A-AH

## 2023-03-02 NOTE — Telephone Encounter (Signed)
Telephone encounter was:  Successful.  03/02/2023 Name: Karen [name] MRN: [id number] DOB: [birth year]  Karen [name] is a 47 y.o. year old female who is a primary care patient of Sherrie Mustache, Demetrios Isaacs, MD . The community resource team was consulted for assistance with Transportation Needs  Patient mother gets [facility] transportation and needed to urgently get daughter to PCP as patient recently had surgery and is unable to drive now scheduled the ride for her not sure if it will get picked up  Care guide performed the following interventions: Patient provided with information about care guide support team and interviewed to confirm resource needs.  Follow Up Plan:  Care guide will follow up with patient by phone over the next day to confirm   Shiheem Corporan Greenauer -[facility], Population Health [phone] [address] [place] , [place] [address] Email : [email or web address]     Karen [name] DOB: 08-24-1975 MRN: [id number]   RIDER WAIVER AND RELEASE OF LIABILITY  For purposes of improving physical access to our facilities, [place] is pleased to partner with third parties to provide [place] patients or other authorized individuals the option of convenient, on-demand ground transportation services (the Chiropractor") through use of the technology service that enables users to request on-demand ground transportation from independent third-party providers.  By opting to use and accept these [employer], I, the undersigned, hereby agree on behalf of myself, and on behalf of any minor child using the Science writer for whom I am the parent or legal guardian, as follows:  Science writer provided to me are provided by independent third-party transportation providers who are not [employer] or employees and who are unaffiliated with [employer]. [name] is neither a transportation carrier nor a common or public carrier. [facility] has no control over the quality or safety of the transportation that occurs as a result of the [employer]. [place] cannot guarantee that any third-party transportation provider will complete any arranged transportation service. [place] makes no representation, warranty, or guarantee regarding the reliability, timeliness, quality, safety, suitability, or availability of any of the Transport Services or that they will be error free. I fully understand that traveling by vehicle involves risks and dangers of serious bodily injury, including permanent disability, paralysis, and death. I agree, on behalf of myself and on behalf of any minor child using the Transport Services for whom I am the parent or legal guardian, that the entire risk arising out of my use of the [employer] remains solely with me, to the maximum extent permitted under applicable law. The [employer] are provided "as is" and "as available." [place] disclaims all representations and warranties, express, implied or statutory, not expressly set out in these terms, including the implied warranties of merchantability and fitness for a particular purpose. I hereby waive and release [place], its agents, employees, officers, directors, representatives, insurers, attorneys, assigns, successors, subsidiaries, and affiliates from any and all past, present, or future claims, demands, liabilities, actions, causes of action, or suits of any kind directly or indirectly arising from acceptance and use of the [employer]. I further waive and release  and its affiliates from all present and future liability and responsibility for any injury or death to persons or damages to property caused by or related to the use of the [employer]. I have read this Waiver and Release of Liability, and I understand the terms used in  it and their legal significance. This Waiver is freely and voluntarily given  with the understanding that my right (as well as the right of any minor child for whom I am the parent or legal guardian using the [employer]) to legal recourse against [place] in connection with the [employer] is knowingly surrendered in return for use of these services.   I attest that I read the consent document to [name], gave [name] the opportunity to ask questions and answered the questions asked (if any). I affirm that [name] then provided consent for she's participation in this program.     [name]

## 2023-03-02 NOTE — Telephone Encounter (Signed)
Copied from CRM [id number]. Topic: General - Other >> Mar 02, 2023  3:50 PM Turkey B wrote: Reason for CRM: [facility] in states, med gel , diclofenac 1%  is on backorder. Cb # is  and ref #[id number]

## 2023-03-03 ENCOUNTER — Encounter: Payer: Self-pay | Admitting: Physician Assistant

## 2023-03-03 ENCOUNTER — Ambulatory Visit (INDEPENDENT_AMBULATORY_CARE_PROVIDER_SITE_OTHER): Payer: Medicare Other | Admitting: Physician Assistant

## 2023-03-03 VITALS — BP 110/82 | HR 85 | Wt 135.0 lb

## 2023-03-03 DIAGNOSIS — N3001 Acute cystitis with hematuria: Secondary | ICD-10-CM

## 2023-03-03 LAB — POCT URINALYSIS DIPSTICK
Bilirubin, UA: 0.2
Glucose, UA: NEGATIVE
Nitrite, UA: NEGATIVE
Protein, UA: POSITIVE — AB
Spec Grav, UA: 1.025 (ref 1.010–1.025)
Urobilinogen, UA: 0.2 E.U./dL
pH, UA: 7.5 (ref 5.0–8.0)

## 2023-03-03 MED ORDER — SULFAMETHOXAZOLE-TRIMETHOPRIM 800-160 MG PO TABS
1.0000 | ORAL_TABLET | Freq: Two times a day (BID) | ORAL | 0 refills | Status: DC
Start: 2023-03-03 — End: 2023-05-09

## 2023-03-06 ENCOUNTER — Ambulatory Visit: Payer: Medicare Other | Admitting: Family Medicine

## 2023-03-08 DIAGNOSIS — F332 Major depressive disorder, recurrent severe without psychotic features: Secondary | ICD-10-CM | POA: Diagnosis not present

## 2023-03-08 DIAGNOSIS — E559 Vitamin D deficiency, unspecified: Secondary | ICD-10-CM | POA: Diagnosis not present

## 2023-03-09 ENCOUNTER — Other Ambulatory Visit: Payer: Self-pay | Admitting: Family Medicine

## 2023-03-09 DIAGNOSIS — J4531 Mild persistent asthma with (acute) exacerbation: Secondary | ICD-10-CM

## 2023-03-10 ENCOUNTER — Ambulatory Visit: Payer: Self-pay | Admitting: *Deleted

## 2023-03-10 NOTE — Telephone Encounter (Signed)
Chief Complaint: Medication Question Symptoms: UTI Disposition: [] ED /[] Urgent Care (no appt availability in office) / [] Appointment(In office/virtual)/ []  [place] Virtual [facility]/ [x] Home Care/ [] Refused Recommended Disposition /[] [place] Mobile [facility]/ []  Follow-up with PCP Additional Notes: Patient called to ask if her antibiotic Bactrim had been sent to her mail order pharmacy because she had not received it in the mail yet. I advised that the medication was ordered and [facility] on [facility]03/23. Patient stated she would call the pharmacy to check the status of the prescription.  She is unable to pick medication up from a local pharmacy at this time. Reason for Disposition  Caller has medicine question only, adult not sick, AND triager answers question  Answer Assessment - Initial Assessment Questions 1. NAME of MEDICINE: "What medicine(s) are you calling about?"     Bactrim antibiotic  2. QUESTION: "What is your question?" (e.g., double dose of medicine, side effect)     [facility]? 3. PRESCRIBER: "Who prescribed the medicine?" Reason: if prescribed by specialist, call should be referred to that group.     Eileen Stanford, PA  4. SYMPTOMS: "Do you have any symptoms?" If Yes, ask: "What symptoms are you having?"  "How bad are the symptoms (e.g., mild, moderate, severe)     UTI  Protocols used: Medication Question Call-A-AH

## 2023-03-10 NOTE — Telephone Encounter (Signed)
Summary: med ?   Pt called in says Dr Mercy Moore was talked about prescribing bactrum for UTI at appt on July 19. I don't see anything about this      Called patient #[phone] and DPR [phone] regarding questions for bactrim for UTI. No answer on either #. Unable to leave message VM on both # full.

## 2023-03-10 NOTE — Telephone Encounter (Signed)
Refilled 02/27/23 with 5 refills. Requested Prescriptions  Refused Prescriptions Disp Refills   VENTOLIN HFA 108 (90 Base) MCG/ACT inhaler [Pharmacy Med Name: VENTOLIN HFA 90 MCG INHALER] 18 each 5    Sig: TAKE 2 PUFFS BY MOUTH EVERY 6 HOURS AS NEEDED     Pulmonology:  Beta Agonists 2 Passed - 03/09/2023  8:12 PM      Passed - Last BP in normal range    BP Readings from Last 1 Encounters:  03/03/23 110/82         Passed - Last Heart Rate in normal range    Pulse Readings from Last 1 Encounters:  03/03/23 85         Passed - Valid encounter within last 12 months    Recent Outpatient Visits           1 week ago Acute cystitis with hematuria   [facility] [place], [place], PA-C   3 weeks ago Acute pharyngitis, unspecified etiology   [facility] Alfredia Ferguson, PA-C   2 months ago Hypersomnia   [facility] Malva Limes, MD   4 months ago Other fatigue   [facility] Malva Limes, MD   8 months ago Ehlers-Danlos syndrome   [facility] Malva Limes, MD       Future Appointments             In 3 months Deirdre Evener, MD [facility]

## 2023-03-13 ENCOUNTER — Telehealth: Payer: Self-pay | Admitting: *Deleted

## 2023-03-13 NOTE — Telephone Encounter (Signed)
Telephone encounter was:  Successful.  03/13/2023 Name: ARIANNY PRISBREY MRN: [id number] DOB: 1976-01-27  HALAH JAREMA is a 47 y.o. year old female who is a primary care patient of Fisher, Demetrios Isaacs, MD . The community resource team was consulted for assistance with Transportation Needs   Care guide performed the following interventions: Patient provided with information about care guide support team and interviewed to confirm resource needs. Scheduled rides for EEg and other neurology.  Follow Up Plan:  No further follow up planned at this time. The patient has been provided with needed resources.Yehuda Mao [facility] -[facility] [place], Population Health [phone] [address] [place] , [place] [address] Email : [email or web address]       JUANDA KAHELE DOB: [birth year] MRN: [id number]   RIDER WAIVER AND RELEASE OF LIABILITY  For purposes of improving physical access to our facilities, [place] is pleased to partner with third parties to provide [place] patients or other authorized individuals the option of convenient, on-demand ground transportation services (the Chiropractor") through use of the technology service that enables users to request on-demand ground transportation from independent third-party providers.  By opting to use and accept these [employer], I, the undersigned, hereby agree on behalf of myself, and on behalf of any minor child using the Science writer for whom I am the parent or legal guardian, as follows:  Science writer provided to me are provided by independent third-party transportation providers who are not [employer] or employees and who are unaffiliated with [employer]. [place] is neither a transportation carrier nor a common or public carrier. [place] has no control over the quality or safety of the transportation that occurs as a result of the [employer]. [place]  cannot guarantee that any third-party transportation provider will complete any arranged transportation service. [place] makes no representation, warranty, or guarantee regarding the reliability, timeliness, quality, safety, suitability, or availability of any of the Transport Services or that they will be error free. I fully understand that traveling by vehicle involves risks and dangers of serious bodily injury, including permanent disability, paralysis, and death. I agree, on behalf of myself and on behalf of any minor child using the Transport Services for whom I am the parent or legal guardian, that the entire risk arising out of my use of the [employer] remains solely with me, to the maximum extent permitted under applicable law. The [employer] are provided "as is" and "as available." [place] disclaims all representations and warranties, express, implied or statutory, not expressly set out in these terms, including the implied warranties of merchantability and fitness for a particular purpose. I hereby waive and release [place], its agents, employees, officers, directors, representatives, insurers, attorneys, assigns, successors, subsidiaries, and affiliates from any and all past, present, or future claims, demands, liabilities, actions, causes of action, or suits of any kind directly or indirectly arising from acceptance and use of the [employer]. I further waive and release [place] and its affiliates from all present and future liability and responsibility for any injury or death to persons or damages to property caused by or related to the use of the [employer]. I have read this Waiver and Release of Liability, and I understand the terms used in it and their legal significance. This Waiver is freely and voluntarily given with the understanding that my right (as well as the right of any minor child for whom I  am the parent or legal guardian using the  [employer]) to legal recourse against [place] in connection with the [employer] is knowingly surrendered in return for use of these services.   I attest that I read the consent document to [name], gave Ms. Harg[place]on the opportunity to ask questions and answered the questions asked (if any). I affirm that [name] then provided consent for she's participation in this program.     [name]

## 2023-03-14 ENCOUNTER — Telehealth: Payer: Self-pay

## 2023-03-14 NOTE — Telephone Encounter (Signed)
Copied from CRM [id number]. Topic: General - Other >> Mar 13, 2023  3:38 PM Jannifer Rodney M wrote: Reason for CRM: Pt requests that all her prescriptions be sent to [facility] [place] [place] [facility] [address] Portal to Registered [facility] Sites Phone: [phone] Fax: [phone]

## 2023-03-15 DIAGNOSIS — F332 Major depressive disorder, recurrent severe without psychotic features: Secondary | ICD-10-CM | POA: Diagnosis not present

## 2023-03-16 DIAGNOSIS — F332 Major depressive disorder, recurrent severe without psychotic features: Secondary | ICD-10-CM | POA: Diagnosis not present

## 2023-03-23 ENCOUNTER — Ambulatory Visit (INDEPENDENT_AMBULATORY_CARE_PROVIDER_SITE_OTHER): Payer: Medicare Other | Admitting: Family

## 2023-03-27 ENCOUNTER — Telehealth: Payer: Self-pay | Admitting: Family Medicine

## 2023-03-27 NOTE — Telephone Encounter (Signed)
Pt is calling in requesting to speak with Debera Lat because she believes she still has a UTI and she finished the medication but she got it 10 days late because of [facility] and pt says she can't take a shower because there is something in the tub that is unable to be removed until the plumbers come. Pt is requesting someone follow up with her. [phone]

## 2023-03-28 NOTE — Telephone Encounter (Signed)
Called patient to offered appointment per patient she will need transportation and it will take her 2 weeks to get one. She has an appointment coming up with Dr. Sherrie Mustache  08/30.   Please review and advise

## 2023-03-29 ENCOUNTER — Other Ambulatory Visit: Payer: Self-pay | Admitting: Physician Assistant

## 2023-03-29 DIAGNOSIS — F332 Major depressive disorder, recurrent severe without psychotic features: Secondary | ICD-10-CM | POA: Diagnosis not present

## 2023-03-29 DIAGNOSIS — M2604 Mandibular hypoplasia: Secondary | ICD-10-CM | POA: Diagnosis not present

## 2023-03-29 DIAGNOSIS — M5137 Other intervertebral disc degeneration, lumbosacral region: Secondary | ICD-10-CM | POA: Diagnosis not present

## 2023-03-29 DIAGNOSIS — J342 Deviated nasal septum: Secondary | ICD-10-CM | POA: Diagnosis not present

## 2023-03-29 DIAGNOSIS — M5136 Other intervertebral disc degeneration, lumbar region: Secondary | ICD-10-CM | POA: Diagnosis not present

## 2023-03-30 ENCOUNTER — Other Ambulatory Visit: Payer: Self-pay | Admitting: Family Medicine

## 2023-03-30 DIAGNOSIS — G4710 Hypersomnia, unspecified: Secondary | ICD-10-CM

## 2023-03-30 NOTE — Telephone Encounter (Signed)
Requested medication (s) are due for refill today - no  Requested medication (s) are on the active medication list -yes  Future visit scheduled -yes  Last refill: 02/27/23 #30 2RF  Notes to clinic: non delegated Rx  Requested Prescriptions  Pending Prescriptions Disp Refills   Armodafinil 150 MG tablet [Pharmacy Med Name: ARMODAFINIL 150 MG TABLET] 30 tablet 2    Sig: TAKE 1 TABLET BY MOUTH EVERY DAY     Not Delegated - Psychiatry:  Stimulants/ADHD Failed - 03/30/2023  2:39 AM      Failed - This refill cannot be delegated      Failed - Urine Drug Screen completed in last 360 days      Passed - Last BP in normal range    BP Readings from Last 1 Encounters:  03/03/23 110/82         Passed - Last Heart Rate in normal range    Pulse Readings from Last 1 Encounters:  03/03/23 85         Passed - Valid encounter within last 6 months    Recent Outpatient Visits           3 weeks ago Acute cystitis with hematuria   [place] [facility] [place], [place], PA-C   1 month ago Acute pharyngitis, unspecified etiology   [place] [facility] Alfredia Ferguson, PA-C   3 months ago Hypersomnia   [place] [facility] Malva Limes, MD   5 months ago Other fatigue   [facility] Malva Limes, MD   8 months ago Ehlers-Danlos syndrome   [place] [facility] Malva Limes, MD       Future Appointments             In 2 weeks Sherrie Mustache, Demetrios Isaacs, MD [facility]   In 2 months Deirdre Evener, MD [facility]               Requested Prescriptions  Pending Prescriptions Disp Refills   Armodafinil 150 MG tablet [Pharmacy Med Name: ARMODAFINIL 150 MG TABLET] 30 tablet 2    Sig: TAKE 1 TABLET BY MOUTH EVERY DAY     Not Delegated - Psychiatry:  Stimulants/ADHD Failed - 03/30/2023  2:39 AM      Failed - This refill cannot be  delegated      Failed - Urine Drug Screen completed in last 360 days      Passed - Last BP in normal range    BP Readings from Last 1 Encounters:  03/03/23 110/82         Passed - Last Heart Rate in normal range    Pulse Readings from Last 1 Encounters:  03/03/23 85         Passed - Valid encounter within last 6 months    Recent Outpatient Visits           3 weeks ago Acute cystitis with hematuria    [facility] [place], [place], PA-C   1 month ago Acute pharyngitis, unspecified etiology   [facility] Alfredia Ferguson, PA-C   3 months ago Hypersomnia   [facility] Malva Limes, MD   5 months ago Other fatigue   [facility] Malva Limes, MD   8 months ago Ehlers-Danlos syndrome   [facility] Mila Merry  E, MD       Future Appointments             In 2 weeks Fisher, Demetrios Isaacs, MD [facility]   In 2 months Deirdre Evener, MD [facility]

## 2023-04-05 ENCOUNTER — Other Ambulatory Visit: Payer: Self-pay | Admitting: Family Medicine

## 2023-04-05 DIAGNOSIS — Q796 Ehlers-Danlos syndrome, unspecified: Secondary | ICD-10-CM

## 2023-04-05 NOTE — Telephone Encounter (Signed)
Copied from CRM [id number]. Topic: General - Other >> Apr 05, 2023  5:18 PM Everette C wrote: Reason for CRM: Medication Refill - Medication: gabapentin (NEURONTIN) 800 MG tablet [[id number]]  fentaNYL (DURAGESIC) 75 MCG/HR [[id number]]  Has the patient contacted their pharmacy? Yes.   (Agent: If no, request that the patient contact the pharmacy for the refill. If patient does not wish to contact the pharmacy document the reason why and proceed with request.) (Agent: If yes, when and what did the pharmacy advise?)  Preferred Pharmacy (with phone number or street name): [facility] [place] - [address] [facility] AT Portal to Reg[facility] [address] [place] [address] Phone: [phone] Fax: [phone] Hours: Not open 24 hours   Has the patient been seen for an appointment in the last year OR does the patient have an upcoming appointment? Yes.    Agent: Please be advised that RX refills may take up to 3 business days. We ask that you follow-up with your pharmacy.

## 2023-04-06 ENCOUNTER — Telehealth: Payer: Self-pay

## 2023-04-06 DIAGNOSIS — R300 Dysuria: Secondary | ICD-10-CM

## 2023-04-06 NOTE — Telephone Encounter (Signed)
Requested medication (s) are due for refill today:   Requested medication (s) are on the active medication list: Yes  Last refill:  Gabapentin filled 02/27/23    Fentanyl   Future visit scheduled: Yes  Notes to clinic:  Fentanyl not delegated.    Requested Prescriptions  Pending Prescriptions Disp Refills   gabapentin (NEURONTIN) 800 MG tablet 90 tablet 11    Sig: TAKE 1 TABLET BY MOUTH THREE TIMES A DAY     Neurology: Anticonvulsants - gabapentin Failed - 04/06/2023  8:23 AM      Failed - Cr in normal range and within 360 days    Creatinine  Date Value Ref Range Status  02/19/2014 0.82 0.60 - 1.30 mg/dL Final   Creatinine, Ser  Date Value Ref Range Status  08/02/2022 1.06 (H) 0.57 - 1.00 mg/dL Final         Passed - Completed PHQ-2 or PHQ-9 in the last 360 days      Passed - Valid encounter within last 12 months    Recent Outpatient Visits           1 month ago Acute cystitis with hematuria   [place] [facility] Gisela, [place], PA-C   1 month ago Acute pharyngitis, unspecified etiology   [place] [facility] Alfredia Ferguson, PA-C   3 months ago Hypersomnia   [facility] Malva Limes, MD   5 months ago Other fatigue   [place] [facility] Malva Limes, MD   9 months ago Ehlers-Danlos syndrome   [facility] Malva Limes, MD       Future Appointments             In 1 week Fisher, Demetrios Isaacs, MD [facility]   In 2 months Deirdre Evener, MD [facility]             fentaNYL (DURAGESIC) 75 MCG/HR 10 patch 0    Sig: Place 1 patch onto the skin every 3 (three) days.     Not Delegated - Analgesics:  Opioid Agonists Failed - 04/06/2023  8:23 AM      Failed - This refill cannot be delegated      Failed - Urine Drug Screen completed in last 360 days      Passed - Valid encounter within last 3  months    Recent Outpatient Visits           1 month ago Acute cystitis with hematuria   [place] [facility] [place], [place], PA-C   1 month ago Acute pharyngitis, unspecified etiology   [place] [facility]ia Ferguson, PA-C   3 months ago Hypersomnia   [facility] Malva Limes, MD   5 months ago Other fatigue   [facility] Malva Limes, MD   9 months ago Ehlers-Danlos syndrome   [facility] Malva Limes, MD       Future Appointments             In 1 week Fisher, Demetrios Isaacs, MD [facility]   In 2 months Deirdre Evener, MD [facility]

## 2023-04-06 NOTE — Telephone Encounter (Signed)
Copied from CRM [id number]. Topic: Referral - Request for Referral >> Apr 05, 2023  5:20 PM Karen [name] wrote: Has patient seen PCP for this complaint? Yes.   *If NO, is insurance requiring patient see PCP for this issue before PCP can refer them? Referral for which specialty: Urology  Preferred provider/office: patient has no preference  Reason for referral: urinary discomfort

## 2023-04-07 ENCOUNTER — Telehealth: Payer: Self-pay | Admitting: *Deleted

## 2023-04-07 MED ORDER — FENTANYL 75 MCG/HR TD PT72: 1.0000 | MEDICATED_PATCH | TRANSDERMAL | 0 refills | Status: DC

## 2023-04-07 MED ORDER — GABAPENTIN 800 MG PO TABS: ORAL_TABLET | ORAL | 3 refills | Status: DC

## 2023-04-07 NOTE — Telephone Encounter (Signed)
   Karen [name] DOB: 29-Sep-1975 MRN: [id number]   RIDER WAIVER AND RELEASE OF LIABILITY  For purposes of improving physical access to our facilities, [facility] is pleased to partner with third parties to provide [facility] patients or other authorized individuals the option of convenient, on-demand ground transportation services (the Chiropractor") through use of the technology service that enables users to request on-demand ground transportation from independent third-party providers.  By opting to use and accept these [employer], I, the undersigned, hereby agree on behalf of myself, and on behalf of any minor child using the Science writer for whom I am the parent or legal guardian, as follows:  Science writer provided to me are provided by independent third-party transportation providers who are not [employer] or employees and who are unaffiliated with [employer]. [place] is neither a transportation carrier nor a common or public carrier. [facility] has no control over the quality or safety of the transportation that occurs as a result of the [employer]. [facility] cannot guarantee that any third-party transportation provider will complete any arranged transportation service. [facility] makes no representation, warranty, or guarantee regarding the reliability, timeliness, quality, safety, suitability, or availability of any of the Transport Services or that they will be error free. I fully understand that traveling by vehicle involves risks and dangers of serious bodily injury, including permanent disability, paralysis, and death. I agree, on behalf of myself and on behalf of any minor child using the Transport Services for whom I am the parent or legal guardian, that the entire risk arising out of my use of the [employer] remains solely with me, to the maximum extent permitted under applicable law. The [employer] are provided  "as is" and "as available." [facility] disclaims all representations and warranties, express, implied or statutory, not expressly set out in these terms, including the implied warranties of merchantability and fitness for a particular purpose. I hereby waive and release [facility], its agents, employees, officers, directors, representatives, insurers, attorneys, assigns, successors, subsidiaries, and affiliates from any and all past, present, or future claims, demands, liabilities, actions, causes of action, or suits of any kind directly or indirectly arising from acceptance and use of the [employer]. I further waive and release [facility] and its affiliates from all present and future liability and responsibility for any injury or death to persons or damages to property caused by or related to the use of the [employer]. I have read this Waiver and Release of Liability, and I understand the terms used in it and their legal significance. This Waiver is freely and voluntarily given with the understanding that my right (as well as the right of any minor child for whom I am the parent or legal guardian using the [employer]) to legal recourse against [facility] in connection with the [employer] is knowingly surrendered in return for use of these services.   I attest that I read the consent document to Karen [name], gave Ms. Smirl the opportunity to ask questions and answered the questions asked (if any). I affirm that Karen [name] then provided consent for she's participation in this program.     [name]

## 2023-04-10 ENCOUNTER — Telehealth: Payer: Self-pay | Admitting: Family Medicine

## 2023-04-10 NOTE — Telephone Encounter (Signed)
Pt stated is not getting her fentaNYL (DURAGESIC) 75 MCG/HR patches until Thursday as they are shipped to her. Is asking if the office has possible samples she can pick up.   May have a ride to the office tomorrow and would like to pick up if possible.   Please advise.

## 2023-04-10 NOTE — Progress Notes (Signed)
04/11/2023 11:09 AM   Karen [name] Dec 23, 1975 [id number]  Referring provider: Malva Limes, MD [address] [place],  [place] [address]  Urological history: 1. rUTI's -Contributing factors of age, incontinence, nonambulatory, incomplete bladder emptying and constipation -CT, 04/2021 - The left kidney demonstrates moderate renal cortical scarring changes.  Marked distention of the bladder is noted. It extends well above the umbilicus. Possible bladder outlet obstruction. No bladder wall thickening or bladder mass. No bladder calculi. -Documented urine cultures over the last year  February 17, 2023 - E.coli  July 10, 2022 - E.coli   May 10, 2022 - MUF    2. Incontinence -Contributing factors of age, Ehlers-Danlos syndrome, fibromyalgia, sleep apnea, nonambulatory, asthma, depression and seizures  Chief Complaint  Patient presents with   Recurrent UTI   Urinary Incontinence    HPI: Karen [name] is a 47 y.o. female who presents today for recheck on urine with her mother, Karen [name].   Previous records reviewed.    She is having 1-7 daytime urinations, nocturia x 1-2 with a strong urge to urinate.  She does have stress incontinence.  She is leaking 3 or more times a week.  She wears 1-2 absorbent pads daily.  She does limit fluid intake and she does engage in toilet mapping.  She states she has had two UTI's recently and continues to have symptoms.  Her symptoms consist of urgency, frequency and dysuria.    Patient denies any modifying or aggravating factors.  Patient denies any gross hematuria, dysuria or suprapubic/flank pain.  Patient denies any fevers, chills, nausea or vomiting.   UA yellow cloudy, specific gravity 1.010, pH 6.5, 0-5 WBCs, 0-2 RBCs, 0-10 epithelial cells, and amorphous sediment present and moderate bacteria  PVR 0 mL   PMH: Past Medical History:  Diagnosis Date   Ehlers-Danlos syndrome    Fibromyalgia    Headache     Polyp of descending colon    Sleep apnea    Tics of organic origin    Transient alteration of awareness     Surgical History: Past Surgical History:  Procedure Laterality Date   COLONOSCOPY WITH PROPOFOL N/A 02/13/2018   Procedure: COLONOSCOPY WITH PROPOFOL;  Surgeon: Midge Minium, MD;  Location: [facility] ENDOSCOPY;  Service: Endoscopy;  Laterality: N/A;   COLONOSCOPY WITH PROPOFOL N/A 01/11/2022   Procedure: COLONOSCOPY WITH PROPOFOL;  Surgeon: Midge Minium, MD;  Location[facility]COPY;  Service: Endoscopy;  Laterality: N/A;   DILATION AND CURETTAGE OF UTERUS  08/16/2007   ESOPHAGOGASTRODUODENOSCOPY (EGD) WITH PROPOFOL  02/13/2018   Procedure: ESOPHAGOGASTRODUODENOSCOPY (EGD) WITH PROPOFOL;  Surgeon: Midge Minium, MD;  Location: [facility] ENDOSCOPY;  Service: Endoscopy;;   EYE SURGERY Left 08/15/1988   3 Surgeries on left eye to correct crossed eye   LAPAROSCOPY Left 08/15/2001   Fallopian Tube   MANDIBLE SURGERY  08/15/1996   OTHER SURGICAL HISTORY Right 08/15/1985   3 Surgeries to repair broken arm   OTHER SURGICAL HISTORY Left    3 surgeries on her left thigh as an infant   OTHER SURGICAL HISTORY  08/15/1996   Jaw surgery   Right arm surgery  08/15/1985   x 3 due to fracture   TOENAIL EXCISION Bilateral 06/13/2019   Procedure: BILATERAL SECOND TOE PARTIAL NAIL ABLATION;  Surgeon: Terance Hart, MD;  Location[facility];  Service: Orthopedics;  Laterality: Bilateral;  SURGERY REQUEST TIME 1 HOUR   TONSILLECTOMY  12/13/2002   TONSILLECTOMY  Home Medications:  Allergies as of 04/11/2023       Reactions   Augmentin  [amoxicillin-pot Clavulanate] Diarrhea   Chocolate Flavor    Other reaction(s): Other (See Comments) Wheezing, acne   Demerol [meperidine] Other (See Comments)   Slow to wake up when this drug is given.    Keflex [cephalexin] Nausea And Vomiting   Morphine And Codeine Nausea And Vomiting   Tape Dermatitis   Must use paper tape only    Toradol [ketorolac Tromethamine] Nausea And Vomiting   Amoxicillin    Other reaction(s): Unknown   Chocolate    GI distress   Nsaids    patient develops ulcers Other reaction(s): Other (See Comments) patient develops ulcers   Strawberry Extract    GI distress        Medication List        Accurate as of April 11, 2023 11:59 PM. If you have any questions, ask your nurse or doctor.          albuterol 108 (90 Base) MCG/ACT inhaler Commonly known as: Ventolin HFA Inhale 2 puffs into the lungs every 6 (six) hours as needed.   Armodafinil 150 MG tablet TAKE 1 TABLET BY MOUTH EVERY DAY   busPIRone 15 MG tablet Commonly known as: BUSPAR Take 1 tablet (15 mg total) by mouth 2 (two) times daily.   busPIRone 10 MG tablet Commonly known as: BUSPAR TAKE 1 TABLET (10 MG TOTAL) BY MOUTH 2 (TWO) TIMES DAILY. TAKE ALONG WITH 15MG  TABLET TWICE A DAY   celecoxib 200 MG capsule Commonly known as: CELEBREX Take 1 capsule (200 mg total) by mouth 2 (two) times daily.   Ciclopirox 8 % Kit Apply 1 application  topically daily.   diclofenac Sodium 1 % Gel Commonly known as: VOLTAREN APPLY AS NEEDED 3 TIMES A DAY   DULoxetine 60 MG capsule Commonly known as: CYMBALTA Take 2 capsules (120 mg total) by mouth daily.   fentaNYL 75 MCG/HR Commonly known as: DURAGESIC Place 1 patch onto the skin every 3 (three) days.   fexofenadine 180 MG tablet Commonly known as: ALLEGRA Take 180 mg by mouth daily.   gabapentin 800 MG tablet Commonly known as: NEURONTIN TAKE 1 TABLET BY MOUTH THREE TIMES A DAY   haloperidol 0.5 MG tablet Commonly known as: HALDOL TAKE 1/2 OF A TABLET (0.25 MG TOTAL) BY MOUTH TWICE A DAY   lurasidone 80 MG Tabs tablet Commonly known as: Latuda Take 1 tablet (80 mg total) by mouth daily with supper.   mometasone 0.1 % cream Commonly known as: ELOCON Apply to rash BID up to two weeks.   naloxone 4 MG/0.1ML Liqd nasal spray kit Commonly known as:  NARCAN Place 1 spray into the nose as needed.   nitrofurantoin (macrocrystal-monohydrate) 100 MG capsule Commonly known as: MACROBID Take 1 capsule (100 mg total) by mouth every 12 (twelve) hours. Started by: Michiel Cowboy   omeprazole 40 MG capsule Commonly known as: PRILOSEC Take 1 capsule (40 mg total) by mouth 2 (two) times daily.   ondansetron 4 MG disintegrating tablet Commonly known as: ZOFRAN-ODT Take 1 tablet (4 mg total) by mouth every 8 (eight) hours as needed for nausea or vomiting. DISSOLVE 1 TABLET IN MOUTH EVERY 8 HOURS FOR NAUSEA AND VOMITING   QUEtiapine 25 MG tablet Commonly known as: SEROQUEL Take 1 tablet (25 mg total) by mouth at bedtime.   sulfamethoxazole-trimethoprim 800-160 MG tablet Commonly known as: BACTRIM DS Take 1 tablet by mouth 2 (  two) times daily.        Allergies:  Allergies  Allergen Reactions   Augmentin  [Amoxicillin-Pot Clavulanate] Diarrhea   Chocolate Flavor     Other reaction(s): Other (See Comments) Wheezing, acne   Demerol [Meperidine] Other (See Comments)    Slow to wake up when this drug is given.    Keflex [Cephalexin] Nausea And Vomiting   Morphine And Codeine Nausea And Vomiting   Tape Dermatitis    Must use paper tape only   Toradol [Ketorolac Tromethamine] Nausea And Vomiting   Amoxicillin     Other reaction(s): Unknown   Chocolate     GI distress   Nsaids     patient develops ulcers Other reaction(s): Other (See Comments) patient develops ulcers   Strawberry Extract     GI distress    Family History: Family History  Problem Relation Age of Onset   Depression Mother    Stroke Mother    Fibromyalgia Mother    Osteoarthritis Mother    Asthma Brother    Depression Brother    Migraines Brother    Asperger's syndrome Brother    Other Brother        Ehlers-Danlos Syndrome   Cancer Maternal Aunt    Asperger's syndrome Maternal Aunt    Breast cancer Maternal Aunt    Heart disease Paternal Aunt    Heart  disease Paternal Uncle    Cervical cancer Maternal Grandmother    Osteoporosis Maternal Grandmother        Died at 78   Osteoarthritis Maternal Grandmother    Colon cancer Maternal Grandfather    Asthma Maternal Grandfather    Asperger's syndrome Maternal Grandfather    Emphysema Maternal Grandfather        Died at 56   Prostate cancer Maternal Grandfather    Heart attack Paternal Grandfather        Died at 69   Asthma Paternal Grandfather    Tuberculosis Paternal Grandfather    Cancer Cousin    Asperger's syndrome Cousin    Asperger's syndrome Other    Heart Problems Paternal Grandmother        Died at 41   Ehlers-Danlos syndrome Father    Ehlers-Danlos syndrome Brother     Social History:  reports that she has never smoked. She has never used smokeless tobacco. She reports that she does not drink alcohol and does not use drugs.  ROS: Pertinent ROS in HPI  Physical Exam: BP (!) 142/96   Pulse 90   Ht 4\' 11"  (1.499 m)   Wt 135 lb (61.2 kg)   LMP 04/09/2023 (Approximate)   BMI 27.27 kg/m   Constitutional:  Well nourished. Alert and oriented, No acute distress. HEENT: [place] AT, moist mucus membranes.  Trachea midline Cardiovascular: No clubbing, cyanosis, or edema. Respiratory: Normal respiratory effort, no increased work of breathing. Neurologic: Grossly intact, no focal deficits, moving all 4 extremities.  In wheelchair.   Psychiatric: Normal mood and affect.    Laboratory Data: CMP     Component Value Date/Time   NA 143 08/02/2022 1508   NA 140 02/19/2014 0035   K 4.6 08/02/2022 1508   K 3.9 02/19/2014 0035   CL 102 08/02/2022 1508   CL 105 02/19/2014 0035   CO2 27 08/02/2022 1508   CO2 29 02/19/2014 0035   GLUCOSE 92 08/02/2022 1508   GLUCOSE 104 (H) 07/10/2022 2038   GLUCOSE 86 02/19/2014 0035   BUN 16 08/02/2022 1508  BUN 9 02/19/2014 0035   CREATININE 1.06 (H) 08/02/2022 1508   CREATININE 0.82 02/19/2014 0035   CALCIUM 9.0 08/02/2022 1508   CALCIUM  9.0 02/19/2014 0035   PROT 6.4 08/02/2022 1508   PROT 6.7 02/19/2014 0035   ALBUMIN 4.1 08/02/2022 1508   ALBUMIN 3.6 02/19/2014 0035   AST 15 08/02/2022 1508   AST 20 02/19/2014 0035   ALT 10 08/02/2022 1508   ALT 27 02/19/2014 0035   ALKPHOS 94 08/02/2022 1508   ALKPHOS 73 02/19/2014 0035   BILITOT <0.2 08/02/2022 1508   BILITOT 0.2 02/19/2014 0035   EGFR 66 08/02/2022 1508   GFRNONAA >60 07/10/2022 2038   GFRNONAA >60 02/19/2014 0035     Urinalysis Results for orders placed or performed in visit on 04/11/23  CULTURE, URINE COMPREHENSIVE   Specimen: Urine   UR  Result Value Ref Range   Urine Culture, Comprehensive Preliminary report    Organism ID, Bacteria Comment   Microscopic Examination   Urine  Result Value Ref Range   WBC, UA 0-5 0 - 5 /hpf   RBC, Urine 0-2 0 - 2 /hpf   Epithelial Cells (non renal) 0-10 0 - 10 /hpf   Crystals Present (A) N/A   Crystal Type Amorphous Sediment N/A   Bacteria, UA Moderate (A) None seen/Few  Urinalysis, Complete  Result Value Ref Range   Specific Gravity, UA 1.010 1.005 - 1.030   pH, UA 6.5 5.0 - 7.5   Color, UA Yellow Yellow   Appearance Ur Cloudy (A) Clear   Leukocytes,UA Negative Negative   Protein,UA Negative Negative/Trace   Glucose, UA Negative Negative   Ketones, UA Negative Negative   RBC, UA Negative Negative   Bilirubin, UA Negative Negative   Urobilinogen, Ur 1.0 0.2 - 1.0 mg/dL   Nitrite, UA Negative Negative   Microscopic Examination See below:   BLADDER SCAN AMB NON-IMAGING  Result Value Ref Range   Scan Result 0 ml       I have reviewed the labs.  Pertinent Imaging  04/11/23 15:46  Scan Result 0 ml     Assessment & Plan:    1. rUTI's -she has continued to experience symptoms of frequency, urgency and dysuria despite two rounds of culture appropriate antibiotics -UA today is unremarkable -urine sent for culture to rule out indolent infection-Macrobid sent to pharmacy as patient was adamant about  starting an antibiotic even though I explained the risks of further antibiotic use in the setting of negative cultures  -RUS ordered for further evaluation of symptoms -may consider cysto pending culture results and RUS results -also may be symptoms of GSM   2. Incontinence -Back at baseline symptoms -Does not want to pursue further treatment of incontinence at this time due to fear of a return of her psychosis -may be contributing to LU TS if sitting in wet depends for long periods of time -will need to perform pelvic exam if RUS results or urine culture do not explain her symptoms   Return in about 3 weeks (around 05/02/2023) for RUS report .  These notes generated with voice recognition software. I apologize for typographical errors.  Cloretta Ned  [facility] [address]  [address] [place], [place] [address] [phone]

## 2023-04-11 ENCOUNTER — Emergency Department
Admission: EM | Admit: 2023-04-11 | Discharge: 2023-04-11 | Disposition: A | Discharge status: Home / Self Care | Payer: Medicare Other | Attending: Emergency Medicine | Admitting: Emergency Medicine

## 2023-04-11 ENCOUNTER — Encounter: Payer: Self-pay | Admitting: *Deleted

## 2023-04-11 ENCOUNTER — Ambulatory Visit: Payer: Medicare Other | Admitting: Urology

## 2023-04-11 ENCOUNTER — Other Ambulatory Visit: Payer: Self-pay

## 2023-04-11 VITALS — BP 142/96 | HR 90 | Ht 59.0 in | Wt 135.0 lb

## 2023-04-11 DIAGNOSIS — G894 Chronic pain syndrome: Secondary | ICD-10-CM | POA: Diagnosis not present

## 2023-04-11 DIAGNOSIS — N390 Urinary tract infection, site not specified: Secondary | ICD-10-CM

## 2023-04-11 DIAGNOSIS — R350 Frequency of micturition: Secondary | ICD-10-CM | POA: Diagnosis not present

## 2023-04-11 DIAGNOSIS — N939 Abnormal uterine and vaginal bleeding, unspecified: Secondary | ICD-10-CM | POA: Insufficient documentation

## 2023-04-11 DIAGNOSIS — Z87440 Personal history of urinary (tract) infections: Secondary | ICD-10-CM

## 2023-04-11 DIAGNOSIS — R300 Dysuria: Secondary | ICD-10-CM | POA: Diagnosis not present

## 2023-04-11 DIAGNOSIS — N3946 Mixed incontinence: Secondary | ICD-10-CM

## 2023-04-11 DIAGNOSIS — G8929 Other chronic pain: Secondary | ICD-10-CM | POA: Diagnosis not present

## 2023-04-11 DIAGNOSIS — Z760 Encounter for issue of repeat prescription: Secondary | ICD-10-CM | POA: Insufficient documentation

## 2023-04-11 LAB — BLADDER SCAN AMB NON-IMAGING: Scan Result: 0

## 2023-04-11 LAB — CBC
HCT: 39.1 % (ref 36.0–46.0)
Hemoglobin: 13 g/dL (ref 12.0–15.0)
MCH: 29.1 pg (ref 26.0–34.0)
MCHC: 33.2 g/dL (ref 30.0–36.0)
MCV: 87.7 fL (ref 80.0–100.0)
Platelets: 340 10*3/uL (ref 150–400)
RBC: 4.46 MIL/uL (ref 3.87–5.11)
RDW: 13.1 % (ref 11.5–15.5)
WBC: 6.8 10*3/uL (ref 4.0–10.5)
nRBC: 0 % (ref 0.0–0.2)

## 2023-04-11 MED ORDER — FENTANYL 75 MCG/HR TD PT72
1.0000 | MEDICATED_PATCH | Freq: Once | TRANSDERMAL | Status: DC
  Administered 2023-04-11: 1 via TRANSDERMAL
  Filled 2023-04-11: qty 1

## 2023-04-11 MED ORDER — NITROFURANTOIN MONOHYD MACRO 100 MG PO CAPS: 100.0000 mg | ORAL_CAPSULE | Freq: Two times a day (BID) | ORAL | 0 refills | Status: DC

## 2023-04-11 NOTE — ED Triage Notes (Addendum)
Pt to triage via wheelchair.  Pt reports she is out of her fentanyl patch since yesterday.  Pt had rx refilled.  Pt states it will not come in the mail for 2 more days.  Pt alert.  Pt reports no patch use yesterday or today.

## 2023-04-11 NOTE — ED Provider Notes (Signed)
[facility] Provider Note    Event Date/Time   First MD Initiated Contact with Patient 04/11/23 1804     (approximate)   History   Medication Refill   HPI  Karen [name] is a 47 y.o. female   past medical history significant for chronic pain on fentanyl patches, who presents to the emergency department for pain.  States that she ran out of her fentanyl patches and she does not get it until Thursday given that it is a mail order.  States that she has been having significant pain since that time.  Also complaining of vaginal bleeding that has been irregular.  States that she feels lightheaded.     Physical Exam   Triage Vital Signs: ED Triage Vitals  Encounter Vitals Group     BP 04/11/23 1714 (!) 146/89     Systolic BP Percentile --      Diastolic BP Percentile --      Pulse Rate 04/11/23 1714 86     Resp 04/11/23 1714 18     Temp 04/11/23 1714 98.5 F (36.9 C)     Temp Source 04/11/23 1714 Oral     SpO2 04/11/23 1714 100 %     Weight 04/11/23 1711 134 lb 7.7 oz (61 kg)     Height 04/11/23 1711 4\' 11"  (1.499 m)     Head Circumference --      Peak Flow --      Pain Score 04/11/23 1711 9     Pain Loc --      Pain Education --      Exclude from Growth Chart --     Most recent vital signs: Vitals:   04/11/23 1714  BP: (!) 146/89  Pulse: 86  Resp: 18  Temp: 98.5 F (36.9 C)  SpO2: 100%    Physical Exam Constitutional:      Appearance: She is well-developed.  HENT:     Head: Atraumatic.  Cardiovascular:     Rate and Rhythm: Regular rhythm.  Pulmonary:     Effort: No respiratory distress.  Musculoskeletal:        General: Normal range of motion.     Cervical back: Normal range of motion.  Skin:    General: Skin is warm.  Neurological:     Mental Status: She is alert. Mental status is at baseline.      IMPRESSION / MDM / ASSESSMENT AND PLAN / ED COURSE  I reviewed the triage vital signs and the nursing  notes.  Differential diagnosis including abnormal uterine bleeding, perimenopausal, anemia   Labs (all labs ordered are listed, but only abnormal results are displayed) Labs interpreted as -    Labs Reviewed  CBC      No significant anemia or leukocytosis.  Lab work reassuring.  On chart review patient is prescribed fentanyl patches 75 mcg every 3 days.  Given that her fentanyl patches arrive on Thursday will give 1 fentanyl patch while she is in the emergency department.  Discussed at length that we would not be doing this again and that she needs to follow-up with her primary care physician.  Discussed follow-up with gynecology for abnormal uterine bleeding.  Given information to establish care.  Given return precautions for any worsening  bleeding.   PROCEDURES:  Critical Care performed: No  Procedures  Patient's presentation is most consistent with acute, uncomplicated illness.   MEDICATIONS ORDERED IN ED: Medications  fentaNYL (DURAGESIC) 75 MCG/HR 1 patch (  1 patch Transdermal Patch Applied 04/11/23 1938)    FINAL CLINICAL IMPRESSION(S) / ED DIAGNOSES   Final diagnoses:  Chronic pain syndrome  Medication refill  Abnormal uterine bleeding     Rx / DC Orders   ED Discharge Orders     None        Note:  This document was prepared using Dragon voice recognition software and may include unintentional dictation errors.   Corena Herter, MD 04/11/23 2255

## 2023-04-11 NOTE — ED Notes (Signed)
Pt A&O x4, no obvious distress noted, respirations regular/unlabored. Pt verbalizes understanding of discharge instructions. Pt wheeled out independently.

## 2023-04-11 NOTE — Telephone Encounter (Signed)
Patient advised we do not have any samples of fentanyl patches.

## 2023-04-11 NOTE — Discharge Instructions (Addendum)
You were seen in the emergency department for pain and for abnormal uterine bleeding.  Your hemoglobin level was normal.  Follow-up with your primary care physician for your ongoing symptoms.  You are given information to follow-up with gynecology for further workup for your lower abdominal pain and abnormal uterine bleeding.

## 2023-04-11 NOTE — Patient Instructions (Addendum)
[email or web address]

## 2023-04-12 ENCOUNTER — Telehealth: Payer: Self-pay

## 2023-04-12 LAB — MICROSCOPIC EXAMINATION

## 2023-04-12 LAB — URINALYSIS, COMPLETE
Bilirubin, UA: NEGATIVE
Glucose, UA: NEGATIVE
Ketones, UA: NEGATIVE
Leukocytes,UA: NEGATIVE
Nitrite, UA: NEGATIVE
Protein,UA: NEGATIVE
RBC, UA: NEGATIVE
Specific Gravity, UA: 1.01 (ref 1.005–1.030)
Urobilinogen, Ur: 1 mg/dL (ref 0.2–1.0)
pH, UA: 6.5 (ref 5.0–7.5)

## 2023-04-12 NOTE — Telephone Encounter (Signed)
   Telephone encounter was:  Successful.  04/12/2023 Name: Karen [name] MRN: [id number] DOB: 1976-05-11  Karen [name] is a 47 y.o. year old female who is a primary care patient of Fisher, Demetrios Isaacs, MD . The community resource team was consulted for assistance with Transportation Needs   Care guide performed the following interventions: Patient provided with information about care guide support team and interviewed to confirm resource needs.Pts mother called asking about her transportation needing a ride from her home to [facility] round trip. Pt has already had this ride set up by another care guide on 8/23. Pt has no other needs at this time stated by the mother   Follow Up Plan:  No further follow up planned at this time. The patient has been provided with needed resources.    Lenard Forth [place]  [facility], [facility] Guide, Phone: [phone] Website: [email or web address]

## 2023-04-14 ENCOUNTER — Telehealth: Payer: Medicare Other | Admitting: Family Medicine

## 2023-04-14 ENCOUNTER — Ambulatory Visit (INDEPENDENT_AMBULATORY_CARE_PROVIDER_SITE_OTHER): Payer: Medicare Other | Admitting: Family Medicine

## 2023-04-14 ENCOUNTER — Ambulatory Visit: Payer: Medicare Other | Admitting: Family Medicine

## 2023-04-14 ENCOUNTER — Encounter: Payer: Self-pay | Admitting: Family Medicine

## 2023-04-14 VITALS — BP 134/92 | HR 103 | Temp 97.9°F | Resp 16

## 2023-04-14 DIAGNOSIS — Z124 Encounter for screening for malignant neoplasm of cervix: Secondary | ICD-10-CM

## 2023-04-14 DIAGNOSIS — Z23 Encounter for immunization: Secondary | ICD-10-CM

## 2023-04-14 DIAGNOSIS — M5136 Other intervertebral disc degeneration, lumbar region: Secondary | ICD-10-CM | POA: Diagnosis not present

## 2023-04-14 DIAGNOSIS — N938 Other specified abnormal uterine and vaginal bleeding: Secondary | ICD-10-CM | POA: Diagnosis not present

## 2023-04-14 DIAGNOSIS — G44221 Chronic tension-type headache, intractable: Secondary | ICD-10-CM

## 2023-04-14 DIAGNOSIS — J4530 Mild persistent asthma, uncomplicated: Secondary | ICD-10-CM

## 2023-04-14 DIAGNOSIS — F331 Major depressive disorder, recurrent, moderate: Secondary | ICD-10-CM

## 2023-04-14 DIAGNOSIS — E041 Nontoxic single thyroid nodule: Secondary | ICD-10-CM

## 2023-04-14 MED ORDER — QUETIAPINE FUMARATE 100 MG PO TABS: 100.0000 mg | ORAL_TABLET | Freq: Every day | ORAL | 1 refills | Status: DC

## 2023-04-14 NOTE — Progress Notes (Addendum)
Established patient visit   Patient: Karen [name]   DOB: [birth year]   47 y.o. Female  MRN: [id number] Visit Date: 04/14/2023  Today's healthcare provider: Mila Merry, MD   Chief Complaint  Patient presents with   Medical Management of Chronic Issues    Patient reports good compliance and tolerance with medications. Patient reports she was seen at ED and they did lab work due to long periods. Patient has not scheduled appt with GYN. Patient requesting increase on her fentanyl 75 mcg. Patient requesting increase in seroquel for sleep. She reports its not helping. And that she has tried taking 2 tablets 50 mg total, and still not working.    Subjective    Discussed the use of AI scribe software for clinical note transcription with the patient, who gave verbal consent to proceed.  History of Present Illness   The patient presents for follow up chronic pain related to DDD, ehlers-danlos syndrome, arthritis, fibromyalgia and prior head injury. She is currently on a fentanyl patch, reports a worsening of fibromyalgia symptoms over the past six months. She has been experiencing difficulty sleeping and has requested a switch to temazepam from Seroquel, which she reports is no longer effective. However, due to the potential risk of respiratory sedation when combined with fentanyl, this change was not recommended. She is prescribed 25mg  of Seroquel and has tried taking 2 at night which was also not effective.  She has taken up to 200mg  Seroquel hs in the past.  The patient has previously tried Ambien and is currently using 20mg  of melatonin, but these have not been effective.  In addition to these issues, the patient reports experiencing prolonged periods of uterine bleeding lasting up to three weeks. This has resulted in mood swings and irritability, and the patient suspects anemia. However, a recent ER visit confirmed a hemoglobin level of 7, which is within the normal range. The  patient plans to consult with a gynecologist for further evaluation.  The patient also has a history of a thyroid nodule, which was identified during a period of illness related to psychosis, and is due for follow up thyroid ultrasound.  Overall, the patient's primary concerns are the worsening of her fibromyalgia symptoms, difficulty sleeping, and prolonged uterine bleeding.       Medications: Outpatient Medications Prior to Visit  Medication Sig   albuterol (VENTOLIN HFA) 108 (90 Base) MCG/ACT inhaler Inhale 2 puffs into the lungs every 6 (six) hours as needed.   Armodafinil 150 MG tablet TAKE 1 TABLET BY MOUTH EVERY DAY   busPIRone (BUSPAR) 10 MG tablet TAKE 1 TABLET (10 MG TOTAL) BY MOUTH 2 (TWO) TIMES DAILY. TAKE ALONG WITH 15MG  TABLET TWICE A DAY   busPIRone (BUSPAR) 15 MG tablet Take 1 tablet (15 mg total) by mouth 2 (two) times daily.   celecoxib (CELEBREX) 200 MG capsule Take 1 capsule (200 mg total) by mouth 2 (two) times daily.   Ciclopirox 8 % KIT Apply 1 application  topically daily.   diclofenac Sodium (VOLTAREN) 1 % GEL APPLY AS NEEDED 3 TIMES A DAY   DULoxetine (CYMBALTA) 60 MG capsule Take 2 capsules (120 mg total) by mouth daily.   fentaNYL (DURAGESIC) 75 MCG/HR Place 1 patch onto the skin every 3 (three) days.   fexofenadine (ALLEGRA) 180 MG tablet Take 180 mg by mouth daily.   gabapentin (NEURONTIN) 800 MG tablet TAKE 1 TABLET BY MOUTH THREE TIMES A DAY   haloperidol (HALDOL) 0.5  MG tablet TAKE 1/2 OF A TABLET (0.25 MG TOTAL) BY MOUTH TWICE A DAY   lurasidone (LATUDA) 80 MG TABS tablet Take 1 tablet (80 mg total) by mouth daily with supper.   mometasone (ELOCON) 0.1 % cream Apply to rash BID up to two weeks.   naloxone (NARCAN) nasal spray 4 mg/0.1 mL Place 1 spray into the nose as needed.   nitrofurantoin, macrocrystal-monohydrate, (MACROBID) 100 MG capsule Take 1 capsule (100 mg total) by mouth every 12 (twelve) hours.   omeprazole (PRILOSEC) 40 MG capsule Take 1  capsule (40 mg total) by mouth 2 (two) times daily.   ondansetron (ZOFRAN-ODT) 4 MG disintegrating tablet Take 1 tablet (4 mg total) by mouth every 8 (eight) hours as needed for nausea or vomiting. DISSOLVE 1 TABLET IN MOUTH EVERY 8 HOURS FOR NAUSEA AND VOMITING   sulfamethoxazole-trimethoprim (BACTRIM DS) 800-160 MG tablet Take 1 tablet by mouth 2 (two) times daily.   [DISCONTINUED] QUEtiapine (SEROQUEL) 25 MG tablet Take 1 tablet (25 mg total) by mouth at bedtime.   No facility-administered medications prior to visit.       Objective    BP (!) 134/92 (BP Location: Right Arm, Patient Position: Sitting, Cuff Size: Large)   Pulse (!) 103   Temp 97.9 F (36.6 C) (Temporal)   Resp 16   LMP 04/09/2023 (Approximate)   SpO2 99%   Physical Exam  General appearance: Well developed, well nourished female, sitting in motorized wheelchair, cooperative and in no acute distress Head: Normocephalic, without obvious abnormality, atraumatic Respiratory: Respirations even and unlabored, normal respiratory rate Extremities: All extremities are intact.  Skin: Skin color, texture, turgor normal. No rashes seen  Psych: Appropriate mood and affect. Neurologic: Mental status: Alert, oriented to person, place, and time, thought content appropriate.   Assessment & Plan        Chronic pain secondary to Fibromyalgia, DDD and arthritis Worsening symptoms over the past six months. Currently on Fentanyl . -Plan to increase to . -Send in refill for Fentanyl to [facility] before the next refill is due the last week of September  Insomnia Current treatment with Seroquel ineffective. Discussed risks of combining Fentanyl with Temazepam or Ambien due to potential respiratory sedation, especially in the context of sleep apnea. Patient currently using Melatonin 20mg . -Increase Seroquel to 100mg  for sleep. -Continue Melatonin 20mg .  Abnormal Uterine Bleeding Reports of prolonged periods  lasting three weeks with associated mood changes and suspected anemia. Hemoglobin checked in ER and was 7. Plan to see a gynecologist. -Referral to female practitioner at [facility].  Thyroid Nodule Previous ultrasound showed 1.8cm nodule. Recommended follow-up ultrasound to monitor changes. -Schedule [facility].  General Health Maintenance -Administer influenza vaccine today.    No follow-ups on file.     Addendum 07/19/2023 Patient continues to require power wheelchair due to immobility resulting from generalized weakness and musculoskeletal pain from severe Ehlers-danlos syndrome and rheumatoid arthritis. She is not able to ambulate and lacks upper extremity strength required to operate a manual wheelchair. Her current motorized wheelchair is worn and occasionally inoperable and she requires PT evaluation for replacement motorized wheelchair.    Mila Merry, MD  [facility] [phone] (phone) [phone] (fax)  [facility]

## 2023-04-14 NOTE — Patient Instructions (Signed)
.   Please review the attached list of medications and notify my office if there are any errors.   . Please bring all of your medications to every appointment so we can make sure that our medication list is the same as yours.   

## 2023-04-15 ENCOUNTER — Encounter: Payer: Self-pay | Admitting: Urology

## 2023-04-18 ENCOUNTER — Telehealth: Payer: Self-pay | Admitting: *Deleted

## 2023-04-18 ENCOUNTER — Ambulatory Visit: Payer: Self-pay

## 2023-04-18 LAB — CULTURE, URINE COMPREHENSIVE

## 2023-04-18 NOTE — Telephone Encounter (Signed)
Telephone encounter was:  Successful.  04/18/2023 Name: Karen [name] MRN: [id number] DOB: Nov 26, 1975  Karen [name] is a 47 y.o. year old female who is a primary care patient of Sherrie Mustache, Demetrios Isaacs, MD . The community resource team was consulted for assistance with Transportation Needs  called to schedule ride for 04/20/2023  Care guide performed the following interventions:  Scheduled transportation with trusted rides for appt  .  Follow Up Plan:  No further follow up planned at this time. The patient has been provided with needed resources. Karen [name] [facility]  Population Health Careguide  Direct Dial: [phone] Website: [email or web address]       Karen [name] DOB: 09-12-1975 MRN: [id number]   RIDER WAIVER AND RELEASE OF LIABILITY  For purposes of improving physical access to our facilities, [facility] is pleased to partner with third parties to provide [facility] patients or other authorized individuals the option of convenient, on-demand ground transportation services (the Chiropractor") through use of the technology service that enables users to request on-demand ground transportation from independent third-party providers.  By opting to use and accept these [employer], I, the undersigned, hereby agree on behalf of myself, and on behalf of any minor child using the Science writer for whom I am the parent or legal guardian, as follows:  Science writer provided to me are provided by independent third-party transportation providers who are not [employer] or employees and who are unaffiliated with [employer]. [facility] is neither a transportation carrier nor a common or public carrier. [facility] has no control over the quality or safety of the transportation that occurs as a result of the [employer]. [facility] cannot guarantee that any third-party transportation provider will complete any arranged transportation  service. [facility] makes no representation, warranty, or guarantee regarding the reliability, timeliness, quality, safety, suitability, or availability of any of the Transport Services or that they will be error free. I fully understand that traveling by vehicle involves risks and dangers of serious bodily injury, including permanent disability, paralysis, and death. I agree, on behalf of myself and on behalf of any minor child using the Transport Services for whom I am the parent or legal guardian, that the entire risk arising out of my use of the [employer] remains solely with me, to the maximum extent permitted under applicable law. The [employer] are provided "as is" and "as available." [facility] disclaims all representations and warranties, express, implied or statutory, not expressly set out in these terms, including the implied warranties of merchantability and fitness for a particular purpose. I hereby waive and release [facility], its agents, employees, officers, directors, representatives, insurers, attorneys, assigns, successors, subsidiaries, and affiliates from any and all past, present, or future claims, demands, liabilities, actions, causes of action, or suits of any kind directly or indirectly arising from acceptance and use of the [employer]. I further waive and release  and its affiliates from all present and future liability and responsibility for any injury or death to persons or damages to property caused by or related to the use of the [employer]. I have read this Waiver and Release of Liability, and I understand the terms used in it and their legal significance. This Waiver is freely and voluntarily given with the understanding that my right (as well as the right of any minor child for whom I am the parent or legal guardian using the [employer]) to legal  recourse against [facility] in connection with the Transport Services is  knowingly surrendered in return for use of these services.   I attest that I read the consent document to Karen [name], gave Ms. Lugardo the opportunity to ask questions and answered the questions asked (if any). I affirm that Karen [name] then provided consent for she's participation in this program.     Karen [name]

## 2023-04-18 NOTE — Telephone Encounter (Signed)
Patient's mom called to say she was told to have Dr. Sherrie Mustache contact Dr. Haskel Khan regarding a medical clearance for the patient to resume ECT treatments. She says Dr. Sherrie Mustache will need to send an email to Dr. Haskel Khan and say this in the email, then Dr. Haskel Khan will contact Dr. Sherrie Mustache:  This email is regarding the discussion of medical clearance for Karen [name] for ECT treatments. Send this in an email to:  [email or web address]  Questions: call Toney Reil [phone]  Mom says this needs to be done urgently so that patient can start back on treatments and this is all she was told to give Dr. Sherrie Mustache. I advised he is out sick tomorrow and not sure if it will be the rest of the week, but I will send this and if he's checking messages, it will be addressed.   Summary: ECT Treatments   Pt mom Karen [name] is calling in requesting to speak with a nurse. Per [name] she states that it is very complicated and it is in regards to contact between Dr. Sherrie Mustache and another doctor regarding pt ECT treatments. Karen [name] would like a call back from a nurse to discuss.     Reason for Disposition  [1] Other NON-URGENT information for PCP AND [2] does not require PCP response  Answer Assessment - Initial Assessment Questions 1. REASON FOR CALL or QUESTION: "What is your reason for calling today?" or "How can I best help you?" or "What question do you have that I can help answer?"     To give Dr. Sherrie Mustache message from another provider  Protocols used: PCP Call - No Triage-A-AH

## 2023-04-20 ENCOUNTER — Ambulatory Visit
Admission: RE | Admit: 2023-04-20 | Discharge: 2023-04-20 | Disposition: A | Discharge status: Home / Self Care | Payer: Medicare Other | Source: Ambulatory Visit | Attending: Urology | Admitting: Urology

## 2023-04-20 DIAGNOSIS — E041 Nontoxic single thyroid nodule: Secondary | ICD-10-CM | POA: Insufficient documentation

## 2023-04-20 DIAGNOSIS — N390 Urinary tract infection, site not specified: Secondary | ICD-10-CM | POA: Insufficient documentation

## 2023-04-20 DIAGNOSIS — E042 Nontoxic multinodular goiter: Secondary | ICD-10-CM | POA: Diagnosis not present

## 2023-04-24 NOTE — Group Note (Deleted)

## 2023-04-25 ENCOUNTER — Telehealth: Payer: Self-pay | Admitting: Family Medicine

## 2023-04-25 ENCOUNTER — Telehealth: Payer: Self-pay

## 2023-04-25 NOTE — Telephone Encounter (Signed)
Patient called stated Dr Sherrie Mustache would need to email a letter stating she is fit for Ect treatments. Email addr: [email or web address]

## 2023-04-25 NOTE — Telephone Encounter (Unsigned)
Copied from CRM [id number]. Topic: General - Other >> Apr 25, 2023  3:47 PM Franchot Heidelberg wrote: Reason for CRM: Pt called requesting for PCP to fax over a document stating that she is fit for ECT treatment   Needs to be sent to Dr. Celene Squibb at [facility]  Fax: [phone] Best contact: [phone] or [phone]

## 2023-04-25 NOTE — Telephone Encounter (Signed)
Transition Care Management Follow-up Telephone Call Date of discharge and from where: 04/11/2023 [facility]. How have you been since you were released from the hospital? Patient stated that her pain level is better now.  Any questions or concerns? No  Items Reviewed: Did the pt receive and understand the discharge instructions provided? Yes  Medications obtained and verified? Yes  Other? No  Any new allergies since your discharge? No  Dietary orders reviewed? Yes Do you have support at home? Yes   Follow up appointments reviewed:  PCP Hospital f/u appt confirmed? Yes  Scheduled to see Baker Janus, RN on 04/25/2023 @ [facility]. Specialist Hospital f/u appt confirmed? Yes  Scheduled to see Ellouise Newer. Dominic, CNM  on 04/26/2023 @ [facility] at [place]. Are transportation arrangements needed?  Patient has transportation benefit with insurance. If their condition worsens, is the pt aware to call PCP or go to the Emergency Dept.? Yes Was the patient provided with contact information for the PCP's office or ED? Yes Was to pt encouraged to call back with questions or concerns? Yes  Yann Biehn [facility]  [facility] Guide Direct Dial: [phone]  Website: [email or web address]

## 2023-04-25 NOTE — Telephone Encounter (Signed)
Pt called for lab results. Shared provider's note. The thyroid nodule seen on the previous ultrasound is stable and unchanged, which is a good sign. The radiologist recommends repeating the ultrasound in 1 year to make sure it remains stable.   Dr. Sherrie Mustache.  Written by Malva Limes, MD on 04/23/2023  8:33 AM EDT  Pt also mentioned that she has "lumps" in her throat - "like when you are sick". She also has one in her mouth.  She states they have been there for 1 year. She also states she has no appetite, and has lost a lot of weight over the past year. Pt states that she forgot to tell Dr. Sherrie Mustache about this at their last appt. Made appt  with Debera Lat for [facility].

## 2023-04-26 ENCOUNTER — Telehealth: Payer: Self-pay | Admitting: *Deleted

## 2023-04-26 ENCOUNTER — Encounter: Payer: Medicare Other | Admitting: Licensed Practical Nurse

## 2023-04-26 ENCOUNTER — Other Ambulatory Visit: Payer: Self-pay | Admitting: Family Medicine

## 2023-04-26 DIAGNOSIS — Q796 Ehlers-Danlos syndrome, unspecified: Secondary | ICD-10-CM

## 2023-04-26 MED ORDER — FENTANYL 100 MCG/HR TD PT72: 1.0000 | MEDICATED_PATCH | TRANSDERMAL | 0 refills | Status: DC

## 2023-04-26 NOTE — Telephone Encounter (Signed)
Telephone encounter was:  Successful.  04/26/2023 Name: Karen [name] MRN: [id number] DOB: Jul 11, 1976  Karen [name] is a 47 y.o. year old female who is a primary care patient of Fisher, Demetrios Isaacs, MD . The community resource team was consulted for assistance with Transportation Needs   Care guide performed the following interventions: Patient provided with information about care guide support team and interviewed to confirm resource needs. Called to schedule transportation for 04/26/2023 and 04/27/2023 explained short notice rides are not always picked up as she needs a Zenaida Niece with a lift. The 04/26/2023 canceled due to no drivers patient notified 7/82 scheduled with company  Follow Up Plan:  No further follow up planned at this time. The patient has been provided with needed resources. Karen [name] [facility]  Population Health Careguide  Direct Dial: [phone] Website: [email or web address]       Karen [name] DOB: April 10, 1976 MRN: [id number]   RIDER WAIVER AND RELEASE OF LIABILITY  For purposes of improving physical access to our facilities, [facility] is pleased to partner with third parties to provide [facility] patients or other authorized individuals the option of convenient, on-demand ground transportation services (the Chiropractor") through use of the technology service that enables users to request on-demand ground transportation from independent third-party providers.  By opting to use and accept these [employer], I, the undersigned, hereby agree on behalf of myself, and on behalf of any minor child using the Science writer for whom I am the parent or legal guardian, as follows:  Science writer provided to me are provided by independent third-party transportation providers who are not [employer] or employees and who are unaffiliated with [employer]. [facility] is neither a transportation carrier nor a common or public  carrier. [facility] has no control over the quality or safety of the transportation that occurs as a result of the [employer]. [facility] cannot guarantee that any third-party transportation provider will complete any arranged transportation service. [facility] makes no representation, warranty, or guarantee regarding the reliability, timeliness, quality, safety, suitability, or availability of any of the Transport Services or that they will be error free. I fully understand that traveling by vehicle involves risks and dangers of serious bodily injury, including permanent disability, paralysis, and death. I agree, on behalf of myself and on behalf of any minor child using the Transport Services for whom I am the parent or legal guardian, that the entire risk arising out of my use of the [employer] remains solely with me, to the maximum extent permitted under applicable law. The [employer] are provided "as is" and "as available." [facility] disclaims all representations and warranties, express, implied or statutory, not expressly set out in these terms, including the implied warranties of merchantability and fitness for a particular purpose. I hereby waive and release [facility], its agents, employees, officers, directors, representatives, insurers, attorneys, assigns, successors, subsidiaries, and affiliates from any and all past, present, or future claims, demands, liabilities, actions, causes of action, or suits of any kind directly or indirectly arising from acceptance and use of the [employer]. I further waive and release [facility] and its affiliates from all present and future liability and responsibility for any injury or death to persons or damages to property caused by or related to the use of the [employer]. I have read this Waiver and Release of Liability, and I understand the terms used in it and their legal significance.  This Waiver is freely and  voluntarily given with the understanding that my right (as well as the right of any minor child for whom I am the parent or legal guardian using the [employer]) to legal recourse against [facility] in connection with the [employer] is knowingly surrendered in return for use of these services.   I attest that I read the consent document to [name], gave Ms. Keath the opportunity to ask questions and answered the questions asked (if any). I affirm that [name] then provided consent for she's participation in this program.     Karen [name]

## 2023-04-26 NOTE — Telephone Encounter (Signed)
Pt is calling back to make sure Dr. Haskel Khan was emailed and not faxed yesterday. Please call pt back to discuss.

## 2023-04-27 ENCOUNTER — Ambulatory Visit: Payer: Medicare Other | Admitting: Physician Assistant

## 2023-04-27 ENCOUNTER — Telehealth: Payer: Self-pay

## 2023-04-27 NOTE — Telephone Encounter (Signed)
I have no idea what he would need from me. Can someone contact his office and see if they have a medical clearance form or something that they want filled out.... I haven't heart anything from his office.

## 2023-04-27 NOTE — Telephone Encounter (Signed)
Copied from CRM [id number]. Topic: General - Other >> Apr 27, 2023 11:22 AM Turkey B wrote: Reason for CRM: pt called in asking if Dr Sherrie Mustache has emailed a Dr Vevelyn Pat Zar in order for her to get her ECT

## 2023-04-28 NOTE — Telephone Encounter (Signed)
Dr. Sherrie Mustache, Called Dr. Vevelyn Pat Zar office and per [facility] patient has been cleared to have the ECT treatment. She said patient is aware. Patient just need to be patience until is her turn. She is currently on the waiting list and she had three person in front of her and now only has one.

## 2023-05-02 DIAGNOSIS — F411 Generalized anxiety disorder: Secondary | ICD-10-CM | POA: Diagnosis not present

## 2023-05-02 DIAGNOSIS — F331 Major depressive disorder, recurrent, moderate: Secondary | ICD-10-CM | POA: Diagnosis not present

## 2023-05-08 ENCOUNTER — Ambulatory Visit (INDEPENDENT_AMBULATORY_CARE_PROVIDER_SITE_OTHER): Payer: Medicare Other | Admitting: Obstetrics

## 2023-05-08 ENCOUNTER — Encounter: Payer: Self-pay | Admitting: Obstetrics

## 2023-05-08 ENCOUNTER — Other Ambulatory Visit (HOSPITAL_COMMUNITY)
Admission: RE | Admit: 2023-05-08 | Discharge: 2023-05-08 | Disposition: A | Discharge status: Home / Self Care | Payer: Medicare Other | Source: Ambulatory Visit | Attending: Licensed Practical Nurse | Admitting: Licensed Practical Nurse

## 2023-05-08 VITALS — BP 120/79 | HR 84 | Ht 59.0 in | Wt 127.8 lb

## 2023-05-08 DIAGNOSIS — Z01419 Encounter for gynecological examination (general) (routine) without abnormal findings: Secondary | ICD-10-CM | POA: Diagnosis not present

## 2023-05-08 DIAGNOSIS — Z1151 Encounter for screening for human papillomavirus (HPV): Secondary | ICD-10-CM | POA: Diagnosis not present

## 2023-05-08 DIAGNOSIS — Z7689 Persons encountering health services in other specified circumstances: Secondary | ICD-10-CM

## 2023-05-08 DIAGNOSIS — Z124 Encounter for screening for malignant neoplasm of cervix: Secondary | ICD-10-CM | POA: Insufficient documentation

## 2023-05-08 DIAGNOSIS — N938 Other specified abnormal uterine and vaginal bleeding: Secondary | ICD-10-CM

## 2023-05-08 DIAGNOSIS — N939 Abnormal uterine and vaginal bleeding, unspecified: Secondary | ICD-10-CM | POA: Diagnosis not present

## 2023-05-08 NOTE — Progress Notes (Deleted)
05/09/2023 2:04 PM   Karen [name] [birth year] [id number]  Referring provider: Malva Limes, MD [address] [place],  [place] [address]  Urological history: 1. rUTI's -Contributing factors of age, incontinence, nonambulatory, incomplete bladder emptying and constipation -CT, 04/2021 - The left kidney demonstrates moderate renal cortical scarring changes.  Marked distention of the bladder is noted. It extends well above the umbilicus. Possible bladder outlet obstruction. No bladder wall thickening or bladder mass. No bladder calculi. -Documented urine cultures over the last year  April 11, 2023 MUF   February 17, 2023 - E.coli  July 10, 2022 - E.coli   May 10, 2022 - MUF    2. Incontinence -Contributing factors of age, Ehlers-Danlos syndrome, fibromyalgia, sleep apnea, nonambulatory, asthma, depression and seizures  No chief complaint on file.   HPI: Karen [name] is a 47 y.o. female who presents today for recheck on urine with her mother, Karen [name].   Previous records reviewed.    At her visit on 04/11/2023, She is having 1-7 daytime urinations, nocturia x 1-2 with a strong urge to urinate.  She does have stress incontinence.  She is leaking 3 or more times a week.  She wears 1-2 absorbent pads daily.  She does limit fluid intake and she does engage in toilet mapping.  She states she has had two UTI's recently and continues to have symptoms.  Her symptoms consist of urgency, frequency and dysuria.   Patient denies any modifying or aggravating factors.  Patient denies any gross hematuria, dysuria or suprapubic/flank pain.  Patient denies any fevers, chills, nausea or vomiting.   UA yellow cloudy, specific gravity 1.010, pH 6.5, 0-5 WBCs, 0-2 RBCs, 0-10 epithelial cells, and amorphous sediment present and moderate bacteria.  Urine culture MUF.  PVR 0 mL   RUS (04/2023) - no hydro.     HAs an appointment with gyn on 09/23.      PMH: Past Medical  History:  Diagnosis Date   Ehlers-Danlos syndrome    Fibromyalgia    Headache    Polyp of descending colon    Sleep apnea    Tics of organic origin    Transient alteration of awareness     Surgical History: Past Surgical History:  Procedure Laterality Date   COLONOSCOPY WITH PROPOFOL N/A 02/13/2018   Procedure: COLONOSCOPY WITH PROPOFOL;  Surgeon: Midge Minium, MD;  Location: [facility] ENDOSCOPY;  Service: Endoscopy;  Laterality: N/A;   COLONOSCOPY WITH PROPOFOL N/A 01/11/2022   Procedure: COLONOSCOPY WITH PROPOFOL;  Surgeon: Midge Minium, MD;  Location: [facility] ENDOSCOPY;  Service: Endoscopy;  Laterality: N/A;   DILATION AND CURETTAGE OF UTERUS  08/16/2007   ESOPHAGOGASTRODUODENOSCOPY (EGD) WITH PROPOFOL  02/13/2018   Procedure: ESOPHAGOGASTRODUODENOSCOPY (EGD) WITH PROPOFOL;  Surgeon: Midge Minium, MD;  Location: [facility] ENDOSCOPY;  Service: Endoscopy;;   EYE SURGERY Left 08/15/1988   3 Surgeries on left eye to correct crossed eye   LAPAROSCOPY Left 08/15/2001   Fallopian Tube   MANDIBLE SURGERY  08/15/1996   OTHER SURGICAL HISTORY Right 08/15/1985   3 Surgeries to repair broken arm   OTHER SURGICAL HISTORY Left    3 surgeries on her left thigh as an infant   OTHER SURGICAL HISTORY  08/15/1996   Jaw surgery   Right arm surgery  08/15/1985   x 3 due to fracture   TOENAIL EXCISION Bilateral 06/13/2019   Procedure: BILATERAL SECOND TOE PARTIAL NAIL ABLATION;  Surgeon: Terance Hart, MD;  Location: [facility];  Service: Orthopedics;  Laterality: Bilateral;  SURGERY REQUEST TIME 1 HOUR   TONSILLECTOMY  12/13/2002   TONSILLECTOMY      Home Medications:  Allergies as of 05/09/2023       Reactions   Augmentin  [amoxicillin-pot Clavulanate] Diarrhea   Chocolate Flavor    Other reaction(s): Other (See Comments) Wheezing, acne   Demerol [meperidine] Other (See Comments)   Slow to wake up when this drug is given.    Keflex [cephalexin] Nausea And Vomiting   Morphine  And Codeine Nausea And Vomiting   Tape Dermatitis   Must use paper tape only   Toradol [ketorolac Tromethamine] Nausea And Vomiting   Amoxicillin    Other reaction(s): Unknown   Chocolate    GI distress   Nsaids    patient develops ulcers Other reaction(s): Other (See Comments) patient develops ulcers   Strawberry Extract    GI distress        Medication List        Accurate as of May 08, 2023  2:04 PM. If you have any questions, ask your nurse or doctor.          albuterol 108 (90 Base) MCG/ACT inhaler Commonly known as: Ventolin HFA Inhale 2 puffs into the lungs every 6 (six) hours as needed.   Armodafinil 150 MG tablet TAKE 1 TABLET BY MOUTH EVERY DAY   busPIRone 15 MG tablet Commonly known as: BUSPAR Take 1 tablet (15 mg total) by mouth 2 (two) times daily.   busPIRone 10 MG tablet Commonly known as: BUSPAR TAKE 1 TABLET (10 MG TOTAL) BY MOUTH 2 (TWO) TIMES DAILY. TAKE ALONG WITH 15MG  TABLET TWICE A DAY   celecoxib 200 MG capsule Commonly known as: CELEBREX Take 1 capsule (200 mg total) by mouth 2 (two) times daily.   Ciclopirox 8 % Kit Apply 1 application  topically daily.   diclofenac Sodium 1 % Gel Commonly known as: VOLTAREN APPLY AS NEEDED 3 TIMES A DAY   DULoxetine 60 MG capsule Commonly known as: CYMBALTA Take 2 capsules (120 mg total) by mouth daily.   fentaNYL 100 MCG/HR Commonly known as: DURAGESIC Place 1 patch onto the skin every 3 (three) days.   fexofenadine 180 MG tablet Commonly known as: ALLEGRA Take 180 mg by mouth daily.   gabapentin 800 MG tablet Commonly known as: NEURONTIN TAKE 1 TABLET BY MOUTH THREE TIMES A DAY   haloperidol 0.5 MG tablet Commonly known as: HALDOL TAKE 1/2 OF A TABLET (0.25 MG TOTAL) BY MOUTH TWICE A DAY   lurasidone 80 MG Tabs tablet Commonly known as: Latuda Take 1 tablet (80 mg total) by mouth daily with supper.   mometasone 0.1 % cream Commonly known as: ELOCON Apply to rash BID up  to two weeks.   naloxone 4 MG/0.1ML Liqd nasal spray kit Commonly known as: NARCAN Place 1 spray into the nose as needed.   nitrofurantoin (macrocrystal-monohydrate) 100 MG capsule Commonly known as: MACROBID Take 1 capsule (100 mg total) by mouth every 12 (twelve) hours.   omeprazole 40 MG capsule Commonly known as: PRILOSEC Take 1 capsule (40 mg total) by mouth 2 (two) times daily.   ondansetron 4 MG disintegrating tablet Commonly known as: ZOFRAN-ODT Take 1 tablet (4 mg total) by mouth every 8 (eight) hours as needed for nausea or vomiting. DISSOLVE 1 TABLET IN MOUTH EVERY 8 HOURS FOR NAUSEA AND VOMITING   QUEtiapine 100 MG tablet Commonly known as: SEROQUEL Take 1 tablet (100  mg total) by mouth at bedtime.   sulfamethoxazole-trimethoprim 800-160 MG tablet Commonly known as: BACTRIM DS Take 1 tablet by mouth 2 (two) times daily.        Allergies:  Allergies  Allergen Reactions   Augmentin  [Amoxicillin-Pot Clavulanate] Diarrhea   Chocolate Flavor     Other reaction(s): Other (See Comments) Wheezing, acne   Demerol [Meperidine] Other (See Comments)    Slow to wake up when this drug is given.    Keflex [Cephalexin] Nausea And Vomiting   Morphine And Codeine Nausea And Vomiting   Tape Dermatitis    Must use paper tape only   Toradol [Ketorolac Tromethamine] Nausea And Vomiting   Amoxicillin     Other reaction(s): Unknown   Chocolate     GI distress   Nsaids     patient develops ulcers Other reaction(s): Other (See Comments) patient develops ulcers   Strawberry Extract     GI distress    Family History: Family History  Problem Relation Age of Onset   Depression Mother    Stroke Mother    Fibromyalgia Mother    Osteoarthritis Mother    Asthma Brother    Depression Brother    Migraines Brother    Asperger's syndrome Brother    Other Brother        Ehlers-Danlos Syndrome   Cancer Maternal Aunt    Asperger's syndrome Maternal Aunt    Breast cancer  Maternal Aunt    Heart disease Paternal Aunt    Heart disease Paternal Uncle    Cervical cancer Maternal Grandmother    Osteoporosis Maternal Grandmother        Died at 35   Osteoarthritis Maternal Grandmother    Colon cancer Maternal Grandfather    Asthma Maternal Grandfather    Asperger's syndrome Maternal Grandfather    Emphysema Maternal Grandfather        Died at 65   Prostate cancer Maternal Grandfather    Heart attack Paternal Grandfather        Died at 41   Asthma Paternal Grandfather    Tuberculosis Paternal Grandfather    Cancer Cousin    Asperger's syndrome Cousin    Asperger's syndrome Other    Heart Problems Paternal Grandmother        Died at 28   Ehlers-Danlos syndrome Father    Ehlers-Danlos syndrome Brother     Social History:  reports that she has never smoked. She has never used smokeless tobacco. She reports that she does not drink alcohol and does not use drugs.  ROS: Pertinent ROS in HPI  Physical Exam: LMP 04/26/2023 (Approximate)   Constitutional:  Well nourished. Alert and oriented, No acute distress. HEENT: [place] AT, moist mucus membranes.  Trachea midline, no masses. Cardiovascular: No clubbing, cyanosis, or edema. Respiratory: Normal respiratory effort, no increased work of breathing. GU: No CVA tenderness.  No bladder fullness or masses.  Recession of labia minora, dry, pale vulvar vaginal mucosa and loss of mucosal ridges and folds.  Normal urethral meatus, no lesions, no prolapse, no discharge.   No urethral masses, tenderness and/or tenderness. No bladder fullness, tenderness or masses. *** vagina mucosa, *** estrogen effect, no discharge, no lesions, *** pelvic support, *** cystocele and *** rectocele noted.  No cervical motion tenderness.  Uterus is freely mobile and non-fixed.  No adnexal/parametria masses or tenderness noted.  Anus and perineum are without rashes or lesions.   ***  Neurologic: Grossly intact, no focal deficits, moving all 4  extremities. Psychiatric: Normal mood and affect.    Laboratory Data: CBC    Component Value Date/Time   WBC 6.8 04/11/2023 1847   RBC 4.46 04/11/2023 1847   HGB 13.0 04/11/2023 1847   HGB 12.0 08/02/2022 1508   HCT 39.1 04/11/2023 1847   HCT 36.4 08/02/2022 1508   PLT 340 04/11/2023 1847   PLT 277 08/02/2022 1508   MCV 87.7 04/11/2023 1847   MCV 85 08/02/2022 1508   MCV 92 02/19/2014 0035   MCH 29.1 04/11/2023 1847   MCHC 33.2 04/11/2023 1847   RDW 13.1 04/11/2023 1847   RDW 13.0 08/02/2022 1508   RDW 13.8 02/19/2014 0035   LYMPHSABS 2.1 08/02/2022 1508   LYMPHSABS 3.5 02/19/2014 0035   MONOABS 0.4 07/10/2022 2038   MONOABS 0.5 02/19/2014 0035   EOSABS 0.4 08/02/2022 1508   EOSABS 0.9 (H) 02/19/2014 0035   BASOSABS 0.0 08/02/2022 1508   BASOSABS 0.1 02/19/2014 0035  I have reviewed the labs.  Pertinent Imaging N/A   Assessment & Plan:    1. rUTI's -she has continued to experience symptoms of frequency, urgency and dysuria despite two rounds of culture appropriate antibiotics -UA today is unremarkable -urine sent for culture to rule out indolent infection-Macrobid sent to pharmacy as patient was adamant about starting an antibiotic even though I explained the risks of further antibiotic use in the setting of negative cultures  -RUS ordered for further evaluation of symptoms -may consider cysto pending culture results and RUS results -also may be symptoms of GSM   2. Incontinence -Back at baseline symptoms -Does not want to pursue further treatment of incontinence at this time due to fear of a return of her psychosis -may be contributing to LU TS if sitting in wet depends for long periods of time -will need to perform pelvic exam if RUS results or urine culture do not explain her symptoms   No follow-ups on file.  These notes generated with voice recognition software. I apologize for typographical errors.  Cloretta Ned  [facility] [address]  [address] [place], [place] [address] [phone]

## 2023-05-08 NOTE — Progress Notes (Signed)
GYN ENCOUNTER  Subjective  HPI: Karen [name] is a 47 y.o. G0P0000 who presents today for abnormal uterine bleeding. She has a history of irregular periods, but they have become more irregular recently. She is also experiencing hot flashes and irritability. Around age 42-44, she did not get a period, but then she began having periods again. They last anywhere from 2 days of heavy bleeding to 3 weeks of intermittent bleeding. She has never been sexually active. She denies a h/o fibroids but does report a cyst on her Fallopian tube in her 11s. Her last Pap smear was in 2019.  Past Medical History:  Diagnosis Date   Ehlers-Danlos syndrome    Fibromyalgia    Headache    Polyp of descending colon    Sleep apnea    Tics of organic origin    Transient alteration of awareness    Past Surgical History:  Procedure Laterality Date   COLONOSCOPY WITH PROPOFOL N/A 02/13/2018   Procedure: COLONOSCOPY WITH PROPOFOL;  Surgeon: Midge Minium, MD;  [facility] END[facility];  Service: Endoscopy;  Laterality: N/A;   COLONOSCOPY WITH PROPOFOL N/A 01/11/2022   Procedure: COLONOSCOPY WITH PROPOFOL;  Surgeon: Midge Minium, MD;  Location: [facility] ENDOSCOPY;  Service: Endoscopy;  Laterality: N/A;   DILATION AND CURETTAGE OF UTERUS  08/16/2007   ESOPHAGOGASTRODUODENOSCOPY (EGD) WITH PROPOFOL  02/13/2018   Procedure: ESOPHAGOGASTRODUODENOSCOPY (EGD) WITH PROPOFOL;  Surgeon: Midge Minium, MD;  Location: [facility] ENDOSCOPY;  Service: Endoscopy;;   EYE SURGERY Left 08/15/1988   3 Surgeries on left eye to correct crossed eye   LAPAROSCOPY Left 08/15/2001   Fallopian Tube   MANDIBLE SURGERY  08/15/1996   OTHER SURGICAL HISTORY Right 08/15/1985   3 Surgeries to repair broken arm   OTHER SURGICAL HISTORY Left    3 surgeries on her left thigh as an infant   OTHER SURGICAL HISTORY  08/15/1996   Jaw surgery   Right arm surgery  08/15/1985   x 3 due to fracture   TOENAIL EXCISION Bilateral 06/13/2019   Procedure:  BILATERAL SECOND TOE PARTIAL NAIL ABLATION;  Surgeon: Terance Hart, MD;  Location: [facility];  Service: Orthopedics;  Laterality: Bilateral;  SURGERY REQUEST TIME 1 HOUR   TONSILLECTOMY  12/13/2002   TONSILLECTOMY     OB History     Gravida  0   Para  0   Term  0   Preterm  0   AB  0   Living  0      SAB  0   IAB  0   Ectopic  0   Multiple  0   Live Births             Allergies  Allergen Reactions   Augmentin  [Amoxicillin-Pot Clavulanate] Diarrhea   Chocolate Flavor     Other reaction(s): Other (See Comments) Wheezing, acne   Demerol [Meperidine] Other (See Comments)    Slow to wake up when this drug is given.    Keflex [Cephalexin] Nausea And Vomiting   Morphine And Codeine Nausea And Vomiting   Tape Dermatitis    Must use paper tape only   Toradol [Ketorolac Tromethamine] Nausea And Vomiting   Amoxicillin     Other reaction(s): Unknown   Chocolate     GI distress   Nsaids     patient develops ulcers Other reaction(s): Other (See Comments) patient develops ulcers   Strawberry Extract     GI distress    Constitutional: +  hot flashes, weight loss.  Genitourinary: Denied genitourinary symptoms including symptomatic vaginal discharge, pelvic relaxation issues, and urinary complaints.    Objective  BP 120/79   Pulse 84   Ht 4\' 11"  (1.499 m)   Wt 127 lb 12.8 oz (58 kg)   LMP 04/26/2023 (Approximate)   BMI 25.81 kg/m   Physical examination   Pelvic:   Vulva: Normal appearance.  No lesions.  Vagina: No lesions or abnormalities noted.  Cervix: Normal appearance.  No lesions. Pap collected.  Perineum: Normal exam.  No lesions.    Assessment  -Abnormal uterine bleeding -Due for Pap  Plan  -Discussed possible causes of abnormal bleeding, including perimenopause, fibroids, thyroid dysfunction, ovarian cysts, cervical polyps. FSH, TSH, and pelvic US ordered. Consider OCPs for cycle control if no other cause of  dysfunctional bleeding identified. -Pap collected.  Follow up based on results.  Guadlupe Spanish, CNM

## 2023-05-09 ENCOUNTER — Telehealth: Payer: Self-pay

## 2023-05-09 ENCOUNTER — Ambulatory Visit (INDEPENDENT_AMBULATORY_CARE_PROVIDER_SITE_OTHER): Payer: Medicare Other | Admitting: Physician Assistant

## 2023-05-09 ENCOUNTER — Ambulatory Visit: Payer: Medicare Other | Admitting: Urology

## 2023-05-09 ENCOUNTER — Encounter: Payer: Self-pay | Admitting: Physician Assistant

## 2023-05-09 VITALS — BP 105/71 | HR 71

## 2023-05-09 DIAGNOSIS — J029 Acute pharyngitis, unspecified: Secondary | ICD-10-CM | POA: Diagnosis not present

## 2023-05-09 DIAGNOSIS — N3946 Mixed incontinence: Secondary | ICD-10-CM

## 2023-05-09 DIAGNOSIS — F331 Major depressive disorder, recurrent, moderate: Secondary | ICD-10-CM

## 2023-05-09 DIAGNOSIS — J3089 Other allergic rhinitis: Secondary | ICD-10-CM | POA: Diagnosis not present

## 2023-05-09 DIAGNOSIS — N390 Urinary tract infection, site not specified: Secondary | ICD-10-CM

## 2023-05-09 LAB — FOLLICLE STIMULATING HORMONE: FSH: 17.8 m[IU]/mL

## 2023-05-09 LAB — TSH+FREE T4
Free T4: 0.84 ng/dL (ref 0.82–1.77)
TSH: 0.865 u[IU]/mL (ref 0.450–4.500)

## 2023-05-09 NOTE — Telephone Encounter (Signed)
Telephone encounter was:  Unsuccessful.  05/09/2023 Name: BETTE SWATZELL MRN: [id number] DOB: 1975-11-01  Unsuccessful outbound call made today to assist with:  Transportation Needs   Outreach Attempt:  1st Attempt  Unable to leave message regarding transportation, voicemail is full.  Amish Mintzer [facility]  [facility], [facility] Guide Direct Dial: [phone]  Website: [email or web address]

## 2023-05-09 NOTE — Progress Notes (Signed)
Established patient visit  Patient: Karen [name]   DOB: 04/24/1976   47 y.o. Female  MRN: [id number] Visit Date: 05/09/2023  Today's healthcare provider: Debera Lat, PA-C   Chief Complaint  Patient presents with   Sore Throat   Subjective     Discussed the use of AI scribe software for clinical note transcription with the patient, who gave verbal consent to proceed.  History of Present Illness   The patient, with a history of depression, anxiety, and brain damage due to a UTI, presents with worsening depression and anxiety. They have been receiving ECT every four weeks, but the last session was on June 10th. The patient reports that their depression and anxiety levels are "not good". They are awaiting their next ECT session, but do not know when it will be scheduled.  In addition to their mental health concerns, the patient has been experiencing recurrent sore throat for the past year, mostly on one side. The sore throat has been on and off, with the last episode occurring a couple of weeks ago. The patient has been using antihistamines and Flonase for relief, but the symptoms persist.           05/09/2023    2:50 PM 04/14/2023    1:23 PM 03/03/2023    1:31 PM  Depression screen PHQ 2/9  Decreased Interest 1 0 1  Down, Depressed, Hopeless 2 1 1   PHQ - 2 Score 3 1 2   Altered sleeping 1 3 0  Tired, decreased energy 3 3 1   Change in appetite 3 2 3   Feeling bad or failure about yourself  3 2 1   Trouble concentrating 3 3 1   Moving slowly or fidgety/restless 0 0 0  Suicidal thoughts 0 0 0  PHQ-9 Score 16 14 8   Difficult doing work/chores  Very difficult Not difficult at all      05/09/2023    2:50 PM 04/14/2023    1:23 PM 10/31/2022    3:46 PM 11/25/2021   10:07 AM  GAD 7 : Generalized Anxiety Score  Nervous, Anxious, on Edge 3 1 3    Control/stop worrying 3 3 0   Worry too much - different things 3 3 1    Trouble relaxing 0 3 1   Restless 0 1 3   Easily annoyed or  irritable 3 3 0   Afraid - awful might happen 2 0 0   Total GAD 7 Score 14 14 8    Anxiety Difficulty  Very difficult Not difficult at all      Information is confidential and restricted. Go to Review Flowsheets to unlock data.    Medications: Outpatient Medications Prior to Visit  Medication Sig   albuterol (VENTOLIN HFA) 108 (90 Base) MCG/ACT inhaler Inhale 2 puffs into the lungs every 6 (six) hours as needed.   Armodafinil 150 MG tablet TAKE 1 TABLET BY MOUTH EVERY DAY   busPIRone (BUSPAR) 10 MG tablet TAKE 1 TABLET (10 MG TOTAL) BY MOUTH 2 (TWO) TIMES DAILY. TAKE ALONG WITH 15MG  TABLET TWICE A DAY   busPIRone (BUSPAR) 15 MG tablet Take 1 tablet (15 mg total) by mouth 2 (two) times daily.   celecoxib (CELEBREX) 200 MG capsule Take 1 capsule (200 mg total) by mouth 2 (two) times daily.   Ciclopirox 8 % KIT Apply 1 application  topically daily.   diclofenac Sodium (VOLTAREN) 1 % GEL APPLY AS NEEDED 3 TIMES A DAY   DULoxetine (CYMBALTA) 60 MG capsule Take 2 capsules (120  mg total) by mouth daily.   fentaNYL (DURAGESIC) 100 MCG/HR Place 1 patch onto the skin every 3 (three) days.   fexofenadine (ALLEGRA) 180 MG tablet Take 180 mg by mouth daily.   gabapentin (NEURONTIN) 800 MG tablet TAKE 1 TABLET BY MOUTH THREE TIMES A DAY   haloperidol (HALDOL) 0.5 MG tablet TAKE 1/2 OF A TABLET (0.25 MG TOTAL) BY MOUTH TWICE A DAY   lurasidone (LATUDA) 80 MG TABS tablet Take 1 tablet (80 mg total) by mouth daily with supper.   mometasone (ELOCON) 0.1 % cream Apply to rash BID up to two weeks.   naloxone (NARCAN) nasal spray 4 mg/0.1 mL Place 1 spray into the nose as needed.   omeprazole (PRILOSEC) 40 MG capsule Take 1 capsule (40 mg total) by mouth 2 (two) times daily.   ondansetron (ZOFRAN-ODT) 4 MG disintegrating tablet Take 1 tablet (4 mg total) by mouth every 8 (eight) hours as needed for nausea or vomiting. DISSOLVE 1 TABLET IN MOUTH EVERY 8 HOURS FOR NAUSEA AND VOMITING   QUEtiapine (SEROQUEL) 100  MG tablet Take 1 tablet (100 mg total) by mouth at bedtime.   [DISCONTINUED] nitrofurantoin, macrocrystal-monohydrate, (MACROBID) 100 MG capsule Take 1 capsule (100 mg total) by mouth every 12 (twelve) hours. (Patient not taking: Reported on 05/09/2023)   [DISCONTINUED] sulfamethoxazole-trimethoprim (BACTRIM DS) 800-160 MG tablet Take 1 tablet by mouth 2 (two) times daily. (Patient not taking: Reported on 05/09/2023)   No facility-administered medications prior to visit.    Review of Systems  All other systems reviewed and are negative.  Except see HPI       Objective    BP 105/71 (BP Location: Left Arm, Patient Position: Sitting, Cuff Size: Normal)   Pulse 71   LMP 04/26/2023 (Approximate)     Physical Exam Vitals reviewed.  Constitutional:      General: She is not in acute distress.    Appearance: Normal appearance. She is well-developed. She is not diaphoretic.  HENT:     Head: Normocephalic and atraumatic.     Right Ear: Ear canal and external ear normal.     Left Ear: Ear canal and external ear normal.     Ears:     Comments: Fluids behind the tms    Nose: Congestion and rhinorrhea present.     Mouth/Throat:     Pharynx: No oropharyngeal exudate.     Comments: Postnasal drainage Cannot visualize tonsils and pharynx due to strong gag reflex Eyes:     General: No scleral icterus.       Right eye: No discharge.        Left eye: No discharge.     Extraocular Movements: Extraocular movements intact.     Conjunctiva/sclera: Conjunctivae normal.     Pupils: Pupils are equal, round, and reactive to light.  Neck:     Thyroid: No thyromegaly.  Cardiovascular:     Rate and Rhythm: Normal rate and regular rhythm.     Pulses: Normal pulses.     Heart sounds: Normal heart sounds. No murmur heard. Pulmonary:     Effort: Pulmonary effort is normal. No respiratory distress.     Breath sounds: Normal breath sounds. No wheezing, rhonchi or rales.  Musculoskeletal:     Cervical  back: Normal range of motion and neck supple. No tenderness.     Right lower leg: No edema.     Left lower leg: No edema.  Lymphadenopathy:     Cervical: Cervical adenopathy present.  Skin:    General: Skin is warm and dry.     Findings: No rash.  Neurological:     Mental Status: She is alert and oriented to person, place, and time. Mental status is at baseline.  Psychiatric:        Behavior: Behavior normal.        Thought Content: Thought content normal.        Judgment: Judgment normal.      No results found for any visits on 05/09/23.  Assessment & Plan        Major Depressive Disorder Patient reports worsening depression and anxiety, likely due to delay in scheduled electroconvulsive therapy (ECT). ECT is typically performed every four weeks, but the patient has not received treatment since June 10th. -Encouraged patient to contact ECT provider daily to request scheduling.  Recurrent Throat Infections Patient reports recurrent throat infections over the past six months, with the most recent episode occurring two weeks ago. Physical examination revealed enlarged lymph nodes. -Refer to Ear, Nose, and Throat (ENT) specialist for further evaluation and management.  Allergic Rhinitis Patient reports ongoing nasal congestion and has been using Flonase and antihistamines (Claritin and Allegra) with limited relief. -Advise patient to use saline nasal spray prior to Flonase to improve medication delivery. -Consider switching antihistamine due to possible tolerance development.     No follow-ups on file.     The patient was advised to call back or seek an in-person evaluation if the symptoms worsen or if the condition fails to improve as anticipated.  I discussed the assessment and treatment plan with the patient. The patient was provided an opportunity to ask questions and all were answered. The patient agreed with the plan and demonstrated an understanding of the  instructions.  I, Debera Lat, PA-C have reviewed all documentation for this visit. The documentation on  05/09/23 for the exam, diagnosis, procedures, and orders are all accurate and complete.  Debera Lat, [facility], MMS [facility] [phone] (phone) [phone] (fax)  [facility]

## 2023-05-10 ENCOUNTER — Telehealth: Payer: Self-pay

## 2023-05-10 LAB — CYTOLOGY - PAP
Adequacy: ABSENT
Comment: NEGATIVE
Diagnosis: NEGATIVE
High risk HPV: NEGATIVE

## 2023-05-10 NOTE — Telephone Encounter (Signed)
Telephone encounter was:  Successful.  05/10/2023 Name: AIDEL BINGAMAN MRN: [id number] DOB: June 04, 1976  CAPRESHA BECKEY is a 47 y.o. year old female who is a primary care patient of Sherrie Mustache, Demetrios Isaacs, MD . The community resource team was consulted for assistance with Transportation Needs   Care guide performed the following interventions: Spoke with patient's mother to arrange transportation for 05/11/23. Patient was unavailable to come to the phone. Informed her that I spoke with Marcelino Duster at [place] and an Nature conservation officer would be coming to the home to make sure the Zenaida Niece could get in and out.  She responded that he came yesterday and she is aware that the patient will need to call at least a week prior to appointments to schedule a ride. Gave her the number to call [facility] [phone].  Follow Up Plan:   I will call the patient once I receive confirmation from [facility].  05/10/2023  [name] DOB: [birth year] MRN: [id number]   RIDER WAIVER AND RELEASE OF LIABILITY  For the purposes of helping with transportation needs, [facility] partners with outside transportation providers (taxi companies, [place], Catering manager.) to give [employer] patients or other approved people the choice of on-demand rides [employer]") to our buildings for non-emergency visits.  By using [employer], I, the person signing this document, on behalf of myself and/or any legal minors (in my care using the [employer]), agree:  Science writer given to me are supplied by independent, outside transportation providers who do not work for, or have any affiliation with, [employer]. [facility] is not a transportation company. [facility] has no control over the quality or safety of the rides I get using [employer]. [facility] has no control over whether any outside ride will happen on time or not. [facility] gives no guarantee on the reliability, quality, safety, or availability on any rides,  or that no mistakes will happen. I know and accept that traveling by vehicle (car, truck, SVU, Zenaida Niece, bus, taxi, etc.) has risks of serious injuries such as disability, being paralyzed, and death. I know and agree the risk of using [employer] is mine alone, and not [employer]. Transport Services are provided "as is" and as are available. The transportation providers are in charge for all inspections and care of the vehicles used to provide these rides. I agree not to take legal action against [facility], its agents, employees, officers, directors, representatives, insurers, attorneys, assigns, successors, subsidiaries, and affiliates at any time for any reasons related directly or indirectly to using [employer]. I also agree not to take legal action against [facility] or its affiliates for any injury, death, or damage to property caused by or related to using [employer]. I have read this Waiver and Release of Liability, and I understand the terms used in it and their legal meaning. This Waiver is freely and voluntarily given with the understanding that my right (or any legal minors) to legal action against [place] [facility] relating to [employer] is knowingly given up to use these services.   I attest that I read the Ride Waiver and Release of Liability to [name], gave Ms. Lorincz the opportunity to ask questions and answered the questions asked (if any). I affirm that [name] then provided consent for assistance with transportation.     Zuhayr Deeney D Pernell Dupre  Rhyker Silversmith [facility]  [facility], [facility] Guide Direct Dial: [phone]  Website: [email or web address]

## 2023-05-10 NOTE — Telephone Encounter (Signed)
Telephone encounter was:  Unsuccessful.  05/10/2023 Name: ARAIYA TRAMONTE MRN: [id number] DOB: 07-25-1976  Unsuccessful outbound call made today to assist with:  Transportation Needs   Outreach Attempt:  2nd Attempt  Unable to leave message regarding transportation, voicemail is full.  Aimee Heldman [facility]  [facility], [facility] Guide Direct Dial: [phone]  Website: [email or web address]

## 2023-05-10 NOTE — Telephone Encounter (Signed)
Telephone encounter was:  Successful.  05/10/2023 Name: STASSI CARDOSI MRN: [id number] DOB: [birth year]  KATEY ARCH is a 47 y.o. year old female who is a primary care patient of Sherrie Mustache, Demetrios Isaacs, MD . The community resource team was consulted for assistance with Transportation Needs   Care guide performed the following interventions: Called patient to inform her of pickup time tomorrow 9/26 at 2:30pm by [employer]. The driver will give the patient a card with the number to call for pickup after her appointment.   Follow Up Plan:  No further follow up planned at this time. The patient has been provided with needed resources.      Makani Seckman [facility]  [facility], [facility] Guide Direct Dial: [phone]  Website: [email or web address]

## 2023-05-11 ENCOUNTER — Ambulatory Visit
Admission: RE | Admit: 2023-05-11 | Discharge: 2023-05-11 | Disposition: A | Discharge status: Home / Self Care | Payer: Medicare Other | Source: Ambulatory Visit | Attending: Obstetrics | Admitting: Obstetrics

## 2023-05-11 ENCOUNTER — Encounter: Payer: Self-pay | Admitting: Obstetrics

## 2023-05-11 DIAGNOSIS — N938 Other specified abnormal uterine and vaginal bleeding: Secondary | ICD-10-CM | POA: Insufficient documentation

## 2023-05-11 DIAGNOSIS — D259 Leiomyoma of uterus, unspecified: Secondary | ICD-10-CM | POA: Diagnosis not present

## 2023-05-11 DIAGNOSIS — N939 Abnormal uterine and vaginal bleeding, unspecified: Secondary | ICD-10-CM | POA: Diagnosis not present

## 2023-05-24 ENCOUNTER — Telehealth: Payer: Self-pay | Admitting: *Deleted

## 2023-05-24 NOTE — Telephone Encounter (Signed)
   Telephone encounter was:  Successful.  05/24/2023 Name: [name] MRN: [id number] DOB: 03/21/1976  Karen [name] is a 47 y.o. year old female who is a primary care patient of Sherrie Mustache, Demetrios Isaacs, MD . The community resource team was consulted for assistance with Transportation Needs  Patient called to schedule transportation for friday advised we would not be able to schedule presently as this appt is with [facility] , patient would need to find transportation through them , did provide community transportation in [facility] guide performed the following interventions: Patient provided with information about care guide support team and interviewed to confirm resource needs.  Follow Up Plan:  No further follow up planned at this time. The patient has been provided with needed resources.  [name] [facility]  Population Health Careguide  Direct Dial: [phone] Website: [email or web address]

## 2023-05-24 NOTE — Telephone Encounter (Signed)
Encounter Error: Please Disregard

## 2023-05-26 ENCOUNTER — Ambulatory Visit (INDEPENDENT_AMBULATORY_CARE_PROVIDER_SITE_OTHER): Payer: Medicare Other | Admitting: Family Medicine

## 2023-05-26 ENCOUNTER — Encounter: Payer: Self-pay | Admitting: Family Medicine

## 2023-05-26 ENCOUNTER — Other Ambulatory Visit: Payer: Self-pay | Admitting: Family Medicine

## 2023-05-26 VITALS — BP 117/79 | HR 99

## 2023-05-26 DIAGNOSIS — Z20822 Contact with and (suspected) exposure to covid-19: Secondary | ICD-10-CM | POA: Diagnosis not present

## 2023-05-26 NOTE — Telephone Encounter (Signed)
Requested Prescriptions  Pending Prescriptions Disp Refills   DULoxetine (CYMBALTA) 60 MG capsule [Pharmacy Med Name: DULOXETINE HCL DR 60 MG CAP] 180 capsule 0    Sig: TAKE 2 CAPSULES BY MOUTH DAILY     Psychiatry: Antidepressants - SNRI - duloxetine Failed - 05/26/2023  2:30 AM      Failed - Cr in normal range and within 360 days    Creatinine  Date Value Ref Range Status  02/19/2014 0.82 0.60 - 1.30 mg/dL Final   Creatinine, Ser  Date Value Ref Range Status  08/02/2022 1.06 (H) 0.57 - 1.00 mg/dL Final         Passed - eGFR is 30 or above and within 360 days    EGFR (African American)  Date Value Ref Range Status  02/19/2014 >60  Final   GFR calc Af Amer  Date Value Ref Range Status  10/15/2019 80 >59 mL/min/1.73 Final   EGFR (Non-African Amer.)  Date Value Ref Range Status  02/19/2014 >60  Final    Comment:    eGFR values <72mL/min/1.73 m2 may be an indication of chronic kidney disease (CKD). Calculated eGFR is useful in patients with stable renal function. The eGFR calculation will not be reliable in acutely ill patients when serum creatinine is changing rapidly. It is not useful in  patients on dialysis. The eGFR calculation may not be applicable to patients at the low and high extremes of body sizes, pregnant women, and vegetarians.    GFR, Estimated  Date Value Ref Range Status  07/10/2022 >60 >60 mL/min Final    Comment:    (NOTE) Calculated using the CKD-EPI Creatinine Equation (2021)    eGFR  Date Value Ref Range Status  08/02/2022 66 >59 mL/min/1.73 Final         Passed - Completed PHQ-2 or PHQ-9 in the last 360 days      Passed - Last BP in normal range    BP Readings from Last 1 Encounters:  05/09/23 105/71         Passed - Valid encounter within last 6 months    Recent Outpatient Visits           2 weeks ago Sore throat   [facility] [place], [place], PA-C   1 month ago Thyroid nodule   [facility] Malva Limes, MD   2 months ago Acute cystitis with hematuria    [facility] [place], [place], PA-C   3 months ago Acute pharyngitis, unspecified etiology   [facility] Alfredia Ferguson, PA-C   5 months ago Hypersomnia   [facility] Malva Limes, MD       Future Appointments             Today Jacky Kindle, FNP [facility], [facility]   In 3 weeks Deirdre Evener, MD [facility]

## 2023-05-26 NOTE — Progress Notes (Signed)
Pt presents for PCR Covid test. No symptoms. Request for testing for planned ECT.

## 2023-05-29 ENCOUNTER — Other Ambulatory Visit: Payer: Self-pay | Admitting: Family Medicine

## 2023-05-29 DIAGNOSIS — F29 Unspecified psychosis not due to a substance or known physiological condition: Secondary | ICD-10-CM | POA: Diagnosis not present

## 2023-05-29 DIAGNOSIS — F333 Major depressive disorder, recurrent, severe with psychotic symptoms: Secondary | ICD-10-CM | POA: Diagnosis not present

## 2023-05-29 DIAGNOSIS — R569 Unspecified convulsions: Secondary | ICD-10-CM | POA: Diagnosis not present

## 2023-05-29 DIAGNOSIS — Z881 Allergy status to other antibiotic agents status: Secondary | ICD-10-CM | POA: Diagnosis not present

## 2023-05-29 DIAGNOSIS — Z885 Allergy status to narcotic agent status: Secondary | ICD-10-CM | POA: Diagnosis not present

## 2023-05-29 DIAGNOSIS — G4710 Hypersomnia, unspecified: Secondary | ICD-10-CM | POA: Diagnosis not present

## 2023-05-29 DIAGNOSIS — Z880 Allergy status to penicillin: Secondary | ICD-10-CM | POA: Diagnosis not present

## 2023-05-29 DIAGNOSIS — M5136 Other intervertebral disc degeneration, lumbar region with discogenic back pain only: Secondary | ICD-10-CM

## 2023-05-29 LAB — SPECIMEN STATUS REPORT

## 2023-05-29 LAB — NOVEL CORONAVIRUS, NAA: SARS-CoV-2, NAA: NOT DETECTED

## 2023-05-29 MED ORDER — FENTANYL 100 MCG/HR TD PT72: 1.0000 | MEDICATED_PATCH | TRANSDERMAL | 0 refills | Status: DC

## 2023-06-01 ENCOUNTER — Telehealth: Payer: Self-pay | Admitting: Family Medicine

## 2023-06-01 DIAGNOSIS — M5136 Other intervertebral disc degeneration, lumbar region with discogenic back pain only: Secondary | ICD-10-CM

## 2023-06-01 NOTE — Telephone Encounter (Signed)
Medication Refill - Medication: fentaNYL (DURAGESIC) 100 MCG/HR [[id number]]   celecoxib (CELEBREX) 200 MG capsule [[id number]]    Has the patient contacted their pharmacy? Yes.   (Agent: If no, request that the patient contact the pharmacy for the refill. If patient does not wish to contact the pharmacy document the reason why and proceed with request.) (Agent: If yes, when and what did the pharmacy advise?)  Preferred Pharmacy (with phone number or street name): [id number] [place], [place] [address] Phone: [phone]  Fax: [phone]   Has the patient been seen for an appointment in the last year OR does the patient have an upcoming appointment? Yes.    Agent: Please be advised that RX refills may take up to 3 business days. We ask that you follow-up with your pharmacy.

## 2023-06-01 NOTE — Telephone Encounter (Signed)
Requested medications are due for refill today.  No  Requested medications are on the active medications list.  yes  Last refill. Celebrex refilled 02/27/2023 #60 5 rf, Fentanyl 05/29/2023 #10  Future visit scheduled.   no  Notes to clinic.  Too soon for Fentanyl - refill/refusal not delegated. Pt wants Celebrex sent to a different pharmacy.    Requested Prescriptions  Pending Prescriptions Disp Refills   celecoxib (CELEBREX) 200 MG capsule 60 capsule 5    Sig: Take 1 capsule (200 mg total) by mouth 2 (two) times daily.     Analgesics:  COX2 Inhibitors Failed - 06/01/2023  4:25 PM      Failed - Manual Review: Labs are only required if the patient has taken medication for more than 8 weeks.      Failed - Cr in normal range and within 360 days    Creatinine  Date Value Ref Range Status  02/19/2014 0.82 0.60 - 1.30 mg/dL Final   Creatinine, Ser  Date Value Ref Range Status  08/02/2022 1.06 (H) 0.57 - 1.00 mg/dL Final         Passed - HGB in normal range and within 360 days    Hemoglobin  Date Value Ref Range Status  04/11/2023 13.0 12.0 - 15.0 g/dL Final  16/05/9603 54.0 11.1 - 15.9 g/dL Final         Passed - HCT in normal range and within 360 days    HCT  Date Value Ref Range Status  04/11/2023 39.1 36.0 - 46.0 % Final   Hematocrit  Date Value Ref Range Status  08/02/2022 36.4 34.0 - 46.6 % Final         Passed - AST in normal range and within 360 days    AST  Date Value Ref Range Status  08/02/2022 15 0 - 40 IU/L Final   SGOT(AST)  Date Value Ref Range Status  02/19/2014 20 15 - 37 Unit/L Final         Passed - ALT in normal range and within 360 days    ALT  Date Value Ref Range Status  08/02/2022 10 0 - 32 IU/L Final   SGPT (ALT)  Date Value Ref Range Status  02/19/2014 27 12 - 78 U/L Final         Passed - eGFR is 30 or above and within 360 days    EGFR (African American)  Date Value Ref Range Status  02/19/2014 >60  Final   GFR calc Af Amer   Date Value Ref Range Status  10/15/2019 80 >59 mL/min/1.73 Final   EGFR (Non-African Amer.)  Date Value Ref Range Status  02/19/2014 >60  Final    Comment:    eGFR values <69mL/min/1.73 m2 may be an indication of chronic kidney disease (CKD). Calculated eGFR is useful in patients with stable renal function. The eGFR calculation will not be reliable in acutely ill patients when serum creatinine is changing rapidly. It is not useful in  patients on dialysis. The eGFR calculation may not be applicable to patients at the low and high extremes of body sizes, pregnant women, and vegetarians.    GFR, Estimated  Date Value Ref Range Status  07/10/2022 >60 >60 mL/min Final    Comment:    (NOTE) Calculated using the CKD-EPI Creatinine Equation (2021)    eGFR  Date Value Ref Range Status  08/02/2022 66 >59 mL/min/1.73 Final         Passed - Patient is not  pregnant      Passed - Valid encounter within last 12 months    Recent Outpatient Visits           6 days ago COVID-19 ruled out by laboratory testing   [facility] Jacky Kindle, FNP   3 weeks ago Sore throat   [facility] [place], [place], [place]   1 month ago Thyroid nodule   [facility] Malva Limes, MD   3 months ago Acute cystitis with hematuria   [facility] [place], [place], PA-C   3 months ago Acute pharyngitis, unspecified etiology   [facility] Alfredia Ferguson, PA-C       Future Appointments             In 3 weeks Deirdre Evener, MD [facility]             fentaNYL (DURAGESIC) 100 MCG/HR 10 patch 0    Sig: Place 1 patch onto the skin every 3 (three) days.     Not Delegated - Analgesics:  Opioid Agonists Failed - 06/01/2023  4:25 PM      Failed - This refill cannot be delegated      Failed - Urine Drug Screen completed in last 360 days       Passed - Valid encounter within last 3 months    Recent Outpatient Visits           6 days ago COVID-19 ruled out by laboratory testing   [facility] Jacky Kindle, FNP   3 weeks ago Sore throat   [facility] [place], [place], PA-C   1 month ago Thyroid nodule   [facility] Malva Limes, MD   3 months ago Acute cystitis with hematuria   [facility] [place], [place], PA-C   3 months ago Acute pharyngitis, unspecified etiology   [facility] Alfredia Ferguson, PA-C       Future Appointments             In 3 weeks Deirdre Evener, MD [facility]

## 2023-06-02 ENCOUNTER — Telehealth: Payer: Self-pay | Admitting: Family Medicine

## 2023-06-02 DIAGNOSIS — Z885 Allergy status to narcotic agent status: Secondary | ICD-10-CM | POA: Diagnosis not present

## 2023-06-02 DIAGNOSIS — R569 Unspecified convulsions: Secondary | ICD-10-CM | POA: Diagnosis not present

## 2023-06-02 DIAGNOSIS — Z880 Allergy status to penicillin: Secondary | ICD-10-CM | POA: Diagnosis not present

## 2023-06-02 DIAGNOSIS — F419 Anxiety disorder, unspecified: Secondary | ICD-10-CM | POA: Diagnosis not present

## 2023-06-02 DIAGNOSIS — M791 Myalgia, unspecified site: Secondary | ICD-10-CM | POA: Diagnosis not present

## 2023-06-02 DIAGNOSIS — Z79899 Other long term (current) drug therapy: Secondary | ICD-10-CM | POA: Diagnosis not present

## 2023-06-02 DIAGNOSIS — Z881 Allergy status to other antibiotic agents status: Secondary | ICD-10-CM | POA: Diagnosis not present

## 2023-06-02 DIAGNOSIS — R519 Headache, unspecified: Secondary | ICD-10-CM | POA: Diagnosis not present

## 2023-06-02 DIAGNOSIS — R110 Nausea: Secondary | ICD-10-CM | POA: Diagnosis not present

## 2023-06-02 DIAGNOSIS — F333 Major depressive disorder, recurrent, severe with psychotic symptoms: Secondary | ICD-10-CM | POA: Diagnosis not present

## 2023-06-02 MED ORDER — CELECOXIB 200 MG PO CAPS: 200.0000 mg | ORAL_CAPSULE | Freq: Two times a day (BID) | ORAL | 5 refills | Status: DC

## 2023-06-02 NOTE — Telephone Encounter (Signed)
Karen [name] with [employer] Is calling to request office visit notes to see if a new power chair was requesting, needing a prescription about a new power chair, Needing script for PT eval. Was a new power discussed at the last ov? Cb- [phone] Fax-[phone]

## 2023-06-02 NOTE — Telephone Encounter (Signed)
Fentanyl was sent to her mail order pharmacy on Monday.... does she want to start getting it from the local pharmacy in the future? or continue getting from mail order. I have  a reminder set to send it to her mail order on the 14th of every month.

## 2023-06-03 ENCOUNTER — Encounter: Payer: Self-pay | Admitting: Obstetrics

## 2023-06-05 DIAGNOSIS — Z885 Allergy status to narcotic agent status: Secondary | ICD-10-CM | POA: Diagnosis not present

## 2023-06-05 DIAGNOSIS — Z881 Allergy status to other antibiotic agents status: Secondary | ICD-10-CM | POA: Diagnosis not present

## 2023-06-05 DIAGNOSIS — F332 Major depressive disorder, recurrent severe without psychotic features: Secondary | ICD-10-CM | POA: Diagnosis not present

## 2023-06-05 DIAGNOSIS — F29 Unspecified psychosis not due to a substance or known physiological condition: Secondary | ICD-10-CM | POA: Diagnosis not present

## 2023-06-05 DIAGNOSIS — F333 Major depressive disorder, recurrent, severe with psychotic symptoms: Secondary | ICD-10-CM | POA: Diagnosis not present

## 2023-06-05 DIAGNOSIS — Z79899 Other long term (current) drug therapy: Secondary | ICD-10-CM | POA: Diagnosis not present

## 2023-06-05 DIAGNOSIS — Z880 Allergy status to penicillin: Secondary | ICD-10-CM | POA: Diagnosis not present

## 2023-06-06 NOTE — Telephone Encounter (Signed)
NA/voicemail set up. If patient calls back ok for [facility] nurse to give provider's message to patient.

## 2023-06-07 NOTE — Telephone Encounter (Signed)
VM is full and I am unable to [facility]. Will try again later.

## 2023-06-09 DIAGNOSIS — Z885 Allergy status to narcotic agent status: Secondary | ICD-10-CM | POA: Diagnosis not present

## 2023-06-09 DIAGNOSIS — Z880 Allergy status to penicillin: Secondary | ICD-10-CM | POA: Diagnosis not present

## 2023-06-09 DIAGNOSIS — Z881 Allergy status to other antibiotic agents status: Secondary | ICD-10-CM | POA: Diagnosis not present

## 2023-06-09 DIAGNOSIS — F333 Major depressive disorder, recurrent, severe with psychotic symptoms: Secondary | ICD-10-CM | POA: Diagnosis not present

## 2023-06-09 NOTE — Telephone Encounter (Signed)
Last time this was discussed with patient was 06/2022 and powered wheel chair and PT evaluation  was ordered at that time, order was resent in May of this year.

## 2023-06-12 DIAGNOSIS — Z881 Allergy status to other antibiotic agents status: Secondary | ICD-10-CM | POA: Diagnosis not present

## 2023-06-12 DIAGNOSIS — R519 Headache, unspecified: Secondary | ICD-10-CM | POA: Diagnosis not present

## 2023-06-12 DIAGNOSIS — J45909 Unspecified asthma, uncomplicated: Secondary | ICD-10-CM | POA: Diagnosis not present

## 2023-06-12 DIAGNOSIS — Z880 Allergy status to penicillin: Secondary | ICD-10-CM | POA: Diagnosis not present

## 2023-06-12 DIAGNOSIS — Z885 Allergy status to narcotic agent status: Secondary | ICD-10-CM | POA: Diagnosis not present

## 2023-06-12 DIAGNOSIS — M797 Fibromyalgia: Secondary | ICD-10-CM | POA: Diagnosis not present

## 2023-06-12 DIAGNOSIS — Z79899 Other long term (current) drug therapy: Secondary | ICD-10-CM | POA: Diagnosis not present

## 2023-06-12 DIAGNOSIS — F333 Major depressive disorder, recurrent, severe with psychotic symptoms: Secondary | ICD-10-CM | POA: Diagnosis not present

## 2023-06-13 NOTE — Telephone Encounter (Signed)
Karen [name] with [employer] reports that Rx's are god for six months and that everything they needed for the process time was not able to be completed. He is hoping with this last one it goes through. Reports that he only need a new script for the powered chair and PT evaluation for the powered chair.

## 2023-06-15 NOTE — Telephone Encounter (Signed)
signed

## 2023-06-16 DIAGNOSIS — J45909 Unspecified asthma, uncomplicated: Secondary | ICD-10-CM | POA: Diagnosis not present

## 2023-06-16 DIAGNOSIS — G40909 Epilepsy, unspecified, not intractable, without status epilepticus: Secondary | ICD-10-CM | POA: Diagnosis not present

## 2023-06-16 DIAGNOSIS — Q796 Ehlers-Danlos syndrome, unspecified: Secondary | ICD-10-CM | POA: Diagnosis not present

## 2023-06-16 DIAGNOSIS — G43009 Migraine without aura, not intractable, without status migrainosus: Secondary | ICD-10-CM | POA: Diagnosis not present

## 2023-06-16 DIAGNOSIS — Z885 Allergy status to narcotic agent status: Secondary | ICD-10-CM | POA: Diagnosis not present

## 2023-06-16 DIAGNOSIS — F333 Major depressive disorder, recurrent, severe with psychotic symptoms: Secondary | ICD-10-CM | POA: Diagnosis not present

## 2023-06-16 DIAGNOSIS — Z881 Allergy status to other antibiotic agents status: Secondary | ICD-10-CM | POA: Diagnosis not present

## 2023-06-16 DIAGNOSIS — G8929 Other chronic pain: Secondary | ICD-10-CM | POA: Diagnosis not present

## 2023-06-16 DIAGNOSIS — Z91018 Allergy to other foods: Secondary | ICD-10-CM | POA: Diagnosis not present

## 2023-06-16 DIAGNOSIS — Z79899 Other long term (current) drug therapy: Secondary | ICD-10-CM | POA: Diagnosis not present

## 2023-06-16 DIAGNOSIS — M797 Fibromyalgia: Secondary | ICD-10-CM | POA: Diagnosis not present

## 2023-06-16 DIAGNOSIS — Z9102 Food additives allergy status: Secondary | ICD-10-CM | POA: Diagnosis not present

## 2023-06-16 DIAGNOSIS — Z880 Allergy status to penicillin: Secondary | ICD-10-CM | POA: Diagnosis not present

## 2023-06-20 DIAGNOSIS — M5136 Other intervertebral disc degeneration, lumbar region with discogenic back pain only: Secondary | ICD-10-CM | POA: Diagnosis not present

## 2023-06-20 DIAGNOSIS — Z4689 Encounter for fitting and adjustment of other specified devices: Secondary | ICD-10-CM | POA: Diagnosis not present

## 2023-06-20 DIAGNOSIS — Z993 Dependence on wheelchair: Secondary | ICD-10-CM | POA: Diagnosis not present

## 2023-06-20 DIAGNOSIS — Q796 Ehlers-Danlos syndrome, unspecified: Secondary | ICD-10-CM | POA: Diagnosis not present

## 2023-06-20 DIAGNOSIS — M069 Rheumatoid arthritis, unspecified: Secondary | ICD-10-CM | POA: Diagnosis not present

## 2023-06-21 DIAGNOSIS — G4733 Obstructive sleep apnea (adult) (pediatric): Secondary | ICD-10-CM | POA: Diagnosis not present

## 2023-06-21 DIAGNOSIS — Z881 Allergy status to other antibiotic agents status: Secondary | ICD-10-CM | POA: Diagnosis not present

## 2023-06-21 DIAGNOSIS — R569 Unspecified convulsions: Secondary | ICD-10-CM | POA: Diagnosis not present

## 2023-06-21 DIAGNOSIS — Z79899 Other long term (current) drug therapy: Secondary | ICD-10-CM | POA: Diagnosis not present

## 2023-06-21 DIAGNOSIS — J45909 Unspecified asthma, uncomplicated: Secondary | ICD-10-CM | POA: Diagnosis not present

## 2023-06-21 DIAGNOSIS — Z880 Allergy status to penicillin: Secondary | ICD-10-CM | POA: Diagnosis not present

## 2023-06-21 DIAGNOSIS — F333 Major depressive disorder, recurrent, severe with psychotic symptoms: Secondary | ICD-10-CM | POA: Diagnosis not present

## 2023-06-21 DIAGNOSIS — Z885 Allergy status to narcotic agent status: Secondary | ICD-10-CM | POA: Diagnosis not present

## 2023-06-21 DIAGNOSIS — Z9989 Dependence on other enabling machines and devices: Secondary | ICD-10-CM | POA: Diagnosis not present

## 2023-06-22 ENCOUNTER — Ambulatory Visit: Payer: Medicare Other | Admitting: Dermatology

## 2023-06-23 DIAGNOSIS — J4530 Mild persistent asthma, uncomplicated: Secondary | ICD-10-CM | POA: Diagnosis not present

## 2023-06-23 DIAGNOSIS — Z881 Allergy status to other antibiotic agents status: Secondary | ICD-10-CM | POA: Diagnosis not present

## 2023-06-23 DIAGNOSIS — G4739 Other sleep apnea: Secondary | ICD-10-CM | POA: Diagnosis not present

## 2023-06-23 DIAGNOSIS — Z9989 Dependence on other enabling machines and devices: Secondary | ICD-10-CM | POA: Diagnosis not present

## 2023-06-23 DIAGNOSIS — G44209 Tension-type headache, unspecified, not intractable: Secondary | ICD-10-CM | POA: Diagnosis not present

## 2023-06-23 DIAGNOSIS — Z880 Allergy status to penicillin: Secondary | ICD-10-CM | POA: Diagnosis not present

## 2023-06-23 DIAGNOSIS — M797 Fibromyalgia: Secondary | ICD-10-CM | POA: Diagnosis not present

## 2023-06-23 DIAGNOSIS — F333 Major depressive disorder, recurrent, severe with psychotic symptoms: Secondary | ICD-10-CM | POA: Diagnosis not present

## 2023-06-23 DIAGNOSIS — Z885 Allergy status to narcotic agent status: Secondary | ICD-10-CM | POA: Diagnosis not present

## 2023-06-23 DIAGNOSIS — Z993 Dependence on wheelchair: Secondary | ICD-10-CM | POA: Diagnosis not present

## 2023-06-23 DIAGNOSIS — Z9109 Other allergy status, other than to drugs and biological substances: Secondary | ICD-10-CM | POA: Diagnosis not present

## 2023-06-23 DIAGNOSIS — Z79899 Other long term (current) drug therapy: Secondary | ICD-10-CM | POA: Diagnosis not present

## 2023-06-23 DIAGNOSIS — Z7951 Long term (current) use of inhaled steroids: Secondary | ICD-10-CM | POA: Diagnosis not present

## 2023-06-23 DIAGNOSIS — G40909 Epilepsy, unspecified, not intractable, without status epilepticus: Secondary | ICD-10-CM | POA: Diagnosis not present

## 2023-06-23 DIAGNOSIS — Z886 Allergy status to analgesic agent status: Secondary | ICD-10-CM | POA: Diagnosis not present

## 2023-06-23 DIAGNOSIS — Z91018 Allergy to other foods: Secondary | ICD-10-CM | POA: Diagnosis not present

## 2023-06-23 DIAGNOSIS — G43909 Migraine, unspecified, not intractable, without status migrainosus: Secondary | ICD-10-CM | POA: Diagnosis not present

## 2023-06-25 ENCOUNTER — Other Ambulatory Visit: Payer: Self-pay | Admitting: Family Medicine

## 2023-06-25 DIAGNOSIS — M5136 Other intervertebral disc degeneration, lumbar region with discogenic back pain only: Secondary | ICD-10-CM

## 2023-06-25 MED ORDER — FENTANYL 100 MCG/HR TD PT72: 1.0000 | MEDICATED_PATCH | TRANSDERMAL | 0 refills | Status: DC

## 2023-06-26 DIAGNOSIS — J45909 Unspecified asthma, uncomplicated: Secondary | ICD-10-CM | POA: Diagnosis not present

## 2023-06-26 DIAGNOSIS — G4733 Obstructive sleep apnea (adult) (pediatric): Secondary | ICD-10-CM | POA: Diagnosis not present

## 2023-06-26 DIAGNOSIS — M797 Fibromyalgia: Secondary | ICD-10-CM | POA: Diagnosis not present

## 2023-06-26 DIAGNOSIS — G8250 Quadriplegia, unspecified: Secondary | ICD-10-CM | POA: Diagnosis not present

## 2023-06-26 DIAGNOSIS — Z79899 Other long term (current) drug therapy: Secondary | ICD-10-CM | POA: Diagnosis not present

## 2023-06-26 DIAGNOSIS — Z9989 Dependence on other enabling machines and devices: Secondary | ICD-10-CM | POA: Diagnosis not present

## 2023-06-26 DIAGNOSIS — F333 Major depressive disorder, recurrent, severe with psychotic symptoms: Secondary | ICD-10-CM | POA: Diagnosis not present

## 2023-06-30 DIAGNOSIS — G4739 Other sleep apnea: Secondary | ICD-10-CM | POA: Diagnosis not present

## 2023-06-30 DIAGNOSIS — J45909 Unspecified asthma, uncomplicated: Secondary | ICD-10-CM | POA: Diagnosis not present

## 2023-06-30 DIAGNOSIS — F333 Major depressive disorder, recurrent, severe with psychotic symptoms: Secondary | ICD-10-CM | POA: Diagnosis not present

## 2023-06-30 DIAGNOSIS — Z79899 Other long term (current) drug therapy: Secondary | ICD-10-CM | POA: Diagnosis not present

## 2023-06-30 DIAGNOSIS — Z881 Allergy status to other antibiotic agents status: Secondary | ICD-10-CM | POA: Diagnosis not present

## 2023-06-30 DIAGNOSIS — Z880 Allergy status to penicillin: Secondary | ICD-10-CM | POA: Diagnosis not present

## 2023-06-30 DIAGNOSIS — Z885 Allergy status to narcotic agent status: Secondary | ICD-10-CM | POA: Diagnosis not present

## 2023-07-07 DIAGNOSIS — Z886 Allergy status to analgesic agent status: Secondary | ICD-10-CM | POA: Diagnosis not present

## 2023-07-07 DIAGNOSIS — Z888 Allergy status to other drugs, medicaments and biological substances status: Secondary | ICD-10-CM | POA: Diagnosis not present

## 2023-07-07 DIAGNOSIS — F333 Major depressive disorder, recurrent, severe with psychotic symptoms: Secondary | ICD-10-CM | POA: Diagnosis not present

## 2023-07-07 DIAGNOSIS — Z885 Allergy status to narcotic agent status: Secondary | ICD-10-CM | POA: Diagnosis not present

## 2023-07-07 DIAGNOSIS — Z880 Allergy status to penicillin: Secondary | ICD-10-CM | POA: Diagnosis not present

## 2023-07-07 DIAGNOSIS — Z881 Allergy status to other antibiotic agents status: Secondary | ICD-10-CM | POA: Diagnosis not present

## 2023-07-08 ENCOUNTER — Other Ambulatory Visit: Payer: Self-pay | Admitting: Family Medicine

## 2023-07-10 ENCOUNTER — Telehealth: Payer: Self-pay | Admitting: Family Medicine

## 2023-07-10 NOTE — Telephone Encounter (Signed)
Karen [name] calling from [facility] is calling to report that the pts last face to face was last year. Can an addendum be done to the last ov from August 2024 talking about the power chair? Cb- [phone]

## 2023-07-17 DIAGNOSIS — Z881 Allergy status to other antibiotic agents status: Secondary | ICD-10-CM | POA: Diagnosis not present

## 2023-07-17 DIAGNOSIS — F333 Major depressive disorder, recurrent, severe with psychotic symptoms: Secondary | ICD-10-CM | POA: Diagnosis not present

## 2023-07-17 DIAGNOSIS — Z880 Allergy status to penicillin: Secondary | ICD-10-CM | POA: Diagnosis not present

## 2023-07-17 DIAGNOSIS — Z79899 Other long term (current) drug therapy: Secondary | ICD-10-CM | POA: Diagnosis not present

## 2023-07-17 DIAGNOSIS — Z885 Allergy status to narcotic agent status: Secondary | ICD-10-CM | POA: Diagnosis not present

## 2023-07-18 ENCOUNTER — Telehealth: Payer: Self-pay

## 2023-07-18 NOTE — Telephone Encounter (Signed)
Copied from CRM [id number]. Topic: General - Inquiry >> Jul 18, 2023  8:35 AM De Blanch wrote: Reason for CRM: Myrene Buddy from healthcare equipment is requesting to speak with Dr. Theodis Aguas nurse. Stated wants to ask if it's possible to amend one of the visits for 08/30 for pt's wheelchair.  Please advise.

## 2023-07-19 ENCOUNTER — Other Ambulatory Visit: Payer: Self-pay | Admitting: Family Medicine

## 2023-07-19 DIAGNOSIS — G2569 Other tics of organic origin: Secondary | ICD-10-CM

## 2023-07-19 DIAGNOSIS — F411 Generalized anxiety disorder: Secondary | ICD-10-CM | POA: Diagnosis not present

## 2023-07-19 DIAGNOSIS — F331 Major depressive disorder, recurrent, moderate: Secondary | ICD-10-CM | POA: Diagnosis not present

## 2023-07-19 NOTE — Telephone Encounter (Signed)
Myrene Buddy from [employer] called to say she needs the same notes that were used from the 07/05/22 OV for 04/14/23 OV. Please f/u with Myrene Buddy for more info

## 2023-07-19 NOTE — Telephone Encounter (Signed)
OV note faxed

## 2023-07-19 NOTE — Telephone Encounter (Signed)
Medication Refill -  Most Recent Primary Care Visit:  Provider: Merita Norton T  Department: [facility]  Visit Type: OFFICE VISIT  Date: 05/26/2023  Medication: haloperidol (HALDOL) 0.5 MG tablet   Has the patient contacted their pharmacy? Yes (Agent: If no, request that the patient contact the pharmacy for the refill. If patient does not wish to contact the pharmacy document the reason why and proceed with request.) (Agent: If yes, when and what did the pharmacy advise?)  Is this the correct pharmacy for this prescription? Yes If no, delete pharmacy and type the correct one.  This is the patient's preferred pharmacy: [id number] [place], [place] - [address] [place] [address] Phone: [phone] Fax: [phone]  Has the prescription been filled recently? No  Is the patient out of the medication? Yes  Has the patient been seen for an appointment in the last year OR does the patient have an upcoming appointment? Yes  Can we respond through MyChart? No  Agent: Please be advised that Rx refills may take up to 3 business days. We ask that you follow-up with your pharmacy.

## 2023-07-19 NOTE — Telephone Encounter (Signed)
Spoke to Neville to verify fax number and she reports to have OV note sent to  [phone] Attention to [facility]

## 2023-07-19 NOTE — Telephone Encounter (Signed)
Have made addendum to 04-14-23 note

## 2023-07-20 NOTE — Telephone Encounter (Signed)
Requested medication (s) are due for refill today: yes  Requested medication (s) are on the active medication list: yes  Last refill:  09/23/22 #30/5  Future visit scheduled: no  Notes to clinic:  Unable to refill per protocol, cannot delegate. By another provider as well       Requested Prescriptions  Pending Prescriptions Disp Refills   haloperidol (HALDOL) 0.5 MG tablet 30 tablet 5    Sig: TAKE 1/2 OF A TABLET (0.25 MG TOTAL) BY MOUTH TWICE A DAY     Not Delegated - Psychiatry: Antipsychotics - First Generation (Typical) - haloperidol Failed - 07/19/2023  2:20 PM      Failed - This refill cannot be delegated      Failed - Lipid Panel in normal range within the last 12 months    Cholesterol, Total  Date Value Ref Range Status  11/17/2022 294 (H) 100 - 199 mg/dL Final   LDL Chol Calc (NIH)  Date Value Ref Range Status  11/17/2022 188 (H) 0 - 99 mg/dL Final   HDL  Date Value Ref Range Status  11/17/2022 80 >39 mg/dL Final   Triglycerides  Date Value Ref Range Status  11/17/2022 144 0 - 149 mg/dL Final         Passed - Completed PHQ-2 or PHQ-9 in the last 360 days      Passed - Last BP in normal range    BP Readings from Last 1 Encounters:  05/26/23 117/79         Passed - Last Heart Rate in normal range    Pulse Readings from Last 1 Encounters:  05/26/23 99         Passed - Valid encounter within last 6 months    Recent Outpatient Visits           1 month ago COVID-19 ruled out by laboratory testing   [facility] Jacky Kindle, FNP   2 months ago Sore throat   [facility] [place], Joyce, PA-C   3 months ago Thyroid nodule   [facility] Malva Limes, MD   4 months ago Acute cystitis with hematuria   [facility] Mamers, [place], PA-C   5 months ago Acute pharyngitis, unspecified etiology   [facility] [place] Edwards, [place],  PA-C              Passed - CBC within normal limits and completed in the last 12 months    WBC  Date Value Ref Range Status  04/11/2023 6.8 4.0 - 10.5 K/uL Final   RBC  Date Value Ref Range Status  04/11/2023 4.46 3.87 - 5.11 MIL/uL Final   Hemoglobin  Date Value Ref Range Status  04/11/2023 13.0 12.0 - 15.0 g/dL Final  40/05/2724 36.6 11.1 - 15.9 g/dL Final   HCT  Date Value Ref Range Status  04/11/2023 39.1 36.0 - 46.0 % Final   Hematocrit  Date Value Ref Range Status  08/02/2022 36.4 34.0 - 46.6 % Final   MCHC  Date Value Ref Range Status  04/11/2023 33.2 30.0 - 36.0 g/dL Final   [facility]  Date Value Ref Range Status  04/11/2023 29.1 26.0 - 34.0 pg Final   MCV  Date Value Ref Range Status  04/11/2023 87.7 80.0 - 100.0 fL Final  08/02/2022 85 79 - 97 fL Final  02/19/2014 92 80 - 100 fL Final   No results found  for: "PLTCOUNTKUC", "LABPLAT", "POCPLA" RDW  Date Value Ref Range Status  04/11/2023 13.1 11.5 - 15.5 % Final  08/02/2022 13.0 11.7 - 15.4 % Final  02/19/2014 13.8 11.5 - 14.5 % Final         Passed - CMP within normal limits and completed in the last 12 months    Albumin  Date Value Ref Range Status  08/02/2022 4.1 3.9 - 4.9 g/dL Final  21/30/8657 3.6 3.4 - 5.0 g/dL Final   Alkaline Phosphatase  Date Value Ref Range Status  08/02/2022 94 44 - 121 IU/L Final  02/19/2014 73 Unit/L Final    Comment:    45-117 NOTE: New Reference Range 07/05/13    ALT  Date Value Ref Range Status  08/02/2022 10 0 - 32 IU/L Final   SGPT (ALT)  Date Value Ref Range Status  02/19/2014 27 12 - 78 U/L Final   AST  Date Value Ref Range Status  08/02/2022 15 0 - 40 IU/L Final   SGOT(AST)  Date Value Ref Range Status  02/19/2014 20 15 - 37 Unit/L Final   BUN  Date Value Ref Range Status  08/02/2022 16 6 - 24 mg/dL Final  84/69/6295 9 7 - 18 mg/dL Final   Calcium  Date Value Ref Range Status  08/02/2022 9.0 8.7 - 10.2 mg/dL Final   Calcium, Total   Date Value Ref Range Status  02/19/2014 9.0 8.5 - 10.1 mg/dL Final   CO2  Date Value Ref Range Status  08/02/2022 27 20 - 29 mmol/L Final   Co2  Date Value Ref Range Status  02/19/2014 29 21 - 32 mmol/L Final   Bicarbonate  Date Value Ref Range Status  10/28/2017 29.1 (H) 20.0 - 28.0 mmol/L Final   Creatinine  Date Value Ref Range Status  02/19/2014 0.82 0.60 - 1.30 mg/dL Final   Creatinine, Ser  Date Value Ref Range Status  08/02/2022 1.06 (H) 0.57 - 1.00 mg/dL Final   Glucose  Date Value Ref Range Status  08/02/2022 92 70 - 99 mg/dL Final  28/41/3244 010 mg/dL Final  27/25/3664 86 65 - 99 mg/dL Final   Glucose, Bld  Date Value Ref Range Status  07/10/2022 104 (H) 70 - 99 mg/dL Final    Comment:    Glucose reference range applies only to samples taken after fasting for at least 8 hours.   Glucose-Capillary  Date Value Ref Range Status  06/27/2022 121 (H) 70 - 99 mg/dL Final    Comment:    Glucose reference range applies only to samples taken after fasting for at least 8 hours.   Potassium  Date Value Ref Range Status  08/02/2022 4.6 3.5 - 5.2 mmol/L Final  02/19/2014 3.9 3.5 - 5.1 mmol/L Final   Sodium  Date Value Ref Range Status  08/02/2022 143 134 - 144 mmol/L Final  02/19/2014 140 136 - 145 mmol/L Final   Bilirubin,Total  Date Value Ref Range Status  02/19/2014 0.2 0.2 - 1.0 mg/dL Final   Bilirubin Total  Date Value Ref Range Status  08/02/2022 <0.2 0.0 - 1.2 mg/dL Final   Bilirubin, Direct  Date Value Ref Range Status  10/04/2021 0.1 0.0 - 0.2 mg/dL Final   Indirect Bilirubin  Date Value Ref Range Status  10/04/2021 0.5 0.3 - 0.9 mg/dL Final    Comment:    Performed at [facility], [address], [place], [place] [address]   Protein, ur  Date Value Ref Range Status  10/19/2022  NEGATIVE NEGATIVE mg/dL Final   Protein,UA  Date Value Ref Range Status  04/11/2023 Negative Negative/Trace Final   Total Protein  Date Value Ref  Range Status  08/02/2022 6.4 6.0 - 8.5 g/dL Final  40/98/1191 6.7 6.4 - 8.2 g/dL Final   EGFR (African American)  Date Value Ref Range Status  02/19/2014 >60  Final   GFR calc Af Amer  Date Value Ref Range Status  10/15/2019 80 >59 mL/min/1.73 Final   eGFR  Date Value Ref Range Status  08/02/2022 66 >59 mL/min/1.73 Final   EGFR (Non-African Amer.)  Date Value Ref Range Status  02/19/2014 >60  Final    Comment:    eGFR values <42mL/min/1.73 m2 may be an indication of chronic kidney disease (CKD). Calculated eGFR is useful in patients with stable renal function. The eGFR calculation will not be reliable in acutely ill patients when serum creatinine is changing rapidly. It is not useful in  patients on dialysis. The eGFR calculation may not be applicable to patients at the low and high extremes of body sizes, pregnant women, and vegetarians.    GFR, Estimated  Date Value Ref Range Status  07/10/2022 >60 >60 mL/min Final    Comment:    (NOTE) Calculated using the CKD-EPI Creatinine Equation (2021)

## 2023-07-24 DIAGNOSIS — Z881 Allergy status to other antibiotic agents status: Secondary | ICD-10-CM | POA: Diagnosis not present

## 2023-07-24 DIAGNOSIS — F333 Major depressive disorder, recurrent, severe with psychotic symptoms: Secondary | ICD-10-CM | POA: Diagnosis not present

## 2023-07-24 DIAGNOSIS — F332 Major depressive disorder, recurrent severe without psychotic features: Secondary | ICD-10-CM | POA: Diagnosis not present

## 2023-07-24 DIAGNOSIS — Z880 Allergy status to penicillin: Secondary | ICD-10-CM | POA: Diagnosis not present

## 2023-07-24 DIAGNOSIS — Z885 Allergy status to narcotic agent status: Secondary | ICD-10-CM | POA: Diagnosis not present

## 2023-07-25 ENCOUNTER — Other Ambulatory Visit: Payer: Self-pay | Admitting: Family Medicine

## 2023-07-25 DIAGNOSIS — M5136 Other intervertebral disc degeneration, lumbar region with discogenic back pain only: Secondary | ICD-10-CM

## 2023-07-25 MED ORDER — FENTANYL 100 MCG/HR TD PT72: 1.0000 | MEDICATED_PATCH | TRANSDERMAL | 0 refills | Status: DC

## 2023-08-07 DIAGNOSIS — R29898 Other symptoms and signs involving the musculoskeletal system: Secondary | ICD-10-CM | POA: Diagnosis not present

## 2023-08-07 DIAGNOSIS — J45909 Unspecified asthma, uncomplicated: Secondary | ICD-10-CM | POA: Diagnosis not present

## 2023-08-07 DIAGNOSIS — Z886 Allergy status to analgesic agent status: Secondary | ICD-10-CM | POA: Diagnosis not present

## 2023-08-07 DIAGNOSIS — Z91048 Other nonmedicinal substance allergy status: Secondary | ICD-10-CM | POA: Diagnosis not present

## 2023-08-07 DIAGNOSIS — Z87891 Personal history of nicotine dependence: Secondary | ICD-10-CM | POA: Diagnosis not present

## 2023-08-07 DIAGNOSIS — R531 Weakness: Secondary | ICD-10-CM | POA: Diagnosis not present

## 2023-08-07 DIAGNOSIS — M1990 Unspecified osteoarthritis, unspecified site: Secondary | ICD-10-CM | POA: Diagnosis not present

## 2023-08-07 DIAGNOSIS — Z91018 Allergy to other foods: Secondary | ICD-10-CM | POA: Diagnosis not present

## 2023-08-07 DIAGNOSIS — R4781 Slurred speech: Secondary | ICD-10-CM | POA: Diagnosis not present

## 2023-08-07 DIAGNOSIS — M797 Fibromyalgia: Secondary | ICD-10-CM | POA: Diagnosis not present

## 2023-08-07 DIAGNOSIS — Q796 Ehlers-Danlos syndrome, unspecified: Secondary | ICD-10-CM | POA: Diagnosis not present

## 2023-08-07 DIAGNOSIS — Z79899 Other long term (current) drug therapy: Secondary | ICD-10-CM | POA: Diagnosis not present

## 2023-08-07 DIAGNOSIS — Z881 Allergy status to other antibiotic agents status: Secondary | ICD-10-CM | POA: Diagnosis not present

## 2023-08-07 DIAGNOSIS — Z885 Allergy status to narcotic agent status: Secondary | ICD-10-CM | POA: Diagnosis not present

## 2023-08-07 DIAGNOSIS — Z880 Allergy status to penicillin: Secondary | ICD-10-CM | POA: Diagnosis not present

## 2023-08-08 DIAGNOSIS — R531 Weakness: Secondary | ICD-10-CM | POA: Diagnosis not present

## 2023-08-13 ENCOUNTER — Other Ambulatory Visit: Payer: Self-pay | Admitting: Physician Assistant

## 2023-08-13 DIAGNOSIS — R12 Heartburn: Secondary | ICD-10-CM

## 2023-08-21 DIAGNOSIS — F332 Major depressive disorder, recurrent severe without psychotic features: Secondary | ICD-10-CM | POA: Diagnosis not present

## 2023-08-21 DIAGNOSIS — Z881 Allergy status to other antibiotic agents status: Secondary | ICD-10-CM | POA: Diagnosis not present

## 2023-08-21 DIAGNOSIS — Z79899 Other long term (current) drug therapy: Secondary | ICD-10-CM | POA: Diagnosis not present

## 2023-08-21 DIAGNOSIS — Z885 Allergy status to narcotic agent status: Secondary | ICD-10-CM | POA: Diagnosis not present

## 2023-08-21 DIAGNOSIS — F333 Major depressive disorder, recurrent, severe with psychotic symptoms: Secondary | ICD-10-CM | POA: Diagnosis not present

## 2023-08-21 DIAGNOSIS — Z880 Allergy status to penicillin: Secondary | ICD-10-CM | POA: Diagnosis not present

## 2023-08-23 ENCOUNTER — Telehealth: Payer: Self-pay

## 2023-08-23 NOTE — Telephone Encounter (Signed)
 Copied from CRM [id number]. Topic: General - Other >> Aug 23, 2023  4:13 PM Turkey B wrote: Reason for CRM: pt called in wants to know why she was only given, 25 of the QUEtiapine (SEROQUEL) 100 MG tablet, instead of 100. Please cb

## 2023-08-24 ENCOUNTER — Other Ambulatory Visit: Payer: Self-pay | Admitting: Family Medicine

## 2023-08-24 DIAGNOSIS — M5136 Other intervertebral disc degeneration, lumbar region with discogenic back pain only: Secondary | ICD-10-CM

## 2023-08-24 NOTE — Telephone Encounter (Signed)
 The last prescription sent from our office was on 04/14/23 for a 90 day supply with ONE refill for Quetiapine  100 mg, not Quetiapine  25 mg. She should contact the pharmacy to ask why they dispensed the wrong strength. She verbalized understanding and will contact pharmacy

## 2023-08-24 NOTE — Telephone Encounter (Signed)
 Medication Refill -  Most Recent Primary Care Visit:  Provider: PAYNE, ELISE T  Department: [facility]  Visit Type: OFFICE VISIT  Date: 05/26/2023  Medication: fentaNYL  (DURAGESIC ) 100 MCG/HR   QUEtiapine  (SEROQUEL ) 100 MG tablet   Has the patient contacted their pharmacy? Yes   Is this the correct pharmacy for this prescription? Yes  This is the patient's preferred pharmacy:  [id number] [place], [place] - [address] [place] [address] Phone: [phone] Fax: [phone]   Has the prescription been filled recently? Yes  Is the patient out of the medication? Yes  Has the patient been seen for an appointment in the last year OR does the patient have an upcoming appointment? Yes  Can we respond through MyChart? No  Agent: Please be advised that Rx refills may take up to 3 business days. We ask that you follow-up with your pharmacy.

## 2023-08-25 ENCOUNTER — Other Ambulatory Visit: Payer: Self-pay | Admitting: Family Medicine

## 2023-08-25 DIAGNOSIS — M5136 Other intervertebral disc degeneration, lumbar region with discogenic back pain only: Secondary | ICD-10-CM

## 2023-08-25 MED ORDER — FENTANYL 100 MCG/HR TD PT72
1.0000 | MEDICATED_PATCH | TRANSDERMAL | 0 refills | Status: DC
Start: 2023-08-25 — End: 2023-08-30

## 2023-08-28 DIAGNOSIS — Z880 Allergy status to penicillin: Secondary | ICD-10-CM | POA: Diagnosis not present

## 2023-08-28 DIAGNOSIS — Z885 Allergy status to narcotic agent status: Secondary | ICD-10-CM | POA: Diagnosis not present

## 2023-08-28 DIAGNOSIS — F333 Major depressive disorder, recurrent, severe with psychotic symptoms: Secondary | ICD-10-CM | POA: Diagnosis not present

## 2023-08-28 DIAGNOSIS — Z881 Allergy status to other antibiotic agents status: Secondary | ICD-10-CM | POA: Diagnosis not present

## 2023-08-28 NOTE — Telephone Encounter (Signed)
 Requested medication (s) are due for refill today: yes, sending to local pharmacy  Requested medication (s) are on the active medication list: yes  Last refill:  04/14/23 #90/1  Future visit scheduled: no  Notes to clinic:  Unable to refill per protocol, cannot delegate. Pt asking for Seroquel  and [facility]      Requested Prescriptions  Pending Prescriptions Disp Refills   QUEtiapine  (SEROQUEL ) 100 MG tablet 90 tablet 1    Sig: Take 1 tablet (100 mg total) by mouth at bedtime.     Not Delegated - Psychiatry:  Antipsychotics - Second Generation (Atypical) - quetiapine  Failed - 08/28/2023 12:23 PM      Failed - This refill cannot be delegated      Failed - Lipid Panel in normal range within the last 12 months    Cholesterol, Total  Date Value Ref Range Status  11/17/2022 294 (H) 100 - 199 mg/dL Final   LDL Chol Calc (NIH)  Date Value Ref Range Status  11/17/2022 188 (H) 0 - 99 mg/dL Final   HDL  Date Value Ref Range Status  11/17/2022 80 >39 mg/dL Final   Triglycerides  Date Value Ref Range Status  11/17/2022 144 0 - 149 mg/dL Final         Failed - CBC within normal limits and completed in the last 12 months    WBC  Date Value Ref Range Status  04/11/2023 6.8 4.0 - 10.5 K/uL Final   RBC  Date Value Ref Range Status  04/11/2023 4.46 3.87 - 5.11 MIL/uL Final   Hemoglobin  Date Value Ref Range Status  04/11/2023 13.0 12.0 - 15.0 g/dL Final  87/80/7976 87.9 11.1 - 15.9 g/dL Final   HCT  Date Value Ref Range Status  04/11/2023 39.1 36.0 - 46.0 % Final   Hematocrit  Date Value Ref Range Status  08/02/2022 36.4 34.0 - 46.6 % Final   MCHC  Date Value Ref Range Status  04/11/2023 33.2 30.0 - 36.0 g/dL Final   [facility]  Date Value Ref Range Status  04/11/2023 29.1 26.0 - 34.0 pg Final   MCV  Date Value Ref Range Status  04/11/2023 87.7 80.0 - 100.0 fL Final  08/02/2022 85 79 - 97 fL Final  02/19/2014 92 80 - 100 fL Final   No results  found for: PLTCOUNTKUC, LABPLAT, POCPLA RDW  Date Value Ref Range Status  04/11/2023 13.1 11.5 - 15.5 % Final  08/02/2022 13.0 11.7 - 15.4 % Final  02/19/2014 13.8 11.5 - 14.5 % Final         Failed - CMP within normal limits and completed in the last 12 months    Albumin  Date Value Ref Range Status  08/02/2022 4.1 3.9 - 4.9 g/dL Final  92/91/7984 3.6 3.4 - 5.0 g/dL Final   Alkaline Phosphatase  Date Value Ref Range Status  08/02/2022 94 44 - 121 IU/L Final  02/19/2014 73 Unit/L Final    Comment:    45-117 NOTE: New Reference Range 07/05/13    ALT  Date Value Ref Range Status  08/02/2022 10 0 - 32 IU/L Final   SGPT (ALT)  Date Value Ref Range Status  02/19/2014 27 12 - 78 U/L Final   AST  Date Value Ref Range Status  08/02/2022 15 0 - 40 IU/L Final   SGOT(AST)  Date Value Ref Range Status  02/19/2014 20 15 - 37 Unit/L Final   BUN  Date Value  Ref Range Status  08/02/2022 16 6 - 24 mg/dL Final  92/91/7984 9 7 - 18 mg/dL Final   Calcium  Date Value Ref Range Status  08/02/2022 9.0 8.7 - 10.2 mg/dL Final   Calcium, Total  Date Value Ref Range Status  02/19/2014 9.0 8.5 - 10.1 mg/dL Final   CO2  Date Value Ref Range Status  08/02/2022 27 20 - 29 mmol/L Final   Co2  Date Value Ref Range Status  02/19/2014 29 21 - 32 mmol/L Final   Bicarbonate  Date Value Ref Range Status  10/28/2017 29.1 (H) 20.0 - 28.0 mmol/L Final   Creatinine  Date Value Ref Range Status  02/19/2014 0.82 0.60 - 1.30 mg/dL Final   Creatinine, Ser  Date Value Ref Range Status  08/02/2022 1.06 (H) 0.57 - 1.00 mg/dL Final   Glucose  Date Value Ref Range Status  08/02/2022 92 70 - 99 mg/dL Final  91/78/7984 897 mg/dL Final  92/91/7984 86 65 - 99 mg/dL Final   Glucose, Bld  Date Value Ref Range Status  07/10/2022 104 (H) 70 - 99 mg/dL Final    Comment:    Glucose reference range applies only to samples taken after fasting for at least 8 hours.   Glucose-Capillary   Date Value Ref Range Status  06/27/2022 121 (H) 70 - 99 mg/dL Final    Comment:    Glucose reference range applies only to samples taken after fasting for at least 8 hours.   Potassium  Date Value Ref Range Status  08/02/2022 4.6 3.5 - 5.2 mmol/L Final  02/19/2014 3.9 3.5 - 5.1 mmol/L Final   Sodium  Date Value Ref Range Status  08/02/2022 143 134 - 144 mmol/L Final  02/19/2014 140 136 - 145 mmol/L Final   Bilirubin,Total  Date Value Ref Range Status  02/19/2014 0.2 0.2 - 1.0 mg/dL Final   Bilirubin Total  Date Value Ref Range Status  08/02/2022 <0.2 0.0 - 1.2 mg/dL Final   Bilirubin, Direct  Date Value Ref Range Status  10/04/2021 0.1 0.0 - 0.2 mg/dL Final   Indirect Bilirubin  Date Value Ref Range Status  10/04/2021 0.5 0.3 - 0.9 mg/dL Final    Comment:    Performed at [facility], [address] [place], [place] [address]   Protein, ur  Date Value Ref Range Status  10/19/2022 NEGATIVE NEGATIVE mg/dL Final   Protein,UA  Date Value Ref Range Status  04/11/2023 Negative Negative/Trace Final   Total Protein  Date Value Ref Range Status  08/02/2022 6.4 6.0 - 8.5 g/dL Final  92/91/7984 6.7 6.4 - 8.2 g/dL Final   EGFR (African American)  Date Value Ref Range Status  02/19/2014 >60  Final   GFR calc Af Amer  Date Value Ref Range Status  10/15/2019 80 >59 mL/min/1.73 Final   eGFR  Date Value Ref Range Status  08/02/2022 66 >59 mL/min/1.73 Final   EGFR (Non-African Amer.)  Date Value Ref Range Status  02/19/2014 >60  Final    Comment:    eGFR values <44mL/min/1.73 m2 may be an indication of chronic kidney disease (CKD). Calculated eGFR is useful in patients with stable renal function. The eGFR calculation will not be reliable in acutely ill patients when serum creatinine is changing rapidly. It is not useful in  patients on dialysis. The eGFR calculation may not be applicable to patients at the low and high extremes of body sizes,  pregnant women, and vegetarians.    GFR, Estimated  Date Value Ref Range Status  07/10/2022 >60 >60 mL/min Final    Comment:    (NOTE) Calculated using the CKD-EPI Creatinine Equation (2021)          Passed - TSH in normal range and within 360 days    TSH  Date Value Ref Range Status  05/08/2023 0.865 0.450 - 4.500 uIU/mL Final         Passed - Completed PHQ-2 or PHQ-9 in the last 360 days      Passed - Last BP in normal range    BP Readings from Last 1 Encounters:  05/26/23 117/79         Passed - Last Heart Rate in normal range    Pulse Readings from Last 1 Encounters:  05/26/23 99         Passed - Valid encounter within last 6 months    Recent Outpatient Visits           3 months ago COVID-19 ruled out by laboratory testing   [facility] Emilio Kelly DASEN, FNP   3 months ago Sore throat   [facility] [place], [place], [place]   4 months ago Thyroid  nodule   [facility] Gasper Nancyann BRAVO, MD   5 months ago Acute cystitis with hematuria   [facility] [place], [place], PA-C   6 months ago Acute pharyngitis, unspecified etiology   [facility] Cyndi Shaver, PA-C

## 2023-08-30 MED ORDER — QUETIAPINE FUMARATE 100 MG PO TABS: 100.0000 mg | ORAL_TABLET | Freq: Every day | ORAL | 3 refills | Status: DC

## 2023-08-30 MED ORDER — FENTANYL 100 MCG/HR TD PT72: 1.0000 | MEDICATED_PATCH | TRANSDERMAL | 0 refills | Status: DC

## 2023-08-30 NOTE — Addendum Note (Signed)
 Addended by: GASPER NANCYANN BRAVO on: 08/30/2023 04:13 PM   Modules accepted: Orders

## 2023-09-06 ENCOUNTER — Emergency Department
Admission: EM | Admit: 2023-09-06 | Discharge: 2023-09-06 | Disposition: A | Discharge status: Home / Self Care | Payer: Medicare Other | Attending: Emergency Medicine | Admitting: Emergency Medicine

## 2023-09-06 ENCOUNTER — Encounter: Payer: Self-pay | Admitting: *Deleted

## 2023-09-06 ENCOUNTER — Other Ambulatory Visit: Payer: Self-pay

## 2023-09-06 ENCOUNTER — Emergency Department: Payer: Medicare Other

## 2023-09-06 DIAGNOSIS — M542 Cervicalgia: Secondary | ICD-10-CM | POA: Diagnosis not present

## 2023-09-06 DIAGNOSIS — M25512 Pain in left shoulder: Secondary | ICD-10-CM | POA: Insufficient documentation

## 2023-09-06 DIAGNOSIS — S199XXA Unspecified injury of neck, initial encounter: Secondary | ICD-10-CM | POA: Diagnosis not present

## 2023-09-06 MED ORDER — METHOCARBAMOL 500 MG PO TABS: 500.0000 mg | ORAL_TABLET | Freq: Three times a day (TID) | ORAL | 0 refills | Status: AC | PRN

## 2023-09-06 NOTE — Discharge Instructions (Signed)
Please pick up the medication as prescribed and take as needed.  You can follow-up with your primary care provider or orthopedic for ongoing reassessment

## 2023-09-06 NOTE — ED Provider Notes (Signed)
[facility] Provider Note    Event Date/Time   First MD Initiated Contact with Patient 09/06/23 2056     (approximate)   History   Shoulder Pain   HPI Karen [name] is a 48 y.o. female with history of Ehlers-Danlos, fibromyalgia presenting today for left shoulder pain.  Patient states onset of pain 2 days ago.  Unsure of any direct injury but states she sometimes gets injuries while in her sleep.  Notices pain at the left shoulder and with any movement.  Some radiation from her neck.  No obvious numbness to the hand.  Denies trauma elsewhere.  No recent neck trauma.     Physical Exam   Triage Vital Signs: ED Triage Vitals  Encounter Vitals Group     BP 09/06/23 1930 125/78     Systolic BP Percentile --      Diastolic BP Percentile --      Pulse Rate 09/06/23 1930 97     Resp 09/06/23 1930 18     Temp 09/06/23 1930 98.9 F (37.2 C)     Temp Source 09/06/23 1930 Oral     SpO2 09/06/23 1930 97 %     Weight 09/06/23 1928 125 lb (56.7 kg)     Height 09/06/23 1928 4\' 11"  (1.499 m)     Head Circumference --      Peak Flow --      Pain Score 09/06/23 1928 7     Pain Loc --      Pain Education --      Exclude from Growth Chart --     Most recent vital signs: Vitals:   09/06/23 1930  BP: 125/78  Pulse: 97  Resp: 18  Temp: 98.9 F (37.2 C)  SpO2: 97%   I have reviewed the vital signs. General:  Awake, alert, no acute distress. Head:  Normocephalic, Atraumatic. EENT:  PERRL, EOMI, Oral mucosa pink and moist, Neck is supple. Cardiovascular: Regular rate, 2+ distal pulses. Respiratory:  Normal respiratory effort, symmetrical expansion, no distress.   Extremities: Mild tenderness palpation over left shoulder without obvious deformity.  Some pain with range of motion.  Mild pain at the base of the left side of the neck.  Sensation intact.  Strength intact throughout left upper extremity Neuro:  Alert and oriented.  Interacting appropriately.    Skin:  Warm, dry, no rash.   Psych: Appropriate affect.    ED Results / Procedures / Treatments   Labs (all labs ordered are listed, but only abnormal results are displayed) Labs Reviewed - No data to display   EKG    RADIOLOGY Independently interpreted left shoulder x-ray and CT C-spine with no acute pathology   PROCEDURES:  Critical Care performed: No  Procedures   MEDICATIONS ORDERED IN ED: Medications - No data to display   IMPRESSION / MDM / ASSESSMENT AND PLAN / ED COURSE  I reviewed the triage vital signs and the nursing notes.                              Differential diagnosis includes, but is not limited to, humerus fracture, AC joint separation, cervical spine injury, nerve impingement, fibromyalgia exacerbation  Patient's presentation is most consistent with acute complicated illness / injury requiring diagnostic workup.  Patient is a 48 year old female presenting today for left shoulder pain without obvious trauma.  Some pain with range of motion on exam.  Vital signs stable.  No acute neurological changes.  X-ray left shoulder unremarkable.  Follow-up CT of C-spine to rule out any central pathology also unremarkable.  Patient otherwise stable and will start on muscle relaxer and have her follow-up with her PCP/Ortho.     FINAL CLINICAL IMPRESSION(S) / ED DIAGNOSES   Final diagnoses:  Acute pain of left shoulder     Rx / DC Orders   ED Discharge Orders          Ordered    methocarbamol (ROBAXIN) 500 MG tablet  Every 8 hours PRN        09/06/23 2156             Note:  This document was prepared using Dragon voice recognition software and may include unintentional dictation errors.   Janith Lima, MD 09/06/23 2200

## 2023-09-06 NOTE — ED Triage Notes (Addendum)
Pt report left shoulder pain for 2 days.  No known injury. Pt alert  speech clear.  Good rom of left shoulder/arm  pt reports she a fentanyl patch.  Pt reports she may have injured her shoulder while sleeping.

## 2023-09-11 ENCOUNTER — Ambulatory Visit: Payer: Self-pay | Admitting: *Deleted

## 2023-09-11 NOTE — Telephone Encounter (Signed)
Reason for Disposition  [1] MODERATE longstanding difficulty breathing (e.g., speaks in phrases, SOB even at rest, pulse 100-120) AND [2] SAME as normal  Answer Assessment - Initial Assessment Questions 1. RESPIRATORY STATUS: "Describe your breathing?" (e.g., wheezing, shortness of breath, unable to speak, severe coughing)      I have wheezing.  I have asthma.   I need a new breathing machine.    Mine it broken.    2. ONSET: "When did this breathing problem begin?"      It started last week. 3. PATTERN "Does the difficult breathing come and go, or has it been constant since it started?"      I have a productive cough.   It's clear and yellow 4. SEVERITY: "How bad is your breathing?" (e.g., mild, moderate, severe)    - MILD: No SOB at rest, mild SOB with walking, speaks normally in sentences, can lie down, no retractions, pulse < 100.    - MODERATE: SOB at rest, SOB with minimal exertion and prefers to sit, cannot lie down flat, speaks in phrases, mild retractions, audible wheezing, pulse 100-120.    - SEVERE: Very SOB at rest, speaks in single words, struggling to breathe, sitting hunched forward, retractions, pulse > 120      Mild shortness of breath 5. RECURRENT SYMPTOM: "Have you had difficulty breathing before?" If Yes, ask: "When was the last time?" and "What happened that time?"      Yes  I have asthma 6. CARDIAC HISTORY: "Do you have any history of heart disease?" (e.g., heart attack, angina, bypass surgery, angioplasty)      Not asked 7. LUNG HISTORY: "Do you have any history of lung disease?"  (e.g., pulmonary embolus, asthma, emphysema)     I have asthma. 8. CAUSE: "What do you think is causing the breathing problem?"      I have asthma 9. OTHER SYMPTOMS: "Do you have any other symptoms? (e.g., dizziness, runny nose, cough, chest pain, fever)     Runny nose, post nasal drip 10. O2 SATURATION MONITOR:  "Do you use an oxygen saturation monitor (pulse oximeter) at home?" If Yes, ask:  "What is your reading (oxygen level) today?" "What is your usual oxygen saturation reading?" (e.g., 95%)       Not asked 11. PREGNANCY: "Is there any chance you are pregnant?" "When was your last menstrual period?"       Not asked 12. TRAVEL: "Have you traveled out of the country in the last month?" (e.g., travel history, exposures)       Not asked  Protocols used: Breathing Difficulty-A-AH

## 2023-09-11 NOTE — Telephone Encounter (Signed)
  Chief Complaint: Asthma flare up Symptoms: wheezing and productive cough.    Also needed her nebulizer machine replaced.   Hers is broken Frequency: Started last week with wheezing,   Also has a runny nose and post nasal drip Pertinent Negatives: Patient denies fever Disposition: [] ED /[] Urgent Care (no appt availability in office) / [x] Appointment(In office/virtual)/ []  [place] [facility]/ [] Home Care/ [] Refused Recommended Disposition /[] [facility]/ []  Follow-up with PCP Additional Notes: Appt made with Debera Lat, PA-C for 09/12/2023 at 1:00.

## 2023-09-12 ENCOUNTER — Other Ambulatory Visit: Payer: Self-pay | Admitting: Physician Assistant

## 2023-09-12 ENCOUNTER — Ambulatory Visit (INDEPENDENT_AMBULATORY_CARE_PROVIDER_SITE_OTHER): Payer: Medicare Other | Admitting: Physician Assistant

## 2023-09-12 ENCOUNTER — Encounter: Payer: Self-pay | Admitting: Physician Assistant

## 2023-09-12 ENCOUNTER — Telehealth: Payer: Self-pay | Admitting: *Deleted

## 2023-09-12 ENCOUNTER — Telehealth: Payer: Self-pay

## 2023-09-12 VITALS — BP 140/95 | HR 91 | Ht 59.0 in | Wt 125.5 lb

## 2023-09-12 DIAGNOSIS — J45901 Unspecified asthma with (acute) exacerbation: Secondary | ICD-10-CM

## 2023-09-12 DIAGNOSIS — G43009 Migraine without aura, not intractable, without status migrainosus: Secondary | ICD-10-CM

## 2023-09-12 MED ORDER — PREDNISONE 20 MG PO TABS
20.0000 mg | ORAL_TABLET | Freq: Every day | ORAL | 0 refills | Status: DC
Start: 2023-09-12 — End: 2023-09-23

## 2023-09-12 MED ORDER — ALBUTEROL SULFATE (2.5 MG/3ML) 0.083% IN NEBU: 2.5000 mg | INHALATION_SOLUTION | Freq: Four times a day (QID) | RESPIRATORY_TRACT | 1 refills | Status: DC | PRN

## 2023-09-12 MED ORDER — FLUTICASONE-SALMETEROL 250-50 MCG/ACT IN AEPB
1.0000 | INHALATION_SPRAY | Freq: Two times a day (BID) | RESPIRATORY_TRACT | 1 refills | Status: DC
Start: 2023-09-12 — End: 2023-11-27

## 2023-09-12 NOTE — Progress Notes (Unsigned)
Complex Care Management Note Care Guide Note  09/12/2023 Name: Karen [name] MRN: [id number] DOB: 28-Nov-1975   Complex Care Management Outreach Attempts: An unsuccessful telephone outreach was attempted today to offer the patient information about available complex care management services.  Follow Up Plan:  Additional outreach attempts will be made to offer the patient complex care management information and services.   Encounter Outcome:  No Answer  Burman Nieves, CMA, Care Guide [facility]  [facility], [facility] Guide Direct Dial: [phone]  Fax: [phone] Website: [email or web address]

## 2023-09-13 ENCOUNTER — Telehealth: Payer: Self-pay

## 2023-09-13 NOTE — Telephone Encounter (Signed)
Pt has been scheduled for March 4th with Dr Tobi Bastos... ED precautions given to pt, she expressed understanding

## 2023-09-13 NOTE — Telephone Encounter (Signed)
Pt called requesting to be seen for an OV, [place]ohl pt.... Pt c/o intermittent epigastric pain with nausea... denies any other Sx at this time... Pt has continued to take Omeprazole BID, currently being prescribed by PCP  Will either of you see pt for an OV?  Thanks

## 2023-09-13 NOTE — Progress Notes (Unsigned)
Established patient visit  Patient: Karen [name]   DOB: Nov 28, 1975   48 y.o. Female  MRN: [id number] Visit Date: 09/12/2023  Today's healthcare provider: Debera Lat, PA-C   Chief Complaint  Patient presents with   Asthma    Asthma flare-up and wheezing, cough and shortness of breath Wheezing is bad more so at night and in the morning Got tested for flu, covid, and rsv and came back negative 1/10 pain scale   Subjective     HPI     Asthma    Additional comments: Asthma flare-up and wheezing, cough and shortness of breath Wheezing is bad more so at night and in the morning Got tested for flu, covid, and rsv and came back negative 1/10 pain scale      Last edited by Bevelyn Ngo, CMA on 09/12/2023  1:18 PM.       Discussed the use of AI scribe software for clinical note transcription with the patient, who gave verbal consent to proceed.  History of Present Illness   The patient, with a history of asthma, presents with a three-week history of wheezing, coughing, and shortness of breath. They report chest congestion and postnasal drainage, which they believe is contributing to their cough. They deny any associated symptoms such as headache, dizziness, or fever. They have been using an albuterol inhaler, which they report is no longer effective. They have previously used a nebulizer and Advair, but they are not currently on these treatments. They have not been on any maintenance inhaler recently. They also report a history of allergies, including to Keflex, amoxicillin, NSAIDs, and strawberry extract.           09/12/2023    1:34 PM 05/26/2023   10:38 AM 05/09/2023    2:50 PM  Depression screen PHQ 2/9  Decreased Interest 1 1 1   Down, Depressed, Hopeless 3 3 2   PHQ - 2 Score 4 4 3   Altered sleeping 3 3 1   Tired, decreased energy 3 3 3   Change in appetite 1 3 3   Feeling bad or failure about yourself  1 1 3   Trouble concentrating 1 3 3   Moving slowly or  fidgety/restless 0 0 0  Suicidal thoughts 0 0 0  PHQ-9 Score 13 17 16       09/12/2023    1:35 PM 05/26/2023   10:38 AM 05/09/2023    2:50 PM 04/14/2023    1:23 PM  GAD 7 : Generalized Anxiety Score  Nervous, Anxious, on Edge 1 1 3 1   Control/stop worrying 3 3 3 3   Worry too much - different things 3 3 3 3   Trouble relaxing 0 0 0 3  Restless 0 0 0 1  Easily annoyed or irritable 1 3 3 3   Afraid - awful might happen 1 1 2  0  Total GAD 7 Score 9 11 14 14   Anxiety Difficulty Somewhat difficult   Very difficult    Medications: Outpatient Medications Prior to Visit  Medication Sig   albuterol (VENTOLIN HFA) 108 (90 Base) MCG/ACT inhaler Inhale 2 puffs into the lungs every 6 (six) hours as needed.   Armodafinil 150 MG tablet TAKE 1 TABLET BY MOUTH EVERY DAY   busPIRone (BUSPAR) 10 MG tablet TAKE 1 TABLET (10 MG TOTAL) BY MOUTH 2 (TWO) TIMES DAILY. TAKE ALONG WITH 15MG  TABLET TWICE A DAY   busPIRone (BUSPAR) 15 MG tablet Take 1 tablet (15 mg total) by mouth 2 (two) times daily.   celecoxib (CELEBREX) 200  MG capsule Take 1 capsule (200 mg total) by mouth 2 (two) times daily.   Ciclopirox 8 % KIT Apply 1 application  topically daily.   diclofenac Sodium (VOLTAREN) 1 % GEL APPLY AS NEEDED 3 TIMES A DAY   DULoxetine (CYMBALTA) 60 MG capsule TAKE 2 CAPSULES BY MOUTH DAILY   fentaNYL (DURAGESIC) 100 MCG/HR Place 1 patch onto the skin every 3 (three) days.   fexofenadine (ALLEGRA) 180 MG tablet Take 180 mg by mouth daily.   gabapentin (NEURONTIN) 800 MG tablet TAKE 1 TABLET BY MOUTH THREE TIMES A DAY   haloperidol (HALDOL) 0.5 MG tablet TAKE 1/2 OF A TABLET (0.25 MG TOTAL) BY MOUTH TWICE A DAY   lurasidone (LATUDA) 80 MG TABS tablet Take 1 tablet (80 mg total) by mouth daily with supper.   methocarbamol (ROBAXIN) 500 MG tablet Take 1 tablet (500 mg total) by mouth every 8 (eight) hours as needed.   mometasone (ELOCON) 0.1 % cream Apply to rash BID up to two weeks.   naloxone (NARCAN) nasal spray  4 mg/0.1 mL Place 1 spray into the nose as needed.   omeprazole (PRILOSEC) 40 MG capsule TAKE 1 CAPSULE BY MOUTH TWICE A DAY   ondansetron (ZOFRAN-ODT) 4 MG disintegrating tablet DISSOLVE 1 TABLET IN MOUTH EVERY 8 HOURS FOR NAUSEA AND VOMITING   QUEtiapine (SEROQUEL) 100 MG tablet Take 1 tablet (100 mg total) by mouth at bedtime.   No facility-administered medications prior to visit.    Review of Systems All negative Except see HPI   {Insert previous labs (optional):23779} {See past labs  Heme  Chem  Endocrine  Serology  Results Review (optional):1}   Objective    BP (!) 140/95   Pulse 91   Ht 4\' 11"  (1.499 m)   Wt 125 lb 8 oz (56.9 kg)   LMP 08/23/2023 (Approximate)   SpO2 100%   BMI 25.35 kg/m  {Insert last BP/Wt (optional):23777}{See vitals history (optional):1}   Physical Exam   No results found for any visits on 09/12/23.      Assessment and Plan    Asthma Exacerbation No fever. Chest congestion, abnormal lung sounds and postnasal drainage noted. -Prescribe Albuterol nebulizer for immediate use. -Prescribe Prednisone for five days. -Advise not to use Albuterol inhaler and nebulizer together. -Start Advair after Prednisone course ends. -Contact the office immediately if fever develops.  Allergic Rhinitis Itchy eyes, nasal congestion, and postnasal drainage. -Continue current management.  Follow-up -Contact the office with any questions or concerns. -Report any onset of fever immediately.        No orders of the defined types were placed in this encounter.   No follow-ups on file.   The patient was advised to call back or seek an in-person evaluation if the symptoms worsen or if the condition fails to improve as anticipated.  I discussed the assessment and treatment plan with the patient. The patient was provided an opportunity to ask questions and all were answered. The patient agreed with the plan and demonstrated an understanding of the  instructions.  I, Debera Lat, PA-C have reviewed all documentation for this visit. The documentation on 09/12/2023  for the exam, diagnosis, procedures, and orders are all accurate and complete.  Debera Lat, [facility], MMS [facility] [phone] (phone) [phone] (fax)  [facility]

## 2023-09-13 NOTE — Progress Notes (Signed)
Complex Care Management Note  Care Guide Note 09/13/2023 Name: VEVERLY LARIMER MRN: [id number] DOB: 11-May-1976  SHARINA PETRE is a 48 y.o. year old female who sees Fisher, Demetrios Isaacs, MD for primary care. I reached out to [name] by phone today to offer complex care management services.  Ms. Fayson was given information about Complex Care Management services today including:   The Complex Care Management services include support from the care team which includes your Nurse Coordinator, Clinical Social Worker, or Pharmacist.  The Complex Care Management team is here to help remove barriers to the health concerns and goals most important to you. Complex Care Management services are voluntary, and the patient may decline or stop services at any time by request to their care team member.   Complex Care Management Consent Status: Patient agreed to services and verbal consent obtained.   Follow up plan:  Telephone appointment with complex care management team member scheduled for:  09/20/2023  Encounter Outcome:  Patient Scheduled  Burman Nieves, CMA, Care Guide [facility]  [facility], [facility] Guide Direct Dial: [phone]  Fax: [phone] Website: [email or web address]

## 2023-09-13 NOTE — Telephone Encounter (Signed)
Copied from CRM [id number]. Topic: General - Other >> Sep 11, 2023  1:56 PM Turkey B wrote: Reason for CRM: pt called in states needs new asthma machine, her is broken. She needs this asap.

## 2023-09-14 ENCOUNTER — Telehealth: Payer: Self-pay | Admitting: Family Medicine

## 2023-09-14 ENCOUNTER — Other Ambulatory Visit: Payer: Self-pay | Admitting: Family Medicine

## 2023-09-14 ENCOUNTER — Telehealth: Payer: Self-pay

## 2023-09-14 DIAGNOSIS — J45901 Unspecified asthma with (acute) exacerbation: Secondary | ICD-10-CM

## 2023-09-14 NOTE — Addendum Note (Signed)
Addended by: Debera Lat on: 09/14/2023 04:33 PM   Modules accepted: Orders

## 2023-09-14 NOTE — Telephone Encounter (Signed)
Requested medication (s) are due for refill today: No  Requested medication (s) are on the active medication list: Yes  Last refill:  09/12/23  Future visit scheduled: Yes  Notes to clinic:  See pharmacy request.    Requested Prescriptions  Pending Prescriptions Disp Refills   levalbuterol (XOPENEX) 1.25 MG/3ML nebulizer solution [Pharmacy Med Name: LEVALBUTEROL 1.25 MG/3 ML SOL]  0     Pulmonology:  Beta Agonists 2 Failed - 09/14/2023 12:39 PM      Failed - Last BP in normal range    BP Readings from Last 1 Encounters:  09/12/23 (!) 140/95         Passed - Last Heart Rate in normal range    Pulse Readings from Last 1 Encounters:  09/12/23 91         Passed - Valid encounter within last 12 months    Recent Outpatient Visits           2 days ago Exacerbation of persistent asthma, unspecified asthma severity   [place] [facility] [place], [place], PA-C   3 months ago COVID-19 ruled out by laboratory testing   [facility] Jacky Kindle, FNP   4 months ago Sore throat   [place] [facility] [place], [place], PA-C   5 months ago Thyroid nodule   [facility] Malva Limes, MD   6 months ago Acute cystitis with hematuria   [facility] [place], Edmon Crape, PA-C       Future Appointments             In 1 month Wyline Mood, MD [facility] [place] [facility] at [facility]

## 2023-09-14 NOTE — Telephone Encounter (Signed)
Was sent to Dr F but he is out.  You just saw her recently for asthma, would you be able to order this please.   Copied from CRM [id number]. Topic: General - Inquiry >> Sep 14, 2023  2:00 PM Karen [name] wrote: Reason for CRM: pt called saying she needs a new nebulizer.  Hers is broken.  She needs the nebulizer treatment to get better so she can take her ECT treatment.  She was given the solution but not the new nebulizer.  She uses [facility] [employer] [facility]. The number is [phone]  Pt's [phone]

## 2023-09-15 NOTE — Telephone Encounter (Signed)
What DME company does she go through to get her nebulizer and supplies

## 2023-09-15 NOTE — Telephone Encounter (Signed)
Requested Prescriptions  Pending Prescriptions Disp Refills   DULoxetine (CYMBALTA) 60 MG capsule [Pharmacy Med Name: DULOXETINE HCL DR 60 MG CAP] 180 capsule 0    Sig: TAKE 2 CAPSULES BY MOUTH DAILY     Psychiatry: Antidepressants - SNRI - duloxetine Failed - 09/15/2023  1:12 PM      Failed - Cr in normal range and within 360 days    Creatinine  Date Value Ref Range Status  02/19/2014 0.82 0.60 - 1.30 mg/dL Final   Creatinine, Ser  Date Value Ref Range Status  08/02/2022 1.06 (H) 0.57 - 1.00 mg/dL Final         Failed - eGFR is 30 or above and within 360 days    EGFR (African American)  Date Value Ref Range Status  02/19/2014 >60  Final   GFR calc Af Amer  Date Value Ref Range Status  10/15/2019 80 >59 mL/min/1.73 Final   EGFR (Non-African Amer.)  Date Value Ref Range Status  02/19/2014 >60  Final    Comment:    eGFR values <63mL/min/1.73 m2 may be an indication of chronic kidney disease (CKD). Calculated eGFR is useful in patients with stable renal function. The eGFR calculation will not be reliable in acutely ill patients when serum creatinine is changing rapidly. It is not useful in  patients on dialysis. The eGFR calculation may not be applicable to patients at the low and high extremes of body sizes, pregnant women, and vegetarians.    GFR, Estimated  Date Value Ref Range Status  07/10/2022 >60 >60 mL/min Final    Comment:    (NOTE) Calculated using the CKD-EPI Creatinine Equation (2021)    eGFR  Date Value Ref Range Status  08/02/2022 66 >59 mL/min/1.73 Final         Failed - Last BP in normal range    BP Readings from Last 1 Encounters:  09/12/23 (!) 140/95         Passed - Completed PHQ-2 or PHQ-9 in the last 360 days      Passed - Valid encounter within last 6 months    Recent Outpatient Visits           3 days ago Exacerbation of persistent asthma, unspecified asthma severity   [facility] Raub, [place], PA-C   3  months ago COVID-19 ruled out by laboratory testing   [facility] Jacky Kindle, FNP   4 months ago Sore throat   [place] [facility] [place], [place], PA-C   5 months ago Thyroid nodule   [facility] Malva Limes, MD   6 months ago Acute cystitis with hematuria   [facility] [place], Edmon Crape, PA-C       Future Appointments             In 1 month Wyline Mood, MD [facility]  [facility]astroenterology at [facility] Prescriptions Disp Refills   gabapentin (NEURONTIN) 800 MG tablet [Pharmacy Med Name: GABAPENTIN 800 MG TABLET] 90 tablet 11    Sig: TAKE 1 TABLET BY MOUTH THREE TIMES A DAY     Neurology: Anticonvulsants - gabapentin Failed - 09/15/2023  1:12 PM      Failed - Cr in normal range and within 360 days    Creatinine  Date Value Ref Range Status  02/19/2014 0.82 0.60 - 1.30 mg/dL Final   Creatinine,  Ser  Date Value Ref Range Status  08/02/2022 1.06 (H) 0.57 - 1.00 mg/dL Final         Passed - Completed PHQ-2 or PHQ-9 in the last 360 days      Passed - Valid encounter within last 12 months    Recent Outpatient Visits           3 days ago Exacerbation of persistent asthma, unspecified asthma severity   [facility] [place], [place], PA-C   3 months ago COVID-19 ruled out by laboratory testing   [facility] Jacky Kindle, FNP   4 months ago Sore throat   [facility] [facility] [place], [place], PA-C   5 months ago Thyroid nodule   [facility] Malva Limes, MD   6 months ago Acute cystitis with hematuria   [facility] Debera Lat, PA-C       Future Appointments             In 1 month Wyline Mood, MD [facility] at [facility]

## 2023-09-15 NOTE — Telephone Encounter (Signed)
Requested medications are due for refill today.  yes  Requested medications are on the active medications list.  yes  Last refill. 06/07/2023 #180 0   Future visit scheduled.   no  Notes to clinic.  Labs are expired.    Requested Prescriptions  Pending Prescriptions Disp Refills   DULoxetine (CYMBALTA) 60 MG capsule [Pharmacy Med Name: DULOXETINE HCL DR 60 MG CAP] 180 capsule 0    Sig: TAKE 2 CAPSULES BY MOUTH DAILY     Psychiatry: Antidepressants - SNRI - duloxetine Failed - 09/15/2023  1:12 PM      Failed - Cr in normal range and within 360 days    Creatinine  Date Value Ref Range Status  02/19/2014 0.82 0.60 - 1.30 mg/dL Final   Creatinine, Ser  Date Value Ref Range Status  08/02/2022 1.06 (H) 0.57 - 1.00 mg/dL Final         Failed - eGFR is 30 or above and within 360 days    EGFR (African American)  Date Value Ref Range Status  02/19/2014 >60  Final   GFR calc Af Amer  Date Value Ref Range Status  10/15/2019 80 >59 mL/min/1.73 Final   EGFR (Non-African Amer.)  Date Value Ref Range Status  02/19/2014 >60  Final    Comment:    eGFR values <34mL/min/1.73 m2 may be an indication of chronic kidney disease (CKD). Calculated eGFR is useful in patients with stable renal function. The eGFR calculation will not be reliable in acutely ill patients when serum creatinine is changing rapidly. It is not useful in  patients on dialysis. The eGFR calculation may not be applicable to patients at the low and high extremes of body sizes, pregnant women, and vegetarians.    GFR, Estimated  Date Value Ref Range Status  07/10/2022 >60 >60 mL/min Final    Comment:    (NOTE) Calculated using the CKD-EPI Creatinine Equation (2021)    eGFR  Date Value Ref Range Status  08/02/2022 66 >59 mL/min/1.73 Final         Failed - Last BP in normal range    BP Readings from Last 1 Encounters:  09/12/23 (!) 140/95         Passed - Completed PHQ-2 or PHQ-9 in the last 360 days       Passed - Valid encounter within last 6 months    Recent Outpatient Visits           3 days ago Exacerbation of persistent asthma, unspecified asthma severity   [place] [facility] [place], [place], PA-C   3 months ago COVID-19 ruled out by laboratory testing   [facility] Jacky Kindle, FNP   4 months ago Sore throat   [place] [facility] [place], [place], PA-C   5 months ago Thyroid nodule   [facility] Limes, MD   6 months ago Acute cystitis with hematuria   [place] [facility] Debera Lat, PA-C       Future Appointments             In 1 month Wyline Mood, MD [facility] [place] [facility] at [facility] Prescriptions Disp Refills   gabapentin (NEURONTIN) 800 MG tablet [Pharmacy Med Name: GABAPENTIN 800 MG TABLET] 90 tablet 11    Sig: TAKE 1 TABLET BY MOUTH THREE TIMES A DAY     Neurology:  Anticonvulsants - gabapentin Failed - 09/15/2023  1:12 PM      Failed - Cr in normal range and within 360 days    Creatinine  Date Value Ref Range Status  02/19/2014 0.82 0.60 - 1.30 mg/dL Final   Creatinine, Ser  Date Value Ref Range Status  08/02/2022 1.06 (H) 0.57 - 1.00 mg/dL Final         Passed - Completed PHQ-2 or PHQ-9 in the last 360 days      Passed - Valid encounter within last 12 months    Recent Outpatient Visits           3 days ago Exacerbation of persistent asthma, unspecified asthma severity   [place] [facility] [place], [place], PA-C   3 months ago COVID-19 ruled out by laboratory testing   [facility] Jacky Kindle, FNP   4 months ago Sore throat   [place] [facility] [place], [place], PA-C   5 months ago Thyroid nodule   [facility] Malva Limes, MD   6 months ago Acute cystitis with hematuria   [facility]  [facility] [place], Edmon Crape, PA-C       Future Appointments             In 1 month Wyline Mood, MD [facility] [place] [facility] at [facility]

## 2023-09-20 ENCOUNTER — Ambulatory Visit: Payer: Self-pay | Admitting: *Deleted

## 2023-09-20 DIAGNOSIS — Z880 Allergy status to penicillin: Secondary | ICD-10-CM | POA: Diagnosis not present

## 2023-09-20 DIAGNOSIS — R569 Unspecified convulsions: Secondary | ICD-10-CM | POA: Diagnosis not present

## 2023-09-20 DIAGNOSIS — G4730 Sleep apnea, unspecified: Secondary | ICD-10-CM | POA: Diagnosis not present

## 2023-09-20 DIAGNOSIS — F333 Major depressive disorder, recurrent, severe with psychotic symptoms: Secondary | ICD-10-CM | POA: Diagnosis not present

## 2023-09-20 DIAGNOSIS — Z881 Allergy status to other antibiotic agents status: Secondary | ICD-10-CM | POA: Diagnosis not present

## 2023-09-20 DIAGNOSIS — J45909 Unspecified asthma, uncomplicated: Secondary | ICD-10-CM | POA: Diagnosis not present

## 2023-09-20 DIAGNOSIS — Z885 Allergy status to narcotic agent status: Secondary | ICD-10-CM | POA: Diagnosis not present

## 2023-09-21 ENCOUNTER — Other Ambulatory Visit: Payer: Self-pay | Admitting: Family Medicine

## 2023-09-21 NOTE — Patient Outreach (Addendum)
  Care Coordination   Initial Visit Note   09/21/2023 Name: Karen [name] MRN: [id number] DOB: 12-13-1975  CHITARA CLONCH is a 48 y.o. year old female who sees Fisher, Nancyann BRAVO, MD for primary care. I spoke with  Berwyn JINNY Nora by phone on 09/20/23.  What matters to the patients health and wellness today?  EMMI referral received. Patient confirms diagnosis of depression and is active with [facility]. She is also receiving ECT . Patient denies thoughts of harm to herself or others. Patient encouraged to continue to follow up with current mental health providers. Food bank resources to be mailed to patient's home.    Goals Addressed             This Visit's Progress    community resources to assist with tree removal and food resources       Activities and task to complete in order to accomplish goals.   TASK TO ACCOMPLISH GOAL CSW will mail food bank resources to you by mail Please contact the [facility] for possible assistance with tree removal Please contact the company of choice for possible assistance with wheelchair Medical Sales Representative companies: [employer] [phone] [employer] [phone] Andra [phone] EMOTIONAL / MENTAL HEALTH SUPPORT Keep all upcoming appointment discussed today Continue with compliance of taking medication prescribed by Doctor Self Support options  (continue with ECT in [facility] as well as follow up with [facility] for ongoing mental health counseling and medication management)         SDOH assessments and interventions completed:  Yes  SDOH Interventions Today    Flowsheet Row Most Recent Value  SDOH Interventions   Food Insecurity Interventions --  [will send list of food bank options]  Housing Interventions Intervention Not Indicated  Transportation Interventions Intervention Not Indicated  Utilities Interventions Other  (Comment)  [currently working with the [employer] to get the bill paid]        Care Coordination Interventions:  Yes, provided  Interventions Today    Flowsheet Row Most Recent Value  Chronic Disease   Chronic disease during today's visit Other  [EMMI call for depression]  General Interventions   General Interventions Discussed/Reviewed General Interventions Discussed, Community Resources  [facility] referral received for depression-patient assessed for community resource/mental health needs. Pt's mother present-discussed need for food resources and assistance with tree removal and w/c lift repair]  Education Interventions   Education Provided Provided Education  Provided Verbal Education On Community Resources  [facility] [employer] for possible assistance with tree removal [phone]]  Mental Health Interventions   Mental Health Discussed/Reviewed Mental Health Discussed  [patient confirms DX of depression, currently receiving ECT in [place]-mother proivided transport-patient active with [facility] as well-therapist Sharolyn Calin, PA [facility] for medication mgmt]  Nutrition Interventions   Nutrition Discussed/Reviewed Nutrition Discussed  [discussed need for resources for local food banks-CSW will send in mail]  Safety Interventions   Safety Discussed/Reviewed Safety Discussed  [patient encouraged to call [phone] in the event of a mental health emergency]       Follow up plan: No further intervention required.   Encounter Outcome:  Patient Visit Completed

## 2023-09-21 NOTE — Patient Instructions (Addendum)
 Visit Information  Thank you for taking time to visit with me today. Please don't hesitate to contact me if I can be of assistance to you.   Following are the goals we discussed today:   Goals Addressed             This Visit's Progress    community resources to assist with tree removal and food resources       Activities and task to complete in order to accomplish goals.   TASK TO ACCOMPLISH GOAL CSW will mail food bank resources to you by mail Please contact the [facility] for possible assistance with tree removal Please contact the company of choice for possible assistance with wheelchair Medical Sales Representative companies: [employer] [phone] [employer] [phone] Andra [phone] EMOTIONAL / MENTAL HEALTH SUPPORT Keep all upcoming appointment discussed today Continue with compliance of taking medication prescribed by Doctor Self Support options  (continue with ECT in [facility] as well as follow up with [facility] for ongoing mental health counseling and medication management)        If you are experiencing a Mental Health or Behavioral Health Crisis or need someone to talk to, please call the Suicide and Crisis Lifeline: 988   Patient verbalizes understanding of instructions and care plan provided today and agrees to view in MyChart. Active MyChart status and patient understanding of how to access instructions and care plan via MyChart confirmed with patient.     No further follow up required: patient to contact this child psychotherapist with any additional community resource needs  [employer], [employer]   [facility], [facility] Licensed Clinical Social Geologist, Engineering Dial: [phone]

## 2023-09-22 NOTE — Telephone Encounter (Signed)
 S[facility] to [facility].  Requested Prescriptions  Pending Prescriptions Disp Refills   gabapentin  (NEURONTIN ) 800 MG tablet [Pharmacy Med Name: GABAPENTIN  800 MG TABLET] 270 tablet 0    Sig: TAKE 1 TABLET BY MOUTH THREE TIMES A DAY     Neurology: Anticonvulsants - gabapentin  Failed - 09/22/2023 11:46 AM      Failed - Cr in normal range and within 360 days    Creatinine  Date Value Ref Range Status  02/19/2014 0.82 0.60 - 1.30 mg/dL Final   Creatinine, Ser  Date Value Ref Range Status  08/02/2022 1.06 (H) 0.57 - 1.00 mg/dL Final         Passed - Completed PHQ-2 or PHQ-9 in the last 360 days      Passed - Valid encounter within last 12 months    Recent Outpatient Visits           1 week ago Exacerbation of persistent asthma, unspecified asthma severity   [facility], [place], PA-C   3 months ago COVID-19 ruled out by laboratory testing   [facility] Emilio Kelly DASEN, FNP   4 months ago Sore throat   [place] [facility] [place], [place], PA-C   5 months ago Thyroid  nodule   [facility] Nancyann BRAVO, MD   6 months ago Acute cystitis with hematuria   [place] [facility] [place], Jolynn, PA-C       Future Appointments             In 3 weeks Therisa Bi, MD [facility]

## 2023-09-27 DIAGNOSIS — R569 Unspecified convulsions: Secondary | ICD-10-CM | POA: Diagnosis not present

## 2023-09-27 DIAGNOSIS — F333 Major depressive disorder, recurrent, severe with psychotic symptoms: Secondary | ICD-10-CM | POA: Diagnosis not present

## 2023-09-27 DIAGNOSIS — J45909 Unspecified asthma, uncomplicated: Secondary | ICD-10-CM | POA: Diagnosis not present

## 2023-09-27 DIAGNOSIS — F323 Major depressive disorder, single episode, severe with psychotic features: Secondary | ICD-10-CM | POA: Diagnosis not present

## 2023-09-27 DIAGNOSIS — G4730 Sleep apnea, unspecified: Secondary | ICD-10-CM | POA: Diagnosis not present

## 2023-09-28 ENCOUNTER — Other Ambulatory Visit: Payer: Self-pay

## 2023-09-28 ENCOUNTER — Emergency Department (HOSPITAL_COMMUNITY)
Admission: EM | Admit: 2023-09-28 | Discharge: 2023-10-02 | Disposition: A | Discharge status: Other Acute Inpatient Hospital | Payer: Medicare Other | Attending: Student | Admitting: Student

## 2023-09-28 ENCOUNTER — Ambulatory Visit (INDEPENDENT_AMBULATORY_CARE_PROVIDER_SITE_OTHER)
Admission: EM | Admit: 2023-09-28 | Discharge: 2023-09-28 | Disposition: A | Discharge status: Other Acute Inpatient Hospital | Payer: Medicare Other | Source: Home / Self Care

## 2023-09-28 ENCOUNTER — Other Ambulatory Visit: Payer: Self-pay | Admitting: Family Medicine

## 2023-09-28 DIAGNOSIS — R45851 Suicidal ideations: Secondary | ICD-10-CM | POA: Diagnosis not present

## 2023-09-28 DIAGNOSIS — G4710 Hypersomnia, unspecified: Secondary | ICD-10-CM

## 2023-09-28 DIAGNOSIS — F29 Unspecified psychosis not due to a substance or known physiological condition: Secondary | ICD-10-CM | POA: Diagnosis present

## 2023-09-28 DIAGNOSIS — F411 Generalized anxiety disorder: Secondary | ICD-10-CM | POA: Diagnosis not present

## 2023-09-28 DIAGNOSIS — Y900 Blood alcohol level of less than 20 mg/100 ml: Secondary | ICD-10-CM | POA: Insufficient documentation

## 2023-09-28 DIAGNOSIS — Z87820 Personal history of traumatic brain injury: Secondary | ICD-10-CM | POA: Insufficient documentation

## 2023-09-28 DIAGNOSIS — R442 Other hallucinations: Secondary | ICD-10-CM | POA: Diagnosis not present

## 2023-09-28 DIAGNOSIS — F32A Depression, unspecified: Secondary | ICD-10-CM | POA: Insufficient documentation

## 2023-09-28 DIAGNOSIS — R443 Hallucinations, unspecified: Secondary | ICD-10-CM | POA: Diagnosis not present

## 2023-09-28 DIAGNOSIS — Z79899 Other long term (current) drug therapy: Secondary | ICD-10-CM | POA: Insufficient documentation

## 2023-09-28 DIAGNOSIS — F333 Major depressive disorder, recurrent, severe with psychotic symptoms: Secondary | ICD-10-CM | POA: Diagnosis present

## 2023-09-28 DIAGNOSIS — F331 Major depressive disorder, recurrent, moderate: Secondary | ICD-10-CM | POA: Diagnosis not present

## 2023-09-28 DIAGNOSIS — R45850 Homicidal ideations: Secondary | ICD-10-CM | POA: Insufficient documentation

## 2023-09-28 DIAGNOSIS — M5136 Other intervertebral disc degeneration, lumbar region with discogenic back pain only: Secondary | ICD-10-CM

## 2023-09-28 DIAGNOSIS — F419 Anxiety disorder, unspecified: Secondary | ICD-10-CM | POA: Diagnosis not present

## 2023-09-28 DIAGNOSIS — R440 Auditory hallucinations: Secondary | ICD-10-CM | POA: Insufficient documentation

## 2023-09-28 DIAGNOSIS — F329 Major depressive disorder, single episode, unspecified: Secondary | ICD-10-CM | POA: Insufficient documentation

## 2023-09-28 NOTE — ED Notes (Signed)
Report given to charge RN [email or web address]  ed
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
## 2023-09-28 NOTE — Progress Notes (Signed)
   09/28/23 2002  [facility] Triage Screening (Walk-ins at [facility] only)  What Is the Reason for Your Visit/Call Today? Pt was brought to [facility] voluntarily by her mother, Noele Icenhour [phone].  Pt has a hx of TBI.  She has had two separate TBIs 3 years apart when she was in high school (1992 & 1995).  Per her chart she has had a seizure d/o.  She has been diagnosed with Acute Metabolic Encephaly.  Per mother pt lost a lot of weight since August '22 due to not eating due to her delusional thinking.  Pt has been hospitalized at [facility] several times since August '22 for thes same symptoms.  Patient gets ECT [facility] from [facility] last treatment was yesterday (02/12).   Currently patient is having hallucinations.  She says that she has been having auditory hallucinations for the last few months.  Voices tellng her to hurt people and kill her pets.  Patient says that she would never harm other people or pets. Pt says that the voice tells her to hurt her mother. Mother is conserned about patient not telling her about the hallucinations recently and she feels she cannot trust her daughter not to harm them.  Pt lives with mother.  She has two siblings but they are not close by.  Pt says "I would rather kill mysefl before harming my pets."  Pt has had SI and plan to use a knife or overdose on pills.  No previous suicide attempts. Pt denies any visual hallucinations.  Pt says that she has had less of an appetite due to nausea and pain.  She thinks she has ulcers.  Patient usually gets around 6 hours.  She has neural sleep apnea and uses a bi-pap.  She has not used it in a year but did last night.  Pt is seen by a PA Kerin Salen who is a Paramedic and another named Gaye Pollack with [facility].  How Long Has This Been Causing You Problems? 1-6 months  Have You Recently Had Any Thoughts About Hurting Yourself? Yes  How long ago did you have thoughts about hurting yourself? has had thoughts today.  Are You  Planning to Commit Suicide/Harm Yourself At This time? No  Have you Recently Had Thoughts About Hurting Someone Karolee Ohs? No  Are You Planning To Harm Someone At This Time? No  Physical Abuse Denies  Verbal Abuse Denies  Sexual Abuse Denies  Exploitation of patient/patient's resources Denies  Self-Neglect Denies  Possible abuse reported to:  (None)  Are you currently experiencing any auditory, visual or other hallucinations? Yes  Please explain the hallucinations you are currently experiencing: Hearing voices  Have You Used Any Alcohol or Drugs in the Past 24 Hours? No  Do you have any current medical co-morbidities that require immediate attention? Yes  Clinician description of patient physical appearance/behavior: Pt is seated in a wheelchair.  Pt is thin and her eye contact is good.  She is oriented x4.  She has insight into her thought process.  What Do You Feel Would Help You the Most Today? Treatment for Depression or other mood problem  If access to [facility] Urgent Care was not available, would you have sought care in the Emergency Department? Yes  Determination of Need Urgent (48 hours)  Options For Referral [facility] Urgent Care;Facility-Based Crisis  Determination of Need filed? Yes

## 2023-09-28 NOTE — Telephone Encounter (Signed)
Medication Refill -  Most Recent Primary Care Visit:  Provider: Debera Lat  Department: [id number]  Visit Type: OFFICE VISIT  Date: 09/12/2023  Medication:  fentaNYL (DURAGESIC) 100 MCG/HR  Armodafinil 150 MG tablet   Has the patient contacted their pharmacy? No (Agent: If no, request that the patient contact the pharmacy for the refill. If patient does not wish to contact the pharmacy document the reason why and proceed with request.) (Agent: If yes, when and what did the pharmacy advise?)  Is this the correct pharmacy for this prescription? Yes If no, delete pharmacy and type the correct one.  This is the patient's preferred pharmacy:    [id number] [place], [place] - [address] [place] [address] Phone: [phone] Fax: [phone]  Has the prescription been filled recently? Yes  Is the patient out of the medication? Yes  Has the patient been seen for an appointment in the last year OR does the patient have an upcoming appointment? Yes  Can we respond through MyChart? Yes  Agent: Please be advised that Rx refills may take up to 3 business days. We ask that you follow-up with your pharmacy.

## 2023-09-28 NOTE — ED Provider Notes (Signed)
 Behavioral Health Urgent Care Medical Screening Exam  Patient Name: Karen [name] MRN: [id number] Date of Evaluation: 09/29/23 Chief Complaint: " I'm having dark thoughts". Diagnosis:  Final diagnoses:  Severe auditory hallucinations    History of Present illness: Karen [name] is a 48 y.o. female.  With psychiatric history of depression and anxiety, insomnia, paranoia, psychosis, catatonia, MDD and medical history significant for TBI, mixed sleep apnea, central predominant, seizure disorder, DJD, tics, and Ehlers-Danlos syndrome, presented voluntarily to [facility] by her mother [name] [phone] with complaints of command auditory hallucinations.  Patient was seen face-to-face by this provider and chart reviewed.  Patient gave permission for her mother to remain in the room during this evaluation.  On evaluation, patient is alert, oriented x 3, and cooperative. Speech is clear and coherent. Pt appears fairly groomed. Eye contact is good. Mood is anxious, affect is congruent with mood. Thought process is coherent and thought content is WDL. Pt denies SI/HI/VH. Patient endorses VH. There is no objective indication that the patient is responding to internal stimuli. No delusions elicited during this assessment.    Patient reports "I'm having dark thoughts about hurting my mom or one of my pets, I don't intend to do these things, and the voice is telling me to do these things and I'd like not to, the voices  says he is God but he's not".  Patient reports ongoing command hallucinations "at least 3 months, they come and go". Patient reports she also get weekly ECT sessions to help with her depression Pt is seen by a PA Kerin Salen who is a Paramedic and another named Gaye Pollack with [facility].   Patient reports her psychiatric medications include Latuda, Haldol, and Seroquel. Pt has had SI and plan to use a knife or overdose on pills in the past. No previous  suicide attempts.    Patient reports she uses a BiPAP for sleep and restarted for the first time in 2 years last night due to apnea. Patient lives with her mom. Mother is conserned about patient not telling her about the hallucinations recently and she feels she cannot trust her daughter not to harm them . Per mother pt lost a lot of weight since August '22 due to not eating due to her delusional thinking. Pt has been hospitalized at [facility] and [facility] several times since August '22 for thes same symptoms.   Patient's mother is requesting for the patient to not be sent to either [facility] or [facility] psych unit for inpatient psychiatric hospitalization.  Support, encouragement, reassurance provided about ongoing stressors.  Patient is provided with opportunity for questions.  Discussed recommendation for inpatient psychiatric admission for stabilization and treatment.   Discussed inpatient milieu and expectations.   Discussed recommendation for transfer to [facility] for safety monitoring pending transfer to an inpatient psychiatric unit, as patient needs higher level of care.   Patient and her mother are provided with opportunity for questions.  They both verbalized understanding and are in agreement.   Flowsheet Row ED from 09/28/2023 in [facility] Emergency Department at [facility] Most recent reading at 09/29/2023 12:49 AM ED from 09/28/2023 in [facility] Most recent reading at 09/28/2023  8:25 PM ED from 09/06/2023 in [facility] Emergency Department at [facility] Most recent reading at 09/06/2023  7:29 PM  C-SSRS RISK CATEGORY Low Risk Moderate Risk No Risk       Psychiatric Specialty Exam  Presentation  General  Appearance:Fairly Groomed  Eye Contact:Good  Speech:Clear and Coherent  Speech Volume:Normal  Handedness:Right   Mood and Affect  Mood: Anxious  Affect: Congruent   Thought Process  Thought  Processes: Coherent  Descriptions of Associations:Intact  Orientation:Full (Time, Place and Person)  Thought Content:WDL  Diagnosis of Schizophrenia or Schizoaffective disorder in past: No  Duration of Psychotic Symptoms: Less than six months  Hallucinations:Auditory Pt reports hearing voices asking her to kill her mom and her pet  Ideas of Reference:None  Suicidal Thoughts:No  Homicidal Thoughts:No   Sensorium  Memory: Immediate Fair  Judgment: Fair  Insight: Present   Executive Functions  Concentration: Fair  Attention Span: Fair  Recall: [employer] of Knowledge: Fair  Language: Fair   Psychomotor Activity  Psychomotor Activity: Normal   Assets  Assets: Communication Skills; Desire for Improvement; Social Support   Sleep  Sleep: Fair  Number of hours: No data recorded  Physical Exam: Physical Exam Constitutional:      General: She is not in acute distress.    Appearance: She is not diaphoretic.  HENT:     Nose: No congestion.  Cardiovascular:     Rate and Rhythm: Normal rate.  Pulmonary:     Effort: No respiratory distress.  Chest:     Chest wall: No tenderness.  Neurological:     Mental Status: She is alert and oriented to person, place, and time.  Psychiatric:        Attention and Perception: She perceives auditory hallucinations.        Mood and Affect: Mood is anxious.        Speech: Speech normal.        Behavior: Behavior is cooperative.        Thought Content: Thought content normal.    Review of Systems  Constitutional:  Negative for chills, diaphoresis and fever.  HENT:  Negative for congestion.   Eyes:  Negative for discharge.  Respiratory:  Negative for cough and shortness of breath.   Cardiovascular:  Negative for chest pain and palpitations.  Gastrointestinal:  Negative for diarrhea, nausea and vomiting.  Psychiatric/Behavioral:  Positive for hallucinations.    Blood pressure 121/81, pulse 90, temperature  98.8 F (37.1 C), resp. rate 18, last menstrual period 08/23/2023, SpO2 95%. There is no height or weight on file to calculate BMI.  Musculoskeletal: Strength & Muscle Tone: decreased Gait & Station:  wheelchair bound Patient leans:  in wheelchair   [facility] MSE Discharge Disposition for Follow up and Recommendations: On my evaluation, the patient does not appear to have an emergency medical condition but at this time is recommended for transfer to [facility] for safety monitoring pending availability of an inpatient psychiatric bed.  LCSW to seek bed placement. The patient's mother has requested the patient should not be sent to in-patient psychiatric units at [facility] or [facility] hospitals.   Recommend inpatient psychiatric admission for stabilization and treatment.  Patient uses a Bi-pap machine at bedtime, and also needs higher level of care, which will be best managed at [facility].  I consulted with Dr.Belfi at [facility] and the provider has agreed to accept the patient. EMTALA completed.  Patient discharged and transferred to [facility] in stable condition.  Mancel Bale, NP 09/29/2023, 2:11 AM    Isa Rankin, MD 10/17/23 [phone]

## 2023-09-28 NOTE — Discharge Instructions (Addendum)

## 2023-09-28 NOTE — ED Notes (Signed)
[employer] non emergency for request of [facility] transportation to [facility]

## 2023-09-29 ENCOUNTER — Encounter (HOSPITAL_COMMUNITY): Payer: Self-pay | Admitting: *Deleted

## 2023-09-29 ENCOUNTER — Other Ambulatory Visit: Payer: Self-pay

## 2023-09-29 DIAGNOSIS — F333 Major depressive disorder, recurrent, severe with psychotic symptoms: Secondary | ICD-10-CM | POA: Diagnosis not present

## 2023-09-29 LAB — CBC WITH DIFFERENTIAL/PLATELET
Abs Immature Granulocytes: 0.03 10*3/uL (ref 0.00–0.07)
Basophils Absolute: 0 10*3/uL (ref 0.0–0.1)
Basophils Relative: 0 %
Eosinophils Absolute: 0.5 10*3/uL (ref 0.0–0.5)
Eosinophils Relative: 6 %
HCT: 38.3 % (ref 36.0–46.0)
Hemoglobin: 11.8 g/dL — ABNORMAL LOW (ref 12.0–15.0)
Immature Granulocytes: 0 %
Lymphocytes Relative: 25 %
Lymphs Abs: 2.2 10*3/uL (ref 0.7–4.0)
MCH: 29.2 pg (ref 26.0–34.0)
MCHC: 30.8 g/dL (ref 30.0–36.0)
MCV: 94.8 fL (ref 80.0–100.0)
Monocytes Absolute: 0.6 10*3/uL (ref 0.1–1.0)
Monocytes Relative: 7 %
Neutro Abs: 5.5 10*3/uL (ref 1.7–7.7)
Neutrophils Relative %: 62 %
Platelets: 328 10*3/uL (ref 150–400)
RBC: 4.04 MIL/uL (ref 3.87–5.11)
RDW: 13.2 % (ref 11.5–15.5)
WBC: 8.8 10*3/uL (ref 4.0–10.5)
nRBC: 0 % (ref 0.0–0.2)

## 2023-09-29 LAB — URINALYSIS, ROUTINE W REFLEX MICROSCOPIC
Bilirubin Urine: NEGATIVE
Glucose, UA: NEGATIVE mg/dL
Hgb urine dipstick: NEGATIVE
Ketones, ur: NEGATIVE mg/dL
Leukocytes,Ua: NEGATIVE
Nitrite: NEGATIVE
Protein, ur: NEGATIVE mg/dL
Specific Gravity, Urine: 1.004 — ABNORMAL LOW (ref 1.005–1.030)
pH: 6 (ref 5.0–8.0)

## 2023-09-29 LAB — RAPID URINE DRUG SCREEN, HOSP PERFORMED
Amphetamines: NOT DETECTED
Barbiturates: NOT DETECTED
Benzodiazepines: NOT DETECTED
Cocaine: NOT DETECTED
Opiates: NOT DETECTED
Tetrahydrocannabinol: NOT DETECTED

## 2023-09-29 LAB — COMPREHENSIVE METABOLIC PANEL
ALT: 23 U/L (ref 0–44)
AST: 27 U/L (ref 15–41)
Albumin: 3.6 g/dL (ref 3.5–5.0)
Alkaline Phosphatase: 77 U/L (ref 38–126)
Anion gap: 7 (ref 5–15)
BUN: 10 mg/dL (ref 6–20)
CO2: 28 mmol/L (ref 22–32)
Calcium: 8.3 mg/dL — ABNORMAL LOW (ref 8.9–10.3)
Chloride: 98 mmol/L (ref 98–111)
Creatinine, Ser: 0.88 mg/dL (ref 0.44–1.00)
GFR, Estimated: >60 mL/min (ref >60)
Glucose, Bld: 102 mg/dL — ABNORMAL HIGH (ref 70–99)
Potassium: 3.9 mmol/L (ref 3.5–5.1)
Sodium: 133 mmol/L — ABNORMAL LOW (ref 135–145)
Total Bilirubin: 0.3 mg/dL (ref 0.0–1.2)
Total Protein: 6.4 g/dL — ABNORMAL LOW (ref 6.5–8.1)

## 2023-09-29 LAB — ETHANOL: Alcohol, Ethyl (B): <10 mg/dL (ref <10)

## 2023-09-29 LAB — HCG, SERUM, QUALITATIVE: Preg, Serum: NEGATIVE

## 2023-09-29 LAB — SALICYLATE LEVEL: Salicylate Lvl: <7 mg/dL — ABNORMAL LOW (ref 7.0–30.0)

## 2023-09-29 LAB — ACETAMINOPHEN LEVEL: Acetaminophen (Tylenol), Serum: <10 ug/mL — ABNORMAL LOW (ref 10–30)

## 2023-09-29 MED ORDER — LORATADINE 10 MG PO TABS
10.0000 mg | ORAL_TABLET | Freq: Every day | ORAL | Status: DC
  Administered 2023-09-29 – 2023-10-02 (×4): 10 mg via ORAL
  Filled 2023-09-29 (×4): qty 1

## 2023-09-29 MED ORDER — ARMODAFINIL 150 MG PO TABS: 150.0000 mg | ORAL_TABLET | Freq: Every day | ORAL | Status: DC

## 2023-09-29 MED ORDER — FENTANYL 100 MCG/HR TD PT72
1.0000 | MEDICATED_PATCH | TRANSDERMAL | Status: DC
  Administered 2023-09-30: 1 via TRANSDERMAL
  Filled 2023-09-29 (×2): qty 1

## 2023-09-29 MED ORDER — ALBUTEROL SULFATE (2.5 MG/3ML) 0.083% IN NEBU
2.5000 mg | INHALATION_SOLUTION | Freq: Four times a day (QID) | RESPIRATORY_TRACT | Status: DC | PRN
  Administered 2023-10-01: 2.5 mg via RESPIRATORY_TRACT
  Filled 2023-09-29: qty 3

## 2023-09-29 MED ORDER — DULOXETINE HCL 30 MG PO CPEP
120.0000 mg | ORAL_CAPSULE | Freq: Every day | ORAL | Status: DC
  Administered 2023-09-29 – 2023-10-02 (×4): 120 mg via ORAL
  Filled 2023-09-29 (×4): qty 4

## 2023-09-29 MED ORDER — DIPHENHYDRAMINE HCL 25 MG PO CAPS
25.0000 mg | ORAL_CAPSULE | Freq: Once | ORAL | Status: AC
  Administered 2023-09-29: 25 mg via ORAL
  Filled 2023-09-29: qty 1

## 2023-09-29 MED ORDER — QUETIAPINE FUMARATE 100 MG PO TABS
100.0000 mg | ORAL_TABLET | Freq: Every day | ORAL | Status: DC
  Administered 2023-09-29 – 2023-09-30 (×2): 100 mg via ORAL
  Filled 2023-09-29 (×2): qty 1

## 2023-09-29 MED ORDER — PANTOPRAZOLE SODIUM 40 MG PO TBEC
40.0000 mg | DELAYED_RELEASE_TABLET | Freq: Every day | ORAL | Status: DC
  Administered 2023-09-29 – 2023-10-02 (×4): 40 mg via ORAL
  Filled 2023-09-29 (×4): qty 1

## 2023-09-29 MED ORDER — MODAFINIL 200 MG PO TABS
200.0000 mg | ORAL_TABLET | Freq: Every day | ORAL | Status: DC
  Administered 2023-09-30 – 2023-10-02 (×3): 200 mg via ORAL
  Filled 2023-09-29 (×3): qty 1

## 2023-09-29 MED ORDER — LURASIDONE HCL 40 MG PO TABS
80.0000 mg | ORAL_TABLET | Freq: Every day | ORAL | Status: DC
  Administered 2023-09-30 – 2023-10-01 (×2): 80 mg via ORAL
  Filled 2023-09-29 (×3): qty 2

## 2023-09-29 MED ORDER — ACETAMINOPHEN 500 MG PO TABS
500.0000 mg | ORAL_TABLET | Freq: Once | ORAL | Status: AC
  Administered 2023-09-29: 500 mg via ORAL
  Filled 2023-09-29: qty 1

## 2023-09-29 MED ORDER — HALOPERIDOL 1 MG PO TABS
0.5000 mg | ORAL_TABLET | Freq: Two times a day (BID) | ORAL | Status: DC
  Administered 2023-09-29 – 2023-09-30 (×3): 0.5 mg via ORAL
  Filled 2023-09-29 (×3): qty 1

## 2023-09-29 MED ORDER — CELECOXIB 200 MG PO CAPS
200.0000 mg | ORAL_CAPSULE | Freq: Two times a day (BID) | ORAL | Status: DC
  Administered 2023-09-29 – 2023-10-02 (×6): 200 mg via ORAL
  Filled 2023-09-29 (×6): qty 1

## 2023-09-29 MED ORDER — GABAPENTIN 400 MG PO CAPS
800.0000 mg | ORAL_CAPSULE | Freq: Three times a day (TID) | ORAL | Status: DC
  Administered 2023-09-29 – 2023-10-02 (×9): 800 mg via ORAL
  Filled 2023-09-29 (×9): qty 2

## 2023-09-29 MED ORDER — ALBUTEROL SULFATE HFA 108 (90 BASE) MCG/ACT IN AERS: 2.0000 | INHALATION_SPRAY | Freq: Four times a day (QID) | RESPIRATORY_TRACT | Status: DC | PRN

## 2023-09-29 MED ORDER — ONDANSETRON 4 MG PO TBDP
4.0000 mg | ORAL_TABLET | Freq: Once | ORAL | Status: AC
Start: 2023-09-29 — End: 2023-09-29
  Administered 2023-09-29: 4 mg via ORAL
  Filled 2023-09-29: qty 1

## 2023-09-29 MED ORDER — BUSPIRONE HCL 10 MG PO TABS
15.0000 mg | ORAL_TABLET | Freq: Two times a day (BID) | ORAL | Status: DC
  Administered 2023-09-29 – 2023-10-01 (×5): 15 mg via ORAL
  Filled 2023-09-29 (×5): qty 2

## 2023-09-29 NOTE — ED Provider Notes (Signed)
Emergency Medicine Observation Re-evaluation Note  Karen [name] is a 48 y.o. female, seen on rounds today.  Pt initially presented to the ED for complaints of Suicidal Currently, the patient is resting comfortably.  Physical Exam  BP 108/73 (BP Location: Left Arm)   Pulse 86   Temp 98.8 F (37.1 C) (Oral)   Resp 17   LMP 08/23/2023 (Approximate)   SpO2 94%  Physical Exam Vitals and nursing note reviewed.  Constitutional:      General: She is not in acute distress.    Appearance: She is well-developed.  HENT:     Head: Normocephalic and atraumatic.  Eyes:     Conjunctiva/sclera: Conjunctivae normal.  Pulmonary:     Effort: Pulmonary effort is normal. No respiratory distress.  Musculoskeletal:        General: No swelling.     Cervical back: Neck supple.  Skin:    General: Skin is warm and dry.     Capillary Refill: Capillary refill takes less than 2 seconds.  Neurological:     Mental Status: She is alert.  Psychiatric:        Mood and Affect: Mood normal.      ED Course / MDM  EKG:   I have reviewed the labs performed to date as well as medications administered while in observation.  Recent changes in the last 24 hours include transferred from [facility] for monitoring prior to psychiatric admission.  Plan  Current plan is for inpatient admission.    Glendora Score, MD 09/29/23 [phone]

## 2023-09-29 NOTE — ED Provider Notes (Signed)
[facility] EMERGENCY DEPARTMENT AT [facility] Provider Note   CSN: [id number] Arrival date & time: 09/28/23  2347     History  Chief Complaint  Patient presents with   Suicidal    Karen [name] is a 48 y.o. female, hx of Ehlers-Danlos syndrome, who presents to the ED secondary to SI, and HI, for the last couple days.  She states she has a plan to kill herself, she reports that she plans on taking a knife, and cutting her wrist, or taking a bunch of pills.  She states she is just very unhappy.  Also states that she wants to kill her mother, and pets.  Went to [facility] was sent here for placement.  Denies any previous inpatient hospitalizations for psychiatric complaints.  Has been compliant with her medication.  Also reports that she ate a chocolate chip cookie yesterday, and is allergic to chocolate.  Reports itching today.  No shortness of breath however swelling of the face or the mouth  Home Medications Prior to Admission medications   Medication Sig Start Date End Date Taking? Authorizing Provider  albuterol (PROVENTIL) (2.5 MG/3ML) 0.083% nebulizer solution Take 3 mLs (2.5 mg total) by nebulization every 6 (six) hours as needed for wheezing or shortness of breath. 09/12/23   Ostwalt, Edmon Crape, PA-C  albuterol (VENTOLIN HFA) 108 (90 Base) MCG/ACT inhaler Inhale 2 puffs into the lungs every 6 (six) hours as needed. 02/27/23   Malva Limes, MD  Armodafinil 150 MG tablet TAKE 1 TABLET BY MOUTH EVERY DAY 03/31/23   Malva Limes, MD  busPIRone (BUSPAR) 10 MG tablet TAKE 1 TABLET (10 MG TOTAL) BY MOUTH 2 (TWO) TIMES DAILY. TAKE ALONG WITH 15MG  TABLET TWICE A DAY 02/27/23   Malva Limes, MD  busPIRone (BUSPAR) 15 MG tablet Take 1 tablet (15 mg total) by mouth 2 (two) times daily. 02/27/23   Malva Limes, MD  celecoxib (CELEBREX) 200 MG capsule Take 1 capsule (200 mg total) by mouth 2 (two) times daily. 06/02/23   Malva Limes, MD  Ciclopirox 8 % KIT Apply 1  application  topically daily. 05/17/22   Ostwalt, Edmon Crape, PA-C  diclofenac Sodium (VOLTAREN) 1 % GEL APPLY AS NEEDED 3 TIMES A DAY 02/27/23   Malva Limes, MD  DULoxetine (CYMBALTA) 60 MG capsule TAKE 2 CAPSULES BY MOUTH DAILY 09/16/23   Malva Limes, MD  fentaNYL (DURAGESIC) 100 MCG/HR Place 1 patch onto the skin every 3 (three) days. 08/30/23   Malva Limes, MD  fexofenadine (ALLEGRA) 180 MG tablet Take 180 mg by mouth daily.    [provider]  fluticasone-salmeterol (ADVAIR DISKUS) 250-50 MCG/ACT AEPB Inhale 1 puff into the lungs in the morning and at bedtime. 09/12/23   Ostwalt, Edmon Crape, PA-C  gabapentin (NEURONTIN) 800 MG tablet TAKE 1 TABLET BY MOUTH THREE TIMES A DAY 09/22/23   Malva Limes, MD  haloperidol (HALDOL) 0.5 MG tablet TAKE 1/2 OF A TABLET (0.25 MG TOTAL) BY MOUTH TWICE A DAY 09/23/22   Elveria Rising, NP  lurasidone (LATUDA) 80 MG TABS tablet Take 1 tablet (80 mg total) by mouth daily with supper. Patient taking differently: Take 100 mg by mouth daily with supper. 06/27/22   Clapacs, Jackquline Denmark, MD  methocarbamol (ROBAXIN) 500 MG tablet Take 1 tablet (500 mg total) by mouth every 8 (eight) hours as needed. 09/06/23   Janith Lima, MD  mometasone (ELOCON) 0.1 % cream Apply to rash BID up to  two weeks. 12/15/22   Deirdre Evener, MD  naloxone [facility]) nasal spray 4 mg/0.1 mL Place 1 spray into the nose as needed. 04/15/21   [provider]  omeprazole (PRILOSEC) 40 MG capsule TAKE 1 CAPSULE BY MOUTH TWICE A DAY 08/14/23   Malva Limes, MD  ondansetron (ZOFRAN-ODT) 4 MG disintegrating tablet DISSOLVE 1 TABLET IN MOUTH EVERY 8 HOURS FOR NAUSEA AND VOMITING 07/10/23   Malva Limes, MD  predniSONE (DELTASONE) 20 MG tablet Take 1 tablet (20 mg total) by mouth daily with breakfast. 09/12/23   Ostwalt, Edmon Crape, PA-C  QUEtiapine (SEROQUEL) 100 MG tablet Take 1 tablet (100 mg total) by mouth at bedtime. 08/30/23   Malva Limes, MD      Allergies    Augmentin   [amoxicillin-pot clavulanate], Chocolate flavoring agent (non-screening), Demerol [meperidine], Keflex [cephalexin], Morphine and codeine, Tape, Toradol [ketorolac tromethamine], Amoxicillin, Chocolate, Nsaids, and Strawberry extract    Review of Systems   Review of Systems  Respiratory:  Negative for shortness of breath.   Psychiatric/Behavioral:  Positive for suicidal ideas.     Physical Exam Updated Vital Signs BP 123/87 (BP Location: Right Arm)   Pulse 71   Temp 98.4 F (36.9 C) (Oral)   Resp 17   LMP 08/23/2023 (Approximate)   SpO2 99%  Physical Exam Vitals and nursing note reviewed.  Constitutional:      General: She is not in acute distress.    Appearance: She is well-developed.  HENT:     Head: Normocephalic and atraumatic.     Mouth/Throat:     Mouth: Mucous membranes are moist.     Pharynx: Oropharynx is clear. Uvula midline. No uvula swelling.     Comments: No facial, or oropharyngeal swelling.  Uvula midline Eyes:     Conjunctiva/sclera: Conjunctivae normal.  Cardiovascular:     Rate and Rhythm: Normal rate and regular rhythm.     Heart sounds: No murmur heard. Pulmonary:     Effort: Pulmonary effort is normal. No respiratory distress.     Breath sounds: Normal breath sounds.  Abdominal:     Palpations: Abdomen is soft.     Tenderness: There is no abdominal tenderness.  Musculoskeletal:        General: No swelling.     Cervical back: Neck supple.  Skin:    General: Skin is warm and dry.     Capillary Refill: Capillary refill takes less than 2 seconds.     Comments: No urticaria  Neurological:     Mental Status: She is alert.  Psychiatric:        Mood and Affect: Mood normal.     ED Results / Procedures / Treatments   Labs (all labs ordered are listed, but only abnormal results are displayed) Labs Reviewed  COMPREHENSIVE METABOLIC PANEL - Abnormal; Notable for the following components:      Result Value   Sodium 133 (*)    Glucose, Bld 102 (*)     Calcium 8.3 (*)    Total Protein 6.4 (*)    All other components within normal limits  CBC WITH DIFFERENTIAL/PLATELET - Abnormal; Notable for the following components:   Hemoglobin 11.8 (*)    All other components within normal limits  URINALYSIS, ROUTINE W REFLEX MICROSCOPIC - Abnormal; Notable for the following components:   Color, Urine STRAW (*)    Specific Gravity, Urine 1.004 (*)    All other components within normal limits  ACETAMINOPHEN LEVEL - Abnormal; Notable  for the following components:   Acetaminophen (Tylenol), Serum <10 (*)    All other components within normal limits  SALICYLATE LEVEL - Abnormal; Notable for the following components:   Salicylate Lvl <7.0 (*)    All other components within normal limits  ETHANOL  RAPID URINE DRUG SCREEN, HOSP PERFORMED  HCG, SERUM, QUALITATIVE    EKG None  Radiology No results found.  Procedures Procedures    Medications Ordered in ED Medications  diphenhydrAMINE (BENADRYL) capsule 25 mg (25 mg Oral Given 09/29/23 0102)    ED Course/ Medical Decision Making/ A&P                                 Medical Decision Making Patient is a 48 year old female, here for suicidal and homicidal ideations.  She has plans to kill herself with pills, or using a knife.  Denies any pill usage tonight.  She is overall well-appearing, except for states she is atopic it took yesterday, and feels itchy, has a history of allergies to chocolate.  We will give her some Benadryl for this, she has no oropharyngeal swelling or urticaria which is reassuring.  We will obtain labs for further evaluation.  She is voluntary at this time, however given suicidal and homicidal ideation, if she decides that she would like to leave, she will be IVC'd  Amount and/or Complexity of Data Reviewed Labs: ordered.    Details: Blood work and urinalysis unremarkable Discussion of management or test interpretation with external provider(s): Patient is medically  cleared, will need psychiatric eval given SI, HI.  Awaiting psychiatric eval    Final Clinical Impression(s) / ED Diagnoses Final diagnoses:  Suicidal ideation  Homicidal ideation    Rx / DC Orders ED Discharge Orders     None         Ormand Senn, Harley [place], PA 09/29/23 0419    [name], MD 09/29/23 [phone]

## 2023-09-29 NOTE — Telephone Encounter (Signed)
Requested medication (s) are due for refill today: yes  Requested medication (s) are on the active medication list: yes  Last refill:  Fentanyl: 08/30/23 10 patches      armodafinil: 03/31/23 #30 2 RF  Future visit scheduled: no  Notes to clinic:  meds not delegated to NT    Requested Prescriptions  Pending Prescriptions Disp Refills   fentaNYL (DURAGESIC) 100 MCG/HR 10 patch 0    Sig: Place 1 patch onto the skin every 3 (three) days.     Not Delegated - Analgesics:  Opioid Agonists Failed - 09/29/2023 12:02 PM      Failed - This refill cannot be delegated      Passed - Urine Drug Screen completed in last 360 days      Passed - Valid encounter within last 3 months    Recent Outpatient Visits           2 weeks ago Exacerbation of persistent asthma, unspecified asthma severity   [place] [facility] [place], [place], PA-C   4 months ago COVID-19 ruled out by laboratory testing   [facility] Jacky Kindle, FNP   4 months ago Sore throat   [facility] [place], [place], PA-C   5 months ago Thyroid nodule   [facility] Malva Limes, MD   7 months ago Acute cystitis with hematuria   [facility] [place], [place], PA-C       Future Appointments             In 2 weeks Wyline Mood, MD [facility] at [facility]             Armodafinil 150 MG tablet 30 tablet 3    Sig: Take 1 tablet (150 mg total) by mouth daily.     Not Delegated - Psychiatry:  Stimulants/ADHD Failed - 09/29/2023 12:02 PM      Failed - This refill cannot be delegated      Passed - Urine Drug Screen completed in last 360 days      Passed - Last BP in normal range    BP Readings from Last 1 Encounters:  09/29/23 108/73         Passed - Last Heart Rate in normal range    Pulse Readings from Last 1 Encounters:  09/29/23 86         Passed - Valid  encounter within last 6 months    Recent Outpatient Visits           2 weeks ago Exacerbation of persistent asthma, unspecified asthma severity   [facility] [place], [place], PA-C   4 months ago COVID-19 ruled out by laboratory testing   [facility] Jacky Kindle, FNP   4 months ago Sore throat   [facility] [place], [place], PA-C   5 months ago Thyroid nodule   [facility] Malva Limes, MD   7 months ago Acute cystitis with hematuria    [facility] Debera Lat, PA-C       Future Appointments             In 2 weeks Wyline Mood, MD [facility] at [facility]
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
## 2023-09-29 NOTE — Progress Notes (Signed)
Pt states she wore bipap about a year ago not regularly wearing it. Pt is in the hall and will wait til she gets a room to place on bipap if needed. No resp distress.

## 2023-09-30 ENCOUNTER — Other Ambulatory Visit: Payer: Self-pay | Admitting: Family Medicine

## 2023-09-30 DIAGNOSIS — F333 Major depressive disorder, recurrent, severe with psychotic symptoms: Secondary | ICD-10-CM | POA: Diagnosis not present

## 2023-09-30 DIAGNOSIS — G4710 Hypersomnia, unspecified: Secondary | ICD-10-CM

## 2023-09-30 MED ORDER — HALOPERIDOL 1 MG PO TABS
0.5000 mg | ORAL_TABLET | Freq: Every day | ORAL | Status: DC
  Administered 2023-09-30 – 2023-10-01 (×2): 0.5 mg via ORAL
  Filled 2023-09-30 (×2): qty 1

## 2023-09-30 MED ORDER — ACETAMINOPHEN 325 MG PO TABS
650.0000 mg | ORAL_TABLET | Freq: Once | ORAL | Status: AC
  Administered 2023-09-30: 650 mg via ORAL
  Filled 2023-09-30: qty 2

## 2023-09-30 MED ORDER — HALOPERIDOL 1 MG PO TABS
1.0000 mg | ORAL_TABLET | Freq: Every day | ORAL | Status: DC
  Administered 2023-09-30 – 2023-10-01 (×2): 1 mg via ORAL
  Filled 2023-09-30 (×2): qty 1

## 2023-09-30 NOTE — ED Provider Notes (Signed)
Emergency Medicine Observation Re-evaluation Note  Karen [name] is a 48 y.o. female, seen on rounds today.  Pt initially presented to the ED for complaints of Suicidal Currently, the patient is sleeping.  Physical Exam  BP 113/68 (BP Location: Left Arm)   Pulse 78   Temp 98.2 F (36.8 C) (Oral)   Resp 16   LMP 08/23/2023 (Approximate)   SpO2 92%  Physical Exam General: Sleeping Cardiac: Extremities well-perfused Lungs: Breathing is unlabored Psych: Deferred  ED Course / MDM  EKG:   I have reviewed the labs performed to date as well as medications administered while in observation.  Recent changes in the last 24 hours include none.  Plan  Current plan is for inpatient psychiatric admission.    Gloris Manchester, MD 09/30/23 [phone]

## 2023-09-30 NOTE — Consult Note (Signed)
[facility] Psychiatric Consult Follow-up  Patient Name: .LENNI RECKNER  MRN: [id number]  DOB: 04-Dec-1975  Consult Order details:  Orders (From admission, onward)     Start     Ordered   09/29/23 1928  CONSULT TO CALL ACT TEAM       Ordering Provider: Benjiman Core, MD  Provider:  (Not yet assigned)  Question:  Reason for Consult?  Answer:  placement   09/29/23 1927             Mode of Visit: In person    Psychiatry Consult Evaluation  Service Date: September 30, 2023 LOS:  LOS: 0 days  Chief Complaint hallucinations   Primary Psychiatric Diagnoses  MDD 2.   Suicidal Ideations  3.   Hallucinations   Assessment  NATORIA ARCHIBALD is a 48 y.o. female admitted: Presented to the ED on 09/28/2023 11:52 PM for hallucinations. She carries the psychiatric diagnoses of major depressive disorder with psychosis and and has a past medical history of traumatic brain injury and seizures.   Her current presentation of command hallucinations is most consistent with psychosis. She meets criteria for inpatient psychiatric admission based on command hallucinations to harm her mother whom she resides with.  Current outpatient psychotropic medications include BuSpar, Haldol, Cymbalta, and Seroquel and historically she has had a positive response to these medications. She was compliant with medications prior to admission as evidenced by patient and mother. On initial examination, patient tearful and distressed. Please see plan below for detailed recommendations.   Diagnoses:  Active Hospital problems: Principal Problem:   Depression, major, recurrent, severe with psychosis (HCC) Active Problems:   Psychosis (HCC)    Plan   ## Psychiatric Medication Recommendations:  Continue patient's home medications Start patient on Risperdal 1 mg daily and 0.5 mg PO afternoon and bedtime for psychosis.  ## Medical Decision Making Capacity: Not specifically addressed in this encounter  ##  Further Work-up:  -- No further workup needed at this time EKG -- Will obtain a more recent EKG, last EKG December 09, 2022 Qtc 436 -- Pertinent labwork reviewed earlier this admission includes: CMP, CBC, UDS, EKG   ## Disposition:-- We recommend inpatient psychiatric hospitalization. Patient is under voluntary admission status at this time; please IVC if attempts to leave hospital.  ## Behavioral / Environmental: -To minimize splitting of staff, assign one staff person to communicate all information from the team when feasible. or Utilize compassion and acknowledge the patient's experiences while setting clear and realistic expectations for care.    ## Safety and Observation Level:  - Based on my clinical evaluation, I estimate the patient to be at low risk of self harm in the current setting. - At this time, we recommend  routine. This decision is based on my review of the chart including patient's history and current presentation, interview of the patient, mental status examination, and consideration of suicide risk including evaluating suicidal ideation, plan, intent, suicidal or self-harm behaviors, risk factors, and protective factors. This judgment is based on our ability to directly address suicide risk, implement suicide prevention strategies, and develop a safety plan while the patient is in the clinical setting. Please contact our team if there is a concern that risk level has changed.  CSSR Risk Category:C-SSRS RISK CATEGORY: Low Risk  Suicide Risk Assessment: Patient has following modifiable risk factors for suicide: under treated depression  and CAH with suicidal content, which we are addressing by recommended inpatient psychiatric admission. Patient has following non-modifiable  or demographic risk factors for suicide: history of suicide attempt and psychiatric hospitalization Patient has the following protective factors against suicide: Access to outpatient mental health care,  Supportive family, and Pets in the home  Thank you for this consult request. Recommendations have been communicated to the primary team.  We will recommend inpatient psychiatric admission and continue to follow patient at this time.   Alona Bene, PMHNP       History of Present Illness  Relevant Aspects of Hospital ED Course:  Admitted on 09/28/2023 for hallucinations.  Patient Report:  HANIFAH ROYSE, 48 y.o., female patient seen face to face by this provider, consulted with Dr. Enedina Finner; and chart reviewed on 09/30/23.  On evaluation PADEN KURAS reports that she has hearing voices telling her to kill herself and to hurt her mother, patient is very tearful stating "my mom is my best friend, I would never hurt her."She states that she does not want to be admitted to an inpatient psychiatric facility, due to receiving ECT therapy at [facility], stating that she has been on that weight loss for 4 months as he cannot miss any appointment, but also continuing to voice suicidal ideations due to hallucinations.  She states the voices that she hears are new and they are commanding.  Patient continues to endorse SI/AH, denies HI/VH.  She is asking to be moved to a room where there is a TV, she states it does help her voices, to help drown them out, patient is currently lying on a gurney bed in the hallway.   During evaluation ALAHNA DUNNE is laying on a gurney bed in the hallway, and appears to be in moderate distress, as she has stated she is currently hearing voices to harm herself. She is alert, oriented x 4, tearful and anxious, cooperative and attentive. Her mood is depressed with congruent affect. She has normal speech, and behavior.  Objectively there is no evidence of psychosis/mania or delusional thinking.  Patient is able to converse coherently, goal directed thoughts, no distractibility, or pre-occupation.  Patient continues to endorse SI/AH, denies HI/VH.  Spoke with patient about   increasing her Haldol to help with the hallucinations, patient states that she will do what she has to do in order for the hallucinations to go away, states she does not like being in the hospital.  Per chart review: Pt has a hx of TBI. She has had two separate TBIs 3 years apart when she was in high school (1992 & 1995). Per her chart she has had a seizure d/o. She has been diagnosed with Acute Metabolic Encephaly. Per mother pt lost a lot of weight since August '22 due to not eating due to her delusional thinking. Pt has been hospitalized at [facility] several times since August '22 for thes same symptoms. Patient gets ECT once weekly from [facility] last treatment was yesterday (02/12). Pt is seen by a PA Kerin Salen who is a Paramedic and another named Gaye Pollack with [facility].   Psych ROS:  Depression: Positive Anxiety: Positive Mania (lifetime and current): Denies Psychosis: (lifetime and current): Positive  Collateral information:  Contacted none  Review of Systems  Psychiatric/Behavioral:  Positive for depression, hallucinations and suicidal ideas.      Psychiatric and Social History  Psychiatric History:  Information collected from patient  Prev Dx/Sx: Major depressive disorder with psychosis, TBI Current Psych Provider: [facility] Home Meds (current): See above Previous Med Trials: Yes Therapy: [facility]  Prior Psych Hospitalization: Yes Prior Self Harm: Yes Prior Violence: Denies  Family Psych History: Denies Family Hx suicide: Denies  Social History:  Developmental Hx: Unknown Educational Hx: Patient states he graduated high school Occupational Hx: Unemployed Armed forces operational officer Hx: Denies Living Situation: Lives with her mother Spiritual Hx: Catholic Access to weapons/lethal means: Denies  Substance History Alcohol: Denies Type of alcohol N/A Last Drink unknown Number of drinks per day none History of alcohol withdrawal seizures denies History of  DT's denies Tobacco: Denies Illicit drugs: denies Prescription drug abuse: denies Rehab hx: denies  Exam Findings  Physical Exam:  Vital Signs:  Temp:  [98.2 F (36.8 C)-98.6 F (37 C)] 98.6 F (37 C) (02/15 1704) Pulse Rate:  [75-87] 87 (02/15 1704) Resp:  [16-20] 20 (02/15 1704) BP: (113-141)/(68-98) 141/98 (02/15 1704) SpO2:  [92 %-99 %] 99 % (02/15 1704) Blood pressure (!) 141/98, pulse 87, temperature 98.6 F (37 C), temperature source Oral, resp. rate 20, last menstrual period 08/23/2023, SpO2 99%. There is no height or weight on file to calculate BMI.  Physical Exam Vitals and nursing note reviewed. Exam conducted with a chaperone present.  Neurological:     Mental Status: She is alert.  Psychiatric:        Attention and Perception: Attention normal.        Mood and Affect: Mood is anxious and depressed. Affect is flat and tearful.        Speech: Speech normal.        Behavior: Behavior is cooperative.        Thought Content: Thought content is delusional.        Judgment: Judgment is inappropriate.     Mental Status Exam: General Appearance: Disheveled  Orientation:  Full (Time, Place, and Person)  Memory:  Immediate;   Fair Remote;   Fair  Concentration:  Concentration: Fair and Attention Span: Fair  Recall:  Fair  Attention  Fair  Eye Contact:  Good  Speech:  Clear and Coherent  Language:  Good  Volume:  Normal  Mood: Sad, tearful  Affect:  Congruent  Thought Process:  Coherent  Thought Content:  Hallucinations: Auditory  Suicidal Thoughts:  Yes.  without intent/plan  Homicidal Thoughts:  No  Judgement:  Impaired  Insight:  Lacking  Psychomotor Activity:  Normal  Akathisia:  No  Fund of Knowledge:  Fair      Assets:  Manufacturing systems engineer Desire for Improvement Financial Resources/Insurance Housing Social Support  Cognition:  Impaired,  Moderate  ADL's:  Intact  AIMS (if indicated):        Other History   These have been pulled in through  the EMR, reviewed, and updated if appropriate.  Family History:  The patient's family history includes Asperger's syndrome in her brother, cousin, maternal aunt, maternal grandfather, and another family member; Asthma in her brother, maternal grandfather, and paternal grandfather; Breast cancer in her maternal aunt; Cancer in her cousin and maternal aunt; Cervical cancer in her maternal grandmother; Colon cancer in her maternal grandfather; Depression in her brother and mother; Ehlers-Danlos syndrome in her brother and father; Emphysema in her maternal grandfather; Fibromyalgia in her mother; Heart Problems in her paternal grandmother; Heart attack in her paternal grandfather; Heart disease in her paternal aunt and paternal uncle; Migraines in her brother; Osteoarthritis in her maternal grandmother and mother; Osteoporosis in her maternal grandmother; Other in her brother; Prostate cancer in her maternal grandfather; Stroke in her mother; Tuberculosis in her paternal grandfather.  Medical History:  Past Medical History:  Diagnosis Date   Ehlers-Danlos syndrome    Fibromyalgia    Headache    Polyp of descending colon    Sleep apnea    Tics of organic origin    Transient alteration of awareness     Surgical History: Past Surgical History:  Procedure Laterality Date   COLONOSCOPY WITH PROPOFOL N/A 02/13/2018   Procedure: COLONOSCOPY WITH PROPOFOL;  Surgeon: Midge Minium, MD;  Location: [facility] ENDOSCOPY;  Service: Endoscopy;  Laterality: N/A;   COLONOSCOPY WITH PROPOFOL N/A 01/11/2022   Procedure: COLONOSCOPY WITH PROPOFOL;  Surgeon: Midge Minium, MD;  Location: [facility] ENDOSCOPY;  Service: Endoscopy;  Laterality: N/A;   DILATION AND CURETTAGE OF UTERUS  08/16/2007   ESOPHAGOGASTRODUODENOSCOPY (EGD) WITH PROPOFOL  02/13/2018   Procedure: ESOPHAGOGASTRODUODENOSCOPY (EGD) WITH PROPOFOL;  Surgeon: Midge Minium, MD;  Location: [facility] ENDOSCOPY;  Service: Endoscopy;;   EYE SURGERY Left 08/15/1988   3  Surgeries on left eye to correct crossed eye   LAPAROSCOPY Left 08/15/2001   Fallopian Tube   MANDIBLE SURGERY  08/15/1996   OTHER SURGICAL HISTORY Right 08/15/1985   3 Surgeries to repair broken arm   OTHER SURGICAL HISTORY Left    3 surgeries on her left thigh as an infant   OTHER SURGICAL HISTORY  08/15/1996   Jaw surgery   Right arm surgery  08/15/1985   x 3 due to fracture   TOENAIL EXCISION Bilateral 06/13/2019   Procedure: BILATERAL SECOND TOE PARTIAL NAIL ABLATION;  Surgeon: Terance Hart, MD;  Location: [facility];  Service: Orthopedics;  Laterality: Bilateral;  SURGERY REQUEST TIME 1 HOUR   TONSILLECTOMY  12/13/2002   TONSILLECTOMY       Medications:   Current Facility-Administered Medications:    albuterol (PROVENTIL) (2.5 MG/3ML) 0.083% nebulizer solution 2.5 mg, 2.5 mg, Nebulization, Q6H PRN, Kommor, Madison, MD   busPIRone (BUSPAR) tablet 15 mg, 15 mg, Oral, BID, Kommor, Madison, MD, 15 mg at 09/30/23 0981   celecoxib (CELEBREX) capsule 200 mg, 200 mg, Oral, BID, Benjiman Core, MD, 200 mg at 09/30/23 1914   DULoxetine (CYMBALTA) DR capsule 120 mg, 120 mg, Oral, Daily, Kommor, Madison, MD, 120 mg at 09/30/23 0911   fentaNYL (DURAGESIC) 100 MCG/HR 1 patch, 1 patch, Transdermal, Q72H, Kommor, Madison, MD, 1 patch at 09/30/23 1058   gabapentin (NEURONTIN) capsule 800 mg, 800 mg, Oral, TID, Kommor, Madison, MD, 800 mg at 09/30/23 1317   haloperidol (HALDOL) tablet 0.5 mg, 0.5 mg, Oral, Q1400, Motley-Mangrum, Riona Lahti A, PMHNP, 0.5 mg at 09/30/23 1610   haloperidol (HALDOL) tablet 0.5 mg, 0.5 mg, Oral, QHS, Motley-Mangrum, Saramarie Stinger A, PMHNP   haloperidol (HALDOL) tablet 1 mg, 1 mg, Oral, Q1400, Motley-Mangrum, Jru Pense A, PMHNP, 1 mg at 09/30/23 1610   loratadine (CLARITIN) tablet 10 mg, 10 mg, Oral, Daily, Kommor, Madison, MD, 10 mg at 09/30/23 0913   lurasidone (LATUDA) tablet 80 mg, 80 mg, Oral, Q supper, Benjiman Core, MD, 80 mg at 09/30/23 1611    modafinil (PROVIGIL) tablet 200 mg, 200 mg, Oral, Daily, Kommor, Madison, MD, 200 mg at 09/30/23 0914   pantoprazole (PROTONIX) EC tablet 40 mg, 40 mg, Oral, Daily, Kommor, Madison, MD, 40 mg at 09/30/23 0912   QUEtiapine (SEROQUEL) tablet 100 mg, 100 mg, Oral, QHS, Kommor, Madison, MD, 100 mg at 09/29/23 2150  Current Outpatient Medications:    acetaminophen (TYLENOL) 500 MG tablet, Take 1,000 mg by mouth every 6 (six) hours as needed for mild pain (pain score 1-3) or moderate  pain (pain score 4-6)., Disp: , Rfl:    albuterol (PROVENTIL) (2.5 MG/3ML) 0.083% nebulizer solution, Take 3 mLs (2.5 mg total) by nebulization every 6 (six) hours as needed for wheezing or shortness of breath. (Patient taking differently: Take 2.5 mg by nebulization daily as needed for wheezing or shortness of breath.), Disp: 150 mL, Rfl: 1   albuterol (VENTOLIN HFA) 108 (90 Base) MCG/ACT inhaler, Inhale 2 puffs into the lungs every 6 (six) hours as needed., Disp: 18 g, Rfl: 5   Armodafinil 150 MG tablet, TAKE 1 TABLET BY MOUTH EVERY DAY, Disp: 30 tablet, Rfl: 3   busPIRone (BUSPAR) 10 MG tablet, TAKE 1 TABLET (10 MG TOTAL) BY MOUTH 2 (TWO) TIMES DAILY. TAKE ALONG WITH 15MG  TABLET TWICE A DAY, Disp: 180 tablet, Rfl: 3   busPIRone (BUSPAR) 15 MG tablet, Take 1 tablet (15 mg total) by mouth 2 (two) times daily., Disp: 180 tablet, Rfl: 3   celecoxib (CELEBREX) 200 MG capsule, Take 1 capsule (200 mg total) by mouth 2 (two) times daily., Disp: 60 capsule, Rfl: 5   docusate sodium (COLACE) 100 MG capsule, Take 200 mg by mouth daily., Disp: , Rfl:    DULoxetine (CYMBALTA) 60 MG capsule, TAKE 2 CAPSULES BY MOUTH DAILY, Disp: 180 capsule, Rfl: 3   fentaNYL (DURAGESIC) 100 MCG/HR, Place 1 patch onto the skin every 3 (three) days., Disp: 10 patch, Rfl: 0   fexofenadine (ALLEGRA) 180 MG tablet, Take 180 mg by mouth daily., Disp: , Rfl:    fluticasone-salmeterol (ADVAIR DISKUS) 250-50 MCG/ACT AEPB, Inhale 1 puff into the lungs in the  morning and at bedtime., Disp: 60 each, Rfl: 1   gabapentin (NEURONTIN) 800 MG tablet, TAKE 1 TABLET BY MOUTH THREE TIMES A DAY, Disp: 270 tablet, Rfl: 0   haloperidol (HALDOL) 0.5 MG tablet, TAKE 1/2 OF A TABLET (0.25 MG TOTAL) BY MOUTH TWICE A DAY (Patient taking differently: Take 1 mg by mouth daily.), Disp: 30 tablet, Rfl: 5   Lurasidone HCl (LATUDA) 120 MG TABS, Take 120 mg by mouth daily., Disp: , Rfl:    Melatonin 10 MG TABS, Take 20 mg by mouth at bedtime., Disp: , Rfl:    Multiple Vitamins-Minerals (HAIR SKIN & NAILS PO), Take 2 tablets by mouth daily., Disp: , Rfl:    Multiple Vitamins-Minerals (MULTIVITAMIN WITH MINERALS) tablet, Take 2 tablets by mouth daily., Disp: , Rfl:    naloxone (NARCAN) nasal spray 4 mg/0.1 mL, Place 1 spray into the nose as needed., Disp: , Rfl:    omeprazole (PRILOSEC) 40 MG capsule, TAKE 1 CAPSULE BY MOUTH TWICE A DAY, Disp: 180 capsule, Rfl: 4   ondansetron (ZOFRAN-ODT) 4 MG disintegrating tablet, DISSOLVE 1 TABLET IN MOUTH EVERY 8 HOURS FOR NAUSEA AND VOMITING, Disp: 30 tablet, Rfl: 5   QUEtiapine (SEROQUEL) 100 MG tablet, Take 1 tablet (100 mg total) by mouth at bedtime., Disp: 90 tablet, Rfl: 3   diclofenac Sodium (VOLTAREN) 1 % GEL, APPLY AS NEEDED 3 TIMES A DAY (Patient not taking: Reported on 09/29/2023), Disp: 100 g, Rfl: 3   lurasidone (LATUDA) 80 MG TABS tablet, Take 1 tablet (80 mg total) by mouth daily with supper. (Patient not taking: Reported on 09/29/2023), Disp: 30 tablet, Rfl: 1   methocarbamol (ROBAXIN) 500 MG tablet, Take 1 tablet (500 mg total) by mouth every 8 (eight) hours as needed. (Patient not taking: Reported on 09/29/2023), Disp: 30 tablet, Rfl: 0   predniSONE (DELTASONE) 20 MG tablet, Take 1 tablet (20 mg total)  by mouth daily with breakfast. (Patient not taking: Reported on 09/29/2023), Disp: 10 tablet, Rfl: 0  Allergies: Allergies  Allergen Reactions   Augmentin  [Amoxicillin-Pot Clavulanate] Diarrhea   Chocolate Flavoring Agent  (Non-Screening)     Other reaction(s): Other (See Comments) Wheezing, acne   Demerol [Meperidine] Other (See Comments)    Slow to wake up when this drug is given.    Keflex [Cephalexin] Nausea And Vomiting   Morphine And Codeine Nausea And Vomiting   Tape Dermatitis    Must use paper tape only   Toradol [Ketorolac Tromethamine] Nausea And Vomiting   Amoxicillin     Other reaction(s): Unknown   Chocolate     GI distress   Nsaids     patient develops ulcers Other reaction(s): Other (See Comments) patient develops ulcers   Strawberry Extract     GI distress    Paxson Harrower MOTLEY-MANGRUM, PMHNP

## 2023-10-01 DIAGNOSIS — F333 Major depressive disorder, recurrent, severe with psychotic symptoms: Secondary | ICD-10-CM | POA: Diagnosis not present

## 2023-10-01 LAB — RESP PANEL BY RT-PCR (RSV, FLU A&B, COVID)  RVPGX2
Influenza A by PCR: NEGATIVE
Influenza B by PCR: NEGATIVE
Resp Syncytial Virus by PCR: NEGATIVE
SARS Coronavirus 2 by RT PCR: NEGATIVE

## 2023-10-01 MED ORDER — BUSPIRONE HCL 10 MG PO TABS
25.0000 mg | ORAL_TABLET | Freq: Two times a day (BID) | ORAL | Status: DC
  Administered 2023-10-01 – 2023-10-02 (×2): 25 mg via ORAL
  Filled 2023-10-01 (×2): qty 3

## 2023-10-01 MED ORDER — ONDANSETRON 4 MG PO TBDP: 4.0000 mg | ORAL_TABLET | Freq: Four times a day (QID) | ORAL | Status: DC | PRN

## 2023-10-01 MED ORDER — ACETAMINOPHEN 325 MG PO TABS: 650.0000 mg | ORAL_TABLET | ORAL | Status: DC | PRN

## 2023-10-01 MED ORDER — ACETAMINOPHEN 325 MG PO TABS
650.0000 mg | ORAL_TABLET | Freq: Once | ORAL | Status: AC
Start: 2023-10-01 — End: 2023-10-01
  Administered 2023-10-01: 650 mg via ORAL
  Filled 2023-10-01: qty 2

## 2023-10-01 MED ORDER — QUETIAPINE FUMARATE 100 MG PO TABS
200.0000 mg | ORAL_TABLET | Freq: Every day | ORAL | Status: DC
  Administered 2023-10-01: 200 mg via ORAL
  Filled 2023-10-01: qty 2

## 2023-10-01 NOTE — Progress Notes (Signed)
[facility] LCSW Progress Note   10/01/2023    11:43 AM  Karen [name]   [id number]   Type of Contact and Topic:  Psychiatric Bed Placement   Pt accepted [name] [place]     Patient meets inpatient criteria per Alona Bene, PMHNP  The attending provider will be Dr. Roselyn Reef   Call report to [phone]  [name], RN @ [facility] ED notified.     Pt scheduled  to arrive at [facility] TOMORROW (10/02/2023) AFTER 0900.    [name], MSW, LCSW-A  11:44 AM 10/01/2023

## 2023-10-01 NOTE — ED Notes (Signed)
Patient states that she was feeling a little short of breathing, gave her a breathing treatment

## 2023-10-01 NOTE — ED Notes (Signed)
Patient made phone call to her mother.

## 2023-10-01 NOTE — Progress Notes (Signed)
Patient has been denied by [facility] due to no appropriate beds available. Patient meets BH inpatient criteria per Alona Bene, PMHNP. Patient has been faxed out to the following facilities:   [facility] [address] [place] [address] [phone]  [facility] [address] [place] [address] [phone]  [facility] [address] [place] [address] [phone]  [facility] Patient Placement [facility], [place] [phone]  [facility] [address], [place] [address] [phone]  [facility] [address] [place] [address] [phone]  [facility] [address] [place] [address] [phone]  [facility] [address]o B[address] [place] [address] [phone]  [facility] [address] [place] [address] [phone]  [facility] EFAX [address], [place] [phone]  [facility] [address], [place] [address] [phone]  [facility] [address] [place] [address] [phone]  [facility] [address] [place] [address] [phone]  [facility] [address], [place] [address] [phone]  [facility] [address] [place], [place] [address] [phone]  [facility] [address] [place] [address] [phone]  [facility] [address] [place] [address] [phone]  [facility] [address], [place] [address] [phone]  [facility] [address] [place] [address] [phone]   [name], MSW, LCSW-A  11:24 AM 10/01/2023

## 2023-10-01 NOTE — ED Notes (Signed)
[facility] spoke to this nurse, checking on status of patient. Patient status was updated. Staff Sue Lush proceeded to let me know that patient was accepted to [facility].

## 2023-10-01 NOTE — ED Provider Notes (Signed)
Emergency Medicine Observation Re-evaluation Note  Karen [name] is a 48 y.o. female, seen on rounds today.  Pt initially presented to the ED for complaints of Suicidal Currently, the patient is sleeping.  Physical Exam  BP (!) 145/93   Pulse 74   Temp 98 F (36.7 C) (Oral)   Resp 18   LMP 08/23/2023 (Approximate)   SpO2 97%  Physical Exam General: Sleeping Cardiac: Extremities perfused Lungs: Unlabored breathing Psych: Deferred  ED Course / MDM  EKG:   I have reviewed the labs performed to date as well as medications administered while in observation.  Recent changes in the last 24 hours include none.  Plan  Current plan is for inpatient psychiatric admission.    Gloris Manchester, MD 10/01/23 1018

## 2023-10-01 NOTE — ED Notes (Signed)
Patient very emotional this morning, states, "feeling like she is being punish, being in the hallway and not having her personal items and not being able to watch tv." I explained to patient that we are still waiting for bed.

## 2023-10-01 NOTE — Progress Notes (Signed)
CSW sent additional BH documentation to [facility] for review.    [name], MSW, LCSW-A  11:25 AM 10/01/2023

## 2023-10-01 NOTE — ED Notes (Signed)
Patient was given morning meds. Patient got upset, stating that she is not getting the correct dose of her meds and is requesting to talk to pharmacy.

## 2023-10-02 DIAGNOSIS — F333 Major depressive disorder, recurrent, severe with psychotic symptoms: Secondary | ICD-10-CM | POA: Diagnosis not present

## 2023-10-02 DIAGNOSIS — K8020 Calculus of gallbladder without cholecystitis without obstruction: Secondary | ICD-10-CM | POA: Diagnosis not present

## 2023-10-02 DIAGNOSIS — Q796 Ehlers-Danlos syndrome, unspecified: Secondary | ICD-10-CM | POA: Diagnosis not present

## 2023-10-02 DIAGNOSIS — M797 Fibromyalgia: Secondary | ICD-10-CM | POA: Diagnosis not present

## 2023-10-02 DIAGNOSIS — K219 Gastro-esophageal reflux disease without esophagitis: Secondary | ICD-10-CM | POA: Diagnosis not present

## 2023-10-02 DIAGNOSIS — G9340 Encephalopathy, unspecified: Secondary | ICD-10-CM | POA: Diagnosis not present

## 2023-10-02 DIAGNOSIS — J9601 Acute respiratory failure with hypoxia: Secondary | ICD-10-CM | POA: Diagnosis not present

## 2023-10-02 DIAGNOSIS — N3289 Other specified disorders of bladder: Secondary | ICD-10-CM | POA: Diagnosis not present

## 2023-10-02 DIAGNOSIS — Z87820 Personal history of traumatic brain injury: Secondary | ICD-10-CM | POA: Diagnosis not present

## 2023-10-02 DIAGNOSIS — R7401 Elevation of levels of liver transaminase levels: Secondary | ICD-10-CM | POA: Diagnosis not present

## 2023-10-02 DIAGNOSIS — R0600 Dyspnea, unspecified: Secondary | ICD-10-CM | POA: Diagnosis not present

## 2023-10-02 DIAGNOSIS — G4489 Other headache syndrome: Secondary | ICD-10-CM | POA: Diagnosis not present

## 2023-10-02 DIAGNOSIS — T40602A Poisoning by unspecified narcotics, intentional self-harm, initial encounter: Secondary | ICD-10-CM | POA: Diagnosis not present

## 2023-10-02 DIAGNOSIS — R531 Weakness: Secondary | ICD-10-CM | POA: Diagnosis not present

## 2023-10-02 DIAGNOSIS — K828 Other specified diseases of gallbladder: Secondary | ICD-10-CM | POA: Diagnosis not present

## 2023-10-02 DIAGNOSIS — R0789 Other chest pain: Secondary | ICD-10-CM | POA: Diagnosis not present

## 2023-10-02 DIAGNOSIS — R4182 Altered mental status, unspecified: Secondary | ICD-10-CM | POA: Diagnosis not present

## 2023-10-02 DIAGNOSIS — R440 Auditory hallucinations: Secondary | ICD-10-CM | POA: Diagnosis not present

## 2023-10-02 DIAGNOSIS — K3189 Other diseases of stomach and duodenum: Secondary | ICD-10-CM | POA: Diagnosis not present

## 2023-10-02 DIAGNOSIS — F331 Major depressive disorder, recurrent, moderate: Secondary | ICD-10-CM | POA: Diagnosis not present

## 2023-10-02 DIAGNOSIS — R45851 Suicidal ideations: Secondary | ICD-10-CM | POA: Diagnosis not present

## 2023-10-02 DIAGNOSIS — R079 Chest pain, unspecified: Secondary | ICD-10-CM | POA: Diagnosis not present

## 2023-10-02 DIAGNOSIS — R609 Edema, unspecified: Secondary | ICD-10-CM | POA: Diagnosis not present

## 2023-10-02 DIAGNOSIS — F32A Depression, unspecified: Secondary | ICD-10-CM | POA: Diagnosis not present

## 2023-10-02 DIAGNOSIS — R443 Hallucinations, unspecified: Secondary | ICD-10-CM | POA: Diagnosis not present

## 2023-10-02 DIAGNOSIS — J690 Pneumonitis due to inhalation of food and vomit: Secondary | ICD-10-CM | POA: Diagnosis not present

## 2023-10-02 DIAGNOSIS — W19XXXA Unspecified fall, initial encounter: Secondary | ICD-10-CM | POA: Diagnosis not present

## 2023-10-02 DIAGNOSIS — J189 Pneumonia, unspecified organism: Secondary | ICD-10-CM | POA: Diagnosis not present

## 2023-10-02 DIAGNOSIS — R9431 Abnormal electrocardiogram [ECG] [EKG]: Secondary | ICD-10-CM | POA: Diagnosis not present

## 2023-10-02 DIAGNOSIS — R55 Syncope and collapse: Secondary | ICD-10-CM | POA: Diagnosis not present

## 2023-10-02 DIAGNOSIS — M792 Neuralgia and neuritis, unspecified: Secondary | ICD-10-CM | POA: Diagnosis not present

## 2023-10-02 DIAGNOSIS — R45850 Homicidal ideations: Secondary | ICD-10-CM | POA: Diagnosis not present

## 2023-10-02 DIAGNOSIS — R2243 Localized swelling, mass and lump, lower limb, bilateral: Secondary | ICD-10-CM | POA: Diagnosis not present

## 2023-10-02 DIAGNOSIS — K6289 Other specified diseases of anus and rectum: Secondary | ICD-10-CM | POA: Diagnosis not present

## 2023-10-02 DIAGNOSIS — R7989 Other specified abnormal findings of blood chemistry: Secondary | ICD-10-CM | POA: Diagnosis not present

## 2023-10-02 DIAGNOSIS — I341 Nonrheumatic mitral (valve) prolapse: Secondary | ICD-10-CM | POA: Diagnosis not present

## 2023-10-02 DIAGNOSIS — S0990XA Unspecified injury of head, initial encounter: Secondary | ICD-10-CM | POA: Diagnosis not present

## 2023-10-02 DIAGNOSIS — J45909 Unspecified asthma, uncomplicated: Secondary | ICD-10-CM | POA: Diagnosis not present

## 2023-10-02 DIAGNOSIS — N3000 Acute cystitis without hematuria: Secondary | ICD-10-CM | POA: Diagnosis not present

## 2023-10-02 DIAGNOSIS — F339 Major depressive disorder, recurrent, unspecified: Secondary | ICD-10-CM | POA: Diagnosis not present

## 2023-10-02 DIAGNOSIS — N390 Urinary tract infection, site not specified: Secondary | ICD-10-CM | POA: Diagnosis not present

## 2023-10-02 NOTE — ED Provider Notes (Signed)
Emergency Medicine Observation Re-evaluation Note  Karen [name] is a 48 y.o. female, seen on rounds today.  Pt initially presented to the ED for complaints of Suicidal Currently, the patient is inpt psych, accepted to [facility].   Physical Exam  BP 137/83 (BP Location: Left Arm)   Pulse 76   Temp 98.2 F (36.8 C) (Oral)   Resp 18   LMP 08/23/2023 (Approximate)   SpO2 96%  Physical Exam General: NAD Cardiac: RR Lungs: even unlabored Psych: RR  ED Course / MDM  EKG:   I have reviewed the labs performed to date as well as medications administered while in observation.  Recent changes in the last 24 hours include none.  Plan  Current plan is for inpatient placement for SI/HI.  Accepted to [facility].    Alvira Monday, MD 10/02/23 [phone]

## 2023-10-02 NOTE — Telephone Encounter (Signed)
Requested medication (s) are due for refill today - yes  Requested medication (s) are on the active medication list -yes  Future visit scheduled -no  Last refill: 03/31/23 #30 3RF  Notes to clinic: non delegated Rx  Requested Prescriptions  Pending Prescriptions Disp Refills   Armodafinil 150 MG tablet [Pharmacy Med Name: ARMODAFINIL 150 MG TABLET] 30 tablet     Sig: TAKE 1 TABLET BY MOUTH EVERY DAY     Not Delegated - Psychiatry:  Stimulants/ADHD Failed - 10/02/2023  9:34 AM      Failed - This refill cannot be delegated      Passed - Urine Drug Screen completed in last 360 days      Passed - Last BP in normal range    BP Readings from Last 1 Encounters:  10/02/23 137/83         Passed - Last Heart Rate in normal range    Pulse Readings from Last 1 Encounters:  10/02/23 76         Passed - Valid encounter within last 6 months    Recent Outpatient Visits           2 weeks ago Exacerbation of persistent asthma, unspecified asthma severity   [place] [facility] [place], [place], PA-C   4 months ago COVID-19 ruled out by laboratory testing   [facility] Jacky Kindle, FNP   4 months ago Sore throat   [place] [facility] [place], [place], PA-C   5 months ago Thyroid nodule   [place] [facility] Malva Limes, MD   7 months ago Acute cystitis with hematuria   [facility] [place], Edmon Crape, PA-C       Future Appointments             In 2 weeks Wyline Mood, MD [facility] at [facility]               Requested Prescriptions  Pending Prescriptions Disp Refills   Armodafinil 150 MG tablet [Pharmacy Med Name: ARMODAFINIL 150 MG TABLET] 30 tablet     Sig: TAKE 1 TABLET BY MOUTH EVERY DAY     Not Delegated - Psychiatry:  Stimulants/ADHD Failed - 10/02/2023  9:34 AM      Failed - This refill cannot be delegated      Passed - Urine  Drug Screen completed in last 360 days      Passed - Last BP in normal range    BP Readings from Last 1 Encounters:  10/02/23 137/83         Passed - Last Heart Rate in normal range    Pulse Readings from Last 1 Encounters:  10/02/23 76         Passed - Valid encounter within last 6 months    Recent Outpatient Visits           2 weeks ago Exacerbation of persistent asthma, unspecified asthma severity   [place] [facility] [place], [place], PA-C   4 months ago COVID-19 ruled out by laboratory testing   [facility] Jacky Kindle, FNP   4 months ago Sore throat   [place] [facility] [place], [place], PA-C   5 months ago Thyroid nodule   [facility] Malva Limes, MD   7 months ago Acute cystitis with hematuria   [facility] [place], [place],  PA-C       Future Appointments             In 2 weeks Wyline Mood, MD [facility] at [facility]

## 2023-10-02 NOTE — ED Notes (Signed)
 Patient sleeping at this time.

## 2023-10-03 ENCOUNTER — Telehealth: Payer: Self-pay

## 2023-10-03 MED ORDER — ARMODAFINIL 150 MG PO TABS
150.0000 mg | ORAL_TABLET | Freq: Every day | ORAL | 3 refills | Status: DC
Start: 2023-10-03 — End: 2024-02-05

## 2023-10-03 MED ORDER — FENTANYL 100 MCG/HR TD PT72: 1.0000 | MEDICATED_PATCH | TRANSDERMAL | 0 refills | Status: DC

## 2023-10-03 NOTE — Telephone Encounter (Signed)
Copied from CRM [id number]. Topic: General - Other >> Sep 28, 2023  1:22 PM Clide Dales wrote: Patient is requesting a callback from Dr. Sherrie Mustache.

## 2023-10-03 NOTE — Telephone Encounter (Signed)
Called to follow-up on patient. Na not able to leave message.

## 2023-10-05 ENCOUNTER — Other Ambulatory Visit: Payer: Self-pay | Admitting: Family Medicine

## 2023-10-05 NOTE — Telephone Encounter (Unsigned)
Copied from CRM [id number]. Topic: Clinical - Medication Refill >> Oct 05, 2023  2:59 PM Payton Doughty wrote: Most Recent Primary Care Visit:  Provider: Debera Lat  Department: [id number]  Visit Type: OFFICE VISIT  Date: 09/12/2023  Medication: fentaNYL (DURAGESIC) 100 MCG/HR  Has the patient contacted their pharmacy? No (Agent: If no, request that the patient contact the pharmacy for the refill. If patient does not wish to contact the pharmacy document the reason why and proceed with request.) (Agent: If yes, when and what did the pharmacy advise?)  Is this the correct pharmacy for this prescription? Yes If no, delete pharmacy and type the correct one.  This is the patient's preferred pharmacy:  [id number] [place], [place] - [address] [place] [address] Phone: [phone] Fax: [phone]   Has the prescription been filled recently? Yes  Is the patient out of the medication? Yes  Has the patient been seen for an appointment in the last year OR does the patient have an upcoming appointment? Yes  Can we respond through MyChart? Yes  Pt states she is in a treatment center now, and her mother will p/u the Rx.

## 2023-10-06 MED ORDER — FEXOFENADINE HCL 180 MG PO TABS: 180.0000 mg | ORAL_TABLET | Freq: Every day | ORAL | 1 refills | Status: AC

## 2023-10-06 NOTE — Telephone Encounter (Signed)
Requested medications are due for refill today.  Unsure  Requested medications are on the active medications list.  yes  Last refill. 01/07/2014  Future visit scheduled.   no  Notes to clinic.  Medication is historical.    Requested Prescriptions  Pending Prescriptions Disp Refills   fexofenadine (ALLEGRA) 180 MG tablet      Sig: Take 1 tablet (180 mg total) by mouth daily.     Ear, Nose, and Throat:  Antihistamines Passed - 10/06/2023 12:29 PM      Passed - Valid encounter within last 12 months    Recent Outpatient Visits           3 weeks ago Exacerbation of persistent asthma, unspecified asthma severity   [place] [facility] [place], [place], PA-C   4 months ago COVID-19 ruled out by laboratory testing   [facility] Jacky Kindle, FNP   5 months ago Sore throat   [place] [facility] [place], [place], PA-C   5 months ago Thyroid nodule   [facility] Malva Limes, MD   7 months ago Acute cystitis with hematuria   [facility] Debera Lat, PA-C       Future Appointments             In 1 week Wyline Mood, MD [facility] [place] [facility] at [facility]

## 2023-10-09 DIAGNOSIS — R7401 Elevation of levels of liver transaminase levels: Secondary | ICD-10-CM | POA: Diagnosis not present

## 2023-10-09 DIAGNOSIS — R0600 Dyspnea, unspecified: Secondary | ICD-10-CM | POA: Diagnosis not present

## 2023-10-09 DIAGNOSIS — R0789 Other chest pain: Secondary | ICD-10-CM | POA: Diagnosis not present

## 2023-10-09 DIAGNOSIS — J45909 Unspecified asthma, uncomplicated: Secondary | ICD-10-CM | POA: Diagnosis not present

## 2023-10-09 DIAGNOSIS — R2243 Localized swelling, mass and lump, lower limb, bilateral: Secondary | ICD-10-CM | POA: Diagnosis not present

## 2023-10-09 DIAGNOSIS — R531 Weakness: Secondary | ICD-10-CM | POA: Diagnosis not present

## 2023-10-17 ENCOUNTER — Ambulatory Visit: Payer: Medicare Other | Admitting: Gastroenterology

## 2023-10-17 NOTE — Progress Notes (Deleted)
 Wyline Mood MD, MRCP(U.K) [address]  [address]  [place], [place] [address]  Main: [phone]  Fax: [phone]   Primary Care Physician: Malva Limes, MD  Primary Gastroenterologist:  Dr. Wyline Mood   No chief complaint on file.   HPI: Karen [name] is a 48 y.o. female who was last seen at our office in October 2022 by Dr. Servando Snare for abdominal pain and colon polyps.  She has a history of Ehlers-Danlos syndrome, fibromyalgia.  Known to have adenomatous polyps.  She had been having heartburn not helped by omeprazole.  But when she increase the dose to twice a day it was better.  Underwent a colonoscopy in 2 diminutive polyps were found in the rectum and descending colon, internal hemorrhoids were noted.  Repeat colonoscopy in 5 years was recommended.  Since the polyps were tubular adenomas.  She scheduled a visit for epigastric pain with nausea.  On PPI.  No recent abdominal imaging. Seen in the ER on 10/09/2023 for chest pain.  Cardiology referral was placed to be seen as an outpatient.    Current Outpatient Medications  Medication Sig Dispense Refill   acetaminophen (TYLENOL) 500 MG tablet Take 1,000 mg by mouth every 6 (six) hours as needed for mild pain (pain score 1-3) or moderate pain (pain score 4-6).     albuterol (PROVENTIL) (2.5 MG/3ML) 0.083% nebulizer solution Take 3 mLs (2.5 mg total) by nebulization every 6 (six) hours as needed for wheezing or shortness of breath. (Patient taking differently: Take 2.5 mg by nebulization daily as needed for wheezing or shortness of breath.) 150 mL 1   albuterol (VENTOLIN HFA) 108 (90 Base) MCG/ACT inhaler Inhale 2 puffs into the lungs every 6 (six) hours as needed. 18 g 5   Armodafinil 150 MG tablet Take 1 tablet (150 mg total) by mouth daily. 30 tablet 3   busPIRone (BUSPAR) 10 MG tablet TAKE 1 TABLET (10 MG TOTAL) BY MOUTH 2 (TWO) TIMES DAILY. TAKE ALONG WITH 15MG  TABLET TWICE A DAY 180 tablet 3   busPIRone (BUSPAR) 15 MG  tablet Take 1 tablet (15 mg total) by mouth 2 (two) times daily. 180 tablet 3   celecoxib (CELEBREX) 200 MG capsule Take 1 capsule (200 mg total) by mouth 2 (two) times daily. 60 capsule 5   diclofenac Sodium (VOLTAREN) 1 % GEL APPLY AS NEEDED 3 TIMES A DAY (Patient not taking: Reported on 09/29/2023) 100 g 3   docusate sodium (COLACE) 100 MG capsule Take 200 mg by mouth daily.     DULoxetine (CYMBALTA) 60 MG capsule TAKE 2 CAPSULES BY MOUTH DAILY 180 capsule 3   fentaNYL (DURAGESIC) 100 MCG/HR Place 1 patch onto the skin every 3 (three) days. 10 patch 0   fexofenadine (ALLEGRA) 180 MG tablet Take 1 tablet (180 mg total) by mouth daily. 90 tablet 1   fluticasone-salmeterol (ADVAIR DISKUS) 250-50 MCG/ACT AEPB Inhale 1 puff into the lungs in the morning and at bedtime. 60 each 1   gabapentin (NEURONTIN) 800 MG tablet TAKE 1 TABLET BY MOUTH THREE TIMES A DAY 270 tablet 0   haloperidol (HALDOL) 0.5 MG tablet TAKE 1/2 OF A TABLET (0.25 MG TOTAL) BY MOUTH TWICE A DAY (Patient taking differently: Take 1 mg by mouth daily.) 30 tablet 5   lurasidone (LATUDA) 80 MG TABS tablet Take 1 tablet (80 mg total) by mouth daily with supper. (Patient not taking: Reported on 09/29/2023) 30 tablet 1   Lurasidone HCl (LATUDA) 120  MG TABS Take 120 mg by mouth daily.     Melatonin 10 MG TABS Take 20 mg by mouth at bedtime.     methocarbamol (ROBAXIN) 500 MG tablet Take 1 tablet (500 mg total) by mouth every 8 (eight) hours as needed. (Patient not taking: Reported on 09/29/2023) 30 tablet 0   Multiple Vitamins-Minerals (HAIR SKIN & NAILS PO) Take 2 tablets by mouth daily.     Multiple Vitamins-Minerals (MULTIVITAMIN WITH MINERALS) tablet Take 2 tablets by mouth daily.     naloxone (NARCAN) nasal spray 4 mg/0.1 mL Place 1 spray into the nose as needed.     omeprazole (PRILOSEC) 40 MG capsule TAKE 1 CAPSULE BY MOUTH TWICE A DAY 180 capsule 4   ondansetron (ZOFRAN-ODT) 4 MG disintegrating tablet DISSOLVE 1 TABLET IN MOUTH EVERY 8  HOURS FOR NAUSEA AND VOMITING 30 tablet 5   predniSONE (DELTASONE) 20 MG tablet Take 1 tablet (20 mg total) by mouth daily with breakfast. (Patient not taking: Reported on 09/29/2023) 10 tablet 0   QUEtiapine (SEROQUEL) 100 MG tablet Take 1 tablet (100 mg total) by mouth at bedtime. 90 tablet 3   No current facility-administered medications for this visit.    Allergies as of 10/17/2023 - Review Complete 09/29/2023  Allergen Reaction Noted   Augmentin  [amoxicillin-pot clavulanate] Diarrhea 03/10/2015   Chocolate flavoring agent (non-screening)  03/18/2014   Demerol [meperidine] Other (See Comments) 01/27/2014   Keflex [cephalexin] Nausea And Vomiting 01/27/2014   Morphine and codeine Nausea And Vomiting 01/27/2014   Tape Dermatitis 07/29/2015   Toradol [ketorolac tromethamine] Nausea And Vomiting 01/27/2014   Amoxicillin  04/01/2015   Chocolate  03/10/2015   Nsaids  03/10/2015   Strawberry extract  03/10/2015       Interval history   ***/***/202*   ***/***/2024   ROS:  General: Negative for anorexia, weight loss, fever, chills, fatigue, weakness. ENT: Negative for hoarseness, difficulty swallowing , nasal congestion. CV: Negative for chest pain, angina, palpitations, dyspnea on exertion, peripheral edema.  Respiratory: Negative for dyspnea at rest, dyspnea on exertion, cough, sputum, wheezing.  GI: See history of present illness. GU:  Negative for dysuria, hematuria, urinary incontinence, urinary frequency, nocturnal urination.  Endo: Negative for unusual weight change.    Physical Examination:   There were no vitals taken for this visit.  General: Well-nourished, well-developed in no acute distress.  Eyes: No icterus. Conjunctivae pink. Mouth: Oropharyngeal mucosa moist and pink , no lesions erythema or exudate. Lungs: Clear to auscultation bilaterally. Non-labored. Heart: Regular rate and rhythm, no murmurs rubs or gallops.  Abdomen: Bowel sounds are normal, nontender,  nondistended, no hepatosplenomegaly or masses, no abdominal bruits or hernia , no rebound or guarding.   Extremities: No lower extremity edema. No clubbing or deformities. Neuro: Alert and oriented x 3.  Grossly intact. Skin: Warm and dry, no jaundice.   Psych: Alert and cooperative, normal mood and affect.   Imaging Studies: No results found.  Assessment and Plan:   MIHIKA SURRETTE is a 48 y.o. y/o female here to see me for epigastric pain.  Recent ER visit for chest pain.  Plan 1.  Commence on Linzess samples provided 2.  H. pylori breath test 3.  Once cardiology workup has been completed if she still has the pain we can proceed with upper endoscopy   Dr Wyline Mood  MD,MRCP [facility]) Follow up in 2 to 3 months

## 2023-10-20 DIAGNOSIS — J690 Pneumonitis due to inhalation of food and vomit: Secondary | ICD-10-CM | POA: Diagnosis not present

## 2023-10-20 DIAGNOSIS — G9340 Encephalopathy, unspecified: Secondary | ICD-10-CM | POA: Diagnosis not present

## 2023-10-20 DIAGNOSIS — R9431 Abnormal electrocardiogram [ECG] [EKG]: Secondary | ICD-10-CM | POA: Diagnosis not present

## 2023-10-20 DIAGNOSIS — R7989 Other specified abnormal findings of blood chemistry: Secondary | ICD-10-CM | POA: Diagnosis not present

## 2023-10-20 DIAGNOSIS — N3000 Acute cystitis without hematuria: Secondary | ICD-10-CM | POA: Diagnosis not present

## 2023-10-20 DIAGNOSIS — K811 Chronic cholecystitis: Secondary | ICD-10-CM | POA: Diagnosis not present

## 2023-10-20 DIAGNOSIS — E871 Hypo-osmolality and hyponatremia: Secondary | ICD-10-CM | POA: Diagnosis not present

## 2023-10-20 DIAGNOSIS — J9589 Other postprocedural complications and disorders of respiratory system, not elsewhere classified: Secondary | ICD-10-CM | POA: Diagnosis not present

## 2023-10-20 DIAGNOSIS — K8010 Calculus of gallbladder with chronic cholecystitis without obstruction: Secondary | ICD-10-CM | POA: Diagnosis not present

## 2023-10-20 DIAGNOSIS — R638 Other symptoms and signs concerning food and fluid intake: Secondary | ICD-10-CM | POA: Diagnosis not present

## 2023-10-20 DIAGNOSIS — D649 Anemia, unspecified: Secondary | ICD-10-CM | POA: Diagnosis not present

## 2023-10-20 DIAGNOSIS — R339 Retention of urine, unspecified: Secondary | ICD-10-CM | POA: Diagnosis not present

## 2023-10-20 DIAGNOSIS — F333 Major depressive disorder, recurrent, severe with psychotic symptoms: Secondary | ICD-10-CM | POA: Diagnosis not present

## 2023-10-20 DIAGNOSIS — F331 Major depressive disorder, recurrent, moderate: Secondary | ICD-10-CM | POA: Diagnosis not present

## 2023-10-20 DIAGNOSIS — R609 Edema, unspecified: Secondary | ICD-10-CM | POA: Diagnosis not present

## 2023-10-20 DIAGNOSIS — R0989 Other specified symptoms and signs involving the circulatory and respiratory systems: Secondary | ICD-10-CM | POA: Diagnosis not present

## 2023-10-20 DIAGNOSIS — R55 Syncope and collapse: Secondary | ICD-10-CM | POA: Diagnosis not present

## 2023-10-20 DIAGNOSIS — M792 Neuralgia and neuritis, unspecified: Secondary | ICD-10-CM | POA: Diagnosis not present

## 2023-10-20 DIAGNOSIS — K5903 Drug induced constipation: Secondary | ICD-10-CM | POA: Diagnosis not present

## 2023-10-20 DIAGNOSIS — G928 Other toxic encephalopathy: Secondary | ICD-10-CM | POA: Diagnosis not present

## 2023-10-20 DIAGNOSIS — G894 Chronic pain syndrome: Secondary | ICD-10-CM | POA: Diagnosis not present

## 2023-10-20 DIAGNOSIS — K828 Other specified diseases of gallbladder: Secondary | ICD-10-CM | POA: Diagnosis not present

## 2023-10-20 DIAGNOSIS — W19XXXA Unspecified fall, initial encounter: Secondary | ICD-10-CM | POA: Diagnosis not present

## 2023-10-20 DIAGNOSIS — I959 Hypotension, unspecified: Secondary | ICD-10-CM | POA: Diagnosis not present

## 2023-10-20 DIAGNOSIS — T40411A Poisoning by fentanyl or fentanyl analogs, accidental (unintentional), initial encounter: Secondary | ICD-10-CM | POA: Diagnosis not present

## 2023-10-20 DIAGNOSIS — K8020 Calculus of gallbladder without cholecystitis without obstruction: Secondary | ICD-10-CM | POA: Diagnosis not present

## 2023-10-20 DIAGNOSIS — Q796 Ehlers-Danlos syndrome, unspecified: Secondary | ICD-10-CM | POA: Diagnosis not present

## 2023-10-20 DIAGNOSIS — R4182 Altered mental status, unspecified: Secondary | ICD-10-CM | POA: Diagnosis not present

## 2023-10-20 DIAGNOSIS — R531 Weakness: Secondary | ICD-10-CM | POA: Diagnosis not present

## 2023-10-20 DIAGNOSIS — K8064 Calculus of gallbladder and bile duct with chronic cholecystitis without obstruction: Secondary | ICD-10-CM | POA: Diagnosis not present

## 2023-10-20 DIAGNOSIS — J9811 Atelectasis: Secondary | ICD-10-CM | POA: Diagnosis not present

## 2023-10-20 DIAGNOSIS — R45851 Suicidal ideations: Secondary | ICD-10-CM | POA: Diagnosis not present

## 2023-10-20 DIAGNOSIS — S0990XA Unspecified injury of head, initial encounter: Secondary | ICD-10-CM | POA: Diagnosis not present

## 2023-10-20 DIAGNOSIS — R519 Headache, unspecified: Secondary | ICD-10-CM | POA: Diagnosis not present

## 2023-10-20 DIAGNOSIS — Z993 Dependence on wheelchair: Secondary | ICD-10-CM | POA: Diagnosis not present

## 2023-10-20 DIAGNOSIS — M25552 Pain in left hip: Secondary | ICD-10-CM | POA: Diagnosis not present

## 2023-10-20 DIAGNOSIS — R7401 Elevation of levels of liver transaminase levels: Secondary | ICD-10-CM | POA: Diagnosis not present

## 2023-10-20 DIAGNOSIS — M797 Fibromyalgia: Secondary | ICD-10-CM | POA: Diagnosis not present

## 2023-10-20 DIAGNOSIS — I341 Nonrheumatic mitral (valve) prolapse: Secondary | ICD-10-CM | POA: Diagnosis not present

## 2023-10-20 DIAGNOSIS — J9601 Acute respiratory failure with hypoxia: Secondary | ICD-10-CM | POA: Diagnosis not present

## 2023-10-20 DIAGNOSIS — J189 Pneumonia, unspecified organism: Secondary | ICD-10-CM | POA: Diagnosis not present

## 2023-10-20 DIAGNOSIS — G4489 Other headache syndrome: Secondary | ICD-10-CM | POA: Diagnosis not present

## 2023-10-20 DIAGNOSIS — T402X5A Adverse effect of other opioids, initial encounter: Secondary | ICD-10-CM | POA: Diagnosis not present

## 2023-10-20 DIAGNOSIS — K8050 Calculus of bile duct without cholangitis or cholecystitis without obstruction: Secondary | ICD-10-CM | POA: Diagnosis not present

## 2023-10-20 DIAGNOSIS — J90 Pleural effusion, not elsewhere classified: Secondary | ICD-10-CM | POA: Diagnosis not present

## 2023-10-20 DIAGNOSIS — K219 Gastro-esophageal reflux disease without esophagitis: Secondary | ICD-10-CM | POA: Diagnosis not present

## 2023-10-20 DIAGNOSIS — T40602A Poisoning by unspecified narcotics, intentional self-harm, initial encounter: Secondary | ICD-10-CM | POA: Diagnosis not present

## 2023-10-20 DIAGNOSIS — K6289 Other specified diseases of anus and rectum: Secondary | ICD-10-CM | POA: Diagnosis not present

## 2023-10-20 DIAGNOSIS — K3189 Other diseases of stomach and duodenum: Secondary | ICD-10-CM | POA: Diagnosis not present

## 2023-10-20 DIAGNOSIS — N3289 Other specified disorders of bladder: Secondary | ICD-10-CM | POA: Diagnosis not present

## 2023-10-20 DIAGNOSIS — N390 Urinary tract infection, site not specified: Secondary | ICD-10-CM | POA: Diagnosis not present

## 2023-10-21 DIAGNOSIS — R339 Retention of urine, unspecified: Secondary | ICD-10-CM | POA: Diagnosis not present

## 2023-10-21 DIAGNOSIS — G894 Chronic pain syndrome: Secondary | ICD-10-CM | POA: Diagnosis not present

## 2023-10-21 DIAGNOSIS — R7401 Elevation of levels of liver transaminase levels: Secondary | ICD-10-CM | POA: Diagnosis not present

## 2023-10-21 DIAGNOSIS — J9601 Acute respiratory failure with hypoxia: Secondary | ICD-10-CM | POA: Diagnosis not present

## 2023-10-21 DIAGNOSIS — N3000 Acute cystitis without hematuria: Secondary | ICD-10-CM | POA: Diagnosis not present

## 2023-10-21 DIAGNOSIS — W19XXXA Unspecified fall, initial encounter: Secondary | ICD-10-CM | POA: Diagnosis not present

## 2023-10-21 DIAGNOSIS — I959 Hypotension, unspecified: Secondary | ICD-10-CM | POA: Diagnosis not present

## 2023-10-21 DIAGNOSIS — F333 Major depressive disorder, recurrent, severe with psychotic symptoms: Secondary | ICD-10-CM | POA: Diagnosis not present

## 2023-10-21 DIAGNOSIS — K828 Other specified diseases of gallbladder: Secondary | ICD-10-CM | POA: Diagnosis not present

## 2023-10-21 DIAGNOSIS — K8020 Calculus of gallbladder without cholecystitis without obstruction: Secondary | ICD-10-CM | POA: Diagnosis not present

## 2023-10-21 DIAGNOSIS — J189 Pneumonia, unspecified organism: Secondary | ICD-10-CM | POA: Diagnosis not present

## 2023-10-22 DIAGNOSIS — W19XXXA Unspecified fall, initial encounter: Secondary | ICD-10-CM | POA: Diagnosis not present

## 2023-10-22 DIAGNOSIS — I959 Hypotension, unspecified: Secondary | ICD-10-CM | POA: Diagnosis not present

## 2023-10-22 DIAGNOSIS — K828 Other specified diseases of gallbladder: Secondary | ICD-10-CM | POA: Diagnosis not present

## 2023-10-22 DIAGNOSIS — R339 Retention of urine, unspecified: Secondary | ICD-10-CM | POA: Diagnosis not present

## 2023-10-22 DIAGNOSIS — J189 Pneumonia, unspecified organism: Secondary | ICD-10-CM | POA: Diagnosis not present

## 2023-10-22 DIAGNOSIS — J9601 Acute respiratory failure with hypoxia: Secondary | ICD-10-CM | POA: Diagnosis not present

## 2023-10-22 DIAGNOSIS — G894 Chronic pain syndrome: Secondary | ICD-10-CM | POA: Diagnosis not present

## 2023-10-22 DIAGNOSIS — K8020 Calculus of gallbladder without cholecystitis without obstruction: Secondary | ICD-10-CM | POA: Diagnosis not present

## 2023-10-22 DIAGNOSIS — N3000 Acute cystitis without hematuria: Secondary | ICD-10-CM | POA: Diagnosis not present

## 2023-10-22 DIAGNOSIS — F333 Major depressive disorder, recurrent, severe with psychotic symptoms: Secondary | ICD-10-CM | POA: Diagnosis not present

## 2023-10-22 DIAGNOSIS — R7401 Elevation of levels of liver transaminase levels: Secondary | ICD-10-CM | POA: Diagnosis not present

## 2023-10-23 DIAGNOSIS — G894 Chronic pain syndrome: Secondary | ICD-10-CM | POA: Diagnosis not present

## 2023-10-23 DIAGNOSIS — K828 Other specified diseases of gallbladder: Secondary | ICD-10-CM | POA: Diagnosis not present

## 2023-10-23 DIAGNOSIS — W19XXXA Unspecified fall, initial encounter: Secondary | ICD-10-CM | POA: Diagnosis not present

## 2023-10-23 DIAGNOSIS — F333 Major depressive disorder, recurrent, severe with psychotic symptoms: Secondary | ICD-10-CM | POA: Diagnosis not present

## 2023-10-23 DIAGNOSIS — J9601 Acute respiratory failure with hypoxia: Secondary | ICD-10-CM | POA: Diagnosis not present

## 2023-10-23 DIAGNOSIS — R7401 Elevation of levels of liver transaminase levels: Secondary | ICD-10-CM | POA: Diagnosis not present

## 2023-10-23 DIAGNOSIS — K8020 Calculus of gallbladder without cholecystitis without obstruction: Secondary | ICD-10-CM | POA: Diagnosis not present

## 2023-10-23 DIAGNOSIS — N3000 Acute cystitis without hematuria: Secondary | ICD-10-CM | POA: Diagnosis not present

## 2023-10-23 DIAGNOSIS — R339 Retention of urine, unspecified: Secondary | ICD-10-CM | POA: Diagnosis not present

## 2023-10-23 DIAGNOSIS — I959 Hypotension, unspecified: Secondary | ICD-10-CM | POA: Diagnosis not present

## 2023-10-23 DIAGNOSIS — J189 Pneumonia, unspecified organism: Secondary | ICD-10-CM | POA: Diagnosis not present

## 2023-10-24 DIAGNOSIS — R339 Retention of urine, unspecified: Secondary | ICD-10-CM | POA: Diagnosis not present

## 2023-10-24 DIAGNOSIS — G894 Chronic pain syndrome: Secondary | ICD-10-CM | POA: Diagnosis not present

## 2023-10-24 DIAGNOSIS — J9601 Acute respiratory failure with hypoxia: Secondary | ICD-10-CM | POA: Diagnosis not present

## 2023-10-24 DIAGNOSIS — F333 Major depressive disorder, recurrent, severe with psychotic symptoms: Secondary | ICD-10-CM | POA: Diagnosis not present

## 2023-10-24 DIAGNOSIS — J189 Pneumonia, unspecified organism: Secondary | ICD-10-CM | POA: Diagnosis not present

## 2023-10-24 DIAGNOSIS — R7401 Elevation of levels of liver transaminase levels: Secondary | ICD-10-CM | POA: Diagnosis not present

## 2023-10-24 DIAGNOSIS — K8020 Calculus of gallbladder without cholecystitis without obstruction: Secondary | ICD-10-CM | POA: Diagnosis not present

## 2023-10-24 DIAGNOSIS — N3000 Acute cystitis without hematuria: Secondary | ICD-10-CM | POA: Diagnosis not present

## 2023-10-24 DIAGNOSIS — K828 Other specified diseases of gallbladder: Secondary | ICD-10-CM | POA: Diagnosis not present

## 2023-10-24 DIAGNOSIS — I959 Hypotension, unspecified: Secondary | ICD-10-CM | POA: Diagnosis not present

## 2023-10-24 DIAGNOSIS — W19XXXA Unspecified fall, initial encounter: Secondary | ICD-10-CM | POA: Diagnosis not present

## 2023-10-25 DIAGNOSIS — J9601 Acute respiratory failure with hypoxia: Secondary | ICD-10-CM | POA: Diagnosis not present

## 2023-10-25 DIAGNOSIS — K828 Other specified diseases of gallbladder: Secondary | ICD-10-CM | POA: Diagnosis not present

## 2023-10-25 DIAGNOSIS — G894 Chronic pain syndrome: Secondary | ICD-10-CM | POA: Diagnosis not present

## 2023-10-25 DIAGNOSIS — R7401 Elevation of levels of liver transaminase levels: Secondary | ICD-10-CM | POA: Diagnosis not present

## 2023-10-25 DIAGNOSIS — N3000 Acute cystitis without hematuria: Secondary | ICD-10-CM | POA: Diagnosis not present

## 2023-10-25 DIAGNOSIS — W19XXXA Unspecified fall, initial encounter: Secondary | ICD-10-CM | POA: Diagnosis not present

## 2023-10-25 DIAGNOSIS — K8020 Calculus of gallbladder without cholecystitis without obstruction: Secondary | ICD-10-CM | POA: Diagnosis not present

## 2023-10-25 DIAGNOSIS — K8010 Calculus of gallbladder with chronic cholecystitis without obstruction: Secondary | ICD-10-CM | POA: Diagnosis not present

## 2023-10-25 DIAGNOSIS — J189 Pneumonia, unspecified organism: Secondary | ICD-10-CM | POA: Diagnosis not present

## 2023-10-25 DIAGNOSIS — I959 Hypotension, unspecified: Secondary | ICD-10-CM | POA: Diagnosis not present

## 2023-10-25 DIAGNOSIS — R339 Retention of urine, unspecified: Secondary | ICD-10-CM | POA: Diagnosis not present

## 2023-10-25 NOTE — Progress Notes (Signed)
I didn't see the patient.

## 2023-10-26 DIAGNOSIS — R7401 Elevation of levels of liver transaminase levels: Secondary | ICD-10-CM | POA: Diagnosis not present

## 2023-10-26 DIAGNOSIS — K8050 Calculus of bile duct without cholangitis or cholecystitis without obstruction: Secondary | ICD-10-CM | POA: Diagnosis not present

## 2023-10-26 DIAGNOSIS — N3000 Acute cystitis without hematuria: Secondary | ICD-10-CM | POA: Diagnosis not present

## 2023-10-26 DIAGNOSIS — J9601 Acute respiratory failure with hypoxia: Secondary | ICD-10-CM | POA: Diagnosis not present

## 2023-10-27 DIAGNOSIS — J9601 Acute respiratory failure with hypoxia: Secondary | ICD-10-CM | POA: Diagnosis not present

## 2023-10-27 DIAGNOSIS — K8050 Calculus of bile duct without cholangitis or cholecystitis without obstruction: Secondary | ICD-10-CM | POA: Diagnosis not present

## 2023-10-27 DIAGNOSIS — N3000 Acute cystitis without hematuria: Secondary | ICD-10-CM | POA: Diagnosis not present

## 2023-10-27 DIAGNOSIS — R7401 Elevation of levels of liver transaminase levels: Secondary | ICD-10-CM | POA: Diagnosis not present

## 2023-10-28 DIAGNOSIS — N3000 Acute cystitis without hematuria: Secondary | ICD-10-CM | POA: Diagnosis not present

## 2023-10-28 DIAGNOSIS — K8050 Calculus of bile duct without cholangitis or cholecystitis without obstruction: Secondary | ICD-10-CM | POA: Diagnosis not present

## 2023-10-28 DIAGNOSIS — J9601 Acute respiratory failure with hypoxia: Secondary | ICD-10-CM | POA: Diagnosis not present

## 2023-10-28 DIAGNOSIS — R7401 Elevation of levels of liver transaminase levels: Secondary | ICD-10-CM | POA: Diagnosis not present

## 2023-10-29 DIAGNOSIS — R7401 Elevation of levels of liver transaminase levels: Secondary | ICD-10-CM | POA: Diagnosis not present

## 2023-10-29 DIAGNOSIS — J9601 Acute respiratory failure with hypoxia: Secondary | ICD-10-CM | POA: Diagnosis not present

## 2023-10-29 DIAGNOSIS — K8050 Calculus of bile duct without cholangitis or cholecystitis without obstruction: Secondary | ICD-10-CM | POA: Diagnosis not present

## 2023-10-29 DIAGNOSIS — N3000 Acute cystitis without hematuria: Secondary | ICD-10-CM | POA: Diagnosis not present

## 2023-10-30 DIAGNOSIS — K8050 Calculus of bile duct without cholangitis or cholecystitis without obstruction: Secondary | ICD-10-CM | POA: Diagnosis not present

## 2023-10-30 DIAGNOSIS — J9601 Acute respiratory failure with hypoxia: Secondary | ICD-10-CM | POA: Diagnosis not present

## 2023-10-30 DIAGNOSIS — N3000 Acute cystitis without hematuria: Secondary | ICD-10-CM | POA: Diagnosis not present

## 2023-10-30 DIAGNOSIS — R7401 Elevation of levels of liver transaminase levels: Secondary | ICD-10-CM | POA: Diagnosis not present

## 2023-10-31 ENCOUNTER — Telehealth: Payer: Self-pay | Admitting: *Deleted

## 2023-10-31 NOTE — Transitions of Care (Post Inpatient/ED Visit) (Signed)
 10/31/2023  Name: Karen [name] MRN: [id number] DOB: 1976-03-23  Today's TOC FU Call Status: Today's TOC FU Call Status:: Successful TOC FU Call Completed TOC FU Call Complete Date: 10/31/23 Patient's Name and Date of Birth confirmed.  Transition Care Management Follow-up Telephone Call Date of Discharge: 10/30/23 Discharge Facility: Other ([facility]) Name of Other ([facility]) Discharge Facility: [facility] Type of Discharge: Inpatient Admission Primary Inpatient Discharge Diagnosis:: narcotic overdose; AMS with hypoxia; abdominal pain- surgical cholecestectomy How have you been since you were released from the hospital?: Better ("I am doing fine; a little sore from surgery, but okay.  I left the inpatient behavioral facility and am now back living with my mother.  I have regulat follow up with my psychiatrist and their social worker at [facility], right now I don't need any more help") Any questions or concerns?: No  Items Reviewed: Did you receive and understand the discharge instructions provided?: Yes (briefly reviewed with patient who verbalizes good understanding of same - outside hospital AVS) Medications obtained,verified, and reconciled?: No Medications Not Reviewed Reasons:: Other: (patient declined- she is lying down and her discharge papers are not near her; states, "they made changes at [facility] that I don't agree with, so I am not doing all that.  I want to discuss it all with my doctors who know me") Any new allergies since your discharge?: No Dietary orders reviewed?: Yes Type of Diet Ordered:: "Pretty healthy" Do you have support at home?: Yes People in Home: parent(s) Name of Support/Comfort Primary Source: Reports essentially independent in self-care activities; primarily resides with supportive mother who assists as/ if needed/ indicated - however, she reports she also occasionally resides at [facility] facility: she is unclear around details re: Behavioral  facility, states "sometimes I go there"  Medications Reviewed Today: Medications Reviewed Today     Reviewed by Michaela Corner, RN (Registered Nurse) on 10/31/23 at 1138  Med List Status: <None>   Medication Order Taking? Sig Documenting Provider Last Dose Status Informant  acetaminophen (TYLENOL) 500 MG tablet [id number] No Take 1,000 mg by mouth every 6 (six) hours as needed for mild pain (pain score 1-3) or moderate pain (pain score 4-6). [provider] 09/26/2023 Active Self, Pharmacy Records           Med Note Michaela Corner   Tue Oct 31, 2023 11:38 AM) 10/31/23: denies medication review during TOC call today: states "I don't agree with the changes the doctors at [facility] made and I am not doing any of that until I talk to my psychiatrist team and my regular doctors"  albuterol (PROVENTIL) (2.5 MG/3ML) 0.083% nebulizer solution [id number] No Take 3 mLs (2.5 mg total) by nebulization every 6 (six) hours as needed for wheezing or shortness of breath.  Patient taking differently: Take 2.5 mg by nebulization daily as needed for wheezing or shortness of breath.   Debera Lat, PA-C 09/26/2023 Active Self, Pharmacy Records  albuterol (VENTOLIN HFA) 108 (90 Base) MCG/ACT inhaler [id number] No Inhale 2 puffs into the lungs every 6 (six) hours as needed. Malva Limes, MD 09/27/2023 Active Self, Pharmacy Records  Armodafinil 150 MG tablet [id number]  Take 1 tablet (150 mg total) by mouth daily. Malva Limes, MD  Active   busPIRone (BUSPAR) 10 MG tablet [id number] No TAKE 1 TABLET (10 MG TOTAL) BY MOUTH 2 (TWO) TIMES DAILY. TAKE ALONG WITH 15MG  TABLET TWICE A DAY Malva Limes, MD 09/26/2023 Active Self, Pharmacy Records  Med Note (CRUTHIS, CHLOE C   Sun Oct 01, 2023 10:36 AM) Pt takes both the 10 mg and 15 mg dose BID.   busPIRone (BUSPAR) 15 MG tablet [id number] No Take 1 tablet (15 mg total) by mouth 2 (two) times daily. Malva Limes, MD 09/26/2023 Active Self,  Pharmacy Records           Med Note (CRUTHIS, Seth Bake Oct 01, 2023 10:36 AM) Pt takes both the 10 mg and 15 mg dose BID.   celecoxib (CELEBREX) 200 MG capsule [id number] No Take 1 capsule (200 mg total) by mouth 2 (two) times daily. Malva Limes, MD 09/26/2023 Active Self, Pharmacy Records  diclofenac Sodium (VOLTAREN) 1 % GEL [id number] No APPLY AS NEEDED 3 TIMES A DAY  Patient not taking: Reported on 09/29/2023   Malva Limes, MD Not Taking Active Self, Pharmacy Records  docusate sodium (COLACE) 100 MG capsule [id number] No Take 200 mg by mouth daily. [provider] 09/26/2023 Active Self, Pharmacy Records  DULoxetine (CYMBALTA) 60 MG capsule [id number] No TAKE 2 CAPSULES BY MOUTH DAILY Malva Limes, MD 09/26/2023 Active Self, Pharmacy Records  fentaNYL (DURAGESIC) 100 MCG/HR [id number]  Place 1 patch onto the skin every 3 (three) days. Malva Limes, MD  Active   fexofenadine [facility]) 180 MG tablet [id number]  Take 1 tablet (180 mg total) by mouth daily. Malva Limes, MD  Active   fluticasone-salmeterol (ADVAIR DISKUS) 250-50 MCG/ACT AEPB [id number] No Inhale 1 puff into the lungs in the morning and at bedtime. Debera Lat, PA-C 09/26/2023 Active Self, Pharmacy Records  gabapentin (NEURONTIN) 800 MG tablet [id number] No TAKE 1 TABLET BY MOUTH THREE TIMES A DAY Malva Limes, MD 09/26/2023 Active Self, Pharmacy Records  haloperidol (HALDOL) 0.5 MG tablet [id number] No TAKE 1/2 OF A TABLET (0.25 MG TOTAL) BY MOUTH TWICE A DAY  Patient taking differently: Take 1 mg by mouth daily.   Elveria Rising, NP 09/26/2023 Active Self, Pharmacy Records           Med Note (CRUTHIS, CHLOE C   Fri Sep 29, 2023  3:20 PM)    lurasidone (LATUDA) 80 MG TABS tablet [id number] No Take 1 tablet (80 mg total) by mouth daily with supper.  Patient not taking: Reported on 09/29/2023   Clapacs, Jackquline Denmark, MD Not Taking Active Self, Pharmacy Records  Lurasidone HCl (LATUDA) 120 MG TABS [id number] No  Take 120 mg by mouth daily. [provider] 09/26/2023 Active Self, Pharmacy Records  Melatonin 10 MG TABS [id number] No Take 20 mg by mouth at bedtime. [provider] 09/26/2023 Active Self, Pharmacy Records  methocarbamol (ROBAXIN) 500 MG tablet [id number] No Take 1 tablet (500 mg total) by mouth every 8 (eight) hours as needed.  Patient not taking: Reported on 09/29/2023   Janith Lima, MD Not Taking Active Self, Pharmacy Records  Multiple Vitamins-Minerals [facility] SKIN & NAILS PO) [id number] No Take 2 tablets by mouth daily. [provider] 09/26/2023 Active Self, Pharmacy Records  Multiple Vitamins-Minerals (MULTIVITAMIN WITH MINERALS) tablet [id number] No Take 2 tablets by mouth daily. [provider] 09/26/2023 Active Self, Pharmacy Records  naloxone [facility]) nasal spray 4 mg/0.1 mL [id number] No Place 1 spray into the nose as needed. [provider] Taking Active Self, Pharmacy Records           Med Note (CRUTHIS, CHLOE C   Fri Sep 29, 2023  3:26 PM) Never used  omeprazole (PRILOSEC) 40 MG  capsule [id number] No TAKE 1 CAPSULE BY MOUTH TWICE A DAY Malva Limes, MD 09/26/2023 Active Self, Pharmacy Records  ondansetron (ZOFRAN-ODT) 4 MG disintegrating tablet [id number] No DISSOLVE 1 TABLET IN MOUTH EVERY 8 HOURS FOR NAUSEA AND VOMITING Malva Limes, MD 09/26/2023 Active Self, Pharmacy Records  predniSONE (DELTASONE) 20 MG tablet [id number] No Take 1 tablet (20 mg total) by mouth daily with breakfast.  Patient not taking: Reported on 09/29/2023   Debera Lat, PA-C Not Taking Active Self, Pharmacy Records  QUEtiapine (SEROQUEL) 100 MG tablet [id number] No Take 1 tablet (100 mg total) by mouth at bedtime. Malva Limes, MD 09/26/2023 Active Self, Pharmacy Records           Home Care and Equipment/Supplies: Were Home Health Services Ordered?: No Any new equipment or medical supplies ordered?: No  Functional Questionnaire: Do you need assistance  with bathing/showering or dressing?: No Do you need assistance with meal preparation?: No Do you need assistance with eating?: No Do you have difficulty maintaining continence: No Do you need assistance with getting out of bed/getting out of a chair/moving?: No Do you have difficulty managing or taking your medications?: No  Follow up appointments reviewed: PCP Follow-up appointment confirmed?: Yes (care coordination outreach in real-time with scheduling care guide to successfully schedule hospital follow up PCP appointment 11/07/23) Date of PCP follow-up appointment?: 11/07/23 Follow-up Provider: Covering provider for PCP Specialist Hospital Follow-up appointment confirmed?: Yes Date of Specialist follow-up appointment?:  ("I am not exactly when it is scheduled, but it is in about 2 weeks with the surgeon at [facility]") Follow-Up Specialty Provider:: [facility]- surgical provider Do you need transportation to your follow-up appointment?: No Do you understand care options if your condition(s) worsen?: Yes-patient verbalized understanding  SDOH Interventions Today    Flowsheet Row Most Recent Value  SDOH Interventions   Food Insecurity Interventions Intervention Not Indicated  [confirmed that patient was provided resources for food insecurity on 09/20/23 by LCSW: today during TOC call, she denies food insecurity and states "I will call the social worker if I need anything"]  Housing Interventions Intervention Not Indicated  Transportation Interventions Intervention Not Indicated  [mother provides transportation]  Utilities Interventions Intervention Not Indicated  [confirmed that patient was provided resources for utility costs on 09/20/23 by LCSW: today during TOC call, she denies additional needs and states "I will call the social worker if I need anything"]      Interventions Today    Flowsheet Row Most Recent Value  Chronic Disease   Chronic disease during today's visit Other  [surgical  cholecystectomy,  AMS/ hypoxia/ narcotic overdose]  General Interventions   General Interventions Discussed/Reviewed General Interventions Discussed, Doctor Visits, Durable Medical Equipment (DME)  Doctor Visits Discussed/Reviewed Doctor Visits Discussed, PCP, Specialist  Durable Medical Equipment (DME) Wheelchair  [confirmed uses assistive devices on regular basis, at baseline -- manual and Paramedic, Motorized  PCP/Specialist Visits Compliance with follow-up visit  Mental Health Interventions   Mental Health Discussed/Reviewed Mental Health Discussed  [confirmed patient remains well-established with psychiatry team at [facility]]  Nutrition Interventions   Nutrition Discussed/Reviewed Nutrition Discussed  Pharmacy Interventions   Pharmacy Dicussed/Reviewed Pharmacy Topics Discussed  Safety Interventions   Safety Discussed/Reviewed Safety Discussed, Fall Risk  [provided education/ reinforcement around fall prevention]      TOC Interventions Today    Flowsheet Row Most Recent Value  TOC Interventions   TOC Interventions Discussed/Reviewed TOC Interventions Discussed, Post op wound/incision care  [Patient declines need  for ongoing/ further care management/ coordination outreach,  declines enrollment in 30-day TOC program- declines taking my direct phone number should needs/ concerns arise post-TOC call]      Total time spent from review to signing of note/ including any care coordination interventions:  48 minutes  Pls call/ message for questions,  Caryl Pina, RN, BSN, CCRN Alumnus RN Care Manager  Transitions of Care  [facility] [phone]: direct office

## 2023-11-03 ENCOUNTER — Encounter: Payer: Self-pay | Admitting: Family Medicine

## 2023-11-07 ENCOUNTER — Ambulatory Visit: Admitting: Physician Assistant

## 2023-11-07 ENCOUNTER — Encounter: Payer: Self-pay | Admitting: Physician Assistant

## 2023-11-07 VITALS — BP 109/64 | HR 77 | Resp 16 | Ht 59.0 in | Wt 125.4 lb

## 2023-11-07 DIAGNOSIS — F331 Major depressive disorder, recurrent, moderate: Secondary | ICD-10-CM

## 2023-11-07 DIAGNOSIS — K9089 Other intestinal malabsorption: Secondary | ICD-10-CM

## 2023-11-07 DIAGNOSIS — Z09 Encounter for follow-up examination after completed treatment for conditions other than malignant neoplasm: Secondary | ICD-10-CM | POA: Diagnosis not present

## 2023-11-07 DIAGNOSIS — S069X9A Unspecified intracranial injury with loss of consciousness of unspecified duration, initial encounter: Secondary | ICD-10-CM

## 2023-11-08 NOTE — Progress Notes (Unsigned)
 Established patient visit  Patient: Karen [name]   DOB: 1975-10-04   48 y.o. Female  MRN: [id number] Visit Date: 11/07/2023  Today's healthcare provider: Debera Lat, PA-C   Chief Complaint  Patient presents with   Hospitalization Follow-up    Hospital f/u / seliatic disease is back, ulcers referral to GI    Subjective      Discussed the use of AI scribe software for clinical note transcription with the patient, who gave verbal consent to proceed.  History of Present Illness The patient, with a complex medical history including multiple head injuries, psychiatric conditions, and gastrointestinal issues, presents with headaches and generalized weakness. She reports a recent head injury due to a fall caused by an overdose of her medication, which resulted in loss of consciousness and subsequent headaches at the site of impact. The patient also reports a history of multiple head injuries, with significant cognitive and physical impact.  In addition to the headaches, the patient reports generalized weakness, which she attributes to her medication regimen and possible celiac disease. She reports experiencing diarrhea when consuming grains. The patient also suspects she may have ulcers, but this has not been confirmed.  The patient's psychiatric conditions are managed with multiple antipsychotic medications, including Abilify, Seroquel, and Haldol. She reports some concerns about the number of antipsychotics she is taking and plans to discuss this with her psychiatrist.  The patient's caregiver corroborates the patient's history and symptoms, and provides additional context about the impact of the patient's conditions on her daily life.       11/07/2023    3:07 PM 09/20/2023    2:17 PM 09/20/2023    2:14 PM  Depression screen PHQ 2/9  Decreased Interest 1 1 1   Down, Depressed, Hopeless 2 3 3   PHQ - 2 Score 3 4 4   Altered sleeping 1 3   Tired, decreased energy 1 3   Change in  appetite 1 1   Feeling bad or failure about yourself  2 1   Trouble concentrating 2 3   Moving slowly or fidgety/restless 0 1   Suicidal thoughts 0 0   PHQ-9 Score 10 16   Difficult doing work/chores Very difficult        11/07/2023    3:08 PM 09/12/2023    1:35 PM 05/26/2023   10:38 AM 05/09/2023    2:50 PM  GAD 7 : Generalized Anxiety Score  Nervous, Anxious, on Edge 0 1 1 3   Control/stop worrying 0 3 3 3   Worry too much - different things 2 3 3 3   Trouble relaxing 0 0 0 0  Restless 0 0 0 0  Easily annoyed or irritable 1 1 3 3   Afraid - awful might happen 0 1 1 2   Total GAD 7 Score 3 9 11 14   Anxiety Difficulty  Somewhat difficult      Medications: Outpatient Medications Prior to Visit  Medication Sig   acetaminophen (TYLENOL) 500 MG tablet Take 1,000 mg by mouth every 6 (six) hours as needed for mild pain (pain score 1-3) or moderate pain (pain score 4-6).   albuterol (PROVENTIL) (2.5 MG/3ML) 0.083% nebulizer solution Take 3 mLs (2.5 mg total) by nebulization every 6 (six) hours as needed for wheezing or shortness of breath.   albuterol (VENTOLIN HFA) 108 (90 Base) MCG/ACT inhaler Inhale 2 puffs into the lungs every 6 (six) hours as needed.   Armodafinil 150 MG tablet Take 1 tablet (150 mg total) by mouth daily.  busPIRone (BUSPAR) 10 MG tablet TAKE 1 TABLET (10 MG TOTAL) BY MOUTH 2 (TWO) TIMES DAILY. TAKE ALONG WITH 15MG  TABLET TWICE A DAY   busPIRone (BUSPAR) 15 MG tablet Take 1 tablet (15 mg total) by mouth 2 (two) times daily.   celecoxib (CELEBREX) 200 MG capsule Take 1 capsule (200 mg total) by mouth 2 (two) times daily.   diclofenac Sodium (VOLTAREN) 1 % GEL APPLY AS NEEDED 3 TIMES A DAY   docusate sodium (COLACE) 100 MG capsule Take 200 mg by mouth daily.   DULoxetine (CYMBALTA) 60 MG capsule TAKE 2 CAPSULES BY MOUTH DAILY   fentaNYL (DURAGESIC) 100 MCG/HR Place 1 patch onto the skin every 3 (three) days.   fexofenadine (ALLEGRA) 180 MG tablet Take 1 tablet (180 mg  total) by mouth daily.   fluticasone-salmeterol (ADVAIR DISKUS) 250-50 MCG/ACT AEPB Inhale 1 puff into the lungs in the morning and at bedtime.   gabapentin (NEURONTIN) 800 MG tablet TAKE 1 TABLET BY MOUTH THREE TIMES A DAY   haloperidol (HALDOL) 0.5 MG tablet TAKE 1/2 OF A TABLET (0.25 MG TOTAL) BY MOUTH TWICE A DAY   lurasidone (LATUDA) 80 MG TABS tablet Take 1 tablet (80 mg total) by mouth daily with supper.   Lurasidone HCl (LATUDA) 120 MG TABS Take 120 mg by mouth daily.   Melatonin 10 MG TABS Take 20 mg by mouth at bedtime.   methocarbamol (ROBAXIN) 500 MG tablet Take 1 tablet (500 mg total) by mouth every 8 (eight) hours as needed.   Multiple Vitamins-Minerals (HAIR SKIN & NAILS PO) Take 2 tablets by mouth daily.   Multiple Vitamins-Minerals (MULTIVITAMIN WITH MINERALS) tablet Take 2 tablets by mouth daily.   naloxone (NARCAN) nasal spray 4 mg/0.1 mL Place 1 spray into the nose as needed.   omeprazole (PRILOSEC) 40 MG capsule TAKE 1 CAPSULE BY MOUTH TWICE A DAY   ondansetron (ZOFRAN-ODT) 4 MG disintegrating tablet DISSOLVE 1 TABLET IN MOUTH EVERY 8 HOURS FOR NAUSEA AND VOMITING   predniSONE (DELTASONE) 20 MG tablet Take 1 tablet (20 mg total) by mouth daily with breakfast.   QUEtiapine (SEROQUEL) 100 MG tablet Take 1 tablet (100 mg total) by mouth at bedtime.   No facility-administered medications prior to visit.    Review of Systems All negative Except see HPI   {Insert previous labs (optional):23779} {See past labs  Heme  Chem  Endocrine  Serology  Results Review (optional):1}   Objective    BP 109/64 (BP Location: Right Arm, Patient Position: Sitting, Cuff Size: Normal)   Pulse 77   Resp 16   Ht 4\' 11"  (1.499 m)   Wt 125 lb 6.4 oz (56.9 kg)   SpO2 99%   BMI 25.33 kg/m  {Insert last BP/Wt (optional):23777}{See vitals history (optional):1}   Physical Exam   No results found for any visits on 11/07/23.      Assessment and Plan Assessment & Plan Head Injury  with Concussion Concussion with post-concussion symptoms despite no acute CT findings. Symptoms consistent with mild traumatic brain injury. - Discuss with behavioral health specialist regarding medication regimen including Abilify, Seroquel, and Haldol to minimize fall risk. - Consider helmet or protective gear to prevent future head injuries. - Advise caution to avoid fall-inducing situations and medication adjustments.  Medication Management Concern about cumulative side effects and interactions of multiple medications, particularly antipsychotics. - Consult with behavioral health specialist to review and adjust medication regimen, focusing on antipsychotics. - Emphasize importance of consulting healthcare providers before changing  medications.  Celiac Disease Symptoms suggestive of celiac disease with grain consumption. - Advise grain avoidance and symptom monitoring. - Consider further evaluation if symptoms persist.  General Health Maintenance Advised vigilance in health maintenance and injury prevention. - Encourage nasal saline spray for congestion, consider Flonase if persistent.  Follow-up Advised follow-up with specialists for condition and medication management. - Schedule follow-up with behavioral health specialist for medication review. - Schedule follow-up with primary care provider as needed.    No orders of the defined types were placed in this encounter.   No follow-ups on file.   The patient was advised to call back or seek an in-person evaluation if the symptoms worsen or if the condition fails to improve as anticipated.  I discussed the assessment and treatment plan with the patient. The patient was provided an opportunity to ask questions and all were answered. The patient agreed with the plan and demonstrated an understanding of the instructions.  I, Debera Lat, PA-C have reviewed all documentation for this visit. The documentation on 11/07/2023  for the  exam, diagnosis, procedures, and orders are all accurate and complete.  Debera Lat, [facility], MMS [facility] [phone] (phone) [phone] (fax)  [facility]

## 2023-11-08 NOTE — Progress Notes (Incomplete)
 Established patient visit  Patient: Karen [name]   DOB: 02-01-1976   48 y.o. Female  MRN: [id number] Visit Date: 11/07/2023  Today's healthcare provider: Debera Lat, PA-C   Chief Complaint  Patient presents with  . Hospitalization Follow-up    Hospital f/u / seliatic disease is back, ulcers referral to GI    Subjective      Discussed the use of AI scribe software for clinical note transcription with the patient, who gave verbal consent to proceed.  History of Present Illness The patient, with a complex medical history including multiple head injuries, psychiatric conditions, and gastrointestinal issues, presents with headaches and generalized weakness. She reports a recent head injury due to a fall caused by an overdose of her medication, which resulted in loss of consciousness and subsequent headaches at the site of impact. The patient also reports a history of multiple head injuries, with significant cognitive and physical impact.  In addition to the headaches, the patient reports generalized weakness, which she attributes to her medication regimen and possible celiac disease. She reports experiencing diarrhea when consuming grains. The patient also suspects she may have ulcers, but this has not been confirmed.  The patient's psychiatric conditions are managed with multiple antipsychotic medications, including Abilify, Seroquel, and Haldol. She reports some concerns about the number of antipsychotics she is taking and plans to discuss this with her psychiatrist.  The patient's caregiver corroborates the patient's history and symptoms, and provides additional context about the impact of the patient's conditions on her daily life.  Pt was discharged from the [facility] on 10/30/23 Follow up Hospitalization  [facility] and discharged on 10/30/23. She was treated for narcotic overdose, AMS with hypoxia, abd.pain, acute  encephalopathy, aspirational pneumonia  Treatment for this included cholecystectomy.. Telephone follow up was done on 3/08/23/23 She reports fair compliance with treatment. She reports this condition is improved.  ----------------------------------------------------------------------------------------- -      10/31/2023   Name: Karen [name]       MRN: [id number]       DOB: 08/25/1975   Today's TOC FU Call Status: Today's TOC FU Call Status:: Successful TOC FU Call Completed TOC FU Call Complete Date: 10/31/23 Patient's Name and Date of Birth confirmed.   Transition Care Management Follow-up Telephone Call Date of Discharge: 10/30/23 Discharge Facility: Other ([facility]) Name of Other ([facility]) Discharge Facility: [facility] Type of Discharge: Inpatient Admission Primary Inpatient Discharge Diagnosis:: narcotic overdose; AMS with hypoxia; abdominal pain- surgical cholecestectomy How have you been since you were released from the hospital?: Better ("I am doing fine; a little sore from surgery, but okay.  I left the inpatient behavioral facility and am now back living with my mother.  I have regulat follow up with my psychiatrist and their social worker at [facility], right now I don't need any more help") Any questions or concerns?: No   Items Reviewed: Did you receive and understand the discharge instructions provided?: Yes (briefly reviewed with patient who verbalizes good understanding of same - outside hospital AVS) Medications obtained,verified, and reconciled?: No Medications Not Reviewed Reasons:: Other: (patient declined- she is lying down and her discharge papers are not near her; states, "they made changes at [facility] that I don't agree with, so I am not doing all that.  I want to discuss it all with my doctors who know me") Any new allergies since your discharge?: No Dietary orders reviewed?: Yes Type of Diet Ordered:: "Pretty healthy"  Do you have support at home?:  Yes People in Home: parent(s) Name of Support/Comfort Primary Source: Reports essentially independent in self-care activities; primarily resides with supportive mother who assists as/ if needed/ indicated - however, she reports she also occasionally resides at [facility] facility: she is unclear around details re: Behavioral facility, states "sometimes I go there"           11/07/2023    3:07 PM 09/20/2023    2:17 PM 09/20/2023    2:14 PM  Depression screen PHQ 2/9  Decreased Interest 1 1 1   Down, Depressed, Hopeless 2 3 3   PHQ - 2 Score 3 4 4   Altered sleeping 1 3   Tired, decreased energy 1 3   Change in appetite 1 1   Feeling bad or failure about yourself  2 1   Trouble concentrating 2 3   Moving slowly or fidgety/restless 0 1   Suicidal thoughts 0 0   PHQ-9 Score 10 16   Difficult doing work/chores Very difficult        11/07/2023    3:08 PM 09/12/2023    1:35 PM 05/26/2023   10:38 AM 05/09/2023    2:50 PM  GAD 7 : Generalized Anxiety Score  Nervous, Anxious, on Edge 0 1 1 3   Control/stop worrying 0 3 3 3   Worry too much - different things 2 3 3 3   Trouble relaxing 0 0 0 0  Restless 0 0 0 0  Easily annoyed or irritable 1 1 3 3   Afraid - awful might happen 0 1 1 2   Total GAD 7 Score 3 9 11 14   Anxiety Difficulty  Somewhat difficult      Medications: Outpatient Medications Prior to Visit  Medication Sig  . acetaminophen (TYLENOL) 500 MG tablet Take 1,000 mg by mouth every 6 (six) hours as needed for mild pain (pain score 1-3) or moderate pain (pain score 4-6).  Marland Kitchen albuterol (PROVENTIL) (2.5 MG/3ML) 0.083% nebulizer solution Take 3 mLs (2.5 mg total) by nebulization every 6 (six) hours as needed for wheezing or shortness of breath.  Marland Kitchen albuterol (VENTOLIN HFA) 108 (90 Base) MCG/ACT inhaler Inhale 2 puffs into the lungs every 6 (six) hours as needed.  . Armodafinil 150 MG tablet Take 1 tablet (150 mg total) by mouth daily.  . busPIRone (BUSPAR) 10 MG tablet TAKE 1  TABLET (10 MG TOTAL) BY MOUTH 2 (TWO) TIMES DAILY. TAKE ALONG WITH 15MG  TABLET TWICE A DAY  . busPIRone (BUSPAR) 15 MG tablet Take 1 tablet (15 mg total) by mouth 2 (two) times daily.  . celecoxib (CELEBREX) 200 MG capsule Take 1 capsule (200 mg total) by mouth 2 (two) times daily.  . diclofenac Sodium (VOLTAREN) 1 % GEL APPLY AS NEEDED 3 TIMES A DAY  . docusate sodium (COLACE) 100 MG capsule Take 200 mg by mouth daily.  . DULoxetine (CYMBALTA) 60 MG capsule TAKE 2 CAPSULES BY MOUTH DAILY  . fentaNYL (DURAGESIC) 100 MCG/HR Place 1 patch onto the skin every 3 (three) days.  . fexofenadine (ALLEGRA) 180 MG tablet Take 1 tablet (180 mg total) by mouth daily.  . fluticasone-salmeterol (ADVAIR DISKUS) 250-50 MCG/ACT AEPB Inhale 1 puff into the lungs in the morning and at bedtime.  . gabapentin (NEURONTIN) 800 MG tablet TAKE 1 TABLET BY MOUTH THREE TIMES A DAY  . haloperidol (HALDOL) 0.5 MG tablet TAKE 1/2 OF A TABLET (0.25 MG TOTAL) BY MOUTH TWICE A DAY  . lurasidone (LATUDA) 80 MG TABS  tablet Take 1 tablet (80 mg total) by mouth daily with supper.  . Lurasidone HCl (LATUDA) 120 MG TABS Take 120 mg by mouth daily.  . Melatonin 10 MG TABS Take 20 mg by mouth at bedtime.  . methocarbamol (ROBAXIN) 500 MG tablet Take 1 tablet (500 mg total) by mouth every 8 (eight) hours as needed.  . Multiple Vitamins-Minerals (HAIR SKIN & NAILS PO) Take 2 tablets by mouth daily.  . Multiple Vitamins-Minerals (MULTIVITAMIN WITH MINERALS) tablet Take 2 tablets by mouth daily.  . naloxone (NARCAN) nasal spray 4 mg/0.1 mL Place 1 spray into the nose as needed.  Marland Kitchen omeprazole (PRILOSEC) 40 MG capsule TAKE 1 CAPSULE BY MOUTH TWICE A DAY  . ondansetron (ZOFRAN-ODT) 4 MG disintegrating tablet DISSOLVE 1 TABLET IN MOUTH EVERY 8 HOURS FOR NAUSEA AND VOMITING  . predniSONE (DELTASONE) 20 MG tablet Take 1 tablet (20 mg total) by mouth daily with breakfast.  . QUEtiapine (SEROQUEL) 100 MG tablet Take 1 tablet (100 mg total) by  mouth at bedtime.   No facility-administered medications prior to visit.    Review of Systems All negative Except see HPI   {Insert previous labs (optional):23779} {See past labs  Heme  Chem  Endocrine  Serology  Results Review (optional):1}   Objective    BP 109/64 (BP Location: Right Arm, Patient Position: Sitting, Cuff Size: Normal)   Pulse 77   Resp 16   Ht 4\' 11"  (1.499 m)   Wt 125 lb 6.4 oz (56.9 kg)   SpO2 99%   BMI 25.33 kg/m  {Insert last BP/Wt (optional):23777}{See vitals history (optional):1}   Physical Exam Vitals reviewed.  Constitutional:      General: She is not in acute distress.    Appearance: Normal appearance. She is well-developed. She is not diaphoretic.  HENT:     Head: Normocephalic and atraumatic.  Eyes:     General: No scleral icterus.    Conjunctiva/sclera: Conjunctivae normal.  Neck:     Thyroid: No thyromegaly.  Cardiovascular:     Rate and Rhythm: Normal rate and regular rhythm.     Pulses: Normal pulses.     Heart sounds: Normal heart sounds. No murmur heard. Pulmonary:     Effort: Pulmonary effort is normal. No respiratory distress.     Breath sounds: Normal breath sounds. No wheezing, rhonchi or rales.  Musculoskeletal:     Cervical back: Neck supple.     Right lower leg: No edema.     Left lower leg: No edema.  Lymphadenopathy:     Cervical: No cervical adenopathy.  Skin:    General: Skin is warm and dry.     Findings: No rash.  Neurological:     Mental Status: She is alert and oriented to person, place, and time. Mental status is at baseline.  Psychiatric:        Mood and Affect: Mood normal.        Behavior: Behavior normal.      No results found for any visits on 11/07/23.      Assessment & Plan  Hospital discharge Head Injury with Concussion Concussion with post-concussion symptoms despite no acute CT findings. Symptoms consistent with mild traumatic brain injury. - Discuss with behavioral health specialist  regarding medication regimen including adding Abilify, Seroquel, and decreasing a dose of Haldol to minimize fall risk. - Consider helmet or protective gear to prevent future head injuries. - Advise caution to avoid fall-inducing situations and medication adjustments.  Medication Management Concern  about cumulative side effects and interactions of multiple medications, particularly antipsychotics. - Consult with behavioral health specialist at [facility] to review and adjust medication regimen, focusing on antipsychotics. - Emphasize importance of consulting healthcare providers before changing medications.  Celiac Disease Symptoms suggestive of celiac disease with grain consumption. - Advise grain avoidance and symptom monitoring. - Consider further evaluation if symptoms persist.  General Health Maintenance Advised vigilance in health maintenance and injury prevention. - Encourage nasal saline spray for congestion, consider Flonase if persistent.  Follow-up Advised follow-up with specialists for condition and medication management. - Schedule follow-up with behavioral health specialist for medication review. - Schedule follow-up with primary care provider as needed.    No orders of the defined types were placed in this encounter.   No follow-ups on file.   The patient was advised to call back or seek an in-person evaluation if the symptoms worsen or if the condition fails to improve as anticipated.  I discussed the assessment and treatment plan with the patient. The patient was provided an opportunity to ask questions and all were answered. The patient agreed with the plan and demonstrated an understanding of the instructions.  I, Debera Lat, PA-C have reviewed all documentation for this visit. The documentation on 11/07/2023  for the exam, diagnosis, procedures, and orders are all accurate and complete.  Debera Lat, [facility], MMS [facility] [phone]  (phone) [phone] (fax)  [facility]

## 2023-11-09 DIAGNOSIS — K909 Intestinal malabsorption, unspecified: Secondary | ICD-10-CM | POA: Insufficient documentation

## 2023-11-09 DIAGNOSIS — S069X9A Unspecified intracranial injury with loss of consciousness of unspecified duration, initial encounter: Secondary | ICD-10-CM | POA: Insufficient documentation

## 2023-11-09 DIAGNOSIS — Z09 Encounter for follow-up examination after completed treatment for conditions other than malignant neoplasm: Secondary | ICD-10-CM | POA: Insufficient documentation

## 2023-11-10 ENCOUNTER — Other Ambulatory Visit: Payer: Self-pay | Admitting: Family Medicine

## 2023-11-10 DIAGNOSIS — M5136 Other intervertebral disc degeneration, lumbar region with discogenic back pain only: Secondary | ICD-10-CM

## 2023-11-10 MED ORDER — FENTANYL 100 MCG/HR TD PT72
1.0000 | MEDICATED_PATCH | TRANSDERMAL | 0 refills | Status: DC
Start: 2023-11-10 — End: 2023-12-11

## 2023-11-10 NOTE — Telephone Encounter (Signed)
 Copied from CRM [id number]. Topic: Clinical - Medication Refill >> Nov 10, 2023 11:10 AM Ivette P wrote: Most Recent Primary Care Visit:  Provider: Debera Lat  Department: [facility]  Visit Type: HOSPITAL FU  Date: 11/07/2023  Medication: fentaNYL (DURAGESIC) 100 MCG/HR  Has the patient contacted their pharmacy? Yes (Agent: If no, request that the patient contact the pharmacy for the refill. If patient does not wish to contact the pharmacy document the reason why and proceed with request.) (Agent: If yes, when and what did the pharmacy advise?)  Is this the correct pharmacy for this prescription? Yes If no, delete pharmacy and type the correct one.  This is the patient's preferred pharmacy:  [id number] [place], [place] - [address] [place] [address] Phone: [phone] Fax: [phone]   Has the prescription been filled recently? Yes, last fill 10/03/23  Is the patient out of the medication? Yes, 2 patches left  Has the patient been seen for an appointment in the last year OR does the patient have an upcoming appointment? Yes  Can we respond through MyChart? No  Agent: Please be advised that Rx refills may take up to 3 business days. We ask that you follow-up with your pharmacy.

## 2023-11-10 NOTE — Telephone Encounter (Signed)
 Requested medication (s) are due for refill today - yes  Requested medication (s) are on the active medication list -yes  Future visit scheduled -yes  Last refill: 10/03/23 #10  Notes to clinic: non delegated Rx  Requested Prescriptions  Pending Prescriptions Disp Refills   fentaNYL (DURAGESIC) 100 MCG/HR 10 patch 0    Sig: Place 1 patch onto the skin every 3 (three) days.     Not Delegated - Analgesics:  Opioid Agonists Failed - 11/10/2023 12:33 PM      Failed - This refill cannot be delegated      Passed - Urine Drug Screen completed in last 360 days      Passed - Valid encounter within last 3 months    Recent Outpatient Visits           3 days ago Head injury with loss of consciousness [facility])   [place] [facility] [place], [place], PA-C       Future Appointments             In 1 month Fisher, Demetrios Isaacs, MD [place] [facility], [facility]               Requested Prescriptions  Pending Prescriptions Disp Refills   fentaNYL (DURAGESIC) 100 MCG/HR 10 patch 0    Sig: Place 1 patch onto the skin every 3 (three) days.     Not Delegated - Analgesics:  Opioid Agonists Failed - 11/10/2023 12:33 PM      Failed - This refill cannot be delegated      Passed - Urine Drug Screen completed in last 360 days      Passed - Valid encounter within last 3 months    Recent Outpatient Visits           3 days ago Head injury with loss of consciousness [facility])    [facility] [place], [place], PA-C       Future Appointments             In 1 month Fisher, Demetrios Isaacs, MD [facility], [facility]

## 2023-11-10 NOTE — Telephone Encounter (Signed)
   Notes to clinic: Duplicate request- see previous , non delegated Rx  Requested Prescriptions  Pending Prescriptions Disp Refills   fentaNYL (DURAGESIC) 100 MCG/HR 10 patch 0    Sig: Place 1 patch onto the skin every 3 (three) days.     Not Delegated - Analgesics:  Opioid Agonists Failed - 11/10/2023 12:34 PM      Failed - This refill cannot be delegated      Passed - Urine Drug Screen completed in last 360 days      Passed - Valid encounter within last 3 months    Recent Outpatient Visits           3 days ago Head injury with loss of consciousness [facility])   [place] [facility] [place], [place], PA-C       Future Appointments             In 1 month Fisher, Demetrios Isaacs, MD [place] [facility], [facility]               Requested Prescriptions  Pending Prescriptions Disp Refills   fentaNYL (DURAGESIC) 100 MCG/HR 10 patch 0    Sig: Place 1 patch onto the skin every 3 (three) days.     Not Delegated - Analgesics:  Opioid Agonists Failed - 11/10/2023 12:34 PM      Failed - This refill cannot be delegated      Passed - Urine Drug Screen completed in last 360 days      Passed - Valid encounter within last 3 months    Recent Outpatient Visits           3 days ago Head injury with loss of consciousness [facility])   [place] [facility] [place], [place], PA-C       Future Appointments             In 1 month Fisher, Demetrios Isaacs, MD [facility] [place], [facility]

## 2023-11-24 DIAGNOSIS — S0990XA Unspecified injury of head, initial encounter: Secondary | ICD-10-CM | POA: Diagnosis not present

## 2023-11-24 DIAGNOSIS — Z79899 Other long term (current) drug therapy: Secondary | ICD-10-CM | POA: Diagnosis not present

## 2023-11-24 DIAGNOSIS — Z8614 Personal history of Methicillin resistant Staphylococcus aureus infection: Secondary | ICD-10-CM | POA: Diagnosis not present

## 2023-11-24 DIAGNOSIS — Z885 Allergy status to narcotic agent status: Secondary | ICD-10-CM | POA: Diagnosis not present

## 2023-11-24 DIAGNOSIS — Z881 Allergy status to other antibiotic agents status: Secondary | ICD-10-CM | POA: Diagnosis not present

## 2023-11-24 DIAGNOSIS — J4530 Mild persistent asthma, uncomplicated: Secondary | ICD-10-CM | POA: Diagnosis not present

## 2023-11-24 DIAGNOSIS — I341 Nonrheumatic mitral (valve) prolapse: Secondary | ICD-10-CM | POA: Diagnosis not present

## 2023-11-24 DIAGNOSIS — G44229 Chronic tension-type headache, not intractable: Secondary | ICD-10-CM | POA: Diagnosis not present

## 2023-11-24 DIAGNOSIS — G4731 Primary central sleep apnea: Secondary | ICD-10-CM | POA: Diagnosis not present

## 2023-11-24 DIAGNOSIS — Q796 Ehlers-Danlos syndrome, unspecified: Secondary | ICD-10-CM | POA: Diagnosis not present

## 2023-11-24 DIAGNOSIS — J45909 Unspecified asthma, uncomplicated: Secondary | ICD-10-CM | POA: Diagnosis not present

## 2023-11-24 DIAGNOSIS — M1990 Unspecified osteoarthritis, unspecified site: Secondary | ICD-10-CM | POA: Diagnosis not present

## 2023-11-24 DIAGNOSIS — Z8673 Personal history of transient ischemic attack (TIA), and cerebral infarction without residual deficits: Secondary | ICD-10-CM | POA: Diagnosis not present

## 2023-11-24 DIAGNOSIS — R55 Syncope and collapse: Secondary | ICD-10-CM | POA: Diagnosis not present

## 2023-11-24 DIAGNOSIS — M797 Fibromyalgia: Secondary | ICD-10-CM | POA: Diagnosis not present

## 2023-11-24 DIAGNOSIS — F333 Major depressive disorder, recurrent, severe with psychotic symptoms: Secondary | ICD-10-CM | POA: Diagnosis not present

## 2023-11-24 DIAGNOSIS — Z880 Allergy status to penicillin: Secondary | ICD-10-CM | POA: Diagnosis not present

## 2023-11-24 DIAGNOSIS — G40909 Epilepsy, unspecified, not intractable, without status epilepticus: Secondary | ICD-10-CM | POA: Diagnosis not present

## 2023-11-26 ENCOUNTER — Other Ambulatory Visit: Payer: Self-pay | Admitting: Physician Assistant

## 2023-11-26 DIAGNOSIS — J45901 Unspecified asthma with (acute) exacerbation: Secondary | ICD-10-CM

## 2023-11-27 NOTE — Telephone Encounter (Signed)
 Requested medication (s) are due for refill today: Yes  Requested medication (s) are on the active medication list: No  Last refill:  09/12/23  Future visit scheduled: Yes  Notes to clinic:  Rx not on current medication list      Requested Prescriptions  Pending Prescriptions Disp Refills   WIXELA INHUB 250-50 MCG/ACT AEPB [Pharmacy Med Name: WIXELA 250-50 INHUB] 60 each 1    Sig: INHALE 1 PUFF INTO THE LUNGS IN THE MORNING AND AT BEDTIME.     Pulmonology:  Combination Products Passed - 11/27/2023  4:09 PM      Passed - Valid encounter within last 12 months    Recent Outpatient Visits           2 weeks ago Head injury with loss of consciousness [facility])   [place] [facility] [place], [place], [place]       Future Appointments             In 2 weeks Shann Darnel, Erlinda Haws, MD [facility], [facility]

## 2023-11-29 DIAGNOSIS — F333 Major depressive disorder, recurrent, severe with psychotic symptoms: Secondary | ICD-10-CM | POA: Diagnosis not present

## 2023-11-29 DIAGNOSIS — Z885 Allergy status to narcotic agent status: Secondary | ICD-10-CM | POA: Diagnosis not present

## 2023-11-29 DIAGNOSIS — G44229 Chronic tension-type headache, not intractable: Secondary | ICD-10-CM | POA: Diagnosis not present

## 2023-11-29 DIAGNOSIS — M797 Fibromyalgia: Secondary | ICD-10-CM | POA: Diagnosis not present

## 2023-11-29 DIAGNOSIS — G4731 Primary central sleep apnea: Secondary | ICD-10-CM | POA: Diagnosis not present

## 2023-11-29 DIAGNOSIS — M1990 Unspecified osteoarthritis, unspecified site: Secondary | ICD-10-CM | POA: Diagnosis not present

## 2023-11-29 DIAGNOSIS — Z881 Allergy status to other antibiotic agents status: Secondary | ICD-10-CM | POA: Diagnosis not present

## 2023-11-29 DIAGNOSIS — Z880 Allergy status to penicillin: Secondary | ICD-10-CM | POA: Diagnosis not present

## 2023-11-29 DIAGNOSIS — I341 Nonrheumatic mitral (valve) prolapse: Secondary | ICD-10-CM | POA: Diagnosis not present

## 2023-11-29 DIAGNOSIS — G40909 Epilepsy, unspecified, not intractable, without status epilepticus: Secondary | ICD-10-CM | POA: Diagnosis not present

## 2023-11-29 DIAGNOSIS — Z886 Allergy status to analgesic agent status: Secondary | ICD-10-CM | POA: Diagnosis not present

## 2023-11-29 DIAGNOSIS — H9190 Unspecified hearing loss, unspecified ear: Secondary | ICD-10-CM | POA: Diagnosis not present

## 2023-11-29 DIAGNOSIS — Q796 Ehlers-Danlos syndrome, unspecified: Secondary | ICD-10-CM | POA: Diagnosis not present

## 2023-12-06 DIAGNOSIS — Z881 Allergy status to other antibiotic agents status: Secondary | ICD-10-CM | POA: Diagnosis not present

## 2023-12-06 DIAGNOSIS — G40909 Epilepsy, unspecified, not intractable, without status epilepticus: Secondary | ICD-10-CM | POA: Diagnosis not present

## 2023-12-06 DIAGNOSIS — F333 Major depressive disorder, recurrent, severe with psychotic symptoms: Secondary | ICD-10-CM | POA: Diagnosis not present

## 2023-12-06 DIAGNOSIS — Z79899 Other long term (current) drug therapy: Secondary | ICD-10-CM | POA: Diagnosis not present

## 2023-12-06 DIAGNOSIS — Z885 Allergy status to narcotic agent status: Secondary | ICD-10-CM | POA: Diagnosis not present

## 2023-12-06 DIAGNOSIS — Z880 Allergy status to penicillin: Secondary | ICD-10-CM | POA: Diagnosis not present

## 2023-12-11 ENCOUNTER — Ambulatory Visit (INDEPENDENT_AMBULATORY_CARE_PROVIDER_SITE_OTHER): Admitting: Family Medicine

## 2023-12-11 ENCOUNTER — Encounter: Payer: Self-pay | Admitting: Family Medicine

## 2023-12-11 VITALS — BP 116/84 | HR 84 | Temp 98.3°F | Resp 16 | Ht 59.5 in | Wt 127.0 lb

## 2023-12-11 DIAGNOSIS — Q796 Ehlers-Danlos syndrome, unspecified: Secondary | ICD-10-CM | POA: Diagnosis not present

## 2023-12-11 DIAGNOSIS — F29 Unspecified psychosis not due to a substance or known physiological condition: Secondary | ICD-10-CM | POA: Diagnosis not present

## 2023-12-11 DIAGNOSIS — M5136 Other intervertebral disc degeneration, lumbar region with discogenic back pain only: Secondary | ICD-10-CM

## 2023-12-11 DIAGNOSIS — M797 Fibromyalgia: Secondary | ICD-10-CM

## 2023-12-11 DIAGNOSIS — F333 Major depressive disorder, recurrent, severe with psychotic symptoms: Secondary | ICD-10-CM | POA: Diagnosis not present

## 2023-12-11 DIAGNOSIS — M1990 Unspecified osteoarthritis, unspecified site: Secondary | ICD-10-CM | POA: Diagnosis not present

## 2023-12-11 MED ORDER — FENTANYL 100 MCG/HR TD PT72
1.0000 | MEDICATED_PATCH | TRANSDERMAL | 0 refills | Status: DC
Start: 2023-12-11 — End: 2024-01-05

## 2023-12-11 MED ORDER — QUETIAPINE FUMARATE 200 MG PO TABS: 200.0000 mg | ORAL_TABLET | Freq: Every day | ORAL | 1 refills | Status: DC

## 2023-12-11 NOTE — Patient Instructions (Signed)
 Karen [name]  Please review the attached list of medications and notify my office if there are any errors.   . Please bring all of your medications to every appointment so we can make sure that our medication list is the same as yours.

## 2023-12-12 DIAGNOSIS — G2569 Other tics of organic origin: Secondary | ICD-10-CM | POA: Diagnosis not present

## 2023-12-12 DIAGNOSIS — F331 Major depressive disorder, recurrent, moderate: Secondary | ICD-10-CM | POA: Diagnosis not present

## 2023-12-12 DIAGNOSIS — F411 Generalized anxiety disorder: Secondary | ICD-10-CM | POA: Diagnosis not present

## 2023-12-13 DIAGNOSIS — F333 Major depressive disorder, recurrent, severe with psychotic symptoms: Secondary | ICD-10-CM | POA: Diagnosis not present

## 2023-12-13 DIAGNOSIS — Z881 Allergy status to other antibiotic agents status: Secondary | ICD-10-CM | POA: Diagnosis not present

## 2023-12-13 DIAGNOSIS — Q796 Ehlers-Danlos syndrome, unspecified: Secondary | ICD-10-CM | POA: Diagnosis not present

## 2023-12-13 DIAGNOSIS — Z885 Allergy status to narcotic agent status: Secondary | ICD-10-CM | POA: Diagnosis not present

## 2023-12-13 DIAGNOSIS — G43009 Migraine without aura, not intractable, without status migrainosus: Secondary | ICD-10-CM | POA: Diagnosis not present

## 2023-12-13 DIAGNOSIS — H9190 Unspecified hearing loss, unspecified ear: Secondary | ICD-10-CM | POA: Diagnosis not present

## 2023-12-13 DIAGNOSIS — G8250 Quadriplegia, unspecified: Secondary | ICD-10-CM | POA: Diagnosis not present

## 2023-12-13 DIAGNOSIS — G709 Myoneural disorder, unspecified: Secondary | ICD-10-CM | POA: Diagnosis not present

## 2023-12-13 DIAGNOSIS — Z880 Allergy status to penicillin: Secondary | ICD-10-CM | POA: Diagnosis not present

## 2023-12-13 DIAGNOSIS — Z9989 Dependence on other enabling machines and devices: Secondary | ICD-10-CM | POA: Diagnosis not present

## 2023-12-13 DIAGNOSIS — Z886 Allergy status to analgesic agent status: Secondary | ICD-10-CM | POA: Diagnosis not present

## 2023-12-13 DIAGNOSIS — M1990 Unspecified osteoarthritis, unspecified site: Secondary | ICD-10-CM | POA: Diagnosis not present

## 2023-12-13 DIAGNOSIS — Z79899 Other long term (current) drug therapy: Secondary | ICD-10-CM | POA: Diagnosis not present

## 2023-12-13 DIAGNOSIS — M797 Fibromyalgia: Secondary | ICD-10-CM | POA: Diagnosis not present

## 2023-12-13 DIAGNOSIS — I341 Nonrheumatic mitral (valve) prolapse: Secondary | ICD-10-CM | POA: Diagnosis not present

## 2023-12-13 DIAGNOSIS — Z7951 Long term (current) use of inhaled steroids: Secondary | ICD-10-CM | POA: Diagnosis not present

## 2023-12-13 DIAGNOSIS — Z87820 Personal history of traumatic brain injury: Secondary | ICD-10-CM | POA: Diagnosis not present

## 2023-12-13 DIAGNOSIS — G40909 Epilepsy, unspecified, not intractable, without status epilepticus: Secondary | ICD-10-CM | POA: Diagnosis not present

## 2023-12-13 DIAGNOSIS — G4739 Other sleep apnea: Secondary | ICD-10-CM | POA: Diagnosis not present

## 2023-12-13 DIAGNOSIS — Z8614 Personal history of Methicillin resistant Staphylococcus aureus infection: Secondary | ICD-10-CM | POA: Diagnosis not present

## 2023-12-13 DIAGNOSIS — J4530 Mild persistent asthma, uncomplicated: Secondary | ICD-10-CM | POA: Diagnosis not present

## 2023-12-13 DIAGNOSIS — G44229 Chronic tension-type headache, not intractable: Secondary | ICD-10-CM | POA: Diagnosis not present

## 2023-12-13 DIAGNOSIS — K219 Gastro-esophageal reflux disease without esophagitis: Secondary | ICD-10-CM | POA: Diagnosis not present

## 2023-12-13 DIAGNOSIS — Z791 Long term (current) use of non-steroidal anti-inflammatories (NSAID): Secondary | ICD-10-CM | POA: Diagnosis not present

## 2023-12-18 ENCOUNTER — Telehealth: Payer: Self-pay | Admitting: Family Medicine

## 2023-12-18 DIAGNOSIS — F32A Depression, unspecified: Secondary | ICD-10-CM

## 2023-12-19 NOTE — Telephone Encounter (Signed)
 Called patient no answer. Mailed box is full.

## 2023-12-19 NOTE — Telephone Encounter (Signed)
 Patient has been taking a 15mg  and a 10mg  tablet twice a day. Please let her know that I sent a prescription for the 30mg  tablet to take twice a day instead.

## 2023-12-21 NOTE — Telephone Encounter (Signed)
 Patient advised.

## 2023-12-25 ENCOUNTER — Other Ambulatory Visit: Payer: Self-pay | Admitting: Family Medicine

## 2023-12-27 DIAGNOSIS — Z886 Allergy status to analgesic agent status: Secondary | ICD-10-CM | POA: Diagnosis not present

## 2023-12-27 DIAGNOSIS — Z880 Allergy status to penicillin: Secondary | ICD-10-CM | POA: Diagnosis not present

## 2023-12-27 DIAGNOSIS — Z91018 Allergy to other foods: Secondary | ICD-10-CM | POA: Diagnosis not present

## 2023-12-27 DIAGNOSIS — Z79899 Other long term (current) drug therapy: Secondary | ICD-10-CM | POA: Diagnosis not present

## 2023-12-27 DIAGNOSIS — G4739 Other sleep apnea: Secondary | ICD-10-CM | POA: Diagnosis not present

## 2023-12-27 DIAGNOSIS — Z881 Allergy status to other antibiotic agents status: Secondary | ICD-10-CM | POA: Diagnosis not present

## 2023-12-27 DIAGNOSIS — G40909 Epilepsy, unspecified, not intractable, without status epilepticus: Secondary | ICD-10-CM | POA: Diagnosis not present

## 2023-12-27 DIAGNOSIS — Z885 Allergy status to narcotic agent status: Secondary | ICD-10-CM | POA: Diagnosis not present

## 2023-12-27 DIAGNOSIS — Z9109 Other allergy status, other than to drugs and biological substances: Secondary | ICD-10-CM | POA: Diagnosis not present

## 2023-12-27 DIAGNOSIS — Q796 Ehlers-Danlos syndrome, unspecified: Secondary | ICD-10-CM | POA: Diagnosis not present

## 2023-12-27 DIAGNOSIS — Z8673 Personal history of transient ischemic attack (TIA), and cerebral infarction without residual deficits: Secondary | ICD-10-CM | POA: Diagnosis not present

## 2023-12-27 DIAGNOSIS — F333 Major depressive disorder, recurrent, severe with psychotic symptoms: Secondary | ICD-10-CM | POA: Diagnosis not present

## 2023-12-27 DIAGNOSIS — J4530 Mild persistent asthma, uncomplicated: Secondary | ICD-10-CM | POA: Diagnosis not present

## 2023-12-27 DIAGNOSIS — K219 Gastro-esophageal reflux disease without esophagitis: Secondary | ICD-10-CM | POA: Diagnosis not present

## 2023-12-27 DIAGNOSIS — G44229 Chronic tension-type headache, not intractable: Secondary | ICD-10-CM | POA: Diagnosis not present

## 2024-01-05 ENCOUNTER — Other Ambulatory Visit: Payer: Self-pay

## 2024-01-05 DIAGNOSIS — M5136 Other intervertebral disc degeneration, lumbar region with discogenic back pain only: Secondary | ICD-10-CM

## 2024-01-05 MED ORDER — FENTANYL 100 MCG/HR TD PT72: 1.0000 | MEDICATED_PATCH | TRANSDERMAL | 0 refills | Status: DC

## 2024-01-05 NOTE — Telephone Encounter (Signed)
 Copied from CRM [id number]. Topic: Clinical - Prescription Issue >> Jan 04, 2024  4:30 PM Stanly Early wrote: Reason for CRM: fentaNYL  (DURAGESIC ) 100 MCG/HR Md, Shann Darnel told her to call when she is halfway through the prescription.

## 2024-01-08 NOTE — Progress Notes (Signed)
 Established patient visit   Patient: Karen [name]   DOB: [birth year]   48 y.o. Female  MRN: [id number] Visit Date: 12/11/2023  Today's healthcare provider: Jeralene Mom, MD   Chief Complaint  Patient presents with   Medical Management of Chronic Issues   Subjective    Discussed the use of AI scribe software for clinical note transcription with the patient, who gave verbal consent to proceed.  History of Present Illness   Karen [name] is a 48 year old female who presents follow up of chronic back and arthritic pain and also complaining of excessive postnasal drip.  She has been experiencing significant postnasal drip recently and uses Mucinex and Flonase  nasal spray to alleviate the symptoms. No side effects from Mucinex are reported. She experiences occasional coughing and wheezing, for which she uses an inhaler. She coughs up clear or yellow mucus, which she attributes to sinus drainage. She denies frequent use of the inhaler.  She was hospitalized in March with encephalopathy, pneumonia, and cholecystitis requiring cholecystectomy.  She has history of psychosis and major depression and continues ECT treatments at [facility].     She continues on several psychiatric medications including Seroquel  for which states the dosage has fluctuated between 100 mg and 200 mg, but she is currently taking 200 mg, which helps with sleep and managing her psychosis. She also reports that she was started on Abilify 10 mg about a month ago by psychiatry, which works well for her auditory hallucinations.       Medications: Outpatient Medications Prior to Visit  Medication Sig   albuterol  (PROVENTIL ) (2.5 MG/3ML) 0.083% nebulizer solution Take 3 mLs (2.5 mg total) by nebulization every 6 (six) hours as needed for wheezing or shortness of breath.   albuterol  (VENTOLIN  HFA) 108 (90 Base) MCG/ACT inhaler Inhale 2 puffs into the lungs every 6 (six) hours as needed.   ARIPiprazole (ABILIFY) 10 MG  tablet Take 10 mg by mouth daily.   Armodafinil  150 MG tablet Take 1 tablet (150 mg total) by mouth daily.   celecoxib  (CELEBREX ) 200 MG capsule Take 1 capsule (200 mg total) by mouth 2 (two) times daily.   diclofenac  Sodium (VOLTAREN ) 1 % GEL APPLY AS NEEDED 3 TIMES A DAY   docusate sodium (COLACE) 100 MG capsule Take 200 mg by mouth daily.   DULoxetine  (CYMBALTA ) 60 MG capsule TAKE 2 CAPSULES BY MOUTH DAILY   fexofenadine  (ALLEGRA ) 180 MG tablet Take 1 tablet (180 mg total) by mouth daily.   fluticasone -salmeterol (WIXELA INHUB) 250-50 MCG/ACT AEPB INHALE 1 PUFF INTO THE LUNGS IN THE MORNING AND AT BEDTIME.   gabapentin  (NEURONTIN ) 800 MG tablet TAKE 1 TABLET BY MOUTH THREE TIMES A DAY   haloperidol  (HALDOL ) 0.5 MG tablet TAKE 1/2 OF A TABLET (0.25 MG TOTAL) BY MOUTH TWICE A DAY (Patient taking differently: TAKE 1/2 OF A TABLET (0.25 MG TOTAL) BY MOUTH TWICE A DAY)   Melatonin 10 MG TABS Take 20 mg by mouth at bedtime.   Multiple Vitamins-Minerals (HAIR SKIN & NAILS PO) Take 2 tablets by mouth daily.   omeprazole  (PRILOSEC) 40 MG capsule TAKE 1 CAPSULE BY MOUTH TWICE A DAY   ondansetron  (ZOFRAN -ODT) 4 MG disintegrating tablet DISSOLVE 1 TABLET IN MOUTH EVERY 8 HOURS FOR NAUSEA AND VOMITING   busPIRone  (BUSPAR ) 10 MG tablet TAKE 1 TABLET (10 MG TOTAL) BY MOUTH 2 (TWO) TIMES DAILY. TAKE ALONG WITH 15MG  TABLET TWICE A DAY   busPIRone  (BUSPAR ) 15 MG  tablet Take 1 tablet (15 mg total) by mouth 2 (two) times daily.   fentaNYL  (DURAGESIC ) 100 MCG/HR Place 1 patch onto the skin every 3 (three) days.   QUEtiapine  (SEROQUEL ) 100 MG tablet Take 1 tablet (100 mg total) by mouth at bedtime. (Patient taking differently: Take 100 mg by mouth at bedtime.)   acetaminophen  (TYLENOL ) 500 MG tablet Take 1,000 mg by mouth every 6 (six) hours as needed for mild pain (pain score 1-3) or moderate pain (pain score 4-6). (Patient not taking: Reported on 12/11/2023)   hydrOXYzine (VISTARIL) 25 MG capsule Take 25 mg by  mouth as needed.   methocarbamol  (ROBAXIN ) 500 MG tablet Take 1 tablet (500 mg total) by mouth every 8 (eight) hours as needed.   Multiple Vitamins-Minerals (MULTIVITAMIN WITH MINERALS) tablet Take 2 tablets by mouth daily.   naloxone (NARCAN) nasal spray 4 mg/0.1 mL Place 1 spray into the nose as needed.   No facility-administered medications prior to visit.   Review of Systems  Respiratory: Negative.  Negative for cough, shortness of breath and wheezing.   Cardiovascular:  Negative for chest pain, palpitations and leg swelling.  Neurological:  Negative for weakness and headaches.       Objective    BP 116/84 (BP Location: Left Arm, Patient Position: Sitting, Cuff Size: Normal)   Pulse 84   Temp 98.3 F (36.8 C) (Oral)   Resp 16   Ht 4' 11.5" (1.511 m)   Wt 127 lb (57.6 kg)   SpO2 97%   BMI 25.22 kg/m   Physical Exam   General: Appearance:    Well developed, well nourished female in no acute distress  Eyes:    PERRL, conjunctiva/corneas clear, EOM's intact       Heent:   Moderage post nasal drainage  Lungs:     Clear to auscultation bilaterally, respirations unlabored  Heart:    Normal heart rate. Normal rhythm. No murmurs, rubs, or gallops.    MS:   All extremities are intact.    Neurologic:   Awake, alert, oriented x 3. No apparent focal neurological defect.         Assessment & Plan        Psychotic disorder with major depression Psychotic disorder with auditory hallucinations managed with Seroquel  and Abilify. Prefers Seroquel  200 mg. Abilify 10 mg effective. Coordinated with psychiatrist for medication management. - Print Seroquel  prescription for 200 mg and advise to confirm with psychiatrist before filling. - Send fentanyl  prescription to pharmacy.  Cough with sputum production Intermittent cough with clear or yellow sputum likely due to postnasal drip. No wheezing or significant lung findings. Infrequent inhaler use.  Postnasal drip Recent postnasal drip  managed with Mucinex and Flonase . Mucinex effective without side effects. - Continue Mucinex and Flonase  for symptom management.  Chronic back pain and fibromyalgia Stable on current medication regiment.     Return in about 6 months (around 06/11/2024).     Jeralene Mom, MD  [facility] [phone] (phone) [phone] (fax)  [facility]

## 2024-01-09 ENCOUNTER — Other Ambulatory Visit (HOSPITAL_COMMUNITY): Payer: Self-pay

## 2024-01-09 ENCOUNTER — Telehealth: Payer: Self-pay

## 2024-01-09 NOTE — Telephone Encounter (Signed)
 Pharmacy Patient Advocate Encounter  Received notification from [employer] that Prior Authorization for Armodafinil  150MG  tablets has been APPROVED from 0101/25 to 01/08/25. Ran test claim, Copay is $1.60. This test claim was processed through [facility]- copay amounts may vary at other pharmacies due to pharmacy/plan contracts, or as the patient moves through the different stages of their insurance plan.   PA #/Case ID/Reference #: [id number]

## 2024-01-10 ENCOUNTER — Other Ambulatory Visit (INDEPENDENT_AMBULATORY_CARE_PROVIDER_SITE_OTHER): Payer: Self-pay | Admitting: Family

## 2024-01-10 DIAGNOSIS — G43009 Migraine without aura, not intractable, without status migrainosus: Secondary | ICD-10-CM | POA: Diagnosis not present

## 2024-01-10 DIAGNOSIS — G4739 Other sleep apnea: Secondary | ICD-10-CM | POA: Diagnosis not present

## 2024-01-10 DIAGNOSIS — G2569 Other tics of organic origin: Secondary | ICD-10-CM

## 2024-01-10 DIAGNOSIS — Z8614 Personal history of Methicillin resistant Staphylococcus aureus infection: Secondary | ICD-10-CM | POA: Diagnosis not present

## 2024-01-10 DIAGNOSIS — G44229 Chronic tension-type headache, not intractable: Secondary | ICD-10-CM | POA: Diagnosis not present

## 2024-01-10 DIAGNOSIS — M1990 Unspecified osteoarthritis, unspecified site: Secondary | ICD-10-CM | POA: Diagnosis not present

## 2024-01-10 DIAGNOSIS — G40909 Epilepsy, unspecified, not intractable, without status epilepticus: Secondary | ICD-10-CM | POA: Diagnosis not present

## 2024-01-10 DIAGNOSIS — J4530 Mild persistent asthma, uncomplicated: Secondary | ICD-10-CM | POA: Diagnosis not present

## 2024-01-10 DIAGNOSIS — I341 Nonrheumatic mitral (valve) prolapse: Secondary | ICD-10-CM | POA: Diagnosis not present

## 2024-01-10 DIAGNOSIS — Q796 Ehlers-Danlos syndrome, unspecified: Secondary | ICD-10-CM | POA: Diagnosis not present

## 2024-01-10 DIAGNOSIS — K219 Gastro-esophageal reflux disease without esophagitis: Secondary | ICD-10-CM | POA: Diagnosis not present

## 2024-01-10 DIAGNOSIS — Z79899 Other long term (current) drug therapy: Secondary | ICD-10-CM | POA: Diagnosis not present

## 2024-01-10 DIAGNOSIS — F259 Schizoaffective disorder, unspecified: Secondary | ICD-10-CM | POA: Diagnosis not present

## 2024-01-10 DIAGNOSIS — Z8673 Personal history of transient ischemic attack (TIA), and cerebral infarction without residual deficits: Secondary | ICD-10-CM | POA: Diagnosis not present

## 2024-01-10 DIAGNOSIS — I38 Endocarditis, valve unspecified: Secondary | ICD-10-CM | POA: Diagnosis not present

## 2024-01-11 ENCOUNTER — Other Ambulatory Visit: Payer: Self-pay

## 2024-01-11 DIAGNOSIS — M5136 Other intervertebral disc degeneration, lumbar region with discogenic back pain only: Secondary | ICD-10-CM

## 2024-01-11 MED ORDER — FENTANYL 100 MCG/HR TD PT72: 1.0000 | MEDICATED_PATCH | TRANSDERMAL | 0 refills | Status: DC

## 2024-01-16 DIAGNOSIS — F331 Major depressive disorder, recurrent, moderate: Secondary | ICD-10-CM | POA: Diagnosis not present

## 2024-01-16 DIAGNOSIS — Z1389 Encounter for screening for other disorder: Secondary | ICD-10-CM | POA: Diagnosis not present

## 2024-01-16 DIAGNOSIS — F411 Generalized anxiety disorder: Secondary | ICD-10-CM | POA: Diagnosis not present

## 2024-01-16 DIAGNOSIS — G2569 Other tics of organic origin: Secondary | ICD-10-CM | POA: Diagnosis not present

## 2024-01-26 DIAGNOSIS — F259 Schizoaffective disorder, unspecified: Secondary | ICD-10-CM | POA: Diagnosis not present

## 2024-01-29 ENCOUNTER — Other Ambulatory Visit: Payer: Self-pay | Admitting: Family Medicine

## 2024-01-30 ENCOUNTER — Telehealth: Payer: Self-pay

## 2024-01-30 NOTE — Telephone Encounter (Signed)
 Copied from CRM [id number]. Topic: Clinical - Medication Prior Auth >> Jan 09, 2024 10:13 AM Chasity T wrote: Reason for CRM: Patient is calling in requesting for insurance approval form to be done for Armodafinil  150 MG tablet.

## 2024-02-01 ENCOUNTER — Other Ambulatory Visit: Payer: Self-pay | Admitting: Family Medicine

## 2024-02-01 DIAGNOSIS — F32A Depression, unspecified: Secondary | ICD-10-CM

## 2024-02-01 NOTE — Telephone Encounter (Signed)
 Copied from CRM [id number]. Topic: Appointments - Scheduling Inquiry for Clinic >> Jan 12, 2024  3:08 PM Adaline Holly wrote: 02/01/2024   AWV scheduled with patient  Patient called to reschedule her AWV. No appointments showing available on schedule. Please follow up with patient

## 2024-02-02 ENCOUNTER — Other Ambulatory Visit (HOSPITAL_COMMUNITY): Payer: Self-pay

## 2024-02-05 ENCOUNTER — Other Ambulatory Visit: Payer: Self-pay | Admitting: Family Medicine

## 2024-02-05 DIAGNOSIS — G4710 Hypersomnia, unspecified: Secondary | ICD-10-CM

## 2024-02-05 DIAGNOSIS — M5136 Other intervertebral disc degeneration, lumbar region with discogenic back pain only: Secondary | ICD-10-CM

## 2024-02-05 NOTE — Telephone Encounter (Unsigned)
 Copied from CRM [id number]. Topic: Clinical - Medication Refill >> Feb 05, 2024  2:44 PM Nathanel BROCKS wrote: Medication: fentaNYL  (DURAGESIC ) 100 MCG/HR   Has the patient contacted their pharmacy? Yes   This is the patient's preferred pharmacy:  [id number] [place] [place] - [address] [place] [address] Phone: [phone] Fax: [phone]  Is this the correct pharmacy for this prescription? Yes If no, delete pharmacy and type the correct one.   Has the prescription been filled recently? Yes  Is the patient out of the medication? Yes  Has the patient been seen for an appointment in the last year OR does the patient have an upcoming appointment? Yes  Can we respond through MyChart? No  Agent: Please be advised that Rx refills may take up to 3 business days. We ask that you follow-up with your pharmacy.   ----------------------------------------------------------------------- From previous Reason for Contact - Prescription Issue: Reason for CRM:

## 2024-02-05 NOTE — Telephone Encounter (Unsigned)
 Copied from CRM [id number]. Topic: Clinical - Medication Refill >> Feb 05, 2024  2:46 PM Nathanel BROCKS wrote: Medication: Armodafinil  150 MG tablet   Has the patient contacted their pharmacy? Yes   This is the patient's preferred pharmacy:  [id number] [place], [place] - [address] [address] [address] Phone: [phone] Fax: [phone]  Is this the correct pharmacy for this prescription? Yes If no, delete pharmacy and type the correct one.   Has the prescription been filled recently? Yes  Is the patient out of the medication? Yes  Has the patient been seen for an appointment in the last year OR does the patient have an upcoming appointment? Yes  Can we respond through MyChart? No  Agent: Please be advised that Rx refills may take up to 3 business days. We ask that you follow-up with your pharmacy.

## 2024-02-07 MED ORDER — ARMODAFINIL 150 MG PO TABS: 150.0000 mg | ORAL_TABLET | Freq: Every day | ORAL | 3 refills | Status: DC

## 2024-02-07 MED ORDER — FENTANYL 100 MCG/HR TD PT72: 1.0000 | MEDICATED_PATCH | TRANSDERMAL | 0 refills | Status: DC

## 2024-02-07 NOTE — Telephone Encounter (Signed)
 Requested medication (s) are due for refill today: Yes  Requested medication (s) are on the active medication list: Yes  Last refill:  10/03/23  Future visit scheduled: Yes  Notes to clinic:  Unable to refill per protocol, cannot delegate.      Requested Prescriptions  Pending Prescriptions Disp Refills   Armodafinil  150 MG tablet 30 tablet 3    Sig: Take 1 tablet (150 mg total) by mouth daily.     Not Delegated - Psychiatry:  Stimulants/ADHD Failed - 02/07/2024 10:49 AM      Failed - This refill cannot be delegated      Passed - Urine Drug Screen completed in last 360 days      Passed - Last BP in normal range    BP Readings from Last 1 Encounters:  12/11/23 116/84         Passed - Last Heart Rate in normal range    Pulse Readings from Last 1 Encounters:  12/11/23 84         Passed - Valid encounter within last 6 months    Recent Outpatient Visits           1 month ago Arthritis   [facility] [place] Nancyann BRAVO, MD   3 months ago Head injury with loss of consciousness [facility])   [facility] [place], Janna, PA-C

## 2024-02-07 NOTE — Telephone Encounter (Signed)
 Requested medication (s) are due for refill today: na   Requested medication (s) are on the active medication list: yes   Last refill:  01/11/24 #10 0 refills  Future visit scheduled: yes 06/10/24  Notes to clinic:  not delegated per protocol. Do you want to refill Rx?     Requested Prescriptions  Pending Prescriptions Disp Refills   fentaNYL  (DURAGESIC ) 100 MCG/HR 10 patch 0    Sig: Place 1 patch onto the skin every 3 (three) days.     Not Delegated - Analgesics:  Opioid Agonists Failed - 02/07/2024 10:50 AM      Failed - This refill cannot be delegated      Passed - Urine Drug Screen completed in last 360 days      Passed - Valid encounter within last 3 months    Recent Outpatient Visits           1 month ago Arthritis   [facility] [place] Nancyann BRAVO, MD   3 months ago Head injury with loss of consciousness [facility])   [facility] [place], [place], PA-C

## 2024-02-09 DIAGNOSIS — G4739 Other sleep apnea: Secondary | ICD-10-CM | POA: Diagnosis not present

## 2024-02-09 DIAGNOSIS — K219 Gastro-esophageal reflux disease without esophagitis: Secondary | ICD-10-CM | POA: Diagnosis not present

## 2024-02-09 DIAGNOSIS — F333 Major depressive disorder, recurrent, severe with psychotic symptoms: Secondary | ICD-10-CM | POA: Diagnosis not present

## 2024-02-09 DIAGNOSIS — F419 Anxiety disorder, unspecified: Secondary | ICD-10-CM | POA: Diagnosis not present

## 2024-02-09 DIAGNOSIS — F329 Major depressive disorder, single episode, unspecified: Secondary | ICD-10-CM | POA: Diagnosis not present

## 2024-02-09 DIAGNOSIS — I341 Nonrheumatic mitral (valve) prolapse: Secondary | ICD-10-CM | POA: Diagnosis not present

## 2024-02-23 DIAGNOSIS — F329 Major depressive disorder, single episode, unspecified: Secondary | ICD-10-CM | POA: Diagnosis not present

## 2024-02-23 DIAGNOSIS — M797 Fibromyalgia: Secondary | ICD-10-CM | POA: Diagnosis not present

## 2024-02-23 DIAGNOSIS — F333 Major depressive disorder, recurrent, severe with psychotic symptoms: Secondary | ICD-10-CM | POA: Diagnosis not present

## 2024-02-23 DIAGNOSIS — Z87820 Personal history of traumatic brain injury: Secondary | ICD-10-CM | POA: Diagnosis not present

## 2024-02-23 DIAGNOSIS — Q796 Ehlers-Danlos syndrome, unspecified: Secondary | ICD-10-CM | POA: Diagnosis not present

## 2024-02-23 DIAGNOSIS — J4530 Mild persistent asthma, uncomplicated: Secondary | ICD-10-CM | POA: Diagnosis not present

## 2024-02-23 DIAGNOSIS — I341 Nonrheumatic mitral (valve) prolapse: Secondary | ICD-10-CM | POA: Diagnosis not present

## 2024-02-23 DIAGNOSIS — G8250 Quadriplegia, unspecified: Secondary | ICD-10-CM | POA: Diagnosis not present

## 2024-02-23 DIAGNOSIS — K219 Gastro-esophageal reflux disease without esophagitis: Secondary | ICD-10-CM | POA: Diagnosis not present

## 2024-03-05 DIAGNOSIS — F411 Generalized anxiety disorder: Secondary | ICD-10-CM | POA: Diagnosis not present

## 2024-03-05 DIAGNOSIS — F331 Major depressive disorder, recurrent, moderate: Secondary | ICD-10-CM | POA: Diagnosis not present

## 2024-03-08 ENCOUNTER — Other Ambulatory Visit: Payer: Self-pay | Admitting: Family Medicine

## 2024-03-08 DIAGNOSIS — K219 Gastro-esophageal reflux disease without esophagitis: Secondary | ICD-10-CM | POA: Diagnosis not present

## 2024-03-08 DIAGNOSIS — M797 Fibromyalgia: Secondary | ICD-10-CM | POA: Diagnosis not present

## 2024-03-08 DIAGNOSIS — Z87820 Personal history of traumatic brain injury: Secondary | ICD-10-CM | POA: Diagnosis not present

## 2024-03-08 DIAGNOSIS — G8250 Quadriplegia, unspecified: Secondary | ICD-10-CM | POA: Diagnosis not present

## 2024-03-08 DIAGNOSIS — F250 Schizoaffective disorder, bipolar type: Secondary | ICD-10-CM | POA: Diagnosis not present

## 2024-03-08 DIAGNOSIS — F329 Major depressive disorder, single episode, unspecified: Secondary | ICD-10-CM | POA: Diagnosis not present

## 2024-03-08 DIAGNOSIS — M1990 Unspecified osteoarthritis, unspecified site: Secondary | ICD-10-CM | POA: Diagnosis not present

## 2024-03-08 DIAGNOSIS — Q796 Ehlers-Danlos syndrome, unspecified: Secondary | ICD-10-CM | POA: Diagnosis not present

## 2024-03-08 DIAGNOSIS — G4733 Obstructive sleep apnea (adult) (pediatric): Secondary | ICD-10-CM | POA: Diagnosis not present

## 2024-03-08 DIAGNOSIS — I341 Nonrheumatic mitral (valve) prolapse: Secondary | ICD-10-CM | POA: Diagnosis not present

## 2024-03-08 DIAGNOSIS — F333 Major depressive disorder, recurrent, severe with psychotic symptoms: Secondary | ICD-10-CM | POA: Diagnosis not present

## 2024-03-08 DIAGNOSIS — J45909 Unspecified asthma, uncomplicated: Secondary | ICD-10-CM | POA: Diagnosis not present

## 2024-03-08 DIAGNOSIS — G4710 Hypersomnia, unspecified: Secondary | ICD-10-CM

## 2024-03-08 NOTE — Telephone Encounter (Signed)
 Copied from CRM [id number]. Topic: Clinical - Medication Refill >> Mar 08, 2024  2:20 PM Delon HERO wrote: Medication: Armodafinil  150 MG tablet [[id number]] Script was sent 02/07/24 with a print status. Can this script please be resent electronically?  Has the patient contacted their pharmacy? Yes (Agent: If no, request that the patient contact the pharmacy for the refill. If patient does not wish to contact the pharmacy document the reason why and proceed with request.) (Agent: If yes, when and what did the pharmacy advise?)  This is the patient's preferred pharmacy:  [id number] [place], [place] - [address] [place] [address] Phone: [phone] Fax: [phone]  Is this the correct pharmacy for this prescription? Yes If no, delete pharmacy and type the correct one.   Has the prescription been filled recently? Yes  Is the patient out of the medication? Yes  Has the patient been seen for an appointment in the last year OR does the patient have an upcoming appointment? Yes  Can we respond through MyChart? Yes  Agent: Please be advised that Rx refills may take up to 3 business days. We ask that you follow-up with your pharmacy.

## 2024-03-22 ENCOUNTER — Encounter: Payer: Self-pay | Admitting: Family Medicine

## 2024-03-22 ENCOUNTER — Ambulatory Visit (INDEPENDENT_AMBULATORY_CARE_PROVIDER_SITE_OTHER): Admitting: Family Medicine

## 2024-03-22 VITALS — BP 119/82 | HR 91 | Temp 98.6°F | Ht 59.0 in | Wt 128.7 lb

## 2024-03-22 DIAGNOSIS — Z1231 Encounter for screening mammogram for malignant neoplasm of breast: Secondary | ICD-10-CM

## 2024-03-22 DIAGNOSIS — R12 Heartburn: Secondary | ICD-10-CM

## 2024-03-22 DIAGNOSIS — J4531 Mild persistent asthma with (acute) exacerbation: Secondary | ICD-10-CM

## 2024-03-22 DIAGNOSIS — F329 Major depressive disorder, single episode, unspecified: Secondary | ICD-10-CM | POA: Diagnosis not present

## 2024-03-22 DIAGNOSIS — F333 Major depressive disorder, recurrent, severe with psychotic symptoms: Secondary | ICD-10-CM | POA: Diagnosis not present

## 2024-03-22 DIAGNOSIS — R21 Rash and other nonspecific skin eruption: Secondary | ICD-10-CM | POA: Diagnosis not present

## 2024-03-22 DIAGNOSIS — G43909 Migraine, unspecified, not intractable, without status migrainosus: Secondary | ICD-10-CM | POA: Diagnosis not present

## 2024-03-22 DIAGNOSIS — M1990 Unspecified osteoarthritis, unspecified site: Secondary | ICD-10-CM | POA: Diagnosis not present

## 2024-03-22 DIAGNOSIS — E041 Nontoxic single thyroid nodule: Secondary | ICD-10-CM

## 2024-03-22 DIAGNOSIS — K219 Gastro-esophageal reflux disease without esophagitis: Secondary | ICD-10-CM | POA: Diagnosis not present

## 2024-03-22 DIAGNOSIS — J45909 Unspecified asthma, uncomplicated: Secondary | ICD-10-CM | POA: Diagnosis not present

## 2024-03-22 DIAGNOSIS — G4730 Sleep apnea, unspecified: Secondary | ICD-10-CM | POA: Diagnosis not present

## 2024-03-22 MED ORDER — OMEPRAZOLE 40 MG PO CPDR: 40.0000 mg | DELAYED_RELEASE_CAPSULE | Freq: Two times a day (BID) | ORAL | 4 refills | Status: AC

## 2024-03-22 MED ORDER — ALBUTEROL SULFATE HFA 108 (90 BASE) MCG/ACT IN AERS: 2.0000 | INHALATION_SPRAY | Freq: Four times a day (QID) | RESPIRATORY_TRACT | 5 refills | Status: DC | PRN

## 2024-03-22 MED ORDER — DICLOFENAC SODIUM 1 % EX GEL: CUTANEOUS | 3 refills | Status: AC

## 2024-03-22 NOTE — Progress Notes (Signed)
 Established patient visit   Patient: Karen [name]   DOB: 1976/03/02   48 y.o. Female  MRN: [id number] Visit Date: 03/22/2024  Today's healthcare provider: Nancyann Perry, MD   Chief Complaint  Patient presents with   Medical Management of Chronic Issues    Requests referral to dermatology for white spots on face- noticed months ago and getting bigger Patient's chart shows she is taking 200 mg of seroquel  but states she is only taking 100- ok for her to take 200 mg? PA monitoring patient's psych meds wants to know if you would be able to manage these meds (Haldol , abilify) is she on max dose for cymbalta  120 mg daily? Next ECT 8/21    Subjective    Discussed the use of AI scribe software for clinical note transcription with the patient, who gave verbal consent to proceed.  History of Present Illness   Karen [name] is a 48 year old female who presents with concerns about a white spots on her face.  She has a large white spot on her face that has been present for years and is increasing in size. The spot is located across her lips and does not itch. She has a history of seeing a dermatologist for hair loss, which was successfully treated.  She is currently on a psychiatric medication regimen, including Seroquel , Cymbalta  60 mg twice a day, and Abilify 15 mg. She undergoes electroconvulsive therapy (ECT) every two weeks, with the last session on March 22, 2024, and the next scheduled for April 04, 2024.  She requests a new prescription for omeprazole  40 mg twice a day and needs refills for Voltaren  cream and her albuterol  Ventolin  inhaler.  She mentions having dense breasts with no new lumps. Her last mammogram was in September 2023.    She is also noted to have history of thyroid  nodule and is due for follow up thyroid  ultrasound in September.       Medications: Outpatient Medications Prior to Visit  Medication Sig   acetaminophen  (TYLENOL ) 500 MG tablet Take  1,000 mg by mouth every 6 (six) hours as needed for mild pain (pain score 1-3) or moderate pain (pain score 4-6).   albuterol  (PROVENTIL ) (2.5 MG/3ML) 0.083% nebulizer solution Take 3 mLs (2.5 mg total) by nebulization every 6 (six) hours as needed for wheezing or shortness of breath.   ARIPiprazole (ABILIFY) 15 MG tablet Take 15 mg by mouth daily.   Armodafinil  150 MG tablet TAKE 1 TABLET BY MOUTH EVERY DAY   busPIRone  (BUSPAR ) 30 MG tablet Take 1 tablet (30 mg total) by mouth 2 (two) times daily.   celecoxib  (CELEBREX ) 200 MG capsule Take 1 capsule (200 mg total) by mouth 2 (two) times daily.   docusate sodium (COLACE) 100 MG capsule Take 200 mg by mouth daily.   DULoxetine  (CYMBALTA ) 60 MG capsule TAKE 2 CAPSULES BY MOUTH DAILY   fentaNYL  (DURAGESIC ) 100 MCG/HR Place 1 patch onto the skin every 3 (three) days.   fexofenadine  (ALLEGRA ) 180 MG tablet Take 1 tablet (180 mg total) by mouth daily.   fluticasone -salmeterol (WIXELA INHUB) 250-50 MCG/ACT AEPB INHALE 1 PUFF INTO THE LUNGS IN THE MORNING AND AT BEDTIME.   gabapentin  (NEURONTIN ) 800 MG tablet TAKE 1 TABLET BY MOUTH THREE TIMES A DAY   haloperidol  (HALDOL ) 0.5 MG tablet TAKE 1/2 OF A TABLET (0.25 MG TOTAL) BY MOUTH TWICE A DAY (Patient taking differently: TAKE 1/2 OF A TABLET (0.25 MG TOTAL) BY  MOUTH TWICE A DAY)   hydrOXYzine (VISTARIL) 25 MG capsule Take 25 mg by mouth as needed.   Melatonin 10 MG TABS Take 20 mg by mouth at bedtime.   methocarbamol  (ROBAXIN ) 500 MG tablet Take 1 tablet (500 mg total) by mouth every 8 (eight) hours as needed.   Multiple Vitamins-Minerals (HAIR SKIN & NAILS PO) Take 2 tablets by mouth daily.   Multiple Vitamins-Minerals (MULTIVITAMIN WITH MINERALS) tablet Take 2 tablets by mouth daily.   naloxone (NARCAN) nasal spray 4 mg/0.1 mL Place 1 spray into the nose as needed.   ondansetron  (ZOFRAN -ODT) 4 MG disintegrating tablet DISSOLVE 1 TABLET IN MOUTH EVERY 8 HOURS FOR NAUSEA AND VOMITING   QUEtiapine   (SEROQUEL ) 100 MG tablet Take 100 mg by mouth at bedtime.   albuterol  (VENTOLIN  HFA) 108 (90 Base) MCG/ACT inhaler Inhale 2 puffs into the lungs every 6 (six) hours as needed.   diclofenac  Sodium (VOLTAREN ) 1 % GEL APPLY AS NEEDED 3 TIMES A DAY   omeprazole  (PRILOSEC) 40 MG capsule TAKE 1 CAPSULE BY MOUTH TWICE A DAY   No facility-administered medications prior to visit.   Review of Systems  Constitutional:  Negative for appetite change, chills, fatigue and fever.  Respiratory:  Negative for chest tightness and shortness of breath.   Cardiovascular:  Negative for chest pain and palpitations.  Gastrointestinal:  Negative for abdominal pain, nausea and vomiting.  Neurological:  Negative for dizziness and weakness.       Objective    BP 119/82 (BP Location: Right Arm, Patient Position: Sitting, Cuff Size: Normal)   Pulse 91   Temp 98.6 F (37 C) (Oral)   Ht 4' 11 (1.499 m)   Wt 128 lb 11.2 oz (58.4 kg)   LMP 03/05/2024 (Approximate)   SpO2 98%   BMI 25.99 kg/m   Physical Exam   General appearance: Well developed, well nourished female, cooperative and in no acute distress Head: Normocephalic, without obvious abnormality, atraumatic Respiratory: Respirations even and unlabored, normal respiratory rate Extremities: All extremities are intact.  Skin: Skin color, texture, turgor normal. Vague dry white patches on left check and behind right ear.  Psych: Appropriate mood and affect. Neurologic: Mental status: Alert, oriented to person, place, and time, thought content appropriate.     Assessment & Plan    1. Thyroid  nodule (Primary) Will need thyroid  ultrasound ordered in September. Patient advised.   2. Mild persistent asthma  refill - albuterol  (VENTOLIN  HFA) 108 (90 Base) MCG/ACT inhaler; Inhale 2 puffs into the lungs every 6 (six) hours as needed.  Dispense: 18 g; Refill: 5  3. Heartburn refill - omeprazole  (PRILOSEC) 40 MG capsule; Take 1 capsule (40 mg total) by  mouth 2 (two) times daily.  Dispense: 180 capsule; Refill: 4  4. Encounter for screening mammogram for malignant neoplasm of breast  - MM 3D SCREENING MAMMOGRAM BILATERAL BREAST; Future  5. Rash of face Previous patient of Dr. Hester in need of new referral.  - Ambulatory referral to Dermatology      Nancyann Perry, MD  [facility] [phone] (phone) [phone] (fax)  [facility]

## 2024-04-01 ENCOUNTER — Other Ambulatory Visit: Payer: Self-pay

## 2024-04-01 MED ORDER — QUETIAPINE FUMARATE 100 MG PO TABS: 100.0000 mg | ORAL_TABLET | Freq: Every day | ORAL | 4 refills | Status: DC

## 2024-04-01 NOTE — Telephone Encounter (Signed)
 LOV 03/22/24 NOV 06/10/24 LRF 01/08/24 90 x 0

## 2024-04-04 ENCOUNTER — Other Ambulatory Visit: Payer: Self-pay

## 2024-04-04 ENCOUNTER — Telehealth: Payer: Self-pay | Admitting: Family Medicine

## 2024-04-04 DIAGNOSIS — M5136 Other intervertebral disc degeneration, lumbar region with discogenic back pain only: Secondary | ICD-10-CM

## 2024-04-04 NOTE — Telephone Encounter (Signed)
 Copied from CRM [id number]. Topic: Clinical - Medication Refill >> Apr 04, 2024 10:20 AM Cynthia K wrote: Medication: fentaNYL  (DURAGESIC ) 100 MCG/HR  Has the patient contacted their pharmacy? Yes (Agent: If no, request that the patient contact the pharmacy for the refill. If patient does not wish to contact the pharmacy document the reason why and proceed with request.) (Agent: If yes, when and what did the pharmacy advise?) Pharmacy needs new order to fill medication   This is the patient's preferred pharmacy:  [id number] [place], [place] - [address] [place] [address] Phone: [phone] Fax: [phone]  Is this the correct pharmacy for this prescription? Yes If no, delete pharmacy and type the correct one.   Has the prescription been filled recently? No  Is the patient out of the medication? No  Has the patient been seen for an appointment in the last year OR does the patient have an upcoming appointment? Yes  Can we respond through MyChart? No  Agent: Please be advised that Rx refills may take up to 3 business days. We ask that you follow-up with your pharmacy.

## 2024-04-04 NOTE — Telephone Encounter (Signed)
Converted to refill ?

## 2024-04-08 ENCOUNTER — Other Ambulatory Visit: Payer: Self-pay | Admitting: Dermatology

## 2024-04-08 ENCOUNTER — Encounter: Payer: Self-pay | Admitting: Dermatology

## 2024-04-08 ENCOUNTER — Ambulatory Visit (INDEPENDENT_AMBULATORY_CARE_PROVIDER_SITE_OTHER): Admitting: Dermatology

## 2024-04-08 DIAGNOSIS — L80 Vitiligo: Secondary | ICD-10-CM

## 2024-04-08 MED ORDER — OPZELURA 1.5 % EX CREA: TOPICAL_CREAM | CUTANEOUS | 3 refills | Status: DC

## 2024-04-08 NOTE — Patient Instructions (Addendum)
 Start Opzelura  cream twice daily to white patches on face.     Vitiligo is a chronic autoimmune condition which causes loss of skin pigment and is commonly seen on the face and may also involve areas of trauma like hands, elbows, knees, and ankles. There is no cure and it is difficult to treat.  Treatments include topical steroids and other topical anti-inflammatory ointments/creams and topical and oral Jak inhibitors.  Sometimes narrow band UV light therapy or Xtrac laser is helpful, both of which require twice weekly treatments for at least 3-6 months.  Antioxidant vitamins, such as Vitamins A,C,E,D, Folic Acid  and B12 may be added to enhance treatment. Heliocare may also enhance treatment results.     Due to recent changes in healthcare laws, you may see results of your pathology and/or laboratory studies on MyChart before the doctors have had a chance to review them. We understand that in some cases there may be results that are confusing or concerning to you. Please understand that not all results are received at the same time and often the doctors may need to interpret multiple results in order to provide you with the best plan of care or course of treatment. Therefore, we ask that you please give us  2 business days to thoroughly review all your results before contacting the office for clarification. Should we see a critical lab result, you will be contacted sooner.   If You Need Anything After Your Visit  If you have any questions or concerns for your doctor, please call our main line at [phone] and press option 4 to reach your doctor's medical assistant. If no one answers, please leave a voicemail as directed and we will return your call as soon as possible. Messages left after 4 pm will be answered the following business day.   You may also send us  a message via MyChart. We typically respond to MyChart messages within 1-2 business days.  For prescription refills, please ask your  pharmacy to contact our office. Our fax number is [phone].  If you have an urgent issue when the clinic is closed that cannot wait until the next business day, you can page your doctor at the number below.    Please note that while we do our best to be available for urgent issues outside of office hours, we are not available 24/7.   If you have an urgent issue and are unable to reach us , you may choose to seek medical care at your doctor's office, retail clinic, urgent care center, or emergency room.  If you have a medical emergency, please immediately call 911 or go to the emergency department.  Pager Numbers  - Dr. Hester: [phone]  - Dr. Jackquline: [phone]  - Dr. Claudene: [phone]   - Dr. Raymund: [phone]  In the event of inclement weather, please call our main line at [phone] for an update on the status of any delays or closures.  Dermatology Medication Tips: Please keep the boxes that topical medications come in in order to help keep track of the instructions about where and how to use these. Pharmacies typically print the medication instructions only on the boxes and not directly on the medication tubes.   If your medication is too expensive, please contact our office at [phone] option 4 or send us  a message through MyChart.   We are unable to tell what your co-pay for medications will be in advance as this is different depending on your insurance coverage. However, we may  be able to find a substitute medication at lower cost or fill out paperwork to get insurance to cover a needed medication.   If a prior authorization is required to get your medication covered by your insurance company, please allow us  1-2 business days to complete this process.  Drug prices often vary depending on where the prescription is filled and some pharmacies may offer cheaper prices.  The website www.goodrx.com contains coupons for medications through different  pharmacies. The prices here do not account for what the cost may be with help from insurance (it may be cheaper with your insurance), but the website can give you the price if you did not use any insurance.  - You can print the associated coupon and take it with your prescription to the pharmacy.  - You may also stop by our office during regular business hours and pick up a GoodRx coupon card.  - If you need your prescription sent electronically to a different pharmacy, notify our office through [facility] or by phone at [phone] option 4.     Si Usted Necesita Algo Despus de Su Visita  Tambin puede enviarnos un mensaje a travs de Clinical cytogeneticist. Por lo general respondemos a los mensajes de MyChart en el transcurso de 1 a 2 das hbiles.  Para renovar recetas, por favor pida a su farmacia que se ponga en contacto con nuestra oficina. Randi lakes de fax es [place] [phone].  Si tiene un asunto urgente cuando la clnica est cerrada y que no puede esperar hasta el siguiente da hbil, puede llamar/localizar a su doctor(a) al nmero que aparece a continuacin.   Por favor, tenga en cuenta que aunque hacemos todo lo posible para estar disponibles para asuntos urgentes fuera del horario de [place], no estamos disponibles las 24 horas del da, los 7 [address]  [address] de la [place].   Si tiene un problema urgente y no puede comunicarse con nosotros, puede optar por buscar atencin mdica  en el consultorio de su doctor(a), en una clnica privada, en un centro de atencin urgente o en una sala de emergencias.  Si tiene Engineer, drilling, por favor llame inmediatamente al 911 o vaya a la sala de emergencias.  Nmeros de bper  - Dr. Hester: [phone]  - Dra. Jackquline: [phone]  - Dr. Claudene: [phone]  - Dra. Kitts: [phone]  En caso de inclemencias del [place], por favor llame a nuestra lnea principal al [phone] para una actualizacin sobre el estado de cualquier retraso o  cierre.  Consejos para la medicacin en dermatologa: Por favor, guarde las cajas en las que vienen los medicamentos de uso tpico para ayudarle a seguir las instrucciones sobre dnde y cmo usarlos. Las farmacias generalmente imprimen las instrucciones del medicamento slo en las cajas y no directamente en los tubos del [place].   Si su medicamento es muy caro, por favor, pngase en contacto con landry rieger llamando al [phone] y presione la opcin 4 o envenos un mensaje a travs de Clinical cytogeneticist.   No podemos decirle cul ser su copago por los medicamentos por adelantado ya que esto es diferente dependiendo de la cobertura de su seguro. Sin embargo, es posible que podamos encontrar un medicamento sustituto a Audiological scientist un formulario para que el seguro cubra el medicamento que se considera necesario.   Si se requiere una autorizacin previa para que su compaa de seguros malta su medicamento, por favor permtanos de 1 a 2 das hbiles para completar este proceso.  Los  precios de los medicamentos varan con frecuencia dependiendo del lugar de dnde se surte la receta y alguna farmacias pueden ofrecer precios ms baratos.  El sitio web www.goodrx.com tiene cupones para medicamentos de Health and safety inspector. Los precios aqu no tienen en cuenta lo que podra costar con la ayuda del seguro (puede ser ms barato con su seguro), pero el sitio web puede darle el precio si no utiliz Tourist information centre manager.  - Puede imprimir el cupn correspondiente y llevarlo con su receta a la farmacia.  - Tambin puede pasar por nuestra oficina durante el horario de atencin regular y Education officer, museum una tarjeta de cupones de GoodRx.  - Si necesita que su receta se enve electrnicamente a una farmacia diferente, informe a nuestra oficina a travs de MyChart de [place] o por telfono llamando al [phone] y presione la opcin 4.

## 2024-04-08 NOTE — Progress Notes (Signed)
   Follow Up Visit   Subjective  Karen [name] is a 48 y.o. female who presents for the following: check white spots on face, getting larger recently. Dur: years. Asymptomatic  +Ehlers-Danlos syndrome (EDS)    The following portions of the chart were reviewed this encounter and updated as appropriate: medications, allergies, medical history  Review of Systems:  No other skin or systemic complaints except as noted in HPI or Assessment and Plan.  Objective  Well appearing patient in no apparent distress; mood and affect are within normal limits.  A focused examination was performed of the following areas: Face   Relevant exam findings are noted in the Assessment and Plan.           Assessment & Plan   VITILIGO    VITILIGO Exam: depigmented patches on L upper cutaneous lip, adjacent to upper vermilion border, R jaw line, L mid cheek  Vitiligo is a chronic autoimmune condition which causes loss of skin pigment and is commonly seen on the face and may also involve areas of trauma like hands, elbows, knees, and ankles. There is no cure and it is difficult to treat.  Treatments include topical steroids and other topical anti-inflammatory ointments/creams and topical and oral Jak inhibitors.  Sometimes narrow band UV light therapy or Xtrac laser is helpful, both of which require twice weekly treatments for at least 3-6 months.  Antioxidant vitamins, such as Vitamins A,C,E,D, Folic Acid  and B12 may be added to enhance treatment. Heliocare may also enhance treatment results.  Treatment Plan: Start Opzelura  cream twice daily to white patches on face. Do not get if too expensive  Sample given. Lot: [id number]. Exp: 04/14/2025    Return in about 6 months (around 10/09/2024) for Vitiligo Follow Up.  I, Kate Fought, CMA, am acting as scribe for Boneta Sharps, MD.   Documentation: I have reviewed the above documentation for accuracy and completeness, and I agree with the  above.  Boneta Sharps, MD

## 2024-04-09 MED ORDER — FENTANYL 100 MCG/HR TD PT72: 1.0000 | MEDICATED_PATCH | TRANSDERMAL | 0 refills | Status: DC

## 2024-04-12 ENCOUNTER — Telehealth: Payer: Self-pay

## 2024-04-12 DIAGNOSIS — E041 Nontoxic single thyroid nodule: Secondary | ICD-10-CM

## 2024-04-12 NOTE — Telephone Encounter (Signed)
 Copied from CRM #[id number]. Topic: General - Other >> Apr 12, 2024 10:50 AM Travis F wrote: Reason for CRM: Patient is calling in because she needs an ultrasound and a mammogram. Patient says she had a card from Dr. Gasper with information on it regarding an ultrasound but she lost it. She is also requesting information on getting a mammogram.

## 2024-04-12 NOTE — Telephone Encounter (Signed)
 Sh is due for thyroid  ultrasound September to follow up on thyroid  nodule. Order has been placed. She needs to call [place] about mammogram.

## 2024-04-13 DIAGNOSIS — Z23 Encounter for immunization: Secondary | ICD-10-CM | POA: Diagnosis not present

## 2024-04-15 ENCOUNTER — Encounter: Payer: Self-pay | Admitting: Dermatology

## 2024-04-16 NOTE — Telephone Encounter (Signed)
 Patient advised.

## 2024-04-18 ENCOUNTER — Ambulatory Visit

## 2024-04-18 DIAGNOSIS — F329 Major depressive disorder, single episode, unspecified: Secondary | ICD-10-CM | POA: Diagnosis not present

## 2024-04-19 ENCOUNTER — Ambulatory Visit

## 2024-04-19 VITALS — BP 119/82 | Ht 59.0 in | Wt 128.7 lb

## 2024-04-19 DIAGNOSIS — Z0000 Encounter for general adult medical examination without abnormal findings: Secondary | ICD-10-CM

## 2024-04-19 NOTE — Progress Notes (Signed)
 Because this visit was a virtual/telehealth visit,  certain criteria was not obtained, such a blood pressure, CBG if applicable, and timed get up and go. Any medications not marked as taking were not mentioned during the medication reconciliation part of the visit. Any vitals not documented were not able to be obtained due to this being a telehealth visit or patient was unable to self-report a recent blood pressure reading due to a lack of equipment at home via telehealth. Vitals that have been documented are verbally provided by the patient.   This visit was performed by a medical professional under my direct supervision. I was immediately available for consultation/collaboration. I have reviewed and agree with the Annual Wellness Visit documentation.  Subjective:   Karen [name] is a 48 y.o. who presents for a Medicare Wellness preventive visit.  As a reminder, Annual Wellness Visits don't include a physical exam, and some assessments may be limited, especially if this visit is performed virtually. We may recommend an in-person follow-up visit with your provider if needed.  Visit Complete: Virtual I connected with  Karen [name] on 04/19/24 by a audio enabled telemedicine application and verified that I am speaking with the correct person using two identifiers.  Patient Location: Home  Provider Location: Home Office  I discussed the limitations of evaluation and management by telemedicine. The patient expressed understanding and agreed to proceed.  Vital Signs: Because this visit was a virtual/telehealth visit, some criteria may be missing or patient reported. Any vitals not documented were not able to be obtained and vitals that have been documented are patient reported.  VideoDeclined- This patient declined Librarian, academic. Therefore the visit was completed with audio only.  Persons Participating in Visit: Patient.  AWV Questionnaire: No: Patient  Medicare AWV questionnaire was not completed prior to this visit.  Cardiac Risk Factors include: advanced age (>90men, >32 women)     Objective:    Today's Vitals   04/19/24 1110  BP: 119/82  Weight: 128 lb 11.2 oz (58.4 kg)  Height: 4' 11 (1.499 m)   Body mass index is 25.99 kg/m.     04/19/2024   11:10 AM 09/29/2023   12:30 AM 09/06/2023    7:29 PM 04/11/2023    5:12 PM 01/03/2023    3:35 PM 01/11/2022   10:32 AM 12/01/2021    2:52 PM  Advanced Directives  Does Patient Have a Medical Advance Directive? No No No No No No No  Would patient like information on creating a medical advance directive? No - Patient declined      No - Patient declined    Current Medications (verified) Outpatient Encounter Medications as of 04/19/2024  Medication Sig   acetaminophen  (TYLENOL ) 500 MG tablet Take 1,000 mg by mouth every 6 (six) hours as needed for mild pain (pain score 1-3) or moderate pain (pain score 4-6).   albuterol  (PROVENTIL ) (2.5 MG/3ML) 0.083% nebulizer solution Take 3 mLs (2.5 mg total) by nebulization every 6 (six) hours as needed for wheezing or shortness of breath.   albuterol  (VENTOLIN  HFA) 108 (90 Base) MCG/ACT inhaler Inhale 2 puffs into the lungs every 6 (six) hours as needed.   ARIPiprazole (ABILIFY) 15 MG tablet Take 15 mg by mouth daily.   Armodafinil  150 MG tablet TAKE 1 TABLET BY MOUTH EVERY DAY   busPIRone  (BUSPAR ) 30 MG tablet Take 1 tablet (30 mg total) by mouth 2 (two) times daily.   celecoxib  (CELEBREX ) 200 MG capsule Take 1  capsule (200 mg total) by mouth 2 (two) times daily.   diclofenac  Sodium (VOLTAREN ) 1 % GEL APPLY AS NEEDED 3 TIMES A DAY   docusate sodium (COLACE) 100 MG capsule Take 200 mg by mouth daily.   DULoxetine  (CYMBALTA ) 60 MG capsule TAKE 2 CAPSULES BY MOUTH DAILY   fentaNYL  (DURAGESIC ) 100 MCG/HR Place 1 patch onto the skin every 3 (three) days.   fexofenadine  (ALLEGRA ) 180 MG tablet Take 1 tablet (180 mg total) by mouth daily.    fluticasone -salmeterol (WIXELA INHUB) 250-50 MCG/ACT AEPB INHALE 1 PUFF INTO THE LUNGS IN THE MORNING AND AT BEDTIME.   gabapentin  (NEURONTIN ) 800 MG tablet TAKE 1 TABLET BY MOUTH THREE TIMES A DAY   haloperidol  (HALDOL ) 0.5 MG tablet TAKE 1/2 OF A TABLET (0.25 MG TOTAL) BY MOUTH TWICE A DAY (Patient taking differently: TAKE 1/2 OF A TABLET (0.25 MG TOTAL) BY MOUTH TWICE A DAY)   hydrOXYzine (VISTARIL) 25 MG capsule Take 25 mg by mouth as needed.   Melatonin 10 MG TABS Take 20 mg by mouth at bedtime.   methocarbamol  (ROBAXIN ) 500 MG tablet Take 1 tablet (500 mg total) by mouth every 8 (eight) hours as needed.   Multiple Vitamins-Minerals (HAIR SKIN & NAILS PO) Take 2 tablets by mouth daily.   Multiple Vitamins-Minerals (MULTIVITAMIN WITH MINERALS) tablet Take 2 tablets by mouth daily.   naloxone (NARCAN) nasal spray 4 mg/0.1 mL Place 1 spray into the nose as needed.   omeprazole  (PRILOSEC) 40 MG capsule Take 1 capsule (40 mg total) by mouth 2 (two) times daily.   ondansetron  (ZOFRAN -ODT) 4 MG disintegrating tablet DISSOLVE 1 TABLET IN MOUTH EVERY 8 HOURS FOR NAUSEA AND VOMITING   pimecrolimus (ELIDEL) 1 % cream Apply twice daily to white patches on face   QUEtiapine  (SEROQUEL ) 100 MG tablet Take 1 tablet (100 mg total) by mouth at bedtime.   No facility-administered encounter medications on file as of 04/19/2024.    Allergies (verified) Augmentin  [amoxicillin-pot clavulanate], Chocolate flavoring agent (non-screening), Demerol [meperidine], Keflex  [cephalexin ], Morphine and codeine, Tape, Toradol  [ketorolac  tromethamine ], Aspirin, Cephalosporins, Amoxicillin, Chocolate, Nsaids, and Strawberry extract   History: Past Medical History:  Diagnosis Date   Ehlers-Danlos syndrome    Fibromyalgia    Headache    Polyp of descending colon    Sleep apnea    Tics of organic origin    Transient alteration of awareness    Past Surgical History:  Procedure Laterality Date   COLONOSCOPY WITH PROPOFOL   N/A 02/13/2018   Procedure: COLONOSCOPY WITH PROPOFOL ;  Surgeon: Jinny Carmine, MD;  Location: [facility] ENDOSCOPY;  Service: Endoscopy;  Laterality: N/A;   COLONOSCOPY WITH PROPOFOL  N/A 01/11/2022   Procedure: COLONOSCOPY WITH PROPOFOL ;  Surgeon: Jinny Carmine, MD;  Location: [facility] ENDOSCOPY;  Service: Endoscopy;  Laterality: N/A;   DILATION AND CURETTAGE OF UTERUS  08/16/2007   ESOPHAGOGASTRODUODENOSCOPY (EGD) WITH PROPOFOL   02/13/2018   Procedure: ESOPHAGOGASTRODUODENOSCOPY (EGD) WITH PROPOFOL ;  Surgeon: Jinny Carmine, MD;  Location: [facility] ENDOSCOPY;  Service: Endoscopy;;   EYE SURGERY Left 08/15/1988   3 Surgeries on left eye to correct crossed eye   LAPAROSCOPIC CHOLECYSTECTOMY  10/25/2023   [facility]   LAPAROSCOPY Left 08/15/2001   Fallopian Tube   MANDIBLE SURGERY  08/15/1996   OTHER SURGICAL HISTORY Right 08/15/1985   3 Surgeries to repair broken arm   OTHER SURGICAL HISTORY Left    3 surgeries on her left thigh as an infant   OTHER SURGICAL HISTORY  08/15/1996  Jaw surgery   Right arm surgery  08/15/1985   x 3 due to fracture   TOENAIL EXCISION Bilateral 06/13/2019   Procedure: BILATERAL SECOND TOE PARTIAL NAIL ABLATION;  Surgeon: Elsa Lonni SAUNDERS, MD;  Location: [facility];  Service: Orthopedics;  Laterality: Bilateral;  SURGERY REQUEST TIME 1 HOUR   TONSILLECTOMY  12/13/2002   TONSILLECTOMY     Family History  Problem Relation Age of Onset   Depression Mother    Stroke Mother    Fibromyalgia Mother    Osteoarthritis Mother    Asthma Brother    Depression Brother    Migraines Brother    Asperger's syndrome Brother    Other Brother        Ehlers-Danlos Syndrome   Cancer Maternal Aunt    Asperger's syndrome Maternal Aunt    Breast cancer Maternal Aunt    Heart disease Paternal Aunt    Heart disease Paternal Uncle    Cervical cancer Maternal Grandmother    Osteoporosis Maternal Grandmother        Died at 43   Osteoarthritis Maternal  Grandmother    Colon cancer Maternal Grandfather    Asthma Maternal Grandfather    Asperger's syndrome Maternal Grandfather    Emphysema Maternal Grandfather        Died at 14   Prostate cancer Maternal Grandfather    Heart attack Paternal Grandfather        Died at 49   Asthma Paternal Grandfather    Tuberculosis Paternal Grandfather    Cancer Cousin    Asperger's syndrome Cousin    Asperger's syndrome Other    Heart Problems Paternal Grandmother        Died at 14   Ehlers-Danlos syndrome Father    Ehlers-Danlos syndrome Brother    Social History   Socioeconomic History   Marital status: Single    Spouse name: Not on file   Number of children: 0   Years of education: Not on file   Highest education level: Bachelor's degree (e.g., BA, AB, BS)  Occupational History   Not on file  Tobacco Use   Smoking status: Never   Smokeless tobacco: Never  Vaping Use   Vaping status: Never Used  Substance and Sexual Activity   Alcohol use: No    Alcohol/week: 0.0 standard drinks of alcohol   Drug use: No   Sexual activity: Never  Other Topics Concern   Not on file  Social History Narrative   Karen [name] is 59.   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    She lives with her mother.    She enjoys reading, watching TV, writing, and painting.    Social Drivers of Corporate investment banker Strain: Low Risk  (04/19/2024)   Overall Financial Resource Strain (CARDIA)    Difficulty of Paying Living Expenses: Not hard at all  Food Insecurity: No Food Insecurity (04/19/2024)   Hunger Vital Sign    Worried About Running Out of Food in the Last Year: Never true    Ran Out of Food in the Last Year: Never true  Transportation Needs: No Transportation Needs (04/19/2024)   PRAPARE - Administrator, Civil Service (Medical): No    Lack of Transportation (Non-Medical): No  Physical Activity: Insufficiently Active (04/19/2024)   Exercise Vital Sign    Days of Exercise per Week: 2 days     Minutes of Exercise per Session: 30 min  Stress: No Stress Concern Present (04/19/2024)  [employer] of Occupational Health - Occupational Stress Questionnaire    Feeling of Stress: Only a little  Social Connections: Socially Isolated (04/19/2024)   Social Connection and Isolation Panel    Frequency of Communication with Friends and Family: Once a week    Frequency of Social Gatherings with Friends and Family: Never    Attends Religious Services: Never    Diplomatic Services operational officer: No    Attends Engineer, structural: Never    Marital Status: Never married    Tobacco Counseling Counseling given: Not Answered    Clinical Intake:  Pre-visit preparation completed: Yes  Pain : No/denies pain     BMI - recorded: 25.99 Nutritional Status: BMI 25 -29 Overweight Nutritional Risks: None Diabetes: No  No results found for: HGBA1C   How often do you need to have someone help you when you read instructions, pamphlets, or other written materials from your doctor or pharmacy?: 1 - Never  Interpreter Needed?: No  Information entered by :: Sabreen Kitchen,CMA   Activities of Daily Living     04/19/2024   11:13 AM  In your present state of health, do you have any difficulty performing the following activities:  Hearing? 0  Vision? 0  Difficulty concentrating or making decisions? 0  Walking or climbing stairs? 1  Dressing or bathing? 1  Doing errands, shopping? 0  Preparing Food and eating ? N  Using the Toilet? N  In the past six months, have you accidently leaked urine? Y  Do you have problems with loss of bowel control? N  Managing your Medications? N  Managing your Finances? Y  Housekeeping or managing your Housekeeping? Y    Patient Care Team: Gasper Nancyann BRAVO, MD as PCP - General (Family Medicine) Susen Elsie DEL, MD (Inactive) as Consulting Physician (Pediatric Neurology) Marianna City, NP as Nurse Practitioner (Neurology) Ermalinda Lenn HERO, LCSW as [facility], [place], [place]  I have updated your Care Teams any recent Medical Services you may have received from other providers in the past year.     Assessment:   This is a routine wellness examination for Karen [name].  Hearing/Vision screen Hearing Screening - Comments:: No difficulties Vision Screening - Comments:: Patient wears glasse   Goals Addressed             This Visit's Progress    Patient Stated       To get back into music        Depression Screen     04/19/2024   11:16 AM 03/22/2024    3:59 PM 12/11/2023    2:53 PM 11/07/2023    3:07 PM 09/20/2023    2:17 PM 09/20/2023    2:14 PM 09/12/2023    1:34 PM  PHQ 2/9 Scores  PHQ - 2 Score 2 2 5 3 4 4 4   PHQ- 9 Score 4 15 20 10 16  13     Fall Risk     04/19/2024   11:12 AM 03/22/2024    3:59 PM 12/11/2023    2:52 PM 03/03/2023    1:31 PM 02/21/2023    2:24 PM  Fall Risk   Falls in the past year? 1 1 1 1 1   Number falls in past yr: 1 1 1 1 1   Comment   5    Injury with Fall? 0 0 0 1 0  Risk for fall due to : History of fall(s);Impaired balance/gait;Orthopedic patient;Impaired mobility Impaired balance/gait  Impaired mobility History of fall(s);Impaired balance/gait;Orthopedic patient Impaired balance/gait;Impaired mobility;Medication side effect  Follow up Falls evaluation completed;Education provided Falls evaluation completed  Education provided;Falls prevention discussed;Falls evaluation completed Falls prevention discussed    MEDICARE RISK AT HOME:  Medicare Risk at Home Any stairs in or around the home?: No If so, are there any without handrails?: No Home free of loose throw rugs in walkways, pet beds, electrical cords, etc?: Yes Adequate lighting in your home to reduce risk of falls?: Yes Life alert?: No Use of a cane, walker or w/c?: No Grab bars in the bathroom?: Yes Shower chair or bench in shower?: Yes Elevated toilet seat or a handicapped toilet?: Yes  TIMED UP AND  GO:  Was the test performed?  No  Cognitive Function: 6CIT completed    04/20/2022   12:08 PM  MMSE - Mini Mental State Exam  Orientation to time   Orientation to Place   Registration   Attention/ Calculation   Recall   Language- name 2 objects   Language- repeat   Language- follow 3 step command   Language- read & follow direction   Write a sentence   Copy design   Total score      Information is confidential and restricted. Go to Review Flowsheets to unlock data.        04/19/2024   11:17 AM 01/03/2023    3:37 PM  6CIT Screen  What Year? 0 points 0 points  What month? 0 points 0 points  What time? 0 points 0 points  Count back from 20 0 points 0 points  Months in reverse 0 points 0 points  Repeat phrase 0 points 0 points  Total Score 0 points 0 points    Immunizations Immunization History  Administered Date(s) Administered   H1N1 06/03/2008   HPV 9-valent 07/19/2018   Influenza Split 06/14/2012   Influenza, Mdck, Trivalent,PF 6+ MOS(egg free) 04/13/2024   Influenza, Seasonal, Injecte, Preservative Fre 04/14/2023   Influenza,inj,Quad PF,6+ Mos 10/01/2018, 05/26/2020, 07/19/2021, 05/10/2022   Influenza-Unspecified 05/24/2017, 05/16/2019   Moderna Covid-19 Fall Seasonal Vaccine 68yrs & older 05/19/2022   Moderna Covid-19 Vaccine  Bivalent Booster 36yrs & up 07/19/2021   PFIZER(Purple Top)SARS-COV-2 Vaccination 11/05/2019, 11/26/2019, 05/26/2020   PNEUMOCOCCAL CONJUGATE-20 04/13/2024   Pneumococcal Polysaccharide-23 05/24/2017   Tdap 06/14/2012    Screening Tests Health Maintenance  Topic Date Due   Hepatitis C Screening  Never done   Hepatitis B Vaccines 19-59 Average Risk (1 of 3 - 19+ 3-dose series) Never done   DTaP/Tdap/Td (2 - Td or Tdap) 06/14/2022   MAMMOGRAM  05/13/2023   COVID-19 Vaccine (6 - 2025-26 season) 04/15/2024   Medicare Annual Wellness (AWV)  04/19/2025   Colonoscopy  01/12/2027   Cervical Cancer Screening (HPV/Pap Cotest)  05/07/2028    Pneumococcal Vaccine  Completed   Influenza Vaccine  Completed   HIV Screening  Completed   Meningococcal B Vaccine  Aged Out   HPV VACCINES  Discontinued    Health Maintenance  Health Maintenance Due  Topic Date Due   Hepatitis C Screening  Never done   Hepatitis B Vaccines 19-59 Average Risk (1 of 3 - 19+ 3-dose series) Never done   DTaP/Tdap/Td (2 - Td or Tdap) 06/14/2022   MAMMOGRAM  05/13/2023   COVID-19 Vaccine (6 - 2025-26 season) 04/15/2024   Health Maintenance Items Addressed:   Additional Screening:  Vision Screening: Recommended annual ophthalmology exams for early detection of glaucoma and other disorders of the eye. Would you  like a referral to an eye doctor? No    Dental Screening: Recommended annual dental exams for proper oral hygiene  Community Resource Referral / Chronic Care Management: CRR required this visit?  No   CCM required this visit?  No   Plan:    I have personally reviewed and noted the following in the patient's chart:   Medical and social history Use of alcohol, tobacco or illicit drugs  Current medications and supplements including opioid prescriptions. Patient is not currently taking opioid prescriptions. Functional ability and status Nutritional status Physical activity Advanced directives List of other physicians Hospitalizations, surgeries, and ER visits in previous 12 months Vitals Screenings to include cognitive, depression, and falls Referrals and appointments  In addition, I have reviewed and discussed with patient certain preventive protocols, quality metrics, and best practice recommendations. A written personalized care plan for preventive services as well as general preventive health recommendations were provided to patient.   Lyle MARLA Right, [place]   04/19/2024   After Visit Summary: (MyChart) Due to this being a telephonic visit, the after visit summary with patients personalized plan was offered to patient via  MyChart   Notes: Nothing significant to report at this time.

## 2024-04-19 NOTE — Patient Instructions (Signed)
 Karen [name],  Thank you for taking the time for your Medicare Wellness Visit. I appreciate your continued commitment to your health goals. Please review the care plan we discussed, and feel free to reach out if I can assist you further.  Medicare recommends these wellness visits once per year to help you and your care team stay ahead of potential health issues. These visits are designed to focus on prevention, allowing your provider to concentrate on managing your acute and chronic conditions during your regular appointments.  Please note that Annual Wellness Visits do not include a physical exam. Some assessments may be limited, especially if the visit was conducted virtually. If needed, we may recommend a separate in-person follow-up with your provider.  Ongoing Care Seeing your primary care provider every 3 to 6 months helps us  monitor your health and provide consistent, personalized care.   Referrals If a referral was made during today's visit and you haven't received any updates within two weeks, please contact the referred provider directly to check on the status.  Recommended Screenings:  Health Maintenance  Topic Date Due   Hepatitis C Screening  Never done   Hepatitis B Vaccine (1 of 3 - 19+ 3-dose series) Never done   DTaP/Tdap/Td vaccine (2 - Td or Tdap) 06/14/2022   Mammogram  05/13/2023   COVID-19 Vaccine (6 - 2025-26 season) 04/15/2024   Medicare Annual Wellness Visit  04/19/2025   Colon Cancer Screening  01/12/2027   Pap with HPV screening  05/07/2028   Pneumococcal Vaccine  Completed   Flu Shot  Completed   HIV Screening  Completed   Meningitis B Vaccine  Aged Out   HPV Vaccine  Discontinued       04/19/2024   11:10 AM  Advanced Directives  Does Patient Have a Medical Advance Directive? No  Would patient like information on creating a medical advance directive? No - Patient declined   Advance Care Planning is important because it: Ensures you receive medical  care that aligns with your values, goals, and preferences. Provides guidance to your family and loved ones, reducing the emotional burden of decision-making during critical moments.  Vision: Annual vision screenings are recommended for early detection of glaucoma, cataracts, and diabetic retinopathy. These exams can also reveal signs of chronic conditions such as diabetes and high blood pressure.  Dental: Annual dental screenings help detect early signs of oral cancer, gum disease, and other conditions linked to overall health, including heart disease and diabetes.  Please see the attached documents for additional preventive care recommendations.

## 2024-04-23 DIAGNOSIS — G40909 Epilepsy, unspecified, not intractable, without status epilepticus: Secondary | ICD-10-CM | POA: Diagnosis not present

## 2024-04-23 DIAGNOSIS — G4700 Insomnia, unspecified: Secondary | ICD-10-CM | POA: Diagnosis not present

## 2024-04-23 DIAGNOSIS — F329 Major depressive disorder, single episode, unspecified: Secondary | ICD-10-CM | POA: Diagnosis not present

## 2024-04-23 DIAGNOSIS — K219 Gastro-esophageal reflux disease without esophagitis: Secondary | ICD-10-CM | POA: Diagnosis not present

## 2024-04-23 DIAGNOSIS — Z79899 Other long term (current) drug therapy: Secondary | ICD-10-CM | POA: Diagnosis not present

## 2024-04-23 DIAGNOSIS — F333 Major depressive disorder, recurrent, severe with psychotic symptoms: Secondary | ICD-10-CM | POA: Diagnosis not present

## 2024-04-23 DIAGNOSIS — F322 Major depressive disorder, single episode, severe without psychotic features: Secondary | ICD-10-CM | POA: Diagnosis not present

## 2024-04-23 DIAGNOSIS — G4730 Sleep apnea, unspecified: Secondary | ICD-10-CM | POA: Diagnosis not present

## 2024-04-23 DIAGNOSIS — J4530 Mild persistent asthma, uncomplicated: Secondary | ICD-10-CM | POA: Diagnosis not present

## 2024-04-23 DIAGNOSIS — G4739 Other sleep apnea: Secondary | ICD-10-CM | POA: Diagnosis not present

## 2024-04-23 DIAGNOSIS — Z9989 Dependence on other enabling machines and devices: Secondary | ICD-10-CM | POA: Diagnosis not present

## 2024-04-23 DIAGNOSIS — Z79891 Long term (current) use of opiate analgesic: Secondary | ICD-10-CM | POA: Diagnosis not present

## 2024-04-23 DIAGNOSIS — G8250 Quadriplegia, unspecified: Secondary | ICD-10-CM | POA: Diagnosis not present

## 2024-04-24 DIAGNOSIS — Z136 Encounter for screening for cardiovascular disorders: Secondary | ICD-10-CM | POA: Diagnosis not present

## 2024-04-25 DIAGNOSIS — F411 Generalized anxiety disorder: Secondary | ICD-10-CM | POA: Diagnosis not present

## 2024-04-25 DIAGNOSIS — F331 Major depressive disorder, recurrent, moderate: Secondary | ICD-10-CM | POA: Diagnosis not present

## 2024-04-26 ENCOUNTER — Ambulatory Visit
Admission: RE | Admit: 2024-04-26 | Discharge: 2024-04-26 | Disposition: A | Discharge status: Home / Self Care | Source: Ambulatory Visit | Attending: Family Medicine | Admitting: Family Medicine

## 2024-04-26 DIAGNOSIS — E041 Nontoxic single thyroid nodule: Secondary | ICD-10-CM | POA: Insufficient documentation

## 2024-05-01 ENCOUNTER — Ambulatory Visit: Payer: Self-pay | Admitting: Family Medicine

## 2024-05-01 ENCOUNTER — Other Ambulatory Visit: Payer: Self-pay | Admitting: Family Medicine

## 2024-05-02 DIAGNOSIS — F411 Generalized anxiety disorder: Secondary | ICD-10-CM | POA: Diagnosis not present

## 2024-05-02 DIAGNOSIS — F331 Major depressive disorder, recurrent, moderate: Secondary | ICD-10-CM | POA: Diagnosis not present

## 2024-05-06 ENCOUNTER — Other Ambulatory Visit: Payer: Self-pay | Admitting: Family Medicine

## 2024-05-06 DIAGNOSIS — M5136 Other intervertebral disc degeneration, lumbar region with discogenic back pain only: Secondary | ICD-10-CM

## 2024-05-06 NOTE — Telephone Encounter (Unsigned)
 Copied from CRM [id number]. Topic: Clinical - Medication Refill >> May 06, 2024  4:07 PM Carla L wrote: Medication: fentaNYL  (DURAGESIC ) 100 MCG/HR  Has the patient contacted their pharmacy? No Needs to contact the office for further refills.  This is the patient's preferred pharmacy:  [id number] [place], [place] - [address] [place] [address] Phone: [phone] Fax: [phone]  Is this the correct pharmacy for this prescription? Yes  Has the prescription been filled recently? No  Is the patient out of the medication? No  Has the patient been seen for an appointment in the last year OR does the patient have an upcoming appointment? Yes  Can we respond through MyChart? No  Agent: Please be advised that Rx refills may take up to 3 business days. We ask that you follow-up with your pharmacy.

## 2024-05-07 ENCOUNTER — Other Ambulatory Visit (HOSPITAL_COMMUNITY): Payer: Self-pay

## 2024-05-07 DIAGNOSIS — Z79891 Long term (current) use of opiate analgesic: Secondary | ICD-10-CM | POA: Diagnosis not present

## 2024-05-07 DIAGNOSIS — I341 Nonrheumatic mitral (valve) prolapse: Secondary | ICD-10-CM | POA: Diagnosis not present

## 2024-05-07 DIAGNOSIS — Q796 Ehlers-Danlos syndrome, unspecified: Secondary | ICD-10-CM | POA: Diagnosis not present

## 2024-05-07 DIAGNOSIS — M1990 Unspecified osteoarthritis, unspecified site: Secondary | ICD-10-CM | POA: Diagnosis not present

## 2024-05-07 DIAGNOSIS — F333 Major depressive disorder, recurrent, severe with psychotic symptoms: Secondary | ICD-10-CM | POA: Diagnosis not present

## 2024-05-07 DIAGNOSIS — M797 Fibromyalgia: Secondary | ICD-10-CM | POA: Diagnosis not present

## 2024-05-07 DIAGNOSIS — F322 Major depressive disorder, single episode, severe without psychotic features: Secondary | ICD-10-CM | POA: Diagnosis not present

## 2024-05-07 DIAGNOSIS — K219 Gastro-esophageal reflux disease without esophagitis: Secondary | ICD-10-CM | POA: Diagnosis not present

## 2024-05-07 DIAGNOSIS — J4530 Mild persistent asthma, uncomplicated: Secondary | ICD-10-CM | POA: Diagnosis not present

## 2024-05-07 DIAGNOSIS — R011 Cardiac murmur, unspecified: Secondary | ICD-10-CM | POA: Diagnosis not present

## 2024-05-07 DIAGNOSIS — Z993 Dependence on wheelchair: Secondary | ICD-10-CM | POA: Diagnosis not present

## 2024-05-07 DIAGNOSIS — Z8614 Personal history of Methicillin resistant Staphylococcus aureus infection: Secondary | ICD-10-CM | POA: Diagnosis not present

## 2024-05-07 DIAGNOSIS — J45909 Unspecified asthma, uncomplicated: Secondary | ICD-10-CM | POA: Diagnosis not present

## 2024-05-07 DIAGNOSIS — Z9989 Dependence on other enabling machines and devices: Secondary | ICD-10-CM | POA: Diagnosis not present

## 2024-05-07 DIAGNOSIS — F329 Major depressive disorder, single episode, unspecified: Secondary | ICD-10-CM | POA: Diagnosis not present

## 2024-05-07 DIAGNOSIS — Z791 Long term (current) use of non-steroidal anti-inflammatories (NSAID): Secondary | ICD-10-CM | POA: Diagnosis not present

## 2024-05-07 DIAGNOSIS — G4733 Obstructive sleep apnea (adult) (pediatric): Secondary | ICD-10-CM | POA: Diagnosis not present

## 2024-05-07 DIAGNOSIS — G4730 Sleep apnea, unspecified: Secondary | ICD-10-CM | POA: Diagnosis not present

## 2024-05-07 DIAGNOSIS — G40909 Epilepsy, unspecified, not intractable, without status epilepticus: Secondary | ICD-10-CM | POA: Diagnosis not present

## 2024-05-07 DIAGNOSIS — Z79899 Other long term (current) drug therapy: Secondary | ICD-10-CM | POA: Diagnosis not present

## 2024-05-07 DIAGNOSIS — Z87820 Personal history of traumatic brain injury: Secondary | ICD-10-CM | POA: Diagnosis not present

## 2024-05-07 DIAGNOSIS — G8929 Other chronic pain: Secondary | ICD-10-CM | POA: Diagnosis not present

## 2024-05-07 NOTE — Telephone Encounter (Signed)
 Requested medications are due for refill today.  yes  Requested medications are on the active medications list.  yes  Last refill. 04/09/2024 #10 0 rf  Future visit scheduled.   yes  Notes to clinic.  Refill not delegated.    Requested Prescriptions  Pending Prescriptions Disp Refills   fentaNYL  (DURAGESIC ) 100 MCG/HR 10 patch 0    Sig: Place 1 patch onto the skin every 3 (three) days.     Not Delegated - Analgesics:  Opioid Agonists Failed - 05/07/2024  2:51 PM      Failed - This refill cannot be delegated      Passed - Urine Drug Screen completed in last 360 days      Passed - Valid encounter within last 3 months    Recent Outpatient Visits           1 month ago Mild persistent asthma with exacerbation   [place] [facility] Gasper Nancyann BRAVO, MD   4 months ago Arthritis   [facility] Gasper Nancyann BRAVO, MD   6 months ago Head injury with loss of consciousness [facility])    [facility] [place], [place], PA-C       Future Appointments             In 5 months Claudene Lehmann, MD [facility]

## 2024-05-08 MED ORDER — FENTANYL 100 MCG/HR TD PT72: 1.0000 | MEDICATED_PATCH | TRANSDERMAL | 0 refills | Status: DC

## 2024-05-10 ENCOUNTER — Other Ambulatory Visit (HOSPITAL_COMMUNITY): Payer: Self-pay

## 2024-05-13 ENCOUNTER — Other Ambulatory Visit (HOSPITAL_COMMUNITY): Payer: Self-pay

## 2024-05-13 ENCOUNTER — Telehealth: Payer: Self-pay

## 2024-05-13 ENCOUNTER — Telehealth: Payer: Self-pay | Admitting: Pharmacy Technician

## 2024-05-13 NOTE — Telephone Encounter (Signed)
 Please note that PA was approved   Copied from CRM #[id number]. Topic: Clinical - Medication Refill >> May 06, 2024  4:07 PM Carla L wrote: Medication: fentaNYL  (DURAGESIC ) 100 MCG/HR  Has the patient contacted their pharmacy? No Needs to contact the office for further refills.  This is the patient's preferred pharmacy:  [id number] [place], [place] - [address] [address] Phone: [phone] Fax: [phone]  Is this the correct pharmacy for this prescription? Yes  Has the prescription been filled recently? No  Is the patient out of the medication? No  Has the patient been seen for an appointment in the last year OR does the patient have an upcoming appointment? Yes  Can we respond through MyChart? No  Agent: Please be advised that Rx refills may take up to 3 business days. We ask that you follow-up with your pharmacy. >> May 13, 2024  9:31 AM Mia F wrote: Pt is checking the status of the fentaNYL  (DURAGESIC ) 100 MCG/HR. According to chart rx was confirmed by pharmacy on 9/24. Pt says pharmacy does not have it yet

## 2024-05-13 NOTE — Telephone Encounter (Signed)
 Pharmacy Patient Advocate Encounter  Received notification from [facility] that Prior Authorization for fentaNYL  100MCG/HR 72 hr patches has been APPROVED from 05/10/2024 to 11/09/2024. Ran test claim, Copay is $0.00. This test claim was processed through [facility]- copay amounts may vary at other pharmacies due to pharmacy/plan contracts, or as the patient moves through the different stages of their insurance plan.   PA #/Case ID/Reference #: [id number]

## 2024-05-13 NOTE — Telephone Encounter (Signed)
 Pharmacy Patient Advocate Encounter   Received notification from Onbase that prior authorization for fentaNYL  100MCG/HR 72 hr patches is required/requested.   Insurance verification completed.   The patient is insured through [facility] .   Per test claim: PA required; PA submitted to above mentioned insurance via Latent Key/confirmation #/EOC [id number] Status is pending

## 2024-05-15 ENCOUNTER — Other Ambulatory Visit: Payer: Self-pay | Admitting: Family Medicine

## 2024-05-15 DIAGNOSIS — M5136 Other intervertebral disc degeneration, lumbar region with discogenic back pain only: Secondary | ICD-10-CM

## 2024-05-15 MED ORDER — FENTANYL 100 MCG/HR TD PT72: 1.0000 | MEDICATED_PATCH | TRANSDERMAL | 0 refills | Status: DC

## 2024-06-04 ENCOUNTER — Other Ambulatory Visit: Payer: Self-pay

## 2024-06-04 ENCOUNTER — Telehealth: Payer: Self-pay

## 2024-06-04 DIAGNOSIS — M5136 Other intervertebral disc degeneration, lumbar region with discogenic back pain only: Secondary | ICD-10-CM

## 2024-06-04 NOTE — Telephone Encounter (Signed)
 Patient needs prescription send in. States only has 3 patches left.

## 2024-06-04 NOTE — Telephone Encounter (Signed)
 Note Rx for #10 sent on 05/15/24     Copied from CRM #[id number]. Topic: Clinical - Prescription Issue >> Jun 03, 2024  4:59 PM Roselie BROCKS wrote: Reason for CRM: Patient request a call back concerning patches, and a return call asap

## 2024-06-04 NOTE — Telephone Encounter (Signed)
 Rx for #10 sent on 05/15/24   Copied from CRM #[id number]. Topic: Clinical - Prescription Issue >> Jun 03, 2024  4:59 PM Roselie BROCKS wrote: Reason for CRM: Patient request a call back concerning patches, and a return call asap

## 2024-06-05 DIAGNOSIS — Z8673 Personal history of transient ischemic attack (TIA), and cerebral infarction without residual deficits: Secondary | ICD-10-CM | POA: Diagnosis not present

## 2024-06-05 DIAGNOSIS — Z886 Allergy status to analgesic agent status: Secondary | ICD-10-CM | POA: Diagnosis not present

## 2024-06-05 DIAGNOSIS — F322 Major depressive disorder, single episode, severe without psychotic features: Secondary | ICD-10-CM | POA: Diagnosis not present

## 2024-06-05 DIAGNOSIS — Z880 Allergy status to penicillin: Secondary | ICD-10-CM | POA: Diagnosis not present

## 2024-06-05 DIAGNOSIS — J45909 Unspecified asthma, uncomplicated: Secondary | ICD-10-CM | POA: Diagnosis not present

## 2024-06-05 DIAGNOSIS — Z885 Allergy status to narcotic agent status: Secondary | ICD-10-CM | POA: Diagnosis not present

## 2024-06-05 DIAGNOSIS — M797 Fibromyalgia: Secondary | ICD-10-CM | POA: Diagnosis not present

## 2024-06-05 DIAGNOSIS — F329 Major depressive disorder, single episode, unspecified: Secondary | ICD-10-CM | POA: Diagnosis not present

## 2024-06-05 DIAGNOSIS — G4733 Obstructive sleep apnea (adult) (pediatric): Secondary | ICD-10-CM | POA: Diagnosis not present

## 2024-06-05 DIAGNOSIS — Z79899 Other long term (current) drug therapy: Secondary | ICD-10-CM | POA: Diagnosis not present

## 2024-06-05 DIAGNOSIS — K219 Gastro-esophageal reflux disease without esophagitis: Secondary | ICD-10-CM | POA: Diagnosis not present

## 2024-06-05 DIAGNOSIS — F250 Schizoaffective disorder, bipolar type: Secondary | ICD-10-CM | POA: Diagnosis not present

## 2024-06-05 DIAGNOSIS — Z7951 Long term (current) use of inhaled steroids: Secondary | ICD-10-CM | POA: Diagnosis not present

## 2024-06-05 DIAGNOSIS — Z881 Allergy status to other antibiotic agents status: Secondary | ICD-10-CM | POA: Diagnosis not present

## 2024-06-05 DIAGNOSIS — Z9989 Dependence on other enabling machines and devices: Secondary | ICD-10-CM | POA: Diagnosis not present

## 2024-06-05 DIAGNOSIS — Z87891 Personal history of nicotine dependence: Secondary | ICD-10-CM | POA: Diagnosis not present

## 2024-06-09 MED ORDER — FENTANYL 100 MCG/HR TD PT72: 1.0000 | MEDICATED_PATCH | TRANSDERMAL | 0 refills | Status: DC

## 2024-06-10 ENCOUNTER — Ambulatory Visit: Admitting: Family Medicine

## 2024-06-24 DIAGNOSIS — F331 Major depressive disorder, recurrent, moderate: Secondary | ICD-10-CM | POA: Diagnosis not present

## 2024-06-24 DIAGNOSIS — F329 Major depressive disorder, single episode, unspecified: Secondary | ICD-10-CM | POA: Diagnosis not present

## 2024-06-24 DIAGNOSIS — Z79899 Other long term (current) drug therapy: Secondary | ICD-10-CM | POA: Diagnosis not present

## 2024-07-04 ENCOUNTER — Other Ambulatory Visit: Payer: Self-pay | Admitting: Family Medicine

## 2024-07-04 DIAGNOSIS — M5136 Other intervertebral disc degeneration, lumbar region with discogenic back pain only: Secondary | ICD-10-CM

## 2024-07-04 NOTE — Telephone Encounter (Signed)
 Copied from CRM #[id number]. Topic: Clinical - Medication Refill >> Jul 04, 2024  4:42 PM Ashley R wrote: Medication:  fentaNYL  (DURAGESIC ) 100 MCG/HR    Has the patient contacted their pharmacy? Yes   This is the patient's preferred pharmacy:  [id number] [place] [place] - [address] [place] [address] Phone: [phone] Fax: [phone]  Is this the correct pharmacy for this prescription? Yes If no, delete pharmacy and type the correct one.   Has the prescription been filled recently? Yes  Is the patient out of the medication? No, 3 left  Has the patient been seen for an appointment in the last year OR does the patient have an upcoming appointment? Yes  Can we respond through MyChart? No  Agent: Please be advised that Rx refills may take up to 3 business days. We ask that you follow-up with your pharmacy.

## 2024-07-06 NOTE — Telephone Encounter (Signed)
 Requested medication (s) are due for refill today: Yes  Requested medication (s) are on the active medication list: Yes  Last refill:  06/09/24  Future visit scheduled: No  Notes to clinic:  Unable to refill per protocol, cannot delegate.      Requested Prescriptions  Pending Prescriptions Disp Refills   fentaNYL  (DURAGESIC ) 100 MCG/HR 10 patch 0    Sig: Place 1 patch onto the skin every 3 (three) days.     Not Delegated - Analgesics:  Opioid Agonists Failed - 07/06/2024  9:31 AM      Failed - This refill cannot be delegated      Passed - Urine Drug Screen completed in last 360 days      Passed - Valid encounter within last 3 months    Recent Outpatient Visits           3 months ago Mild persistent asthma with exacerbation   [facility] Gasper Nancyann BRAVO, Karen [name]   6 months ago Arthritis   [facility] Gasper Nancyann BRAVO, Karen [name]   8 months ago Head injury with loss of consciousness [facility])    [facility] [place], [place], PA-C       Future Appointments             In 3 months Karen Lehmann, Karen [name] [facility]

## 2024-07-07 ENCOUNTER — Other Ambulatory Visit: Payer: Self-pay | Admitting: Family Medicine

## 2024-07-08 ENCOUNTER — Other Ambulatory Visit: Payer: Self-pay | Admitting: Family Medicine

## 2024-07-08 DIAGNOSIS — G4710 Hypersomnia, unspecified: Secondary | ICD-10-CM

## 2024-07-08 MED ORDER — FENTANYL 100 MCG/HR TD PT72: 1.0000 | MEDICATED_PATCH | TRANSDERMAL | 0 refills | Status: DC

## 2024-07-09 ENCOUNTER — Ambulatory Visit (INDEPENDENT_AMBULATORY_CARE_PROVIDER_SITE_OTHER)

## 2024-07-09 VITALS — Ht 59.5 in | Wt 136.5 lb

## 2024-07-09 DIAGNOSIS — Z0001 Encounter for general adult medical examination with abnormal findings: Secondary | ICD-10-CM | POA: Diagnosis not present

## 2024-07-09 DIAGNOSIS — Z1231 Encounter for screening mammogram for malignant neoplasm of breast: Secondary | ICD-10-CM | POA: Diagnosis not present

## 2024-07-09 DIAGNOSIS — Z0000 Encounter for general adult medical examination without abnormal findings: Secondary | ICD-10-CM

## 2024-07-09 DIAGNOSIS — R350 Frequency of micturition: Secondary | ICD-10-CM | POA: Diagnosis not present

## 2024-07-09 NOTE — Patient Instructions (Addendum)
 Karen [name],  Thank you for taking the time for your Medicare Wellness Visit. I appreciate your continued commitment to your health goals. Please review the care plan we discussed, and feel free to reach out if I can assist you further.  Please note that Annual Wellness Visits do not include a physical exam. Some assessments may be limited, especially if the visit was conducted virtually. If needed, we may recommend an in-person follow-up with your provider.  Ongoing Care Seeing your primary care provider every 3 to 6 months helps us  monitor your health and provide consistent, personalized care.   Referrals If a referral was made during today's visit and you haven't received any updates within two weeks, please contact the referred provider directly to check on the status. REFERRAL FOR MAMMOGRAM You have an order for:  []   2D Mammogram  [x]   3D Mammogram  []   Bone Density     Please call for appointment:  [facility]  [address] [place] [address] [phone] [facility] [address] [place], [place] [address] [phone] [facility] [address] [place], [place] [address] [phone]   Make sure to wear two-piece clothing.  No lotions, powders, or deodorants the day of the appointment. Make sure to bring picture ID and insurance card.  Bring list of medications you are currently taking including any supplements.   Schedule your [place] screening mammogram through MyChart!   Log into your MyChart account.  Go to 'Visit' (or 'Appointments' if on mobile App) --> Schedule an Appointment  Under 'Select a Reason for Visit' choose the Mammogram Screening option.  Complete the pre-visit questions and select the time and place that best fits your schedule.   Recommended Screenings:  Health Maintenance  Topic Date Due   Hepatitis C  Screening  Never done   Hepatitis B Vaccine (1 of 3 - 19+ 3-dose series) Never done   DTaP/Tdap/Td vaccine (2 - Td or Tdap) 06/14/2022   Breast Cancer Screening  05/13/2023   COVID-19 Vaccine (6 - 2025-26 season) 04/15/2024   Medicare Annual Wellness Visit  07/09/2025   Colon Cancer Screening  01/12/2027   Pap with HPV screening  05/07/2028   Pneumococcal Vaccine  Completed   Flu Shot  Completed   HIV Screening  Completed   Meningitis B Vaccine  Aged Out   HPV Vaccine  Discontinued       07/09/2024    9:04 AM  Advanced Directives  Does Patient Have a Medical Advance Directive? No  Would patient like information on creating a medical advance directive? No - Patient declined    Vision: Annual vision screenings are recommended for early detection of glaucoma, cataracts, and diabetic retinopathy. These exams can also reveal signs of chronic conditions such as diabetes and high blood pressure.  Dental: Annual dental screenings help detect early signs of oral cancer, gum disease, and other conditions linked to overall health, including heart disease and diabetes.  Please see the attached documents for additional preventive care recommendations.   NEXT AWV 07/15/25 @ 8:50 AM IN PERSON

## 2024-07-09 NOTE — Addendum Note (Signed)
 Addended by: GWENN JHONNIE RAMAN on: 07/09/2024 09:57 AM   Modules accepted: Orders

## 2024-07-09 NOTE — Progress Notes (Addendum)
 No chief complaint on file.    Subjective:   Karen [name] is a 48 y.o. female who presents for a Medicare Annual Wellness Visit.  Allergies (verified) Augmentin  [amoxicillin-pot clavulanate], Chocolate flavoring agent (non-screening), Demerol [meperidine], Keflex  [cephalexin ], Morphine and codeine, Tape, Toradol  [ketorolac  tromethamine ], Aspirin, Cephalosporins, Amoxicillin, Chocolate, Nsaids, and Strawberry extract   History: Past Medical History:  Diagnosis Date   Ehlers-Danlos syndrome    Fibromyalgia    Headache    Polyp of descending colon    Sleep apnea    Tics of organic origin    Transient alteration of awareness    Past Surgical History:  Procedure Laterality Date   COLONOSCOPY WITH PROPOFOL  N/A 02/13/2018   Procedure: COLONOSCOPY WITH PROPOFOL ;  Surgeon: Jinny Carmine, MD;  Location: [facility];  Service: Endoscopy;  Laterality: N/A;   COLONOSCOPY WITH PROPOFOL  N/A 01/11/2022   Procedure: COLONOSCOPY WITH PROPOFOL ;  Surgeon: Jinny Carmine, MD;  Location: [facility];  Service: Endoscopy;  Laterality: N/A;   DILATION AND CURETTAGE OF UTERUS  08/16/2007   ESOPHAGOGASTRODUODENOSCOPY (EGD) WITH PROPOFOL   02/13/2018   Procedure: ESOPHAGOGASTRODUODENOSCOPY (EGD) WITH PROPOFOL ;  Surgeon: Jinny Carmine, MD;  Location: [facility];  Service: Endoscopy;;   EYE SURGERY Left 08/15/1988   3 Surgeries on left eye to correct crossed eye   LAPAROSCOPIC CHOLECYSTECTOMY  10/25/2023   [facility]   LAPAROSCOPY Left 08/15/2001   Fallopian Tube   MANDIBLE SURGERY  08/15/1996   OTHER SURGICAL HISTORY Right 08/15/1985   3 Surgeries to repair broken arm   OTHER SURGICAL HISTORY Left    3 surgeries on her left thigh as an infant   OTHER SURGICAL HISTORY  08/15/1996   Jaw surgery   Right arm surgery  08/15/1985   x 3 due to fracture   TOENAIL EXCISION Bilateral 06/13/2019   Procedure: BILATERAL SECOND TOE PARTIAL NAIL ABLATION;  Surgeon: Elsa Lonni SAUNDERS, MD;  Location: [facility];  Service: Orthopedics;  Laterality: Bilateral;  SURGERY REQUEST TIME 1 HOUR   TONSILLECTOMY  12/13/2002   TONSILLECTOMY     Family History  Problem Relation Age of Onset   Depression Mother    Stroke Mother    Fibromyalgia Mother    Osteoarthritis Mother    Asthma Brother    Depression Brother    Migraines Brother    Asperger's syndrome Brother    Other Brother        Ehlers-Danlos Syndrome   Cancer Maternal Aunt    Asperger's syndrome Maternal Aunt    Breast cancer Maternal Aunt    Heart disease Paternal Aunt    Heart disease Paternal Uncle    Cervical cancer Maternal Grandmother    Osteoporosis Maternal Grandmother        Died at 39   Osteoarthritis Maternal Grandmother    Colon cancer Maternal Grandfather    Asthma Maternal Grandfather    Asperger's syndrome Maternal Grandfather    Emphysema Maternal Grandfather        Died at 90   Prostate cancer Maternal Grandfather    Heart attack Paternal Grandfather        Died at 72   Asthma Paternal Grandfather    Tuberculosis Paternal Grandfather    Cancer Cousin    Asperger's syndrome Cousin    Asperger's syndrome Other    Heart Problems Paternal Grandmother        Died at 30   Ehlers-Danlos syndrome Father    Ehlers-Danlos syndrome Brother  Social History   Occupational History   Not on file  Tobacco Use   Smoking status: Never   Smokeless tobacco: Never  Vaping Use   Vaping status: Never Used  Substance and Sexual Activity   Alcohol use: No    Alcohol/week: 0.0 standard drinks of alcohol   Drug use: No   Sexual activity: Never   Tobacco Counseling Counseling given: Not Answered  SDOH Screenings   Food Insecurity: No Food Insecurity (04/19/2024)  Housing: Low Risk  (04/19/2024)  Transportation Needs: No Transportation Needs (04/19/2024)  Utilities: Not At Risk (04/19/2024)  Alcohol Screen: Low Risk  (04/19/2024)  Depression (PHQ2-9): Low Risk  (04/19/2024)   Recent Concern: Depression (PHQ2-9) - High Risk (03/22/2024)  Financial Resource Strain: Low Risk  (04/19/2024)  Physical Activity: Insufficiently Active (04/19/2024)  Social Connections: Socially Isolated (04/19/2024)  Stress: No Stress Concern Present (04/19/2024)  Tobacco Use: Low Risk  (04/19/2024)  Health Literacy: Adequate Health Literacy (04/19/2024)   See flowsheets for full screening details  Depression Screen PHQ 2 & 9 Depression Scale- Over the past 2 weeks, how often have you been bothered by any of the following problems? Little interest or pleasure in doing things: 1 Feeling down, depressed, or hopeless (PHQ Adolescent also includes...irritable): 1 PHQ-2 Total Score: 2 Trouble falling or staying asleep, or sleeping too much: 1 Feeling tired or having little energy: 1 Poor appetite or overeating (PHQ Adolescent also includes...weight loss): 0 Feeling bad about yourself - or that you are a failure or have let yourself or your family down: 0 Trouble concentrating on things, such as reading the newspaper or watching television (PHQ Adolescent also includes...like school work): 0 Moving or speaking so slowly that other people could have noticed. Or the opposite - being so fidgety or restless that you have been moving around a lot more than usual: 0 Thoughts that you would be better off dead, or of hurting yourself in some way: 0 PHQ-9 Total Score: 4 If you checked off any problems, how difficult have these problems made it for you to do your work, take care of things at home, or get along with other people?: Not difficult at all  Depression Treatment Depression Interventions/Treatment : EYV7-0 Score <4 Follow-up Not Indicated     Goals Addressed   None    Visit info / Clinical Intake: Interpreter Needed?: No  Functional Status Activities of Daily Living (to include ambulation/medication): Independent Ambulation: Independent with device- listed below Home Assistive  Devices/Equipment: Wheelchair Medication Administration: Independent Home Management: Independent  Fall Screening Falls in the past year?: 1 Number of falls in past year: 1 Was there an injury with Fall?: 0 Fall Risk Category Calculator: 2 Patient Fall Risk Level: Moderate Fall Risk  Fall Risk Patient at Risk for Falls Due to: History of fall(s); Impaired balance/gait; Orthopedic patient; Impaired mobility Fall risk Follow up: Falls evaluation completed; Education provided  Cognitive Assessment What year is it?: 0 points What month is it?: 0 points Give patient an address phrase to remember (5 components): [address] [place]  About what time is it?: 0 points Count backwards from 20 to 1: 0 points Say the months of the year in reverse: 0 points Repeat the address phrase from earlier: 0 points 6 CIT Score: 0 points  Advance Directives (For Healthcare) Does Patient Have a Medical Advance Directive?: No Would patient like information on creating a medical advance directive?: No - Patient declined        Objective:  Today's Vitals   07/09/24 0858  Weight: 136 lb 8 oz (61.9 kg)  Height: 4' 11.5 (1.511 m)   Body mass index is 27.11 kg/m.  Current Medications (verified) Outpatient Encounter Medications as of 07/09/2024  Medication Sig   acetaminophen  (TYLENOL ) 500 MG tablet Take 1,000 mg by mouth every 6 (six) hours as needed for mild pain (pain score 1-3) or moderate pain (pain score 4-6).   albuterol  (PROVENTIL ) (2.5 MG/3ML) 0.083% nebulizer solution Take 3 mLs (2.5 mg total) by nebulization every 6 (six) hours as needed for wheezing or shortness of breath.   albuterol  (VENTOLIN  HFA) 108 (90 Base) MCG/ACT inhaler Inhale 2 puffs into the lungs every 6 (six) hours as needed.   ARIPiprazole (ABILIFY) 15 MG tablet Take 15 mg by mouth daily.   Armodafinil  150 MG tablet TAKE 1 TABLET BY MOUTH EVERY DAY   busPIRone  (BUSPAR ) 30 MG tablet Take 1 tablet (30 mg total) by  mouth 2 (two) times daily.   celecoxib  (CELEBREX ) 200 MG capsule TAKE 1 CAPSULE BY MOUTH TWICE A DAY   diclofenac  Sodium (VOLTAREN ) 1 % GEL APPLY AS NEEDED 3 TIMES A DAY   docusate sodium (COLACE) 100 MG capsule Take 200 mg by mouth daily.   DULoxetine  (CYMBALTA ) 60 MG capsule TAKE 2 CAPSULES BY MOUTH DAILY   fentaNYL  (DURAGESIC ) 100 MCG/HR Place 1 patch onto the skin every 3 (three) days.   fexofenadine  (ALLEGRA ) 180 MG tablet Take 1 tablet (180 mg total) by mouth daily.   fluticasone -salmeterol (WIXELA INHUB) 250-50 MCG/ACT AEPB INHALE 1 PUFF INTO THE LUNGS IN THE MORNING AND AT BEDTIME.   gabapentin  (NEURONTIN ) 800 MG tablet TAKE 1 TABLET BY MOUTH THREE TIMES A DAY   haloperidol  (HALDOL ) 0.5 MG tablet TAKE 1/2 OF A TABLET (0.25 MG TOTAL) BY MOUTH TWICE A DAY (Patient taking differently: TAKE 1/2 OF A TABLET (0.25 MG TOTAL) BY MOUTH TWICE A DAY)   hydrOXYzine (VISTARIL) 25 MG capsule Take 25 mg by mouth as needed.   Melatonin 10 MG TABS Take 20 mg by mouth at bedtime.   methocarbamol  (ROBAXIN ) 500 MG tablet Take 1 tablet (500 mg total) by mouth every 8 (eight) hours as needed.   Multiple Vitamins-Minerals (HAIR SKIN & NAILS PO) Take 2 tablets by mouth daily.   Multiple Vitamins-Minerals (MULTIVITAMIN WITH MINERALS) tablet Take 2 tablets by mouth daily.   naloxone (NARCAN) nasal spray 4 mg/0.1 mL Place 1 spray into the nose as needed.   omeprazole  (PRILOSEC) 40 MG capsule Take 1 capsule (40 mg total) by mouth 2 (two) times daily.   ondansetron  (ZOFRAN -ODT) 4 MG disintegrating tablet DISSOLVE 1 TABLET IN MOUTH EVERY 8 HOURS FOR NAUSEA AND VOMITING   pimecrolimus (ELIDEL) 1 % cream Apply twice daily to white patches on face   QUEtiapine  (SEROQUEL ) 100 MG tablet Take 1 tablet (100 mg total) by mouth at bedtime.   No facility-administered encounter medications on file as of 07/09/2024.   Hearing/Vision screen No results found. Immunizations and Health Maintenance Health Maintenance  Topic  Date Due   Hepatitis C Screening  Never done   Hepatitis B Vaccines 19-59 Average Risk (1 of 3 - 19+ 3-dose series) Never done   DTaP/Tdap/Td (2 - Td or Tdap) 06/14/2022   Mammogram  05/13/2023   COVID-19 Vaccine (6 - 2025-26 season) 04/15/2024   Medicare Annual Wellness (AWV)  04/19/2025   Colonoscopy  01/12/2027   Cervical Cancer Screening (HPV/Pap Cotest)  05/07/2028   Pneumococcal Vaccine  Completed  Influenza Vaccine  Completed   HIV Screening  Completed   Meningococcal B Vaccine  Aged Out   HPV VACCINES  Discontinued        Assessment/Plan:  This is a routine wellness examination for Loana.  Patient Care Team: Gasper Nancyann BRAVO, MD as PCP - General (Family Medicine) Susen Elsie DEL, MD (Inactive) as Consulting Physician (Pediatric Neurology) [place], NP as Nurse Practitioner (Neurology) [place], [place], LCSW as [facility] [place], [place], [place]  I have personally reviewed and noted the following in the patient's chart:   Medical and social history Use of alcohol, tobacco or illicit drugs  Current medications and supplements including opioid prescriptions. Functional ability and status Nutritional status Physical activity Advanced directives List of other physicians Hospitalizations, surgeries, and ER visits in previous 12 months Vitals Screenings to include cognitive, depression, and falls Referrals and appointments  No orders of the defined types were placed in this encounter.  In addition, I have reviewed and discussed with patient certain preventive protocols, quality metrics, and best practice recommendations. A written personalized care plan for preventive services as well as general preventive health recommendations were provided to patient.   Jhonnie GORMAN Das, LPN   88/74/7974   No follow-ups on file.  After Visit Summary: (In Person-Printed) AVS printed and given to the patient  Nurse Notes: NEEDS TDAP, COVID; MAMMOGRAM  ORDERED; UTD ON COLONOSCOPY  UA WAS NEGATIVE

## 2024-07-10 ENCOUNTER — Other Ambulatory Visit (HOSPITAL_COMMUNITY): Payer: Self-pay

## 2024-07-12 ENCOUNTER — Other Ambulatory Visit (HOSPITAL_COMMUNITY): Payer: Self-pay

## 2024-07-26 DIAGNOSIS — F411 Generalized anxiety disorder: Secondary | ICD-10-CM | POA: Diagnosis not present

## 2024-07-26 DIAGNOSIS — F331 Major depressive disorder, recurrent, moderate: Secondary | ICD-10-CM | POA: Diagnosis not present

## 2024-07-31 DIAGNOSIS — F329 Major depressive disorder, single episode, unspecified: Secondary | ICD-10-CM | POA: Diagnosis not present

## 2024-07-31 DIAGNOSIS — F250 Schizoaffective disorder, bipolar type: Secondary | ICD-10-CM | POA: Diagnosis not present

## 2024-08-06 ENCOUNTER — Other Ambulatory Visit: Payer: Self-pay | Admitting: Family Medicine

## 2024-08-06 DIAGNOSIS — M5136 Other intervertebral disc degeneration, lumbar region with discogenic back pain only: Secondary | ICD-10-CM

## 2024-08-06 NOTE — Telephone Encounter (Signed)
 Copied from CRM #[id number]. Topic: Clinical - Medication Refill >> Aug 06, 2024  2:32 PM Montie POUR wrote: Medication:  fentaNYL  (DURAGESIC ) 100 MCG/HR  Has the patient contacted their pharmacy? Yes (Agent: If no, request that the patient contact the pharmacy for the refill. If patient does not wish to contact the pharmacy document the reason why and proceed with request.) (Agent: If yes, when and what did the pharmacy advise?) Pharmacy needs a new order   This is the patient's preferred pharmacy:  [id number] [place] [place] - [address] [place] [address] Phone: [phone] Fax: [phone]  Is this the correct pharmacy for this prescription? Yes If no, delete pharmacy and type the correct one.   Has the prescription been filled recently? No  Is the patient out of the medication? No - She has 2 left  Has the patient been seen for an appointment in the last year OR does the patient have an upcoming appointment? Yes  Can we respond through MyChart? No  Agent: Please be advised that Rx refills may take up to 3 business days. We ask that you follow-up with your pharmacy.

## 2024-08-07 NOTE — Telephone Encounter (Signed)
 Requested medication (s) are due for refill today: Yes  Requested medication (s) are on the active medication list: Yes  Last refill:  07/08/24  Future visit scheduled: Yes  Notes to clinic:  Unable to refill per protocol, cannot delegate.      Requested Prescriptions  Pending Prescriptions Disp Refills   fentaNYL  (DURAGESIC ) 100 MCG/HR 10 patch 0    Sig: Place 1 patch onto the skin every 3 (three) days.     Not Delegated - Analgesics:  Opioid Agonists Failed - 08/07/2024 12:40 PM      Failed - This refill cannot be delegated      Passed - Urine Drug Screen completed in last 360 days      Passed - Valid encounter within last 3 months    Recent Outpatient Visits           4 months ago Mild persistent asthma with exacerbation   [facility] Gasper Nancyann BRAVO, MD   8 months ago Arthritis   [facility] Gasper Nancyann BRAVO, MD   9 months ago Head injury with loss of consciousness [facility])   [place] [facility] [place], [place], PA-C       Future Appointments             In 2 months Claudene Lehmann, MD [facility]

## 2024-08-09 MED ORDER — FENTANYL 100 MCG/HR TD PT72: 1.0000 | MEDICATED_PATCH | TRANSDERMAL | 0 refills | Status: DC

## 2024-08-16 ENCOUNTER — Other Ambulatory Visit (HOSPITAL_COMMUNITY): Payer: Self-pay

## 2024-09-05 ENCOUNTER — Other Ambulatory Visit: Payer: Self-pay | Admitting: Family Medicine

## 2024-09-05 DIAGNOSIS — M5136 Other intervertebral disc degeneration, lumbar region with discogenic back pain only: Secondary | ICD-10-CM

## 2024-09-05 NOTE — Telephone Encounter (Unsigned)
 Copied from CRM #[id number]. Topic: Clinical - Medication Refill >> Sep 05, 2024  3:23 PM Tiffini S wrote: Medication:  QUEtiapine  (SEROQUEL ) 100 MG tablet, fentaNYL  (DURAGESIC ) 100 MCG/HR   Has the patient contacted their pharmacy? No (Agent: If no, request that the patient contact the pharmacy for the refill. If patient does not wish to contact the pharmacy document the reason why and proceed with request.) (Agent: If yes, when and what did the pharmacy advise?)  This is the patient's preferred pharmacy:  [id number] [place] [place] - [address] [place] [address] Phone: [phone] Fax: [phone]  Is this the correct pharmacy for this prescription? Yes If no, delete pharmacy and type the correct one.   Has the prescription been filled recently? Yes  Is the patient out of the medication? Yes  Has the patient been seen for an appointment in the last year OR does the patient have an upcoming appointment? Yes  Can we respond through MyChart? No,  please call [phone]  Agent: Please be advised that Rx refills may take up to 3 business days. We ask that you follow-up with your pharmacy.

## 2024-09-06 MED ORDER — FENTANYL 100 MCG/HR TD PT72: 1.0000 | MEDICATED_PATCH | TRANSDERMAL | 0 refills | Status: DC

## 2024-09-06 MED ORDER — QUETIAPINE FUMARATE 100 MG PO TABS: 100.0000 mg | ORAL_TABLET | Freq: Every day | ORAL | 4 refills | Status: AC

## 2024-09-11 ENCOUNTER — Encounter: Payer: Self-pay | Admitting: Family Medicine

## 2024-09-11 ENCOUNTER — Ambulatory Visit (INDEPENDENT_AMBULATORY_CARE_PROVIDER_SITE_OTHER): Admitting: Family Medicine

## 2024-09-11 VITALS — BP 119/84 | HR 85 | Resp 16 | Wt 136.0 lb

## 2024-09-11 DIAGNOSIS — M5136 Other intervertebral disc degeneration, lumbar region with discogenic back pain only: Secondary | ICD-10-CM | POA: Diagnosis not present

## 2024-09-11 DIAGNOSIS — J45901 Unspecified asthma with (acute) exacerbation: Secondary | ICD-10-CM | POA: Diagnosis not present

## 2024-09-11 DIAGNOSIS — G43009 Migraine without aura, not intractable, without status migrainosus: Secondary | ICD-10-CM

## 2024-09-11 DIAGNOSIS — G4739 Other sleep apnea: Secondary | ICD-10-CM

## 2024-09-11 DIAGNOSIS — J4520 Mild intermittent asthma, uncomplicated: Secondary | ICD-10-CM | POA: Diagnosis not present

## 2024-09-11 DIAGNOSIS — E041 Nontoxic single thyroid nodule: Secondary | ICD-10-CM

## 2024-09-11 DIAGNOSIS — I341 Nonrheumatic mitral (valve) prolapse: Secondary | ICD-10-CM

## 2024-09-11 DIAGNOSIS — Q796 Ehlers-Danlos syndrome, unspecified: Secondary | ICD-10-CM

## 2024-09-11 DIAGNOSIS — G40909 Epilepsy, unspecified, not intractable, without status epilepticus: Secondary | ICD-10-CM

## 2024-09-11 DIAGNOSIS — G4710 Hypersomnia, unspecified: Secondary | ICD-10-CM | POA: Diagnosis not present

## 2024-09-11 DIAGNOSIS — J40 Bronchitis, not specified as acute or chronic: Secondary | ICD-10-CM

## 2024-09-11 MED ORDER — FLUTICASONE-SALMETEROL 250-50 MCG/ACT IN AEPB: 1.0000 | INHALATION_SPRAY | Freq: Two times a day (BID) | RESPIRATORY_TRACT | 11 refills | Status: AC

## 2024-09-11 MED ORDER — CELECOXIB 200 MG PO CAPS: 200.0000 mg | ORAL_CAPSULE | Freq: Two times a day (BID) | ORAL | 5 refills | Status: AC

## 2024-09-11 MED ORDER — FENTANYL 100 MCG/HR TD PT72: 1.0000 | MEDICATED_PATCH | TRANSDERMAL | 0 refills | Status: AC

## 2024-09-11 MED ORDER — PREDNISONE 10 MG PO TABS: ORAL_TABLET | ORAL | 0 refills | Status: AC

## 2024-09-11 MED ORDER — ARMODAFINIL 150 MG PO TABS: 150.0000 mg | ORAL_TABLET | Freq: Every day | ORAL | 5 refills | Status: AC

## 2024-09-11 MED ORDER — ALBUTEROL SULFATE HFA 108 (90 BASE) MCG/ACT IN AERS: 2.0000 | INHALATION_SPRAY | Freq: Four times a day (QID) | RESPIRATORY_TRACT | 5 refills | Status: AC | PRN

## 2024-09-11 MED ORDER — DOXYCYCLINE HYCLATE 100 MG PO TABS: 100.0000 mg | ORAL_TABLET | Freq: Two times a day (BID) | ORAL | 0 refills | Status: AC

## 2024-09-11 MED ORDER — ALBUTEROL SULFATE (2.5 MG/3ML) 0.083% IN NEBU: 2.5000 mg | INHALATION_SOLUTION | Freq: Four times a day (QID) | RESPIRATORY_TRACT | 1 refills | Status: AC | PRN

## 2024-09-11 NOTE — Progress Notes (Signed)
 "     Established patient visit   Patient: Karen [name]   DOB: April 04, 1976   49 y.o. Female  MRN: [id number] Visit Date: 09/11/2024  Today's healthcare provider: Nancyann Perry, MD   Chief Complaint  Patient presents with   Medication Problem    Medications concerns pt wants to evaluated to see if she has pneumonia ( 10 days ) with cough.   Subjective    Discussed the use of AI scribe software for clinical note transcription with the patient, who gave verbal consent to proceed.  History of Present Illness   Karen [name] is a 49 year old female with asthma who presents with wheezing and respiratory symptoms.  She has been experiencing wheezing, particularly severe at night, for the past ten days. She also has a cough producing yellow sputum and feels short of breath and struggles to breathe. The patient reports no fever, chills, or sweats during this period.  She has been using her inhalers, Albuterol  and Wilate, more frequently than prescribed to manage her symptoms and avoid hospitalization. Her nebulizer is broken and not functional.  In addition to respiratory symptoms, she has a stuffy and runny nose, sinus pressure, and a sore throat. She received a flu shot and a pneumonia shot, but her mother suspects she might have contracted the flu regardless.  She is currently out of several medications, including Celebrex , modafinil , and fentanyl , and her prescriptions are under review by her insurance. Her psychiatrist mistakenly sent her prescriptions to [place] , leading to a delay in receiving her medications.      Medications: Show/hide medication list[1] Review of Systems     Objective    BP 119/84 (BP Location: Left Arm, Patient Position: Sitting, Cuff Size: Normal)   Pulse 85   Resp 16   Wt 136 lb (61.7 kg)   SpO2 96%   BMI 27.01 kg/m   Physical Exam   General: Appearance:    Well developed, well nourished female in no acute distress  Eyes:    PERRL,  conjunctiva/corneas clear, EOM's intact       Lungs:     Diffuse expiratory wheezes, no rales, good air movement, respirations unlabored  Heart:    Normal heart rate. Normal rhythm. No murmurs, rubs, or gallops.    MS:   All extremities are intact.    Neurologic:   Awake, alert, oriented x 3. No apparent focal neurological defect.         Assessment & Plan       Asthma with acute exacerbation and bronchitis Acute asthma exacerbation with bronchitis, wheezing suggests bronchitis over pneumonia. Overuse of inhalers noted, nebulizer unavailable. - Prescribed doxycycline  for bronchitis and potential pneumonia. - Prescribed prednisone  to aid breathing. - Ordered new nebulizer through [facility]. - Refilled asthma medications including Wixela and Proventil .  Chronic pain syndrome Management complicated by insurance issues with modafinil  and Celebrex . Fentanyl  supply critically low. - Sent prescriptions for Celebrex  and fentanyl  to pharmacy.  Mixed sleep apnea Managed with CPAP. - Continue CPAP therapy.   Hypersomnia Refill  - Armodafinil  150 MG tablet; Take 1 tablet (150 mg total) by mouth daily.  Dispense: 30 tablet; Refill: 5   Degeneration of intervertebral disc of lumbar region with discogenic back pain  Refill  celecoxib  (CELEBREX ) 200 MG capsule; Take 1 capsule (200 mg total) by mouth 2 (two) times daily.  Dispense: 60 capsule; Refill: 5 - fentaNYL  (DURAGESIC ) 100 MCG/HR; Place 1 patch onto the skin every 3 (three)  days.  Dispense: 10 patch; Refill: 0         Nancyann Perry, MD  [facility] [phone] (phone) [phone] (fax)  [facility]     [1]  Outpatient Medications Prior to Visit  Medication Sig   acetaminophen  (TYLENOL ) 500 MG tablet Take 1,000 mg by mouth every 6 (six) hours as needed for mild pain (pain score 1-3) or moderate pain (pain score 4-6).   ARIPiprazole (ABILIFY) 15 MG tablet Take 15 mg by mouth daily.    busPIRone  (BUSPAR ) 30 MG tablet Take 1 tablet (30 mg total) by mouth 2 (two) times daily.   diclofenac  Sodium (VOLTAREN ) 1 % GEL APPLY AS NEEDED 3 TIMES A DAY   docusate sodium (COLACE) 100 MG capsule Take 200 mg by mouth daily.   DULoxetine  (CYMBALTA ) 60 MG capsule TAKE 2 CAPSULES BY MOUTH DAILY   fexofenadine  (ALLEGRA ) 180 MG tablet Take 1 tablet (180 mg total) by mouth daily.   gabapentin  (NEURONTIN ) 800 MG tablet TAKE 1 TABLET BY MOUTH THREE TIMES A DAY   haloperidol  (HALDOL ) 0.5 MG tablet TAKE 1/2 OF A TABLET (0.25 MG TOTAL) BY MOUTH TWICE A DAY   hydrOXYzine (VISTARIL) 25 MG capsule Take 25 mg by mouth as needed.   Melatonin 10 MG TABS Take 20 mg by mouth at bedtime.   methocarbamol  (ROBAXIN ) 500 MG tablet Take 1 tablet (500 mg total) by mouth every 8 (eight) hours as needed.   Multiple Vitamins-Minerals (HAIR SKIN & NAILS PO) Take 2 tablets by mouth daily.   Multiple Vitamins-Minerals (MULTIVITAMIN WITH MINERALS) tablet Take 2 tablets by mouth daily.   naloxone (NARCAN) nasal spray 4 mg/0.1 mL Place 1 spray into the nose as needed.   omeprazole  (PRILOSEC) 40 MG capsule Take 1 capsule (40 mg total) by mouth 2 (two) times daily.   ondansetron  (ZOFRAN -ODT) 4 MG disintegrating tablet DISSOLVE 1 TABLET IN MOUTH EVERY 8 HOURS FOR NAUSEA AND VOMITING   pimecrolimus (ELIDEL) 1 % cream Apply twice daily to white patches on face   QUEtiapine  (SEROQUEL ) 100 MG tablet Take 1 tablet (100 mg total) by mouth at bedtime.   [DISCONTINUED] albuterol  (PROVENTIL ) (2.5 MG/3ML) 0.083% nebulizer solution Take 3 mLs (2.5 mg total) by nebulization every 6 (six) hours as needed for wheezing or shortness of breath.   [DISCONTINUED] albuterol  (VENTOLIN  HFA) 108 (90 Base) MCG/ACT inhaler Inhale 2 puffs into the lungs every 6 (six) hours as needed.   [DISCONTINUED] Armodafinil  150 MG tablet TAKE 1 TABLET BY MOUTH EVERY DAY   [DISCONTINUED] celecoxib  (CELEBREX ) 200 MG capsule TAKE 1 CAPSULE BY MOUTH TWICE A DAY    [DISCONTINUED] fentaNYL  (DURAGESIC ) 100 MCG/HR Place 1 patch onto the skin every 3 (three) days.   [DISCONTINUED] fluticasone -salmeterol (WIXELA INHUB) 250-50 MCG/ACT AEPB INHALE 1 PUFF INTO THE LUNGS IN THE MORNING AND AT BEDTIME.   No facility-administered medications prior to visit.   "

## 2024-09-12 ENCOUNTER — Telehealth: Payer: Self-pay

## 2024-09-12 NOTE — Telephone Encounter (Signed)
 Order started online at the following website [email or web address]  Supporting documents and signed order faxed to [facility]  Phone: [phone] Fax: [phone]

## 2024-10-07 ENCOUNTER — Ambulatory Visit: Admitting: Dermatology

## 2025-07-15 ENCOUNTER — Ambulatory Visit
